# Patient Record
Sex: Female | Born: 1960 | Race: White | Hispanic: No | Marital: Single
Health system: Midwestern US, Academic
[De-identification: ages and names within clinical notes are randomized; demographics above are authoritative.]

## PROBLEM LIST (undated history)

## (undated) DIAGNOSIS — R011 Cardiac murmur, unspecified: Secondary | ICD-10-CM

## (undated) DIAGNOSIS — G4730 Sleep apnea, unspecified: Secondary | ICD-10-CM

## (undated) DIAGNOSIS — M1990 Unspecified osteoarthritis, unspecified site: Secondary | ICD-10-CM

## (undated) DIAGNOSIS — T8859XA Other complications of anesthesia, initial encounter: Secondary | ICD-10-CM

## (undated) DIAGNOSIS — K219 Gastro-esophageal reflux disease without esophagitis: Secondary | ICD-10-CM

## (undated) DIAGNOSIS — Z800 Family history of malignant neoplasm of digestive organs: Secondary | ICD-10-CM

## (undated) DIAGNOSIS — F419 Anxiety disorder, unspecified: Secondary | ICD-10-CM

## (undated) DIAGNOSIS — Z853 Personal history of malignant neoplasm of breast: Secondary | ICD-10-CM

## (undated) DIAGNOSIS — I499 Cardiac arrhythmia, unspecified: Secondary | ICD-10-CM

## (undated) DIAGNOSIS — I341 Nonrheumatic mitral (valve) prolapse: Secondary | ICD-10-CM

## (undated) DIAGNOSIS — E119 Type 2 diabetes mellitus without complications: Secondary | ICD-10-CM

## (undated) DIAGNOSIS — D649 Anemia, unspecified: Secondary | ICD-10-CM

## (undated) DIAGNOSIS — B977 Papillomavirus as the cause of diseases classified elsewhere: Secondary | ICD-10-CM

## (undated) DIAGNOSIS — Z803 Family history of malignant neoplasm of breast: Secondary | ICD-10-CM

## (undated) DIAGNOSIS — I10 Essential (primary) hypertension: Secondary | ICD-10-CM

## (undated) DIAGNOSIS — A64 Unspecified sexually transmitted disease: Secondary | ICD-10-CM

## (undated) DIAGNOSIS — D219 Benign neoplasm of connective and other soft tissue, unspecified: Secondary | ICD-10-CM

## (undated) DIAGNOSIS — D689 Coagulation defect, unspecified: Secondary | ICD-10-CM

## (undated) DIAGNOSIS — M797 Fibromyalgia: Secondary | ICD-10-CM

## (undated) DIAGNOSIS — K589 Irritable bowel syndrome without diarrhea: Secondary | ICD-10-CM

## (undated) DIAGNOSIS — N3090 Cystitis, unspecified without hematuria: Secondary | ICD-10-CM

## (undated) DIAGNOSIS — E039 Hypothyroidism, unspecified: Secondary | ICD-10-CM

## (undated) DIAGNOSIS — K279 Peptic ulcer, site unspecified, unspecified as acute or chronic, without hemorrhage or perforation: Secondary | ICD-10-CM

## (undated) DIAGNOSIS — G90521 Complex regional pain syndrome I of right lower limb: Secondary | ICD-10-CM

## (undated) DIAGNOSIS — E785 Hyperlipidemia, unspecified: Secondary | ICD-10-CM

## (undated) DIAGNOSIS — I82409 Acute embolism and thrombosis of unspecified deep veins of unspecified lower extremity: Secondary | ICD-10-CM

## (undated) DIAGNOSIS — A609 Anogenital herpesviral infection, unspecified: Secondary | ICD-10-CM

## (undated) DIAGNOSIS — E079 Disorder of thyroid, unspecified: Secondary | ICD-10-CM

## (undated) DIAGNOSIS — J45909 Unspecified asthma, uncomplicated: Secondary | ICD-10-CM

## (undated) DIAGNOSIS — C50919 Malignant neoplasm of unspecified site of unspecified female breast: Secondary | ICD-10-CM

## (undated) DIAGNOSIS — K635 Polyp of colon: Secondary | ICD-10-CM

## (undated) HISTORY — PX: DENTAL SURGERY: SHX609

## (undated) HISTORY — PX: TOE SURGERY: SHX1073

## (undated) HISTORY — DX: Anxiety disorder, unspecified: F41.9

## (undated) HISTORY — DX: Personal history of malignant neoplasm of breast: Z85.3

## (undated) HISTORY — DX: Family history of malignant neoplasm of digestive organs: Z80.0

## (undated) HISTORY — DX: Cardiac murmur, unspecified: R01.1

## (undated) HISTORY — DX: Peptic ulcer, site unspecified, unspecified as acute or chronic, without hemorrhage or perforation: K27.9

## (undated) HISTORY — PX: FOOT SURGERY: SHX648

## (undated) HISTORY — DX: Papillomavirus as the cause of diseases classified elsewhere: B97.7

## (undated) HISTORY — PX: COLONOSCOPY: SHX174

## (undated) HISTORY — DX: Complex regional pain syndrome i of right lower limb: G90.521

## (undated) HISTORY — DX: Sleep apnea, unspecified: G47.30

## (undated) HISTORY — DX: Cystitis, unspecified without hematuria: N30.90

## (undated) HISTORY — DX: Anogenital herpesviral infection, unspecified: A60.9

## (undated) HISTORY — DX: Irritable bowel syndrome, unspecified: K58.9

## (undated) HISTORY — DX: Coagulation defect, unspecified: D68.9

## (undated) HISTORY — PX: ESOPHAGOGASTRODUODENOSCOPY: SHX1529

## (undated) HISTORY — DX: Fibromyalgia: M79.7

## (undated) HISTORY — DX: Unspecified osteoarthritis, unspecified site: M19.90

## (undated) HISTORY — DX: Benign neoplasm of connective and other soft tissue, unspecified: D21.9

## (undated) HISTORY — DX: Hyperlipidemia, unspecified: E78.5

## (undated) HISTORY — PX: ABDOMINAL HYSTERECTOMY: SHX81

## (undated) HISTORY — PX: KNEE SURGERY: SHX244

## (undated) HISTORY — PX: BREAST SURGERY: SHX581

## (undated) HISTORY — DX: Family history of malignant neoplasm of breast: Z80.3

## (undated) HISTORY — DX: Polyp of colon: K63.5

## (undated) LAB — HM PAP SMEAR: HM Pap smear: NORMAL

## (undated) LAB — HM COLONOSCOPY

## (undated) LAB — HM DM FOOT MONOFILAMENT AND PULSE: hm dm foot monofilament and pulse: NORMAL

## (undated) LAB — HM DEXA SCAN

## (undated) LAB — HM MAMMOGRAPHY

---
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
## 2004-10-29 LAB — IRON STUDIES
% Iron Saturation: 11 % (ref 20–55)
Iron: 38 ug/dL (ref 35–175)
TIBC: 356 ug/dL (ref 245–400)

## 2004-10-29 LAB — BASIC METABOLIC PANEL
Anion Gap: 13 (ref 3–16)
BUN: 11 mg/dL (ref 7–25)
CO2: 22 mmol/L (ref 21–33)
Calcium: 9.4 mg/dL (ref 8.5–10.4)
Chloride: 105 mmol/L (ref 98–110)
Creatinine: 0.8 mg/dL (ref 0.5–1.2)
Glucose: 108 mg/dL (ref 65–99)
Potassium: 4 mmol/L (ref 3.5–5.3)
Sodium: 140 mmol/L (ref 135–146)

## 2004-10-29 LAB — HEPATIC FUNCTION PANEL
ALT: 37 units/L (ref 3–40)
AST: 26 units/L (ref 3–35)
Albumin: 4.4 g/dL (ref 3.5–4.9)
Alkaline Phosphatase: 70 units/L (ref 20–125)
Bilirubin, Direct: 0.1 mg/dL (ref 0.0–0.3)
Total Bilirubin: 0.8 mg/dL (ref 0.2–1.3)
Total Protein: 7.5 g/dL (ref 6.0–8.3)

## 2004-10-29 LAB — CBC
Hematocrit: 40.2 % (ref 35.0–45.0)
Hemoglobin: 13.3 g/dL (ref 12.0–16.0)
MCH: 31.1 pg (ref 27.0–34.0)
MCV: 93.9 fL (ref 81.0–103.0)
MPV: 10.9 fL (ref 9.5–12.5)
Platelets: 205 10*3/uL (ref 140–400)
RBC: 4.28 10*6/uL (ref 3.90–5.40)
RDW: 12.8 % (ref 11.5–14.5)
WBC: 6.8 10*3/uL (ref 4.5–11.0)

## 2004-10-29 LAB — TSH: TSH: 1.47 m[IU]/mL (ref 0.35–5.50)

## 2004-10-29 LAB — LIPID PANEL
Cholesterol, Total: 148 mg/dL (ref 0–200)
HDL: 47 mg/dL (ref 40–180)
LDL Cholesterol: 69 mg/dL (ref 0–130)
Triglycerides: 158 mg/dL (ref 0–150)

## 2004-10-29 NOTE — Unmapped (Signed)
Signed by   LinkLogic on 10/29/2004 at 23:28:23  Patient: Wanda Rowe  Note: All result statuses are Final unless otherwise noted.    Tests: (1) BASIC METABOLIC PANEL, FASTING (EP1F)    Sodium                    140 mmol/L                  135-146    Potassium                 4.0 mmol/L                  3.5-5.3    Chloride                  105 mmol/L                  98-110    CO2                       22 mmol/L                   21-33    Anion Gap                 13 mEq/L                    3-16    BUN                       11 mg/dL                    4-40    Creatinine                0.8 mg/dL                   1.0-2.7    Glucose              [H]  108 mg/dL                   25-36    Calcium                   9.4 mg/dL                   6.4-40.3  ! GFR MDRD Af Amer          101 See Note      GFR is estimated using Creatinine, age, gender and race. Patient's values   should be interpreted as a trend.      Below 90 ml/min/1.56m2, the patient may have renal disease.       For additional information:      www.[email or web address].org and [email or web address]    ! GFR MDRD Non Af Amer      83 See Note      GFR is estimated using Creatinine, age, gender and race. Patient's values   should be interpreted as a trend.      Between 30 and 90 ml/min/1.65m2, clinical correlation is needed.     For additional information:     www.kidney.org and [email or web address]    ! 1/Creatinine              1.25    Note: An exclamation mark (!) indicates a result that was not dispersed into   the flowsheet.  Document Creation Date: 10/29/2004 11:28 PM  _______________________________________________________________________    (1) Order result status: Final  Collection or observation date-time: 10/29/2004 09:38  Requested date-time: 10/29/2004 09:38  Receipt date-time: 10/29/2004 19:33  Reported date-time: 10/29/2004 23:30  Referring Physician:    Ordering Physician: Latrelle Bazar (HOVERMAK)  Specimen Source: S&SERUM     SST REFRIG&SST (Refrig)  Source:  Butler [place] Order Number: [id number]  Lab site: [facility]      [address]      [place] [address]  [phone]

## 2004-10-29 NOTE — Unmapped (Signed)
Signed by   LinkLogic on 10/29/2004 at 23:28:22  Patient: Wanda Rowe  Note: All result statuses are Final unless otherwise noted.    Tests: (1) IRON STUDIES (IS)    Iron                      38 mcg/dL                   95-621    TIBC                      356 ug/dL                   308-657    %SAT                 [L]  11 %                        20-55    Note: An exclamation mark (!) indicates a result that was not dispersed into   the flowsheet.  Document Creation Date: 10/29/2004 11:28 PM  _______________________________________________________________________    (1) Order result status: Final  Collection or observation date-time: 10/29/2004 09:38  Requested date-time: 10/29/2004 09:38  Receipt date-time: 10/29/2004 19:33  Reported date-time: 10/29/2004 23:30  Referring Physician:    Ordering Physician: Steaven Wholey (HOVERMAK)  Specimen Source: S&SERUM     SST REFRIG&SST (Refrig)  Source: Butler Denmark Order Number: [id number]  Lab site: [facility]      [address]      [place] [address]  [phone]

## 2004-10-29 NOTE — Unmapped (Signed)
Signed by   LinkLogic on 10/29/2004 at 23:28:20  Patient: Wanda Rowe  Note: All result statuses are Final unless otherwise noted.    Tests: (1) HEPATIC FUNCTION PANEL (LIVP)    BILI, Total               0.8 mg/dL                   7.8-2.9    BILI, Direct              0.1 mg/dL                   5.6-2.1    AST (SGOT)                26 U/L                      3-35    ALT (SGPT)                37 U/L                      3-40    Alk Phosphatase           70 U/L                      20-125    Protein, Total            7.5 g/dL                    3.0-8.6    Albumin                   4.4 g/dL                    5.7-8.4    Note: An exclamation mark (!) indicates a result that was not dispersed into   the flowsheet.  Document Creation Date: 10/29/2004 11:28 PM  _______________________________________________________________________    (1) Order result status: Final  Collection or observation date-time: 10/29/2004 09:38  Requested date-time: 10/29/2004 09:38  Receipt date-time: 10/29/2004 19:33  Reported date-time: 10/29/2004 23:30  Referring Physician:    Ordering Physician: Tyson Masin (HOVERMAK)  Specimen Source: S&SERUM     SST REFRIG&SST (Refrig)  Source: Butler Denmark Order Number: [id number]  Lab site: [facility]      [address]      [place] [address]  [phone]

## 2004-10-29 NOTE — Unmapped (Signed)
Signed by   LinkLogic on 10/30/2004 at 01:44:03  Patient: Wanda Rowe  Note: All result statuses are Final unless otherwise noted.    Tests: (1) THYROID STIMULATING HORMONE (TSH)    TSH                       1.47 MIU/mL                 0.35-5.50    Note: An exclamation mark (!) indicates a result that was not dispersed into   the flowsheet.  Document Creation Date: 10/30/2004 1:44 AM  _______________________________________________________________________    (1) Order result status: Final  Collection or observation date-time: 10/29/2004 09:38  Requested date-time: 10/29/2004 09:38  Receipt date-time: 10/29/2004 19:33  Reported date-time: 10/30/2004 01:46  Referring Physician:    Ordering Physician: Darlen Gledhill (HOVERMAK)  Specimen Source: S&SERUM     SST REFRIG&SST (Refrig)  Source: Butler Denmark Order Number: [id number]  Lab site: [facility]      [address]      [place] [address]  [phone]

## 2004-10-29 NOTE — Unmapped (Signed)
Signed by   LinkLogic on 10/29/2004 at 23:28:21  Patient: Wanda Rowe  Note: All result statuses are Final unless otherwise noted.    Tests: (1) LIPID PROFILE (FATS)    Cholesterol               148 mg/dL                   2-725    Triglyceride         [H]  158 mg/dL                   3-664    HDL                       47 mg/dL                    40-347    LDL, calc                 69 mg/dL                    4-259    Note: An exclamation mark (!) indicates a result that was not dispersed into   the flowsheet.  Document Creation Date: 10/29/2004 11:28 PM  _______________________________________________________________________    (1) Order result status: Final  Collection or observation date-time: 10/29/2004 09:38  Requested date-time: 10/29/2004 09:38  Receipt date-time: 10/29/2004 19:33  Reported date-time: 10/29/2004 23:30  Referring Physician:    Ordering Physician: Karlynn Furrow (HOVERMAK)  Specimen Source: S&SERUM     SST REFRIG&SST (Refrig)  Source: Butler Denmark Order Number: [id number]  Lab site: [facility]      [address]      [place] [address]  [phone]

## 2004-10-29 NOTE — Unmapped (Signed)
Signed by   LinkLogic on 10/29/2004 at 16:43:14  Patient: Wanda Rowe  Note: All result statuses are Final unless otherwise noted.    Tests: (1) CBC (CBC)    WBC                       6.8 10*3/uL                 4.5-11.0    RBC                       4.28 10*6/uL                3.90-5.40    Hgb                       13.3 g/dL                   84.1-32.4    HCT                       40.2 %                      35.0-45.0    MCV                       93.9 fL                     81.0-103.0    MCH                       31.1 pg                     27.0-34.0    MCHC                      33.1 g/dL                   40.1-02.7    RDW                       12.8 %                      11.5-14.5    Platelet Count            205 10*3/uL                 140-400    MPV                       10.9 fL                     9.5-12.5    Note: An exclamation mark (!) indicates a result that was not dispersed into   the flowsheet.  Document Creation Date: 10/29/2004 4:43 PM  _______________________________________________________________________    (1) Order result status: Final  Collection or observation date-time: 10/29/2004 09:38  Requested date-time: 10/29/2004 09:38  Receipt date-time: 10/29/2004 14:11  Reported date-time: 10/29/2004 16:45  Referring Physician:    Ordering Physician: Artis Buechele (HOVERMAK)  Specimen Source: WB&WHOLE BLOOD     LV5  REFRIG&LAV  (Refrig)  Source: Butler Denmark Order Number: [id number]  Lab site: [facility]      [address]  [place] [address]  [phone]      -----------------    The following results were not dispersed to the flowsheet  because of errors during the import process:      MCHC, 33.1 g/dL, (F)

## 2005-06-16 LAB — HEPATIC FUNCTION PANEL
A/G Ratio: 1.5 (ref 1.0–2.1)
ALT: 36 units/L (ref 6–40)
AST: 31 units/L (ref 10–30)
Albumin: 4.6 g/dL (ref 3.6–5.1)
Alkaline Phosphatase: 67 units/L (ref 33–115)
Bilirubin, Direct: 0.1 mg/dL (ref <=0.2)
Bilirubin, Indirect: 0.6 mg/dL (ref 0.2–1.2)
Globulin, Total: 3 g/dL (ref 2.2–3.9)
Total Bilirubin: 0.7 mg/dL (ref 0.2–1.2)
Total Protein: 7.6 g/dL (ref 6.2–8.3)

## 2005-06-16 LAB — LIPID PANEL
Chol/HDL Ratio: 3.7 (ref <=5.0)
Cholesterol, Total: 179 mg/dL (ref 125–200)
HDL: 48 mg/dL (ref >=40)
LDL Cholesterol: 98 mg/dL (ref <130)
Triglycerides: 164 mg/dL (ref <150)

## 2005-06-16 LAB — BASIC METABOLIC PANEL
BUN/Creatinine Ratio: 16 (ref 6–22)
BUN: 13 mg/dL (ref 7–25)
CO2: 28 mmol/L (ref 21–33)
Calcium: 9.5 mg/dL (ref 8.6–10.2)
Chloride: 102 mmol/L (ref 98–110)
Creatinine: 0.8 mg/dL (ref 0.5–1.2)
GFR MDRD Non Af Amer: >60 mL/min (ref >=60)
Glucose: 107 mg/dL — ABNORMAL HIGH (ref 65–99)
Potassium: 4 mmol/L (ref 3.5–5.3)
Sodium: 140 mmol/L (ref 135–146)

## 2005-06-16 LAB — CK, CARDIAC ENZYME: Total CK: 113 units/L (ref <=165)

## 2005-06-16 LAB — INSULIN: Insulin: 14 microintl units/mL (ref <17)

## 2005-06-16 NOTE — Unmapped (Signed)
Signed by   LinkLogic on 06/18/2005 at 07:21:32  Patient: Wanda Rowe  Note: All result statuses are Final unless otherwise noted.    Tests: (1) CARDIO CRP ([id number])  ! CARDIO CRP           [H]  26.0 mg/L             PERSISTENT ELEVATIONS MAY REPRESENT NON-CARDIOVASCULAR       INFLAMMATION.             FOR AGES > 17 YEARS:             CCRP MG/L    RISK ACCORDING TO AHA/CDC GUIDELINES        ---------    ------------------------------------      <1.0         LOW CARDIOVASCULAR RISK      1.0-3.0      AVERAGE CARDIOVASCULAR RISK      3.1-10.0     HIGH CARDIOVASCULAR RISK      >10.0        PERSISTENT ELEVATIONS MAY REPRESENT                    NON-CARDIOVASCULAR INFLAMMATION           Note: An exclamation mark (!) indicates a result that was not dispersed into   the flowsheet.  Document Creation Date: 06/18/2005 7:21 AM  _______________________________________________________________________    (1) Order result status: Final  Collection or observation date-time: 06/16/2005 13:07  Requested date-time:   Receipt date-time: 06/16/2005 23:37  Reported date-time: 06/18/2005 07:00  Referring Physician:    Ordering Physician: Lauriann Milillo (HOVERMAK)  Specimen Source: S  Source: Lucien Mons Order Number: [id number]  Lab site: [facility] [place]      [address]      [place]  [place]  [address]

## 2005-06-16 NOTE — Unmapped (Signed)
Signed by   LinkLogic on 06/18/2005 at 07:21:29  Patient: Wanda Rowe  Note: All result statuses are Final unless otherwise noted.    Tests: (1) BASIC METABOLIC PANEL W/EGFR (QDL-10165)    GLUCOSE              [H]  107 mg/dL                   78-29                  FASTING REFERENCE INTERVAL    UREA NITROGEN (BUN)       13 mg/dL                    5-62    CREATININE                0.8 mg/dL                   1.3-0.8    GFR ESTIMATED             >60 mL/min/1.12m2           > OR = 60      IF THE PATIENT IS AFRICAN-AMERICAN, PLEASE MULTIPLY      THIS RESULT BY 1.21. THIS RESULT HAS BEEN CALCULATED      ASSUMING THE PATIENT IS NON-AFRICAN AMERICAN.    BUN/CREATININE RATIO (calc)                              16                          6-22    SODIUM                    140 mmol/L                  135-146    POTASSIUM                 4.0 mmol/L                  3.5-5.3    CHLORIDE                  102 mmol/L                  98-110    CARBON DIOXIDE            28 mmol/L                   21-33    CALCIUM                   9.5 mg/dL                   6.5-78.4    Note: An exclamation mark (!) indicates a result that was not dispersed into   the flowsheet.  Document Creation Date: 06/18/2005 7:21 AM  _______________________________________________________________________    (1) Order result status: Final  Collection or observation date-time: 06/16/2005 13:07  Requested date-time:   Receipt date-time: 06/16/2005 23:37  Reported date-time: 06/18/2005 07:00  Referring Physician:    Ordering Physician: Braxden Lovering (HOVERMAK)  Specimen Source: S  Source: Lucien Mons Order Number: [id number]  Lab site: [facility] [place]      [address]    [place]  [address]

## 2005-06-16 NOTE — Unmapped (Signed)
Signed by   LinkLogic on 06/18/2005 at 07:21:30  Patient: Wanda Rowe  Note: All result statuses are Final unless otherwise noted.    Tests: (1) HEPATIC FUNCTION PANEL (QDL-10256)    PROTEIN, TOTAL            7.6 g/dL                    4.6-9.6    ALBUMIN                   4.6 g/dL                    2.9-5.2    GLOBULIN (calc)           3.0 g/dL                    8.4-1.3   ALBUMIN/GLOBULIN RATIO (calc)                              1.5                         1.0-2.1    BILIRUBIN, TOTAL          0.7 mg/dL                   2.4-4.0    BILIRUBIN, DIRECT         0.1 mg/dL                   < OR = 0.2   BILIRUBIN, INDIRECT (calc)                              0.6 mg/dL                   1.0-2.7    ALKALINE PHOSPHATASE      67 U/L                      33-115    AST                  [H]  31 U/L                      10-30    ALT                       36 U/L                      6-40    Note: An exclamation mark (!) indicates a result that was not dispersed into   the flowsheet.  Document Creation Date: 06/18/2005 7:21 AM  _______________________________________________________________________    (1) Order result status: Final  Collection or observation date-time: 06/16/2005 13:07  Requested date-time:   Receipt date-time: 06/16/2005 23:37  Reported date-time: 06/18/2005 07:00  Referring Physician:    Ordering Physician: Trenten Watchman (HOVERMAK)  Specimen Source: S  Source: Lucien Mons Order Number: [id number]  Lab site: [facility] [place]      [address]      [place]  [place]  [address]

## 2005-06-16 NOTE — Unmapped (Signed)
Signed by   LinkLogic on 06/18/2005 at 07:21:33  Patient: Wanda Rowe  Note: All result statuses are Final unless otherwise noted.    Tests: (1) INSULIN (QDL-561)    INSULIN                   14 uIU/mL                   <17    Note: An exclamation mark (!) indicates a result that was not dispersed into   the flowsheet.  Document Creation Date: 06/18/2005 7:21 AM  _______________________________________________________________________    (1) Order result status: Final  Collection or observation date-time: 06/16/2005 13:07  Requested date-time:   Receipt date-time: 06/16/2005 23:37  Reported date-time: 06/18/2005 07:00  Referring Physician:    Ordering Physician: Salimah Martinovich (HOVERMAK)  Specimen Source: S  Source: Lucien Mons Order Number: [id number]  Lab site: [facility] [place]      [address]      [place]  [place]  [address]

## 2005-06-16 NOTE — Unmapped (Signed)
Signed by   LinkLogic on 06/18/2005 at 07:21:28  Patient: Wanda Rowe  Note: All result statuses are Final unless otherwise noted.    Tests: (1) LIPID PANEL (QDL-7600)    TRIGLYCERIDES        [H]  164 mg/dL                   <956    CHOLESTEROL, TOTAL        179 mg/dL                   387-564    HDL CHOLESTEROL           48 mg/dL                    > OR = 40   LDL-CHOLESTEROL (calc)                              98 mg/dL                    <332             DESIRABLE RANGE <100 MG/DL FOR PATIENTS WITH CHD OR      DIABETES AND <70 MG/DL FOR DIABETIC PATIENTS WITH      KNOWN HEART DISEASE.          CHOL/HDLC RATIO (calc)                              3.7                         < OR = 5.0    Note: An exclamation mark (!) indicates a result that was not dispersed into   the flowsheet.  Document Creation Date: 06/18/2005 7:21 AM  _______________________________________________________________________    (1) Order result status: Final  Collection or observation date-time: 06/16/2005 13:07  Requested date-time:   Receipt date-time: 06/16/2005 23:37  Reported date-time: 06/18/2005 07:00  Referring Physician:    Ordering Physician: Ellason Segar (HOVERMAK)  Specimen Source: S  Source: Lucien Mons Order Number: [id number]  Lab site: [facility] [place]      [address]      [place]  [place]  [address]

## 2005-06-16 NOTE — Unmapped (Signed)
Signed by   LinkLogic on 06/18/2005 at 07:21:31  Patient: Wanda Rowe  Note: All result statuses are Final unless otherwise noted.    Tests: (1) CREATINE KINASE, TOTAL (QDL-374)   CREATINE KINASE, TOTAL                              113 U/L                     < OR = 165    Note: An exclamation mark (!) indicates a result that was not dispersed into   the flowsheet.  Document Creation Date: 06/18/2005 7:21 AM  _______________________________________________________________________    (1) Order result status: Final  Collection or observation date-time: 06/16/2005 13:07  Requested date-time:   Receipt date-time: 06/16/2005 23:37  Reported date-time: 06/18/2005 07:00  Referring Physician:    Ordering Physician: Lexandra Rettke (HOVERMAK)  Specimen Source: S  Source: Lucien Mons Order Number: [id number]  Lab site: [facility] [place]      [address]      [place]    [address]

## 2005-08-24 NOTE — Unmapped (Signed)
Signed by Epifanio Lesches MA on 08/24/2005 at 16:26:06    Phone Note   Patient Call  Call back at Work Phone: [phone]  Caller: patient  Department: IM -Gastroenterology  Call for: [facility]    Summary of Call: NEED TO SCHED ENDOSCOPY....................     Initial call taken by: Verlene Mayer,  August 24, 2005 10:56 AM    This department is not currently using the EMR system. Please see the paper chart for this patient for the response to this phone message.      Follow-up for Phone Call   left message to call us back  Follow-up by: Epifanio Lesches MA,  August 24, 2005 4:26 PM

## 2005-09-29 ENCOUNTER — Inpatient Hospital Stay

## 2005-09-29 NOTE — Unmapped (Signed)
[facility] PATIENT NAMEKOREE, Wanda Rowe                       MR #:  [id number] DATE OF BIRTH:  Mar 11, 1960                           ACCOUNT #:  [id number] PHYSICIAN:      Lurene Shadow, M.D.           ROOM #: SERVICE:        Internal   Medicine/Digestive Disease NURSING UNIT: DICTATED BY:    Lurene Shadow,   M.D.           [facility]:  S PROCEDURE DATE: 09/29/2005                         ADMIT   DATE:  09/29/2005                                                      DISCHARGE DATE:                                ENDOSCOPY REPORT HISTORY:  This   is a 45 year old woman with a history of hyperplastic gastric polyps   including a large antral polyp that was removed in [place] a few years ago   that revealed some focal high grade dysplasia.  After extensive discussion in   a recent office visit regarding risk for hyperplastic polyps and adenomatous   transformation usually occurring in the context of a chronic gastritis, we   decided to proceed with upper endoscopy to evaluate for any residual polyps as   well as underlying gastritis. ANESTHESIA:  Sedation was administered using 50   mg of IV Benadryl 5 mg of IV Midazolam and 75 mcg of IV fentanyl. DETAILS OF   PROCEDURE:  A gastroscope was passed from the mouth as far as the second   portion of the duodenum.  The esophagus is normal.  The stomach has mild   diffuse gastritis and biopsies were taken to rule out H. pylori versus   chemical gastropathy.  A 3 mm polyp was noted in the fundus which was removed   with forceps.  Also, for histopathologic evaluation.  There is some scar in   the antrum corresponding to a previous polypectomy.  The proximal duodenum is   normal and the major papilla appears normal.  I will mention that at the   beginning of the case gastric secretions were aspirated for pH testing and pH   evaluation using ___ paper is approximately 3, arguing against pernicious   anemia. IMPRESSION: 1.   Mild  gastritis with one tiny polyp.  pH is 3 biopsy   results are pending.    Will plan to see the patient back in the office.                                         ________________________________________   NS/jlm  ____ D:  09/29/2005 10:11                    Lurene Shadow, M.D. T:  09/29/2005 23:00 Job #:  [id number] c:   Bani K.   Hovermale, M.D.                                ENDOSCOPY REPORT                                         COPY                   Page    1 of 1 NS/jlm                                  ____ D:  09/29/2005 10:11                  Lurene Shadow, M.D. T:  09/29/2005 23:00 Job #:  [id number] c:   Salina K. Hovermale,   M.D.                                ENDOSCOPY REPORT                                         COPY                   Page    1 of 1

## 2005-09-29 NOTE — Unmapped (Signed)
Signed by   LinkLogic on 09/29/2005 at 23:04:05  Patient: Wanda Rowe  Note: All result statuses are Final unless otherwise noted.    Tests: (1)  (MR)    Order Note:                                 [facility]     PATIENT NAMEKHAMORA, [name]                       MR #:  [id number]  DATE OF BIRTH:  [birth year]                         ACCOUNT #:  [id number]  PHYSICIAN:      Lurene Shadow, M.D.           ROOM #:  SERVICE:        Internal Medicine/Digestive Disease NURSING UNIT:  DICTATED BY:    Lurene Shadow, M.D.           [facility]:  S  PROCEDURE DATE: 09/29/2005                         ADMIT DATE:  09/29/2005                                                     DISCHARGE DATE:                                    ENDOSCOPY REPORT     HISTORY:  This is a 45 year old woman with a history of hyperplastic gastric  polyps including a large antral polyp that was removed in [place] a few  years ago that revealed some focal high grade dysplasia.  After extensive  discussion in a recent office visit regarding risk for hyperplastic polyps  and adenomatous transformation usually occurring in the context of a chronic  gastritis, we decided to proceed with upper endoscopy to evaluate for any  residual polyps as well as underlying gastritis.     ANESTHESIA:  Sedation was administered using 50 mg of IV Benadryl 5 mg of IV  Midazolam and 75 mcg of IV fentanyl.     DETAILS OF PROCEDURE:  A gastroscope was passed from the mouth as far as the  second portion of the duodenum.  The esophagus is normal.  The stomach has  mild diffuse gastritis and biopsies were taken to rule out H. pylori versus  chemical gastropathy.  A 3 mm polyp was noted in the fundus which was removed  with forceps.  Also, for histopathologic evaluation.  There is some scar in  the antrum corresponding to a previous polypectomy.  The proximal duodenum is  normal and the major papilla appears normal.  I will mention that at the  beginning of the  case gastric secretions were aspirated for pH testing and pH  evaluation using ___ paper is approximately 3, arguing against pernicious  anemia.     IMPRESSION:     1.   Mild gastritis with one tiny polyp.  pH is 3 biopsy results are pending.  Will plan to see the patient back in the office.                                                                   ________________________________________  NS/jlm                                ____  D:  09/29/2005 10:11                  Lurene Shadow, M.D.  T:  09/29/2005 23:00  Job #:  [id number]  c:   Katharina K. Hovermale, M.D.                                    ENDOSCOPY REPORT                                        COPY                   Page    1 of 1    Note: An exclamation mark (!) indicates a result that was not dispersed into   the flowsheet.  Document Creation Date: 09/29/2005 11:04 PM  _______________________________________________________________________    (1) Order result status: Final  Collection or observation date-time: 09/29/2005 00:00  Requested date-time:   Receipt date-time:   Reported date-time:   Referring Physician: Jerrel Ivory  Ordering Physician:  [facility]ed In [facility])  Specimen Source:   Source: DBS  Filler Order Number: [id number]  Lab site:

## 2005-10-23 NOTE — Unmapped (Signed)
Signed by Lolita Patella. Gerrety RMA on 10/23/2005 at 08:09:46      Preload Clinical Lists   Problems added:   ASTHMA (ICD-493.90)  HYPERGLYCEMIA (ICD-790.6)  HYPERTRIGLYCERIDEMIA (ICD-272.1)  ANXIETY (ICD-300.00)  HYPERCHOLESTEROLEMIA (ICD-272.0)  HYPERTENSION (ICD-401.9)    Medications added:   LISINOPRIL 10 MG TABS (LISINOPRIL) one by mouth daily  LOVASTATIN 20 MG TABS (LOVASTATIN) one by mouth daily  SINGULAIR 10 MG TAB (MONTELUKAST SODIUM) one by mouth at bedtime  NEURONTIN 300 MG CAPS (GABAPENTIN) one by mouth twice a day  FLONASE 50 MCG/ACT SUSPN (FLUTICASONE PROPIONATE (NASAL)) two sprays each nostril at bedtime  ATROVENT 0.03 % SOLN (IPRATROPIUM BROMIDE)   BUSPAR 30 MG TABS (BUSPIRONE HCL) 1 by mouth every am      Past History  Surgical History:  ACL Repair: x3 left knee, Meniscal Repair: x3 left knee, Peridontal surgery        Preventive Maintenance

## 2005-10-26 NOTE — Unmapped (Signed)
Signed by Carlene Coria CMA on 10/26/2005 at 14:31:23    Phone Note   Patient Call  Call back at Home Phone: [phone]  [facility] Cell Phone #: [phone]  Caller: patient  Department: IM - General  Call for: dr Rico Junker    Pharm[facility] Information: [facility] [phone]   Summary of Call: patient is out of augumentin 875 millgrams 2 times a day. for 10 days patient still has a sinus infection what do you recommend pain and pressure on forehead ears are block nose is block call patient please      Initial call taken by: Avanell Shackleton,  October 26, 2005 9:20 AM      New Medications:  Christena Deem PACK (AZITHROMYCIN PACK) as dir    Follow-up for Phone Call   provider notified  Follow-up by: Carlene Coria MA,  October 26, 2005 9:26 AM    Additional Follow-up for Phone Call   Z-pack  Additional Follow-up by: Irena Cords MD,  October 26, 2005 2:08 PM    Additional Follow-up for Phone Call   phone call completed, Rx completed  Additional Follow-up by: Carlene Coria MA,  October 26, 2005 2:28 PM

## 2005-11-03 NOTE — Unmapped (Signed)
Signed by Irena Cords MD on 11/03/2005 at 00:00:00  Consultation Report      Imported By: Addison Naegeli 11/19/2005 09:47:07    _____________________________________________________________________    External Attachment:    Please see [employer] EMR for this document.

## 2005-11-03 NOTE — Unmapped (Signed)
Signed by Irena Cords MD on 11/03/2005 at 00:00:00  Gastroenterology      Imported By: Vance Peper 11/19/2005 09:53:21    _____________________________________________________________________    External Attachment:    Please see [facility] EMR for this document.

## 2006-01-12 NOTE — Unmapped (Signed)
Signed by Carlene Coria CMA on 01/13/2006 at 08:06:52    Phone Note   Patient Call  Call back at Home Phone: [phone]  Caller: patient  Department: IM - General  Call for: DR [facility]    Summary of Call: CHANGE OF BP MEDS...BUT IT IS OUT OF CONTROL...UP DOSAGE OR BACK TO OLD MEDS (ATACAND-8 MG) - LOST PRESCRIPT FOR BLOOD WORK...CAN SHE PICK UP ANOTHER....TOMORROW? PLEASE CALL AND ADVISE     Initial call taken by: Nolon Stalls,  January 12, 2006 9:33 AM      Follow-up for Phone Call   appt wed  Follow-up by: Irena Cords MD,  January 12, 2006 2:33 PM    Additional Follow-up for Phone Call   Left message to call back to sched an appt.  Additional Follow-up by: Chesley Mires CMA,  January 12, 2006 3:38 PM

## 2006-01-13 NOTE — Unmapped (Signed)
Signed by Carlene Coria CMA on 01/13/2006 at 09:30:52    Phone Note   Patient Call  Caller: patient  Department: IM - General  Call for: Arlet Marter    Summary of Call: SINCE WE CALLED PT YESTERDAY FOR HER TO COME IN TODAY WHERE DOES DR H WANT TO DOUBLE BOOK.  CAN ONLY COME IN THIS AM, PT CANNOT COME IN FROM 12:OO ON.   CALL PT  ON HER CELL PHONE    [phone].      Initial call taken by: Rochele Raring,  January 13, 2006 9:03 AM      Follow-up for Phone Call   phone call completed, appointment scheduled today  Follow-up by: Carlene Coria CMA,  January 13, 2006 9:30 AM

## 2006-01-13 NOTE — Unmapped (Signed)
Signed by Irena Cords MD on 01/14/2006 at 08:20:30      Reason for Visit   Chief Complaint: bp prob    History from: patient    Allergies  No Known Allergies    Medications   LISINOPRIL 10 MG TABS (LISINOPRIL) one by mouth daily  LOVASTATIN 20 MG TABS (LOVASTATIN) one by mouth daily  SINGULAIR 10 MG TAB (MONTELUKAST SODIUM) one by mouth at bedtime  NEURONTIN 300 MG CAPS (GABAPENTIN) one by mouth twice a day  FLONASE 50 MCG/ACT SUSPN (FLUTICASONE PROPIONATE (NASAL)) two sprays each nostril at bedtime  ATROVENT 0.03 % SOLN (IPRATROPIUM BROMIDE)   BUSPAR 30 MG TABS (BUSPIRONE HCL) 1 by mouth every am        Vital Signs:   Wt: 293 lbs.      LMP: 12/30/2005  BP: 140/90    Intake recorded by: Carlene Coria CMA on January 13, 2006 11:57 AM        HPI Multiple Chronic   Comments: on new med x 6 weeks lisinopril 10 mg changed from atacand and bp going up, took 2 today.     Current Status:   Home BP monitoring yes  Systolic Range: 140-159  Diastolic Range: 90-100          Physical Examination:   BP: 140/  90    Physical Exam- Detail:   General Appearance: well-developed, well-nourished and in no acute distress.  Respiratory: Respiration un-labored.  Lung fields clear to auscultation.  No wheezing, rales, rhonchi or pleural rub.  Cardiac: S1 and S2 normal.  RRR without murmurs, rubs, gallops.  No JVD.        New Problems:  LEG CRAMPS (ICD-729.82)  New Medications:  LISINOPRIL-HYDROCHLOROTHIAZIDE 20-12.5 MG TABS (LISINOPRIL-HYDROCHLOROTHIAZIDE) 1 qd  CO Q10 100 MG TABS (COENZYME Q10) 1 qd      Preventive Maintenance        Assessment and Plan  Status of Existing Problems:  Assessed HYPERTENSION as deteriorated - change to lisinopril hctz - Demaya Hardge K Tanazia Achee MD - Signed  Assessed LEG CRAMPS as new - may be related to lovastatin started 2 mo ago start co q10 100mg  - Juley Giovanetti K Ceylin Dreibelbis MD - Signed  New Problems:  Dx of LEG CRAMPS (ICD-729.82)  Onset: 01/13/2006    Medications   New Prescriptions/Refills:  CO Q10 100 MG TABS  (COENZYME Q10) 1 qd  #30 x 0 : Karliah Kowalchuk K Simmie Camerer MD (01/13/2006)  LISINOPRIL-HYDROCHLOROTHIAZIDE 20-12.5 MG TABS (LISINOPRIL-HYDROCHLOROTHIAZIDE) 1 qd  #30 x 3 : Adylin Hankey K Gohan Collister MD (01/13/2006)    New medications:  LISINOPRIL-HYDROCHLOROTHIAZIDE 20-12.5 MG TABS -- 1 qd  Start date: 01/13/2006  CO Q10 100 MG TABS -- 1 qd  Start date: 01/13/2006      Today's Orders   Lipid Profile   (FATS) (7600) [CPT-80061]  CMP   (METAPNL) (10231) [CPT-80053]  B-12  (B12)  (927) [CPT-82607]  TSH   (TSH) (899) [CPT-84443]  Homocystine [CPT-83090]  C-Reactive Protein, High Sensitivity    (CRPHS) (10124) [CPT-86141]  99213 - Ofc Vst, Est Level III [[id number]]            Prescriptions:  CO Q10 100 MG TABS (COENZYME Q10) 1 qd  #30 x 0   Entered and Authorized by: Donyell Ding Johnn Hai MD   Signed by: Irena Cords MD on 01/13/2006   Method used: Print then Give to Patient  LISINOPRIL-HYDROCHLOROTHIAZIDE 20-12.5 MG TABS (LISINOPRIL-HYDROCHLOROTHIAZIDE) 1 qd  #30 x 3   Entered and  Authorized by: Evyn Kooyman Johnn Hai MD   Signed by: Irena Cords MD on 01/13/2006   Method used: Print then Give to Patient

## 2006-01-30 LAB — COMPREHENSIVE METABOLIC PANEL
A/G Ratio: 1.6 (ref 1.0–2.1)
ALT: 45 units/L (ref 6–40)
AST: 34 units/L (ref 10–35)
Albumin: 4.7 g/dL (ref 3.6–5.1)
Alkaline Phosphatase: 65 units/L (ref 33–115)
BUN/Creatinine Ratio: 25 (ref 6–22)
BUN: 20 mg/dL (ref 7–25)
CO2: 26 mmol/L (ref 21–33)
Calcium: 10.2 mg/dL (ref 8.6–10.2)
Chloride: 102 mmol/L (ref 98–110)
Creatinine: 0.8 mg/dL (ref 0.50–1.20)
GFR MDRD Non Af Amer: >60 mL/min (ref >=60)
Globulin, Total: 3 g/dL (ref 2.2–3.9)
Glucose: 141 mg/dL (ref 65–99)
Potassium: 4.3 mmol/L (ref 3.5–5.3)
Sodium: 140 mmol/L (ref 135–146)
Total Bilirubin: 0.4 mg/dL (ref 0.2–1.2)
Total Protein: 7.7 g/dL (ref 6.2–8.3)

## 2006-01-30 LAB — HOMOCYSTEINE, SERUM: Homocysteine: 6.6 micromoles/L (ref <10.4)

## 2006-01-30 LAB — LIPID PANEL
Chol/HDL Ratio: 4.5 (ref <=5.0)
Cholesterol, Total: 213 mg/dL (ref 125–200)
HDL: 47 mg/dL (ref >=40)
LDL Cholesterol: 109 mg/dL (ref <130)
Triglycerides: 285 mg/dL (ref <150)

## 2006-01-30 LAB — VITAMIN B12: Vitamin B-12: 400 pg/mL (ref 200–1100)

## 2006-01-30 LAB — TSH: TSH: 2.02 microintl units/mL

## 2006-01-30 NOTE — Unmapped (Signed)
Signed by Vira Browns MD on 02/03/2006 at 10:34:57  Patient: Wanda Rowe  Note: All result statuses are Final unless otherwise noted.    Tests: (1) VITAMIN B12 (QDL-927)    VITAMIN B12               400 pg/mL                   [phone]    Note: An exclamation mark (!) indicates a result that was not dispersed into   the flowsheet.  Document Creation Date: 01/30/2006 10:12 PM  _______________________________________________________________________    (1) Order result status: Final  Collection or observation date-time: 01/30/2006 10:28  Requested date-time:   Receipt date-time: 01/30/2006 14:14  Reported date-time: 01/30/2006 22:00  Referring Physician:    Ordering Physician: Bentley Fissel (HOVERMAK)  Specimen Source: S  Source: [name] Order Number: [id number]  Lab site: [facility] [place]      [address]      [place]  [place]  [address]

## 2006-01-30 NOTE — Unmapped (Signed)
Signed by Vira Browns MD on 02/03/2006 at 10:34:27  Patient: Wanda Rowe  Note: All result statuses are Final unless otherwise noted.    Tests: (1) CARDIO CRP (ZHY-86578)  ! CARDIO CRP                2.7 mg/L             AVERAGE CARDIOVASCULAR RISK ACCORDING TO AHA/CDC       GUIDELINES.              FOR AGES > 17 YEARS:             CCRP MG/L    RISK ACCORDING TO AHA/CDC GUIDELINES        ---------    ------------------------------------      <1.0         LOW CARDIOVASCULAR RISK      1.0-3.0      AVERAGE CARDIOVASCULAR RISK      3.1-10.0     HIGH CARDIOVASCULAR RISK      >10.0        PERSISTENT ELEVATIONS MAY REPRESENT                    NON-CARDIOVASCULAR INFLAMMATION           Note: An exclamation mark (!) indicates a result that was not dispersed into   the flowsheet.  Document Creation Date: 01/31/2006 2:31 PM  _______________________________________________________________________    (1) Order result status: Final  Collection or observation date-time: 01/30/2006 10:28  Requested date-time:   Receipt date-time: 01/30/2006 14:14  Reported date-time: 01/31/2006 14:00  Referring Physician:    Ordering Physician: Dantrell Schertzer (HOVERMAK)  Specimen Source: S  Source: [name] Order Number: [id number]  Lab site: [facility]       [address]      [place]  [place]  [address]

## 2006-01-30 NOTE — Unmapped (Addendum)
Signed by Kaylyn Layer MD on 02/01/2006 at 13:47:23  Patient: Wanda Rowe  Note: All result statuses are Final unless otherwise noted.    Tests: (1) COMPREHENSIVE METABOLIC PANEL W/EGFR (QDL-10231)    GLUCOSE              [H]  141 mg/dL                   54-09                  FASTING REFERENCE INTERVAL    UREA NITROGEN (BUN)       20 mg/dL                    8-11    CREATININE                0.8 mg/dL                   0.50-1.20   EGFR NON-AFR. AMERICAN                              >60 mL/min/1.39m2           > OR = 60  ! EGFR AFRICAN AMERICAN                              >60 mL/min/1.47m2           > OR = 60   BUN/CREATININE RATIO (calc)                         [H]  25                          6-22    SODIUM                    140 mmol/L                  135-146    POTASSIUM                 4.3 mmol/L                  3.5-5.3    CHLORIDE                  102 mmol/L                  98-110    CARBON DIOXIDE            26 mmol/L                   21-33    CALCIUM                   10.2 mg/dL                  9.1-47.8    PROTEIN, TOTAL            7.7 g/dL                    2.9-5.6    ALBUMIN                   4.7 g/dL  3.6-5.1    GLOBULIN (calc)           3.0 g/dL                    5.6-4.3   ALBUMIN/GLOBULIN RATIO (calc)                              1.6                         1.0-2.1    BILIRUBIN, TOTAL          0.4 mg/dL                   3.2-9.5    ALKALINE PHOSPHATASE      65 U/L                      33-115    AST                       34 U/L                      10-35    ALT                  [H]  45 U/L                      6-40    Note: An exclamation mark (!) indicates a result that was not dispersed into   the flowsheet.  Document Creation Date: 01/31/2006 12:14 AM  _______________________________________________________________________    (1) Order result status: Final  Collection or observation date-time: 01/30/2006 10:28  Requested date-time:   Receipt date-time: 01/30/2006  14:14  Reported date-time: 01/30/2006 23:00  Referring Physician:    Ordering Physician: Jule Schlabach (HOVERMAK)  Specimen Source: S  Source: [name] Order Number: [id number]  Lab site: [facility] [place]      [address]      [place]  [place]  [address]  Signed by Irena Cords MD on 03/26/2006 at 12:21:38            Follow-up for Test Results:   Comments: also recheck glucose, hga1c  Follow-up by: Irena Cords MD,  March 26, 2006 12:20 PM

## 2006-01-30 NOTE — Unmapped (Signed)
Signed by Kaylyn Layer MD on 02/01/2006 at 13:47:53  Patient: Wanda Rowe  Note: All result statuses are Final unless otherwise noted.    Tests: (1) LIPID PANEL (QDL-7600)    TRIGLYCERIDES        [H]  285 mg/dL                   <956    CHOLESTEROL, TOTAL   [H]  213 mg/dL                   387-564    HDL CHOLESTEROL           47 mg/dL                    > OR = 40   LDL-CHOLESTEROL (calc)                              109 mg/dL                   <332             DESIRABLE RANGE <100 MG/DL FOR PATIENTS WITH CHD OR      DIABETES AND <70 MG/DL FOR DIABETIC PATIENTS WITH      KNOWN HEART DISEASE.          CHOL/HDLC RATIO (calc)                              4.5                         < OR = 5.0    Note: An exclamation mark (!) indicates a result that was not dispersed into   the flowsheet.  Document Creation Date: 01/31/2006 12:14 AM  _______________________________________________________________________    (1) Order result status: Final  Collection or observation date-time: 01/30/2006 10:28  Requested date-time:   Receipt date-time: 01/30/2006 14:14  Reported date-time: 01/30/2006 23:00  Referring Physician:    Ordering Physician: Raedyn Klinck (HOVERMAK)  Specimen Source: S  Source: [name] Order Number: [id number] D-[id number]  Lab site: [facility] [place]      [address]      [place]  [place]  [address]

## 2006-01-30 NOTE — Unmapped (Signed)
Signed by Kaylyn Layer MD on 02/01/2006 at 13:47:23  Patient: Wanda Rowe  Note: All result statuses are Final unless otherwise noted.    Tests: (1) TSH, 3RD GENERATION (QDL-899)    TSH, 3RD GENERATION       2.02 mIU/L      REFERENCE RANGE:              > OR = 20 YEARS: 0.40-4.50                   PREGNANCY RANGES       FIRST TRIMESTER    0.20-4.70       SECOND TRIMESTER   0.30-4.10       THIRD TRIMESTER    0.40-2.70           Note: An exclamation mark (!) indicates a result that was not dispersed into   the flowsheet.  Document Creation Date: 01/31/2006 3:26 AM  _______________________________________________________________________    (1) Order result status: Final  Collection or observation date-time: 01/30/2006 10:28  Requested date-time:   Receipt date-time: 01/30/2006 14:14  Reported date-time: 01/31/2006 03:00  Referring Physician:    Ordering Physician: Lovis More (HOVERMAK)  Specimen Source: S  Source: [name] Order Number: [id number]  Lab site: [facility] [place]      [address]      [place]  [place]  [address]

## 2006-01-30 NOTE — Unmapped (Signed)
Signed by Vira Browns MD on 02/03/2006 at 10:34:27  Patient: Wanda Rowe  Note: All result statuses are Final unless otherwise noted.    Tests: (1) HOMOCYSTEINE, CARDIOVASCULAR (QDL-31789)   HOMOCYSTEINE, CARDIOVASCULAR                              6.6 umol/L                  <10.4    Note: An exclamation mark (!) indicates a result that was not dispersed into   the flowsheet.  Document Creation Date: 02/02/2006 3:55 PM  _______________________________________________________________________    (1) Order result status: Final  Collection or observation date-time: 01/30/2006 10:28  Requested date-time:   Receipt date-time: 01/30/2006 14:14  Reported date-time: 02/02/2006 15:00  Referring Physician:    Ordering Physician: Rossi Burdo (HOVERMAK)  Specimen Source: S  Source: [name] Order Number: [id number]  Lab site: [facility] [place]      [address]      [place]  [place]  [address]

## 2006-02-17 NOTE — Unmapped (Signed)
Signed by Irena Cords MD on 02/17/2006 at 14:29:01      Reason for Visit   Chief Complaint: follow up bp    History from: patient    Allergies  No Known Allergies    Medications   LISINOPRIL-HYDROCHLOROTHIAZIDE 20-12.5 MG TABS (LISINOPRIL-HYDROCHLOROTHIAZIDE) 1 qd  LOVASTATIN 20 MG TABS (LOVASTATIN) one by mouth daily  SINGULAIR 10 MG TAB (MONTELUKAST SODIUM) one by mouth at bedtime  NEURONTIN 300 MG CAPS (GABAPENTIN) one by mouth twice a day  FLONASE 50 MCG/ACT SUSPN (FLUTICASONE PROPIONATE (NASAL)) two sprays each nostril at bedtime  ATROVENT 0.03 % SOLN (IPRATROPIUM BROMIDE)   BUSPAR 30 MG TABS (BUSPIRONE HCL) 1 by mouth every am  CO Q10 100 MG TABS (COENZYME Q10) 1 qd        Vital Signs:   Wt: 293 lbs.      Wt chg (lbs): 0  LMP: 02/03/2006  BP: 130/88    Intake recorded by: Carlene Coria CMA on February 17, 2006 7:50 AM        HPI Multiple Chronic   Additional Dx: here for f/u on labs and bp. does not exercise. single parent works full time. 30 year old daughter. bought a treadmill after 3 days of doing treadmill 3 days was so tired after doing this that she fell asleep. knees and hips hurt. eats becayuse of stress doesnt stop when needs to stop.     Current Status:   Systolic Range: 140-159  Diastolic Range: 81-89    ROS/Symptoms:   Patient denies any cardiovascular symptoms.  Patient denies any respiratory symptoms.  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 pt is assymptomatic  Complains of other symptoms of: c/o 1 week of B eye irritation and erythema          Physical Examination:   BP: 130/  88    Physical Exam- Detail:   General Appearance: well-developed, well-nourished and in no acute distress.  Respiratory: Respiration un-labored.  Lung fields clear to auscultation.  No wheezing, rales, rhonchi or pleural rub.  Cardiac: S1 and S2 normal.  RRR without murmurs, rubs, gallops.  No JVD.  Musculoskeletal: Gait coordinated and smooth.  Digits are  without clubbing or cyanosis.        New Problems:  CONJUNCTIVITIS (ICD-372.30)  B12 DEFICIENCY (ICD-266.2)  New Medications:  BENICAR 20 MG TABS (OLMESARTAN MEDOXOMIL) 1 qd  ATROVENT 0.03 % SOLN (IPRATROPIUM BROMIDE) 2 sprays bid  CO Q 10 60 MG CAPS (COENZYME Q10)   LEXAPRO 10 MG TABS (ESCITALOPRAM OXALATE) one by mouth daily      Preventive Maintenance        Assessment and Plan  Status of Existing Problems:  Assessed HYPERGLYCEMIA as deteriorated - meridia 15 mg, dietician, appt in 1 mo - Taydon Nasworthy K Rolande Moe MD - Signed  Assessed HYPERTRIGLYCERIDEMIA as deteriorated - omacor 2 grams qd - Naava Janeway K Roselle Norton MD - Signed  Assessed B12 DEFICIENCY as new - b12 500 mcg qd - Myrikal Messmer K Morrie Daywalt MD - Signed  Assessed HYPERCHOLESTEROLEMIA as unchanged - continue current medications  , cont coq10  - Laraina Sulton K Cashe Gatt MD - Signed  Assessed HYPERTENSION as unchanged - benicar 20 mg daily, 1/2 40 mg - Joscelynn Brutus K Doniesha Landau MD - Signed  Assessed ASTHMA as unchanged - continue current medications   - Sarely Stracener K Laketra Bowdish MD - Signed  Assessed ANXIETY as improved - waen buspar followed by lexapro - Tresea Heine K Brealynn Contino  MD - Signed  Assessed CONJUNCTIVITIS as new - change bp meds, if no better then genoptic - Edris Friedt Johnn Hai MD - Signed  New Problems:  Dx of CONJUNCTIVITIS (ICD-372.30)  Onset: 02/17/2006  Dx of B12 DEFICIENCY (ICD-266.2)  Onset: 02/17/2006    Medications   New Prescriptions/Refills:  ATROVENT 0.03 % SOLN (IPRATROPIUM BROMIDE) 2 sprays bid  #1 x 6 : Tawni Melkonian K Stacye Noori MD (02/17/2006)  BUSPAR 30 MG TABS (BUSPIRONE HCL) 1 by mouth every am  #30 x 1 : Daila Elbert K Maree Ainley MD (02/17/2006)  FLONASE 50 MCG/ACT SUSPN (FLUTICASONE PROPIONATE (NASAL)) two sprays each nostril at bedtime  #1 x 6 : Riyansh Gerstner K Jevon Shells MD (02/17/2006)  NEURONTIN 300 MG CAPS (GABAPENTIN) one by mouth twice a day  #60 x 6 : Teryl Mcconaghy K Little Winton MD (02/17/2006)  SINGULAIR 10 MG TAB (MONTELUKAST SODIUM) one by mouth at bedtime  #30 x 6 : Furkan Keenum K Schneur Crowson MD (02/17/2006)  LOVASTATIN 20 MG TABS  (LOVASTATIN) one by mouth daily  #30 x 6 : Joetta Delprado K Nichalos Brenton MD (02/17/2006)  LEXAPRO 10 MG TABS (ESCITALOPRAM OXALATE) one by mouth daily  #30 x 6 : Harim Bi K Oluwasemilore Pascuzzi MD (02/17/2006)    New medications:  BENICAR 20 MG TABS -- 1 qd  Start date: 02/17/2006  ATROVENT 0.03 % SOLN -- 2 sprays bid  Start date: 02/17/2006  CO Q 10 60 MG CAPS  Start date: 02/17/2006  LEXAPRO 10 MG TABS -- one by mouth daily  Start date: 02/17/2006      Today's Orders   99214 - Ofc Vst, Est Level IV [CPT-99214]    Disposition:   in 1 month(s)             Prescriptions:  ATROVENT 0.03 % SOLN (IPRATROPIUM BROMIDE) 2 sprays bid  #1 x 6   Entered and Authorized by: Jaryiah Mehlman Johnn Hai MD   Signed by: Irena Cords MD on 02/17/2006   Method used: Print then Give to Patient  BUSPAR 30 MG TABS (BUSPIRONE HCL) 1 by mouth every am  #30 x 1   Entered and Authorized by: Milton Sagona Johnn Hai MD   Signed by: Irena Cords MD on 02/17/2006   Method used: Print then Give to Patient  FLONASE 50 MCG/ACT SUSPN (FLUTICASONE PROPIONATE (NASAL)) two sprays each nostril at bedtime  #1 x 6   Entered and Authorized by: Kaeley Vinje Johnn Hai MD   Signed by: Irena Cords MD on 02/17/2006   Method used: Print then Give to Patient  NEURONTIN 300 MG CAPS (GABAPENTIN) one by mouth twice a day  #60 x 6   Entered and Authorized by: Sky Borboa Johnn Hai MD   Signed by: Irena Cords MD on 02/17/2006   Method used: Print then Give to Patient  SINGULAIR 10 MG TAB (MONTELUKAST SODIUM) one by mouth at bedtime  #30 x 6   Entered and Authorized by: Maylea Soria Johnn Hai MD   Signed by: Irena Cords MD on 02/17/2006   Method used: Print then Give to Patient  LOVASTATIN 20 MG TABS (LOVASTATIN) one by mouth daily  #30 x 6   Entered and Authorized by: Deborah Lazcano Johnn Hai MD   Signed by: Irena Cords MD on 02/17/2006   Method used: Print then Give to Patient  LEXAPRO 10 MG TABS (ESCITALOPRAM OXALATE) one by mouth daily  #30 x 6   Entered and Authorized by: Makai Dumond Johnn Hai MD   Signed by: Mishell Donalson Johnn Hai MD  on 02/17/2006   Method used: Print then Give to Patient                ]

## 2006-03-26 NOTE — Unmapped (Signed)
Signed by Irena Cords MD on 03/26/2006 at 12:19:08      Reason for Visit   Chief Complaint: FOLLOW UP TEST AND BP    History from: patient    Allergies  No Known Allergies    Medications   BENICAR 20 MG TABS (OLMESARTAN MEDOXOMIL) 1 qd  LOVASTATIN 20 MG TABS (LOVASTATIN) one by mouth daily  SINGULAIR 10 MG TAB (MONTELUKAST SODIUM) one by mouth at bedtime  NEURONTIN 300 MG CAPS (GABAPENTIN) one by mouth twice a day  FLONASE 50 MCG/ACT SUSPN (FLUTICASONE PROPIONATE (NASAL)) two sprays each nostril at bedtime  ATROVENT 0.03 % SOLN (IPRATROPIUM BROMIDE) 2 sprays bid  BUSPAR 30 MG TABS (BUSPIRONE HCL) 1 by mouth every am  CO Q 10 60 MG CAPS (COENZYME Q10)   LEXAPRO 10 MG TABS (ESCITALOPRAM OXALATE) one by mouth daily    BENICAR 40 MG TABS (OLMESARTAN MEDOXOMIL) 1 by mouth qd  LOVASTATIN 20 MG TABS (LOVASTATIN) one by mouth daily  SINGULAIR 10 MG TAB (MONTELUKAST SODIUM) one by mouth at bedtime  NEURONTIN 300 MG CAPS (GABAPENTIN) one by mouth twice a day  FLONASE 50 MCG/ACT SUSPN (FLUTICASONE PROPIONATE (NASAL)) two sprays each nostril at bedtime  ATROVENT 0.03 % SOLN (IPRATROPIUM BROMIDE) 2 sprays bid  CO Q 10 60 MG CAPS (COENZYME Q10)   LEXAPRO 10 MG TABS (ESCITALOPRAM OXALATE) one by mouth daily        Vital Signs:   Wt: 289 lbs.      Wt chg (lbs): -4  BP: 130/80    Intake recorded by: Carlene Coria CMA on March 26, 2006 11:26 AM        HPI Multiple Chronic   Additional Dx: here for f/u increased benicar to 40 mg on 2/7. 1 week of slightly dizzy feeling. sulfa allergy.            Physical Examination:   BP: 130/  80    Physical Exam- Detail:   General Appearance: well-developed, well-nourished and in no acute distress.  Respiratory: Respiration un-labored.  Lung fields clear to auscultation.  No wheezing, rales, rhonchi or pleural rub.  Cardiac: S1 and S2 normal.  RRR without murmurs, rubs, gallops.  No JVD.        New Medications:  BENICAR 40 MG TABS (OLMESARTAN MEDOXOMIL) 1 by mouth qd      Preventive  Maintenance        Assessment and Plan  Status of Existing Problems:  Assessed HYPERTENSION as unchanged - trial benicar 20 mg and hctx 12.5 1-2 qd - Carrington Mullenax K Monika Chestang MD    Medications   New medications:  BENICAR 40 MG TABS -- 1 by mouth qd      Today's Orders   99213 - Ofc Vst, Est Level III [[id number]]

## 2006-03-29 ENCOUNTER — Inpatient Hospital Stay

## 2006-03-29 NOTE — Unmapped (Signed)
Signed by Carlene Coria CMA on 03/29/2006 at 13:42:42    Phone Note   Patient Call  [facility] Cell Phone #: [phone]  Caller: patient    Summary of Call: Patient just seen you last week.  Patient fell on ice and injuried tail bone.  Patient is in serious pain.  Patient has been taking Vicodin all weekend and can not sit.  Please call patient back and advise     Initial call taken by: Catalina Pizza CMA,  March 29, 2006 9:43 AM      Follow-up for Phone Call   appt am or with chris  Follow-up by: Irena Cords MD,  March 29, 2006 12:52 PM    Additional Follow-up for Phone Call   phone call completed, appointment scheduled today  Additional Follow-up by: Carlene Coria CMA,  March 29, 2006 1:42 PM

## 2006-03-29 NOTE — Unmapped (Signed)
Signed by   LinkLogic on 03/30/2006 at 08:32:44  Patient: Wanda Rowe  Note: All result statuses are Final unless otherwise noted.    Tests: (1) DIAG-L-SPINE MIN 4-VIEWS (161096)    Order NotePricilla Handler Order Number: [id number]    Order Note:     *** VERIFIED ***  [facility]  Reason:  BACK PAIN  Dict.Staff: Loni Dolly [id number]    Verified By: Loni Dolly       Ver: 03/30/06   8:32 am  Exams:  DIAG-L-SPINE MIN 4-VIEWS  DIAG-PELVIS 1 OR 2-VIEWS    Pelvis    Clinical History: Pain    03/29/2006    Two views    AP and AP upright views    Normal.      Lumbar spine    AP, oblique, lateral, and coned-down views. Lateral  flexion-extension views were done as well is neutral    There is a small amount of air within the disc at L5-S1 possibly  indicating some degenerative change. There is sclerosis of the  facets at L4-L5  with hypertrophy at this level. No pars defect,  slippage, or other abnormality is seen.    Impression: Vacuum phenomenon at L5-S1. Facet hypertrophy at  L4-L5.  **** end of result ****    Note: An exclamation mark (!) indicates a result that was not dispersed into   the flowsheet.  Document Creation Date: 03/30/2006 8:32 AM  _______________________________________________________________________    (1) Order result status: Final  Collection or observation date-time: 03/29/2006 15:31:00  Requested date-time: 03/29/2006 15:31:00  Receipt date-time:   Reported date-time: 03/30/2006 08:32:38  Referring Physician: Cindi Carbon NON-STAFF  Ordering Physician: Otho Ket (MCKIERCJ)  Specimen Source:   Source: QRS  Filler Order Number: [id number]  Lab site: [facility]

## 2006-03-29 NOTE — Unmapped (Signed)
Signed by Neil Crouch MD on 03/29/2006 at 14:56:16      Reason for Visit   Chief Complaint: pt stated she fell on the ice sat morning and thinks she might have injured her tail bone    History from: patient    Allergies  ! SULFA    Medications   Current Meds:   BENICAR 40 MG TABS (OLMESARTAN MEDOXOMIL) 1 by mouth qd  LOVASTATIN 20 MG TABS (LOVASTATIN) one by mouth daily  SINGULAIR 10 MG TAB (MONTELUKAST SODIUM) one by mouth at bedtime  NEURONTIN 300 MG CAPS (GABAPENTIN) one by mouth twice a day  FLONASE 50 MCG/ACT SUSPN (FLUTICASONE PROPIONATE (NASAL)) two sprays each nostril at bedtime  ATROVENT 0.03 % SOLN (IPRATROPIUM BROMIDE) 2 sprays bid  CO Q 10 60 MG CAPS (COENZYME Q10)   LEXAPRO 10 MG TABS (ESCITALOPRAM OXALATE) one by mouth daily      Vital Signs:       Temperature: 98.5  degrees F  oral  BP: 120/86    Intake recorded by: Chesley Mires CMA on March 29, 2006 2:28 PM          Pain Symptoms- Back & Spine #1   Chief Complaint: fall  History from: patient  Duration: 2 day(s)  Context-Other: Fall   Date of Injury: 03/27/2006  Other location: tail bone  fell directly on ass    Severity:   Comments: 46 yo female s/p Fall on 2.23 Sat. Am.  pt was out in street with dog, slipped on ice and fall on [place].  pt had Rx vicodin left over and was filled Sunday.   states not really helping with pain.  not taking other OTC  NSAID.  really hard to stand up.    denies radiation down leg.      Modifying Factors:     Aggravating Factors:   Aggravates: standing    Alleviating Factors:   Alleviates: sitting  Other: sitting on toilet, or donut    Associated Symptoms:   numbness/loss of sensation- Denies  difficulty falling asleep due to pain- Denies    Previous Treatments   Narcotics: temporarily relieves/relieved  NSAIDs: not taken  OTC: not taken    Past History  Surgical History (reviewed - no changes required):  ACL Repair: x3 left knee, Meniscal Repair: x3 left knee, Peridontal surgery      Review of  Systems   Musculoskeletal: Complains of see HPI, back pain. Denies muscle cramps.   Neurologic: Denies paresthesias.   Heme/Lymphatic: Denies abnormal bruising.       Physical Examination:   BP: 120/  86    Physical Exam- Detail:   General Appearance: well-developed, well-nourished and in no acute distress.  Musculoskeletal: + tender around sacrum / coccyx  no skin discoloration / bruising  no paresthesia  Cervical Spine no point tenderness    Thoracic Spine: no point tenderness    Lumbar Spine: no point tenderness          New Problems:  ACCIDENTAL FALL NOS (ICD-E888.9)  BACK PAIN (ICD-724.5)  New Medications:  IBUPROFEN 800 MG TABS (IBUPROFEN) one by mouth every 6-8 hours ** take w/ food **  VICODIN ES 7.5-750 MG TABS (HYDROCODONE-ACETAMINOPHEN) one by mouth every six hours as needed for severe pain  New Allergies:  ! SULFA    Preventive Maintenance          Coordinating Care Providers   PCP Name: Dr. Gari Crown     Assessment and  Plan  Status of Existing Problems:  Assessed ACCIDENTAL FALL NOS as new - #1  s/p FALL -  injury on 2.23 fell on ice ... r/o tail bone fracture will send for X ray sacrum / coccyx, pelvis.  will call w/ results. Neil Crouch MD  Assessed BACK PAIN as new - #2  BACK PAIN -  secondary to above ... vicodin not helping....will add ibuprofen 800mg  three times a day as anti inflammatory ... cont Vicodin as needed at bedtime ... refill #30 given. Neil Crouch MD  New Problems:  Dx of ACCIDENTAL FALL NOS (ICD-E888.9)  Onset: 03/29/2006  Dx of BACK PAIN (ICD-724.5)  Onset: 03/29/2006    Medications   New Prescriptions/Refills:  VICODIN ES 7.5-750 MG TABS (HYDROCODONE-ACETAMINOPHEN) one by mouth every six hours as needed for severe pain  #30 x 0 : Neil Crouch MD (03/29/2006)  IBUPROFEN 800 MG TABS (IBUPROFEN) one by mouth every 6-8 hours ** take w/ food **  #60 x 0 : Neil Crouch MD (03/29/2006)    New medications:  IBUPROFEN 800 MG TABS -- one  by mouth every 6-8 hours ** take w/ food **  Start date: 03/29/2006  VICODIN ES 7.5-750 MG TABS -- one by mouth every six hours as needed for severe pain  Start date: 03/29/2006    Patient Instructions   pt to get spine X ray today at [facility]  will call w/ result    Today's Orders   99213 - Ofc Vst, Est Level III [CPT-99213]  X-ray, Pelvis, 2 views [CPT-72170]  X-ray, Spine, Lumbosacral complete, w/ bending views [CPT-72114]    Disposition:   as needed       Patient Education   Education was provided to: patient  Patient Response: Expressed understanding    Topics Discussed:   Medical Condition, Workup Options, Treatment Options, Medication Use.  Informed How: Verbally            Prescriptions:  VICODIN ES 7.5-750 MG TABS (HYDROCODONE-ACETAMINOPHEN) one by mouth every six hours as needed for severe pain  #30 x 0   Entered and Authorized by: Neil Crouch MD   Signed by: Neil Crouch MD on 03/29/2006   Method used: Print then Give to Patient  IBUPROFEN 800 MG TABS (IBUPROFEN) one by mouth every 6-8 hours ** take w/ food **  #60 x 0   Entered and Authorized by: Neil Crouch MD   Signed by: Neil Crouch MD on 03/29/2006   Method used: Print then Give to Patient

## 2006-03-29 NOTE — Unmapped (Signed)
Signed by   LinkLogic on 03/30/2006 at 08:32:45  Patient: Wanda Rowe  Note: All result statuses are Final unless otherwise noted.    Tests: (1) DIAG-PELVIS 1 OR 2-VIEWS ([id number])    Order NotePricilla Handler Order Number: [id number]    Order Note:     *** VERIFIED ***  [facility]  Reason:  BACK PAIN  Dict.Staff: Loni Dolly [id number]    Verified By: Loni Dolly       Ver: 03/30/06   8:32 am  Exams:  DIAG-L-SPINE MIN 4-VIEWS  DIAG-PELVIS 1 OR 2-VIEWS    Pelvis    Clinical History: Pain    03/29/2006    Two views    AP and AP upright views    Normal.      Lumbar spine    AP, oblique, lateral, and coned-down views. Lateral  flexion-extension views were done as well is neutral    There is a small amount of air within the disc at L5-S1 possibly  indicating some degenerative change. There is sclerosis of the  facets at L4-L5  with hypertrophy at this level. No pars defect,  slippage, or other abnormality is seen.    Impression: Vacuum phenomenon at L5-S1. Facet hypertrophy at  L4-L5.  **** end of result ****    Note: An exclamation mark (!) indicates a result that was not dispersed into   the flowsheet.  Document Creation Date: 03/30/2006 8:32 AM  _______________________________________________________________________    (1) Order result status: Final  Collection or observation date-time: 03/29/2006 15:31:00  Requested date-time: 03/29/2006 15:31:00  Receipt date-time:   Reported date-time: 03/30/2006 08:32:38  Referring Physician: Cindi Carbon NON-STAFF  Ordering Physician: Otho Ket (MCKIERCJ)  Specimen Source:   Source: QRS  Filler Order Number: [id number]  Lab site: [facility]

## 2006-03-30 NOTE — Unmapped (Signed)
Signed by Nolon Stalls on 03/31/2006 at 08:55:33    ---- Converted from flag ----  ---- 03/30/2006 9:14 AM, Neil Crouch MD wrote:  can you call pt:  let her know the spine and pelvis x - ray was negative for fracture     she should cont. the ibuprofen three times a day for 1 week ... and use the vicodin as needed.  continue to rest the area by sitting on the donut when possible.  she can also try ice to the area to help with the pain &  inflammation.    call us after 1 week if it is not getting any better.    Jarvis Sawa  ------------------------------    Phone Note   Outgoing Call    Call placed by: Chesley Mires CMA,  March 30, 2006 9:35 AM  Call placed to: Patient  Summary of call: Left message to call back to let pt know the above information on her back xray.        Follow-up for Phone Call   Gave patient the information - from dr Madison Hickman...on x-ray and suggested treatment  Follow-up by: Nolon Stalls,  March 31, 2006 8:54 AM

## 2006-05-11 LAB — COMPREHENSIVE METABOLIC PANEL
A/G Ratio: 1.6 (ref 1.0–2.1)
ALT: 38 units/L (ref 6–40)
AST: 31 units/L (ref 10–35)
Albumin: 4.5 g/dL (ref 3.6–5.1)
Alkaline Phosphatase: 67 units/L (ref 33–115)
BUN/Creatinine Ratio: 20 (ref 6–22)
BUN: 14 mg/dL (ref 7–25)
CO2: 27 mmol/L (ref 21–33)
Calcium: 10.2 mg/dL (ref 8.6–10.2)
Chloride: 101 mmol/L (ref 98–110)
Creatinine: 0.7 mg/dL (ref 0.50–1.20)
GFR MDRD Non Af Amer: >60 mL/min (ref >=60)
Globulin, Total: 2.9 g/dL (ref 2.2–3.9)
Glucose: 137 mg/dL (ref 65–99)
Potassium: 4.1 mmol/L (ref 3.5–5.3)
Sodium: 139 mmol/L (ref 135–146)
Total Bilirubin: 0.6 mg/dL (ref 0.2–1.2)
Total Protein: 7.4 g/dL (ref 6.2–8.3)

## 2006-05-11 LAB — HEMOGLOBIN A1C: Hemoglobin A1C: 6.7 % — ABNORMAL HIGH

## 2006-05-11 NOTE — Unmapped (Signed)
Signed by Irena Cords MD on 05/12/2006 at 08:47:45  Patient: Wanda Rowe  Note: All result statuses are Final unless otherwise noted.    Tests: (1) HEMOGLOBIN A1c (QDL-496)   HEMOGLOBIN A1c (% of total Hgb)                         [H]  6.7 %      NON-DIABETIC: <6.0%           Note: An exclamation mark (!) indicates a result that was not dispersed into   the flowsheet.  Document Creation Date: 05/12/2006 5:33 AM  _______________________________________________________________________    (1) Order result status: Final  Collection or observation date-time: 05/11/2006 09:30  Requested date-time:   Receipt date-time: 05/11/2006 14:43  Reported date-time: 05/12/2006 04:00  Referring Physician:    Ordering Physician: Markell Sciascia (HOVERMAK)  Specimen Source: B  Source: [name] Order Number: [id number]  Lab site: [facility] [place]      [address]      [place]  [place]  [address]

## 2006-05-11 NOTE — Unmapped (Signed)
Signed by Irena Cords MD on 05/12/2006 at 08:47:45  Patient: Stephie Xu Golliday  Note: All result statuses are Final unless otherwise noted.    Tests: (1) COMPREHENSIVE METABOLIC PANEL W/EGFR (QDL-10231)    GLUCOSE              [H]  137 mg/dL                   54-09                  FASTING REFERENCE INTERVAL    UREA NITROGEN (BUN)       14 mg/dL                    8-11    CREATININE                0.7 mg/dL                   0.50-1.20   EGFR NON-AFR. AMERICAN                              >60 mL/min/1.3m2           > OR = 60  ! EGFR AFRICAN AMERICAN                              >60 mL/min/1.58m2           > OR = 60   BUN/CREATININE RATIO (calc)                              20                          6-22    SODIUM                    139 mmol/L                  135-146    POTASSIUM                 4.1 mmol/L                  3.5-5.3    CHLORIDE                  101 mmol/L                  98-110    CARBON DIOXIDE            27 mmol/L                   21-33    CALCIUM                   10.2 mg/dL                  9.1-47.8    PROTEIN, TOTAL            7.4 g/dL                    2.9-5.6    ALBUMIN                   4.5 g/dL  3.6-5.1    GLOBULIN (calc)           2.9 g/dL                    6.4-4.0   ALBUMIN/GLOBULIN RATIO (calc)                              1.6                         1.0-2.1    BILIRUBIN, TOTAL          0.6 mg/dL                   3.4-7.4    ALKALINE PHOSPHATASE      67 U/L                      33-115    AST                       31 U/L                      10-35    ALT                       38 U/L                      6-40    Note: An exclamation mark (!) indicates a result that was not dispersed into   the flowsheet.  Document Creation Date: 05/11/2006 8:19 PM  _______________________________________________________________________    (1) Order result status: Final  Collection or observation date-time: 05/11/2006 09:30  Requested date-time:   Receipt date-time: 05/11/2006  14:43  Reported date-time: 05/11/2006 19:00  Referring Physician:    Ordering Physician: Jawara Latorre (HOVERMAK)  Specimen Source: S  Source: [name] Order Number: [id number]  Lab site: [facility] [place]      [address]      [place]  [place]  [address]

## 2006-05-14 NOTE — Unmapped (Signed)
Signed by Irena Cords MD on 05/14/2006 at 10:38:23      Reason for Visit   Chief Complaint: follow up bp    History from: patient    Allergies  ! SULFA    Medications   BENICAR 40 MG TABS (OLMESARTAN MEDOXOMIL) 1 by mouth qd  LOVASTATIN 20 MG TABS (LOVASTATIN) one by mouth daily  SINGULAIR 10 MG TAB (MONTELUKAST SODIUM) one by mouth at bedtime  NEURONTIN 300 MG CAPS (GABAPENTIN) one by mouth twice a day  FLONASE 50 MCG/ACT SUSPN (FLUTICASONE PROPIONATE (NASAL)) two sprays each nostril at bedtime  ATROVENT 0.03 % SOLN (IPRATROPIUM BROMIDE) 2 sprays bid  CO Q 10 60 MG CAPS (COENZYME Q10)   LEXAPRO 10 MG TABS (ESCITALOPRAM OXALATE) one by mouth daily    BENICAR 20 MG TABS (OLMESARTAN MEDOXOMIL) 1 qd  LOVASTATIN 20 MG TABS (LOVASTATIN) one by mouth daily  SINGULAIR 10 MG TAB (MONTELUKAST SODIUM) one by mouth at bedtime  NEURONTIN 300 MG CAPS (GABAPENTIN) one by mouth twice a day  FLONASE 50 MCG/ACT SUSPN (FLUTICASONE PROPIONATE (NASAL)) two sprays each nostril at bedtime  ATROVENT 0.03 % SOLN (IPRATROPIUM BROMIDE) 2 sprays bid  CO Q 10 60 MG CAPS (COENZYME Q10)   LEXAPRO 10 MG TABS (ESCITALOPRAM OXALATE) one by mouth daily  HYDROCHLOROTHIAZIDE 12.5 MG CAPS (HYDROCHLOROTHIAZIDE) one by mouth daily  VALTREX 500 MG TABS (VALACYCLOVIR HCL) one by mouth dailey  * B12 500 MCG 1 qd  * CALCIUM CITRATE 1 qd  * FLAX SEED OIL 1000 1 qd  * VIT C 1000 qd        Vital Signs:   Ht: 69 in.  Wt: 280 lbs.      BMI: 41.50  BSA: 2.39  Wt chg (lbs): -9  LMP: 02/10/2006    Blood Pressure #1: 110/60  Blood Pressure #2: 110/80    Intake recorded by: Carlene Coria CMA on May 14, 2006 9:49 AM        HPI Multiple Chronic   Additional Dx: working on lifestyle and diet. 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 no sxs for years until this week. started to get palps again          Physical Examination:   BP: 110/  60    Physical Exam- Detail:   General Appearance: well-developed, well-nourished and in no acute distress.  Respiratory: Respiration  un-labored.  Lung fields clear to auscultation.  No wheezing, rales, rhonchi or pleural rub.  Cardiac: S1 and S2 normal.  RRR without murmurs, rubs, gallops.  No JVD.        New Problems:  PALPITATIONS (ICD-785.1)  New Medications:  BENICAR 20 MG TABS (OLMESARTAN MEDOXOMIL) 1 qd  HYDROCHLOROTHIAZIDE 12.5 MG CAPS (HYDROCHLOROTHIAZIDE) one by mouth daily  VALTREX 500 MG TABS (VALACYCLOVIR HCL) one by mouth dailey  * B12 500 MCG 1 qd  * CALCIUM CITRATE 1 qd  * FLAX SEED OIL 1000 1 qd  * FLAX SEED OIL 1000 2 qd  * VIT C 1000 qd  CO Q10 100 MG TABS (COENZYME Q10) 1 daily      Preventive Maintenance          Coordinating Care Providers   PCP Name: Dr. Gari Crown     Assessment and Plan  Status of Existing Problems:  Assessed HYPERTENSION as improved - cont lifestyle changes, benicar 20-12.5 - Devonta Blanford K Parris Cudworth MD - Signed  Assessed HYPERGLYCEMIA as unchanged - cont lifestyle, recheck 2-3 mos - Yolani Vo K  Trampus Mcquerry MD - Signed  Assessed B12 DEFICIENCY as improved - recheck 3 mos - Sabrea Sankey K Delvonte Berenson MD - Signed  Assessed HYPERTRIGLYCERIDEMIA as unchanged - start flax oil, recheck 3 mo - Amali Uhls K Danaria Larsen MD - Signed  Assessed HYPERCHOLESTEROLEMIA as comment only - recheck 3 mo - Tarren Velardi K Arlyn Buerkle MD - Signed  Assessed PALPITATIONS as deteriorated - monitor - Rayli Wiederhold K Oda Lansdowne MD  New Problems:  Dx of PALPITATIONS (ICD-785.1)  Onset: 05/14/2006    Medications   New Prescriptions/Refills:  CO Q10 100 MG TABS (COENZYME Q10) 1 daily  #30 x 0, 05/14/2006, Zayonna Ayuso K Xaden Kaufman MD  FLAX SEED OIL 1000 2 qd  #60 x 0, 05/14/2006, Donielle Radziewicz K Tiny Rietz MD    New medications:  BENICAR 20 MG TABS -- 1 qd  HYDROCHLOROTHIAZIDE 12.5 MG CAPS -- one by mouth daily  VALTREX 500 MG TABS -- one by mouth dailey  B12 500 MCG -- 1 qd  Start date: 05/14/2006  CALCIUM CITRATE -- 1 qd  FLAX SEED OIL 1000 -- 1 qd  FLAX SEED OIL 1000 -- 2 qd  Start date: 05/14/2006  VIT C 1000 -- qd  CO Q10 100 MG TABS -- 1 daily  Start date: 05/14/2006      Today's Orders   99214 - Ofc Vst,  Est Level IV [CPT-99214]  B-12  (B12)  (927) [CPT-82607]  Lipid Profile   (FATS) (7600) [CPT-80061]  CMP   (METAPNL) (10231) [CPT-80053]  Hemoglobin  A1C   (GLYCO) (496) [CPT-83036]            Prescriptions:  CO Q10 100 MG TABS (COENZYME Q10) 1 daily  #30 x 0   Entered and Authorized by: Danford Tat Johnn Hai MD   Signed by: Irena Cords MD on 05/14/2006   Method used: Print then Give to Patient   RxID: [id number]  FLAX SEED OIL 1000 2 qd  #60 x 0   Entered and Authorized by: Caylan Schifano Johnn Hai MD   Signed by: Irena Cords MD on 05/14/2006   Method used: Print then Give to Patient   RxID: [id number]

## 2006-05-25 NOTE — Unmapped (Signed)
Signed by Irena Cords MD on 05/26/2006 at 09:28:20    Phone Note   Patient Call  Call back at Home Phone: [phone]  Caller: patient  Department: IM - General  Call for: DR Elenora Gamma to schedule: patient refused appointments offered  Summary of Call: PT WILL BE SEEING YOU ON WED DID NOT WANT TO SEE ANY OTHER DR TODAY HAS DIZZNESS AND MITRAL [employer]UE [employer] IS SKIPPING A BEAT. OFF AND ON FOR 1 WEEK OR 2 NOT GOING AWAY IF YOU NEED TO CALL HER AT [phone]    Current Allergies:   ! SULFA    Initial call taken by: Avanell Shackleton,  May 25, 2006 2:47 PM

## 2006-05-26 NOTE — Unmapped (Signed)
Signed by Irena Cords MD on 05/26/2006 at 14:49:50      Reason for Visit   Chief Complaint: lightheaded,SOB      Allergies  ! SULFA    Medications   BENICAR 20 MG TABS (OLMESARTAN MEDOXOMIL) 1 qd  LOVASTATIN 20 MG TABS (LOVASTATIN) one by mouth daily  SINGULAIR 10 MG TAB (MONTELUKAST SODIUM) one by mouth at bedtime  NEURONTIN 300 MG CAPS (GABAPENTIN) one by mouth twice a day  FLONASE 50 MCG/ACT SUSPN (FLUTICASONE PROPIONATE (NASAL)) two sprays each nostril at bedtime  ATROVENT 0.03 % SOLN (IPRATROPIUM BROMIDE) 2 sprays bid  CO Q 10 60 MG CAPS (COENZYME Q10)   LEXAPRO 10 MG TABS (ESCITALOPRAM OXALATE) one by mouth daily  HYDROCHLOROTHIAZIDE 12.5 MG CAPS (HYDROCHLOROTHIAZIDE) one by mouth daily  VALTREX 500 MG TABS (VALACYCLOVIR HCL) one by mouth dailey  * B12 500 MCG 1 qd  * CALCIUM CITRATE 1 qd  * FLAX SEED OIL 1000 2 qd  * VIT C 1000 qd  CO Q10 100 MG TABS (COENZYME Q10) 1 daily       Vital Signs:   Wt: 277 lbs.      BMI: 41.05  BSA: 2.37  Wt chg (lbs): -3  Temperature: 98.9  degrees F Pulse: 72 (regular)  BP: 120/78    Intake recorded by: Fanny Dance MA on May 26, 2006 12:22 PM        HPI Multiple Chronic   Additional Dx: dizziness if moves head or walks around. also c/o palps. for a few weeks. 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 lasts for 2 hr. stents. dizziness x 5-6 days. weaned of lexapro. stopped last week. was on 10 mg then 5 mg x 2 weeks then 5 mg every other day. previously on lexapro x 2 years. dizziness related to head movement. not orthostatic. mild nausea. no uri sxs. dizzines the same or worse since 5 days.also c/o back pain in lower back area. also very emotional x recent. irrationally crying. weaning of lexapro began 4-6 weeks ago. has taken zoloft and buspar for anxiety in the past.          Physical Examination:   BP: 120/  78    Physical Exam- Detail:   General Appearance: well-developed, well-nourished and in no acute distress.  Respiratory: Respiration  un-labored.  Lung fields clear to auscultation.  No wheezing, rales, rhonchi or pleural rub.  Cardiac: S1 and S2 normal.  RRR without murmurs, rubs, gallops.  No JVD.        New Medications:  KLONOPIN 0.5 MG TABS (CLONAZEPAM) 1/2-1 by mouth twice a day      Preventive Maintenance          Coordinating Care Providers   PCP Name: Dr. Gari Crown     Assessment and Plan  Status of Existing Problems:  Assessed PALPITATIONS as deteriorated - event monitor - Baron Parmelee K Shayda Kalka MD - Signed  Assessed ANXIETY as deteriorated - klonipin .25 -.5 twice a day, re-eval 2-4 weeks to determine if will need ssri.  - Zyen Triggs K Creedence Heiss MD - Signed  Assessed HYPERTENSION as improved - continue current medications   - Wilder Amodei K Maritza Hosterman MD - Signed    Medications   New Prescriptions/Refills:  KLONOPIN 0.5 MG TABS (CLONAZEPAM) 1/2-1 by mouth twice a day  #60 x 1, 05/26/2006, Journey Ratterman K Hilmer Aliberti MD    New medications:  KLONOPIN 0.5 MG TABS -- 1/2-1 by mouth twice  a day  Start date: 05/26/2006      Today's Orders   99214 - Ofc Vst, Est Level IV [CPT-99214]  30 Day Event Monitor [IMS-11111]    Disposition:   in 2 week(s)       Patient Education   Education was provided to: patient    Topics Discussed:   Medical Condition.  Informed How: Verbally  Minutes spent on education: 30             Prescriptions:  KLONOPIN 0.5 MG TABS (CLONAZEPAM) 1/2-1 by mouth twice a day  #60 x 1   Entered and Authorized by: Kamirah Shugrue Johnn Hai MD   Signed by: Irena Cords MD on 05/26/2006   Method used: Print then Give to Patient   RxID: [id number]

## 2006-05-28 NOTE — Unmapped (Signed)
Signed by Irena Cords MD on 05/28/2006 at 00:00:00  Cardiac Stress      Imported By: Vance Peper 07/14/2006 15:31:37    _____________________________________________________________________    External Attachment:    Please see [employer] EMR for this document.

## 2006-06-21 LAB — VITAMIN B12: Vitamin B-12: 624 pg/mL (ref 200–1100)

## 2006-06-21 LAB — COMPREHENSIVE METABOLIC PANEL
A/G Ratio: 1.6 (ref 1.0–2.1)
ALT: 41 units/L (ref 6–40)
AST: 29 units/L (ref 10–35)
Albumin: 4.5 g/dL (ref 3.6–5.1)
Alkaline Phosphatase: 68 units/L (ref 33–115)
BUN/Creatinine Ratio: 23 (ref 6–22)
BUN: 16 mg/dL (ref 7–25)
CO2: 24 mmol/L (ref 21–33)
Calcium: 10 mg/dL (ref 8.6–10.2)
Chloride: 102 mmol/L (ref 98–110)
Creatinine: 0.7 mg/dL (ref 0.50–1.20)
GFR MDRD Af Amer: >60 mL/min (ref >=60)
GFR MDRD Non Af Amer: >60 mL/min (ref >=60)
Globulin, Total: 2.8 g/dL (ref 2.2–3.9)
Glucose: 125 mg/dL (ref 65–99)
Potassium: 4 mmol/L (ref 3.5–5.3)
Sodium: 137 mmol/L (ref 135–146)
Total Bilirubin: 0.7 mg/dL (ref 0.2–1.2)
Total Protein: 7.3 g/dL (ref 6.2–8.3)

## 2006-06-21 LAB — LIPID PANEL
Chol/HDL Ratio: 4.6 (ref <=5.0)
Cholesterol, Total: 180 mg/dL (ref 125–200)
HDL: 39 mg/dL (ref >=40)
LDL Cholesterol: 97 mg/dL (ref <130)
Triglycerides: 222 mg/dL (ref <150)

## 2006-06-21 LAB — HEMOGLOBIN A1C: Hemoglobin A1C: 6.7 % — ABNORMAL HIGH

## 2006-06-21 NOTE — Unmapped (Signed)
Signed by Irena Cords MD on 06/23/2006 at 09:47:04  Patient: Wanda Rowe  Note: All result statuses are Final unless otherwise noted.    Tests: (1) VITAMIN B12 (QDL-927)    VITAMIN B12               624 pg/mL                   [id number]            REPORT COMMENT:      FASTING    Note: An exclamation mark (!) indicates a result that was not dispersed into   the flowsheet.  Document Creation Date: 06/21/2006 7:39 PM  _______________________________________________________________________    (1) Order result status: Final  Collection or observation date-time: 06/21/2006 10:33  Requested date-time:   Receipt date-time: 06/21/2006 15:24  Reported date-time: 06/21/2006 19:00  Referring Physician:    Ordering Physician: Charo Philipp (HOVERMAK)  Specimen Source: S  Source: [name] Order Number: [id number]  Lab site: [facility] [place]      [address]      [place]  [place]  [phone]

## 2006-06-21 NOTE — Unmapped (Signed)
Signed by Irena Cords MD on 06/23/2006 at 09:47:04  Patient: Wanda Rowe  Note: All result statuses are Final unless otherwise noted.    Tests: (1) COMPREHENSIVE METABOLIC PANEL W/EGFR (QDL-10231)    GLUCOSE              [H]  125 mg/dL                   86-57                  FASTING REFERENCE INTERVAL    UREA NITROGEN (BUN)       16 mg/dL                    8-46    CREATININE                0.7 mg/dL                   0.50-1.20   eGFR NON-AFR. AMERICAN                              >60 mL/min/1.2m2           > OR = 60   eGFR AFRICAN AMERICAN                              >60 mL/min/1.23m2           > OR = 60   BUN/CREATININE RATIO (calc)                         [H]  23                          6-22    SODIUM                    137 mmol/L                  135-146    POTASSIUM                 4.0 mmol/L                  3.5-5.3    CHLORIDE                  102 mmol/L                  98-110    CARBON DIOXIDE            24 mmol/L                   21-33    CALCIUM                   10.0 mg/dL                  9.6-29.5    PROTEIN, TOTAL            7.3 g/dL                    2.8-4.1    ALBUMIN                   4.5 g/dL  3.6-5.1    GLOBULIN (calc)           2.8 g/dL                    6.6-0.6   ALBUMIN/GLOBULIN RATIO (calc)                              1.6                         1.0-2.1    BILIRUBIN, TOTAL          0.7 mg/dL                   3.0-1.6    ALKALINE PHOSPHATASE      68 U/L                      33-115    AST                       29 U/L                      10-35    ALT                  [H]  41 U/L                      6-40            REPORT COMMENT:      FASTING    Note: An exclamation mark (!) indicates a result that was not dispersed into   the flowsheet.  Document Creation Date: 06/21/2006 8:10 PM  _______________________________________________________________________    (1) Order result status: Final  Collection or observation date-time: 06/21/2006 10:33  Requested date-time:      Receipt date-time: 06/21/2006 15:24  Reported date-time: 06/21/2006 19:00  Referring Physician:    Ordering Physician: Ryiah Bellissimo (HOVERMAK)  Specimen Source: S  Source: [name] Order Number: [id number]  Lab site: [facility] [place]      [address]      [place]  [place]  [phone]

## 2006-06-21 NOTE — Unmapped (Signed)
Signed by Irena Cords MD on 06/23/2006 at 09:47:04  Patient: Wanda Rowe  Note: All result statuses are Final unless otherwise noted.    Tests: (1) HEMOGLOBIN A1c (QDL-496)   HEMOGLOBIN A1c (% of total Hgb)                         [H]  6.7 %      NON-DIABETIC: <6.0%                   REPORT COMMENT:      FASTING    Note: An exclamation mark (!) indicates a result that was not dispersed into   the flowsheet.  Document Creation Date: 06/22/2006 12:14 AM  _______________________________________________________________________    (1) Order result status: Final  Collection or observation date-time: 06/21/2006 10:33  Requested date-time:   Receipt date-time: 06/21/2006 15:24  Reported date-time: 06/22/2006 00:00  Referring Physician:    Ordering Physician: Desiderio Dolata (HOVERMAK)  Specimen Source: B  Source: [name] Order Number: [id number]  Lab site: [facility] [place]      [address]      [place]    [phone]

## 2006-06-21 NOTE — Unmapped (Signed)
Signed by Irena Cords MD on 06/23/2006 at 09:47:04  Patient: Wanda Rowe  Note: All result statuses are Final unless otherwise noted.    Tests: (1) LIPID PANEL (QDL-7600)    TRIGLYCERIDES        [H]  222 mg/dL                   <657    CHOLESTEROL, TOTAL        180 mg/dL                   846-962    HDL CHOLESTEROL      [L]  39 mg/dL                    > OR = 40   LDL-CHOLESTEROL (calc)                              97 mg/dL                    <952             DESIRABLE RANGE <100 MG/DL FOR PATIENTS WITH CHD OR      DIABETES AND <70 MG/DL FOR DIABETIC PATIENTS WITH      KNOWN HEART DISEASE.          CHOL/HDLC RATIO (calc)                              4.6                         < OR = 5.0    Note: An exclamation mark (!) indicates a result that was not dispersed into   the flowsheet.  Document Creation Date: 06/21/2006 8:10 PM  _______________________________________________________________________    (1) Order result status: Final  Collection or observation date-time: 06/21/2006 10:33  Requested date-time:   Receipt date-time: 06/21/2006 15:24  Reported date-time: 06/21/2006 19:00  Referring Physician:    Ordering Physician: Tmya Wigington (HOVERMAK)  Specimen Source: S  Source: [name] Order Number: [id number]  Lab site: [facility] [place]      [address]      [place]  [place]  [phone]

## 2006-06-23 NOTE — Unmapped (Signed)
Signed by Irena Cords MD on 06/23/2006 at 14:14:37      Reason for Visit   Chief Complaint: follow up tests    History from: patient    Allergies  ! SULFA    Medications   BENICAR 20 MG TABS (OLMESARTAN MEDOXOMIL) 1 qd  LOVASTATIN 20 MG TABS (LOVASTATIN) one by mouth daily  SINGULAIR 10 MG TAB (MONTELUKAST SODIUM) one by mouth at bedtime  NEURONTIN 300 MG CAPS (GABAPENTIN) one by mouth twice a day  FLONASE 50 MCG/ACT SUSPN (FLUTICASONE PROPIONATE (NASAL)) two sprays each nostril at bedtime  ATROVENT 0.03 % SOLN (IPRATROPIUM BROMIDE) 2 sprays bid  CO Q 10 60 MG CAPS (COENZYME Q10)   LEXAPRO 10 MG TABS (ESCITALOPRAM OXALATE) one by mouth daily  HYDROCHLOROTHIAZIDE 12.5 MG CAPS (HYDROCHLOROTHIAZIDE) one by mouth daily  VALTREX 500 MG TABS (VALACYCLOVIR HCL) one by mouth dailey  * B12 500 MCG 1 qd  * CALCIUM CITRATE 1 qd  * FLAX SEED OIL 1000 2 qd  * VIT C 1000 qd  CO Q10 100 MG TABS (COENZYME Q10) 1 daily  KLONOPIN 0.5 MG TABS (CLONAZEPAM) 1/2-1 by mouth twice a day    LOVASTATIN 20 MG TABS (LOVASTATIN) one by mouth daily  SINGULAIR 10 MG TAB (MONTELUKAST SODIUM) one by mouth at bedtime  NEURONTIN 300 MG CAPS (GABAPENTIN) one by mouth twice a day  FLONASE 50 MCG/ACT SUSPN (FLUTICASONE PROPIONATE (NASAL)) two sprays each nostril at bedtime  ATROVENT 0.03 % SOLN (IPRATROPIUM BROMIDE) 2 sprays bid  CO Q 10 60 MG CAPS (COENZYME Q10)   VALTREX 500 MG TABS (VALACYCLOVIR HCL) one by mouth dailey  * B12 500 MCG 1 qd  * CALCIUM CITRATE 1 qd  * FLAX SEED OIL 1000 2 qd  * VIT C 1000 qd  CO Q10 100 MG TABS (COENZYME Q10) 1 daily  BENICAR HCT 20-12.5 MG TABS (OLMESARTAN MEDOXOMIL-HCTZ) one by mouth daily        Vital Signs:   Wt: 271 lbs.      BMI: 40.16  BSA: 2.35  Wt chg (lbs): -6  BP: 128/88    Intake recorded by: Carlene Coria CMA on Jun 23, 2006 9:15 AM        HPI Multiple Chronic   Additional Dx: no palps since monitor. took klonipin x less than 1 week and now dizzy spells and palps have resolved. off lexapro and feels  ok. started exercising and watching diet a nd loosing wt.     Past History  Past Medical History (reviewed - no changes required):  hbp  asthma and allergies  Surgical History (reviewed - no changes required):  ACL Repair: x3 left knee, Meniscal Repair: x3 left knee, Peridontal surgery    Family History (reviewed - no changes required): Father - diabetes 2  MGM - diabetes 2 bladder ca strokes  MGF - colon ca sung ca  PGM - breast ca  Social History (reviewed - no changes required): Marital Status: single,   Children: 1,   Alcohol Use: occasionally  Tobacco Usage:non-smoker        Physical Examination:   BP: 128/  88    Physical Exam- Detail:   General Appearance: well-developed, well-nourished and in no acute distress.  Respiratory: Respiration un-labored.  Lung fields clear to auscultation.  No wheezing, rales, rhonchi or pleural rub.  Cardiac: S1 and S2 normal.  RRR without murmurs, rubs, gallops.  No JVD.        Follow-up for  Test Results:     New Medications:  LOVASTATIN 10 MG TABS (LOVASTATIN) one by mouth daily  BENICAR HCT 20-12.5 MG TABS (OLMESARTAN MEDOXOMIL-HCTZ) one by mouth daily  GLUCOPHAGE XR 500 MG TB24 (METFORMIN HCL) 1 qd      Preventive Maintenance          Coordinating Care Providers   PCP Name: Dr. Gari Crown     Assessment and Plan  Status of Existing Problems:  Assessed HYPERGLYCEMIA as improved - start glucophage - Keola Heninger K Eyana Stolze MD - Signed  Assessed HYPERTRIGLYCERIDEMIA as improved - cont wt loss - Zedekiah Hinderman K Yousaf Sainato MD - Signed  Assessed HYPERCHOLESTEROLEMIA as improved - decrease lovastatin 10 mg - Zayvian Mcmurtry K Sean Malinowski MD - Signed  Assessed B12 DEFICIENCY as improved - continue current medications   - Abie Killian K Cashawn Yanko MD - Signed    Medications   New Prescriptions/Refills:  BENICAR HCT 20-12.5 MG TABS (OLMESARTAN MEDOXOMIL-HCTZ) one by mouth daily  #30 x 3, 06/23/2006, Vonceil Upshur K Yeva Bissette MD  GLUCOPHAGE XR 500 MG TB24 (METFORMIN HCL) 1 qd  #30 x 3, 06/23/2006, Chas Axel K Andersyn Fragoso MD  LOVASTATIN 10 MG  TABS (LOVASTATIN) one by mouth daily  #30 x 5, 06/23/2006, Shaundrea Carrigg K Rayhaan Huster MD    New medications:  LOVASTATIN 10 MG TABS -- one by mouth daily  Start date: 06/23/2006  BENICAR HCT 20-12.5 MG TABS -- one by mouth daily  GLUCOPHAGE XR 500 MG TB24 -- 1 qd  Start date: 06/23/2006      Today's Orders   99214 - Ofc Vst, Est Level IV [IHK-74259]  Lipid Profile   (FATS) (7600) [CPT-80061]  CMP   (METAPNL) (10231) [CPT-80053]  Hemoglobin  A1C   (GLYCO) (496) [CPT-83036]  Vitamin D, 25-Hydrodroxy  (VITMD25) (680) [CPT-82306]            Prescriptions:  BENICAR HCT 20-12.5 MG TABS (OLMESARTAN MEDOXOMIL-HCTZ) one by mouth daily  #30 x 3   Entered and Authorized by: Lorraina Spring Johnn Hai MD   Signed by: Irena Cords MD on 06/23/2006   Method used: Print then Give to Patient   RxID: [id number]  GLUCOPHAGE XR 500 MG TB24 (METFORMIN HCL) 1 qd  #30 x 3   Entered and Authorized by: Vuk Skillern Johnn Hai MD   Signed by: Irena Cords MD on 06/23/2006   Method used: Print then Give to Patient   RxID: [id number]  LOVASTATIN 10 MG TABS (LOVASTATIN) one by mouth daily  #30 x 5   Entered and Authorized by: Erion Weightman Johnn Hai MD   Signed by: Irena Cords MD on 06/23/2006   Method used: Print then Give to Patient   RxID: [id number]                ]

## 2006-07-16 NOTE — Unmapped (Signed)
Signed by Carlene Coria CMA on 07/16/2006 at 12:52:11    Prescriptions:  FLONASE 50 MCG/ACT SUSPN (FLUTICASONE PROPIONATE (NASAL)) two sprays each nostril at bedtime  #1 x 6   Entered by: Carlene Coria CMA   Authorized by: Chynah Orihuela Johnn Hai MD   Signed by: Carlene Coria CMA on 07/16/2006   Method used: Telephoned to ...     [facility]     [address]     [place], [place]  [address]     Ph: [phone]     Fax: [phone]   RxID: [id number]

## 2006-07-16 NOTE — Unmapped (Signed)
Signed by Carlene Coria CMA on 07/16/2006 at 11:13:41    Phone Note   Patient Call  Caller: patient  Department: IM - General  Call for: Wanda Rowe    Reason for Call: medication issues  Summary of Call: Pt would like Rx  Atrovent Nasal Sray called into a local pharmacy, [facility] [phone].  Her new med Glucophage will that be ok to take with Benicar-Hct?, she read somewhere it may conflict with each other.       Initial call taken by: Rochele Raring,  July 16, 2006 9:53 AM      Follow-up for Phone Call   ok to takes meds together  Follow-up by: Irena Cords MD,  July 16, 2006 9:59 AM    Additional Follow-up for Phone Call   phone call completed, Rx completed  Additional Follow-up by: Carlene Coria CMA,  July 16, 2006 11:13 AM    Prescriptions:  ATROVENT 0.03 % SOLN (IPRATROPIUM BROMIDE) 2 sprays bid  #1 x 5   Entered and Authorized by: Sarenity Ramaker Johnn Hai MD   Signed by: Irena Cords MD on 07/16/2006   Method used: Print then Give to Patient   RxID: [id number]

## 2006-07-26 NOTE — Unmapped (Signed)
Signed by Fanny Dance MA on 07/26/2006 at 09:38:07    Phone Note   Patient Call  Call back at Home Phone: [phone]  Caller: patient  Department: IM - General  Call for: [facility]    Reason for Call: refill medication  Pharmacy Information: [phone][facility]   Summary of Call: Pt was suppose to get 2 rx when she went to the pharmacy and only got one. She needs a refill of ATROVENT 0.03% SOLN and takes 2 SPRAYS twice a day. If you have any questions please call pt at [phone]       Prescriptions:  ATROVENT 0.03 % SOLN (IPRATROPIUM BROMIDE) 2 sprays bid  #1 x 1   Entered by: Fanny Dance MA   Authorized by: Irena Cords MD   Signed by: Fanny Dance MA on 07/26/2006   Method used: Telephoned to ...     [facility] [place] [facility]     [address]     [place], [place]  [address]     Ph: [phone]     Fax: [phone]   RxID: [id number]

## 2006-07-28 NOTE — Unmapped (Signed)
Signed by Irena Cords MD on 07/28/2006 at 00:00:00  Podiatry      Imported By: Fanny Dance MA 08/17/2006 13:27:30    _____________________________________________________________________    External Attachment:    Please see [employer] EMR for this document.

## 2006-09-15 NOTE — Unmapped (Signed)
Signed by Irena Cords MD on 09/15/2006 at 00:00:00  Ultrasound      Imported By: Casimer Lanius 11/10/2006 10:03:38    _____________________________________________________________________    External Attachment:    Please see [employer] EMR for this document.

## 2006-10-11 NOTE — Unmapped (Signed)
Signed by Catalina Pizza CMA on 10/11/2006 at 17:28:11    Phone Note   Patient Call  [facility] Cell Phone #: [phone]  Caller: patient  Department: IM - General  Call for: Dr Rico Junker    Summary of Call: Has heard that hormones, gels from OBGYN can effect her blood sugar, which is what she is due for at the end of her month. Taking Estrogen/Premarin and Prometrium.  Will finish in about six or seven days. Does she need to wait for her routine check up?     Initial call taken by: Devona Konig CMA,  October 11, 2006 9:06 AM      Follow-up for Phone Call   no do not wait  Follow-up by: Irena Cords MD,  October 11, 2006 12:49 PM    Additional Follow-up for Phone Call   patient notified not to wait, get blood work done.  phone call completed  Additional Follow-up by: Catalina Pizza CMA,  October 11, 2006 5:28 PM

## 2006-10-14 NOTE — Unmapped (Signed)
Signed by Catalina Pizza CMA on 10/14/2006 at 09:41:20    Prescriptions:  SINGULAIR 10 MG TAB (MONTELUKAST SODIUM) one by mouth at bedtime  #30 x 3   Entered by: Catalina Pizza CMA   Authorized by: Adisa Litt Johnn Hai MD   Signed by: Catalina Pizza CMA on 10/14/2006   Method used: Telephoned to ...     [facility] [place] [facility]     [address]     [place], [place]  [address]     Ph: [phone]     Fax: [phone]   RxID: [id number]

## 2006-10-25 LAB — COMPREHENSIVE METABOLIC PANEL
A/G Ratio: 1.7 (ref 1.0–2.1)
ALT: 25 U/L (ref 6–40)
AST: 26 U/L (ref 10–35)
Albumin: 4.7 g/dL (ref 3.6–5.1)
Alkaline Phosphatase: 68 U/L (ref 33–115)
BUN: 16 mg/dL (ref 7–25)
CO2: 29 mmol/L (ref 21–33)
Calcium: 10.1 mg/dL (ref 8.6–10.2)
Chloride: 98 mmol/L (ref 98–110)
Creatinine: 0.7 mg/dL (ref 0.50–1.20)
GFR MDRD Af Amer: >60 mL/min (ref >=60)
GFR MDRD Non Af Amer: >60 mL/min (ref >=60)
Globulin, Total: 2.8 g/dL (ref 2.2–3.9)
Glucose: 92 mg/dL (ref 65–99)
Potassium: 4.1 mmol/L (ref 3.5–5.3)
Sodium: 137 mmol/L (ref 135–146)
Total Bilirubin: 0.7 mg/dL (ref 0.2–1.2)
Total Protein: 7.5 g/dL (ref 6.2–8.3)

## 2006-10-25 LAB — LIPID PANEL
Chol/HDL Ratio: 4.3 (ref <=5.0)
Cholesterol, Total: 241 mg/dL (ref 125–200)
HDL: 56 mg/dL (ref >=46)
LDL Cholesterol: 143 mg/dL (ref <130)
Triglycerides: 209 mg/dL (ref <150)

## 2006-10-25 LAB — HEMOGLOBIN A1C: Hemoglobin A1C: 6.4 %

## 2006-10-25 LAB — VITAMIN D 25 HYDROXY
Vit D, 25-Hydroxy: 45 ng/mL (ref 20–100)
Vitamin D2, 1,25 (~~LOC~~)2: <4 ng/mL
Vitamin D3, 1,25 (~~LOC~~)2: 45 ng/mL

## 2006-10-25 NOTE — Unmapped (Signed)
Signed by Irena Cords MD on 10/26/2006 at 19:31:59  Patient: Wanda Rowe  Note: All result statuses are Final unless otherwise noted.    Tests: (1) COMPREHENSIVE METABOLIC PANEL W/EGFR (QDL-10231)    GLUCOSE                   92 mg/dL                    40-34                  FASTING REFERENCE INTERVAL    UREA NITROGEN (BUN)       16 mg/dL                    7-42    CREATININE                0.7 mg/dL                   0.50-1.20   eGFR NON-AFR. AMERICAN                              >60 mL/min/1.2m2           > OR = 60   eGFR AFRICAN AMERICAN                              >60 mL/min/1.38m2           > OR = 60   BUN/CREATININE RATIO (calc)                              NOT APPLICABLE              6-22      BUN/CREATININE RATIO IS NOT REPORTED WHEN THE BUN      AND CREATININE VALUES ARE WITHIN NORMAL LIMITS.    SODIUM                    137 mmol/L                  135-146    POTASSIUM                 4.1 mmol/L                  3.5-5.3    CHLORIDE                  98 mmol/L                   98-110    CARBON DIOXIDE            29 mmol/L                   21-33    CALCIUM                   10.1 mg/dL                  5.9-56.3    PROTEIN, TOTAL            7.5 g/dL                    8.7-5.6    ALBUMIN  4.7 g/dL                    4.4-0.1    GLOBULIN (calc)           2.8 g/dL                    0.2-7.2   ALBUMIN/GLOBULIN RATIO (calc)                              1.7                         1.0-2.1    BILIRUBIN, TOTAL          0.7 mg/dL                   5.3-6.6    ALKALINE PHOSPHATASE      68 U/L                      33-115    AST                       26 U/L                      10-35    ALT                       25 U/L                      6-40    Note: An exclamation mark (!) indicates a result that was not dispersed into   the flowsheet.  Document Creation Date: 10/25/2006 9:01 PM  _______________________________________________________________________    (1) Order result status:  Final  Collection or observation date-time: 10/25/2006 13:58  Requested date-time:   Receipt date-time: 10/25/2006 18:54  Reported date-time: 10/25/2006 20:00  Referring Physician:    Ordering Physician: Maliki Gignac (HOVERMAK)  Specimen Source: S  Source: [name] Order Number: [id number]  Lab site: [facility] [place]      [address]      [place]  [facility]  [address]      -----------------    The following non-numeric lab results were dispersed to  the flowsheet even though numeric results were expected:      BUN/CREATININE RATIO (calc), NOT APPLICABLE

## 2006-10-25 NOTE — Unmapped (Signed)
Signed by Irena Cords MD on 10/26/2006 at 19:32:29  Patient: Wanda Rowe  Note: All result statuses are Final unless otherwise noted.    Tests: (1) HEMOGLOBIN A1c (QDL-496)   HEMOGLOBIN A1c (% of total Hgb)                         [H]  6.4 %      NON-DIABETIC: <6.0%           Note: An exclamation mark (!) indicates a result that was not dispersed into   the flowsheet.  Document Creation Date: 10/26/2006 12:48 AM  _______________________________________________________________________    (1) Order result status: Final  Collection or observation date-time: 10/25/2006 13:58  Requested date-time:   Receipt date-time: 10/25/2006 18:54  Reported date-time: 10/26/2006 00:00  Referring Physician:    Ordering Physician: Adyn Serna (HOVERMAK)  Specimen Source: B  Source: [name] Order Number: [id number]  Lab site: [facility] [place]      [address]      [place]  [place]  [address]

## 2006-10-25 NOTE — Unmapped (Signed)
Signed by Irena Cords MD on 11/01/2006 at 00:28:32  Patient: Wanda Rowe  Note: All result statuses are Final unless otherwise noted.    Tests: (1) VITAMIN D, 25-HYDROXY, LC/MS/MS ([id number])   VITAMIN D, 25-[place], TOTAL                              45 ng/mL                    20-100    VITAMIN D, 25-[place], D3      45 ng/mL    VITAMIN D, 25-[place], D2      <4 ng/mL             25-OHD3 indicates both endogenous production and      supplementation. 25-OHD2 is an indicator of      exogenous sources such as diet or supplementation.      Therapy is based on measurement of Total 25-OHD,      with levels <20 ng/mL indicative of Vitamin D      deficiency while levels between 20 ng/mL and 30      ng/mL suggest insufficiency. Optimal levels are      >30 ng/mL.               Note: An exclamation mark (!) indicates a result that was not dispersed into   the flowsheet.  Document Creation Date: 10/31/2006 10:19 AM  _______________________________________________________________________    (1) Order result status: Final  Collection or observation date-time: 10/25/2006 13:58  Requested date-time:   Receipt date-time: 10/25/2006 18:54  Reported date-time: 10/31/2006 10:00  Referring Physician:    Ordering Physician: Elmire Amrein (HOVERMAK)  Specimen Source: S  Source: [name] Order Number: [id number]  Lab site: [facility] [place]      [address]      [place]  [place]  [address]      -----------------    The following non-numeric lab results were dispersed to  the flowsheet even though numeric results were expected:      VITAMIN D, 25-[place], D2, <4

## 2006-10-25 NOTE — Unmapped (Signed)
Signed by Irena Cords MD on 10/26/2006 at 19:31:59  Patient: Wanda Rowe  Note: All result statuses are Final unless otherwise noted.    Tests: (1) LIPID PANEL WITH REFLEX TO DIRECT LDL ([id number])    TRIGLYCERIDES        [H]  209 mg/dL                   <540    CHOLESTEROL, TOTAL   [H]  241 mg/dL                   981-191    HDL CHOLESTEROL           56 mg/dL                    > OR = 46   LDL-CHOLESTEROL (calc)                         [H]  143 mg/dL                   <478             DESIRABLE RANGE <100 MG/DL FOR PATIENTS WITH CHD OR      DIABETES AND <70 MG/DL FOR DIABETIC PATIENTS WITH      KNOWN HEART DISEASE.          CHOL/HDLC RATIO (calc)                              4.3                         < OR = 5.0    Note: An exclamation mark (!) indicates a result that was not dispersed into   the flowsheet.  Document Creation Date: 10/25/2006 9:01 PM  _______________________________________________________________________    (1) Order result status: Final  Collection or observation date-time: 10/25/2006 13:58  Requested date-time:   Receipt date-time: 10/25/2006 18:54  Reported date-time: 10/25/2006 20:00  Referring Physician:    Ordering Physician: Danaiya Steadman (HOVERMAK)  Specimen Source: S  Source: [name] Order Number: [id number]  Lab site: [facility] [place]      [address]      [place]  [place]  [address]

## 2006-11-03 NOTE — Unmapped (Signed)
Signed by Irena Cords MD on 11/03/2006 at 11:10:46      Reason for Visit   Chief Complaint: Check up, lab results    History from: patient    Allergies  ! SULFA    Medications   LOVASTATIN 10 MG TABS (LOVASTATIN) one by mouth daily  SINGULAIR 10 MG TAB (MONTELUKAST SODIUM) one by mouth at bedtime  NEURONTIN 300 MG CAPS (GABAPENTIN) one by mouth twice a day  FLONASE 50 MCG/ACT SUSPN (FLUTICASONE PROPIONATE (NASAL)) two sprays each nostril at bedtime  ATROVENT 0.03 % SOLN (IPRATROPIUM BROMIDE) 2 sprays bid  CO Q 10 60 MG CAPS (COENZYME Q10)   VALTREX 500 MG TABS (VALACYCLOVIR HCL) one by mouth dailey  * B12 500 MCG 1 qd  * CALCIUM CITRATE 1 qd  * FLAX SEED OIL 1000 2 qd  * VIT C 1000 qd  CO Q10 100 MG TABS (COENZYME Q10) 1 daily  BENICAR HCT 20-12.5 MG TABS (OLMESARTAN MEDOXOMIL-HCTZ) one by mouth daily  GLUCOPHAGE XR 500 MG TB24 (METFORMIN HCL) 1 qd      Vital Signs:   Wt: 275 lbs.      BMI: 40.76  BSA: 2.37  Wt chg (lbs): 4  Temperature: 98.9  degrees F  oral  BP: 128/84    Intake recorded by: Devona Konig CMA on November 03, 2006 10:15 AM        HPI Multiple Chronic   Additional Dx: stress and overeating x 1 moand gained 11 lbs. had chest pain end of june x 2 days and had 2 weeks ago for 2 days. constant pressure not worse with activity of eating. no triggered by a stressful day. not worse at night. also felt into next    ROS/Symptoms:   Patient denies any endocrine symptoms.  Patient denies any cardiovascular symptoms.  Patient denies any respiratory symptoms.          Physical Examination:   BP: 128/  84    Physical Exam- Detail:   General Appearance: well-developed, well-nourished and in no acute distress.  Respiratory: Respiration un-labored.  Lung fields clear to auscultation.  No wheezing, rales, rhonchi or pleural rub.  Cardiac: S1 and S2 normal.  RRR without murmurs, rubs, gallops.  No JVD.        Follow-up for Test Results:     New Problems:  CHEST PAIN (ICD-786.50)  New Medications:  LOVASTATIN 20  MG TABS (LOVASTATIN) one by mouth daily  LEXAPRO 10 MG TABS (ESCITALOPRAM OXALATE) one by mouth daily      Preventive Maintenance          Coordinating Care Providers   PCP Name: Dr. Gari Crown     Assessment and Plan  Status of Existing Problems:  Assessed HYPERGLYCEMIA as improved - cont glucophage - Earlena Werst K Beyonca Wisz MD - Signed  Assessed HYPERTENSION as unchanged - continue current medications   - Tamanika Heiney K Abdirahim Flavell MD - Signed  Assessed HYPERCHOLESTEROLEMIA as deteriorated - increase lovastatin - Akeria Hedstrom K Eveleigh Crumpler MD - Signed  Assessed ANXIETY as deteriorated - cont lexapro, no further wean on 1/2 qd - Ever Gustafson K Arnelle Nale MD - Signed  Assessed CHEST PAIN as new - ?esophageal, nexium if gets again wt loss - Sundeep Destin Johnn Hai MD - Signed  Assessed ASTHMA as unchanged - continue current medications   - Agripina Guyette Johnn Hai MD - Signed  Assessed CHEST PAIN as deteriorated - will use prilosec instead of nexium - Dasie Chancellor Johnn Hai MD  New Problems:  Dx of CHEST PAIN (ICD-786.50)  Onset: 11/03/2006    Medications   New Prescriptions/Refills:  LEXAPRO 10 MG TABS (ESCITALOPRAM OXALATE) one by mouth daily  #30 x 5, 11/03/2006, Shaquon Gropp K Ronit Cranfield MD  GLUCOPHAGE XR 500 MG TB24 (METFORMIN HCL) 1 qd  #90 x 2, 11/03/2006, Jaivian Battaglini K Petrina Melby MD  BENICAR HCT 20-12.5 MG TABS (OLMESARTAN MEDOXOMIL-HCTZ) one by mouth daily  #30 x 5, 11/03/2006, Maliek Schellhorn K Leemon Ayala MD  ATROVENT 0.03 % SOLN (IPRATROPIUM BROMIDE) 2 sprays bid  #3 x 2, 11/03/2006, Sie Formisano K Elynn Patteson MD  FLONASE 50 MCG/ACT SUSPN (FLUTICASONE PROPIONATE (NASAL)) two sprays each nostril at bedtime  #3 x 2, 11/03/2006, Marzelle Rutten K Phoua Hoadley MD  NEURONTIN 300 MG CAPS (GABAPENTIN) one by mouth twice a day  #180 x 2, 11/03/2006, Katie Faraone K Yenty Bloch MD  SINGULAIR 10 MG TAB (MONTELUKAST SODIUM) one by mouth at bedtime  #30 x 5, 11/03/2006, Zhamir Pirro K Konnor Jorden MD  LOVASTATIN 20 MG TABS (LOVASTATIN) one by mouth daily  #90 x 2, 11/03/2006, Kista Robb K Christain Mcraney MD    New medications:  LOVASTATIN 20 MG TABS -- one by mouth daily   Start date: 11/03/2006  LEXAPRO 10 MG TABS -- one by mouth daily  Start date: 11/03/2006      Today's Orders   99214 - Ofc Vst, Est Level IV [CPT-99214]  CMP   (METAPNL) (10231) [CPT-80053]  Lipid Profile   (FATS) (7600) [CPT-80061]  Hemoglobin  A1C   (GLYCO) (496) [CPT-83036]  TSH   (TSH) (899) [CPT-84443]  C-Reactive Protein, High Sensitivity    (CRPHS) (10124) [JYN-82956]              Prescriptions:  LEXAPRO 10 MG TABS (ESCITALOPRAM OXALATE) one by mouth daily  #30 x 5   Entered and Authorized by: Antonius Hartlage Johnn Hai MD   Signed by: Irena Cords MD on 11/03/2006   Method used: Print then Give to Patient   RxID: [id number]  GLUCOPHAGE XR 500 MG TB24 (METFORMIN HCL) 1 qd  #90 x 2   Entered and Authorized by: Tesia Lybrand Johnn Hai MD   Signed by: Irena Cords MD on 11/03/2006   Method used: Print then Give to Patient   RxID: [id number]  BENICAR HCT 20-12.5 MG TABS (OLMESARTAN MEDOXOMIL-HCTZ) one by mouth daily  #30 x 5   Entered and Authorized by: Vayla Wilhelmi Johnn Hai MD   Signed by: Irena Cords MD on 11/03/2006   Method used: Print then Give to Patient   RxID: [id number]  ATROVENT 0.03 % SOLN (IPRATROPIUM BROMIDE) 2 sprays bid  #3 x 2   Entered and Authorized by: Lurie Mullane Johnn Hai MD   Signed by: Irena Cords MD on 11/03/2006   Method used: Print then Give to Patient   RxID: [id number]  FLONASE 50 MCG/ACT SUSPN (FLUTICASONE PROPIONATE (NASAL)) two sprays each nostril at bedtime  #3 x 2   Entered and Authorized by: Victora Irby Johnn Hai MD   Signed by: Irena Cords MD on 11/03/2006   Method used: Print then Give to Patient   RxID: [id number]  NEURONTIN 300 MG CAPS (GABAPENTIN) one by mouth twice a day  #180 x 2   Entered and Authorized by: Tena Linebaugh Johnn Hai MD   Signed by: Irena Cords MD on 11/03/2006   Method used: Print then Give to Patient   RxID: [id number]  SINGULAIR 10 MG TAB (MONTELUKAST SODIUM) one by mouth at bedtime  #30 x 5   Entered and  Authorized by: Jaser Fullen Johnn Hai  MD   Signed by: Irena Cords MD on 11/03/2006   Method used: Print then Give to Patient   RxID: [id number]  LOVASTATIN 20 MG TABS (LOVASTATIN) one by mouth daily  #90 x 2   Entered and Authorized by: Denai Caba Johnn Hai MD   Signed by: Irena Cords MD on 11/03/2006   Method used: Print then Give to Patient   RxID: [id number]

## 2006-11-03 NOTE — Unmapped (Signed)
Signed by Irena Cords MD on 11/03/2006 at 00:00:00  Disclosure      Imported By: Fanny Dance MA 11/12/2006 11:00:42    _____________________________________________________________________    External Attachment:    Please see [employer] EMR for this document.

## 2006-12-03 NOTE — Unmapped (Signed)
Signed by Angelina Pih MPAS, PA-C on 12/10/2006 at 08:55:17    Phone Note   Patient Call  [facility] Cell Phone #: [phone]    Summary of Call: Patient called w/ questions regarding EGD/colo. Has had 2 colos w/ significant issues w/ N/V in past (2nd w/ incomplete prep d/t N/V). Took med in small green bottle last time and did fine. Will D/W Dr. Mickie Bail regarding Fleets vs. Visicol.     Initial call taken by: Angelina Pih MPAS, PA-C,  December 03, 2006 2:18 PM      Follow-up for Phone Call   OK to use Visicol. Left message for patient requesting call back w/ pharmacy number. [facility] to mail instructions to her.  Follow-up by: Angelina Pih MPAS, PA-C,  December 08, 2006 3:19 PM    Additional Follow-up for Phone Call   Spoke w/ patient today. Is aware can do Visicol prep. Has not scheduled procedure yet - will be calling [facility] to scheduled. I will let Selena Batten know to call in Visicol prep to pharmacy of [name] choice.  Additional Follow-up by: Angelina Pih MPAS, PA-C,  December 10, 2006 8:54 AM

## 2006-12-23 NOTE — Unmapped (Signed)
Signed by Arva Chafe on 12/23/2006 at 16:40:53    Printed Handout:  - VISICOL / Dulcolax Tablets Dosing Regimen

## 2006-12-24 NOTE — Unmapped (Signed)
Signed by Arva Chafe on 12/24/2006 at 14:54:03    [facility]  Division of Digestive Diseases        SURGERY / PROCEDURE SCHEDULE SHEET     Requested Date: 01/10/2007    Requested Time: 7:30 AM    Length of Surgery: 60 MINS      Physician: Lurene Shadow MD    Facility: [facility]    Patient is: Out Pt.    Anesthesia Type: IV Sedation    Medications:   LOVASTATIN 20 MG TABS (LOVASTATIN) one by mouth daily  SINGULAIR 10 MG TAB (MONTELUKAST SODIUM) one by mouth at bedtime  NEURONTIN 300 MG CAPS (GABAPENTIN) one by mouth twice a day  FLONASE 50 MCG/ACT SUSPN (FLUTICASONE PROPIONATE (NASAL)) two sprays each nostril at bedtime  ATROVENT 0.03 % SOLN (IPRATROPIUM BROMIDE) 2 sprays bid  CO Q 10 60 MG CAPS (COENZYME Q10)   VALTREX 500 MG TABS (VALACYCLOVIR HCL) one by mouth dailey  * B12 500 MCG 1 qd  * CALCIUM CITRATE 1 qd  * FLAX SEED OIL 1000 2 qd  * VIT C 1000 qd  CO Q10 100 MG TABS (COENZYME Q10) 1 daily  BENICAR HCT 20-12.5 MG TABS (OLMESARTAN MEDOXOMIL-HCTZ) one by mouth daily  GLUCOPHAGE XR 500 MG TB24 (METFORMIN HCL) 1 qd  LEXAPRO 10 MG TABS (ESCITALOPRAM OXALATE) one by mouth daily    Allergies: ! SULFA    Procedure:     Procedure: EGD    Diagnoses: HX OF GASTRIC/COLON POLYPS       Procedure: Colonoscopy    Patient Information:     Name: Wanda Rowe    DOB: February 11, 1960    SSN: [id number]    Address: [address]  [place], [place]  [address]    Gender: Female    Home phone: [phone]    Work phone: [phone]    IDX #: [id number]    Last Word #: [id number]    PCP: Dr. Gari Crown     Primary Insurance: [employer]    Member ID #: [id number]

## 2007-01-10 ENCOUNTER — Inpatient Hospital Stay

## 2007-01-10 NOTE — Unmapped (Signed)
Signed by Irena Cords MD on 01/10/2007 at 00:00:00  Colonoscopy & Endoscopy      Imported By: Sherrilyn Rist 02/01/2007 09:38:21    _____________________________________________________________________    External Attachment:    Please see [employer] EMR for this document.

## 2007-01-10 NOTE — Unmapped (Signed)
Signed by   [employer] on 01/10/2007 at 09:42:53  Patient: Wanda Rowe  Note: All result statuses are Final unless otherwise noted.    Tests: (1)  (MR)    Order Note:                              [facility]     PATIENT NAMEBELEM, Rowe                      MR #:  [id number]  DATE OF BIRTH:  Jan 02, 1961                        ACCOUNT #:  [id number]  PHYSICIAN:      Lurene Shadow, M.D.          ROOM #:  SERVICE:        Internal Medicine/Digestive Disease NURSING UNIT:  DICTATED BY:    Lurene Shadow, M.D.          [facility]:  S  PROCEDURE DATE: 01/10/2007                        ADMIT DATE:  01/10/2007                                                    DISCHARGE DATE:                                    ENDOSCOPY REPORT        HISTORY:  This is a 46 year old woman who has a prior history of hyperplastic  gastric polyps with advanced dysplasia that had been removed at another  institution in the past and is here for surveillance examination.  She had  her prior endoscopy a year ago.  She also has a family history of colon  cancer.  She was scheduled for upper and lower endoscopy today.     ANESTHESIA:  Sedation was administered using 50 mg of IV Benadryl, 8.5 mg of  IV midazolam and 125 mcg of fentanyl.     DETAILS OF PROCEDURE:  First a gastroscope was passed from the mouth as far  as the third portion of the duodenum.  The esophagus, Z line, GE junction and  stomach as well as the visualized duodenum all appear normal, say for a  trivial amount of gastritis, mostly in the body of the stomach.  There is no  erosions, ulcers, polyps, or evident neoplasia.  The gastroscope was removed,  the bed turned around, and an adult colonoscope was advanced from the anus as  far as the cecum as identified by the appendiceal orifice.  The patient had a  good bowel preparation her transverse colon is quite redundant making the  examination difficult.  She has no evidence of colitis, diverticulosis or  vascular  lesions.  There is one 6 mm sessile polyp in the transverse colon,  which was removed with a cold snare and submitted for histopathology.     IMPRESSION:     1. Essentially normal upper endoscopy.  2. Colon polyp in the transverse which was removed today.  RECOMMENDATIONS:   We will plan to repeat upper and lower endoscopy in three  years' time.                                                             _______________________________________  NS/tmg                                 _____  D:  01/10/2007 08:39                   Lurene Shadow, M.D.  T:  01/10/2007 09:29  Job #:  [id number]     c:   Jhane K. Hovermale, M.D.                                    ENDOSCOPY REPORT                                       COPY                    Page    1 of   1    Note: An exclamation mark (!) indicates a result that was not dispersed into   the flowsheet.  Document Creation Date: 01/10/2007 9:42 AM  _______________________________________________________________________    (1) Order result status: Final  Collection or observation date-time: 01/10/2007 00:00  Requested date-time:   Receipt date-time:   Reported date-time:   Referring Physician: Jerrel Ivory  Ordering Physician:  Reviewed [facility])  Specimen Source:   Source: DBS  Filler Order Number: [id number]  Lab site:

## 2007-01-10 NOTE — Unmapped (Signed)
[facility]     PATIENT NAMEJAMA, Wanda Rowe                      MR #:  [id number]   DATE OF BIRTH:  1960-08-23                        ACCOUNT #:  [id number]   PHYSICIAN:      Lurene Shadow, M.D.          ROOM #:   SERVICE:        Internal Medicine/Digestive Disease NURSING UNIT:   DICTATED BY:    Lurene Shadow, M.D.          [facility]:  S   PROCEDURE DATE: 01/10/2007                        ADMIT DATE:  01/10/2007                                                     DISCHARGE DATE:                                    ENDOSCOPY REPORT       HISTORY:  This is a 46 year old woman who has a prior history of hyperplastic   gastric polyps with advanced dysplasia that had been removed at another   institution in the past and is here for surveillance examination.  She had   her prior endoscopy a year ago.  She also has a family history of colon   cancer.  She was scheduled for upper and lower endoscopy today.     ANESTHESIA:  Sedation was administered using 50 mg of IV Benadryl, 8.5 mg of   IV midazolam and 125 mcg of fentanyl.     DETAILS OF PROCEDURE:  First a gastroscope was passed from the mouth as far   as the third portion of the duodenum.  The esophagus, Z line, GE junction and   stomach as well as the visualized duodenum all appear normal, say for a   trivial amount of gastritis, mostly in the body of the stomach.  There is no   erosions, ulcers, polyps, or evident neoplasia.  The gastroscope was removed,   the bed turned around, and an adult colonoscope was advanced from the anus as   far as the cecum as identified by the appendiceal orifice.  The patient had a   good bowel preparation her transverse colon is quite redundant making the   examination difficult.  She has no evidence of colitis, diverticulosis or   vascular lesions.  There is one 6 mm sessile polyp in the transverse colon,   which was removed with a cold snare and submitted for histopathology.     IMPRESSION:     1. Essentially normal upper endoscopy.   2. Colon polyp in the transverse which was removed today.     RECOMMENDATIONS:   We will plan to repeat upper and lower endoscopy in three   years' time.  _______________________________________   NS/tmg                                 _____   D:  01/10/2007 08:39                   Lurene Shadow, M.D.   T:  01/10/2007 09:29   Job #:  [id number]     c:   Avin K. Hovermale, M.D.                                    ENDOSCOPY REPORT                                        COPY                    Page    1 of   1                                        COPY                    Page    1 of   1

## 2007-01-10 NOTE — Unmapped (Signed)
Signed by Irena Cords MD on 01/10/2007 at 00:00:00  Endoscopy Report      Imported By: Vance Peper 01/13/2007 15:13:06    _____________________________________________________________________    External Attachment:    Please see [employer] EMR for this document.

## 2007-01-10 NOTE — Unmapped (Signed)
Signed by Lurene Shadow MD on 01/10/2007 at 00:00:00  EGD Photos      Imported By: Betsey Amen 02/22/2007 16:53:06    _____________________________________________________________________    External Attachment:    Please see [employer] EMR for this document.

## 2007-01-25 NOTE — Unmapped (Signed)
Signed by Angelina Pih MPAS, PA-C on 01/26/2007 at 09:29:39    Phone Note   Patient Call    [facility] Cell Phone #: [phone]  Caller: patient  Call for: [place]    Reason for call: Biopsy results from earlier this month.       Initial call taken by: Marinell Blight,  January 25, 2007 12:43 PM      Follow-up for Phone Call   Left VM back for patient. Polyp was adenomatous - repeat procedure in 3 years.  Follow-up by: Angelina Pih MPAS, PA-C,  January 26, 2007 9:29 AM

## 2007-02-01 LAB — TSH: TSH: 2.07 microintl units/mL

## 2007-02-01 LAB — COMPREHENSIVE METABOLIC PANEL
A/G Ratio: 1.9 (ref 1.0–2.1)
ALT: 42 units/L (ref 6–40)
AST: 29 units/L (ref 10–35)
Albumin: 4.9 g/dL (ref 3.6–5.1)
Alkaline Phosphatase: 70 units/L (ref 33–115)
BUN: 22 mg/dL (ref 7–25)
CO2: 28 mmol/L (ref 21–33)
Calcium: 10.2 mg/dL (ref 8.6–10.2)
Chloride: 102 mmol/L (ref 98–110)
Creatinine: 0.8 mg/dL (ref 0.50–1.20)
GFR MDRD Af Amer: >60 mL/min (ref >=60)
GFR MDRD Non Af Amer: >60 mL/min (ref >=60)
Globulin, Total: 2.6 g/dL (ref 2.2–3.9)
Glucose: 109 mg/dL (ref 65–99)
Potassium: 4.6 mmol/L (ref 3.5–5.3)
Sodium: 141 mmol/L (ref 135–146)
Total Bilirubin: 0.6 mg/dL (ref 0.2–1.2)
Total Protein: 7.5 g/dL (ref 6.2–8.3)

## 2007-02-01 LAB — LIPID PANEL
Chol/HDL Ratio: 3.1 (ref <=5.0)
Cholesterol, Total: 188 mg/dL (ref 125–200)
HDL: 60 mg/dL (ref >=46)
LDL Cholesterol: 103 mg/dL (ref <130)
Triglycerides: 124 mg/dL (ref <150)

## 2007-02-01 LAB — HEMOGLOBIN A1C: Hemoglobin A1C: 6.3 %

## 2007-02-01 NOTE — Unmapped (Signed)
Signed by Irena Cords MD on 02/02/2007 at 00:15:40  Patient: Wanda Rowe  Note: All result statuses are Final unless otherwise noted.    Tests: (1) TSH, 3RD GENERATION (QDL-899)    TSH, 3RD GENERATION       2.07 mIU/L      REFERENCE RANGE:              > OR = 20 YEARS: 0.40-4.50                   PREGNANCY RANGES       FIRST TRIMESTER    0.20-4.70       SECOND TRIMESTER   0.30-4.10       THIRD TRIMESTER    0.40-2.70                   REPORT COMMENT:      FASTING    Note: An exclamation mark (!) indicates a result that was not dispersed into   the flowsheet.  Document Creation Date: 02/01/2007 11:30 PM  _______________________________________________________________________    (1) Order result status: Final  Collection or observation date-time: 02/01/2007 12:14  Requested date-time:   Receipt date-time: 02/01/2007 20:27  Reported date-time: 02/01/2007 23:00  Referring Physician:    Ordering Physician: Zaide Mcclenahan (HOVERMAK)  Specimen Source: S  Source: [name] Order Number: [id number]  Lab site: [facility] [place]      [address]      [place]  [place]  [address]

## 2007-02-01 NOTE — Unmapped (Signed)
Signed by Irena Cords MD on 02/02/2007 at 00:16:10  Patient: Wanda Rowe  Note: All result statuses are Final unless otherwise noted.    Tests: (1) COMPREHENSIVE METABOLIC PANEL W/EGFR (QDL-10231)    GLUCOSE              [H]  109 mg/dL                   54-27                  FASTING REFERENCE INTERVAL    UREA NITROGEN (BUN)       22 mg/dL                    0-62    CREATININE                0.8 mg/dL                   0.50-1.20   eGFR NON-AFR. AMERICAN                              >60 mL/min/1.8m2           > OR = 60   eGFR AFRICAN AMERICAN                              >60 mL/min/1.66m2           > OR = 60   BUN/CREATININE RATIO (calc)                              NOT APPLICABLE              6-22      BUN/CREATININE RATIO IS NOT REPORTED WHEN THE BUN      AND CREATININE VALUES ARE WITHIN NORMAL LIMITS.    SODIUM                    141 mmol/L                  135-146    POTASSIUM                 4.6 mmol/L                  3.5-5.3    CHLORIDE                  102 mmol/L                  98-110    CARBON DIOXIDE            28 mmol/L                   21-33    CALCIUM                   10.2 mg/dL                  3.7-62.8    PROTEIN, TOTAL            7.5 g/dL                    3.1-5.1    ALBUMIN  4.9 g/dL                    1.6-1.0    GLOBULIN (calc)           2.6 g/dL                    9.6-0.4   ALBUMIN/GLOBULIN RATIO (calc)                              1.9                         1.0-2.1    BILIRUBIN, TOTAL          0.6 mg/dL                   5.4-0.9    ALKALINE PHOSPHATASE      70 U/L                      33-115    AST                       29 U/L                      10-35    ALT                  [H]  42 U/L                      6-40            REPORT COMMENT:      FASTING    Note: An exclamation mark (!) indicates a result that was not dispersed into   the flowsheet.  Document Creation Date: 02/01/2007 10:29  PM  _______________________________________________________________________    (1) Order result status: Final  Collection or observation date-time: 02/01/2007 12:14  Requested date-time:   Receipt date-time: 02/01/2007 20:27  Reported date-time: 02/01/2007 22:00  Referring Physician:    Ordering Physician: Kaicen Desena (HOVERMAK)  Specimen Source: S  Source: [name] Order Number: [id number]  Lab site: [facility] [place]      [address]      [place]  [facility]  [address]      -----------------    The following non-numeric lab results were dispersed to  the flowsheet even though numeric results were expected:      BUN/CREATININE RATIO (calc), NOT APPLICABLE

## 2007-02-01 NOTE — Unmapped (Signed)
Signed by Neil Crouch MD on 02/02/2007 at 14:08:58  Patient: Wanda Rowe  Note: All result statuses are Final unless otherwise noted.    Tests: (1) CARDIO CRP ([id number])  ! CARDIO CRP                1.1 mg/L             AVERAGE CARDIOVASCULAR RISK ACCORDING TO AHA/CDC       GUIDELINES.              FOR AGES > 17 YEARS:             CCRP MG/L    RISK ACCORDING TO AHA/CDC GUIDELINES        ---------    ------------------------------------      <1.0         LOW CARDIOVASCULAR RISK      1.0-3.0      AVERAGE CARDIOVASCULAR RISK      3.1-10.0     HIGH CARDIOVASCULAR RISK      >10.0        PERSISTENT ELEVATIONS MAY REPRESENT                    NON-CARDIOVASCULAR INFLAMMATION                   REPORT COMMENT:      FASTING    Note: An exclamation mark (!) indicates a result that was not dispersed into   the flowsheet.  Document Creation Date: 02/02/2007 12:03 PM  _______________________________________________________________________    (1) Order result status: Final  Collection or observation date-time: 02/01/2007 12:14  Requested date-time:   Receipt date-time: 02/01/2007 20:27  Reported date-time: 02/02/2007 11:00  Referring Physician:    Ordering Physician: Niajah Sipos (HOVERMAK)  Specimen Source: S  Source: [name] Order Number: [id number]  Lab site: [facility] [place]      [address]      [place]  [place]  [address]

## 2007-02-01 NOTE — Unmapped (Signed)
Signed by Irena Cords MD on 02/02/2007 at 00:16:10  Patient: Wanda Rowe  Note: All result statuses are Final unless otherwise noted.    Tests: (1) HEMOGLOBIN A1c (QDL-496)   HEMOGLOBIN A1c (% of total Hgb)                         [H]  6.3 %      NON-DIABETIC: <6.0%                   REPORT COMMENT:      FASTING    Note: An exclamation mark (!) indicates a result that was not dispersed into   the flowsheet.  Document Creation Date: 02/01/2007 10:57 PM  _______________________________________________________________________    (1) Order result status: Final  Collection or observation date-time: 02/01/2007 12:14  Requested date-time:   Receipt date-time: 02/01/2007 20:27  Reported date-time: 02/01/2007 22:00  Referring Physician:    Ordering Physician: Ailynn Gow (HOVERMAK)  Specimen Source: B  Source: [name] Order Number: [id number]  Lab site: [facility]       [address]      [place]  [place]  [address]

## 2007-02-01 NOTE — Unmapped (Signed)
Signed by Irena Cords MD on 02/02/2007 at 00:16:10  Patient: Wanda Rowe  Note: All result statuses are Final unless otherwise noted.    Tests: (1) LIPID PANEL (QDL-7600)    TRIGLYCERIDES             124 mg/dL                   <657    CHOLESTEROL, TOTAL        188 mg/dL                   846-962    HDL CHOLESTEROL           60 mg/dL                    > OR = 46   LDL-CHOLESTEROL (calc)                              103 mg/dL                   <952             DESIRABLE RANGE <100 MG/DL FOR PATIENTS WITH CHD OR      DIABETES AND <70 MG/DL FOR DIABETIC PATIENTS WITH      KNOWN HEART DISEASE.          CHOL/HDLC RATIO (calc)                              3.1                         < OR = 5.0    Note: An exclamation mark (!) indicates a result that was not dispersed into   the flowsheet.  Document Creation Date: 02/01/2007 10:29 PM  _______________________________________________________________________    (1) Order result status: Final  Collection or observation date-time: 02/01/2007 12:14  Requested date-time:   Receipt date-time: 02/01/2007 20:27  Reported date-time: 02/01/2007 22:00  Referring Physician:    Ordering Physician: Kinza Gouveia (HOVERMAK)  Specimen Source: S  Source: [name] Order Number: [id number]  Lab site: [facility] [place]      [address]      [place]  [place]  [address]

## 2007-02-09 NOTE — Unmapped (Signed)
Signed by Irena Cords MD on 02/09/2007 at 08:41:53      Reason for Visit   Chief Complaint: Follow up- and Med questions (lovastatin)    History from: patient    Allergies  ! SULFA    Medications   LOVASTATIN 20 MG TABS (LOVASTATIN) one by mouth daily  SINGULAIR 10 MG TAB (MONTELUKAST SODIUM) one by mouth at bedtime  NEURONTIN 300 MG CAPS (GABAPENTIN) one by mouth twice a day  FLONASE 50 MCG/ACT SUSPN (FLUTICASONE PROPIONATE (NASAL)) two sprays each nostril at bedtime  ATROVENT 0.03 % SOLN (IPRATROPIUM BROMIDE) 2 sprays bid  CO Q 10 60 MG CAPS (COENZYME Q10)   VALTREX 500 MG TABS (VALACYCLOVIR HCL) one by mouth dailey  * B12 500 MCG 1 qd  * CALCIUM CITRATE 1 qd  * FLAX SEED OIL 1000 2 qd  * VIT C 1000 qd  CO Q10 100 MG TABS (COENZYME Q10) 1 daily  BENICAR HCT 20-12.5 MG TABS (OLMESARTAN MEDOXOMIL-HCTZ) one by mouth daily  GLUCOPHAGE XR 500 MG TB24 (METFORMIN HCL) 1 qd  LEXAPRO 10 MG TABS (ESCITALOPRAM OXALATE) one by mouth daily      Vital Signs:   Wt: 274 lbs.      BMI: 40.61  BSA: 2.36  Wt chg (lbs): -1  BP: 122/84    Intake recorded by: Devona Konig CMA on February 09, 2007 8:08 AM    History of Present Illness   here for f/u back watching lifestyle, starting new job, anxiety stable. walking dog daily 45 mins. nocomplaints        ROS/Symptoms:   Patient denies any cardiovascular symptoms.  Patient denies any respiratory symptoms.  Patient denies any GI symptoms.  Patient denies any neurologic symptoms.          Physical Examination:   BP: 122/  84    Physical Exam- Detail:   General Appearance: well-developed, well-nourished and in no acute distress.  Skin: smal lac l lateral eye  Respiratory: Respiration un-labored.  Lung fields clear to auscultation.  No wheezing, rales, rhonchi or pleural rub.  Cardiac: S1 and S2 normal.  RRR without murmurs, rubs, gallops.  No JVD.  Vascular: No carotid bruits.  No edema or varicosities.           Follow-up for Test Results:     New Problems:  CONTUSION OF EYELIDS AND  PERIOCULAR AREA (ICD-921.1)  NEUROPATHY (ICD-355.9)  New Medications:  LOVASTATIN 10 MG TABS (LOVASTATIN) one by mouth daily  ALBUTEROL AERS (ALBUTEROL AERS) two puffs four times a day as needed  SYNTHROID 25 MCG TABS (LEVOTHYROXINE SODIUM) one by mouth daily      Preventive Maintenance       Coordinating Care Providers   PCP Name: Dr. Gari Crown             Prescriptions:  SYNTHROID 25 MCG TABS (LEVOTHYROXINE SODIUM) one by mouth daily  #30 x 5   Entered and Authorized by: Arvilla Salada Johnn Hai MD   Signed by: Irena Cords MD on 02/09/2007   Method used: Print then Give to Patient   RxID: [id number]  ALBUTEROL AERS (ALBUTEROL AERS) two puffs four times a day as needed  #1 x 5   Entered and Authorized by: Kaiven Vester Johnn Hai MD   Signed by: Irena Cords MD on 02/09/2007   Method used: Print then Give to Patient   RxID: [id number]  LOVASTATIN 10 MG TABS (LOVASTATIN) one by mouth daily  #30  x 5   Entered and Authorized by: Jupiter Kabir Johnn Hai MD   Signed by: Irena Cords MD on 02/09/2007   Method used: Print then Give to Patient   RxID: [id number]      Assessment and Plan  Status of Existing Problems:  Assessed HYPERCHOLESTEROLEMIA as improved - decrease lovastatin to 10 mg add synthroid 25 mcg - Nevaeh Korte K Kamya Watling MD - Signed  Assessed HYPERGLYCEMIA as improved - continue current medications   - Raelie Lohr K Oreta Soloway MD - Signed  Assessed HYPERTENSION as improved - continue current medications   - Mandela Bello K Eller Sweis MD - Signed  Assessed ANXIETY as improved - continue current medications 1/2 lexapro qd   - Jeral Zick K Garet Hooton MD - Signed  Assessed HYPERTRIGLYCERIDEMIA as improved - continue current medications   - Selah Zelman K Rayansh Herbst MD - Signed  Assessed NEUROPATHY as improved - cont neurontin - Jacque Byron K Neima Lacross MD - Signed  Assessed CONTUSION OF EYELIDS AND PERIOCULAR AREA as new - ? nuknown etiology superficial xybal call if worsens - Delisa Finck K Kayleigh Broadwell MD  New Problems:  Dx of CONTUSION OF EYELIDS AND PERIOCULAR AREA  (ICD-921.1)  Onset: 02/09/2007  Dx of NEUROPATHY (ICD-355.9)  Onset: 02/09/2007    Medications   New Prescriptions/Refills:  SYNTHROID 25 MCG TABS (LEVOTHYROXINE SODIUM) one by mouth daily  #30 x 5, 02/09/2007, Kyndall Chaplin K Nickolis Diel MD  ALBUTEROL AERS (ALBUTEROL AERS) two puffs four times a day as needed  #1 x 5, 02/09/2007, Donterrius Santucci K Mertis Mosher MD  LOVASTATIN 10 MG TABS (LOVASTATIN) one by mouth daily  #30 x 5, 02/09/2007, Jashley Yellin K Zyra Parrillo MD    New medications:  LOVASTATIN 10 MG TABS -- one by mouth daily  Start date: 02/09/2007  ALBUTEROL AERS -- two puffs four times a day as needed  Start date: 02/09/2007  SYNTHROID 25 MCG TABS -- one by mouth daily  Start date: 02/09/2007      Today's Orders   99214 - Ofc Vst, Est Level IV [CPT-99214]  CMP   (METAPNL) (10231) [CPT-80053]  Lipid Profile   (FATS) (7600) [CPT-80061]  TSH   (TSH) (899) [CPT-84443]  B-12  (B12)  (927) [CPT-82607]  Hemoglobin  A1C   (GLYCO) (496) [CPT-83036]  Iron & TIBC    (IS) (7573) [CPT-83540]

## 2007-02-09 NOTE — Unmapped (Signed)
Signed by Irena Cords MD on 02/09/2007 at 00:00:00  Privacy Notice      Imported By: Tomasita Morrow CMA 02/22/2007 16:27:35    _____________________________________________________________________    External Attachment:    Please see [employer] EMR for this document.

## 2007-02-21 NOTE — Unmapped (Signed)
Signed by Devona Konig CMA on 02/21/2007 at 16:58:20    Prescriptions:  SINGULAIR 10 MG TAB (MONTELUKAST SODIUM) one by mouth at bedtime  #30 x 3   Entered by: Devona Konig CMA   Authorized by: Livianna Petraglia Johnn Hai MD   Signed by: Devona Konig CMA on 02/21/2007   Method used: Telephoned to ...     [facility] [place] [facility]     [address]     [place], [place]  [address]     Ph: [phone]     Fax: [phone]   RxID: [id number]

## 2007-03-18 NOTE — Unmapped (Signed)
Signed by Irena Cords MD on 03/18/2007 at 00:00:00  physcian consent form anthem      Imported By: Link Snuffer 03/25/2007 15:02:51    _____________________________________________________________________    External Attachment:    Please see [employer] EMR for this document.

## 2007-03-24 NOTE — Unmapped (Signed)
Signed by Lurene Shadow MD on 03/24/2007 at 00:00:00  GI MSB Record      Imported By: Betsey Amen 03/24/2007 09:30:53    _____________________________________________________________________    External Attachment:    Please see [employer] EMR for this document.

## 2007-04-26 NOTE — Unmapped (Signed)
Signed by Neil Crouch MD on 04/26/2007 at 16:21:28      Reason for Visit   Chief Complaint: sinus pressure,pain,cough    History from: Wanda Rowe    Allergies  ! SULFA  Allergy and adverse reaction list reviewed during this update.      Medications   Current Meds:   LOVASTATIN 10 MG TABS (LOVASTATIN) one by mouth daily  SINGULAIR 10 MG TAB (MONTELUKAST SODIUM) one by mouth at bedtime  NEURONTIN 300 MG CAPS (GABAPENTIN) one by mouth twice a day  FLONASE 50 MCG/ACT SUSPN (FLUTICASONE PROPIONATE (NASAL)) two sprays each nostril at bedtime  ATROVENT 0.03 % SOLN (IPRATROPIUM BROMIDE) 2 sprays bid  CO Q 10 60 MG CAPS (COENZYME Q10)   VALTREX 500 MG TABS (VALACYCLOVIR HCL) one by mouth dailey  * B12 500 MCG 1 qd  * CALCIUM CITRATE 1 qd  * FLAX SEED OIL 1000 2 qd  * VIT C 1000 qd  CO Q10 100 MG TABS (COENZYME Q10) 1 daily  BENICAR HCT 20-12.5 MG TABS (OLMESARTAN MEDOXOMIL-HCTZ) one by mouth daily  GLUCOPHAGE XR 500 MG TB24 (METFORMIN HCL) 1 qd  LEXAPRO 10 MG TABS (ESCITALOPRAM OXALATE) one by mouth daily  ALBUTEROL AERS (ALBUTEROL AERS) two puffs four times a day as needed  SYNTHROID 25 MCG TABS (LEVOTHYROXINE SODIUM) one by mouth daily        Vital Signs:   Ht: 69 in.  Wt: 277 lbs.      BMI: 41.05  BSA: 2.37  Wt chg (lbs): 3  Temperature: 98.8  degrees F  oral  Pulse: 80 (regular)  Resp: 12  Comments: started last wed  BP: 130/78    Intake recorded by: Durward Parcel  LPN on April 26, 2007 4:00 PM        URI/LRI Symptoms:   Chief Compliant: sinus  History from: Wanda Rowe  Onset: 7 days  Context: 47yo female for acute visit c/o sinusitis  sinus pressure, cough productive sputum and pain in face + dizzy.    Quality- cough: productive    Previous Treatments:   Ineffective OTC Treatment: Mucinex x 1 week      Associated Symptoms:   Complains of: fatigue, nasal congestion, cough  Denies: fever    Past History  Past Medical History (reviewed - no changes required):  hbp  asthma and allergies  Social History (reviewed -  no changes required): Marital Status: single,   Children: 1,   Alcohol Use: occasionally  Tobacco Usage:non-smoker    Review of Systems   Refer to HPI for review of systems documentation.      Physical Examination:   BP: 130/  78    Physical Exam- Detail:   General Appearance: well-developed, well-nourished and in no acute distress.  Ears: + has blue PE tube R ear.  otherwise tympanic membranes appear normal bilat.  Nose/Face: Abnormal - boggy nasal edema  + face tenderness  Oropharynx: + swollen tonsils no erythema or exudates  Respiratory: + diminished breath sounds bilat.  no crackles, ronchii, or wheezing           Follow-up for Test Results:     New Problems:  SINUSITIS, ACUTE (ICD-461.9)  New Medications:  AUGMENTIN 875-125 MG TABS (AMOXICILLIN-POT CLAVULANATE) one by mouth twice daily until gone  New Allergies:  ! SULFA    Preventive Maintenance       Coordinating Care Providers   PCP Name: Dr. Gari Crown       Wanda Rowe Education  Education was provided to: Wanda Rowe    Topics Discussed:   Medical Condition, Treatment Options, Medication Use.            Prescriptions:  AUGMENTIN 875-125 MG TABS (AMOXICILLIN-POT CLAVULANATE) one by mouth twice daily until gone  #20 x 0   Entered and Authorized by: Neil Crouch MD   Signed by: Neil Crouch MD on 04/26/2007   Method used: Print then Give to Wanda Rowe   RxID: [id number]      Assessment and Plan  Status of Existing Problems:  Assessed SINUSITIS, ACUTE as new - #1  SINUSITIS -  Rx Augmentin 875/125mg  #20 twice a day x 10d ... mucinex as needed for congestion   - Neil Crouch MD  New Problems:  Dx of SINUSITIS, ACUTE (ICD-461.9)  Onset: 04/26/2007    Medications   New Prescriptions/Refills:  AUGMENTIN 875-125 MG TABS (AMOXICILLIN-POT CLAVULANATE) one by mouth twice daily until gone  #20 x 0, 04/26/2007, Neil Crouch MD    New medications:  AUGMENTIN 875-125 MG TABS -- one by mouth twice daily until gone  Start date:  04/26/2007      Today's Orders   99213 - Ofc Vst, Est Level III [CPT-99213]    Disposition:   as needed

## 2007-05-13 LAB — IRON STUDIES
% Iron Saturation: 19 % (ref 15–50)
Iron: 73 ug/dL (ref 40–175)
TIBC: 386 ug/dL (ref 250–450)

## 2007-05-13 LAB — COMPREHENSIVE METABOLIC PANEL
A/G Ratio: 1.5 (ref 1.0–2.1)
ALT: 30 units/L (ref 6–40)
AST: 23 units/L (ref 10–35)
Albumin: 4.4 g/dL (ref 3.6–5.1)
Alkaline Phosphatase: 66 units/L (ref 33–115)
BUN: 15 mg/dL (ref 7–25)
CO2: 27 mmol/L (ref 21–33)
Calcium: 9.7 mg/dL (ref 8.6–10.2)
Chloride: 102 mmol/L (ref 98–110)
Creatinine: 0.7 mg/dL (ref 0.59–1.07)
GFR MDRD Af Amer: >60 mL/min (ref >=60)
GFR MDRD Non Af Amer: >60 mL/min (ref >=60)
Globulin, Total: 2.9 g/dL (ref 2.2–3.9)
Glucose: 122 mg/dL (ref 65–99)
Potassium: 3.9 mmol/L (ref 3.5–5.3)
Sodium: 139 mmol/L (ref 135–146)
Total Bilirubin: 0.5 mg/dL (ref 0.2–1.2)
Total Protein: 7.3 g/dL (ref 6.2–8.3)

## 2007-05-13 LAB — LIPID PANEL
Chol/HDL Ratio: 3.9 (ref <=5.0)
Cholesterol, Total: 180 mg/dL (ref 125–200)
HDL: 46 mg/dL (ref >=46)
LDL Cholesterol: 83 mg/dL (ref <130)
Triglycerides: 257 mg/dL (ref <150)

## 2007-05-13 LAB — TSH: TSH: 1.98 u[IU]/mL

## 2007-05-13 LAB — HEMOGLOBIN A1C: Hemoglobin A1C: 6.4 % — ABNORMAL HIGH

## 2007-05-13 LAB — VITAMIN B12: Vitamin B-12: 395 pg/mL (ref 200–1100)

## 2007-05-13 NOTE — Unmapped (Signed)
Signed by Irena Cords MD on 05/16/2007 at 05:36:46  Patient: Wanda Rowe  Note: All result statuses are Final unless otherwise noted.    Tests: (1) IRON AND TOTAL IRON BINDING CAPACITY (QDL-7573)    IRON, TOTAL               73 mcg/dL                   42-595   IRON BINDING CAPACITY                              386 mcg/dL                  638-756    % SATURATION (calc)       19 %                        15-50    Note: An exclamation mark (!) indicates a result that was not dispersed into   the flowsheet.  Document Creation Date: 05/13/2007 6:18 PM  _______________________________________________________________________    (1) Order result status: Final  Collection or observation date-time: 05/13/2007 08:20  Requested date-time:   Receipt date-time: 05/13/2007 08:21  Reported date-time: 05/13/2007 18:00  Referring Physician:    Ordering Physician: Melbert Botelho (HOVERMAK)  Specimen Source: S  Source: [name] Order Number: [id number]  Lab site: [facility] [place]      [address]      [place]  [place]  [address]

## 2007-05-13 NOTE — Unmapped (Signed)
Signed by Irena Cords MD on 05/16/2007 at 05:36:46  Patient: Siraj Dermody Spieker  Note: All result statuses are Final unless otherwise noted.    Tests: (1) VITAMIN B12 (QDL-927)    VITAMIN B12               395 pg/mL                   [id number]             PLEASE NOTE: ALTHOUGH THE REFERENCE RANGE FOR VITAMIN      B12 IS [id number] PG/ML, IT HAS BEEN REPORTED THAT BETWEEN      5 AND 10% OF PATIENTS WITH VALUES BETWEEN 200 AND 400      PG/ML MAY EXPERIENCE NEUROPSYCHIATRIC AND HEMATOLOGIC      ABNORMALITIES DUE TO OCCULT B12 DEFICIENCY; LESS THAN 1%      OF PATIENTS WITH VALUES ABOVE 400 PG/ML WILL HAVE SYMPTOMS.            REPORT COMMENT:      FASTING    Note: An exclamation mark (!) indicates a result that was not dispersed into   the flowsheet.  Document Creation Date: 05/13/2007 8:44 PM  _______________________________________________________________________    (1) Order result status: Final  Collection or observation date-time: 05/13/2007 08:20  Requested date-time:   Receipt date-time: 05/13/2007 08:21  Reported date-time: 05/13/2007 20:00  Referring Physician:    Ordering Physician: Komal Stangelo (HOVERMAK)  Specimen Source: S  Source: [name] Order Number: [id number]  Lab site: [facility] [place]      [address]      [place]  [place]  [address]

## 2007-05-13 NOTE — Unmapped (Signed)
Signed by Irena Cords MD on 05/16/2007 at 05:36:46  Patient: Landin Tallon Markel  Note: All result statuses are Final unless otherwise noted.    Tests: (1) COMPREHENSIVE METABOLIC PANEL W/EGFR (QDL-10231)    GLUCOSE              [H]  122 mg/dL                   28-41                  FASTING REFERENCE INTERVAL    UREA NITROGEN (BUN)       15 mg/dL                    3-24    CREATININE                0.7 mg/dL                   0.59-1.07   eGFR NON-AFR. AMERICAN                              >60 mL/min/1.62m2           > OR = 60   eGFR AFRICAN AMERICAN                              >60 mL/min/1.24m2           > OR = 60   BUN/CREATININE RATIO (calc)                              NOT APPLICABLE              6-22      BUN/CREATININE RATIO IS NOT REPORTED WHEN THE BUN      AND CREATININE VALUES ARE WITHIN NORMAL LIMITS.    SODIUM                    139 mmol/L                  135-146    POTASSIUM                 3.9 mmol/L                  3.5-5.3    CHLORIDE                  102 mmol/L                  98-110    CARBON DIOXIDE            27 mmol/L                   21-33    CALCIUM                   9.7 mg/dL                   4.0-10.2    PROTEIN, TOTAL            7.3 g/dL                    7.2-5.3    ALBUMIN  4.4 g/dL                    7.8-4.6    GLOBULIN (calc)           2.9 g/dL                    9.6-2.9   ALBUMIN/GLOBULIN RATIO (calc)                              1.5                         1.0-2.1    BILIRUBIN, TOTAL          0.5 mg/dL                   5.2-8.4    ALKALINE PHOSPHATASE      66 U/L                      33-115    AST                       23 U/L                      10-35    ALT                       30 U/L                      6-40            REPORT COMMENT:      FASTING    Note: An exclamation mark (!) indicates a result that was not dispersed into   the flowsheet.  Document Creation Date: 05/13/2007 6:18  PM  _______________________________________________________________________    (1) Order result status: Final  Collection or observation date-time: 05/13/2007 08:20  Requested date-time:   Receipt date-time: 05/13/2007 08:21  Reported date-time: 05/13/2007 18:00  Referring Physician:    Ordering Physician: Shalev Helminiak (HOVERMAK)  Specimen Source: S  Source: [name] Order Number: [id number]  Lab site: [facility] [place]      [address]      [place]  [facility]  [address]      -----------------    The following non-numeric lab results were dispersed to  the flowsheet even though numeric results were expected:      BUN/CREATININE RATIO (calc), NOT APPLICABLE

## 2007-05-13 NOTE — Unmapped (Signed)
Signed by Irena Cords MD on 05/16/2007 at 05:36:46  Patient: Wanda Rowe  Note: All result statuses are Final unless otherwise noted.    Tests: (1) HEMOGLOBIN A1c (QDL-496)   HEMOGLOBIN A1c (% of total Hgb)                         [H]  6.4 %      NON-DIABETIC: <6.0%                   REPORT COMMENT:      FASTING    Note: An exclamation mark (!) indicates a result that was not dispersed into   the flowsheet.  Document Creation Date: 05/13/2007 6:44 PM  _______________________________________________________________________    (1) Order result status: Final  Collection or observation date-time: 05/13/2007 08:20  Requested date-time:   Receipt date-time: 05/13/2007 08:21  Reported date-time: 05/13/2007 18:00  Referring Physician:    Ordering Physician: Denzal Meir (HOVERMAK)  Specimen Source: B  Source: [name] Order Number: [id number]  Lab site: [facility] [place]      [address]      [place]  [place]  [address]

## 2007-05-13 NOTE — Unmapped (Signed)
Signed by Irena Cords MD on 05/16/2007 at 05:36:46  Patient: Wanda Rowe  Note: All result statuses are Final unless otherwise noted.    Tests: (1) TSH, 3RD GENERATION (QDL-899)    TSH, 3RD GENERATION       1.98 mIU/L      REFERENCE RANGE:              > OR = 20 YEARS: 0.40-4.50                   PREGNANCY RANGES       FIRST TRIMESTER    0.20-4.70       SECOND TRIMESTER   0.30-4.10       THIRD TRIMESTER    0.40-2.70                   REPORT COMMENT:      FASTING    Note: An exclamation mark (!) indicates a result that was not dispersed into   the flowsheet.  Document Creation Date: 05/13/2007 8:03 PM  _______________________________________________________________________    (1) Order result status: Final  Collection or observation date-time: 05/13/2007 08:20  Requested date-time:   Receipt date-time: 05/13/2007 08:21  Reported date-time: 05/13/2007 19:00  Referring Physician:    Ordering Physician: Zailynn Brandel (HOVERMAK)  Specimen Source: S  Source: [name] Order Number: [id number]  Lab site: [facility] [place]      [address]      [place]  [place]  [address]

## 2007-05-13 NOTE — Unmapped (Signed)
Signed by Irena Cords MD on 05/16/2007 at 05:36:16  Patient: Wanda Rowe  Note: All result statuses are Final unless otherwise noted.    Tests: (1) LIPID PANEL (QDL-7600)    TRIGLYCERIDES        [H]  257 mg/dL                   <132    CHOLESTEROL, TOTAL        180 mg/dL                   440-102    HDL CHOLESTEROL           46 mg/dL                    > OR = 46   LDL-CHOLESTEROL (calc)                              83 mg/dL                    <725             DESIRABLE RANGE <100 MG/DL FOR PATIENTS WITH CHD OR      DIABETES AND <70 MG/DL FOR DIABETIC PATIENTS WITH      KNOWN HEART DISEASE.          CHOL/HDLC RATIO (calc)                              3.9                         < OR = 5.0    Note: An exclamation mark (!) indicates a result that was not dispersed into   the flowsheet.  Document Creation Date: 05/13/2007 6:18 PM  _______________________________________________________________________    (1) Order result status: Final  Collection or observation date-time: 05/13/2007 08:20  Requested date-time:   Receipt date-time: 05/13/2007 08:21  Reported date-time: 05/13/2007 18:00  Referring Physician:    Ordering Physician: Markisha Meding (HOVERMAK)  Specimen Source: S  Source: [name] Order Number: [id number]  Lab site: [facility] [place]      [address]      [place]  [place]  [address]

## 2007-05-20 DIAGNOSIS — E039 Hypothyroidism, unspecified: Secondary | ICD-10-CM | POA: Insufficient documentation

## 2007-05-20 NOTE — Unmapped (Addendum)
Signed by Irena Cords MD on 05/20/2007 at 10:02:12      Reason for Visit   Chief Complaint: Follow up on medication    History from: patient    Allergies  ! SULFA    Medications   LOVASTATIN 10 MG TABS (LOVASTATIN) one by mouth daily  SINGULAIR 10 MG TAB (MONTELUKAST SODIUM) one by mouth at bedtime  NEURONTIN 300 MG CAPS (GABAPENTIN) one by mouth twice a day  FLONASE 50 MCG/ACT SUSPN (FLUTICASONE PROPIONATE (NASAL)) two sprays each nostril at bedtime  ATROVENT 0.03 % SOLN (IPRATROPIUM BROMIDE) 2 sprays bid  CO Q 10 60 MG CAPS (COENZYME Q10)   VALTREX 500 MG TABS (VALACYCLOVIR HCL) one by mouth dailey  * B12 500 MCG 1 qd  * CALCIUM CITRATE 1 qd  * FLAX SEED OIL 1000 2 qd  * VIT C 1000 qd  CO Q10 100 MG TABS (COENZYME Q10) 1 daily  BENICAR HCT 20-12.5 MG TABS (OLMESARTAN MEDOXOMIL-HCTZ) one by mouth daily  GLUCOPHAGE XR 500 MG TB24 (METFORMIN HCL) 1 qd  LEXAPRO 10 MG TABS (ESCITALOPRAM OXALATE) one by mouth daily  ALBUTEROL AERS (ALBUTEROL AERS) two puffs four times a day as needed  SYNTHROID 25 MCG TABS (LEVOTHYROXINE SODIUM) one by mouth daily      Vital Signs:   Wt: 280 lbs.      BMI: 41.50  BSA: 2.39  Wt chg (lbs): 3  BP: 132/80  Cuff size: regular    Intake recorded by: Devona Konig CMA on May 20, 2007 9:19 AM        HPI Multiple Chronic   Additional Dx: having chest pain with new with more sressful job, not worse with exertion or with food. only on 1/2 dose of lexapro. doesnt notice anything with synthroid. not watching diet    Current Status:     Compliance with diet: poor  Compliance with exercise: poor    ROS/Symptoms:   Patient denies any endocrine symptoms.  Patient complains of the following cardiovascular symptoms: see above  Patient denies any respiratory symptoms.  Patient denies any GI symptoms.  Patient denies any neurologic symptoms.          Physical Examination:   BP: 132/  80    Physical Exam- Detail:   General Appearance: well-developed, well-nourished and in no acute  distress.  Respiratory: Respiration un-labored.  Lung fields clear to auscultation.  No wheezing, rales, rhonchi or pleural rub.  Cardiac: S1 and S2 normal.  RRR without murmurs, rubs, gallops.  No JVD.  Vascular: No carotid bruits.  No edema or varicosities.           New Problems:  HYPOTHYROIDISM (ICD-244.9)  HYPOTHYROIDISM (ICD-244.9)  New Medications:  SYNTHROID 50 MCG TABS (LEVOTHYROXINE SODIUM) one by mouth daily      Preventive Maintenance       Coordinating Care Providers   PCP Name: Dr. Gari Crown             Prescriptions:  LEXAPRO 10 MG TABS (ESCITALOPRAM OXALATE) one by mouth daily  #90 x 2   Entered and Authorized by: Bralynn Donado Johnn Hai MD   Signed by: Irena Cords MD on 05/20/2007   Method used: Print then Give to Patient   RxID: [id number]  GLUCOPHAGE XR 500 MG TB24 (METFORMIN HCL) 1 qd  #90 x 2   Entered and Authorized by: Lyndall Bellot Johnn Hai MD   Signed by: Irena Cords MD on 05/20/2007   Method used:  Print then Give to Patient   RxID: [id number]  ATROVENT 0.03 % SOLN (IPRATROPIUM BROMIDE) 2 sprays bid  #3 x 2   Entered and Authorized by: Liese Dizdarevic Johnn Hai MD   Signed by: Irena Cords MD on 05/20/2007   Method used: Print then Give to Patient   RxID: [id number]  FLONASE 50 MCG/ACT SUSPN (FLUTICASONE PROPIONATE (NASAL)) two sprays each nostril at bedtime  #3 x 2   Entered and Authorized by: Jarquis Walker Johnn Hai MD   Signed by: Irena Cords MD on 05/20/2007   Method used: Print then Give to Patient   RxID: [id number]  NEURONTIN 300 MG CAPS (GABAPENTIN) one by mouth twice a day  #180 x 2   Entered and Authorized by: Quantavious Eggert Johnn Hai MD   Signed by: Irena Cords MD on 05/20/2007   Method used: Print then Give to Patient   RxID: [id number]  SINGULAIR 10 MG TAB (MONTELUKAST SODIUM) one by mouth at bedtime  #90 x 2   Entered and Authorized by: Han Lysne Johnn Hai MD   Signed by: Irena Cords MD on 05/20/2007   Method used: Print then Give to  Patient   RxID: [id number]  SYNTHROID 50 MCG TABS (LEVOTHYROXINE SODIUM) one by mouth daily  #30 x 5   Entered and Authorized by: Tinlee Navarrette Johnn Hai MD   Signed by: Irena Cords MD on 05/20/2007   Method used: Print then Give to Patient   RxID: [id number]      Assessment and Plan  Status of Existing Problems:  Assessed HYPERGLYCEMIA as deteriorated - glucophage - Gearlene Godsil K Leliana Kontz MD  Assessed HYPERTRIGLYCERIDEMIA as improved - continue current medications   - Bethan Adamek K Benaiah Behan MD  Assessed HYPERCHOLESTEROLEMIA as improved - d/c lovastatin   - Maelys Kinnick K Bethenny Losee MD  Assessed HYPERTENSION as deteriorated - watch at home and call if >130/90 - Dakota Vanwart K Allisha Harter MD  Assessed HYPOTHYROIDISM as improved - increase to 50 mcg - Jeania Nater K Pearson Reasons MD  Assessed HYPOTHYROIDISM as improved - increase to 50 - Marshaun Lortie K El Pile MD  Assessed ANXIETY as deteriorated - cause of chest tightness, increase lexapro - Sunjai Levandoski K Cordera Stineman MD  Assessed B12 DEFICIENCY as unchanged - trial im to see if helps mood, energy - Azalynn Maxim K Anaise Sterbenz MD  New Problems:  Dx of HYPOTHYROIDISM (ICD-244.9)  Onset: 05/20/2007  Dx of HYPOTHYROIDISM (ICD-244.9)  Onset: 05/20/2007    Medications   New Prescriptions/Refills:  LEXAPRO 10 MG TABS (ESCITALOPRAM OXALATE) one by mouth daily  #90 x 2, 05/20/2007, Ildefonso Keaney K Manaia Samad MD  GLUCOPHAGE XR 500 MG TB24 (METFORMIN HCL) 1 qd  #90 x 2, 05/20/2007, Trinda Harlacher K Jerline Linzy MD  ATROVENT 0.03 % SOLN (IPRATROPIUM BROMIDE) 2 sprays bid  #3 x 2, 05/20/2007, Khiara Shuping K Raeli Wiens MD  FLONASE 50 MCG/ACT SUSPN (FLUTICASONE PROPIONATE (NASAL)) two sprays each nostril at bedtime  #3 x 2, 05/20/2007, Raesean Bartoletti K Arty Lantzy MD  NEURONTIN 300 MG CAPS (GABAPENTIN) one by mouth twice a day  #180 x 2, 05/20/2007, Lavaya Defreitas K Ashtan Girtman MD  SINGULAIR 10 MG TAB (MONTELUKAST SODIUM) one by mouth at bedtime  #90 x 2, 05/20/2007, Aoi Kouns K Daishia Fetterly MD  SYNTHROID 50 MCG TABS (LEVOTHYROXINE SODIUM) one by mouth daily  #30 x 5, 05/20/2007, Jahkai Yandell K Japheth Diekman MD    New  medications:  SYNTHROID 50 MCG TABS -- one by mouth daily  Start date: 05/20/2007      Today's Orders   99214 - Ofc Vst, Est Level IV [  CPT-99214]  CMP   (METAPNL) (10231) [CPT-80053]  Lipid Profile   (FATS) (7600) [CPT-80061]  TSH   (TSH) (899) [CPT-84443]  B-12  (B12)  (927) [CPT-82607]  Vitamin D, 25-Hydrodroxy  (VITMD25) (17306) [CPT-82306]  Admin. Fee Antibiotic/Therapeutic/Diagnostic [CPT-96372]  B12 IM (office supplied B12) [CPT-J3420]                    Signed by Irena Cords MD on 05/20/2007 at 13:54:33    also c/o recurrent yeast id recurrent under breast, and skin tags and brown lesion under l elbow nystatin powder for yeast, derm for tags

## 2007-06-22 NOTE — Unmapped (Signed)
Signed by Emiliano Dyer MA on 06/22/2007 at 15:28:36    Phone Note   Call from Pharmacy    Pharmacy Name: [facility]  Caller: erica   Pharmacy Phone Number: [phone]   Call for: dr Rico Junker   Summary of call: has a script for singulair 10 millgrams written 10.01.2008 can you call [facility] for a updated date. it is to old to fill   Initial call taken by: Avanell Shackleton,  Jun 22, 2007 11:07 AM      Follow-up for Phone Call   PHONED IN SCRIPT AND NOTIFIED PT   phone call completed  Follow-up by: Emiliano Dyer MA,  Jun 22, 2007 3:28 PM    Prescriptions:  SINGULAIR 10 MG TAB (MONTELUKAST SODIUM) one by mouth at bedtime  #90 x 0   Entered and Authorized by: Romana Deaton Johnn Hai MD   Signed by: Irena Cords MD on 06/22/2007   Method used: Print then Give to Patient   RxID: [id number]

## 2007-06-29 NOTE — Unmapped (Signed)
Signed by Emiliano Dyer MA on 06/29/2007 at 10:50:27    Phone Note   Call from Pharmacy  Caller: patient  Department: IM - General  Call for: DR [facility]    Pharmacy Name: [facility]  Caller: ERICA   Pharmacy Phone Number: [phone]   Call for: DR [facility]   Summary of call: fax over 2 times a refill request for valtrex they gave her 4 pills did you get the request pharm will fax another request today thanks call pharm.   Initial call taken by: Avanell Shackleton,  Jun 29, 2007 10:43 AM      Follow-up for Phone Call   prescription resent  Follow-up by: Emiliano Dyer MA,  Jun 29, 2007 10:49 AM

## 2007-06-29 NOTE — Unmapped (Signed)
Signed by Emiliano Dyer MA on 06/29/2007 at 16:22:56    Prescriptions:  VALTREX 500 MG TABS (VALACYCLOVIR HCL) one by mouth dailey  #30 x 5   Entered by: Emiliano Dyer MA   Authorized by: Irena Cords MD   Signed by: Maryanna Shape Brockert MA on 06/29/2007   Method used: Telephoned to ...     [facility] [place] [facility]     [address]     [place], [place]  [address]     Ph: [phone]     Fax: [phone]   RxID: [id number]

## 2007-07-20 NOTE — Unmapped (Signed)
Signed by Emiliano Dyer MA on 07/20/2007 at 15:49:02    Prescriptions:  BENICAR HCT 20-12.5 MG TABS (OLMESARTAN MEDOXOMIL-HCTZ) one by mouth daily  #30 x 1   Entered by: Emiliano Dyer MA   Authorized by: Irena Cords MD   Signed by: Maryanna Shape Brockert MA on 07/20/2007   Method used: Telephoned to ...     [facility] [place] [facility]     [address]     [place], [place]  [address]     Ph: [phone]     Fax: [phone]   RxID: [id number]

## 2007-08-29 NOTE — Unmapped (Signed)
Signed by Fanny Dance MA on 08/29/2007 at 17:25:29    Prescriptions:  LEXAPRO 10 MG TABS (ESCITALOPRAM OXALATE) one by mouth daily  #30 x 1   Entered by: Fanny Dance MA   Authorized by: Irena Cords MD   Signed by: Fanny Dance MA on 08/29/2007   Method used: Telephoned to ...     [facility] [place] [facility]     [address]     [place], [place]  [address]     Ph: [phone]     Fax: [phone]   RxID: [id number]

## 2007-09-26 NOTE — Unmapped (Signed)
Signed by Emiliano Dyer MA on 09/26/2007 at 14:45:08    Prescriptions:  GLUCOPHAGE XR 500 MG TB24 (METFORMIN HCL) 1 qd  #90 x 2   Entered by: Emiliano Dyer MA   Authorized by: Irena Cords MD   Signed by: Maryanna Shape Brockert MA on 09/26/2007   Method used: Telephoned to ...     [facility] [place] [facility]     [address]     [place], [place]  [address]     Ph: [phone]     Fax: [phone]   RxID: [id number]

## 2007-10-24 LAB — VITAMIN D 25 HYDROXY
Vit D, 25-Hydroxy: 30 ng/mL (ref 20–100)
Vitamin D2, 1,25 (~~LOC~~)2: <4 ng/mL
Vitamin D3, 1,25 (~~LOC~~)2: 30 ng/mL

## 2007-10-24 LAB — COMPREHENSIVE METABOLIC PANEL
A/G Ratio: 1.5 (ref 1.0–2.1)
ALT: 43 units/L (ref 6–40)
AST: 41 units/L (ref 10–35)
Albumin: 4.4 g/dL (ref 3.6–5.1)
Alkaline Phosphatase: 68 units/L (ref 33–115)
BUN: 15 mg/dL (ref 7–25)
CO2: 25 mmol/L (ref 21–33)
Calcium: 9.6 mg/dL (ref 8.6–10.2)
Chloride: 101 mmol/L (ref 98–110)
Creatinine: 0.7 mg/dL (ref 0.59–1.07)
GFR MDRD Af Amer: >60 mL/min (ref >=60)
GFR MDRD Non Af Amer: >60 mL/min (ref >=60)
Globulin, Total: 2.9 g/dL (ref 2.2–3.9)
Glucose: 149 mg/dL (ref 65–99)
Potassium: 4.3 mmol/L (ref 3.5–5.3)
Sodium: 138 mmol/L (ref 135–146)
Total Bilirubin: 0.7 mg/dL (ref 0.2–1.2)
Total Protein: 7.3 g/dL (ref 6.2–8.3)

## 2007-10-24 LAB — LIPID PANEL
Chol/HDL Ratio: 5.3 (ref <=5.0)
Cholesterol, Total: 229 mg/dL (ref 125–200)
HDL: 43 mg/dL (ref >=46)
LDL Cholesterol: 114 mg/dL (ref <130)
Triglycerides: 362 mg/dL (ref <150)

## 2007-10-24 LAB — VITAMIN B12: Vitamin B-12: 439 pg/mL (ref 200–1100)

## 2007-10-24 LAB — TSH: TSH: 2.99 microintl units/mL

## 2007-10-24 NOTE — Unmapped (Signed)
Signed by Irena Cords MD on 10/26/2007 at 06:37:15  Patient: Wanda Rowe  Note: All result statuses are Final unless otherwise noted.    Tests: (1) LIPID PANEL (QDL-7600)    CHOLESTEROL, TOTAL   [H]  229 mg/dL                   098-119    HDL CHOLESTEROL      [L]  43 mg/dL                    > OR = 46    TRIGLYCERIDES        [H]  362 mg/dL                   <147   LDL-CHOLESTEROL (calc)                              114 mg/dL                   <829             DESIRABLE RANGE <100 MG/DL FOR PATIENTS WITH CHD OR      DIABETES AND <70 MG/DL FOR DIABETIC PATIENTS WITH      KNOWN HEART DISEASE.          CHOL/HDLC RATIO (calc)                         [H]  5.3                         < OR = 5.0    Note: An exclamation mark (!) indicates a result that was not dispersed into   the flowsheet.  Document Creation Date: 10/25/2007 3:24 AM  _______________________________________________________________________    (1) Order result status: Final  Collection or observation date-time: 10/24/2007 07:55  Requested date-time:   Receipt date-time: 10/24/2007 07:57  Reported date-time: 10/25/2007 03:00  Referring Physician:    Ordering Physician: Iyana Topor (HOVERMAK)  Specimen Source: S  Source: [name] Order Number: [id number]  Lab site: [facility] [place]      [address]      [place]  [place]  [address]

## 2007-10-24 NOTE — Unmapped (Signed)
Signed by Irena Cords MD on 10/26/2007 at 06:47:45  Patient: Wanda Rowe  Note: All result statuses are Final unless otherwise noted.    Tests: (1) VITAMIN B12 (QDL-927)    VITAMIN B12               439 pg/mL                   [phone]            REPORT COMMENT:      FASTING    Note: An exclamation mark (!) indicates a result that was not dispersed into   the flowsheet.  Document Creation Date: 10/25/2007 9:57 AM  _______________________________________________________________________    (1) Order result status: Final  Collection or observation date-time: 10/24/2007 07:55  Requested date-time:   Receipt date-time: 10/24/2007 07:57  Reported date-time: 10/25/2007 09:00  Referring Physician:    Ordering Physician: Mischele Detter (HOVERMAK)  Specimen Source: S  Source: [name] Order Number: [id number]  Lab site: [facility] [place]      [address]      [place]  [place]  [address]

## 2007-10-24 NOTE — Unmapped (Signed)
Signed by Emiliano Dyer MA on 10/24/2007 at 10:09:08    Prescriptions:  NEURONTIN 300 MG CAPS (GABAPENTIN) one by mouth twice a day  #180 x 2   Entered by: Emiliano Dyer MA   Authorized by: Irena Cords MD   Signed by: Maryanna Shape Brockert MA on 10/24/2007   Method used: Telephoned to ...     [facility] [place] [facility]     [address]     [place], [place]  [address]     Ph: [phone]     Fax: [phone]   RxID: [id number]  BENICAR HCT 20-12.5 MG TABS (OLMESARTAN MEDOXOMIL-HCTZ) one by mouth daily  #30 x 3   Entered by: Emiliano Dyer MA   Authorized by: Irena Cords MD   Signed by: Maryanna Shape Brockert MA on 10/24/2007   Method used: Telephoned to ...     [facility] [place] [facility]     [address]     [place], [place]  [address]     Ph: [phone]     Fax: [phone]   RxID: [id number]

## 2007-10-24 NOTE — Unmapped (Signed)
Signed by Irena Cords MD on 10/27/2007 at 06:49:21  Patient: [place] Lasseter Gradilla  Note: All result statuses are Final unless otherwise noted.    Tests: (1) VITAMIN D, 25-HYDROXY, LC/MS/MS (ZOX-09604)   VITAMIN D, 25-[place], TOTAL                              30 ng/mL                    20-100             25-OHD3 INDICATES BOTH ENDOGENOUS PRODUCTION AND      SUPPLEMENTATION. 25-OHD2 IS AN INDICATOR OF EXOGENOUS      SOURCES SUCH AS DIET OR SUPPLEMENTATION. THERAPY IS      BASED ON MEASUREMENT OF TOTAL 25-OHD, WITH LEVELS      <20 ng/mL INDICATIVE OF VITAMIN D DEFICIENCY WHILE      LEVELS BETWEEN 20 ng/mL AND 30 ng/mL SUGGEST      INSUFFICIENCY. OPTIMAL LEVELS ARE >30 ng/mL.           VITAMIN D, 25-[place], D3      30 ng/mL      REFERENCE RANGE      NOT ESTABLISHED      VITAMIN D, 25-[place], D2      <4 ng/mL      REFERENCE RANGE      NOT ESTABLISHED              REPORT COMMENT:      FASTING    Note: An exclamation mark (!) indicates a result that was not dispersed into   the flowsheet.  Document Creation Date: 10/26/2007 7:38 PM  _______________________________________________________________________    (1) Order result status: Final  Collection or observation date-time: 10/24/2007 07:55  Requested date-time:   Receipt date-time: 10/24/2007 07:57  Reported date-time: 10/26/2007 19:00  Referring Physician:    Ordering Physician: Cason Dabney (HOVERMAK)  Specimen Source: S  Source: [name] Order Number: [id number]  Lab site: [facility] [place]      [address]      [place]  [place]  [address]      -----------------    The following non-numeric lab results were dispersed to  the flowsheet even though numeric results were expected:      VITAMIN D, 25-[place], D2, <4

## 2007-10-24 NOTE — Unmapped (Signed)
Signed by Irena Cords MD on 10/26/2007 at 06:37:15  Patient: Wanda Rowe  Note: All result statuses are Final unless otherwise noted.    Tests: (1) COMPREHENSIVE METABOLIC PANEL W/EGFR (QDL-10231)    GLUCOSE              [H]  149 mg/dL                   45-40                  FASTING REFERENCE INTERVAL    UREA NITROGEN (BUN)       15 mg/dL                    9-81    CREATININE                0.7 mg/dL                   0.59-1.07   eGFR NON-AFR. AMERICAN                              >60 mL/min/1.17m2           > OR = 60   eGFR AFRICAN AMERICAN                              >60 mL/min/1.89m2           > OR = 60   BUN/CREATININE RATIO (calc)                              NOT APPLICABLE              6-22      BUN/CREATININE RATIO IS NOT REPORTED WHEN THE BUN      AND CREATININE VALUES ARE WITHIN NORMAL LIMITS.    SODIUM                    138 mmol/L                  135-146    POTASSIUM                 4.3 mmol/L                  3.5-5.3    CHLORIDE                  101 mmol/L                  98-110    CARBON DIOXIDE            25 mmol/L                   21-33    CALCIUM                   9.6 mg/dL                   1.9-14.7    PROTEIN, TOTAL            7.3 g/dL                    8.2-9.5    ALBUMIN  4.4 g/dL                    5.6-2.1    GLOBULIN (calc)           2.9 g/dL                    3.0-8.6   ALBUMIN/GLOBULIN RATIO (calc)                              1.5                         1.0-2.1    BILIRUBIN, TOTAL          0.7 mg/dL                   5.7-8.4    ALKALINE PHOSPHATASE      68 U/L                      33-115    AST                  [H]  41 U/L                      10-35    ALT                  [H]  43 U/L                      6-40            REPORT COMMENT:      FASTING    Note: An exclamation mark (!) indicates a result that was not dispersed into   the flowsheet.  Document Creation Date: 10/25/2007 3:24  AM  _______________________________________________________________________    (1) Order result status: Final  Collection or observation date-time: 10/24/2007 07:55  Requested date-time:   Receipt date-time: 10/24/2007 07:57  Reported date-time: 10/25/2007 03:00  Referring Physician:    Ordering Physician: Karlee Staff (HOVERMAK)  Specimen Source: S  Source: [name] Order Number: [id number]  Lab site: [facility] [place]      [address]      [place]  [facility]  [address]      -----------------    The following non-numeric lab results were dispersed to  the flowsheet even though numeric results were expected:      BUN/CREATININE RATIO (calc), NOT APPLICABLE

## 2007-10-24 NOTE — Unmapped (Signed)
Signed by Irena Cords MD on 10/26/2007 at 06:47:45  Patient: Mattye Verdone Robarge  Note: All result statuses are Final unless otherwise noted.    Tests: (1) TSH, 3RD GENERATION (QDL-899)    TSH, 3RD GENERATION       2.99 mIU/L      REFERENCE RANGE:              > OR = 20 YEARS: 0.40-4.50                   PREGNANCY RANGES       FIRST TRIMESTER    0.20-4.70       SECOND TRIMESTER   0.30-4.10       THIRD TRIMESTER    0.40-2.70                   REPORT COMMENT:      FASTING    Note: An exclamation mark (!) indicates a result that was not dispersed into   the flowsheet.  Document Creation Date: 10/25/2007 4:08 AM  _______________________________________________________________________    (1) Order result status: Final  Collection or observation date-time: 10/24/2007 07:55  Requested date-time:   Receipt date-time: 10/24/2007 07:57  Reported date-time: 10/25/2007 03:00  Referring Physician:    Ordering Physician: Orest Dygert (HOVERMAK)  Specimen Source: S  Source: [name] Order Number: [id number]  Lab site: [facility] [place]      [address]      [place]  [place]  [address]

## 2007-10-27 NOTE — Unmapped (Signed)
Signed by Emiliano Dyer MA on 10/27/2007 at 14:37:57    PHONE NOTE - Patient Call    Caller's Cell Phone #: [phone]   Caller: patient  Department: IM - General  Call for: DR [facility]     Reason for Call: can you fit pt in for a pre-op pt is having surgery on 11-10-2007 at that visit can you also see her for follow up on blood work please call pt.       Initial call taken by: Avanell Shackleton,  October 27, 2007 8:59 AM    Current Allergies:   ! SULFA    FOLLOW UP  please work in  Follow-up by: Irena Cords MD,  October 27, 2007 10:11 AM    ADDITIONAL FOLLOW UP  APPT MADE    Follow-up by: Emiliano Dyer MA,  October 27, 2007 2:37 PM

## 2007-11-04 NOTE — Unmapped (Signed)
Signed by Irena Cords MD on 11/07/2007 at 06:55:06      Reason for Visit   Chief Complaint: pre op and med check    History from: patient    Allergies  ! SULFA  Allergy and adverse reaction list reviewed during this update.      Medications   SINGULAIR 10 MG TAB (MONTELUKAST SODIUM) one by mouth at bedtime  NEURONTIN 300 MG CAPS (GABAPENTIN) one by mouth twice a day  FLONASE 50 MCG/ACT SUSPN (FLUTICASONE PROPIONATE (NASAL)) two sprays each nostril at bedtime  ATROVENT 0.03 % SOLN (IPRATROPIUM BROMIDE) 2 sprays bid  CO Q 10 60 MG CAPS (COENZYME Q10)   VALTREX 500 MG TABS (VALACYCLOVIR HCL) one by mouth dailey  * CALCIUM CITRATE 1 qd  * FLAX SEED OIL 1000 2 qd  * VIT C 1000 qd  CO Q10 100 MG TABS (COENZYME Q10) 1 daily  BENICAR HCT 20-12.5 MG TABS (OLMESARTAN MEDOXOMIL-HCTZ) one by mouth daily  GLUCOPHAGE XR 500 MG TB24 (METFORMIN HCL) 1 qd  LEXAPRO 10 MG TABS (ESCITALOPRAM OXALATE) one by mouth daily  ALBUTEROL AERS (ALBUTEROL AERS) two puffs four times a day as needed  SYNTHROID 50 MCG TABS (LEVOTHYROXINE SODIUM) one by mouth daily  NYSTATIN 100000 UNIT/GM CREA (NYSTATIN) Apply to affected area three times a day        Vital Signs:   Wt: 273 lbs.      BMI: 40.46  BSA: 2.36  Wt chg (lbs): -7  Temperature: 98.3  degrees F  oral  Pulse: 70 (regular)  Resp: 18    Patient appears to be in acute distress: no  BP: 128/80  Cuff size: large    Intake recorded by: Emiliano Dyer MA on November 04, 2007 1:02 PM  I have been asked to perform a pre-operative/pre-procedure consult of this patient by: Dr. Sandre Kitty  Procedure: R Ostectomy 2nd  Date of procedure: 11/10/2007        HPI Multiple Chronic   Additional Dx: Dislocation of second toe right foot, going for ostectomy second MTP joint.    ROS/Symptoms:   Patient denies any cardiovascular symptoms.  Patient denies any respiratory symptoms.  Patient denies any GI symptoms.  Patient denies any neurologic symptoms.  Patient denies any musculoskeletal symptoms.  Patient denies  any psychological symptoms.  Patient denies any general symptoms.  Patient complains of the following ENT symptoms: sinus pressure x 3 days, no fever    Past History  Past Medical History:  Hyperlipidemia, Hypertension, Diabetes Type II, Hypothyroidism, GERD, Stomach polyps, RSD right knee, Anxiety, Mild Asthma, .    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  Skin: No suspicious rashes or lesions.  Oropharynx: erythema  Respiratory: Respiration un-labored.  Lung fields clear to auscultation.  No wheezing, rales, rhonchi or pleural rub.  Neck: No thyromegaly.  No nodules, masses or tenderness.  Cardiac: S1 and S2 normal.  RRR without murmurs, rubs, gallops.  No JVD.  Vascular: No carotid bruits.  No edema or varicosities.  Diabetic Foot Check:   Skin integrity intact.  No splitting, ulcers or calluses.  Abdomen: No masses or tenderness. Bowel sounds active x4 quad.  Liver and spleen are without tenderness or enlargement.  No hernias.  Psychiatric: Judgement and insight are within normal limits.  Alert and oriented x3.  No mood disorders noted, appropriate affect.           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)  DIABETES MELLITUS, TYPE II  (ICD-250.00)  New Medications:  NEURONTIN 300 MG CAPS (GABAPENTIN) one by mouth tid  SYNTHROID 75 MCG TABS (LEVOTHYROXINE SODIUM) one by mouth daily  * LOVASA one by mouth twice a day  VITAMIN D 56213 UNIT CAPS (ERGOCALCIFEROL) 1 by mouth every two weeks x 2 mos then 1 every month  STRESS 500 B-COMPLEX  TABS (B COMPLEX-C-FOLIC ACID)   VALTREX 500 MG TABS (VALACYCLOVIR HCL) one by mouth qd  ZITHROMAX 250 MG TABS (AZITHROMYCIN) two by mouth now, then one by mouth daily for four additional days      Preventive Maintenance       Coordinating Care Providers   PCP Name: Dr. Gari Crown             Prescriptions:  LEXAPRO 10 MG TABS (ESCITALOPRAM OXALATE) one by mouth daily  #30 x 5   Entered and Authorized by: Chirag Krueger Johnn Hai MD   Signed by: Irena Cords MD on 11/04/2007   Method used: Print then Give to Patient   RxID: [id number]  GLUCOPHAGE XR 500 MG TB24 (METFORMIN HCL) 1 qd  #30 x 5   Entered and Authorized by: Stepehn Eckard Johnn Hai MD   Signed by: Irena Cords MD on 11/04/2007   Method used: Print then Give to Patient   RxID: [id number]  BENICAR HCT 20-12.5 MG TABS (OLMESARTAN MEDOXOMIL-HCTZ) one by mouth daily  #30 x 5   Entered and Authorized by: Rolonda Pontarelli Johnn Hai MD   Signed by: Irena Cords MD on 11/04/2007   Method used: Print then Give to Patient   RxID: [id number]  ATROVENT 0.03 % SOLN (IPRATROPIUM BROMIDE) 2 sprays bid  #1 x 5   Entered and Authorized by: Jimmy Plessinger Johnn Hai MD   Signed by: Irena Cords MD on 11/04/2007   Method used: Print then Give to Patient   RxID: [id number]  ZITHROMAX 250 MG TABS (AZITHROMYCIN) two by mouth now, then one by mouth daily for four additional days  #6 x 0   Entered and Authorized by: Irena Cords MD   Signed by: Emiliano Dyer MA on 11/04/2007   Method used: Print then Give to Patient   RxID: [id number]  FLONASE 50 MCG/ACT SUSPN (FLUTICASONE PROPIONATE (NASAL)) two sprays each nostril at bedtime  #1 x 3   Entered and Authorized by: Irena Cords MD   Signed by: Emiliano Dyer MA on 11/04/2007   Method used: Print then Give to Patient   RxID: [id number]  SINGULAIR 10 MG TAB (MONTELUKAST SODIUM) one by mouth at bedtime  #30 x 6   Entered and Authorized by: Tenzin Pavon Johnn Hai MD   Signed by:  Maryanna Shape Brockert MA on 11/04/2007   Method used: Print then Give to Patient   RxID: [id number]  NEURONTIN 300 MG CAPS (GABAPENTIN) one by mouth tid  #90 x 6   Entered and Authorized by: Irena Cords MD   Signed by: Emiliano Dyer MA on 11/04/2007   Method used: Print then Give to Patient   RxID: [id number]  VITAMIN D 84696 UNIT CAPS (ERGOCALCIFEROL) 1 by mouth every two weeks x 2 mos then 1 every month  #4 x 3   Entered and Authorized by: Irena Cords MD   Signed by: Emiliano Dyer MA on 11/04/2007   Method used: Print then Give to Patient   RxID: [id number]  SYNTHROID 75 MCG TABS (LEVOTHYROXINE SODIUM) one by mouth daily  #30 x 3   Entered and Authorized by: Irena Cords MD   Signed by: Emiliano Dyer MA on 11/04/2007   Method used: Print then Give to Patient   RxID: [id number]  LOVASA one by mouth twice a day  #60 x 3   Entered and Authorized by: Irena Cords MD   Signed by: Emiliano Dyer MA on 11/04/2007   Method used: Print then Give to Patient   RxID: [id number]      Assessment and Plan     Problems   Status of Existing Problems:  Assessed DIABETES MELLITUS, TYPE II as improved - continue current medications - Claudean Leavelle K Gianny Sabino MD - Signed  Assessed HYPERTRIGLYCERIDEMIA as deteriorated - lovasa - Estrellita Lasky K Raidyn Breiner MD - Signed  Assessed HYPERCHOLESTEROLEMIA as improved - continue current medications - Merica Prell K Lavar Rosenzweig MD - Signed  Assessed ANXIETY as improved - continue current medications - Berdia Lachman K Tabbatha Bordelon MD - Signed  Assessed HYPERTENSION as improved - continue current medications - Alonah Lineback K Hensley Aziz MD - Signed  Assessed UNSPECIFIED REFLEX SYMPATHETIC DYSTROPHY as improved - continue current  medications - Samule Life K Mahesh Sizemore MD - Signed  Assessed TRANSAMINASES, SERUM, ELEVATED as new - probable fatty liver treat tg and repeat - Serine Kea K Harlee Pursifull MD - Signed  Assessed CLOSED DISLOCATION OF INTERPHALANGEAL FOOT as new - surgical repair - Fusaye Wachtel K Javaun Dimperio MD - Signed  Assessed SINUSITIS, ACUTE as new - zpack - Mykael Trott K Mikie Misner MD - Signed  New Problems:  Dx of SINUSITIS, ACUTE (ICD-461.9)  Onset: 11/04/2007  Dx of CLOSED DISLOCATION OF INTERPHALANGEAL FOOT (ICD-838.06)  Onset: 11/04/2007  Dx of TRANSAMINASES, SERUM, ELEVATED (ICD-790.4)  Onset: 11/04/2007  Dx of 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)  Onset: 02/09/2007  Dx of DIABETES MELLITUS, TYPE II (ICD-250.00)  Onset: 11/04/2007    Medications   New Prescriptions/Refills:  LEXAPRO 10 MG TABS (ESCITALOPRAM OXALATE) one by mouth daily  #30 x 5, 11/04/2007, Mikhail Hallenbeck K Yamari Ventola MD  GLUCOPHAGE XR 500 MG TB24 (METFORMIN HCL) 1 qd  #30 x 5, 11/04/2007, Gilles Trimpe K Neasia Fleeman MD  BENICAR HCT 20-12.5 MG TABS (OLMESARTAN MEDOXOMIL-HCTZ) one by mouth daily  #30 x 5, 11/04/2007, Ziyonna Christner K Matan Steen MD  ATROVENT 0.03 % SOLN (IPRATROPIUM BROMIDE) 2 sprays bid  #1 x 5, 11/04/2007, Ramsay Bognar K Jeroline Wolbert MD  ZITHROMAX 250 MG TABS (AZITHROMYCIN) two by mouth now, then one by mouth daily for four additional days  #6 x 0, 11/04/2007, Amanda M Brockert MA  FLONASE 50 MCG/ACT SUSPN (FLUTICASONE PROPIONATE (NASAL)) two sprays each nostril at bedtime  #1 x 3, 11/04/2007, Amanda M Brockert MA  SINGULAIR 10 MG TAB (MONTELUKAST SODIUM) one by mouth at bedtime  #30  x 6, 11/04/2007, Maryanna Shape Brockert MA  NEURONTIN 300 MG CAPS (GABAPENTIN) one by mouth tid  #90 x 6, 11/04/2007, Maryanna Shape Brockert MA  VITAMIN D 50000 UNIT CAPS (ERGOCALCIFEROL) 1 by mouth every two weeks x 2 mos then 1 every month  #4 x 3, 11/04/2007, Maryanna Shape Brockert MA  SYNTHROID 75 MCG TABS (LEVOTHYROXINE SODIUM) one by mouth daily  #30 x 3, 11/04/2007, Maryanna Shape Brockert MA  LOVASA one by mouth twice a day  #60 x 3,  11/04/2007, Maryanna Shape Brockert MA    Today's Orders   CMP   (METAPNL) (10231) [CPT-80053]  Lipid Profile   (FATS) (7600) [CPT-80061]  TSH   (TSH) (899) [CPT-84443]  Hemoglobin  A1C   (GLYCO) (496)  [CPT-83036]  Vitamin D, 25-Hydrodroxy  (VITMD25) (17306) [CPT-82306]  99244 - Ofc Consult, Level IV [CPT-99244]    Pre OP Assessment:   Benefits of procedure outweigh risks; agree with planned procedure/anesthesia.

## 2008-01-28 NOTE — Unmapped (Signed)
Signed by Irena Cords MD on 01/28/2008 at 00:00:00  [facility]      Imported By: Tomasita Morrow CMA 02/07/2008 10:36:12    _____________________________________________________________________    External Attachment:    Please see [employer] EMR for this document.

## 2008-01-28 NOTE — Unmapped (Signed)
Signed by Irena Cords MD on 01/28/2008 at 00:00:00  Urine culture results      Imported By: Fanny Dance MA 01/31/2008 13:29:07    _____________________________________________________________________    External Attachment:    Please see [employer] EMR for this document.

## 2008-01-28 NOTE — Unmapped (Signed)
Signed by Irena Cords MD on 01/28/2008 at 00:00:00  Emergency Report [facility]      Imported By: Dahlia Byes 01/31/2008 10:05:51    _____________________________________________________________________    External Attachment:    Please see [employer] EMR for this document.

## 2008-01-30 NOTE — Unmapped (Signed)
Signed by Toniann Ket MD on 02/01/2008 at 08:58:41  Patient: Wanda Rowe  Note: All result statuses are Final unless otherwise noted.    Tests: (1) CULTURE, URINE, ROUTINE (QDL-395)  ! CULTURE                   .              CULTURE, URINE, ROUTINE                 MICRO NUMBER:      [id number]        TEST STATUS:       FINAL        SPECIMEN SOURCE:   URINE        SPECIMEN COMMENTS: ADEQUATE        RESULT:            NO GROWTH    Note: An exclamation mark (!) indicates a result that was not dispersed into   the flowsheet.  Document Creation Date: 01/31/2008 6:25 PM  _______________________________________________________________________    (1) Order result status: Final  Collection or observation date-time: 01/30/2008  Requested date-time:   Receipt date-time: 01/31/2008 03:39  Reported date-time: 01/31/2008 18:00  Referring Physician:    Ordering Physician: Morene Rankins [facility])  Specimen Source: R  Source: [name] Order Number: [id number]  Lab site: [facility] [place]      [address]      [place]  [place]  [address]

## 2008-01-30 NOTE — Unmapped (Signed)
Signed by Toniann Ket MD on 01/30/2008 at 16:16:34      Reason for Visit   Chief Complaint: fever, dizzy, uti, bleeding started today thinks she might be menstrating      Allergies  ! SULFA    Medications   SINGULAIR 10 MG TAB (MONTELUKAST SODIUM) one by mouth at bedtime  NEURONTIN 300 MG CAPS (GABAPENTIN) one by mouth tid  FLONASE 50 MCG/ACT SUSPN (FLUTICASONE PROPIONATE (NASAL)) two sprays each nostril at bedtime  ATROVENT 0.03 % SOLN (IPRATROPIUM BROMIDE) 2 sprays bid  VALTREX 500 MG TABS (VALACYCLOVIR HCL) one by mouth dailey  * CALCIUM CITRATE 1 qd  BENICAR HCT 20-12.5 MG TABS (OLMESARTAN MEDOXOMIL-HCTZ) one by mouth daily  GLUCOPHAGE XR 500 MG TB24 (METFORMIN HCL) 1 qd  LEXAPRO 10 MG TABS (ESCITALOPRAM OXALATE) one by mouth daily  SYNTHROID 75 MCG TABS (LEVOTHYROXINE SODIUM) one by mouth daily  * LOVASA one by mouth twice a day  VITAMIN D 03474 UNIT CAPS (ERGOCALCIFEROL) 1 by mouth every two weeks x 2 mos then 1 every month  VALTREX 500 MG TABS (VALACYCLOVIR HCL) one by mouth qd        Vital Signs:   Wt: 278 lbs.      BMI: 41.20  BSA: 2.38  Wt chg (lbs): 5  Temperature: 99.4  degrees F  oral  Pulse: 80  Resp: 16  LMP: 02/2007  BP: 130/82  Cuff size: regular    Intake recorded by: Nonie Hoyer MA on January 30, 2008 3:51 PM        Review of Systems  Genitourinary: some vaginal bleeding and fever and urinary symptoms told sat she had uti and given cipro 250mg ; still have fever and some vag bleeding tgoday and has not had period for some time      Physical Examination:   BP: 130/  82    Physical Exam- Detail:   Respiratory: Respiration un-labored.  Lung fields clear to auscultation.  No wheezing, rales, rhonchi or pleural rub.  Cardiac: S1 and S2 normal.  RRR without murmurs, rubs, gallops.  No JVD.           New Problems:  HEMATURIA (ICD-599.70)      Preventive Maintenance       Coordinating Care Providers   PCP Name: Dr. Gari Crown               Assessment and Plan     Problems   Status of Existing  Problems:  Assessed HEMATURIA as comment only - no culture result yet; will try to get resluts; not clear if this is uti due to some lack of symptoms but pain is over pelvic area and repeat culture also taken; 2x on cipro over night - Toniann Ket MD  New Problems:  Dx of HEMATURIA (ICD-599.70)  Onset: 01/30/2008  Today's Orders   99213 - Ofc Vst, Est Level III [QVZ-56387]  UA Dipstick (Office) [CPT-81002]  Urine Culture   (UC) (395) [CPT-87088]

## 2008-01-30 NOTE — Unmapped (Signed)
Signed by Irena Cords MD on 01/30/2008 at 00:00:00  Emergency Report      Imported By: Fanny Dance MA 01/31/2008 11:50:33    _____________________________________________________________________    External Attachment:    Please see [employer] EMR for this document.

## 2008-01-30 NOTE — Unmapped (Signed)
Signed by Irena Cords MD on 01/30/2008 at 00:00:00  Emergency Report      Imported By: Fanny Dance MA 01/31/2008 11:50:50    _____________________________________________________________________    External Attachment:    Please see [employer] EMR for this document.

## 2008-01-30 NOTE — Unmapped (Signed)
Signed by Rochele Raring on 01/30/2008 at 10:13:24    PHONE NOTE - Patient Call    Call back at Home Phone: [phone]  Caller: patient  Department: IM - General  Call for: Shakirah Kirkey    Reason for Call: patient was in er for uti this weekend  and is not feeling better  she has been on antibiotics, wanted  to see dr. h today  fever, not feeling well  can she be worked in???      Initial call taken by: Duanne Moron,  January 30, 2008 8:46 AM      FOLLOW UP  WILL SEE BAKER TODAY  Follow-up by: Duanne Moron,  January 30, 2008 10:11 AM

## 2008-01-30 NOTE — Unmapped (Signed)
Signed by Irena Cords MD on 01/30/2008 at 00:00:00  CT      Imported By: Fanny Dance MA 01/31/2008 11:51:07    _____________________________________________________________________    External Attachment:    Please see [employer] EMR for this document.

## 2008-01-31 NOTE — Unmapped (Signed)
Signed by Fanny Dance MA on 01/31/2008 at 14:52:55    PHONE NOTE - Patient Call    Call back at Home Phone: [phone]  Caller: patient  Department: IM - General  Call for: DR Excell Seltzer     Reason for Call: Did you get info from the hospial pt has to get different medcine from you today needs to speak to dr Kesley Mullens call her. pt is feeling a little better today with the double doze of cipro. but she is out of it now.     Pharmacy Information: [phone]       Initial call taken by: Avanell Shackleton,  January 31, 2008 11:46 AM    Current Allergies:   ! SULFA    New Medications:  Prescriptions:  CIPRO 500 MG TABS (CIPROFLOXACIN HCL) one by mouth twice a day  #14 x 0   Entered by: Fanny Dance MA   Authorized by: Toniann Ket MD   Signed by: Fanny Dance MA on 01/31/2008   Method used: Telephoned to ...     [facility]     [address]     [place], [place]  [address]     Ph: [phone]     Fax: [phone]   RxID: [id number]  CIPRO 500 MG TABS (CIPROFLOXACIN HCL) one by mouth twice a day    FOLLOW UP  cal [facility] for culture result is all I need  Follow-up by: Toniann Ket MD,  January 31, 2008 11:59 AM    ADDITIONAL FOLLOW UP  scanned into documents  Follow-up by: Fanny Dance MA,  January 31, 2008 1:29 PM    ADDITIONAL FOLLOW UP  culture is negative; if cipro help;ing inrease to 500 mg twice a day for 1 week and see gyn  Follow-up by: Toniann Ket MD,  January 31, 2008 1:33 PM    ADDITIONAL FOLLOW UP  spoke with patient and will follow up as directed  Follow-up by: Fanny Dance MA,  January 31, 2008 2:52 PM    Prescriptions:  CIPRO 500 MG TABS (CIPROFLOXACIN HCL) one by mouth twice a day  #14 x 0   Entered by: Fanny Dance MA   Authorized by: Toniann Ket MD   Signed by: Fanny Dance MA on 01/31/2008   Method used: Telephoned to ...     [facility] [place] [employer]     [address]     [place], [place]  [address]     Ph: [phone]     Fax:  [phone]   RxID: [id number]

## 2008-01-31 NOTE — Unmapped (Signed)
Signed by Toniann Ket MD on 01/31/2008 at 16:10:32    PHONE NOTE - Patient Call    Caller's Cell Phone #: [phone]  Caller: patient  Department: IM - General  Call for: baker    Reason for Call: patient was given results and needs to speak with dr.baker  call [phone]      Initial call taken by: Duanne Moron,  January 31, 2008 3:25 PM      FOLLOW UP  advised culture nega and she is seeing gyn in am better on higher dose of cipro  Follow-up by: Toniann Ket MD,  January 31, 2008 4:10 PM

## 2008-02-03 HISTORY — PX: ABDOMINAL HYSTERECTOMY: SHX81

## 2008-03-23 NOTE — Unmapped (Signed)
Signed by Roanna Raider MA on 03/29/2008 at 15:32:25    PHONE NOTE  Call for: Doran Stabler    Reason for Call: calling to speak to jen garrett to see if she needs to have another endoscopy but unsure of what type can be reached at [phone] advised will send a note to [facility] to clarify      Initial call taken by: Roanna Raider MA,  March 23, 2008 1:23 PM      FOLLOW UP  Needs repeat EGD/colo 12/11 per Dr. Mickie Bail procedure note in 12/08.  Follow-up by:  Angelina Pih MPAS, PA-C,  March 23, 2008 1:26 PM    FOLLOW UP  backline number left to call  Follow-up by:  Roanna Raider MA,  March 27, 2008 9:17 AM    FOLLOW UP  Follow-up by:  Roanna Raider MA,  March 28, 2008 10:03 AM    FOLLOW UP  patient called back and left VM needed to get this done in March.  Left backline number  to call and that we could take care of this in March.  Patient has numerous questions and confusion about previous time line and current timeline for procedures.  She also now has indigestion and would like to make appt for this as well.    phone call completed, appointment scheduled  Follow-up by:  Roanna Raider MA,  March 29, 2008 3:31 PM

## 2008-04-02 NOTE — Unmapped (Signed)
Signed by Angelina Pih MPAS, PA-C on 04/02/2008 at 00:00:00  GI Review of Systems      Imported By: Coletta Memos 04/11/2008 14:41:55    _____________________________________________________________________    External Attachment:    Please see [employer] EMR for this document.

## 2008-04-02 NOTE — Unmapped (Signed)
Signed by Angelina Pih MPAS, PA-C on 04/02/2008 at 20:52:24    ?? [facility]  Division of Digestive Diseases        HISTORY OF PRESENT ILLNESS  48 yo w/ h/o gastric polyps w/ initial EGD in [place] noted to be 'pre-cancerous', w/ repeat EGDs w/ neg biopsies.  Last polyp removal in 2006 w/ repeat EGD in 10/07 and 12/08 here in [place] both neg beyond trivial gastritis.    Recommendations in 12/08 were repeat EGD/colo in 3 years (FH colon CA).    Patient called office last week to schedule EGD and also w/ complaints of increased belching. Here to discuss further. Reports belching quite frequently, which is new symptom for her.  Does admit t rare reflux, but only when eats very large meal.    ADenies any other GI symptom including nausea, abd pain, dysphagia, heartburn, diarrhea, constipation. Does have 3-4 stools/day which has been long-standing issue.    Appetite good. Weight stable.    VITAL SIGNS    Height: 69 inches    Weight: 277 lbs/ 125.65 kg.    BMI (in-lb): 41.05,    Blood Pressure: 118 / 78mm Hg,    Pulse rate: 66,    Respirations: 14,    ALLERGIES    ! SULFA    MEDICATIONS (on Intake):    SINGULAIR 10 MG TAB (MONTELUKAST SODIUM) one by mouth at bedtime  NEURONTIN 300 MG CAPS (GABAPENTIN) one by mouth tid  FLONASE 50 MCG/ACT SUSPN (FLUTICASONE PROPIONATE (NASAL)) two sprays each nostril at bedtime  ATROVENT 0.03 % SOLN (IPRATROPIUM BROMIDE) 2 sprays bid  VALTREX 500 MG TABS (VALACYCLOVIR HCL) one by mouth dailey  * CALCIUM CITRATE 1 qd  BENICAR HCT 20-12.5 MG TABS (OLMESARTAN MEDOXOMIL-HCTZ) one by mouth daily  GLUCOPHAGE XR 500 MG TB24 (METFORMIN HCL) 1 qd  SYNTHROID 75 MCG TABS (LEVOTHYROXINE SODIUM) one by mouth daily  * LOVASA one by mouth twice a day  VITAMIN D 02725 UNIT CAPS (ERGOCALCIFEROL) 1 by mouth every two weeks x 2 mos then 1 every month  VALTREX 500 MG TABS (VALACYCLOVIR HCL) one by mouth qd      Intake recorded by:  Rosalio Loud Ridner MA  April 02, 2008 11:19  AM    Smoking Status: non-smoker   Medications reviewed, updated and verified with patient or patient representative.    Past History  Past Medical History (reviewed - no changes required):  Hyperlipidemia, Hypertension, Diabetes Type II, Hypothyroidism, GERD, Stomach polyps, RSD right knee, Anxiety, Mild Asthma, .    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  Cardiovascular: heart has regular rate and rhythm; no rub heard. No edema in extremities.   Abdomen: abdomen is soft, nontender and nondistended, with normal bowel sounds. No mass or organomegaly appreciated.  Lymphatics: no cervical or supraclavicular nodes appreciated  Rectal: deferred  MSK: normal & symmetric muscle mass and range of motion; no swollen joints; no limp  Neurologic: alert + oriented x3; no tremor or ataxia; moves 4 extremities  Psychiatric: appropriate affect; calm and reasonable              ASSESSMENT and PLAN   48 yo w/ h/o gastric polyps  w/ HGD (initial EGD) w/ repeat EGD's q3 mos in [place] all neg for dysplasia, and last two EGD's neg for polyps. Patient has noted recently increased belching and is concerned may correlate with gastric polyps - has also noted some increased reflux, though nly w/ large meals.  Discussed etiology of increased belching - most likely d/t poor gastric relaxation, though patient reports is not affecting QOL enough to consider medications. Is not interested in PPI therapy as feels this was cause for gastric polyps in past (none since d/c of PPI). Do not suspect correlation, though did d/w Dr. Mickie Bail and will schedule fr repeat EGD, as patient is certainly cncerned in light of her previous history and new onset of this particular symptom.  Encouraged to start probiotics as can sometimes be helpful for upper GI gas (and certainly will not have SE), and this certainly may be helpful for her stool frequency which she has noted for many years.  RTC as needed and will be due for colo at end of 2011.    Problems  BELCHING (ICD-787.3)  GASTRIC POLYP, HX OF (ICD-V12.79)    Medications Stopped or Removed  at this visit:  LEXAPRO 10 MG TABS (ESCITALOPRAM OXALATE) one by mouth daily                    CC:   Dr. Gari Crown

## 2008-04-06 NOTE — Unmapped (Signed)
Signed by Roanna Raider MA on 04/06/2008 at 11:16:35        [facility]   [address]  [place], [place] [address]   [phone]  Fax: [phone]             April 02, 2008          RE: Wanda Rowe   DOB:  [birth year]      Dear Dr. Gari Crown,    I recently saw Wanda Rowe in the office.  Please see my Assessment and Plan below. My complete office note is available at your request.  Should you have any further questions please do not hesitate to contact me.    ASSESSMENT AND PLAN  48 yo w/ h/o gastric polyps w/ HGD (initial EGD) w/ repeat EGD's q3 mos in [place] all neg for dysplasia, and last two EGD's neg for polyps. Patient has noted recently increased belching and is concerned may correlate with gastric polyps - has also noted some increased reflux, though nly w/ large meals.  Discussed etiology of increased belching - most likely d/t poor gastric relaxation, though patient reports is not affecting QOL enough to consider medications. Is not interested in PPI therapy as feels this was cause for gastric polyps in past (none since d/c of PPI). Do not suspect correlation, though did d/w Dr. Mickie Bail and will schedule fr repeat EGD, as patient is certainly cncerned in light of her previous history and new onset of this particular symptom.  Encouraged to start probiotics as can sometimes be helpful for upper GI gas (and certainly will not have SE), and this certainly may be helpful for her stool frequency which she has noted for many years.  RTC as needed and will be due for colo at end of 2011.      Thank you for allowing me to see this patient.  I will keep you informed of their progress.       Best personal regards,      Angelina Pih, MPAS, PA-C  Physician Assistant

## 2008-04-09 NOTE — Unmapped (Signed)
Signed by Roanna Raider MA on 04/09/2008 at 10:58:54    [facility]  Division of Digestive Diseases        SURGERY / PROCEDURE SCHEDULE SHEET     Requested Date: 04/16/2008    Arrival Time: 7:00 AM    Requested Time: 8:00 AM    Length of Surgery: 30 minutes      Physician: Lurene Shadow MD    Facility: [facility]    Type of patient: Internal Referral    Anesthesia Type: IV Sedation    Medications:   SINGULAIR 10 MG TAB (MONTELUKAST SODIUM) one by mouth at bedtime  NEURONTIN 300 MG CAPS (GABAPENTIN) one by mouth tid  FLONASE 50 MCG/ACT SUSPN (FLUTICASONE PROPIONATE (NASAL)) two sprays each nostril at bedtime  ATROVENT 0.03 % SOLN (IPRATROPIUM BROMIDE) 2 sprays bid  VALTREX 500 MG TABS (VALACYCLOVIR HCL) one by mouth dailey  * CALCIUM CITRATE 1 qd  BENICAR HCT 20-12.5 MG TABS (OLMESARTAN MEDOXOMIL-HCTZ) one by mouth daily  GLUCOPHAGE XR 500 MG TB24 (METFORMIN HCL) 1 qd  SYNTHROID 75 MCG TABS (LEVOTHYROXINE SODIUM) one by mouth daily  * LOVASA one by mouth twice a day  VITAMIN D 50000 UNIT CAPS (ERGOCALCIFEROL) 1 by mouth every two weeks x 2 mos then 1 every month  VALTREX 500 MG TABS (VALACYCLOVIR HCL) one by mouth qd    Allergies: ! SULFA    * Latex Sensitive: No    Procedure:     Procedure: EGD    Diagnoses: GASTRIC POLYP HX OF (ICD-V12.79), INSTRUCTIONS REVIEWED WITH THE PATIENT    Patient Information:     Name: Wanda Rowe    DOB: May 04, 1960    SSN: [id number]    Address: [id number]  [place], [place]  [address]    Gender: Female    Home phone: [phone]    Work phone: [phone]    IDX #: [id number]    Last Word #: [id number]    PCP: Dr. Gari Crown     Insurance Information:     Primary Insurance: ANTHE[employer]    Member ID #: [id number]

## 2008-04-16 ENCOUNTER — Inpatient Hospital Stay

## 2008-04-16 NOTE — Unmapped (Signed)
Signed by Lurene Shadow MD on 04/16/2008 at 00:00:00  Endooscopy Reports      Imported By: Coletta Memos 04/23/2008 11:50:09    _____________________________________________________________________    External Attachment:    Please see [employer] EMR for this document.

## 2008-04-16 NOTE — Unmapped (Signed)
Signed by   LinkLog[employer] on 04/16/2008 at 12:45:11  Patient: [name]  Note: All result statuses are Final unless otherwise noted.    Tests: (1)  (MR)    Order Note:                             [facility]     PATIENT NAMEANAHIS, Wanda Rowe                      MR #:  [id number]  DATE OF BIRTH:  1960/03/27                        ACCOUNT #:  [id number]  PHYSICIAN:      Lurene Shadow, M.D.          ROOM #:  SERVICE:        Internal Medicine/Digestive Disease NURSING UNIT:  DICTATED BY:    Lurene Shadow, M.D.          [facility]:  S  PROCEDURE DATE: 04/16/2008                        ADMIT DATE:  04/16/2008                                                    DISCHARGE DATE:                                       PROCEDURE        HISTORY:  A 48 year old woman with a prior history of gastric polyps who  recently has had difficulties with belching for several months as well as  some dyspepsia.  Upper endoscopy was planned.     ANESTHESIA:  Sedation was administered using mg of IV Benadryl, 4 mg of IV  midazolam and 50 mcg of IV fentanyl.     DETAILS OF PROCEDURE:  A gastroscope was passed from the mouth as far as the  second portion of the duodenum.  The tubular esophagus and Z-line her  entirely normal.  There is a small hiatal hernia.  The stomach looks normal  with the exception of some trivial gastritis in the antrum.  Biopsies were  taken in the antrum and body to rule out H. pylori.  There is very mild  duodenitis in the proximal duodenum.     IMPRESSION:     1. Essentially normal upper endoscopy with mild gastritis and duodenitis and    biopsies were taken to rule out H. pylori, we will follow up these biopsies    and manage the patient appropriately.                                                    _______________________________________  NS/dcd                                 _____  D:  04/16/2008 08:33                   Lurene Shadow, M.D.  T:  04/16/2008 12:29  Job #:  [id number]     c:    Wileen K. Hovermale, M.D.                                       PROCEDURE                                                               Page    1 of   1    Note: An exclamation mark (!) indicates a result that was not dispersed into   the flowsheet.  Document Creation Date: 04/16/2008 12:45 PM  _______________________________________________________________________    (1) Order result status: Final  Collection or observation date-time: 04/16/2008 00:00  Requested date-time:   Receipt date-time:   Reported date-time:   Referring Physician: Lurene Shadow  Ordering Physician:  [facility]ed [facility])  Specimen Source:   Source: DBS  Filler Order Number: [id number]  Lab site:

## 2008-04-16 NOTE — Unmapped (Signed)
[facility]     PATIENT NAMEFELEICA, Wanda Rowe                      MR #:  [id number]   DATE OF BIRTH:  September 29, 1960                        ACCOUNT #:  [id number]   PHYSICIAN:      Lurene Shadow, M.D.          ROOM #:   SERVICE:        Internal Medicine/Digestive Disease NURSING UNIT:   DICTATED BY:    Lurene Shadow, M.D.          [facility]:  S   PROCEDURE DATE: 04/16/2008                        ADMIT DATE:  04/16/2008                                                     DISCHARGE DATE:                                       PROCEDURE       HISTORY:  A 48 year old woman with a prior history of gastric polyps who   recently has had difficulties with belching for several months as well as   some dyspepsia.  Upper endoscopy was planned.     ANESTHESIA:  Sedation was administered using mg of IV Benadryl, 4 mg of IV   midazolam and 50 mcg of IV fentanyl.     DETAILS OF PROCEDURE:  A gastroscope was passed from the mouth as far as the   second portion of the duodenum.  The tubular esophagus and Z-line her   entirely normal.  There is a small hiatal hernia.  The stomach looks normal   with the exception of some trivial gastritis in the antrum.  Biopsies were   taken in the antrum and body to rule out H. pylori.  There is very mild   duodenitis in the proximal duodenum.     IMPRESSION:     1. Essentially normal upper endoscopy with mild gastritis and duodenitis and     biopsies were taken to rule out H. pylori, we will follow up these biopsies     and manage the patient appropriately.                                                 _______________________________________   NS/dcd                                 _____   D:  04/16/2008 08:33                   Lurene Shadow, M.D.   T:  04/16/2008 12:29   Job #:  [id number]     c:   Sanai K. Hovermale, M.D.  PROCEDURE                                                                Page    1 of   1   Job  #:  [id number]     c:   Jena K. Hovermale, M.D.                                       PROCEDURE                                                                Page    1 of   1

## 2008-04-16 NOTE — Unmapped (Signed)
Signed by Lurene Shadow MD on 04/16/2008 at 00:00:00  EGD Photo      Imported By: Coletta Memos 04/23/2008 13:26:42    _____________________________________________________________________    External Attachment:    Please see [employer] EMR for this document.

## 2008-04-16 NOTE — Unmapped (Signed)
Signed by Irena Cords MD on 04/16/2008 at 00:00:00  Surgery      Imported By: Vance Peper 04/17/2008 12:14:36    _____________________________________________________________________    External Attachment:    Please see [facility] EMR for this document.

## 2008-04-18 NOTE — Unmapped (Signed)
Signed by Angelina Pih MPAS, PA-C on 04/18/2008 at 16:20:20    PHONE NOTE    PHONE NOTE  Details for Reason: Called patient to review path. Left VM.  Call placed by: Angelina Pih MPAS, PA-C,  April 18, 2008 8:50 AM      FOLLOW UP  Reviewed path. Discussed again consideration of TCA or SSRI for gastric relaxation, but patient does not want to pursue.  Colo 2011 and EGD 2013. Will f/u in office as needed.  Follow-up by:  Angelina Pih MPAS, PA-C,  April 18, 2008 4:20 PM

## 2008-04-20 DIAGNOSIS — Z01818 Encounter for other preprocedural examination: Secondary | ICD-10-CM | POA: Insufficient documentation

## 2008-04-20 NOTE — Unmapped (Signed)
Signed by Nonie Hoyer MA on 04/23/2008 at 09:35:06    PHONE NOTE  Caller's Cell Phone #: [phone]   Caller: patient  Department: IM - General  Call for: [facility]     Reason for Call: patient insurance ends the end of this month. patient is having surgery on 04.06.2010 and patient cannot have blood work done before 03.27.2010. can you fit patiient in on 03.31.2010 for a pre-op and give her a lab order       Initial call taken by: Avanell Shackleton,  April 20, 2008 1:30 PM    Current Allergies:   ! SULFA    New Problems:  HEALTH MAINTENANCE EXAM (ICD-V70.0)    New Orders:  CMP   (METAPNL) (10231) [CPT-80053]  Lipid Profile   (FATS) (7600) [CPT-80061]  B-12  (B12)  (927) [*CPT-82607]  CBC without Diff   (CBC) (1759) [CPT-85027]  Ferritin Level   (FERRIT) (457)  [CPT-82728]  Folic Acid  (FOLATE) (466) [UJW-11914]  Iron & TIBC    (IS) (7573) [CPT-83540]  TSH   (TSH) (899) [CPT-84443]  Vitamin D, 25-Hydrodroxy  (VITMD25) (17306) [CPT-82306]  Hemoglobin  A1C   (GLYCO) (496)  [CPT-83036]  FOLLOW UP  ok  Follow-up by:  Irena Cords MD,  April 23, 2008 4:13 AM    FOLLOW UP  scheduling appt now w/ karen.   Follow-up by:  Nonie Hoyer MA,  April 23, 2008 9:35 AM

## 2008-04-28 LAB — IRON STUDIES
% Iron Saturation: 20 % (ref 15–50)
Iron: 74 ug/dL (ref 40–175)
TIBC: 365 ug/dL (ref 250–450)

## 2008-04-28 LAB — COMPREHENSIVE METABOLIC PANEL
A/G Ratio: 1.7 (ref 1.0–2.1)
ALT: 71 units/L (ref 6–40)
AST: 61 units/L (ref 10–35)
Albumin: 4.6 g/dL (ref 3.6–5.1)
Alkaline Phosphatase: 88 units/L (ref 33–115)
BUN: 16 mg/dL (ref 7–25)
CO2: 24 mmol/L (ref 21–33)
Calcium: 9.9 mg/dL (ref 8.6–10.2)
Chloride: 103 mmol/L (ref 98–110)
Creatinine: 0.7 mg/dL (ref 0.59–1.07)
GFR MDRD Af Amer: >60 mL/min (ref >=60)
GFR MDRD Non Af Amer: >60 mL/min (ref >=60)
Globulin, Total: 2.7 g/dL (ref 2.2–3.9)
Glucose: 162 mg/dL (ref 65–99)
Potassium: 4.5 mmol/L (ref 3.5–5.3)
Sodium: 139 mmol/L (ref 135–146)
Total Bilirubin: 0.5 mg/dL (ref 0.2–1.2)
Total Protein: 7.3 g/dL (ref 6.2–8.3)

## 2008-04-28 LAB — LIPID PANEL
Chol/HDL Ratio: 5 (ref <=5.0)
Cholesterol, Total: 210 mg/dL — ABNORMAL HIGH (ref 125–200)
HDL: 42 mg/dL — ABNORMAL LOW (ref >=46)
LDL Cholesterol: 124 mg/dL (ref <130)
Triglycerides: 220 mg/dL — ABNORMAL HIGH (ref <150)

## 2008-04-28 LAB — CBC
Hematocrit: 38.8 % (ref 35.0–45.0)
Hemoglobin: 13.6 g/dL (ref 11.7–15.5)
MCH: 31.9 pg (ref 27.0–33.0)
MCHC: 35.1 g/dL (ref 32.0–36.0)
MCV: 90.8 fL (ref 80.0–100.0)
Platelets: 216 10*3/mm3 (ref 140–400)
RBC: 4.28 10*6/mm3 (ref 3.80–5.10)
RDW: 13.3 % (ref 11.0–15.0)
WBC: 5.4 10*3/mm3 (ref 3.8–10.8)

## 2008-04-28 LAB — TSH: TSH: 0.99 microintl units/mL

## 2008-04-28 LAB — VITAMIN B12: Vitamin B-12: 370 pg/mL (ref 200–1100)

## 2008-04-28 LAB — VITAMIN D 25 HYDROXY
Vit D, 25-Hydroxy: 29 ng/mL (ref 20–100)
Vitamin D2, 1,25 (~~LOC~~)2: 12 ng/mL
Vitamin D3, 1,25 (~~LOC~~)2: 17 ng/mL

## 2008-04-28 LAB — FERRITIN: Ferritin: 254 ng/mL (ref 10–232)

## 2008-04-28 LAB — HEMOGLOBIN A1C: Hemoglobin A1C: 8.1 %

## 2008-04-28 LAB — FOLATE: Folic Acid: 13.8 ng/mL

## 2008-04-28 NOTE — Unmapped (Signed)
Signed by Vira Browns MD on 04/30/2008 at 09:50:32  Patient: Wanda Rowe  Note: All result statuses are Final unless otherwise noted.    Tests: (1) FERRITIN (QDL-457)    FERRITIN             [H]  254 ng/mL                   10-232    Note: An exclamation mark (!) indicates a result that was not dispersed into   the flowsheet.  Document Creation Date: 04/29/2008 2:42 AM  _______________________________________________________________________    (1) Order result status: Final  Collection or observation date-time: 04/28/2008 09:55  Requested date-time:   Receipt date-time: 04/28/2008 09:56  Reported date-time: 04/29/2008 02:00  Referring Physician:    Ordering Physician: Sten Dematteo (HOVERMAK)  Specimen Source: S  Source: [name] Order Number: [id number]  Lab site: [facility] [place]      [address]      [place]  [place]  [address]

## 2008-04-28 NOTE — Unmapped (Signed)
Signed by Vira Browns MD on 05/02/2008 at 09:25:38  Patient: Wanda Rowe  Note: All result statuses are Final unless otherwise noted.    Tests: (1) VITAMIN D, 25-HYDROXY, LC/MS/MS (IEP-32951)   VITAMIN D, 25-[place], TOTAL                              29 ng/mL                    20-100             25-OHD3 indicates both endogenous production and      supplementation. 25-OHD2 is an indicator of exogenous      sources, such as diet or supplementation. Therapy is      based on measurement of Total 25-OHD, with levels       <20 ng/mL indicative of Vitamin D deficiency, while       levels between 20 ng/mL and 30 ng/mL suggest      insufficiency. Optimal levels are >30 ng/mL.           VITAMIN D, 25-[place], D3      17 ng/mL      Reference Range      Not established    VITAMIN D, 25-[place], D2      12 ng/mL      Reference Range      Not established            REPORT COMMENT:      FASTING    Note: An exclamation mark (!) indicates a result that was not dispersed into   the flowsheet.  Document Creation Date: 05/01/2008 10:15 PM  _______________________________________________________________________    (1) Order result status: Final  Collection or observation date-time: 04/28/2008 09:55  Requested date-time:   Receipt date-time: 04/28/2008 09:56  Reported date-time: 05/01/2008 22:00  Referring Physician:    Ordering Physician: Ladavion Savitz (HOVERMAK)  Specimen Source: S  Source: [name] Order Number: [id number]  Lab site: [facility] [place]      [address]      [place]  [place]  [address]

## 2008-04-28 NOTE — Unmapped (Signed)
Signed by Vira Browns MD on 04/30/2008 at 09:50:32  Patient: Wanda Rowe  Note: All result statuses are Final unless otherwise noted.    Tests: (1) VITAMIN B12 (QDL-927)    VITAMIN B12               370 pg/mL                   [phone]             PLEASE NOTE: ALTHOUGH THE REFERENCE RANGE FOR VITAMIN      B12 IS [phone] PG/ML, IT HAS BEEN REPORTED THAT BETWEEN      5 AND 10% OF PATIENTS WITH VALUES BETWEEN 200 AND 400      PG/ML MAY EXPERIENCE NEUROPSYCHIATRIC AND HEMATOLOGIC      ABNORMALITIES DUE TO OCCULT B12 DEFICIENCY; LESS THAN 1%      OF PATIENTS WITH VALUES ABOVE 400 PG/ML WILL HAVE SYMPTOMS.            REPORT COMMENT:      FASTING    Note: An exclamation mark (!) indicates a result that was not dispersed into   the flowsheet.  Document Creation Date: 04/29/2008 10:08 AM  _______________________________________________________________________    (1) Order result status: Final  Collection or observation date-time: 04/28/2008 09:55  Requested date-time:   Receipt date-time: 04/28/2008 09:56  Reported date-time: 04/29/2008 09:00  Referring Physician:    Ordering Physician: Graceann Boileau (HOVERMAK)  Specimen Source: S  Source: [name] Order Number: [id number]  Lab site: [facility] [place]      [address]      [place]  [place]  [address]

## 2008-04-28 NOTE — Unmapped (Signed)
Signed by Vira Browns MD on 04/30/2008 at 09:50:32  Patient: Wanda Rowe  Note: All result statuses are Final unless otherwise noted.    Tests: (1) TSH, 3RD GENERATION (QDL-899)    TSH, 3RD GENERATION       0.99 mIU/L      Reference Range              > or = 20 Years  0.40-4.50                   Pregnancy Ranges       First trimester    0.20-4.70       Second trimester   0.30-4.10       Third trimester    0.40-2.70            REPORT COMMENT:      FASTING    Note: An exclamation mark (!) indicates a result that was not dispersed into   the flowsheet.  Document Creation Date: 04/29/2008 2:42 AM  _______________________________________________________________________    (1) Order result status: Final  Collection or observation date-time: 04/28/2008 09:55  Requested date-time:   Receipt date-time: 04/28/2008 09:56  Reported date-time: 04/29/2008 02:00  Referring Physician:    Ordering Physician: Whitnee Orzel (HOVERMAK)  Specimen Source: S  Source: [name] Order Number: [id number] [id number]  Lab site: [facility] [place]      [address]      [place]  [place]  [address]

## 2008-04-28 NOTE — Unmapped (Signed)
Signed by Vira Browns MD on 04/30/2008 at 09:49:32  Patient: Wanda Rowe  Note: All result statuses are Final unless otherwise noted.    Tests: (1) CBC (H/H, RBC, INDICES, WBC, PLT) (QDL-1759)   WHITE BLOOD CELL COUNT                              5.4 Thousand/uL             3.8-10.8    RED BLOOD CELL COUNT      4.28 Million/uL             3.80-5.10    HEMOGLOBIN                13.6 g/dL                   45.4-09.8    HEMATOCRIT                38.8 %                      35.0-45.0    MCV                       90.8 fL                     80.0-100.0    MCH                       31.9 pg                     27.0-33.0    MCHC                      35.1 g/dL                   11.9-14.7    RDW                       13.3 %                      11.0-15.0    PLATELET COUNT            216 Thousand/uL             140-400    Note: An exclamation mark (!) indicates a result that was not dispersed into   the flowsheet.  Document Creation Date: 04/28/2008 5:55 PM  _______________________________________________________________________    (1) Order result status: Final  Collection or observation date-time: 04/28/2008 09:48  Requested date-time:   Receipt date-time: 04/28/2008 09:50  Reported date-time: 04/28/2008 17:00  Referring Physician:    Ordering Physician: Sharnette Kitamura (HOVERMAK)  Specimen Source: B  Source: [name] Order Number: [id number]  Lab site: [facility] [place]      [address]      [place]  [place]  [address]      -----------------    The following lab values were dispersed to the flowsheet  with no units conversion:      WHITE BLOOD CELL COUNT, 5.4 THOUSAND/UL, (F)  expected units: 10*3/mm3    RED BLOOD CELL COUNT, 4.28 MILLION/UL, (F)  expected units: 10*6/mm3    PLATELET COUNT, 216 THOUSAND/UL, (F)  expected units: 10*3/mm3

## 2008-04-28 NOTE — Unmapped (Signed)
Signed by Vira Browns MD on 04/30/2008 at 09:50:32  Patient: Wanda Rowe  Note: All result statuses are Final unless otherwise noted.    Tests: (1) FOLATE, SERUM (QDL-466)    FOLATE, SERUM             13.8 ng/mL                                 Reference Range                                 Low:           <3.4                                 Borderline:    3.4-5.4                                 Normal:        >5.4           Note: An exclamation mark (!) indicates a result that was not dispersed into   the flowsheet.  Document Creation Date: 04/29/2008 10:08 AM  _______________________________________________________________________    (1) Order result status: Final  Collection or observation date-time: 04/28/2008 09:55  Requested date-time:   Receipt date-time: 04/28/2008 09:56  Reported date-time: 04/29/2008 09:00  Referring Physician:    Ordering Physician: Temari Schooler (HOVERMAK)  Specimen Source: S  Source: [name] Order Number: [id number]  Lab site: [facility] [place]      [address]      [place]  [place]  [address]

## 2008-04-28 NOTE — Unmapped (Signed)
Signed by Vira Browns MD on 04/30/2008 at 09:51:02  Patient: Wanda Rowe  Note: All result statuses are Final unless otherwise noted.    Tests: (1) COMPREHENSIVE METABOLIC PANEL W/EGFR (QDL-10231)    GLUCOSE              [H]  162 mg/dL                   16-10                        Fasting reference interval           UREA NITROGEN (BUN)       16 mg/dL                    9-60    CREATININE                0.7 mg/dL                   0.59-1.07   eGFR NON-AFR. AMERICAN                              >60 mL/min/1.10m2           > OR = 60   eGFR AFRICAN AMERICAN                              >60 mL/min/1.80m2           > OR = 60   BUN/CREATININE RATIO (calc)                              NOT APPLICABLE              6-22             Bun/Creatinine ratio is not reported when the BUN      and creatinine values are within normal limits.           SODIUM                    139 mmol/L                  135-146    POTASSIUM                 4.5 mmol/L                  3.5-5.3    CHLORIDE                  103 mmol/L                  98-110    CARBON DIOXIDE            24 mmol/L                   21-33    CALCIUM                   9.9 mg/dL                   4.5-40.9    PROTEIN, TOTAL            7.3 g/dL  6.2-8.3    ALBUMIN                   4.6 g/dL                    1.9-1.4    GLOBULIN (calc)           2.7 g/dL                    7.8-2.9   ALBUMIN/GLOBULIN RATIO (calc)                              1.7                         1.0-2.1    BILIRUBIN, TOTAL          0.5 mg/dL                   5.6-2.1    ALKALINE PHOSPHATASE      88 U/L                      33-115    AST                  [H]  61 U/L                      10-35    ALT                  [H]  71 U/L                      6-40            REPORT COMMENT:      FASTING    Note: An exclamation mark (!) indicates a result that was not dispersed into   the flowsheet.  Document Creation Date: 04/28/2008 11:09  PM  _______________________________________________________________________    (1) Order result status: Final  Collection or observation date-time: 04/28/2008 09:55  Requested date-time:   Receipt date-time: 04/28/2008 09:56  Reported date-time: 04/28/2008 22:00  Referring Physician:    Ordering Physician: Leiliana Foody (HOVERMAK)  Specimen Source: S  Source: [name] Order Number: [id number]  Lab site: [facility] [place]      [address]      [place]  [facility]  [address]      -----------------    The following non-numeric lab results were dispersed to  the flowsheet even though numeric results were expected:      BUN/CREATININE RATIO (calc), NOT APPLICABLE

## 2008-04-28 NOTE — Unmapped (Signed)
Signed by Vira Browns MD on 04/30/2008 at 09:50:02  Patient: Ione Sandusky Soderquist  Note: All result statuses are Final unless otherwise noted.    Tests: (1) LIPID PANEL (QDL-7600)    CHOLESTEROL, TOTAL   [H]  210 mg/dL                   161-096    HDL CHOLESTEROL      [L]  42 mg/dL                    > OR = 46    TRIGLYCERIDES        [H]  220 mg/dL                   <045   LDL-CHOLESTEROL (calc)                              124 mg/dL                   <409             Desirable range <100 mg/dL for patients with CHD or      diabetes and <70 mg/dL for diabetic patients with      known heart disease.          CHOL/HDLC RATIO (calc)                              5.0                         < OR = 5.0  !                           .             We received your handwritten test order and      performed the AMA defined lipid panel. If      this is not what you intended to order, please      contact your local client service representative      immediately so that we may adjust our billing      appropriately. You may also inquire about      alternative or additional testing.           Note: An exclamation mark (!) indicates a result that was not dispersed into   the flowsheet.  Document Creation Date: 04/28/2008 11:09 PM  _______________________________________________________________________    (1) Order result status: Final  Collection or observation date-time: 04/28/2008 09:55  Requested date-time:   Receipt date-time: 04/28/2008 09:56  Reported date-time: 04/28/2008 22:00  Referring Physician:    Ordering Physician: Bryanna Yim (HOVERMAK)  Specimen Source: S  Source: [name] Order Number: [id number]  Lab site: [facility] [place]      [address]      [place]  [place]  [address]

## 2008-04-28 NOTE — Unmapped (Signed)
Signed by Vira Browns MD on 04/30/2008 at 09:50:32  Patient: Wanda Rowe  Note: All result statuses are Final unless otherwise noted.    Tests: (1) HEMOGLOBIN A1c (QDL-496)   HEMOGLOBIN A1c (% of total Hgb)                         [H]  8.1 %      Reference Range                Non-Diabetic:  <6.0%                   REPORT COMMENT:      FASTING    Note: An exclamation mark (!) indicates a result that was not dispersed into   the flowsheet.  Document Creation Date: 04/29/2008 8:43 PM  _______________________________________________________________________    (1) Order result status: Final  Collection or observation date-time: 04/28/2008 09:55  Requested date-time:   Receipt date-time: 04/28/2008 09:56  Reported date-time: 04/29/2008 20:00  Referring Physician:    Ordering Physician: Joanna Borawski (HOVERMAK)  Specimen Source: B  Source: [name] Order Number: [id number]  Lab site: [facility] [place]      [address]      [place]  [place]  [address]

## 2008-04-28 NOTE — Unmapped (Signed)
Signed by Vira Browns MD on 04/30/2008 at 09:51:02  Patient: Wanda Rowe  Note: All result statuses are Final unless otherwise noted.    Tests: (1) IRON AND TOTAL IRON BINDING CAPACITY (QDL-7573)    IRON, TOTAL               74 mcg/dL                   40-102   IRON BINDING CAPACITY                              365 mcg/dL                  725-366    % SATURATION (calc)       20 %                        15-50    Note: An exclamation mark (!) indicates a result that was not dispersed into   the flowsheet.  Document Creation Date: 04/28/2008 11:09 PM  _______________________________________________________________________    (1) Order result status: Final  Collection or observation date-time: 04/28/2008 09:55  Requested date-time:   Receipt date-time: 04/28/2008 09:56  Reported date-time: 04/28/2008 22:00  Referring Physician:    Ordering Physician: Roda Lauture (HOVERMAK)  Specimen Source: S  Source: [name] Order Number: [id number]  Lab site: [facility] [place]      [address]      [place]  [place]  [address]

## 2008-05-02 NOTE — Unmapped (Signed)
Signed by Toniann Ket MD on 05/02/2008 at 11:09:45      Reason for Visit   Chief Complaint: Pre Op physical    History from: patient    Allergies  ! SULFA    Medications   SINGULAIR 10 MG TAB (MONTELUKAST SODIUM) one by mouth at bedtime  NEURONTIN 300 MG CAPS (GABAPENTIN) one by mouth tid  FLONASE 50 MCG/ACT SUSPN (FLUTICASONE PROPIONATE (NASAL)) two sprays each nostril at bedtime  ATROVENT 0.03 % SOLN (IPRATROPIUM BROMIDE) 2 sprays bid  VALTREX 500 MG TABS (VALACYCLOVIR HCL) one by mouth dailey  * CALCIUM CITRATE 1 qd  BENICAR HCT 20-12.5 MG TABS (OLMESARTAN MEDOXOMIL-HCTZ) one by mouth daily  GLUCOPHAGE XR 500 MG TB24 (METFORMIN HCL) 1 qd  SYNTHROID 75 MCG TABS (LEVOTHYROXINE SODIUM) one by mouth daily  * LOVASA one by mouth twice a day  VITAMIN D 27253 UNIT CAPS (ERGOCALCIFEROL) 1 by mouth every two weeks x 2 mos then 1 every month  VALTREX 500 MG TABS (VALACYCLOVIR HCL) one by mouth qd       Vital Signs:   Wt: 274 lbs.      BMI: 40.61  BSA: 2.36  Wt chg (lbs): -3  Temperature: 98.6  degrees F  oral  Pulse: 76 (regular)  Resp: 16    Patient appears to be in acute distress: no  BP: 122/80  Cuff size: regular    Intake recorded by: Fanny Dance MA on May 02, 2008 10:13 AM  I have been asked to perform a pre-operative/pre-procedure consult of this patient by: Dr.Joseph Caligarish  Procedure: Hysterectomy  Date of procedure: 05/08/2008      PAST HISTORY  Past Medical History (reviewed - no changes required):  Hyperlipidemia, Hypertension, Diabetes Type II, Hypothyroidism, GERD, Stomach polyps, RSD right knee, Anxiety, Mild Asthma, .    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   Ears/Nose/Throat: Denies decreased hearing, dysphagia, ear discharge, earache, facial pain, headaches, hoarsness, mouth ulcers/sores, nasal congestion, nasal ulcers/sores, nosebleeds, post nasal drip, rhinorrhea, sinus pain, sinusitis, sneezing, sore throat, tinnitus.   Cardiovascular: Denies chest pains, dizziness, dyspnea on exertion, dyspnea at rest, palpitations, peripheral edema, PND,  presyncope, orthopnea, syncope.   Respiratory: Denies dyspnea, tachypnea, excessive sputum, hemoptysis, wheezing, productive cough, dry cough, choking, clubbing, sputum, stridor, exercise limitation, chest wall deformity, frequent throat clearing, chest pain, cyanosis, nocturnal cough, exercise-induced cough,  hypersomnelance, daytime sleepiness. feels like she is getting tickle in throat  Gastrointestinal: Denies nausea, vomiting, diarrhea, constipation, change in bowel habits, abdominal pain, melena, hematochezia, jaundice, spitting, encopresis, hematemesis, abdominal distention, edema, ascites, belching, dysphagia, early satiety, heartburn/indigestion, regurgitation/reflux.   Genitourinary: Denies vaginal discharge, incontinence, day time wetting, dysuria, hematuria, urinary frequency, amenorrhea, menorrhagia, abnormal vaginal bleeding, pelvic  pain, nocturia, flank pain, weak stream, precipitance, incomplete empty, retention, enuresis, perineal rash, tea-colored urine.   Neurologic: Denies headache, transient paralysis, weakness, paresthesias, seizures, syncope, tremors, vertigo, poor coordination, history of falls.   Endocrine: Denies cold intolerance,  heat intolerance, polydipsia, polyphagia, polyuria, weight change, growth problems. rx given  Heme/Lymphatic: Denies bone pain, abnormal bruising,  bleeding, enlarged lymph nodes, node tenderness, pallor.   Allergic/Immunologic: sulfa meds; succinly choline adverse reaction      Physical Examination:   BP: 122/  80    Physical Exam- Detail:   General Appearance: well-developed, well-nourished and in no acute distress.  Skin: No suspicious rashes or lesions.  Eyes: Sclera white, conjunctiva without injection and pallor.  PERRLA.  EOMI  Ears: No lesions.  Tympanic membranes translucent, non-bulging.  Canal walls pink, without discharge.  Hearing grossly intact.  Nose/Face: Mucosa and turbinates pink, septum midline. No polyps, no discharge, no lesions.  Oropharynx: Normal appearance.  No erythema, exudate or mass. No tonsillar swelling.  Oral Cavity: Gums pink, good dentition.  Oral mucosa and tongue without lesions.  Respiratory: Respiration un-labored.  Lung fields clear to auscultation.  No wheezing, rales, rhonchi or pleural rub.  Neck: No thyromegaly.  No nodules, masses or tenderness.  Lymphatic: Areas palpated not enlarged:  cervical, supraclavicular.  Cardiac: S1 and S2 normal.  RRR without murmurs, rubs, gallops.  No JVD.  Vascular: No carotid bruits.  No edema or varicosities.  Pedal Pulse Left: dorsalis pedis +2, posterior tib +2  Pedal Pulse Right: dorsalis pedis +2,  posterior tib +2  Diabetic Foot Check:   Skin integrity intact.  No splitting, ulcers or calluses.  Abdomen: No masses or tenderness. Bowel sounds active x4 quad.  Liver and spleen are without tenderness or enlargement.  No hernias.  Neurologic: Cranial nerves 2 through 12 intact.  Deep tendon reflexes 2+ bilaterally.  Sensation intact.  No spasticity.  Strength is 5/5 in upper and lower extremities bilaterally.  Psychiatric: Judgement and insight are within normal limits.  Alert and oriented x3.  No mood disorders noted, appropriate  affect.  Comments: no obvious deformity      Labs/Tests Reviewed  Hemoglobin: 13.6 (04/28/2008 9:48:00 AM)  Hematocrit: 38.8 (04/28/2008 9:48:00 AM)  MCV: 90.8 (04/28/2008 9:48:00 AM)  MCH: 31.9 (04/28/2008 9:48:00 AM)  WBC: 5.4 THOUSAND/UL (04/28/2008 9:48:00 AM)  Platelets: 216 THOUSAND/UL (04/28/2008 9:48:00 AM)  TIBC: 365 (04/28/2008 9:55:00 AM)  Ferritin: 254 (04/28/2008 9:55:00 AM)  Folate: 13.8 (04/28/2008 9:55:00 AM)  Vitamin B12: 370 (04/28/2008 9:55:00 AM)  TSH: 1.47 (10/30/2004 1:46:00 AM)  TSH: 0.99 (04/28/2008 9:55:00 AM)  HgbA1C 8.1 (04/28/2008 9:55:00 AM)  Serum Glucose: 162 (04/28/2008 9:55:00 AM)  Total Protein: 7.3 (04/28/2008 9:55:00 AM)  Albumin: 4.6 (04/28/2008 9:55:00 AM)  AST: 61 (04/28/2008 9:55:00 AM)  ALT: 71 (04/28/2008 9:55:00 AM)  Alkaline Phosphatase: 88 (04/28/2008 9:55:00 AM)  Total Bilirubin: 0.5 (04/28/2008 9:55:00 AM)  Direct Bilirubin: 0.6 (06/16/2005 1:07:00 PM)  Serum Cholesterol 210 (04/28/2008 9:55:00 AM)  LDL: 124 (04/28/2008 9:55:00 AM)  HDL: 42 (04/28/2008 9:55:00 AM)  Triglyceride: 220 (04/28/2008 9:55:00 AM)  Total Protein: 6.6 (01/30/2006 10:28:00 AM)  Sodium: 139 (04/28/2008 9:55:00 AM)  Potassium: 4.5 (04/28/2008 9:55:00 AM)  Chloride: 103 (04/28/2008 9:55:00 AM)  BUN: 16 (04/28/2008 9:55:00 AM)  Creatinine: 0.7 (04/28/2008 9:55:00 AM)  Serum Calcium: 9.9 (04/28/2008 9:55:00 AM)       New Problems:  LEIOMYOMA, UTERUS (ICD-218.9)  New Medications:  ZITHROMAX TRI-PAK  TABS (AZITHROMYCIN TABS) 1 daily  Preventive Maintenance       Coordinating Care Providers   PCP Name: Dr. Gari Crown             Prescriptions:  Christena Deem TRI-PAK  TABS (AZITHROMYCIN TABS) 1 daily  #1 x 0   Entered and Authorized by: Toniann Ket MD   Signed by: Toniann Ket MD on 05/02/2008   Method used: Print then Give to Patient   RxID: [id number]      Assessment and Plan     Problems   Status of Existing Problems:  Assessed LEIOMYOMA, UTERUS as comment only - tripak fo uri - Toniann Ket MD - Signed  New Problems:  Dx of LEIOMYOMA, UTERUS (ICD-218.9)  Onset: 05/02/2008    Medications   New Prescriptions/Refills:  ZITHROMAX TRI-PAK  TABS (AZITHROMYCIN TABS) 1 daily  #1 x 0, 05/02/2008, Toniann Ket MD    Today's Orders   [phone] - Ofc Consult, Level IV [GEX-52841]  EKG w/ Interpretation [CPT-93000]

## 2008-05-02 NOTE — Unmapped (Signed)
Signed by Irena Cords MD on 05/02/2008 at 00:00:00  EKG      Imported By: Vance Peper 05/03/2008 09:52:06    _____________________________________________________________________    External Attachment:    Please see [employer] EMR for this document.

## 2008-05-07 NOTE — Unmapped (Signed)
Signed by Nonie Hoyer MA on 05/07/2008 at 08:46:57    Prescriptions:  NEURONTIN 300 MG CAPS (GABAPENTIN) one by mouth tid  #90 x 1   Entered by: Nonie Hoyer MA   Authorized by: Irena Cords MD   Signed by: Nonie Hoyer MA on 05/07/2008   Method used: Telephoned to ...     [facility] [place] [facility]     [address]     [place], [place]  [address]     Ph: [phone]     Fax: [phone]   RxID: [id number]

## 2008-05-08 NOTE — Unmapped (Signed)
Signed by Irena Cords MD on 05/08/2008 at 00:00:00  Operative Report      Imported By: Link Snuffer 06/21/2008 15:23:12    _____________________________________________________________________    External Attachment:    Please see [facility] EMR for this document.

## 2008-06-01 NOTE — Unmapped (Signed)
Signed by Irena Cords MD on 06/01/2008 at 10:08:49      Reason for Visit   Chief Complaint: stiff neck, and pain for 5 days      Allergies  ! SULFA  Allergy and adverse reaction list reviewed during this update.      Medications   SINGULAIR 10 MG TAB (MONTELUKAST SODIUM) one by mouth at bedtime  NEURONTIN 300 MG CAPS (GABAPENTIN) one by mouth tid  FLONASE 50 MCG/ACT SUSPN (FLUTICASONE PROPIONATE (NASAL)) two sprays each nostril at bedtime  ATROVENT 0.03 % SOLN (IPRATROPIUM BROMIDE) 2 sprays bid  VALTREX 500 MG TABS (VALACYCLOVIR HCL) one by mouth dailey  * CALCIUM CITRATE 1 qd  BENICAR HCT 20-12.5 MG TABS (OLMESARTAN MEDOXOMIL-HCTZ) one by mouth daily  GLUCOPHAGE XR 500 MG TB24 (METFORMIN HCL) 1 qd  SYNTHROID 75 MCG TABS (LEVOTHYROXINE SODIUM) one by mouth daily  * LOVASA one by mouth twice a day  VITAMIN D 65784 UNIT CAPS (ERGOCALCIFEROL) 1 by mouth every two weeks x 2 mos then 1 every month  VALTREX 500 MG TABS (VALACYCLOVIR HCL) one by mouth qd          Vital Signs:   Wt: 266 lbs.      BMI: 39.42  BSA: 2.33  Wt chg (lbs): -8  Pulse: 64 (regular)    Patient appears to be in acute distress: no  BP: 120/84  Cuff size: large    Intake recorded by: Nonie Hoyer MA on June 01, 2008 9:31 AM        HPI Multiple Chronic   Additional Dx: tah 3 weeks ago sleeping flat on back 5 days ago developed l cervical pain to head. normally stretches neck cant do.troied advil 600 mg no relief.    ROS/Symptoms:   Patient denies any cardiovascular symptoms.  Patient denies any respiratory symptoms.    PAST HISTORY  Past Medical History (reviewed - no changes required):  Hyperlipidemia, Hypertension, Diabetes Type II, Hypothyroidism, GERD, Stomach polyps, RSD right knee, Anxiety, Mild Asthma, .    Surgical History (reviewed - no changes required):  ACL Repair: x left knee, Meniscal Repair: left knee x 2,  right knee x 1, Peridontal surgery          Physical Examination:   BP: 120/  84  Musculoskeletal: dtr 2 plus sens  intact  Cervical Spine tender l paravertebral           New Problems:  CERVICAL STRAIN, LEFT (ICD-847.0)  New Medications:  EC-NAPROSYN 500 MG TBEC (NAPROXEN) 1 bid  *        Preventive Maintenance       Coordinating Care Providers   PCP Name: Dr. Gari Crown             Prescriptions:  SKELAXIN 800 MG TABS (METAXALONE) one by mouth three times a day  #90 x 0   Entered and Authorized by: Bayani Renteria Johnn Hai MD   Signed by: Irena Cords MD on 06/01/2008   Method used: Print then Give to Patient   RxID: [id number]  EC-NAPROSYN 500 MG TBEC (NAPROXEN) 1 bid  #60 x 0   Entered and Authorized by: Mirriam Vadala Johnn Hai MD   Signed by: Irena Cords MD on 06/01/2008   Method used: Print then Give to Patient   RxID: [id number]      Assessment and Plan     Problems   Status of Existing Problems:  Assessed CERVICAL STRAIN, LEFT as new - skelaxin, naprosyn,  PT - Shaela Boer Johnn Hai MD  New Problems:  Dx of CERVICAL STRAIN, LEFT (ICD-847.0)  Onset: 06/01/2008    Medications   New Prescriptions/Refills:  SKELAXIN 800 MG TABS (METAXALONE) one by mouth three times a day  #90 x 0, 06/01/2008, Tarius Stangelo K Carrel Leather MD  EC-NAPROSYN 500 MG TBEC (NAPROXEN) 1 bid  #60 x 0, 06/01/2008, Shantai Tiedeman K Shariece Viveiros MD    Today's Orders   99213 - Ofc Vst, Est Level III [[id number]]

## 2008-06-05 NOTE — Unmapped (Signed)
Signed by Irena Cords MD on 06/05/2008 at 00:00:00  Rehab Report      Imported By: Dahlia Byes 06/18/2008 10:12:25    _____________________________________________________________________    External Attachment:    Please see [employer] EMR for this document.

## 2008-06-28 NOTE — Unmapped (Signed)
Signed by Irena Cords MD on 06/28/2008 at 00:00:00  Rehab Report- [facility]      Imported By: Link Snuffer 07/23/2008 09:07:53    _____________________________________________________________________    External Attachment:    Please see [employer] EMR for this document.

## 2008-06-28 NOTE — Unmapped (Signed)
Signed by Nonie Hoyer MA on 06/28/2008 at 07:49:31    Prescriptions:  LOVASA one by mouth twice a day  #60 x 3   Entered by: Nonie Hoyer MA   Authorized by: Irena Cords MD   Signed by: Nonie Hoyer MA on 06/28/2008   Method used: Telephoned to ...     [facility] [place] [facility]     [address]     [place], [place]  [address]     Ph: [phone]     Fax: [phone]   RxID: [id number]

## 2008-09-05 NOTE — Unmapped (Signed)
Signed by Nonie Hoyer MA on 09/05/2008 at 15:23:39    Prescriptions:  NEURONTIN 300 MG CAPS (GABAPENTIN) one by mouth tid  #30 x 0   Entered by: Nonie Hoyer MA   Authorized by: Irena Cords MD   Signed by: Nonie Hoyer MA on 09/05/2008   Method used: Telephoned to ...     [facility] [place] [facility]     [address]     [place], [place]  [address]     Ph: [phone]     Fax: [phone]   RxID: [id number]

## 2008-09-06 NOTE — Unmapped (Signed)
Signed by Nonie Hoyer MA on 09/06/2008 at 08:41:43    Prescriptions:  BENICAR HCT 20-12.5 MG TABS (OLMESARTAN MEDOXOMIL-HCTZ) one by mouth daily  #30 x 0   Entered by: Nonie Hoyer MA   Authorized by: Irena Cords MD   Signed by: Nonie Hoyer MA on 09/06/2008   Method used: Telephoned to ...     [facility] [place] [facility]     [address]     [place], [place]  [address]     Ph: [phone]     Fax: [phone]   RxID: [id number]      Needs appt

## 2008-09-11 NOTE — Unmapped (Signed)
Signed by Irena Cords MD on 09/11/2008 at 00:00:00  External Correspondence [facility]      Imported By: Dahlia Byes 01/30/2009 14:01:03    _____________________________________________________________________    External Attachment:    Please see [facility] EMR for this document.

## 2008-09-11 NOTE — Unmapped (Signed)
Signed by Shirlean Mylar on 09/13/2008 at 08:20:31    PHONE NOTE  Caller's Cell Phone #: [phone]   Caller: patient  Department: IM - General  Call for: [facility]     Reason for Call: pt needs a fasting lab order for check up       Initial call taken by: Avanell Shackleton,  September 11, 2008 3:50 PM    Current Allergies:   ! SULFA    New Orders:  CMP   (METAPNL) (10231) [CPT-80053]  Lipid Profile   (FATS) (7600) [CPT-80061]  B-12  (B12)  (927) [*CPT-82607]  Folic Acid  (FOLATE) (466) [EVO-35009]  TSH   (TSH) (899) [CPT-84443]  Hemoglobin  A1C   (GLYCO) (496)  [CPT-83036]  Vitamin D, 25-Hydrodroxy  (VITMD25) (17306) [CPT-82306]  C-Reactive Protein, High Sensitivity    (CRPHS) (10124) [CPT-86141]  Homocysteine Level  (HOMOCYT) (38182) [CPT-83090]

## 2008-09-13 DIAGNOSIS — E559 Vitamin D deficiency, unspecified: Secondary | ICD-10-CM | POA: Insufficient documentation

## 2008-09-13 LAB — LIPID PANEL
Chol/HDL Ratio: 5.2 (ref <=5.0)
Cholesterol, Total: 239 mg/dL (ref 125–200)
HDL: 46 mg/dL (ref >=46)
LDL Cholesterol: 138 mg/dL (ref <130)
Triglycerides: 276 mg/dL (ref <150)

## 2008-09-13 LAB — HEMOGLOBIN A1C: Hemoglobin A1C: 7.9 % (ref <5.7)

## 2008-09-13 LAB — COMPREHENSIVE METABOLIC PANEL
A/G Ratio: 1.7 (ref 1.0–2.1)
ALT: 59 units/L (ref 6–40)
AST: 57 units/L (ref 10–35)
Albumin: 4.6 g/dL (ref 3.6–5.1)
Alkaline Phosphatase: 81 units/L (ref 33–115)
BUN: 17 mg/dL (ref 7–25)
CO2: 26 mmol/L (ref 21–33)
Calcium: 10.1 mg/dL (ref 8.6–10.2)
Chloride: 103 mmol/L (ref 98–110)
Creatinine: 0.65 mg/dL (ref 0.59–1.07)
GFR MDRD Af Amer: >60 mL/min (ref >=60)
GFR MDRD Non Af Amer: >60 mL/min (ref >=60)
Globulin, Total: 2.7 g/dL (ref 2.2–3.9)
Glucose: 164 mg/dL (ref 65–99)
Potassium: 4.4 mmol/L (ref 3.5–5.3)
Sodium: 140 mmol/L (ref 135–146)
Total Bilirubin: 0.6 mg/dL (ref 0.2–1.2)
Total Protein: 7.3 g/dL (ref 6.2–8.3)

## 2008-09-13 LAB — VITAMIN D 25 HYDROXY
Vit D, 25-Hydroxy: 33 ng/mL (ref 30–100)
Vitamin D2, 1,25 (~~LOC~~)2: 8 ng/mL
Vitamin D3, 1,25 (~~LOC~~)2: 25 ng/mL

## 2008-09-13 LAB — TSH: TSH: 1.35 microintl units/mL

## 2008-09-13 LAB — VITAMIN B12: Vitamin B-12: 377 pg/mL (ref 200–1100)

## 2008-09-13 LAB — FOLATE: Folic Acid: 19.5 ng/mL

## 2008-09-13 NOTE — Unmapped (Signed)
Signed by Irena Cords MD on 09/16/2008 at 07:36:05  Patient: Wanda Rowe  Note: All result statuses are Final unless otherwise noted.    Tests: (1) COMPREHENSIVE METABOLIC PANEL W/EGFR (QDL-10231)    GLUCOSE              [H]  164 mg/dL                   54-09                        Fasting reference interval           UREA NITROGEN (BUN)       17 mg/dL                    8-11    CREATININE                0.65 mg/dL                  0.59-1.07   eGFR NON-AFR. AMERICAN                              >60 mL/min/1.69m2           > OR = 60   eGFR AFRICAN AMERICAN                              >60 mL/min/1.1m2           > OR = 60   BUN/CREATININE RATIO (calc)                              NOT APPLICABLE              6-22             Bun/Creatinine ratio is not reported when the BUN      and creatinine values are within normal limits.           SODIUM                    140 mmol/L                  135-146    POTASSIUM                 4.4 mmol/L                  3.5-5.3    CHLORIDE                  103 mmol/L                  98-110    CARBON DIOXIDE            26 mmol/L                   21-33    CALCIUM                   10.1 mg/dL                  9.1-47.8    PROTEIN, TOTAL            7.3 g/dL  6.2-8.3    ALBUMIN                   4.6 g/dL                    4.7-4.2    GLOBULIN (calc)           2.7 g/dL                    5.9-5.6   ALBUMIN/GLOBULIN RATIO (calc)                              1.7                         1.0-2.1    BILIRUBIN, TOTAL          0.6 mg/dL                   3.8-7.5    ALKALINE PHOSPHATASE      81 U/L                      33-115    AST                  [H]  57 U/L                      10-35    ALT                  [H]  59 U/L                      6-40            REPORT COMMENT:      FASTING    Note: An exclamation mark (!) indicates a result that was not dispersed into   the flowsheet.  Document Creation Date: 09/13/2008 8:21  PM  _______________________________________________________________________    (1) Order result status: Final  Collection or observation date-time: 09/13/2008 09:40  Requested date-time:   Receipt date-time: 09/13/2008 09:41  Reported date-time: 09/13/2008 20:00  Referring Physician:    Ordering Physician: Lalo Tromp (HOVERMAK)  Specimen Source: S  Source: [name] Order Number: [id number]  Lab site: [facility] [place]      [address]      [place]  [facility]  [address]      -----------------    The following non-numeric lab results were dispersed to  the flowsheet even though numeric results were expected:      BUN/CREATININE RATIO (calc), NOT APPLICABLE

## 2008-09-13 NOTE — Unmapped (Signed)
Signed by Irena Cords MD on 09/16/2008 at 07:36:05  Patient: Wanda Rowe  Note: All result statuses are Final unless otherwise noted.    Tests: (1) TSH, 3RD GENERATION (QDL-899)    TSH, 3RD GENERATION       1.35 mIU/L      Reference Range              > or = 20 Years  0.40-4.50                   Pregnancy Ranges       First trimester    0.20-4.70       Second trimester   0.30-4.10       Third trimester    0.40-2.70    Note: An exclamation mark (!) indicates a result that was not dispersed into   the flowsheet.  Document Creation Date: 09/13/2008 10:54 PM  _______________________________________________________________________    (1) Order result status: Final  Collection or observation date-time: 09/13/2008 09:40  Requested date-time:   Receipt date-time: 09/13/2008 09:41  Reported date-time: 09/13/2008 22:00  Referring Physician:    Ordering Physician: Luiscarlos Kaczmarczyk (HOVERMAK)  Specimen Source: S  Source: [name] Order Number: [id number]  Lab site: [facility] [place]      [address]      [place]  [place]  [address]

## 2008-09-13 NOTE — Unmapped (Signed)
Signed by Irena Cords MD on 09/16/2008 at 07:35:35  Patient: Wanda Rowe  Note: All result statuses are Final unless otherwise noted.    Tests: (1) HEMOGLOBIN A1c (QDL-496)   HEMOGLOBIN A1c (% of total Hgb)                         [H]  7.9 %                       <5.7                      Consistent with diabetes                       <5.7       Decreased risk of diabetes                       5.7-6.0    Increased risk of diabetes                       6.1-6.4    Higher risk of diabetes                       > or = 6.5 Consistent with diabetes                         Standards of Medical Care in Diabetes-2010.                  Diabetes Care, 33(Supp 1): Z6-X09,6045.            REPORT COMMENT:      FASTING    Note: An exclamation mark (!) indicates a result that was not dispersed into   the flowsheet.  Document Creation Date: 09/13/2008 10:54 PM  _______________________________________________________________________    (1) Order result status: Final  Collection or observation date-time: 09/13/2008 09:40  Requested date-time:   Receipt date-time: 09/13/2008 09:41  Reported date-time: 09/13/2008 22:00  Referring Physician:    Ordering Physician: Redith Drach (HOVERMAK)  Specimen Source: B  Source: [name] Order Number: [id number]  Lab site: [facility] [place]      [address]      [place]  [place]  [address]

## 2008-09-13 NOTE — Unmapped (Signed)
Signed by Irena Cords MD on 09/16/2008 at 07:36:05  Patient: Wanda Rowe  Note: All result statuses are Final unless otherwise noted.    Tests: (1) LIPID PANEL (QDL-7600)    CHOLESTEROL, TOTAL   [H]  239 mg/dL                   782-956    HDL CHOLESTEROL           46 mg/dL                    > OR = 46    TRIGLYCERIDES        [H]  276 mg/dL                   <213   LDL-CHOLESTEROL (calc)                         [H]  138 mg/dL                   <086             Desirable range <100 mg/dL for patients with CHD or      diabetes and <70 mg/dL for diabetic patients with      known heart disease.          CHOL/HDLC RATIO (calc)                         [H]  5.2                         < OR = 5.0    Note: An exclamation mark (!) indicates a result that was not dispersed into   the flowsheet.  Document Creation Date: 09/13/2008 8:21 PM  _______________________________________________________________________    (1) Order result status: Final  Collection or observation date-time: 09/13/2008 09:40  Requested date-time:   Receipt date-time: 09/13/2008 09:41  Reported date-time: 09/13/2008 20:00  Referring Physician:    Ordering Physician: Azad Calame (HOVERMAK)  Specimen Source: S  Source: [name] Order Number: [id number] [id number]  Lab site: [facility] [place]      [address]      [place]  [place]  [address]

## 2008-09-13 NOTE — Unmapped (Signed)
Signed by Irena Cords MD on 09/16/2008 at 07:35:35  Patient: Wanda Rowe  Note: All result statuses are Final unless otherwise noted.    Tests: (1) VITAMIN B12 (QDL-927)    VITAMIN B12               377 pg/mL                   [phone]             Please Note: Although the reference range for vitamin      B12 is [phone] pg/mL, it has been reported that between      5 and 10% of patients with values between 200 and 400      pg/mL may experience neuropsychiatric and hematologic      abnormalities due to occult B12 deficiency; less than 1%      of patients with values above 400 pg/mL will have symptoms.                   REPORT COMMENT:      FASTING    Note: An exclamation mark (!) indicates a result that was not dispersed into   the flowsheet.  Document Creation Date: 09/14/2008 11:45 AM  _______________________________________________________________________    (1) Order result status: Final  Collection or observation date-time: 09/13/2008 09:40  Requested date-time:   Receipt date-time: 09/13/2008 09:41  Reported date-time: 09/14/2008 11:00  Referring Physician:    Ordering Physician: Leonila Speranza (HOVERMAK)  Specimen Source: S  Source: [name] Order Number: [id number]  Lab site: [facility] [place]      [address]      [place]  [place]  [address]

## 2008-09-13 NOTE — Unmapped (Signed)
Signed by Irena Cords MD on 09/16/2008 at 07:35:35  Patient: Wanda Rowe  Note: All result statuses are Final unless otherwise noted.    Tests: (1) FOLATE, SERUM (QDL-466)    FOLATE, SERUM             19.5 ng/mL                                 Reference Range                                 Low:           <3.4                                 Borderline:    3.4-5.4                                 Normal:        >5.4           Note: An exclamation mark (!) indicates a result that was not dispersed into   the flowsheet.  Document Creation Date: 09/14/2008 11:45 AM  _______________________________________________________________________    (1) Order result status: Final  Collection or observation date-time: 09/13/2008 09:40  Requested date-time:   Receipt date-time: 09/13/2008 09:41  Reported date-time: 09/14/2008 11:00  Referring Physician:    Ordering Physician: Devlynn Knoff (HOVERMAK)  Specimen Source: S  Source: [name] Order Number: [id number]  Lab site: [facility] [place]      [address]      [place]  [place]  [address]

## 2008-09-13 NOTE — Unmapped (Signed)
Signed by Irena Cords MD on 09/13/2008 at 09:40:10      Reason for Visit   Chief Complaint: med check      Allergies  ! SULFA  Allergy and adverse reaction list reviewed during this update.      Medications   SINGULAIR 10 MG TAB (MONTELUKAST SODIUM) one by mouth at bedtime  NEURONTIN 300 MG CAPS (GABAPENTIN) one by mouth tid  FLONASE 50 MCG/ACT SUSPN (FLUTICASONE PROPIONATE (NASAL)) two sprays each nostril at bedtime  ATROVENT 0.03 % SOLN (IPRATROPIUM BROMIDE) 2 sprays bid  VALTREX 500 MG TABS (VALACYCLOVIR HCL) one by mouth dailey  * CALCIUM CITRATE 1 qd  BENICAR HCT 20-12.5 MG TABS (OLMESARTAN MEDOXOMIL-HCTZ) one by mouth daily  GLUCOPHAGE XR 500 MG TB24 (METFORMIN HCL) 1 qd  SYNTHROID 75 MCG TABS (LEVOTHYROXINE SODIUM) one by mouth daily  * LOVASA one by mouth twice a day  VITAMIN D 11914 UNIT CAPS (ERGOCALCIFEROL) 1 by mouth every two weeks x 2 mos then 1 every month  VALTREX 500 MG TABS (VALACYCLOVIR HCL) one by mouth qd  EC-NAPROSYN 500 MG TBEC (NAPROXEN) 1 bid  SKELAXIN 800 MG TABS (METAXALONE) one by mouth three times a day        Vital Signs:   Ht: 69 in.  Wt: 272 lbs.      BMI: 40.31  BSA: 2.36  Wt chg (lbs): 6  degrees C Pulse: 64 (regular)  Resp: 16    Patient appears to be in acute distress: no  BP: 122/80  Cuff size: regular    Intake recorded by: Nonie Hoyer MA on September 13, 2008 8:43 AM        HPI Multiple Chronic   Additional Dx: Here to get refills on medicine, was diagnosed with DM past year and is wondering what she should be doing to manage this illness.  Also has bump on left medial antecubital for the past several years, possible enlarging recently.  Mother had similar bump which went away on its own. had tah in april in hospital 2 nights and gave insulin.     ROS/Symptoms:   Patient denies any cardiovascular symptoms.  Patient denies any respiratory symptoms.  Patient denies any GI symptoms.  Patient denies any musculoskeletal symptoms.  Patient denies any psychological  symptoms.    PAST HISTORY  Past Medical History (reviewed - no changes required):  Hyperlipidemia, Hypertension, Diabetes Type II, Hypothyroidism, GERD, Stomach polyps, RSD right knee, Anxiety, Mild Asthma, .    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  Respiratory: Respiration un-labored.  Lung fields clear to auscultation.  No wheezing, rales, rhonchi or pleural rub.  Cardiac: S1 and  S2 normal.  RRR without murmurs, rubs, gallops.  No JVD.  Vascular: No carotid bruits.  No edema or varicosities.  Psychiatric: Judgement and insight are within normal limits.  Alert and oriented x3.  No mood disorders noted, appropriate affect.  Left Upper Extremity: b antecubital fossas with palp mass l>r           New Problems:  VITAMIN D DEFICIENCY (ICD-268.9)  LIPOMA (ICD-214.9)  New Medications:  * LOVASA 1 GRAM 4 qd  VITAMIN D 16109 UNIT CAPS (ERGOCALCIFEROL) 1 every month  * GLUCOMETER fsbs bid  * LANCETS TEST STRIPS fsbs bid      Preventive Maintenance       Coordinating Care Providers   PCP Name: Dr. Gari Crown             Prescriptions:  FLONASE 50 MCG/ACT SUSPN (FLUTICASONE PROPIONATE (NASAL)) two sprays each nostril at bedtime  #1 x 5   Entered and Authorized by: Jaidee Stipe Johnn Hai MD   Signed  by: Irena Cords MD on 09/13/2008   Method used: Print then Give to Patient   RxID: [id number]  ATROVENT 0.03 % SOLN (IPRATROPIUM BROMIDE) 2 sprays bid  #1 x 5   Entered and Authorized by: Shemica Meath Johnn Hai MD   Signed by: Irena Cords MD on 09/13/2008   Method used: Print then Give to Patient   RxID: [id number]  LANCETS TEST STRIPS fsbs bid  #100 x 1   Entered and Authorized by: Tommie Dejoseph Johnn Hai MD   Signed by: Irena Cords MD on 09/13/2008   Method used: Print then Give to Patient   RxID: [id number]  GLUCOMETER fsbs bid  #1 x 0   Entered and Authorized by: Rodgerick Gilliand Johnn Hai MD   Signed by: Irena Cords MD on 09/13/2008   Method used: Print then Give to Patient   RxID: [id number]  VITAMIN D 63875 UNIT CAPS (ERGOCALCIFEROL) 1 every month  #4 x 5   Entered and Authorized by: Khanh Cordner Johnn Hai MD   Signed by: Irena Cords MD on 09/13/2008   Method used: Print then Give to Patient   RxID: [id number]  LOVASA 1 GRAM 4 qd  #120 x 5   Entered and Authorized by: Miraj Truss Johnn Hai MD   Signed by: Irena Cords MD on 09/13/2008   Method used: Print then Give to Patient   RxID: [id number]  SYNTHROID 75 MCG TABS (LEVOTHYROXINE SODIUM) one by mouth daily  #30 x 5   Entered and Authorized by: Tyashia Morrisette Johnn Hai MD   Signed by: Irena Cords MD on 09/13/2008   Method used: Print then Give to Patient   RxID: [id number]  GLUCOPHAGE XR 500 MG TB24 (METFORMIN HCL) 1 qd  #30 x 5   Entered and Authorized by: Rishav Rockefeller Johnn Hai MD   Signed by: Irena Cords MD on 09/13/2008   Method used: Print then Give to Patient   RxID: [id number]  BENICAR HCT 20-12.5 MG TABS (OLMESARTAN MEDOXOMIL-HCTZ) one by mouth daily  #30 x 5   Entered and Authorized by: Claressa Hughley Johnn Hai MD   Signed by: Irena Cords MD on 09/13/2008   Method used: Print then Give to Patient   RxID: [id number]  VALTREX 500 MG TABS (VALACYCLOVIR HCL) one by mouth dailey  #30 x 5   Entered and Authorized by: Dyanne Yorks Johnn Hai MD   Signed by: Irena Cords MD on 09/13/2008   Method used: Print then  Give to Patient   RxID: [id number]  NEURONTIN 300 MG CAPS (GABAPENTIN) one by mouth tid  #90 x 5   Entered and Authorized by: Tiffancy Moger Johnn Hai MD   Signed by: Irena Cords MD on 09/13/2008   Method used: Print then Give to Patient   RxID: [id number]  SINGULAIR 10 MG TAB (MONTELUKAST SODIUM) one by mouth at bedtime  #30 x 5   Entered and Authorized by: Leo Weyandt Johnn Hai MD   Signed by: Irena Cords MD on 09/13/2008   Method used: Print then Give to Patient   RxID: [id number]      Assessment and Plan     Problems   Status of Existing Problems:  Assessed VITAMIN D DEFICIENCY as unchanged - continue current medications - Tymber Stallings K Isamar Nazir MD  Assessed LIPOMA as deteriorated - refer to hand - Hyun Marsalis K Chalet Kerwin MD  Assessed TRANSAMINASES, SERUM, ELEVATED as comment only - recheck - Williemae Muriel K Tavyn Kurka MD  Assessed HYPOTHYROIDISM as improved - recheck, continue current medications - Shivani Barrantes K Bastian Andreoli MD  Assessed B12 DEFICIENCY as improved - continue current medications - Lenin Kuhnle K Mikias Lanz MD  Assessed DIABETES MELLITUS, TYPE II as improved - continue current medications, diabetic teaching - Kairee Kozma K Victora Irby MD  Assessed HYPERTRIGLYCERIDEMIA as improved - recheck, continue current medications - Marika Mahaffy K Cleotha Whalin MD  Assessed HYPERCHOLESTEROLEMIA as improved - recheck - Pasquale Matters K Nanda Bittick MD  Assessed HYPERTENSION as improved - continue current medications - Kassity Woodson K Sherita Decoste MD  New Problems:  Dx of VITAMIN D DEFICIENCY (ICD-268.9)  Onset: 09/13/2008  Dx of LIPOMA (ICD-214.9)  Onset: 09/13/2008    Medications   New Prescriptions/Refills:  FLONASE 50 MCG/ACT SUSPN (FLUTICASONE PROPIONATE (NASAL)) two sprays each nostril at bedtime  #1 x 5, 09/13/2008, Deigo Alonso K Rylend Pietrzak MD  ATROVENT 0.03 % SOLN (IPRATROPIUM BROMIDE) 2 sprays bid  #1 x 5, 09/13/2008, Shmuel Girgis K Tobechukwu Emmick MD  LANCETS TEST STRIPS fsbs bid  #100 x 1, 09/13/2008, Turkessa Ostrom K Rinaldo Macqueen  MD  GLUCOMETER fsbs bid  #1 x 0, 09/13/2008, Davi Rotan K Lowella Kindley MD  VITAMIN D 01601 UNIT CAPS (ERGOCALCIFEROL) 1 every month  #4 x 5, 09/13/2008, Gennell How K Daegen Berrocal MD  LOVASA 1 GRAM 4 qd  #120 x 5, 09/13/2008, Sontee Desena K Mikyle Sox MD  SYNTHROID 75 MCG TABS (LEVOTHYROXINE SODIUM) one by mouth daily  #30 x 5, 09/13/2008, Octavious Zidek K Gursimran Litaker MD  GLUCOPHAGE XR 500 MG TB24 (METFORMIN HCL) 1 qd  #30 x 5, 09/13/2008, Ayleah Hofmeister K Finn Amos MD  BENICAR HCT 20-12.5 MG TABS (OLMESARTAN MEDOXOMIL-HCTZ) one by mouth daily  #30 x 5, 09/13/2008, Kaicee Scarpino K Maysen Sudol MD  VALTREX 500 MG TABS (VALACYCLOVIR HCL) one by mouth dailey  #30 x 5, 09/13/2008, Delvonte Berenson K Jacorian Golaszewski MD  NEURONTIN 300 MG CAPS (GABAPENTIN) one by mouth tid  #90 x 5, 09/13/2008, Jaramie Bastos K Lissa Rowles MD  SINGULAIR 10 MG TAB (MONTELUKAST SODIUM) one by mouth at bedtime  #30 x 5, 09/13/2008, Lai Hendriks K Tramell Piechota MD    Today's Orders   Orthopedic Consultation [APC-11111]  CMP   (METAPNL) (10231) [CPT-80053]  Lipid Profile   (FATS) (7600) [CPT-80061]  Hemoglobin  A1C   (GLYCO) (496)  [CPT-83036]  TSH   (TSH) (899) [CPT-84443]  B-12  (B12)  (927) [*CPT-82607]  Folic Acid  (FOLATE) (466) [UXN-23557]  Vitamin D, 25-Hydrodroxy  (VITMD25) (17306) [CPT-82306]  Diabetic Eduation, Individual [APC-11111]  99214 - Ofc Vst, Est Level IV [DUK-02542]

## 2008-09-13 NOTE — Unmapped (Signed)
Signed by Irena Cords MD on 09/17/2008 at 05:29:58  Patient: Wanda Rowe  Note: All result statuses are Final unless otherwise noted.    Tests: (1) VITAMIN D, 25-HYDROXY, LC/MS/MS (QMV-78469)   VITAMIN D, 25-[place], TOTAL                              33 ng/mL                    30-100             25-OHD3 indicates both endogenous production and      supplementation. 25-OHD2 is an indicator of exogenous      sources, such as diet or supplementation. Therapy is      based on measurement of Total 25-OHD, with levels       <20 ng/mL indicative of Vitamin D deficiency, while       levels between 20 ng/mL and 30 ng/mL suggest      insufficiency. Optimal levels are > or = 30 ng/mL.    VITAMIN D, 25-[place], D3      25 ng/mL      Reference Range      Not established    VITAMIN D, 25-[place], D2      8 ng/mL      Reference Range      Not established            REPORT COMMENT:      FASTING    Note: An exclamation mark (!) indicates a result that was not dispersed into   the flowsheet.  Document Creation Date: 09/16/2008 8:08 PM  _______________________________________________________________________    (1) Order result status: Final  Collection or observation date-time: 09/13/2008 09:40  Requested date-time:   Receipt date-time: 09/13/2008 09:41  Reported date-time: 09/16/2008 19:00  Referring Physician:    Ordering Physician: Mabry Tift (HOVERMAK)  Specimen Source: S  Source: [name] Order Number: [id number]  Lab site: [facility] [place]      [address]      [place]  [place]  [address]

## 2008-09-17 NOTE — Unmapped (Signed)
Signed by Jonna Clark MA on 09/19/2008 at 11:28:47         Follow-up for Test Results:   Comments: notify chol and hga1c too high, vit d b12 low. stop glucophage and start glucovance. start zocor, start b12 and d recheck 3 mo. the b12 is otc just give her the dose  Follow-up by: Irena Cords MD,  September 17, 2008 5:32 AM    Additional Follow-up for Test Results:   Action Taken: Left patient message on answering machine  Comments: we need pharm num as well  Additional Follow-up by: Jonna Clark MA,  September 17, 2008 2:14 PM    Additional Follow-up for Test Results:   Comments: pharm. #[phone]  Additional Follow-up by: Duanne Moron,  September 18, 2008 8:21 AM    New Medications:  Prescriptions:  B-12 250 MCG TABS (CYANOCOBALAMIN) 1 qd  #30 x 0   Entered and Authorized by: Krishon Adkison Johnn Hai MD   Signed by: Jonna Clark MA on 09/19/2008   Method used: Print then Give to Patient   RxID: [id number]  VITAMIN D 41660 UNIT CAPS (ERGOCALCIFEROL) 1 by mouth every two weeks  #6 x 1   Entered and Authorized by: Kathya Wilz Johnn Hai MD   Signed by: Jonna Clark MA on 09/19/2008   Method used: Print then Give to Patient   RxID: [id number]  ZOCOR 10 MG TABS (SIMVASTATIN) one by mouth every evening  #30 x 3   Entered and Authorized by: Tejal Monroy Johnn Hai MD   Signed by: Jonna Clark MA on 09/19/2008   Method used: Print then Give to Patient   RxID: [id number]  GLUCOVANCE 2.5-500 MG TABS (GLYBURIDE-METFORMIN) one by mouth twice a day  #60 x 3   Entered and Authorized by: Irena Cords MD   Signed by: Jonna Clark MA on 09/19/2008   Method used: Print then Give to Patient   RxID: [id number]  GLUCOVANCE 2.5-500 MG TABS (GLYBURIDE-METFORMIN) one by mouth twice a day  ZOCOR 10 MG TABS (SIMVASTATIN) one by mouth every evening  VITAMIN D 69485 UNIT CAPS (ERGOCALCIFEROL) 1 by mouth every two weeks  B-12 250 MCG TABS (CYANOCOBALAMIN) 1 qd    rx called in need to advise pt of results  and rx  ..................................................................Marland KitchenJonna Clark MA  September 18, 2008 10:03 AM      pt needs to know vit b-12 she can get otc    ..................................................................Marland KitchenJonna Clark MA  September 18, 2008 10:06 AM     advised pt   ..................................................................Marland KitchenJonna Clark MA  September 19, 2008 11:28 AM       Prescriptions:  B-12 250 MCG TABS (CYANOCOBALAMIN) 1 qd  #30 x 0   Entered and Authorized by: Dorse Locy Johnn Hai MD   Signed by: Jonna Clark MA on 09/19/2008   Method used: Print then Give to Patient   RxID: [id number]  VITAMIN D 93716 UNIT CAPS (ERGOCALCIFEROL) 1 by mouth every two weeks  #6 x 1   Entered and Authorized by: Camara Rosander Johnn Hai MD   Signed by: Jonna Clark MA on 09/19/2008   Method used: Print then Give to Patient   RxID: [id number]  ZOCOR 10 MG TABS (SIMVASTATIN) one by mouth every evening  #30 x 3   Entered and Authorized by: Irena Cords MD   Signed by: Jonna Clark MA on 09/19/2008   Method used: Print then Give to Patient  RxID: [id number]  GLUCOVANCE 2.5-500 MG TABS (GLYBURIDE-METFORMIN) one by mouth twice a day  #60 x 3   Entered and Authorized by: Irena Cords MD   Signed by: Jonna Clark MA on 09/19/2008   Method used: Print then Give to Patient   RxID: [id number]

## 2008-10-03 NOTE — Unmapped (Signed)
Signed by Jonna Clark MA on 10/03/2008 at 14:55:41    Prescriptions:  BENICAR HCT 20-12.5 MG TABS (OLMESARTAN MEDOXOMIL-HCTZ) one by mouth daily  #30 x 1   Entered by: Jonna Clark MA   Authorized by: Irena Cords MD   Signed by: Jonna Clark MA on 10/03/2008   Method used: Telephoned to ...     [facility]     [address]     [place], [place]  [address]     Ph: [phone]     Fax: [phone]   RxID: [id number]

## 2008-11-01 NOTE — Unmapped (Signed)
Signed by Nonie Hoyer MA on 11/02/2008 at 14:39:40    PHONE NOTE  Caller's Cell Phone #: [phone]  Caller: patient  Call for: Sharne Linders    Reason for Call: SE from new med : she is tired all of the time and she has muscle anches. this has been going on for one wk  GLUCOVANCE 2.5-500 MG TABS (GLYBURIDE-METFORMIN) one by mouth twice a day          Initial call taken by: Fannie Knee,  November 01, 2008 3:58 PM    Current Allergies:   ! SULFA    New Medications:  GLUCOPHAGE XR 750 MG XR24H-TAB (METFORMIN HCL) 1 qd    FOLLOW UP  stop this and go to glucophage xr 750  Follow-up by:  Irena Cords MD,  November 02, 2008 1:40 PM    FOLLOW UP  [phone] is pharm number  Follow-up by:  Nonie Hoyer MA,  November 02, 2008 2:37 PM    FOLLOW UP  pt was advised. rx's called in.   phone call completed, Rx completed  Follow-up by:  Nonie Hoyer MA,  November 02, 2008 2:38 PM    Prescriptions:  SYNTHROID 75 MCG TABS (LEVOTHYROXINE SODIUM) one by mouth daily  #30 x 5   Entered by: Nonie Hoyer MA   Authorized by: Irena Cords MD   Signed by: Nonie Hoyer MA on 11/02/2008   Method used: Telephoned to ...     [facility]     [address]     [place], [place]  [address]     Ph: [phone]     Fax: [phone]   RxID: [id number]  GLUCOPHAGE XR 750 MG XR24H-TAB (METFORMIN HCL) 1 qd  #30 x 3   Entered and Authorized by: Koji Niehoff Johnn Hai MD   Signed by: Irena Cords MD on 11/02/2008   Method used: Print then Give to Patient   RxID: [id number]

## 2008-11-13 NOTE — Unmapped (Signed)
Signed by Nonie Hoyer MA on 11/14/2008 at 09:17:42    PHONE NOTE  Caller's Cell Phone #: [phone]  Caller: patient  Call for: dr.Tressa Maldonado    Reason for Call: acute illness. patient called and has a sinus infection and some congestion in her chest. feels like she has a fever took about an hour ago and was 100 degrees. has not taken any fever reducer    What have you tried to alleviate the symptoms: tried muccinex  Pharmacy Information: [phone]      Initial call taken by: Fanny Dance MA,  November 13, 2008 2:23 PM    Current Allergies:   ! SULFA    FOLLOW UP  appt 9 if not taken  Follow-up by:  Irena Cords MD,  November 14, 2008 5:21 AM    FOLLOW UP  pt. coming in at 9 am  Follow-up by:  Duanne Moron,  November 14, 2008 8:21 AM    FOLLOW UP  appt scheduled.  phone call completed  Follow-up by:  Nonie Hoyer MA,  November 14, 2008 9:17 AM

## 2008-11-14 NOTE — Unmapped (Signed)
Signed by Irena Cords MD on 11/14/2008 at 09:45:58      Reason for Visit   Chief Complaint: coughing, dizzy, HA,       Allergies  ! SULFA    Medications   SINGULAIR 10 MG TAB (MONTELUKAST SODIUM) one by mouth at bedtime  NEURONTIN 300 MG CAPS (GABAPENTIN) one by mouth tid  FLONASE 50 MCG/ACT SUSPN (FLUTICASONE PROPIONATE (NASAL)) two sprays each nostril at bedtime  ATROVENT 0.03 % SOLN (IPRATROPIUM BROMIDE) 2 sprays bid  VALTREX 500 MG TABS (VALACYCLOVIR HCL) one by mouth dailey  * CALCIUM CITRATE 1 qd  BENICAR HCT 20-12.5 MG TABS (OLMESARTAN MEDOXOMIL-HCTZ) one by mouth daily  GLUCOPHAGE XR 750 MG XR24H-TAB (METFORMIN HCL) 1 qd  SYNTHROID 75 MCG TABS (LEVOTHYROXINE SODIUM) one by mouth daily  * LOVASA 1 GRAM 4 qd  VITAMIN D 96295 UNIT CAPS (ERGOCALCIFEROL) 1 every month  VALTREX 500 MG TABS (VALACYCLOVIR HCL) one by mouth qd  * GLUCOMETER fsbs bid  * LANCETS TEST STRIPS fsbs bid  ZOCOR 10 MG TABS (SIMVASTATIN) one by mouth every evening  VITAMIN D 28413 UNIT CAPS (ERGOCALCIFEROL) 1 by mouth every two weeks  B-12 250 MCG TABS (CYANOCOBALAMIN) 1 qd        Vital Signs:   Ht: 69 in.  Wt: 273 lbs.      BMI: 40.46  BSA: 2.36  Wt chg (lbs): 1  Temperature: 98.5  degrees F  oral  Pulse: 76 (regular)  Resp: 16    Patient appears to be in acute distress: no  BP: 120/80  Cuff size: large    Intake recorded by: Nonie Hoyer MA on November 14, 2008 9:18 AM        HPI Multiple Chronic   Additional Dx: head and chest congestion, pressure. low grade temps.     ROS/Symptoms:   Patient denies any cardiovascular symptoms.  Patient denies any GI symptoms.    PAST HISTORY  Past Medical History (reviewed - no changes required):  Hyperlipidemia, Hypertension, Diabetes Type II, Hypothyroidism, GERD, Stomach polyps, RSD right knee, Anxiety, Mild Asthma, .    Surgical History (reviewed - no changes required):  ACL Repair: x left knee, Meniscal Repair: left knee x 2,  right knee x 1, Peridontal surgery          Physical Examination:      BP: 120/  80    Physical Exam- Detail:   General Appearance: well-developed, well-nourished and in no acute distress.  Ears: effusion  Oropharynx: erythema  Respiratory: Respiration un-labored.  Lung fields clear to auscultation.  No wheezing, rales, rhonchi or pleural rub.  Cardiac: S1 and S2 normal.  RRR without murmurs, rubs, gallops.  No JVD.  Vascular: No carotid bruits.  No edema or varicosities.           New Problems:  SINUSITIS, ACUTE (ICD-461.9)  New Medications:  TUSSIONEX PENNKINETIC ER 8-10 MG/5ML CR LIQ (CHLORPHENIRAMINE-HYDROCODONE) one teaspoon by mouth every twelve hours as needed for cough  *        Preventive Maintenance       Coordinating Care Providers   PCP Name: Dr. Gari Crown             Prescriptions:  ATROVENT 0.03 % SOLN (IPRATROPIUM BROMIDE) 2 sprays bid  #1 x 5   Entered and Authorized by: Eber Ferrufino Johnn Hai MD   Signed by: Irena Cords MD on 11/14/2008   Method used: Print then Give to Patient   RxID:  [id number]  AUGMENTIN 875-125 MG TABS (AMOXICILLIN-POT CLAVULANATE) one by mouth twice daily  #20 x 0   Entered and Authorized by: Kayron Kalmar Johnn Hai MD   Signed by: Irena Cords MD on 11/14/2008   Method used: Print then Give to Patient   RxID: [id number]  [facility] ER 8-10 MG/5ML CR LIQ (CHLORPHENIRAMINE-HYDROCODONE) one teaspoon by mouth every twelve hours as needed for cough  #4 oz x 0   Entered and Authorized by: Tsuneo Faison Johnn Hai MD   Signed by: Irena Cords MD on 11/14/2008   Method used: Print then Give to Patient   RxID: [id number]      Assessment and Plan     Problems   Status of Existing Problems:  Assessed SINUSITIS, ACUTE as new - augmentin, tussionex - Darral Rishel K Kyrin Gratz MD  New Problems:  Dx of SINUSITIS, ACUTE (ICD-461.9)  Onset: 11/14/2008    Medications   New Prescriptions/Refills:  ATROVENT 0.03 % SOLN (IPRATROPIUM BROMIDE) 2 sprays bid  #1 x 5, 11/14/2008, Akane Tessier K Deryk Bozman MD  AUGMENTIN 875-125 MG TABS (AMOXICILLIN-POT CLAVULANATE) one by  mouth twice daily  #20 x 0, 11/14/2008, Jensine Luz K Jaleisa Brose MD  TUSSIONEX PENNKINETIC ER 8-10 MG/5ML CR LIQ (CHLORPHENIRAMINE-HYDROCODONE) one teaspoon by mouth every twelve hours as needed for cough  #4 oz x 0, 11/14/2008, Cannie Muckle K Morgan Rennert MD    Today's Orders   99213 - Ofc Vst, Est Level III [[id number]]

## 2008-11-19 NOTE — Unmapped (Signed)
Signed by Nonie Hoyer MA on 11/19/2008 at 17:12:06    PHONE NOTE  Caller's Cell Phone #: [phone]  Caller: patient  Department: IM - General  Call for: Dr.Rhondalyn Clingan    Reason for Call: PT was in last week for sinus/earache and was given antibiotics. She was feeling well the first few days, and now is feeling back to where she was ( sinus clogged, cough, phlegm) Still has 4 days left of antibiotic, but wondering if she should be taking something else. please advise.     Pharmacy Information: [phone]      Initial call taken by: Rogers Blocker,  November 19, 2008 3:00 PM      New Medications:  BIAXIN 500 MG TABS (CLARITHROMYCIN) one by mouth twice a day    FOLLOW UP  can switch to biaxin  Follow-up by:  Irena Cords MD,  November 19, 2008 5:03 PM    FOLLOW UP  rx was called into the pharmacy.   patient advised, phone call completed, Rx completed  Follow-up by:  Nonie Hoyer MA,  November 19, 2008 5:11 PM    Prescriptions:  BIAXIN 500 MG TABS (CLARITHROMYCIN) one by mouth twice a day  #20 x 0   Entered and Authorized by: Richad Ramsay Johnn Hai MD   Signed by: Irena Cords MD on 11/19/2008   Method used: Print then Give to Patient   RxID: [id number]

## 2008-12-07 NOTE — Unmapped (Signed)
Signed by Nonie Hoyer MA on 12/07/2008 at 10:17:55    Prescriptions:  LEXAPRO 10 MG TABS (ESCITALOPRAM OXALATE) one by mouth daily  #30 x 5   Entered by: Nonie Hoyer MA   Authorized by: Irena Cords MD   Signed by: Nonie Hoyer MA on 12/07/2008   Method used: Telephoned to ...     [facility] [place]     [address]     [place], [place]  [address]     Ph: [phone]     Fax: [phone]   RxID: [id number]

## 2008-12-11 NOTE — Unmapped (Signed)
Signed by Fannie Knee on 12/13/2008 at 07:59:20    PHONE NOTE  Call back at Home Phone: [phone]  Caller: patient  Department: IM - General  Call for: Sharanya Templin    Reason for Call: pt. needs lab orders for dec. appt.      Initial call taken by: Duanne Moron,  December 11, 2008 2:56 PM    Current Allergies:   ! SULFA    New Orders:  CMP   (METAPNL) (10231) [CPT-80053]  Lipid Profile   (FATS) (7600) [CPT-80061]  B-12  (B12)  (927) [*CPT-82607]  Folic Acid  (FOLATE) (466) [ZSW-10932]  TSH   (TSH) (899) [CPT-84443]  Hemoglobin  A1C   (GLYCO) (496)  [CPT-83036]  Vitamin D, 25-Hydrodroxy  (VITMD25) (17306) [CPT-82306]  Diabetic Education, Individual [UCP-11111]  FOLLOW UP  [facility]  [facility] this    fax [phone]  Follow-up by:  Duanne Moron,  December 11, 2008 3:11 PM    FOLLOW UP  orders ready and   DM edu faxed.  Follow-up by:  Fannie Knee,  December 13, 2008 7:58 AM

## 2009-01-11 LAB — COMPREHENSIVE METABOLIC PANEL
A/G Ratio: 1.8 (ref 1.0–2.1)
ALT: 91 units/L (ref 6–40)
AST: 82 units/L (ref 10–35)
Albumin: 4.6 g/dL (ref 3.6–5.1)
Alkaline Phosphatase: 82 units/L (ref 33–115)
BUN: 17 mg/dL (ref 7–25)
CO2: 24 mmol/L (ref 21–33)
Calcium: 10 mg/dL (ref 8.6–10.2)
Chloride: 102 mmol/L (ref 98–110)
Creatinine: 0.61 mg/dL (ref 0.59–1.07)
GFR MDRD Af Amer: >60 mL/min (ref >=60)
GFR MDRD Non Af Amer: >60 mL/min (ref >=60)
Globulin, Total: 2.6 g/dL (ref 2.2–3.9)
Glucose: 224 mg/dL (ref 65–99)
Potassium: 4.1 mmol/L (ref 3.5–5.3)
Sodium: 138 mmol/L (ref 135–146)
Total Bilirubin: 0.8 mg/dL (ref 0.2–1.2)
Total Protein: 7.2 g/dL (ref 6.2–8.3)

## 2009-01-11 LAB — FOLATE: Folic Acid: 18.1 ng/mL

## 2009-01-11 LAB — HEMOGLOBIN A1C: Hemoglobin A1C: 8.8 % (ref <5.7)

## 2009-01-11 LAB — VITAMIN D 25 HYDROXY
Vit D, 25-Hydroxy: 41 ng/mL (ref 30–100)
Vitamin D2, 1,25 (~~LOC~~)2: 21 ng/mL
Vitamin D3, 1,25 (~~LOC~~)2: 20 ng/mL

## 2009-01-11 LAB — VITAMIN B12: Vitamin B-12: 914 pg/mL (ref 200–1100)

## 2009-01-11 LAB — TSH: TSH: 2.03 microintl units/mL

## 2009-01-11 LAB — LIPID PANEL
Chol/HDL Ratio: 3.6 (ref <=5.0)
Cholesterol, Total: 171 mg/dL (ref 125–200)
HDL: 48 mg/dL (ref >=46)
LDL Cholesterol: 84 mg/dL (ref <130)
Triglycerides: 194 mg/dL (ref <150)

## 2009-01-11 NOTE — Unmapped (Signed)
Signed by Irena Cords MD on 01/14/2009 at 11:13:33  Patient: Wanda Rowe  Note: All result statuses are Final unless otherwise noted.    Tests: (1) HEMOGLOBIN A1c (QDL-496)   HEMOGLOBIN A1c (% of total Hgb)                         [H]  8.8 %                       <5.7                      Consistent with diabetes                       <5.7       Decreased risk of diabetes                       5.7-6.0    Increased risk of diabetes                       6.1-6.4    Higher risk of diabetes                       > or = 6.5 Consistent with diabetes                         Standards of Medical Care in Diabetes-2010.                  Diabetes Care, 33(Supp 1): A5-W09,8119.            REPORT COMMENT:      FASTING    Note: An exclamation mark (!) indicates a result that was not dispersed into   the flowsheet.  Document Creation Date: 01/11/2009 8:44 PM  _______________________________________________________________________    (1) Order result status: Final  Collection or observation date-time: 01/11/2009 08:33  Requested date-time:   Receipt date-time: 01/11/2009 08:35  Reported date-time: 01/11/2009 20:00  Referring Physician:    Ordering Physician: Loisann Roach (HOVERMAK)  Specimen Source: B  Source: [name] Order Number: [id number]  Lab site: [facility] [place]      [address]      [place]  [place]  [address]

## 2009-01-11 NOTE — Unmapped (Signed)
Signed by Irena Cords MD on 01/14/2009 at 11:13:33  Patient: Wanda Rowe  Note: All result statuses are Final unless otherwise noted.    Tests: (1) LIPID PANEL (QDL-7600)    CHOLESTEROL, TOTAL        171 mg/dL                   161-096    HDL CHOLESTEROL           48 mg/dL                    > OR = 46    TRIGLYCERIDES        [H]  194 mg/dL                   <045   LDL-CHOLESTEROL (calc)                              84 mg/dL                    <409             Desirable range <100 mg/dL for patients with CHD or      diabetes and <70 mg/dL for diabetic patients with      known heart disease.          CHOL/HDLC RATIO (calc)                              3.6                         < OR = 5.0    Note: An exclamation mark (!) indicates a result that was not dispersed into   the flowsheet.  Document Creation Date: 01/11/2009 9:26 PM  _______________________________________________________________________    (1) Order result status: Final  Collection or observation date-time: 01/11/2009 08:33  Requested date-time:   Receipt date-time: 01/11/2009 08:35  Reported date-time: 01/11/2009 21:00  Referring Physician:    Ordering Physician: Demetria Iwai (HOVERMAK)  Specimen Source: S  Source: [name] Order Number: [id number]  Lab site: [facility] [place]      [address]      [place]  [place]  [address]

## 2009-01-11 NOTE — Unmapped (Signed)
Signed by Irena Cords MD on 01/14/2009 at 11:13:33  Patient: Wanda Rowe Wehner  Note: All result statuses are Final unless otherwise noted.    Tests: (1) COMPREHENSIVE METABOLIC PANEL W/eGFR (QDL-10231)    GLUCOSE              [H]  224 mg/dL                   53-66                        Fasting reference interval           UREA NITROGEN (BUN)       17 mg/dL                    4-40    CREATININE                0.61 mg/dL                  0.59-1.07   eGFR NON-AFR. AMERICAN                              >60 mL/min/1.7m2           > OR = 60   eGFR AFRICAN AMERICAN                              >60 mL/min/1.107m2           > OR = 60   BUN/CREATININE RATIO (calc)                              NOT APPLICABLE              6-22    SODIUM                    138 mmol/L                  135-146    POTASSIUM                 4.1 mmol/L                  3.5-5.3    CHLORIDE                  102 mmol/L                  98-110    CARBON DIOXIDE            24 mmol/L                   21-33    CALCIUM                   10.0 mg/dL                  3.4-74.2    PROTEIN, TOTAL            7.2 g/dL                    5.9-5.6    ALBUMIN                   4.6 g/dL  3.6-5.1    GLOBULIN (calc)           2.6 g/dL                    1.9-1.4   ALBUMIN/GLOBULIN RATIO (calc)                              1.8                         1.0-2.1    BILIRUBIN, TOTAL          0.8 mg/dL                   7.8-2.9    ALKALINE PHOSPHATASE      82 U/L                      33-115    AST                  [H]  82 U/L                      10-35    ALT                  [H]  91 U/L                      6-40            REPORT COMMENT:      FASTING    Note: An exclamation mark (!) indicates a result that was not dispersed into   the flowsheet.  Document Creation Date: 01/11/2009 9:26 PM  _______________________________________________________________________    (1) Order result status: Final  Collection or observation date-time: 01/11/2009 08:33  Requested  date-time:   Receipt date-time: 01/11/2009 08:35  Reported date-time: 01/11/2009 21:00  Referring Physician:    Ordering Physician: Dexter Signor (HOVERMAK)  Specimen Source: S  Source: [name] Order Number: [id number]  Lab site: [facility] [place]      [address]      [place]  [facility]  [address]      -----------------    The following non-numeric lab results were dispersed to  the flowsheet even though numeric results were expected:      BUN/CREATININE RATIO (calc), NOT APPLICABLE

## 2009-01-11 NOTE — Unmapped (Signed)
Signed by Irena Cords MD on 01/14/2009 at 11:13:33  Patient: Wanda Rowe  Note: All result statuses are Final unless otherwise noted.    Tests: (1) TSH, 3RD GENERATION (QDL-899)    TSH, 3RD GENERATION       2.03 mIU/L      Reference Range              > or = 20 Years  0.40-4.50                   Pregnancy Ranges       First trimester    0.20-4.70       Second trimester   0.30-4.10       Third trimester    0.40-2.70            REPORT COMMENT:      FASTING    Note: An exclamation mark (!) indicates a result that was not dispersed into   the flowsheet.  Document Creation Date: 01/12/2009 1:29 AM  _______________________________________________________________________    (1) Order result status: Final  Collection or observation date-time: 01/11/2009 08:33  Requested date-time:   Receipt date-time: 01/11/2009 08:35  Reported date-time: 01/12/2009 01:00  Referring Physician:    Ordering Physician: Chabely Norby (HOVERMAK)  Specimen Source: S  Source: [name] Order Number: [id number] [id number]  Lab site: [facility] [place]      [address]      [place]  [place]  [address]

## 2009-01-11 NOTE — Unmapped (Signed)
Signed by Irena Cords MD on 01/14/2009 at 11:13:33  Patient: Wanda Rowe Docter  Note: All result statuses are Final unless otherwise noted.    Tests: (1) FOLATE, SERUM (QDL-466)    FOLATE, SERUM             18.1 ng/mL                                 Reference Range                                 Low:           <3.4                                 Borderline:    3.4-5.4                                 Normal:        >5.4           Note: An exclamation mark (!) indicates a result that was not dispersed into   the flowsheet.  Document Creation Date: 01/12/2009 12:00 PM  _______________________________________________________________________    (1) Order result status: Final  Collection or observation date-time: 01/11/2009 08:33  Requested date-time:   Receipt date-time: 01/11/2009 08:35  Reported date-time: 01/12/2009 11:00  Referring Physician:    Ordering Physician: Jonah Nestle (HOVERMAK)  Specimen Source: S  Source: [name] Order Number: [id number] [id number]  Lab site: [facility] [place]      [address]      [place]  [place]  [address]

## 2009-01-11 NOTE — Unmapped (Signed)
Signed by Irena Cords MD on 01/14/2009 at 11:13:33  Patient: Wanda Rowe  Note: All result statuses are Final unless otherwise noted.    Tests: (1) VITAMIN B12 (QDL-927)    VITAMIN B12               914 pg/mL                   [id number]            REPORT COMMENT:      FASTING    Note: An exclamation mark (!) indicates a result that was not dispersed into   the flowsheet.  Document Creation Date: 01/12/2009 12:00 PM  _______________________________________________________________________    (1) Order result status: Final  Collection or observation date-time: 01/11/2009 08:33  Requested date-time:   Receipt date-time: 01/11/2009 08:35  Reported date-time: 01/12/2009 11:00  Referring Physician:    Ordering Physician: Marquesa Rath (HOVERMAK)  Specimen Source: S  Source: [name] Order Number: [id number] [id number]  Lab site: [facility] [place]      [address]      [place]  [place]  [address]

## 2009-01-11 NOTE — Unmapped (Signed)
Signed by Irena Cords MD on 01/16/2009 at 06:26:30  Patient: Wanda Rowe  Note: All result statuses are Final unless otherwise noted.    Tests: (1) VITAMIN D, 25-HYDROXY, LC/MS/MS (UJW-11914)   VITAMIN D, 25-Bradford, TOTAL                              41 ng/mL                    30-100             25-OHD3 indicates both endogenous production and      supplementation. 25-OHD2 is an indicator of exogenous      sources, such as diet or supplementation. Therapy is      based on measurement of Total 25-OHD, with levels       <20 ng/mL indicative of Vitamin D deficiency, while       levels between 20 ng/mL and 30 ng/mL suggest      insufficiency. Optimal levels are > or = 30 ng/mL.    VITAMIN D, 25-Angelina, D3      20 ng/mL      Reference Range      Not established    VITAMIN D, 25-, D2      21 ng/mL      Reference Range      Not established            REPORT COMMENT:      FASTING    Note: An exclamation mark (!) indicates a result that was not dispersed into   the flowsheet.  Document Creation Date: 01/15/2009 8:29 PM  _______________________________________________________________________    (1) Order result status: Final  Collection or observation date-time: 01/11/2009 08:33  Requested date-time:   Receipt date-time: 01/11/2009 08:35  Reported date-time: 01/15/2009 20:00  Referring Physician:    Ordering Physician: Abed Schar (HOVERMAK)  Specimen Source: S  Source: [name] Order Number: [id number]  Lab site: [facility] [place]      [address]      [place]  [place]  [address]

## 2009-01-21 NOTE — Unmapped (Signed)
Signed by Irena Cords MD on 01/21/2009 at 12:41:36      Reason for Visit   Chief Complaint: follow up on labs      Allergies  ! SULFA    Medications   SINGULAIR 10 MG TAB (MONTELUKAST SODIUM) one by mouth at bedtime  NEURONTIN 300 MG CAPS (GABAPENTIN) one by mouth tid  FLONASE 50 MCG/ACT SUSPN (FLUTICASONE PROPIONATE (NASAL)) two sprays each nostril at bedtime  ATROVENT 0.03 % SOLN (IPRATROPIUM BROMIDE) 2 sprays bid  VALTREX 500 MG TABS (VALACYCLOVIR HCL) one by mouth dailey  * CALCIUM CITRATE 1 qd  BENICAR HCT 20-12.5 MG TABS (OLMESARTAN MEDOXOMIL-HCTZ) one by mouth daily  GLUCOPHAGE XR 750 MG XR24H-TAB (METFORMIN HCL) 1 qd  SYNTHROID 75 MCG TABS (LEVOTHYROXINE SODIUM) one by mouth daily  * LOVASA 1 GRAM 4 qd  VITAMIN D 60454 UNIT CAPS (ERGOCALCIFEROL) 1 every month  VALTREX 500 MG TABS (VALACYCLOVIR HCL) one by mouth qd  * GLUCOMETER fsbs bid  * LANCETS TEST STRIPS fsbs bid  ZOCOR 10 MG TABS (SIMVASTATIN) one by mouth every evening  VITAMIN D 09811 UNIT CAPS (ERGOCALCIFEROL) 1 by mouth every two weeks  B-12 250 MCG TABS (CYANOCOBALAMIN) 1 qd  TUSSIONEX PENNKINETIC ER 8-10 MG/5ML CR LIQ (CHLORPHENIRAMINE-HYDROCODONE) one teaspoon by mouth every twelve hours as needed for cough  BIAXIN 500 MG TABS (CLARITHROMYCIN) one by mouth twice a day  LEXAPRO 10 MG TABS (ESCITALOPRAM OXALATE) one by mouth daily       Vital Signs:   Wt: 269 lbs.      BMI: 39.87  BSA: 2.35  Wt chg (lbs): -4  Pulse: 76 (regular)    Patient appears to be in acute distress: no  BP: 126/80  Cuff size: regular    Intake recorded by: Fanny Dance MA on January 21, 2009 11:47 AM        HPI Multiple Chronic   Additional Dx: working on diet and exercise. bp good. bs have been up.    ROS/Symptoms:   Patient denies any cardiovascular symptoms.  Patient denies any respiratory symptoms.  Patient denies any GI symptoms.  Patient denies any neurologic symptoms.  Patient denies any musculoskeletal symptoms.    PAST HISTORY  Past Medical History (reviewed -  no changes required):  Hyperlipidemia, Hypertension, Diabetes Type II, Hypothyroidism, GERD, Stomach polyps, RSD right knee, Anxiety, Mild Asthma, .    Surgical History (reviewed - no changes required):  ACL Repair: x left knee, Meniscal Repair: left knee x 2,  right knee x 1, Peridontal surgery    Family History (reviewed - no changes required): Mother - HTN, Degenerative Disc Disease  Father - DMII, HTN, Factor VIII inhibitor  Brother - Back Pain  MGM - DMII, Bladder CA, CVA  MGF - Colon CA  PGM - Breast CA        Physical Examination:   BP: 126/  80    Physical Exam- Detail:   General Appearance: well-developed, well-nourished and in no acute distress.  Skin: No suspicious rashes or lesions.  Respiratory: Respiration un-labored.  Lung fields clear to auscultation.  No wheezing, rales, rhonchi or pleural rub.  Cardiac: S1 and S2 normal.  RRR without murmurs, rubs, gallops.  No JVD.  Vascular: No carotid bruits.  No edema or varicosities.  Diabetic Foot Check:   Skin integrity intact.  No splitting, ulcers or calluses.  Abdomen: No masses or tenderness. Bowel sounds active x4 quad.  Liver and spleen are without tenderness or enlargement.  No hernias.  Psychiatric: Judgement and insight are within normal limits.  Alert and oriented x3.  No mood disorders noted, appropriate affect.           New Problems:  TRANSAMINASES, SERUM, ELEVATED (ICD-790.4)  New Medications:  SYNTHROID 75 MCG TABS (LEVOTHYROXINE SODIUM) one by mouth daily  VITAMIN D 40102 UNIT CAPS (ERGOCALCIFEROL) one by mouth every week  JANUMET 50-500 MG TABS (SITAGLIPTIN-METFORMIN HCL) 1 bid  * LANCETS, STRIPS FOR GLUCOMETER fsbs bid  ZITHROMAX 250 MG TABS (AZITHROMYCIN) two by mouth now, then one by mouth daily for four additional days      Preventive Maintenance       Coordinating Care Providers   PCP Name: Dr. Gari Crown             Prescriptions:  ZITHROMAX 250 MG TABS (AZITHROMYCIN) two by mouth now, then one by mouth daily for four additional  days  #6 x 0   Entered and Authorized by: Sanyiah Kanzler Johnn Hai MD   Signed by: Irena Cords MD on 01/21/2009   Method used: Print then Give to Patient   RxID: [id number]  VITAMIN D 56387 UNIT CAPS (ERGOCALCIFEROL) one by mouth every week  #4 x 5   Entered and Authorized by: Ruweyda Macknight Johnn Hai MD   Signed by: Irena Cords MD on 01/21/2009   Method used: Print then Give to Patient   RxID: [id number]  LANCETS, STRIPS FOR GLUCOMETER fsbs bid  #100 x 3   Entered and Authorized by: Bryauna Byrum Johnn Hai MD   Signed by: Irena Cords MD on 01/21/2009   Method used: Print then Give to Patient   RxID: [id number]  VITAMIN D 22025 UNIT CAPS (ERGOCALCIFEROL) 1 every month  #4 x 5   Entered and Authorized by: Zaleah Ternes Johnn Hai MD   Signed by: Irena Cords MD on 01/21/2009   Method used: Print then Give to Patient   RxID: [id number]  LOVASA 1 GRAM 4 qd  #120 x 5   Entered and Authorized by: Florita Nitsch Johnn Hai MD   Signed by: Irena Cords MD on 01/21/2009   Method used: Print then Give to Patient   RxID: [id number]  BENICAR HCT 20-12.5 MG TABS (OLMESARTAN MEDOXOMIL-HCTZ) one by mouth daily  #30 x 5   Entered and Authorized by: Everardo Voris Johnn Hai MD   Signed by: Irena Cords MD on 01/21/2009   Method used: Print then Give to Patient   RxID: [id number]  VALTREX 500 MG TABS (VALACYCLOVIR HCL) one by mouth dailey  #30 x 5   Entered and Authorized by: Kateria Cutrona Johnn Hai MD   Signed by: Irena Cords MD on 01/21/2009   Method used: Print then Give to Patient   RxID: [id number]  ATROVENT 0.03 % SOLN (IPRATROPIUM BROMIDE) 2 sprays bid  #1 x 5   Entered and Authorized by: Magdalena Skilton Johnn Hai MD   Signed by: Irena Cords MD on 01/21/2009   Method used: Print then Give to Patient   RxID: [id number]  FLONASE 50 MCG/ACT SUSPN (FLUTICASONE PROPIONATE (NASAL)) two sprays each nostril at bedtime  #1 x 5   Entered and Authorized by: Macauley Mossberg Johnn Hai MD   Signed by: Irena Cords MD on 01/21/2009   Method  used: Print then Give to Patient   RxID: [id number]  NEURONTIN 300 MG CAPS (GABAPENTIN) one by mouth tid  #90 x 5   Entered and Authorized by: Cristobal Advani Johnn Hai MD  Signed by: Irena Cords MD on 01/21/2009   Method used: Print then Give to Patient   RxID: [id number]  SINGULAIR 10 MG TAB (MONTELUKAST SODIUM) one by mouth at bedtime  #30 x 5   Entered and Authorized by: Zeno Hickel Johnn Hai MD   Signed by: Irena Cords MD on 01/21/2009   Method used: Print then Give to Patient   RxID: [id number]  JANUMET 50-500 MG TABS (SITAGLIPTIN-METFORMIN HCL) 1 bid  #60 x 3   Entered and Authorized by: Clark Clowdus Johnn Hai MD   Signed by: Irena Cords MD on 01/21/2009   Method used: Print then Give to Patient   RxID: [id number]  SYNTHROID 75 MCG TABS (LEVOTHYROXINE SODIUM) one by mouth daily  #30 x 3   Entered and Authorized by: Amyiah Gaba Johnn Hai MD   Signed by: Irena Cords MD on 01/21/2009   Method used: Print then Give to Patient   RxID: [id number]      Assessment and Plan     Problems   Status of Existing Problems:  Assessed TRANSAMINASES, SERUM, ELEVATED as deteriorated - check [place] stop zocor - Warren Lindahl K Abeni Finchum MD  Assessed SINUSITIS, ACUTE as deteriorated - zpak - Quetzal Meany K Taariq Leitz MD  Assessed VITAMIN D DEFICIENCY as improved - continue current medications - Handsome Anglin K Takota Cahalan MD  Assessed HYPOTHYROIDISM as improved - continue current medications - Krisa Blattner K Niomi Valent MD  Assessed ASTHMA as improved - continue current medications - Aliannah Holstrom K Silus Lanzo MD  Assessed DIABETES MELLITUS, TYPE II as improved - continue current medications - Diala Waxman K Sherisse Fullilove MD  Assessed HYPERTRIGLYCERIDEMIA as improved - continue current medications - Avanna Sowder K Shalyn Koral MD  Assessed HYPERTENSION as improved - continue current medications - Jahmil Macleod K Monserrate Blaschke MD  Assessed HYPERCHOLESTEROLEMIA as improved - have to stop zocor work on diet - Mosella Kasa Johnn Hai MD  New Problems:  Dx of TRANSAMINASES, SERUM, ELEVATED (ICD-790.4)  Onset:  01/21/2009    Medications   New Prescriptions/Refills:  ZITHROMAX 250 MG TABS (AZITHROMYCIN) two by mouth now, then one by mouth daily for four additional days  #6 x 0, 01/21/2009, Lurine Imel K Kealie Barrie MD  VITAMIN D 42595 UNIT CAPS (ERGOCALCIFEROL) one by mouth every week  #4 x 5, 01/21/2009, Lavena Loretto K Harrison Paulson MD  LANCETS, STRIPS FOR GLUCOMETER fsbs bid  #100 x 3, 01/21/2009, Yulia Ulrich K Cordaryl Decelles MD  VITAMIN D 63875 UNIT CAPS (ERGOCALCIFEROL) 1 every month  #4 x 5, 01/21/2009, Elyanah Farino K Lunabella Badgett MD  LOVASA 1 GRAM 4 qd  #120 x 5, 01/21/2009, Torres Hardenbrook K Vandell Kun MD  BENICAR HCT 20-12.5 MG TABS (OLMESARTAN MEDOXOMIL-HCTZ) one by mouth daily  #30 x 5, 01/21/2009, Siddalee Vanderheiden K Sheritha Louis MD  VALTREX 500 MG TABS (VALACYCLOVIR HCL) one by mouth dailey  #30 x 5, 01/21/2009, Laquinn Shippy K Franke Menter MD  ATROVENT 0.03 % SOLN (IPRATROPIUM BROMIDE) 2 sprays bid  #1 x 5, 01/21/2009, Shanaya Schneck K Daemyn Gariepy MD  FLONASE 50 MCG/ACT SUSPN (FLUTICASONE PROPIONATE (NASAL)) two sprays each nostril at bedtime  #1 x 5, 01/21/2009, Theresea Trautmann K Jordan Pardini MD  NEURONTIN 300 MG CAPS (GABAPENTIN) one by mouth tid  #90 x 5, 01/21/2009, Jalayia Bagheri K Celestino Ackerman MD  SINGULAIR 10 MG TAB (MONTELUKAST SODIUM) one by mouth at bedtime  #30 x 5, 01/21/2009, Everard Interrante K Corderius Saraceni MD  JANUMET 50-500 MG TABS (SITAGLIPTIN-METFORMIN HCL) 1 bid  #60 x 3, 01/21/2009, Meghann Landing K Orie Cuttino MD  SYNTHROID 75 MCG TABS (LEVOTHYROXINE SODIUM) one by mouth daily  #30 x 3, 01/21/2009, Tonesha Tsou K  Tige Meas MD    Today's Orders   Abdomen US [CPT-76700]  CMP   (METAPNL) (10231) [CPT-80053]  Lipid Profile   (FATS) (7600) [CPT-80061]  Hemoglobin  A1C   (GLYCO) (496)  [CPT-83036]  TSH   (TSH) (899) [CPT-84443]  B-12  (B12)  (927) [*CPT-82607]  Folic Acid  (FOLATE) (466) [GEX-52841]  HgA1C  -  >= 12mos. care  7 to 9 % [CPT-3045F]  LDL  -  >= 12mos. care  < 100 [CPT-3048F]  BP Systolic  -  >= 12mos. care  < 130 [CPT-3074F]  BP Diastolic  -  >= 12mos. care  80-89 [CPT-3079F]  99214 - Ofc Vst, Est Level IV [LKG-40102]

## 2009-01-29 ENCOUNTER — Inpatient Hospital Stay

## 2009-01-29 NOTE — Unmapped (Signed)
Signed by   LinkLogic on 01/29/2009 at 17:02:57  Patient: Wanda Rowe  Note: All result statuses are Final unless otherwise noted.    Tests: (1) US-ABDOMEN COMPLETE ([id number])    Order Note: [facility] Accession Number: [id number]    Order Note:                                 Dinah Beers     Patient: [name], [name]   DOB:     November 17, 1960   MRN:     [id number]   FIN:       [id number]   Accn#:   [id number]                                       Ultrasound     Exam                       Exam Date/Time       Ordering Physician   US-ABDOMEN COMPLETE        01/29/2009 08:33 EST Jeanne Terrance K     Reason for Exam   elevated lfts     Report   Abdominal ultrasound dated 01/29/2009.     Comparison: None     Indication: Elevated LFTs     Findings:     The liver is echogenic without evidence of intrahepatic biliary ductal   dilatation. No focal hepatic masses are seen. The common bile duct is   normal in caliber measuring approximately 2 mm. No gallstones are seen.   Gallbladder wall thickness is normal.  The pancreas appears normal. The   spleen is normal in size measuring 12.1 cm in craniocaudal dimension. No   free fluid is seen in the abdomen.     Limited evaluation of the kidneys demonstrates normal size and   echogenicity.  There is no hydronephrosis.  The right kidney measures 13.8   cm. The left kidney measures 11.8 cm in length.     No gross abnormalities are seen in the limited evaluation of the aorta and   IVC. Portal flow is antegrade.     Impression:     Hepatic steatosis without sonographic evidence of focal mass.   ********** VERIFIED REPORT **********     Dictated: 01/29/2009 9:15 am      DELK M.D., Maryagnes Amos   Signed (Electronic Signature):  [facility] M.D., Leonia Reader             01/29/09   5:03   Resident:  Breck Coons M.D., Maryagnes Amos     Technologist: [facility]    Order Note:   EMR Routing to: Waldine Zenz K - ordering - [id number]    Note: An exclamation mark (!) indicates a result that was not dispersed into   the  flowsheet.  Document Creation Date: 01/29/2009 5:02 PM  _______________________________________________________________________    (1) Order result status: Final  Collection or observation date-time: 01/29/2009 08:33:13  Requested date-time: 01/29/2009 08:00:00  Receipt date-time:   Reported date-time: 01/29/2009 17:03:36  Referring Physician: Monea Pesantez  Ordering Physician: Lyah Millirons (HOVERMAK)  Specimen Source: RAD&Rad Type  Source: QRS  Filler Order Number: [id number]  Lab site: [facility]

## 2009-01-31 NOTE — Unmapped (Signed)
Signed by Fanny Dance MA on 01/31/2009 at 09:48:30         Follow-up for Test Results:   Comments: notify us shows fatty liver, please work on lowfat/low carb diet and wt loss  Follow-up by: Irena Cords MD,  January 31, 2009 5:51 AM    Additional Follow-up for Test Results:   Action Taken: Left patient message on answering machine  Additional Follow-up by: Fanny Dance MA,  January 31, 2009 9:48 AM

## 2009-02-11 NOTE — Unmapped (Signed)
Signed by Shirlean Mylar on 02/11/2009 at 10:59:20    Prescriptions:  VITAMIN D 86578 UNIT CAPS (ERGOCALCIFEROL) 1 every month  #6 x 1   Entered by: Shirlean Mylar   Authorized by: Irena Cords MD   Signed by: Shirlean Mylar on 02/11/2009   Method used: Telephoned to ...     [facility]     [address]     [place], [place]  [address]     Ph: [phone]     Fax: [phone]   RxID: [id number]

## 2009-04-08 NOTE — Unmapped (Signed)
Signed by   LinkLogic on 04/08/2009 at 14:57:21  Patient: Wanda Rowe  Note: All result statuses are Final unless otherwise noted.    Tests: (1)  (MR)    Order Note:                                         [facility]     PATIENT NAMECHRISMA, [name].                   MR #:  [id number]  DATE OF BIRTH:  1960-02-14                        ACCOUNT #:  [id number]  ED PHYSICIAN:   Cherly Anderson, M.D.                 ROOM #:  PRIMARY:        Kenneth K. Hovermale, M.D.            NURSING UNIT:  ED  REFERRING:                                        FC:  C  DICTATED BY:    Cherly Anderson, M.D.                 ADMIT DATE:  04/08/2009  VISIT DATE:                                       DISCHARGE DATE:                               EMERGENCY DEPARTMENT NOTE     *-*-*     The patient was initially seen and evaluated by our emergency medicine nurse  practitioner Wayne Sever.  I have personally seen and examined the  patient, discussed treatment plan and evaluation with the emergency medicine  nurse practitioner.  I agree with the treatment plan and evaluation.  I have  no further addendums at this time.        *-*-*                                              _______________________________________  AW/nb                                  _____  D:  04/08/2009 11:41                  Cherly Anderson, M.D.  T:  04/08/2009 14:45  Job #:  [id number]                                      EMERGENCY DEPARTMENT NOTE  PAGE    1 of   1    Note: An exclamation mark (!) indicates a result that was not dispersed into   the flowsheet.  Document Creation Date: 04/08/2009 2:57 PM  _______________________________________________________________________    (1) Order result status: Final  Collection or observation date-time: 04/08/2009 00:00  Requested date-time:   Receipt date-time:   Reported date-time:   Referring Physician: Gari Crown  Ordering Physician:  Reviewed In Hospital  [facility])  Specimen Source:   Source: DBS  Filler Order Number: [id number]  Lab site:

## 2009-04-08 NOTE — Unmapped (Signed)
Signed by Vance Peper on 04/08/2009 at 15:27:33    PHONE NOTE  Caller's Cell Phone #: [phone]  Caller: patient  Department: IM - General  Call for: [facility]    Reason for Call: speak with nurse, speak with provider. I worked patient in on 04/11/09 at 1:30pm- she stated she should be seen sooner- is this appt okay or work in on 04/10/09      Initial call taken by: Shirlean Mylar,  April 08, 2009 3:24 PM    Current Allergies:   ! SULFA    FOLLOW UP  LM letting pt know that the 10th is the earliest we have and she needs to keep that appt.  left message to call back, phone call completed  Follow-up by:  Vance Peper,  April 08, 2009 3:27 PM

## 2009-04-08 NOTE — Unmapped (Signed)
Signed by   LinkLogic on 04/08/2009 at 11:58:45  Patient: Wanda Rowe  Note: All result statuses are Final unless otherwise noted.    Tests: (1) DIAG-PORTABLE CHEST [phone])    Order Note: [facility] Accession Number: [id number]    Order Note:                               [facility]     Patient: Wanda Rowe, Wanda Rowe   DOB:     06/16/1960   MRN:     [id number]   FIN:       [id number]   Accn#:   [id number]                                  Diagnostic Radiology     Exam                       Exam Date/Time       Ordering Physician   DIAG-PORTABLE CHEST        04/08/2009 11:45 EST WALL, ARTHUR A     Reason for Exam   ed 9 chest pain     Report   PORTABLE AP CHEST:     History: Status post hysterectomy, chest pain.     Comparison: None.     The heart and pulmonary vasculature are within normal limits. The lungs are   well-expanded and are clear bilaterally. There is no evidence of infiltrate   or effusion. Bony structures and soft tissues are within normal limits.     IMPRESSION:     No acute cardiopulmonary findings.   ********** VERIFIED REPORT **********     Dictated: 04/08/2009 11:55 am     [facility] M.D., Jones Skene   Signed (Electronic Signature):  [facility] M.D., LINDA L           04/08/09   11:56     Technologist: BDB    Order Note:   EMR Routing to: HOVERMALE, Trenia K - copy to - [id number]    Note: An exclamation mark (!) indicates a result that was not dispersed into   the flowsheet.  Document Creation Date: 04/08/2009 11:58 AM  _______________________________________________________________________    (1) Order result status: Final  Collection or observation date-time: 04/08/2009 11:45:00  Requested date-time: 04/08/2009 11:28:00  Receipt date-time:   Reported date-time: 04/08/2009 11:56:24  Referring Physician: Trudee Grip  Ordering Physician:  Non-EMR Physician [facility])  Specimen Source: RAD&Rad Type  Source: QRS  Filler Order Number: [id number]  Lab site: [facility]

## 2009-04-08 NOTE — Unmapped (Signed)
[facility]     PATIENT NAME:   Wanda Rowe, Wanda Rowe                   MR #:  [id number]   DATE OF BIRTH:  1960-08-12                        ACCOUNT #:  [id number]   ED PHYSICIAN:   Cherly Anderson, M.D.                 ROOM #:   PRIMARY:        Besan K. Hovermale, M.D.            NURSING UNIT:  ED   REFERRING:                                        FC:  C   DICTATED BY:    Cherly Anderson, M.D.                 ADMIT DATE:  04/08/2009   VISIT DATE:                                       DISCHARGE DATE:                               EMERGENCY DEPARTMENT NOTE     *-*-*     The patient was initially seen and evaluated by our emergency medicine nurse   practitioner Wayne Sever.  I have personally seen and examined the   patient, discussed treatment plan and evaluation with the emergency medicine   nurse practitioner.  I agree with the treatment plan and evaluation.  I have   no further addendums at this time.       *-*-*                                             _______________________________________   AW/nb                                  _____   D:  04/08/2009 11:41                  Cherly Anderson, M.D.   T:  04/08/2009 14:45   Job #:  [id number]                                    EMERGENCY DEPARTMENT NOTE                                                                PAGE    1 of   1   AW/nb  _____   D:  04/08/2009 11:41                  Cherly Anderson, M.D.   T:  04/08/2009 14:45   Job #:  [id number]                                    EMERGENCY DEPARTMENT NOTE                                                                PAGE    1 of   1

## 2009-04-08 NOTE — Unmapped (Signed)
[facility]     PATIENT NAME:   Wanda Rowe, Wanda Rowe                   MR #:  [id number]   DATE OF BIRTH:  1960-03-11                        ACCOUNT #:  [id number]   ED PHYSICIAN:   Cherly Anderson, M.D.                 ROOM #:   PRIMARY:        Mykenna K. Hovermale, M.D.            NURSING UNIT:  ED   REFERRING:                                        FC:  C   DICTATED BY:    Kathryne Sharper, N.P.           ADMIT DATE:  04/08/2009   VISIT DATE:                                       DISCHARGE DATE:                               EMERGENCY DEPARTMENT NOTE     *-*-*     CHIEF COMPLAINT:  Chest pain.     HISTORY OF PRESENT ILLNESS:  The patient has had intermittent chest pain for   the past month.  It has been constant since 9:00 this morning, radiating to   her back, and she said she just did not feel good.  No shortness of breath.   No nausea.  She does have some shortness of breath at times, but she feels it   is due to her being overweight and it is usually with activity, but she does   not get increased chest pain with activity.  Pain right now is 8/10 on the   pain scale.     PAST MEDICAL HISTORY:     1.  Mitral valve prolapse.   2.  Diabetes.   3.  Hypertension.   4.  High cholesterol.     PRIMARY CARE PHYSICIAN:  Sees Dr. Gari Crown for this.     MEDICATIONS:  At this point in time:     1.  Benicar.   2.  Janumet.   3.  Lovaza.   4.  Singulair.   5.  Flonase.   6.  Atrovent.   7.  Valtrex.   8.  Lexapro.     ALLERGIES:     1.  Sulfa.     REVIEW OF SYSTEMS:  Negative except which is stated above.     PHYSICAL EXAMINATION:     VITAL SIGNS:  Blood pressure 132/74, heart rate 88, respirations 18,   temperature 98.4, and O2 sats 97%.   GENERAL APPEARANCE:  Well-developed, well nourished, no acute distress.   SKIN:  Warm and dry.  No rash.   HEENT:  Normocephalic, atraumatic.  Pupils equal, round and reactive to   light.  Extraocular movements intact.  Conjunctivae clear.  Good dentition.   Oropharynx clear.  NECK:  Supple.  No masses.  Carotids 2+ bilaterally without bruit.  No   cervical or submandibular adenopathy.  Thyroid without nodules.   LUNGS:  Breathing comfortably.  No rales, rhonchi or wheezes.   HEART:  Regular rate and rhythm.  Normal S1 and S2.  No murmur, gallop or   rub.   ABDOMEN:  No hepatosplenomegaly.  No masses or tenderness.  No aortic/renal   bruit.   BACK:  No CVA tenderness.   EXTREMITIES:  No edema.  No cyanosis.  Dorsalis pedis pulses 2+.   NEUROLOGIC:  Cranial nerves II through XII intact.  Sensory intact to light   touch.  Motor grossly intact.   PSYCHIATRIC:  Oriented x 3.  Affect normal.     EKG:  We did an EKG on the patient, which was normal sinus rhythm, no ST   elevation, T-wave inversion, or ST depression with a rate of 82 and a normal   axis.     X-RAY:  She had a cardiac workup with a chest x-ray.  It was negative for any   type of cardiopulmonary issues.     LABORATORY DATA:   Two sets of cardiac markers:  CK-MB was 1.8.  The second   CK-MB was 1.4, troponins both less than 0.05.  CBC and electrolytes were   within normal limits.     At this point in time, the patient took aspirin before she got here this   morning.  I did call and speak with Dr. Rico Junker.  Dr. Rico Junker agrees that   the patient would be okay to have outpatient follow-up at this time and be   set up for outpatient cardiac testing.  So at this point in time, I will be   discharging the patient from the emergency department.  She is to call Dr.   Gerald Stabs office today to call for a follow-up appointment this week.  She   is to take an aspirin daily, continue all her other medications, return if   any of her symptoms worsen or concern her.     DIAGNOSIS:     1.  Chest pain, not otherwise specified.     DISPOSITION:  She is being discharged in stable condition.       *-*-*                                             _______________________________________   RAK/jf                                  _____   D:  04/08/2009 14:13                  Kathryne Sharper, N.P.   T:  04/08/2009 16:42   Job #:  [id number]                                         _______________________________________                                          _____  Cherly Anderson, M.D.   c:   Piedad K. Hovermale, M.D.                                EMERGENCY DEPARTMENT NOTE                                                                PAGE    1 of   1

## 2009-04-08 NOTE — Unmapped (Signed)
Signed by   LinkLogic on 04/08/2009 at 16:56:23  Patient: Wanda Rowe  Note: All result statuses are Final unless otherwise noted.    Tests: (1)  (MR)    Order Note:                                         [facility]     PATIENT NAMEANAMARIE, [name].                   MR #:  [id number]  DATE OF BIRTH:  1960-03-30                        ACCOUNT #:  [id number]  ED PHYSICIAN:   Cherly Anderson, M.D.                 ROOM #:  PRIMARY:        Carolee K. Hovermale, M.D.            NURSING UNIT:  ED  REFERRING:                                        FC:  C  DICTATED BY:    Kathryne Sharper, N.P.           ADMIT DATE:  04/08/2009  VISIT DATE:                                       DISCHARGE DATE:                               EMERGENCY DEPARTMENT NOTE     *-*-*     CHIEF COMPLAINT:  Chest pain.     HISTORY OF PRESENT ILLNESS:  The patient has had intermittent chest pain for  the past month.  It has been constant since 9:00 this morning, radiating to  her back, and she said she just did not feel good.  No shortness of breath.  No nausea.  She does have some shortness of breath at times, but she feels it  is due to her being overweight and it is usually with activity, but she does  not get increased chest pain with activity.  Pain right now is 8/10 on the  pain scale.     PAST MEDICAL HISTORY:     1.  Mitral valve prolapse.  2.  Diabetes.  3.  Hypertension.  4.  High cholesterol.     PRIMARY CARE PHYSICIAN:  Sees Dr. Gari Crown for this.     MEDICATIONS:  At this point in time:     1.  Benicar.  2.  Janumet.  3.  Lovaza.  4.  Singulair.  5.  Flonase.  6.  Atrovent.  7.  Valtrex.  8.  Lexapro.     ALLERGIES:     1.  Sulfa.     REVIEW OF SYSTEMS:  Negative except which is stated above.     PHYSICAL EXAMINATION:     VITAL SIGNS:  Blood pressure 132/74, heart rate 88, respirations 18,  temperature 98.4, and O2 sats 97%.  GENERAL APPEARANCE:  Well-developed, well nourished, no acute distress.  SKIN:  Warm and dry.  No  rash.  HEENT:  Normocephalic, atraumatic.  Pupils equal, round and reactive to  light.  Extraocular movements intact.  Conjunctivae clear.  Good dentition.  Oropharynx clear.  NECK:  Supple.  No masses.  Carotids 2+ bilaterally without bruit.  No  cervical or submandibular adenopathy.  Thyroid without nodules.  LUNGS:  Breathing comfortably.  No rales, rhonchi or wheezes.  HEART:  Regular rate and rhythm.  Normal S1 and S2.  No murmur, gallop or  rub.  ABDOMEN:  No hepatosplenomegaly.  No masses or tenderness.  No aortic/renal  bruit.  BACK:  No CVA tenderness.  EXTREMITIES:  No edema.  No cyanosis.  Dorsalis pedis pulses 2+.  NEUROLOGIC:  Cranial nerves II through XII intact.  Sensory intact to light  touch.  Motor grossly intact.  PSYCHIATRIC:  Oriented x 3.  Affect normal.     EKG:  We did an EKG on the patient, which was normal sinus rhythm, no ST  elevation, T-wave inversion, or ST depression with a rate of 82 and a normal  axis.     X-RAY:  She had a cardiac workup with a chest x-ray.  It was negative for any  type of cardiopulmonary issues.     LABORATORY DATA:   Two sets of cardiac markers:  CK-MB was 1.8.  The second  CK-MB was 1.4, troponins both less than 0.05.  CBC and electrolytes were  within normal limits.     At this point in time, the patient took aspirin before she got here this  morning.  I did call and speak with Dr. Rico Junker.  Dr. Rico Junker agrees that  the patient would be okay to have outpatient follow-up at this time and be  set up for outpatient cardiac testing.  So at this point in time, I will be  discharging the patient from the emergency department.  She is to call Dr.  Gerald Stabs office today to call for a follow-up appointment this week.  She  is to take an aspirin daily, continue all her other medications, return if  any of her symptoms worsen or concern her.     DIAGNOSIS:     1.  Chest pain, not otherwise specified.     DISPOSITION:  She is being discharged in stable condition.         *-*-*                                              _______________________________________  RAK/jf                                 _____  D:  04/08/2009 14:13                  Kathryne Sharper, N.P.  T:  04/08/2009 16:42  Job #:  [id number]                                        _______________________________________  _____                                        Cherly Anderson, M.D.  c:   Samiya K. Hovermale, M.D.                                EMERGENCY DEPARTMENT NOTE                                                               PAGE    1 of   1    Note: An exclamation mark (!) indicates a result that was not dispersed into   the flowsheet.  Document Creation Date: 04/08/2009 4:56 PM  _______________________________________________________________________    (1) Order result status: Final  Collection or observation date-time: 04/08/2009 00:00  Requested date-time:   Receipt date-time:   Reported date-time:   Referring Physician: Gari Crown  Ordering Physician:  Reviewed In Hospital [facility])  Specimen Source:   Source: DBS  Filler Order Number: [id number]  Lab site:

## 2009-04-11 NOTE — Unmapped (Signed)
Signed by Irena Cords MD on 04/11/2009 at 14:32:12      Reason for Visit   Chief Complaint: follow up from hospital Change    History from: patient    Allergies  ! SULFA  Allergy and adverse reaction list reviewed during this update.      Medications   SINGULAIR 10 MG TAB (MONTELUKAST SODIUM) one by mouth at bedtime  NEURONTIN 300 MG CAPS (GABAPENTIN) one by mouth tid  FLONASE 50 MCG/ACT SUSPN (FLUTICASONE PROPIONATE (NASAL)) two sprays each nostril at bedtime  ATROVENT 0.03 % SOLN (IPRATROPIUM BROMIDE) 2 sprays bid  VALTREX 500 MG TABS (VALACYCLOVIR HCL) one by mouth dailey  * CALCIUM CITRATE 1 qd  BENICAR HCT 20-12.5 MG TABS (OLMESARTAN MEDOXOMIL-HCTZ) one by mouth daily  SYNTHROID 75 MCG TABS (LEVOTHYROXINE SODIUM) one by mouth daily  * LOVASA 1 GRAM 4 qd  VITAMIN D 84132 UNIT CAPS (ERGOCALCIFEROL) 1 every month  VALTREX 500 MG TABS (VALACYCLOVIR HCL) one by mouth qd  * GLUCOMETER fsbs bid  * LANCETS TEST STRIPS fsbs bid  VITAMIN D 44010 UNIT CAPS (ERGOCALCIFEROL) one by mouth every week  B-12 250 MCG TABS (CYANOCOBALAMIN) 1 qd  TUSSIONEX PENNKINETIC ER 8-10 MG/5ML CR LIQ (CHLORPHENIRAMINE-HYDROCODONE) one teaspoon by mouth every twelve hours as needed for cough  BIAXIN 500 MG TABS (CLARITHROMYCIN) one by mouth twice a day  LEXAPRO 10 MG TABS (ESCITALOPRAM OXALATE) one by mouth daily  JANUMET 50-500 MG TABS (SITAGLIPTIN-METFORMIN HCL) 1 bid  * LANCETS, STRIPS FOR GLUCOMETER fsbs bid       Vital Signs:   Ht: 69 in.  Wt: 271 lbs.      BMI: 40.16  BSA: 2.35  Wt chg (lbs): 2  Temperature: 98.5  degrees F  oral  Pulse: 64 (regular)  Resp: 12    Patient appears to be in acute distress: no  BP: 124/82  Cuff size: large    Intake recorded by: Vance Peper on April 11, 2009 1:36 PM  o      HPI Multiple Chronic   Additional Dx: on and off cp x 1 mo nonexertional. not worse at night not worse after meals. not worse wuth movement. no more increase in stresssometimes diffuse sscp. lasts 30-60 mins taking more aleeve  recently. also lbp/knee pain    Current Status:     Compliance with diet: poor  Compliance with exercise: poor    ROS/Symptoms:   Patient denies any endocrine symptoms.  Patient denies any GI symptoms.  Patient denies any psychological symptoms.  Patient denies any general symptoms.    PAST HISTORY  Past Medical History (reviewed - no changes required):  Hyperlipidemia, Hypertension, Diabetes Type II, Hypothyroidism, GERD, Stomach polyps, RSD right knee, Anxiety, Mild Asthma, .    Surgical History (reviewed - no changes required):  ACL Repair: x left knee, Meniscal Repair: left knee x 2,  right knee x 1, Peridontal surgery    Family History (reviewed - no changes required): Mother - HTN, Degenerative Disc Disease  Father - DMII, HTN, Factor VIII inhibitor  Brother - Back Pain  MGM - DMII, Bladder CA, CVA  MGF - Colon CA  PGM - Breast CA        Physical Examination:   BP: 124/  82    Physical Exam- Detail:   General Appearance: well-developed, well-nourished and in no acute distress.  Respiratory: Respiration un-labored.  Lung fields clear to auscultation.  No wheezing, rales, rhonchi or pleural rub.  Cardiac: S1  and S2 normal.  RRR without murmurs, rubs, gallops.  No JVD.  Vascular: No carotid bruits.  No edema or varicosities.           New Problems:  CHEST PAIN, ATYPICAL (ICD-786.59)  New Medications:  OMEPRAZOLE 20 MG CPDR (OMEPRAZOLE) one by mouth daily      Preventive Maintenance       Coordinating Care Providers   PCP Name: Dr. Gari Crown             Prescriptions:  OMEPRAZOLE 20 MG CPDR (OMEPRAZOLE) one by mouth daily  #90 x 3   Entered and Authorized by: Mahi Zabriskie Johnn Hai MD   Signed by: Irena Cords MD on 04/11/2009   Method used: Print then Give to Patient   RxID: [id number]      Assessment and Plan     Problems   Status of Existing Problems:  Assessed CHEST PAIN, ATYPICAL as new - omeprazole, gxt - Emmory Solivan K Cordell Guercio MD  New Problems:  Dx of CHEST PAIN, ATYPICAL (ICD-786.59)  Onset:  04/11/2009    Medications   New Prescriptions/Refills:  OMEPRAZOLE 20 MG CPDR (OMEPRAZOLE) one by mouth daily  #90 x 3, 04/11/2009, Jayko Voorhees K Shanyn Preisler MD    Today's Orders   99213 - Ofc Vst, Est Level III [[id number]]  GXT - Nuclear - Myoview/Cardiolite [[id number]]                  ]

## 2009-04-24 NOTE — Unmapped (Signed)
Signed by Irena Cords MD on 04/24/2009 at 00:00:00  Cardiac Stress      Imported By: Vance Peper 05/01/2009 15:36:48    _____________________________________________________________________    External Attachment:    Please see [employer] EMR for this document.

## 2009-05-13 NOTE — Unmapped (Signed)
Signed by Irena Cords MD on 05/13/2009 at 11:13:53         Reason for Visit   Chief Complaint: follow up on stress test / R hip pain    History from: patient    Allergies  ! SULFA  Allergy and adverse reaction list reviewed during this update.      Medications   SINGULAIR 10 MG TAB (MONTELUKAST SODIUM) one by mouth at bedtime  NEURONTIN 300 MG CAPS (GABAPENTIN) one by mouth tid  FLONASE 50 MCG/ACT SUSPN (FLUTICASONE PROPIONATE (NASAL)) two sprays each nostril at bedtime  ATROVENT 0.03 % SOLN (IPRATROPIUM BROMIDE) 2 sprays bid  VALTREX 500 MG TABS (VALACYCLOVIR HCL) one by mouth dailey  * CALCIUM CITRATE 1 qd  BENICAR HCT 20-12.5 MG TABS (OLMESARTAN MEDOXOMIL-HCTZ) one by mouth daily  SYNTHROID 75 MCG TABS (LEVOTHYROXINE SODIUM) one by mouth daily  * LOVASA 1 GRAM 4 qd  VITAMIN D 54270 UNIT CAPS (ERGOCALCIFEROL) 1 every month  VALTREX 500 MG TABS (VALACYCLOVIR HCL) one by mouth qd  * GLUCOMETER fsbs bid  * LANCETS TEST STRIPS fsbs bid  VITAMIN D 62376 UNIT CAPS (ERGOCALCIFEROL) one by mouth every week  B-12 250 MCG TABS (CYANOCOBALAMIN) 1 qd  TUSSIONEX PENNKINETIC ER 8-10 MG/5ML CR LIQ (CHLORPHENIRAMINE-HYDROCODONE) one teaspoon by mouth every twelve hours as needed for cough  BIAXIN 500 MG TABS (CLARITHROMYCIN) one by mouth twice a day  LEXAPRO 10 MG TABS (ESCITALOPRAM OXALATE) one by mouth daily  JANUMET 50-500 MG TABS (SITAGLIPTIN-METFORMIN HCL) 1 bid  * LANCETS, STRIPS FOR GLUCOMETER fsbs bid  OMEPRAZOLE 20 MG CPDR (OMEPRAZOLE) one by mouth daily       Vital Signs:   Ht: 69 in.  Wt: 269 lbs.      BMI: 39.87  BSA: 2.35  Wt chg (lbs): -2  Temperature: 98.6  degrees F  oral  Pulse: 72 (regular)  Resp: 12    Patient appears to be in acute distress: no  BP: 114/76  Cuff size: large    Intake recorded by: Vance Peper on May 13, 2009 10:02 AM        HPI Multiple Chronic   Additional Dx: no further chest pain c/o r groin pain x 2 mos. friday after walking dog developed severe r hip pain. could hardly walk  little better but hurts even at rest. 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  Patient denies any respiratory symptoms.  Patient denies any GI symptoms.    PAST HISTORY  Past Medical History (reviewed - no changes required):  Hyperlipidemia, Hypertension, Diabetes Type II, Hypothyroidism, GERD, Stomach polyps, RSD right knee, Anxiety, Mild Asthma, .    Surgical History (reviewed - no changes required):  ACL Repair: x left knee, Meniscal Repair: left knee x 2,  right knee x 1, Peridontal surgery          Physical Examination:   BP: 114/  76    Physical Exam- Detail:   General Appearance: well-developed, well-nourished and in no acute distress.  Respiratory: Respiration un-labored.  Lung fields clear to auscultation.  No wheezing, rales, rhonchi or pleural rub.  Cardiac: S1 and S2 normal.  RRR without murmurs, rubs, gallops.  No JVD.  Psychiatric: Judgement and insight are within normal limits.  Alert and oriented x3.  No mood disorders noted, appropriate  affect.  Right Lower Extremity: hip without deformity nontender to palpation           New Problems:  HIP PAIN (ICD-719.45)  New Medications:  MEDROL (PAK) 4 MG TAB (METHYLPREDNISOLONE) use as directed  MEDROL (PAK) 4 MG TAB (METHYLPREDNISOLONE) use as directed      Preventive Maintenance       Coordinating Care Providers   PCP Name: Dr. Gari Crown             Prescriptions:  MEDROL (PAK) 4 MG TAB (METHYLPREDNISOLONE) use as directed  #1 x 0   Entered and Authorized by: Brianna Esson Johnn Hai MD   Signed by: Irena Cords MD on 05/13/2009   Method used: Print then Give to Patient   RxID: [id number]      Assessment and Plan     Problems   Status of Existing Problems:  Assessed HIP PAIN as new - medrol if no better ortho - Nadene Witherspoon Johnn Hai MD - Signed  Assessed CHEST PAIN, ATYPICAL as improved - reviewed results of gxt - Nyheim Seufert K  Demario Faniel MD  New Problems:  Dx of HIP PAIN (XLK-440.10)  Onset: 05/13/2009    Medications   New Prescriptions/Refills:  MEDROL (PAK) 4 MG TAB (METHYLPREDNISOLONE) use as directed  #1 x 0, 05/13/2009, Chana Lindstrom K Fernande Treiber MD    Today's Orders   Hemoglobin  A1C   (GLYCO) (496)  [CPT-83036]  CMP   (METAPNL) (10231) [CPT-80053]  Lipid Profile   (FATS) (7600) [CPT-80061]  TSH   (TSH) (899) [CPT-84443]  B-12  (B12)  (927) [*CPT-82607]  Folic Acid  (FOLATE) (466) [UVO-53664]  Vitamin D, 25-Hydrodroxy  (VITMD25) (17306) [CPT-82306]  Orthopaedic Consult [UCP-11111]  99214 - Ofc Vst, Est Level IV Maela.Pennant                  ]

## 2009-05-22 LAB — VITAMIN D 25 HYDROXY
Vit D, 25-Hydroxy: 30 ng/mL (ref 30–100)
Vitamin D2, 1,25 (~~LOC~~)2: 18 ng/mL
Vitamin D3, 1,25 (~~LOC~~)2: 12 ng/mL

## 2009-05-22 LAB — LIPID PANEL
Chol/HDL Ratio: 4.3 (ref <=5.0)
Cholesterol, Total: 197 mg/dL (ref 125–200)
HDL: 46 mg/dL (ref >=46)
LDL Cholesterol: 92 mg/dL (ref <130)
Triglycerides: 296 mg/dL (ref <150)

## 2009-05-22 LAB — COMPREHENSIVE METABOLIC PANEL
A/G Ratio: 1.7 (ref 1.0–2.1)
ALT: 61 units/L (ref 6–40)
AST: 36 units/L (ref 10–35)
Albumin: 4.7 g/dL (ref 3.6–5.1)
Alkaline Phosphatase: 94 units/L (ref 33–115)
BUN: 23 mg/dL (ref 7–25)
CO2: 28 mmol/L (ref 21–33)
Calcium: 9.8 mg/dL (ref 8.6–10.2)
Chloride: 102 mmol/L (ref 98–110)
Creatinine: 0.77 mg/dL (ref 0.59–1.07)
GFR MDRD Af Amer: 106 mL/min (ref >=60)
GFR MDRD Non Af Amer: 91 mL/min (ref >=60)
Globulin, Total: 2.7 g/dL (ref 2.2–3.9)
Glucose: 133 mg/dL (ref 65–99)
Potassium: 4.3 mmol/L (ref 3.5–5.3)
Sodium: 139 mmol/L (ref 135–146)
Total Bilirubin: 0.5 mg/dL (ref 0.2–1.2)
Total Protein: 7.4 g/dL (ref 6.2–8.3)

## 2009-05-22 LAB — FOLATE: Folic Acid: 17.4 ng/mL

## 2009-05-22 LAB — TSH: TSH: 1.81 microintl units/mL

## 2009-05-22 LAB — VITAMIN B12: Vitamin B-12: 477 pg/mL (ref 200–1100)

## 2009-05-22 LAB — HEMOGLOBIN A1C: Hemoglobin A1C: 7 % (ref <5.7)

## 2009-05-22 NOTE — Unmapped (Signed)
Signed by Vance Peper on 05/23/2009 at 09:05:41    PHONE NOTE  Caller's Cell Phone #: [phone]  Caller: patient  Department: IM - General  Call for: [facility]    Reason for Call: speak with nurse, speak with provider. appt with Ortho is on 05/29/09    can she have something called in for hip pain- muscle relaxer? she is going out of town tomorrow at noon- please call in the AM with response    Pharmacy Information: [phone]      Initial call taken by: Shirlean Mylar,  May 22, 2009 4:30 PM    Current Allergies:   ! SULFA    New Medications:  Prescriptions:  SKELAXIN 800 MG TABS (METAXALONE) one by mouth three times a day  #30 x 0   Entered and Authorized by: Irena Cords MD   Signed by: Vance Peper on 05/23/2009   Method used: Print then Give to Patient   RxID: [id number]  VICODIN 5-500 MG TABS (ACETAMINOPHEN-HYDROCODONE) one by mouth every four to six hours as needed  #30 x 0   Entered and Authorized by: Irena Cords MD   Signed by: Vance Peper on 05/23/2009   Method used: Print then Give to Patient   RxID: [id number]  VICODIN 5-500 MG TABS (ACETAMINOPHEN-HYDROCODONE) one by mouth every four to six hours as needed  SKELAXIN 800 MG TABS (METAXALONE) one by mouth three times a day    FOLLOW UP  both meds called in and pt notified  patient advised, phone call completed, Rx completed  Follow-up by:  Vance Peper,  May 23, 2009 9:05 AM    Prescriptions:  SKELAXIN 800 MG TABS (METAXALONE) one by mouth three times a day  #30 x 0   Entered and Authorized by: Irena Cords MD   Signed by: Vance Peper on 05/23/2009   Method used: Print then Give to Patient   RxID: [id number]  VICODIN 5-500 MG TABS (ACETAMINOPHEN-HYDROCODONE) one by mouth every four to six hours as needed  #30 x 0   Entered and Authorized by: Irena Cords MD   Signed by: Vance Peper on 05/23/2009   Method used: Print then Give to Patient   RxID: [id number]

## 2009-05-22 NOTE — Unmapped (Signed)
Signed by Irena Cords MD on 05/27/2009 at 05:00:07  Patient: Wanda Rowe  Note: All result statuses are Final unless otherwise noted.    Tests: (1) COMPREHENSIVE METABOLIC PANEL W/eGFR (QDL-10231)    GLUCOSE              [H]  133 mg/dL                   45-40                        Fasting reference interval           UREA NITROGEN (BUN)       23 mg/dL                    9-81    CREATININE                0.77 mg/dL                  0.59-1.07   eGFR NON-AFR. AMERICAN                              91 mL/min/1.62m2            > OR = 60   eGFR AFRICAN AMERICAN                              106 mL/min/1.73m2           > OR = 60   BUN/CREATININE RATIO (calc)                              NOT APPLICABLE              6-22    SODIUM                    139 mmol/L                  135-146    POTASSIUM                 4.3 mmol/L                  3.5-5.3    CHLORIDE                  102 mmol/L                  98-110    CARBON DIOXIDE            28 mmol/L                   21-33    CALCIUM                   9.8 mg/dL                   1.9-14.7    PROTEIN, TOTAL            7.4 g/dL                    8.2-9.5    ALBUMIN                   4.7 g/dL  3.6-5.1    GLOBULIN (calc)           2.7 g/dL                    6.2-8.3   ALBUMIN/GLOBULIN RATIO (calc)                              1.7                         1.0-2.1    BILIRUBIN, TOTAL          0.5 mg/dL                   1.5-1.7    ALKALINE PHOSPHATASE      94 U/L                      33-115    AST                  [H]  36 U/L                      10-35    ALT                  [H]  61 U/L                      6-40    Note: An exclamation mark (!) indicates a result that was not dispersed into   the flowsheet.  Document Creation Date: 05/24/2009 7:21 PM  _______________________________________________________________________    (1) Order result status: Final  Collection or observation date-time: 05/22/2009 09:26  Requested date-time:   Receipt date-time:  05/22/2009 09:28  Reported date-time: 05/24/2009 19:00  Referring Physician:    Ordering Physician: Lyrical Sowle (HOVERMAK)  Specimen Source: S  Source: [name] Order Number: [id number]  Lab site: [facility] [place]      [address]      [place]  [id number]      -----------------    The following non-numeric lab results were dispersed to  the flowsheet even though numeric results were expected:      BUN/CREATININE RATIO (calc), NOT APPLICABLE

## 2009-05-22 NOTE — Unmapped (Signed)
Signed by Irena Cords MD on 05/27/2009 at 05:00:07  Patient: Wanda Rowe  Note: All result statuses are Final unless otherwise noted.    Tests: (1) TSH, 3RD GENERATION (QDL-899)    TSH, 3RD GENERATION       1.81 mIU/L      Reference Range              > or = 20 Years  0.40-4.50                   Pregnancy Ranges       First trimester    0.20-4.70       Second trimester   0.30-4.10       Third trimester    0.40-2.70    Note: An exclamation mark (!) indicates a result that was not dispersed into   the flowsheet.  Document Creation Date: 05/24/2009 7:21 PM  _______________________________________________________________________    (1) Order result status: Final  Collection or observation date-time: 05/22/2009 09:26  Requested date-time:   Receipt date-time: 05/22/2009 09:28  Reported date-time: 05/24/2009 19:00  Referring Physician:    Ordering Physician: Kelis Plasse (HOVERMAK)  Specimen Source: S  Source: [name] Order Number: [id number]  Lab site: [facility]      [address]      [place]    [address]

## 2009-05-22 NOTE — Unmapped (Signed)
Signed by Irena Cords MD on 05/27/2009 at 05:00:37  Patient: Wanda Rowe  Note: All result statuses are Final unless otherwise noted.    Tests: (1) LIPID PANEL (QDL-7600)    CHOLESTEROL, TOTAL        197 mg/dL                   811-914    HDL CHOLESTEROL           46 mg/dL                    > OR = 46    TRIGLYCERIDES        [H]  296 mg/dL                   <782   LDL-CHOLESTEROL (calc)                              92 mg/dL                    <956             Desirable range <100 mg/dL for patients with CHD or      diabetes and <70 mg/dL for diabetic patients with      known heart disease.          CHOL/HDLC RATIO (calc)                              4.3                         < OR = 5.0    Note: An exclamation mark (!) indicates a result that was not dispersed into   the flowsheet.  Document Creation Date: 05/24/2009 7:21 PM  _______________________________________________________________________    (1) Order result status: Final  Collection or observation date-time: 05/22/2009 09:26  Requested date-time:   Receipt date-time: 05/22/2009 09:28  Reported date-time: 05/24/2009 19:00  Referring Physician:    Ordering Physician: Shayleen Eppinger (HOVERMAK)  Specimen Source: S  Source: [name] Order Number: [id number]  Lab site: [facility] [place]      [address]      [place]  [place]  [address]

## 2009-05-22 NOTE — Unmapped (Signed)
Signed by Irena Cords MD on 05/27/2009 at 05:00:07  Patient: Wanda Rowe  Note: All result statuses are Final unless otherwise noted.    Tests: (1) HEMOGLOBIN A1c (QDL-496)   HEMOGLOBIN A1c (% of total Hgb)                         [H]  7.0 %                       <5.7                      Consistent with diabetes                       <5.7       Decreased risk of diabetes                       5.7-6.0    Increased risk of diabetes                       6.1-6.4    Higher risk of diabetes                       > or = 6.5 Consistent with diabetes                         Standards of Medical Care in Diabetes-2010.                  Diabetes Care, 33(Supp 1): Z6-X09,6045.            REPORT COMMENT:      FASTING    Note: An exclamation mark (!) indicates a result that was not dispersed into   the flowsheet.  Document Creation Date: 05/24/2009 7:21 PM  _______________________________________________________________________    (1) Order result status: Final  Collection or observation date-time: 05/22/2009 09:26  Requested date-time:   Receipt date-time: 05/22/2009 09:28  Reported date-time: 05/24/2009 19:00  Referring Physician:    Ordering Physician: Fama Muenchow (HOVERMAK)  Specimen Source: B  Source: [name] Order Number: [id number]  Lab site: [facility] [place]      [address]      [place]  [place]  [address]

## 2009-05-22 NOTE — Unmapped (Signed)
Signed by Irena Cords MD on 05/27/2009 at 05:00:07  Patient: Wanda Rowe  Note: All result statuses are Final unless otherwise noted.    Tests: (1) VITAMIN D, 25-HYDROXY, LC/MS/MS (ZOX-09604)   VITAMIN D, 25-[place], TOTAL                              30 ng/mL                    30-100             25-OHD3 indicates both endogenous production and      supplementation. 25-OHD2 is an indicator of exogenous      sources, such as diet or supplementation. Therapy is      based on measurement of Total 25-OHD, with levels       <20 ng/mL indicative of Vitamin D deficiency, while       levels between 20 ng/mL and 30 ng/mL suggest      insufficiency. Optimal levels are > or = 30 ng/mL.    VITAMIN D, 25-[place], D3      12 ng/mL      Reference Range      Not established    VITAMIN D, 25-[place], D2      18 ng/mL      Reference Range      Not established    Note: An exclamation mark (!) indicates a result that was not dispersed into   the flowsheet.  Document Creation Date: 05/24/2009 7:21 PM  _______________________________________________________________________    (1) Order result status: Final  Collection or observation date-time: 05/22/2009 09:26  Requested date-time:   Receipt date-time: 05/22/2009 09:28  Reported date-time: 05/24/2009 19:00  Referring Physician:    Ordering Physician: Rodd Heft (HOVERMAK)  Specimen Source: S  Source: [name] Order Number: [id number] [id number]  Lab site: [facility] [place]      [address]      [place]  [place]  [address]

## 2009-05-22 NOTE — Unmapped (Signed)
Signed by Irena Cords MD on 05/27/2009 at 05:00:07  Patient: Wanda Rowe  Note: All result statuses are Final unless otherwise noted.    Tests: (1) VITAMIN B12 (QDL-927)    VITAMIN B12               477 pg/mL                   [phone]    Note: An exclamation mark (!) indicates a result that was not dispersed into   the flowsheet.  Document Creation Date: 05/24/2009 7:21 PM  _______________________________________________________________________    (1) Order result status: Final  Collection or observation date-time: 05/22/2009 09:26  Requested date-time:   Receipt date-time: 05/22/2009 09:28  Reported date-time: 05/24/2009 19:00  Referring Physician:    Ordering Physician: Denijah Karrer (HOVERMAK)  Specimen Source: S  Source: [name] Order Number: [id number]  Lab site: [facility] [place]      [address]      [place]  [place]  [address]

## 2009-05-22 NOTE — Unmapped (Signed)
Signed by Irena Cords MD on 05/27/2009 at 05:00:07  Patient: Wanda Rowe  Note: All result statuses are Final unless otherwise noted.    Tests: (1) FOLATE, SERUM (QDL-466)    FOLATE, SERUM             17.4 ng/mL                                 Reference Range                                 Low:           <3.4                                 Borderline:    3.4-5.4                                 Normal:        >5.4           Note: An exclamation mark (!) indicates a result that was not dispersed into   the flowsheet.  Document Creation Date: 05/24/2009 7:21 PM  _______________________________________________________________________    (1) Order result status: Final  Collection or observation date-time: 05/22/2009 09:26  Requested date-time:   Receipt date-time: 05/22/2009 09:28  Reported date-time: 05/24/2009 19:00  Referring Physician:    Ordering Physician: Etha Stambaugh (HOVERMAK)  Specimen Source: S  Source: [name] Order Number: [id number]  Lab site: [facility] [place]      [address]      [place]  [place]  [address]

## 2009-05-29 NOTE — Unmapped (Signed)
Signed by Irena Cords MD on 05/29/2009 at 00:00:00  [facility]      Imported By: Addison Naegeli 06/20/2009 09:59:42    _____________________________________________________________________    External Attachment:    Please see [facility] EMR for this document.

## 2009-05-31 NOTE — Unmapped (Signed)
Signed by Irena Cords MD on 05/31/2009 at 09:35:51      Reason for Visit   Chief Complaint: Lab Review    History from: patient    Allergies  ! SULFA  Allergy and adverse reaction list reviewed during this update.      Medications   SINGULAIR 10 MG TAB (MONTELUKAST SODIUM) one by mouth at bedtime  NEURONTIN 300 MG CAPS (GABAPENTIN) one by mouth tid  FLONASE 50 MCG/ACT SUSPN (FLUTICASONE PROPIONATE (NASAL)) two sprays each nostril at bedtime  ATROVENT 0.03 % SOLN (IPRATROPIUM BROMIDE) 2 sprays bid  VALTREX 500 MG TABS (VALACYCLOVIR HCL) one by mouth dailey  * CALCIUM CITRATE 1 qd  BENICAR HCT 20-12.5 MG TABS (OLMESARTAN MEDOXOMIL-HCTZ) one by mouth daily  SYNTHROID 75 MCG TABS (LEVOTHYROXINE SODIUM) one by mouth daily  * LOVASA 1 GRAM 4 qd  VITAMIN D 81191 UNIT CAPS (ERGOCALCIFEROL) 1 every month  VALTREX 500 MG TABS (VALACYCLOVIR HCL) one by mouth qd  * GLUCOMETER fsbs bid  * LANCETS TEST STRIPS fsbs bid  VITAMIN D 47829 UNIT CAPS (ERGOCALCIFEROL) one by mouth every week  B-12 250 MCG TABS (CYANOCOBALAMIN) 1 qd  TUSSIONEX PENNKINETIC ER 8-10 MG/5ML CR LIQ (CHLORPHENIRAMINE-HYDROCODONE) one teaspoon by mouth every twelve hours as needed for cough  BIAXIN 500 MG TABS (CLARITHROMYCIN) one by mouth twice a day  LEXAPRO 10 MG TABS (ESCITALOPRAM OXALATE) one by mouth daily  JANUMET 50-500 MG TABS (SITAGLIPTIN-METFORMIN HCL) 1 bid  * LANCETS, STRIPS FOR GLUCOMETER fsbs bid  OMEPRAZOLE 20 MG CPDR (OMEPRAZOLE) one by mouth daily  VICODIN 5-500 MG TABS (ACETAMINOPHEN-HYDROCODONE) one by mouth every four to six hours as needed  SKELAXIN 800 MG TABS (METAXALONE) one by mouth three times a day       Vital Signs:   Ht: 69 in.  Wt: 270 lbs.      BMI: 40.02  BSA: 2.35  Wt chg (lbs): 1  Temperature: 98.6  degrees F  oral  Pulse: 72 (regular)  Resp: 12    Patient appears to be in acute distress: no  BP: 122/82  Cuff size: large    Intake recorded by: Vance Peper on May 31, 2009 8:26 AM        HPI Multiple Chronic      Additional Dx: f/u thyroid, neuropathy, htn, dm2, gerd, vit d, depression, allergies    Current Status:     Compliance with diet: fair  Compliance with exercise: fair    ROS/Symptoms:   Patient denies any cardiovascular symptoms.  Patient denies any respiratory symptoms.  Patient denies any GI symptoms.  Patient denies any psychological symptoms.    PAST HISTORY  Past Medical History (reviewed - no changes required):  Hyperlipidemia, Hypertension, Diabetes Type II, Hypothyroidism, GERD, Stomach polyps, RSD right knee, Anxiety, Mild Asthma, .    Surgical History (reviewed - no changes required):  ACL Repair: x left knee, Meniscal Repair: left knee x 2,  right knee x 1, Peridontal surgery          Physical Examination:   BP: 122/  82    Physical Exam- Detail:   General Appearance: well-developed, well-nourished and in no acute distress.  Respiratory: Respiration un-labored.  Lung fields clear to auscultation.  No wheezing, rales, rhonchi or pleural rub.  Cardiac: S1 and S2 normal.  RRR without murmurs, rubs, gallops.  No JVD.  Vascular: No carotid bruits.  No edema or varicosities.           New  Medications:  SYNTHROID 88 MCG TABS (LEVOTHYROXINE SODIUM) one by mouth daily  VITAMIN D 16109 UNIT CAPS (ERGOCALCIFEROL) one by mouth every week  JANUMET 50-1000 MG TABS (SITAGLIPTIN-METFORMIN HCL) 1 bid      Preventive Maintenance       Coordinating Care Providers   PCP Name: Dr. Gari Crown             Prescriptions:  LEXAPRO 10 MG TABS (ESCITALOPRAM OXALATE) one by mouth daily  #30 x 5   Entered and Authorized by: Izyk Marty Johnn Hai MD   Signed by: Irena Cords MD on 05/31/2009   Method used: Print then Give to Patient   RxID: [id number]  BENICAR HCT 20-12.5 MG TABS (OLMESARTAN MEDOXOMIL-HCTZ) one by mouth daily  #30 x 5   Entered and Authorized by: Braxen Dobek Johnn Hai MD   Signed by: Irena Cords MD on 05/31/2009   Method used: Print then Give to Patient   RxID: [id number]  ATROVENT 0.03 % SOLN (IPRATROPIUM  BROMIDE) 2 sprays bid  #1 x 5   Entered and Authorized by: Cambell Rickenbach Johnn Hai MD   Signed by: Irena Cords MD on 05/31/2009   Method used: Print then Give to Patient   RxID: [id number]  FLONASE 50 MCG/ACT SUSPN (FLUTICASONE PROPIONATE (NASAL)) two sprays each nostril at bedtime  #1 x 5   Entered and Authorized by: Karlina Suares Johnn Hai MD   Signed by: Irena Cords MD on 05/31/2009   Method used: Print then Give to Patient   RxID: [id number]  NEURONTIN 300 MG CAPS (GABAPENTIN) one by mouth tid  #90 x 5   Entered and Authorized by: Shealee Yordy Johnn Hai MD   Signed by: Irena Cords MD on 05/31/2009   Method used: Print then Give to Patient   RxID: [id number]  SINGULAIR 10 MG TAB (MONTELUKAST SODIUM) one by mouth at bedtime  #30 x 5   Entered and Authorized by: Pam Vanalstine Johnn Hai MD   Signed by: Irena Cords MD on 05/31/2009   Method used: Print then Give to Patient   RxID: [id number]  VITAMIN D 55732 UNIT CAPS (ERGOCALCIFEROL) one by mouth every week  #4 x 5   Entered and Authorized by: Danish Ruffins Johnn Hai MD   Signed by: Irena Cords MD on 05/31/2009   Method used: Print then Give to Patient   RxID: [id number]  LOVASA 1 GRAM 4 qd  #360 x 5   Entered and Authorized by: Sheralee Qazi Johnn Hai MD   Signed by: Irena Cords MD on 05/31/2009   Method used: Print then Give to Patient   RxID: [id number]  SYNTHROID 88 MCG TABS (LEVOTHYROXINE SODIUM) one by mouth daily  #30 x 5   Entered and Authorized by: Dody Smartt Johnn Hai MD   Signed by: Irena Cords MD on 05/31/2009   Method used: Print then Give to Patient   RxID: [id number]  JANUMET 50-1000 MG TABS (SITAGLIPTIN-METFORMIN HCL) 1 bid  #60 x 5   Entered and Authorized by: Deagen Krass Johnn Hai MD   Signed by: Irena Cords MD on 05/31/2009   Method used: Print then Give to Patient   RxID: [id number]      Assessment and Plan     Problems   Status of Existing Problems:  Assessed TRANSAMINASES, SERUM, ELEVATED as unchanged - increase lovasa -  Anab Vivar K Durand Wittmeyer MD  Assessed VITAMIN D DEFICIENCY as improved - increase to weekly - Debera Sterba Johnn Hai MD  Assessed  HYPOTHYROIDISM as improved - increase 88 - Lavell Ridings K Ahmani Prehn MD  Assessed ASTHMA as improved - continue current medications - Logen Fowle K Ezra Marquess MD  Assessed DIABETES MELLITUS, TYPE II as improved - increase janumet - Tatia Petrucci K Armel Rabbani MD  Assessed HYPERTRIGLYCERIDEMIA as unchanged - increase lovasa - Rorey Bisson K Prentiss Hammett MD  Assessed HYPERTENSION as improved - continue current medications - Graeme Menees K Careem Yasui MD  Assessed ANXIETY as improved - continue current medications - Sarajean Dessert K Hayes Czaja MD    Medications   New Prescriptions/Refills:  LEXAPRO 10 MG TABS (ESCITALOPRAM OXALATE) one by mouth daily  #30 x 5, 05/31/2009, Justyce Yeater K Jonetta Dagley MD  BENICAR HCT 20-12.5 MG TABS (OLMESARTAN MEDOXOMIL-HCTZ) one by mouth daily  #30 x 5, 05/31/2009, Gretel Cantu K Amana Bouska MD  ATROVENT 0.03 % SOLN (IPRATROPIUM BROMIDE) 2 sprays bid  #1 x 5, 05/31/2009, Riddik Senna K Congetta Odriscoll MD  FLONASE 50 MCG/ACT SUSPN (FLUTICASONE PROPIONATE (NASAL)) two sprays each nostril at bedtime  #1 x 5, 05/31/2009, Aviela Blundell K Stokes Rattigan MD  NEURONTIN 300 MG CAPS (GABAPENTIN) one by mouth tid  #90 x 5, 05/31/2009, Devron Cohick K Emilee Market MD  SINGULAIR 10 MG TAB (MONTELUKAST SODIUM) one by mouth at bedtime  #30 x 5, 05/31/2009, Anarely Nicholls K Leanore Biggers MD  VITAMIN D 10272 UNIT CAPS (ERGOCALCIFEROL) one by mouth every week  #4 x 5, 05/31/2009, Shanta Dorvil K Rawleigh Rode MD  LOVASA 1 GRAM 4 qd  #360 x 5, 05/31/2009, Kelin Borum K Madisen Ludvigsen MD  SYNTHROID 88 MCG TABS (LEVOTHYROXINE SODIUM) one by mouth daily  #30 x 5, 05/31/2009, Agam Davenport K Catalia Massett MD  JANUMET 50-1000 MG TABS (SITAGLIPTIN-METFORMIN HCL) 1 bid  #60 x 5, 05/31/2009, Jakki Doughty K Reinhard Schack MD    Today's Orders   CMP   (METAPNL) (10231) [CPT-80053]  Lipid Profile   (FATS) (7600) [CPT-80061]  Hemoglobin  A1C   (GLYCO) (496)  [CPT-83036]  TSH   (TSH) (899) [CPT-84443]  Vitamin D, 25-Hydrodroxy  (VITMD25) (17306) [CPT-82306]  C-Reactive Protein, High Sensitivity     (CRPHS) (10124) [CPT-86141]  99214 - Ofc Vst, Est Level IV [CPT-99214]  HgA1C  -  >= 12mos. care  7 to 9 % [CPT-3045F]  LDL  -  >= 12mos. care  < 100 [CPT-3048F]  BP Systolic  -  >= 12mos. care  < 130 [CPT-3074F]  BP Diastolic  -  >= 12mos. care  80-89 [CPT-3079F]                  Preventive Maintenance       ]

## 2009-07-10 NOTE — Unmapped (Signed)
Signed by Bobbe Medico on 07/10/2009 at 16:07:00    PHONE NOTE  Caller's Cell Phone #: [phone]  Caller: patient  Department: IM - General  Call for: [facility]     Reason for Call: Pt has a sinus infection going on a cruise friday- can you call in a antibotic- z-pack     Tried to schedule: patient refused appointments offered  Pharmacy Information: [phone]       Initial call taken by: Avanell Shackleton,  July 10, 2009 2:14 PM    Current Allergies:   ! SULFA    FOLLOW UP  appt tomorrow  Follow-up by:  Irena Cords MD,  July 10, 2009 3:14 PM    FOLLOW UP  LMOM....when she calls back please advise her that she needs an appt.  Follow-up by:  Vance Peper,  July 10, 2009 3:16 PM    FOLLOW UP  appt scheduled  Follow-up by:  Jan Ryan,  July 10, 2009 4:07 PM

## 2009-07-11 NOTE — Unmapped (Signed)
Signed by Irena Cords MD on 07/15/2009 at 05:44:11      Reason for Visit   Chief Complaint: Sinus Infection    History from: patient    Allergies  ! SULFA  Allergy and adverse reaction list reviewed during this update.      Medications   SINGULAIR 10 MG TAB (MONTELUKAST SODIUM) one by mouth at bedtime  NEURONTIN 300 MG CAPS (GABAPENTIN) one by mouth tid  FLONASE 50 MCG/ACT SUSPN (FLUTICASONE PROPIONATE (NASAL)) two sprays each nostril at bedtime  ATROVENT 0.03 % SOLN (IPRATROPIUM BROMIDE) 2 sprays bid  VALTREX 500 MG TABS (VALACYCLOVIR HCL) one by mouth dailey  * CALCIUM CITRATE 1 qd  BENICAR HCT 20-12.5 MG TABS (OLMESARTAN MEDOXOMIL-HCTZ) one by mouth daily  SYNTHROID 88 MCG TABS (LEVOTHYROXINE SODIUM) one by mouth daily  * LOVASA 1 GRAM 4 qd  VALTREX 500 MG TABS (VALACYCLOVIR HCL) one by mouth qd  * GLUCOMETER fsbs bid  * LANCETS TEST STRIPS fsbs bid  VITAMIN D 19379 UNIT CAPS (ERGOCALCIFEROL) one by mouth every week  B-12 250 MCG TABS (CYANOCOBALAMIN) 1 qd  LEXAPRO 10 MG TABS (ESCITALOPRAM OXALATE) one by mouth daily  JANUMET 50-1000 MG TABS (SITAGLIPTIN-METFORMIN HCL) 1 bid  * LANCETS, STRIPS FOR GLUCOMETER fsbs bid  OMEPRAZOLE 20 MG CPDR (OMEPRAZOLE) one by mouth daily       Vital Signs:   Ht: 69 in.  Wt: 268 lbs.      BMI: 39.72  BSA: 2.34  Wt chg (lbs): -2  Temperature: 99.0  degrees F  oral  Pulse: 72 (regular)  Resp: 12    Patient appears to be in acute distress: no  BP: 124/82  Cuff size: large    Intake recorded by: Vance Peper on July 11, 2009 2:15 PM        HPI Multiple Chronic   Additional Dx: c/o facial pressure head congestion going on cruise. having trouble with diarrhea since increaased lovasa and janumet    ROS/Symptoms:   Patient denies any cardiovascular symptoms.  Patient denies any GI symptoms.    PAST HISTORY  Past Medical History (reviewed - no changes required):  Hyperlipidemia, Hypertension, Diabetes Type II, Hypothyroidism, GERD, Stomach polyps, RSD right knee, Anxiety, Mild  Asthma, .          Physical Examination:   BP: 124/  82    Physical Exam- Detail:   General Appearance: well-developed, well-nourished and in no acute distress.  Ears: effusion  Oropharynx: erythema  Respiratory: Respiration un-labored.  Lung fields clear to auscultation.  No wheezing, rales, rhonchi or pleural rub.  Cardiac: S1 and S2 normal.  RRR without murmurs, rubs, gallops.  No JVD.           New Medications:  ZITHROMAX 250 MG TABS (AZITHROMYCIN) two by mouth now, then one by mouth daily for four additional days      Preventive Maintenance       Coordinating Care Providers   PCP Name: Dr. Gari Crown             Prescriptions:  ZITHROMAX 250 MG TABS (AZITHROMYCIN) two by mouth now, then one by mouth daily for four additional days  #6 x 0   Entered and Authorized by: Trevino Wyatt Johnn Hai MD   Signed by: Irena Cords MD on 07/11/2009   Method used: Print then Give to Patient   RxID: [id number]      Assessment and Plan     Problems   Status  of Existing Problems:  Assessed SINUSITIS, ACUTE as deteriorated - zpak - Zev Blue K Johonna Binette MD    Medications   New Prescriptions/Refills:  ZITHROMAX 250 MG TABS (AZITHROMYCIN) two by mouth now, then one by mouth daily for four additional days  #6 x 0, 07/11/2009, Kourtni Stineman K Rayyan Burley MD    Today's Orders   99213 - Ofc Vst, Est Level III [place]                ]

## 2009-09-16 NOTE — Unmapped (Signed)
Signed by Vance Peper on 09/16/2009 at 08:27:56    Prescriptions:  LEXAPRO 10 MG TABS (ESCITALOPRAM OXALATE) one by mouth daily  #30 x 2   Entered by: Vance Peper   Authorized by: Irena Cords MD   Signed by: Vance Peper on 09/16/2009   Method used: Telephoned to ...     [facility] [place] (retail)     [address]     [place], [place]  [address]     Ph: [phone]     Fax: [phone]   RxID: [id number]

## 2009-09-19 LAB — COMPREHENSIVE METABOLIC PANEL
A/G Ratio: 1.8 (ref 1.0–2.1)
ALT: 53 units/L (ref 6–40)
AST: 34 units/L (ref 10–35)
Albumin: 4.7 g/dL (ref 3.6–5.1)
Alkaline Phosphatase: 71 units/L (ref 33–115)
BUN: 23 mg/dL (ref 7–25)
CO2: 28 mmol/L (ref 21–33)
Calcium: 10.2 mg/dL (ref 8.6–10.2)
Chloride: 102 mmol/L (ref 98–110)
Creatinine: 0.71 mg/dL (ref 0.59–1.07)
GFR MDRD Af Amer: 117 mL/min (ref >=60)
GFR MDRD Non Af Amer: 101 mL/min (ref >=60)
Globulin, Total: 2.6 g/dL (ref 2.2–3.9)
Glucose: 99 mg/dL (ref 65–99)
Potassium: 4.3 mmol/L (ref 3.5–5.3)
Sodium: 141 mmol/L (ref 135–146)
Total Bilirubin: 0.6 mg/dL (ref 0.2–1.2)
Total Protein: 7.3 g/dL (ref 6.2–8.3)

## 2009-09-19 LAB — VITAMIN D 25 HYDROXY
Vit D, 25-Hydroxy: 51 ng/mL (ref 30–100)
Vitamin D2, 1,25 (~~LOC~~)2: 31 ng/mL
Vitamin D3, 1,25 (~~LOC~~)2: 20 ng/mL

## 2009-09-19 LAB — HEMOGLOBIN A1C: Hemoglobin A1C: 6.8 % (ref <5.7)

## 2009-09-19 LAB — TSH: TSH: 2.69 microintl units/mL

## 2009-09-19 LAB — LIPID PANEL
Chol/HDL Ratio: 3.4 (ref <=5.0)
Cholesterol, Total: 226 mg/dL (ref 125–200)
HDL: 66 mg/dL (ref >=46)
LDL Cholesterol: 122 mg/dL (ref <130)
Triglycerides: 188 mg/dL (ref <150)

## 2009-09-19 NOTE — Unmapped (Signed)
Signed by Irena Cords MD on 09/22/2009 at 23:20:47  Patient: Wanda Rowe  Note: All result statuses are Final unless otherwise noted.    Tests: (1) TSH, 3RD GENERATION (QDL-899)    TSH, 3RD GENERATION       2.69 mIU/L      Reference Range              > or = 20 Years  0.40-4.50                   Pregnancy Ranges       First trimester    0.20-4.70       Second trimester   0.30-4.10       Third trimester    0.40-2.70            REPORT COMMENT:      FASTING    Note: An exclamation mark (!) indicates a result that was not dispersed into   the flowsheet.  Document Creation Date: 09/20/2009 12:27 AM  _______________________________________________________________________    (1) Order result status: Final  Collection or observation date-time: 09/19/2009 09:39  Requested date-time:   Receipt date-time: 09/19/2009 09:40  Reported date-time: 09/20/2009 00:00  Referring Physician:    Ordering Physician: Adriann Thau (HOVERMAK)  Specimen Source: S  Source: [name] Order Number: [id number]  Lab site: [facility] [place]      [address]      [place]  [place]  [address]

## 2009-09-19 NOTE — Unmapped (Signed)
Signed by Irena Cords MD on 09/20/2009 at 10:26:34  Patient: Wanda Rowe  Note: All result statuses are Final unless otherwise noted.    Tests: (1) LIPID PANEL (QDL-7600)    CHOLESTEROL, TOTAL   [H]  226 mg/dL                   952-841    HDL CHOLESTEROL           66 mg/dL                    > OR = 46    TRIGLYCERIDES        [H]  188 mg/dL                   <324   LDL-CHOLESTEROL (calc)                              122 mg/dL                   <401             Desirable range <100 mg/dL for patients with CHD or      diabetes and <70 mg/dL for diabetic patients with      known heart disease.          CHOL/HDLC RATIO (calc)                              3.4                         < OR = 5.0    Note: An exclamation mark (!) indicates a result that was not dispersed into   the flowsheet.  Document Creation Date: 09/19/2009 10:53 PM  _______________________________________________________________________    (1) Order result status: Final  Collection or observation date-time: 09/19/2009 09:39  Requested date-time:   Receipt date-time: 09/19/2009 09:40  Reported date-time: 09/19/2009 22:00  Referring Physician:    Ordering Physician: Krystyna Cleckley (HOVERMAK)  Specimen Source: S  Source: [name] Order Number: [id number]  Lab site: [facility] [place]      [address]      [place]  [place]  [address]

## 2009-09-19 NOTE — Unmapped (Signed)
Signed by Irena Cords MD on 09/22/2009 at 23:20:47  Patient: Wanda Rowe  Note: All result statuses are Final unless otherwise noted.    Tests: (1) VITAMIN D, 25-HYDROXY, LC/MS/MS (ZOX-09604)   VITAMIN D, 25-[place], TOTAL                              51 ng/mL                    30-100             25-OHD3 indicates both endogenous production and      supplementation. 25-OHD2 is an indicator of exogenous      sources, such as diet or supplementation. Therapy is      based on measurement of Total 25-OHD, with levels       <20 ng/mL indicative of Vitamin D deficiency, while       levels between 20 ng/mL and 30 ng/mL suggest      insufficiency. Optimal levels are > or = 30 ng/mL.    VITAMIN D, 25-[place], D3      20 ng/mL      Reference Range      Not established    VITAMIN D, 25-[place], D2      31 ng/mL      Reference Range      Not established            REPORT COMMENT:      FASTING    Note: An exclamation mark (!) indicates a result that was not dispersed into   the flowsheet.  Document Creation Date: 09/22/2009 5:53 PM  _______________________________________________________________________    (1) Order result status: Final  Collection or observation date-time: 09/19/2009 09:39  Requested date-time:   Receipt date-time: 09/19/2009 09:40  Reported date-time: 09/22/2009 17:00  Referring Physician:    Ordering Physician: Zamari Vea (HOVERMAK)  Specimen Source: S  Source: [name] Order Number: [id number]  Lab site: [facility] [place]      [address]      [place]  [place]  [address]

## 2009-09-19 NOTE — Unmapped (Signed)
Signed by Irena Cords MD on 09/20/2009 at 10:26:34  Patient: Wanda Rowe  Note: All result statuses are Final unless otherwise noted.    Tests: (1) COMPREHENSIVE METABOLIC PANEL (QDL-10231)    GLUCOSE                   99 mg/dL                    16-10                        Fasting reference interval           UREA NITROGEN (BUN)       23 mg/dL                    9-60    CREATININE                0.71 mg/dL                  0.59-1.07   eGFR NON-AFR. AMERICAN                              101 mL/min/1.35m2           > OR = 60   eGFR AFRICAN AMERICAN                              117 mL/min/1.76m2           > OR = 60   BUN/CREATININE RATIO (calc)                              NOT APPLICABLE              6-22    SODIUM                    141 mmol/L                  135-146    POTASSIUM                 4.3 mmol/L                  3.5-5.3    CHLORIDE                  102 mmol/L                  98-110    CARBON DIOXIDE            28 mmol/L                   21-33    CALCIUM                   10.2 mg/dL                  4.5-40.9    PROTEIN, TOTAL            7.3 g/dL                    8.1-1.9    ALBUMIN                   4.7 g/dL  3.6-5.1    GLOBULIN (calc)           2.6 g/dL                    1.6-1.0   ALBUMIN/GLOBULIN RATIO (calc)                              1.8                         1.0-2.1    BILIRUBIN, TOTAL          0.6 mg/dL                   9.6-0.4    ALKALINE PHOSPHATASE      71 U/L                      33-115    AST                       34 U/L                      10-35    ALT                  [H]  53 U/L                      6-40            REPORT COMMENT:      FASTING    Note: An exclamation mark (!) indicates a result that was not dispersed into   the flowsheet.  Document Creation Date: 09/19/2009 10:53 PM  _______________________________________________________________________    (1) Order result status: Final  Collection or observation date-time: 09/19/2009 09:39  Requested  date-time:   Receipt date-time: 09/19/2009 09:40  Reported date-time: 09/19/2009 22:00  Referring Physician:    Ordering Physician: Sadler Teschner (HOVERMAK)  Specimen Source: S  Source: [name] Order Number: [id number]  Lab site: [facility] [place]      [address]      [place]  [facility]  [address]      -----------------    The following non-numeric lab results were dispersed to  the flowsheet even though numeric results were expected:      BUN/CREATININE RATIO (calc), NOT APPLICABLE

## 2009-09-19 NOTE — Unmapped (Signed)
Signed by Irena Cords MD on 09/20/2009 at 10:26:34  Patient: Wanda Rowe  Note: All result statuses are Final unless otherwise noted.    Tests: (1) HEMOGLOBIN A1c (QDL-496)   HEMOGLOBIN A1c (% of total Hgb)                         [H]  6.8 %                       <5.7                      Consistent with diabetes                       <5.7       Decreased risk of diabetes                       5.7-6.0    Increased risk of diabetes                       6.1-6.4    Higher risk of diabetes                       > or = 6.5 Consistent with diabetes                         Standards of Medical Care in Diabetes-2010.                  Diabetes Care, 33(Supp 1): W1-U27,2536.            REPORT COMMENT:      FASTING    Note: An exclamation mark (!) indicates a result that was not dispersed into   the flowsheet.  Document Creation Date: 09/20/2009 2:23 AM  _______________________________________________________________________    (1) Order result status: Final  Collection or observation date-time: 09/19/2009 09:39  Requested date-time:   Receipt date-time: 09/19/2009 09:40  Reported date-time: 09/20/2009 02:00  Referring Physician:    Ordering Physician: Skylarr Liz (HOVERMAK)  Specimen Source: B  Source: [name] Order Number: [id number]  Lab site: [facility] [place]      [address]      [place]  [address]

## 2009-09-19 NOTE — Unmapped (Signed)
Signed by Irena Cords MD on 09/22/2009 at 23:20:47  Patient: Wanda Rowe  Note: All result statuses are Final unless otherwise noted.    Tests: (1) CARDIO CRP(R) (MVH-84696)  ! CARDIO CRP(R)             1.9 mg/L             Average relative cardiovascular risk according to      AHA/CDC guidelines.                    For ages >10 Years:      cCRP mg/L    Risk According to AHA/CDC Guidelines      <1.0         Lower relative cardiovascular risk.      1.0-3.0      Average relative cardiovascular risk.      3.1-10.0     Higher relative cardiovascular risk.                   Consider retesting in 1 to 2 weeks to                   exclude a benign transient elevation                   in the baseline CRP value secondary                   to infection or inflammation.      >10.0        Persistent elevation, upon retesting,                   may be associated with infection and                   inflammation.                   REPORT COMMENT:      FASTING    Note: An exclamation mark (!) indicates a result that was not dispersed into   the flowsheet.  Document Creation Date: 09/20/2009 9:56 AM  _______________________________________________________________________    (1) Order result status: Final  Collection or observation date-time: 09/19/2009 09:39  Requested date-time:   Receipt date-time: 09/19/2009 09:40  Reported date-time: 09/20/2009 09:00  Referring Physician:    Ordering Physician: Jereme Loren (HOVERMAK)  Specimen Source: S  Source: [name] Order Number: [id number]  Lab site: [facility] [place]      [address]      [place]  [place]  [address]

## 2009-09-30 NOTE — Unmapped (Signed)
Signed by Irena Cords MD on 09/30/2009 at 19:09:07      Reason for Visit   Chief Complaint: thyroid check    History from: patient    Allergies  ! SULFA  Allergy and adverse reaction list reviewed during this update.      Medications   SINGULAIR 10 MG TAB (MONTELUKAST SODIUM) one by mouth at bedtime  NEURONTIN 300 MG CAPS (GABAPENTIN) one by mouth tid  FLONASE 50 MCG/ACT SUSPN (FLUTICASONE PROPIONATE (NASAL)) two sprays each nostril at bedtime  ATROVENT 0.03 % SOLN (IPRATROPIUM BROMIDE) 2 sprays bid  VALTREX 500 MG TABS (VALACYCLOVIR HCL) one by mouth dailey  * CALCIUM CITRATE 1 qd  BENICAR HCT 20-12.5 MG TABS (OLMESARTAN MEDOXOMIL-HCTZ) one by mouth daily  SYNTHROID 88 MCG TABS (LEVOTHYROXINE SODIUM) one by mouth daily  * LOVASA 1 GRAM 4 qd  VALTREX 500 MG TABS (VALACYCLOVIR HCL) one by mouth qd  * GLUCOMETER fsbs bid  * LANCETS TEST STRIPS fsbs bid  VITAMIN D 16109 UNIT CAPS (ERGOCALCIFEROL) one by mouth every week  B-12 250 MCG TABS (CYANOCOBALAMIN) 1 qd  LEXAPRO 10 MG TABS (ESCITALOPRAM OXALATE) one by mouth daily  JANUMET 50-1000 MG TABS (SITAGLIPTIN-METFORMIN HCL) 1 bid  * LANCETS, STRIPS FOR GLUCOMETER fsbs bid  OMEPRAZOLE 20 MG CPDR (OMEPRAZOLE) one by mouth daily       Vital Signs:   Ht: 69 in.  Wt: 266 lbs.      BMI: 39.42  BSA: 2.33  Wt chg (lbs): -2  Temperature: 98.6  degrees F  oral  Pulse: 68 (regular)  Resp: 12    Patient appears to be in acute distress: no  BP: 118/74  Cuff size: regular    Intake recorded by: Vance Peper on September 30, 2009 8:43 AM        HPI Multiple Chronic   Additional Dx: f/u  thyroid, chol, htn, elevated transaminases. cant take statins    ROS/Symptoms:   Patient denies any cardiovascular symptoms.  Patient denies any respiratory symptoms.  Patient denies any GI symptoms.  Patient denies any neurologic symptoms.    PAST HISTORY  Past Medical History (reviewed - no changes required):  Hyperlipidemia, Hypertension, Diabetes Type II, Hypothyroidism, GERD, Stomach polyps,  RSD right knee, Anxiety, Mild Asthma, .    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  Respiratory: Respiration un-labored.  Lung fields clear to auscultation.  No wheezing, rales, rhonchi or pleural rub.  Cardiac: S1 and S2 normal.  RRR without murmurs, rubs, gallops.  No JVD.  Vascular: No carotid bruits.  No edema or varicosities.          Preventive Maintenance  Coordinating Care Providers   PCP Name: Dr. Gari Crown             Prescriptions:  JANUMET 50-1000 MG TABS (SITAGLIPTIN-METFORMIN HCL) 1 bid  #60 x 5   Entered and Authorized by: Marisah Laker Johnn Hai MD   Signed by: Irena Cords MD on 09/30/2009   Method used: Print then Give to Patient   RxID: [id number]  LEXAPRO 10 MG TABS (ESCITALOPRAM OXALATE) one by mouth daily  #30 x 5   Entered and Authorized by: Junette Bernat Johnn Hai MD   Signed by: Irena Cords MD on 09/30/2009   Method used: Print then Give to Patient   RxID: [id number]  VITAMIN D 84696 UNIT CAPS (ERGOCALCIFEROL) one by mouth every week  #4 x 5   Entered and Authorized by: Broly Hatfield Johnn Hai MD   Signed by: Irena Cords MD on 09/30/2009   Method used: Print then Give to Patient   RxID: [id number]  LOVASA 1 GRAM 4 qd  #360 x 5   Entered and Authorized  by: Rahcel Shutes Johnn Hai MD   Signed by: Irena Cords MD on 09/30/2009   Method used: Print then Give to Patient   RxID: [id number]  SYNTHROID 88 MCG TABS (LEVOTHYROXINE SODIUM) one by mouth daily  #30 x 5   Entered and Authorized by: Tondalaya Perren Johnn Hai MD   Signed by: Irena Cords MD on 09/30/2009   Method used: Print then Give to Patient   RxID: [id number]  ATROVENT 0.03 % SOLN (IPRATROPIUM BROMIDE) 2 sprays bid  #1 x 5   Entered and Authorized by: Likisha Alles Johnn Hai MD   Signed by: Irena Cords MD on 09/30/2009   Method used: Print then Give to Patient   RxID: [id number]  FLONASE 50 MCG/ACT SUSPN (FLUTICASONE PROPIONATE (NASAL)) two sprays each nostril at bedtime  #1 x 5   Entered and Authorized by: Necie Wilcoxson Johnn Hai MD   Signed by: Irena Cords MD on 09/30/2009   Method used: Print then Give to Patient   RxID: [id number]  NEURONTIN 300 MG CAPS (GABAPENTIN) one by mouth tid  #90 x 5   Entered and Authorized by: Grabiela Wohlford Johnn Hai MD   Signed by: Irena Cords MD on 09/30/2009   Method used: Print then Give to Patient   RxID: [id number]  SINGULAIR 10 MG TAB (MONTELUKAST SODIUM) one by mouth at bedtime  #30 x 5   Entered and Authorized by: Kileen Lange Johnn Hai MD   Signed by: Irena Cords MD on 09/30/2009   Method used: Print then Give to Patient   RxID: [id number]      Assessment and Plan     Problems   Status of Existing Problems:  Assessed TRANSAMINASES, SERUM, ELEVATED as improved - off statin - Jasten Guyette K Adin Lariccia MD  Assessed VITAMIN D DEFICIENCY as improved - continue current medications - Ramses Klecka K Anayiah Howden MD  Assessed HYPOTHYROIDISM as deteriorated - more compliance with taking meds - Shirl Weir K Noah Pelaez MD  Assessed HYPOTHYROIDISM as deteriorated - Jamekia Gannett K Geselle Cardosa MD  Assessed ASTHMA as improved - continue current medications - Alejandra Barna K Moody Robben MD  Assessed DIABETES MELLITUS, TYPE II as improved - continue current medications - Valyn Latchford K Hanson Medeiros MD  Assessed HYPERTRIGLYCERIDEMIA as  improved - continue current medications - Meade Hogeland K Katy Brickell MD    Medications   New Prescriptions/Refills:  JANUMET 50-1000 MG TABS (SITAGLIPTIN-METFORMIN HCL) 1 bid  #60 x 5, 09/30/2009, Iverna Hammac K Danta Baumgardner MD  LEXAPRO 10 MG TABS (ESCITALOPRAM OXALATE) one by mouth daily  #30 x 5, 09/30/2009, Raziya Aveni K Aliyyah Riese MD  VITAMIN D 23557 UNIT CAPS (ERGOCALCIFEROL) one by mouth every week  #4 x 5, 09/30/2009, Azlaan Isidore K Berania Peedin MD  LOVASA 1 GRAM 4 qd  #360 x 5, 09/30/2009, Kashena Novitski K Denorris Reust MD  SYNTHROID 88 MCG TABS (LEVOTHYROXINE SODIUM) one by mouth daily  #30 x 5, 09/30/2009, Eugean Arnott K Junelle Hashemi MD  ATROVENT 0.03 % SOLN (IPRATROPIUM BROMIDE) 2 sprays bid  #1 x 5, 09/30/2009, Maylene Crocker K Tarrin Lebow MD  FLONASE 50 MCG/ACT SUSPN (FLUTICASONE PROPIONATE (NASAL)) two sprays each nostril at bedtime  #1 x 5, 09/30/2009, Skye Rodarte K Subhan Hoopes MD  NEURONTIN 300 MG CAPS (GABAPENTIN) one by mouth tid  #90 x 5, 09/30/2009, Aryelle Figg K Lateefa Crosby MD  SINGULAIR 10 MG TAB (MONTELUKAST SODIUM) one by mouth at bedtime  #30 x 5, 09/30/2009, Zailynn Brandel K Aunika Kirsten MD    Today's Orders   Apolipoprotein B (5224) [IMS-11111]  CMP   (METAPNL) (10231) [CPT-80053]  Lipid Profile   (FATS) (7600) [CPT-80061]  Hemoglobin  A1C   (GLYCO) (496)  [CPT-83036]  TSH   (TSH) (899) [CPT-84443]  Vitamin D, 25-Hydrodroxy  (VITMD25) (17306) [CPT-82306]  99214 - Ofc Vst, Est Level IV Maela.Pennant                ]

## 2009-12-25 LAB — COMPREHENSIVE METABOLIC PANEL
A/G Ratio: 1.6 (ref 1.0–2.1)
ALT: 69 units/L (ref 6–40)
AST: 60 units/L (ref 10–35)
Albumin: 4.4 g/dL (ref 3.6–5.1)
Alkaline Phosphatase: 75 units/L (ref 33–115)
BUN: 14 mg/dL (ref 7–25)
CO2: 26 mmol/L (ref 21–33)
Calcium: 10.2 mg/dL (ref 8.6–10.2)
Chloride: 102 mmol/L (ref 98–110)
Creatinine: 0.65 mg/dL (ref 0.59–1.07)
GFR MDRD Af Amer: 121 mL/min (ref >=60)
GFR MDRD Non Af Amer: 104 mL/min (ref >=60)
Globulin, Total: 2.7 g/dL (ref 2.2–3.9)
Glucose: 133 mg/dL (ref 65–99)
Potassium: 4.6 mmol/L (ref 3.5–5.3)
Sodium: 140 mmol/L (ref 135–146)
Total Bilirubin: 0.3 mg/dL (ref 0.2–1.2)
Total Protein: 7.1 g/dL (ref 6.2–8.3)

## 2009-12-25 LAB — APOLIPOPROTEIN B: Apolipoprotein B: 111 mg/dL (ref 49–103)

## 2009-12-25 LAB — LIPID PANEL
Chol/HDL Ratio: 5.1 (ref <=5.0)
Cholesterol, Total: 198 mg/dL (ref 125–200)
HDL: 39 mg/dL (ref >=46)
LDL Cholesterol: 109 mg/dL (ref <130)
Triglycerides: 251 mg/dL (ref <150)

## 2009-12-25 LAB — VITAMIN D 25 HYDROXY
Vit D, 25-Hydroxy: 52 ng/mL (ref 30–100)
Vitamin D2, 1,25 (~~LOC~~)2: 38 ng/mL
Vitamin D3, 1,25 (~~LOC~~)2: 14 ng/mL

## 2009-12-25 LAB — HEMOGLOBIN A1C: Hemoglobin A1C: 6.6 % — ABNORMAL HIGH (ref <5.7)

## 2009-12-25 LAB — TSH: TSH: 1.48 u[IU]/mL

## 2009-12-25 NOTE — Unmapped (Signed)
Signed by Irena Cords MD on 12/28/2009 at 21:08:47  Patient: Wanda Rowe  Note: All result statuses are Final unless otherwise noted.    Tests: (1) TSH, 3RD GENERATION (QDL-899)    TSH, 3RD GENERATION       1.48 mIU/L      Reference Range              > or = 20 Years  0.40-4.50                   Pregnancy Ranges       First trimester    0.20-4.70       Second trimester   0.30-4.10       Third trimester    0.40-2.70    Note: An exclamation mark (!) indicates a result that was not dispersed into   the flowsheet.  Document Creation Date: 12/26/2009 1:50 AM  _______________________________________________________________________    (1) Order result status: Final  Collection or observation date-time: 12/25/2009 09:57  Requested date-time:   Receipt date-time: 12/25/2009 10:02  Reported date-time: 12/26/2009 01:00  Referring Physician:    Ordering Physician: Ricca Melgarejo (HOVERMAK)  Specimen Source: S  Source: [name] Order Number: [id number]  Lab site: [facility] [place]      [address]      [place]  [place]  [address]

## 2009-12-25 NOTE — Unmapped (Signed)
Signed by Irena Cords MD on 12/31/2009 at 08:22:30  Patient: Wanda Rowe  Note: All result statuses are Final unless otherwise noted.    Tests: (1) APOLIPOPROTEIN B (QDL-5224)    APOLIPOPROTEIN B     [H]  111 mg/dL                   16-109    Note: An exclamation mark (!) indicates a result that was not dispersed into   the flowsheet.  Document Creation Date: 12/31/2009 6:20 AM  _______________________________________________________________________    (1) Order result status: Final  Collection or observation date-time: 12/25/2009 09:57  Requested date-time:   Receipt date-time: 12/25/2009 10:02  Reported date-time: 12/31/2009 06:00  Referring Physician:    Ordering Physician: Kieara Schwark (HOVERMAK)  Specimen Source: S  Source: [name] Order Number: [id number]  Lab site: [facility] [place]      [address]      [place]  [place]  [address]

## 2009-12-25 NOTE — Unmapped (Signed)
Signed by Irena Cords MD on 12/28/2009 at 21:08:47  Patient: Wanda Rowe  Note: All result statuses are Final unless otherwise noted.    Tests: (1) LIPID PANEL (QDL-7600)    CHOLESTEROL, TOTAL        198 mg/dL                   884-166    HDL CHOLESTEROL      [L]  39 mg/dL                    > OR = 46    TRIGLYCERIDES        [H]  251 mg/dL                   <063   LDL-CHOLESTEROL (calc)                              109 mg/dL                   <016             Desirable range <100 mg/dL for patients with CHD or      diabetes and <70 mg/dL for diabetic patients with      known heart disease.          CHOL/HDLC RATIO (calc)                         [H]  5.1                         < OR = 5.0    Note: An exclamation mark (!) indicates a result that was not dispersed into   the flowsheet.  Document Creation Date: 12/26/2009 1:06 AM  _______________________________________________________________________    (1) Order result status: Final  Collection or observation date-time: 12/25/2009 09:57  Requested date-time:   Receipt date-time: 12/25/2009 10:02  Reported date-time: 12/26/2009 00:00  Referring Physician:    Ordering Physician: Suheily Birks (HOVERMAK)  Specimen Source: S  Source: [name] Order Number: [id number]  Lab site: [facility] [place]      [address]      [place]  [place]  [phone]

## 2009-12-25 NOTE — Unmapped (Signed)
Signed by Irena Cords MD on 12/28/2009 at 21:08:47  Patient: Wanda Rowe  Note: All result statuses are Final unless otherwise noted.    Tests: (1) COMPREHENSIVE METABOLIC PANEL (QDL-10231)    GLUCOSE              [H]  133 mg/dL                   16-10                        Fasting reference interval           UREA NITROGEN (BUN)       14 mg/dL                    9-60    CREATININE                0.65 mg/dL                  0.59-1.07   eGFR NON-AFR. AMERICAN                              104 mL/min/1.76m2           > OR = 60   eGFR AFRICAN AMERICAN                              121 mL/min/1.53m2           > OR = 60   BUN/CREATININE RATIO (calc)                              NOT APPLICABLE              6-22    SODIUM                    140 mmol/L                  135-146    POTASSIUM                 4.6 mmol/L                  3.5-5.3    CHLORIDE                  102 mmol/L                  98-110    CARBON DIOXIDE            26 mmol/L                   21-33    CALCIUM                   10.2 mg/dL                  4.5-40.9    PROTEIN, TOTAL            7.1 g/dL                    8.1-1.9    ALBUMIN                   4.4 g/dL  3.6-5.1    GLOBULIN (calc)           2.7 g/dL                    1.0-2.7   ALBUMIN/GLOBULIN RATIO (calc)                              1.6                         1.0-2.1    BILIRUBIN, TOTAL          0.3 mg/dL                   2.5-3.6    ALKALINE PHOSPHATASE      75 U/L                      33-115    AST                  [H]  60 U/L                      10-35    ALT                  [H]  69 U/L                      6-40    Note: An exclamation mark (!) indicates a result that was not dispersed into   the flowsheet.  Document Creation Date: 12/26/2009 1:06 AM  _______________________________________________________________________    (1) Order result status: Final  Collection or observation date-time: 12/25/2009 09:57  Requested date-time:   Receipt date-time: 12/25/2009  10:02  Reported date-time: 12/26/2009 00:00  Referring Physician:    Ordering Physician: Nori Poland (HOVERMAK)  Specimen Source: S  Source: [name] Order Number: [id number]  Lab site: [facility] [place]      [address]      [place]  [facility]  [address]      -----------------    The following non-numeric lab results were dispersed to  the flowsheet even though numeric results were expected:      BUN/CREATININE RATIO (calc), NOT APPLICABLE

## 2009-12-25 NOTE — Unmapped (Signed)
Signed by Irena Cords MD on 12/29/2009 at 07:54:18  Patient: Wanda Rowe  Note: All result statuses are Final unless otherwise noted.    Tests: (1) VITAMIN D, 25-HYDROXY, LC/MS/MS (ZOX-09604)   VITAMIN D, 25-[place], TOTAL                              52 ng/mL                    30-100             25-OHD3 indicates both endogenous production and      supplementation. 25-OHD2 is an indicator of exogenous      sources, such as diet or supplementation. Therapy is      based on measurement of Total 25-OHD, with levels       <20 ng/mL indicative of Vitamin D deficiency, while       levels between 20 ng/mL and 30 ng/mL suggest      insufficiency. Optimal levels are > or = 30 ng/mL.    VITAMIN D, 25-[place], D3      14 ng/mL      Reference Range      Not established    VITAMIN D, 25-[place], D2      38 ng/mL      Reference Range      Not established    Note: An exclamation mark (!) indicates a result that was not dispersed into   the flowsheet.  Document Creation Date: 12/28/2009 11:00 PM  _______________________________________________________________________    (1) Order result status: Final  Collection or observation date-time: 12/25/2009 09:57  Requested date-time:   Receipt date-time: 12/25/2009 10:02  Reported date-time: 12/28/2009 22:00  Referring Physician:    Ordering Physician: Sofiya Ezelle (HOVERMAK)  Specimen Source: S  Source: [name] Order Number: [id number]  Lab site: [facility] [place]      [address]      [place]  [place]  [address]

## 2009-12-25 NOTE — Unmapped (Signed)
Signed by Irena Cords MD on 12/28/2009 at 21:08:47  Patient: Wanda Rowe  Note: All result statuses are Final unless otherwise noted.    Tests: (1) HEMOGLOBIN A1c (QDL-496)   HEMOGLOBIN A1c (% of total Hgb)                         [H]  6.6 %                       <5.7                      Consistent with diabetes                       <5.7       Decreased risk of diabetes                       5.7-6.0    Increased risk of diabetes                       6.1-6.4    Higher risk of diabetes                       > or = 6.5 Consistent with diabetes                         Standards of Medical Care in Diabetes-2010.                  Diabetes Care, 33(Supp 1): Z6-S06,3016.    Note: An exclamation mark (!) indicates a result that was not dispersed into   the flowsheet.  Document Creation Date: 12/26/2009 1:06 AM  _______________________________________________________________________    (1) Order result status: Final  Collection or observation date-time: 12/25/2009 09:57  Requested date-time:   Receipt date-time: 12/25/2009 10:02  Reported date-time: 12/26/2009 00:00  Referring Physician:    Ordering Physician: Jacquiline Zurcher (HOVERMAK)  Specimen Source: B  Source: [name] Order Number: [id number]  Lab site: [facility] [place]      [address]      [place]  [place]  [address]

## 2009-12-31 NOTE — Unmapped (Signed)
Signed by Irena Cords MD on 12/31/2009 at 10:59:14      Reason for Visit   Chief Complaint: Lab Review    History from: patient    Allergies  ! SULFA  ! ZITHROMAX (AZITHROMYCIN TABS)  Allergy and adverse reaction list reviewed during this update.      Medications   OTC Meds Reviewed  Medication list reviewed during this update. Current Meds:   SINGULAIR 10 MG TAB (MONTELUKAST SODIUM) one by mouth at bedtime  NEURONTIN 300 MG CAPS (GABAPENTIN) one by mouth tid  FLONASE 50 MCG/ACT SUSPN (FLUTICASONE PROPIONATE (NASAL)) two sprays each nostril at bedtime  ATROVENT 0.03 % SOLN (IPRATROPIUM BROMIDE) 2 sprays bid  VALTREX 500 MG TABS (VALACYCLOVIR HCL) one by mouth dailey  * CALCIUM CITRATE 1 qd  BENICAR HCT 20-12.5 MG TABS (OLMESARTAN MEDOXOMIL-HCTZ) one by mouth daily  SYNTHROID 88 MCG TABS (LEVOTHYROXINE SODIUM) one by mouth daily  * LOVASA 1 GRAM 4 qd  VALTREX 500 MG TABS (VALACYCLOVIR HCL) one by mouth qd  * GLUCOMETER fsbs bid  * LANCETS TEST STRIPS fsbs bid  VITAMIN D 16109 UNIT CAPS (ERGOCALCIFEROL) one by mouth every week  B-12 250 MCG TABS (CYANOCOBALAMIN) 1 qd  LEXAPRO 10 MG TABS (ESCITALOPRAM OXALATE) one by mouth daily  JANUMET 50-1000 MG TABS (SITAGLIPTIN-METFORMIN HCL) 1 bid  * LANCETS, STRIPS FOR GLUCOMETER fsbs bid  OMEPRAZOLE 20 MG CPDR (OMEPRAZOLE) one by mouth daily  AUGMENTIN 875-125 MG TABS (AMOXICILLIN-POT CLAVULANATE) one by mouth twice daily          Vital Signs:   Ht: 69 in.  Wt: 270 lbs.      BMI: 40.02  BSA: 2.35  Wt chg (lbs): 4  Temperature: 98.5  degrees F  oral  Pulse: 68 (regular)  Resp: 12    Patient appears to be in acute distress: no  BP: 122/82  Cuff size: large    Intake recorded by: Vance Peper on December 31, 2009 8:05 AM        HPI Multiple Chronic   Additional Dx: started on zpak then augmentin for sinus od now better but getting severe diarrhea. also c/o intermittant diarrhea for a while before this. on and off for a few mos. not related to any specific food. ? if gluten  is a problem. also f/u htn, dm2, depression, vit b12, vit d    ROS/Symptoms:   Patient denies any endocrine symptoms.  Patient denies any cardiovascular symptoms.  Patient denies any respiratory symptoms.    PAST HISTORY  Past Medical History (reviewed - no changes required):  Hyperlipidemia, Hypertension, Diabetes Type II, Hypothyroidism, GERD, Stomach polyps, RSD right knee, Anxiety, Mild Asthma, .    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  Ears: l tm with slight erythema  Respiratory: Respiration un-labored.  Lung fields clear to auscultation.  No wheezing, rales, rhonchi or pleural rub.  Cardiac: S1 and S2 normal.  RRR without murmurs, rubs, gallops.  No JVD.  Vascular: No carotid bruits.  No edema or varicosities.  Abdomen: hyperactive bs  Psychiatric: Judgement and insight are within normal limits.  Alert and oriented x3.  No mood disorders noted, appropriate affect.           New Problems:  DIARRHEA, ACUTE (ICD-787.91)  New Medications:  METFORMIN HCL 1000 MG TABS (METFORMIN HCL) 1 bid  AUGMENTIN 875-125 MG TABS (AMOXICILLIN-POT CLAVULANATE) one by mouth twice daily  VICTOZA  SOLN (LIRAGLUTIDE SOLN) 0.6 mg daily x 1 week then 1.2 mg/day and  needles  New Allergies:  ! ZITHROMAX  (AZITHROMYCIN TABS)    Preventive Maintenance       Coordinating Care Providers   PCP Name: Dr. Gari Crown             Prescriptions:  LEXAPRO 10 MG TABS (ESCITALOPRAM OXALATE) one by mouth daily  #30 x 5   Entered and Authorized by: Rashawnda Gaba Johnn Hai MD   Signed by: Irena Cords MD on 12/31/2009   Method used: Print then Give to Patient   RxID: [id number]  VITAMIN D 61607 UNIT CAPS (ERGOCALCIFEROL) one by mouth every week  #4 x 5   Entered and Authorized by: Janese Radabaugh Johnn Hai MD   Signed by: Irena Cords MD on 12/31/2009   Method used: Print then Give to Patient   RxID: [id number]  LANCETS TEST STRIPS fsbs bid  #100 x 1   Entered and Authorized by: Monterio Bob Johnn Hai MD   Signed by: Irena Cords MD on 12/31/2009   Method used: Print then Give to Patient   RxID: [id number]  SYNTHROID 88 MCG TABS (LEVOTHYROXINE SODIUM) one by mouth daily  #30 x 5   Entered and Authorized by: Wren Pryce Johnn Hai MD   Signed by: Irena Cords MD on 12/31/2009   Method used: Print then Give to Patient   RxID: [id number]  BENICAR HCT 20-12.5 MG TABS (OLMESARTAN MEDOXOMIL-HCTZ) one by mouth daily  #30 x 5   Entered and Authorized by: Sya Nestler Johnn Hai MD   Signed by: Irena Cords MD on 12/31/2009   Method used: Print then Give to Patient   RxID: [id number]  VALTREX 500 MG TABS (VALACYCLOVIR HCL) one by mouth dailey  #30 x 5   Entered and Authorized by: Koleen Celia Johnn Hai MD   Signed by: Irena Cords MD on 12/31/2009   Method used: Print then Give to Patient   RxID: [id number]  ATROVENT 0.03 % SOLN (IPRATROPIUM BROMIDE) 2 sprays bid  #1 x 5   Entered and Authorized by: Zakiah Beckerman Johnn Hai MD   Signed by: Irena Cords MD on 12/31/2009   Method used: Print then Give to Patient   RxID: [id number]  FLONASE 50 MCG/ACT SUSPN (FLUTICASONE PROPIONATE (NASAL)) two sprays each nostril at bedtime  #1 x 5   Entered and Authorized by: Laquincy Eastridge Johnn Hai MD   Signed by: Irena Cords MD on 12/31/2009   Method  used: Print then Give to Patient   RxID: [id number]  NEURONTIN 300 MG CAPS (GABAPENTIN) one by mouth tid  #90 x 5   Entered and Authorized by: Trayden Brandy Johnn Hai MD  Signed by: Irena Cords MD on 12/31/2009   Method used: Print then Give to Patient   RxID: [id number]  SINGULAIR 10 MG TAB (MONTELUKAST SODIUM) one by mouth at bedtime  #30 x 5   Entered and Authorized by: Skylynne Schlechter Johnn Hai MD   Signed by: Irena Cords MD on 12/31/2009   Method used: Print then Give to Patient   RxID: [id number]  VICTOZA  SOLN (LIRAGLUTIDE SOLN) 0.6 mg daily x 1 week then 1.2 mg/day and  needles  #1 mo x 3   Entered and Authorized by: Ebb Carelock Johnn Hai MD   Signed by: Irena Cords MD on 12/31/2009   Method used: Print then Give to Patient   RxID: [id number]  METFORMIN HCL 1000 MG TABS (METFORMIN HCL) 1 bid  #60 x 3   Entered and Authorized by: Brantlee Penn Johnn Hai MD   Signed by: Irena Cords MD on 12/31/2009   Method used: Print then Give to Patient   RxID: [id number]      Assessment and Plan     Problems   Status of Existing Problems:  Assessed DIARRHEA, ACUTE as deteriorated - check for c diff, start probiotic - Bera Pinela K Tekoa Hamor MD  Assessed TRANSAMINASES, SERUM, ELEVATED as unchanged - monitor may need [place] - Tayonna Bacha K Deshanti Adcox MD  Assessed SINUSITIS, ACUTE as improved - finisg augmentin - Jillyn Stacey K Aarianna Hoadley MD  Assessed HYPOTHYROIDISM as improved - continue current medications - Seiya Silsby K Jennah Satchell MD  Assessed DIABETES MELLITUS, TYPE II as improved - switch to victoza worried jhanuvia assocoated with cancer - Deyonna Fitzsimmons K Jerret Mcbane MD  Assessed ASTHMA as improved - continue current medications - Jaray Boliver K Linzie Criss MD  Assessed HYPERCHOLESTEROLEMIA as improved - continue current medications - Aiana Nordquist K Zoeann Mol MD  Assessed HYPERTRIGLYCERIDEMIA as deteriorated - having diarrhea on and off before abx work on wtt loss with victoza - Reathel Turi K Jacques Fife MD  Assessed ANXIETY as improved - continue current medications - Mio Schellinger K Tamberlyn Midgley  MD  Assessed HYPERTENSION as improved - continue current medications - Jasilyn Holderman K Aaliyha Mumford MD  New Problems:  Dx of DIARRHEA, ACUTE (ICD-787.91)  Onset: 12/31/2009    Medications   New Prescriptions/Refills:  LEXAPRO 10 MG TABS (ESCITALOPRAM OXALATE) one by mouth daily  #30 x 5, 12/31/2009, Orlandus Borowski K Zadyn Yardley MD  VITAMIN D 42595 UNIT CAPS (ERGOCALCIFEROL) one by mouth every week  #4 x 5, 12/31/2009, Atharv Barriere K Delia Slatten MD  LANCETS TEST STRIPS fsbs bid  #100 x 1, 12/31/2009, Naylin Burkle K Consuelo Thayne MD  SYNTHROID 88 MCG TABS (LEVOTHYROXINE SODIUM) one by mouth daily  #30 x 5, 12/31/2009, Lynnda Wiersma K Traeger Sultana MD  BENICAR HCT 20-12.5 MG TABS (OLMESARTAN MEDOXOMIL-HCTZ) one by mouth daily  #30 x 5, 12/31/2009, Asani Deniston K Abcde Oneil MD  VALTREX 500 MG TABS (VALACYCLOVIR HCL) one by mouth dailey  #30 x 5, 12/31/2009, Corney Knighton K Tynesha Free MD  ATROVENT 0.03 % SOLN (IPRATROPIUM BROMIDE) 2 sprays bid  #1 x 5, 12/31/2009, Muslima Toppins K Katalina Magri MD  FLONASE 50 MCG/ACT SUSPN (FLUTICASONE PROPIONATE (NASAL)) two sprays each nostril at bedtime  #1 x 5, 12/31/2009, Dimitrius Steedman K Shantese Raven MD  NEURONTIN 300 MG CAPS (GABAPENTIN) one by mouth tid  #90 x 5, 12/31/2009, Kristina Mcnorton K Candace Begue MD  SINGULAIR 10 MG TAB (MONTELUKAST SODIUM) one by mouth at bedtime  #30 x 5, 12/31/2009, Shatima Zalar K Maurisio Ruddy MD  VICTOZA  SOLN (LIRAGLUTIDE SOLN) 0.6 mg daily x 1 week then 1.2 mg/day and  needles  #1 mo x 3,  12/31/2009, Imaad Reuss K Megon Kalina MD  METFORMIN HCL 1000 MG TABS (METFORMIN HCL) 1 bid  #60 x 3, 12/31/2009, Kahlea Cobert K Nomi Rudnicki MD    Today's Orders   C. Diff Toxin A&B  (88416) [*CPT-87324]  CMP   (METAPNL) (10231) [CPT-80053]  Lipid Profile   (FATS) (7600) [CPT-80061]  Hemoglobin  A1C   (GLYCO) (496)  [CPT-83036]  TSH   (TSH) (899) [SAY-30160]  99214 - Ofc Vst, Est Level IV Maela.Pennant                ]

## 2009-12-31 NOTE — Unmapped (Signed)
Signed by Irena Cords MD on 12/31/2009 at 00:00:00  No Show Policy      Imported By: Vance Peper 01/21/2010 11:39:42    _____________________________________________________________________    External Attachment:    Please see [facility] EMR for this document.

## 2010-01-01 NOTE — Unmapped (Signed)
Signed by Irena Cords MD on 01/07/2010 at 03:17:55  Patient: Wanda Rowe  Note: All result statuses are Final unless otherwise noted.    Tests: (1) CLOSTRIDIUM DIFFICILE TOXIN A AND B, EIA (IRJ-18841)  ! CLOSTRIDIUM DIFFICILE TOXIN A AND B, EIA                              .              CLOSTRIDIUM DIFFICILE TOXIN A AND B, EIA                 MICRO NUMBER:      [id number]        TEST STATUS:       FINAL        SPECIMEN SOURCE:   STOOL        SPECIMEN QUALITY:  ADEQUATE        RESULT:            Not Detected    Note: An exclamation mark (!) indicates a result that was not dispersed into   the flowsheet.  Document Creation Date: 01/02/2010 6:25 AM  _______________________________________________________________________    (1) Order result status: Final  Collection or observation date-time: 01/01/2010 13:12  Requested date-time:   Receipt date-time: 01/01/2010 13:13  Reported date-time: 01/02/2010 06:00  Referring Physician:    Ordering Physician: Kymoni Monday (HOVERMAK)  Specimen Source: T  Source: [name] Order Number: [id number]  Lab site: [facility] [place]      [address]      [place]  [place]  [address]

## 2010-01-07 NOTE — Unmapped (Signed)
Signed by Vance Peper on 01/09/2010 at 10:46:05         Follow-up for Test Results:   Comments: notify c diff came back negative check if diarrhea any better if not see gi dr Lewis Shock  Follow-up by: Irena Cords MD,  January 07, 2010 3:19 AM    Additional Follow-up for Test Results:   Action Taken: Left patient message on answering machine  Comments: LMOM...when she calls back please give above info.  Additional Follow-up by: Vance Peper,  January 07, 2010 9:03 AM    Additional Follow-up for Test Results:   Action Taken: Left Patient Message on Answering Machine  Comments: X 2  Additional Follow-up by: Vance Peper,  January 08, 2010 8:44 AM    X 3  ...................................................................Vance Peper  January 09, 2010 8:21 AM         LETTER SENT  ...................................................................Vance Peper  January 09, 2010 10:45 AM

## 2010-01-09 NOTE — Unmapped (Signed)
Signed by Vance Peper on 01/09/2010 at 10:46:35                   [facility]         [facility]         [address], [address]         [place], [place] [address]         p [phone] f [phone]              January 09, 2010      [name]  [address]  [place], [place]  [address]      Dear  Ms. Wanda Rowe,    Your physician has been unable to reach you to discuss your care.  It is very important that you contact our office as soon as possible.  Please call our office.    Thank you.          Gari Crown, MD

## 2010-01-14 NOTE — Unmapped (Signed)
Signed by Nonie Hoyer MA on 01/14/2010 at 12:24:06    PHONE NOTE  Caller's Cell Phone #: [phone]  Caller: patient  Department: IM - General  Call for: [facility]     Reason for Call: Pt called back and her diarrhea is better- just to let you know - if you need her for anything else call her       Initial call taken by: Avanell Shackleton,  January 14, 2010 11:32 AM    Current Allergies:   ! SULFA  ! ZITHROMAX (AZITHROMYCIN TABS)

## 2010-01-21 NOTE — Unmapped (Signed)
Signed by Fuller Song on 01/24/2010 at 14:20:45    Phone Note   Outgoing Call  Call placed by: Fuller Song,  January 21, 2010 4:08 PM  Call placed to: Patient  Summary of call: Called surveillance pt to schedule EGD/Colo wMac dx.polyps, fam hx of colon cancer with Dr. Mickie Bail.  She wants to schedule in January 2012 and will need to work on transportation.  She has my direct# and is calling me back.    Follow-up for Phone Call   Sending letter.  Phone Call Completed  Follow-up by: Fuller Song,  January 24, 2010 2:20 PM

## 2010-01-24 NOTE — Unmapped (Signed)
Signed by Fuller Song on 01/24/2010 at [facility]                 [facility]         Digestive Diseases         [address], [address]         [place], [place] [address]         p [phone] f [phone]         [email or web address]    January 24, 2010        Rynn S Blasco  [address]  [place], [place]  [address]    Dear Ms. Jellison:      Per our records, you are due to have repeat endoscopy with [place].    Specifically, you are due for an EGD and Colonoscopy.    This test is recommended for follow up of your known polyps, colon polyps, and family history of colon cancer.    Please call our office at [phone] so we can schedule this with you, and we can certainly answer any questions.    Thank you.      Sincerely,      Lurene Shadow, MD  Associate Professor of Medicine, Digestive Diseases

## 2010-03-25 LAB — COMPREHENSIVE METABOLIC PANEL
A/G Ratio: 1.7 (ref 1.0–2.1)
ALT: 62 units/L (ref 6–40)
AST: 54 units/L (ref 10–35)
Albumin: 4.6 g/dL (ref 3.6–5.1)
Alkaline Phosphatase: 74 units/L (ref 33–115)
BUN: 15 mg/dL (ref 7–25)
CO2: 24 mmol/L (ref 21–33)
Calcium: 9.7 mg/dL (ref 8.6–10.2)
Chloride: 101 mmol/L (ref 98–110)
Creatinine: 0.66 mg/dL (ref 0.50–1.10)
GFR MDRD Af Amer: 120 mL/min (ref >=60)
GFR MDRD Non Af Amer: 104 mL/min (ref >=60)
Globulin, Total: 2.7 g/dL (ref 2.2–3.9)
Glucose: 122 mg/dL (ref 65–99)
Potassium: 4.1 mmol/L (ref 3.5–5.3)
Sodium: 139 mmol/L (ref 135–146)
Total Bilirubin: 0.5 mg/dL (ref 0.2–1.2)
Total Protein: 7.3 g/dL (ref 6.2–8.3)

## 2010-03-25 LAB — HEMOGLOBIN A1C: Hemoglobin A1C: 5.9 % — ABNORMAL HIGH (ref <5.7)

## 2010-03-25 LAB — LIPID PANEL
Chol/HDL Ratio: 4.9 (ref <=5.0)
Cholesterol, Total: 225 mg/dL (ref 125–200)
HDL: 46 mg/dL (ref >=46)
LDL Cholesterol: 142 mg/dL (ref <130)
Triglycerides: 184 mg/dL (ref <150)

## 2010-03-25 LAB — TSH: TSH: 1.09 u[IU]/mL

## 2010-03-25 NOTE — Unmapped (Signed)
Signed by Irena Cords MD on 03/26/2010 at 08:42:39  Patient: Wanda Rowe  Note: All result statuses are Final unless otherwise noted.    Tests: (1) COMPREHENSIVE METABOLIC PANEL (QDL-10231)    GLUCOSE              [H]  122 mg/dL                   40-34                        Fasting reference interval           UREA NITROGEN (BUN)       15 mg/dL                    7-42    CREATININE                0.66 mg/dL                  0.50-1.10   eGFR NON-AFR. AMERICAN                              104 mL/min/1.101m2           > OR = 60   eGFR AFRICAN AMERICAN                              120 mL/min/1.41m2           > OR = 60   BUN/CREATININE RATIO (calc)                              NOT APPLICABLE              6-22    SODIUM                    139 mmol/L                  135-146    POTASSIUM                 4.1 mmol/L                  3.5-5.3    CHLORIDE                  101 mmol/L                  98-110    CARBON DIOXIDE            24 mmol/L                   21-33    CALCIUM                   9.7 mg/dL                   5.9-56.3    PROTEIN, TOTAL            7.3 g/dL                    8.7-5.6    ALBUMIN                   4.6 g/dL  3.6-5.1    GLOBULIN (calc)           2.7 g/dL                    1.6-1.0   ALBUMIN/GLOBULIN RATIO (calc)                              1.7                         1.0-2.1    BILIRUBIN, TOTAL          0.5 mg/dL                   9.6-0.4    ALKALINE PHOSPHATASE      74 U/L                      33-115    AST                  [H]  54 U/L                      10-35    ALT                  [H]  62 U/L                      6-40            REPORT COMMENT:      FASTING    Note: An exclamation mark (!) indicates a result that was not dispersed into   the flowsheet.  Document Creation Date: 03/26/2010 12:29 AM  _______________________________________________________________________    (1) Order result status: Final  Collection or observation date-time: 03/25/2010 08:23  Requested  date-time:   Receipt date-time: 03/25/2010 08:24  Reported date-time: 03/26/2010 00:00  Referring Physician:    Ordering Physician: Avonte Sensabaugh (HOVERMAK)  Specimen Source: S  Source: [name] Order Number: [id number] [id number]  Lab site: [facility] [place]      [address]      [place]  [facility]  [address]      -----------------    The following non-numeric lab results were dispersed to  the flowsheet even though numeric results were expected:      BUN/CREATININE RATIO (calc), NOT APPLICABLE

## 2010-03-25 NOTE — Unmapped (Signed)
Signed by Irena Cords MD on 03/26/2010 at 08:42:39  Patient: Wanda Rowe  Note: All result statuses are Final unless otherwise noted.    Tests: (1) HEMOGLOBIN A1c (QDL-496)   HEMOGLOBIN A1c (% of total Hgb)                         [H]  5.9 %                       <5.7                      Increased risk of diabetes                       <5.7       Decreased risk of diabetes                       5.7-6.0    Increased risk of diabetes                       6.1-6.4    Higher risk of diabetes                       > or = 6.5 Consistent with diabetes                         Standards of Medical Care in Diabetes-2010.                  Diabetes Care, 33(Supp 1): Z6-X09,6045.            REPORT COMMENT:      FASTING    Note: An exclamation mark (!) indicates a result that was not dispersed into   the flowsheet.  Document Creation Date: 03/26/2010 12:29 AM  _______________________________________________________________________    (1) Order result status: Final  Collection or observation date-time: 03/25/2010 08:23  Requested date-time:   Receipt date-time: 03/25/2010 08:24  Reported date-time: 03/26/2010 00:00  Referring Physician:    Ordering Physician: Sarthak Rubenstein (HOVERMAK)  Specimen Source: B  Source: [name] Order Number: [id number]  Lab site: [facility] [place]      [address]      [place]  [place]  [address]

## 2010-03-25 NOTE — Unmapped (Signed)
Signed by Irena Cords MD on 03/26/2010 at 08:42:39  Patient: Wanda Rowe  Note: All result statuses are Final unless otherwise noted.    Tests: (1) LIPID PANEL (QDL-7600)    CHOLESTEROL, TOTAL   [H]  225 mg/dL                   403-474    HDL CHOLESTEROL           46 mg/dL                    > OR = 46    TRIGLYCERIDES        [H]  184 mg/dL                   <259   LDL-CHOLESTEROL (calc)                         [H]  142 mg/dL                   <563             Desirable range <100 mg/dL for patients with CHD or      diabetes and <70 mg/dL for diabetic patients with      known heart disease.          CHOL/HDLC RATIO (calc)                              4.9                         < OR = 5.0    Note: An exclamation mark (!) indicates a result that was not dispersed into   the flowsheet.  Document Creation Date: 03/26/2010 12:29 AM  _______________________________________________________________________    (1) Order result status: Final  Collection or observation date-time: 03/25/2010 08:23  Requested date-time:   Receipt date-time: 03/25/2010 08:24  Reported date-time: 03/26/2010 00:00  Referring Physician:    Ordering Physician: Rosabel Sermeno (HOVERMAK)  Specimen Source: S  Source: [name] Order Number: [id number]  Lab site: [facility] [place]      [address]      [place]  [place]  [address]

## 2010-03-25 NOTE — Unmapped (Signed)
Signed by Irena Cords MD on 03/26/2010 at 08:42:39  Patient: Wanda Rowe  Note: All result statuses are Final unless otherwise noted.    Tests: (1) TSH (QDL-899)    TSH                       1.09 mIU/L      Reference Range              > or = 20 Years  0.40-4.50                   Pregnancy Ranges       First trimester    0.20-4.70       Second trimester   0.30-4.10       Third trimester    0.40-2.70            REPORT COMMENT:      FASTING    Note: An exclamation mark (!) indicates a result that was not dispersed into   the flowsheet.  Document Creation Date: 03/26/2010 1:10 AM  _______________________________________________________________________    (1) Order result status: Final  Collection or observation date-time: 03/25/2010 08:23  Requested date-time:   Receipt date-time: 03/25/2010 08:24  Reported date-time: 03/26/2010 01:00  Referring Physician:    Ordering Physician: Belissa Kooy (HOVERMAK)  Specimen Source: S  Source: [name] Order Number: [id number]  Lab site: [facility] [place]      [address]      [place]  [place]  [address]

## 2010-04-01 NOTE — Unmapped (Signed)
Signed by Irena Cords MD on 04/02/2010 at 06:13:02      Reason for Visit   Chief Complaint: Lab Review    History from: patient    Allergies  ! SULFA  ! ZITHROMAX (AZITHROMYCIN TABS)  Allergy and adverse reaction list reviewed during this update.      Medications   OTC Meds Reviewed  Medication list reviewed during this update. SINGULAIR 10 MG TAB (MONTELUKAST SODIUM) one by mouth at bedtime  NEURONTIN 300 MG CAPS (GABAPENTIN) one by mouth tid  FLONASE 50 MCG/ACT SUSPN (FLUTICASONE PROPIONATE (NASAL)) two sprays each nostril at bedtime  ATROVENT 0.03 % SOLN (IPRATROPIUM BROMIDE) 2 sprays bid  VALTREX 500 MG TABS (VALACYCLOVIR HCL) one by mouth dailey  * CALCIUM CITRATE 1 qd  BENICAR HCT 20-12.5 MG TABS (OLMESARTAN MEDOXOMIL-HCTZ) one by mouth daily  SYNTHROID 88 MCG TABS (LEVOTHYROXINE SODIUM) one by mouth daily  VALTREX 500 MG TABS (VALACYCLOVIR HCL) one by mouth qd  * GLUCOMETER fsbs bid  * LANCETS TEST STRIPS fsbs bid  VITAMIN D 11914 UNIT CAPS (ERGOCALCIFEROL) one by mouth every week  B-12 250 MCG TABS (CYANOCOBALAMIN) 1 qd  LEXAPRO 10 MG TABS (ESCITALOPRAM OXALATE) one by mouth daily  METFORMIN HCL 1000 MG TABS (METFORMIN HCL) 1 bid  * LANCETS, STRIPS FOR GLUCOMETER fsbs bid  OMEPRAZOLE 20 MG CPDR (OMEPRAZOLE) one by mouth daily  VICTOZA  SOLN (LIRAGLUTIDE SOLN) 0.6 mg daily x 1 week then 1.2 mg/day and  needles       Vital Signs:   Ht: 69 in.  Wt: 270 lbs.      BMI: 40.02  BSA: 2.35  Wt chg (lbs): 0  Temperature: 98.6  degrees F  oral  Pulse: 68 (regular)  Resp: 12    Patient appears to be in acute distress: no  BP: 118/82  Cuff size: large    Intake recorded by: Vance Peper on April 01, 2010 8:11 AM        HPI Multiple Chronic   Additional Dx: f/u dm2, htn, chol, tg, thyroid. stopped lovasa due to diarrhea no decrease in appetite finger toes were peeling better now has alot of stress with work, single mom stress eating    ROS/Symptoms:   Patient denies any cardiovascular symptoms.  Patient denies  any respiratory symptoms.  Patient denies any GI symptoms.    PAST HISTORY  Past Medical History (reviewed - no changes required):  Hyperlipidemia, Hypertension, Diabetes Type II, Hypothyroidism, GERD, Stomach polyps, RSD right knee, Anxiety, Mild Asthma, .    Surgical History (reviewed - no changes required):  ACL Repair: x left knee, Meniscal Repair: left knee x 2,  right knee x 1, Peridontal surgery    Family History (reviewed - no changes required): Mother - HTN, Degenerative Disc Disease  Father - DMII, HTN, Factor VIII inhibitor  Brother - Back Pain  MGM - DMII, Bladder CA, CVA  MGF - Colon CA  PGM - Breast CA        Physical Examination:   BP: 118/  82    Physical Exam- Detail:   General Appearance: well-developed, well-nourished and in no acute distress.  Respiratory: Respiration un-labored.  Lung fields clear to auscultation.  No wheezing, rales, rhonchi or pleural rub.  Cardiac: S1 and S2 normal.  RRR without murmurs, rubs, gallops.  No JVD.  Vascular: No carotid bruits.  No edema or varicosities.           New Medications:  WELCHOL  TABS (  COLESEVELAM HCL TABS) 3 tabs bid  METFORMIN HCL 1000 MG TABS (METFORMIN HCL) one by mouth twice a day        Preventive Maintenance       Coordinating Care Providers   PCP Name: Dr. Gari Crown             Prescriptions:  OMEPRAZOLE 20 MG CPDR (OMEPRAZOLE) one by mouth daily  #90 x 3   Entered and Authorized by: Dorn Hartshorne Johnn Hai MD   Signed by: Irena Cords MD on 04/01/2010   Method used: Print then Give to Patient   RxID: [id number]  LEXAPRO 10 MG TABS (ESCITALOPRAM OXALATE) one by mouth daily  #30 x 5   Entered and Authorized by: Xenia Nile Johnn Hai MD   Signed by: Irena Cords MD on 04/01/2010   Method used: Print then Give to Patient   RxID: [id number]  VITAMIN D 84696 UNIT CAPS (ERGOCALCIFEROL) one by mouth every week  #4 x 5   Entered and Authorized by: Latrish Mogel Johnn Hai MD   Signed by: Irena Cords MD on 04/01/2010   Method used: Print then Give to  Patient   RxID: [id number]  SYNTHROID 88 MCG TABS (LEVOTHYROXINE SODIUM) one by mouth daily  #30 x 5   Entered and Authorized by: Lelar Farewell Johnn Hai MD   Signed by: Irena Cords MD on 04/01/2010   Method used: Print then Give to Patient   RxID: [id number]  BENICAR HCT 20-12.5 MG TABS (OLMESARTAN MEDOXOMIL-HCTZ) one by mouth daily  #30 x 5   Entered and Authorized by: Willo Yoon Johnn Hai MD   Signed by: Irena Cords MD on 04/01/2010   Method used: Print then Give to Patient   RxID: [id number]  ATROVENT 0.03 % SOLN (IPRATROPIUM BROMIDE) 2 sprays bid  #1 x 5   Entered and Authorized by: Saleha Kalp Johnn Hai MD   Signed by: Irena Cords MD on 04/01/2010   Method used: Print then Give to Patient   RxID: [id number]  FLONASE 50 MCG/ACT SUSPN (FLUTICASONE PROPIONATE (NASAL)) two sprays each nostril at bedtime  #1 x 5   Entered and Authorized by: Rodert Hinch Johnn Hai MD   Signed by: Irena Cords MD on 04/01/2010   Method used: Print then Give to Patient   RxID: [id number]  NEURONTIN 300 MG CAPS (GABAPENTIN) one by mouth tid  #90 x 5   Entered and Authorized by: Fatimah Sundquist Johnn Hai MD   Signed by: Irena Cords MD on 04/01/2010   Method used: Print then Give to Patient   RxID: [id number]  SINGULAIR 10 MG TAB (MONTELUKAST SODIUM) one by mouth at bedtime  #30 x 5   Entered and Authorized by: Mika Anastasi Johnn Hai MD   Signed by: Irena Cords MD on 04/01/2010   Method used: Print then Give to Patient   RxID: [id number]  METFORMIN HCL 1000 MG TABS (METFORMIN HCL) one by mouth twice a day  #60 x 3   Entered and Authorized by: Gurney Balthazor Johnn Hai MD   Signed by: Irena Cords MD on 04/01/2010   Method used: Print then Give to Patient   RxID: [id number]  WELCHOL  TABS (COLESEVELAM HCL TABS) 3 tabs bid  #180 x 3   Entered and Authorized by: Kayton Ripp Johnn Hai MD   Signed by: Irena Cords MD on 04/01/2010   Method used: Print then Give to Patient   RxID: [id number]      Assessment and Plan  Problems   Status of Existing Problems:  Assessed TRANSAMINASES, SERUM, ELEVATED as unchanged - monitor fatty liver - Jens Siems K Perrin Gens MD  Assessed VITAMIN D DEFICIENCY as improved - continue current medications - Farran Amsden K Toyia Jelinek MD  Assessed HYPOTHYROIDISM as improved - continue current medications - Thorsten Climer K Gearline Spilman MD  Assessed DIABETES MELLITUS, TYPE II as improved - trial welchol, metformin - Chancey Ringel K Bayani Renteria MD  Assessed HYPERTRIGLYCERIDEMIA as deteriorated - welchol - Tranice Laduke K Tamara Kenyon MD  Assessed ANXIETY as improved - continue current medications - Anvith Mauriello K Alisha Bacus MD  Assessed HYPERTENSION as improved - continue current medications - Folasade Mooty K Haruto Demaria MD    Medications   New Prescriptions/Refills:  OMEPRAZOLE 20 MG CPDR (OMEPRAZOLE) one by mouth daily  #90 x 3, 04/01/2010, Katianna Mcclenney K Laurance Heide MD  LEXAPRO 10 MG TABS (ESCITALOPRAM OXALATE) one by mouth daily  #30 x 5, 04/01/2010, Haille Pardi K Eriko Economos MD  VITAMIN D 42595 UNIT CAPS (ERGOCALCIFEROL) one by mouth every week  #4 x 5, 04/01/2010, Alison Kubicki K Kendrix Orman MD  SYNTHROID 88 MCG TABS (LEVOTHYROXINE SODIUM) one by mouth daily  #30 x 5, 04/01/2010, Venise Ellingwood K Kallee Nam MD  BENICAR HCT 20-12.5 MG TABS (OLMESARTAN MEDOXOMIL-HCTZ) one by mouth daily  #30 x 5, 04/01/2010, Flavio Lindroth K Ciani Rutten MD  ATROVENT 0.03 % SOLN (IPRATROPIUM BROMIDE) 2 sprays bid  #1 x 5, 04/01/2010, Sotiria Keast K Drago Hammonds MD  FLONASE 50 MCG/ACT SUSPN (FLUTICASONE PROPIONATE (NASAL)) two sprays each nostril at bedtime  #1 x 5, 04/01/2010, Iyannah Blake K Emryn Flanery MD  NEURONTIN 300 MG CAPS (GABAPENTIN) one by mouth tid  #90 x 5, 04/01/2010, Eion Timbrook K Tomie Spizzirri MD  SINGULAIR 10 MG TAB (MONTELUKAST SODIUM) one by mouth at bedtime  #30 x 5, 04/01/2010, Khaled Herda K [place] MD  METFORMIN HCL 1000 MG TABS (METFORMIN HCL) one by mouth twice a day  #60 x 3, 04/01/2010, Maurice Fotheringham K Kaaren Nass MD  WELCHOL  TABS (COLESEVELAM HCL TABS) 3 tabs bid  #180 x 3, 04/01/2010, Goldy Calandra K Oaklyn Mans MD    Today's Orders   CMP   (METAPNL) (10231) [CPT-80053]  Lipid Profile    (FATS) (7600) [CPT-80061]  Hemoglobin  A1C   (GLYCO) (496)  [CPT-83036]  TSH   (TSH) (899) [GLO-75643]  Prolactin Level  (PROLACT) (746)  [CPT-84146]  C-Reactive Protein, High Sensitivity    (CRPHS) (10124) [CPT-86141]  99214 - Ofc Vst, Est Level IV [place]ant                ]

## 2010-04-11 NOTE — Unmapped (Signed)
Signed by Wandra Feinstein MD on 04/11/2010 at 15:17:41      Reason for Visit   Chief Complaint: cough, congestion, headache.  the cough is productive.  pt feels she is wheezing a little bit as well    History from: patient    Allergies  ! SULFA  ! ZITHROMAX (AZITHROMYCIN TABS)  Allergy and adverse reaction list reviewed during this update.      Medications   Medication list reviewed during this update. Current Meds:   SINGULAIR 10 MG TAB (MONTELUKAST SODIUM) one by mouth at bedtime  NEURONTIN 300 MG CAPS (GABAPENTIN) one by mouth tid  FLONASE 50 MCG/ACT SUSPN (FLUTICASONE PROPIONATE (NASAL)) two sprays each nostril at bedtime  ATROVENT 0.03 % SOLN (IPRATROPIUM BROMIDE) 2 sprays bid  VALTREX 500 MG TABS (VALACYCLOVIR HCL) one by mouth dailey  * CALCIUM CITRATE 1 qd  BENICAR HCT 20-12.5 MG TABS (OLMESARTAN MEDOXOMIL-HCTZ) one by mouth daily  SYNTHROID 88 MCG TABS (LEVOTHYROXINE SODIUM) one by mouth daily  VALTREX 500 MG TABS (VALACYCLOVIR HCL) one by mouth qd  * GLUCOMETER fsbs bid  * LANCETS TEST STRIPS fsbs bid  VITAMIN D 08657 UNIT CAPS (ERGOCALCIFEROL) one by mouth every week  B-12 250 MCG TABS (CYANOCOBALAMIN) 1 qd  LEXAPRO 10 MG TABS (ESCITALOPRAM OXALATE) one by mouth daily  * LANCETS, STRIPS FOR GLUCOMETER fsbs bid  OMEPRAZOLE 20 MG CPDR (OMEPRAZOLE) one by mouth daily  WELCHOL  TABS (COLESEVELAM HCL TABS) 3 tabs bid  METFORMIN HCL 1000 MG TABS (METFORMIN HCL) one by mouth twice a day        Vital Signs:   Wt: 268 lbs.      BMI: 39.72  BSA: 2.34  Wt chg (lbs): -2  Temperature: 98.6  degrees F  oral  Pulse: 74 (regular)  Resp: 12    Patient appears to be in acute distress: no  BP: 124/82  Cuff size: large    Intake recorded by: Alroy Dust on April 11, 2010 2:26 PM      History of Present Illness: More than 11 days of symptoms  Cough, chest congestion  No sig SOB, but + wheezing  Sinuc congestion, sinus HAs, no sig ear pain  no fever/chills    Asthma - has not been using albuterol inh  Hx of chronic sinusitis          Review of Systems  Refer to HPI for review of systems documentation.      Physical Examination:   BP: 124/  82    Physical Exam- Detail:   General Appearance: well appearing, NAD  Ears: bilat retracted  Nose/Face: erythematous boggy mucosa, purulent drainage  Oropharynx: Erythema, no exudate   Respiratory: Respiration un-labored. Very mild occasional exp wheezes, otherwise CTA  Neck: no LAD           New Problems:  SINUSITIS, ACUTE (ICD-461.9)  RHINOSINUSITIS, CHRONIC (ICD-473.8)  New Medications:  AUGMENTIN 875-125 MG TABS (AMOXICILLIN-POT CLAVULANATE) one by mouth twice daily  ALBUTEROL SULFATE HFA 108 MCG/ACT AERS (ALBUTEROL SULFATE) one to two puffs every 4-6 hours as needed  TESSALON 200 MG CAPS (BENZONATATE) one by mouth three times a day as needed for cough        Preventive Maintenance       Coordinating Care Providers   PCP Name: Dr. Gari Crown             Prescriptions:  TESSALON 200 MG CAPS (BENZONATATE) one by mouth three times a day  as needed for cough  #30 x 0   Entered and Authorized by: Wandra Feinstein MD   Signed by: Wandra Feinstein MD on 04/11/2010   Method used: Print then Give to Patient   RxID: [id number]  ALBUTEROL SULFATE HFA 108 MCG/ACT AERS (ALBUTEROL SULFATE) one to two puffs every 4-6 hours as needed  #1 x 3   Entered and Authorized by: Wandra Feinstein MD   Signed by: Wandra Feinstein MD on 04/11/2010   Method used: Print then Give to Patient   RxID: [id number]  AUGMENTIN 875-125 MG TABS (AMOXICILLIN-POT CLAVULANATE) one by mouth twice daily  #20 x 0   Entered and Authorized by: Wandra Feinstein MD   Signed by: Wandra Feinstein MD on 04/11/2010   Method used: Print then Give to Patient   RxID: [id number]      Assessment and Plan     Problems   Status of Existing Problems:  Assessed SINUSITIS, ACUTE as new - acute on chronic sinusitis, concern for bacterial infection, course of Augmentin.   Wandra Feinstein MD  Assessed RHINOSINUSITIS, CHRONIC as new - Cont current tx.  If persistent congestion following  antibiotics, need to f/u c ENT - Wandra Feinstein MD  Assessed ASTHMA as comment only - No acute exacerbation,  Resp status stable.  albuterol inh as needed for mild bronchospasm - Wandra Feinstein MD  New Problems:  Dx of SINUSITIS, ACUTE (ICD-461.9)  Onset: 04/11/2010  Dx of RHINOSINUSITIS, CHRONIC (ICD-473.8)  Onset: 04/11/2010    Medications   New Prescriptions/Refills:  TESSALON 200 MG CAPS (BENZONATATE) one by mouth three times a day as needed for cough  #30 x 0, 04/11/2010, Wandra Feinstein MD  ALBUTEROL SULFATE HFA 108 MCG/ACT AERS (ALBUTEROL SULFATE) one to two puffs every 4-6 hours as needed  #1 x 3, 04/11/2010, Wandra Feinstein MD  AUGMENTIN 875-125 MG TABS (AMOXICILLIN-POT CLAVULANATE) one by mouth twice daily  #20 x 0, 04/11/2010, Wandra Feinstein MD    Today's Orders   [phone] - Ofc Vst, Est Level III [place]                ]

## 2010-04-21 NOTE — Unmapped (Signed)
Signed by Alroy Dust on 04/21/2010 at 17:03:26    PHONE NOTE  Call back at Home Phone: [phone]  Caller: patient  Department: IM - General  Call for: Wanda Rowe    Reason for Call: refill medication, speak with nurse, speak with provider. pt was seen on 04/11/2010 and states she is still no t back to 100%. pt would like Four more days of the med called in AUGMENTIN 875-125        Pharmacy Information: [phone]      Initial call taken by: Myrtie Neither Mefford,  April 21, 2010 11:11 AM    Current Allergies:   ! SULFA  ! ZITHROMAX (AZITHROMYCIN TABS)    New Medications:  Prescriptions:  AUGMENTIN 875-125 MG TABS (AMOXICILLIN-POT CLAVULANATE) one by mouth twice daily  #8 x 0   Entered and Authorized by: Wandra Feinstein MD   Signed by: Alroy Dust on 04/21/2010   Method used: Telephoned to ...     [facility] (retail)     [address]     [place], [place]  [address]     Ph: [phone]     Fax: [phone]   RxID: [id number]  AUGMENTIN 875-125 MG TABS (AMOXICILLIN-POT CLAVULANATE) one by mouth twice daily    FOLLOW UP  Please call pt back on if you call in a antibotic or not- thanks pt number is [phone]   Follow-up by:  Avanell Shackleton,  April 21, 2010 2:53 PM    FOLLOW UP  will try 4 more days of Augmentin  Follow-up by:  Wandra Feinstein MD,  April 21, 2010 4:59 PM    FOLLOW UP  patient advised, phone call completed, Rx completed  Follow-up by:  Alroy Dust,  April 21, 2010 5:03 PM    Prescriptions:  AUGMENTIN 875-125 MG TABS (AMOXICILLIN-POT CLAVULANATE) one by mouth twice daily  #8 x 0   Entered and Authorized by: Wandra Feinstein MD   Signed by: Alroy Dust on 04/21/2010   Method used: Telephoned to ...     [facility] (retail)     [address]     [place], [place]  [address]     Ph: [phone]     Fax: [phone]   RxID: [id number]

## 2010-06-25 LAB — LIPID PANEL
Chol/HDL Ratio: 4.3 (ref <=5.0)
Cholesterol, Total: 187 mg/dL (ref 125–200)
HDL: 44 mg/dL — ABNORMAL LOW (ref >=46)
LDL Cholesterol: 84 mg/dL (ref <130)
Triglycerides: 296 mg/dL — ABNORMAL HIGH (ref <150)

## 2010-06-25 LAB — COMPREHENSIVE METABOLIC PANEL
A/G Ratio: 2 (ref 1.0–2.1)
ALT: 53 U/L — ABNORMAL HIGH (ref 6–40)
AST: 36 U/L — ABNORMAL HIGH (ref 10–35)
Albumin: 4.7 g/dL (ref 3.6–5.1)
Alkaline Phosphatase: 81 U/L (ref 33–115)
BUN: 16 mg/dL (ref 7–25)
CO2: 26 mmol/L (ref 21–33)
Calcium: 10 mg/dL (ref 8.6–10.2)
Chloride: 104 mmol/L (ref 98–110)
Creatinine: 0.65 mg/dL (ref 0.50–1.10)
GFR MDRD Af Amer: 121 mL/min (ref >=60)
GFR MDRD Non Af Amer: 104 mL/min (ref >=60)
Globulin, Total: 2.4 g/dL (ref 2.2–3.9)
Glucose: 140 mg/dL — ABNORMAL HIGH (ref 65–99)
Potassium: 4 mmol/L (ref 3.5–5.3)
Sodium: 140 mmol/L (ref 135–146)
Total Bilirubin: 0.2 mg/dL (ref 0.2–1.2)
Total Protein: 7.1 g/dL (ref 6.2–8.3)

## 2010-06-25 LAB — PROLACTIN: Prolactin: 6.5 ng/mL

## 2010-06-25 LAB — HEMOGLOBIN A1C: Hemoglobin A1C: 6.3 % (ref <5.7)

## 2010-06-25 LAB — TSH: TSH: 1.45 u[IU]/mL

## 2010-06-25 NOTE — Unmapped (Signed)
Signed by Irena Cords MD on 06/26/2010 at 05:31:28  Patient: Wanda Rowe  Note: All result statuses are Final unless otherwise noted.    Tests: (1) LIPID PANEL (QDL-7600)    CHOLESTEROL, TOTAL        187 mg/dL                   161-096    HDL CHOLESTEROL      [L]  44 mg/dL                    > OR = 46    TRIGLYCERIDES        [H]  296 mg/dL                   <045   LDL-CHOLESTEROL (calc)                              84 mg/dL                    <409             Desirable range <100 mg/dL for patients with CHD or      diabetes and <70 mg/dL for diabetic patients with      known heart disease.          CHOL/HDLC RATIO (calc)                              4.3                         < OR = 5.0  ! NON-HDL CHOLESTEROL (calc)                              143 mg/dL             Target for non-HDL cholesterol is 30 mg/dL higher than      LDL cholesterol target.           Note: An exclamation mark (!) indicates a result that was not dispersed into   the flowsheet.  Document Creation Date: 06/26/2010 12:01 AM  _______________________________________________________________________    (1) Order result status: Final  Collection or observation date-time: 06/25/2010 08:17  Requested date-time:   Receipt date-time: 06/25/2010 08:18  Reported date-time: 06/25/2010 23:00  Referring Physician:    Ordering Physician: Eulia Hatcher (HOVERMAK)  Specimen Source: S  Source: [name] Order Number: [id number]  Lab site: [facility] [place]      [address]      [place]  [place]  [address]

## 2010-06-25 NOTE — Unmapped (Signed)
Signed by Irena Cords MD on 06/26/2010 at 05:31:58  Patient: Wanda Rowe  Note: All result statuses are Final unless otherwise noted.    Tests: (1) COMPREHENSIVE METABOLIC PANEL (QDL-10231)    GLUCOSE              [H]  140 mg/dL                   47-82                        Fasting reference interval           UREA NITROGEN (BUN)       16 mg/dL                    9-56    CREATININE                0.65 mg/dL                  0.50-1.10   eGFR NON-AFR. AMERICAN                              104 mL/min/1.26m2           > OR = 60   eGFR AFRICAN AMERICAN                              121 mL/min/1.17m2           > OR = 60   BUN/CREATININE RATIO (calc)                              NOT APPLICABLE              6-22    SODIUM                    140 mmol/L                  135-146    POTASSIUM                 4.0 mmol/L                  3.5-5.3    CHLORIDE                  104 mmol/L                  98-110    CARBON DIOXIDE            26 mmol/L                   21-33    CALCIUM                   10.0 mg/dL                  2.1-30.8    PROTEIN, TOTAL            7.1 g/dL                    6.5-7.8    ALBUMIN                   4.7 g/dL  3.6-5.1    GLOBULIN (calc)           2.4 g/dL                    1.6-1.0   ALBUMIN/GLOBULIN RATIO (calc)                              2.0                         1.0-2.1    BILIRUBIN, TOTAL          0.2 mg/dL                   9.6-0.4    ALKALINE PHOSPHATASE      81 U/L                      33-115    AST                  [H]  36 U/L                      10-35    ALT                  [H]  53 U/L                      6-40    Note: An exclamation mark (!) indicates a result that was not dispersed into   the flowsheet.  Document Creation Date: 06/26/2010 12:01 AM  _______________________________________________________________________    (1) Order result status: Final  Collection or observation date-time: 06/25/2010 08:17  Requested date-time:   Receipt date-time: 06/25/2010  08:18  Reported date-time: 06/25/2010 23:00  Referring Physician:    Ordering Physician: Avenell Sellers (HOVERMAK)  Specimen Source: S  Source: [name] Order Number: [id number]  Lab site: [facility] [place]      [address]        [place]  [address]      -----------------    The following non-numeric lab results were dispersed to  the flowsheet even though numeric results were expected:      BUN/CREATININE RATIO (calc), NOT APPLICABLE

## 2010-06-25 NOTE — Unmapped (Signed)
Signed by Irena Cords MD on 06/26/2010 at 05:31:58  Patient: Wanda Rowe  Note: All result statuses are Final unless otherwise noted.    Tests: (1) TSH (QDL-899)    TSH                       1.45 mIU/L                Reference Range                                 > or = 20 Years  0.40-4.50                                      Pregnancy Ranges                First trimester    0.26-2.66                Second trimester   0.55-2.73                Third trimester    0.43-2.91    Note: An exclamation mark (!) indicates a result that was not dispersed into   the flowsheet.  Document Creation Date: 06/26/2010 1:34 AM  _______________________________________________________________________    (1) Order result status: Final  Collection or observation date-time: 06/25/2010 08:17  Requested date-time:   Receipt date-time: 06/25/2010 08:18  Reported date-time: 06/26/2010 01:00  Referring Physician:    Ordering Physician: Kura Bethards (HOVERMAK)  Specimen Source: S  Source: [name] Order Number: [id number]  Lab site: [facility] [place]      [address]      [place]  [place]  [address]

## 2010-06-25 NOTE — Unmapped (Signed)
Signed by Irena Cords MD on 06/26/2010 at 10:27:52  Patient: Wanda Rowe  Note: All result statuses are Final unless otherwise noted.    Tests: (1) PROLACTIN (QDL-746)    PROLACTIN                 6.5 ng/mL                  Reference Range       Females              Non-pregnant        3.0-30.0              Pregnant           10.0-209.0              Postmenopausal      2.0-20.0                                                     Note: An exclamation mark (!) indicates a result that was not dispersed into   the flowsheet.  Document Creation Date: 06/26/2010 9:07 AM  _______________________________________________________________________    (1) Order result status: Final  Collection or observation date-time: 06/25/2010 08:17  Requested date-time:   Receipt date-time: 06/25/2010 08:18  Reported date-time: 06/26/2010 08:00  Referring Physician:    Ordering Physician: Iris Tatsch (HOVERMAK)  Specimen Source: S  Source: [name] Order Number: [id number]  Lab site: [facility] [place]      [address]      [place]  [place]  [address]

## 2010-06-25 NOTE — Unmapped (Signed)
Signed by Irena Cords MD on 06/26/2010 at 05:31:58  Patient: Wanda Rowe  Note: All result statuses are Final unless otherwise noted.    Tests: (1) HEMOGLOBIN A1c (QDL-496)   HEMOGLOBIN A1c (% of total Hgb)                         [H]  6.3 %                       <5.7                      Higher risk of diabetes                       <5.7       Decreased risk of diabetes                       5.7-6.0    Increased risk of diabetes                       6.1-6.4    Higher risk of diabetes                       > or = 6.5 Consistent with diabetes                         Standards of Medical Care in Diabetes-2010.                  Diabetes Care, 33(Supp 1): J4-N82,9562.    Note: An exclamation mark (!) indicates a result that was not dispersed into   the flowsheet.  Document Creation Date: 06/26/2010 1:06 AM  _______________________________________________________________________    (1) Order result status: Final  Collection or observation date-time: 06/25/2010 08:17  Requested date-time:   Receipt date-time: 06/25/2010 08:18  Reported date-time: 06/26/2010 00:00  Referring Physician:    Ordering Physician: Ramere Downs (HOVERMAK)  Specimen Source: B  Source: [name] Order Number: [id number]  Lab site: [facility] [place]      [address]      [place]  [place]  [address]

## 2010-06-25 NOTE — Unmapped (Signed)
Signed by Irena Cords MD on 06/26/2010 at 13:45:05  Patient: Wanda Rowe  Note: All result statuses are Final unless otherwise noted.    Tests: (1) CARDIO CRP(R) ([id number])  ! CARDIO CRP(R)             1.9 mg/L             Average relative cardiovascular risk according to      AHA/CDC guidelines.                    For ages >51 Years:      cCRP mg/L    Risk According to AHA/CDC Guidelines      <1.0         Lower relative cardiovascular risk.      1.0-3.0      Average relative cardiovascular risk.      3.1-10.0     Higher relative cardiovascular risk.                   Consider retesting in 1 to 2 weeks to                   exclude a benign transient elevation                   in the baseline CRP value secondary                   to infection or inflammation.      >10.0        Persistent elevation, upon retesting,                   may be associated with infection and                   inflammation.           Note: An exclamation mark (!) indicates a result that was not dispersed into   the flowsheet.  Document Creation Date: 06/26/2010 12:55 PM  _______________________________________________________________________    (1) Order result status: Final  Collection or observation date-time: 06/25/2010 08:17  Requested date-time:   Receipt date-time: 06/25/2010 08:18  Reported date-time: 06/26/2010 12:00  Referring Physician:    Ordering Physician: Husam Hohn (HOVERMAK)  Specimen Source: S  Source: [name] Order Number: [id number]  Lab site: [facility]       [address]      [place]  [place]  [id number]

## 2010-07-01 NOTE — Unmapped (Signed)
Signed by Irena Cords MD on 07/01/2010 at 08:49:16      Reason for Visit   Chief Complaint: Lab Review    History from: patient    Allergies  ! SULFA  ! ZITHROMAX (AZITHROMYCIN TABS)  Allergy and adverse reaction list reviewed during this update.      Medications   OTC Meds Reviewed  Medication list reviewed during this update. SINGULAIR 10 MG TAB (MONTELUKAST SODIUM) one by mouth at bedtime  NEURONTIN 300 MG CAPS (GABAPENTIN) one by mouth tid  FLONASE 50 MCG/ACT SUSPN (FLUTICASONE PROPIONATE (NASAL)) two sprays each nostril at bedtime  ATROVENT 0.03 % SOLN (IPRATROPIUM BROMIDE) 2 sprays bid  VALTREX 500 MG TABS (VALACYCLOVIR HCL) one by mouth dailey  * CALCIUM CITRATE 1 qd  BENICAR HCT 20-12.5 MG TABS (OLMESARTAN MEDOXOMIL-HCTZ) one by mouth daily  SYNTHROID 88 MCG TABS (LEVOTHYROXINE SODIUM) one by mouth daily  VALTREX 500 MG TABS (VALACYCLOVIR HCL) one by mouth qd  * GLUCOMETER fsbs bid  * LANCETS TEST STRIPS fsbs bid  VITAMIN D 54098 UNIT CAPS (ERGOCALCIFEROL) one by mouth every week  B-12 250 MCG TABS (CYANOCOBALAMIN) 1 qd  LEXAPRO 10 MG TABS (ESCITALOPRAM OXALATE) one by mouth daily  * LANCETS, STRIPS FOR GLUCOMETER fsbs bid  OMEPRAZOLE 20 MG CPDR (OMEPRAZOLE) one by mouth daily  WELCHOL  TABS (COLESEVELAM HCL TABS) 3 tabs bid  METFORMIN HCL 1000 MG TABS (METFORMIN HCL) one by mouth twice a day  ALBUTEROL SULFATE HFA 108 MCG/ACT AERS (ALBUTEROL SULFATE) one to two puffs every 4-6 hours as needed  TESSALON 200 MG CAPS (BENZONATATE) one by mouth three times a day as needed for cough       Vital Signs:   Ht: 69 in.  Wt: 260 lbs.      BMI: 38.53  BSA: 2.31  Wt chg (lbs): -8  Temperature: 98.6  degrees F  oral  Pulse: 68 (regular)  Resp: 12    Patient appears to be in acute distress: no  BP: 122/82  Cuff size: large    Intake recorded by: Vance Peper on Jul 01, 2010 8:25 AM        HPI Multiple Chronic   Additional Dx: f/u asthma lipids, hyperglycemia, htn, thyroid exercising 5 days a week. also c/o st r ear  pain    ROS/Symptoms:   Patient denies any cardiovascular symptoms.  Patient denies any respiratory symptoms.  Patient denies any GI symptoms.  Patient denies any musculoskeletal symptoms.    PAST HISTORY  Past Medical History (reviewed - no changes required):  Hyperlipidemia, Hypertension, Diabetes Type II, Hypothyroidism, GERD, Stomach polyps, RSD right knee, Anxiety, Mild Asthma, .    Surgical History (reviewed - no changes required):  ACL Repair: x left knee, Meniscal Repair: left knee x 2,  right knee x 1, Peridontal surgery    Family History (reviewed - no changes required): Mother - HTN, Degenerative Disc Disease  Father - DMII, HTN, Factor VIII inhibitor  Brother - Back Pain  MGM - DMII, Bladder CA, CVA  MGF - Colon CA  PGM - Breast CA        Physical Examination:   BP: 122/  82    Physical Exam- Detail:   General Appearance: well-developed, well-nourished and in no acute distress.  Ears: No lesions.  Tympanic membranes translucent, non-bulging.  Canal walls pink, without discharge.  Hearing grossly intact.  Oropharynx: r tonsil enlarged  Respiratory: Respiration un-labored.  Lung fields clear to auscultation.  No  wheezing, rales, rhonchi or pleural rub.  Cardiac: S1 and S2 normal.  RRR without murmurs, rubs, gallops.  No JVD.  Vascular: No carotid bruits.  No edema or varicosities.  Abdomen: No masses or tenderness. Bowel sounds active x4 quad.  Liver and spleen are without tenderness or enlargement.  No hernias.  Psychiatric: Judgement and insight are within normal limits.  Alert and oriented x3.  No mood disorders noted, appropriate affect.           New Problems:  PHARYNGITIS (ICD-462)  New Medications:  LOPID 600 MG TABS (GEMFIBROZIL) one by mouth twice a day  AUGMENTIN 875-125 MG TABS (AMOXICILLIN-POT CLAVULANATE) one by mouth twice daily        Preventive Maintenance       Coordinating Care Providers   PCP Name: Dr. Gari Crown             Prescriptions:  AUGMENTIN 875-125 MG TABS (AMOXICILLIN-POT  CLAVULANATE) one by mouth twice daily  #20 x 0   Entered and Authorized by: Vashon Arch Johnn Hai MD   Signed by: Irena Cords MD on 07/01/2010   Method used: Print then Give to Patient   RxID: [id number]  LOPID 600 MG TABS (GEMFIBROZIL) one by mouth twice a day  #60 x 5   Entered and Authorized by: Maurissa Ambrose Johnn Hai MD   Signed by: Irena Cords MD on 07/01/2010   Method used: Print then Give to Patient   RxID: [id number]  OMEPRAZOLE 20 MG CPDR (OMEPRAZOLE) one by mouth daily  #90 x 3   Entered and Authorized by: Kayan Blissett Johnn Hai MD   Signed by: Irena Cords MD on 07/01/2010   Method used: Print then Give to Patient   RxID: [id number]  LEXAPRO 10 MG TABS (ESCITALOPRAM OXALATE) one by mouth daily  #30 x 5   Entered and Authorized by: Chelsye Suhre Johnn Hai MD   Signed by: Irena Cords MD on 07/01/2010   Method used: Print then Give to Patient   RxID: [id number]  VITAMIN D 42595 UNIT CAPS (ERGOCALCIFEROL) one by mouth every week  #4 x 5   Entered and Authorized by: Shaquan Puerta Johnn Hai MD   Signed by: Irena Cords MD on 07/01/2010   Method used: Print then Give to Patient   RxID: [id number]  VALTREX 500 MG TABS (VALACYCLOVIR HCL) one by mouth qd  #30 x 5   Entered and Authorized by: Cheril Slattery Johnn Hai MD   Signed by: Irena Cords MD on 07/01/2010   Method used: Print then Give to Patient   RxID: [id number]  SYNTHROID 88 MCG TABS (LEVOTHYROXINE SODIUM) one by mouth daily  #30 x 5   Entered and Authorized by: Deone Leifheit Johnn Hai MD   Signed by: Irena Cords MD on 07/01/2010   Method used: Print then Give to Patient   RxID: [id number]  BENICAR HCT 20-12.5 MG TABS (OLMESARTAN MEDOXOMIL-HCTZ) one by mouth daily  #30 x 5   Entered and Authorized by: Tyrez Berrios Johnn Hai MD   Signed by: Irena Cords MD on 07/01/2010   Method used: Print then Give to Patient   RxID: [id number]  ATROVENT 0.03 % SOLN (IPRATROPIUM BROMIDE) 2 sprays bid  #1 x 5   Entered and Authorized by: Daliana Leverett Johnn Hai  MD   Signed by: Irena Cords MD on 07/01/2010   Method used: Print then Give to Patient   RxID: [id number]  FLONASE 50 MCG/ACT SUSPN (FLUTICASONE PROPIONATE (NASAL)) two  sprays each nostril at bedtime  #1 x 5   Entered and Authorized by: Diona Peregoy Johnn Hai MD   Signed by: Irena Cords MD on 07/01/2010   Method used: Print then Give to Patient   RxID: [id number]  NEURONTIN 300 MG CAPS (GABAPENTIN) one by mouth tid  #90 x 5   Entered and Authorized by: Aycen Porreca Johnn Hai MD   Signed by: Irena Cords MD on 07/01/2010   Method used: Print then Give to Patient   RxID: [id number]  SINGULAIR 10 MG TAB (MONTELUKAST SODIUM) one by mouth at bedtime  #30 x 5   Entered and Authorized by: Alliene Klugh Johnn Hai MD   Signed by: Irena Cords MD on 07/01/2010   Method used: Print then Give to Patient   RxID: [id number]      Assessment and Plan     Problems   Status of Existing Problems:  Assessed ASTHMA as improved - continue current medications - Harlen Danford K Pinkie Manger MD  Assessed TRANSAMINASES, SERUM, ELEVATED as improved - cont wt loss - Aidel Davisson K Korah Hufstedler MD  Assessed DIABETES MELLITUS, TYPE II as deteriorated - monitor - Seyed Heffley K Kaicen Desena MD  Assessed HYPERTRIGLYCERIDEMIA as unchanged - lopid - Saskia Simerson K Tian Mcmurtrey MD  Assessed HYPERCHOLESTEROLEMIA as improved - cont wt loss - Kristy Schomburg K Mishelle Hassan MD  Assessed HYPERTENSION as improved - continue current medications - Quy Lotts K Latravious Levitt MD  Assessed PHARYNGITIS as new - augmentin - Lyla Jasek K Malayja Freund MD  New Problems:  Dx of PHARYNGITIS (ICD-462)  Onset: 07/01/2010    Medications   New Prescriptions/Refills:  AUGMENTIN 875-125 MG TABS (AMOXICILLIN-POT CLAVULANATE) one by mouth twice daily  #20 x 0, 07/01/2010, Aidric Endicott K Teancum Brule MD  LOPID 600 MG TABS (GEMFIBROZIL) one by mouth twice a day  #60 x 5, 07/01/2010, Darnel Mchan K Kailynn Satterly MD  OMEPRAZOLE 20 MG CPDR (OMEPRAZOLE) one by mouth daily  #90 x 3, 07/01/2010, Lavalle Skoda K Terin Dierolf MD  LEXAPRO 10 MG TABS (ESCITALOPRAM OXALATE) one by mouth daily   #30 x 5, 07/01/2010, Damion Kant K Garen Woolbright MD  VITAMIN D 01027 UNIT CAPS (ERGOCALCIFEROL) one by mouth every week  #4 x 5, 07/01/2010, Oluwadarasimi Favor K Erica Richwine MD  VALTREX 500 MG TABS (VALACYCLOVIR HCL) one by mouth qd  #30 x 5, 07/01/2010, Ottis Sarnowski K Chandrea Zellman MD  SYNTHROID 88 MCG TABS (LEVOTHYROXINE SODIUM) one by mouth daily  #30 x 5, 07/01/2010, Adrinne Sze K Sirena Riddle MD  BENICAR HCT 20-12.5 MG TABS (OLMESARTAN MEDOXOMIL-HCTZ) one by mouth daily  #30 x 5, 07/01/2010, Shealee Yordy K Tivon Lemoine MD  ATROVENT 0.03 % SOLN (IPRATROPIUM BROMIDE) 2 sprays bid  #1 x 5, 07/01/2010, Prabhnoor Ellenberger K Carr Shartzer MD  FLONASE 50 MCG/ACT SUSPN (FLUTICASONE PROPIONATE (NASAL)) two sprays each nostril at bedtime  #1 x 5, 07/01/2010, Dazia Lippold K Bates Collington MD  NEURONTIN 300 MG CAPS (GABAPENTIN) one by mouth tid  #90 x 5, 07/01/2010, Georgian Mcclory K Jonavon Trieu MD  SINGULAIR 10 MG TAB (MONTELUKAST SODIUM) one by mouth at bedtime  #30 x 5, 07/01/2010, Vonya Ohalloran K Zivah Mayr MD    Today's Orders   Colonoscopy, screening [CPT-0066T]  CMP   (METAPNL) (10231) [CPT-80053]  Lipid Profile   (FATS) (7600) [CPT-80061]  Hemoglobin  A1C   (GLYCO) (496)  [CPT-83036]  TSH   (TSH) (899) [OZD-66440]  99214 - Ofc Vst, Est Level IV Maela.Pennant                ]

## 2010-07-08 NOTE — Unmapped (Signed)
Signed by Fuller Song on 07/08/2010 at 15:38:04    [facility]  Division of Digestive Diseases        SURGERY / PROCEDURE SCHEDULE SHEET     Requested Date: 07/14/2010    Arrival Time: 10:30am    Requested Time: 12:30pm    Length of Surgery: 60 minutes    Comment: 3 year surveillance  Hovermale      Physician: Lurene Shadow MD    Facility: [facility]    Type of patient: Outside Referral    Anesthesia Type: MAC    Medications:   SINGULAIR 10 MG TAB (MONTELUKAST SODIUM) one by mouth at bedtime  NEURONTIN 300 MG CAPS (GABAPENTIN) one by mouth tid  FLONASE 50 MCG/ACT SUSPN (FLUTICASONE PROPIONATE (NASAL)) two sprays each nostril at bedtime  ATROVENT 0.03 % SOLN (IPRATROPIUM BROMIDE) 2 sprays bid  * CALCIUM CITRATE 1 qd  BENICAR HCT 20-12.5 MG TABS (OLMESARTAN MEDOXOMIL-HCTZ) one by mouth daily  SYNTHROID 88 MCG TABS (LEVOTHYROXINE SODIUM) one by mouth daily  VALTREX 500 MG TABS (VALACYCLOVIR HCL) one by mouth qd  * GLUCOMETER fsbs bid  * LANCETS TEST STRIPS fsbs bid  VITAMIN D 16109 UNIT CAPS (ERGOCALCIFEROL) one by mouth every week  B-12 250 MCG TABS (CYANOCOBALAMIN) 1 qd  LEXAPRO 10 MG TABS (ESCITALOPRAM OXALATE) one by mouth daily  * LANCETS, STRIPS FOR GLUCOMETER fsbs bid  OMEPRAZOLE 20 MG CPDR (OMEPRAZOLE) one by mouth daily  LOPID 600 MG TABS (GEMFIBROZIL) one by mouth twice a day  METFORMIN HCL 1000 MG TABS (METFORMIN HCL) one by mouth twice a day  ALBUTEROL SULFATE HFA 108 MCG/ACT AERS (ALBUTEROL SULFATE) one to two puffs every 4-6 hours as needed  AUGMENTIN 875-125 MG TABS (AMOXICILLIN-POT CLAVULANATE) one by mouth twice daily    Allergies: ! SULFA  ! ZITHROMAX (AZITHROMYCIN TABS)    * Latex Sensitive: No    Procedure:     Procedure: EGD    Diagnoses: GASTRIC POLYP HX OF (ICD-V12.79)      Procedure: Colonoscopy    Diagnoses: HX of colon polyps    Diagnoses: Family HX of Colon cancer      Special Instructions: **Pt emailed to [email or web address] and Suprep to  [employer] at [phone]**    Patient Information:     Name: Wanda Rowe    DOB: 03-04-1960    SSN: [id number]    Address: [address]  [place], [place]  [address]    Gender: Female    Home phone: [phone]    Work phone: [phone]    IDX #: [id number]    Last Word #: [id number]    PCP: Dr. Gari Crown     Insurance Information:     Primary Insurance: Darcel Smalling    Member ID #: [id number]

## 2010-07-14 ENCOUNTER — Inpatient Hospital Stay

## 2010-07-14 NOTE — Unmapped (Signed)
Signed by   LinkLogic on 07/14/2010 at 21:06:33  Patient: Wanda Rowe  Note: All result statuses are Final unless otherwise noted.    Tests: (1)  (MR)    Order Note:                                         [facility]     PATIENT NAME:   GAYLON, MELCHOR.                   MR #:  [id number]  DATE OF BIRTH:  May 02, 1960                        ACCOUNT #:  [id number]  PHYSICIAN:      Lurene Shadow, M.D.          ROOM #:  SDS  SERVICE:        Gastroenterology                  NURSING UNIT:  MSDS  PRIMARY:        Uriyah K. Hovermale, M.D.            FCSalena Saner  REFERRING:      Lurene Shadow, M.D.          ADMIT DATE:  07/14/2010  DICTATED BY:    Lurene Shadow, M.D.          PROCEDURE DATE:  07/14/2010                                                    DISCHARGE DATE:                                    ENDOSCOPY REPORT     *-*-*     PROCEDURES PERFORMED:     1.  Upper and lower endoscopy.     HISTORY:  This is a 50 year old woman who has a history of gastric polyps  including one with high-grade dysplasia removed in the past as well as a  history of colon polyps who is here for surveillance of both of these issues.     ANESTHESIA:  Sedation was administered by the anesthesiology service using  MAC anesthesia.     DETAILS OF PROCEDURE:  First a gastroscope was passed from the mouth as far  as the second portion of the duodenum.  The esophagus, Z-line,  gastroesophageal junction, proximal and mid stomach are entirely normal.  In  the antrum, there is one area of mild nodularity, but there is no evidence of  any apparent polyp or obvious neoplasia.  The prepyloric area,  pylorus, and  proximal duodenum were normal.  The gastroscope was then removed and a  colonoscope advanced from the anus as far as the cecum as identified by the  ileocecal valve and appendiceal orifice.  The quality of the bowel  preparation was excellent.  There is no evidence of any proctitis, colitis,  ileitis, vascular lesions, or any  neoplasia.  A couple scattered small  diverticula were seen.     IMPRESSION:     1.  Essentially normal  upper and lower endoscopy.  2.  Would plan a surveillance upper and lower endoscopy in five years, which    we will arrange.        *-*-*                                              _______________________________________  NS/cmb                                 _____  D:  07/14/2010 12:52                   Lurene Shadow, M.D.  T:  07/14/2010 20:58  Job #:  [id number]     c:   Dulcie K. Hovermale, M.D.                                     ENDOSCOPY REPORT                                                               PAGE    1 of   1    Note: An exclamation mark (!) indicates a result that was not dispersed into   the flowsheet.  Document Creation Date: 07/14/2010 9:06 PM  _______________________________________________________________________    (1) Order result status: Final  Collection or observation date-time: 07/14/2010 00:00  Requested date-time:   Receipt date-time:   Reported date-time:   Referring Physician: Lurene Shadow  Ordering Physician:  Reviewed In Hospital [facility])  Specimen Source:   Source: DBS  Filler Order Number: [id number]  Lab site:

## 2010-07-14 NOTE — Unmapped (Signed)
[facility]     PATIENT NAME:   Wanda Rowe, Wanda Rowe                   MR #:  [id number]   DATE OF BIRTH:  08/25/1960                        ACCOUNT #:  [id number]   PHYSICIAN:      Lurene Shadow, M.D.          ROOM #:  SDS   SERVICE:        Gastroenterology                  NURSING UNIT:  MSDS   PRIMARY:        Cristie K. Hovermale, M.D.            FCSalena Saner   REFERRING:      Lurene Shadow, M.D.          ADMIT DATE:  07/14/2010   DICTATED BY:    Lurene Shadow, M.D.          PROCEDURE DATE:  07/14/2010                                                     DISCHARGE DATE:                                    ENDOSCOPY REPORT     *-*-*     PROCEDURES PERFORMED:     1.  Upper and lower endoscopy.     HISTORY:  This is a 51 year old woman who has a history of gastric polyps   including one with high-grade dysplasia removed in the past as well as a   history of colon polyps who is here for surveillance of both of these issues.     ANESTHESIA:  Sedation was administered by the anesthesiology service using   MAC anesthesia.     DETAILS OF PROCEDURE:  First a gastroscope was passed from the mouth as far   as the second portion of the duodenum.  The esophagus, Z-line,   gastroesophageal junction, proximal and mid stomach are entirely normal.  In   the antrum, there is one area of mild nodularity, but there is no evidence of   any apparent polyp or obvious neoplasia.  The prepyloric area,  pylorus, and   proximal duodenum were normal.  The gastroscope was then removed and a   colonoscope advanced from the anus as far as the cecum as identified by the   ileocecal valve and appendiceal orifice.  The quality of the bowel   preparation was excellent.  There is no evidence of any proctitis, colitis,   ileitis, vascular lesions, or any neoplasia.  A couple scattered small   diverticula were seen.     IMPRESSION:     1.  Essentially normal upper and lower endoscopy.   2.  Would plan a surveillance  upper and lower endoscopy in five years, which     we will arrange.       *-*-*  _______________________________________   NS/cmb                                 _____   D:  07/14/2010 12:52                   Lurene Shadow, M.D.   T:  07/14/2010 20:58   Job #:  [id number]     c:   Constancia K. Hovermale, M.D.                                     ENDOSCOPY REPORT                                                                PAGE    1 of   1   D:  07/14/2010 12:52                   Lurene Shadow, M.D.   T:  07/14/2010 20:58   Job #:  [id number]     c:   Lucendia K. Hovermale, M.D.                                     ENDOSCOPY REPORT                                                                PAGE    1 of   1

## 2010-09-30 ENCOUNTER — Encounter

## 2010-10-16 MED ORDER — metFORMIN (GLUCOPHAGE) 1000 MG tablet
1000 | ORAL_TABLET | ORAL | Status: AC
Start: 2010-10-16 — End: 2010-10-27

## 2010-10-16 NOTE — Unmapped (Signed)
Needs labs and appt in november

## 2010-10-20 LAB — COMPREHENSIVE METABOLIC PANEL
A/G Ratio: 1.8 (calc) (ref 1.0–2.1)
ALT: 45 U/L (ref 6–40)
AST: 36 U/L (ref 10–35)
Albumin: 4.6 g/dL (ref 3.6–5.1)
Alkaline Phosphatase: 70 U/L (ref 33–115)
BUN: 16 mg/dL (ref 7–25)
CO2: 26 mmol/L (ref 21–33)
Calcium: 10.1 mg/dL (ref 8.6–10.2)
Chloride: 100 mmol/L (ref 98–110)
Creatinine: 0.65 mg/dL (ref 0.50–1.10)
GFR MDRD Af Amer: 121 mL/min/{1.73_m2} (ref >=60)
Globulin, Total: 2.5 g/dL (calc) (ref 2.2–3.9)
Glucose: 130 mg/dL (ref 65–99)
Potassium: 4.5 mmol/L (ref 3.5–5.3)
Sodium: 139 mmol/L (ref 135–146)
Total Bilirubin: 0.7 mg/dL (ref 0.2–1.2)
Total Protein: 7.1 g/dL (ref 6.2–8.3)
eGFR Non-Afr. American: 104 mL/min/{1.73_m2} (ref >=60)

## 2010-10-20 LAB — LIPID PANEL
Chol/HDL Ratio: 4.5 (calc) (ref <=5.0)
Cholesterol, Total: 231 mg/dL — ABNORMAL HIGH (ref 125–200)
HDL: 51 mg/dL (ref >=46)
LDL Calculated: 121 mg/dL (ref <130)
Non HDL Chol. (LDL+VLDL): 180 mg/dL
Triglycerides: 293 mg/dL — ABNORMAL HIGH (ref <150)

## 2010-10-20 LAB — HEMOGLOBIN A1C: Hemoglobin A1C: 6.3 % of total Hgb (ref <5.7)

## 2010-10-20 LAB — TSH: TSH: 2.18 mIU/L

## 2010-10-27 ENCOUNTER — Ambulatory Visit: Admit: 2010-10-27 | Discharge: 2010-10-27 | Payer: PRIVATE HEALTH INSURANCE

## 2010-10-27 DIAGNOSIS — G589 Mononeuropathy, unspecified: Secondary | ICD-10-CM

## 2010-10-27 MED ORDER — metFORMIN (GLUCOPHAGE) 1000 MG tablet
1000 | ORAL_TABLET | Freq: Two times a day (BID) | ORAL | Status: AC
Start: 2010-10-27 — End: 2011-02-10

## 2010-10-27 MED ORDER — olmesartan-hydrochlorothiazide (BENICAR HCT) 20-12.5 mg per tablet
20-12.5 | ORAL_TABLET | Freq: Every day | ORAL | 0.00 refills | 60.00000 days | Status: AC
Start: 2010-10-27 — End: 2011-02-10

## 2010-10-27 MED ORDER — omeprazole (PRILOSEC) 20 MG capsule
20 | ORAL_CAPSULE | Freq: Every day | ORAL | Status: AC
Start: 2010-10-27 — End: 2011-02-10

## 2010-10-27 MED ORDER — levothyroxine (SYNTHROID) 88 MCG tablet
88 | ORAL_TABLET | Freq: Every day | ORAL | Status: AC
Start: 2010-10-27 — End: 2011-05-07

## 2010-10-27 MED ORDER — ipratropium (ATROVENT) 0.03 % nasal spray
21 | Freq: Two times a day (BID) | NASAL | 1.00 refills | 30.00000 days | Status: AC
Start: 2010-10-27 — End: 2011-02-10

## 2010-10-27 MED ORDER — valACYclovir (VALTREX) 500 MG tablet
500 | ORAL_TABLET | Freq: Every day | ORAL | Status: AC
Start: 2010-10-27 — End: 2011-02-10

## 2010-10-27 MED ORDER — escitalopram (LEXAPRO) 10 MG tablet
10 | ORAL_TABLET | Freq: Every day | ORAL | 1.00 refills | 30.00000 days | Status: AC
Start: 2010-10-27 — End: 2011-02-10

## 2010-10-27 MED ORDER — ergocalciferol (VITAMIN D2) 50,000 unit capsule
1250 | ORAL_CAPSULE | ORAL | Status: AC
Start: 2010-10-27 — End: 2011-02-10

## 2010-10-27 MED ORDER — gemfibrozil (LOPID) 600 MG tablet
600 | ORAL_TABLET | Freq: Two times a day (BID) | ORAL | 0.00 refills | 30.00000 days | Status: AC
Start: 2010-10-27 — End: 2011-02-10

## 2010-10-27 MED ORDER — meloxicam (MOBIC) 15 MG tablet
15 | ORAL_TABLET | Freq: Every day | ORAL | Status: AC
Start: 2010-10-27 — End: 2011-02-10

## 2010-10-27 MED ORDER — gabapentin (NEURONTIN) 300 MG capsule
300 | ORAL_CAPSULE | Freq: Three times a day (TID) | ORAL | Status: AC
Start: 2010-10-27 — End: 2011-02-10

## 2010-10-27 MED ORDER — montelukast (SINGULAIR) 10 mg tablet
10 | ORAL_TABLET | Freq: Every evening | ORAL | Status: AC
Start: 2010-10-27 — End: 2011-02-10

## 2010-10-27 NOTE — Unmapped (Signed)
Cont metformin

## 2010-10-27 NOTE — Unmapped (Signed)
Lexapro? cymbalta

## 2010-10-27 NOTE — Unmapped (Signed)
neurontin consider switch to cymbalta

## 2010-10-27 NOTE — Unmapped (Signed)
lifestyle

## 2010-10-27 NOTE — Unmapped (Signed)
Addended byGari Crown on: 10/27/2010 09:24 AM     Modules accepted: Orders

## 2010-10-27 NOTE — Unmapped (Signed)
Subjective  HPI:   Patient ID: Wanda Rowe is a 50 y.o. female.    Chief Complaint:  HPI Comments: F/u dm2, htn, thyroid chol, lfts didn't take lopid didn't know what for, f/u allergies. C/o lbp started sat    Back Pain                    ROS:   Review of Systems   Constitutional: Negative for fatigue.   Musculoskeletal: Positive for back pain.   All other systems reviewed and are negative.           Objective:   Physical Exam   Constitutional: She appears well-developed and well-nourished.   HENT:   Head: Normocephalic.   Right Ear: External ear normal.   Left Ear: External ear normal.   Mouth/Throat: Oropharynx is clear and moist.   Cardiovascular: Normal rate, regular rhythm, normal heart sounds and intact distal pulses.    Pulmonary/Chest: Effort normal and breath sounds normal.   Musculoskeletal: Normal range of motion. She exhibits no edema.             Filed Vitals:    10/27/10 0852   BP: 136/84   Pulse: 64   Temp: 98.6 ??F (37 ??C)   TempSrc: Oral   Resp: 16   Height: 5' 8 (1.727 m)   Weight: 265 lb (120.203 kg)     Body mass index is 40.29 kg/(m^2).  Body surface area is 2.40 meters squared.                Assessment/Plan:     Patient Active Problem List   Diagnoses   ??? Mononeuritis of Unspecified Site   ??? DM w/o Complication Type II   ??? Nonspecific elevation of levels of transaminase or lactic acid dehydrogenase (LDH)   ??? Personal history of other diseases of digestive disease   ??? Routine general medical examination at a health care facility   ??? Unspecified vitamin D deficiency   ??? Other chronic sinusitis   ??? Pure Hypercholesterolemia   ??? Pure Hyperglyceridemia   ??? Anxiety State, Unspecified   ??? Unspecified Essential Hypertension   ??? Unspecified Asthma   ??? Other Abnormal Blood Chemistry   ??? Acute pharyngitis

## 2010-10-27 NOTE — Unmapped (Signed)
Cont benicar

## 2010-10-27 NOTE — Unmapped (Signed)
Cont d

## 2011-02-02 LAB — LIPID PANEL
Chol/HDL Ratio: 3.3 (calc) (ref <=5.0)
Cholesterol, Total: 181 mg/dL (ref 125–200)
HDL: 55 mg/dL (ref >=46)
LDL Calculated: 95 mg/dL (calc) (ref <130)
Non HDL Chol. (LDL+VLDL): 126 mg/dL (calc)
Triglycerides: 153 mg/dL (ref <150)

## 2011-02-02 LAB — COMPREHENSIVE METABOLIC PANEL
A/G Ratio: 1.7 (calc) (ref 1.0–2.5)
ALT: 79 U/L (ref 6–40)
AST: 53 U/L (ref 10–35)
Albumin: 4.9 g/dL (ref 3.6–5.1)
Alkaline Phosphatase: 80 U/L (ref 33–130)
BUN: 13 mg/dL (ref 7–25)
CO2: 22 mmol/L (ref 21–33)
Calcium: 10.2 mg/dL (ref 8.6–10.4)
Chloride: 100 mmol/L (ref 98–110)
Creatinine: 0.67 mg/dL (ref 0.50–1.05)
GFR MDRD Af Amer: 119 mL/min/{1.73_m2} (ref >=60)
Globulin, Total: 2.9 g/dL (calc) (ref 1.9–3.7)
Glucose: 197 mg/dL (ref 65–99)
Potassium: 4.1 mmol/L (ref 3.5–5.3)
Sodium: 137 mmol/L (ref 135–146)
Total Bilirubin: 0.4 mg/dL (ref 0.2–1.2)
Total Protein: 7.8 g/dL (ref 6.1–8.1)
eGFR Non-Afr. American: 102 mL/min/{1.73_m2} (ref >=60)

## 2011-02-02 LAB — VITAMIN D 25 HYDROXY
25-Hydroxy, Vitamin D-2: 35 ng/mL
25-Hydroxy, Vitamin D-3: 14 ng/mL
Vit D, 25-Hydroxy: 49 ng/mL (ref 30–100)

## 2011-02-02 LAB — TSH: TSH: 1.94 mIU/L

## 2011-02-02 LAB — HEMOGLOBIN A1C: Hemoglobin A1C: 7.9 % of total Hgb (ref <5.7)

## 2011-02-10 ENCOUNTER — Ambulatory Visit: Admit: 2011-02-10 | Discharge: 2011-02-10 | Payer: PRIVATE HEALTH INSURANCE

## 2011-02-10 DIAGNOSIS — E119 Type 2 diabetes mellitus without complications: Secondary | ICD-10-CM

## 2011-02-10 LAB — POCT URINALYSIS DIPSTICK, NONAUTOMATED; W/O MICRO
POCT - ALT (SGPT): 1.02 (ref 0–55)
POCT - Bilirubin, UA: NEGATIVE
POCT - Blood, UA: NEGATIVE
POCT - Glucose, UA: NEGATIVE
POCT - Ketones, UA: NEGATIVE
POCT - Leukocytes Esterase, UA: NEGATIVE
POCT - Nitrite, UA: NEGATIVE
POCT - Urobilinogen, UA: NEGATIVE
POCT - pH, UA: 5

## 2011-02-10 MED ORDER — olmesartan-hydrochlorothiazide (BENICAR HCT) 20-12.5 mg per tablet
20-12.5 | ORAL_TABLET | Freq: Every day | ORAL | 0.00 refills | 60.00000 days | Status: AC
Start: 2011-02-10 — End: 2011-05-07

## 2011-02-10 MED ORDER — fluticasone (FLONASE) 50 mcg/actuation nasal spray
50 | Freq: Every evening | NASAL | Status: AC
Start: 2011-02-10 — End: 2011-05-07

## 2011-02-10 MED ORDER — ergocalciferol (VITAMIN D2) 50,000 unit capsule
1250 | ORAL_CAPSULE | ORAL | Status: AC
Start: 2011-02-10 — End: 2011-05-07

## 2011-02-10 MED ORDER — gabapentin (NEURONTIN) 300 MG capsule
300 | ORAL_CAPSULE | Freq: Three times a day (TID) | ORAL | Status: AC
Start: 2011-02-10 — End: 2011-05-07

## 2011-02-10 MED ORDER — montelukast (SINGULAIR) 10 mg tablet
10 | ORAL_TABLET | Freq: Every evening | ORAL | Status: AC
Start: 2011-02-10 — End: 2011-05-07

## 2011-02-10 MED ORDER — metFORMIN (GLUCOPHAGE) 1000 MG tablet
1000 | ORAL_TABLET | Freq: Two times a day (BID) | ORAL | Status: AC
Start: 2011-02-10 — End: 2011-05-07

## 2011-02-10 MED ORDER — ciprofloxacin (CIPRO) 500 MG tablet
500 | ORAL_TABLET | Freq: Every day | ORAL | Status: AC
Start: 2011-02-10 — End: 2011-05-07

## 2011-02-10 MED ORDER — valACYclovir (VALTREX) 500 MG tablet
500 | ORAL_TABLET | Freq: Every day | ORAL | Status: AC
Start: 2011-02-10 — End: 2011-07-28

## 2011-02-10 MED ORDER — escitalopram (LEXAPRO) 10 MG tablet
10 | ORAL_TABLET | Freq: Every day | ORAL | 1.00 refills | 30.00000 days | Status: AC
Start: 2011-02-10 — End: 2011-05-07

## 2011-02-10 MED ORDER — fenofibrate micronized (LOFIBRA) 134 MG capsule
134 | ORAL_CAPSULE | Freq: Every morning | ORAL | Status: AC
Start: 2011-02-10 — End: 2011-05-07

## 2011-02-10 MED ORDER — ipratropium (ATROVENT) 0.03 % nasal spray
21 | Freq: Two times a day (BID) | NASAL | 1.00 refills | 30.00000 days | Status: AC
Start: 2011-02-10 — End: 2011-05-07

## 2011-02-10 MED ORDER — diabetic supplies, miscellan. Misc
3.00 refills | 30.00000 days | Status: AC
Start: 2011-02-10 — End: 2011-07-28

## 2011-02-10 NOTE — Unmapped (Signed)
Instructed pt to continue current meds

## 2011-02-10 NOTE — Unmapped (Signed)
Monitor at home my readings low but hard to hear 104/80

## 2011-02-10 NOTE — Unmapped (Signed)
Switch to fenofibrate

## 2011-02-10 NOTE — Unmapped (Signed)
Wean valtrex no sxs x 4 yrs

## 2011-02-10 NOTE — Unmapped (Signed)
cipro

## 2011-02-10 NOTE — Unmapped (Signed)
Lifestyle no change in meds

## 2011-02-10 NOTE — Unmapped (Signed)
Subjective  HPI:   Patient ID: Wanda Rowe is a 51 y.o. female.    Chief Complaint:  HPI Comments: C/o urinary frequency pain at urethra, tenderness suprapubic area worse as day goes on. Gets up at night. Has had a tah. F/u dm2, htn, hsv 1, neuropathy, allergies, tg. bs up since lopid. C/o chest tenderness not worse w exertion lying flat, meals possibly worse w/ mvt    Hypertension    Diabetes  Pertinent negatives for diabetes include no fatigue.   Urinary Tract Infection                     ROS:   Review of Systems   Constitutional: Negative for fatigue.   All other systems reviewed and are negative.           Objective:   Physical Exam   Constitutional: She is oriented to person, place, and time. She appears well-developed and well-nourished.   HENT:   Head: Normocephalic and atraumatic.   Cardiovascular: Normal rate, regular rhythm and normal heart sounds.    Pulmonary/Chest: Effort normal and breath sounds normal.   Neurological: She is oriented to person, place, and time.   Psychiatric: She has a normal mood and affect.             Filed Vitals:    02/10/11 0823   BP: 136/88   Pulse: 80   Temp: 98.7 ??F (37.1 ??C)   TempSrc: Oral   Resp: 16   Height: 5' 8 (1.727 m)   Weight: 264 lb (119.75 kg)     Body mass index is 40.14 kg/(m^2).  Body surface area is 2.40 meters squared.                Assessment/Plan:     Patient Active Problem List   Diagnoses   ??? Mononeuritis of Unspecified Site   ??? DM w/o Complication Type II   ??? Nonspecific elevation of levels of transaminase or lactic acid dehydrogenase (LDH)   ??? Personal history of other diseases of digestive disease   ??? Routine general medical examination at a health care facility   ??? Unspecified vitamin D deficiency   ??? Other chronic sinusitis   ??? Anxiety State, Unspecified   ??? Unspecified Essential Hypertension   ??? Unspecified Asthma   ??? Other Abnormal Blood Chemistry   ??? Acute pharyngitis   ??? Hypertriglyceridemia   ??? Fibromyalgia muscle pain   ??? mobic   ??? Pure  hypercholesterolemia

## 2011-05-04 LAB — COMPREHENSIVE METABOLIC PANEL
A/G Ratio: 2 (calc) (ref 1.0–2.5)
ALT: 77 U/L (ref 6–40)
AST: 46 U/L (ref 10–35)
Albumin: 4.7 g/dL (ref 3.6–5.1)
Alkaline Phosphatase: 63 U/L (ref 33–130)
BUN: 22 mg/dL (ref 7–25)
CO2: 28 mmol/L (ref 19–30)
Calcium: 10.1 mg/dL (ref 8.6–10.4)
Chloride: 103 mmol/L (ref 98–110)
Creatinine: 0.78 mg/dL (ref 0.50–1.05)
GFR MDRD Af Amer: 103 mL/min/{1.73_m2} (ref >=60)
Globulin, Total: 2.3 g/dL (calc) (ref 1.9–3.7)
Glucose: 147 mg/dL (ref 65–99)
Potassium: 4.5 mmol/L (ref 3.5–5.3)
Sodium: 140 mmol/L (ref 135–146)
Total Bilirubin: 0.4 mg/dL (ref 0.2–1.2)
Total Protein: 7 g/dL (ref 6.1–8.1)
eGFR Non-Afr. American: 89 mL/min/{1.73_m2} (ref >=60)

## 2011-05-04 LAB — TSH: TSH: 0.62 mIU/L

## 2011-05-04 LAB — VITAMIN D 25 HYDROXY
25-Hydroxy, Vitamin D-2: 40 ng/mL
25-Hydroxy, Vitamin D-3: 14 ng/mL
Vit D, 25-Hydroxy: 54 ng/mL (ref 30–100)

## 2011-05-04 LAB — LIPID PANEL
Chol/HDL Ratio: 3.1 (calc) (ref <=5.0)
Cholesterol, Total: 155 mg/dL (ref 125–200)
HDL: 50 mg/dL (ref >=46)
LDL Calculated: 82 mg/dL (calc) (ref <130)
Non HDL Chol. (LDL+VLDL): 105 mg/dL (calc)
Triglycerides: 117 mg/dL (ref <150)

## 2011-05-04 LAB — HEMOGLOBIN A1C: Hemoglobin A1C: 7.5 % of total Hgb (ref <5.7)

## 2011-05-07 ENCOUNTER — Ambulatory Visit: Admit: 2011-05-07 | Discharge: 2011-05-07 | Payer: PRIVATE HEALTH INSURANCE

## 2011-05-07 DIAGNOSIS — N390 Urinary tract infection, site not specified: Secondary | ICD-10-CM

## 2011-05-07 LAB — TEST IN QUESTION-$MISLABELED NAME (70100): Test Ordered on Req:: 4558

## 2011-05-07 LAB — GENITAL CULTURE

## 2011-05-07 LAB — SPECIMEN ID NOTIFICATION MISSING SECOND ID

## 2011-05-07 MED ORDER — olmesartan-hydrochlorothiazide (BENICAR HCT) 20-12.5 mg per tablet
20-12.5 | ORAL_TABLET | Freq: Every day | ORAL | 0.00 refills | 60.00000 days | Status: AC
Start: 2011-05-07 — End: 2011-07-28

## 2011-05-07 MED ORDER — ipratropiumATROVENT003nasalspray
21 | Freq: Two times a day (BID) | NASAL | 1.00 refills | 30.00000 days | Status: AC
Start: 2011-05-07 — End: 2011-07-28

## 2011-05-07 MED ORDER — metFORMIN (GLUCOPHAGE) 1000 MG tablet
1000 | ORAL_TABLET | Freq: Two times a day (BID) | ORAL | Status: AC
Start: 2011-05-07 — End: 2011-07-28

## 2011-05-07 MED ORDER — levothyroxine (SYNTHROID) 88 MCG tablet
88 | ORAL_TABLET | Freq: Every day | ORAL | Status: AC
Start: 2011-05-07 — End: 2011-07-28

## 2011-05-07 MED ORDER — fenofibrate micronized (LOFIBRA) 134 MG capsule
134 | ORAL_CAPSULE | Freq: Every morning | ORAL | Status: AC
Start: 2011-05-07 — End: 2011-07-28

## 2011-05-07 MED ORDER — montelukast (SINGULAIR) 10 mg tablet
10 | ORAL_TABLET | Freq: Every evening | ORAL | Status: AC
Start: 2011-05-07 — End: 2011-07-28

## 2011-05-07 MED ORDER — gabapentin (NEURONTIN) 300 MG capsule
300 | ORAL_CAPSULE | Freq: Three times a day (TID) | ORAL | Status: AC
Start: 2011-05-07 — End: 2011-07-28

## 2011-05-07 MED ORDER — fluticasone (FLONASE) 50 mcg/actuation nasal spray
50 | Freq: Every evening | NASAL | Status: AC
Start: 2011-05-07 — End: 2011-11-26

## 2011-05-07 MED ORDER — ergocalciferol (VITAMIN D2) 50,000 unit capsule
1250 | ORAL_CAPSULE | ORAL | Status: AC
Start: 2011-05-07 — End: 2011-07-28

## 2011-05-07 MED ORDER — escitalopram (LEXAPRO) 10 MG tablet
10 | ORAL_TABLET | Freq: Every day | ORAL | 1.00 refills | 30.00000 days | Status: AC
Start: 2011-05-07 — End: 2011-07-28

## 2011-05-07 MED ORDER — phentermine-topiramate 3.75-23 mg CM24
3.75-23 | ORAL_CAPSULE | Freq: Every day | ORAL | Status: AC
Start: 2011-05-07 — End: 2011-07-28

## 2011-05-07 MED ORDER — phentermine-topiramate 7.5-46 mg CM24
7.5-46 | ORAL_CAPSULE | Freq: Every day | ORAL | Status: AC
Start: 2011-05-07 — End: 2011-07-28

## 2011-05-07 NOTE — Unmapped (Signed)
Instructed pt to continue current meds

## 2011-05-07 NOTE — Unmapped (Signed)
fenofibrate

## 2011-05-07 NOTE — Unmapped (Signed)
Subjective  HPI:   Patient ID: Wanda Rowe is a 51 y.o. female.    Chief Complaint:  HPI Comments: F/u on dm2, htn, lipids, elevated lfts, neuropathy bs running high wants to get serious re wt loss is considering lap band. Has tear in right hip ortho will not repair until looses wt.     Hypertension    Diabetes  Pertinent negatives for diabetes include no fatigue.                   ROS:   Review of Systems   Constitutional: Negative for fatigue.   All other systems reviewed and are negative.           Objective:   Physical Exam   Constitutional: She appears well-developed and well-nourished.   Cardiovascular: Normal rate, regular rhythm and normal heart sounds.    Pulmonary/Chest: Effort normal and breath sounds normal.   Abdominal: Soft. Bowel sounds are normal.   Psychiatric: She has a normal mood and affect. Her behavior is normal.             Filed Vitals:    05/07/11 0922   BP: 128/76   Pulse: 80   Temp: 98.3 ??F (36.8 ??C)   TempSrc: Oral   Resp: 16   Height: 5' 8 (1.727 m)   Weight: 262 lb (118.842 kg)     Body mass index is 39.84 kg/(m^2).  Body surface area is 2.39 meters squared.                Assessment/Plan:     Patient Active Problem List   Diagnosis   ??? Mononeuritis of Unspecified Site   ??? DM w/o Complication Type II   ??? Nonspecific elevation of levels of transaminase or lactic acid dehydrogenase (LDH)   ??? Personal history of other diseases of digestive disease   ??? Routine general medical examination at a health care facility   ??? Unspecified vitamin D deficiency   ??? Other chronic sinusitis   ??? Anxiety State, Unspecified   ??? Unspecified Essential Hypertension   ??? Unspecified Asthma   ??? Other Abnormal Blood Chemistry   ??? Acute pharyngitis   ??? Hypertriglyceridemia   ??? Fibromyalgia muscle pain   ??? mobic   ??? Hyperlipidemia   ??? Urinary tract infection   ??? HSV-1 infection   ??? Urethritis

## 2011-05-07 NOTE — Unmapped (Signed)
qsymia

## 2011-05-14 NOTE — Unmapped (Signed)
[facility], called pt. Left message informing her of this

## 2011-05-14 NOTE — Unmapped (Signed)
I don't see this medication on the patient med list

## 2011-05-14 NOTE — Unmapped (Signed)
[facility]symia new [facility], [facility], ins won't pay for thru [facility], please resubmit.

## 2011-05-14 NOTE — Unmapped (Signed)
Can you do?

## 2011-07-24 LAB — LIPID PANEL
Chol/HDL Ratio: 3.2 (calc) (ref <=5.0)
Cholesterol, Total: 181 mg/dL (ref 125–200)
HDL: 56 mg/dL (ref >=46)
LDL Calculated: 87 mg/dL (calc) (ref <130)
Non HDL Chol. (LDL+VLDL): 125 mg/dL (calc)
Triglycerides: 189 mg/dL (ref <150)

## 2011-07-24 LAB — CBC
Hematocrit: 37.4 % (ref 35.0–45.0)
Hemoglobin: 12.6 g/dL (ref 11.7–15.5)
MCH: 30.9 pg (ref 27.0–33.0)
MCHC: 33.8 g/dL (ref 32.0–36.0)
MCV: 91.5 fL (ref 80.0–100.0)
Platelets: 221 Thousand/uL (ref 140–400)
RBC: 4.09 Million/uL (ref 3.80–5.10)
RDW: 13.9 % (ref 11.0–15.0)
WBC: 7.1 Thousand/uL (ref 3.8–10.8)

## 2011-07-24 LAB — TSH: TSH: 1.87 mIU/L

## 2011-07-24 LAB — HEMOGLOBIN A1C: Hemoglobin A1C: 7 % of total Hgb (ref <5.7)

## 2011-07-28 ENCOUNTER — Ambulatory Visit: Admit: 2011-07-28 | Discharge: 2011-07-28 | Payer: PRIVATE HEALTH INSURANCE

## 2011-07-28 DIAGNOSIS — E119 Type 2 diabetes mellitus without complications: Secondary | ICD-10-CM

## 2011-07-28 MED ORDER — phentermine-topiramate 7.5-46 mg CM24
7.5-46 | ORAL_CAPSULE | Freq: Every day | ORAL | Status: AC
Start: 2011-07-28 — End: 2011-09-03

## 2011-07-28 MED ORDER — diabetic supplies, miscellan. Misc
3.00 refills | 30.00000 days | Status: AC
Start: 2011-07-28 — End: 2011-10-28

## 2011-07-28 MED ORDER — ergocalciferol (VITAMIN D2) 50,000 unit capsule
1250 | ORAL_CAPSULE | ORAL | Status: AC
Start: 2011-07-28 — End: 2011-10-28

## 2011-07-28 MED ORDER — levothyroxine (SYNTHROID) 88 MCG tablet
88 | ORAL_TABLET | Freq: Every day | ORAL | Status: AC
Start: 2011-07-28 — End: 2011-10-28

## 2011-07-28 MED ORDER — montelukast (SINGULAIR) 10 mg tablet
10 | ORAL_TABLET | Freq: Every evening | ORAL | Status: AC
Start: 2011-07-28 — End: 2011-10-28

## 2011-07-28 MED ORDER — amoxicillin-clavulanate (AUGMENTIN) 875-125 mg per tablet
875-125 | ORAL_TABLET | Freq: Two times a day (BID) | ORAL | Status: AC
Start: 2011-07-28 — End: 2011-08-19

## 2011-07-28 MED ORDER — fenofibrate micronized (LOFIBRA) 134 MG capsule
134 | ORAL_CAPSULE | Freq: Every morning | ORAL | Status: AC
Start: 2011-07-28 — End: 2011-10-28

## 2011-07-28 MED ORDER — gabapentin (NEURONTIN) 300 MG capsule
300 | ORAL_CAPSULE | Freq: Three times a day (TID) | ORAL | Status: AC
Start: 2011-07-28 — End: 2011-10-28

## 2011-07-28 MED ORDER — valACYclovir (VALTREX) 500 MG tablet
500 | ORAL_TABLET | Freq: Every day | ORAL | Status: AC
Start: 2011-07-28 — End: 2013-08-03

## 2011-07-28 MED ORDER — metFORMIN (GLUCOPHAGE) 1000 MG tablet
1000 | ORAL_TABLET | Freq: Two times a day (BID) | ORAL | Status: AC
Start: 2011-07-28 — End: 2011-10-28

## 2011-07-28 MED ORDER — olmesartan-hydrochlorothiazide (BENICAR HCT) 20-12.5 mg per tablet
20-12.5 | ORAL_TABLET | Freq: Every day | ORAL | 0.00 refills | 60.00000 days | Status: AC
Start: 2011-07-28 — End: 2011-10-28

## 2011-07-28 MED ORDER — ipratropium (ATROVENT) 0.03 % nasal spray
21 | Freq: Two times a day (BID) | NASAL | 1.00 refills | 30.00000 days | Status: AC
Start: 2011-07-28 — End: 2012-08-11

## 2011-07-28 MED ORDER — escitalopram (LEXAPRO) 10 MG tablet
10 | ORAL_TABLET | Freq: Every day | ORAL | 1.00 refills | 30.00000 days | Status: AC
Start: 2011-07-28 — End: 2011-10-28

## 2011-07-28 NOTE — Unmapped (Signed)
Subjective  HPI:   Patient ID: Wanda Rowe is a 51 y.o. female.    Chief Complaint:  HPI Comments: Started qsymia 3 weeks ago having some tingling in hands and trouble sleeping stools more constipation. ses are mild she states. Taking neurontin bid. Also f/u on dm2, lipids, above     Hypertension    Diabetes  Pertinent negatives for diabetes include no fatigue.                   ROS:   Review of Systems   Constitutional: Negative for fatigue.   All other systems reviewed and are negative.           Objective:   Physical Exam   Constitutional: She appears well-developed and well-nourished.   Cardiovascular: Normal rate and regular rhythm.    Pulmonary/Chest: Effort normal and breath sounds normal.   Musculoskeletal: Normal range of motion.   Psychiatric: She has a normal mood and affect.             Filed Vitals:    07/28/11 0826   BP: 122/84   Pulse: 84   Temp: 97.4 ??F (36.3 ??C)   TempSrc: Oral   Resp: 16   Height: 5' 8 (1.727 m)   Weight: 256 lb (116.121 kg)     Body mass index is 38.92 kg/(m^2).  Body surface area is 2.36 meters squared.                Assessment/Plan:     Patient Active Problem List   Diagnosis   ??? Mononeuritis of Unspecified Site   ??? DM w/o Complication Type II   ??? Nonspecific elevation of levels of transaminase or lactic acid dehydrogenase (LDH)   ??? Personal history of other diseases of digestive disease   ??? Routine general medical examination at a health care facility   ??? Unspecified vitamin D deficiency   ??? Other chronic sinusitis   ??? Anxiety State, Unspecified   ??? Unspecified Essential Hypertension   ??? Unspecified Asthma   ??? Other Abnormal Blood Chemistry   ??? Acute pharyngitis   ??? Hypertriglyceridemia   ??? Fibromyalgia muscle pain   ??? mobic   ??? Hyperlipidemia   ??? Urinary tract infection   ??? HSV-1 infection   ??? Urethritis   ??? Obesity

## 2011-07-28 NOTE — Unmapped (Signed)
Instructed pt to continue current meds

## 2011-08-19 ENCOUNTER — Ambulatory Visit: Admit: 2011-08-19 | Discharge: 2011-08-19 | Payer: PRIVATE HEALTH INSURANCE

## 2011-08-19 DIAGNOSIS — N390 Urinary tract infection, site not specified: Secondary | ICD-10-CM

## 2011-08-19 LAB — POCT URINALYSIS DIPSTICK, AUTOMATED W/O MICRO
POCT - ALT (SGPT): NEGATIVE (ref 0–55)
POCT - Bilirubin, UA: NEGATIVE
POCT - Blood, UA: NEGATIVE
POCT - Glucose, UA: NEGATIVE
POCT - Ketones, UA: NEGATIVE
POCT - Leukocytes Esterase, UA: NEGATIVE
POCT - Nitrite, UA: NEGATIVE
POCT - Other Cells, UA: NEGATIVE
POCT - Protein, UA: NEGATIVE
POCT - Urobilinogen, UA: NEGATIVE
POCT - pH, UA: NEGATIVE

## 2011-08-19 NOTE — Unmapped (Signed)
Finish cipro

## 2011-08-19 NOTE — Unmapped (Signed)
Subjective  HPI:   Patient ID: Wanda Rowe is a 51 y.o. female.    Chief Complaint:  HPI Comments: 5 days ago developed urinary frequency pelvic pain fever back pain started cipro Monday has had 5 days of cipro much better. Seen in urgent care in [place] on sat and started on meds. Feeling better today levaes for [place] in the morning    Urinary Tract Infection                     ROS:   Review of Systems   Constitutional: Negative for fatigue.   All other systems reviewed and are negative.           Objective:   Physical Exam   Constitutional: She appears well-developed and well-nourished.   Cardiovascular: Normal rate and regular rhythm.    Pulmonary/Chest: Effort normal and breath sounds normal.   Abdominal: Soft. Bowel sounds are normal.             Filed Vitals:    08/19/11 1217   BP: 112/74   Pulse: 80   Temp: 98.9 ??F (37.2 ??C)   TempSrc: Oral   Resp: 16   Height: 5' 8 (1.727 m)   Weight: 250 lb (113.399 kg)     Body mass index is 38.01 kg/(m^2).  Body surface area is 2.33 meters squared.                Assessment/Plan:     Patient Active Problem List   Diagnosis   ??? Mononeuritis of Unspecified Site   ??? Type II or unspecified type diabetes mellitus without mention of complication, not stated as uncontrolled   ??? Nonspecific elevation of levels of transaminase or lactic acid dehydrogenase (LDH)   ??? Personal history of other diseases of digestive disease   ??? Routine general medical examination at a health care facility   ??? Unspecified vitamin D deficiency   ??? Other chronic sinusitis   ??? Anxiety state, unspecified   ??? Unspecified essential hypertension   ??? Unspecified asthma   ??? Other Abnormal Blood Chemistry   ??? Acute pharyngitis   ??? Hypertriglyceridemia   ??? Fibromyalgia muscle pain   ??? mobic   ??? Hyperlipidemia   ??? Urinary tract infection   ??? HSV-1 infection   ??? Urethritis   ??? Hypothyroid   ??? Obesity

## 2011-09-03 ENCOUNTER — Ambulatory Visit: Admit: 2011-09-03 | Discharge: 2011-09-03 | Payer: PRIVATE HEALTH INSURANCE

## 2011-09-03 DIAGNOSIS — E669 Obesity, unspecified: Secondary | ICD-10-CM

## 2011-09-03 MED ORDER — phentermine 37.5 MG capsule
37.5 | ORAL_CAPSULE | Freq: Every morning | ORAL | Status: AC
Start: 2011-09-03 — End: 2011-09-17

## 2011-09-03 NOTE — Unmapped (Signed)
Subjective  HPI:   Patient ID: Wanda Rowe is a 51 y.o. female.    Chief Complaint:  HPI Comments: Has been on qsymia x 8 weeks and no help.                   ROS:   Review of Systems   Constitutional: Negative for fatigue.   All other systems reviewed and are negative.           Objective:   Physical Exam   Constitutional: She is oriented to person, place, and time. She appears well-developed and well-nourished.   Cardiovascular: Normal rate and regular rhythm.    Musculoskeletal: Normal range of motion.   Neurological: She is oriented to person, place, and time.   Psychiatric: She has a normal mood and affect.             Filed Vitals:    09/03/11 1547   BP: 126/78   Pulse: 72   Temp: 98 ??F (36.7 ??C)   TempSrc: Oral   Resp: 12   Height: 5' 8 (1.727 m)   Weight: 252 lb (114.306 kg)     Body mass index is 38.32 kg/(m^2).  Body surface area is 2.34 meters squared.                Assessment/Plan:     Patient Active Problem List   Diagnosis   ??? Mononeuritis of Unspecified Site   ??? Type II or unspecified type diabetes mellitus without mention of complication, not stated as uncontrolled   ??? Nonspecific elevation of levels of transaminase or lactic acid dehydrogenase (LDH)   ??? Personal history of other diseases of digestive disease   ??? Routine general medical examination at a health care facility   ??? Unspecified vitamin D deficiency   ??? Other chronic sinusitis   ??? Anxiety state, unspecified   ??? Unspecified essential hypertension   ??? Unspecified asthma   ??? Other Abnormal Blood Chemistry   ??? Acute pharyngitis   ??? Hypertriglyceridemia   ??? Fibromyalgia muscle pain   ??? mobic   ??? Hyperlipidemia   ??? Urinary tract infection   ??? HSV-1 infection   ??? Urethritis   ??? Hypothyroid   ??? Obesity

## 2011-09-03 NOTE — Unmapped (Signed)
Switch to adipex

## 2011-09-17 MED ORDER — HYDROcodone-acetaminophen (VICODIN) 5-500 mg per tablet
5-500 | ORAL_TABLET | Freq: Four times a day (QID) | ORAL | 0.00 refills | 15.50000 days | Status: AC | PRN
Start: 2011-09-17 — End: 2011-10-28

## 2011-09-17 NOTE — Unmapped (Signed)
Med reaction to phentermine? Just started 8/14 & started terrible h/a, highly stressed & co workers were telling her she was acting weird. 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 Still have h/a since 8/14 & otc not helping, rx for? [facility] [address] [facility]/don't want any other diet meds. Allergic to sulfa

## 2011-09-17 NOTE — Unmapped (Signed)
Check bp and if up call back for appt can call in med for pain dont take any more phenteramine

## 2011-09-17 NOTE — Unmapped (Signed)
Advised pt. Of below message, rx called to [facility]

## 2011-10-26 LAB — LIPID PANEL
Chol/HDL Ratio: 3.6 (calc) (ref <=5.0)
Cholesterol, Total: 205 mg/dL (ref 125–200)
HDL: 57 mg/dL (ref >=46)
LDL Calculated: 121 mg/dL (calc) (ref <130)
Non HDL Chol. (LDL+VLDL): 148 mg/dL (calc)
Triglycerides: 136 mg/dL (ref <150)

## 2011-10-26 LAB — COMPREHENSIVE METABOLIC PANEL
A/G Ratio: 2 (calc) (ref 1.0–2.5)
ALT: 63 U/L (ref 6–29)
AST: 49 U/L (ref 10–35)
Albumin: 4.7 g/dL (ref 3.6–5.1)
Alkaline Phosphatase: 51 U/L (ref 33–130)
BUN: 16 mg/dL (ref 7–25)
CO2: 26 mmol/L (ref 19–30)
Calcium: 9.7 mg/dL (ref 8.6–10.4)
Chloride: 102 mmol/L (ref 98–110)
Creatinine: 0.75 mg/dL (ref 0.50–1.05)
GFR MDRD Af Amer: 108 mL/min/{1.73_m2} (ref >=60)
Globulin, Total: 2.4 g/dL (calc) (ref 1.9–3.7)
Glucose: 151 mg/dL (ref 65–99)
Potassium: 4.5 mmol/L (ref 3.5–5.3)
Sodium: 140 mmol/L (ref 135–146)
Total Bilirubin: 0.6 mg/dL (ref 0.2–1.2)
Total Protein: 7.1 g/dL (ref 6.1–8.1)
eGFR Non-Afr. American: 93 mL/min/{1.73_m2} (ref >=60)

## 2011-10-26 LAB — TSH: TSH: 0.95 mIU/L

## 2011-10-26 LAB — HEMOGLOBIN A1C: Hemoglobin A1C: 6.8 % of total Hgb (ref <5.7)

## 2011-10-28 ENCOUNTER — Ambulatory Visit: Admit: 2011-10-28 | Discharge: 2011-10-28 | Payer: PRIVATE HEALTH INSURANCE

## 2011-10-28 DIAGNOSIS — E039 Hypothyroidism, unspecified: Secondary | ICD-10-CM

## 2011-10-28 MED ORDER — olmesartan-hydrochlorothiazide (BENICAR HCT) 20-12.5 mg per tablet
20-12.5 | ORAL_TABLET | Freq: Every day | ORAL | 0.00 refills | 60.00000 days | Status: AC
Start: 2011-10-28 — End: 2012-02-15

## 2011-10-28 MED ORDER — levothyroxine (SYNTHROID) 88 MCG tablet
88 | ORAL_TABLET | Freq: Every day | ORAL | Status: AC
Start: 2011-10-28 — End: 2012-04-26

## 2011-10-28 MED ORDER — gabapentin (NEURONTIN) 300 MG capsule
300 | ORAL_CAPSULE | Freq: Three times a day (TID) | ORAL | Status: AC
Start: 2011-10-28 — End: 2012-02-08

## 2011-10-28 MED ORDER — silver sulfADIAZINE (SILVADENE) 1 % cream
1 | Freq: Every day | TOPICAL | Status: AC
Start: 2011-10-28 — End: 2012-01-19

## 2011-10-28 MED ORDER — metFORMIN (GLUCOPHAGE) 1000 MG tablet
1000 | ORAL_TABLET | Freq: Two times a day (BID) | ORAL | Status: AC
Start: 2011-10-28 — End: 2012-01-19

## 2011-10-28 MED ORDER — montelukast (SINGULAIR) 10 mg tablet
10 | ORAL_TABLET | Freq: Every evening | ORAL | Status: AC
Start: 2011-10-28 — End: 2012-04-26

## 2011-10-28 MED ORDER — diabetic supplies, miscellan. Misc
3.00 refills | 30.00000 days | Status: AC
Start: 2011-10-28 — End: 2012-01-26

## 2011-10-28 MED ORDER — ALPRAZolam (XANAX) 0.25 MG tablet
0.25 | ORAL_TABLET | Freq: Three times a day (TID) | ORAL | Status: AC | PRN
Start: 2011-10-28 — End: 2012-01-19

## 2011-10-28 MED ORDER — blood sugar diagnostic (ACCU-CHEK AVIVA PLUS) Strp
ORAL_STRIP | 0.00 refills | 30.00000 days | Status: AC
Start: 2011-10-28 — End: 2012-01-26

## 2011-10-28 MED ORDER — fenofibrate micronized (LOFIBRA) 134 MG capsule
134 | ORAL_CAPSULE | Freq: Every morning | ORAL | Status: AC
Start: 2011-10-28 — End: 2012-01-19

## 2011-10-28 MED ORDER — ergocalciferol (VITAMIN D2) 50,000 unit capsule
1250 | ORAL_CAPSULE | ORAL | Status: AC
Start: 2011-10-28 — End: 2012-04-26

## 2011-10-28 MED ORDER — lorcaserin (BELVIQ) 10 mg Tab
10 | ORAL_TABLET | Freq: Two times a day (BID) | ORAL | 0.00 refills | 30.00000 days | Status: AC
Start: 2011-10-28 — End: 2012-01-19

## 2011-10-28 MED ORDER — escitalopram (LEXAPRO) 10 MG tablet
10 | ORAL_TABLET | Freq: Every day | ORAL | 1.00 refills | 30.00000 days | Status: AC
Start: 2011-10-28 — End: 2012-04-26

## 2011-10-28 NOTE — Unmapped (Signed)
Instructed pt to continue current meds

## 2011-10-28 NOTE — Unmapped (Signed)
Subjective  HPI:   Patient ID: Wanda Rowe is a 51 y.o. female.    Chief Complaint:  HPI Comments: F/u above has more stress because 38 yo daughter going through phase broke up with long term boyfriend in may    Diabetes  Pertinent negatives for diabetes include no fatigue.              Medications:  Current Outpatient Prescriptions   Medication Sig Dispense Refill   ??? albuterol (PROVENTIL HFA;VENTOLIN HFA) 90 mcg/actuation inhaler Inhale 1-2 puffs into the lungs every 6 (six) hours as needed. Q4-6H PRN        ??? blood sugar diagnostic (ACCU-CHEK AVIVA PLUS) Strp fsbs qd  100 strip  1   ??? blood-glucose meter (GLUCOSE MONITORING KIT) kit by Other route 2 (two) times daily. Use as instructed - fsbs bid        ??? CALCIUM CITRATE ORAL Take 1 tablet by mouth daily.         ??? diabetic supplies, miscellan. Misc **LANCETs  Fsbs bid  100 each  3   ??? ergocalciferol (VITAMIN D2) 50,000 unit capsule Take 1 capsule (50,000 Units total) by mouth once a week.  4 capsule  5   ??? escitalopram (LEXAPRO) 10 MG tablet Take 1 tablet (10 mg total) by mouth daily.  30 tablet  5   ??? fenofibrate micronized (LOFIBRA) 134 MG capsule Take 1 capsule (134 mg total) by mouth every morning before breakfast.  30 capsule  5   ??? fluticasone (FLONASE) 50 mcg/actuation nasal spray 2 sprays by Nasal route at bedtime.  16 g  3   ??? gabapentin (NEURONTIN) 300 MG capsule Take 1 capsule (300 mg total) by mouth 3 times a day.  90 capsule  5   ??? ipratropium (ATROVENT) 0.03 % nasal spray 2 sprays by Nasal route 2 times a day.  30 mL  5   ??? levothyroxine (SYNTHROID) 88 MCG tablet Take 1 tablet (88 mcg total) by mouth daily.  30 tablet  5   ??? metFORMIN (GLUCOPHAGE) 1000 MG tablet Take 1 tablet (1,000 mg total) by mouth 2 times a day with meals.  60 tablet  5   ??? montelukast (SINGULAIR) 10 mg tablet Take 1 tablet (10 mg total) by mouth at bedtime.  30 tablet  5   ??? olmesartan-hydrochlorothiazide (BENICAR HCT) 20-12.5 mg per tablet Take 1 tablet by mouth daily.  30  tablet  5   ??? valACYclovir (VALTREX) 500 MG tablet Take 1 tablet (500 mg total) by mouth daily.  60 tablet  3   ??? DISCONTD: ACCU-CHEK AVIVA PLUS Strp        ??? DISCONTD: diabetic supplies, miscellan. Misc **LANCETS, TEST STRIPS  Fsbs bid  100 each  3   ??? DISCONTD: ergocalciferol (VITAMIN D2) 50,000 unit capsule Take 1 capsule (50,000 Units total) by mouth once a week.  4 capsule  5   ??? DISCONTD: escitalopram (LEXAPRO) 10 MG tablet Take 1 tablet (10 mg total) by mouth daily.  30 tablet  5   ??? DISCONTD: fenofibrate micronized (LOFIBRA) 134 MG capsule Take 1 capsule (134 mg total) by mouth every morning before breakfast.  30 capsule  5   ??? DISCONTD: gabapentin (NEURONTIN) 300 MG capsule Take 1 capsule (300 mg total) by mouth 3 times a day.  90 capsule  5   ??? DISCONTD: levothyroxine (SYNTHROID) 88 MCG tablet Take 1 tablet (88 mcg total) by mouth daily.  30 tablet  5   ??? DISCONTD: metFORMIN (GLUCOPHAGE) 1000 MG tablet Take 1 tablet (1,000 mg total) by mouth 2 times a day with meals.  60 tablet  5   ??? DISCONTD: montelukast (SINGULAIR) 10 mg tablet Take 1 tablet (10 mg total) by mouth at bedtime.  30 tablet  5   ??? DISCONTD: olmesartan-hydrochlorothiazide (BENICAR HCT) 20-12.5 mg per tablet Take 1 tablet by mouth daily.  30 tablet  5   ??? ALPRAZolam (XANAX) 0.25 MG tablet Take 1 tablet (0.25 mg total) by mouth 3 times a day as needed for Anxiety.  90 tablet  1   ??? lorcaserin (BELVIQ) 10 mg Tab Take 1 tablet by mouth 2 times a day.  30 tablet  0   ??? lorcaserin (BELVIQ) 10 mg Tab Take 10 mg by mouth 2 times a day.  60 tablet  0   ??? silver sulfADIAZINE (SILVADENE) 1 % cream Apply topically daily.  50 g  0   ??? DISCONTD: HYDROcodone-acetaminophen (VICODIN) 5-500 mg per tablet Take 1 tablet by mouth every 6 hours as needed for Pain.  10 tablet  0        ROS:   Review of Systems   Constitutional: Negative for fatigue.   All other systems reviewed and are negative.           Objective:   Physical Exam   Constitutional: She appears  well-developed and well-nourished.   Cardiovascular: Normal rate and regular rhythm.    Pulmonary/Chest: Effort normal and breath sounds normal.   Psychiatric: She has a normal mood and affect.             Filed Vitals:    10/28/11 0806   BP: 112/76   Pulse: 80   Resp: 16   Height: 5' 8 (1.727 m)   Weight: 254 lb (115.214 kg)     Body mass index is 38.62 kg/(m^2).  Body surface area is 2.35 meters squared.                Assessment/Plan:     Patient Active Problem List   Diagnosis   ??? Type II or unspecified type diabetes mellitus without mention of complication, not stated as uncontrolled   ??? Routine general medical examination at a health care facility   ??? Unspecified vitamin D deficiency   ??? Anxiety state, unspecified   ??? Unspecified essential hypertension   ??? Unspecified asthma   ??? Hyperlipidemia   ??? Hypothyroid   ??? Obesity

## 2011-10-28 NOTE — Unmapped (Signed)
Add xanax

## 2011-10-28 NOTE — Unmapped (Signed)
belviq

## 2011-11-26 MED ORDER — fluticasone (FLONASE) 50 mcg/actuation nasal spray
50 | Freq: Every evening | NASAL | Status: AC
Start: 2011-11-26 — End: 2012-04-28

## 2012-01-15 LAB — LIPID PANEL
Chol/HDL Ratio: 3.5 (calc) (ref <=5.0)
Cholesterol, Total: 186 mg/dL (ref 125–200)
HDL: 53 mg/dL (ref >=46)
LDL Calculated: 103 mg/dL (calc) (ref <130)
Non HDL Chol. (LDL+VLDL): 133 mg/dL (calc)
Triglycerides: 152 mg/dL (ref <150)

## 2012-01-15 LAB — COMPREHENSIVE METABOLIC PANEL
A/G Ratio: 1.8 (calc) (ref 1.0–2.5)
ALT: 118 U/L (ref 6–29)
AST: 88 U/L (ref 10–35)
Albumin: 4.8 g/dL (ref 3.6–5.1)
Alkaline Phosphatase: 52 U/L (ref 33–130)
BUN: 17 mg/dL (ref 7–25)
CO2: 24 mmol/L (ref 19–30)
Calcium: 10.2 mg/dL (ref 8.6–10.4)
Chloride: 103 mmol/L (ref 98–110)
Creatinine: 0.63 mg/dL (ref 0.50–1.05)
GFR MDRD Af Amer: 120 mL/min/{1.73_m2} (ref >=60)
Globulin, Total: 2.7 g/dL (calc) (ref 1.9–3.7)
Glucose: 149 mg/dL (ref 65–99)
Potassium: 4.2 mmol/L (ref 3.5–5.3)
Sodium: 140 mmol/L (ref 135–146)
Total Bilirubin: 0.5 mg/dL (ref 0.2–1.2)
Total Protein: 7.5 g/dL (ref 6.1–8.1)
eGFR Non-Afr. American: 104 mL/min/{1.73_m2} (ref >=60)

## 2012-01-15 LAB — HEMOGLOBIN A1C: Hemoglobin A1C: 7.5 % of total Hgb (ref <5.7)

## 2012-01-15 LAB — TSH: TSH: 1.17 mIU/L

## 2012-01-15 LAB — HIGH SENSITIVITY CRP: Cardio CRP??: 2 mg/L

## 2012-01-19 ENCOUNTER — Ambulatory Visit: Admit: 2012-01-19 | Discharge: 2012-01-19 | Payer: PRIVATE HEALTH INSURANCE

## 2012-01-19 DIAGNOSIS — R748 Abnormal levels of other serum enzymes: Secondary | ICD-10-CM

## 2012-01-19 MED ORDER — metFORMIN (GLUCOPHAGE) 500 MG tablet
500 | ORAL_TABLET | Freq: Two times a day (BID) | ORAL | Status: AC
Start: 2012-01-19 — End: 2012-02-15

## 2012-01-19 NOTE — Unmapped (Signed)
Decrease met to 2-3 daily when goes on diet restriction

## 2012-01-19 NOTE — Unmapped (Signed)
Hold meds due to upcoming surg

## 2012-01-19 NOTE — Unmapped (Signed)
Instructed pt to continue current meds

## 2012-01-19 NOTE — Unmapped (Signed)
Us/recheck stop fenofibrate

## 2012-01-19 NOTE — Unmapped (Signed)
Get w/u for elevated lfts f/u preop

## 2012-01-19 NOTE — Unmapped (Signed)
Subjective  HPI:   Patient ID: Wanda Rowe is a 51 y.o. female.    Chief Complaint:  HPI Comments: F/u above also getting gastric sleeve needs clearance    Hypertension    Diabetes  Pertinent negatives for diabetes include no fatigue.              Medications:  Current Outpatient Prescriptions   Medication Sig Dispense Refill   ??? albuterol (PROVENTIL HFA;VENTOLIN HFA) 90 mcg/actuation inhaler Inhale 1-2 puffs into the lungs every 6 (six) hours as needed. Q4-6H PRN        ??? blood sugar diagnostic (ACCU-CHEK AVIVA PLUS) Strp fsbs qd  100 strip  1   ??? blood-glucose meter (GLUCOSE MONITORING KIT) kit by Other route 2 (two) times daily. Use as instructed - fsbs bid        ??? CALCIUM CITRATE ORAL Take 1 tablet by mouth daily.         ??? diabetic supplies, miscellan. Misc **LANCETs  Fsbs bid  100 each  3   ??? ergocalciferol (VITAMIN D2) 50,000 unit capsule Take 1 capsule (50,000 Units total) by mouth once a week.  4 capsule  5   ??? escitalopram (LEXAPRO) 10 MG tablet Take 1 tablet (10 mg total) by mouth daily.  30 tablet  5   ??? fluticasone (FLONASE) 50 mcg/actuation nasal spray 2 sprays by Nasal route at bedtime.  16 g  3   ??? gabapentin (NEURONTIN) 300 MG capsule Take 1 capsule (300 mg total) by mouth 3 times a day.  90 capsule  5   ??? ipratropium (ATROVENT) 0.03 % nasal spray 2 sprays by Nasal route 2 times a day.  30 mL  5   ??? levothyroxine (SYNTHROID) 88 MCG tablet Take 1 tablet (88 mcg total) by mouth daily.  30 tablet  5   ??? metFORMIN (GLUCOPHAGE) 500 MG tablet Take 1 tablet (500 mg total) by mouth 2 times a day with meals. 2 am, 1 hs  90 tablet  3   ??? montelukast (SINGULAIR) 10 mg tablet Take 1 tablet (10 mg total) by mouth at bedtime.  30 tablet  5   ??? olmesartan-hydrochlorothiazide (BENICAR HCT) 20-12.5 mg per tablet Take 1 tablet by mouth daily.  30 tablet  5   ??? valACYclovir (VALTREX) 500 MG tablet Take 1 tablet (500 mg total) by mouth daily.  60 tablet  3   ??? [DISCONTINUED] fenofibrate micronized (LOFIBRA) 134 MG  capsule Take 1 capsule (134 mg total) by mouth every morning before breakfast.  30 capsule  5   ??? [DISCONTINUED] metFORMIN (GLUCOPHAGE) 1000 MG tablet Take 1 tablet (1,000 mg total) by mouth 2 times a day with meals.  60 tablet  5   ??? [DISCONTINUED] ALPRAZolam (XANAX) 0.25 MG tablet Take 1 tablet (0.25 mg total) by mouth 3 times a day as needed for Anxiety.  90 tablet  1   ??? [DISCONTINUED] lorcaserin (BELVIQ) 10 mg Tab Take 1 tablet by mouth 2 times a day.  30 tablet  0   ??? [DISCONTINUED] lorcaserin (BELVIQ) 10 mg Tab Take 10 mg by mouth 2 times a day.  60 tablet  0   ??? [DISCONTINUED] silver sulfADIAZINE (SILVADENE) 1 % cream Apply topically daily.  50 g  0     No current facility-administered medications for this visit.        ROS:   Review of Systems   Constitutional: Negative for fatigue.   All other systems reviewed and  are negative.           Objective:   Physical Exam   Constitutional: She appears well-developed and well-nourished.   HENT:   Head: Normocephalic and atraumatic.   Right Ear: External ear normal.   Left Ear: External ear normal.   Cardiovascular: Normal rate and regular rhythm.    Pulmonary/Chest: Effort normal and breath sounds normal.   Psychiatric: She has a normal mood and affect.             Filed Vitals:    01/19/12 0812   BP: 118/76   Pulse: 72   Resp: 12   Height: 5' 8 (1.727 m)   Weight: 260 lb (117.935 kg)     Body mass index is 39.54 kg/(m^2).  Body surface area is 2.38 meters squared.                Assessment/Plan:     Patient Active Problem List   Diagnosis   ??? Type II or unspecified type diabetes mellitus without mention of complication, not stated as uncontrolled   ??? Routine general medical examination at a health care facility   ??? Unspecified vitamin D deficiency   ??? Anxiety state, unspecified   ??? Unspecified essential hypertension   ??? Unspecified asthma   ??? Hyperlipidemia   ??? Hypothyroid   ??? Obesity

## 2012-01-20 ENCOUNTER — Inpatient Hospital Stay: Admit: 2012-01-20 | Payer: PRIVATE HEALTH INSURANCE

## 2012-01-20 DIAGNOSIS — R7402 Elevation of levels of lactic acid dehydrogenase (LDH): Secondary | ICD-10-CM

## 2012-01-25 LAB — HEPATIC FUNCTION PANEL
A/G Ratio: 1.8 (calc) (ref 1.0–2.5)
ALT: 80 U/L — ABNORMAL HIGH (ref 6–29)
AST: 48 U/L — ABNORMAL HIGH (ref 10–35)
Albumin: 4.7 g/dL (ref 3.6–5.1)
Alkaline Phosphatase: 78 U/L (ref 33–130)
Bilirubin, Direct: 0.1 mg/dL (ref <=0.2)
Bilirubin, Indirect: 0.2 mg/dL (ref 0.2–1.2)
Globulin, Total: 2.6 g/dL (ref 1.9–3.7)
Total Bilirubin: 0.3 mg/dL (ref 0.2–1.2)
Total Protein: 7.3 g/dL (ref 6.1–8.1)

## 2012-01-25 LAB — ACUTE HEPATITIS PANEL
Hep A Total Ab: NONREACTIVE
Hep B Core Total Ab: NONREACTIVE
Hep B Surface Ag: NONREACTIVE
Hepatitis B Surface Antibody Ql: NONREACTIVE
Hepatitis C Antibody: NONREACTIVE
Signal/Cutoff: 0.02 (ref <1.00)

## 2012-01-26 ENCOUNTER — Ambulatory Visit: Admit: 2012-01-26 | Discharge: 2012-01-26 | Payer: PRIVATE HEALTH INSURANCE

## 2012-01-26 DIAGNOSIS — N390 Urinary tract infection, site not specified: Secondary | ICD-10-CM

## 2012-01-26 MED ORDER — blood sugar diagnostic (ACCU-CHEK AVIVA PLUS) Strp
ORAL_STRIP | 0.00 refills | 30.00000 days | Status: AC
Start: 2012-01-26 — End: 2012-04-28

## 2012-01-26 MED ORDER — diabetic supplies, miscellan. Misc
3.00 refills | 30.00000 days | Status: AC
Start: 2012-01-26 — End: 2012-04-28

## 2012-01-26 NOTE — Unmapped (Signed)
Ok for planned procedure

## 2012-01-26 NOTE — Unmapped (Signed)
Instructed pt to continue current meds

## 2012-01-26 NOTE — Unmapped (Signed)
Off meds recheck post op

## 2012-01-26 NOTE — Unmapped (Signed)
Improved since holding fenofibrate, ruq US shows fatty liver

## 2012-01-26 NOTE — Unmapped (Signed)
Subjective  HPI:   Patient ID: Wanda Rowe is a 51 y.o. female.    Chief Complaint:  HPI Comments: Preop consultation for weight loss surgery.    Past Medical History:    Hyperlipidemia                                                Hypertension                                                  Diabetes mellitus                                               Comment:Type 2    Hypothyroidism                                                GERD (gastroesophageal reflux disease)                        Polyp of stomach                                              RSD (reflex sympathetic dystrophy)                              Comment:R knee    Anxiety                                                       Asthma                                                          Comment:Mild    Past Surgical History:    ANTERIOR CRUCIATE LIGAMENT REPAIR                                Comment:L     MENISCECTOMY                                                     Comment:L x 2, R x 1    DENTAL SURGERY  Comment:Periodontal     HYSTERECTOMY                                                   FOOT SURGERY                                                   Social History    Marital Status: Single               Number of children:1               Occupational History  Occupation                 Pensions consultant                                    Social History Main Topics    Smoking Status: Never Smoker                      Smokeless Status: Never Used                        Comment: (06/23/2006)    Alcohol Use: Yes                Comment: Occasionally (06/23/2006)    Drug Use: Not on file     Sexual Activity: Not on file          Other Topics            Concern  Caffeine Use            No  Exercise                No            Review of patient's family history indicates:    Hypertension                   Mother                    Other                          Mother                      Comment:  DDD    Diabetes                       Father                      Comment: Type 2    Hypertension                   Father                    Factor VIII deficiency         Father                    Other  Brother                     Comment: Back Pain    Diabetes                       Maternal Grandmother        Comment: Type 2    Cancer                         Maternal Grandmother        Comment: Bladder CA    Stroke                         Maternal Grandmother      Colon cancer                   Maternal Grandfather      Breast cancer                  Paternal Grandmother        Current Outpatient Prescriptions on File Prior to Visit:  albuterol (PROVENTIL HFA;VENTOLIN HFA) 90 mcg/actuation inhaler, Inhale 1-2 puffs into the lungs every 6 (six) hours as needed. Q4-6H PRN , Disp: , Rfl:   blood sugar diagnostic (ACCU-CHEK AVIVA PLUS) Strp, fsbs qd, Disp: 100 strip, Rfl: 1  blood-glucose meter (GLUCOSE MONITORING KIT) kit, by Other route 2 (two) times daily. Use as instructed - fsbs bid , Disp: , Rfl:   CALCIUM CITRATE ORAL, Take 1 tablet by mouth daily.  , Disp: , Rfl:   diabetic supplies, miscellan. Misc, **LANCETsFsbs bid, Disp: 100 each, Rfl: 3  ergocalciferol (VITAMIN D2) 50,000 unit capsule, Take 1 capsule (50,000 Units total) by mouth once a week., Disp: 4 capsule, Rfl: 5  escitalopram (LEXAPRO) 10 MG tablet, Take 1 tablet (10 mg total) by mouth daily., Disp: 30 tablet, Rfl: 5  fluticasone (FLONASE) 50 mcg/actuation nasal spray, 2 sprays by Nasal route at bedtime., Disp: 16 g, Rfl: 3  gabapentin (NEURONTIN) 300 MG capsule, Take 1 capsule (300 mg total) by mouth 3 times a day., Disp: 90 capsule, Rfl: 5  ipratropium (ATROVENT) 0.03 % nasal spray, 2 sprays by Nasal route 2 times a day., Disp: 30 mL, Rfl: 5  levothyroxine (SYNTHROID) 88 MCG tablet, Take 1 tablet (88 mcg total) by mouth daily., Disp: 30 tablet, Rfl: 5  metFORMIN (GLUCOPHAGE) 500 MG tablet, Take 2 twice daily  montelukast  (SINGULAIR) 10 mg tablet, Take 1 tablet (10 mg total) by mouth at bedtime., Disp: 30 tablet, Rfl: 5  olmesartan-hydrochlorothiazide (BENICAR HCT) 20-12.5 mg per tablet, Take 1 tablet by mouth daily., Disp: 30 tablet, Rfl: 5  valACYclovir (VALTREX) 500 MG tablet, Take 1 tablet (500 mg total) by mouth daily., Disp: 60 tablet, Rfl: 3    No current facility-administered medications on file prior to visit.                   Medications:  Current Outpatient Prescriptions   Medication Sig Dispense Refill   ??? albuterol (PROVENTIL HFA;VENTOLIN HFA) 90 mcg/actuation inhaler Inhale 1-2 puffs into the lungs every 6 (six) hours as needed. Q4-6H PRN        ??? blood sugar diagnostic (ACCU-CHEK AVIVA PLUS) Strp fsbs qd  100 strip  1   ??? blood-glucose meter (GLUCOSE MONITORING KIT) kit by Other route 2 (two) times daily. Use as instructed - fsbs  bid        ??? CALCIUM CITRATE ORAL Take 1 tablet by mouth daily.         ??? diabetic supplies, miscellan. Misc **LANCETs  Fsbs bid  100 each  3   ??? ergocalciferol (VITAMIN D2) 50,000 unit capsule Take 1 capsule (50,000 Units total) by mouth once a week.  4 capsule  5   ??? escitalopram (LEXAPRO) 10 MG tablet Take 1 tablet (10 mg total) by mouth daily.  30 tablet  5   ??? fluticasone (FLONASE) 50 mcg/actuation nasal spray 2 sprays by Nasal route at bedtime.  16 g  3   ??? gabapentin (NEURONTIN) 300 MG capsule Take 1 capsule (300 mg total) by mouth 3 times a day.  90 capsule  5   ??? ipratropium (ATROVENT) 0.03 % nasal spray 2 sprays by Nasal route 2 times a day.  30 mL  5   ??? levothyroxine (SYNTHROID) 88 MCG tablet Take 1 tablet (88 mcg total) by mouth daily.  30 tablet  5   ??? metFORMIN (GLUCOPHAGE) 500 MG tablet Take 1 tablet (500 mg total) by mouth 2 times a day with meals. 2 am, 1 hs  90 tablet  3   ??? montelukast (SINGULAIR) 10 mg tablet Take 1 tablet (10 mg total) by mouth at bedtime.  30 tablet  5   ??? olmesartan-hydrochlorothiazide (BENICAR HCT) 20-12.5 mg per tablet Take 1 tablet by mouth daily.   30 tablet  5   ??? valACYclovir (VALTREX) 500 MG tablet Take 1 tablet (500 mg total) by mouth daily.  60 tablet  3     No current facility-administered medications for this visit.        ROS:   Review of Systems   Constitutional: Negative for fatigue.   All other systems reviewed and are negative.           Objective:   Physical Exam        General Appearance:  Alert, cooperative, no distress, appears stated age  Head:  Normocephalic, without obvious abnormality, atraumatic  Eyes:  PERRL, conjunctiva/corneas clear  Ears:  Normal TM's and external ear canals, both ears  Nose: Nares normal, septum midline,mucosa normal, no drainage or sinus tenderness  Throat: Lips, mucosa, and tongue normal; teeth and gums normal  Neck: Supple, symmetrical, trachea midline, no adenopathy;  thyroid: not enlarged, symmetric, no tenderness/mass/nodules; no carotid bruit or JVD  Back:   SymmetOM normal, no CVA tendernessric, no curvature, R  Lungs:   Clear to auscultation bilaterally, respirations unlabored  Breast: Deferred  Heart:  Regular rate and rhythm, S1 and S2 normal, no murmur, rub, or gallop  Abdomen:   Soft, non-tender, bowel sounds active all four quadrants,  no masses, no organomegaly  Pelvic: Deferred  Extremities: Extremities normal, atraumatic, no cyanosis or edema  Pulses: 2+ and symmetric  Skin: Skin color, texture, turgor normal, no rashes or lesions  Lymph nodes: Cervical, supraclavicular, and axillary nodes normal  Neurologic: Normal      Filed Vitals:    01/26/12 0923   BP: 134/82   Pulse: 80   Resp: 12   Height: 5' 8 (1.727 m)   Weight: 261 lb (118.389 kg)     Body mass index is 39.69 kg/(m^2).  Body surface area is 2.38 meters squared.                Assessment/Plan:     Patient Active Problem List   Diagnosis   ??? Type  II or unspecified type diabetes mellitus without mention of complication, not stated as uncontrolled   ??? Routine general medical examination at a health care facility   ??? Unspecified vitamin D  deficiency   ??? Anxiety state, unspecified   ??? Unspecified essential hypertension   ??? Unspecified asthma   ??? Hyperlipidemia   ??? Hypothyroid   ??? Obesity   ??? Elevated liver enzymes   ??? Diabetes mellitus

## 2012-01-26 NOTE — Unmapped (Signed)
Instructions given by wt loss surgeon

## 2012-02-03 DIAGNOSIS — C50919 Malignant neoplasm of unspecified site of unspecified female breast: Secondary | ICD-10-CM

## 2012-02-03 HISTORY — DX: Malignant neoplasm of unspecified site of unspecified female breast: C50.919

## 2012-02-03 HISTORY — PX: LAPAROSCOPIC GASTRIC SLEEVE RESECTION: SHX5895

## 2012-02-08 MED ORDER — gabapentin (NEURONTIN) 600 MG tablet
600 | ORAL_TABLET | Freq: Three times a day (TID) | ORAL | Status: AC
Start: 2012-02-08 — End: 2012-05-26

## 2012-02-08 NOTE — Unmapped (Signed)
See above note about the Neurontin    [facility]  [address] [employer]

## 2012-02-09 NOTE — Unmapped (Signed)
________________________________________________________________________________________________________________________       Your patient was seen at a [facility]. Please go to     [email or web address] to view information filed to your patient's chart in Epic.       If you need to view your patient's results prior to gaining access to Epic CareLink, please contact the [facility] where your patient was seen.    ________________________________________________________________________________________________________________________    H&P Notes  ________________________________________________________________________________________________________________________  bm  k  H&P signed by Cain Sieve, PA at 02/09/2012  7:08 AM        Author:      Cain Sieve, PA    Service:     General Surgery        Author Type: Physician Assistant        Filed:       02/09/2012  7:08 AM      Note Time:   02/09/2012  6:59 AM      Cosigner:    Joneen Boers, MD at                                                                                             02/09/2012 10:19 AM        Surgical weight-loss    Wanda Rowe    [id number]    Surgical Weight-loss H & P Update  < 30 Days since  the last completed FULL H & P  HPI: Inadequate weight-loss or Maintenance after / with years of dieting and exercise efforts.  Diagnosis: Morbid OBESITY-    Diagnosis #2. Skeletal-muscle pain @ LEFT Shoulder, Deltoid, and Anterior and Posterior LEFT chest muscles:  Patient  attributes muscle pain to recent shoveling and removal of snow  / ICE.    PMHX:  ________________________________________________________________________________________________________________________  Past Medical History  Diagnosis                                                                           Date  *   Hyperlipidemia  *   Diabetes mellitus  *   Sinus congestion  *   Asthma  *   Hypertension  *   MVP (mitral valve  prolapse)  *   GERD (gastroesophageal reflux disease)  *   Elevated liver enzymes          patient stopped fenofibrate last dose last Tuesday  *   Fibromyalgia  *   Arthritis  *   Anesthesia complication    Medication List:  ________________________________________________________________________________________________________________________  Prior to Admission medications  Medication                      Sig                     Start Date  End Date  Taking? Authorizing Provider  escitalopram (LEXAPRO) 10 MG    Take 5 mg by mouth                              Yes     Historical Provider, MD  tablet                          daily.  gabapentin (NEURONTIN) 300 MG   Take 300 mg by mouth 3                          Yes     Historical Provider, MD  capsule                         times daily.  olmesartan-hydrochlorothiazide  Take 1 tablet by mouth                          Yes     Historical Provider, MD  (BENICAR HCT) 20-12.5 MG per    daily.  tablet  ALBUTEROL IN                    Inhale 2 puffs into the                         Yes     Historical Provider, MD                                  lungs as needed.  Calcium Carbonate-Vitamin D     Take 3 tablets by mouth                                 Historical Provider, MD  (CALCIUM + D PO)                daily.  metFORMIN (GLUCOPHAGE) 500 MG   Take 500 mg by mouth 2                                  Historical Provider, MD  tablet                          times daily (with                                  meals).  Cholecalciferol (VITAMIN D3)    Take 1 capsule by mouth                                 [place]  [address] UNITS CAPS                every 7 days. Takes on                                  friday  montelukast (SINGULAIR) 10 MG   Take 10 mg  by mouth                                     Historical Provider, MD  tablet                          nightly.  valACYclovir (VALTREX) 500 MG   Take 500 mg by mouth as                                 Historical  Provider, MD  tablet                          needed.  ipratropium (ATROVENT) 0.03 %   2 sprays by Nasal route                                 Historical Provider, MD  nasal spray                     2 times daily.  fluticasone (FLONASE) 50        2 sprays by Nasal route                                 Historical Provider, MD  MCG/ACT nasal spray             nightly.  levothyroxine (SYNTHROID) 88    Take 88 mcg by mouth                                    Historical Provider, MD  MCG tablet                      Daily.  desoximetasone (TOPICORT) 0.25  Apply  topically as                                     Historical Provider, MD  % cream                         needed. Apply topically                                  2 times daily.      Consultations: Will be requested as needed for comprehensive care.  Social History:  Tobacco Use - denies-  ETOH use     -  social only-  EXAM: NAD / No new  Complaints, today.  Patient expresses understanding of today's planned surgical procedure:  Oral / pharynx-  No acute  Pharyngeal or gingival changes.  Neck- supple, midline trach, no auscultated bruits and no palpable LA.    Lungs- CTA ( BL ), No wheezing , Crackles, or Ronchi.    CV- RRR, No M / R / G.    Abdomen-  OBESE,  No appliances,  + BS,  ND / NT palpation.    SKIN-W / Dry -  Intact - no obvious Rashes.    Extremities- Symmetrical - no edema.    Neurologic:  Grossly Intact, clear speech, logical and appropriate responses to questions, pupils are round and equal.  Sensation to touch and BL Upper and Lower extremity movement are generally intact.  VS- BP 114/72   Pulse 79   Temp(Src) 98.8 ??????F (37.1 ??????C) (Oral)   Resp 18   Ht 5' 8 (1.727 m)   Wt 247 lb (112.038 kg)    BMI 37.56 kg/m2   SpO2 97%   Breastfeeding? No  Assessment:  52 y.o.  OBESE female, non-smoker, with a history of failure to lose weight or prevent weight-gain after  years of diet and exercise programs.  Plan:Laparoscopic SLEEVE Gastrectomy - possible  Hiatal HERNIA repair, today.    Continued weight loss and management as determined by the wt-loss surgeon, counselors  and their wt-mgmt staff.    Cain Sieve, [place] 02/09/2012 6:59 AM    bmk  ________________________________________________________________________________________________________________________        This document has removed original colors, tables and or formatting to be viewed in this system and is therefore  not the legal report and should not be relied on as the official report.  If there are any questions, the legal  report in the originating system should be viewed and used as the authoritative result.

## 2012-02-11 NOTE — Unmapped (Signed)
________________________________________________________________________________________________________________________       Your patient was seen at a [facility]. Please go to     [email or web address] to view information filed to your patient's chart in Epic.       If you need to view your patient's results prior to gaining access to Epic CareLink, please contact the [facility] where your patient was seen.    ________________________________________________________________________________________________________________________    Discharge Summary Notes  ________________________________________________________________________________________________________________________  bm  k  Discharge Summaries signed by Veatrice Bourbon, APRN at 02/11/2012  5:45 PM        Author:      Veatrice Bourbon, APRN   Service:     General Surgery        Author Type: Nurse Practitioner        Filed:       02/11/2012  5:45 PM      Note Time:   02/11/2012  5:43 PM      Cosigner:    Joneen Boers, MD at                                                                                             02/21/2012  5:29 PM      ________________________________________________________________________________________________________________________  Physician Discharge Summary    Patient ID:  Wanda Rowe  [id number]  52 y.o.  12/11/1960    Admit date: 02/09/2012    Discharge date and time: 02/10/2012 12:19 PM    Admitting Physician: Joneen Boers, MD    Discharge Physician: Dr. Clementeen Graham    Admission Diagnoses: 278.01 MORBID OBESITY    Discharge Diagnoses: morbid obesity    Admission Condition: fair    Discharged Condition: stable    Indication for Admission: Surgery:  Laparoscopic Sleeve Gastrectomy, IV hydration, monitoring, pain and nausea  management    Hospital Course: 52 y.o. female admitted with morbid obesity who underwent laparoscopic sleeve gastrectomy.  Surgery was  uneventful and she was admitted  to bariatric post-operative surgical floor in stable condition for IV hydration,  monitoring and pain and nausea management.  The following morning the pain was tolerable on po pain medication and was  taking adequate po.  She was discharged in stable condition.      Treatments: IV hydration    Discharge Exam:  Exam:BP 125/79   Pulse 75   Temp(Src) 99.1 ??????F (37.3 ??????C) (Oral)   Resp 18   Ht 5' 8 (1.727 m)   Wt 247 lb (112.038 kg)    BMI 37.56 kg/m2   SpO2 95%   Breastfeeding? No  General appearance: alert, appears stated age and cooperative  Lungs: clear to auscultation bilaterally  Heart: regular rate and rhythm, S1, S2  Abdomen: soft, appropriately-tender; bowel sounds present  Dressings clean dry and intact        Disposition: home    Patient Instructions:  ________________________________________________________________________________________________________________________  Wanda, Rowe  Home Medication Instructions                            ZOX:[id number]                                                          Printed on:02/11/12 1744  Medication Information    ALBUTEROL IN Inhale 2 puffs into the lungs as needed.    Calcium Carbonate-Vitamin D (CALCIUM + D PO) Take 3  tablets by mouth daily.    Cholecalciferol (VITAMIN D3) 50000 UNITS CAPS Take 1  capsule by mouth every 7 days. Takes on friday    desoximetasone (TOPICORT) 0.25 % cream Apply  topically as  needed. Apply topically 2 times daily.    escitalopram (LEXAPRO) 10 MG tablet Take 5 mg by mouth  daily.    fluticasone (FLONASE) 50 MCG/ACT nasal spray 2 sprays by  Nasal route nightly.    gabapentin (NEURONTIN) 300 MG capsule Take 300 mg by mouth  3 times daily.    ipratropium (ATROVENT) 0.03 % nasal spray 2 sprays by  Nasal route 2 times daily.    levothyroxine (SYNTHROID) 88 MCG tablet Take 88 mcg by  mouth Daily.    metFORMIN (GLUCOPHAGE) 500 MG tablet Take 500 mg by mouth  2 times daily  (with meals).    montelukast (SINGULAIR) 10 MG tablet Take 10 mg by mouth  nightly.    olmesartan-hydrochlorothiazide (BENICAR HCT) 20-12.5 MG  per tablet Take 1 tablet by mouth daily.    valACYclovir (VALTREX) 500 MG tablet Take 500 mg by mouth  as needed.      Activity: activity as tolerated and no driving while on analgesics  Diet: clear liquids  Wound Care: as directed    Follow-up with Dr. Clementeen Graham in two weeks.    Signed:  Veatrice Bourbon  02/11/2012  5:43 PM    bmk  ________________________________________________________________________________________________________________________        This document has removed original colors, tables and or formatting to be viewed in this system and is therefore  not the legal report and should not be relied on as the official report.  If there are any questions, the legal  report in the originating system should be viewed and used as the authoritative result.

## 2012-02-15 ENCOUNTER — Inpatient Hospital Stay: Admit: 2012-02-15 | Payer: PRIVATE HEALTH INSURANCE

## 2012-02-15 ENCOUNTER — Ambulatory Visit: Admit: 2012-02-15 | Payer: PRIVATE HEALTH INSURANCE

## 2012-02-15 DIAGNOSIS — M25569 Pain in unspecified knee: Secondary | ICD-10-CM

## 2012-02-15 LAB — URIC ACID: Uric Acid: 6.2 mg/dL (ref 2.5–7.0)

## 2012-02-15 LAB — SED RATE: Sed Rate: 44 mm/hr (ref 0–30)

## 2012-02-15 MED ORDER — indomethacin 25 mg/5 mL Susp
25 | Freq: Three times a day (TID) | ORAL | Status: AC
Start: 2012-02-15 — End: 2012-04-26

## 2012-02-15 MED ORDER — lansoprazole (PREVACID) 3 mg/ml Susp oral suspension
3 | Freq: Every day | ORAL | Status: AC
Start: 2012-02-15 — End: 2012-04-26

## 2012-02-15 MED ORDER — metFORMIN (GLUCOPHAGE) 500 MG tablet
500 | ORAL_TABLET | ORAL | Status: AC
Start: 2012-02-15 — End: 2012-04-26

## 2012-02-15 NOTE — Unmapped (Signed)
Subjective  HPI:   Patient ID: Wanda Rowe is a 52 y.o. female.    Chief Complaint:  HPI Comments: Had surgery jan 7th on liquid diet having left knee pain cannot walk feels like it is swollen. Knee pain started 1 week ago, 3 days after surgery. Had a doppler that was negative. Had 3 surgeries on l knee 1 on right started in back of the knee now lateral aspect feels tight. Stopped benicar bp ok taking glucophage 500 mg bid bs in lower 100s    Knee Pain                Medications:  Current Outpatient Prescriptions   Medication Sig Dispense Refill   ??? albuterol (PROVENTIL HFA;VENTOLIN HFA) 90 mcg/actuation inhaler Inhale 1-2 puffs into the lungs every 6 (six) hours as needed. Q4-6H PRN        ??? blood sugar diagnostic (ACCU-CHEK AVIVA PLUS) Strp fsbs qd  100 strip  1   ??? blood-glucose meter (GLUCOSE MONITORING KIT) kit by Other route 2 (two) times daily. Use as instructed - fsbs bid        ??? CALCIUM CITRATE ORAL Take 1 tablet by mouth daily.         ??? diabetic supplies, miscellan. Misc Lancets    Fsbs bid  100 each  3   ??? ergocalciferol (VITAMIN D2) 50,000 unit capsule Take 1 capsule (50,000 Units total) by mouth once a week.  4 capsule  5   ??? escitalopram (LEXAPRO) 10 MG tablet Take 1 tablet (10 mg total) by mouth daily.  30 tablet  5   ??? fluticasone (FLONASE) 50 mcg/actuation nasal spray 2 sprays by Nasal route at bedtime.  16 g  3   ??? gabapentin (NEURONTIN) 600 MG tablet Take 0.5 tablets (300 mg total) by mouth 3 times a day.  75 tablet  3   ??? ipratropium (ATROVENT) 0.03 % nasal spray 2 sprays by Nasal route 2 times a day.  30 mL  5   ??? levothyroxine (SYNTHROID) 88 MCG tablet Take 1 tablet (88 mcg total) by mouth daily.  30 tablet  5   ??? metFORMIN (GLUCOPHAGE) 500 MG tablet Take 1 tablet (500 mg total) by mouth 2 times a day with meals. 2 am, 1 hs  90 tablet  3   ??? montelukast (SINGULAIR) 10 mg tablet Take 1 tablet (10 mg total) by mouth at bedtime.  30 tablet  5   ??? valACYclovir (VALTREX) 500 MG tablet Take 1  tablet (500 mg total) by mouth daily.  60 tablet  3   ??? olmesartan-hydrochlorothiazide (BENICAR HCT) 20-12.5 mg per tablet Take 1 tablet by mouth daily.  30 tablet  5     No current facility-administered medications for this visit.        ROS:   Review of Systems   Constitutional: Negative for fatigue.   All other systems reviewed and are negative.           Objective:   Physical Exam   Constitutional: She appears well-developed and well-nourished.   Cardiovascular: Normal rate and regular rhythm.    Pulmonary/Chest: Effort normal and breath sounds normal.   Musculoskeletal:   Left knee with mild effusion lateral aspect no warmth. In tch ed yesterday  Had some labs surgeon called and said potassium is low so potassium called in   Psychiatric: She has a normal mood and affect.  Filed Vitals:    02/15/12 1533   BP: 130/84   Pulse: 80   Resp: 16   Height: 5' 8 (1.727 m)   Weight: 243 lb (110.224 kg)     Body mass index is 36.96 kg/(m^2).  Body surface area is 2.30 meters squared.                Assessment/Plan:     Patient Active Problem List   Diagnosis   ??? Routine general medical examination at a health care facility   ??? Unspecified vitamin D deficiency   ??? Anxiety state, unspecified   ??? Unspecified essential hypertension   ??? Unspecified asthma   ??? Hyperlipidemia   ??? Hypothyroid   ??? Obesity   ??? Elevated liver enzymes   ??? Diabetes mellitus

## 2012-02-16 DIAGNOSIS — M25569 Pain in unspecified knee: Secondary | ICD-10-CM

## 2012-02-16 NOTE — Unmapped (Signed)
MRI needs PA. [facility] (across the street) has 1 appt left today at 5:30. They will not hold the appt and will not schedule until the PA is completed. Pt said this is urgent. Confirm with her when the process is complete    Please send PA # and pt's cell # [phone]) on the MRI order.  [employer]    Fax [phone]

## 2012-02-16 NOTE — Unmapped (Signed)
Approved # [id number] faxed over and pt notified

## 2012-02-16 NOTE — Unmapped (Signed)
Gave the pt the info and she will go to [facility] to have the imaging done

## 2012-02-16 NOTE — Unmapped (Signed)
Check uric acid if ok mri

## 2012-02-16 NOTE — Unmapped (Signed)
Cont met 500 mg bid wean w wt loss

## 2012-02-16 NOTE — Unmapped (Signed)
Monitor off meds

## 2012-04-12 NOTE — Unmapped (Signed)
Pt needs a fasting lab order for checkup

## 2012-04-21 LAB — COMPREHENSIVE METABOLIC PANEL
A/G Ratio: 1.8 (calc) (ref 1.0–2.5)
ALT: 21 U/L (ref 6–29)
AST: 21 U/L (ref 10–35)
Albumin: 4.5 g/dL (ref 3.6–5.1)
Alkaline Phosphatase: 58 U/L (ref 33–130)
BUN: 24 mg/dL (ref 7–25)
CO2: 25 mmol/L (ref 19–30)
Calcium: 9.7 mg/dL (ref 8.6–10.4)
Chloride: 106 mmol/L (ref 98–110)
Creatinine: 0.6 mg/dL (ref 0.50–1.05)
GFR MDRD Af Amer: 122 mL/min/{1.73_m2} (ref >=60)
Globulin, Total: 2.5 g/dL (calc) (ref 1.9–3.7)
Glucose: 97 mg/dL (ref 65–99)
Potassium: 4 mmol/L (ref 3.5–5.3)
Sodium: 141 mmol/L (ref 135–146)
Total Bilirubin: 0.7 mg/dL (ref 0.2–1.2)
Total Protein: 7 g/dL (ref 6.1–8.1)
eGFR Non-Afr. American: 106 mL/min/{1.73_m2} (ref >=60)

## 2012-04-21 LAB — CBC
Hematocrit: 37.7 % (ref 35.0–45.0)
Hemoglobin: 12.9 g/dL (ref 11.7–15.5)
MCH: 30.5 pg (ref 27.0–33.0)
MCHC: 34.2 g/dL (ref 32.0–36.0)
MCV: 89.3 fL (ref 80.0–100.0)
Platelets: 172 10*3/uL (ref 140–400)
RBC: 4.23 10*6/uL (ref 3.80–5.10)
RDW: 14.7 % (ref 11.0–15.0)
WBC: 4.9 10*3/uL (ref 3.8–10.8)

## 2012-04-21 LAB — IRON: Iron: 99 ug/dL (ref 40–160)

## 2012-04-21 LAB — LIPID PANEL
Chol/HDL Ratio: 3.2 (calc) (ref <=5.0)
Cholesterol, Total: 174 mg/dL (ref 125–200)
HDL: 55 mg/dL (ref >=46)
LDL Calculated: 99 mg/dL (calc) (ref <130)
Non HDL Chol. (LDL+VLDL): 119 mg/dL (calc)
Triglycerides: 98 mg/dL (ref <150)

## 2012-04-21 LAB — VITAMIN D 25 HYDROXY
25-Hydroxy, Vitamin D-2: 55 ng/mL
25-Hydroxy, Vitamin D-3: 19 ng/mL
Vit D, 25-Hydroxy: 74 ng/mL (ref 30–100)

## 2012-04-21 LAB — HEMOGLOBIN A1C: Hemoglobin A1C: 5.7 % of total Hgb (ref <5.7)

## 2012-04-21 LAB — FERRITIN: Ferritin: 121 ng/mL (ref 10–232)

## 2012-04-21 LAB — VITAMIN B12 AND FOLATE
Folic Acid: >24 ng/mL
Vitamin B-12: 515 pg/mL (ref 200–1100)

## 2012-04-21 LAB — TSH: TSH: 0.27 mIU/L

## 2012-04-26 ENCOUNTER — Ambulatory Visit: Admit: 2012-04-26 | Discharge: 2012-04-26 | Payer: PRIVATE HEALTH INSURANCE

## 2012-04-26 DIAGNOSIS — E119 Type 2 diabetes mellitus without complications: Secondary | ICD-10-CM

## 2012-04-26 MED ORDER — levothyroxine (SYNTHROID) 75 MCG tablet
75 | ORAL_TABLET | Freq: Every day | ORAL | Status: AC
Start: 2012-04-26 — End: 2012-08-15

## 2012-04-26 MED ORDER — metFORMIN (GLUCOPHAGE) 500 MG tablet
500 | ORAL_TABLET | ORAL | Status: AC
Start: 2012-04-26 — End: 2012-06-14

## 2012-04-26 MED ORDER — ergocalciferol (VITAMIN D2) 50,000 unit capsule
1250 | ORAL_CAPSULE | ORAL | Status: AC
Start: 2012-04-26 — End: 2012-08-15

## 2012-04-26 MED ORDER — escitalopram (LEXAPRO) 10 MG tablet
10 | ORAL_TABLET | Freq: Every day | ORAL | 1.00 refills | 30.00000 days | Status: AC
Start: 2012-04-26 — End: 2012-05-26

## 2012-04-26 NOTE — Unmapped (Signed)
Wean lexapro

## 2012-04-26 NOTE — Unmapped (Signed)
Subjective  HPI:   Patient ID: Wanda Rowe is a 52 y.o. female.    Chief Complaint:  HPI Comments: S/p gastric sleeve    Diabetes  Pertinent negatives for diabetes include no fatigue.   Hypertension               Medications:  Current Outpatient Prescriptions   Medication Sig Dispense Refill   ??? albuterol (PROVENTIL HFA;VENTOLIN HFA) 90 mcg/actuation inhaler Inhale 1-2 puffs into the lungs every 6 (six) hours as needed. Q4-6H PRN        ??? blood sugar diagnostic (ACCU-CHEK AVIVA PLUS) Strp fsbs qd  100 strip  1   ??? blood-glucose meter (GLUCOSE MONITORING KIT) kit by Other route 2 (two) times daily. Use as instructed - fsbs bid        ??? CALCIUM CITRATE ORAL Take 1 tablet by mouth daily.         ??? diabetic supplies, miscellan. Misc Lancets    Fsbs bid  100 each  3   ??? ergocalciferol (VITAMIN D2) 50,000 unit capsule Take 1 capsule (50,000 Units total) by mouth every 14 days.  6 capsule  3   ??? escitalopram (LEXAPRO) 10 MG tablet Take 1 tablet (10 mg total) by mouth daily.  30 tablet  5   ??? fluticasone (FLONASE) 50 mcg/actuation nasal spray 2 sprays by Nasal route at bedtime.  16 g  3   ??? gabapentin (NEURONTIN) 600 MG tablet Take 0.5 tablets (300 mg total) by mouth 3 times a day.  75 tablet  3   ??? ipratropium (ATROVENT) 0.03 % nasal spray 2 sprays by Nasal route 2 times a day.  30 mL  5   ??? levothyroxine (SYNTHROID) 75 MCG tablet Take 1 tablet (75 mcg total) by mouth daily.  30 tablet  5   ??? metFORMIN (GLUCOPHAGE) 500 MG tablet 1/2 bid  60 tablet  3   ??? valACYclovir (VALTREX) 500 MG tablet Take 1 tablet (500 mg total) by mouth daily.  60 tablet  3   ??? [DISCONTINUED] ergocalciferol (VITAMIN D2) 50,000 unit capsule Take 1 capsule (50,000 Units total) by mouth once a week.  4 capsule  5   ??? [DISCONTINUED] escitalopram (LEXAPRO) 10 MG tablet Take 1 tablet (10 mg total) by mouth daily.  30 tablet  5   ??? [DISCONTINUED] levothyroxine (SYNTHROID) 88 MCG tablet Take 1 tablet (88 mcg total) by mouth daily.  30 tablet  5   ???  [DISCONTINUED] metFORMIN (GLUCOPHAGE) 500 MG tablet 1 bid  1 tablet  0   ??? [DISCONTINUED] montelukast (SINGULAIR) 10 mg tablet Take 1 tablet (10 mg total) by mouth at bedtime.  30 tablet  5   ??? [DISCONTINUED] indomethacin 25 mg/5 mL Susp Take 5 mLs by mouth 3 times a day.  237 mL  0   ??? [DISCONTINUED] lansoprazole (PREVACID) 3 mg/ml Susp oral suspension Take 10 mLs (30 mg total) by mouth daily.  150 mL  0     No current facility-administered medications for this visit.        ROS:   Review of Systems   Constitutional: Negative for fatigue.   All other systems reviewed and are negative.           Objective:   Physical Exam   Constitutional: She appears well-developed and well-nourished.   Cardiovascular: Normal rate and regular rhythm.    Pulmonary/Chest: Effort normal and breath sounds normal.   Psychiatric: She has a normal mood and  affect.             Filed Vitals:    04/26/12 0902   BP: 118/76   Pulse: 72   Resp: 12   Height: 5' 8 (1.727 m)   Weight: 210 lb (95.255 kg)     Body mass index is 31.94 kg/(m^2).  Body surface area is 2.14 meters squared.                Assessment/Plan:     Patient Active Problem List   Diagnosis   ??? Routine general medical examination at a health care facility   ??? Unspecified vitamin D deficiency   ??? Anxiety state, unspecified   ??? Unspecified essential hypertension   ??? Unspecified asthma   ??? Hyperlipidemia   ??? Hypothyroid   ??? Obesity   ??? Elevated liver enzymes   ??? Diabetes mellitus   ??? Knee pain

## 2012-04-26 NOTE — Unmapped (Signed)
Wean off metformin

## 2012-04-26 NOTE — Unmapped (Signed)
resolved 

## 2012-04-26 NOTE — Unmapped (Signed)
Decrease dose and recheck 3 mo

## 2012-04-26 NOTE — Unmapped (Signed)
S/p gastric sleeve doing well

## 2012-04-28 MED ORDER — diabeticsuppliesmiscellanMisc
3.00 refills | 30.00000 days | Status: AC
Start: 2012-04-28 — End: 2013-08-03

## 2012-04-28 MED ORDER — blood sugar diagnostic (ACCU-CHEK AVIVA PLUS TEST STRP) Strp
ORAL_STRIP | 0.00 refills | 30.00000 days | Status: AC
Start: 2012-04-28 — End: 2017-09-14

## 2012-04-28 MED ORDER — fluticasone (FLONASE) 50 mcg/actuation nasal spray
50 | Freq: Every evening | NASAL | Status: AC
Start: 2012-04-28 — End: 2012-09-14

## 2012-04-28 NOTE — Unmapped (Signed)
meds re-sent in

## 2012-04-28 NOTE — Unmapped (Signed)
Patient states not all of her meds were received by pharmacy    Please send again to [facility] on [address]    fluticasone (FLONASE) 50 mcg/actuation nasal spray [[id number]]  And diabetic supplies (test strips and lancets)

## 2012-05-26 ENCOUNTER — Ambulatory Visit: Admit: 2012-05-26 | Discharge: 2012-05-26 | Payer: PRIVATE HEALTH INSURANCE

## 2012-05-26 DIAGNOSIS — R42 Dizziness and giddiness: Secondary | ICD-10-CM

## 2012-05-26 MED ORDER — folic acid-vit B6-vit B12 (FOLBEE) 2.5-25-1 mg Tab
2.5-25-1 | ORAL | 2.00 refills | 30.00000 days | Status: AC
Start: 2012-05-26 — End: 2012-10-07

## 2012-05-26 MED ORDER — scopolamine (TRANSDERM-SCOP) 1.5 mg
1 | MEDICATED_PATCH | TRANSDERMAL | Status: AC
Start: 2012-05-26 — End: 2012-06-14

## 2012-05-26 MED ORDER — albuterol (PROVENTIL HFA;VENTOLIN HFA) 90 mcg/actuation inhaler
90 | Freq: Four times a day (QID) | RESPIRATORY_TRACT | Status: AC | PRN
Start: 2012-05-26 — End: 2013-01-24

## 2012-05-26 NOTE — Unmapped (Signed)
Subjective  HPI:   Patient ID: Wanda Rowe is a 52 y.o. female.    Chief Complaint:  HPI Comments: Dizzy x 6 days. Worse with head movement. Feels foggy in the head. Just tapered off lexapro last dose 1week ago h/o chronic sinus augmentin works but no discolored mucous now. Also more probs with l knee rsd, has increased use of neurontin also f/u asthma back on singulair vent             Medications:  Current Outpatient Prescriptions   Medication Sig Dispense Refill   ??? albuterol (PROVENTIL HFA;VENTOLIN HFA) 90 mcg/actuation inhaler Inhale 1-2 puffs into the lungs every 6 (six) hours as needed. Q4-6H PRN        ??? blood sugar diagnostic (ACCU-CHEK AVIVA PLUS TEST STRP) Strp fsbs qd  100 strip  1   ??? blood-glucose meter (GLUCOSE MONITORING KIT) kit by Other route 2 (two) times daily. Use as instructed - fsbs bid        ??? CALCIUM CITRATE ORAL Take 1 tablet by mouth daily.         ??? diabetic supplies, miscellan. Misc Lancets    Fsbs bid  100 each  3   ??? ergocalciferol (VITAMIN D2) 50,000 unit capsule Take 1 capsule (50,000 Units total) by mouth every 14 days.  6 capsule  3   ??? fluticasone (FLONASE) 50 mcg/actuation nasal spray Use 2 sprays into each nostril at bedtime.  16 g  3   ??? gabapentin (NEURONTIN) 600 MG tablet Take 600 mg by mouth 3 times a day.       ??? ipratropium (ATROVENT) 0.03 % nasal spray 2 sprays by Nasal route 2 times a day.  30 mL  5   ??? levothyroxine (SYNTHROID) 75 MCG tablet Take 1 tablet (75 mcg total) by mouth daily.  30 tablet  5   ??? metFORMIN (GLUCOPHAGE) 500 MG tablet 1/2 bid  60 tablet  3   ??? multivitamin Chew Chew by mouth.       ??? valACYclovir (VALTREX) 500 MG tablet Take 1 tablet (500 mg total) by mouth daily.  60 tablet  3   ??? [DISCONTINUED] gabapentin (NEURONTIN) 600 MG tablet Take 0.5 tablets (300 mg total) by mouth 3 times a day.  75 tablet  3   ??? [DISCONTINUED] escitalopram (LEXAPRO) 10 MG tablet Take 1 tablet (10 mg total) by mouth daily.  30 tablet  5     No current  facility-administered medications for this visit.        ROS:   Review of Systems   Constitutional: Negative for fatigue.   All other systems reviewed and are negative.           Objective:   Physical Exam   Constitutional: She is oriented to person, place, and time. She appears well-developed and well-nourished.   HENT:   Head: Normocephalic and atraumatic.   Right Ear: External ear normal.   Left Ear: External ear normal.   Eyes: Conjunctivae and EOM are normal. Pupils are equal, round, and reactive to light. Left eye exhibits no discharge.   Cardiovascular: Normal rate, regular rhythm, normal heart sounds and intact distal pulses.    Pulmonary/Chest: Effort normal and breath sounds normal.   Abdominal: Soft. Bowel sounds are normal.   Neurological: She is alert and oriented to person, place, and time. Coordination normal.   Psychiatric: She has a normal mood and affect.  Filed Vitals:    05/26/12 0939   BP: 122/84   Pulse: 80   Resp: 16   Height: 5' 8 (1.727 m)   Weight: 204 lb (92.534 kg)     Body mass index is 31.03 kg/(m^2).  Body surface area is 2.11 meters squared.                Assessment/Plan:     Patient Active Problem List   Diagnosis   ??? Routine general medical examination at a health care facility   ??? Unspecified vitamin D deficiency   ??? Anxiety state, unspecified   ??? Unspecified essential hypertension   ??? Unspecified asthma   ??? Hyperlipidemia   ??? Hypothyroid   ??? Obesity   ??? Elevated liver enzymes   ??? Diabetes mellitus   ??? Knee pain

## 2012-05-26 NOTE — Unmapped (Signed)
lexapro w/d, transderm scop for sxs if no better Monday augmentin

## 2012-05-26 NOTE — Unmapped (Signed)
Singulair, albuterol

## 2012-05-26 NOTE — Unmapped (Signed)
Keep use of neurontin to <1800 mg add folbe f/u is no better

## 2012-06-09 DIAGNOSIS — R42 Dizziness and giddiness: Secondary | ICD-10-CM | POA: Insufficient documentation

## 2012-06-09 MED ORDER — valsartan (DIOVAN) 40 MG tablet
40 | ORAL_TABLET | Freq: Every day | ORAL | Status: AC
Start: 2012-06-09 — End: 2012-06-14

## 2012-06-09 NOTE — Unmapped (Signed)
What have readings been

## 2012-06-09 NOTE — Unmapped (Signed)
This am 130/85, yesterday 140/85 in the evening and in the am it was 143/81

## 2012-06-09 NOTE — Unmapped (Signed)
F/u 1 mo

## 2012-06-09 NOTE — Unmapped (Signed)
Pt wanted dr h to know she put herself back on blood pressure medicine= Benicar hct 20/12.5 - blood pressure has been up.

## 2012-06-09 NOTE — Unmapped (Signed)
Advised pt. That dr. Rico Junker would be calling a different medication into the pharmacy

## 2012-06-13 NOTE — Unmapped (Signed)
Pt scheduled

## 2012-06-13 NOTE — Unmapped (Signed)
Can you see pt today for earache and dizziness. It is not better that is the dizziness.

## 2012-06-14 ENCOUNTER — Ambulatory Visit: Admit: 2012-06-14 | Discharge: 2012-06-14 | Payer: PRIVATE HEALTH INSURANCE

## 2012-06-14 DIAGNOSIS — R42 Dizziness and giddiness: Secondary | ICD-10-CM

## 2012-06-14 MED ORDER — escitalopram oxalate (LEXAPRO) 10 MG tablet
10 | ORAL_TABLET | Freq: Every day | ORAL | 1.00 refills | 30.00000 days | Status: AC
Start: 2012-06-14 — End: 2012-08-15

## 2012-06-14 NOTE — Unmapped (Signed)
Subjective  HPI:   Patient ID: Wanda Rowe is a 52 y.o. female.    Chief Complaint:  HPI Comments: Still having episodes of dizziness last a couple hrs tried transderm scop and no help saw ent and he ordered a bunch of tests. bp was high pulse was high last 2 weeks. Has a lot of stress at work and just feels crappy. F/u htn/dm2/neuropathy better since folbe added    Otalgia                Medications:  Current Outpatient Prescriptions   Medication Sig Dispense Refill   ??? albuterol (PROVENTIL HFA;VENTOLIN HFA) 90 mcg/actuation inhaler Inhale 1-2 puffs into the lungs every 6 hours as needed. Q4-6H PRN  1 Inhaler  3   ??? blood sugar diagnostic (ACCU-CHEK AVIVA PLUS TEST STRP) Strp fsbs qd  100 strip  1   ??? blood-glucose meter (GLUCOSE MONITORING KIT) kit by Other route 2 (two) times daily. Use as instructed - fsbs bid        ??? CALCIUM CITRATE ORAL Take 1 tablet by mouth daily.         ??? diabetic supplies, miscellan. Misc Lancets    Fsbs bid  100 each  3   ??? ergocalciferol (VITAMIN D2) 50,000 unit capsule Take 1 capsule (50,000 Units total) by mouth every 14 days.  6 capsule  3   ??? fluticasone (FLONASE) 50 mcg/actuation nasal spray Use 2 sprays into each nostril at bedtime.  16 g  3   ??? folic acid-vit B6-vit B12 (FOLBEE) 2.5-25-1 mg Tab 1 daily  30 each  3   ??? gabapentin (NEURONTIN) 600 MG tablet Take 600 mg by mouth 3 times a day.       ??? ipratropium (ATROVENT) 0.03 % nasal spray 2 sprays by Nasal route 2 times a day.  30 mL  5   ??? IRON,CARBONYL (IRON CHEWS ORAL) Take by mouth.       ??? levothyroxine (SYNTHROID) 75 MCG tablet Take 1 tablet (75 mcg total) by mouth daily.  30 tablet  5   ??? metFORMIN (GLUCOPHAGE) 500 MG tablet 1/2 bid  60 tablet  3   ??? montelukast (SINGULAIR) 10 mg tablet Take 10 mg by mouth at bedtime.       ??? multivitamin Chew Chew by mouth.       ??? valACYclovir (VALTREX) 500 MG tablet Take 1 tablet (500 mg total) by mouth daily.  60 tablet  3   ??? valsartan (DIOVAN) 40 MG tablet Take 1 tablet (40 mg  total) by mouth daily.  30 tablet  0   ??? [DISCONTINUED] scopolamine (TRANSDERM-SCOP) 1.5 mg Place 1 patch onto the skin every 72 hoursy.  4 patch  0     No current facility-administered medications for this visit.        ROS:   Review of Systems   Constitutional: Positive for fatigue.   HENT: Positive for ear pain.    All other systems reviewed and are negative.           Objective:   Physical Exam   Constitutional: She appears well-developed and well-nourished.   HENT:   Head: Normocephalic and atraumatic.   r tm fiil, l tm nl op mild erythema   Cardiovascular: Normal rate, regular rhythm and normal heart sounds.    Pulmonary/Chest: Effort normal and breath sounds normal.   Abdominal: Soft. Bowel sounds are normal.   Psychiatric: She has a normal mood and affect.  Filed Vitals:    06/14/12 0938   BP: 98/76   Pulse: 68   Temp: 98.1 ??F (36.7 ??C)   TempSrc: Oral   Resp: 12   Height: 5' 8 (1.727 m)   Weight: 199 lb (90.266 kg)     Body mass index is 30.26 kg/(m^2).  Body surface area is 2.08 meters squared.                Assessment/Plan:     Patient Active Problem List   Diagnosis   ??? Routine general medical examination at a health care facility   ??? Unspecified vitamin D deficiency   ??? Anxiety state, unspecified   ??? Unspecified essential hypertension   ??? Unspecified asthma   ??? Hyperlipidemia   ??? Hypothyroid   ??? Obesity   ??? Elevated liver enzymes   ??? Diabetes mellitus   ??? Dizziness   ??? Neuropathy

## 2012-06-14 NOTE — Unmapped (Signed)
Cont folbe plus neurontin

## 2012-06-14 NOTE — Unmapped (Signed)
Anxiety vs dehydration electrolyte fluid restart lexapro f/u 1 mo, hold on vestibular testing until try this

## 2012-06-14 NOTE — Unmapped (Signed)
Bring in bp cuff cont to monitor

## 2012-06-14 NOTE — Unmapped (Signed)
Stop glucophage

## 2012-07-20 ENCOUNTER — Ambulatory Visit: Admit: 2012-07-20 | Discharge: 2012-07-20 | Payer: PRIVATE HEALTH INSURANCE

## 2012-07-20 DIAGNOSIS — J069 Acute upper respiratory infection, unspecified: Secondary | ICD-10-CM

## 2012-07-20 MED ORDER — methylPREDNISolone (MEDROL, PAK,) 4 mg tablet
4 | PACK | ORAL | Status: AC
Start: 2012-07-20 — End: 2012-08-15

## 2012-07-20 MED ORDER — promethazine-codeine (PHENERGAN WITH CODEINE) 6.25-10 mg/5 mL syrup
6.25-10 | ORAL | 0.00 refills | 8.00000 days | Status: AC | PRN
Start: 2012-07-20 — End: 2012-08-15

## 2012-07-20 NOTE — Unmapped (Signed)
Subjective  HPI:   Patient ID: Wanda Rowe is a 52 y.o. female.    Chief Complaint:  HPI Comments: Hee for f/u from urgent care for earache and sinus and dry cough; no fever and non smoker    Sinusitis  Pertinent negatives include no coughing or shortness of breath.              Medications:  Current Outpatient Prescriptions   Medication Sig Dispense Refill   ??? albuterol (PROVENTIL HFA;VENTOLIN HFA) 90 mcg/actuation inhaler Inhale 1-2 puffs into the lungs every 6 hours as needed. Q4-6H PRN  1 Inhaler  3   ??? blood sugar diagnostic (ACCU-CHEK AVIVA PLUS TEST STRP) Strp fsbs qd  100 strip  1   ??? blood-glucose meter (GLUCOSE MONITORING KIT) kit by Other route 2 (two) times daily. Use as instructed - fsbs bid        ??? CALCIUM CITRATE ORAL Take 1 tablet by mouth daily.         ??? diabetic supplies, miscellan. Misc Lancets    Fsbs bid  100 each  3   ??? ergocalciferol (VITAMIN D2) 50,000 unit capsule Take 1 capsule (50,000 Units total) by mouth every 14 days.  6 capsule  3   ??? fluticasone (FLONASE) 50 mcg/actuation nasal spray Use 2 sprays into each nostril at bedtime.  16 g  3   ??? folic acid-vit B6-vit B12 (FOLBEE) 2.5-25-1 mg Tab 1 daily  30 each  3   ??? gabapentin (NEURONTIN) 600 MG tablet Take 600 mg by mouth 3 times a day.       ??? ipratropium (ATROVENT) 0.03 % nasal spray 2 sprays by Nasal route 2 times a day.  30 mL  5   ??? IRON,CARBONYL (IRON CHEWS ORAL) Take by mouth.       ??? levothyroxine (SYNTHROID) 75 MCG tablet Take 1 tablet (75 mcg total) by mouth daily.  30 tablet  5   ??? montelukast (SINGULAIR) 10 mg tablet Take 10 mg by mouth at bedtime.       ??? multivitamin Chew Chew by mouth.       ??? valACYclovir (VALTREX) 500 MG tablet Take 1 tablet (500 mg total) by mouth daily.  60 tablet  3   ??? escitalopram oxalate (LEXAPRO) 10 MG tablet Take 1 tablet (10 mg total) by mouth daily.  30 tablet  3     No current facility-administered medications for this visit.        ROS:   Review of Systems   Constitutional: Negative for  fever, fatigue and unexpected weight change.   HENT: Negative for ear discharge.    Respiratory: Negative for cough, choking, shortness of breath, wheezing and stridor.    Cardiovascular: Negative for chest pain, palpitations and leg swelling.   Musculoskeletal: Negative for back pain, joint swelling, arthralgias and gait problem.   Hematological: Negative for adenopathy. Does not bruise/bleed easily.          Objective:   Physical Exam   Constitutional: She is oriented to person, place, and time. She appears well-developed and well-nourished.   Cardiovascular: Normal rate, normal heart sounds and intact distal pulses.  Exam reveals no gallop and no friction rub.    No murmur heard.  Pulmonary/Chest: Breath sounds normal.   Musculoskeletal: Normal range of motion. She exhibits no edema and no tenderness.   Neurological: She is alert and oriented to person, place, and time. She has normal reflexes.   Skin: Skin is  warm and dry.             Filed Vitals:    07/20/12 1243   BP: 124/72   Pulse: 80   Temp: 98 ??F (36.7 ??C)   TempSrc: Oral   Resp: 12   Height: 5' 8 (1.727 m)   Weight: 192 lb (87.091 kg)     Body mass index is 29.2 kg/(m^2).  Body surface area is 2.04 meters squared.    Suggest medrol and allegra and cough rx            Assessment/Plan:     Patient Active Problem List   Diagnosis   ??? Routine general medical examination at a health care facility   ??? Unspecified vitamin D deficiency   ??? Anxiety state, unspecified   ??? Unspecified essential hypertension   ??? Unspecified asthma   ??? Hyperlipidemia   ??? Hypothyroid   ??? Obesity   ??? Elevated liver enzymes   ??? Diabetes mellitus   ??? Dizziness   ??? Neuropathy

## 2012-07-22 MED ORDER — levofloxacin (LEVAQUIN) 500 MG tablet
500 | ORAL_TABLET | Freq: Every day | ORAL | Status: AC
Start: 2012-07-22 — End: 2012-08-01

## 2012-07-22 NOTE — Unmapped (Signed)
Only had rx for 2d; i did send levaquin in but would start end of weekend if not better; things often take a little longer

## 2012-07-22 NOTE — Unmapped (Signed)
Patient is not better. What do you recommend? Patient is still having ear pain, chest discomfort, cough, drainage. Patient is leaving Sunday for vacation

## 2012-07-22 NOTE — Unmapped (Signed)
Spoke with patient and gave message

## 2012-08-10 LAB — HEMOGLOBIN A1C: Hemoglobin A1C: 5.5 % of total Hgb (ref <5.7)

## 2012-08-10 LAB — COMPREHENSIVE METABOLIC PANEL
A/G Ratio: 1.8 (calc) (ref 1.0–2.5)
ALT: 17 U/L (ref 6–29)
AST: 23 U/L (ref 10–35)
Albumin: 4.3 g/dL (ref 3.6–5.1)
Alkaline Phosphatase: 54 U/L (ref 33–130)
BUN: 22 mg/dL (ref 7–25)
CO2: 28 mmol/L (ref 19–30)
Calcium: 9.7 mg/dL (ref 8.6–10.4)
Chloride: 106 mmol/L (ref 98–110)
Creatinine: 0.68 mg/dL (ref 0.50–1.05)
GFR MDRD Af Amer: 117 mL/min/{1.73_m2} (ref >=60)
Globulin, Total: 2.4 g/dL (calc) (ref 1.9–3.7)
Glucose: 98 mg/dL (ref 65–99)
Potassium: 4.3 mmol/L (ref 3.5–5.3)
Sodium: 142 mmol/L (ref 135–146)
Total Bilirubin: 0.7 mg/dL (ref 0.2–1.2)
Total Protein: 6.7 g/dL (ref 6.1–8.1)
eGFR Non-Afr. American: 101 mL/min/{1.73_m2} (ref >=60)

## 2012-08-10 LAB — VITAMIN D 25 HYDROXY
25-Hydroxy, Vitamin D-2: 36 ng/mL
25-Hydroxy, Vitamin D-3: 28 ng/mL
Vit D, 25-Hydroxy: 64 ng/mL (ref 30–100)

## 2012-08-10 LAB — VITAMIN B12 AND FOLATE
Folic Acid: >24 ng/mL
Vitamin B-12: 1413 pg/mL (ref 200–1100)

## 2012-08-10 LAB — TSH: TSH: 1.09 mIU/L

## 2012-08-11 MED ORDER — ipratropium (ATROVENT) 0.03 % nasal spray
21 | Freq: Two times a day (BID) | NASAL | 1.00 refills | 30.00000 days | Status: AC
Start: 2012-08-11 — End: 2012-09-19

## 2012-08-15 ENCOUNTER — Ambulatory Visit: Admit: 2012-08-15 | Discharge: 2012-08-15 | Payer: PRIVATE HEALTH INSURANCE

## 2012-08-15 DIAGNOSIS — F32A Depression, unspecified: Secondary | ICD-10-CM

## 2012-08-15 MED ORDER — gabapentin (NEURONTIN) 600 MG tablet
600 | ORAL_TABLET | Freq: Three times a day (TID) | ORAL | Status: AC
Start: 2012-08-15 — End: 2013-01-24

## 2012-08-15 MED ORDER — ergocalciferol (VITAMIN D2) 50,000 unit capsule
1250 | ORAL_CAPSULE | ORAL | Status: AC
Start: 2012-08-15 — End: 2013-01-24

## 2012-08-15 MED ORDER — levothyroxine (SYNTHROID, LEVOTHROID) 75 MCG tablet
75 | ORAL_TABLET | Freq: Every day | ORAL | Status: AC
Start: 2012-08-15 — End: 2013-01-24

## 2012-08-15 MED ORDER — DULoxetine (CYMBALTA) 60 MG capsule
60 | ORAL_CAPSULE | Freq: Every day | ORAL | Status: AC
Start: 2012-08-15 — End: 2013-01-06

## 2012-08-15 MED ORDER — DULoxetine (CYMBALTA) 30 MG capsule
30 | ORAL_CAPSULE | Freq: Every day | ORAL | Status: AC
Start: 2012-08-15 — End: 2013-01-24

## 2012-08-15 NOTE — Unmapped (Signed)
Cont neurontin add cymbalta

## 2012-08-15 NOTE — Unmapped (Signed)
Trial cymbalta

## 2012-08-15 NOTE — Unmapped (Signed)
resolved 

## 2012-08-15 NOTE — Unmapped (Signed)
Monitor off meds

## 2012-08-15 NOTE — Unmapped (Signed)
Instructed pt to continue current meds

## 2012-08-15 NOTE — Unmapped (Signed)
Subjective  HPI:   Patient ID: Wanda Rowe is a 52 y.o. female.    Chief Complaint:  HPI Comments: F/u htn, dm2, lipids not taking any meds cont to loose slower. hasnt been on lexapro lots of stress also rsd right knee worsening has been taking 3 neurontin daily    Hypertension               Medications:  Current Outpatient Prescriptions   Medication Sig Dispense Refill   ??? albuterol (PROVENTIL HFA;VENTOLIN HFA) 90 mcg/actuation inhaler Inhale 1-2 puffs into the lungs every 6 hours as needed. Q4-6H PRN  1 Inhaler  3   ??? blood sugar diagnostic (ACCU-CHEK AVIVA PLUS TEST STRP) Strp fsbs qd  100 strip  1   ??? blood-glucose meter (GLUCOSE MONITORING KIT) kit by Other route 2 (two) times daily. Use as instructed - fsbs bid        ??? CALCIUM CITRATE ORAL Take 1 tablet by mouth daily.         ??? diabetic supplies, miscellan. Misc Lancets    Fsbs bid  100 each  3   ??? ergocalciferol (VITAMIN D2) 50,000 unit capsule Take 1 capsule (50,000 Units total) by mouth every 14 days.  6 capsule  3   ??? fluticasone (FLONASE) 50 mcg/actuation nasal spray Use 2 sprays into each nostril at bedtime.  16 g  3   ??? folic acid-vit B6-vit B12 (FOLBEE) 2.5-25-1 mg Tab 1 daily  30 each  3   ??? gabapentin (NEURONTIN) 600 MG tablet Take 1 tablet (600 mg total) by mouth 3 times a day.  90 tablet  3   ??? ipratropium (ATROVENT) 0.03 % nasal spray Use 2 sprays into each nostril 2 times a day.  30 mL  0   ??? IRON,CARBONYL (IRON CHEWS ORAL) Take by mouth.       ??? levothyroxine (SYNTHROID, LEVOTHROID) 75 MCG tablet Take 1 tablet (75 mcg total) by mouth daily.  30 tablet  5   ??? montelukast (SINGULAIR) 10 mg tablet Take 10 mg by mouth at bedtime.       ??? multivitamin Chew Chew by mouth.       ??? valACYclovir (VALTREX) 500 MG tablet Take 1 tablet (500 mg total) by mouth daily.  60 tablet  3   ??? DULoxetine (CYMBALTA) 30 MG capsule Take 1 capsule (30 mg total) by mouth daily.  7 capsule  0   ??? DULoxetine (CYMBALTA) 60 MG capsule Take 1 capsule (60 mg total) by mouth  daily.  30 capsule  3   ??? IRON,CARBONYL (IRON CHEWS ORAL) Take by mouth.         No current facility-administered medications for this visit.        ROS:   Review of Systems   Constitutional: Negative for fatigue.   All other systems reviewed and are negative.           Objective:   Physical Exam   Constitutional: She appears well-developed and well-nourished.   HENT:   Head: Normocephalic and atraumatic.   Cardiovascular: Normal rate, regular rhythm and normal heart sounds.    Pulmonary/Chest: Effort normal and breath sounds normal.   Abdominal: Soft. Bowel sounds are normal.   Psychiatric: She has a normal mood and affect.             Filed Vitals:    08/15/12 0819 08/15/12 0825   BP: 116/78 124/78   Pulse: 72    Resp: 12  Height: 5' 8 (1.727 m)    Weight: 187 lb (84.823 kg)      Body mass index is 28.44 kg/(m^2).  Body surface area is 2.02 meters squared.                Assessment/Plan:     Patient Active Problem List   Diagnosis   ??? Routine general medical examination at a health care facility   ??? Unspecified vitamin D deficiency   ??? Anxiety state, unspecified   ??? Unspecified essential hypertension   ??? Unspecified asthma   ??? Hyperlipidemia   ??? Hypothyroid   ??? Obesity   ??? Elevated liver enzymes   ??? Diabetes mellitus   ??? Dizziness   ??? Neuropathy

## 2012-08-23 MED ORDER — montelukast (SINGULAIR) 10 mg tablet
10 | ORAL_TABLET | Freq: Every evening | ORAL | Status: AC
Start: 2012-08-23 — End: 2012-10-25

## 2012-09-14 MED ORDER — fluticasone (FLONASE) 50 mcg/actuation nasal spray
50 | Freq: Every evening | NASAL | Status: AC
Start: 2012-09-14 — End: 2013-01-24

## 2012-09-19 MED ORDER — ipratropium (ATROVENT) 0.03 % nasal spray
21 | Freq: Two times a day (BID) | NASAL | 1.00 refills | 30.00000 days | Status: AC
Start: 2012-09-19 — End: 2012-12-08

## 2012-10-07 MED ORDER — FOLBEE 2.5-25-1 mg Tab
2.5-25-1 | ORAL | Status: AC
Start: 2012-10-07 — End: 2012-12-08

## 2012-10-26 MED ORDER — montelukast (SINGULAIR) 10 mg tablet
10 | ORAL_TABLET | ORAL | Status: AC
Start: 2012-10-26 — End: 2012-11-25

## 2012-11-10 DIAGNOSIS — N951 Menopausal and female climacteric states: Secondary | ICD-10-CM | POA: Insufficient documentation

## 2012-11-10 DIAGNOSIS — N949 Unspecified condition associated with female genital organs and menstrual cycle: Secondary | ICD-10-CM | POA: Insufficient documentation

## 2012-11-25 MED ORDER — montelukast (SINGULAIR) 10 mg tablet
10 | ORAL_TABLET | ORAL | Status: AC
Start: 2012-11-25 — End: 2013-01-06

## 2012-12-01 MED ORDER — fluticasone (FLONASE) 50 mcg/actuation nasal spray
50 | NASAL | Status: AC
Start: 2012-12-01 — End: 2013-01-24

## 2012-12-09 MED ORDER — ipratropium (ATROVENT) 0.03 % nasal spray
21 | NASAL | 1.00 refills | 30.00000 days | Status: AC
Start: 2012-12-09 — End: 2013-01-06

## 2012-12-09 MED ORDER — FOLBEE 2.5-25-1 mg Tab
2.5-25-1 | ORAL | Status: AC
Start: 2012-12-09 — End: 2013-01-24

## 2013-01-06 MED ORDER — DULoxetine (CYMBALTA) 60 MG capsule
60 | ORAL_CAPSULE | Freq: Every day | ORAL | Status: AC
Start: 2013-01-06 — End: 2013-01-24

## 2013-01-06 MED ORDER — montelukast (SINGULAIR) 10 mg tablet
10 | ORAL_TABLET | Freq: Every evening | ORAL | Status: AC
Start: 2013-01-06 — End: 2013-01-24

## 2013-01-06 MED ORDER — ipratropium (ATROVENT) 0.03 % nasal spray
21 | Freq: Two times a day (BID) | NASAL | 1.00 refills | 30.00000 days | Status: AC
Start: 2013-01-06 — End: 2013-01-24

## 2013-01-06 NOTE — Unmapped (Signed)
Sent medication in, please add labs

## 2013-01-06 NOTE — Unmapped (Signed)
Pt is scheduled for Dec 23  Please add lab order for routine check. Pt has breast cancer and just finished chemo. If you have questions, please give pt a call before ordering labs.    Refill 30 day  singulair, Cymbalta Atrovent nasal spray  [facility], [address]

## 2013-01-16 DIAGNOSIS — J309 Allergic rhinitis, unspecified: Secondary | ICD-10-CM | POA: Insufficient documentation

## 2013-01-19 LAB — LDL-CHOLESTEROL: LDL Calculated: 95 mg/dL (ref <130)

## 2013-01-19 LAB — COMPREHENSIVE METABOLIC PANEL
A/G Ratio: 1.8 (calc) (ref 1.0–2.5)
ALT: 15 U/L (ref 6–29)
AST: 21 U/L (ref 10–35)
Albumin: 4.1 g/dL (ref 3.6–5.1)
Alkaline Phosphatase: 70 U/L (ref 33–130)
BUN: 12 mg/dL (ref 7–25)
CO2: 26 mmol/L (ref 19–30)
Calcium: 9.3 mg/dL (ref 8.6–10.4)
Chloride: 109 mmol/L (ref 98–110)
Creatinine: 0.59 mg/dL (ref 0.50–1.05)
GFR MDRD Af Amer: 122 mL/min/1.73m2 (ref >=60)
Globulin, Total: 2.3 g/dL (ref 1.9–3.7)
Glucose: 89 mg/dL (ref 65–99)
Potassium: 4.2 mmol/L (ref 3.5–5.3)
Sodium: 141 mmol/L (ref 135–146)
Total Bilirubin: 0.4 mg/dL (ref 0.2–1.2)
Total Protein: 6.4 g/dL (ref 6.1–8.1)
eGFR Non-Afr. American: 105 mL/min/1.73m2 (ref >=60)

## 2013-01-19 LAB — VITAMIN D 25 HYDROXY
25-Hydroxy, Vitamin D-2: 30 ng/mL
25-Hydroxy, Vitamin D-3: 17 ng/mL
Vit D, 25-Hydroxy: 47 ng/mL (ref 30–100)

## 2013-01-19 LAB — NON-HDL CHOLESTEROL: Non HDL Chol. (LDL+VLDL): 110 mg/dL (calc)

## 2013-01-19 LAB — VITAMIN B12 AND FOLATE
Folic Acid: 18.9 ng/mL
Vitamin B-12: 1774 pg/mL — ABNORMAL HIGH (ref 200–1100)

## 2013-01-19 LAB — CBC
Hematocrit: 27.7 % (ref 35.0–45.0)
Hemoglobin: 9.1 g/dL (ref 11.7–15.5)
MCH: 31.8 pg (ref 27.0–33.0)
MCHC: 32.9 g/dL (ref 32.0–36.0)
MCV: 96.6 fL (ref 80.0–100.0)
Platelets: 170 10*3/uL (ref 140–400)
RBC: 2.87 10*6/uL (ref 3.80–5.10)
RDW: 18.5 % (ref 11.0–15.0)
WBC: 5.1 10*3/uL (ref 3.8–10.8)

## 2013-01-19 LAB — TRIGLYCERIDES: Triglycerides: 74 mg/dL (ref <150)

## 2013-01-19 LAB — T3, FREE: T3, Free: 3.1 pg/mL (ref 2.3–4.2)

## 2013-01-19 LAB — HDL CHOLESTEROL: HDL: 52 mg/dL (ref >=46)

## 2013-01-19 LAB — T4, FREE: Free T4: 0.9 ng/dL (ref 0.8–1.8)

## 2013-01-19 LAB — FERRITIN: Ferritin: 231 ng/mL (ref 10–232)

## 2013-01-19 LAB — CHOL/HDLC RATIO: Chol/HDL Ratio: 3.1 (calc) (ref <=5.0)

## 2013-01-19 LAB — TSH: TSH: 0.93 mIU/L

## 2013-01-19 LAB — CHOLESTEROL, TOTAL: Cholesterol, Total: 162 mg/dL (ref 125–200)

## 2013-01-19 LAB — HEMOGLOBIN A1C: Hemoglobin A1C: 5.3 % of total Hgb (ref <5.7)

## 2013-01-19 LAB — IRON: Iron: 65 ug/dL (ref 45–160)

## 2013-01-24 ENCOUNTER — Ambulatory Visit: Admit: 2013-01-24 | Discharge: 2013-01-24 | Payer: PRIVATE HEALTH INSURANCE

## 2013-01-24 DIAGNOSIS — E785 Hyperlipidemia, unspecified: Secondary | ICD-10-CM

## 2013-01-24 MED ORDER — albuterol (PROVENTIL HFA;VENTOLIN HFA) 90 mcg/actuation inhaler
90 | Freq: Four times a day (QID) | RESPIRATORY_TRACT | Status: AC | PRN
Start: 2013-01-24 — End: 2013-11-16

## 2013-01-24 MED ORDER — DULoxetine (CYMBALTA) 60 MG capsule
60 | ORAL_CAPSULE | Freq: Every day | ORAL | Status: AC
Start: 2013-01-24 — End: 2013-08-03

## 2013-01-24 MED ORDER — folic acid-vit B6-vit B12 (FOLBEE) 2.5-25-1 mg Tab
2.5-25-1 | Freq: Every day | ORAL | 2.00 refills | 30.00000 days | Status: AC
Start: 2013-01-24 — End: 2013-08-03

## 2013-01-24 MED ORDER — gabapentin (NEURONTIN) 600 MG tablet
600 | ORAL_TABLET | Freq: Three times a day (TID) | ORAL | Status: AC
Start: 2013-01-24 — End: 2013-08-03

## 2013-01-24 MED ORDER — montelukast (SINGULAIR) 10 mg tablet
10 | ORAL_TABLET | Freq: Every evening | ORAL | Status: AC
Start: 2013-01-24 — End: 2013-08-03

## 2013-01-24 MED ORDER — levothyroxine (SYNTHROID, LEVOTHROID) 75 MCG tablet
75 | ORAL_TABLET | Freq: Every day | ORAL | Status: AC
Start: 2013-01-24 — End: 2013-08-03

## 2013-01-24 MED ORDER — ipratropium (ATROVENT) 0.03 % nasal spray
21 | Freq: Two times a day (BID) | NASAL | 1.00 refills | 30.00000 days | Status: AC
Start: 2013-01-24 — End: 2013-11-16

## 2013-01-24 MED ORDER — ergocalciferol (VITAMIN D2) 50,000 unit capsule
1250 | ORAL_CAPSULE | ORAL | Status: AC
Start: 2013-01-24 — End: 2013-08-03

## 2013-01-24 MED ORDER — fluticasone (FLONASE) 50 mcg/actuation nasal spray
50 | Freq: Every day | NASAL | Status: AC
Start: 2013-01-24 — End: 2017-09-14

## 2013-01-24 NOTE — Unmapped (Signed)
Instructed pt to continue current meds

## 2013-01-24 NOTE — Unmapped (Signed)
rx d

## 2013-01-24 NOTE — Unmapped (Signed)
Subjective  HPI:   Patient ID: Wanda Rowe is a 52 y.o. female.    Chief Complaint:  HPI Comments: F/u had chemo on perjeta now for breast ca her 2 positive. Needs f/u thyroid, asthma, anxiety rsd             Medications:  Current Outpatient Prescriptions   Medication Sig Dispense Refill   ??? albuterol (PROVENTIL HFA;VENTOLIN HFA) 90 mcg/actuation inhaler Inhale 1-2 puffs into the lungs every 6 hours as needed. Q4-6H PRN  1 Inhaler  3   ??? blood sugar diagnostic (ACCU-CHEK AVIVA PLUS TEST STRP) Strp fsbs qd  100 strip  1   ??? blood-glucose meter (GLUCOSE MONITORING KIT) kit by Other route 2 (two) times daily. Use as instructed - fsbs bid        ??? CALCIUM CITRATE ORAL Take 1 tablet by mouth daily.         ??? diabetic supplies, miscellan. Misc Lancets    Fsbs bid  100 each  3   ??? DULoxetine (CYMBALTA) 30 MG capsule Take 1 capsule (30 mg total) by mouth daily.  7 capsule  0   ??? DULoxetine (CYMBALTA) 60 MG capsule Take 1 capsule (60 mg total) by mouth daily.  30 capsule  0   ??? ergocalciferol (VITAMIN D2) 50,000 unit capsule Take 1 capsule (50,000 Units total) by mouth every 14 days.  6 capsule  3   ??? fluticasone (FLONASE) 50 mcg/actuation nasal spray Use 2 sprays into each nostril at bedtime.  16 g  2   ??? fluticasone (FLONASE) 50 mcg/actuation nasal spray USE 2 SPRAYS IN EACH NOSTRIL AT BEDTIME  16 g  0   ??? FOLBEE 2.5-25-1 mg Tab TAKE 1 TABLET BY MOUTH EVERY DAY  30 each  0   ??? gabapentin (NEURONTIN) 600 MG tablet Take 1 tablet (600 mg total) by mouth 3 times a day.  90 tablet  3   ??? ipratropium (ATROVENT) 0.03 % nasal spray Use 2 sprays into each nostril 2 times a day.  30 mL  0   ??? IRON,CARBONYL (IRON CHEWS ORAL) Take by mouth.       ??? IRON,CARBONYL (IRON CHEWS ORAL) Take by mouth.       ??? levothyroxine (SYNTHROID, LEVOTHROID) 75 MCG tablet Take 1 tablet (75 mcg total) by mouth daily.  30 tablet  5   ??? montelukast (SINGULAIR) 10 mg tablet Take 1 tablet (10 mg total) by mouth at bedtime.  30 tablet  0   ??? multivitamin  Chew Chew by mouth.       ??? valACYclovir (VALTREX) 500 MG tablet Take 1 tablet (500 mg total) by mouth daily.  60 tablet  3     No current facility-administered medications for this visit.        ROS:   Review of Systems   Constitutional: Negative for fatigue.   All other systems reviewed and are negative.           Objective:   Physical Exam   Constitutional: She appears well-developed and well-nourished.   Cardiovascular: Normal rate, regular rhythm and normal heart sounds.    Pulmonary/Chest: Effort normal and breath sounds normal.   Abdominal: Soft. Bowel sounds are normal.   Psychiatric: She has a normal mood and affect. Her behavior is normal.             Filed Vitals:    01/24/13 1152   BP: 122/72   Pulse: 70   Temp: 98 ??  F (36.7 ??C)   TempSrc: Oral   Resp: 12   Height: 5' 8 (1.727 m)   Weight: 184 lb (83.462 kg)     Body mass index is 27.98 kg/(m^2).  Body surface area is 2.00 meters squared.                Assessment/Plan:     Patient Active Problem List   Diagnosis   ??? Routine general medical examination at a health care facility   ??? Unspecified vitamin D deficiency   ??? Anxiety state, unspecified   ??? Unspecified essential hypertension   ??? Unspecified asthma   ??? Hypothyroid   ??? Obesity   ??? RSD lower limb

## 2013-01-24 NOTE — Unmapped (Signed)
cymbalta 

## 2013-02-02 HISTORY — PX: BREAST SURGERY: SHX581

## 2013-02-09 NOTE — Unmapped (Signed)
THE [facility]    PATIENT NAME: Wanda Rowe, Wanda Rowe                              MR#: [id number]  DATE OF BIRTH: July 08, 1960                           Account #: [id number]  ADMITTING: Ammie Ferrier B                         ROOM #: Noa.Canon  ATTENDING: Ammie Ferrier B                   NURSING UNIT: C5S  PRIMARY: Kizer Nobbe K                          ADMIT DATE: 02/09/2013  REFERRING: Ammie Ferrier B                 DISCHARGE DATE:  DICTATED BY: Burna Forts                               OPERATIVE REPORT    DATE OF OPERATION: 02/09/13    PREOPERATIVE DIAGNOSIS:   History of right breast cancer.    POSTOPERATIVE DIAGNOSES:  History of right breast cancer.    PROCEDURE:  Immediate reconstruction of the right breast with adjacent tissue  rearrangement totaling an area of 25 cm2.    SURGEON:  Dr. Burna Forts.    INDICATIONS AND CONSENT:  The patient is a 53 year old female with history of  right breast cancer.  She has undergone neoadjuvant chemotherapy and is now  going to have a lumpectomy in the right breast in the upper outer quadrant of  the breast.  She has been seen and evaluated by Dr. Ammie Ferrier from the  breast surgery service and has been referred to [place] for evaluation for  reconstructive options following this procedure.  After discussing the patient's  options with her, she elected to proceed with adjacent tissue rearrangement to  optimize the shape and size of the breast following the lumpectomy.  The  procedure was explained in detail and the potential complications including  possible nipple and areolar sensitivity changes and also possible partial or  total nipple loss were explained.  We also discussed the contralateral breast  and the patient wished to have a reduction for symmetry on this side.  The same  complications were outlined and discussed at length.  Proper consent was  obtained.    PROCEDURE IN DETAIL:  The patient was seen in the preoperative  holding area and  both breasts were marked for a Wise skin excision with an inferiorly-based  pedicle to carry the nipple and areolar complex.  She was taken to the operating  room and underwent smooth induction of anesthesia.  She underwent a right-sided  lumpectomy that was needle localized by Dr. Ammie Ferrier.  Please refer to  her dictation for details.  She also underwent a sentinel lymph node biopsy by  Dr. Vanice Sarah at this time, the details of which was also contained within Dr.  Vanice Sarah' report.  Once the procedures were completed, Dr. Abundio Miu and myself came  to the room to proceed with the breast reconstructive procedure on the right  andreduction  on the left.  Dr. Abundio Miu proceeded with the reduction on the left  while I carried out the reconstruction on the right.  We worked simultaneously.    On the right side, the patient had a lumpectomy down through the lateral portion  of the Wise pattern that the adjacent tissue rearrangement would be performed  through.  So, at this time, the remainder of the incisions were completed with a  scalpel and the inferior of the based pedicle that carried the nipple and  areolar complex were all deepithelialized.  I then proceeded to elevate skin  flaps in the superior direction down to the chest wall.  A portion of the breast  tissue centrally, superior to the nipple areolar complex, was resected.  A total  of 260 grams of breast tissue including the lumpectomy specimen was resected  from the right breast.  At this time, the nipple and areolar complex and the  inferior based pedicle were advanced in a superior direction and then  superiorlybased skin flaps were all advanced over this pedicle.  In this  fashion, the incisions were then approximated and after irrigating the wound  with copious amounts of saline confirming hemostasis and placing a drain, the  incisions were closed in several layers using absorbable suture.    At the completion of the inset, the nipple  and areolar complex appeared to be  pink with good capillary refill.  The drain was placed to self suction.    On the left side Dr. Abundio Miu carried out the breast reduction and resected a  total of 250 grams of tissue.  Please refer to his dictation for specific  details pertaining to that operation.    I was present for and participated in the entire operation dictated above.  No  complications.      Dict: Burna Forts, MD  AuthBurna Forts, MD  D: 02/09/2013 16:08:07  T: 02/10/2013 08:45:49  Orig. Job# [id number]  Dictation ID: [id number]

## 2013-02-09 NOTE — Unmapped (Signed)
THE [facility]    PATIENT NAME: Wanda Rowe, Wanda Rowe                              MR#: [id number]  DATE OF BIRTH: 02/01/1961                           Account #: [id number]  ADMITTING: Ammie Ferrier B                         ROOM #: Noa.Canon  ATTENDING: Ammie Ferrier B                   NURSING UNIT: C5S  PRIMARY: Thai Hemrick K                          ADMIT DATE: 02/09/2013  REFERRING: Ammie Ferrier B                 DISCHARGE DATE:  DICTATED BY: Annye Asa                               OPERATIVE REPORT    DATE OF OPERATION: 02/09/13    PREOPERATIVE DIAGNOSIS:  Right-sided breast carcinoma.    POSTOPERATIVE DIAGNOSIS:  Right-sided breast carcinoma.    PROCEDURES:  Left reduction mammoplasty for symmetry purposes.    SURGEON:  Annye Asa, MD    SUMMARY OF ENCOUNTER:  This is a patient with a diagnosis of a right-sided  breast carcinoma.  Today she has undergone a right-sided lumpectomy with  oncoplastic symmetry procedure by Dr. Burna Forts.  Please see his dictation  separately.  Preoperatively, we discussed the possible risks of reduction  mammoplasty which included infection, scar, pain, bleeding, need for further  surgery, hematoma, seroma, nipple devitalization and poor symmetric outcome.  The patient acknowledged these risks and was taken to the operating room where  general endotracheal anesthesia was induced and sterile prep done of the chest  and abdominal wall region.  A Wise pattern biopsy pattern were designed at the  skin with a 7 cm vertical limb to the level of the nipple.  The incisions were  made through the skin and subcutaneous tissue.  A wide pedicle was left intact  and the primary area of the resection was laterally and superiorly.  A total of  250 grams were removed and submitted for permanent section.  Meticulous  hemostasis was then achieved and the area was then closed in layered fashion  over a hubless Blake drain.  The patient's wounds were  dressed sterilely.  She  was extubated without issue and transferred to the office postoperative care  unit.      Dict: Annye Asa, MD  AuthAnnye Asa, MD  D: 02/09/2013 15:01:46  T: 02/10/2013 07:49:42  Orig. Job# [id number]  Dictation ID: [id number]

## 2013-02-12 NOTE — Unmapped (Signed)
THE [facility]    PATIENT NAME: Wanda Rowe, Wanda Rowe                              MR#: [id number]  DATE OF BIRTH: September 21, 1960                           Account #: [id number]  ADMITTING: Ammie Ferrier B                         ROOM #: [facility]on  ATTENDING: Ammie Ferrier B                   NURSING UNIT: C5S  PRIMARY: Alexzandria Massman K                          ADMIT DATE: 02/09/2013  REFERRING: Ammie Ferrier B                 DISCHARGE DATE: 02/11/2013  DICTATED BY: Burna Forts                               OPERATIVE REPORT    DATE OF OPERATION: 02/10/13    PREOPERATIVE DIAGNOSES:  History of right-sided breast cancer and a hematoma of  the left breast.    POSTOPERATIVE DIAGNOSES:  History of right-sided breast cancer and a hematoma of  the left breast.    PROCEDURE:  Evacuation of the hematoma from the left breast reduction.    SURGEON:  Dr. Burna Forts.    ASSISTANTS:  None.    INDICATIONS AND CONSENT:  The patient is a patient who on the day previous to  this operation underwent a right-sided lumpectomy for right breast cancer and a  simultaneous rearrangement of tissue on that side and in order to achieve  symmetry of left breast was treated with a left breast reduction.  Postoperatively, the patient had some nausea and pain control issues and kept in  the hospital.  On postoperative day #1, she developed a hematoma of the left  breast reduction.  So, at this time, the patient was taken to the operating room  for evacuation of hematoma and control of any bleeding.    PROCEDURE IN DETAIL:  The patient was taken to the operating room after having  operative sites initialed.  She underwent a smooth induction of anesthesia and  then the left breast was prepped with Betadine and drapes were applied in  sterile fashion.  The breast reduction incisions which were the Wise pattern  were opened and the breast reduction space was completely evaluated.  Upon  opening the incisions, a large  amount of coagulated hematoma was encountered.  It was noted to be along the plane superficial to the inferiorly based pedicle  which was carrying the nipple and areolar complex.  This extended manually into  the medial compartment where the reduction had been performed.  Evacuated  hematoma yielded approximately about 400 or 500 mL of clotted blood.  Once  hematoma was evacuated and the wound was irrigated I was able to identify a  venous bleeding site along the medial breast reduction space.  At this time,  thewound was irrigated with copious amounts of irrigation and any bleeding  surfaces were carefully coagulated with the electrocautery.  Once I felt that  the hemostasis was completely adequate and there was no evidence of bleeding in  any parts of the breast reduction or incisions proceeded to place a second drain  into the pocket and brought it out through a separate stab incision.  I then  proceeded to close the incisions once again.  This was done in several layers  using absorbable suture.  The nipple areolar complex at the inset was noted to  be pink with good capillary refill.  The patient did receive 2 units of blood  during the operation because she had dropped her hemoglobin to 7.5  preoperatively and plan to recheck her CBC in the PACU afterwards.    She awakened from anesthesia without difficulty.  She was transferred to the  PACU in stable condition.  There were no other complications during this  operation.  I was present for and participated in the entire operation myself.      Dict: Burna Forts, MD  AuthBurna Forts, MD  D: 02/12/2013 09:20:27  T: 02/12/2013 10:18:02  Orig. Job# [id number]  Dictation ID: [id number]

## 2013-02-21 ENCOUNTER — Ambulatory Visit: Admit: 2013-02-21 | Discharge: 2013-02-21 | Payer: PRIVATE HEALTH INSURANCE | Attending: Radiation Oncology

## 2013-02-21 DIAGNOSIS — C50919 Malignant neoplasm of unspecified site of unspecified female breast: Secondary | ICD-10-CM

## 2013-02-27 DIAGNOSIS — C50919 Malignant neoplasm of unspecified site of unspecified female breast: Secondary | ICD-10-CM

## 2013-02-27 NOTE — Unmapped (Signed)
Chief Complaint   Patient presents with   ??? Radiation Oncology Consultation       Diagnosis  Breast cancer    Primary site: Breast (Right)    Staging method: AJCC 7th Edition    Clinical: Stage IA (T1b, N0, cM0) signed by Andree Coss, MD on 02/27/2013  2:24 PM    Pathologic free text: Stage    Pathologic: Stage Unknown (TX, N0(i-)) signed by Andree Coss, MD on 02/27/2013  2:24 PM    Summary: Stage Unknown (TX, N0(i-), cM0)    Previous Treatments  12/26/2012 - Neoadjuvant TCHP  02/09/2013 - Bilateral breast reduction with right breast partial mastectomy and sentinal lymph node biopsy     History of Present Illness  I had the pleasure of seeing Wanda Rowe in consultation regarding her recent diagnosis of breast cancer. As you she is a delightful woman with a recent diagnosis of early stage, right sided breast cancer found on screening mammography. She underwent biopsy of the right breast lesion on 08/29/2012 with yielded infiltrating ductal carcinoma. The primary tumor was ER and PR negative and Her2 positive by IHC. She was evaluated by Dr Selena Batten and underwent neoadjuvant systemic therapy with carboplatin, taxotere, herceptin and perjeta. Her has dose of neoadjuvant therapy was 12/26/2012. Following this therapy she was evaluated by Dr Vanice Sarah for surgical consultation. The patient had lost a large amount of weight following a surgical weight loss procedure in December 2013 resulting in severe bilateral breast ptosis. The patient opted for right breast conservation with bilateral breast reduction and so underwent a repeat breast MRI which showed a good response. She was taken to surgery on 02/09/2013. She suffered a hematoma of the left (noncancerous) breast in the immediate post-operative period and was taken back to the OR on 02/10/2013. On pathologic review there was no residual disease in the right breast although the biopsy cavity was able to be identified. There were a total of 14 sentinel lymph nodes identified zero  of which were involved with cancer or demonstrated treatment effect.   Currently, she is feeling well. She is still recovering from the breast surgery and feels swollen and sore.     Review of Systems   Constitutional: Negative for chills, diaphoresis, activity change, appetite change, fatigue and unexpected weight change (dramatic weight loss after surgical weight loss procedure).   HENT: Negative for facial swelling and nosebleeds.    Eyes: Negative for photophobia, pain, redness and visual disturbance.   Respiratory: Negative for cough, choking, chest tightness, shortness of breath, wheezing and stridor.    Cardiovascular: Negative for chest pain, palpitations and leg swelling.   Gastrointestinal: Negative for nausea, diarrhea and constipation.   Genitourinary: Negative for dysuria, hematuria, vaginal bleeding, vaginal discharge and genital sores.   Musculoskeletal: Positive for back pain (off and on). Negative for arthralgias, gait problem, joint swelling and neck pain.   Skin: Negative for color change, pallor, rash and wound.   Neurological: Positive for numbness (hands and feet which is improving since last dose of chemo). Negative for dizziness, seizures and headaches.   Hematological: Negative for adenopathy. Does not bruise/bleed easily.   Psychiatric/Behavioral: Negative for sleep disturbance. The patient is not nervous/anxious.    All other systems reviewed and are negative.         Allergies  Succinylcholine; Zithromax; Silver; Sulfa (sulfonamide antibiotics); and Sulfanilamide    Medications  Outpatient Encounter Prescriptions as of 02/21/2013   Medication Sig Dispense Refill   ??? albuterol (PROVENTIL HFA;VENTOLIN HFA)  90 mcg/actuation inhaler Inhale 1-2 puffs into the lungs every 6 hours as needed. Q4-6H PRN  1 Inhaler  3   ??? blood sugar diagnostic (ACCU-CHEK AVIVA PLUS TEST STRP) Strp fsbs qd  100 strip  1   ??? blood-glucose meter (GLUCOSE MONITORING KIT) kit by Other route 2 (two) times daily. Use as  instructed - fsbs bid        ??? CALCIUM CITRATE ORAL Take 1 tablet by mouth daily.         ??? diabetic supplies, miscellan. Misc Lancets    Fsbs bid  100 each  3   ??? DULoxetine (CYMBALTA) 60 MG capsule Take 1 capsule (60 mg total) by mouth daily.  30 capsule  5   ??? ergocalciferol (VITAMIN D2) 50,000 unit capsule Take 1 capsule (50,000 Units total) by mouth every 14 days.  6 capsule  3   ??? fluticasone (FLONASE) 50 mcg/actuation nasal spray Use 2 sprays into each nostril daily.  16 g  5   ??? folic acid-vit B6-vit B12 (FOLBEE) 2.5-25-1 mg Tab Take 1 tablet by mouth daily.  30 each  5   ??? gabapentin (NEURONTIN) 600 MG tablet Take 1 tablet (600 mg total) by mouth 3 times a day.  90 tablet  5   ??? ipratropium (ATROVENT) 0.03 % nasal spray Use 2 sprays into each nostril 2 times a day.  30 mL  3   ??? IRON,CARBONYL (IRON CHEWS ORAL) Take by mouth.       ??? IRON,CARBONYL (IRON CHEWS ORAL) Take by mouth.       ??? levothyroxine (SYNTHROID, LEVOTHROID) 75 MCG tablet Take 1 tablet (75 mcg total) by mouth daily.  30 tablet  5   ??? montelukast (SINGULAIR) 10 mg tablet Take 1 tablet (10 mg total) by mouth at bedtime.  30 tablet  5   ??? multivitamin Chew Chew by mouth.       ??? valACYclovir (VALTREX) 500 MG tablet Take 1 tablet (500 mg total) by mouth daily.  60 tablet  3     No facility-administered encounter medications on file as of 02/21/2013.        Histories  She has a past medical history of Hyperlipidemia; Hypertension; Diabetes mellitus; Hypothyroidism; GERD (gastroesophageal reflux disease); Polyp of stomach; RSD (reflex sympathetic dystrophy); Anxiety; Asthma; and Cancer.    She has past surgical history that includes Anterior cruciate ligament repair; Meniscectomy; Dental surgery; Hysterectomy; and Foot surgery.    Her family history includes Breast cancer in her paternal grandmother; Cancer in her maternal grandmother; Colon cancer in her maternal grandfather; Diabetes in her father and maternal grandmother; Factor VIII deficiency  in her father; Hypertension in her father and mother; Other in her brother and mother; Stroke in her maternal grandmother.    She reports that she has never smoked. She has never used smokeless tobacco. She reports that she drinks alcohol.        Blood pressure 119/56, pulse 66, temperature 98.1 ??F (36.7 ??C), temperature source Temporal, resp. rate 16, height 5' 8 (1.727 m), weight 177 lb (80.287 kg).  Physical Exam   Vitals reviewed.  Constitutional: She is oriented to person, place, and time. She appears well-developed and well-nourished. No distress.   HENT:   Head: Normocephalic and atraumatic.   Mouth/Throat: No oropharyngeal exudate.   Eyes: Conjunctivae are normal. Right eye exhibits no discharge. Left eye exhibits no discharge. No scleral icterus.   Neck: Normal range of motion. Neck supple.  Cardiovascular: Normal rate, regular rhythm, normal heart sounds and intact distal pulses.  Exam reveals no gallop and no friction rub.    No murmur heard.  Pulmonary/Chest: Effort normal and breath sounds normal. She has no wheezes. She has no rales. Right breast exhibits tenderness. Right breast exhibits no inverted nipple, no mass and no nipple discharge. Left breast exhibits tenderness. Left breast exhibits no inverted nipple, no mass and no nipple discharge. Breasts are symmetrical.       Abdominal: Soft. Bowel sounds are normal.   Musculoskeletal: Normal range of motion. She exhibits no edema and no tenderness.   Lymphadenopathy:        Head (right side): No submental, no submandibular, no tonsillar, no preauricular, no posterior auricular and no occipital adenopathy present.        Head (left side): No submental, no submandibular, no tonsillar, no preauricular, no posterior auricular and no occipital adenopathy present.     She has no cervical adenopathy.        Right cervical: No superficial cervical, no deep cervical and no posterior cervical adenopathy present.       Left cervical: No superficial cervical,  no deep cervical and no posterior cervical adenopathy present.     She has no axillary adenopathy.        Right axillary: No pectoral and no lateral adenopathy present.        Left axillary: No pectoral and no lateral adenopathy present.       Right: No supraclavicular adenopathy present.        Left: No supraclavicular adenopathy present.   Neurological: She is alert and oriented to person, place, and time.   Skin: Skin is warm and dry. No rash noted. She is not diaphoretic. No erythema. No pallor.   Psychiatric: She has a normal mood and affect. Her behavior is normal. Judgment and thought content normal.         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  She underwent simultaneous breast reduction following planned weight loss from the gastric sleeve procedure. She is healing well at this time but has significant edema and ecchymosis of the breasts.     I had a long discussion with the patient and her family regarding her diagnosis, prognosis, and treatment options. I detailed the role of radiotherapy in breast conservation and discussed the multitude of modern radiation therapy techniques available.  I detailed the applicability of these techniques to her specific case and possible clinical trials available to her here at [facility].  I discussed acute and chronic toxicities of breast radiotherapy including dermatitis, fatigue, cosmesis, chest wall discomfort, rib fracture, pneumonitis, pericarditis, increased risk of heart disease, lymphedema and radiation induced  malignancy.      Disease Status: No evidence of disease  ECOG Performance Status: Fully active, able to carry on all pre-disease performance without restrictions.  Karnofsky Score: Able to  perform normal activity, minor signs and symptoms of disease.          Plan  In the end she is an excellent candidate for breast conservation including adjuvant radiotherapy targeting the right breast. She needs more time to heal before initiating therapy.  The patient also is planning a trip with her daughter at the end of March. I discussed that I would start radiotherapy between 4 and 12 weeks following her surgery.    I will plan to see her 4 weeks after her surgery (~03/13/13). If she is well healed at that time then we could initiate adjuvant, hypofractionated, right breast radiation without a boost (42.56Gy in 16 fractions). If she is not cleared for radiation at this time then I will plan to have her return during the week prior to her vacation in March and start radiotherapy a week later upon her return.  This will still be within our 3 month window of starting therapy.     The patient and her mother voiced a good understanding and agreement of the plan.     It is a pleasure to take part in the care of this kind woman and her family.

## 2013-03-14 ENCOUNTER — Ambulatory Visit: Admit: 2013-03-14 | Discharge: 2013-03-14 | Payer: PRIVATE HEALTH INSURANCE | Attending: Radiation Oncology

## 2013-03-14 DIAGNOSIS — C50919 Malignant neoplasm of unspecified site of unspecified female breast: Secondary | ICD-10-CM

## 2013-03-28 NOTE — Unmapped (Signed)
RADIATION ONCOLOGY ON-TREATMENT VISIT    Nursing Assessment:  Appetite: good   Sleeping: no problems  Nausea: No  Vomiting: No  Diarrhea: No    Fatigue:       - Feeling tired: No      - Napping Frequently: No      - Energy Level: Yes    Pain Level: 0  (Note: Pain level is assessed on a 10 point scale).  Pain Location:   Skin Reactions: none    Allergies:  Succinylcholine; Zithromax; Silver; Sulfa (sulfonamide antibiotics); and Sulfanilamide    Patient Profile:  53 y.o. Not Hispanic or Latino female with breast cancer    Current Radiation Dose: 2.66  Total Prescribed Dose: 42.56    Resident Note:  Wanda Rowe tolerated her first radiation treatment well.  She denies any new complaints today.      Physician Note:  Just starting therapy and ding well. Using skin care.     Review of Systems    Vital Signs:  Temperature 98.1 ??F (36.7 ??C), temperature source Temporal, resp. rate 16.    Physical Exam   Constitutional: She is oriented to person, place, and time. She appears well-developed and well-nourished.   HENT:   Head: Normocephalic and atraumatic.   Eyes: EOM are normal.   Pulmonary/Chest: Effort normal. No respiratory distress. Right breast exhibits no inverted nipple and no nipple discharge. Left breast exhibits no inverted nipple and no nipple discharge.       Bilateral anchor incisions, appear clean, dry and intact.     Neurological: She is alert and oriented to person, place, and time.   Skin: Skin is warm and dry.   Psychiatric: She has a normal mood and affect. Her behavior is normal.            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  She tolerated her first treatment well.  We reviewed the overall treatment plan and potential side effects with her today.                 Plan  Continue RT as planned.

## 2013-04-04 NOTE — Unmapped (Signed)
RADIATION ONCOLOGY ON-TREATMENT VISIT    Nursing Assessment:  Appetite: good   Sleeping: no problems  Nausea: No  Vomiting: No  Diarrhea: No    Fatigue:       - Feeling tired: No      - Napping Frequently: No      - Energy Level: Yes    Pain Level: 0  (Note: Pain level is assessed on a 10 point scale).  Pain Location:   Skin Reactions: pink or red    Allergies:  Succinylcholine; Zithromax; Silver; Sulfa (sulfonamide antibiotics); and Sulfanilamide    Patient Profile:  53 y.o. Not Hispanic or Latino female with breast cancer    Current Radiation Dose: 15.96  Total Prescribed Dose: 42.56    Resident Note:  Wanda Rowe continues to tolerate radiation well.  She denies breast pain or arm swelling.  She reports more frequent parathesias in both feet, but she denies pain or discomfort.  She has returned to work and is feeling more tired.    Physician Note:  Just starting therapy and feeling well. Using skin care. No issues at this time    Review of Systems    Vital Signs:  Blood pressure 117/57, pulse 64, temperature 98.1 ??F (36.7 ??C), temperature source Temporal, resp. rate 18.    Physical Exam   Constitutional: She is oriented to person, place, and time. She appears well-developed and well-nourished.   HENT:   Head: Normocephalic and atraumatic.   Eyes: EOM are normal.   Neck: Neck supple.   Pulmonary/Chest: Effort normal. No respiratory distress.   Mild erythema of right breast without desquamation   Musculoskeletal: Normal range of motion.   Neurological: She is alert and oriented to person, place, and time.   Skin: Skin is warm and dry.   Psychiatric: She has a normal mood and affect. Her behavior is normal.            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   She is overall doing well with new onset of  paresthesias that are most likely related to late effects of chemotherapy.                     Plan  Continue RT as planned.

## 2013-04-06 NOTE — Unmapped (Signed)
Chief Complaint   Patient presents with   ??? Follow-up       Diagnosis  Breast cancer    Primary site: Breast (Right)    Staging method: AJCC 7th Edition    Clinical: Stage IA (T1b, N0, cM0) signed by Andree Coss, MD on 02/27/2013  2:24 PM    Pathologic free text: Stage    Pathologic: Stage Unknown (TX, N0(i-)) signed by Andree Coss, MD on 02/27/2013  2:24 PM    Summary: Stage Unknown (TX, N0(i-), cM0)    Previous Treatments  12/26/2012 - Neoadjuvant TCHP   02/09/2013 - Bilateral breast reduction with right breast partial mastectomy and sentinal lymph node biopsy       History of Present Illness  She is feeling well today. She feels that things are healing well. She is not in pain.         Review of Systems  As above    Allergies  Succinylcholine; Zithromax; Silver; Sulfa (sulfonamide antibiotics); and Sulfanilamide    Medications  Outpatient Encounter Prescriptions as of 03/14/2013   Medication Sig Dispense Refill   ??? albuterol (PROVENTIL HFA;VENTOLIN HFA) 90 mcg/actuation inhaler Inhale 1-2 puffs into the lungs every 6 hours as needed. Q4-6H PRN  1 Inhaler  3   ??? blood sugar diagnostic (ACCU-CHEK AVIVA PLUS TEST STRP) Strp fsbs qd  100 strip  1   ??? blood-glucose meter (GLUCOSE MONITORING KIT) kit by Other route 2 (two) times daily. Use as instructed - fsbs bid        ??? CALCIUM CITRATE ORAL Take 1 tablet by mouth daily.         ??? diabetic supplies, miscellan. Misc Lancets    Fsbs bid  100 each  3   ??? DULoxetine (CYMBALTA) 60 MG capsule Take 1 capsule (60 mg total) by mouth daily.  30 capsule  5   ??? ergocalciferol (VITAMIN D2) 50,000 unit capsule Take 1 capsule (50,000 Units total) by mouth every 14 days.  6 capsule  3   ??? fluticasone (FLONASE) 50 mcg/actuation nasal spray Use 2 sprays into each nostril daily.  16 g  5   ??? folic acid-vit B6-vit B12 (FOLBEE) 2.5-25-1 mg Tab Take 1 tablet by mouth daily.  30 each  5   ??? gabapentin (NEURONTIN) 600 MG tablet Take 1 tablet (600 mg total) by mouth 3 times a day.  90 tablet   5   ??? ipratropium (ATROVENT) 0.03 % nasal spray Use 2 sprays into each nostril 2 times a day.  30 mL  3   ??? IRON,CARBONYL (IRON CHEWS ORAL) Take by mouth.       ??? IRON,CARBONYL (IRON CHEWS ORAL) Take by mouth.       ??? levothyroxine (SYNTHROID, LEVOTHROID) 75 MCG tablet Take 1 tablet (75 mcg total) by mouth daily.  30 tablet  5   ??? montelukast (SINGULAIR) 10 mg tablet Take 1 tablet (10 mg total) by mouth at bedtime.  30 tablet  5   ??? multivitamin Chew Chew by mouth.       ??? valACYclovir (VALTREX) 500 MG tablet Take 1 tablet (500 mg total) by mouth daily.  60 tablet  3     No facility-administered encounter medications on file as of 03/14/2013.        Histories  She has a past medical history of Hyperlipidemia; Hypertension; Diabetes mellitus; Hypothyroidism; GERD (gastroesophageal reflux disease); Polyp of stomach; RSD (reflex sympathetic dystrophy); Anxiety; Asthma; and Cancer.    She has  past surgical history that includes Anterior cruciate ligament repair; Meniscectomy; Dental surgery; Hysterectomy; and Foot surgery.    Her family history includes Breast cancer in her paternal grandmother; Cancer in her maternal grandmother; Colon cancer in her maternal grandfather; Diabetes in her father and maternal grandmother; Factor VIII deficiency in her father; Hypertension in her father and mother; Other in her brother and mother; Stroke in her maternal grandmother.    She reports that she has never smoked. She has never used smokeless tobacco. She reports that she drinks alcohol.        Blood pressure 117/45, pulse 77, temperature 97.7 ??F (36.5 ??C), temperature source Temporal, resp. rate 16.  Physical Exam    The breasts are much less swollen and the ecchymosis is greatly reduced.   All incisions are well healed and closed.    Diagnostic Studies Reviewed  none    Review of Lab Results  Lab Results   Component Value Date    WBC 5.1 01/19/2013    RBC 2.87* 01/19/2013    HGB 9.1* 01/19/2013    HCT 27.7* 01/19/2013    MCV  96.6 01/19/2013    MCH 31.8 01/19/2013    MCHC 32.9 01/19/2013    RDW 18.5* 01/19/2013    PLT 170 01/19/2013    MPV 10.9 10/29/2004          Assessment  Ms. Vahle has healed quite well. She is ready to start adjuvant breast radiotherapy.I had a long discussion with the patient and her family regarding her diagnosis, prognosis, and treatment options. I detailed the role of radiotherapy in breast conservation and discussed the multitude of modern radiation therapy techniques available.  I detailed the applicability of these techniques to her specific case and possible clinical trials available to her here at [facility].  I discussed acute and chronic toxicities of breast radiotherapy including dermatitis, fatigue, cosmesis, chest wall discomfort, rib fracture, pneumonitis, pericarditis, increased risk of heart disease, lymphedema and radiation induced malignancy. We specifically discussed the role of boost of radiotherapy and the benefit seen in younger woman, such as her. I did explain that there needs to be a clear target to boost, such has a post-lumpectomy seroma. I am not sure that a seroma will exist in her breast given the simultaneous breast reduction which occurred.                       Plan  I will get her radiation planning performed today. She will receive hypofractionated breast therapy, 42.56Gy in 16 fractions. If there is a post-lumpectomy seroma to target she will then receive a sequential boost of 10Gy in 4 fractions to the surgical bed.     It is a pleasure to take part in the care of this kind woman and her family.

## 2013-04-11 NOTE — Unmapped (Signed)
RADIATION ONCOLOGY ON-TREATMENT VISIT    Nursing Assessment:  Appetite: good   Sleeping: no problems  Nausea: No  Vomiting: No  Diarrhea: No    Fatigue:       - Feeling tired: Yes      - Napping Frequently: No      - Energy Level: Yes    Pain Level: 0  (Note: Pain level is assessed on a 10 point scale).  Pain Location:   Skin Reactions: pink or red    Allergies:  Succinylcholine; Zithromax; Silver; Sulfa (sulfonamide antibiotics); and Sulfanilamide    Patient Profile:  53 y.o. Not Hispanic or Latino female with right breast cancer    Current Radiation Dose: 29.26  Total Prescribed Dose: 42.56    Resident Note:  Wanda Rowe continues to tolerate radiotherapy well.  She reports worsening fatigue and feels congestion.  She is using skin care.    Physician Note:  Feeling well. Mild fatigue. Thinks she is getting a cold. Using skin care. Frequent fatigue    Review of Systems    Vital Signs:  Blood pressure 109/70, pulse 67, temperature 98.1 ??F (36.7 ??C), temperature source Other, resp. rate 16.    Physical Exam   Constitutional: She is oriented to person, place, and time. She appears well-developed and well-nourished.   HENT:   Head: Normocephalic and atraumatic.   Eyes: EOM are normal.   Pulmonary/Chest: Effort normal. No respiratory distress.   Mild right breast dermatitis, axilla normal appearing   Neurological: She is alert and oriented to person, place, and time.   Skin: Skin is warm and dry.   Psychiatric: She has a normal mood and affect. Her behavior is normal.       Grade 1 dermatitis with a bit of edema. Nothing in the axilla. Looks well overall.       Assessment  Doing well. Discussed course of therapy toxicities, timing and alleviating factors.  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 She is tolerating RT well with  mild fatigue.                     Plan  Continue radiotherapy as previously prescribed  See preXRt next week then FU 4 weeks continuing skin care during that time.

## 2013-04-14 DIAGNOSIS — S83289A Other tear of lateral meniscus, current injury, unspecified knee, initial encounter: Secondary | ICD-10-CM | POA: Insufficient documentation

## 2013-04-18 NOTE — Unmapped (Signed)
RadOnc Final Therapy Note       Wanda Rowe is a 53 y.o., Not Hispanic or Latino, female with Breast cancer    Primary site: Breast (Right)    Staging method: AJCC 7th Edition    Clinical: Stage IA (T1b, N0, cM0) signed by Andree Coss, MD on 02/27/2013  2:24 PM    Pathologic free text: Stage    Pathologic: Stage Unknown (TX, N0(i-)) signed by Andree Coss, MD on 02/27/2013  2:24 PM    Summary: Stage Unknown (TX, N0(i-), cM0) cancer of the right breast.     Attending Physician: Fransisco Beau    Location of Treatment: [facility]    Site of Radiation/Area of Interest: right breast    Course #: 1    Treatment Data    Type of Radiation: Adjuvant    Was a Systemic Therapy agent used in conjunction with Radiotherapy? No    Radiation Modality:  3D Conformal     Beam Energy: 6 MV    Image Guidance: Not Daily     Radiotherapy:    Start Date: 02/ 24/, 2015     End Date: 03/ 17/, 2015           RT Dose per Fraction (Gy): 2.66 RT Total Fraction Count: 16 RT Total Dose (Gy): 42.56  Elapsed Days: 22    Treatment Course: [name] completed hypofractionated radiotherapy to her right breast on 04/18/2013.  She overall tolerated treatment very well.  She experienced fatigue and mild skin irritation and discomfort at the treatment site.  A boost could not be performed as there was not an identifiable seroma/ tumor bed to target. Given that the patient had a complete pathologic response after chemotherapy the additional dose of radiation to the involved quadrant at diagnosis was felt to offer more risk than benefit.        Treatment Interruptions and Modifications    Was radiotherapy interrupted?  No    Reason Treatment Ended: Treatment completed per protocol    Miscellaneous Data    Feeding tube present at completion of therapy?  No.     RTOG Acute Radiation Morbidity Scoring Criteria    Skin:  1 - Follicular, faint or dull erythema/epilation/dry desquamation/decreased sweating    Follow Up: 3-4 weeks.

## 2013-04-18 NOTE — Unmapped (Signed)
RADIATION ONCOLOGY ON-TREATMENT VISIT    Nursing Assessment:  Appetite: good   Sleeping: no problems  Nausea: No  Vomiting: No  Diarrhea: No    Fatigue:       - Feeling tired: No      - Napping Frequently: No      - Energy Level: Yes    Pain Level: 2  (Note: Pain level is assessed on a 10 point scale).  Pain Location: skin  Skin Reactions: pink or red    Allergies:  Succinylcholine; Zithromax; Silver; Sulfa (sulfonamide antibiotics); and Sulfanilamide    Patient Profile:  53 y.o. Not Hispanic or Latino female with right breast cancer    Current Radiation Dose: 39.9  Total Prescribed Dose: 42.56    Resident Note:  Ms. Borrayo has her final radiation treatment today.  She complains of fatigue and mild skin irritation and discomfort.  She denies SOB, cough, fever and chills.  She is looking forward to her trip to [facility] in a few weeks.    Physician Note:  Doing well. Using skin care. Some fatigue and discomfort but not impacting life    Review of Systems    Vital Signs:  Blood pressure 118/69, pulse 67, temperature 98.1 ??F (36.7 ??C), resp. rate 16.    Physical Exam    Constitutional: She is oriented to person, place, and time. She appears well-developed and well-nourished.   HENT:   Head: Normocephalic and atraumatic.   Eyes: EOM are normal.   Pulmonary/Chest: Effort normal. No respiratory distress.   Grade 1 dermatitis of right breast, axilla normal appearing   Neurological: She is alert and oriented to person, place, and time.   Skin: Skin is warm and dry.   Psychiatric: She has a normal mood and affect. Her behavior is normal.        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 She has her final radiation treatment today and is tolerating therapy well.  Encouraged continued use of lotions/creams for at least the next  three weeks.                     Plan  Continue RT as planned.  Follow up in clinic in 3-4 weeks.

## 2013-04-25 MED ORDER — amoxicillin (AMOXIL) 500 MG capsule
500 | ORAL_CAPSULE | Freq: Two times a day (BID) | ORAL | Status: AC
Start: 2013-04-25 — End: 2013-06-06

## 2013-05-16 ENCOUNTER — Ambulatory Visit: Admit: 2013-05-16 | Discharge: 2013-05-16 | Payer: PRIVATE HEALTH INSURANCE | Attending: Radiation Oncology

## 2013-05-16 DIAGNOSIS — C50919 Malignant neoplasm of unspecified site of unspecified female breast: Secondary | ICD-10-CM

## 2013-06-05 NOTE — Unmapped (Signed)
Can you see pt tomorrow for sore bump outside annual area=-and getting worse. Could not come in today. Call pt

## 2013-06-05 NOTE — Unmapped (Signed)
ok 

## 2013-06-05 NOTE — Unmapped (Signed)
appt scheduled

## 2013-06-05 NOTE — Unmapped (Signed)
LMTCB

## 2013-06-06 ENCOUNTER — Ambulatory Visit: Admit: 2013-06-06 | Payer: PRIVATE HEALTH INSURANCE

## 2013-06-06 DIAGNOSIS — L089 Local infection of the skin and subcutaneous tissue, unspecified: Secondary | ICD-10-CM

## 2013-06-06 MED ORDER — amoxicillin-clavulanate (AUGMENTIN) 875-125 mg per tablet
875-125 | ORAL_TABLET | Freq: Two times a day (BID) | ORAL | Status: AC
Start: 2013-06-06 — End: 2013-08-03

## 2013-06-06 NOTE — Unmapped (Signed)
augmentin

## 2013-06-06 NOTE — Unmapped (Signed)
Subjective  HPI:   Patient ID: Wanda Rowe is a 53 y.o. female.    Chief Complaint:  HPI Comments: Anal lesion since Friday, tried etoh on it now if urinates hurts             Medications:  Current Outpatient Prescriptions   Medication Sig Dispense Refill   ??? aspirin 81 MG EC tablet Take 81 mg by mouth daily.       ??? blood sugar diagnostic (ACCU-CHEK AVIVA PLUS TEST STRP) Strp fsbs qd  100 strip  1   ??? blood-glucose meter (GLUCOSE MONITORING KIT) kit by Other route 2 (two) times daily. Use as instructed - fsbs bid        ??? CALCIUM CITRATE ORAL Take 1 tablet by mouth daily.         ??? diabetic supplies, miscellan. Misc Lancets    Fsbs bid  100 each  3   ??? DULoxetine (CYMBALTA) 60 MG capsule Take 1 capsule (60 mg total) by mouth daily.  30 capsule  5   ??? ergocalciferol (VITAMIN D2) 50,000 unit capsule Take 1 capsule (50,000 Units total) by mouth every 14 days.  6 capsule  3   ??? fluticasone (FLONASE) 50 mcg/actuation nasal spray Use 2 sprays into each nostril daily.  16 g  5   ??? folic acid-vit B6-vit B12 (FOLBEE) 2.5-25-1 mg Tab Take 1 tablet by mouth daily.  30 each  5   ??? gabapentin (NEURONTIN) 600 MG tablet Take 1 tablet (600 mg total) by mouth 3 times a day.  90 tablet  5   ??? ipratropium (ATROVENT) 0.03 % nasal spray Use 2 sprays into each nostril 2 times a day.  30 mL  3   ??? IRON,CARBONYL (IRON CHEWS ORAL) Take by mouth.       ??? levothyroxine (SYNTHROID, LEVOTHROID) 75 MCG tablet Take 1 tablet (75 mcg total) by mouth daily.  30 tablet  5   ??? montelukast (SINGULAIR) 10 mg tablet Take 1 tablet (10 mg total) by mouth at bedtime.  30 tablet  5   ??? multivitamin Chew Chew by mouth.       ??? raloxifene (EVISTA) 60 mg tablet Take 60 mg by mouth daily.       ??? TRASTUZUMAB (HERCEPTIN IV) Inject into the vein. Every 3 weeks       ??? albuterol (PROVENTIL HFA;VENTOLIN HFA) 90 mcg/actuation inhaler Inhale 1-2 puffs into the lungs every 6 hours as needed. Q4-6H PRN  1 Inhaler  3   ??? amoxicillin (AMOXIL) 500 MG capsule Take 1  capsule (500 mg total) by mouth 2 times a day.  20 capsule  0   ??? IRON,CARBONYL (IRON CHEWS ORAL) Take by mouth.       ??? valACYclovir (VALTREX) 500 MG tablet Take 1 tablet (500 mg total) by mouth daily.  60 tablet  3     No current facility-administered medications for this visit.        ROS:   Review of Systems   Constitutional: Negative for fatigue.   All other systems reviewed and are negative.           Objective:   Physical Exam   Constitutional: She appears well-developed and well-nourished.   Cardiovascular: Normal rate, regular rhythm and normal heart sounds.    Pulmonary/Chest: Effort normal and breath sounds normal.   Skin:   6 oclock pustule anus   Psychiatric: She has a normal mood and affect. Her behavior is normal.  Filed Vitals:    06/06/13 1338   BP: 112/76   Pulse: 76   Resp: 12   Height: 5' 8 (1.727 m)   Weight: 178 lb (80.74 kg)     Body mass index is 27.07 kg/(m^2).  Body surface area is 1.97 meters squared.                Assessment/Plan:     Patient Active Problem List   Diagnosis   ??? Routine general medical examination at a health care facility   ??? Unspecified vitamin D deficiency   ??? Anxiety state, unspecified   ??? Unspecified asthma   ??? Hypothyroid   ??? Obesity   ??? RSD lower limb   ??? Breast cancer   ??? Folliculitis   ??? Sinusitis, acute

## 2013-06-13 NOTE — Unmapped (Signed)
Chief Complaint   Patient presents with   ??? Follow-up       Diagnosis  Breast cancer    Primary site: Breast (Right)    Staging method: AJCC 7th Edition    Clinical: Stage IA (T1b, N0, cM0) signed by Andree Coss, MD on 02/27/2013  2:24 PM    Pathologic free text: Stage    Pathologic: Stage Unknown (TX, N0(i-)) signed by Andree Coss, MD on 02/27/2013  2:24 PM    Summary: Stage Unknown (TX, N0(i-), cM0)    Previous Treatments  12/26/2012 - Neoadjuvant TCHP   02/09/2013 - Bilateral breast reduction with right breast partial mastectomy and sentinal lymph node biopsy  04/18/2013 - Adjuvant right breast whole breast radiotherapy 42.56Gy in 16Fx without boost due to lack of seroma target     History of Present Illness  Ms sutley returns today for routine follow-up after completing therapy about a month ago. She is doing well. She has some intermittent aches and pains in the breast. She denies nipple drainage or skin changes. She has no other concerns at this time.         Review of Systems  As above    Allergies  Succinylcholine; Zithromax; Silver; Sulfa (sulfonamide antibiotics); and Sulfanilamide    Medications  Outpatient Encounter Prescriptions as of 05/16/2013   Medication Sig Dispense Refill   ??? albuterol (PROVENTIL HFA;VENTOLIN HFA) 90 mcg/actuation inhaler Inhale 1-2 puffs into the lungs every 6 hours as needed. Q4-6H PRN  1 Inhaler  3   ??? blood sugar diagnostic (ACCU-CHEK AVIVA PLUS TEST STRP) Strp fsbs qd  100 strip  1   ??? blood-glucose meter (GLUCOSE MONITORING KIT) kit by Other route 2 (two) times daily. Use as instructed - fsbs bid        ??? CALCIUM CITRATE ORAL Take 1 tablet by mouth daily.         ??? diabetic supplies, miscellan. Misc Lancets    Fsbs bid  100 each  3   ??? DULoxetine (CYMBALTA) 60 MG capsule Take 1 capsule (60 mg total) by mouth daily.  30 capsule  5   ??? ergocalciferol (VITAMIN D2) 50,000 unit capsule Take 1 capsule (50,000 Units total) by mouth every 14 days.  6 capsule  3   ??? fluticasone  (FLONASE) 50 mcg/actuation nasal spray Use 2 sprays into each nostril daily.  16 g  5   ??? folic acid-vit B6-vit B12 (FOLBEE) 2.5-25-1 mg Tab Take 1 tablet by mouth daily.  30 each  5   ??? gabapentin (NEURONTIN) 600 MG tablet Take 1 tablet (600 mg total) by mouth 3 times a day.  90 tablet  5   ??? ipratropium (ATROVENT) 0.03 % nasal spray Use 2 sprays into each nostril 2 times a day.  30 mL  3   ??? IRON,CARBONYL (IRON CHEWS ORAL) Take by mouth.       ??? IRON,CARBONYL (IRON CHEWS ORAL) Take by mouth.       ??? levothyroxine (SYNTHROID, LEVOTHROID) 75 MCG tablet Take 1 tablet (75 mcg total) by mouth daily.  30 tablet  5   ??? montelukast (SINGULAIR) 10 mg tablet Take 1 tablet (10 mg total) by mouth at bedtime.  30 tablet  5   ??? multivitamin Chew Chew by mouth.       ??? valACYclovir (VALTREX) 500 MG tablet Take 1 tablet (500 mg total) by mouth daily.  60 tablet  3   ??? [DISCONTINUED] amoxicillin (AMOXIL) 500 MG capsule Take  1 capsule (500 mg total) by mouth 2 times a day.  20 capsule  0     No facility-administered encounter medications on file as of 05/16/2013.        Histories  She has a past medical history of Hyperlipidemia; Hypertension; Diabetes mellitus; Hypothyroidism; GERD (gastroesophageal reflux disease); Polyp of stomach; RSD (reflex sympathetic dystrophy); Anxiety; Asthma; and Cancer.    She has past surgical history that includes Anterior cruciate ligament repair; Meniscectomy; Dental surgery; Hysterectomy; and Foot surgery.    Her family history includes Breast cancer in her paternal grandmother; Cancer in her maternal grandmother; Colon cancer in her maternal grandfather; Diabetes in her father and maternal grandmother; Factor VIII deficiency in her father; Hypertension in her father and mother; Other in her brother and mother; Stroke in her maternal grandmother.    She reports that she has never smoked. She has never used smokeless tobacco. She reports that she drinks alcohol.        Blood pressure 120/81, pulse  64, temperature 97.5 ??F (36.4 ??C), resp. rate 16.  Physical Exam    Resolving mild hyperpigmentation of the right breast. A bit of dry desquamation but healthy dermis beneath. There is no adenopathy. Her lungs are clear and the heart is regular.     Diagnostic Studies Reviewed      Review of Lab Results  Lab Results   Component Value Date    WBC 5.1 01/19/2013    RBC 2.87* 01/19/2013    HGB 9.1* 01/19/2013    HCT 27.7* 01/19/2013    MCV 96.6 01/19/2013    MCH 31.8 01/19/2013    MCHC 32.9 01/19/2013    RDW 18.5* 01/19/2013    PLT 170 01/19/2013    MPV 10.9 10/29/2004          Assessment  Ms. Ritacco is doing well following the course of adjuvant radiotherapy. She has basically recovered from the acute toxicities of therapy.                      Plan  I will see her back in about 6 months. She will continue to follow with her medical and surgical oncologists .  It is a pleasure to take part in the care of this kind woman and her family.

## 2013-06-29 NOTE — Unmapped (Signed)
Please put lab orders in for an upcoming appt

## 2013-07-28 LAB — CBC
Hematocrit: 36.4 % (ref 35.0–45.0)
Hemoglobin: 12.1 g/dL (ref 11.7–15.5)
MCH: 30.8 pg (ref 27.0–33.0)
MCHC: 33.1 g/dL (ref 32.0–36.0)
MCV: 93.1 fL (ref 80.0–100.0)
Platelets: 176 10*3/uL (ref 140–400)
RBC: 3.91 10*6/uL (ref 3.80–5.10)
RDW: 13.9 % (ref 11.0–15.0)
WBC: 4.3 10*3/uL (ref 3.8–10.8)

## 2013-07-28 LAB — CHOL/HDLC RATIO: Chol/HDL Ratio: 2.4 (calc) (ref <=5.0)

## 2013-07-28 LAB — COMPREHENSIVE METABOLIC PANEL
A/G Ratio: 1.8 (calc) (ref 1.0–2.5)
ALT: 17 U/L (ref 6–29)
AST: 24 U/L (ref 10–35)
Albumin: 4.2 g/dL (ref 3.6–5.1)
Alkaline Phosphatase: 66 U/L (ref 33–130)
BUN: 21 mg/dL (ref 7–25)
CO2: 27 mmol/L (ref 19–30)
Calcium: 9.3 mg/dL (ref 8.6–10.4)
Chloride: 104 mmol/L (ref 98–110)
Creatinine: 0.64 mg/dL (ref 0.50–1.05)
GFR MDRD Af Amer: 119 mL/min/{1.73_m2} (ref >=60)
Globulin, Total: 2.4 g/dL (calc) (ref 1.9–3.7)
Glucose: 86 mg/dL (ref 65–99)
Potassium: 4.1 mmol/L (ref 3.5–5.3)
Sodium: 140 mmol/L (ref 135–146)
Total Bilirubin: 0.6 mg/dL (ref 0.2–1.2)
Total Protein: 6.6 g/dL (ref 6.1–8.1)
eGFR Non-Afr. American: 103 mL/min/{1.73_m2} (ref >=60)

## 2013-07-28 LAB — VITAMIN D 25 HYDROXY: Vit D, 25-Hydroxy: 26 ng/mL (ref 30–100)

## 2013-07-28 LAB — HEMOGLOBIN A1C: Hemoglobin A1C: 5.3 %{Hb} (ref <5.7)

## 2013-07-28 LAB — FERRITIN: Ferritin: 38 ng/mL (ref 10–232)

## 2013-07-28 LAB — TSH: TSH: 1.26 mIU/L

## 2013-07-28 LAB — FOLATE: Folic Acid: >24 ng/mL

## 2013-07-28 LAB — VITAMIN B12: Vitamin B-12: 716 pg/mL (ref 200–1100)

## 2013-07-28 LAB — CHOLESTEROL, TOTAL: Cholesterol, Total: 172 mg/dL (ref 125–200)

## 2013-07-28 LAB — HDL CHOLESTEROL: HDL: 73 mg/dL (ref >=46)

## 2013-07-28 LAB — T4, FREE: Free T4: 0.9 ng/dL (ref 0.8–1.8)

## 2013-07-28 LAB — T3, FREE: T3, Free: 2.8 pg/mL (ref 2.3–4.2)

## 2013-07-28 LAB — IRON: Iron: 89 ug/dL (ref 45–160)

## 2013-07-28 LAB — TRIGLYCERIDES: Triglycerides: 90 mg/dL (ref <150)

## 2013-07-28 LAB — NON-HDL CHOLESTEROL: Non HDL Chol. (LDL+VLDL): 99 mg/dL (calc)

## 2013-07-28 LAB — HOMOCYSTEINE, CARDIOVASCULAR: Homocysteine, Cardiovascular: 6.5 umol/L (ref <10.4)

## 2013-07-28 LAB — LDL-CHOLESTEROL: LDL Calculated: 81 mg/dL (ref <130)

## 2013-08-03 ENCOUNTER — Ambulatory Visit: Admit: 2013-08-03 | Discharge: 2013-08-03 | Payer: PRIVATE HEALTH INSURANCE

## 2013-08-03 DIAGNOSIS — F419 Anxiety disorder, unspecified: Secondary | ICD-10-CM

## 2013-08-03 MED ORDER — thyroid, pork, (ARMOUR) 120 mg Tab
120 | ORAL_TABLET | Freq: Every day | ORAL | 1.00 refills | 60.00000 days | Status: AC
Start: 2013-08-03 — End: 2013-11-16

## 2013-08-03 MED ORDER — ergocalciferol (VITAMIN D2) 50,000 unit capsule
1250 | ORAL_CAPSULE | ORAL | Status: AC
Start: 2013-08-03 — End: 2013-08-03

## 2013-08-03 MED ORDER — cholecalciferol, vitamin D3, 5,000 unit capsule
125 | ORAL_CAPSULE | Freq: Every day | ORAL | 3.00 refills | 30.00000 days | Status: AC
Start: 2013-08-03 — End: 2014-01-24

## 2013-08-03 MED ORDER — gabapentin (NEURONTIN) 600 MG tablet
600 | ORAL_TABLET | Freq: Two times a day (BID) | ORAL | Status: AC
Start: 2013-08-03 — End: 2013-11-16

## 2013-08-03 MED ORDER — folic acid-vit B6-vit B12 (FOLBEE) 2.5-25-1 mg Tab
2.5-25-1 | Freq: Every day | ORAL | 2.00 refills | 30.00000 days | Status: AC
Start: 2013-08-03 — End: 2013-11-16

## 2013-08-03 MED ORDER — ferrous sulfate 325 (65 FE) MG tablet
325 | ORAL_TABLET | Freq: Every day | ORAL | Status: AC
Start: 2013-08-03 — End: 2013-11-16

## 2013-08-03 MED ORDER — methylPREDNISolone (MEDROL, PAK,) 4 mg tablet
4 | PACK | ORAL | Status: AC
Start: 2013-08-03 — End: 2013-11-16

## 2013-08-03 MED ORDER — DULoxetine (CYMBALTA) 60 MG capsule
60 | ORAL_CAPSULE | Freq: Every day | ORAL | Status: AC
Start: 2013-08-03 — End: 2013-11-16

## 2013-08-03 NOTE — Unmapped (Addendum)
Subjective  HPI:   Patient ID: Wanda Rowe is a 53 y.o. female.    Chief Complaint:  HPI Comments: F/u thyroid, b12/iron, vit d, neuropathy oa/depression getting ready to loose job still on herceptin evista added. Doesn't feel well feels foggy, onc thinks may be post chemo. 2-3 days right shoulder tenderness and trouble lifting shoulder.             Medications:  Current Outpatient Prescriptions   Medication Sig Dispense Refill   ??? albuterol (PROVENTIL HFA;VENTOLIN HFA) 90 mcg/actuation inhaler Inhale 1-2 puffs into the lungs every 6 hours as needed. Q4-6H PRN  1 Inhaler  3   ??? aspirin 81 MG EC tablet Take 81 mg by mouth daily.       ??? blood sugar diagnostic (ACCU-CHEK AVIVA PLUS TEST STRP) Strp fsbs qd  100 strip  1   ??? blood-glucose meter (GLUCOSE MONITORING KIT) kit by Other route 2 (two) times daily. Use as instructed - fsbs bid        ??? CALCIUM CITRATE ORAL Take 1 tablet by mouth daily.         ??? DULoxetine (CYMBALTA) 60 MG capsule Take 1 capsule (60 mg total) by mouth daily.  30 capsule  5   ??? ergocalciferol (VITAMIN D2) 50,000 unit capsule Take 1 capsule (50,000 Units total) by mouth every 14 days.  6 capsule  3   ??? fluticasone (FLONASE) 50 mcg/actuation nasal spray Use 2 sprays into each nostril daily.  16 g  5   ??? folic acid-vit B6-vit B12 (FOLBEE) 2.5-25-1 mg Tab Take 1 tablet by mouth daily.  30 each  5   ??? gabapentin (NEURONTIN) 600 MG tablet Take 1 tablet (600 mg total) by mouth 3 times a day.  90 tablet  5   ??? ipratropium (ATROVENT) 0.03 % nasal spray Use 2 sprays into each nostril 2 times a day.  30 mL  3   ??? multivitamin Chew Chew by mouth.       ??? raloxifene (EVISTA) 60 mg tablet Take 60 mg by mouth daily.       ??? TRASTUZUMAB (HERCEPTIN IV) Inject into the vein. Every 3 weeks       ??? ferrous sulfate 325 (65 FE) MG tablet Take 1 tablet (325 mg total) by mouth daily with breakfast.  30 tablet  0     No current facility-administered medications for this visit.        ROS:   Review of Systems      Constitutional: Positive for fatigue.   All other systems reviewed and are negative.         Objective:   Physical Exam   Constitutional: She appears well-developed and well-nourished.   HENT:   Head: Normocephalic and atraumatic.   Cardiovascular: Normal rate, regular rhythm and normal heart sounds.  Exam reveals no gallop and no friction rub.    No murmur heard.  Pulmonary/Chest: Effort normal and breath sounds normal.   Abdominal: Soft. Bowel sounds are normal.   Musculoskeletal: Normal range of motion.   Psychiatric: She has a normal mood and affect. Her behavior is normal.             Filed Vitals:    08/03/13 0908   BP: 114/80   Pulse: 72   Resp: 12   Height: 5' 8 (1.727 m)   Weight: 189 lb (85.73 kg)     Body mass index is 28.74 kg/(m^2).  Body surface area is  2.03 meters squared.                Assessment/Plan:     Patient Active Problem List   Diagnosis   ??? Routine general medical examination at a health care facility   ??? Unspecified vitamin D deficiency   ??? Anxiety state, unspecified   ??? Unspecified asthma   ??? Hypothyroid   ??? Obesity   ??? RSD lower limb   ??? Breast cancer   ??? Folliculitis   ??? Sinusitis, acute   ??? Pustule

## 2013-08-03 NOTE — Unmapped (Signed)
Switch to armour

## 2013-08-03 NOTE — Unmapped (Signed)
Addended byRico Junker, Darnel Mchan on: 08/03/2013 10:05 AM     Modules accepted: Orders

## 2013-08-03 NOTE — Unmapped (Signed)
cymbalta 

## 2013-08-03 NOTE — Unmapped (Signed)
switch to daily

## 2013-08-03 NOTE — Unmapped (Signed)
Instructed pt to continue current meds

## 2013-08-29 MED ORDER — montelukast (SINGULAIR) 10 mg tablet
10 | ORAL_TABLET | ORAL | Status: AC
Start: 2013-08-29 — End: 2013-11-16

## 2013-11-07 NOTE — Unmapped (Signed)
Pt picked up her lab orders today but wanted to know if she needs to have A1C or any of the other usual labs.   Pt plans to go to her lab on Friday morning

## 2013-11-08 NOTE — Unmapped (Signed)
No its been good since she lost weight i can add if she wants

## 2013-11-08 NOTE — Unmapped (Signed)
LMTCB

## 2013-11-09 NOTE — Unmapped (Signed)
LMTCB

## 2013-11-09 NOTE — Unmapped (Signed)
Spoke with patient and gave message

## 2013-11-10 LAB — THYROID PEROXIDASE ANTIBODY: Thyroid Peroxidase Ab: 1 IU/mL (ref <9)

## 2013-11-10 LAB — T4, FREE: Free T4: 0.8 ng/dL (ref 0.8–1.8)

## 2013-11-10 LAB — T3, FREE: T3, Free: 3.3 pg/mL (ref 2.3–4.2)

## 2013-11-10 LAB — VITAMIN D 25 HYDROXY: Vit D, 25-Hydroxy: 30 ng/mL (ref 30–100)

## 2013-11-10 LAB — THYROGLOBULIN ANTIBODY: Thyroglobulin Ab: <1 IU/mL (ref <=1)

## 2013-11-10 LAB — TSH: TSH: 0.28 mIU/L

## 2013-11-13 ENCOUNTER — Encounter: Payer: PRIVATE HEALTH INSURANCE | Attending: Radiation Oncology

## 2013-11-13 ENCOUNTER — Ambulatory Visit: Admit: 2013-11-13 | Discharge: 2013-11-13 | Payer: PRIVATE HEALTH INSURANCE | Attending: Radiation Oncology

## 2013-11-13 DIAGNOSIS — C50919 Malignant neoplasm of unspecified site of unspecified female breast: Secondary | ICD-10-CM

## 2013-11-13 DIAGNOSIS — N952 Postmenopausal atrophic vaginitis: Secondary | ICD-10-CM | POA: Insufficient documentation

## 2013-11-13 NOTE — Unmapped (Signed)
Chief Complaint   Patient presents with   ??? Follow-up       Diagnosis  Breast cancer    Primary site: Breast (Right)    Staging method: AJCC 7th Edition    Clinical: Stage IA (T1b, N0, cM0) signed by Andree Coss, MD on 02/27/2013  2:24 PM    Pathologic free text: Stage    Pathologic: Stage Unknown (TX, N0(i-)) signed by Andree Coss, MD on 02/27/2013  2:24 PM    Summary: Stage Unknown (TX, N0(i-), cM0)    Previous Treatments  12/26/2012 - Neoadjuvant TCHP   02/09/2013 - Bilateral breast reduction with right breast partial mastectomy and sentinal lymph node biopsy   04/18/2013 - Adjuvant right breast whole breast radiotherapy 42.56Gy in 16Fx without boost due to lack of seroma target       History of Present Illness  Ms. Veras returns to clinic today for scheduled follow-up.  She reports that she is doing well.  Patient did notice some lymphema of the breast and was referred for therapy which she notes that she probably does a bit less than she should (every couple of days).   She has some associated soreness/sensitivity of the R breast which is more of a nuisance than concern at this time.  Energy levels returning back to normal.  Denies any persistent skin irritation or changes.        Review of Systems   Constitutional: Negative for weight loss, weight gain and fatigue.   Respiratory: Negative for cough and shortness of breath.    Cardiovascular: Negative for chest pain.   Gastrointestinal: Negative for abdominal pain and constipation.   Genitourinary: Negative for dysuria.   All other systems reviewed and are negative.      Allergies  Succinylcholine; Zithromax; Silver; Sulfa (sulfonamide antibiotics); and Sulfanilamide    Medications  Outpatient Encounter Prescriptions as of 11/13/2013   Medication Sig Dispense Refill   ??? albuterol (PROVENTIL HFA;VENTOLIN HFA) 90 mcg/actuation inhaler Inhale 1-2 puffs into the lungs every 6 hours as needed. Q4-6H PRN  1 Inhaler  3   ??? aspirin 81 MG EC tablet Take 81 mg by mouth  daily.       ??? blood sugar diagnostic (ACCU-CHEK AVIVA PLUS TEST STRP) Strp fsbs qd  100 strip  1   ??? blood-glucose meter (GLUCOSE MONITORING KIT) kit by Other route 2 (two) times daily. Use as instructed - fsbs bid        ??? CALCIUM CITRATE ORAL Take 1 tablet by mouth daily.         ??? cholecalciferol, vitamin D3, 5,000 unit capsule Take 1 capsule (5,000 Units total) by mouth daily.  30 capsule  0   ??? DULoxetine (CYMBALTA) 60 MG capsule Take 1 capsule (60 mg total) by mouth daily.  30 capsule  5   ??? ferrous sulfate 325 (65 FE) MG tablet Take 1 tablet (325 mg total) by mouth daily with breakfast.  30 tablet  0   ??? fluticasone (FLONASE) 50 mcg/actuation nasal spray Use 2 sprays into each nostril daily.  16 g  5   ??? folic acid-vit B6-vit B12 (FOLBEE) 2.5-25-1 mg Tab Take 1 tablet by mouth daily.  30 each  5   ??? gabapentin (NEURONTIN) 600 MG tablet Take 1 tablet (600 mg total) by mouth 2 times a day.  60 tablet  5   ??? ipratropium (ATROVENT) 0.03 % nasal spray Use 2 sprays into each nostril 2 times a day.  30 mL  3   ??? methylPREDNISolone (MEDROL, PAK,) 4 mg tablet follow package directions  21 Package  0   ??? montelukast (SINGULAIR) 10 mg tablet TAKE ONE TABLET BY MOUTH AT BEDTIME  30 tablet  2   ??? multivitamin Chew Chew by mouth.       ??? raloxifene (EVISTA) 60 mg tablet Take 60 mg by mouth daily.       ??? thyroid, pork, (ARMOUR) 120 mg Tab Take 1 tablet (120 mg total) by mouth daily.  30 tablet  3   ??? TRASTUZUMAB (HERCEPTIN IV) Inject into the vein. Every 3 weeks         No facility-administered encounter medications on file as of 11/13/2013.        Histories  She has a past medical history of Hyperlipidemia; Hypertension; Diabetes mellitus; Hypothyroidism; GERD (gastroesophageal reflux disease); Polyp of stomach; RSD (reflex sympathetic dystrophy); Anxiety; Asthma; and Cancer.    She has past surgical history that includes Anterior cruciate ligament repair; Meniscectomy; Dental surgery; Hysterectomy; and Foot  surgery.    Her family history includes Breast cancer in her paternal grandmother; Cancer in her maternal grandmother; Colon cancer in her maternal grandfather; Diabetes in her father and maternal grandmother; Factor VIII deficiency in her father; Hypertension in her father and mother; Other in her brother and mother; Stroke in her maternal grandmother.    She reports that she has never smoked. She has never used smokeless tobacco. She reports that she drinks alcohol.    Blood pressure 113/47, pulse 79, temperature 97.9 ??F (36.6 ??C), resp. rate 16, weight 195 lb (88.451 kg).  Physical Exam   Constitutional:   Caucasian female, sitting comfortably in clinic, in no acute distress   Neck: Normal range of motion. Neck supple.   Cardiovascular: Normal rate.    Pulmonary/Chest: Effort normal and breath sounds normal.   Some mild fibrosis and minimal enlargement of R breast as compared to L.  No palpable masses or skin changes present bilaterally.   Abdominal: Soft. She exhibits no distension.   Lymphadenopathy:     She has no cervical adenopathy.         Review of Lab Results  Lab Results   Component Value Date    WBC 4.3 07/28/2013    RBC 3.91 07/28/2013    HGB 12.1 07/28/2013    HCT 36.4 07/28/2013    MCV 93.1 07/28/2013    MCH 30.8 07/28/2013    MCHC 33.1 07/28/2013    RDW 13.9 07/28/2013    PLT 176 07/28/2013    MPV 10.9 10/29/2004        Assessment  Ms. Mangus is a pleasant 53 yo F with h/o of stage IA cancer of the R breast now s/p partial mastectomy and sentinel LN biopsy (with bilateral breast reduction) with RT and chemotherapy.  She is doing well without any evidence of disease on today's exam.                     Plan  We will have her return to clinic in 6 months for follow-up.    Attending Addendum  I have seen and examined the patient, reviewed pertinent imaging and discussed the plan with my resident. Nykeria is really doing well. She is active and healthy. She has an excellent cosmetic outcome.  I agree with my  resident's assessment and plan.  Armen Pickup, MD

## 2013-11-14 ENCOUNTER — Encounter: Payer: PRIVATE HEALTH INSURANCE | Attending: Radiation Oncology

## 2013-11-16 ENCOUNTER — Ambulatory Visit: Admit: 2013-11-16 | Discharge: 2013-11-16 | Payer: PRIVATE HEALTH INSURANCE

## 2013-11-16 DIAGNOSIS — E039 Hypothyroidism, unspecified: Secondary | ICD-10-CM

## 2013-11-16 DIAGNOSIS — F419 Anxiety disorder, unspecified: Secondary | ICD-10-CM | POA: Insufficient documentation

## 2013-11-16 DIAGNOSIS — Z9884 Bariatric surgery status: Secondary | ICD-10-CM | POA: Insufficient documentation

## 2013-11-16 DIAGNOSIS — F32A Depression, unspecified: Secondary | ICD-10-CM | POA: Insufficient documentation

## 2013-11-16 MED ORDER — ipratropium (ATROVENT) 0.03 % nasal spray
21 | Freq: Two times a day (BID) | NASAL | 1.00 refills | 30.00000 days | Status: AC
Start: 2013-11-16 — End: 2014-07-26

## 2013-11-16 MED ORDER — folic acid-vit B6-vit B12 (FOLBEE) 2.5-25-1 mg Tab
2.5-25-1 | Freq: Every day | ORAL | 2.00 refills | 30.00000 days | Status: AC
Start: 2013-11-16 — End: 2014-01-24

## 2013-11-16 MED ORDER — DULoxetine (CYMBALTA) 60 MG capsule
60 | ORAL_CAPSULE | Freq: Every day | ORAL | Status: AC
Start: 2013-11-16 — End: 2014-01-24

## 2013-11-16 MED ORDER — albuterol (PROVENTIL HFA;VENTOLIN HFA) 90 mcg/actuation inhaler
90 | Freq: Four times a day (QID) | RESPIRATORY_TRACT | Status: AC | PRN
Start: 2013-11-16 — End: 2016-03-25

## 2013-11-16 MED ORDER — montelukast (SINGULAIR) 10 mg tablet
10 | ORAL_TABLET | Freq: Every evening | ORAL | Status: AC
Start: 2013-11-16 — End: 2014-01-24

## 2013-11-16 MED ORDER — gabapentin (NEURONTIN) 600 MG tablet
600 | ORAL_TABLET | Freq: Three times a day (TID) | ORAL | Status: AC
Start: 2013-11-16 — End: 2014-01-24

## 2013-11-16 MED ORDER — levothyroxine (SYNTHROID) 75 MCG tablet
75 | ORAL_TABLET | Freq: Every day | ORAL | Status: AC
Start: 2013-11-16 — End: 2014-01-24

## 2013-11-16 NOTE — Unmapped (Signed)
Instructed pt to continue current meds

## 2013-11-16 NOTE — Unmapped (Signed)
Subjective  HPI:   Patient ID: Wanda Rowe is a 53 y.o. female.    Chief Complaint:  HPI Comments: F/u thyroid s/p bariatric surg, rsd, anxiety depression has been tired lately 2 more herceptin treatments to do             Medications:  Current Outpatient Prescriptions   Medication Sig Dispense Refill   ??? albuterol (PROVENTIL HFA;VENTOLIN HFA) 90 mcg/actuation inhaler Inhale 1-2 puffs into the lungs every 6 hours as needed. Q4-6H PRN  1 Inhaler  3   ??? aspirin 81 MG EC tablet Take 81 mg by mouth daily.       ??? blood sugar diagnostic (ACCU-CHEK AVIVA PLUS TEST STRP) Strp fsbs qd  100 strip  1   ??? blood-glucose meter (GLUCOSE MONITORING KIT) kit by Other route 2 (two) times daily. Use as instructed - fsbs bid        ??? CALCIUM CITRATE ORAL Take 1 tablet by mouth daily.         ??? cholecalciferol, vitamin D3, 5,000 unit capsule Take 1 capsule (5,000 Units total) by mouth daily.  30 capsule  0   ??? DULoxetine (CYMBALTA) 60 MG capsule Take 1 capsule (60 mg total) by mouth daily.  30 capsule  5   ??? fluticasone (FLONASE) 50 mcg/actuation nasal spray Use 2 sprays into each nostril daily.  16 g  5   ??? folic acid-vit B6-vit B12 (FOLBEE) 2.5-25-1 mg Tab Take 1 tablet by mouth daily.  30 each  5   ??? gabapentin (NEURONTIN) 600 MG tablet Take 1 tablet (600 mg total) by mouth 3 times a day.  90 tablet  5   ??? ipratropium (ATROVENT) 0.03 % nasal spray Use 2 sprays into each nostril 2 times a day.  30 mL  5   ??? montelukast (SINGULAIR) 10 mg tablet Take 1 tablet (10 mg total) by mouth at bedtime.  30 tablet  5   ??? multivitamin Chew Chew by mouth.       ??? raloxifene (EVISTA) 60 mg tablet Take 60 mg by mouth daily.       ??? TRASTUZUMAB (HERCEPTIN IV) Inject into the vein. Every 3 weeks       ??? valACYclovir (VALTREX) 500 MG tablet Take 500 mg by mouth daily.       ??? levothyroxine (SYNTHROID) 75 MCG tablet Take 1 tablet (75 mcg total) by mouth daily.  30 tablet  5     No current facility-administered medications for this visit.        ROS:      Review of Systems   Constitutional: Positive for fatigue.   All other systems reviewed and are negative.         Objective:   Physical Exam   Constitutional: She appears well-developed and well-nourished.   HENT:   Head: Normocephalic and atraumatic.   Cardiovascular: Normal rate, regular rhythm and normal heart sounds.  Exam reveals no gallop and no friction rub.    No murmur heard.  Pulmonary/Chest: Effort normal and breath sounds normal.   Abdominal: Soft. Bowel sounds are normal.   Musculoskeletal: Normal range of motion.   Psychiatric: She has a normal mood and affect. Her behavior is normal.             Filed Vitals:    11/16/13 0919   BP: 120/84   Pulse: 80   Resp: 12   Height: 5' 8 (1.727 m)   Weight: 194 lb (87.998  kg)     Body mass index is 29.5 kg/(m^2).  Body surface area is 2.05 meters squared.                Assessment/Plan:     Patient Active Problem List   Diagnosis   ??? Routine general medical examination at a health care facility   ??? Vitamin D deficiency   ??? RSD lower limb   ??? Breast cancer   ??? Acquired hypothyroidism   ??? Anxiety and depression   ??? H/O bariatric surgery

## 2013-11-16 NOTE — Unmapped (Signed)
Cont vits recheck levels

## 2013-11-16 NOTE — Unmapped (Signed)
Recheck, Instructed pt to continue current meds

## 2013-11-16 NOTE — Unmapped (Signed)
Increase neurontin

## 2013-11-16 NOTE — Unmapped (Signed)
Switch back to synthroid

## 2013-11-28 DIAGNOSIS — R102 Pelvic and perineal pain: Secondary | ICD-10-CM | POA: Insufficient documentation

## 2013-11-28 NOTE — Unmapped (Signed)
[facility]    PATIENT NAME: Wanda Rowe, Wanda Rowe                              MR#: [id number]  DATE OF BIRTH: 03-27-1960                           Account #: [id number]  ADMITTING: Jerrel Ivory T                         ROOM #: 8SDS POOL  ATTENDING: CALIGARIS, JOSEPH T                   NURSING UNIT: C8N  PRIMARY: Johnnye Sandford K                          ADMIT DATE: 11/28/2013  REFERRING: CALIGARIS, JOSEPH T                 DISCHARGE DATE:  DICTATED BY: CALIGARIS, JOSEPH T                               OPERATIVE REPORT    DATE OF OPERATION: 11/28/13    PREOPERATIVE DIAGNOSES:  1.  Pelvic pain.  2.  Pelvic adhesions.  3.  History of breast cancer.    POSTOPERATIVE DIAGNOSES:  1.  Pelvic pain.  2.  Pelvic adhesions.  3.  History of breast cancer.    SURGEON:  Jerrel Ivory, M.D.    ANESTHESIA:  General endotracheal.    PROCEDURES:  Pelvic exam under anesthesia, diagnostic laparoscopy, lysis of  adhesions, bilateral salpingo-oophorectomy.    FINDINGS:  The patient had a previous hysterectomy and both ovaries and tubes  were identified and were mobile.  Both ureters were identified easily and the  fallopian tubes were not dilated.  There was no evidence of endometriosis or  other pathology.  The ovaries themselves was somewhat attached to the pelvic  sidewall over these adhesions were easily taken down.    ESTIMATED BLOOD LOSS:  Minimal.    COMPLICATIONS:  None.    CONDITION:  The patient was taken to the recovery room awake and in excellent  condition.    OPERATIVE REPORT:  The patient was brought to the operating room and general  anesthesia was performed.  The patient was placed in dorsal lithotomy position.  Examination under anesthesia was done.  Noted at this time was no pelvic  pathology.  The patient was then prepped and draped in the usual sterile  fashion.  Foley catheter was placed in the bladder and a sponge stick was placed  in the vagina.    A small incision was made  inferior to the umbilicus and the abdomen was tented  and laparoscopic trocar was thrust in the abdominal cavity.  Trocar was removed  and laparoscope was placed with laparoscopic sleeve with visualization of the  omentum.  CO2 gas was then instilled creating adequate pneumoperitoneum and the  patient was placed in Trendelenburg.  A second puncture was made two  fingerbreadths above symphysis with a 5 mm trocar.  The above findings were  noted.    A third trocar was placed over the right side of the abdomen at the  midclavicular line lateral to the umbilicus.  This  was a 5 mm trocar as well.  A  Babcock clamp was used to grasp the left ovary and elevate it and the ureter was  identified.  The Kleppinger device was brought in position and the  infundibulopelvic ligament on the left side was crossclamped, cauterized and  divided and we went through the ligament and cutting off the blood supply to the  left ovary and tube.  We then dissected with the LigaSure device the ovary and  tube off the rest pelvic sidewall by cross clamping, cauterizing and dividing  removing it completely and dropping and into the pelvis.  Evaluation at this  point showed no bleeding at this site.  The ureter was mobile.    Likewise on the right side, we identified the ovary and elevated the ureter and  cross clamped the infundibulopelvic ligament, cauterizing and dividing and  cutting off the blood supply to likewise we cross clamped head and divided with  the LigaSure device.  The adhesions that filled the remaining portion of the  ovary and tube to the pelvic sidewall and again this was done with good  hemostasis and the ovaries were allowed to fall back into the abdomen.  Evaluation of the site showed no further bleeding Interceed was placed along  both pelvic sidewalls and over the operative site to prevent adhesion formation.    An Endopouch was then placed and this was done twice with one ovary placed then  brought to the umbilicus and  likewise the second ovary and we did this by  placing a 5 mm scope through the lateral port.  Once this was done, the  procedure was felt to be complete and there was no bleeding noted.  All  instruments removed from the abdomen and CO2 gas was allowed to escape.  Incisions were closed using 0 Vicryl suture in the fascia followed by 3-0 Vicryl  subcuticularly and small dressings were placed.  All instruments removed from  the vagina.  The patient having tolerated the procedure well and was taken to  recovery room awake and in excellent condition.      Dict: JOSEPH T. CALIGARIS. MD  Auth: Caroline More. MD  D: 11/28/2013 08:50:27  T: 11/28/2013 10:05:44  Orig. Job# [id number]  Dictation ID: [id number]

## 2013-12-14 DIAGNOSIS — Z09 Encounter for follow-up examination after completed treatment for conditions other than malignant neoplasm: Secondary | ICD-10-CM | POA: Insufficient documentation

## 2014-01-18 LAB — COMPREHENSIVE METABOLIC PANEL
A/G Ratio: 1.6 (calc) (ref 1.0–2.5)
ALT: 21 U/L (ref 6–29)
AST: 25 U/L (ref 10–35)
Albumin: 4.1 g/dL (ref 3.6–5.1)
Alkaline Phosphatase: 65 U/L (ref 33–130)
BUN: 22 mg/dL (ref 7–25)
CO2: 24 mmol/L (ref 19–30)
Calcium: 9.3 mg/dL (ref 8.6–10.4)
Chloride: 108 mmol/L (ref 98–110)
Creatinine: 0.67 mg/dL (ref 0.50–1.05)
GFR MDRD Af Amer: 116 mL/min/{1.73_m2} (ref >=60)
Globulin, Total: 2.6 g/dL (calc) (ref 1.9–3.7)
Glucose: 92 mg/dL (ref 65–99)
Potassium: 4 mmol/L (ref 3.5–5.3)
Sodium: 143 mmol/L (ref 135–146)
Total Bilirubin: 0.3 mg/dL (ref 0.2–1.2)
Total Protein: 6.7 g/dL (ref 6.1–8.1)
eGFR Non-Afr. American: 100 mL/min/{1.73_m2} (ref >=60)

## 2014-01-18 LAB — TRIGLYCERIDES: Triglycerides: 81 mg/dL (ref <150)

## 2014-01-18 LAB — TSH: TSH: 1.18 mIU/L

## 2014-01-18 LAB — T4, FREE: Free T4: 0.9 ng/dL (ref 0.8–1.8)

## 2014-01-18 LAB — CHOLESTEROL, TOTAL: Cholesterol, Total: 171 mg/dL (ref 125–200)

## 2014-01-18 LAB — CBC
Hematocrit: 34.8 % (ref 35.0–45.0)
Hemoglobin: 11.9 g/dL (ref 11.7–15.5)
MCH: 31.3 pg (ref 27.0–33.0)
MCHC: 34.2 g/dL (ref 32.0–36.0)
MCV: 91.7 fL (ref 80.0–100.0)
Platelets: 178 10*3/uL (ref 140–400)
RBC: 3.8 10*6/uL (ref 3.80–5.10)
RDW: 14 % (ref 11.0–15.0)
WBC: 4.2 10*3/uL (ref 3.8–10.8)

## 2014-01-18 LAB — FOLATE: Folic Acid: >24 ng/mL

## 2014-01-18 LAB — VITAMIN B12: Vitamin B-12: 654 pg/mL (ref 200–1100)

## 2014-01-18 LAB — IRON: Iron: 66 ug/dL (ref 45–160)

## 2014-01-18 LAB — NON-HDL CHOLESTEROL: Non HDL Chol. (LDL+VLDL): 101 mg/dL (calc)

## 2014-01-18 LAB — HDL CHOLESTEROL: HDL: 70 mg/dL (ref >=46)

## 2014-01-18 LAB — FERRITIN: Ferritin: 48 ng/mL (ref 10–232)

## 2014-01-18 LAB — HEMOGLOBIN A1C: Hemoglobin A1C: 5.5 % of total Hgb (ref <5.7)

## 2014-01-18 LAB — T3, FREE: T3, Free: 2.5 pg/mL (ref 2.3–4.2)

## 2014-01-18 LAB — VITAMIN D 25 HYDROXY: Vit D, 25-Hydroxy: 29 ng/mL (ref 30–100)

## 2014-01-18 LAB — LDL-CHOLESTEROL: LDL Calculated: 85 mg/dL (calc) (ref <130)

## 2014-01-18 LAB — CHOL/HDLC RATIO: Chol/HDL Ratio: 2.4 (calc) (ref <=5.0)

## 2014-01-18 LAB — DHEA-SULFATE: DHEA Sulfate: 52 ug/dL (ref 8–188)

## 2014-01-24 ENCOUNTER — Ambulatory Visit: Admit: 2014-01-24 | Discharge: 2014-01-24 | Payer: PRIVATE HEALTH INSURANCE

## 2014-01-24 DIAGNOSIS — F32A Depression, unspecified: Secondary | ICD-10-CM

## 2014-01-24 MED ORDER — ergocalciferol (ERGOCALCIFEROL) 50,000 unit capsule
1250 | ORAL_CAPSULE | ORAL | Status: AC
Start: 2014-01-24 — End: 2014-07-26

## 2014-01-24 MED ORDER — folic acid-vit B6-vit B12 (FOLBEE) 2.5-25-1 mg Tab
2.5-25-1 | Freq: Every day | ORAL | 2.00 refills | 30.00000 days | Status: AC
Start: 2014-01-24 — End: 2014-07-26

## 2014-01-24 MED ORDER — gabapentin (NEURONTIN) 600 MG tablet
600 | ORAL_TABLET | Freq: Three times a day (TID) | ORAL | Status: AC
Start: 2014-01-24 — End: 2014-07-26

## 2014-01-24 MED ORDER — levothyroxine (SYNTHROID) 75 MCG tablet
75 | ORAL_TABLET | Freq: Every day | ORAL | Status: AC
Start: 2014-01-24 — End: 2014-07-26

## 2014-01-24 MED ORDER — montelukast (SINGULAIR) 10 mg tablet
10 | ORAL_TABLET | Freq: Every evening | ORAL | Status: AC
Start: 2014-01-24 — End: 2014-07-26

## 2014-01-24 MED ORDER — DULoxetine (CYMBALTA) 60 MG capsule
60 | ORAL_CAPSULE | Freq: Every day | ORAL | Status: AC
Start: 2014-01-24 — End: 2014-07-07

## 2014-01-24 MED ORDER — valACYclovir (VALTREX) 500 MG tablet
500 | ORAL_TABLET | Freq: Every day | ORAL | Status: AC
Start: 2014-01-24 — End: 2014-11-22

## 2014-01-24 NOTE — Unmapped (Signed)
Restart iron and folbe

## 2014-01-24 NOTE — Unmapped (Signed)
Instructed pt to continue current meds

## 2014-01-24 NOTE — Unmapped (Addendum)
Instructed pt to continue current meds  Sit/stand desk

## 2014-01-24 NOTE — Unmapped (Signed)
Restart d

## 2014-01-24 NOTE — Unmapped (Signed)
Subjective  HPI:   Patient ID: Wanda Rowe is a 53 y.o. female.    Chief Complaint:  HPI Comments: F/u thyroid, vit d, b12, iron s/p bariatric surgery, chol glucose, rsd new job drives to [place] 3 days a week makes leg hurt more. Not good about taking vit             Medications:  Current Outpatient Prescriptions   Medication Sig Dispense Refill   ??? albuterol (PROVENTIL HFA;VENTOLIN HFA) 90 mcg/actuation inhaler Inhale 1-2 puffs into the lungs every 6 hours as needed. Q4-6H PRN 1 Inhaler 3   ??? aspirin 81 MG EC tablet Take 81 mg by mouth daily.     ??? blood sugar diagnostic (ACCU-CHEK AVIVA PLUS TEST STRP) Strp fsbs qd 100 strip 1   ??? blood-glucose meter (GLUCOSE MONITORING KIT) kit by Other route 2 (two) times daily. Use as instructed - fsbs bid      ??? CALCIUM CITRATE ORAL Take 1 tablet by mouth daily.       ??? DULoxetine (CYMBALTA) 60 MG capsule Take 1 capsule (60 mg total) by mouth daily. 30 capsule 5   ??? ergocalciferol (ERGOCALCIFEROL) 50,000 unit capsule Take 1 capsule (50,000 Units total) by mouth once a week. 4 capsule 5   ??? ferrous sulfate 325 (65 FE) MG tablet Take by mouth.     ??? fluticasone (FLONASE) 50 mcg/actuation nasal spray Use 2 sprays into each nostril daily. 16 g 5   ??? folic acid-vit B6-vit B12 (FOLBEE) 2.5-25-1 mg Tab Take 1 tablet by mouth daily. 30 each 5   ??? gabapentin (NEURONTIN) 600 MG tablet Take 1 tablet (600 mg total) by mouth 3 times a day. 90 tablet 5   ??? ipratropium (ATROVENT) 0.03 % nasal spray Use 2 sprays into each nostril 2 times a day. 30 mL 5   ??? levothyroxine (SYNTHROID) 75 MCG tablet Take 1 tablet (75 mcg total) by mouth daily. 30 tablet 5   ??? montelukast (SINGULAIR) 10 mg tablet Take 1 tablet (10 mg total) by mouth at bedtime. 30 tablet 5   ??? multivitamin Chew Chew by mouth.     ??? raloxifene (EVISTA) 60 mg tablet Take 60 mg by mouth daily.     ??? valACYclovir (VALTREX) 500 MG tablet Take 1 tablet (500 mg total) by mouth daily. 30 tablet 5     No current facility-administered  medications for this visit.        ROS:   Review of Systems   Constitutional: Negative for fatigue.   All other systems reviewed and are negative.         Objective:   Physical Exam   Constitutional: She appears well-developed and well-nourished.   HENT:   Head: Normocephalic and atraumatic.   Cardiovascular: Normal rate, regular rhythm and normal heart sounds.  Exam reveals no gallop and no friction rub.    No murmur heard.  Pulmonary/Chest: Effort normal and breath sounds normal.   Abdominal: Soft. Bowel sounds are normal.   Musculoskeletal: Normal range of motion.   Psychiatric: She has a normal mood and affect. Her behavior is normal.             Filed Vitals:    01/24/14 0859   BP: 120/84   Pulse: 71   Resp: 12   Height: 5' 8 (1.727 m)   Weight: 206 lb (93.441 kg)     Body mass index is 31.33 kg/(m^2).  Body surface area is 2.12 meters squared.  Assessment/Plan:     Patient Active Problem List   Diagnosis   ??? Routine general medical examination at a health care facility   ??? Vitamin D deficiency   ??? RSD lower limb   ??? Breast cancer   ??? Acquired hypothyroidism   ??? Anxiety and depression   ??? H/O bariatric surgery   ??? Personal history of breast cancer   ??? HSV-1 (herpes simplex virus 1) infection

## 2014-02-02 HISTORY — PX: LAPAROSCOPIC OOPHERECTOMY: SHX6507

## 2014-05-07 NOTE — Unmapped (Signed)
Care Coordinator sent letter and brochure for  invite into the Primary Care Health Management Program. Will contact pt once letter is received to extend invite into program. There are no there needs to be met at this time           Garwood Wentzell-Care Coordinator

## 2014-05-14 ENCOUNTER — Encounter: Payer: PRIVATE HEALTH INSURANCE | Attending: Radiation Oncology

## 2014-05-15 NOTE — Unmapped (Signed)
Care Coordinator called pt to extend invite into the Primary Care Health Management Program. Pt was not available. Left voicemail message for pt to return call. There are no other needs to be met at this time. Will contact pt on 05/17/2014 if call is not returned

## 2014-05-23 ENCOUNTER — Encounter: Payer: PRIVATE HEALTH INSURANCE | Attending: Radiation Oncology

## 2014-05-28 NOTE — Unmapped (Signed)
Care Coordinator called pt to extend invite into the Primary Care Health Management Program. Pt was not available. Left voicemail message for pt to return call. There are no other needs to be met at this time. Will contact pt on 05/29/2014 if call is not returned. There are no other needs to be met at this time.                     [facility] [employer]

## 2014-06-06 ENCOUNTER — Ambulatory Visit: Admit: 2014-06-06 | Discharge: 2014-06-06 | Payer: PRIVATE HEALTH INSURANCE | Attending: Radiation Oncology

## 2014-06-06 DIAGNOSIS — C50911 Malignant neoplasm of unspecified site of right female breast: Secondary | ICD-10-CM

## 2014-06-06 NOTE — Unmapped (Signed)
Chief Complaint   Patient presents with   ??? Follow-up       Diagnosis  Breast cancer    Primary site: Breast (Right)    Staging method: AJCC 7th Edition    Clinical: Stage IA (T1b, N0, cM0) - Signed by Andree Coss, MD on 02/27/2013    Pathologic free text: Stage    Pathologic: Stage Unknown (TX, N0(i-)) - Signed by Andree Coss, MD on 02/27/2013    Summary: Stage Unknown (TX, N0(i-), cM0)    Previous Treatments  12/26/2012 - Neoadjuvant TCHP ??  02/09/2013 - Bilateral breast reduction with right breast partial mastectomy and sentinal lymph node biopsy ??  04/18/2013 - Adjuvant right breast whole breast radiotherapy 42.56Gy in 16Fx without boost due to lack of seroma target     History of Present Illness  Wanda Frankowski returns to the radiation oncology clinic for her routine follow up visit. She reports that she continues to have some lymphedema in her breast which is associated with some minor soreness. She performs her physical therapy when she remembers to do so. Recently she has had a productive cough (yellow sputum), low grade fever, fatigue, and malaise. She visited with an urgent care clinic who placed her on doxycycline, steroids, and an inhaler. She has been using these for the past 2-3 days and has had significant improvement.       Review of Systems   Constitutional: Positive for weight gain. Negative for fever, weight loss, appetite change and fatigue.        Some minor breast soreness   HENT: Negative for congestion, sore throat and trouble swallowing.    Eyes: Negative for visual disturbance.   Respiratory: Negative for cough and shortness of breath.    Cardiovascular: Negative for chest pain.   Gastrointestinal: Negative for nausea, vomiting, abdominal pain, diarrhea, constipation and blood in stool.   Genitourinary: Negative for dysuria, frequency, hematuria, vaginal bleeding, vaginal discharge, difficulty urinating, menstrual problem, pelvic pain and dyspareunia.   Musculoskeletal: Negative for back pain  and arthralgias.   Skin: Negative for rash.   Neurological: Negative for seizures, weakness, numbness and headaches.   Hematological: Negative for adenopathy.   Psychiatric/Behavioral: Negative for depression.   All other systems reviewed and are negative.      Allergies  Succinylcholine; Zithromax; Silver; Sulfa (sulfonamide antibiotics); and Sulfanilamide    Medications  Outpatient Encounter Prescriptions as of 06/06/2014   Medication Sig Dispense Refill   ??? albuterol (PROVENTIL HFA;VENTOLIN HFA) 90 mcg/actuation inhaler Inhale 1-2 puffs into the lungs every 6 hours as needed. Q4-6H PRN 1 Inhaler 3   ??? aspirin 81 MG EC tablet Take 81 mg by mouth daily.     ??? blood sugar diagnostic (ACCU-CHEK AVIVA PLUS TEST STRP) Strp fsbs qd 100 strip 1   ??? blood-glucose meter (GLUCOSE MONITORING KIT) kit by Other route 2 (two) times daily. Use as instructed - fsbs bid      ??? CALCIUM CITRATE ORAL Take 1 tablet by mouth daily.       ??? DULoxetine (CYMBALTA) 60 MG capsule Take 1 capsule (60 mg total) by mouth daily. 30 capsule 5   ??? ergocalciferol (ERGOCALCIFEROL) 50,000 unit capsule Take 1 capsule (50,000 Units total) by mouth once a week. 4 capsule 5   ??? ferrous sulfate 325 (65 FE) MG tablet Take by mouth.     ??? fluticasone (FLONASE) 50 mcg/actuation nasal spray Use 2 sprays into each nostril daily. 16 g 5   ??? folic acid-vit  B6-vit B12 (FOLBEE) 2.5-25-1 mg Tab Take 1 tablet by mouth daily. 30 each 5   ??? gabapentin (NEURONTIN) 600 MG tablet Take 1 tablet (600 mg total) by mouth 3 times a day. 90 tablet 5   ??? ipratropium (ATROVENT) 0.03 % nasal spray Use 2 sprays into each nostril 2 times a day. 30 mL 5   ??? levothyroxine (SYNTHROID) 75 MCG tablet Take 1 tablet (75 mcg total) by mouth daily. 30 tablet 5   ??? montelukast (SINGULAIR) 10 mg tablet Take 1 tablet (10 mg total) by mouth at bedtime. 30 tablet 5   ??? multivitamin Chew Chew by mouth.     ??? raloxifene (EVISTA) 60 mg tablet Take 60 mg by mouth daily.     ??? valACYclovir (VALTREX)  500 MG tablet Take 1 tablet (500 mg total) by mouth daily. 30 tablet 5     No facility-administered encounter medications on file as of 06/06/2014.        Histories  She has a past medical history of Hyperlipidemia; Hypertension; Diabetes mellitus; Hypothyroidism; GERD (gastroesophageal reflux disease); Polyp of stomach; RSD (reflex sympathetic dystrophy); Anxiety; Asthma; and Cancer.    She has past surgical history that includes Anterior cruciate ligament repair; Meniscectomy; Dental surgery; Hysterectomy; and Foot surgery.    Her family history includes Breast cancer in her paternal grandmother; Cancer in her maternal grandmother; Colon cancer in her maternal grandfather; Diabetes in her father and maternal grandmother; Factor VIII deficiency in her father; Hypertension in her father and mother; Other in her brother and mother; Stroke in her maternal grandmother.    She reports that she has never smoked. She has never used smokeless tobacco. She reports that she drinks alcohol.        Blood pressure 126/70, pulse 72, temperature 98.4 ??F (36.9 ??C), resp. rate 16, weight 208 lb (94.348 kg).  Physical Exam   Constitutional: She is oriented to person, place, and time. She appears well-developed and well-nourished.   HENT:   Head: Normocephalic.   Mouth/Throat: No oropharyngeal exudate.   Eyes: Conjunctivae are normal. No scleral icterus.   Cardiovascular: Normal rate.    Pulmonary/Chest: Effort normal and breath sounds normal. She has no wheezes. She has no rales.   Some mild fibrosis and minimal enlargement of R breast as compared to L.?? No palpable masses or skin changes present bilaterally. Negative axillary nodes bilaterally   Abdominal: Soft.   Musculoskeletal: Normal range of motion. She exhibits no tenderness.   Lymphadenopathy:     She has no cervical adenopathy.   Neurological: She is alert and oriented to person, place, and time.   Skin: Skin is warm and dry.   Psychiatric: She has a normal mood and affect.  Her behavior is normal.          Review of Lab Results  Lab Results   Component Value Date    WBC 4.2 01/18/2014    RBC 3.80 01/18/2014    HGB 11.9 01/18/2014    HCT 34.8* 01/18/2014    MCV 91.7 01/18/2014    MCH 31.3 01/18/2014    MCHC 34.2 01/18/2014    RDW 14.0 01/18/2014    PLT 178 01/18/2014    MPV 10.9 10/29/2004          Assessment  Wanda Rowe is a 54 year old lady with a Stage IA left breast cancer who is status post chemotherapy, partial mastectomy, and whole breast radiation therapy (no seroma boost). She is doing very well  clinically in relation to her breast cancer. Her pulmonary infection appears to be resolving, as her clinical exam was normal.                      Plan  We will follow up with her in 6 months. She will continue to follow with medical oncology and surgical oncology.      Attending Addendum  I have seen and examined the patient, reviewed pertinent imaging and discussed the plan with my resident. Wanda Michaelson is doing very well. She has excellent cosmesis. She has a mild URI at this time.  I agree with my resident's assessment and plan.  Armen Pickup, MD

## 2014-07-09 MED ORDER — DULoxetine (CYMBALTA) 60 MG capsule
60 | ORAL_CAPSULE | ORAL | Status: AC
Start: 2014-07-09 — End: 2014-07-26

## 2014-07-18 NOTE — Unmapped (Signed)
done

## 2014-07-18 NOTE — Unmapped (Signed)
Can [facility] fax lab orders to [facility] this building.

## 2014-07-21 LAB — COMPREHENSIVE METABOLIC PANEL
A/G Ratio: 1.6 (calc) (ref 1.0–2.5)
ALT: 13 U/L (ref 6–29)
AST: 19 U/L (ref 10–35)
Albumin: 4.1 g/dL (ref 3.6–5.1)
Alkaline Phosphatase: 62 U/L (ref 33–130)
BUN: 16 mg/dL (ref 7–25)
CO2: 24 mmol/L (ref 19–30)
Calcium: 9.3 mg/dL (ref 8.6–10.4)
Chloride: 107 mmol/L (ref 98–110)
Creatinine: 0.65 mg/dL (ref 0.50–1.05)
GFR MDRD Af Amer: 117 mL/min/1.73m2 (ref >=60)
Globulin, Total: 2.6 g/dL (ref 1.9–3.7)
Glucose: 98 mg/dL (ref 65–99)
Potassium: 4.1 mmol/L (ref 3.5–5.3)
Sodium: 140 mmol/L (ref 135–146)
Total Bilirubin: 0.6 mg/dL (ref 0.2–1.2)
Total Protein: 6.7 g/dL (ref 6.1–8.1)
eGFR Non-Afr. American: 101 mL/min/1.73m2 (ref >=60)

## 2014-07-21 LAB — CBC
Hematocrit: 39 % (ref 35.0–45.0)
Hemoglobin: 12.8 g/dL (ref 11.7–15.5)
MCH: 30.4 pg (ref 27.0–33.0)
MCHC: 32.8 g/dL (ref 32.0–36.0)
MCV: 92.9 fL (ref 80.0–100.0)
MPV: 8.2 fL (ref 7.5–11.5)
Platelets: 189 Thousand/uL (ref 140–400)
RBC: 4.2 Million/uL (ref 3.80–5.10)
RDW: 14.4 % (ref 11.0–15.0)
WBC: 4.5 Thousand/uL (ref 3.8–10.8)

## 2014-07-21 LAB — VITAMIN D 25 HYDROXY: Vit D, 25-Hydroxy: 25 ng/mL — ABNORMAL LOW (ref 30–100)

## 2014-07-21 LAB — NON-HDL CHOLESTEROL: Non HDL Chol. (LDL+VLDL): 104 mg/dL (calc)

## 2014-07-21 LAB — TSH: TSH: 1.32 m[IU]/L

## 2014-07-21 LAB — HDL CHOLESTEROL: HDL: 70 mg/dL (ref >=46)

## 2014-07-21 LAB — IRON: Iron: 99 ug/dL (ref 45–160)

## 2014-07-21 LAB — VITAMIN B12: Vitamin B-12: 399 pg/mL (ref 200–1100)

## 2014-07-21 LAB — CHOLESTEROL, TOTAL: Cholesterol, Total: 174 mg/dL (ref 125–200)

## 2014-07-21 LAB — TRIGLYCERIDES: Triglycerides: 76 mg/dL (ref <150)

## 2014-07-21 LAB — MAGNESIUM: Magnesium: 2.1 mg/dL (ref 1.5–2.5)

## 2014-07-21 LAB — FOLATE: Folic Acid: 12.9 ng/mL

## 2014-07-21 LAB — LDL-CHOLESTEROL: LDL Calculated: 89 mg/dL (calc) (ref <130)

## 2014-07-21 LAB — FERRITIN: Ferritin: 36 ng/mL (ref 10–232)

## 2014-07-21 LAB — CHOL/HDLC RATIO: Chol/HDL Ratio: 2.5 (calc) (ref <=5.0)

## 2014-07-26 ENCOUNTER — Ambulatory Visit: Admit: 2014-07-26 | Discharge: 2014-07-26 | Payer: PRIVATE HEALTH INSURANCE

## 2014-07-26 DIAGNOSIS — F32A Depression, unspecified: Secondary | ICD-10-CM

## 2014-07-26 MED ORDER — ergocalciferol (ERGOCALCIFEROL) 50,000 unit capsule
1250 | ORAL_CAPSULE | ORAL | Status: AC
Start: 2014-07-26 — End: 2014-11-22

## 2014-07-26 MED ORDER — folic acid-vit B6-vit B12 (FOLBEE) 2.5-25-1 mg Tab
2.5-25-1 | Freq: Every day | ORAL | 2.00 refills | 30.00000 days | Status: AC
Start: 2014-07-26 — End: 2014-11-22

## 2014-07-26 MED ORDER — levothyroxine (SYNTHROID) 75 MCG tablet
75 | ORAL_TABLET | Freq: Every day | ORAL | Status: AC
Start: 2014-07-26 — End: 2014-11-22

## 2014-07-26 MED ORDER — ipratropium (ATROVENT) 0.03 % nasal spray
21 | Freq: Two times a day (BID) | NASAL | 1.00 refills | 30.00000 days | Status: AC
Start: 2014-07-26 — End: 2016-04-22

## 2014-07-26 MED ORDER — DULoxetine (CYMBALTA) 30 MG capsule
30 | ORAL_CAPSULE | Freq: Every day | ORAL | Status: AC
Start: 2014-07-26 — End: 2014-11-22

## 2014-07-26 MED ORDER — conjugated estrogens (PREMARIN) vaginal cream
0.625 | VAGINAL | Status: AC
Start: 2014-07-26 — End: 2016-09-22

## 2014-07-26 MED ORDER — gabapentin (NEURONTIN) 600 MG tablet
600 | ORAL_TABLET | Freq: Three times a day (TID) | ORAL | Status: AC
Start: 2014-07-26 — End: 2014-11-22

## 2014-07-26 NOTE — Unmapped (Signed)
Screening reviewed ordered check hep c boyfriend carrier

## 2014-07-26 NOTE — Unmapped (Signed)
restart

## 2014-07-26 NOTE — Unmapped (Addendum)
Wean cymbalta 30/60 qod then slowly off 60

## 2014-07-26 NOTE — Unmapped (Signed)
Monitor vit

## 2014-07-26 NOTE — Unmapped (Signed)
Subjective  HPI:   Patient ID: Wanda Rowe is a 54 y.o. female.    Chief Complaint:  HPI Comments: Annual wellness . F/u rsd, fibromyalgia depression vit def right knee pain better with increase neurontin would like to d/c cymbalta tried to stop got myalgia dizzy             Medications:  Current Outpatient Prescriptions   Medication Sig Dispense Refill   ??? albuterol (PROVENTIL HFA;VENTOLIN HFA) 90 mcg/actuation inhaler Inhale 1-2 puffs into the lungs every 6 hours as needed. Q4-6H PRN 1 Inhaler 3   ??? aspirin 81 MG EC tablet Take 81 mg by mouth daily.     ??? blood sugar diagnostic (ACCU-CHEK AVIVA PLUS TEST STRP) Strp fsbs qd 100 strip 1   ??? blood-glucose meter (GLUCOSE MONITORING KIT) kit by Other route 2 (two) times daily. Use as instructed - fsbs bid      ??? CALCIUM CITRATE ORAL Take 1 tablet by mouth daily.       ??? DULoxetine (CYMBALTA) 60 MG capsule TAKE ONE CAPSULE BY MOUTH DAILY 30 capsule 0   ??? ergocalciferol (ERGOCALCIFEROL) 50,000 unit capsule Take 1 capsule (50,000 Units total) by mouth once a week. 4 capsule 5   ??? fluticasone (FLONASE) 50 mcg/actuation nasal spray Use 2 sprays into each nostril daily. 16 g 5   ??? folic acid-vit B6-vit B12 (FOLBEE) 2.5-25-1 mg Tab Take 1 tablet by mouth daily. 30 each 5   ??? gabapentin (NEURONTIN) 600 MG tablet Take 1 tablet (600 mg total) by mouth 3 times a day. 90 tablet 5   ??? ipratropium (ATROVENT) 0.03 % nasal spray Use 2 sprays into each nostril 2 times a day. 30 mL 5   ??? LACTOBACILLUS ACIDOPHILUS (PROBIOTIC ORAL) Take 1 capsule by mouth daily.     ??? levothyroxine (SYNTHROID) 75 MCG tablet Take 1 tablet (75 mcg total) by mouth daily. 30 tablet 5   ??? raloxifene (EVISTA) 60 mg tablet Take 60 mg by mouth daily.     ??? valACYclovir (VALTREX) 500 MG tablet Take 1 tablet (500 mg total) by mouth daily. 30 tablet 5     No current facility-administered medications for this visit.        ROS:   Review of Systems   Constitutional: Negative for fatigue.   All other systems reviewed  and are negative.         Objective:   Physical Exam   Constitutional: She is oriented to person, place, and time. She appears well-developed and well-nourished.   HENT:   Head: Normocephalic and atraumatic.   Right Ear: External ear normal.   Left Ear: External ear normal.   Nose: Nose normal.   Mouth/Throat: Oropharynx is clear and moist.   Eyes: Conjunctivae and EOM are normal. Pupils are equal, round, and reactive to light.   Neck: Normal range of motion. Neck supple. No thyromegaly present.   Cardiovascular: Normal rate, regular rhythm, normal heart sounds and intact distal pulses.  Exam reveals no gallop and no friction rub.    No murmur heard.  Pulmonary/Chest: Effort normal and breath sounds normal.   Abdominal: Soft. Bowel sounds are normal. She exhibits no distension and no mass. There is no tenderness.   Musculoskeletal: Normal range of motion. She exhibits no edema.   Neurological: She is alert and oriented to person, place, and time. She has normal reflexes.   Skin: Skin is warm and dry. No rash noted.   Psychiatric: She has  a normal mood and affect. Her behavior is normal.             Filed Vitals:    07/26/14 0843   BP: 128/82   Pulse: 72   Resp: 12   Height: 5' 8 (1.727 m)   Weight: 210 lb (95.255 kg)     Body mass index is 31.94 kg/(m^2).  Body surface area is 2.14 meters squared.                Assessment/Plan:     Patient Active Problem List   Diagnosis   ??? Routine general medical examination at a health care facility   ??? Vitamin D deficiency   ??? RSD lower limb   ??? Breast cancer   ??? Acquired hypothyroidism   ??? Anxiety and depression   ??? H/O bariatric surgery   ??? Personal history of breast cancer   ??? HSV-1 (herpes simplex virus 1) infection

## 2014-07-26 NOTE — Unmapped (Signed)
Instructed pt to continue current meds

## 2014-07-26 NOTE — Unmapped (Signed)
neurontin

## 2014-07-26 NOTE — Unmapped (Signed)
evista

## 2014-11-14 LAB — AMBIG ABBREV LP DEFAULT

## 2014-11-14 LAB — COMPREHENSIVE METABOLIC PANEL
A/G Ratio: 1.6 (ref 1.1–2.5)
ALT: 15 IU/L (ref 0–32)
AST: 20 IU/L (ref 0–40)
Albumin: 4.4 g/dL (ref 3.5–5.5)
Alkaline Phosphatase: 67 IU/L (ref 39–117)
BUN/Creatinine Ratio: 23 (ref 9–23)
BUN: 13 mg/dL (ref 6–24)
CO2: 24 mmol/L (ref 18–29)
Calcium: 9.6 mg/dL (ref 8.7–10.2)
Chloride: 100 mmol/L (ref 97–108)
Creatinine: 0.56 mg/dL (ref 0.57–1.00)
GFR MDRD Af Amer: 123 mL/min/{1.73_m2} (ref >59)
Globulin, Total: 2.7 g/dL (ref 1.5–4.5)
Glucose: 90 mg/dL (ref 65–99)
Potassium: 4 mmol/L (ref 3.5–5.2)
Sodium: 141 mmol/L (ref 134–144)
Total Bilirubin: 0.6 mg/dL (ref 0.0–1.2)
Total Protein: 7.1 g/dL (ref 6.0–8.5)
eGFR Non-Afr. American: 107 mL/min/{1.73_m2} (ref >59)

## 2014-11-14 LAB — CBC/DIFF AMBIGUOUS DEFAULT
Basophils Absolute: 0 10*3/uL (ref 0.0–0.2)
Basophils Relative: 0 %
Eosinophils Absolute: 0.1 10*3/uL (ref 0.0–0.4)
Eosinophils Relative: 1 %
Hematocrit: 39.2 % (ref 34.0–46.6)
Hemoglobin: 13 g/dL (ref 11.1–15.9)
Immature Granulocytes Absolute: 0 10*3/uL (ref 0.0–0.1)
Immature Granulocytes: 0 %
Lymphocytes Absolute: 2.4 10*3/uL (ref 0.7–3.1)
Lymphocytes Relative: 36 %
MCH: 29.4 pg (ref 26.6–33.0)
MCHC: 33.2 g/dL (ref 31.5–35.7)
MCV: 89 fL (ref 79–97)
Monocytes Absolute: 0.4 10*3/uL (ref 0.1–0.9)
Monocytes Relative: 5 %
Neutrophils Absolute: 3.9 10*3/uL (ref 1.4–7.0)
Neutrophils Relative: 58 %
Platelets: 225 10*3/uL (ref 150–379)
RBC: 4.42 x10E6/uL (ref 3.77–5.28)
RDW: 13.6 % (ref 12.3–15.4)
WBC: 6.8 10*3/uL (ref 3.4–10.8)

## 2014-11-14 LAB — LIPID PANEL
Cholesterol, Total: 205 mg/dL — ABNORMAL HIGH (ref 100–199)
HDL: 74 mg/dL (ref >39)
LDL Calculated: 101 mg/dL — ABNORMAL HIGH (ref 0–99)
Triglycerides: 148 mg/dL (ref 0–149)
VLDL Cholesterol Cal: 30 mg/dL (ref 5–40)

## 2014-11-14 LAB — FOLATE: Folic Acid: 13.8 ng/mL (ref >3.0)

## 2014-11-14 LAB — TSH: TSH: 1.53 u[IU]/mL (ref 0.450–4.500)

## 2014-11-14 LAB — FERRITIN: Ferritin: 63 ng/mL (ref 15–150)

## 2014-11-14 LAB — VITAMIN D 25 HYDROXY: Vit D, 25-Hydroxy: 25.9 ng/mL — ABNORMAL LOW (ref 30.0–100.0)

## 2014-11-14 LAB — MAGNESIUM: Magnesium: 2 mg/dL (ref 1.6–2.3)

## 2014-11-14 LAB — IRON: Iron: 107 ug/dL (ref 27–159)

## 2014-11-14 LAB — VITAMIN B12: Vitamin B-12: 517 pg/mL (ref 211–946)

## 2014-11-22 ENCOUNTER — Ambulatory Visit: Admit: 2014-11-22 | Discharge: 2014-11-22 | Payer: PRIVATE HEALTH INSURANCE

## 2014-11-22 DIAGNOSIS — F32A Depression, unspecified: Secondary | ICD-10-CM

## 2014-11-22 LAB — HEPATITIS C ANTIBODY
Hepatitis C Antibody: NONREACTIVE
Signal/Cutoff: 0.03 (ref <1.00)

## 2014-11-22 MED ORDER — levothyroxine (SYNTHROID) 75 MCG tablet
75 | ORAL_TABLET | Freq: Every day | ORAL | Status: AC
Start: 2014-11-22 — End: 2015-02-26

## 2014-11-22 MED ORDER — DULoxetine (CYMBALTA) 30 MG capsule
30 | ORAL_CAPSULE | Freq: Every day | ORAL | Status: AC
Start: 2014-11-22 — End: 2014-11-22

## 2014-11-22 MED ORDER — ergocalciferol (ERGOCALCIFEROL) 50,000 unit capsule
1250 | ORAL_CAPSULE | ORAL | Status: AC
Start: 2014-11-22 — End: 2015-02-26

## 2014-11-22 MED ORDER — FLUoxetine (PROZAC) 20 MG tablet
20 | ORAL_TABLET | Freq: Every day | ORAL | Status: AC
Start: 2014-11-22 — End: 2014-11-30

## 2014-11-22 MED ORDER — gabapentin (NEURONTIN) 600 MG tablet
600 | ORAL_TABLET | Freq: Three times a day (TID) | ORAL | Status: AC
Start: 2014-11-22 — End: 2015-02-26

## 2014-11-22 MED ORDER — iron aspgly,ps-C-B12-FA-Ca-suc (FERREX 150 FORTE PLUS) 150-60-25-1 mg-mg-mcg-mg Cap
150-60-25-1 | ORAL_CAPSULE | Freq: Every day | ORAL | 2.00 refills | 30.00000 days | Status: AC
Start: 2014-11-22 — End: 2014-11-30

## 2014-11-22 MED ORDER — escitalopram oxalate (LEXAPRO) 10 MG tablet
10 | ORAL_TABLET | Freq: Every day | ORAL | 1.00 refills | 30.00000 days | Status: AC
Start: 2014-11-22 — End: 2014-11-22

## 2014-11-22 NOTE — Unmapped (Signed)
Instructed pt to continue current meds

## 2014-11-22 NOTE — Unmapped (Signed)
prozac 

## 2014-11-22 NOTE — Unmapped (Signed)
Cont supplements

## 2014-11-22 NOTE — Unmapped (Signed)
Subjective  HPI:   Patient ID: HIYAB NHEM is a 54 y.o. female.    Chief Complaint:  HPI Comments: F/u obesity gaining weight a/p bariatric surg feeling more anxiety thyroid    Hyperlipidemia    Hypertension               Medications:  Current Outpatient Prescriptions   Medication Sig Dispense Refill   ??? albuterol (PROVENTIL HFA;VENTOLIN HFA) 90 mcg/actuation inhaler Inhale 1-2 puffs into the lungs every 6 hours as needed. Q4-6H PRN 1 Inhaler 3   ??? aspirin 81 MG EC tablet Take 81 mg by mouth daily.     ??? blood sugar diagnostic (ACCU-CHEK AVIVA PLUS TEST STRP) Strp fsbs qd 100 strip 1   ??? blood-glucose meter (GLUCOSE MONITORING KIT) kit by Other route 2 (two) times daily. Use as instructed - fsbs bid      ??? CALCIUM CITRATE ORAL Take 1 tablet by mouth daily.       ??? conjugated estrogens (PREMARIN) vaginal cream twicwe a week 42.5 g 0   ??? ergocalciferol (ERGOCALCIFEROL) 50,000 unit capsule Take 1 capsule (50,000 Units total) by mouth once a week. 12 capsule 1   ??? fluticasone (FLONASE) 50 mcg/actuation nasal spray Use 2 sprays into each nostril daily. 16 g 5   ??? gabapentin (NEURONTIN) 600 MG tablet Take 1 tablet (600 mg total) by mouth 3 times a day. 270 tablet 1   ??? ipratropium (ATROVENT) 0.03 % nasal spray Use 2 sprays into each nostril 2 times a day. 30 mL 5   ??? LACTOBACILLUS ACIDOPHILUS (PROBIOTIC ORAL) Take 1 capsule by mouth daily.     ??? levothyroxine (SYNTHROID) 75 MCG tablet Take 1 tablet (75 mcg total) by mouth daily. 90 tablet 1   ??? raloxifene (EVISTA) 60 mg tablet Take 60 mg by mouth daily.     ??? FLUoxetine (PROZAC) 20 MG tablet Take 1 tablet (20 mg total) by mouth daily. 90 tablet 1   ??? iron aspgly,ps-C-B12-FA-Ca-suc (FERREX 150 FORTE PLUS) 150-60-25-1 mg-mg-mcg-mg Cap Take 1 capsule by mouth daily. 90 capsule 1     No current facility-administered medications for this visit.        ROS:   Review of Systems   Constitutional: Negative for fatigue.   All other systems reviewed and are negative.          Objective:   Physical Exam   Constitutional: She appears well-developed and well-nourished.   HENT:   Head: Normocephalic and atraumatic.   Cardiovascular: Normal rate, regular rhythm and normal heart sounds.  Exam reveals no gallop and no friction rub.    No murmur heard.  Pulmonary/Chest: Effort normal and breath sounds normal.   Abdominal: Soft. Bowel sounds are normal.   Musculoskeletal: Normal range of motion.   Psychiatric: She has a normal mood and affect. Her behavior is normal.             Filed Vitals:    11/22/14 0756   BP: 124/82   Pulse: 72   Resp: 12   Height: 5' 8 (1.727 m)   Weight: 221 lb (100.245 kg)     Body mass index is 33.61 kg/(m^2).  Body surface area is 2.19 meters squared.                Assessment/Plan:     Patient Active Problem List   Diagnosis   ??? Routine general medical examination at a health care facility   ??? Vitamin D deficiency   ???  RSD lower limb   ??? Breast cancer   ??? Acquired hypothyroidism   ??? Anxiety and depression   ??? H/O bariatric surgery   ??? Personal history of breast cancer   ??? HSV-1 (herpes simplex virus 1) infection

## 2014-11-22 NOTE — Unmapped (Signed)
synthroid °

## 2014-11-22 NOTE — Unmapped (Signed)
rx d

## 2014-11-22 NOTE — Unmapped (Signed)
D/c valtrex

## 2014-11-26 NOTE — Unmapped (Signed)
Take prozac 1/2 tab x 1 week, i think ferrex is right what did she think i told her on this

## 2014-11-26 NOTE — Unmapped (Signed)
Prozac 20 mg was sent in- is this correct?    Ferrex states to take once a day- is this correct?    Pt states she was informed differently on both meds from the PCP

## 2014-11-26 NOTE — Unmapped (Signed)
LMTCB

## 2014-11-27 NOTE — Unmapped (Signed)
LMTCB

## 2014-11-29 NOTE — Unmapped (Signed)
Sending letter

## 2014-11-29 NOTE — Unmapped (Signed)
LMTCB

## 2014-11-30 MED ORDER — FLUoxetine (PROZAC) 10 MG tablet
10 | ORAL_TABLET | Freq: Every day | ORAL | Status: AC
Start: 2014-11-30 — End: 2015-02-26

## 2014-11-30 NOTE — Unmapped (Signed)
This is a follow up to last note. In answer to your question, pt thought she was supposed to take Ferrex once weekly, not daily. She will not be refilling, it is $30 per month.  Also, if she takes Prozac - 1/2 tab this will = 10 mg, not 5 mg as discussed at appt. Please call to clarify instructions on both.

## 2014-11-30 NOTE — Unmapped (Signed)
prozac 10 set Wanda Rowe get otc iron and take daily

## 2014-12-03 NOTE — Unmapped (Signed)
LMTCB

## 2014-12-04 NOTE — Unmapped (Signed)
PT returned the call and given the below info

## 2014-12-12 ENCOUNTER — Ambulatory Visit: Admit: 2014-12-12 | Discharge: 2014-12-12 | Payer: PRIVATE HEALTH INSURANCE | Attending: Radiation Oncology

## 2014-12-12 DIAGNOSIS — C50111 Malignant neoplasm of central portion of right female breast: Secondary | ICD-10-CM

## 2014-12-12 NOTE — Unmapped (Signed)
Chief Complaint   Patient presents with   ??? Follow-up       Diagnosis  Breast cancer    Primary site: Breast (Right)    Staging method: AJCC 7th Edition    Clinical: Stage IA (T1b, N0, cM0) - Signed by Andree Coss, MD on 02/27/2013    Pathologic free text: Stage    Pathologic: Stage Unknown (TX, N0(i-)) - Signed by Andree Coss, MD on 02/27/2013    Summary: Stage Unknown (TX, N0(i-), cM0)    Previous Treatments  12/26/2012 - Neoadjuvant TCHP   02/09/2013 - Bilateral breast reduction with right breast partial mastectomy and sentinal lymph node biopsy   04/18/2013 - Adjuvant right breast whole breast radiotherapy 42.56Gy in 16Fx without boost due to lack of seroma target       History of Present Illness  Wanda Rowe is feeling well. She has no specific complaints or issues. She is eating more what she describes as junk.  She has rejoined the local gym and is starting to get back to exercise. She has post-coital bleeding which is mild but menorrhagia.         Review of Systems   All other systems reviewed and are negative.       Allergies  Succinylcholine; Zithromax; Silver; Sulfa (sulfonamide antibiotics); and Sulfanilamide    Medications  Outpatient Encounter Prescriptions as of 12/12/2014   Medication Sig Dispense Refill   ??? albuterol (PROVENTIL HFA;VENTOLIN HFA) 90 mcg/actuation inhaler Inhale 1-2 puffs into the lungs every 6 hours as needed. Q4-6H PRN 1 Inhaler 3   ??? aspirin 81 MG EC tablet Take 81 mg by mouth daily.     ??? blood sugar diagnostic (ACCU-CHEK AVIVA PLUS TEST STRP) Strp fsbs qd 100 strip 1   ??? blood-glucose meter (GLUCOSE MONITORING KIT) kit by Other route 2 (two) times daily. Use as instructed - fsbs bid      ??? CALCIUM CITRATE ORAL Take 1 tablet by mouth daily.       ??? conjugated estrogens (PREMARIN) vaginal cream twicwe a week 42.5 g 0   ??? ergocalciferol (ERGOCALCIFEROL) 50,000 unit capsule Take 1 capsule (50,000 Units total) by mouth once a week. 12 capsule 1   ??? FLUoxetine (PROZAC) 10 MG tablet  Take 1 tablet (10 mg total) by mouth daily. 90 tablet 0   ??? fluticasone (FLONASE) 50 mcg/actuation nasal spray Use 2 sprays into each nostril daily. 16 g 5   ??? gabapentin (NEURONTIN) 600 MG tablet Take 1 tablet (600 mg total) by mouth 3 times a day. 270 tablet 1   ??? ipratropium (ATROVENT) 0.03 % nasal spray Use 2 sprays into each nostril 2 times a day. 30 mL 5   ??? LACTOBACILLUS ACIDOPHILUS (PROBIOTIC ORAL) Take 1 capsule by mouth daily.     ??? levothyroxine (SYNTHROID) 75 MCG tablet Take 1 tablet (75 mcg total) by mouth daily. 90 tablet 1   ??? raloxifene (EVISTA) 60 mg tablet Take 60 mg by mouth daily.       No facility-administered encounter medications on file as of 12/12/2014.        Histories  She has a past medical history of Hyperlipidemia; Hypertension; Diabetes mellitus; Hypothyroidism; GERD (gastroesophageal reflux disease); Polyp of stomach; RSD (reflex sympathetic dystrophy); Anxiety; Asthma; and Cancer.    She has past surgical history that includes Anterior cruciate ligament repair; Meniscectomy; Dental surgery; Hysterectomy; Foot surgery; gastric sleeve; and Breast surgery (Right).    Her family history includes Breast cancer in her  paternal grandmother; Cancer in her maternal grandmother; Colon cancer in her maternal grandfather; Diabetes in her father and maternal grandmother; Factor VIII deficiency in her father; Hypertension in her father and mother; Other in her brother and mother; Stroke in her maternal grandmother.    She reports that she has never smoked. She has never used smokeless tobacco. She reports that she drinks alcohol.        Blood pressure 132/60, pulse 72, temperature 97.3 ??F (36.3 ??C), resp. rate 16.  Physical Exam   Vitals reviewed.  Constitutional: She is oriented to person, place, and time. She appears well-developed and well-nourished. No distress.   HENT:   Head: Normocephalic and atraumatic.   Eyes: Conjunctivae are normal. Right eye exhibits no discharge. Left eye exhibits no  discharge. No scleral icterus.   Neck: Normal range of motion. Neck supple.   Cardiovascular: Normal rate, normal heart sounds and intact distal pulses.  Exam reveals no gallop and no friction rub.    No murmur heard.  Pulmonary/Chest: Effort normal and breath sounds normal. She has no wheezes. She has no rales. She exhibits no mass. Right breast exhibits no inverted nipple, no mass, no nipple discharge, no skin change and no tenderness. Left breast exhibits no inverted nipple, no mass, no nipple discharge, no skin change and no tenderness. Breasts are symmetrical.       Abdominal: Soft.   Musculoskeletal: Normal range of motion. She exhibits no edema or tenderness.   Lymphadenopathy:        Head (right side): No submental, no submandibular, no tonsillar, no preauricular, no posterior auricular and no occipital adenopathy present.        Head (left side): No submental, no submandibular, no tonsillar, no preauricular, no posterior auricular and no occipital adenopathy present.     She has no cervical adenopathy.        Right cervical: No superficial cervical, no deep cervical and no posterior cervical adenopathy present.       Left cervical: No superficial cervical, no deep cervical and no posterior cervical adenopathy present.     She has no axillary adenopathy.        Right axillary: No pectoral and no lateral adenopathy present.        Left axillary: No pectoral and no lateral adenopathy present.       Right: No supraclavicular adenopathy present.        Left: No supraclavicular adenopathy present.   Neurological: She is oriented to person, place, and time.   Skin: Skin is warm. No rash noted. She is not diaphoretic. No erythema. No pallor.   Psychiatric: She has a normal mood and affect. Her behavior is normal. Judgment and thought content normal.         Diagnostic Studies Reviewed      Review of Lab Results  Lab Results   Component Value Date    WBC 6.8 11/14/2014    RBC 4.42 11/14/2014    HGB 13.0 11/14/2014     HCT 39.2 11/14/2014    MCV 89 11/14/2014    MCH 29.4 11/14/2014    MCHC 33.2 11/14/2014    RDW 13.6 11/14/2014    PLT 225 11/14/2014    MPV 8.2 07/21/2014    MPV 10.9 10/29/2004          Assessment  Wanda Rowe is doing well. She is without evidence of disease. She has an excellent cosmetic outcome. She has issues with weight due to stress eating  but is not sure of what the specific stressors are. She is increasing her activity.     Disease Status: No evidence of disease  ECOG Performance Status: Fully active, able to carry on all pre-disease performance without restrictions.  Karnofsky Score: Normal, no complaints or evidence of disease.          Plan  She will see her other providers as prescribed. I will plan to see her in a year.     It is a pleasure to take part in the care of this kind woman and her family.

## 2015-02-03 HISTORY — PX: VENA CAVA FILTER PLACEMENT: SUR1032

## 2015-02-19 NOTE — Unmapped (Signed)
faxed

## 2015-02-19 NOTE — Unmapped (Signed)
Pt has a 7am appt with [facility] tomorrow, and called to have her orders faxed. There are no orders. Can you add lab orders today, and please fax to [facility] downstairs.

## 2015-02-20 LAB — CBC AND DIFFERENTIAL
Basophils Absolute: 18 {cells}/uL (ref 0–200)
Basophils Relative: 0.4 %
Eosinophils Absolute: 51 {cells}/uL (ref 15–500)
Eosinophils Relative: 1.1 %
Hematocrit: 37 % (ref 35.0–45.0)
Hemoglobin: 12.5 g/dL (ref 11.7–15.5)
Lymphocytes Absolute: 1610 {cells}/uL (ref 850–3900)
Lymphocytes Relative: 35 %
MCH: 30.3 pg (ref 27.0–33.0)
MCHC: 33.7 g/dL (ref 32.0–36.0)
MCV: 89.8 fL (ref 80.0–100.0)
MPV: 9.3 fL (ref 7.5–11.5)
Monocytes Absolute: 271 {cells}/uL (ref 200–950)
Monocytes Relative: 5.9 %
Neutrophils Absolute: 2650 {cells}/uL (ref 1500–7800)
Neutrophils Relative: 57.6 %
Platelets: 194 Thousand/uL (ref 140–400)
RBC: 4.12 Million/uL (ref 3.80–5.10)
RDW: 14.3 % (ref 11.0–15.0)
WBC: 4.6 Thousand/uL (ref 3.8–10.8)

## 2015-02-20 LAB — COMPREHENSIVE METABOLIC PANEL
A/G Ratio: 1.7 (calc) (ref 1.0–2.5)
ALT: 13 U/L (ref 6–29)
AST: 19 U/L (ref 10–35)
Albumin: 4.1 g/dL (ref 3.6–5.1)
Alkaline Phosphatase: 49 U/L (ref 33–130)
BUN: 20 mg/dL (ref 7–25)
CO2: 24 mmol/L (ref 20–31)
Calcium: 9.2 mg/dL (ref 8.6–10.4)
Chloride: 109 mmol/L (ref 98–110)
Creatinine: 0.62 mg/dL (ref 0.50–1.05)
GFR MDRD Af Amer: 118 mL/min/{1.73_m2} (ref >=60)
Globulin, Total: 2.4 g/dL (calc) (ref 1.9–3.7)
Glucose: 103 mg/dL (ref 65–99)
Potassium: 4.2 mmol/L (ref 3.5–5.3)
Sodium: 141 mmol/L (ref 135–146)
Total Bilirubin: 0.7 mg/dL (ref 0.2–1.2)
Total Protein: 6.5 g/dL (ref 6.1–8.1)
eGFR Non-Afr. American: 102 mL/min/{1.73_m2} (ref >=60)

## 2015-02-20 LAB — T4, FREE: Free T4: 0.8 ng/dL (ref 0.8–1.8)

## 2015-02-20 LAB — TSH: TSH: 1.49 mIU/L

## 2015-02-20 LAB — T3: T3, Total: 94 ng/dL (ref 76–181)

## 2015-02-20 LAB — HEMOGLOBIN A1C: Hemoglobin A1C: 5.5 % of total Hgb (ref <5.7)

## 2015-02-20 LAB — FOLATE: Folic Acid: 23.3 ng/mL

## 2015-02-20 LAB — FERRITIN: Ferritin: 79 ng/mL (ref 10–232)

## 2015-02-20 LAB — VITAMIN B12: Vitamin B-12: 536 pg/mL (ref 200–1100)

## 2015-02-20 LAB — VITAMIN D 25 HYDROXY: Vit D, 25-Hydroxy: 33 ng/mL (ref 30–100)

## 2015-02-26 ENCOUNTER — Ambulatory Visit: Admit: 2015-02-26 | Discharge: 2015-02-26 | Payer: PRIVATE HEALTH INSURANCE

## 2015-02-26 DIAGNOSIS — E039 Hypothyroidism, unspecified: Secondary | ICD-10-CM

## 2015-02-26 MED ORDER — levothyroxine (SYNTHROID) 75 MCG tablet
75 | ORAL_TABLET | Freq: Every day | ORAL | Status: AC
Start: 2015-02-26 — End: 2015-09-04

## 2015-02-26 MED ORDER — gabapentin (NEURONTIN) 600 MG tablet
600 | ORAL_TABLET | Freq: Three times a day (TID) | ORAL | Status: AC
Start: 2015-02-26 — End: 2015-10-31

## 2015-02-26 MED ORDER — FLUoxetine (PROZAC) 10 MG tablet
10 | ORAL_TABLET | Freq: Every day | ORAL | Status: AC
Start: 2015-02-26 — End: 2015-09-09

## 2015-02-26 MED ORDER — cyanocobalamin (B-12 DOTS) 500 MCG tablet
500 | ORAL_TABLET | Freq: Every day | ORAL | Status: AC
Start: 2015-02-26 — End: 2015-10-31

## 2015-02-26 MED ORDER — iron 18 mg Tab
18 | Freq: Every day | ORAL | Status: AC
Start: 2015-02-26 — End: 2015-09-04

## 2015-02-26 MED ORDER — cholecalciferol, vitamin D3, 2,000 unit Cap
50 | ORAL_CAPSULE | Freq: Every day | ORAL | Status: AC
Start: 2015-02-26 — End: ?

## 2015-02-26 NOTE — Unmapped (Signed)
Instructed pt to continue current meds

## 2015-02-26 NOTE — Unmapped (Signed)
Subjective  HPI:   Patient ID: Wanda Rowe is a 55 y.o. female.    Chief Complaint:  HPI Comments: F/u above also c/o right heel pain x 2 days. When takes deep breath pain under left breast, has been there a while.     Anxiety        Depression  Pertinent negatives include no fatigue.              Medications:  Current Outpatient Prescriptions   Medication Sig Dispense Refill   ??? albuterol (PROVENTIL HFA;VENTOLIN HFA) 90 mcg/actuation inhaler Inhale 1-2 puffs into the lungs every 6 hours as needed. Q4-6H PRN 1 Inhaler 3   ??? aspirin 81 MG EC tablet Take 81 mg by mouth daily.     ??? blood sugar diagnostic (ACCU-CHEK AVIVA PLUS TEST STRP) Strp fsbs qd 100 strip 1   ??? blood-glucose meter (GLUCOSE MONITORING KIT) kit by Other route 2 (two) times daily. Use as instructed - fsbs bid      ??? CALCIUM CITRATE ORAL Take 1 tablet by mouth daily.       ??? conjugated estrogens (PREMARIN) vaginal cream twicwe a week 42.5 g 0   ??? FLUoxetine (PROZAC) 10 MG tablet Take 1 tablet (10 mg total) by mouth daily. 90 tablet 0   ??? fluticasone (FLONASE) 50 mcg/actuation nasal spray Use 2 sprays into each nostril daily. 16 g 5   ??? gabapentin (NEURONTIN) 600 MG tablet Take 1 tablet (600 mg total) by mouth 3 times a day. 270 tablet 1   ??? ipratropium (ATROVENT) 0.03 % nasal spray Use 2 sprays into each nostril 2 times a day. 30 mL 5   ??? LACTOBACILLUS ACIDOPHILUS (PROBIOTIC ORAL) Take 1 capsule by mouth daily.     ??? levothyroxine (SYNTHROID) 75 MCG tablet Take 1 tablet (75 mcg total) by mouth daily. 90 tablet 1   ??? raloxifene (EVISTA) 60 mg tablet Take 60 mg by mouth daily.     ??? cholecalciferol, vitamin D3, 2,000 unit Cap Take 1 capsule by mouth daily. 1 capsule 0   ??? cyanocobalamin (B-12 DOTS) 500 MCG tablet Take 1 tablet (500 mcg total) by mouth daily. 30 tablet 0   ??? iron 18 mg Tab Take 1 tablet by mouth daily. 1 each 0     No current facility-administered medications for this visit.        ROS:   Review of Systems   Constitutional: Negative  for fatigue.   Psychiatric/Behavioral: Positive for depression.   All other systems reviewed and are negative.         Objective:   Physical Exam   Constitutional: She appears well-developed and well-nourished.   HENT:   Head: Normocephalic and atraumatic.   Cardiovascular: Normal rate, regular rhythm and normal heart sounds.  Exam reveals no gallop and no friction rub.    No murmur heard.  Pulmonary/Chest: Effort normal and breath sounds normal.   Abdominal: Soft. Bowel sounds are normal.   Musculoskeletal: Normal range of motion.   Psychiatric: She has a normal mood and affect. Her behavior is normal.             Filed Vitals:    02/26/15 0822   BP: 110/80   Pulse: 66   Resp: 15   Height: 5' 8 (1.727 m)   Weight: 213 lb (96.616 kg)   SpO2: 98%     Body mass index is 32.39 kg/(m^2).  Body surface area is 2.15 meters squared.  Assessment/Plan:     Patient Active Problem List   Diagnosis   ??? Vitamin D deficiency   ??? RSD lower limb   ??? Breast cancer   ??? Acquired hypothyroidism   ??? Anxiety and depression   ??? H/O bariatric surgery   ??? HSV-1 (herpes simplex virus 1) infection   ??? Personal history of breast cancer

## 2015-02-26 NOTE — Unmapped (Signed)
Switch to daily otc

## 2015-03-18 MED ORDER — valACYclovir (VALTREX) 500 MG tablet
500 | ORAL_TABLET | ORAL | Status: AC
Start: 2015-03-18 — End: 2017-01-06

## 2015-04-02 NOTE — Unmapped (Signed)
[facility] [phone] - Dr. Schmulewitz=- pt needs a referral- sent to this office. Pt is to get a colonoscopy and a upper endoscopy -call pt when completed.

## 2015-04-02 NOTE — Unmapped (Signed)
Please add referral into chart

## 2015-04-05 NOTE — Unmapped (Signed)
Pt is due for an EGD/COLO 07/2015 for hx of gastric and colon polyps.   Ok to schedule.   She can still have an EGD despite gastric sleeve.   She is aware and was given the scheduling number to arrange.

## 2015-04-05 NOTE — Unmapped (Signed)
This patient stated that Dr. Mickie Bail has performed her previous colonoscopy and endoscopy. She states she needs to schedule both procedures again, however, she has had a gastric sleeve since her last procedure. The patient would like to speak with someone from the office to see if she can still have the procedures performed due to the sleeve. The patient can be contacted to discuss and her number is [phone].

## 2015-04-10 NOTE — Unmapped (Signed)
3/8   LVM to schedule EGD.  I gave my direct number to call.

## 2015-04-19 NOTE — Unmapped (Signed)
3/17  Returned call to patient to schedule EGD.  I gave my direct number to call.

## 2015-05-06 DIAGNOSIS — R87629 Unspecified abnormal cytological findings in specimens from vagina: Secondary | ICD-10-CM | POA: Insufficient documentation

## 2015-06-18 ENCOUNTER — Ambulatory Visit: Admit: 2015-06-18 | Discharge: 2015-06-18 | Payer: PRIVATE HEALTH INSURANCE | Attending: Family

## 2015-06-18 DIAGNOSIS — S060X0A Concussion without loss of consciousness, initial encounter: Secondary | ICD-10-CM

## 2015-06-18 NOTE — Unmapped (Signed)
Pt fell down the steps at home last night=- and pt has a bad headache - pt hurt her knee and back as well. Can you see pt today? Pt is not feeling great. She does not think she hit her head but has a bad headache. Can you see pt today? Pt is about one hour away

## 2015-06-18 NOTE — Unmapped (Signed)
Pt declined appt tomorrow w/ Dr. Rico Junker.  Pt wanted to see Joni today. appt made

## 2015-06-18 NOTE — Unmapped (Signed)
[facility] [address]  [address]  [place] [address]    Name:  Wanda Rowe Date of Birth: 1960/08/29 (55 y.o.)   MRN: [id number]    Date of Service:  06/18/2015    Subjective  Chief Complaint:     Chief Complaint   Patient presents with   ??? Fall     last night down stairs   ??? Headache     doesn't remember if she hit her head   ??? Back Pain     lower   ??? Knee Pain     right knee       History of Present Illness:   [name] is a(n) 55 y.o. female here today for the following:     Fall  The accident occurred 6 to 12 hours ago. The fall occurred while walking (thought she was on the bottom step and missed steps while walking in the dark. unsure how many stairs she fell down). She fell from an unknown height. She landed on hard floor. There was no blood loss. Point of impact: patient unsure. The pain is present in the head, right knee and back. The pain is at a severity of 5/10. The pain is moderate. Associated symptoms include headaches. Pertinent negatives include no abdominal pain, bowel incontinence, fever, hearing loss, hematuria, loss of consciousness, nausea, numbness, tingling, visual change or vomiting. Associated symptoms comments: Emotional instability. I was sitting at my desk at work and wanted to cry which is not like me. states emotions not related to pains.  .   Headache   This is a new problem. The current episode started today. The problem has been unchanged. The pain is located in the bilateral, frontal and occipital (suspect coup counter-coup) region. The pain does not radiate. The pain quality is not similar to prior headaches. Associated symptoms include back pain. Pertinent negatives include no abdominal pain, abnormal behavior, anorexia, blurred vision, coughing, dizziness, drainage, ear pain, eye pain, eye redness, eye watering, facial sweating, fever, hearing loss, insomnia, loss of balance, muscle aches, nausea, neck pain, numbness, phonophobia,  photophobia, rhinorrhea, scalp tenderness, seizures, sinus pressure, sore throat, swollen glands, tingling, tinnitus, visual change, vomiting, weakness or weight loss. Exacerbated by: fall. She has tried NSAIDs for the symptoms. The treatment provided no relief.   Back Pain  Associated symptoms include headaches. Pertinent negatives include no abdominal pain, bowel incontinence, fever, numbness, tingling, weakness or weight loss.   Knee Pain   Pertinent negatives include no numbness or tingling.       Current Outpatient Medications:  Current Outpatient Prescriptions   Medication Sig Dispense Refill   ??? aspirin 81 MG EC tablet Take 81 mg by mouth daily.     ??? blood sugar diagnostic (ACCU-CHEK AVIVA PLUS TEST STRP) Strp fsbs qd 100 strip 1   ??? blood-glucose meter (GLUCOSE MONITORING KIT) kit by Other route 2 (two) times daily. Use as instructed - fsbs bid      ??? CALCIUM CITRATE ORAL Take 1 tablet by mouth daily.       ??? cholecalciferol, vitamin D3, 2,000 unit Cap Take 1 capsule by mouth daily. 1 capsule 0   ??? conjugated estrogens (PREMARIN) vaginal cream twicwe a week 42.5 g 0   ??? cyanocobalamin (B-12 DOTS) 500 MCG tablet Take 1 tablet (500 mcg total) by mouth daily. 30 tablet 0   ??? FLUoxetine (PROZAC) 10 MG tablet Take 1 tablet (10 mg total)  by mouth daily. 90 tablet 1   ??? fluticasone (FLONASE) 50 mcg/actuation nasal spray Use 2 sprays into each nostril daily. 16 g 5   ??? gabapentin (NEURONTIN) 600 MG tablet Take 1 tablet (600 mg total) by mouth 3 times a day. 270 tablet 1   ??? ipratropium (ATROVENT) 0.03 % nasal spray Use 2 sprays into each nostril 2 times a day. 30 mL 5   ??? iron 18 mg Tab Take 1 tablet by mouth daily. 1 each 0   ??? LACTOBACILLUS ACIDOPHILUS (PROBIOTIC ORAL) Take 1 capsule by mouth daily.     ??? levothyroxine (SYNTHROID) 75 MCG tablet Take 1 tablet (75 mcg total) by mouth daily. 90 tablet 1   ??? raloxifene (EVISTA) 60 mg tablet Take 60 mg by mouth daily.     ??? albuterol (PROVENTIL HFA;VENTOLIN HFA) 90  mcg/actuation inhaler Inhale 1-2 puffs into the lungs every 6 hours as needed. Q4-6H PRN 1 Inhaler 3   ??? valACYclovir (VALTREX) 500 MG tablet TAKE 1 TABLET BY MOUTH EVERY DAY 30 tablet 0     No current facility-administered medications for this visit.         ROS:   Review of Systems   Constitutional: Negative for fever and weight loss.   HENT: Negative for ear pain, hearing loss, rhinorrhea, sinus pressure, sore throat and tinnitus.    Eyes: Negative for blurred vision, photophobia, pain and redness.   Respiratory: Negative for cough.    Gastrointestinal: Negative for nausea, vomiting, abdominal pain, anorexia and bowel incontinence.   Genitourinary: Negative for hematuria.   Musculoskeletal: Positive for back pain. Negative for neck pain.   Neurological: Positive for headaches. Negative for dizziness, tingling, seizures, loss of consciousness, weakness, numbness and loss of balance.   Psychiatric/Behavioral: The patient does not have insomnia.    All other systems reviewed and are negative.         Objective:     Filed Vitals:    06/18/15 1403   BP: 126/82   Pulse: 60   Resp: 12   Height: 5' 8 (1.727 m)   Weight: 216 lb (97.977 kg)     Body mass index is 32.85 kg/(m^2).    Physical Exam   Vitals reviewed.  Constitutional: She is oriented to person, place, and time. Vital signs are normal. She appears well-developed and well-nourished. No distress.   Cardiovascular: Normal rate, regular rhythm, S1 normal, S2 normal and normal heart sounds.    Pulmonary/Chest: Effort normal and breath sounds normal. No accessory muscle usage. No respiratory distress. She has no decreased breath sounds. She has no wheezes. She has no rhonchi. She has no rales.   Neurological: She is alert and oriented to person, place, and time. She has normal strength. She displays no atrophy and no tremor. No cranial nerve deficit or sensory deficit. She exhibits normal muscle tone. She displays a negative Romberg sign. She displays no seizure  activity. Coordination and gait normal. GCS eye subscore is 4. GCS verbal subscore is 5. GCS motor subscore is 6.   Skin: She is not diaphoretic.   Psychiatric: She has a normal mood and affect. Her behavior is normal. Judgment and thought content normal.             Assessment/Plan:   Cleotilde was seen today for fall, headache, back pain and knee pain.    Diagnoses and all orders for this visit:    Concussion, without loss of consciousness, initial encounter    Acute  post-traumatic headache, not intractable      No Follow-up on file.       Addison Naegeli, CNP

## 2015-06-24 NOTE — Telephone Encounter (Signed)
Returned call to patient to advise prep instructions were mailed out 3/33 for the 20 doses of Miralax cleanse.  I asked that she return my call to give me an email address so I may email her the instructions for her procedure on 6/16.  I gave my direct number.

## 2015-07-08 NOTE — Unmapped (Signed)
Patient called and states she did not get her paperwork for her procedure and would like a different prep as the golytley made her vomit so much they were unable to do her procedure last time.  Movi prep and all instructions sent to the patient

## 2015-07-17 NOTE — Unmapped (Signed)
Pt was at urgent care yesterday, 07/16/2015, diagnosed w/urinary tract infection and wants to know if procedure should still happen.  Please contact pt [phone].  Pt asking for direct ph.

## 2015-07-17 NOTE — Telephone Encounter (Signed)
Returned call to patient, answered the questions she had.  I gave my direct number should she have anymore.

## 2015-07-18 MED ORDER — lidocaine (PF) 2% (20 mg/mL) Soln 20 mg
20 | Freq: Once | INTRAMUSCULAR | Status: AC | PRN
Start: 2015-07-18 — End: 2015-07-18

## 2015-07-18 MED ORDER — lactated Ringers infusion
INTRAVENOUS | Status: AC
Start: 2015-07-18 — End: 2015-07-19
  Administered 2015-07-19: 12:00:00 via INTRAVENOUS

## 2015-07-19 MED ORDER — sterile water irrigation
Status: AC
Start: 2015-07-19 — End: ?

## 2015-07-19 MED ORDER — propofol 10 mg/ml (DIPRIVAN) injection
10 | INTRAVENOUS | Status: AC | PRN
Start: 2015-07-19 — End: 2015-07-19
  Administered 2015-07-19: 12:00:00 150 via INTRAVENOUS
  Administered 2015-07-19: 12:00:00 100 via INTRAVENOUS
  Administered 2015-07-19: 12:00:00 150 via INTRAVENOUS

## 2015-07-19 MED ORDER — propofol 10 mg/ml (DIPRIVAN) INFUSION 200 mg
10 | INTRAVENOUS | Status: AC | PRN
Start: 2015-07-19 — End: 2015-07-19
  Administered 2015-07-19: 12:00:00 120 mg via INTRAVENOUS

## 2015-07-19 MED ORDER — sodium chloride 0.9 % infusion
INTRAVENOUS | Status: AC
Start: 2015-07-19 — End: 2015-07-19

## 2015-07-19 MED FILL — SODIUM CHLORIDE 0.9 % INTRAVENOUS SOLUTION: 50.00 50.00 mL/hr | INTRAVENOUS | Qty: 1000

## 2015-07-19 MED FILL — WATER FOR IRRIGATION, STERILE SOLUTION: Qty: 1000

## 2015-07-19 NOTE — Unmapped (Signed)
Chief complaint:    Gastric and colon polyp surveill  PMH: htn, hld, hypothyroid, obesity, breast cancer, anxiety, RSD  PSH:TAH, breast surgery, knee surgery, gastric sleeve  Home/hospital medication list has been reviewed.  Tobacco-      none  Allergies-       See list  ROS-             no additional relevant information  Exam:  preprocedural vitals checked  Skin-              normal  Head/neck-    normal  Lungs-           Normal  Heart-             Regular rate/rhythm  Abdomen        Soft, nondistended, nontender with (+)BS  Neurologic       Alert, oriented and appropriate; motor/sensory exam grossly normal    IMPRESSION:  Patient appears appropriate for planned procedure.  Plan for         MAC      ASA Score       3  Mallampati Class   3

## 2015-07-19 NOTE — Unmapped (Signed)
Endoscopy  Post Procedure Briefing Note: Wanda Rowe      Specimens:     Prior to leaving the room the nurse confirmed procedure, specimen labeling, documentation of deployables, any equipment issues.  Are there any recovery management concerns.   No      Other Comments:     Signed: Frederich Balding. Kylea Berrong    Date: 07/19/2015    Time: 8:34 AM

## 2015-07-19 NOTE — Unmapped (Signed)
Regina RN calls Dr. Mickie Bail to report pt's bowel status. Order received for enema.7 Regina RN instills tap water enema . [phone] pt up to BR returns clear fluid after enema.

## 2015-07-19 NOTE — Unmapped (Signed)
Anesthesia Transfer of Care Note    Patient: Wanda Rowe  Procedure(s) Performed: Procedure(s):  ESOPHAGOGASTRODUODENOSCOPY WITH COLONOSCOPY WITH MAC    Patient location: Same Day Surgery    Anesthesia type: MAC    Airway Device on Arrival to PACU/ICU: Nasal Cannula    IV Access: Peripheral    Monitors Recommended to be Used During PACU/ICU: Standard Monitors    Outstanding Issues to Address: None    Level of Consciousness: awake, alert  and oriented    Post vital signs:    Filed Vitals:    07/19/15 0740   BP: 142/76   Pulse: 72   Temp: 98.1 ??F (36.7 ??C)   Resp: 21   SpO2: 100%       Complications: None      Date 07/18/15 0700 - 07/19/15 0659(Not Admitted) 07/19/15 0700 - 07/20/15 0659   Shift 0700-1459 1500-2259 2300-0659 24 Hour Total 0700-1459 1500-2259 2300-0659 24 Hour Total   I  N  T  A  K  E   I.V.     450  (4.6)   450  (4.6)      Volume (mL) (lactated Ringers infusion)     450   450    Shift Total  (mL/kg)     450  (4.6)   450  (4.6)   O  U  T  P  U  T   Shift Total  (mL/kg)           Weight (kg)     97.1 97.1 97.1 97.1

## 2015-07-19 NOTE — Unmapped (Signed)
Pt ambulating in room, gait steady.  IV D/C'd cannula intact.  Pt D/C'd per WC transport.

## 2015-07-19 NOTE — Unmapped (Signed)
[facility]  GI   _______________________________________________________________________________  Patient Name: Wanda Rowe             Procedure Date: 07/19/2015 8:14 AM  MRN: [id number]                         Date of Birth: 06/07/1960  Attending MD: Lurene Shadow , MD   _______________________________________________________________________________     Procedure:           Colonoscopy  Indications:         High risk colon cancer surveillance: Personal history of                        colonic polyps  Referring MD:        Gari Crown (Referring MD)  Medicines:           Monitored Anesthesia Care  Complications:       No immediate complications.  _______________________________________________________________________________  Procedure:           Pre-Anesthesia Assessment:                       - Prior to the procedure, a History and Physical was                        performed, and patient medications and allergies were                        reviewed. The patient's tolerance of previous anesthesia                        was also reviewed. The risks and benefits of the                        procedure and the sedation options and risks were                        discussed with the patient. All questions were answered,                        and informed consent was obtained. Prior Anticoagulants:                        The patient last took aspirin 1 day prior to the                        procedure. ASA Grade Assessment: III - A patient with                        severe systemic disease. After reviewing the risks and                        benefits, the patient was deemed in satisfactory                        condition to undergo the procedure.                       - Prior to the procedure, a History and Physical was  performed, and patient medications and allergies were                        reviewed. The patient's tolerance of previous anesthesia                         was also reviewed. The risks and benefits of the                        procedure and the sedation options and risks were                        discussed with the patient. All questions were answered,                        and informed consent was obtained. Prior Anticoagulants:                        The patient last took aspirin 1 day prior to the                        procedure. ASA Grade Assessment: III - A patient with                        severe systemic disease. After reviewing the risks and                        benefits, the patient was deemed in satisfactory                        condition to undergo the procedure.                       After I obtained informed consent, the scope was passed                        under direct vision. Throughout the procedure, the                        patient's blood pressure, pulse, and oxygen saturations                        were monitored continuously. The endoscope was                        introduced through the anus and advanced to the terminal                        ileum. The colonoscopy was performed without difficulty.                        The patient tolerated the procedure well. The quality of                        the bowel preparation was good. The terminal ileum,                        ileocecal valve, appendiceal orifice, and rectum were  photographed.                                                                                   Findings:       The entire examined colon appeared normal.                                                                                   Impression:          - The entire examined colon is normal.                       - No specimens collected.  Recommendation:      - Patient has a contact number available for                        emergencies. The signs and symptoms of potential delayed                        complications were discussed with the patient. Return to                         normal activities tomorrow. Written discharge                        instructions were provided to the patient.                       - Resume previous diet.                       - Continue present medications.                       - Repeat colonoscopy in 7 years for surveillance.                                                                                   Procedure Code(s):   --- Professional ---                       [phone], Colonoscopy, flexible; diagnostic, including                        collection of specimen(s) by brushing or washing, when  performed (separate procedure)  Diagnosis Code(s):   --- Professional ---                       Z12.11, Encounter for screening for malignant neoplasm                        of colon                       Z86.010, Personal history of colonic polyps    CPT copyright 2016 [employer]. All rights reserved.    The codes documented in this report are preliminary and upon coder review may   be revised to meet current compliance requirements.    Lurene Shadow, MD  ______________________  Lurene Shadow, MD  07/19/2015 8:39:46 AM  This report has been signed electronically.  Number of Addenda: 0    Note Initiated On: 07/19/2015 8:14 AM  Scope Withdrawal Time 0 hours 9 minutes 3 seconds   Total Procedure Duration Time 0 hours 19 minutes 45 seconds   Scope In: 8:14:57 AM  Scope Out: 8:34:42 AM

## 2015-07-19 NOTE — Unmapped (Signed)
Anesthesia Post Note    Patient: Wanda Rowe    Procedure(s) Performed: Procedure(s):  ESOPHAGOGASTRODUODENOSCOPY WITH COLONOSCOPY WITH MAC    Anesthesia type: MAC    Patient location: Same Day Surgery    Post pain: Adequate analgesia    Post assessment: no apparent anesthetic complications    Last Vitals:   Filed Vitals:    07/19/15 0740 07/19/15 0844 07/19/15 0847 07/19/15 0901   BP: 142/76 89/44 95/52  100/52   Pulse: 72 68 67 67   Temp: 98.1 ??F (36.7 ??C) 97.4 ??F (36.3 ??C)  97.7 ??F (36.5 ??C)   TempSrc: Oral Temporal  Temporal   Resp: 21 22 16 20    Height: 5' 8 (1.727 m)      Weight: 214 lb 1 oz (97.098 kg)      SpO2: 100% 98% 99% 100%        Post vital signs: stable    Level of consciousness: awake    Complications: None

## 2015-07-19 NOTE — Unmapped (Signed)
Pt and family instructed on discharge sheet, verbalized understanding.  Pt tolerating PO fluid no N/V.  Pt denies pain.  BS+.

## 2015-07-19 NOTE — Unmapped (Signed)
[place]  DEPARTMENT OF ANESTHESIOLOGY  PRE-PROCEDURAL EVALUATION    Wanda Rowe is a 55 y.o. year old female presenting for:    Procedure(s):  ESOPHAGOGASTRODUODENOSCOPY WITH COLONOSCOPY WITH MAC    Surgeon:   Lurene Shadow, MD    Chief Complaint     Gastric polyps [K31.7]; History of colon polyps [Z86.010]    Review of Systems     Anesthesia Evaluation         History of anesthetic complications (muscle pain with succinyl choline)         Cardiovascular:    Exercise tolerance:  Duke Met score: 3 - Walking on a flat surface for one or two blocks.Valvular problems/murmurs related to MVP.    (-) hypertension, past MI, CAD, cardiomyopathy, CABG/stent, angina, CHF.    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    (-) seizures, TIA, CVA.     Pulmonary:    (+) asthma.    (-) sleep apnea.       GI/Hepatic/Renal:    GERD is well controlled.    (-) liver disease, renal disease.    Endo/Other:    (+) hypothyroidism.      (-) diabetes mellitus.       Past Medical History     Past Medical History   Diagnosis Date   ??? Hyperlipidemia    ??? Hypertension    ??? Diabetes mellitus      Type 2   ??? Hypothyroidism    ??? GERD (gastroesophageal reflux disease)    ??? Polyp of stomach    ??? RSD (reflex sympathetic dystrophy)      R knee   ??? Anxiety    ??? Asthma      Mild   ??? Cancer      breast       Past Surgical History     Past Surgical History   Procedure Laterality Date   ??? Anterior cruciate ligament repair       L    ??? Meniscectomy       L x 2, R x 1   ??? Dental surgery       Periodontal    ??? Hysterectomy     ??? Foot surgery     ??? Gastric sleeve     ??? Breast surgery Right      Lumpectomy chemo and radiation       Family History     Family History   Problem Relation Age of Onset   ??? Hypertension Mother    ??? Other Mother      DDD   ??? Diabetes Father      Type 2   ??? Hypertension Father    ??? Factor VIII deficiency Father    ??? Other Brother      Back Pain   ??? Diabetes Maternal Grandmother      Type 2   ???  Cancer Maternal Grandmother      Bladder CA   ??? Stroke Maternal Grandmother    ??? Colon cancer Maternal Grandfather    ??? Breast cancer Paternal Grandmother        Social History     Social History     Social History   ??? Marital Status: Single     Spouse Name: N/A   ??? Number of Children: N/A   ??? Years of Education: N/A     Occupational History   ??? [employer]  Social History Main Topics   ??? Smoking status: Never Smoker    ??? Smokeless tobacco: Never Used      Comment: (06/23/2006)   ??? Alcohol Use: Yes      Comment: Occasionally (06/23/2006)   ??? Drug Use: Not on file   ??? Sexual Activity: Not on file     Other Topics Concern   ??? Caffeine Use No   ??? Exercise No     Social History Narrative       Medications     Allergies:  Allergies   Allergen Reactions   ??? Succinylcholine Hives     Delayed paralysis  Difficulty w/ muscle movement the day after surgery   ??? Zithromax [Azithromycin]      **DOES NOT WORK**   ??? Silver Rash   ??? Sulfa (Sulfonamide Antibiotics) Swelling and Rash   ??? Sulfanilamide Rash     Sulfonamides       Home Meds:  Prior to Admission medications as of 07/19/15 0646   Medication Sig Taking?   albuterol (PROVENTIL HFA;VENTOLIN HFA) 90 mcg/actuation inhaler Inhale 1-2 puffs into the lungs every 6 hours as needed. Q4-6H PRN    aspirin 81 MG EC tablet Take 81 mg by mouth daily.    blood sugar diagnostic (ACCU-CHEK AVIVA PLUS TEST STRP) Strp fsbs qd    blood-glucose meter (GLUCOSE MONITORING KIT) kit by Other route 2 (two) times daily. Use as instructed - fsbs bid     CALCIUM CITRATE ORAL Take 1 tablet by mouth daily.      cholecalciferol, vitamin D3, 2,000 unit Cap Take 1 capsule by mouth daily.    conjugated estrogens (PREMARIN) vaginal cream twicwe a week    cyanocobalamin (B-12 DOTS) 500 MCG tablet Take 1 tablet (500 mcg total) by mouth daily.    FLUoxetine (PROZAC) 10 MG tablet Take 1 tablet (10 mg total) by mouth daily.    fluticasone (FLONASE) 50 mcg/actuation nasal spray Use 2 sprays into each nostril  daily.    gabapentin (NEURONTIN) 600 MG tablet Take 1 tablet (600 mg total) by mouth 3 times a day.    ipratropium (ATROVENT) 0.03 % nasal spray Use 2 sprays into each nostril 2 times a day.    iron 18 mg Tab Take 1 tablet by mouth daily.    LACTOBACILLUS ACIDOPHILUS (PROBIOTIC ORAL) Take 1 capsule by mouth daily.    levothyroxine (SYNTHROID) 75 MCG tablet Take 1 tablet (75 mcg total) by mouth daily.    raloxifene (EVISTA) 60 mg tablet Take 60 mg by mouth daily.    valACYclovir (VALTREX) 500 MG tablet TAKE 1 TABLET BY MOUTH EVERY DAY        Inpatient Meds:  Scheduled:    Continuous:   ??? lactated Ringers     ??? sodium chloride         PRN:     Vital Signs     Wt Readings from Last 3 Encounters:   06/18/15 216 lb (97.977 kg)   02/26/15 213 lb (96.616 kg)   11/22/14 221 lb (100.245 kg)     Ht Readings from Last 3 Encounters:   06/18/15 5' 8 (1.727 m)   02/26/15 5' 8 (1.727 m)   11/22/14 5' 8 (1.727 m)     Temp Readings from Last 3 Encounters:   12/12/14 97.3 ??F (36.3 ??C)    06/06/14 98.4 ??F (36.9 ??C)    11/13/13 97.9 ??F (36.6 ??C)      BP Readings from Last  3 Encounters:   06/18/15 126/82   02/26/15 110/80   12/12/14 132/60     Pulse Readings from Last 3 Encounters:   06/18/15 60   02/26/15 66   12/12/14 72     SpO2 Readings from Last 3 Encounters:   02/26/15 98%       Physical Exam     Airway:     Mallampati: II  Mouth Opening: >2 FB  TM distance: > = 3 FB  Neck ROM: full    Dental:   - No obvious cracked, loose, chipped, or missing teeth.     Pulmonary:        Cardiovascular:       Neuro/Musculoskeletal/Psych:      Abdominal:       Current OB Status:       Other Findings:        Laboratory Data     Lab Results   Component Value Date    WBC 4.6 02/20/2015    HGB 12.5 02/20/2015    HCT 37.0 02/20/2015    MCV 89.8 02/20/2015    PLT 194 02/20/2015       No results found for: [facility]    Lab Results   Component Value Date    GLUCOSE 103* 02/20/2015    BUN 20 02/20/2015    CO2 24 02/20/2015    CREATININE 0.62 02/20/2015    K  4.2 02/20/2015    NA 141 02/20/2015    CL 109 02/20/2015    CALCIUM 9.2 02/20/2015    ALBUMIN 4.1 02/20/2015    PROT 6.5 02/20/2015    ALKPHOS 49 02/20/2015    ALT 13 02/20/2015    AST 19 02/20/2015    BILITOT 0.7 02/20/2015       No results found for: PTT, INR    No results found for: PREGTESTUR, PREGSERUM, HCG, HCGQUANT    Anesthesia Plan     ASA 3           Anesthesia Type:  MAC.         Intravenous induction.    Anesthetic plan and risks discussed with patient.    Plan, alternatives, and risks of anesthesia, including death, have been explained to and discussed with the patient/legal guardian.  By my assessment, the patient/legal guardian understands and agrees.  Scenario presented in detail.  Questions answered.      Plan discussed with CRNA.

## 2015-07-19 NOTE — Unmapped (Signed)
[facility]  GI   _______________________________________________________________________________  Patient Name: Wanda Rowe             Procedure Date: 07/19/2015 8:00 AM  MRN: [id number]                         Date of Birth: 1960/11/03  Attending MD: Lurene Shadow , MD   _______________________________________________________________________________     Procedure:           Upper GI endoscopy  Indications:         Surveillance for malignancy due to personal history of                        gastric hyperplastic polyp with HGD  Referring MD:        Gari Crown (Referring MD)  Medicines:           Monitored Anesthesia Care  Complications:       No immediate complications.  _______________________________________________________________________________  Procedure:           Pre-Anesthesia Assessment:                       - Prior to the procedure, a History and Physical was                        performed, and patient medications and allergies were                        reviewed. The patient's tolerance of previous anesthesia                        was also reviewed. The risks and benefits of the                        procedure and the sedation options and risks were                        discussed with the patient. All questions were answered,                        and informed consent was obtained. Prior Anticoagulants:                        The patient last took aspirin 1 day prior to the                        procedure. ASA Grade Assessment: III - A patient with                        severe systemic disease. After reviewing the risks and                        benefits, the patient was deemed in satisfactory                        condition to undergo the procedure.                       After obtaining informed consent, the endoscope was  passed under direct vision. Throughout the procedure,                        the patient's blood pressure, pulse, and oxygen                         saturations were monitored continuously. The endoscope                        was introduced through the mouth, and advanced to the                        third part of duodenum. The upper GI endoscopy was                        accomplished without difficulty. The patient tolerated                        the procedure well.                                                                                   Findings:       LA Grade C (one or more mucosal breaks continuous between tops of 2 or        more mucosal folds, less than 75% circumference) esophagitis with no        bleeding was found.       Evidence of a sleeve gastrectomy was found in the gastric body. This was        characterized by healthy appearing mucosa.       The stomach was normal.       The examined duodenum was normal.                                                                                   Impression:          - LA Grade C reflux esophagitis.                       - S/p gastric sleeve.                       - No gastric polyps  Recommendation:      - Patient has a contact number available for                        emergencies. The signs and symptoms of potential delayed                        complications were discussed with the patient. Return to  normal activities tomorrow. Written discharge                        instructions were provided to the patient.                       - Use Prilosec (omeprazole) 20 mg PO daily.                                                                                   Procedure Code(s):   --- Professional ---                       [phone], Esophagogastroduodenoscopy, flexible, transoral;                        diagnostic, including collection of specimen(s) by                        brushing or washing, when performed (separate procedure)  Diagnosis Code(s):   --- Professional ---                       K21.0, Gastro-esophageal reflux disease with esophagitis                        Z98.84, Bariatric surgery status                       Z86.018, Personal history of other benign neoplasm    CPT copyright 2016 American Medical Association. All rights reserved.    The codes documented in this report are preliminary and upon coder review may   be revised to meet current compliance requirements.    Lurene Shadow, MD  ______________________  Lurene Shadow, MD  07/19/2015 8:13:51 AM  This report has been signed electronically.  Number of Addenda: 0    Note Initiated On: 07/19/2015 8:00 AM  Total Procedure Duration Time 0 hours 4 minutes 38 seconds   Scope In: 8:05:37 AM  Scope Out: 8:10:15 AM

## 2015-08-12 DIAGNOSIS — N390 Urinary tract infection, site not specified: Secondary | ICD-10-CM | POA: Insufficient documentation

## 2015-09-03 NOTE — Unmapped (Signed)
lmcb on dr. Rico Junker vm

## 2015-09-03 NOTE — Unmapped (Signed)
lmcb w/ hovermale x 2

## 2015-09-03 NOTE — Unmapped (Addendum)
Per Nickell- ice it, IBU and make appt in AM w/ Hovermale.  I add PT in the AM.

## 2015-09-03 NOTE — Unmapped (Signed)
ok 

## 2015-09-03 NOTE — Unmapped (Signed)
PT is calling bc she has a knot in her left leg. She noticed it yesterday morning. It is warm to the touch, hurts when she walks, red, swollen. You can feel it. Should she be concerned? She has concerns that it could be a blood clot. She does have varicose veins.  I did offer an appt with Dr. Astrid Drafts, but she is in [place] right now. Please advise.

## 2015-09-04 ENCOUNTER — Ambulatory Visit: Admit: 2015-09-04 | Discharge: 2015-09-04 | Payer: PRIVATE HEALTH INSURANCE

## 2015-09-04 ENCOUNTER — Inpatient Hospital Stay: Admit: 2015-09-04 | Payer: PRIVATE HEALTH INSURANCE

## 2015-09-04 DIAGNOSIS — I824Z2 Acute embolism and thrombosis of unspecified deep veins of left distal lower extremity: Secondary | ICD-10-CM

## 2015-09-04 DIAGNOSIS — F32A Depression, unspecified: Secondary | ICD-10-CM

## 2015-09-04 MED ORDER — rivaroxaban (XARELTO) 20 mg Tab
20 | ORAL_TABLET | Freq: Every day | ORAL | Status: AC
Start: 2015-09-04 — End: 2015-10-02

## 2015-09-04 MED ORDER — rivaroxaban (XARELTO) 15 mg Tab
15 | ORAL_TABLET | Freq: Two times a day (BID) | ORAL | Status: AC
Start: 2015-09-04 — End: 2015-10-02

## 2015-09-04 MED ORDER — levothyroxine (SYNTHROID) 75 MCG tablet
75 | ORAL_TABLET | Freq: Every day | ORAL | Status: AC
Start: 2015-09-04 — End: 2015-10-31

## 2015-09-04 NOTE — Unmapped (Signed)
Refill meds

## 2015-09-04 NOTE — Unmapped (Addendum)
Suspect thrombophlebitis get doppler an treat pending results recommend compression stocking esp w travel    Pt positive for dvt will start xeralto f/u 1 mo call oncologist about d/c evista stop baby asa

## 2015-09-04 NOTE — Unmapped (Signed)
Addended byGari Crown on: 09/04/2015 02:21 PM     Modules accepted: Orders, Medications

## 2015-09-04 NOTE — Unmapped (Signed)
[facility] [address]  [place] [address]    Name:  Wanda Rowe Date of Birth: [birth year] (55 y.o.)   MRN: [id number]    Date of Service:  09/04/2015    Subjective  Chief Complaint:     Chief Complaint   Patient presents with   ??? Leg Problem     x several days.  Left calf lump w/ pain, sore to the touch.  Yesterday it was red and warm to the touch.       History of Present Illness:   Wanda Rowe is a(n) 55 y.o. female here today for the following:     HPI Comments: Left calf lump since Monday morning flew on July 21 then driving trip that weekend. On evista x 1 year. Also f/u thyroid needs rf      Current Outpatient Medications:  Current Outpatient Prescriptions   Medication Sig Dispense Refill   ??? albuterol (PROVENTIL HFA;VENTOLIN HFA) 90 mcg/actuation inhaler Inhale 1-2 puffs into the lungs every 6 hours as needed. Q4-6H PRN 1 Inhaler 3   ??? aspirin 81 MG EC tablet Take 81 mg by mouth daily.     ??? blood sugar diagnostic (ACCU-CHEK AVIVA PLUS TEST STRP) Strp fsbs qd 100 strip 1   ??? blood-glucose meter (GLUCOSE MONITORING KIT) kit by Other route 2 (two) times daily. Use as instructed - fsbs bid      ??? CALCIUM CITRATE ORAL Take 1 tablet by mouth daily.       ??? cholecalciferol, vitamin D3, 2,000 unit Cap Take 1 capsule by mouth daily. 1 capsule 0   ??? conjugated estrogens (PREMARIN) vaginal cream twicwe a week 42.5 g 0   ??? FLUoxetine (PROZAC) 10 MG tablet Take 1 tablet (10 mg total) by mouth daily. 90 tablet 1   ??? fluticasone (FLONASE) 50 mcg/actuation nasal spray Use 2 sprays into each nostril daily. 16 g 5   ??? gabapentin (NEURONTIN) 600 MG tablet Take 1 tablet (600 mg total) by mouth 3 times a day. 270 tablet 1   ??? ipratropium (ATROVENT) 0.03 % nasal spray Use 2 sprays into each nostril 2 times a day. 30 mL 5   ??? LACTOBACILLUS ACIDOPHILUS (PROBIOTIC ORAL) Take 1 capsule by mouth daily.     ??? levothyroxine (SYNTHROID) 75 MCG tablet Take 1 tablet (75 mcg total) by  mouth daily. 90 tablet 1   ??? raloxifene (EVISTA) 60 mg tablet Take 60 mg by mouth daily.     ??? valACYclovir (VALTREX) 500 MG tablet TAKE 1 TABLET BY MOUTH EVERY DAY 30 tablet 0   ??? cyanocobalamin (B-12 DOTS) 500 MCG tablet Take 1 tablet (500 mcg total) by mouth daily. 30 tablet 0     No current facility-administered medications for this visit.         ROS:   Review of Systems   Constitutional: Negative for fatigue.   All other systems reviewed and are negative.         Objective:     Filed Vitals:    09/04/15 1003   BP: 110/78   Pulse: 76   Resp: 15   Height: 5' 8 (1.727 m)   Weight: 211 lb (95.709 kg)   SpO2: 98%     Body mass index is 32.09 kg/(m^2).    Physical Exam   Constitutional: She appears well-developed and well-nourished.   HENT:   Head: Normocephalic and atraumatic.   Cardiovascular:  Normal rate, regular rhythm and normal heart sounds.  Exam reveals no gallop and no friction rub.    No murmur heard.  Pulmonary/Chest: Effort normal and breath sounds normal.   Abdominal: Soft. Bowel sounds are normal.   Musculoskeletal: Normal range of motion.   b legs with varicosities. Left leg with 6 cm x 4 cm area of warmth inderation medial calf just below knee   Psychiatric: She has a normal mood and affect. Her behavior is normal.             Assessment/Plan:   Wanda Rowe was seen today for leg problem.    Diagnoses and all orders for this visit:    Anxiety and depression  -     levothyroxine (SYNTHROID) 75 MCG tablet; Take 1 tablet (75 mcg total) by mouth daily.    Vitamin D deficiency  -     levothyroxine (SYNTHROID) 75 MCG tablet; Take 1 tablet (75 mcg total) by mouth daily.    B12 deficiency  -     levothyroxine (SYNTHROID) 75 MCG tablet; Take 1 tablet (75 mcg total) by mouth daily.      No Follow-up on file.       Artis Beggs Johnn Hai, MD

## 2015-09-06 NOTE — Unmapped (Signed)
Pt was put on Xaralto - there could be an interaction with Prozac 20 mg (Onclogist bumped it from 10 - 20 mg)  Also, the Lesia Hausen is causing headaches after taking it. Within an hour.    [employer]

## 2015-09-06 NOTE — Unmapped (Signed)
Pt is booked

## 2015-09-06 NOTE — Unmapped (Signed)
Monday @ 8:50 AM

## 2015-09-06 NOTE — Unmapped (Signed)
Patient spoke to dr h today- she stated to put her in next week. Every thing is taken what day would be good to add her in, Please let me know and i will call pt

## 2015-09-06 NOTE — Unmapped (Signed)
Spoke with pt will hold prozac starting tomorrow am/took today/ has stopped evista. Has a low grade ha started yesterday an hr after taking xeralto gone this am then started after taking xeralto this am. Will hold xeralto tonight and text me tomorrow with sxs. If still having has will switch treatment. Also mention dad has coagulopathy will get specific dx and call me w it

## 2015-09-06 NOTE — Unmapped (Signed)
Spoke with pt

## 2015-09-09 ENCOUNTER — Ambulatory Visit: Admit: 2015-09-09 | Discharge: 2015-09-09 | Payer: PRIVATE HEALTH INSURANCE

## 2015-09-09 DIAGNOSIS — I824Z2 Acute embolism and thrombosis of unspecified deep veins of left distal lower extremity: Secondary | ICD-10-CM

## 2015-09-09 MED ORDER — ALPRAZolam (XANAX) 0.25 MG tablet
0.25 | ORAL_TABLET | Freq: Three times a day (TID) | ORAL | Status: AC | PRN
Start: 2015-09-09 — End: 2016-04-22

## 2015-09-09 NOTE — Unmapped (Signed)
Prn xanax due to increase bleeding risk ssri recheck 1 mo

## 2015-09-09 NOTE — Unmapped (Signed)
[facility] [address]  [address]  [place] [address]    Name:  Wanda Rowe Date of Birth: 1960-11-30 (55 y.o.)   MRN: [id number]    Date of Service:  09/09/2015    Subjective  Chief Complaint:     Chief Complaint   Patient presents with   ??? Follow-up     blood clot       History of Present Illness:   Wanda Rowe is a(n) 55 y.o. female here today for the following:     HPI Comments: Headaches better left leg superficial varicosity is softer good friend passed away boyfriend moved out 1 mo ago. A lot of job stress. 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         Current Outpatient Medications:  Current Outpatient Prescriptions   Medication Sig Dispense Refill   ??? albuterol (PROVENTIL HFA;VENTOLIN HFA) 90 mcg/actuation inhaler Inhale 1-2 puffs into the lungs every 6 hours as needed. Q4-6H PRN 1 Inhaler 3   ??? blood sugar diagnostic (ACCU-CHEK AVIVA PLUS TEST STRP) Strp fsbs qd 100 strip 1   ??? blood-glucose meter (GLUCOSE MONITORING KIT) kit by Other route 2 (two) times daily. Use as instructed - fsbs bid      ??? CALCIUM CITRATE ORAL Take 1 tablet by mouth daily.       ??? cholecalciferol, vitamin D3, 2,000 unit Cap Take 1 capsule by mouth daily. 1 capsule 0   ??? conjugated estrogens (PREMARIN) vaginal cream twicwe a week 42.5 g 0   ??? cyanocobalamin (B-12 DOTS) 500 MCG tablet Take 1 tablet (500 mcg total) by mouth daily. 30 tablet 0   ??? fluticasone (FLONASE) 50 mcg/actuation nasal spray Use 2 sprays into each nostril daily. 16 g 5   ??? gabapentin (NEURONTIN) 600 MG tablet Take 1 tablet (600 mg total) by mouth 3 times a day. 270 tablet 1   ??? ipratropium (ATROVENT) 0.03 % nasal spray Use 2 sprays into each nostril 2 times a day. 30 mL 5   ??? LACTOBACILLUS ACIDOPHILUS (PROBIOTIC ORAL) Take 1 capsule by mouth daily.     ??? levothyroxine (SYNTHROID) 75 MCG tablet Take 1 tablet (75 mcg total) by mouth daily. 90 tablet 0   ??? rivaroxaban (XARELTO) 15 mg  Tab Take 1 tablet (15 mg total) by mouth 2 times a day with meals. 42 tablet 0   ??? rivaroxaban (XARELTO) 20 mg Tab Take 1 tablet (20 mg total) by mouth daily. 30 tablet 1   ??? valACYclovir (VALTREX) 500 MG tablet TAKE 1 TABLET BY MOUTH EVERY DAY 30 tablet 0     No current facility-administered medications for this visit.         ROS:   Review of Systems   Constitutional: Negative for fatigue.   All other systems reviewed and are negative.         Objective:     Filed Vitals:    09/09/15 0859   BP: 128/84   Pulse: 80   Resp: 12   Height: 5' 8 (1.727 m)   Weight: 225 lb (102.059 kg)     Body mass index is 34.22 kg/(m^2).    Physical Exam   Constitutional: She appears well-developed and well-nourished.   HENT:   Head: Normocephalic and atraumatic.   Cardiovascular: Normal rate, regular rhythm and normal heart sounds.  Exam reveals no gallop and no friction rub.  No murmur heard.  Pulmonary/Chest: Effort normal and breath sounds normal.   Abdominal: Soft. Bowel sounds are normal.   Musculoskeletal: Normal range of motion.   Psychiatric: She has a normal mood and affect. Her behavior is normal.             Assessment/Plan:   There are no diagnoses linked to this encounter.  No Follow-up on file.       Branna Cortina Johnn Hai, MD

## 2015-09-09 NOTE — Unmapped (Signed)
xeralto f/u 1 mo off work x 1 week

## 2015-09-10 NOTE — Unmapped (Signed)
PT is calling bc this morning she woke up with pain behind her left knee. She says it wasn't there when she went to bed, and she's being treated for blood clots. She says it's pretty painful. Her knee is hard to bend, and when it's completely straight. She's wondering how concerned she should be? Please call PT.

## 2015-09-10 NOTE — Unmapped (Signed)
Pt notified.

## 2015-09-10 NOTE — Unmapped (Signed)
Shouldn't be related to blood clot since she has been on meds. I would take 2 es tylenol and see if it improves

## 2015-09-11 NOTE — Unmapped (Signed)
The issue has not gotten any better.  Right at the top of her stocking compression.  What should she do?

## 2015-09-11 NOTE — Unmapped (Signed)
Take off stocking and appt tomorrow for me to look at

## 2015-09-11 NOTE — Unmapped (Signed)
Scheduled.

## 2015-09-12 ENCOUNTER — Ambulatory Visit: Admit: 2015-09-12 | Payer: PRIVATE HEALTH INSURANCE

## 2015-09-12 DIAGNOSIS — I824Z2 Acute embolism and thrombosis of unspecified deep veins of left distal lower extremity: Secondary | ICD-10-CM

## 2015-09-12 NOTE — Unmapped (Signed)
Use tights only for travel if gets pain will have to use full length compression

## 2015-09-12 NOTE — Unmapped (Signed)
Trial xanax when gets to work

## 2015-09-12 NOTE — Unmapped (Signed)
[facility] [address]  [address]  [place] [address]    Name:  Wanda Rowe Date of Birth: July 08, 1960 (55 y.o.)   MRN: [id number]    Date of Service:  09/12/2015    Subjective  Chief Complaint:     Chief Complaint   Patient presents with   ??? Follow-up     blood clot, stopped wearing compression socks yest, pain has gotten a lot better       History of Present Illness:   Wanda Rowe is a(n) 55 y.o. female here today for the following:     HPI Comments: Leg pain resolved after taking off stockings hasnt tried xanax anxiety a little worse going back to work on monday      Current Outpatient Medications:  Current Outpatient Prescriptions   Medication Sig Dispense Refill   ??? albuterol (PROVENTIL HFA;VENTOLIN HFA) 90 mcg/actuation inhaler Inhale 1-2 puffs into the lungs every 6 hours as needed. Q4-6H PRN 1 Inhaler 3   ??? ALPRAZolam (XANAX) 0.25 MG tablet Take 1 tablet (0.25 mg total) by mouth 3 times a day as needed for Sleep. 30 tablet 0   ??? blood sugar diagnostic (ACCU-CHEK AVIVA PLUS TEST STRP) Strp fsbs qd 100 strip 1   ??? blood-glucose meter (GLUCOSE MONITORING KIT) kit by Other route 2 (two) times daily. Use as instructed - fsbs bid      ??? CALCIUM CITRATE ORAL Take 1 tablet by mouth daily.       ??? cholecalciferol, vitamin D3, 2,000 unit Cap Take 1 capsule by mouth daily. 1 capsule 0   ??? conjugated estrogens (PREMARIN) vaginal cream twicwe a week 42.5 g 0   ??? cyanocobalamin (B-12 DOTS) 500 MCG tablet Take 1 tablet (500 mcg total) by mouth daily. 30 tablet 0   ??? fluticasone (FLONASE) 50 mcg/actuation nasal spray Use 2 sprays into each nostril daily. 16 g 5   ??? gabapentin (NEURONTIN) 600 MG tablet Take 1 tablet (600 mg total) by mouth 3 times a day. 270 tablet 1   ??? ipratropium (ATROVENT) 0.03 % nasal spray Use 2 sprays into each nostril 2 times a day. 30 mL 5   ??? LACTOBACILLUS ACIDOPHILUS (PROBIOTIC ORAL) Take 1 capsule by mouth daily.     ??? levothyroxine (SYNTHROID) 75 MCG  tablet Take 1 tablet (75 mcg total) by mouth daily. 90 tablet 0   ??? rivaroxaban (XARELTO) 15 mg Tab Take 1 tablet (15 mg total) by mouth 2 times a day with meals. 42 tablet 0   ??? rivaroxaban (XARELTO) 20 mg Tab Take 1 tablet (20 mg total) by mouth daily. 30 tablet 1   ??? valACYclovir (VALTREX) 500 MG tablet TAKE 1 TABLET BY MOUTH EVERY DAY 30 tablet 0     No current facility-administered medications for this visit.         ROS:   Review of Systems   Constitutional: Negative for fatigue.   All other systems reviewed and are negative.         Objective:     Filed Vitals:    09/12/15 1538   BP: 122/80   Pulse: 76   Resp: 12   Weight: 211 lb (95.709 kg)     Body mass index is 32.09 kg/(m^2).    Physical Exam   Constitutional: She appears well-developed and well-nourished.   HENT:   Head: Normocephalic and atraumatic.   Cardiovascular: Normal rate, regular rhythm and normal heart  sounds.  Exam reveals no gallop and no friction rub.    No murmur heard.  Pulmonary/Chest: Effort normal and breath sounds normal.   Abdominal: Soft. Bowel sounds are normal.   Musculoskeletal: Normal range of motion.   Psychiatric: She has a normal mood and affect. Her behavior is normal.             Assessment/Plan:   There are no diagnoses linked to this encounter.  No Follow-up on file.       Wanda Awwad Johnn Hai, MD

## 2015-10-02 ENCOUNTER — Ambulatory Visit: Admit: 2015-10-02 | Payer: PRIVATE HEALTH INSURANCE

## 2015-10-02 DIAGNOSIS — F419 Anxiety disorder, unspecified: Secondary | ICD-10-CM

## 2015-10-02 MED ORDER — rivaroxaban (XARELTO) 20 mg Tab
20 | ORAL_TABLET | Freq: Every day | ORAL | Status: AC
Start: 2015-10-02 — End: 2015-12-09

## 2015-10-02 NOTE — Unmapped (Signed)
Prn xanax f/u psych

## 2015-10-02 NOTE — Unmapped (Signed)
[facility] [address]  [address]  [place] [address]    Name:  Wanda Rowe Date of Birth: 30-Apr-1960 (55 y.o.)   MRN: [id number]    Date of Service:  10/02/2015    Subjective  Chief Complaint:     Chief Complaint   Patient presents with   ??? Anxiety   ??? Deep Vein Thrombosis       History of Present Illness:   Wanda Rowe Wanda Rowe is a(n) 55 y.o. female here today for the following:     HPI Comments: Left leg pain better f/u anxiety use 1/2 xanax and helped entire pill made drowsy. Has appt with pysch NP for recommendations on meds. Taken off due to ha w xeralto increase risk ICH.  she sees counselor also    Anxiety            Current Outpatient Medications:  Current Outpatient Prescriptions   Medication Sig Dispense Refill   ??? albuterol (PROVENTIL HFA;VENTOLIN HFA) 90 mcg/actuation inhaler Inhale 1-2 puffs into the lungs every 6 hours as needed. Q4-6H PRN 1 Inhaler 3   ??? ALPRAZolam (XANAX) 0.25 MG tablet Take 1 tablet (0.25 mg total) by mouth 3 times a day as needed for Sleep. 30 tablet 0   ??? blood sugar diagnostic (ACCU-CHEK AVIVA PLUS TEST STRP) Strp fsbs qd 100 strip 1   ??? blood-glucose meter (GLUCOSE MONITORING KIT) kit by Other route 2 (two) times daily. Use as instructed - fsbs bid      ??? CALCIUM CITRATE ORAL Take 1 tablet by mouth daily.       ??? cholecalciferol, vitamin D3, 2,000 unit Cap Take 1 capsule by mouth daily. 1 capsule 0   ??? conjugated estrogens (PREMARIN) vaginal cream twicwe a week 42.5 g 0   ??? cyanocobalamin (B-12 DOTS) 500 MCG tablet Take 1 tablet (500 mcg total) by mouth daily. 30 tablet 0   ??? fluticasone (FLONASE) 50 mcg/actuation nasal spray Use 2 sprays into each nostril daily. 16 g 5   ??? gabapentin (NEURONTIN) 600 MG tablet Take 1 tablet (600 mg total) by mouth 3 times a day. 270 tablet 1   ??? ipratropium (ATROVENT) 0.03 % nasal spray Use 2 sprays into each nostril 2 times a day. 30 mL 5   ??? LACTOBACILLUS ACIDOPHILUS (PROBIOTIC ORAL) Take 1 capsule by  mouth daily.     ??? levothyroxine (SYNTHROID) 75 MCG tablet Take 1 tablet (75 mcg total) by mouth daily. 90 tablet 0   ??? rivaroxaban (XARELTO) 20 mg Tab Take 1 tablet (20 mg total) by mouth daily. 30 tablet 1   ??? valACYclovir (VALTREX) 500 MG tablet TAKE 1 TABLET BY MOUTH EVERY DAY 30 tablet 0     No current facility-administered medications for this visit.         ROS:   Review of Systems   Constitutional: Negative for fever.   All other systems reviewed and are negative.         Objective:     Filed Vitals:    10/02/15 1440   BP: 118/78   Pulse: 63   Resp: 15   Height: 5' 8 (1.727 m)   Weight: 214 lb (97.07 kg)   SpO2: 97%     Body mass index is 32.55 kg/(m^2).    Physical Exam   Constitutional: She appears well-developed and well-nourished.   HENT:   Head: Normocephalic and atraumatic.   Cardiovascular: Normal rate, regular  rhythm and normal heart sounds.  Exam reveals no gallop and no friction rub.    No murmur heard.  Pulmonary/Chest: Effort normal and breath sounds normal.   Abdominal: Soft. Bowel sounds are normal.   Musculoskeletal: Normal range of motion.   Psychiatric: She has a normal mood and affect. Her behavior is normal.             Assessment/Plan:   There are no diagnoses linked to this encounter.  No Follow-up on file.       Wanda Rowe Johnn Hai, MD

## 2015-10-02 NOTE — Unmapped (Signed)
Instructed pt to continue current meds

## 2015-10-02 NOTE — Unmapped (Signed)
Cont xeralto x 2 mos

## 2015-10-25 NOTE — Telephone Encounter (Signed)
Please add lab order for physical - pt is going to [facility]  Tuesday. Please send back to me and I will take downstairs.

## 2015-10-29 LAB — COMPREHENSIVE METABOLIC PANEL
A/G Ratio: 1.8 (calc) (ref 1.0–2.5)
ALT: 12 U/L (ref 6–29)
AST: 17 U/L (ref 10–35)
Albumin: 4.3 g/dL (ref 3.6–5.1)
Alkaline Phosphatase: 56 U/L (ref 33–130)
BUN: 17 mg/dL (ref 7–25)
CO2: 28 mmol/L (ref 20–31)
Calcium: 9.7 mg/dL (ref 8.6–10.4)
Chloride: 105 mmol/L (ref 98–110)
Creatinine: 0.71 mg/dL (ref 0.50–1.05)
GFR MDRD Af Amer: 112 mL/min/{1.73_m2} (ref >=60)
Globulin, Total: 2.4 g/dL (calc) (ref 1.9–3.7)
Glucose: 103 mg/dL (ref 65–99)
Potassium: 3.9 mmol/L (ref 3.5–5.3)
Sodium: 140 mmol/L (ref 135–146)
Total Bilirubin: 0.7 mg/dL (ref 0.2–1.2)
Total Protein: 6.7 g/dL (ref 6.1–8.1)
eGFR Non-Afr. American: 97 mL/min/{1.73_m2} (ref >=60)

## 2015-10-29 LAB — T4, FREE: Free T4: 1.1 ng/dL (ref 0.8–1.8)

## 2015-10-29 LAB — LDL-CHOLESTEROL: LDL Calculated: 103 mg/dL (calc)

## 2015-10-29 LAB — HOMOCYSTEINE, SERUM: Homocysteine, Cardiovascular: 9.4 umol/L (ref <10.4)

## 2015-10-29 LAB — HDL CHOLESTEROL: HDL: 66 mg/dL (ref >50)

## 2015-10-29 LAB — VITAMIN B12: Vitamin B-12: 374 pg/mL (ref 200–1100)

## 2015-10-29 LAB — CBC AND DIFFERENTIAL
Basophils Absolute: 10 cells/uL (ref 0–200)
Basophils Relative: 0.2 %
Eosinophils Absolute: 58 cells/uL (ref 15–500)
Eosinophils Relative: 1.2 %
Hematocrit: 36.2 % (ref 35.0–45.0)
Hemoglobin: 12.5 g/dL (ref 11.7–15.5)
Lymphocytes Absolute: 1392 cells/uL (ref 850–3900)
Lymphocytes Relative: 29 %
MCH: 31.6 pg (ref 27.0–33.0)
MCHC: 34.5 g/dL (ref 32.0–36.0)
MCV: 91.6 fL (ref 80.0–100.0)
MPV: 10.5 fL (ref 7.5–12.5)
Monocytes Absolute: 398 cells/uL (ref 200–950)
Monocytes Relative: 8.3 %
Neutrophils Absolute: 2942 cells/uL (ref 1500–7800)
Neutrophils Relative: 61.3 %
Platelets: 184 10*3/uL (ref 140–400)
RBC: 3.95 10*6/uL (ref 3.80–5.10)
RDW: 11.9 % (ref 11.0–15.0)
WBC: 4.8 10*3/uL (ref 3.8–10.8)

## 2015-10-29 LAB — TRIGLYCERIDES: Triglycerides: 75 mg/dL (ref <150)

## 2015-10-29 LAB — TSH: TSH: 1.09 mIU/L

## 2015-10-29 LAB — CHOLESTEROL, TOTAL: Cholesterol, Total: 185 mg/dL (ref <200)

## 2015-10-29 LAB — FERRITIN: Ferritin: 75 ng/mL (ref 10–232)

## 2015-10-29 LAB — NON-HDL CHOLESTEROL: Non HDL Chol. (LDL+VLDL): 119 mg/dL (calc) (ref <130)

## 2015-10-29 LAB — IRON: Iron: 113 ug/dL (ref 45–160)

## 2015-10-29 LAB — FOLATE: Folic Acid: 9.3 ng/mL

## 2015-10-29 LAB — CHOL/HDLC RATIO: Chol/HDL Ratio: 2.8 (calc) (ref <5.0)

## 2015-10-29 LAB — VITAMIN D 25 HYDROXY: Vit D, 25-Hydroxy: 45 ng/mL (ref 30–100)

## 2015-10-29 LAB — DHEA-SULFATE: DHEA Sulfate: 82 ug/dL (ref 8–188)

## 2015-10-29 LAB — T3: T3, Total: 97 ng/dL (ref 76–181)

## 2015-10-31 ENCOUNTER — Ambulatory Visit: Admit: 2015-10-31 | Discharge: 2015-10-31 | Payer: PRIVATE HEALTH INSURANCE

## 2015-10-31 DIAGNOSIS — F419 Anxiety disorder, unspecified: Secondary | ICD-10-CM

## 2015-10-31 MED ORDER — levothyroxine (SYNTHROID) 75 MCG tablet
75 | ORAL_TABLET | Freq: Every day | ORAL | 0 refills | Status: AC
Start: 2015-10-31 — End: 2016-02-22

## 2015-10-31 MED ORDER — folic acid-vit B6-vit B12 2.5-25-1 mg Tab
2.5-25-1 | Freq: Every day | ORAL | 3 refills | Status: AC
Start: 2015-10-31 — End: 2016-03-25

## 2015-10-31 MED ORDER — desvenlafaxine succinate (PRISTIQ) 25 mg Tb24
25 | ORAL_TABLET | Freq: Every day | ORAL | 3 refills | Status: AC
Start: 2015-10-31 — End: 2015-12-09

## 2015-10-31 MED ORDER — gabapentin (NEURONTIN) 600 MG tablet
600 | ORAL_TABLET | Freq: Three times a day (TID) | ORAL | 1 refills | Status: AC
Start: 2015-10-31 — End: 2016-04-22

## 2015-10-31 NOTE — Assessment & Plan Note (Signed)
synthroid °

## 2015-10-31 NOTE — Assessment & Plan Note (Signed)
Stop xeralto in nov

## 2015-10-31 NOTE — Assessment & Plan Note (Signed)
Vit d

## 2015-10-31 NOTE — Assessment & Plan Note (Signed)
neurontin

## 2015-10-31 NOTE — Assessment & Plan Note (Signed)
folbe for low b12/folic acid

## 2015-10-31 NOTE — Assessment & Plan Note (Signed)
Screening reviewed ordered

## 2015-10-31 NOTE — Assessment & Plan Note (Signed)
Trial pristiq

## 2015-10-31 NOTE — Progress Notes (Signed)
[facility] [address]  [address]  [place] [address]    Name:  Wanda Rowe Date of Birth: May 13, 1960 (55 y.o.)   MRN: [id number]    Date of Service:  10/31/2015    Subjective   Chief Complaint:     Chief Complaint   Patient presents with    Annual Exam       History of Present Illness:   Wanda Rowe is a(n) 55 y.o. female here today for the following:     Annual wellness f/u dvt depression thyroid rsd. Saw psych np started wellbutrin 100 mg q am not helping that much and bp has gone up. Wants to try to schedule tkr after xeralto complete        Current Outpatient Medications:  Current Outpatient Prescriptions   Medication Sig Dispense Refill    albuterol (PROVENTIL HFA;VENTOLIN HFA) 90 mcg/actuation inhaler Inhale 1-2 puffs into the lungs every 6 hours as needed. Q4-6H PRN 1 Inhaler 3    ALPRAZolam (XANAX) 0.25 MG tablet Take 1 tablet (0.25 mg total) by mouth 3 times a day as needed for Sleep. 30 tablet 0    blood sugar diagnostic (ACCU-CHEK AVIVA PLUS TEST STRP) Strp fsbs qd 100 strip 1    blood-glucose meter (GLUCOSE MONITORING KIT) kit by Other route 2 (two) times daily. Use as instructed - fsbs bid       CALCIUM CITRATE ORAL Take 1 tablet by mouth daily.        cholecalciferol, vitamin D3, 2,000 unit Cap Take 1 capsule by mouth daily. 1 capsule 0    conjugated estrogens (PREMARIN) vaginal cream twicwe a week 42.5 g 0    fluticasone (FLONASE) 50 mcg/actuation nasal spray Use 2 sprays into each nostril daily. 16 g 5    gabapentin (NEURONTIN) 600 MG tablet Take 1 tablet (600 mg total) by mouth 3 times a day. 270 tablet 1    ipratropium (ATROVENT) 0.03 % nasal spray Use 2 sprays into each nostril 2 times a day. 30 mL 5    LACTOBACILLUS ACIDOPHILUS (PROBIOTIC ORAL) Take 1 capsule by mouth daily.      levothyroxine (SYNTHROID) 75 MCG tablet Take 1 tablet (75 mcg total) by mouth daily. 90 tablet 0    rivaroxaban (XARELTO) 20 mg Tab Take 1 tablet (20 mg total)  by mouth daily. 30 tablet 3    valACYclovir (VALTREX) 500 MG tablet TAKE 1 TABLET BY MOUTH EVERY DAY 30 tablet 0    desvenlafaxine succinate (PRISTIQ) 25 mg Tb24 Take 1 tablet (25 mg total) by mouth daily. 30 tablet 3     No current facility-administered medications for this visit.          ROS:   Review of Systems   Constitutional: Negative for fatigue.   All other systems reviewed and are negative.         Objective:     Vitals:    10/31/15 0729   BP: 136/78   BP Location: Left arm   Patient Position: Sitting   BP Cuff Size: Large   Pulse: 68   Resp: 12   Weight: 213 lb (96.6 kg)   Height: 5' 8 (1.727 m)     Body mass index is 32.39 kg/m.    Physical Exam   Constitutional: She is oriented to person, place, and time. She appears well-developed and well-nourished.   HENT:   Head: Normocephalic and atraumatic.   Right Ear:  External ear normal.   Left Ear: External ear normal.   Nose: Nose normal.   Mouth/Throat: Oropharynx is clear and moist.   Eyes: Conjunctivae and EOM are normal. Pupils are equal, round, and reactive to light.   Neck: Normal range of motion. Neck supple. No thyromegaly present.   Cardiovascular: Normal rate, regular rhythm, normal heart sounds and intact distal pulses.  Exam reveals no gallop and no friction rub.    No murmur heard.  Pulmonary/Chest: Effort normal and breath sounds normal.   Abdominal: Soft. Bowel sounds are normal. She exhibits no distension and no mass. There is no tenderness.   Musculoskeletal: Normal range of motion. She exhibits no edema.   Neurological: She is alert and oriented to person, place, and time. She has normal reflexes.   Skin: Skin is warm and dry. No rash noted.   Psychiatric: She has a normal mood and affect. Her behavior is normal.             Assessment/Plan:   Wanda Rowe was seen today for annual exam.    Diagnoses and all orders for this visit:    Anxiety and depression    Vitamin D deficiency    B12 deficiency    Other orders  -     desvenlafaxine succinate  (PRISTIQ) 25 mg Tb24; Take 1 tablet (25 mg total) by mouth daily.      No Follow-up on file.       Wanda Penza Johnn Hai, MD

## 2015-11-01 NOTE — Telephone Encounter (Addendum)
Is there a specific place to get folbee OTC?

## 2015-11-01 NOTE — Telephone Encounter (Signed)
Wanda Rowe is not covered by insurance.  What can she take that is OTC?  Please call

## 2015-11-03 NOTE — Telephone Encounter (Signed)
Try homocysteine formula by source naturals

## 2015-11-04 NOTE — Telephone Encounter (Signed)
seny mychart msg

## 2015-12-05 MED ORDER — bisoprolol-hydrochlorothiazide (ZIAC) 2.5-6.25 mg per tablet
2.5-6.25 | ORAL_TABLET | Freq: Every day | ORAL | 0 refills | 75.00000 days | Status: AC
Start: 2015-12-05 — End: 2016-01-22

## 2015-12-05 NOTE — Telephone Encounter (Signed)
Pt notified.

## 2015-12-05 NOTE — Telephone Encounter (Signed)
PT has been monitoring her BP at home, her readings have been mid 130's/mid 90's. She states Dr. Rico Junker mentioned starting her on BP Meds, but then never did. She has knee surgery coming up and a pre-op here on Monday, but she's wondering if we can send in a BP med to start now to see how it's doing by Monday? She's concerned her BP may be too high and affect her surgery.      Pharmacy-[facility]

## 2015-12-09 ENCOUNTER — Ambulatory Visit: Admit: 2015-12-09 | Discharge: 2015-12-09 | Payer: PRIVATE HEALTH INSURANCE

## 2015-12-09 DIAGNOSIS — R739 Hyperglycemia, unspecified: Secondary | ICD-10-CM

## 2015-12-09 LAB — COMPREHENSIVE METABOLIC PANEL
A/G Ratio: 1.7 (calc) (ref 1.0–2.5)
ALT: 14 U/L (ref 6–29)
AST: 17 U/L (ref 10–35)
Albumin: 4.5 g/dL (ref 3.6–5.1)
Alkaline Phosphatase: 61 U/L (ref 33–130)
BUN: 23 mg/dL (ref 7–25)
CO2: 29 mmol/L (ref 20–31)
Calcium: 9.9 mg/dL (ref 8.6–10.4)
Chloride: 101 mmol/L (ref 98–110)
Creatinine: 0.67 mg/dL (ref 0.50–1.05)
GFR MDRD Af Amer: 115 mL/min/{1.73_m2} (ref >=60)
Globulin, Total: 2.6 g/dL (calc) (ref 1.9–3.7)
Glucose: 103 mg/dL (ref 65–99)
Potassium: 4.2 mmol/L (ref 3.5–5.3)
Sodium: 137 mmol/L (ref 135–146)
Total Bilirubin: 0.5 mg/dL (ref 0.2–1.2)
Total Protein: 7.1 g/dL (ref 6.1–8.1)
eGFR Non-Afr. American: 99 mL/min/{1.73_m2} (ref >=60)

## 2015-12-09 LAB — HEMOGLOBIN A1C: Hemoglobin A1C: 5.6 % of total Hgb (ref <5.7)

## 2015-12-09 MED ORDER — desvenlafaxine succinate (PRISTIQ) 25 mg Tb24
25 | ORAL_TABLET | Freq: Every day | ORAL | 3 refills | Status: AC
Start: 2015-12-09 — End: 2016-03-25

## 2015-12-09 NOTE — Assessment & Plan Note (Addendum)
Cont neurontin

## 2015-12-09 NOTE — Assessment & Plan Note (Signed)
Valtrex prn

## 2015-12-09 NOTE — Unmapped (Signed)
[facility]  [address]  [place] [address]    Name:  Wanda Rowe Date of Birth: 02/14/1960 (55 y.o.)   MRN: [id number]    Date of Service:  12/09/2015     Subjective:     Chief Complaint   Patient presents with   ??? Pre-op Exam     Left Knee Replacement on 11.30.17     History of Present Illness:  Wanda Rowe is a(n) 55 y.o. female here today for the following:  Preop consultation for left TKR    Past Medical History:  No date: Anxiety  No date: Asthma      Comment: Mild  No date: Cancer [facility])      Comment: breast  No date: Diabetes mellitus (HCC)      Comment: Type 2   resolved with gastric sleeve  No date: GERD (gastroesophageal reflux disease)  No date: Hyperlipidemia  No date: Hypertension  No date: Hypothyroidism  No date: Polyp of stomach  No date: RSD (reflex sympathetic dystrophy)      Comment: R knee    Past Surgical History:  No date: ANTERIOR CRUCIATE LIGAMENT REPAIR      Comment: L   No date: BREAST SURGERY Right      Comment: Lumpectomy chemo and radiation  No date: DENTAL SURGERY      Comment: Periodontal   07/19/2015: ESOPHAGOGASTRODUODENOSCOPY N/A      Comment: Procedure: ESOPHAGOGASTRODUODENOSCOPY WITH                COLONOSCOPY WITH MAC;  Surgeon: Lurene Shadow, MD;  Location: [facility] ENDOSCOPY;                 Service: Gastroenterology;  Laterality: N/A;  No date: FOOT SURGERY  No date: gastric sleeve  No date: HYSTERECTOMY  No date: MENISCECTOMY      Comment: L x 2, R x 1    Social History    Marital status: Single              One daughter                    Occupational History  Occupation          Engineer, structural                                    Social History Main Topics    Smoking status: Never Smoker                                                                Smokeless tobacco: Never Used                        Comment: (06/23/2006)    Alcohol use: Yes                Comment: Occasionally  (06/23/2006)    Drug use: Not on file     Sexual activity:  Not on file                Review of patient's family history indicates:    Hypertension                   Mother                    Other                          Mother                      Comment: DDD    Diabetes                       Father                      Comment: Type 2    Hypertension                   Father                    Factor VIII deficiency         Father                    Other                          Brother                     Comment: Back Pain    Diabetes                       Maternal Grandmother        Comment: Type 2    Cancer                         Maternal Grandmother        Comment: Bladder CA    Stroke                         Maternal Grandmother      Colon cancer                   Maternal Grandfather      Breast cancer                  Paternal Grandmother        Current Outpatient Prescriptions on File Prior to Visit:  albuterol (PROVENTIL HFA;VENTOLIN HFA) 90 mcg/actuation inhaler, Inhale 1-2 puffs into the lungs every 6 hours as needed. Q4-6H PRN, Disp: 1 Inhaler, Rfl: 3  ALPRAZolam (XANAX) 0.25 MG tablet, Take 1 tablet (0.25 mg total) by mouth 3 times a day as needed for Sleep., Disp: 30 tablet, Rfl: 0  bisoprolol-hydrochlorothiazide (ZIAC) 2.5-6.25 mg per tablet, Take 1 tablet by mouth daily., Disp: 30 tablet, Rfl: 0  blood sugar diagnostic (ACCU-CHEK AVIVA PLUS TEST STRP) Strp, fsbs qd, Disp: 100 strip, Rfl: 1  blood-glucose meter (GLUCOSE MONITORING KIT) kit, by Other route 2 (two) times daily. Use as instructed - fsbs bid , Disp: , Rfl:   CALCIUM CITRATE ORAL, Take 1 tablet by mouth daily.  , Disp: , Rfl:   cholecalciferol, vitamin D3, 2,000  unit Cap, Take 1 capsule by mouth daily., Disp: 1 capsule, Rfl: 0  conjugated estrogens (PREMARIN) vaginal cream, twicwe a week, Disp: 42.5 g, Rfl: 0  desvenlafaxine succinate (PRISTIQ) 25 mg Tb24, Take 1 tablet (25 mg total) by mouth daily., Disp: 30 tablet, Rfl:  3  fluticasone (FLONASE) 50 mcg/actuation nasal spray, Use 2 sprays into each nostril daily., Disp: 16 g, Rfl: 5  folic acid-vit B6-vit B12 2.5-25-1 mg Tab, Take 1 tablet by mouth daily., Disp: 90 each, Rfl: 3  gabapentin (NEURONTIN) 600 MG tablet, Take 1 tablet (600 mg total) by mouth 3 times a day., Disp: 270 tablet, Rfl: 1  ipratropium (ATROVENT) 0.03 % nasal spray, Use 2 sprays into each nostril 2 times a day., Disp: 30 mL, Rfl: 5  LACTOBACILLUS ACIDOPHILUS (PROBIOTIC ORAL), Take 1 capsule by mouth daily., Disp: , Rfl:   levothyroxine (SYNTHROID) 75 MCG tablet, Take 1 tablet (75 mcg total) by mouth daily., Disp: 90 tablet, Rfl: 0  valACYclovir (VALTREX) 500 MG tablet, TAKE 1 TABLET BY MOUTH EVERY DAY, Disp: 30 tablet, Rfl: 0      No current facility-administered medications on file prior to visit.             Current Outpatient Prescriptions   Medication Sig Dispense Refill   ??? albuterol (PROVENTIL HFA;VENTOLIN HFA) 90 mcg/actuation inhaler Inhale 1-2 puffs into the lungs every 6 hours as needed. Q4-6H PRN 1 Inhaler 3   ??? ALPRAZolam (XANAX) 0.25 MG tablet Take 1 tablet (0.25 mg total) by mouth 3 times a day as needed for Sleep. 30 tablet 0   ??? bisoprolol-hydrochlorothiazide (ZIAC) 2.5-6.25 mg per tablet Take 1 tablet by mouth daily. 30 tablet 0   ??? blood sugar diagnostic (ACCU-CHEK AVIVA PLUS TEST STRP) Strp fsbs qd 100 strip 1   ??? blood-glucose meter (GLUCOSE MONITORING KIT) kit by Other route 2 (two) times daily. Use as instructed - fsbs bid      ??? CALCIUM CITRATE ORAL Take 1 tablet by mouth daily.       ??? cholecalciferol, vitamin D3, 2,000 unit Cap Take 1 capsule by mouth daily. 1 capsule 0   ??? conjugated estrogens (PREMARIN) vaginal cream twicwe a week 42.5 g 0   ??? desvenlafaxine succinate (PRISTIQ) 25 mg Tb24 Take 1 tablet (25 mg total) by mouth daily. 30 tablet 3   ??? fluticasone (FLONASE) 50 mcg/actuation nasal spray Use 2 sprays into each nostril daily. 16 g 5   ??? folic acid-vit B6-vit B12 2.5-25-1 mg Tab  Take 1 tablet by mouth daily. 90 each 3   ??? gabapentin (NEURONTIN) 600 MG tablet Take 1 tablet (600 mg total) by mouth 3 times a day. 270 tablet 1   ??? ipratropium (ATROVENT) 0.03 % nasal spray Use 2 sprays into each nostril 2 times a day. 30 mL 5   ??? LACTOBACILLUS ACIDOPHILUS (PROBIOTIC ORAL) Take 1 capsule by mouth daily.     ??? levothyroxine (SYNTHROID) 75 MCG tablet Take 1 tablet (75 mcg total) by mouth daily. 90 tablet 0   ??? valACYclovir (VALTREX) 500 MG tablet TAKE 1 TABLET BY MOUTH EVERY DAY 30 tablet 0     No current facility-administered medications for this visit.       Review of Systems   Constitutional: Negative for fatigue.   All other systems reviewed and are negative.           Objective:     Vitals:    12/09/15 1214   BP: 124/74  BP Location: Left arm   Patient Position: Sitting   BP Cuff Size: Large   Pulse: 68   Weight: (!) 247 lb (112 kg)   Height: 5' 8 (1.727 m)     Body mass index is 37.56 kg/m??.    Physical Exam   Constitutional: She appears well-developed and well-nourished.   HENT:   Head: Normocephalic and atraumatic.   Cardiovascular: Normal rate, regular rhythm and normal heart sounds.  Exam reveals no gallop and no friction rub.    No murmur heard.  Pulmonary/Chest: Effort normal and breath sounds normal.   Abdominal: Soft. Bowel sounds are normal.   Musculoskeletal: Normal range of motion.   Psychiatric: She has a normal mood and affect. Her behavior is normal.            Assessment/Plan:     There are no diagnoses linked to this encounter.  No Follow-up on file.       Man Bonneau Johnn Hai, MD

## 2015-12-09 NOTE — Assessment & Plan Note (Signed)
Cont synthroid

## 2015-12-09 NOTE — Assessment & Plan Note (Signed)
Cont pristiq

## 2015-12-09 NOTE — Telephone Encounter (Signed)
PT is calling bc she needs her weight corrected to 214.7 and then resend her pre-op.

## 2015-12-09 NOTE — Assessment & Plan Note (Signed)
Ok for planned procedure

## 2015-12-09 NOTE — Telephone Encounter (Signed)
Changed and LM letting pt know and I did apologize for making the mistake

## 2015-12-09 NOTE — Assessment & Plan Note (Signed)
Cont ziac

## 2015-12-09 NOTE — Assessment & Plan Note (Signed)
Cont d

## 2015-12-09 NOTE — Assessment & Plan Note (Addendum)
Dx on 09/04/15. xeralto discontinued getting f/u doppler

## 2015-12-16 NOTE — Unmapped (Signed)
^  RTF^^base64^e1xydGYxXHNzdGUxNjAwMFxhbnNpXGRlZmxhbmcxMDMzXGZ0bmJqXHVjMVxkZWZmMA0Ke1xmb250dGJse1xmMCBcZnN3aXNzIEFyaWFsO317XGYxIFxmc3dpc3Mg XGZjaGFyc2V0MCBBcmlhbDt9e1xmMiBcZnN3aXNzIFxmY2hhcnNldDIgU3ltYm9sO317XGYzIFxmc3dpc3Mg XGZjaGFyc2V0MiBXaW5nZGluZ3M4fXtcZ  jQgXGZuaWwgQ291cmllciBOZXc50fX0NCntcY29sb3J0YmwgO1xyZWQyNTVcZ3JlZW4yNTVcYmx1ZTI1NSA7XHJlZDc5XGdyZWVuNzlcYmx1ZTc5IDtccmVkOTVcZ3JlZW45NVxibHVlOTUgO1xyZWQwXGdyZWVuMFxibHVlMCA7XHJlZDBcZ3JlZW4wXGJsdWUxNjAgO1xyZWQxMDJcZ3JlZW4xMDJcYmx1ZTEwMiA7XHJlZDEwMFxncmVlbjE  wMFxibHVlMTAwIDtccmVkMFxncmVlbjBcYmx1ZTI1NSA7XHJlZDEyOFxncmVlbjBcYmx1ZTEyOCA7XHJlZDI1NVxncmVlbjBcYmx1ZTAgO1xyZWQyMjFcZ3JlZW4yMjFcYmx1ZTIyMSANCjtccmVkMjM5XGdyZWVuMjM5XGJsdWUyMzkgO1xyZWQxOTFcZ3JlZW4xOTFcYmx1ZTE5MSA7XHJlZDIzOFxncmVlbjIzOFxibHVlMjM4IDt9DQp7X  HN0eWxlc2hlZXR7XGYwXGZzMjQgTm9ybWFsO317XGNzMSBEZWZhdWx0IFBhcmFncmFwaCBGb250O317XHMyXHNuZXh0MCBoZWFkaW5nIDE69fXtcczNcc25leHQwIGhlYWRpbmcgMjt9e1xzNFxzbmV4dDAgaGVhZGluZyAzO317XHM1XHNuZXh0MCBoZWFkaW5nIDQ7fXtcczZcc25leHQwIGhlYWRpbmcgNTt9e1xzN1xzbmV4dDAgaGVhZGl  uZyA2O317XHM4XHNuZXh0MFxjZjRcY2IxXGNoY2JwYXQxXGZpMFxsaTBccmkwXHNiMFxzYTBcc2wwIA0KO317XHM5XHNuZXh0MFxmczI0XGNmNFxjYjFcY2hjYnBhdDFcZmkwXGxpMFxyaTBcc2IwXHNhMFxzbDAgaGVhZGVyO317XHMxMFxzbmV4dDBcZnMyNFxjZjRcY2IxXGNoY2JwYXQxXGZpMFxsaTBccmkwXHNiMFxzYTBcc2wwIGZvb  3Rlcjt9e1xzMTFcc25leHQwXGNmNFxjYjFcY2hjYnBhdDFcZmkwXGxpMFxyaTBcc2IwXHNhMFxzbDAgRGVmaW5pdGlvbiBUZXJtO317XHMxMlxzbmV4dDBcY2Y0XGNiMVxjaGNicGF0MVxmaTBcbGkzNjBccmkwXHNiMFxzYTBcc2wwIERlZmluaXRpb24gTGlzdDt9e1xjczEzXGlcY2Y0XGNiMVxjaGNicGF0MSBEZWZpbml0aW9uO317XHM  xNFxzbmV4dDBcZnM0OFxiXGNmNFxjYjFcY2hjYnBhdDFcZmkwXGxpMFxyaTBcc2IxMDBcc2ExMDBcc2wwXGtlZXBuIEgxDQo35fXtcczE1XHNuZXh0MFxmczM2XGJcY2Y0XGNiMVxjaGNicGF0MVxmaTBcbGkwXHJpMFxzYjEwMFxzYTEwMFxzbDBca2VlcG4gSDI7fXtcczE2XHNuZXh0MFxmczI4XGJcY2Y0XGNiMVxjaGNicGF0MVxmaTBcb  GkwXHJpMFxzYjEwMFxzYTEwMFxzbDBca2VlcG4gSDM48fXtcczE3XHNuZXh0MFxmczI0XGJcY2Y0XGNiMVxjaGNicGF0MVxmaTBcbGkwXHJpMFxzYjEwMFxzYTEwMFxzbDBca2VlcG4gSDQ7fXtcczE4XHNuZXh 0MFxmczIwXGJcY2Y0XGNiMVxjaGNicGF0MVxmaTBcbGkwXHJpMFxzYjEwMFxzYTEwMFxzbDBca2VlcG4gSDU72fXtcczE5XHN  uZXh0MFxmczE2XGJcY2Y0XGNiMVxjaGNicGF0MVxmaTBcbGkwXHJpMFxzYjEwMFxzYTEwMFxzbDBca2VlcG4gSDYNCjt9e1xzMjBcc25leHQwXGlcY2Y0XGNiMVxjaGNicGF0MVxmaTBcbGkwXHJpMFxzYjBcc2EwXHNsMCBBZGRyZXNzO317XHMyMVxzbmV4dDBcY2Y0XGNiMVxjaGNicGF0MVxmaTBcbGkzNjBccmkzNjBcc2IxMDBcc2ExM  DBcc2wwIEJsb2NrcXVvdGU29fXtcY3MyMlxpXGNmNFxjYjFcY2hjYnBhdDEgQ0lURTt9e1xjczIzXGY0XGZzMjBcY2Y0XGNiMVxjaGNicGF0MSBDT0RFO317XGNzMjRcaVxjZjRcY2IxXGNoY2JwYXQxIEVtcGhhc2lzO317XGNzMjVcdWxcY2Y4XGNiMVxjaGNicGF0MSBIeXBlcmxpbms7fXtcY3MyNlx1bFxjZjlcY2IxXGNoY2JwYXQxIEZ  vbGxvd2VkSHlwZXJsaW5rO317XGNzMjdcZjRcZnMyMFxiXGNmNFxjYjFcY2hjYnBhdDEgS2V5Ym9hcmQNCjt9e1xzMjhcc25leHQwXGY0XGZzMjBcY2Y0XGNiMVxjaGNicGF0MVxmaTBcbGkwXHJpMFxzYjBcc2EwXHNsMCBQcmVmb3JtYXR0ZWQ43fXtcczI5XHNuZXh0MFxmMFxmczE2XHZcY2Y0XGNiMVxjaGNicGF0MVxicmRydFxicmRyZ  GJcZmkwXGxpMFxyaTBcc2IwXHNhMFxzbDBccWMgei1Cb3R0b20gb2YgRm9ybTt9e1xzMzBcc25leHQwXGYwXGZzMTZcdlxjZjRcY2IxXGNoY2JwYXQxXGJyZHJiXGJyZHJkYlxmaTBcbGkwXHJpMFxzYjBcc2EwXHNsMFxxYyB6LVRvcCBvZiBGb3JtO317XGNzMzFcZjRcY2Y0XGNiMVxjaGNicGF0MSBTYW1wbGU22fXtcY3MzMlxiXGNmNFx  jYjFcY2hjYnBhdDEgU3Ryb25nDQo30fXtcY3MzM1xmNFxmczIwXGNmNFxjYjFcY2hjYnBhdDEgVHlwZXdyaXRlcjt9e1xjczM0XGlcY2Y0XGNiMVxjaGNicGF0MSBWYXJpYWJsZTt9e1xjczM1XHZcY2YxMFxjYjFcY2hjYnBhdDEgSFRNTCBNYXJrdXA7fXtcY3MzNlx2XGNmNFxjYjFcY2hjYnBhdDEgQ29tbWVudDt9e1xzMzdcc25leHQwX  GZzMjRcY2Y0XGNiMVxjaGNicGF0MVxmaTBcbGkwXHJpMFxzYjEwMFxzYTEwMFxzbDAgTm9ybWFsIChXZWIpO317XGNzMzhcZjRcZnMyMFxjZjRcY2IxXGNoY2JwYXQxIEhUTUwgS2V5Ym9hcmQ5fXtcY3MzOVxjZjRcY2IxXGNoY2JwYXQxIEhlYWRlciBDaGFyO317XGNzNDBcY2Y0XGNiMVxjaGNicGF0MSBGb290ZXIgQ2hhcg0KO319DQp  7XCpccmV2dGJse1Vua25vd279fXtVbmtub3duO319DQp7XCpcbGlzdHRhYmxlDQp7XGxpc3RcbGlzdHRlbXBsYXRlaWQtMQ0Ke1xsaXN0bGV2ZWxcbGV2ZWxuZmMyNTVcbGV2ZWxmb2xsb3cwXGxldmVsc3RhcnRhdDF7XGxldmVsdGV4dCBcJzAwfXtcbGV2ZWxudW1iZXJzfX0NCntcbGlzdGxldmVsXGxldmVsbmZjMjU1XGxldmVsZm9sb  G93MFxsZXZlbHN0YXJ0YXQxe1xsZXZlbHRleHQgXCcwMH17XGxldmVsbnVtY mVyc319DQp7XGxpc3RsZXZlbFxsZXZlbG23mYzI1NVxsZXZlbGZvbGxvdzBcbGV2ZWxzdGFydGF0MXtcbGV2ZWx0ZXh0IFwnMDB9e1xsZXZlbG51bWJlcnN9fQ0Ke1xsaXN0bGV2ZWxcbGV2ZWxuZmMyNTVcbGV2ZWxmb2xsb3cwXGxldmVsc3RhcnRhdDF7XGx  ldmVsdGV4dCBcJzAwfXtcbGV2ZWxudW1iZXJzfX0NCntcbGlzdGxldmVsXGxldmVsbmZjMjU1XGxldmVsZm9sbG93MFxsZXZlbHN0YXJ0YXQxe1xsZXZlbHRleHQgXCcwMH17XGxldmVsbnVtYmVyc319DQp7XGxpc3RsZXZlbFxsZXZlbG81mYzI1NVxsZXZlbGZvbGxvdzBcbGV2ZWxzdGFydGF0MXtcbGV2ZWx0ZXh0IFwnMDB9e1xsZXZlb  G51bWJlcnN42fQ0Ke1xsaXN0bGV2ZWxcbGV2ZWxuZmMyNTVcbGV2ZWxmb2xsb3cwXGxldmVsc3RhcnRhdDF7XGxldmVsdGV4dCBcJzAwfXtcbGV2ZWxudW1iZXJzfX0NCntcbGlzdGxldmVsXGxldmVsbmZjMjU1XGxldmVsZm9sbG93MFxsZXZlbHN0YXJ0YXQxe1xsZXZlbHRleHQgXCcwMH17XGxldmVsbnVtYmVyc319DQp7XGxpc3RsZXZ  lbFxsZXZlbG48mYzI1NVxsZXZlbGZvbGxvdzBcbGV2ZWxzdGFydGF0MXtcbGV2ZWx0ZXh0IFwnMDB9e1xsZXZlbG51bWJlcnN9fQ0Ke1xsaXN0bmFtZSA7fVxsaXN0aWQxMDAwMDAwMDAxDQp9DQp9DQp7XCpcbGlzdG92ZXJyaWRldGFibGUNCntcbGlzdG92ZXJyaWRlXGxpc3RpZDEwMDAwMDAwMDFcbGlzdG92ZXJyaWRlY291bnQwXGxzM  X0NCn0NClxwYXBlcncxMjI0MFxwYXBlcmgxNTg0MFxtYXJnbDg2NFxtYXJncjU3NlxtYXJndDcyMFxtYXJnYjcyMFxoZWFkZXJ5NzIwXGZvb3Rlcnk3MjBcbm9ncm93YXV0b2ZpdFxkZWZ0YWI3MjBcZm9ybXNoYWRlXGZldDRcYWVuZG5vdGVzXGFmdG5ucmxjXHBnYnJkcmhlYWRccGdicmRyZm9vdA0KXHNlY3RkXHBnd3N4bjEyMjQwXHB  naHN4bjE1ODQwXHRpdGxlcGdcbWFyZ2xzeG44NjRcbWFyZ3JzeG41NzZcbWFyZ3RzeG43MjBcbWFyZ2JzeG43MjBcaGVhZGVyeTcyMFxmb290ZXJ5NzIwXHNia3BhZ2VccGduY29udFxwZ25kZWMNClxwbGFpblxwbGFpblxmMFxmczI0DQp7XGhlYWRlcg0KXHRyb3dkXHRyZ2FwaDBcbGFzdHJvd1x0cmxlZnQwDQpcY2x2ZXJ0YWx0XGNsY  nJkcmJcYnJkcnNcYnJkcncyMFxicmRyY2Y0XGNlbGx4MzU2NA0KXGNsdmVydGFsdFxjbGJyZHJiXGJyZHJzXGJyZHJ3MjBcYnJkcmNmNFxjZWxseDYyNjQNClxjbHZlcnRhbHRcY2xicmRyYlxicmRyc1xicmRydzIwXGJyZHJjZjRcY2VsbHgxMDgwMA0KXHBhcmRcaW50YmxcczBcc2wyNFxxbFxwbGFpblxmMFxmczI0XGhpY2hcZjBcZGJ  jaFxmMFxsb2NoXGYwDQp7XHBpY3RcanBlZ2JsaXAwXHBpY3cxODBccGljaDY5XHBpY3dnb2FsMjcwMFxwaWNoZ29hbDEwMzVccGljc2NhbGV4MTAwXHBpY3NjYWxleTEwMFxzc3BpY2FsaWduMCBmZmQ4ZmZlMDAwMTA0YTQ2NDk0NjAwMDEwMTAxMDA2MDAwNjAwMDAwZmZkYjAwNDMwMDA4M DYwNjA3MDYwNTA4MDcwNzA3MDkwOTA4MG EwY  zE0MG QwYzBiMGIwYzE5MTIxMzBmMTQxZDFhMWYxZTFkMWExYzFjMjAyNDJlMjcyMDIyMmMyMzFjMWMyODM3MjkyYzMwMzEzNDM0MzQxZjI3MzkzZDM4MzIzYzJlMzMzNDMyZmZkYjAwNDMwMTA5MDkwOTBjMGIwYzE4MG QwZDE4MzIyMTFjMjEzMjMyMzIzMjMyMzIzMjMyMzIzMjMyDQozMjMyMzIzMjMyMzIzMjMyMzIzMjMyMzIzMjMyMzI  zMjMyMzIzMjMyMzIzMjMyMzIzMjMyMzIzMjMyMzIzMjMyMzIzMjMyMzIzMjMyMzJmZmMwMDAxMTA4MDA0NTAwYjQwMzAxMjIwMDAyMTEwMTAzMTEwMWZmYzQwMDFmMDAwMDAxMDUwMTAxMDEwMTAxMDEwMDAwMDAwMDAwMDAwMDAwMDEwMjAzMDQwNTA2MDcwODA5MG EwYmZmYzQwMGI1MTAwMDAyMDEwMzAzMDIwNDAzMDUwNTA0MDQwMDAwM  DE3ZDAxMDIwMzAwMDQxMTA1MTIyMTMxNDEwNjEzNTE2MTA3MjI3MTE0MzI4MTkxYTEwODIzNDJiMWMxMTU1MmQxZjAyNDMzNjI3MjgyMDkwYTE2MTcxODE5MWEyNTI2MjcyODI5MmEzNDM1MzYzNzM4DQozOTNhNDM0NDQ1NDY0NzQ4NDk0YTUzNTQ1NTU2NTc1ODU5NWE2MzY0NjU2NjY3Njg2OTZhNzM3NDc1NzY3Nzc4Nzk3YTgzODQ4NTg  2ODc4ODg5OGE5MjkzOTQ5NTk2OTc5ODk5OWFhMmEzYTRhNWE2YTdhOGE5YWFiMmIzYjRiNWI2YjdiOGI5YmFjMmMzYzRjNWM2YzdjOGM5Y2FkMmQzZDRkNWQ2ZDdkOGQ5ZGFlMWUyZTNlNGU1ZTZlN2U4ZTllYWYxZjJmM2Y0ZjVmNmY3ZjhmOWZhZmZjNDAwMWYwMTAwMDMwMTAxMDEwMTAxMDEwMTAxMDEwMDAwMDAwMDAwMDAwMTAyMDMwN  DA1MDYwNzA4MDkwYTBiZmZjNDAwYjUxMTAwMDIwMTAyMDQwNDAzMDQwNzA1MDQwNDAwMDEwMjc3MDAwMTAyMDMxMTA0DQowNTIxMzEwNjEyNDE1MTA3NjE3MTEzMjIzMjgxMDgxNDQyOTFhMWIxYzEwOTIzMzM1MmYwMTU2MjcyZDEwYTE2MjQzNGUxMjVmMTE3MTgxOTFhMjYyNzI4MjkyYTM1MzYzNzM4MzkzYTQzNDQ0NTQ2NDc0ODQ5NGE  1MzU0NTU1NjU3NTg1OTVhNjM2NDY1NjY2NzY4Njk2YTczNzQ3NTc2Nzc3ODc5N2E4MjgzODQ4NTg2ODc4ODg5OGE5MjkzOTQ5NTk2OTc5ODk5OWFhMmEzYTRhNWE2YTdhOGE5YWFiMmIzYjRiNWI2YjdiOGI5YmFjMmMzYzRjNWM2YzdjOGM5Y2FkMmQzZDRkNWQ2ZDdkOGQ5ZGFlMmUzZTRlNWU2ZTdlOGU5ZWFmMmYzZjRmNWY2ZjdmOGY5Z  mFmZmRhMDAwYzAzMDEwMDAyMTEwMzExDQowMDNmMDBmNmRkNWI1ZmQyZjQzZjI3ZmI0YWVkNmRmY2VkZGU1ZWU1Mjc3NjMxOWU4MG ZhOGFjZGZmMDA4NGZiYzJmZjAwZjQxNzhiZmVmODdmZjBhZTRiZTMxN2ZjYzE3ZmVkYmZmZWQzYWYyZGFmNWYwYjk3ZDNhZDQ5NGU0ZGRkZmYwMDk5ZTJlMmYzMmFiNDZiM2E3MTRhY2JmYzhmN2ZmZjA  wODRmYmMyZmYwMGY0MTc4YmZlZjg3ZmYwYWQwZDJiYzQ3YTRlYjcyYzkxZTlkN2E5NzBmMThkY2UxNTU4NjA3ZTIyYmU3MG FmNDdmODQxZmYwMDIxNWQ0YmZ lYjgyZmZlODU0ZjEzOTc1M2E1NDljZDM3NzQ0ZTE3MzNhYjVhYjQ2OWM5MmIzZmViYjllYjk0NTE0NTc4ZTdiODE0NTE0NTAwMTQ1MTQ1DQowMDE0NTE0NTAwMTQ1MTQ1MDAxN  DUxNDUwMDE0NTE0NTAwMTQ1MTQ1MDAxNDUxNDUwMDE0NTE0NTAwNzk3N2M2MmZmOTgyZmYwMGRiN2ZmZGE3NWU1YjVlYTVmMThiZmU2MG JmZjZkZmZmMDA2OWQ3OWFkOGRiYzc3NzdkMGRiY2IzYWRiYTQ4ZGI0Y2FjYTQ4NWY0YzgxZWY1ZjRiODA3NmMzNDVmYWZlNmNmOTRjYzk1ZjE3MjRiY2JmMjQ2OGU4NWUxY2JmZDc4NWQzNTlkYjN  jYWI2ZjExNjI0NzAwYjYzODVmNzNlZDVkN2ZjMjI1MmJhYmVhNmFjMDg2MTBhODIwOGU0MWRkNWU4OWUxYmQwMmRmYzM3YTNjNzYxMDFkZWQ5ZGYyYzg0NjBiYjllYTdmOTBmYTBhODc0ZWYwZDQxYTVmODkyZmYwMDU2DQpiNjdjMmRlYTBkZjBlM2VlYmU3MjQ4M2VmZTllYmZhNzA1N2M3YWFiMTljM2E3NDNkMmMzZTVkMmE1MmE3NTNhY  WRjM2M0YzZlYTFiNTg2ZTJkYWZlZTJkZGJjZThhMjJiMWVjYzEwY2UwMTNjYTllNzA2YjJlMmQ2NmZmMDA0YWQ0ZWVlMzljYjVkZDkyZGUyNWJmOTkyNDlmYmQ1MjYyMDc4NTAwMGM2NDY0ZjRlYmQyYjZmYzQzYWJlOWRhM2U5YzI3ZDQxMDRhYTVjNzk3MG VkMGM1ZGM3MjMwMGZhNzVjZjZhYzZiM2Q1NzUzYmJiYzgxZTVmMDgwOGFkZTc  5ZDFjY2FlZWJiOTBmMDA0ODQ2ZGNlNDBmZmYwMDVkNzkyN2IyNjk3ODdiNWRiOWQ2NzczY2Q2MjYxODVhMzU5NjI5MDZlZGE0MTI3ZTUyDQo0ODE5MjMxZDQ3MWY5NTZlZDcyYmUwZWJlYjdiYzdkNGU0NGQzZWNlY2RhM2I4ZjI4YjQwOWI3N2Y1YzY2YmFhYTAwMjhhY2NiZGQ0NmY2MG Q1MmNhZGFkYjRlNmI4Yjc5OTg4OWE3NTdjMDg3M  WViNWE1Yjk0MzA1ZGMzNzFlODMzY2QwMDJkMTU5ZjA2YjM2NTczYWM1YzY5NzEzYjM1Y2RiYTZmOTA2ZDIwMmY0ZWZkZmE4YWFmZTI1ZDc3ZmUxMWRkMjg1ZWZkOWJlZDE5OTA0N2IzN2VjZWEwZjM5YzFmNGEwMGQ4YTJiMzc1ZWQ1N2ZiMTM0NWI4ZDQ3YzlmM2JjOWRiZmJiZGZiNzM5NjBiZDcwN2Q2YWM1ODVlMGJkZDJlZDZmNGE4OGM  0ZjBhNGE1NGI2NzZlZTUwNzE5ZmM2ODAyZDUxNDAyMDgwDQo0MWM4MzQ4MTgzNjcwNDFjMWMxYzFlODY4MDE2OGE0ZGMwMzA1MjQ2NGY0MTlhY2I3ZDRlZWUzZDZlNGI1OTM0Zjc1YjA4ZTAzMjliY2RkYzY0NzZjNjNmZmFmNDAxYWI0NTUyZDJiNTViNWQ2NmNmZWQ3NjZjY2QwZWUyYTE5OTcxOTIzZDhkNWMwY2FkOWRhYzBlMGUwZTBmN  DM0MDBiNDU3MzFhMmViZmE5NmI1YTJkZDVkYzU2ZjA3OWYwZGNiYzQyMzAwOTBjMDI4MjNiZjA3MjZiN2I0ZjlhZTY3YjM0OTJlZWRmZWNmMzMxNmNjNTljZWQxOTM4ZTdkNzE4Y2ZiZDAwNTlhMjhhMjgwM2NiYmUzMTdmY2MxN2ZlZGJmZjAwZWQzYWYzZmYwMDBmZTljZGFiNmJiNjk2NDkyYTQ0ZDI0ODMwNWYzZGI5DQoyMDdiZTMzNWV  jM2UzYWQxYWMzNTZmYjA3Z GI4NWNmZWViY2NkOWU0NGFhOWQ3NmU3Mzk1NmNmNDFlOTVjZGU5N2UxZmQxYjQ4ZDRlZGVmZWRkMmZkYTU4MWY3YTg5MmUxMGE5M2VmODhjNTc0MmUyMmMwNjEyOTdkNWVhY2VkMzVlNGRlZmFhZTllNjc5OTViMjVjNTYyNzExZWRhMTFiYzVkYmFhZTlhMzM1ZmM1OTc3NmMzNWRiZDg2N2JmZDQyMjMxNjk2N  WZjYjgyMzUyYTgwYjhmOTgxZGUzZTdlOWRiZjFhZWI3NDJiODE3N2EwZDg1YzA5NjQ5N2NjODEwZjk5MmE4NTc3ZTNhYjAwNGYyN2ViNWNjNWQ2OTUxNzg4ZWY2ZWVmY2Q5ZGQ2ZTk2ZDBkYWJlYzlkNDJlZGNlNzhjYTFmOWFiNmZjMzc3DQowODk2OTFlOTkxNDMyNDY5NjkxMmEyOTkxZjczMTAzOGU3ODFjZDcxZmYwMDZhNjEyYjQyOWM  yOWM5ZGRlZGVlYjQ5ZTlhZDlkOGVmODYwYjExNGVhY2U3MzRhZGViZTdlYTYwZjhkMWQyMGYxNTc4NzY3YmJmZjhmMjU5M2U2MmRmNzQxZGMzOTNmYTdlNTVkYzgyMDgwNDEwNDFlODQ1NTRkNGY0YWIzZDYyY2NkYWRmNDIyNDg4OWM4ZWM1NGZhODNkOGQ2MWQ4ZjgxYWM2YzJlZTA5YTNiZmQ0NTk2MDcxMjQ3MG JjYzM2MDIwZTdhNjNhN  TY4Njg3MjVhMmY4N2FkNzU5YjFmMTA0ZDcwZDJlZjgyNTczMDg1N2MyYWI2MDljZTNiZjYxZjRhOGVlNjRiOGQ0M2MyNWUxOWRmM2IwOWZlZDhkMTI0ZDlmDQo5OTdlNmMwMzlmNmUzZjJhZjQyZDMzYzM5NjdhNTQxN2QwYzEyNGVjYjc4YzVhNDJlYzA5MDQ4MjM4YzAxZWI1NTUzYzFiYTcyNTg1ODU5ODlhZWJjYmIxOWNjZjE5MmNiOTJ  kOWNlMGZjYmQzZTk4YTAwYzBiZWQyMmRiNDRmMTNmODYyY2VkNGM4NjMxMmM4ZDk5MWIyNDkzOGNkNjdiNWEyNjhkZTIxNzlmYzRiYTc0ZjM5OWFlYzNjMTdlOTI5MmEzOWM4MDU3MzhjN2IxZTcxZDA1NzdkN2RhMjViNmExYWE1OGVhMTJiY2FiMzU5MTI2MzU0MjAyOWNmYWYxZWRlZDU5NjNjMGY2MG Q3MzFjOTcxN2JhODVjYzUxYmY5O  Dk2ZjNjZmJhMzBkZjRjNTAwNzM5YTNmODdiNGU5M2UyMGVhNTZhZDBiMThhDQpkMDJjZDA4ZGU3ODZjYTljZmJmNTM1YjFmMTJmZmU0NTY1ZmYwMGFmOTRmZTRkNWE5NzNlMTRiMWI4ZDdkMzU5NTlhZTYwYjkwY2FjZTIyNzAxNjRkYjhjMDYxOGU5YzBjZmQyYWRlYjlhMjViNmJmNjAyY2VlOWU1NDhjMzg3Y2M0NDAzOTE5ZjUwN2Q2ODA  zODlmMTQ2OTNlMjRiNmYwZWRkNGQ3ZmFlMjVjZGFhZWNkZjA4ODgwZGRmM2E4MWNlM2Q3MDdmMGE4YWNlMTVkN2Y1ZGQyMzQ4YmY5MWZlYzE2ZmE2NDMyMmMwYWM1NDQ4ZGU1YTllNzFmNWZkM2ViNWU4MWFiZTk3MDZiM2E1Y2RhN2RjYjQ4YjE0YmI3NzE4YzgwYzMwYzFiOGM4M2RjNTY1ZGVmODNiNGViYjg2YzgyY2I3MzA1YzU5YzRiD  QowYzU3MzBjOWI2NGRhYTMwMzI3MWNkMDA3MjcxZDlkZmRiZWFmZTIwZjBkNjhiNzEyNzkyNjAxMjQyYTY0ZmI4NzI4NGE4M2RiMjE5OTdmMmNkNTViN2ZlY2ViM2QxYjU1YjE3ZDMyN2IwZDVkNmM3MTIwOTI0MmNiMjgwNDY1ODY3YTEzZ Dc4ZTJiYjY4M2MxYmE3NWJlOWY3MzZiMWNiNzYyNGI5NjBkMmRkNzliZmJlMjQxYzhmOWIxZWJ  lZDU5YmFhNzg0ZTBkM2Y0NGQ1NmU2Mjk2ZjZmYWZlNWI3ZjJjNDkzYmY5OGY4YzhlMDYwN2I1MDA3M2YyZTgxNjg5ZTAxODc1ZWZiNGNmZmRhMzFhMjMyNGM2NTNmMmUxODI4NTAzYjYwNzAzZTlmODUzZTM0MWFlZWJhZmY2ZTUyZTY3ZDE1MjQ3YzEyMzJjDQowMmIwM2M3YjgwNmI1MzQzZjAzNTk1ZGU4ZDYzMmRlY2I3ZWExOTQzYzk2O  DY0ZGE5YmJkZDcxOTE1ZDNjN2UxZmIzOGY1YjNhYWE5OTA0YTYwZmIzZjk3OTFlNTg0ZTNiNjMzZGJkNjgwMzgyZjBmZGI2OGY2MWUwOGI5ZDVlZjYzOThjOTJlZWI2N2YyOWM4NjcwNTk0ODUxZDg3NDFjZmE1MWE2YzMzNjk3ZTJmZDFlNGI3ZDMxZjRhODZmMDE1MzA5YjkzMjk5MTdkNWIzZDNhOGUyYmE3OGJlMWZlOTMxNDc3MzA4OWV  mMGRiY2UzMWU0OTk0NmQ4Y2U0MWRjYmM3NTE4MDMyNzNjN2FkNGI2YmUwOGIwYjZiZGI0YmMzN2I3ZjM1YzVhYjAyOGQyY2ExYjIwNzQ1ZTlkMDdhMGM3NTM0MDE1M2UxYzdmDQpjODBlZmJmZWJmZTRmZjAwZDA1MmJiMWFjZGQxMzQ0YjZkMDZkNjViN2I1Nzk1ZDI1OThjY2M2NTIwOWM5MDA3NjAzOGUyYjRhODAwYTI4YTI4MDM5ZGYxN  TdmY2JhN2ZjMGZmMDBmNjVhYWZhNTY4MmY3Mzg5YWU4MTQ4N2E4NWU4NWJmYzA1NzQ3MzVhNDM3MTM0NTJjYTliY2M1OWRhMGY0YzljNzNmYTU0ZjVlMzRmMjg4NTZjNjRiMTE1YjU1YTU5N2EyNWJmZjkxZGFiMTkyODUxNTRlMWJmN2Y5OGQ4ZTM0OGEzNThlMzUwYThhMzAwMDFjMGE2YWMxMTJjZWQzMmEwMTIzMGMzMzBlZTJhNGEyYmQ  3ZTQ4ZTlhNmM3MWRkOTg3ZTI2ZDUyNmQyNjFkM2E2ODk5YzJiZGYyNDcyYWE0N2JkOWQwYWIxDQoyMDBjN2IwZTljZDY0YTc4Y2Q2MmJjZDUyNjk5NjZmYjMyM2RiYzU2ZDBjYzgyMTIxZGMzOTM5MmQ4YzBmOTczOTI3YjU3NGQ3ZmE3NDNhODFiNDMzMzNhZmQ5YWUxNmUxMzYxMDMyY2EwODAwZTQ3NGU2YThkZjc4NjJjZWZlZTJlNmUxZ  TViODQ5YTc3OGE0MG U4YzA3OTZkMTgyMTRhZjFlOGM3MzljZDUwOGNkOTNjNzM2ZTlhNzJkZTJkYTg2MDFkZTM5MTdlZDMxOGMzMmUzMjE0ZTdlN2M4MjA4YzU2NzRmZTJjYmY4MzU2YmY5MmNkMGRjZGJjOTJkOTJjMjkyMTAwMjJjOTE5NjM4ZTQ3MjdmMjE1YjM3MWUwZjgyZThhMzRiYThkZTk5NDQ2ZjBiYzgwYTAyZThkZDU3ZWVlMDd  lMTRhZGUwY2IwDQpmMjI1NDRiOGJhNDY3ZmIzZTFmNzJlNTBjMmJiNTA4ZTNhZTNhZmY0YTAwY2VkMzdjNGY3OTZmN2Y3YTk3ZDY5NzBmNmVkYThmZDljNDliZDQ4ODBiNjAwNGM2NzI3OWVlMzhhYjFhZDc4ODJlZjQ4ZjE1YzExYjM4M2E1OGI2NDdiODRkYTMyOWI5ZDk3N2U3MTllMDg1ZWZkZWI0NTdjMzE2NjIxOTYzNjllZTU4Y2I3N  mI3OGVjNTk3MjVkNzFlZGQzOGFiNzcxYTJkYTVkZGZjZDc3Mzg3NzMzNWFmZDkxZTMyNDZkMjliODlmNGN lNzlmNWEwMGU2NmRmYzZlMmQyYzkxNmVjMGI5YjkyNjY5MTg5OTEyMjAyMzU5MTk0NjMzODBjNzhlMDBlNGUyYTc1ZjE5Y2UyNmJmOWRiNGQ2N2QzYWRjNDJjMjU1DQo3NTBjYTkyMDA0MzMwY2YzOWRjMGYwMzgxZDZhZDQzZTB  hYjJiNDhlZGM1OWRlNWU0MTI0MjhkMWY5YTBhMzMzMjE2MmQ4MjBhZTNhOTM4ZTJhZDNmODY2ZDVlZTJmNjQ2YjhiODI5N2I2ZWIwNGYxZTU3MG Q4NWRhMWJhNzBjMDdlMTQwMTUxZmM2ZDYwYjI0ZTgyMzczZTRkZDdkOWQ4ODIzZWU4MG M0YzlmZWU4ZGFkZjk1MzFiYzZiMWM1MDc5ZDczYTZkYzQwOTJkYjliOWI2ZGNlYTdjZTQxZjRmY  mE3MDczNTY2ZGZjMWRhNTViY2YwNGExNjU3MzE1YjM1YjZkNzYxODc1NmRkOTI3MDNlZjFkY2MzM2VmNTEwZjA1ZDkzNDI2MjllZjJmMjc0NTgwZGJjM2JkZDdmNzI4N2IyZjFkN2I2NGU3OGEwDQowYTJmZTJhOWUxZDdlMmYzNmQ2ZTE1NmU2YzkwZGJkOTE2NWRjZWVkMjkwMGZhMGY5NDY3YWYxNWQ5Mjk2MjgwYjBkYWM0NzIzMzljMWF  jNWQ0N2MyZGE3ZWE5NzA2N2I4Njk4NDgyMDU4MTE5MTgwMjgxNWI3MDY1MzhlMWIzZmZlYWFkOThkMGM3MTIyMTc2NzJhMDAyY2Q4Y2I3YjljNzdhMDA3NTE0NTE0MDA1MTQ1MTQwMDUxNDUxNDAwNTE0NTE0MDA1MTQ1MTQwMDUxNDUxNDAwNTE0NTE0MDA1MTQ1MTQwMDUxNDUxNDAwNTE0NTE0MDA1MTQ1MTQwMDUxNDUxNDAwNTE0NTE0M  DA1MTQ1MTQwMDUxNDUxNDAwNTE0NTE0MDFmZmQ30fQ0KXGhpY2hcZjFcZGJjaFxmMVxsb2NoXGYxXGZzMjBcY2VsbA0KXHBhcmRcaW50YmxcczBcc2wyNFxxbFxwbGFpblxmMFxmczI0XGhpY2hcZjFcZGJjaFxmMVxsb2NoXGYxXGZzMjBccGFyDQpcY2VsbA0KXHBhcmRcaW50YmxcczBcc2wyNFxxbFxwbGFpblxmMFxmczI0XGhpY2hcZjF  cZGJjaFxmMVxsb2NoXGYxXGZzMjAgUmVuc2hhdywgQW15IFNccGFyDQpNUk46IDEwMTQyNDU4LCBET0I6IDEwLzEzLzE5NjIsIFNleDogRlxwYXINCkVuY291bnRlciBkYXRlOiAxMS8xMy8yMDE3XGNlbGwNClxpbnRibFxyb3cNClxwbGFpblxmMFxmczI30fQ0Ke1xoZWFkZXJmDQpcdHJvd2RcdHJnYXBoMFxsYXN0cm93XHRybGVmdDANC  lxjbHZlcnRhbHRcY2xicmRyYlxicmRyc1xicmRydzIwXGJyZHJjZjRcY2VsbHgzNTY0DQpcY2x2ZXJ0YWx0XGNsYnJkcmJcYnJkcnNcYnJkcncyMFxicmRyY2Y0XGNlbGx4NjI2NA0KXGNsdmVydGFsdFxjbGJyZHJiXGJyZHJzXGJyZHJ3MjBcYnJkcmNmNFxjZWxseDEwODAwDQpccGFyZFxpbnRibFxzMFxzbDI0XHFsXHBsYWluXGYwXGZ  zMjRcaGljaFxmMFxkYmNoXGYwXGxvY2hcZjANCntccGljdFxqcGVnYmxpcDBccGljdzE4MFxwaWNoNjlccGljd2dvYWwyNzAwXHBpY2hnb2FsMTAzNVxwaWNzY2FsZXgxMDBccGljc2NhbGV5MTAwXHNzcGljYWxpZ24wIGZmZDhmZmUwMDAxMDRhNDY0OTQ2MDAwMTAxMDEwMDYwMDA2MDAwMDBmZmRiMDA0MzAwMDgwNjA 2MDcwNjA1MDgwN  zA3MDcwOTA5MDgwYTBjMTQwZDBjMGIwYjBjMTkxMjEzMGYxNDFkMWExZjFlMWQxYTFjMWMyMDI0MmUyNzIwMjIyYzIzMWMxYzI4MzcyOTJjMzAzMTM0MzQzNDFmMjczOTNkMzgzMjNjMmUzMzM0MzJmZmRiMDA0MzAxMDkwOTA5MG MwYjBjMTgwZDBkMTgzMjIxMWMyMTMyMzIzMjMyMzIzMjMyMzIzMjMyMzINCjMyMzIzMjMyMzIzMjMyMzI  zMjMyMzIzMjMyMzIzMjMyMzIzMjMyMzIzMjMyMzIzMjMyMzIzMjMyMzIzMjMyMzIzMjMyMzIzMjMyMzIzMmZmYzAwMDExMDgwMDQ1MDBiNDAzMDEyMjAwMDIxMTAxMDMxMTAxZmZjNDAwMWYwMDAwMDEwNTAxMDEwMTAxMDEwMTAwMDAwMDAwMDAwMDAwMDAwMTAyMDMwNDA1MDYwNzA4MDkwYTBiZmZjNDAwYjUxMDAwMDIwMTAzMDMwMjA0M  DMwNTA1MDQwNDAwMDAwMTdkMDEwMjAzMDAwNDExMDUxMjIxMzE0MTA2MTM1MTYxMDcyMjcxMTQzMjgxOTFhMTA4MjM0MmIxYzExNTUyZDFmMDI0MzM2MjcyODIwOTBhMTYxNzE4MTkxYTI1MjYyNzI4MjkyYTM0MzUzNjM3MzgNCjM5M2E0MzQ0NDU0NjQ3NDg0OTRhNTM1NDU1NTY1NzU4NTk1YTYzNjQ2NTY2Njc2ODY5NmE3Mzc0NzU3Njc  3Nzg3OTdhODM4NDg1ODY4Nzg4ODk4YTkyOTM5NDk1OTY5Nzk4OTk5YWEyYTNhNGE1YTZhN2E4YTlhYWIyYjNiNGI1YjZiN2I4YjliYWMyYzNjNGM1YzZjN2M4YzljYWQyZDNkNGQ1ZDZkN2Q4ZDlkYWUxZTJlM2U0ZTVlNmU3ZThlOWVhZjFmMmYzZjRmNWY2ZjdmOGY5ZmFmZmM0MDAxZjAxMDAwMzAxMDEwMTAxMDEwMTAxMDEwMTAwMDAwM  DAwMDAwMDAxMDIwMzA0MDUwNjA3MDgwOTBhMGJmZmM0MDBiNTExMDAwMjAxMDIwNDA0MDMwNDA3MDUwNDA0MDAwMTAyNzcwMDAxMDIwMzExMDQNCjA1MjEzMTA2MTI0MTUxMDc2MTcxMTMyMjMyODEwODE0NDI5MWExYjFjMTA5MjMzMzUyZjAxNTYyNzJkMTBhMTYyNDM0ZTEyNWYxMTcxODE5MWEyNjI3MjgyOTJhMzUzNjM3MzgzOTNhNDM  0NDQ1NDY0NzQ4NDk0YTUzNTQ1NTU2NTc1ODU5NWE2MzY0NjU2NjY3Njg2OTZhNzM3NDc1NzY3Nzc4Nzk3YTgyODM4NDg1ODY4Nzg4ODk4YTkyOTM5NDk1OTY5Nzk4OTk5YWEyYTNhNGE1YTZhN2E4YTlhYWIyYjNiNGI1YjZiN2I4YjliYWMyYzNjNGM1YzZjN2M4YzljYWQyZDNkNGQ1ZDZkN2Q4ZDlkYWUyZTNlNGU1ZTZlN2U4ZTllYWYyZ  jNmNGY1ZjZmN2Y4ZjlmYWZmZGEwMDBjMDMwMTAwMDIxMTAzMTENCjAwM2YwMGY2ZGQ1YjVmZDJmNDNmMjdmYjRhZWQ2ZGZjZWRkZTVlZTUyNzc2MzE5ZTgwZmE4YWNkZmYwMDg0ZmJjMmZmMDBmNDE3OGJmZWY4N2ZmMGFlNGJlMzE3ZmNjMTdmZWRiZmZlZDNhZjJkYWY1ZjBiOTdkM2FkNDk0ZTRkZGRmZjAwOTllMmUyZjMyYWI0NmIzYTc  xNGFjYmZjOGY3ZmZmMDA4NGZiYzJmZjAwZjQxNzhiZmVmODdmZjBhZDBkMmJjNDdhNGViNzJjOTFlOWQ3YTk3MG YxOGRjZTE1NTg2MDdlMjJiZTcwYWY0N2Y4NDFmZjAwMjE1ZDRiZmViO DJmZmU4NTRmMTM5NzUzYTU0OWNkMzc3NDRlMTczM2FiNWFiNDY5YzkyYjNmZWJiOWViOTQ1MTQ1NzhlN2I4MTQ1MTQ1MDAxNDUxNDUNCjAwMTQ1M  TQ1MDAxNDUxNDUwMDE0NTE0NTAwMTQ1MTQ1MDAxNDUxNDUwMDE0NTE0NTAwMTQ1MTQ1MDA3OTc3YzYyZmY5ODJmZjAwZGI3ZmZkYTc1ZTViNWVhNWYxOGJmZTYwYmZmNmRmZmYwMDY5ZDc5YWQ4ZGJjNzc3N2QwZGJjYjNhZGJhNDhkYjRjYWNhNDg1ZjRjODFlZjVmNGI4MDc2YzM0NWZhZmU2Y2Y5NGNjOTVmMTcyNGJjYmYyNDY4ZTg1ZTF  jYmZkNzg1ZDM1OWRiM2NhYjZmMTE2MjQ3MDBiNjM4NWY3M2VkNWQ3ZmMyMjUyYmFiZWE2YWMwODYxMGE4MjA4ZTQxZGQ1ZTg5ZTFiZDAyZGZjMzdhM2M3NjEwMWRlZDlkZjJjODQ2MG JiOWVhN2Y5MG ZhMGE4NzRlZjBkNDFhNWY4OTJmZjAwNTYNCmI2N2MyZGVhMGRmMGUzZWViZTcyNDgzZWZlOWViZmE3MDU3YzdhYWIxOWMzYTc0M2QyY  zNlNWQyYTUyYTc1M2FhZGMzYzRjNmVhMWI1ODZlMmRhZmVlMmRkYmNlOGEyMmIxZWNjMTBjZTAxM2NhOWU3MDZiMmUyZDY2ZmYwMDRhZDRlZWUzOWNiNWRkOTJkZTI1YmY5OTI0OWZiZDUyNjIwNzg1MDAwYzY0NjRmNGViZDJiNmZjNDNhYmU5ZGEzZTljMjdkNDEwNGFhNWM3OTcwZWQwYzVkYzcyMzAwZmE3NWNmNmFjNmIzZDU3NTNiYmJ  jODFlNWYwODA4YWRlNzlkMWNjYWVlYmI5MG YwMDQ4NDZkY2U0MG ZmZjAwNWQ3OTI3YjI2OTc4N2I1ZGI5ZDY3NzNjZDYyNjE4NWEzNTk2MjkwNmVkYTQxMjdlNTINCjQ4MTkyMzFkNDcxZjk1NmVkNzJiZTBlYmViN2JjN2Q0ZTQ0ZDNlY2VjZGEzYjhmMjhiNDA5Yjc3ZjVjNjZiYWFhMDAyOGFjY2JkZDQ2ZjYwZDUyY2FkYWRiNGU2YjhiN  zk5ODg5YTc1N2MwODcxZWI1YTViOTQzMDVkYzM3MWU4MzNjZDAwMmQxNTlmMDZiMzY1NzNhYzVjNjk3MTNiMzVjZGJhNmY5MDZkMjAyZjRlZmRmYThhYWZlMjVkNzdmZTExZGQyODVlZmQ5YmVkMTk5MDQ3YjM3ZWNlYTBmMzljMWY0YTAwZDhhMmIzNzVlZDU3ZmIxMzQ1YjhkNDdjOWYzYmM5ZGJmYmJkZmI3Mzk2MG JkNzA3ZDZhYzU4NWU  wYmRkMmVkNmY0YTg4YzRmMGE0YTU0YjY3NmVlNTA3MTlmYzY4MDJkNTE0MDIwODANCjQxYzgzNDgxODM2NzA0MWMxYzFjMWU4NjgwMTY4YTRkYzAzMDUyNDY0ZjQxOWFjYjdkNGVlZTNkNmU0YjU5MzRmNzViMDhlMDMyOWJjZGRjNjQ3NmM2M2ZmYWY0MDFhYjQ1NTJkMmI1NWI1ZDY2Y2ZlZDc2NmNjZDBlZTJhMTk5NzE5MjNkOGQ1YzBjY  WQ5ZGFjMGUwZTBlMGY0MzQwMGI0NTczMWEyZWJmYTk2YjVhMmRkNWRjNTZmMDc5ZjBkY2JjNDIzMDA5MG MwMjgyM2JmMDcyNmI3YjRmOWFlNjdiMzQ5MmVlZGZlY2YzMzE2Y2M1OWNlZDE5MzhlN2Q3MThjZmJkMDA1OWEyOGEyODAzY2JiZTMxN2ZjYzE3ZmVkYmZmMDBlZDNhZjNmZjAwMGZlOWNkYWI2YmI2OTY0OTJhNDRkMjQ4MzA1ZjN  kYjkNCjIwN2JlMzM1ZWMzZTNhZDFhYzM1NmZiMDdkYjg 1Y2ZlZWJjY2Q5ZTQ0YWE5ZDc2ZTczOTU2Y2Y0MWU5NWNkZTk3ZTFmZDFiNDhkNGVkZWZlZGQyZmRhNTgxZjdhODkyZTEwYTkzZWY4OGM1NzQyZTIyYzA2MTI5N2Q1ZWFjZWQzNWU0ZGVmYWFlOWU2Nzk5NWIyNWM1NjI3MTFlZGExMWJjNWRiYWFlOWEzMzVmYzU5Nzc2YzM1ZGJkO  DY3YmZkNDIyMzE2OTY1ZmNiODIzNTJhODBiOGY5ODFkZTNlN2U5ZGJmMWFlYjc0MmI4MTc3YTBkODVjMDk2NDk3Y2M4MTBmOTkyYTg1NzdlM2FiMDA0ZjI3ZWI1Y2M1ZDY5NTE3ODhlZjZlZWZjZDlkZDZlOTZkMGRhYmVjOWQ0MmVkY2U3OGNhMWY5YWI2ZmMzNzcNCjA4OTY5MWU5OTE0MzI0Njk2OTEyYTI5OTFmNzMxMDM4ZTc4MWNkNzF  mZjAwNmE2MTJiNDI5YzI5YzlkZGVkZWViNDllOWFkOWQ4ZWY4NjBiMTE0ZWFjZTczNGFkZWJlN2VhNjBmOGQxZDIwZjE1Nzg3NjdiYmZmOGYyNTkzZTYyZGY3NDFkYzM5M2ZhN2U1NWRjODIwODA0MTA0MWU4NDU1NGQ0ZjRhYjNkNjJjY2RhZGY0MjI0ODg5YzhlYzU0ZmE4M2Q4ZDYxZDhmODFhYzZjMmVlMDlhM2JmZDQ1OTYwNzEyNDcwY  mNjMzYwMjBlN2E2M2E1Njg2ODcyNWEyZjg3YWQ3NTliMWYxMDRkNzBkMmVmODI1NzMwODU3YzJhYjYwOWNlM2JmNjFmNGE4ZWU2NGI4ZDQzYzI1ZTE5ZGYzYjA5ZmVkOGQxMjRkOWYNCjk5N2U2YzAzOWY2ZTNmMmFmNDJkMzNjMzk2N2E1NDE3ZDBjMTI0ZWNiNzhjNWE0MmVjMDkwNDgyMzhjMDFlYjU1NTNjMWJhNzI1ODU4NTk4OWFlYmN  iYjE5Y2NmMTkyY2I5MmQ5Y2UwZmNiZDNlOThhMDBjMGJlZDIyZGI0NGYxM2Y4NjJjZWQ0Yzg2MzEyYzhkOTkxYjI0OTM4Y2Q2N2I1YTI2OGRlMjE3OWZjNGJhNzRmMzk5YWVjM2MxN2U5MjkyYTM5YzgwNTczOGM3YjFlNzFkMDU3N2Q3ZGEyNWI2YTFhYTU4ZWExMmJjYWIzNTkxMjYzNTQyMDI5Y2ZhZjFlZGVkNTk2M2MwZjYwZDczMWM5N  zE3YmE4NWNjNTFiZjk4OTZmM2NmYmEzMGRmNGM1MDA3MzlhM2Y4N2I0ZTkzZTIwZWE1NmFkMGIxOGENCmQwMmNkMDhkZTc4NmNhOWNmYmY1MzViMWYxMmZmZTQ1NjVmZjAwYWY5NGZlNGQ1YTk3M2UxNGIxYjhkN2QzNTk1OWFlNjBiOTBjYWNlMjI3MDE2NGRiOGMwNjE4ZTljMGNmZDJhZGViOWEyNWI2YmY2MDJjZWU5ZTU0OGMzODdjYzQ  0MDM5MTlmNTA3ZDY4MDM4OWYxNDY5M2UyNGI2ZjBlZGQ0ZDdmYWUyNWNkYWFlY2RmMDg4ODBkZGYzYTgxY2UzZDcwN2YwYThhY2UxNWQ3ZjVkZDIzNDhiZjkxZmVjMTZmYTY0MzIyYzBhYzU0NDhkZTVhOWU3MWY1ZmQzZWI1ZTgxYWJlOTcwNmIzYTVjZGE3ZGNiNDhiMTRiYjc3MThjODBjMzBjMWI4YzgzZGM1NjVkZWY4M2I0ZWJiODZjO  DJjYjczMDVjNTljNGINCjBjNTczMGM5YjY0ZGFhMzAzMjcxY2QwMDcyNzFkOWRmZGJlYWZlMjBmMGQ2OGI3MTI3OTI2MDEyNDJhNjRmYjg3Mjg0YTgzZGIyMTk5N2YyY2Q1NWI3ZmVjZWIzZDFiNTViMTdkMzI3YjBkNWQ2YzcxMjA5MjQyY2IyODA0NjU4NjdhMTNkNzh lMmJiNjgzYzFiYTc1YmU5ZjczNmIxY2I3NjI0Yjk2MG QyZGQ3OWJ  mYmUyNDFjOGY5YjFlYmVkNTliYWE3ODRlMGQzZjQ0ZDU2ZTYyOTZmNmZhZmU1YjdmMmM0OTNiZjk4ZjhjOGUwNjA3YjUwMDczZjJlODE2ODllMDE4NzVlZmI0Y2ZmZGEzMWEyMzI0YzY1M2YyZTE4Mjg1MDNiNjA3MDNlOWY4NTNlMzQxYWVlYmFmZjZlNTJlNjdkMTUyNDdjMTIzMmMNCjAyYjAzYzdiODA2YjUzNDNmMDM1OTVkZThkNjMyZ  GVjYjdlYTE5NDNjOTY4NjRkYTliYmRkNzE5MTVkM2M3ZTFmYjM4ZjViM2FhYTk5MDRhNjBmYjNmOTc5MWU1ODRlM2I2MzNkYmQ2ODAzODJmMGZkYjY4ZjYxZTA4YjlkNWVmNjM5OGM5MmVlYjY3ZjI5Yzg2NzA1OTQ4NTFkODc0MWNmYTUxYTZjMzM2OTdlMmZkMWU0YjdkMzFmNGE4NmYwMTUzMDliOTMyOTkxN2Q1YjNkM2E4ZTJiYTc4YmU  xZmU5MzE0NzczMDg5ZWYwZGJjZTMxZTQ5OTQ2ZDhjZTQxZGNiYzc1MTgwMzI3M2M3YWQ0YjZiZTA4YjBiNmJkYjRiYzM3YjdmMzVjNWFiMDI4ZDJjYTFiMjA3NDVlOWQwN2EwYzc1MzQwMTUzZTFjN2YNCmM4MG VmYmZlYmZlNGZmMDBkMDUyYmIxYWNkZDEzNDRiNmQwNmQ2NWI3YjU3OTVkMjU5OGNjYzY1MjA5YzkwMDc2MDM4ZTJiNGE4M  DBhMjhhMjgwMzlkZjE1N2ZjYmE3ZmMwZmYwMGY2NWFhZmE1NjgyZjczODlhZTgxNDg3YTg1ZTg1YmZjMDU3NDczNWE0MzcxMzQ1MmNhOWJjYzU5ZGEwZjRjOWM3M2ZhNTRmNWUzNGYyODg1NmM2NGIxMTViNTVhNTk3YTI1YmZmOTFkYWIxOTI4NTE1NGUxYmY3Zjk4ZDhlMzQ4YTM1OGUzNTBhOGEzMDAwMWMwYTZhYzExMmNlZDMyYTAxMjM  wYzMzMGVlMmE0YTJiZDdlNDhlOWE2YzcxZGQ5ODdlMjZkNTI2ZDI2MWQzYTY4OTljMmJkZjI0NzJhYTQ3YmQ5ZDBhYjENCjIwMGM3YjBlOWNkNjRhNzhjZDYyYmNkNTI2OTk2NmZiMzIzZGJjNTZkMGNjODIxMjFkYzM5MzkyZDhjMGY5NzM5MjdiNTc0ZDdmYTc0M2E4MWI0MzMzM2FmZDlhZTE2ZTEzNjEwMzJjYTA4MDBlNDc0ZTZhOGRmN  zg2MmNlZmVlMmU2ZTFlNWI4NDlhNzc4YTQwZThjMDc5NmQxODIxNGFmMWU4YzczOWNkNTA4Y2Q5M2M3MzZlOWE3MmRlMmRhODYwMWRlMzkxN2VkMzE4YzMyZTMyMTRlN2U3YzgyMDhjNTY3NGZlMmNiZjgzNTZiZjkyY2QwZGNkYmM5MmQ5MmMyOTIxMDAyMmM5MTk2MzhlNDcyN2YyMTViMzcxZTBmODJlOGEzNGJhOGRlOTk0NDZmMGJjODB  hMDJlOGRkNTdlZWUwN2UxNGFkZTBjYjANCmYyMjU0NGI4YmE0NjdmYjNlMWY3MmU1MG MyYmI1MDhlM2FlM2FmZjRhMDBjZWQzN2M0Zjc5NmY3ZjdhOTdkNjk3MG Y2ZWRhOGZkOWM0OWJkNDg4MG I2MDA0YzY3Mjc5ZWUzOGFiMWFkNzg4MmVmNDhmMTVjMTFiMzgzYTU4YjY0N2I4NGRhMzI5YjlkOTc3ZTcxOWUwODVlZmRlYjQ1N2MzMTY2M  jE5NjM2OWVlNThjYjc2Yjc4ZWM1OTcyNWQ3MWVkZDM4YWI3NzFhMmRhNWRkZmNkNzczODc3MzM1YWZkOTFlMzI0NmQyOWI4OWY0Y2U3O WY1YTAwZTY2ZGZjNmUyZDJjOTE2ZWMwYjliOTI2NjkxODk5MTIyMDIzNTkxOTQ2MzM4MG M3OGUwMGU0ZTJhNzVmMTljZTI2YmY5ZGI0ZDY3ZDNhZGM0MmMyNTUNCjc1MG NhOTIwMDQzMzBjZjM5ZGM  wZjAzODFkNmFkNDNlMGFiMmI0OGVkYzU5ZGU1ZTQxMjQyOGQxZjlhMGEzMzMyMTYyZDgyMGFlM2E5MzhlMmFkM2Y4NjZkNWVlMmY2NDZiOGI4Mjk3YjZlYjA0ZjFlNTcwZDg1ZGExYmE3MG MwN2UxNDAxNTFmYzZkNjBiMjRlODIzNzNlNGRkN2Q5ZDg4MjNlZTgwYzRjOWZlZThkYWRmOTUzMWJjNmIxYzUwNzlkNzNhNmRjNDA5MmRiOWI5Y  jZkY2VhN2NlNDFmNGZiYTcwNzM1NjZkZmMxZGE1NWJjZjA0YTE2NTczMTViMzViNmQ3NjE4NzU2ZGQ5MjcwM2VmMWRjYzMzZWY1MTBmMDVkOTM0MjYyOWVmMmYyNzQ1ODBkYmMzYmRkN2Y3Mjg3YjJmMWQ3YjY0ZTc4YTANCjBhMmZlMmE5ZTFkN2UyZjM2ZDZlMTU2ZTZjOTBkYmQ5MTY1ZGNlZWQyOTAwZmEwZjk0NjdhZjE1ZDkyOTYyODB  iMGRhYzQ3MjMzOWMxYWM1ZDQ3YzJkYTdlYTk3MDY3Yjg2OTg0ODIwNTgxMTkxODAyODE1YjcwNjUzOGUxYjNmZmVhYWQ5OGQwYzcxMjIxNzY3MmEwMDJjZDhjYjdiOWM3N2EwMDc1MTQ1MTQwMDUxNDUxNDAwNTE0NTE0MDA1MTQ1MTQwMDUxNDUxNDAwNTE0NTE0MDA1MTQ1MTQwMDUxNDUxNDAwNTE0NTE0MDA1MTQ1MTQwMDUxNDUxNDAwN  TE0NTE0MDA1MTQ1MTQwMDUxNDUxNDAwNTE0NTE0MDA1MTQ1MTQwMWZmZDl9DQpcaGljaFxmMVxkYmNoXGYxXGxvY2hcZjFcZnMyMFxjZWxsDQpccGFyZFxpbnRibFxzMFxzbDI0XHFsXHBsYWluXGYwXGZzMjRcaGljaFxmMVxkYmNoXGYxXGxvY2hcZjFcZnMyMFxwYXINClxjZWxsDQpccGFyZFxpbnRibFxzMFxzbDI0XHFsXHBsYWluXGY  wXGZzMjRcaGljaFxmMVxkYmNoXGYxXGxvY2hcZjFcZnMyMCBSZW5zaGF3LCBBbXkgU1xwYXINCk1STjogMTAxNDI0NTgsIERPQjogMTAvMTMvMTk2MiwgU2V4OiBGXHBhcg0KRW5jb3VudGVyIGRhdGU6IDExLzEzLzIwMTdcY2VsbA0KXGludGJsXHJvdw0KXHBsYWluXGYwXGZzMjR9DQp7XGZvb3Rlcg0KXHRyb3dkXHRyZ2FwaDBcdHJsZ  WZ0MA0KXGNsdmVydGFsdFxjZWxseDg2NDANClxjbHZlcnRhbHRcY2VsbHgxMDgwMA0KXHBhcmRcaW50YmxcczBccWxccGxhaW5cZjBcZnMyNFxoaWNoXGYxXGRiY2hcZjFcbG9jaFxmMVxmczIwIFJlbnNoYXcsIEFteSBTIChNUiAjIDEwMTQyNDU4KSBQcmludGVkIGJ5IEludGVyZmFjZSwgRG9jdW1lbnRhdGlvbiBPdXQgW0VESURPQ09  VVF0gYXQgMTEvMTYvMTcgIDM6NTMgUE1cY2VsbA0KXHBhcmRcaW50YmxcczBccXJccGxhaW5cZjBcZnMyNFxoaWNoXGYxXGRiY2hcZjFcbG9jaFxmMVxmczIwIFBhZ2Uge1xmaWVsZHtcKlxmbGRpbnN0IFBBR0V9e1xmbGRyc2x0IDJ84fSBvZiB7XGZpZWxke1wqXGZsZGluc3QgTlVNUEFHRVN9e1xmbGRyc2x0IDJ9fVxxclxjZWxsDQpca  W50Ymx ccm93DQpccGxhaW5cZjBcZnMyNH0NCntcZm9vdGVyZg0KXHRyb3dkXHRyZ2FwaDBcdHJsZWZ0MA0KXGNsdmVydGFsdFxjZWxseDg2NDANClxjbHZlcnRhbHRcY2VsbHgxMDgwMA0KXHBhcmRcaW50YmxcczBccWxccGxhaW5cZjBcZnMyNFxoaWNoXGYxXGRiY2hcZjFcbG9jaFxmMVxmczIwIFJlbnNoYXcsIEFteSBTIChNUiAjIDE  wMTQyNDU4KSBQcmludGVkIGJ5IEludGVyZmFjZSwgRG9jdW1lbnRhdGlvbiBPdXQgW0VESURPQ09VVF0gYXQgMTEvMTYvMTcgIDM6NTMgUE1cY2VsbA0KXHBhcmRcaW50YmxcczBccXJccGxhaW5cZjBcZnMyNFxoaWNoXGYxXGRiY2hcZjFcbG9jaFxmMVxmczIwIFBhZ2Uge1xmaWVsZHtcKlxmbGRpbnN0IFBBR0V9e1xmbGRyc2x0IDF63f  SBvZiB7XGZpZWxke1wqXGZsZGluc3QgTlVNUEFHRVN9e1xmbGRyc2x0IDJ40fVxxclxjZWxsDQpcaW50Ymxccm93DQpccGxhaW5cZjBcZnMyNH0NClx0cm93ZFx0cmdhcGgwXGxhc3Ryb3dcdHJsZWZ0MFx0cnJoMjgwXGx0cnJvdw0KXGNsdmVydGFsYlxjbGJyZHJiXGJyZHJzXGJyZHJ3MjBcYnJkcmNmMlxjZWxseDEwODAwDQpccGFyZFx  pbnRibFxzMFxzbDI0XHFsXGtlZXBue1wqXGJrbWtzdGFydCBQcm9ncmVzcyBOb3RlcyBieSBCcmF1biwgSmVubmlmZXIgTC4sIFJNQSBhdCAxMS8xMy8xNyAxNjE33fXtcKlxia21rZW5kIFByb2dyZXNzIE5vdGVzIGJ5IEJyYXVuLCBKZW5uaWZlciBMLiwgUk1BIGF0IDExLzEzLzE3IDE2MTV9XGhpY2hcZjFcZGJjaFxmMVxsb2NoXGYxX  GZzMjBcdiBibWtcY2YyXGx0cmNoXGJcdjAgUHJvZ3Jlc3Mg Tm90ZXMgYnkgQnJhdW4sIEplbm5pZmVyIA0KTC4sIFJNQSBhdCAxMS8xMy8xNyAxNjE1XHBsYWluXGhpY2hcZjFcZGJjaFxmMVxsb2NoXGYxXGZzMjAgIFxjZWxsDQpcaW50Ymxccm93DQpccGFyZFxzMFxxbFxwbGFpblxmMFxmczI0XGhpY2hcZjFcZGJjaFxmMVxsb2NoXGY  xXGZzMjBccGFyZFxzZWN0DQpcc2VjdGRccGd3c3huMTIyNDBccGdoc3huMTU4NDBcbWFyZ2xzeG44NjRcbWFyZ3JzeG41NzZcbWFyZ3RzeG43MjBcbWFyZ2JzeG43MjBcaGVhZGVyeTcyMFxmb290ZXJ5NzIwXHNia25vbmVccGduY29udFxwZ25kZWMNCntcaGVhZGVyDQpcdHJvd2RcdHJnYXBoMFxsYXN0cm93XHRybGVmdDANClxjbHZlc  nRhbHRcY2xicmRyYlxicmRyc1xicmRydzIwXGJyZHJjZjRcY2VsbHgzNTY0DQpcY2x2ZXJ0YWx0XGNsYnJkcmJcYnJkcnNcYnJkcncyMFxicmRyY2Y0XGNlbGx4NjI2NA0KXGNsdmVydGFsdFxjbGJyZHJiXGJyZHJzXGJyZHJ3MjBcYnJkcmNmNFxjZWxseDEwODAwDQpccGFyZFxpbnRibFxzMFxzbDI0XHFsXHBsYWluXGYwXGZzMjRcaGl  jaFxmMFxkYmNoXGYwXGxvY2hcZjANCntccGljdFxqcGVnYmxpcDBccGljdzE4MFxwaWNoNjlccGljd2dvYWwyNzAwXHBpY2hnb2FsMTAzNVxwaWNzY2FsZXgxMDBccGljc2NhbGV5MTAwXHNzcGljYWxpZ24wIGZmZDh mZmUwMDAxMDRhNDY0OTQ2MDAwMTAxMDEwMDYwMDA2MDAwMDBmZmRiMDA0MzAwMDgwNjA2MDcwNjA1MDgwNzA3MDcwO  TA5MDgwYTBjMTQwZDBjMGIwYjBjMTkxMjEzMGYxNDFkMWExZjFlMWQxYTFjMWMyMDI0MmUyNzIwMjIyYzIzMWMxYzI4MzcyOTJjMzAzMTM0MzQzNDFmMjczOTNkMzgzMjNjMmUzMzM0MzJmZmRiMDA0MzAxMDkwOTA5MG MwYjBjMTgwZDBkMTgzMjIxMWMyMTMyMzIzMjMyMzIzMjMyMzIzMjMyMzINCjMyMzIzMjMyMzIzMjMyMzIzMjMyMzI  zMjMyMzIzMjMyMzIzMjMyMzIzMjMyMzIzMjMyMzIzMjMyMzIzMjMyMzIzMjMyMzIzMjMyMzIzMmZmYzAwMDExMDgwMDQ1MDBiNDAzMDEyMjAwMDIxMTAxMDMxMTAxZmZjNDAwMWYwMDAwMDEwNTAxMDEwMTAxMDEwMTAwMDAwMDAwMDAwMDAwMDAwMTAyMDMwNDA1MDYwNzA4MDkwYTBiZmZjNDAwYjUxMDAwMDIwMTAzMDMwMjA0MDMwNTA1M  DQwNDAwMDAwMTdkMDEwMjAzMDAwNDExMDUxMjIxMzE0MTA2MTM1MTYxMDcyMjcxMTQzMjgxOTFhMTA4MjM0MmIxYzExNTUyZDFmMDI0MzM2MjcyODIwOTBhMTYxNzE4MTkxYTI1MjYyNzI4MjkyYTM0MzUzNjM3MzgNCjM5M2E0MzQ0NDU0NjQ3NDg0OTRhNTM1NDU1NTY1NzU4NTk1YTYzNjQ2NTY2Njc2ODY5NmE3Mzc0NzU3Njc3Nzg3OTd  hODM4NDg1ODY4Nzg4ODk4YTkyOTM5NDk1OTY5Nzk4OTk5YWEyYTNhNGE1YTZhN2E4YTlhYWIyYjNiNGI1YjZiN2I4YjliYWMyYzNjNGM1YzZjN2M4YzljYWQyZDNkNGQ1ZDZkN2Q4ZDlkYWUxZTJlM2U0ZTVlNmU3ZThlOWVhZjFmMmYzZjRmNWY2ZjdmOGY5ZmFmZmM0MDAxZjAxMDAwMzAxMDEwMTAxMDEwMTAxMDEwMTAwMDAwMDAwMDAwM  DAxMDIwMzA0MDUwNjA3MDgwOTBhMGJmZmM0MDBiNTExMDAwMjAxMDIwNDA0MDMwNDA3MDUwNDA0MDAwMTAyNzcwMDAxMDIwMzExMDQNCjA1MjEzMTA2MTI0MTUxMDc2MTcxMTMyMjMyODEwODE0NDI5MWExYjFjMTA5MjMzMzUyZjAxNTYyNzJkMTBhMTYyNDM0ZTEyNWYxMTcxODE5MWEyNjI3MjgyOTJhMzUzNjM3MzgzOTNhNDM0NDQ1NDY  0NzQ4NDk0YTUzNTQ1NTU2NTc1ODU5NWE2MzY0NjU2NjY3Njg2OTZhNzM3NDc1NzY3Nzc4Nzk3YTgyODM4NDg1ODY4Nzg4ODk4YTkyOTM5NDk1OTY5Nzk4OTk5YWEyYTNhNGE1YTZhN2E4YTlhYWIyYjNiNGI1YjZiN2I4YjliYWMyYzNjNGM1YzZjN2M4YzljYWQyZDNkNGQ1ZDZkN2Q4ZDlkYWUyZTNlNGU1ZTZlN2U4ZTllYWYyZjNmNGY1Z  jZmN2Y4ZjlmYWZmZGEwMDBjMDMwMTAwMDIxMTAzMTENCjAwM2YwMGY2ZGQ1YjVmZDJmNDNmMjdmYjRhZWQ2ZGZjZWRkZTVlZTUyNzc2MzE5ZTgwZmE4YWNkZmYwMDg0ZmJjMmZmMDBmNDE3OGJmZWY4N2ZmMGFlNGJlMzE3ZmNjMTdmZWRiZmZlZDNhZjJkYWY1ZjBiOTdkM2FkNDk0ZTRkZGRmZjAwOTllMmUyZjMyYWI0NmIzYTcxNGFjYmZ  jOGY3ZmZmMDA4NGZiYzJmZjAwZjQxNzhiZmVmODdmZjBhZDBkMmJjNDdhNGViNzJjO TFlOWQ3YTk3MG YxOGRjZTE1NTg2MDdlMjJiZTcwYWY0N2Y4NDFmZjAwMjE1ZDRiZmViODJmZmU4NTRmMTM5NzUzYTU0OWNkMzc3NDRlMTczM2FiNWFiNDY5YzkyYjNmZWJiOWViOTQ1MTQ1NzhlN2I4MTQ1MTQ1MDAxNDUxNDUNCjAwMTQ1MTQ1MDAxN  DUxNDUwMDE0NTE0NTAwMTQ1MTQ1MDAxNDUxNDUwMDE0NTE0NTAwMTQ1MTQ1MDA3OTc3YzYyZmY5ODJmZjAwZGI3ZmZkYTc1ZTViNWVhNWYxOGJmZTYwYmZmNmRmZmYwMDY5ZDc5YWQ4ZGJjNzc3N2QwZGJjYjNhZGJhNDhkYjRjYWNhNDg1ZjRjODFlZjVmNGI4MDc2YzM0NWZhZmU2Y2Y5NGNjOTVmMTcyNGJjYmYyNDY4ZTg1ZTFjYmZkNzg  1ZDM1OWRiM2NhYjZmMTE2MjQ3MDBiNjM4NWY3M2VkNWQ3ZmMyMjUyYmFiZWE2YWMwODYxMGE4MjA4ZTQxZGQ1ZTg5ZTFiZDAyZGZjMzdhM2M3NjEwMWRlZDlkZjJjODQ2MG JiOWVhN2Y5MG ZhMGE4NzRlZjBkNDFhNWY4OTJmZjAwNTYNCmI2N2MyZGVhMGRmMGUzZWViZTcyNDgzZWZlOWViZmE3MDU3YzdhYWIxOWMzYTc0M2QyYzNlNWQyY  TUyYTc1M2FhZGMzYzRjNmVhMWI1ODZlMmRhZmVlMmRkYmNlOGEyMmIxZWNjMTBjZTAxM2NhOWU3MDZiMmUyZDY2ZmYwMDRhZDRlZWUzOWNiNWRkOTJkZTI1YmY5OTI0OWZiZDUyNjIwNzg1MDAwYzY0NjRmNGViZDJiNmZjNDNhYmU5ZGEzZTljMjdkNDEwNGFhNWM3OTcwZWQwYzVkYzcyMzAwZmE3NWNmNmFjNmIzZDU3NTNiYmJjODFlNWY  wODA4YWRlNzlkMWNjYWVlYmI5MG YwMDQ4NDZkY2U0MG ZmZjAwNWQ3OTI3YjI2OTc4N2I1ZGI5ZDY3NzNjZDYyNjE4NWEzNTk2MjkwNmVkYTQxMjdlNTINCjQ4MTkyMzFkNDcxZjk1NmVkNzJiZTBlYmViN2JjN2Q0ZTQ0ZDNlY2VjZGEzYjhmMjhiNDA5Yjc3ZjVjNjZiYWFhMDAyOGFjY2JkZDQ2ZjYwZDUyY2FkYWRiNGU2YjhiNzk5ODg5Y  Tc1N2MwODcxZWI1YTViOTQzMDVkYzM3MWU4MzNjZDAwMmQxNTlmMDZiMzY1NzNhYzVjNjk3MTNiMzVjZGJhNmY5MDZkMjAyZjRlZmRmYThhYWZlMjVkNzdmZTExZGQyODVlZmQ5YmVkMTk5MDQ3YjM3ZWNlYTBmMzljMWY0YTAwZDhhMmIzNzVlZDU3ZmIxMzQ1YjhkNDdjOWYzYmM5ZGJmYmJkZmI3Mzk2MG JkNzA3ZDZhYzU4NWUwYmRkMmV  kNmY0YTg4YzRmMGE0YTU0YjY3NmVlNTA3MTlmYzY4MDJkNTE0MDIwODANCjQxYzgzNDgxODM2NzA0MWMxYzFjMWU4NjgwMTY4YTRkYzAzMDUyNDY0ZjQxOWFjYjdkNGVlZTNkNmU0YjU5MzRmNzViMDhlMDMyOWJjZGRjNjQ3NmM2M2ZmYWY0MDFhYjQ1NTJkMmI1NWI1ZDY2Y2ZlZDc2NmNjZDBlZTJhMTk5NzE5MjNkOGQ1YzBjYWQ5ZGFjM  GUwZTBlMGY0MzQwMGI0NTczMWEyZWJmYTk2YjVhMmRkNWRjNTZmMDc5ZjBkY2JjNDIzMDA5MG MwMjgyM2JmMDcyNmI3YjRmOWFlNjdiMzQ5MmVlZGZlY2YzMzE2Y2M1OWNlZDE5MzhlN2Q3MThjZmJkMDA1OWEyOGEyODAzY2JiZTMxN2ZjYzE3ZmVkYmZmMDBlZDNhZjNmZjAwMGZlOWNkYWI2YmI2O TY0OTJhNDRkMjQ4MzA1ZjNkYjkNCjI  wN2JlMzM1ZWMzZTNhZDFhYzM1NmZiMDdkYjg1Y2ZlZWJjY2Q5ZTQ0YWE5ZDc2ZTczOTU2Y2Y0MWU5NWNkZTk3ZTFmZDFiNDhkNGVkZWZlZGQyZmRhNTgxZjdhODkyZTEwYTkzZWY4OGM1NzQyZTIyYzA2MTI5N2Q1ZWFjZWQzNWU0ZGVmYWFlOWU2Nzk5NWIyNWM1NjI3MTFlZGExMWJjNWRiYWFlOWEzMzVmYzU5Nzc2YzM1ZGJkODY3YmZkN  DIyMzE2OTY1ZmNiODIzNTJhODBiOGY5ODFkZTNlN2U5ZGJmMWFlYjc0MmI4MTc3YTBkODVjMDk2NDk3Y2M4MTBmOTkyYTg1NzdlM2FiMDA0ZjI3ZWI1Y2M1ZDY5NTE3ODhlZjZlZWZjZDlkZDZlOTZkMGRhYmVjOWQ0MmVkY2U3OGNhMWY5YWI2ZmMzNzcNCjA4OTY5MWU5OTE0MzI0Njk2OTEyYTI5OTFmNzMxMDM4ZTc4MWNkNzFmZjAwNmE  2MTJiNDI5YzI5YzlkZGVkZWViNDllOWFkOWQ4ZWY4NjBiMTE0ZWFjZTczNGFkZWJlN2VhNjBmOGQxZDIwZjE1Nzg3NjdiYmZmOGYyNTkzZTYyZGY3NDFkYzM5M2ZhN2U1NWRjODIwODA0MTA0MWU4NDU1NGQ0ZjRhYjNkNjJjY2RhZGY0MjI0ODg5YzhlYzU0ZmE4M2Q4ZDYxZDhmODFhYzZjMmVlMDlhM2JmZDQ1OTYwNzEyNDcwYmNjMzYwM  jBlN2E2M2E1Njg2ODcyNWEyZjg3YWQ3NTliMWYxMDRkNzBkMmVmODI1NzMwODU3YzJhYjYwOWNlM2JmNjFmNGE4ZWU2NGI4ZDQzYzI1ZTE5ZGYzYjA5ZmVkOGQxMjRkOWYNCjk5N2U2YzAzOWY2ZTNmMmFmNDJkMzNjMzk2N2E1NDE3ZDBjMTI0ZWNiNzhjNWE0MmVjMDkwNDgyMzhjMDFlYjU1NTNjMWJhNzI1ODU4NTk4OWFlYmNiYjE5Y2N  mMTkyY2I5MmQ5Y2UwZmNiZDNlOThhMDBjMGJlZDIyZGI0NGYxM2Y4NjJjZWQ0Yzg2MzEyYzhkOTkxYjI0OTM4Y2Q2N2I1YTI2OGRlMjE3OWZjNGJhNzRmMzk5YWVjM2MxN2U5MjkyYTM5YzgwNTczOGM3YjFlNzFkMDU3N2Q3ZGEyNWI2YTFhYTU4ZWExMmJjYWIzNTkxMjYzNTQyMDI5Y2ZhZjFlZGVkNTk2M2MwZjYwZDczMWM5NzE3YmE4N  WNjNTFiZjk4OTZmM2NmYmEzMGRmNGM1MDA3MzlhM2Y4N2I0ZTkzZTIwZWE1NmFkMGIxOGENCmQwMmNkMDhkZTc4NmNhOWNmYmY1MzViMWYxMmZmZTQ1NjVmZjAwYWY5NGZlNGQ1YTk3M2UxNGIxYjhkN2QzNTk1OWFlNjBiOTBjYWNlMjI3MDE2NGRiOGMwNjE4ZTljMGNmZDJhZGViOWEyNWI2YmY2MDJjZWU5ZTU0OGMzODdjYzQ0MDM5MTl  mNTA3ZDY4MDM4OWYxNDY5M2UyNGI2ZjBlZGQ0ZDdmYWUyNWNkYWFlY2RmMDg4ODBkZGYzYTgxY2UzZDcwN2YwYThhY2UxNWQ3ZjVkZDIzNDhiZjkxZmVjMTZmYTY0MzIyYzBhYzU0NDhkZTVhOWU3MWY1ZmQzZWI1ZTgxYWJlOTcwNmIzYTVjZGE3ZGNiNDhiMTRiYjc3MThjODBjMzBjMWI4YzgzZGM1NjVkZWY4M2I0ZWJiODZjODJjYjczM  DVjNTljNGINCjBjNTczMGM5YjY0ZGFhMzAzMjcxY2QwMDcyNzFkOWRmZGJlYWZlMjBmMGQ2OGI3MTI3OTI2MDEyNDJhNjRmYjg3Mjg0YTgzZGIyMTk5N2YyY2Q1NWI 3ZmVjZWIzZDFiNTViMTdkMzI3YjBkNWQ2YzcxMjA5MjQyY2IyODA0NjU4NjdhMTNkNzhlMmJiNjgzYzFiYTc1YmU5ZjczNmIxY2I3NjI0Yjk2MG QyZGQ3OWJmYmUyNDF  jOGY5YjFlYmVkNTliYWE3ODRlMGQzZjQ0ZDU2ZTYyOTZmNmZhZmU1YjdmMmM0OTNiZjk4ZjhjOGUwNjA3YjUwMDczZjJlODE2ODllMDE4NzVlZmI0Y2ZmZGEzMWEyMzI0YzY1M2YyZTE4Mjg1MDNiNjA3MDNlOWY4NTNlMzQxYWVlYmFmZjZlNTJlNjdkMTUyNDdjMTIzMmMNCjAyYjAzYzdiODA2YjUzNDNmMDM1OTVkZThkNjMyZGVjYjdlY  TE5NDNjOTY4NjRkYTliYmRkNzE5MTVkM2M3ZTFmYjM4ZjViM2FhYTk5MDRhNjBmYjNmOTc5MWU1ODRlM2I2MzNkYmQ2ODAzODJmMGZkYjY4ZjYxZTA4YjlkNWVmNjM5OGM5MmVlYjY3ZjI5Yzg2NzA1OTQ4NTFkODc0MWNmYTUxYTZjMzM2OTdlMmZkMWU0YjdkMzFmNGE4NmYwMTUzMDliOTMyOTkxN2Q1YjNkM2E4ZTJiYTc4YmUxZmU5MzE  0NzczMDg5ZWYwZGJjZTMxZTQ5OTQ2ZDhjZTQxZGNiYzc1MTgwMzI3M2M3YWQ0YjZiZTA4YjBiNmJkYjRiYzM3YjdmMzVjNWFiMDI4ZDJjYTFiMjA3NDVlOWQwN2EwYzc1MzQwMTUzZTFjN2YNCmM4MG VmYmZlYmZlNGZmMDBkMDUyYmIxYWNkZDEzNDRiNmQwNmQ2NWI3YjU3OTVkMjU5OGNjYzY1MjA5YzkwMDc2MDM4ZTJiNGE4MDBhMjhhM  jgwMzlkZjE1N2ZjYmE3ZmMwZmYwMGY2NWFhZmE1NjgyZjczODlhZTgxNDg3YTg1ZTg1YmZjMDU3NDczNWE0MzcxMzQ1MmNhOWJjYzU5ZGEwZjRjOWM3M2ZhNTRmNWUzNGYyODg1NmM2NGIxMTViNTVhNTk3YTI1YmZmOTFkYWIxOTI4NTE1NGUxYmY3Zjk4ZDhlMzQ4YTM1OGUzNTBhOGEzMDAwMWMwYTZhYzExMmNlZDMyYTAxMjMwYzMzMGV  lMmE0YTJiZDdlNDhlOWE2YzcxZGQ5ODdlMjZkNTI2ZDI2MWQzYTY4OTljMmJkZjI0NzJhYTQ3YmQ5ZDBhYjENCjIwMGM3YjBlOWNkNjRhNzhjZDYyYmNkNTI2OTk2NmZiMzIzZGJjNTZkMGNjODIxMjFkYzM5MzkyZDhjMGY5NzM5MjdiNTc0ZDdmYTc0M2E4MWI0MzMzM2FmZDlhZTE2ZTEzNjEwMzJjYTA4MDBlNDc0ZTZhOGRmNzg2MmNlZ  mVlMmU2ZTFlNWI4NDlhNzc4YTQwZThjMDc5NmQxODIxNGFmMWU4YzczOWNkNTA4Y2Q5M2M3MzZlOWE3MmRlMmRhODYwMWRlMzkxN2VkMzE4YzMyZTMyMTRlN2U3YzgyMDhjNTY3NGZlMmNiZjgzNTZiZjkyY2QwZGNkYmM5MmQ5MmMyOTIxMDAyMmM5MTk2MzhlNDcyN2YyMTViMzcxZTBmODJlOGEzNGJhOGRlOTk0NDZmMGJjODBhMDJlOGR  kNTdlZWUwN2UxNGFkZTBjYjANCmYyMjU0NGI4YmE0NjdmYjNlMWY3MmU1MG MyYmI1MDhlM2FlM2FmZjRhMDBjZWQzN2M0Zjc5NmY3ZjdhOTdkNjk3MG Y2ZWRhOGZkOWM0OWJkNDg4MG I2MDA0YzY3Mjc5ZWUzOGFiMWFkNzg4MmVmNDhmMTVjMTFiMzgzYTU4YjY0N2I4NGRhMzI5YjlkOTc3ZTcxOWUwODVlZmRlYjQ1N2MzMTY2MjE5NjM2O  WVlNThjYjc2Yjc4ZWM1OTcyNWQ3M WVkZDM4YWI3NzFhMmRhNWRkZmNkNzczODc3MzM1YWZkOTFlMzI0NmQyOWI4OWY0Y2U3OWY1YTAwZTY2ZGZjNmUyZDJjOTE2ZWMwYjliOTI2NjkxODk5MTIyMDIzNTkxOTQ2MzM4MG M3OGUwMGU0ZTJhNzVmMTljZTI2YmY5ZGI0ZDY3ZDNhZGM0MmMyNTUNCjc1MG NhOTIwMDQzMzBjZjM5ZGMwZjAzODF  kNmFkNDNlMGFiMmI0OGVkYzU5ZGU1ZTQxMjQyOGQxZjlhMGEzMzMyMTYyZDgyMGFlM2E5MzhlMmFkM2Y4NjZkNWVlMmY2NDZiOGI4Mjk3YjZlYjA0ZjFlNTcwZDg1ZGExYmE3MG MwN2UxNDAxNTFmYzZkNjBiMjRlODIzNzNlNGRkN2Q5ZDg4MjNlZTgwYzRjOWZlZThkYWRmOTUzMWJjNmIxYzUwNzlkNzNhNmRjNDA5MmRiOWI5YjZkY2VhN  2NlNDFmNGZiYTcwNzM1NjZkZmMxZGE1NWJjZjA0YTE2NTczMTViMzViNmQ3NjE4NzU2ZGQ5MjcwM2VmMWRjYzMzZWY1MTBmMDVkOTM0MjYyOWVmMmYyNzQ1ODBkYmMzYmRkN2Y3Mjg3YjJmMWQ3YjY0ZTc4YTANCjBhMmZlMmE5ZTFkN2UyZjM2ZDZlMTU2ZTZjOTBkYmQ5MTY1ZGNlZWQyOTAwZmEwZjk0NjdhZjE1ZDkyOTYyODBiMGRhYzQ  3MjMzOWMxYWM1ZDQ3YzJkYTdlYTk3MDY3Yjg2OTg0ODIwNTgxMTkxODAyODE1YjcwNjUzOGUxYjNmZmVhYWQ5OGQwYzcxMjIxNzY3MmEwMDJjZDhjYjdiOWM3N2EwMDc1MTQ1MTQwMDUxNDUxNDAwNTE0NTE0MDA1MTQ1MTQwMDUxNDUxNDAwNTE0NTE0MDA1MTQ1MTQwMDUxNDUxNDAwNTE0NTE0MDA1MTQ1MTQwMDUxNDUxNDAwNTE0NTE0M  DA1MTQ1MTQwMDUxNDUxNDAwNTE0NTE0MDA1MTQ1MTQwMWZmZDl9DQpcaGljaFxmMVxkYmNoXGYxXGxvY2hcZjFcZnMyMFxjZWxsDQpccGFyZFxpbnRibFxzMFxzbDI0XHFsXHBsYWluXGYwXGZzMjRcaGljaFxmMVxkYmNoXGYxXGxvY2hcZjFcZnMyMFxwYXINClxjZWxsDQpccGFyZFxpbnRibFxzMFxzbDI0XHFsXHBsYWluXGYwXGZzMjR  caGljaFxmMVxkYmNoXGYxXGxvY2hcZjFcZnMyMCBSZW5zaGF3LCBBbXkgU1xwYXINCk1STjogMTAxNDI0NTgsIERPQjogMTAvMTMvMTk2MiwgU2V4OiBGXHBhcg0KRW5jb3VudGVyIGRhdGU6IDExLzEzLzIwMTdcY2VsbA0KXGludGJsXHJvdw0KXHRyb3dkXHRyZ2FwaDBcbGFzdHJvd1x0cmxlZnQwXHRycmgyODBcbHRycm93DQpcY2x2Z  XJ0YWxiXGNsYnJkcmJcYnJkcnNcYnJkcncyMFxicmRyY2YyXGNlbGx4MTA4MDANClxwYXJkXGludGJsXHMwXHNsMjRccWxca2VlcG5ccGxhaW5cZjBcZnMyNFxoaWNoXGYxXGRiY2hcZjFcbG9jaFxmMVxjZjJcZnMyMFxsdHJjaFxiIFByb2dyZXNzIE5vdGVzIGJ5IEJyYXVuLCBKZW5uaWZlciBMLiwgUk1BIGF0IDExLzEzLzE3IDE2MTU  gKGNvbnRpbnVlZClccGxhaW5caGljaFxmMVxkYmNoXGYxXGxvY2hcZjFcZnMyMCAgXGNlbGwNClxpbnRibFxyb3cNClxwbGFpblxmMFxmczI60fQ0KXHRyb3dkXHRyZ2FwaDBcdHJsZWZ0MFx0cmtlZXANClxjbHZlcnRhbHRcY2VsbHgyMTYNClxjb HZlcnRhbHRcY2VsbHgzNzQ0DQpcY2x2ZXJ0YWx0XGNlbGx4NzI3Mg 0KXGNsdmVydGFsd  FxjZWxseDEwODAwDQpccGFyZFxpbnRibFxzMFxzbDI0XHFsXGtlZXBuXHBsYWluXGYwXGZzMjRcaGljaFxmMVxkYmNoXGYxXGxvY2hcZjFcZnMyMFxjZWxsDQpccGFyZFxpbnRibFxzMFxsaTgwXHJpODBcc2wyNFxxbFxrZWVwblxwbGFpblxmMFxmczI0XGhpY2hcZjFcZGJjaFxmMVxsb2NoXGYxXGNmM1xmczIwIEF1dGhvcjogIFxjZjQ  gQnJhdW4sIEplbm5pZmVyIEwuLCBSTUFcY2YwXGNlbGwNClxwYXJkXGludGJsXHMwXGxpODBccmk4MFxzbDI0XHFsXGtlZXBuXHBsYWluXGYwXGZzMjRcaGljaFxmMVxkYmNoXGYxXGxvY2hcZjFcY2YzXGZzMjAgU2VydmljZTogIFxjZjQgT3J0aG9wZWRpY3NcY2YwXGNlbGwNClxwYXJkXGludGJsXHMwXGxpODBccmk4MFxzbDI0XHFsX  GtlZXBuXHBsYWluXGYwXGZzMjRcaGljaFxmMVxkYmNoXGYxXGxvY2hcZjFcY2YzXGZzMjAgQXV0aG9yIFR5cGU6ICBcY2Y0IE1lZGljYWwgQXNzaXN0YW50XGNmMFxjZWxsDQpcaW50Ymxccm93DQpccGFyZFxpbnRibFxzMFxzbDI0XHFsXGtlZXBuXHBsYWluXGYwXGZzMjRcaGljaFxmMVxkYmNoXGYxXGxvY2hcZjFcZnMyMFxjZWxsDQp  ccGFyZFxpbnRibFxzMFxsaTgwXHJpODBcc2wyNFxxbFxrZWVwblxwbGFpblxmMFxmczI0XGhpY2hcZjFcZGJjaFxmMVxsb2NoXGYxXGNmM1xmczIwIEZpbGVkOiAgXGNmNCAxMS8xNi8xNyAxNTIxXGNmMFxjZWxsDQpccGFyZFxpbnRibFxzMFxsaTgwXHJpODBcc2wyNFxxbFxrZWVwblxwbGFpblxmMFxmczI0XGhpY2hcZjFcZGJjaFxmM  Vxsb2NoXGYxXGNmM1xmczIwIEVuY291bnRlciBEYXRlOiAgXGNmNCAxMS8xMy8yMDE3XGNmMFxjZWxsDQpccGFyZFxpbnRibFxzMFxsaTgwXHJpODBcc2wyNFxxbFxrZWVwblxwbGFpblxmMFxmczI0XGhpY2hcZjFcZGJjaFxmMVxsb2NoXGYxXGNmM1xmczIwIFN0YXR1czogIFxjZjQgU2lnbmVkXGNmMFxjZWxsDQpcaW50Ymxccm93DQp  cdHJvd2RcdHJnYXBoMFxsYXN0cm93XHRybGVmdDBcdHJrZWVwDQpcY2x2ZXJ0YWx0XGNlbGx4MjE2DQpcY2x2ZXJ0YWx0XGNsbWdmXGNlbGx4NzI3Mg 0KXGNsbXJnXGNlbGx4NzI3Mg 0KXGNsdmVydGFsdFxjZWxseDEwODAwDQpccGFyZFxpbnRibFxzMFxzbDI0XHFsXGtlZXBuXHBsYWluXGYwXGZzMjRcaGljaFxmMVxkYmNoXGYxXGxvY  2hcZjFcZnMyMFxjZWxsDQpccGFyZFxpbnRibFxzMFxsaTgwXHJpODBcc2wyNFxxbFxrZWVwblxwbGFpblxmMFxmczI0XGhpY2hcZjFcZGJjaFxmMVxsb2NoXGYxXGNmM1xmczIwIEVkaXRvcjogIFxjZjQgTGltLCBFZHdhcmQgVi5BLiwgTUQgKFBoeXNpY2lhbilcY2YwXGNlbGxcY2VsbA0KXHBhcmRcaW50YmxcczBcbGk4MFxyaTgwXHN  sMjRccWxca2VlcG5ccGxhaW5cZjBcZnMyNFxoaWNoXGYxXGRiY2hcZjFcbG9jaFxmMVxjZjNcZnMyMCBDb3NpZ25 lcjogIFxjZjQgTGltLCBFZHdhcmQgVi5BLiwgTUQgYXQgMTEvMTYvMTcgMTU1M1xjZjBcY2VsbA0KXGludGJsXHJvdw0KXHRyb3dkXHRyZ2FwaDBcbGFzdHJvd1x0cmxlZnQwDQpcY2x2ZXJ0YWx0XGNlbGx4MjE2DQpcY  2x2ZXJ0YWx0XGNlbGx4MTA4MDANClxwYXJkXGludGJsXHMwXHNsMjRccWxccGxhaW5cZjBcZnMyNFxoaWNoXGYxXGRiY2hcZjFcbG9jaFxmMVxmczIwXGNlbGwNClxwYXJkXGludGJsXHMwXGxpODBccmk4MFxzbDI0XHFsXHBsYWluXGYwXGZzMjRcaGljaFxmMVxkYmNoXGYxXGxvY2hcZjFcZnMyMFxjZWxsDQpcaW50Ymxccm93DQpcdHJ  vd2RcdHJnYXBoMFx0cnBhZGRsMFx0cnBhZGRmbDNcdHJwYWRkcjBcdHJwYWRkZnIzXHRycGFkZHQwXHRycGFkZGZ0M1x0cnBhZGRiMFx0cnBhZGRmYjNcdHJsZWZ0MFx0cnJoMjQ1DQpcY2x2ZXJ0YWx0XGNsY2JwYXQxMVxjbHBhZHQwXGNscGFkZnQzXGNscGFkcjBcY2xwYWRmcjNcY2xwYWRsMFxjbHBhZGZsM1xjbHBhZGIwXGNscGFkZ  mIzXGNlbGx4OTUwMA0KXHBhcmRcaW50YmxcczBccWxcd2lkY3RscGFyXHBsYWluXGYwXGZzMjRcbGFuZzEwMzNcaGljaFxmMVxkYmNoXGYxXGxvY2hcZjFcY2Y1XGZzMjJcYiBTdWJqZWN0aXZlOlxiMFxjZWxsDQpcaW50Ymxccm93DQpccGFyZFxzMFxxbFx3aWRjdGxwYXJccGxhaW5cZjBcZnMyNFxsYW5nMTAzM1xoaWNoXGYxXGRiY2h  cZjFcbG9jaFxmMVxjZjVcZnMyMlxiIFBhdGllbnQgSUQ6IFxjZjRcYjAgQW15IFMgUmVuc2hhd1xjZjBccGFyDQpBZ2U6IFxjZjQgNTUgeS5vLlxjZjAgIChET0I6IFxjZjQgMTAvMTMvMTk2MlxjZjAgKVxwYXINClxwYXJkXHMwXHFsXHBsYWluXGYwXGZzMjRcbGFuZzEwMzNcaGljaFxmMVxkYmNoXGYxXGxvY2hcZjFcY2Y0XGZzMjIgR  XRobmljaXR5OiBOb24tSGlzcGFuaWNccGFyDQpSYWNlOiBXaGl0ZSBvciBDYXVjYXNpYW5ccGxhaW5cZjFcZnMyNFxwYXINClxwYXJkXHMwXHFsXHdpZGN0bHBhclxwbGFpblxmMFxmczI0XGxhbmcxMDMzXGhpY2hcZjFcZGJjaFxmMVxsb2NoXGYxXGZzMjIgR2VuZGVyOiBcY2Y0IGZlbWFsZVxjZjBccGFyDQpccGFyZFxzMFxxbFxwbGF  pblxmMFxmczI0XGxhbmcxMDMzXGhpY2hcZjFcZGJjaFxmMVxsb2NoXGYxXGZzMjJccGFyDQpccGFyZFxwbGFpblxmMFxmczI0XGxhbmcxMDMzXGhpY2hcZjFcZGJjaFxmMVxsb2NoXGYxXGNmNVxmczIyXGIgQ2hpZWYgQ29tcGxhaW50OlxjZjBcYjBccGFyDQpcdHJvd2RcdHJnYXBoMFx0cnBhZGRsMFx0cnBhZGRmbDNcdHJwYWRkcjBcd  HJwYWRkZnIzXHRycGFkZHQwXHRycGFkZGZ0M1x0cnBhZGRiMFx0cnBhZGRmYjNcdHJsZWZ0MA0KXGNsdmVydGFsdFxjbG1nZlxjbHBhZHQwXGNscGFkZnQzXGNscGFkcjBcY2xwYWRmcjNcY2xwYWRsMFxjbHBhZGZsM1xjbHBhZGIwXGNscGFkZmIzXGNlbGx4MTAyNjANClxjbG1yZ1xjZWxseDEwMjYwXGNsbXJnXGNlbGx4MTA yNjANClx  wYXJkXGludGJsXHMwXHJpOTBccWxccGxhaW5cZjBcZnMyNFxsYW5nMTAzM1xoaWNoXGYxXGRiY2hcZjFcbG9jaFxmMVxjZjZcZnMxOFxiIENoaWVmIENvbXBsYWludFxjZjRcZnMyMFxiMFxjZWxsXGNlbGxcY2VsbA0KXGludGJsXHJvdw0KXHRyb3dkXHRyZ2FwaDBcdHJwYWRkbDBcdHJwYWRkZmwzXHRycGFkZHIwXHRycGFkZGZyM1x0c  nBhZGR0MFx0cnBhZGRmdDNcdHJwYWRkYjBcdHJwYWRkZmIzXHRybGVmdDANClxjbHZlcnRhbHRcY2xtZ2ZcY2xjYnBhdDE0XGNscGFkdDBcY2xwYWRmdDNcY2xwYWRyMFxjbHBhZGZyM1xjbHBhZGwwXGNscGFkZmwzXGNscGFkYjBcY2xwYWRmYjNcY2VsbHgxMDI2MA0KXGNsbXJnXGNlbGx4MTAyNjBcY2xtcmdcY2VsbHgxMDI2MA0KXHB  hcmRcaW50YmxcczBccmk5MFxxbFxwbGFpblxmMFxmczI0XGxhbmcxMDMzXGhpY2hcZjFcZGJjaFxmMVxsb2NoXGYxXGNmNlxmczE4IFBhdGllbnQgcHJlc2VudHMgd2l0aFxjZjRcZnMyMFxjZWxsXGNlbGxcY2VsbA0KXGludGJsXHJvdw0KXHRyb3dkXHRyZ2FwaDBcdHJwYWRkbDBcdHJwYWRkZmwzXHRycGFkZHIwXHRycGFkZGZyM1x0c  nBhZGR0MFx0cnBhZGRmdDNcdHJwYWRkYjBcdHJwYWRkZmIzXHRybGVmdDANClxjbHZlcnRhbHRcY2xwYWR0MFxjbHBhZGZ0M1xjbHBhZHIwXGNscGFkZnIzXGNscGFkbDBcY2xwYWRmbDNcY2xwYWRiMFxjbHBhZGZiM1xjZWxseDMyNA0KXGNsdmVydGFsdFxjbG1nZlxjbHBhZHQwXGNscGFkZnQzXGNscGFkcjBcY2xwYWRmcjNcY2xwYWR  sMFxjbHBhZGZsM1xjbHBhZGIwXGNscGFkZmIzXGNlbGx4MTAyNjANClxjbG1yZ1xjZWxseDEwMjYwDQpccGFyZFxpbnRibFxzMFxxY1xwbGFpblxmMFxmczI0XGxhbmcxMDMzXGhpY2hcZjFcZGJjaFxmMVxsb2NoXGYxXGNmNFxmczIwXHU4MjI2IFwnOTVcY2VsbA0KXHBhcmRcaW50YmxcczBccmk5MFxxbFxwbGFpblxmMFxmczI0XGxhb  mcxMDMzXGhpY2hcZjFcZGJjaFxmMVxsb2NoXGYxXGNmNFxmczE4IFByZS1vcCBFeGFtXGZzMjBcY2VsbFxjZWxsDQpcaW50Ymxccm93DQpcdHJvd2RcdHJnYXBoMFxsYXN0cm93XHRycGFkZGwwXHRycGFkZGZsM1x0cnBhZGRyMFx0cnBhZGRmcjNcdHJwYWRkdDBcdHJwYWRkZnQzXHRycGFkZGIwXHRycGFkZGZiM1x0cmxlZnQwDQpcY2x  2ZXJ0YWx0XGNscGFkdDBcY2xwYWRmdDNcY2xwYWRyMFxjbHBhZGZyM1xjbHBhZGwwXGNscGFkZmwzXGNscGFkYjBcY2xwYWRmYjNcY2VsbHgzMjQNClxjbHZlcnRhbHRcY2xwYWR0MFxjbHBhZGZ0M1xjbHBhZHIwXGNscGFkZnIzXGNscGFkbDBcY2xwYWRmbDNcY2xwYWRiMFxjbHBhZGZiM1xjZWxseDY0OA0KXGNsdmVydGFsdFxjbHBhZ  HQwXGNscGFkZnQzXGNscGFkcjBcY2xwYWRmcjNcY2xwYWRsMFxjbHBhZGZsM1xjbHBhZGIwXGNscGFkZmIzXGNlbGx4MTAyNjANClxwYXJkXGludGJsXHMwXHFjXHBsYWluXGYwXGZzMjRcbGFuZ zEwMzNcaGljaFxmMVxkYmNoXGYxXGxvY2hcZjFcY2Y0XGZzMjBcY2VsbA0KXHBhcmRcaW50YmxcczBccWNccGxhaW5cZjBcZnMyNFxsYW5  nMTAzM1xoaWNoXGYxXGRiY2hcZjFcbG9jaFxmMVxjZjRcZnMyMFxjZWxsDQpccGFyZFxpbnRibFxzMFxyaTkwXHFsXHBsYWluXGYwXGZzMjRcbGFuZzEwMzNcaGljaFxmMVxkYmNoXGYxXGxvY2hcZjFcY2Y0XGZzMjBcaSBMVCBUS0Egc2NoZWR1bGVkIDExLzMwLzE3XGkwXGNlbGwNClxpbnRibFxyb3cNClxwYXJkXHBsYWluXGYwXGZzM  jRcbGFuZzEwMzNcaGljaFxmMVxkYmNoXGYxXGxvY2hcZjFcZnMyMlxwYXINClxwYXJkXHMwXHFsXHdpZGN0bHBhclxwbGFpblxmMFxmczI0XGxhbmcxMDMzXGhpY2hcZjFcZGJjaFxmMVxsb2NoXGYxXGZzMjJccGFyDQpccGFyZFxzMFxxbFxwbGFpblxmMFxmczI0XGxhbmcxMDMzXGhpY2hcZjFcZGJjaFxmMVxsb2NoXGYxXGNmNFxmczI  yICBcY2YwXHBhcg0KXHBhcmRccGxhaW5cZjBcZnMyNFxsYW5nMTAzM1xoaWNoXGYxXGRiY2hcZjFcbG9jaFxmMVxmczIyXHBhcg0KXGNmNCBIUElcY2YwIDpTaGUgaXMgaGVyZSB0byBtZWV0IHdpdGggbWUgYW5kIGRpc2N1c3NlZCBoZXIgdXBjb21pbmcgbGVmdCB0b3RhbCBrbmVlIHJlcGxhY2VtZW50IGZvciBOb3ZlbWJlciAzMCwgM  jAxNy4gIFNoZSBub3RlcyB0aGF0IHNoZSBkaWQgZmFsbCBkb3duIHRoZSBzdGVwcyBpbiBlYXJseSBKdW5lLiAgU2hlIGlzIGEgYnJlYXN0IGNhbmNlciBzdXJ2aXZvciBhbmQgY3VycmVudGx5IGhhcyAzIERWVHMuICBTaGUgd2FzIG9uIGEgDQptZWRpY2F0aW9uIHRoYXQgdGhleSB3b25kZXIgaWYgdGhhdCBjb3VsZCd2ZSBjYXVzZWQ  gdGhlc2UgcmVzaWR1YWwgYmxvb2QgY2xvdHMuICBTaGUgaXMgd29uZGVyaW5nIGlmIGhlciBzdXJnZXJ5IHdpbGwgaGF2ZSB0byBiZSBjYW5jZWxlZC5ccGFyDQpccGFyDQpccGFyDQpcY2Y0IE9ydGhvIEV4YW1cY2YwIDogTGVmdCBrbmVlIGhhcyBtaWxkIHdhcm10aCBhbmQgc3dlbGxpbmcsIG5vIGVyeXRoZW1hIG9yIGVmZnVzaW9uL  iAgUmFuZ2Ugb2YgbW90aW9uIGlzIGRlY3JlYXNlZCBhbmQgcGFpbmZ1bCBhdCBleHRyZW1lcy4gIFNoZSBhbWJ1bGF0ZXMgd2l0aCBhIGxpbXAuICBBbGVydCBhbmQgb3JpZW50ZWQsIG5ldXJvbG9naWNhbGx5IGludGFjdC5ccGFyDQpccGFyDQpccGFyDQpcY2Y0IFJPU1xjZjAgOlxwYXINClxwYXINClxxbFxwbGFpblxmMFxmczI0XGx  hbmcxMDMzXGhpY2hcZjFcZGJjaFxmMVxsb2NoXGYxXGNmNFxmczIyIFJldmlldyBvZiBTeXN0ZW1zXHBhcg0KTXVzY3Vsb3NrZWxldGFsOiBKb2ludCBQYWluLCBLbmVlIFBhaW5ccGFyDQpDb25zdGl0dXRpb25hbDogKC0pIEZldmVyLCAoLSkgQ2hpbGxzXHBhcg0KQ2FyZGlvdmFzY3VsYXI6ICgtKSBMZWcgU3dlbGxpbmdcY2YwXHBhc  g0KXHBhcmRccGxhaW5cZjBcZnMyNFxsYW5nMTAzM1xoaWNoXGY xXGRiY2hcZjFcbG9jaFxmMVxmczIyXHBhcg0KXHBhcg0KXGNmNVxiIFBhdGllbnQgVml0YWxzOlxjZjBcYjBccGFyDQpccWxccGxhaW5cZjBcZnMyNFxsYW5nMTAzM1xoaWNoXGYxXGRiY2hcZjFcbG9jaFxmMVxjZjRcZnMyMiBWaXRhbHNccGFyDQpIZWlnaHQ6IDUnIDg  iICgxNzIuNyBjbSlccGFyDQpXZWlnaHQ6IDIxNyBsYiAoOTguNCBrZylcY2YwXHBhcg0KXHBhcmRccGxhaW5cZjBcZnMyNFxsYW5nMTAzM1xoaWNoXGYxXGRiY2hcZjFcbG9jaFxmMVxmczIyXHBhcg0Ke1wqXGJrbWtzdGFydCBNUl9DT01TVU1eYGBeI2BCRUdJTl5gYF4jYEFzc2Vzc21lbnReYGBeI2AyXmBgXiNgMjAyMjY4Ny4wNzk5N  jZeYGBeI2AwXmBgXiNgM15gYF4jYH17XCpcYmtta2VuZCBNUl9DT01TVU1eYGBeI2BCRUdJTl5gYF4jYEFzc2Vzc21lbnReYGBeI2AyXmBgXiNgMjAyMjY4Ny4wNzk5NjZeYGBeI2AwXmBgXiNgM15gYF4jYH17XCpcYmtta3N0YXJ0IFNFQ1RJT12fQk9PS01BUktfRU5EXmBgXiNgTVJfQ09NU1VNXmBgXiNgQkVHSU5eYGBeI2BBc3Nlc3N  tZW50XmBgXiNgMl5gYF4jYDIwMjI2ODcuMDc5OTY2XmBgXiNgMF5gYF4jYDNeYGBeI2B9e1wqXGJrbWtlbmQgDQpTRUNUSU9OX0JPT0tNQVJLX0VORF5gYF4jYE1SX0NPTVNVTV5gYF4jYEJFR0lOXmBgXiNgQXNzZXNzbWVudF5gYF4jYDJeYGBeI2AyMDIyNjg3LjA3OTk2Nl5gYF4jYDBeYGBeI2AzXmBgXiNgfVxjZjRcZnMyMCAgXGNmM  FxmczIyXHBhcg0KXHRyb3dkXHRyZ2FwaDBcdHJwYWRkbDBcdHJwYWRkZmwzXHRycGFkZHIwXHRycGFkZGZyM1x0cnBhZGR0MFx0cnBhZGRmdDNcdHJwYWRkYjBcdHJwYWRkZmIzXHRybGVmdDBcdHJyaDI0NQ0KXGNsdmVydGFsdFxjbGNicGF0MTFcY2xwYWR0MFxjbHBhZGZ0M1xjbHBhZHIwXGNscGFkZnIzXGNscGFkbDBcY2xwYWRmbDN  cY2xwYWRiMFxjbHBhZGZiM1xjZWxseDk1MDANClxwYXJkXGludGJsXHMwXHFsXHdpZGN0bHBhclxwbGFpblxmMFxmczI0XGxhbmcxMDMzXGhpY2hcZjFcZGJjaFxmMVxsb2NoXGYxXGNmNVxmczIyXGIgWC1SYXkvSW5qZWN0aW9uIEluIENsaW5pYyBQcm9jZWR1cmUgTm90ZXNcYjBcY2VsbA0KXGludGJsXHJvdw0KXHBhcmRccGxhaW5cZ  jBcZnMyNFxsYW5nMTAzM1xoaWNoXGYxXGRiY2hcZjFcbG9jaFxmMVxmczIyXHBhcg0KXHBhcmRcczBccWxcd2lkY3RscGFyXHBsYWluXGYwXGZzMjRcbGFuZzEwMzNcaGljaFxmMVxkYmNoXGYxXGxvY2hcZjFcZnMyMlxwYXINClxwYXJkXHBsYWluXGYwXGZzMjRcbGFuZzEwMzNcaGljaFxmMVxkYmNoXGYxXGxvY2hcZjFcZnMyMlxwYXI  NClx0cm93ZFx0cmdhcGgwXGxhc3Ryb3dcdHJwYWRkbDBcdHJwYWRkZmwzXHRycGFkZHIwXHRycGFkZGZyM1x0cnBhZGR0MFx0cnBhZGRmdDNcdHJwYWRkYjBcdHJwYWRkZmIzXHRybGVmdDBcdHJyaDMwMFxsdHJyb3cNClxjbHZlcnRhbGJcY2xwYWR0MFxjbHBhZGZ0M1xjbHB hZHIwXGNscGFkZnIzXGNscGFkbDBcY2xwYWRmbDNcY2xwY  WRiMFxjbHBhZGZiM1xjZWxseDEwODAwDQpccGFyZFxpbnRibFxzMFxzbDI2XHFsXGtlZXBuXHBsYWluXGYwXGZzMjRcbGFuZzEwMzNcaGljaFxmMVxkYmNoXGYxXGxvY2hcZjFcY2Y3XGZzMjJcbHRyY2hcYiBJbiBDbGluaWMgQWRtaW5pc3RlcmVkIE1lZHMgQmlsbGluZyBEYXRhXHBsYWluXGxhbmcxMDMzXGhpY2hcZjFcZGJjaFxmMVx  sb2NoXGYxXGNmNFxmczIyICBcY2VsbA0KXGludGJsXHJvdw0KXHBhcmRcczBcbGkyNzBcbHRycGFyXHFsXHBsYWluXGYwXGZzMjRcbGFuZzEwMzNcaGljaFxmMVxkYmNoXGYxXGxvY2hcZjFcY2Y0XGZzMjAgTm9uZVxwYXINClx0cm93ZFx0cmdhcGgwXGxhc3Ryb3dcdHJwYWRkbDBcdHJwYWRkZmwzXHRycGFkZHIwXHRycGFkZGZyM1x0c  nBhZGR0MFx0cnBhZGRmdDNcdHJwYWRkYjBcdHJwYWRkZmIzXHRybGVmdDANClxjbHZlcnRhbHRcY2xwYWR0MFxjbHBhZGZ0M1xjbHBhZHIwXGNscGFkZnIzXGNscGFkbDBcY2xwYWRmbDNcY2xwYWRiMFxjbHBhZGZiM1xjZWxseDEwODAwDQpccGFyZFxpbnRibFxzMFxzbDEyXHFsXHBsYWluXGYwXGZzMjRcbGFuZzEwMzNcaGljaFxmMVx  kYmNoXGYxXGxvY2hcZjFcY2Y0XGZzMTJcY2VsbA0KXGludGJsXHJvdw0KXHBhcmRccGxhaW5cZjBcZnMyNFxsYW5nMTAzM1xoaWNoXGYxXGRiY2hcZjFcbG9jaFxmMVxmczIyXHBhcg0KXHBhcmRcczBccWxcd2lkY3RscGFyXHBsYWluXGYwXGZzMjRcbGFuZzEwMzNcaGljaFxmMVxkYmNoXGYxXGxvY2hcZjFcZnMyMlxwYXINClx0cm93Z  Fx0cmdhcGgwXHRycGFkZGwwXHRycGFkZGZsM1x0cnBhZGRyMFx0cnBhZGRmcjNcdHJwYWRkdDBcdHJwYWRkZnQzXHRycGFkZGIwXHRycGFkZGZiM1x0cmxlZnQwXHRycmgyNDUNClxjbHZlcnRhbHRcY2xjYnBhdDExXGNscGFkdDBcY2xwYWRmdDNcY2xwYWRyMFxjbHBhZGZyM1xjbHBhZGwwXGNscGFkZmwzXGNscGFkYjBcY2xwYWRmYjN  cY2VsbHg5NTAwDQpccGFyZFxpbnRibFxzMFxxbFx3aWRjdGxwYXJccGxhaW5cZjBcZnMyNFxsYW5nMTAzM1xoaWNoXGYxXGRiY2hcZjFcbG9jaFxmMVxjZjVcZnMyMlxiIEFzc2Vzc21lbnQvSW1wcmVzc2lvbjpcYjBcY2VsbA0KXGludGJsXHJvdw0KXHBhcmRccGxhaW5cZjBcZnMyNFxsYW5nMTAzM1xoaWNoXGYxXGRiY2hcZjFcbG9ja  FxmMVxmczE4XHBhcg0KXHRyb3dkXHRyZ2FwaDBcdHJwYWRkbDBcdHJwYWRkZmwzXHRycGFkZHIwXHRycGFkZGZyM1x0cnBhZGR0MFx0cnBhZGRmdDNcdHJwYWRkYjBcdHJwYWRkZmIzXHRybGVmdDANClxjbHZlcnRhbHRcY2xtZ2ZcY2xwYWR0MFxjbHBhZGZ0M1xjbHBhZHIwXGNscGFkZnIzXGNscGFkbDBcY2xwYWRmbDNcY2xwYWRiMFx  jbHBhZGZiM1xjZWxseDEwMjYwDQpcY2xtcmdcY2VsbHgxMDI2MFxjbG1yZ1xjZWxseDEwMjYwXGNsbXJnXGNlbGx4MTAyNjANClxwYXJkXGlud GJsXHMwXHJpOTBccWxccGxhaW5cZjBcZnMyNFxsYW5nMTAzM1xoaWNoXGYxXGRiY2hcZjFcbG9jaFxmMVxjZjZcZnMxOFxiIEVuY291bnRlciBEaWFnbm9zZXNcY2Y0XGZzMjBcYjBcY2Vsb  FxjZWxsXGNlbGxcY2VsbA0KXGludGJsXHJvdw0KXHRyb3dkXHRyZ2FwaDBcdHJwYWRkbDBcdHJwYWRkZmwzXHRycGFkZHIwXHRycGFkZGZyM1x0cnBhZGR0MFx0cnBhZGRmdDNcdHJwYWRkYjBcdHJwYWRkZmIzXHRybGVmdDANClxjbHZlcnRhbHRcY2xtZ2ZcY2xjYnBhdDE0XGNscGFkdDBcY2xwYWRmdDNcY2xwYWRyMFxjbHBhZGZyM1x  jbHBhZGwwXGNscGFkZmwzXGNscGFkYjBcY2xwYWRmYjNcY2VsbHgxNjIwDQpcY2xtcmdcY2VsbHgxNjIwDQpcY2x2ZXJ0YWx0XGNsY2JwYXQxNFxjbHBhZHQwXGNscGFkZnQzXGNscGFkcjBcY2xwYWRmcjNcY2xwYWRsMFxjbHBhZGZsM1xjbHBhZGIwXGNscGFkZmIzXGNlbGx4OTE4MA0KXGNsdmVydGFsdFxjbGNicGF0MTRcY2xwYWR0M  FxjbHBhZGZ0M1xjbHBhZHIwXGNscGFkZnIzXGNscGFkbDBcY2xwYWRmbDNcY2xwYWRiMFxjbHBhZGZiM1xjZWxseDEwMjYwDQpccGFyZFxpbnRibFxzMFxyaTkwXHFsXHBsYWluXGYwXGZzMjRcbGFuZzEwMzNcaGljaFxmMVxkYmNoXGYxXGxvY2hcZjFcY2Y2XGZzMTggQ29kZVxjZjRcZnMyMFxjZWxsXGNlbGwNClxwYXJkXGludGJsXHM  wXHJpOTBccWxccGxhaW5cZjBcZnMyNFxsYW5nMTAzM1xoaWNoXGYxXGRiY2hcZjFcbG9jaFxmMVxjZjZcZnMxOCBOYW1lXGNmNFxmczIwXGNlbGwNClxwYXJkXGludGJsXHMwXHJpOTBccWxccGxhaW5cZjBcZnMyNFxsYW5nMTAzM1xoaWNoXGYxXGRiY2hcZjFcbG9jaFxmMVxjZjZcZnMxOCBQcmltYXJ5P1xjZjRcZnMyMFxjZWxsDQpca  W50Ymxccm93DQpcdHJvd2RcdHJnYXBoMFxsYXN0cm93XHRycGFkZGwwXHRycGFkZGZsM1x0cnBhZGRyMFx0cnBhZGRmcjNcdHJwYWRkdDBcdHJwYWRkZnQzXHRycGFkZGIwXHRycGFkZGZiM1x0cmxlZnQwDQpcY2x2ZXJ0YWx0XGNscGFkdDBcY2xwYWRmdDNcY2xwYWRyMFxjbHBhZGZyM1xjbHBhZGwwXGNscGFkZmwzXGNscGFkYjBcY2x  wYWRmYjNcY2VsbHgzMjQNClxjbHZlcnRhbHRcY2xwYWR0MFxjbHBhZGZ0M1xjbHBhZHIwXGNscGFkZnIzXGNscGFkbDBcY2xwYWRmbDNcY2xwYWRiMFxjbHBhZGZiM1xjZWxseDE2MjANClxjbHZlcnRhbHRcY2xwYWR0MFxjbHBhZGZ0M1xjbHBhZHIwXGNscGFkZnIzXGNscGFkbDBcY2xwYWRmbDNcY2xwYWRiMFxjbHBhZGZiM1xjZWxse  DkxODANClxjbHZlcnRhbHRcY2xwYWR0MFxjbHBhZGZ0M1xjbHBhZHIwXGNscGFkZnIzXGNscGFkbDBcY2xwYWRmbDNcY2xwYWRiMFxjbHBhZGZiM1xjZWxseDEwMjYwDQpccGFyZFxpbnRibFxzMFxxY1xwbGFpblxmMFxmczI0XGxhbmcxMDMzXGhpY2hcZjFcZGJjaFxmMVxsb2NoXGYxXGNmNFxmczIwXHU4MjI2IFwnOTVcY2VsbA0KXHB  hcmRcaW50Ymx cczBccmk5MFxxbFxwbGFpblxmMFxmczI0XGxhbmcxMDMzXGhpY2hcZjFcZGJjaFxmMVxsb2NoXGYxXGNmNFxmczE4IE0xNy4xMlxmczIwXGNlbGwNClxwYXJkXGludGJsXHMwXHJpOTBccWxccGxhaW5cZjBcZnMyNFxsYW5nMTAzM1xoaWNoXGYxXGRiY2hcZjFcbG9jaFxmMVxjZjRcZnMxOCBQcmltYXJ5IG9zdGVvYXJ0a  HJpdGlzIG54mIGxlZnQga25lZVxmczIwXGNlbGwNClxwYXJkXGludGJsXHMwXHJpOTBccWxccGxhaW5cZjBcZnMyNFxsYW5nMTAzM1xoaWNoXGYxXGRiY2hcZjFcbG9jaFxmMVxjZjRcZnMxOCBZZXNcZnMyMFxjZWxsDQpcaW50Ymxccm93DQpccGFyZFxwbGFpblxmMFxmczI0XGxhbmcxMDMzXGhpY2hcZjFcZGJjaFxmMVxsb2NoXGYxXGZ  zMjJccGFyDQpccGFyDQpccGFyDQpcdHJvd2RcdHJnYXBoMFx0cnBhZGRsMFx0cnBhZGRmbDNcdHJwYWRkcjBcdHJwYWRkZnIzXHRycGFkZHQwXHRycGFkZGZ0M1x0cnBhZGRiMFx0cnBhZGRmYjNcdHJsZWZ0MFx0cnJoMjQ1DQpcY2x2ZXJ0YWx0XGNsY2JwYXQxMVxjbHBhZHQwXGNscGFkZnQzXGNscGFkcjBcY2xwYWRmcjNcY2xwYWRsM  FxjbHBhZGZsM1xjbHBhZGIwXGNscGFkZmIzXGNlbGx4OTUwMA0KXHBhcmRcaW50YmxcczBccWxcd2lkY3RscGFyXHBsYWluXGYwXGZzMjRcbGFuZzEwMzNcaGljaFxmMVxkYmNoXGYxXGxvY2hcZjFcY2Y1XGZzMjJcYiBQbGFuOlxiMFxjZWxsDQpcaW50Ymxccm93DQpccGFyZFxzMFxxbFxwbGFpblxmMFxmczI0XGxhbmcxMDMzXGhpY2h  cZjFcZGJjaFxmMVxsb2NoXGYxXGNmNFxmczIyIE5vIG9yZGVycyBvZiB0aGUgZGVmaW5lZCB0eXBlcyB3ZXJlIHBsYWNlZCBpbiB0aGlzIGVuY291bnRlci5ccGFyDQpcY2YwXHBhcg0KSSByZXZpZXdlZCBoZXIgRG9wcGxlciBzdHVkaWVzIGFuZCBhZHZpc2VkIHRoYXQgd2UgYXJlIGdvaW5nIHRvIGhhdmUgdG8gY2FuY2VsIGhlciBzd  XJnZXJ5LiAgVGhlcmUgaXMgcHJvZ3Jlc3Npb24gb2YgdGhlIHRocm9tYm9zaXMuICBJbiBBdWd1c3QgaXQgd2FzIGEgc21hbGwgY2xvdC4gIEVucmlxdWUgcGFzdCB0aGUgb25lcyB0aGF0IHdlcmUgdGhlcmUgaGF2ZSBwcm9ncmVzc2VkIGluc3RlYWQgb2YgZGlzc2lwYXRpbmcuICBJIGFkdmlzZWQgDQp0aGF0IHNoZSByZXR1cm4gdG8  gRHIuIENvZHkuICBJbiB0aGUgZnV0dXJlIGlmIHNoZSBnZXRzIGNsZWFyZWQgZm9yIHN1cmdlcnkgd2UgbWF5IGNvbnNpZGVyIElWQyBmaWx0ZXIgcGxhY2VtZW50LiAgQWxsIHF1ZXN0aW9ucyBhbnN3ZXJlZCBhbmQgdW5kZXJzdG9vZC4gIFNoZSB3aWxsIGNhbGwgb25jZSBzaGUgaGFzIGNsZWFyZWQgdG8gaGF2ZSBzdXJnZXJ5IGFnY  WluLlxwYXINClxwYXJkXHMwXHFsXHdpZGN0bHBhclxwbGFpblxmMFxmczI0XGxhbmcxMDMzXGhpY2hcZjFcZGJjaFxmMVxsb2NoXGYxXGZzMjJccGFyDQpJLCBKZW5uaWZlciBCcmF1biwgUk1BIGFtIHNjcmliaW5nIGZvciw gYW5kIGluIHRoZSBwcmVzZW5jZSBvZiBEci4gRWR3YXJkIExpbSwgaW5jbHVkaW5nIHhyYXkgaW50ZXJwcmV  0YXRpb24gaWYgcGVyZm9ybWVkLlxoaWNoXGYwXGRiY2hcZjBcbG9jaFxmMFxmczI0XHN1cGVyIFtKQi4xXVxwbGFpblxsYW5nMTAzM1xoaWNoXGYxXGRiY2hcZjFcbG9jaFxmMVxmczIyXHBhcg0KXHBhcg0KSSwgRWR3YXJkIExpbSwgTUQgcGVyc29uYWxseSBwZXJmb3JtZWQgdGhlIHNlcnZpY2VzIGRlc2NyaWJlZCBpbiB0aGUgZG9jd  W1lbnRhdGlvbiBhcyBzY3JpYmVkIGJ5IEplbm5pZmVyIEJyYXVuLCBSTUEgaW4gbXkgcHJlc2VuY2UsIGFuZCBjb59maXJtIGl0IGlzIGJvdGggYWNjdXJhdGUgYW5kIGNvbXBsZXRlLlxoaWNoXGYwXGRiY2hcZjBcbG9jaFxmMFxmczI0XHN1cGVyIFtFTC4xXVxwbGFpblxsYW5nMTAzM1xoaWNoXGYxXGRiY2hcZjFcbG9jaFxmMVxmczI  yXHBhcg0KXHBhcg0KUGxlYXNlIG5vdGUgdGhhdCBkb2N1bWVudGF0aW9uIHdhcyBjb21wbGV0ZWQgdXNpbmcgZHJhZ29uIHNwZWFrIGFuZCBzb21lIGVycm9ycyBtYXkgaGF2ZSBvY2N1cnJlZCBhbmQgaGF2ZSBub3QgYmVlbiBpZGVudGlmaWVkLiAgUGxlYXNlIGNoZWNrIHRoZSBjaGFydCBmb3IgYWRkZW5kdW1zLiAgSWYgdGhlcmUgY  XJlIG1ham9yIGVycm9ycyB0aGF0IHlvdSBoYXZlIGNvbmNlcm4gYWJvdXQgcGxlYXNlIGNvbnRhY3QgdGhlIG28mZmljZSBmb3IgDQpjbGFyaWZpY2F0aW9uLlxoaWNoXGYwXGRiY2hcZjBcbG9jaFxmMFxmczI0XHN1cGVyIFtKQi4xXVxwbGFpblxsYW5nMTAzM1xoaWNoXGYxXGRiY2hcZjFcbG9jaFxmMVxmczIyXHBhcg0KXHBhcg0KXHB  hcmRccGxhaW5cZjBcZnMyNFxsYW5nMTAzM1xoaWNoXGYxXGRiY2hcZjFcbG9jaFxmMVxmczIyXHBhcg0KXHBhcmRcczBccWxcd2lkY3RscGFyXHBsYWluXGYwXGZzMjRcbGFuZzEwMzNcaGljaFxmMVxkYmNoXGYxXGxvY2hcZjFcZnMyMlxwYXINClxwYXJkXHMwXHFsXHBsYWluXGYwXGZzMjRcbGFuZzEwMzNcaGljaFxmMVxkYmNoXGYxX  GxvY2hcZjFcZnMxOFxwYXINClxsaTI4MFxsdHJwYXJccWxccGxhaW5cZjBcZnMyNFxoaWNoXGYxXGRiY2hcZjFcbG9jaFxmMVxjZjRcZnMyMCAgXGNmMFxwYXINClxjZjMgRWxlY3Ryb25pY2FsbHkgc2lnbmVkIGJ5IExpbSwgRWR3YXJkIFYuQS4sIE1EIG9uIDExLzE2LzE3IDE1NTMuXGNmMFxwYXINClx0cm93ZFx0cmdhcGgwXGxhc3R  yb3dcdHJsZWZ0MA0KXGNsdmVydGFsdFxjZWxseDIxNg0KXGNsdmVydGFsdFxjZWxseDEwODAwDQpccGFyZFxpbnRibFxzMFxzbDI0XHFsXHBsYWluXGYwXGZzMjRcaGljaFxmMVxkYmNoXGYxXGxvY2hcZjFcZnMyMFxjZWxsDQpccGFyZFxpbnRibFxpdGFwMlxzMFxzbDI0XHFsXHBsYWluXGYwXGZzMjRcaGljaFxmMVxkYmNoXGYxXGxvY  2hcZjFcY2YyXGZzMjBcbHRyY2ggQXR0cmlidXRpb24gS2V5XHBsYWluXGhpY2hcZjFcZGJja FxmMVxsb2NoXGYxXGZzMjAgIFxuZXN0Y2VsbHtcbm9uZXN0dGFibGVzXHBhcn0NClxpbnRibFxpdGFwMg0Ke1xsaXN0dGV4dFxwYXJkXHBsYWluXGYxXGZzMjBcdGFifQ0Ke3tcKlxuZXN0dGFibGVwcm9wc1x0cm93ZFx0cmdhcGgwXGxhc3R  yb3dcdHJsZWZ0ODBcdHJyaDI4MFxsdHJyb3cNClxjbHZlcnRhbGJcY2xicmRyYlxicmRyc1xicmRydzIwXGJyZHJjZjJcY2xjYnBhdDFcY2VsbHgxMDUwNA0KXG5lc3Ryb3d9e1xub25lc3R0YWJsZXNccGFyfX0NClx0cm93ZFx0cmdhcGgwXGxhc3Ryb3dcdHJsZWZ0MA0KXGNsdmVydGFsdFxjZWxseDIxNg0KXGNsdmVydGFsdFxjZWxse  DEwODAwDQpccGFyZFxpbnRibFxzMFxsczFcaWx2bDBcZmktMTAwXGxpMjAwXHFsXHBsYWluXGYwXGZzMjRcaGljaFxmMVxkYmNoXGYxXGxvY2hcZjFcZnMyMCBFTC4xIC0gTGltLCBFZHdhcmQgVi5BLiwgTUQgb24gMTEvMTYvMTcgMTUyMFxwYXINCntcbGlzdHRleHRccGFyZFxwbGFpblxmMVxmczIwXHRhYn0NCkpCLjEgLSBCcmF1biw  gSmVubmlmZXIgTC4sIFJNQSBvbiAxMS8xNC8xNyAxMTM5XHBhcg0Ke1xsaXN0dGV4dFxwYXJkXHBsYWluXGYxXGZzMjBcdGFifQ0KXGNmM1xwYXINClxwYXJkXGludGJsXHMwXHFsXHBsYWluXGYwXGZzMjRcaGljaFxmMVxkYmNoXGYxXGxvY2hcZjFcZnMyMFxwYXINClxwYXJkXGludGJsXGl0YXAyXHMwXHNsMTJccWxccGxhaW5cZjBcZ  nMyNFxoaWNoXGYxXGRiY2hcZjFcbG9jaFxmMVxmczEyXG5lc3RjZWxse1xub25lc3R0YWJsZXNccGFyfQ0KXGludGJsXGl0YXAyDQp7e1wqXG5lc3R0YWJsZXByb3BzXHRyb3dkXHRyZ2FwaDBcbGFzdHJvd1x0cmxlZnQ4MA0KXGNsdmVydGFsdFxjbGNicGF0MVxjZWxseDEwNTA0DQpcbmVzdHJvd317XG5vbmVzdHRhYmxlc1xwYXJ74fQ0  KXHRyb3dkXHRyZ2FwaDBcbGFzdHJvd1x0cmxlZnQwDQpcY2x2ZXJ0YWx0XGNlbGx4MjE2DQpcY2x2ZXJ0YWx0XGNlbGx4MTA4MDANClxwYXJkXGludGJsXHMwXGxpODBccmk4MFxzbDI0XHFsXHBsYWluXGYwXGZzMjRcaGljaFxmMVxkYmNoXGYxXGxvY2hcZjFcZnMyMFxjZWxsDQpcaW50Ymxccm93DQpcdHJvd2RcdHJnYXBoMFxsYXN0c  m93XHRybGVmdDBcdHJrZWVwDQpcY2x2ZXJ0YWx0XGNlbGx4MjE2DQpcY2x2ZXJ0YWx0XGNlbGx4MTA4MDANClxwYXJkXGludGJsXHMwXHNsMjRccWxca2VlcG5ccGxhaW5cZjBcZnMyNFxoaWNoXGYxXGRiY2hcZjFcbG9jaFxmMVxmczIwXGNlbGwNClxwYXJkXGludGJsXGl0YXAyXHMwXHNsMjRccWxccGxhaW5cZjBcZnMyNFxoaWNoXGY  xXGRiY2hcZjFcbG9jaFxmMVxjZjJcZnMyMFxsdHJjaCBSZXZpc2lvbiBIaXN0b3J5XHBsYWluXGhpY2hcZjFcZGJjaFxmMVxsb2NoXGYxXGZzMjAgIFxuZXN0Y2VsbHtcbm9uZXN0dGFibGVzXHBhcn0NClxpbnRibFxpdGFwMg0Ke3tcKlxuZXN0dGFibGVwcm9wc1x0cm93ZFx0cmdhcGgwXGxhc3Ryb3dcd HJsZWZ0ODBcdHJyaDI4MFxsd  HJyb3cNClxjbHZlcnRhbGJcY2xicmRyYlxicmRyc1xicmRydzIwXGJyZHJjZjJcY2xjYnBhdDFcY2VsbHgxMDUwNA0KXG5lc3Ryb3d9e1xub25lc3R0YWJsZXNccGFyfX0NClxwYXJkXGludGJsXGl0YXAyXHMwXGxpODBccmk4MFxzbDIyXHFsXGtlZXBuXHBsYWluXGYwXGZzMjRcaGljaFxmMVxkYmNoXGYxXGxvY2hcZjFcZnMyMlxuZXN  0Y2VsbHtcbm9uZXN0dGFibGVzXHBhcn0NClxwYXJkXGludGJsXGl0YXAyXHMwXGxpODBccmk4MFxzbDIyXHFsXGtlZXBuXHBsYWluXGYwXGZzMjRcaGljaFxmMVxkYmNoXGYxXGxvY2hcZjFcY2Y0XGZzMlxwYXINClxjZjBcZnMyMlxuZXN0Y2VsbHtcbm9uZXN0dGFibGVzXHBhcn0NClxwYXJkXGludGJsXGl0YXAyXHMwXGxpODBccmk4M  FxzbDIyXHFsXGtlZXBuXHBsYWluXGYwXGZzMjRcaGljaFxmMVxkYmNoXGYxXGxvY2hcZjFcY2Y0XGZzMjAgRGF0ZS9UaW1lXGNmMFxmczIyXG5lc3RjZWxse1xub25lc3R0YWJsZXNccGFyfQ0KXHBhcmRcaW50YmxcaXRhcDJcczBcbGk4MFxyaTgwXHNsMjJccWxca2VlcG5ccGxhaW5cZjBcZnMyNFxoaWNoXGYxXGRiY2hcZjFcbG9jaFx  mMVxjZjRcZnMyMCBVc2VyXGNmMFxmczIyXG5lc3RjZWxse1xub25lc3R0YWJsZXNccGFyfQ0KXHBhcmRcaW50YmxcaXRhcDJcczBcbGk4MFxyaTgwXHNsMjJccWxca2VlcG5ccGxhaW5cZjBcZnMyNFxoaWNoXGYxXGRiY2hcZjFcbG9jaFxmMVxjZjRcZnMyMCBQcm92aWRlciBUeXBlXGNmMFxmczIyXG5lc3RjZWxse1xub25lc3R0YWJsZ  XNccGFyfQ0KXHBhcmRcaW50YmxcaXRhcDJcczBcbGk4MFxyaTgwXHNsMjJccWxca2VlcG5ccGxhaW5cZjBcZnMyNFxoaWNoXGYxXGRiY2hcZjFcbG9jaFxmMVxjZjRcZnMyMCBBY3Rpb25cY2YwXGZzMjJcbmVzdGNlbGx7XG5vbmVzdHRhYmxlc1xwYXJ9DQpcaW50YmxcaXRhcDINCnt7XCpcbmVzdHRhYmxlcHJvcHNcdHJvd2RcdHJnYXB  oMFx0cmxlZnQ4MFx0cmtlZXBcbHRycm93DQpcY2x2ZXJ0YWx0XGNsY2JwYXQxMlxjZWxseDI4OA0KXGNsdmVydGFsYlxjbGNicGF0MTJcY2VsbHg0OTYNClxjbHZlcnRhbGJcY2xjYnBhdDEyXGNlbGx4MjQ3Ng0KXGNsdmVydGFsYlxjbGNicGF0MTJcY2VsbHg1MDgyDQpcY2x2ZXJ0YWxiXGNsY2JwYXQxMlxjZWxseDc2ODgNClxjbHZlc  nRhbGJcY2xjYnBhdDEyXGNlbGx4MTA1MDINClxuZXN0cm56fXtcbm9uZXN0dGFibGVzXHBhcn19DQpccGFyZFxpbnRibFxpdGFwMlxzMFxsaS0yMFxzbDI0XHFsXGtlZXBuXHBsYWluXGYwXGZzMjRcaGljaFxmMVxkYmNoXGYxXGxvY2hcZjFcZnMyMFxuZXN0Y2VsbHtcbm9uZXN0dGFibGVzXHBhcn0NClxwYXJkXGludGJsXGl0YXAyXHM  wXGxpODBccmk4MFxzbDI0XHFsXGtlZXBuXHBsYWluXGYwXGZzMjRcaGljaFxmMVxkYmNoXGYxXGxvY2hcZjFcY2Y0XGZzMjAgPlxjZjBcbmVzdGNlbGx7XG5vbmVzdHRhYmx lc1xwYXJ9DQpccGFyZFxpbnRibFxpdGFwMlxzMFxsaTgwXHJpODBcc2wyNFxxbFxrZWVwblxwbGFpblxmMFxmczI0XGhpY2hcZjFcZGJjaFxmMVxsb2NoXGYxX  GNmNFxmczIwIDExLzE2LzE3IDE1MjFcY2YwXG5lc3RjZWxse1xub25lc3R0YWJsZXNccGFyfQ0KXHBhcmRcaW50YmxcaXRhcDJcczBcbGk4MFxyaTgwXHNsMjRccWxca2VlcG5ccGxhaW5cZjBcZnMyNFxoaWNoXGYxXGRiY2hcZjFcbG9jaFxmMVxjZjRcZnMyMCBMaW0sIEVkd2FyZCBWLkEuLCBNRFxjZjBcbmVzdGNlbGx7XG5vbmVzdHR  hYmxlc1xwYXJ9DQpccGFyZFxpbnRibFxpdGFwMlxzMFxsaTgwXHJpODBcc2wyNFxxbFxrZWVwblxwbGFpblxmMFxmczI0XGhpY2hcZjFcZGJjaFxmMVxsb2NoXGYxXGNmNFxmczIwIFBoeXNpY2lhblxjZjBcbmVzdGNlbGx7XG5vbmVzdHRhYmxlc1xwYXJ9DQpccGFyZFxpbnRibFxpdGFwMlxzMFxsaTgwXHJpODBcc2wyNFxxbFxrZWVwb  lxwbGFpblxmMFxmczI0XGhpY2hcZjFcZGJjaFxmMVxsb2NoXGYxXGNmNFxmczIwIFNpZ25cY2YwXG5lc3RjZWxse1xub25lc3R0YWJsZXNccGFyfQ0KXGludGJsXGl0YXAyDQp7e1wqXG5lc3R0YWJsZXByb3BzXHRyb3dkXHRyZ2FwaDBcdHJsZWZ0ODBcdHJrZWVwXGx0cnJvdw0KXGNsdmVydGFsdFxjbGNicGF0MVxjZWxseDI4OA0KXGN  sdmVydGFsdFxjbGNicGF0MVxjZWxseDQ5Ng0KXGNsdmVydGFsdFxjbGNicGF0MVxjZWxseDI0NzYNClxjbHZlcnRhbHRcY2xjYnBhdDFcY2VsbHg1MDgyDQpcY2x2ZXJ0YWx0XGNsY2JwYXQxXGNlbGx4NzY4OA0KXGNsdmVydGFsdFxjbGNicGF0MVxjZWxseDEwNTAyDQpcbmVzdHJvd317XG5vbmVzdHRhYmxlc1xwYXJ46fQ0KXHBhcmRca  W50YmxcaXRhcDJcczBcbGktMjBcc2wyNFxxbFxrZWVwblxwbGFpblxmMFxmczI0XGhpY2hcZjFcZGJjaFxmMVxsb2NoXGYxXGZzMjBcbmVzdGNlbGx7XG5vbmVzdHRhYmxlc1xwYXJ9DQpccGFyZFxpbnRibFxpdGFwMlxzMFxsaTgwXHJpODBcc2wyNFxxbFxrZWVwblxwbGFpblxmMFxmczI0XGhpY2hcZjFcZGJjaFxmMVxsb2NoXGYxXGZ  zMjBcbmVzdGNlbGx7XG5vbmVzdHRhYmxlc1xwYXJ9DQpccGFyZFxpbnRibFxpdGFwMlxzMFxsaTgwXHJpODBcc2wyNFxxbFxrZWVwblxwbGFpblxmMFxmczI0XGhpY2hcZjFcZGJjaFxmMVxsb2NoXGYxXGNmNFxmczIwIDExLzE0LzE3IDExNDFcY2YwXG5lc3RjZWxse1xub25lc3R0YWJsZXNccGFyfQ0KXHBhcmRcaW50YmxcaXRhcDJcc  zBcbGk4MFxyaTgwXHNsMjRccWxca2VlcG5ccGxhaW5cZjBcZnMyNFxoaWNoXGYxXGRiY2hcZjFcbG9jaFxmMVxjZjRcZnMyMCBCcmF1biwgSmVubmlmZXIgTC4sIFJNQVxjZjBcbmVzdGNlbGx7XG5vbmVzdHRhYmxlc1xwYXJ9DQpccGFyZFxpbnRibFxpdGFwMlxzMFxsaTgwXHJpODBcc2wyNFxxbFxrZWVwblxwbGFpblxmMFxmczI0XGh  pY2hcZjFcZGJjaFxmMVxsb2NoXGYxXGNmN FxmczIwIE1lZGljYWwgQXNzaXN0YW50XGNmMFxuZXN0Y2VsbHtcbm9uZXN0dGFibGVzXHBhcn0NClxwYXJkXGludGJsXGl0YXAyXHMwXGxpODBccmk4MFxzbDI0XHFsXGtlZXBuXHBsYWluXGYwXGZzMjRcaGljaFxmMVxkYmNoXGYxXGxvY2hcZjFcY2Y0XGZzMjAgU2lnbiBhdCBjbG9zZSBlb  mNvdW50ZXJcY2YwXG5lc3RjZWxse1xub25lc3R0YWJsZXNccGFyfQ0KXGludGJsXGl0YXAyDQp7e1wqXG5lc3R0YWJsZXByb3BzXHRyb3dkXHRyZ2FwaDBcbGFzdHJvd1x0cmxlZnQ4MFx0cmtlZXBcbHRycm93DQpcY2x2ZXJ0YWx0XGNsY2JwYXQxXGNlbGx4Mjg4DQpcY2x2ZXJ0YWx0XGNsYnJkcnRcYnJkcnNcYnJkcncyMFxicmRyY2Y  xM1xjbGNicGF0MVxjZWxseDQ5Ng0KXGNsdmVydGFsdFxjbGJyZHJ0XGJyZHJzXGJyZHJ3MjBcYnJkcmNmMTNcY2xjYnBhdDFcY2VsbHgyNDc2DQpcY2x2ZXJ0YWx0XGNsYnJkcnRcYnJkcnNcYnJkcncyMFxicmRyY2YxM1xjbGNicGF0MVxjZWxseDUwODINClxjbHZlcnRhbHRcY2xicmRydFxicmRyc1xicmRydzIwXGJyZHJjZjEzXGNsY  2JwYXQxXGNlbGx4NzY4OA0KXGNsdmVydGFsdFxjbGJyZHJ0XGJyZHJzXGJyZHJ3MjBcYnJkcmNmMTNcY2xjYnBhdDFcY2VsbHgxMDUwMg0KXG5lc3Ryb3d9e1xub25lc3R0YWJsZXNccGFyfX0NClxwYXJkXGludGJsXGl0YXAyXHMwXHNsMTJccWxccGxhaW5cZjBcZnMyNFxoaWNoXGYxXGRiY2hcZjFcbG9jaFxmMVxmczEyXG5lc3RjZWx  se1xub25lc3R0YWJsZXNccGFyfQ0KXGludGJsXGl0YXAyDQp7e1wqXG5lc3R0YWJsZXByb3BzXHRyb3dkXHRyZ2FwaDBcbGFzdHJvd1x0cmxlZnQ4MFx0cmtlZXBcbHRycm93DQpcY2x2ZXJ0YWx0XGNsY2JwYXQxXGNlbGx4MTA1MDQNClxuZXN0cm25fXtcbm9uZXN0dGFibGVzXHBhcn19DQpcdHJvd2RcdHJnYXBoMFxsYXN0cm93XHRyb  GVmdDBcdHJrZWVwDQpcY2x2ZXJ0YWx0XGNlbGx4MjE2DQpcY2x2ZXJ0YWx0XGNlbGx4MTA4MDANClxwYXJkXGludGJsXHMwXGxpODBccmk4MFxzbDI0XHFsXGtlZXBuXHBsYWluXGYwXGZzMjRcaGljaFxmMVxkYmNoXGYxXGxvY2hcZjFcZnMyMFxjZWxsDQpcaW50Ymxccm93DQpcdHJvd2RcdHJnYXBoMFxsYXN0cm93XHRybGVmdDANClx  jbHZlcnRhbHRcY2VsbHgxMDgwMA0KXHBhcmRcaW50YmxcczBcc2wxMlxxbFxwbGFpblxmMFxmczI0XGhpY2hcZjFcZGJjaFxmMVxsb2NoXGYxXGZzMTJcY2VsbA0KXGludGJsXHJvdw0KXHBhcmRcczBccWxccGxhaW5cZjBcZnMyNFxoaWNoXGYxXGRiY2hcZjFcbG9jaFxmMVxmczEyXHBhcmRcc2VjdA0KXHNlY3RkXHBnd3N4bjEyMjQwX  HBnaHN4bjE1ODQwXG1hcmdsc3huODY0XG1hcmdyc3huNTc2XG1hcmd0c3huNzIwXG1hcmdic3huNzIwXGhlYWRlcnk3MjBcZm9vdGVyeTcyMFxzYmtub25lXHBnbmNvbnRccGduZGVjDQp7XGhlYWRlcg0KXHRyb3dkXHRyZ2FwaDBcbGFzdHJvd1x0cmxlZ nQwDQpcY2x2ZXJ0YWx0XGNsYnJkcmJcYnJkcnNcYnJkcncyMFxicmRyY2Y0XGN  lbGx4MzU2NA0KXGNsdmVydGFsdFxjbGJyZHJiXGJyZHJzXGJyZHJ3MjBcYnJkcmNmNFxjZWxseDYyNjQNClxjbHZlcnRhbHRcY2xicmRyYlxicmRyc1xicmRydzIwXGJyZHJjZjRcY2VsbHgxMDgwMA0KXHBhcmRcaW50YmxcczBcc2wyNFxxbFxwbGFpblxmMFxmczI0XGhpY2hcZjBcZGJjaFxmMFxsb2NoXGYwDQp7XHBpY3RcanBlZ2Jsa  XAwXHBpY3cxODBccGljaDY5XHBpY3dnb2FsMjcwMFxwaWNoZ29hbDEwMzVccGljc2NhbGV4MTAwXHBpY3NjYWxleTEwMFxzc3BpY2FsaWduMCBmZmQ4ZmZlMDAwMTA0YTQ2NDk0NjAwMDEwMTAxMDA2MDAwNjAwMDAwZmZkYjAwNDMwMDA4MDYwNjA3MDYwNTA4MDcwNzA3MDkwOTA4MG EwYzE0MG QwYzBiMGIwYzE5MTIxMzBmMTQxZDFhMWY  xZTFkMWExYzFjMjAyNDJlMjcyMDIyMmMyMzFjMWMyODM3MjkyYzMwMzEzNDM0MzQxZjI3MzkzZDM4MzIzYzJlMzMzNDMyZmZkYjAwNDMwMTA5MDkwOTBjMGIwYzE4MG QwZDE4MzIyMTFjMjEzMjMyMzIzMjMyMzIzMjMyMzIzMjMyDQozMjMyMzIzMjMyMzIzMjMyMzIzMjMyMzIzMjMyMzIzMjMyMzIzMjMyMzIzMjMyMzIzMjMyMzIzMjMyM  zIzMjMyMzIzMjMyMzIzMjMyMzJmZmMwMDAxMTA4MDA0NTAwYjQwMzAxMjIwMDAyMTEwMTAzMTEwMWZmYzQwMDFmMDAwMDAxMDUwMTAxMDEwMTAxMDEwMDAwMDAwMDAwMDAwMDAwMDEwMjAzMDQwNTA2MDcwODA5MG EwYmZmYzQwMGI1MTAwMDAyMDEwMzAzMDIwNDAzMDUwNTA0MDQwMDAwMDE3ZDAxMDIwMzAwMDQxMTA1MTIyMTMxNDEwNjE  zNTE2MTA3MjI3MTE0MzI4MTkxYTEwODIzNDJiMWMxMTU1MmQxZjAyNDMzNjI3MjgyMDkwYTE2MTcxODE5MWEyNTI2MjcyODI5MmEzNDM1MzYzNzM4DQozOTNhNDM0NDQ1NDY0NzQ4NDk0YTUzNTQ1NTU2NTc1ODU5NWE2MzY0NjU2NjY3Njg2OTZhNzM3NDc1NzY3Nzc4Nzk3YTgzODQ4NTg2ODc4ODg5OGE5MjkzOTQ5NTk2OTc5ODk5OWFhM  mEzYTRhNWE2YTdhOGE5YWFiMmIzYjRiNWI2YjdiOGI5YmFjMmMzYzRjNWM2YzdjOGM5Y2FkMmQzZDRkNWQ2ZDdkOGQ5ZGFlMWUyZTNlNGU1ZTZlN2U4ZTllYWYxZjJmM2Y0ZjVmNmY3ZjhmOWZhZmZjNDAwMWYwMTAwMDMwMTAxMDEwMTAxMDEwMTAxMDEwMDAwMDAwMDAwMDAwMTAyMDMwNDA1MDYwNzA4MDkwYTBiZmZjNDAwYjUxMTAwMDI  wMTAyMDQwNDAzMDQwNzA1MDQwNDAwMDEwMjc3MDAwMTAyMDMxMTA0DQowNTIxMzEwNjEyNDE1MTA3NjE3MTEzMjIzMjgxMDgxNDQyOTFhMWIxYzEwOTIzMzM1MmYwMTU2MjcyZDEwYTE2MjQzNGUxMjVmMTE3MTgxOTFhMjYyNzI4MjkyYTM1MzYzNzM4MzkzYTQzNDQ0NTQ2NDc0ODQ5NGE1MzU0NTU1NjU3NTg1OTVhNjM2NDY1NjY2NzY4N  jk2YTczNzQ3NTc2Nzc3ODc5N2E4MjgzODQ4NTg2ODc4ODg5OGE5MjkzOTQ5NTk2OTc5ODk5OWFhMmEzYTRhNWE2YTdhOGE 5YWFiMmIzYjRiNWI2YjdiOGI5YmFjMmMzYzRjNWM2YzdjOGM5Y2FkMmQzZDRkNWQ2ZDdkOGQ5ZGFlMmUzZTRlNWU2ZTdlOGU5ZWFmMmYzZjRmNWY2ZjdmOGY5ZmFmZmRhMDAwYzAzMDEwMDAyMTEwMzExDQowMDN  mMDBmNmRkNWI1ZmQyZjQzZjI3ZmI0YWVkNmRmY2VkZGU1ZWU1Mjc3NjMxOWU4MG ZhOGFjZGZmMDA4NGZiYzJmZjAwZjQxNzhiZmVmODdmZjBhZTRiZTMxN2ZjYzE3ZmVkYmZmZWQzYWYyZGFmNWYwYjk3ZDNhZDQ5NGU0ZGRkZmYwMDk5ZTJlMmYzMmFiNDZiM2E3MTRhY2JmYzhmN2ZmZjAwODRmYmMyZmYwMGY0MTc4YmZlZjg3ZmYwYWQwZ  DJiYzQ3YTRlYjcyYzkxZTlkN2E5NzBmMThkY2UxNTU4NjA3ZTIyYmU3MG FmNDdmODQxZmYwMDIxNWQ0YmZlYjgyZmZlODU0ZjEzOTc1M2E1NDljZDM3NzQ0ZTE3MzNhYjVhYjQ2OWM5MmIzZmViYjllYjk0NTE0NTc4ZTdiODE0NTE0NTAwMTQ1MTQ1DQowMDE0NTE0N

## 2015-12-16 NOTE — Unmapped (Signed)
Subjective:  Patient ID: Wanda Rowe  Age: 55 y.o. (DOB: 01-10-1961)  Ethnicity: Non-Hispanic  Race: White or Caucasian  Gender: female    Chief Complaint:  Chief Complaint  Patient presents with  ?????? Pre-op Exam    LT TKA scheduled 01/02/16          HPI:She is here to meet with me and discussed her upcoming left total knee  replacement for January 02, 2016.  She notes that she did fall down the steps  in early June.  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  She  was on a medication that they wonder if that could've caused these residual  blood clots.  She is wondering if her surgery will have to be canceled.      Ortho Exam: Left knee has mild warmth and swelling, no erythema or effusion.  Range of motion is decreased and painful at extremes.  She ambulates with a  limp.  Alert and oriented, neurologically intact.      ROS:    Review of Systems  Musculoskeletal: Joint Pain, Knee Pain  Constitutional: (-) Fever, (-) Chills  Cardiovascular: (-) Leg Swelling      Patient Vitals:  Vitals  Height: 5' 8 (172.7 cm)  Weight: 217 lb (98.4 kg)      X-Ray/Injection In Clinic Procedure Notes        In Clinic Administered Meds Billing Data  None        Assessment/Impression:    Encounter Diagnoses  Code Name Primary?  ?????? M17.12 Primary osteoarthritis of left knee Yes        Plan:  No orders of the defined types were placed in this encounter.    I reviewed her Doppler studies and advised that we are going to have to cancel  her surgery.  There is progression of the thrombosis.  In August it was a small  clot.  Thyra Breed past the ones that were there have progressed instead of  dissipating.  I advised that she return to Dr. Selena Batten.  In the future if she gets  cleared for surgery we may consider IVC filter placement.  All questions  answered and understood.  She will call once she has cleared to have surgery  again.    I, [name], RMA am scribing for, and in the presence of Dr. George Ina,  including xray  interpretation if performed.    I, George Ina, MD personally performed the services described in the  documentation as scribed by [name], RMA in my presence, and confirm it  is both accurate and complete.    Please note that documentation was completed using dragon speak and some errors  may have occurred and have not been identified.  Please check the chart for  addendums.  If there are major errors that you have concern about please contact  the office for clarification.

## 2015-12-25 ENCOUNTER — Encounter: Payer: PRIVATE HEALTH INSURANCE | Attending: Radiation Oncology

## 2016-01-01 ENCOUNTER — Ambulatory Visit: Admit: 2016-01-01 | Discharge: 2016-01-01 | Payer: PRIVATE HEALTH INSURANCE | Attending: Radiation Oncology

## 2016-01-01 DIAGNOSIS — C50111 Malignant neoplasm of central portion of right female breast: Secondary | ICD-10-CM

## 2016-01-01 NOTE — Progress Notes (Signed)
Chief Complaint   Patient presents with    Follow-up    Breast Cancer       Diagnosis  Breast cancer [facility])    Staging form: Breast, AJCC 7th Edition    - Clinical: Stage IA (T1b, N0, cM0) - Signed by Andree Coss, MD on 02/27/2013    - Pathologic: Stage Unknown (TX, N0(i-), Free text: Stage) - Signed by Andree Coss, MD on 02/27/2013    Previous Treatments  12/26/2012 - Neoadjuvant TCHP   02/09/2013 - Bilateral breast reduction with right breast partial mastectomy and sentinal lymph node biopsy   04/18/2013 - Adjuvant right breast whole breast radiotherapy 42.56Gy in 16Fx without boost due to lack of seroma target     History of Present Illness  Sangeeta is feeling well. She continues to improve and stay active. 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 She denies focal issues of the breast including pain, swelling, adenopathy, skin changes, or asymmetry.        Review of Systems  Constitutional: Positive for weight gain. Negative for fever, weight loss, appetite change and fatigue.        Some minor breast soreness   HENT: Negative for congestion, sore throat and trouble swallowing.    Eyes: Negative for visual disturbance.   Respiratory: Negative for cough and shortness of breath.    Cardiovascular: Negative for chest pain.   Gastrointestinal: Negative for nausea, vomiting, abdominal pain, diarrhea, constipation and blood in stool.   Genitourinary: Negative for dysuria, frequency, hematuria, vaginal bleeding, vaginal discharge, difficulty urinating, menstrual problem, pelvic pain and dyspareunia.   Musculoskeletal: Negative for back pain and arthralgias.   Skin: Negative for rash.   Neurological: Negative for seizures, weakness, numbness and headaches.   Hematological: Negative for adenopathy.   Psychiatric/Behavioral: Negative for depression.   All other systems reviewed and are negative.    Allergies  Succinylcholine; Zithromax [azithromycin]; Silver;  Sulfa (sulfonamide antibiotics); and Sulfanilamide    Medications  Outpatient Encounter Prescriptions as of 01/01/2016   Medication Sig Dispense Refill    albuterol (PROVENTIL HFA;VENTOLIN HFA) 90 mcg/actuation inhaler Inhale 1-2 puffs into the lungs every 6 hours as needed. Q4-6H PRN 1 Inhaler 3    ALPRAZolam (XANAX) 0.25 MG tablet Take 1 tablet (0.25 mg total) by mouth 3 times a day as needed for Sleep. 30 tablet 0    bisoprolol-hydrochlorothiazide (ZIAC) 2.5-6.25 mg per tablet Take 1 tablet by mouth daily. 30 tablet 0    blood sugar diagnostic (ACCU-CHEK AVIVA PLUS TEST STRP) Strp fsbs qd 100 strip 1    blood-glucose meter (GLUCOSE MONITORING KIT) kit by Other route 2 (two) times daily. Use as instructed - fsbs bid       CALCIUM CITRATE ORAL Take 1 tablet by mouth daily.        cholecalciferol, vitamin D3, 2,000 unit Cap Take 1 capsule by mouth daily. 1 capsule 0    conjugated estrogens (PREMARIN) vaginal cream twicwe a week 42.5 g 0    desvenlafaxine succinate (PRISTIQ) 25 mg Tb24 Take 1 tablet (25 mg total) by mouth daily. 30 tablet 3    enoxaparin (LOVENOX) 100 mg/mL Syrg       fluticasone (FLONASE) 50 mcg/actuation nasal spray Use 2 sprays into each nostril daily. 16 g 5    folic acid-vit B6-vit B12 2.5-25-1 mg Tab Take 1 tablet by mouth daily. 90 each 3    gabapentin (NEURONTIN) 600 MG tablet Take  1 tablet (600 mg total) by mouth 3 times a day. 270 tablet 1    ipratropium (ATROVENT) 0.03 % nasal spray Use 2 sprays into each nostril 2 times a day. 30 mL 5    LACTOBACILLUS ACIDOPHILUS (PROBIOTIC ORAL) Take 1 capsule by mouth daily.      levothyroxine (SYNTHROID) 75 MCG tablet Take 1 tablet (75 mcg total) by mouth daily. 90 tablet 0    valACYclovir (VALTREX) 500 MG tablet TAKE 1 TABLET BY MOUTH EVERY DAY 30 tablet 0     No facility-administered encounter medications on file as of 01/01/2016.         Histories  She has a past medical history of Anxiety; Asthma; Cancer (HCC); Diabetes mellitus  (HCC); GERD (gastroesophageal reflux disease); Hyperlipidemia; Hypertension; Hypothyroidism; Polyp of stomach; and RSD (reflex sympathetic dystrophy).    She has a past surgical history that includes Anterior cruciate ligament repair; Meniscectomy; Dental surgery; Hysterectomy; Foot surgery; gastric sleeve; Breast surgery (Right); and Esophagogastroduodenoscopy (N/A, 07/19/2015).    Her family history includes Breast cancer in her paternal grandmother; Cancer in her maternal grandmother; Colon cancer in her maternal grandfather; Diabetes in her father and maternal grandmother; Factor VIII deficiency in her father; Hypertension in her father and mother; Other in her brother and mother; Stroke in her maternal grandmother.    She reports that she has never smoked. She has never used smokeless tobacco. She reports that she drinks alcohol.        Blood pressure 106/41, pulse 69, temperature 97 F (36.1 C), resp. rate 16.  Physical Exam    Constitutional: She is oriented to person, place, and time. She appears well-developed and well-nourished.   HENT:   Head: Normocephalic.   Mouth/Throat: No oropharyngeal exudate.   Eyes: Conjunctivae are normal. No scleral icterus.   Cardiovascular: Normal rate.    Pulmonary/Chest: Effort normal and breath sounds normal. She has no wheezes. She has no rales.   Some mild fibrosis and minimal enlargement of R breast as compared to L. No palpable masses or skin changes present bilaterally. Negative axillary nodes bilaterally   Abdominal: Soft.   Musculoskeletal: Normal range of motion. She exhibits no tenderness.   Lymphadenopathy:     She has no cervical adenopathy.   Neurological: She is alert and oriented to person, place, and time.   Skin: Skin is warm and dry.   Psychiatric: She has a normal mood and affect. Her behavior is normal.        Diagnostic Studies Reviewed      Review of Lab Results  Lab Results   Component Value Date    WBC 4.8 10/29/2015    RBC 3.95 10/29/2015    HGB  12.5 10/29/2015    HCT 36.2 10/29/2015    MCV 91.6 10/29/2015    MCH 31.6 10/29/2015    MCHC 34.5 10/29/2015    RDW 11.9 10/29/2015    PLT 184 10/29/2015    MPV 10.5 10/29/2015          Assessment  Ms Caldas is doing well. She is without evidence of disease. She has an excellent cosmetic outcome. She has issues with weight due arthropathy and now a DVT.                      Plan  She will follow-up with me as needed.

## 2016-01-06 DIAGNOSIS — Z7901 Long term (current) use of anticoagulants: Secondary | ICD-10-CM | POA: Insufficient documentation

## 2016-01-06 DIAGNOSIS — R040 Epistaxis: Secondary | ICD-10-CM | POA: Insufficient documentation

## 2016-01-23 MED ORDER — bisoprolol-hydrochlorothiazide (ZIAC) 2.5-6.25 mg per tablet
2.5-6.25 | ORAL_TABLET | Freq: Every day | ORAL | 0 refills | 75.00000 days | Status: AC
Start: 2016-01-23 — End: 2016-03-25

## 2016-02-03 HISTORY — PX: REPLACEMENT TOTAL KNEE: SUR1224

## 2016-02-12 NOTE — Unmapped (Signed)
Follow Up Form  Chief Complaint  Patient presents with  ?????? Breast Cancer Hx    Rt Invasive ductal carcinoma  ?????? Imaging Results    Breast MRI done 01/23/16      Mrs. Guin is a 56 year old woman, who is here for her 6 month follow up visit  for right breast cancer. She stopped taking Evista in August after being  diagnosed with a DVT. She had to cancel her knee replacement and is on Lovenox.  All labs and clotting factors are normal. Her clots probably secondary to  trauma/stasis.    She reports having atrophic vaginitis and bleeding, especially bad with sex, but  now has broken up with her boyfriend. She did have Premarin cream for her  vaginal dryness, but never started it. She was worried with her history of  cancer and then forgot about it.    She sees Dr. Selena Batten at the end of the month. Her repeat doppler is pending.        She did undergo imaging, recently. This included breast MRI. Results are as  follows:    History invasive ductal carcinoma right breast    Dedicated breast coil utilized.    Images performed pre and postcontrast administration.    9 mL gadolinium injected.    MRI CAD analysis utilized during interpretation.    Comparison made to prior study 01/21/2015.    Animal background parenchymal enhancement noted both breast    Right breast no suspicious mass or suspicious area of contrast  enhancement seen within the right breast. There are some scattered foci  contrast enhancement nonspecific favors fibrocystic changes.    Patient again status post post right lumpectomy.    Left breast    No suspicious mass or suspicious area of contrast enhancement seen left  breast. There are some scattered foci of contrast enhancement left  breast again nonspecific favor fibrocystic changes.    No suspicious lymphadenopathy identified      IMPRESSION:  1. No suspicious mass or suspicious contrast enhancement either breast.  2. Status post right lumpectomy.  3. ACR category 2    Signed By: Malva Cogan MD,  Idamae Schuller:    BREAST CANCER HX  Tumor Size: Right IDC, stage I  T: T1b  N: N0  M: cM0  Estrogen: negative  Progesterone: negative  Her 2 neu: positive by IHC  Oncotype Score: None  Chemotherapy: neoadjuvantTCHP (completed 12/26/12)  Radiation Therapy: Right WBR without Boost (42.56Gy in 16 Fx completed 04/19/14)  Endocrine Therapy: Evista  Breast Surgery Type/Date: right wire loc lumpectomy w/SNB  DX Date: 08/29/12    OB-GYN  Age of Menarche: 50  G: 1  P: 1  Age of 1st Delivery: 39  Menopause Age: 89 (Partial hysterectomy)  BCP Use: yes, 5 years  Fertility Treatment: no  Hormone Replacement: no    IMAGING/BIOPSY HX  Age of 1st mammogram: 67  Date of last mammogram: 07/24/15  Frequency : annually  [facility] : [facility]  Past Biopsies: yes, 2014, IDC    BREAST CA RISK ASSESSMENT  Family HX Breast CA: No  Family HX Ovarian CA: No  Family HX Other CAs: Yes (MGM bladder 60's, MGF colon 60's)  BRCA Testing?: No    Imaging performed: Breast MRI    Medical/Surgical/Family/Social history updates since previous visit: Unchanged    Review of Systems  Constitutional: Negative.  Negative for chills, diaphoresis, fever,  malaise/fatigue and weight loss.  Respiratory: Negative for  cough and shortness of breath.  Cardiovascular: Negative for chest pain and leg swelling.  Gastrointestinal: Negative for abdominal pain, nausea and vomiting.  Genitourinary:       Atrophic vaginitis  Musculoskeletal: Negative for back pain, joint pain and neck pain.  Skin: Negative for itching and rash.  Neurological: Negative for weakness and headaches.  Psychiatric/Behavioral: Negative for depression and suicidal ideas. The patient  is not nervous/anxious.  All other systems reviewed and are negative.      Physical Exam  Constitutional: She is oriented to person, place, and time. She appears  well-developed and well-nourished.  HENT:  Head: Normocephalic and atraumatic.  Eyes: EOM are normal.  Neck: Normal range of motion. Neck supple.  No JVD present. No tracheal deviation  present. No thyromegaly present.  Cardiovascular: Normal rate.  Pulmonary/Chest: Effort normal. No stridor. No respiratory distress. She  exhibits no tenderness. Right breast exhibits skin change. Right breast exhibits  no inverted nipple, no mass, no nipple discharge and no tenderness. Left breast  exhibits skin change. Left breast exhibits no inverted nipple, no mass, no  nipple discharge and no tenderness. Breasts are symmetrical.      Bilateral breasts with everted nipples. All incisions well healed, bilaterally.  No masses and no other skin changes.      Abdominal: Soft. She exhibits no distension and no mass. There is no tenderness.  There is no rebound and no guarding.  Musculoskeletal: Normal range of motion. She exhibits no edema or tenderness.  Lymphadenopathy:       Head (right side): No submandibular adenopathy present.       Head (left side): No submandibular adenopathy present.    She has no cervical adenopathy.       Right cervical: No superficial cervical and no deep cervical adenopathy  present.       Left cervical: No superficial cervical and no deep cervical adenopathy  present.    She has no axillary adenopathy.       Right axillary: No pectoral and no lateral adenopathy present.       Left axillary: No pectoral and no lateral adenopathy present.       Right: No supraclavicular adenopathy present.       Left: No supraclavicular adenopathy present.  Neurological: She is alert and oriented to person, place, and time. No cranial  nerve deficit. Coordination normal.  Skin: Skin is warm and dry. No rash noted. No erythema. No pallor.  Psychiatric: She has a normal mood and affect. Her behavior is normal. Judgment  and thought content normal.  Nursing note and vitals reviewed.    Vitals:   02/12/16 1149  BP: 111/67  Pulse: 58  Temp: 98 ??????F (36.7 ??????C)  TempSrc: Oral  Weight: 213 lb (96.6 kg)  Height: 5' 8 (1.727 m)      Assessment: 56 year old woman 3.5 years  out from dx of right IDC, treated with  lumpectomy, slnb, chemotherapy and radiation. MRI benign. Stopped Evista due to  recent DVT. On Lovenox. Discussed Premarin cream.    Plan: Start Premarin for atrophic vaginitis. Continue monthly self exam.    Data reviewed: x-ray, review old MR and/or Hx other source and/or discussion of  case with another provider and Drs visual test/study    Patient's Medications  New Prescriptions   ESTROGENS, CONJUGATED, (PREMARIN) 0.625 MG TABLET    Take 1 Tab by mouth daily.      Order Dose: 0.625 mg  Current Medications  ALBUTEROL (PROVENTIL HFA) 90 MCG/ACTUATION HFA AEROSOL INHALER    Take 2 Puffs  by inhalation every 6 hours as needed.      Order Dose: 2 Puffs   BISOPROLOL-HYDROCHLOROTHIAZIDE (ZIAC) 2.5-6.25 MG PER TABLET    Take 1 Tab by  mouth daily. 1/2 tab daily      Order Dose: 1 Tab   DULOXETINE (CYMBALTA) 60 MG CAPSULE, DELAYED RELEASE(E.C.)    Take 30 mg by  mouth daily. Reported on 07/24/2015      Order Dose: 30 mg   ENOXAPARIN (LOVENOX) 100 MG/ML INJECTION    1 mg/kg by Subcutaneous route every  12 hours. Twice daily      Order Dose: 1 mg/kg   ERGOCALCIFEROL (ERGOCALCIFEROL) 50,000 UNIT CAPSULE    Take 2,000 Units by  mouth daily. Last dose 01/30/13      Order Dose: 2,000 Units   FLUTICASONE (FLONASE) 50 MCG/ACTUATION NA NASAL SPRAY    Spray 2 Sprays into  nose daily. Each nostril      Order Dose: 2 Sprays   GABAPENTIN (NEURONTIN) 600 MG TABLET    Take 600 mg by mouth 3 times daily.      Order Dose: 600 mg   IPRATROPIUM (ATROVENT) 0.03 % NA NASAL SPRAY    Spray 2 Sprays into nose 2  times daily. Each nostril      Order Dose: 2 Sprays   LACTOBACILLUS RHAMNOSUS (CULTURELLE) 10 BILLION CELL    Take 1 Cap by mouth  daily.      Order Dose: 1 Cap   LEVOTHYROXINE (SYNTHROID) 75 MCG PO TABLET    Take 75 mcg by mouth daily.      Order Dose: 75 mcg      Follow up: 6 months    Imaging needed: Bilateral dx tomosynthesis    I spent 20 minutes face to face with the patient, greater than  75% of which was  dedicated toward counseling.    Amie Portland, CMA , acting as a scribe for Dr. Ammie Ferrier    The above note has been reviewed and reflects the work and decisions made by the  physician.  Victorino Dike B. Vanice Sarah, MD, FACS    02/20/2016    [facility]  [address] [place] [address]  [place], [place] [address]  [phone] Phone  [phone] Fax

## 2016-02-17 NOTE — Unmapped (Signed)
Subjective:    Patient ID: Wanda Rowe  Age: 56 y.o. (DOB: November 16, 1960)  Ethnicity: Non-Hispanic  Race: White or Caucasian  Gender: female    Chief Complaint:  Chief Complaint  Patient presents with  ?????? Annual Wellness Visit (Subsequent)    Pap in July of 2017 was Positive for HPV, Has Paps every 6 months Annual Exam.  Mri of Breast was in June      HPI annual s/o brerast cancer. Recent dvt in left calf. tx with xaralto with no  effect now on lovenox. Suppose to have left knee surgery but cancelled. Off all  evist,ert and vaginal creams. No sex currently    Past Medical History:  Diagnosis Date  ?????? Anemia   in past  ?????? Arthritis   knees  ?????? Asthma   in past  ?????? Back problem   occ back ache  ?????? Cancer (CMS HCC)   right breast- chemo. only in 2014 Lumpectomy  ?????? Chest pain   when under stress-testing done & no cause of chest pain was found  ?????? Complication of anesthesia   rxn to succinylcholine-day after surgery, pt  had difficulty w/ muscle  movement-dr j collins in anesthesia dept notified  ?????? Diabetes mellitus type II   resolved due to weight loss/ no meds  ?????? Dysplasia  ?????? GERD (gastroesophageal reflux disease)   no meds  ?????? Hernia   hiatal-resolved with wt loss surg  ?????? Hypertension   in past & no longer taking medication  ?????? Irregular heart beat   MVP & heart murmur-used to take pre-dental antibiotics  ?????? Liver disorder   pt told she has a fatty liver-resolved per pt  ?????? Osteopenia  ?????? RSD (reflex sympathetic dystrophy)  ?????? Sleep apnea   poss, but not dx'd  ?????? Thyroid disorder   hypothyroidism    OB History  Gravida Para Term Preterm AB Living  1 1       1   SAB TAB Ectopic Multiple Live Births      # Outcome Date GA Lbr Len/2nd Weight Sex Delivery Anes PTL Lv  1 Para      Past Surgical History:  Procedure Laterality Date  ?????? HX BREAST BIOPSY  july 2014   right  ?????? HX BREAST LUMPECTOMY  02-2013   right; also bilat reduction; back to surg next day for post op bleeding  ?????? HX COLPOSCOPY  ??????  HX FOOT SURGERY   right  ?????? HX GUM SURGERY   done for receding gums  ?????? HX HYSTERECTOMY, TOTAL ABDOMINAL  ?????? HX KNEE SURGERY   left x 3, right x 1  ?????? HX LAPAROSCOPY  ?????? HX OTHER SURGICAL HISTORY  jan 2014   gastric sleeve  ?????? HX OTHER SURGICAL HISTORY   ins. portacath  ?????? HX UPPER GI ENDOSCOPY   mult in past due to stomach polyps  ?????? HX VAGINAL DELIVERY  ?????? REMOVAL OF OVARY/TUBE(S)    Social History  Substance Use Topics  ?????? Smoking status: Never Smoker  ?????? Smokeless tobacco: Never Used  ?????? Alcohol use Yes     Comment: very occasionally    History  Sexual Activity  ?????? Sexual activity: Yes  ?????? Partners: Male    Social History    Social History Narrative  ?????? No narrative on file    Family History  Problem Relation Age of Onset  ?????? Heart Problems Paternal Grandfather    mi  ?????? Anesthesia Complications Neg Hx  ?????? Cancer  unspecified grandmother  ?????? Diabetes Father  ?????? Diabetes    unspecified grandmother  ?????? High Blood Pressure Mother  ?????? High Blood Pressure Father  ?????? Stroke    unspecified grandmother  ?????? Colon Cancer Maternal Grandfather 60  ?????? Cancer Maternal Grandmother 60    Bladder      Medications:  Current Outpatient Prescriptions  Medication Sig Dispense Refill  ?????? albuterol (PROVENTIL HFA) 90 mcg/actuation HFA Aerosol Inhaler Take 2 Puffs by  inhalation every 6 hours as needed.  ?????? bisoprolol-hydrochlorothiazide (ZIAC) 2.5-6.25 mg per tablet Take 1 Tab by  mouth daily. 1/2 tab daily  ?????? DULoxetine (CYMBALTA) 60 mg Capsule, Delayed Release(E.C.) Take 30 mg by mouth  daily. Reported on 07/24/2015  ?????? enoxaparin (LOVENOX) 100 mg/mL injection 1 mg/kg by Subcutaneous route every  12 hours. Twice daily  ?????? ergocalciferol (ERGOCALCIFEROL) 50,000 unit Capsule Take 2,000 Units by mouth  daily. Last dose 01/30/13  ?????? estrogens, conjugated, (PREMARIN) 0.625 mg Tablet Take 1 Tab by mouth daily.  30 Tab 0  ?????? fluticasone (FLONASE) 50 mcg/Actuation NA nasal spray Spray 2 Sprays into  nose  daily. Each nostril  ?????? gabapentin (NEURONTIN) 600 mg tablet Take 600 mg by mouth 3 times daily.  ?????? ipratropium (ATROVENT) 0.03 % NA nasal spray Spray 2 Sprays into nose 2 times  daily. Each nostril  ?????? lactobacillus rhamnosus (CULTURELLE) 10 billion cell Take 1 Cap by mouth  daily.  ?????? levothyroxine (SYNTHROID) 75 mcg PO tablet Take 75 mcg by mouth daily.    No current facility-administered medications for this visit.      Allergies:  Allergies  Allergen Reactions  ?????? Azithromycin    **DOES NOT WORK**  ?????? Silver Rash  ?????? Succinylcholine Other (See Comments)    Difficulty w/ muscle movement the day after surgery  ?????? Sulfa (Sulfonamide Antibiotics) Swelling and Rash  ?????? Tegaderm Rash      Review of Systems  Musculoskeletal: Positive for joint pain.  All other systems reviewed and are negative.      General Negative  Skin Negative  Eyes Negative for visions concerns  HENT Negative  Endo Negative  Respiratory Negative for shortness of breath  Cardiovascular Negative for chest pain, palpitations  Neurological Negative for migraines or neurological issues  GI Negative for pain, persistent bloating or ongoing change in bowel habits and  bloody stool  GU Negative for incontinence, dysuria, hematuria  Musculoskeletal Negative  Psychological Negative for depression/anxiety      Objective:  BP 133/90 (BP Site: Left arm, BP Position: Sitting, BP Cuff Size: Large adult  long)    Pulse (!) 101    Ht 5' 8 (1.727 m)    Wt 209 lb (94.8 kg)    LMP  04/12/2008    Breastfeeding? No    BMI 31.78 kg/m??????  Physical Exam  Constitutional: She is oriented to person, place, and time. She appears  well-developed and well-nourished.  HENT:  Head: Normocephalic and atraumatic.  Neck: Normal range of motion. Neck supple. No tracheal deviation present. No  thyromegaly present.  Cardiovascular: Normal rate, regular rhythm, normal heart sounds and intact  distal pulses.  Pulmonary/Chest: Breath sounds normal.  Abdominal: Bowel  sounds are normal. There is no tenderness. Hernia confirmed  negative in the right inguinal area and confirmed negative in the left inguinal  area.  Genitourinary: Rectum normal, vagina normal and uterus normal. No breast  swelling, tenderness, discharge or bleeding. There is no rash, tenderness,  lesion or injury on  the right labia. There is no rash, tenderness, lesion or  injury on the left labia. Uterus is not deviated, not enlarged, not fixed and  not tender. Cervix exhibits no motion tenderness, no discharge and no  friability. Right adnexum displays no mass, no tenderness and no fullness. Left  adnexum displays no mass, no tenderness and no fullness. No erythema, tenderness  or bleeding in the vagina. No vaginal discharge found.  Genitourinary Comments: No masses h/o hpv  Musculoskeletal: Normal range of motion.  No swelling or pain in legs  Lymphadenopathy:    She has no cervical adenopathy.       Right: No inguinal adenopathy present.       Left: No inguinal adenopathy present.  Neurological: She is alert and oriented to person, place, and time. She has  normal reflexes.  Skin: Skin is warm and dry.  Psychiatric: She has a normal mood and affect. Her behavior is normal. Judgment  and thought content normal.        Assessment:      ICD-9-CM ICD-10-CM  1. Well female exam with routine gynecological exam V72.31 Z01.419  2. Abnormal Pap smear of vagina 795.10 R87.629 PAP WITH HPV DNA  3. Postmenopausal atrophic vaginitis 627.3 N95.2  4. Malignant neoplasm of right female breast, unspecified estrogen receptor  status, unspecified site of breast (CMS HCC) 174.9 C50.911  5. HSV (herpes simplex virus) anogenital infection 054.9 A60.9      Plan:    Orders Placed This Encounter  Procedures  ?????? Thin Prep Pap Cotest HPV - Over 30    Encounter Meds  Counseled patient about breast self exam, mammography screening, PAP, sexual  function, menopause, pelvic pain, diet/nutrition, physical activity/exercise,  work  satisfaction and call with pap dr Vanice Sarah deals with breast and mri's and  dr cody deals with dvt. w/u was negative for dycraisias.  Spent 30 minutes in face to face counseling which was >50% of the total visit  time of 40 minutes      Follow-up and Disposition   Return in about 6 months (around 08/16/2016).

## 2016-02-24 MED ORDER — levothyroxine (SYNTHROID, LEVOTHROID) 75 MCG tablet
75 | ORAL_TABLET | ORAL | 0 refills | Status: AC
Start: 2016-02-24 — End: 2016-05-24

## 2016-03-03 NOTE — Telephone Encounter (Signed)
Please add check up lab orders, would be thyroid check up

## 2016-03-25 ENCOUNTER — Ambulatory Visit: Admit: 2016-03-25 | Discharge: 2016-03-25 | Payer: PRIVATE HEALTH INSURANCE | Attending: Family

## 2016-03-25 DIAGNOSIS — J0141 Acute recurrent pansinusitis: Secondary | ICD-10-CM

## 2016-03-25 MED ORDER — bisoprolol-hydrochlorothiazide (ZIAC) 2.5-6.25 mg per tablet
2.5-6.25 | ORAL_TABLET | Freq: Every day | ORAL | 0 refills | 75.00000 days | Status: AC
Start: 2016-03-25 — End: 2016-04-22

## 2016-03-25 MED ORDER — methylPREDNISolone (MEDROL, PAK,) 4 mg tablet
4 | PACK | ORAL | 0 refills | Status: AC
Start: 2016-03-25 — End: 2016-04-22

## 2016-03-25 MED ORDER — albuterol (PROVENTIL;VENTOLIN;PROAIR) 90 mcg/actuation inhaler
90 | Freq: Four times a day (QID) | RESPIRATORY_TRACT | 3 refills | Status: AC | PRN
Start: 2016-03-25 — End: 2017-07-15

## 2016-03-25 MED ORDER — AMOXicillin-clavulanate (AUGMENTIN) 875-125 mg per tablet
875-125 | ORAL_TABLET | Freq: Two times a day (BID) | ORAL | 0 refills | Status: AC
Start: 2016-03-25 — End: 2016-04-22

## 2016-03-25 NOTE — Telephone Encounter (Signed)
Pt scheduled

## 2016-03-25 NOTE — Unmapped (Signed)
Subjective:    Patient ID: Wanda Rowe  Age: 56 y.o. (DOB: 10-Jun-1960)  Ethnicity: Non-Hispanic  Race: White or Caucasian  Gender: female    Chief Complaint:  Chief Complaint  Patient presents with  ?????? Pelvic Pain    Treated for Uti by urgent care finished antibiotics 2-10, was having some  pelvic pressure, discomfort, thought it was urinary. Still c/o discomfort and  vaginal odor      HPI    Location:  Bladder and vagina  Signs/Symptoms: odor, vaginal discharge and urinary sx  Severity:  3  Quality:  persistent  Duration:  2 weeks  Prior Treatment: atb by pcp    Past Medical History:  Diagnosis Date  ?????? Anemia   in past  ?????? Arthritis   knees  ?????? Asthma   in past  ?????? Back problem   occ back ache  ?????? Cancer (CMS HCC)   right breast- chemo. only in 2014 Lumpectomy  ?????? Chest pain   when under stress-testing done & no cause of chest pain was found  ?????? Complication of anesthesia   rxn to succinylcholine-day after surgery, pt  had difficulty w/ muscle  movement-dr j collins in anesthesia dept notified  ?????? Diabetes mellitus type II   resolved due to weight loss/ no meds  ?????? Dysplasia  ?????? GERD (gastroesophageal reflux disease)   no meds  ?????? Hernia   hiatal-resolved with wt loss surg  ?????? Hypertension   in past & no longer taking medication  ?????? Irregular heart beat   MVP & heart murmur-used to take pre-dental antibiotics  ?????? Liver disorder   pt told she has a fatty liver-resolved per pt  ?????? Osteopenia  ?????? RSD (reflex sympathetic dystrophy)  ?????? Sleep apnea   poss, but not dx'd  ?????? Thyroid disorder   hypothyroidism    OB History  Gravida Para Term Preterm AB Living  1 1       1   SAB TAB Ectopic Multiple Live Births      # Outcome Date GA Lbr Len/2nd Weight Sex Delivery Anes PTL Lv  1 Para      Past Surgical History:  Procedure Laterality Date  ?????? HX BREAST BIOPSY  july 2014   right  ?????? HX BREAST LUMPECTOMY  02-2013   right; also bilat reduction; back to surg next day for post op bleeding  ?????? HX  COLPOSCOPY  ?????? HX FOOT SURGERY   right  ?????? HX GUM SURGERY   done for receding gums  ?????? HX HYSTERECTOMY, TOTAL ABDOMINAL  ?????? HX KNEE SURGERY   left x 3, right x 1  ?????? HX LAPAROSCOPY  ?????? HX OTHER SURGICAL HISTORY  jan 2014   gastric sleeve  ?????? HX OTHER SURGICAL HISTORY   ins. portacath  ?????? HX UPPER GI ENDOSCOPY   mult in past due to stomach polyps  ?????? HX VAGINAL DELIVERY  ?????? REMOVAL OF OVARY/TUBE(S)    Social History  Substance Use Topics  ?????? Smoking status: Never Smoker  ?????? Smokeless tobacco: Never Used  ?????? Alcohol use Yes     Comment: very occasionally    History  Sexual Activity  ?????? Sexual activity: Yes  ?????? Partners: Male    Social History    Social History Narrative  ?????? No narrative on file    Family History  Problem Relation Age of Onset  ?????? Heart Problems Paternal Grandfather    mi  ?????? Anesthesia Complications Neg Hx  ?????? Cancer  unspecified grandmother  ?????? Diabetes Father  ?????? Diabetes    unspecified grandmother  ?????? High Blood Pressure Mother  ?????? High Blood Pressure Father  ?????? Stroke    unspecified grandmother  ?????? Colon Cancer Maternal Grandfather 60  ?????? Cancer Maternal Grandmother 60    Bladder      Allergy:  Allergies  Allergen Reactions  ?????? Azithromycin    **DOES NOT WORK**  ?????? Silver Rash  ?????? Succinylcholine Other (See Comments)    Difficulty w/ muscle movement the day after surgery  ?????? Sulfa (Sulfonamide Antibiotics) Swelling and Rash  ?????? Tegaderm Rash      Medications:  Current Outpatient Prescriptions  Medication Sig Dispense Refill  ?????? albuterol (PROVENTIL HFA) 90 mcg/actuation HFA Aerosol Inhaler Take 2 Puffs by  inhalation every 6 hours as needed.  ?????? bisoprolol-hydrochlorothiazide (ZIAC) 2.5-6.25 mg per tablet Take 1 Tab by  mouth daily. 1/2 tab daily  ?????? DULoxetine (CYMBALTA) 60 mg Capsule, Delayed Release(E.C.) Take 30 mg by mouth  daily. Reported on 07/24/2015  ?????? enoxaparin (LOVENOX) 100 mg/mL injection 1 mg/kg by Subcutaneous route every  12 hours. Twice  daily  ?????? ergocalciferol (ERGOCALCIFEROL) 50,000 unit Capsule Take 2,000 Units by mouth  daily. Last dose 01/30/13  ?????? estrogens, conjugated, (PREMARIN) 0.625 mg/gram Cream Insert into vagina twice  a week. 42.5 g 1  ?????? fluticasone (FLONASE) 50 mcg/Actuation NA nasal spray Spray 2 Sprays into nose  daily. Each nostril  ?????? gabapentin (NEURONTIN) 600 mg tablet Take 600 mg by mouth 3 times daily.  ?????? ipratropium (ATROVENT) 0.03 % NA nasal spray Spray 2 Sprays into nose 2 times  daily. Each nostril  ?????? lactobacillus rhamnosus (CULTURELLE) 10 billion cell Take 1 Cap by mouth  daily.  ?????? levothyroxine (SYNTHROID) 75 mcg PO tablet Take 75 mcg by mouth daily.  ?????? metroNIDAZOLE (FLAGYL) 500 mg tablet Take 1 Tab by mouth 3 times daily for 7  days. 21 Tab 0    No current facility-administered medications for this visit.      ROS    Objective:  BP 136/82 (BP Cuff Size: Large adult long)    Pulse 86    Ht 5' 8 (1.727 m)     Wt 212 lb (96.2 kg)    LMP 04/12/2008    Breastfeeding? No    BMI 32.23 kg/m??????  Physical Exam  Abdominal: There is tenderness.  Genitourinary: Vaginal discharge found.  Genitourinary Comments: No masses d/c seen and cx done pain over bladder        Assessment:      ICD-9-CM ICD-10-CM  1. Urinary tract infection without hematuria, site unspecified 599.0 N39.0  ROUTINE URINE CULTURE  2. Vaginitis and vulvovaginitis 616.10 N76.0 BACTERIAL VAGINITIS/VAGINOSIS PANEL  3. Postmenopausal atrophic vaginitis 627.3 N95.2  4. Pelvic pain in female 625.9 R10.2      Plan:    Orders Placed This Encounter  Procedures  ?????? vaginitis yeast trich BV panel - Affirm  ?????? Urine Culture - Routine    Encounter Meds  New Prescriptions   METRONIDAZOLE (FLAGYL) 500 MG TABLET    Take 1 Tab by mouth 3 times daily for 7  days.      Quantity: 21 Tab      Notes: --      Treatment Recommendations:  Will get urine cx and tx thereafter. Will send bd affirm but tx with flagyl and  advisem douching.Total time spent with the patient and  family discussing medical  issues was 15. This  was 50 % of the total time spent with the patient. Total  time was 20 minutes    Follow-up and Disposition   Return in about 6 months (around 09/22/2016).

## 2016-03-25 NOTE — Unmapped (Signed)
[facility] [address]  [address]  [place] [address]    Name:  Wanda Rowe Date of Birth: 1960-03-29 (56 y.o.)   MRN: [id number]    Date of Service:  03/25/2016     Subjective:     Chief Complaint   Patient presents with   ??? Sinusitis   ??? Cough   ??? Dizziness     History of Present Illness:  Wanda Rowe is a(n) 56 y.o. female here today for the following:   Sinusitis   Associated symptoms include coughing.   Cough         Current Outpatient Prescriptions   Medication Sig Dispense Refill   ??? albuterol (PROVENTIL;VENTOLIN;PROAIR) 90 mcg/actuation inhaler Inhale 1-2 puffs into the lungs every 6 hours as needed. 1 Inhaler 3   ??? ALPRAZolam (XANAX) 0.25 MG tablet Take 1 tablet (0.25 mg total) by mouth 3 times a day as needed for Sleep. 30 tablet 0   ??? bisoprolol-hydrochlorothiazide (ZIAC) 2.5-6.25 mg per tablet TAKE 1 TABLET BY MOUTH DAILY 30 tablet 0   ??? blood sugar diagnostic (ACCU-CHEK AVIVA PLUS TEST STRP) Strp fsbs qd 100 strip 1   ??? blood-glucose meter (GLUCOSE MONITORING KIT) kit by Other route 2 (two) times daily. Use as instructed - fsbs bid      ??? CALCIUM CITRATE ORAL Take 1 tablet by mouth daily.       ??? cholecalciferol, vitamin D3, 2,000 unit Cap Take 1 capsule by mouth daily. 1 capsule 0   ??? conjugated estrogens (PREMARIN) vaginal cream twicwe a week 42.5 g 0   ??? fluticasone (FLONASE) 50 mcg/actuation nasal spray Use 2 sprays into each nostril daily. 16 g 5   ??? gabapentin (NEURONTIN) 600 MG tablet Take 1 tablet (600 mg total) by mouth 3 times a day. 270 tablet 1   ??? ipratropium (ATROVENT) 0.03 % nasal spray Use 2 sprays into each nostril 2 times a day. 30 mL 5   ??? LACTOBACILLUS ACIDOPHILUS (PROBIOTIC ORAL) Take 1 capsule by mouth daily.     ??? levothyroxine (SYNTHROID, LEVOTHROID) 75 MCG tablet TAKE 1 TABLET(75 MCG) BY MOUTH DAILY 90 tablet 0   ??? AMOXicillin-clavulanate (AUGMENTIN) 875-125 mg per tablet Take 1 tablet by mouth 2 times a day. 20 tablet 0   ???  DULoxetine (CYMBALTA) 60 MG capsule Take by mouth.     ??? methylPREDNISolone (MEDROL, PAK,) 4 mg tablet follow package directions 21 Package 0   ??? valACYclovir (VALTREX) 500 MG tablet TAKE 1 TABLET BY MOUTH EVERY DAY 30 tablet 0     No current facility-administered medications for this visit.       Review of Systems   Respiratory: Positive for cough.             Objective:     Vitals:    03/25/16 1201   BP: 114/72   BP Location: Left arm   Patient Position: Sitting   BP Cuff Size: Regular   Pulse: 72   Resp: 20   Temp: 97.8 ??F (36.6 ??C)   TempSrc: Oral   Weight: 212 lb (96.2 kg)   Height: 5' 8 (1.727 m)     Body mass index is 32.23 kg/m??.    Physical Exam   Vitals reviewed.  Constitutional: She is oriented to person, place, and time. Vital signs are normal. She appears well-developed and well-nourished. She is cooperative.  Non-toxic appearance. She does not have a sickly appearance.  She appears ill. No distress.   HENT:   Head: Normocephalic and atraumatic.   Right Ear: Hearing, external ear and ear canal normal. Tympanic membrane is erythematous and bulging. Tympanic membrane is not injected. Tympanic membrane mobility is abnormal.   Left Ear: Hearing, external ear and ear canal normal. Tympanic membrane is bulging. Tympanic membrane is not injected and not erythematous. Tympanic membrane mobility is abnormal.   Nose: Mucosal edema and rhinorrhea present. Right sinus exhibits maxillary sinus tenderness and frontal sinus tenderness. Left sinus exhibits maxillary sinus tenderness and frontal sinus tenderness.   Mouth/Throat: Uvula is midline, oropharynx is clear and moist and mucous membranes are normal. No oropharyngeal exudate, posterior oropharyngeal edema, posterior oropharyngeal erythema or tonsillar abscesses.   Post nasal drainage    Cardiovascular: Normal rate, regular rhythm, normal heart sounds and intact distal pulses.  Exam reveals no gallop and no friction rub.    No murmur heard.  Pulmonary/Chest:  Effort normal and breath sounds normal. No accessory muscle usage. No respiratory distress. She has no decreased breath sounds. She has no wheezes. She has no rhonchi. She has no rales. She exhibits no tenderness.   Neurological: She is alert and oriented to person, place, and time.   Skin: Skin is warm and dry. No rash noted. She is not diaphoretic.   Psychiatric: She has a normal mood and affect. Her behavior is normal. Judgment and thought content normal.            Assessment/Plan:            Return if symptoms worsen or fail to improve.       Addison Naegeli, CNP

## 2016-03-25 NOTE — Telephone Encounter (Signed)
end 

## 2016-03-25 NOTE — Telephone Encounter (Signed)
Can you see pt today? Trouble breathing and coughing -Up flem off and on - she just swallow it   been using her inhaler.

## 2016-03-31 NOTE — Unmapped (Signed)
[facility]    03/31/2016    Wanda Rowe  DOB:Apr 22, 1960    Chief Complaint  Patient presents with  ?????? New Patient Consult    Clearance for Knee Surgery      HPI  Patient comes in for consultation regarding IVC filter placement in preparation  for knee replacement surgery by Dr. Jaynie Collins. She has been cleared now by Dr. Selena Batten to  have the surgery. She has a history of a DVT noted in August that took a while  to resolve. She went from Xarelto therapy to Lovenox therapy from August until  December. She is now off all AC. She was also told she had reflux in the veins  and was advised to have this evaluated. She states she has been tested for  inherited condition and this was negative. She believes it was just a series of  events back to back that caused it.    Past Medical History:  Diagnosis Date  ?????? Anemia   in past  ?????? Arthritis   knees  ?????? Asthma   in past  ?????? Back problem   occ back ache  ?????? Cancer (CMS HCC)   right breast- chemo. only in 2014 Lumpectomy  ?????? Chest pain   when under stress-testing done & no cause of chest pain was found  ?????? Complication of anesthesia   rxn to succinylcholine-day after surgery, pt  had difficulty w/ muscle  movement-dr j collins in anesthesia dept notified  ?????? Diabetes mellitus type II   resolved due to weight loss/ no meds  ?????? Dysplasia  ?????? GERD (gastroesophageal reflux disease)   no meds  ?????? Hernia   hiatal-resolved with wt loss surg  ?????? Hypertension   in past & no longer taking medication  ?????? Irregular heart beat   MVP & heart murmur-used to take pre-dental antibiotics  ?????? Liver disorder   pt told she has a fatty liver-resolved per pt  ?????? Osteopenia  ?????? RSD (reflex sympathetic dystrophy)  ?????? Sleep apnea   poss, but not dx'd  ?????? Thyroid disorder   hypothyroidism    Past Surgical History:  Procedure Laterality Date  ?????? HX BREAST BIOPSY  july 2014   right  ?????? HX BREAST LUMPECTOMY  02-2013   right; also bilat reduction; back to  surg next day for post op bleeding  ?????? HX COLPOSCOPY  ?????? HX FOOT SURGERY   right  ?????? HX GUM SURGERY   done for receding gums  ?????? HX HYSTERECTOMY, TOTAL ABDOMINAL  ?????? HX KNEE SURGERY   left x 3, right x 1  ?????? HX LAPAROSCOPY  ?????? HX OTHER SURGICAL HISTORY  jan 2014   gastric sleeve  ?????? HX OTHER SURGICAL HISTORY   ins. portacath  ?????? HX UPPER GI ENDOSCOPY   mult in past due to stomach polyps  ?????? HX VAGINAL DELIVERY  ?????? REMOVAL OF OVARY/TUBE(S)    Current Outpatient Prescriptions  Medication Sig Dispense Refill  ?????? albuterol (PROVENTIL HFA) 90 mcg/actuation HFA Aerosol Inhaler Take 2 Puffs by  inhalation every 6 hours as needed.  ?????? amoxicillin-clavulanate (AUGMENTIN) 875-125 mg per tablet Take 1 Tab by mouth  2 times daily.  ?????? bisoprolol-hydrochlorothiazide (ZIAC) 2.5-6.25 mg per tablet Take 1 Tab by  mouth daily. 1/2 tab daily  ?????? DULoxetine (CYMBALTA) 60 mg Capsule, Delayed Release(E.C.) Take 30 mg by mouth  daily. Reported on 07/24/2015  ?????? enoxaparin (LOVENOX) 100 mg/mL injection 1 mg/kg by Subcutaneous route  every  12 hours. Twice daily  ?????? ergocalciferol (ERGOCALCIFEROL) 50,000 unit Capsule Take 2,000 Units by mouth  daily. Last dose 01/30/13  ?????? estrogens, conjugated, (PREMARIN) 0.625 mg/gram Cream Insert into vagina twice  a week. 42.5 g 1  ?????? fluticasone (FLONASE) 50 mcg/Actuation NA nasal spray Spray 2 Sprays into nose  daily. Each nostril  ?????? gabapentin (NEURONTIN) 600 mg tablet Take 600 mg by mouth 3 times daily.  ?????? ipratropium (ATROVENT) 0.03 % NA nasal spray Spray 2 Sprays into nose 2 times  daily. Each nostril  ?????? lactobacillus rhamnosus (CULTURELLE) 10 billion cell Take 1 Cap by mouth  daily.  ?????? levothyroxine (SYNTHROID) 75 mcg PO tablet Take 75 mcg by mouth daily.  ?????? metroNIDAZOLE (FLAGYL) 500 mg tablet Take 1 Tab by mouth 3 times daily for 7  days. 21 Tab 0    No current facility-administered medications for this visit.    Allergies  Allergen Reactions  ?????? Azithromycin     **DOES NOT WORK**  ?????? Silver Rash  ?????? Succinylcholine Other (See Comments)    Difficulty w/ muscle movement the day after surgery  ?????? Sulfa (Sulfonamide Antibiotics) Swelling and Rash  ?????? Tegaderm Rash    Family History  Problem Relation Age of Onset  ?????? High Blood Pressure Mother  ?????? Diabetes Father  ?????? High Blood Pressure Father  ?????? Heart Problems Paternal Grandfather    mi  ?????? Cancer Other    unspecified grandmother  ?????? Diabetes Other    unspecified grandmother  ?????? Stroke Other    unspecified grandmother  ?????? Colon Cancer Maternal Grandfather 60  ?????? Cancer Maternal Grandmother 60    Bladder  ?????? Anesthesia Complications Neg Hx    ROS  ROS  General: Not Present- Weight loss , Weight gain, Anorexia, Fatigue and Fever.  Skin: Not Present- New Lesions, Rash and Skin Color Changes.  HEENT: Not Present- Tinnitus (ringing, roaring in ear) , Visual Disturbances and  Deafness.  Neck: Not Present- Neck Mass and Neck Pain.  Respiratory: Not Present- Shortness of breath , Cough and Wheezing.  Breast: Not Present- Breast Mass, Breast Pain, Nipple Discharge and Skin  Changes.  Cardiovascular: Left leg edema  Leg pain with walking , Chest Pain,  Hypertension, Night Cramps, Palpitations and Phlebitis.  Gastrointestinal: Not Present- Abdominal Mass, Abdominal Pain, Constipation,  Diarrhea, Nausea, Rectal Bleeding and Vomiting.  Musculoskeletal: Not Present- Muscle Pain, Joint Pain and Muscle Weakness.  Neurological: Not Present- Fainting, Decreased Memory, Headaches,  Incoordination, Seizures, Tremor, Vertigo and Weakness.  Psychiatric: Not Present- Anxiety and Depression.  Endocrine: Not Present- Cold Intolerance, Hair Changes and Heat Intolerance.  Hematology: Not Present- Anemia, Easy Bruising, Enlarged Lymph Nodes, Prolonged  Bleeding and Spontaneous Bleeding.    PHYSICAL EXAM  General  Memory: Recent memory intact and Remote memory intact. Mental Status-Pleasant.  General Appearance- Cooperative and Well groomed.  Orientation- Oriented X4.  Build & Nutrition- Healthy appearing, Well nourished and Well developed.  Posture- Normal posture. Hydration- Well hydrated.  Voice- Normal.  Skin:  General: Color- Normal color.    Head and Neck  Neck  Global Assessment- right jugular vein normal and left jugular vein normal.    Eye  Upper Eyelid- Left-Normal. Right-Normal. Lower Eyelid- Left-Normal.  Right-Normal.  Sclera/Conjunctiva- Left-Normal. Right-Normal.    ENMT  Nose and Sinuses  External Inspection of the Nose-Normal.  Mouth and Throat  Oral Cavity/Oropharynx: Teeth-normal. Gingiva-normal. Hard Palate-normal. Soft  Palate-normal. Oropharynx-normal.    Chest and Lung Exam  Inspection:  Movements-normal. Accessory muscles- No use of accessory muscles in  breathing.  Auscultation:  Breath sounds: - Normal    Cardiovascular  Inspection: Jugular vein- Left- Inspection normal. Right- Inspection Normal. BP  in 2+  Extremities- See Vitals Section for details  Palpation/Percussion: Heart- PMI in normal location. Abdominal Aorta-Normal  pulsations.  Auscultation: Heart Sounds-Normal heart sounds.  Murmurs & Other Heart Sounds: Auscultation of the heart reveals- No Murmurs.  Carotid arteries- No Bruit heard on Left or bruit heard on right. Abdominal  Aorta- No Bruit.    Abdomen  Inspection:  Ostomies- No ostomy present.  Palpation/Percussion: Palpation and Percussion of the abdomen revel- No rebound  tenderness, No Rigidity (guarding) and No Palpable abdominal masses.  Spleen: Other Characterisitics- No hepatosplenomegaly    Rectal  Anorectal Exam: Residue- Occult testing was not indicated for this patient    Peripheral Vascular  Upper Extremity: Inspection-Left-Pink nail beds, Pink skin and Rapid capillary  refill. No rash or Digital Clubbing. Right- Pink nail beds, Pink skin and Rapid  Capillary refill. No rash or Digital clubbing.  Palpation: Neuropathy-No neuropathy left or neuropathy right.  Temperature-Left-Normal.  Right-Normal.  Brachial pulse- Left-Normal. Right-Normal. Radial  pulse-Left-Normal. Right-Normal  Edema-Left-No edema. Right- No edema.  Lower Extremity:  Palpation: Neuropathy- No neuropathy left or neuropathy right.  Femoral pulse-Left-Normal. Right-Normal.  Popliteal pulse-Left-Normal. Right-Normal.  Dorsalis pedis pulse-Left-Normal. Right-Normal.  Posterior tibial pulse-Left-Normal. Right-Normal.  Edema-Left- Mild edema. Right-No edema.    Neurologic  Mental Status: Affect- normal. Gait-Normal    Neuropsychiatric Orientation  Oriented X3. The patient's mood and affect are described as- normal.    Musculoskeletal  General  Movements-  Spine  Deformities  Hand/Wrist  Fingers:  All: Deformities-Left-No deformities. Right- No deformrities. Swelling-Left-No  Generalized swelling. Right- No generalized swelling.     IMPRESSION      ICD-9-CM ICD-10-CM  1. Acute deep vein thrombosis (DVT) of left lower extremity, unspecified vein  (CMS HCC) 453.40 I82.402  2. RSD (reflex sympathetic dystrophy) 337.20 G90.50         PLAN AND RECOMMENDATION    Discussed reflux and subsequent edema. Due to the reflux and the history of DVT  she is advised to wear the compression stockings, particularly when she drives  over three hours or travels by air. She may pursue varicose vein procedure in  the future. She will continue compression stocking use.    Her scan from November was reviewed and showed:  FINAL IMPRESSIONS  1. There is an isolated acute, non-occlusive deep venous thrombosis   involving the left gastrocnemius vein just distal to the knee.  2. There is an isolated acute, non-occlusive superficial venous thrombosis   involving the left greater saphenous vein just distal to the knee.  3. There appears to be significant progression of thrombosis to the left   gastrocnemius vein and left greater saphenous vein, relative to the   previous exam of 09/04/15.    Her current scan shows:  FINAL IMPRESSIONS  1. There is no evidence of deep or superficial  venous thrombosis in the   left lower extremity.      Discussed risks and complications of IVC filter placement and removal as  outlined in consent.

## 2016-04-14 NOTE — Unmapped (Signed)
Jadin would like to follow you at [facility]. She is not due for follow up until November.  I offered one of your associates at [facility] and she said no.  Thanks

## 2016-04-15 NOTE — Unmapped (Addendum)
Progress Notes by Ophelia Shoulder., RMA at 04/15/16 1045     Author:  Ophelia Shoulder., RMA Service:  Orthopedics Author Type:  Medical Assistant    Filed:  04/23/16 1321 Encounter Date:  04/15/2016 Status:  Signed    Editor:  Fawn Kirk., MD (Physician)  Cosigner:  Fawn Kirk., MD at 04/23/16 1623       Subjective    Subjective:   Patient ID: Wanda Rowe  Age: 56 y.o. (DOB: 1960/12/11)  Ethnicity: Non-Hispanic  Race: White or Caucasian  Gender: female    Chief Complaint:  Chief Complaint     Patient presents with     ??????? Pre-Op      lt tka 05/26/16            HPI:She is here today to discuss her upcoming left total knee replacement for May 26, 2016.  She has been rescanned and her blood clots are gone.  She is ready to have this knee replaced.  She does have a history of ACL repair.  She has questions and   concerns regarding the blood thinners she will be on after surgery.  Dr. Selena Batten has advised an IVC filter which she will be getting prior to surgery.  He is requesting her to be on full dose Lovenox for 3-4 weeks postoperative.      Ortho Exam: Left knee has mild warmth and swelling, no erythema or effusion.  She has a flexion contracture with varus deformity as well as a varus thrust on ambulation.  She walks with a limp.  Pulses are strong.  Alert and oriented, neurologically   intact.      ROS:       Objective      Review of Systems  Musculoskeletal: Knee Pain, (-) Extremity Swelling  Constitutional: (-) Fever, (-) Chills, (-) Weakness  Neurological: (-) Tingling, (-) Numbness      Patient Vitals:  Vitals  Height: 5' 8 (172.7 cm)  Weight: 210 lb (95.3 kg)         X-Ray/Injection In Clinic Procedure Notes         In Clinic Administered Meds Billing Data    None         Assessment/Impression:     Encounter Diagnoses      Code  Name Primary?   ??????? M17.12 Primary osteoarthritis of left knee Yes         Plan:   No orders of the defined types were placed in this encounter.    This patient suffers  from chronically painful and advanced osteoarthritis to the left knee.  Her previous surgery dates have been canceled due to getting blood clots.  She has finally been cleared for total knee replacement.  I discussed the procedure,   risks and benefits as well as postoperative care with the patient today.  I will request a consult with Dr. Selena Batten regarding Lovenox being full dose postoperative as that puts her at higher risk for hematoma.  I will see her back at surgery.  All questions   have been answered and understood.    I, [name], RMA am scribing for, and in the presence of Dr. George Ina, including xray interpretation if performed.[JB.1]    I, George Ina, MD personally performed the services described in the documentation as scribed by [name], RMA in my presence, and confirm it is both accurate and complete.[EL.1]    Please note that documentation was completed using  dragon speak and some errors may have occurred and have not been identified.  Please check the chart for addendums.  If there are major errors that you have concern about please contact the office for   clarification.[JB.1]             Electronically signed by Fawn Kirk., MD on 04/23/16 1623.   Attribution Key    EL.1 - Fawn Kirk., MD on 04/23/16 1320  JB.1 - Ophelia Shoulder., RMA on 04/16/16 1318          Revision History      Date/Time User Provider Type Action   > 04/23/16 1321 Fawn Kirk., MD Physician Sign    04/16/16 1321 Samara Deist Lise Auer., RMA Medical Assistant Sign at close encounter

## 2016-04-17 LAB — T4, FREE: T4, Free (Direct): 1.14 ng/dL (ref 0.82–1.77)

## 2016-04-17 LAB — TSH: TSH: 0.799 u[IU]/mL (ref 0.450–4.500)

## 2016-04-17 LAB — T3: T3, Total: 112 ng/dL (ref 71–180)

## 2016-04-22 ENCOUNTER — Ambulatory Visit: Admit: 2016-04-22 | Discharge: 2016-04-22 | Payer: PRIVATE HEALTH INSURANCE

## 2016-04-22 DIAGNOSIS — N390 Urinary tract infection, site not specified: Secondary | ICD-10-CM

## 2016-04-22 LAB — POCT URINALYSIS DIPSTICK, NONAUTOMATED; W/O MICRO
POCT - Bilirubin, UA: NEGATIVE
POCT - Blood, UA: NEGATIVE
POCT - Glucose, UA: NEGATIVE
POCT - Ketones, UA: NEGATIVE
POCT - Leukocytes Esterase, UA: NEGATIVE
POCT - Nitrite, UA: NEGATIVE
POCT - Protein, UA: NEGATIVE
POCT - Specific Gravity, Urine: 1.01
POCT - Urobilinogen, UA: NEGATIVE
POCT - pH, UA: 6

## 2016-04-22 MED ORDER — gabapentin (NEURONTIN) 600 MG tablet
600 | ORAL_TABLET | Freq: Three times a day (TID) | ORAL | 5 refills | Status: AC
Start: 2016-04-22 — End: 2016-09-22

## 2016-04-22 MED ORDER — DULoxetine (CYMBALTA) 60 MG capsule
60 | ORAL_CAPSULE | Freq: Every day | ORAL | 5 refills | Status: AC
Start: 2016-04-22 — End: 2016-09-22

## 2016-04-22 MED ORDER — bisoprolol-hydrochlorothiazide (ZIAC) 2.5-6.25 mg per tablet
2.5-6.25 | ORAL_TABLET | Freq: Every day | ORAL | 5 refills | 75.00000 days | Status: AC
Start: 2016-04-22 — End: 2016-07-01

## 2016-04-22 MED ORDER — ipratropium (ATROVENT) 0.03 % nasal spray
21 | Freq: Two times a day (BID) | NASAL | 5 refills | 30.00000 days | Status: AC
Start: 2016-04-22 — End: 2017-03-21

## 2016-04-22 NOTE — Assessment & Plan Note (Signed)
Cont cymbalta

## 2016-04-22 NOTE — Assessment & Plan Note (Signed)
ziac

## 2016-04-22 NOTE — Unmapped (Addendum)
[facility] [address]  [place] [address]    Name:  Wanda Rowe Date of Birth: 1960-10-19 (56 y.o.)   MRN: [id number]    Date of Service:  04/22/2016     Subjective:     Chief Complaint   Patient presents with   ??? Anxiety   ??? Depression   ??? Hyperglycemia   ??? Urinary Tract Infection     had uti at GYN they wanted her to repeat dip at this appt     History of Present Illness:  Wanda Rowe is a(n) 56 y.o. female here today for the following:   Recent uti treated by urgent care and then gyn needs recheck. F/u htn, RSD, anxiety, dvt dx on 09/04/15 took xeralto x 3 mos didn't resolve then did lovenox and resolved on f/u doppler 02/20/16. Getting IVC placed before TKR 05/26/16. F/u on thyroid also c/o right ear pain        Current Outpatient Prescriptions   Medication Sig Dispense Refill   ??? albuterol (PROVENTIL;VENTOLIN;PROAIR) 90 mcg/actuation inhaler Inhale 1-2 puffs into the lungs every 6 hours as needed. 1 Inhaler 3   ??? ALPRAZolam (XANAX) 0.25 MG tablet Take 1 tablet (0.25 mg total) by mouth 3 times a day as needed for Sleep. 30 tablet 0   ??? bisoprolol-hydrochlorothiazide (ZIAC) 2.5-6.25 mg per tablet Take 1 tablet by mouth daily. 30 tablet 0   ??? blood sugar diagnostic (ACCU-CHEK AVIVA PLUS TEST STRP) Strp fsbs qd 100 strip 1   ??? blood-glucose meter (GLUCOSE MONITORING KIT) kit by Other route 2 (two) times daily. Use as instructed - fsbs bid      ??? CALCIUM CITRATE ORAL Take 1 tablet by mouth daily.       ??? cholecalciferol, vitamin D3, 2,000 unit Cap Take 1 capsule by mouth daily. 1 capsule 0   ??? conjugated estrogens (PREMARIN) vaginal cream twicwe a week 42.5 g 0   ??? cranberry 400 mg Cap Take by mouth.     ??? DULoxetine (CYMBALTA) 60 MG capsule Take by mouth.     ??? fluticasone (FLONASE) 50 mcg/actuation nasal spray Use 2 sprays into each nostril daily. 16 g 5   ??? gabapentin (NEURONTIN) 600 MG tablet Take 1 tablet (600 mg total) by mouth 3 times a day. 270 tablet 1    ??? ipratropium (ATROVENT) 0.03 % nasal spray Use 2 sprays into each nostril 2 times a day. 30 mL 5   ??? LACTOBACILLUS ACIDOPHILUS (PROBIOTIC ORAL) Take 1 capsule by mouth daily.     ??? levothyroxine (SYNTHROID, LEVOTHROID) 75 MCG tablet TAKE 1 TABLET(75 MCG) BY MOUTH DAILY 90 tablet 0   ??? valACYclovir (VALTREX) 500 MG tablet TAKE 1 TABLET BY MOUTH EVERY DAY 30 tablet 0     No current facility-administered medications for this visit.       Review of Systems   Constitutional: Negative for fatigue.   All other systems reviewed and are negative.           Objective:     Vitals:    04/22/16 0833   BP: 112/70   Pulse: 75   Resp: 15   SpO2: 98%   Weight: 214 lb (97.1 kg)   Height: 5' 8 (1.727 m)     Body mass index is 32.54 kg/m??.    Physical Exam   Constitutional: She appears well-developed and well-nourished.   HENT:   Head: Normocephalic and  atraumatic.   Right tonsil enlarged   Cardiovascular: Normal rate, regular rhythm and normal heart sounds.  Exam reveals no gallop and no friction rub.    No murmur heard.  Pulmonary/Chest: Effort normal and breath sounds normal.   Abdominal: Soft. Bowel sounds are normal.   Musculoskeletal: Normal range of motion.   Psychiatric: She has a normal mood and affect. Her behavior is normal.            Assessment/Plan:       RSD lower limb  Cont neurontin    Borderline systolic HTN  ziac    Anxiety and depression  Cont cymbalta    Acquired hypothyroidism  Cont synthoid                    Sandy Blouch Johnn Hai, MD

## 2016-04-22 NOTE — Unmapped (Signed)
Cont neurontin

## 2016-04-22 NOTE — Assessment & Plan Note (Signed)
Cont synthoid

## 2016-05-14 ENCOUNTER — Ambulatory Visit: Admit: 2016-05-14 | Discharge: 2016-05-14 | Payer: PRIVATE HEALTH INSURANCE

## 2016-05-14 DIAGNOSIS — D649 Anemia, unspecified: Secondary | ICD-10-CM

## 2016-05-14 NOTE — Assessment & Plan Note (Signed)
Ok for left TKR

## 2016-05-14 NOTE — Assessment & Plan Note (Signed)
neurontin

## 2016-05-14 NOTE — Assessment & Plan Note (Signed)
Cont synthroid

## 2016-05-14 NOTE — Assessment & Plan Note (Addendum)
Cont ziac 1.25 mg qd

## 2016-05-14 NOTE — Assessment & Plan Note (Signed)
cymbalta 

## 2016-05-14 NOTE — Assessment & Plan Note (Signed)
Cont vitamins monitor

## 2016-05-14 NOTE — Unmapped (Signed)
h/o in LLE DVT in august. Will have filter placed prior to surgery and anticoag in consultation with Dr. Selena Batten

## 2016-05-14 NOTE — Unmapped (Signed)
[facility] [address]  [address]  [place] [address]    Name:  Wanda Rowe Date of Birth: 1960-03-23 (56 y.o.)   MRN: [id number]    Date of Service:  05/14/2016     Subjective:     Chief Complaint   Patient presents with   ??? Pre-op Exam     05/26/16 / [facility] / Dr Jaynie Collins / Total Knee Replacement F [phone]  PT DID BLOOD URINE EKG this AM     History of Present Illness:  Wanda Rowe Wanda Rowe is a(n) 56 y.o. female here today for the following:   Preop consultation for left TKR    Past Medical History:  No date: Anxiety  No date: Asthma      Comment: Mild  No date: Cancer (CMS Dx)      Comment: breast  No date: Diabetes mellitus (CMS Dx)      Comment: Type 2   resolved with gastric sleeve  No date: GERD (gastroesophageal reflux disease)  No date: Hyperlipidemia  No date: Hypertension  No date: Hypothyroidism  No date: Polyp of stomach  No date: RSD (reflex sympathetic dystrophy)      Comment: R knee    Past Surgical History:  No date: ANTERIOR CRUCIATE LIGAMENT REPAIR      Comment: L   No date: BREAST SURGERY Right      Comment: Lumpectomy chemo and radiation  No date: DENTAL SURGERY      Comment: Periodontal   07/19/2015: ESOPHAGOGASTRODUODENOSCOPY N/A      Comment: Procedure: ESOPHAGOGASTRODUODENOSCOPY WITH                COLONOSCOPY WITH MAC;  Surgeon: Lurene Shadow, MD;  Location: [facility] ENDOSCOPY;                 Service: Gastroenterology;  Laterality: N/A;  No date: FOOT SURGERY  No date: gastric sleeve  No date: HYSTERECTOMY  No date: MENISCECTOMY      Comment: L x 2, R x 1    Social History    Marital status: Single              Spouse name:                       Years of education:                 Number of children:               Occupational History  Occupation          Emergency planning/management officer                                    Social History Main Topics    Smoking status: Never Smoker  Smokeless tobacco: Never Used                        Comment: (06/23/2006)    Alcohol use: Yes                Comment: Occasionally (06/23/2006)    Drug use: Not on file     Sexual activity: Not on file          Other Topics            Concern  Caffeine Use            No  Exercise                No    Social History Narrative    None on file        Review of patient'Wanda family history indicates:    Hypertension                   Mother                    Other                          Mother                      Comment: DDD    Diabetes                       Father                      Comment: Type 2    Hypertension                   Father                    Factor VIII deficiency         Father                    Other                          Brother                     Comment: Back Pain    Diabetes                       Maternal Grandmother        Comment: Type 2    Cancer                         Maternal Grandmother        Comment: Bladder CA    Stroke                         Maternal Grandmother      Colon Cancer                   Maternal Grandfather      Breast Cancer                  Paternal Grandmother        Current Outpatient Prescriptions on File Prior to Visit:  albuterol (PROVENTIL;VENTOLIN;PROAIR) 90 mcg/actuation inhaler, Inhale  1-2 puffs into the lungs every 6 hours as needed., Disp: 1 Inhaler, Rfl: 3  bisoprolol-hydrochlorothiazide (ZIAC) 2.5-6.25 mg per tablet, Take 1 tablet by mouth daily., Disp: 30 tablet, Rfl: 5  blood sugar diagnostic (ACCU-CHEK AVIVA PLUS TEST STRP) Strp, fsbs qd, Disp: 100 strip, Rfl: 1  blood-glucose meter (GLUCOSE MONITORING KIT) kit, by Other route 2 (two) times daily. Use as instructed - fsbs bid , Disp: , Rfl:   CALCIUM CITRATE ORAL, Take 1 tablet by mouth daily.  , Disp: , Rfl:   cholecalciferol, vitamin D3, 2,000 unit Cap, Take 1 capsule by mouth daily., Disp: 1 capsule, Rfl: 0  conjugated estrogens (PREMARIN) vaginal cream, twicwe a  week, Disp: 42.5 g, Rfl: 0  cranberry 400 mg Cap, Take by mouth., Disp: , Rfl:   DULoxetine (CYMBALTA) 60 MG capsule, Take 1 capsule (60 mg total) by mouth daily., Disp: 30 capsule, Rfl: 5  fluticasone (FLONASE) 50 mcg/actuation nasal spray, Use 2 sprays into each nostril daily., Disp: 16 g, Rfl: 5  gabapentin (NEURONTIN) 600 MG tablet, Take 1 tablet (600 mg total) by mouth 3 times a day., Disp: 270 tablet, Rfl: 5  ipratropium (ATROVENT) 0.03 % nasal spray, Use 2 sprays into each nostril 2 times a day., Disp: 30 mL, Rfl: 5  LACTOBACILLUS ACIDOPHILUS (PROBIOTIC ORAL), Take 1 capsule by mouth daily., Disp: , Rfl:   levothyroxine (SYNTHROID, LEVOTHROID) 75 MCG tablet, TAKE 1 TABLET(75 MCG) BY MOUTH DAILY, Disp: 90 tablet, Rfl: 0  valACYclovir (VALTREX) 500 MG tablet, TAKE 1 TABLET BY MOUTH EVERY DAY, Disp: 30 tablet, Rfl: 0    No current facility-administered medications on file prior to visit.             Current Outpatient Prescriptions   Medication Sig Dispense Refill   ??? albuterol (PROVENTIL;VENTOLIN;PROAIR) 90 mcg/actuation inhaler Inhale 1-2 puffs into the lungs every 6 hours as needed. 1 Inhaler 3   ??? bisoprolol-hydrochlorothiazide (ZIAC) 2.5-6.25 mg per tablet Take 1 tablet by mouth daily. 30 tablet 5   ??? blood sugar diagnostic (ACCU-CHEK AVIVA PLUS TEST STRP) Strp fsbs qd 100 strip 1   ??? blood-glucose meter (GLUCOSE MONITORING KIT) kit by Other route 2 (two) times daily. Use as instructed - fsbs bid      ??? CALCIUM CITRATE ORAL Take 1 tablet by mouth daily.       ??? cholecalciferol, vitamin D3, 2,000 unit Cap Take 1 capsule by mouth daily. 1 capsule 0   ??? conjugated estrogens (PREMARIN) vaginal cream twicwe a week 42.5 g 0   ??? cranberry 400 mg Cap Take by mouth.     ??? DULoxetine (CYMBALTA) 60 MG capsule Take 1 capsule (60 mg total) by mouth daily. 30 capsule 5   ??? fluticasone (FLONASE) 50 mcg/actuation nasal spray Use 2 sprays into each nostril daily. 16 g 5   ??? gabapentin (NEURONTIN) 600 MG tablet Take 1 tablet  (600 mg total) by mouth 3 times a day. 270 tablet 5   ??? ipratropium (ATROVENT) 0.03 % nasal spray Use 2 sprays into each nostril 2 times a day. 30 mL 5   ??? LACTOBACILLUS ACIDOPHILUS (PROBIOTIC ORAL) Take 1 capsule by mouth daily.     ??? levothyroxine (SYNTHROID, LEVOTHROID) 75 MCG tablet TAKE 1 TABLET(75 MCG) BY MOUTH DAILY 90 tablet 0   ??? valACYclovir (VALTREX) 500 MG tablet TAKE 1 TABLET BY MOUTH EVERY DAY 30 tablet 0     No current facility-administered medications for this visit.  Review of Systems   Constitutional: Negative for fatigue.   All other systems reviewed and are negative.           Objective:     Vitals:    05/14/16 1234   BP: 116/72   Resp: 15   Temp: 98.6 ??F (37 ??C)   SpO2: 98%   Weight: 213 lb (96.6 kg)   Height: 5' 8 (1.727 m)     Body mass index is 32.39 kg/m??.    Physical Exam   Constitutional: She appears well-developed and well-nourished.   HENT:   Head: Normocephalic and atraumatic.   Cardiovascular: Normal rate, regular rhythm and normal heart sounds.  Exam reveals no gallop and no friction rub.    No murmur heard.  Pulmonary/Chest: Effort normal and breath sounds normal.   Abdominal: Soft. Bowel sounds are normal.   Musculoskeletal: Normal range of motion.   Psychiatric: She has a normal mood and affect. Her behavior is normal.            Assessment/Plan:       Primary osteoarthritis of left knee  Ok for left TKR    RSD lower limb  neurontin    Borderline systolic HTN  Cont ziac 1.25 mg qd    DVT (deep venous thrombosis)  h/o in LLE DVT in august. Will have filter placed prior to surgery and anticoag in consultation with Dr. Selena Batten    Acquired hypothyroidism  Cont synthroid    H/O bariatric surgery  Cont vitamins monitor     Anxiety and depression  cymbalta       Return in about 3 months (around 08/13/2016).         Daymond Cordts Johnn Hai, MD

## 2016-05-21 NOTE — Unmapped (Signed)
Wanda Rowe S Mckinny  DOB:[birth year]       Chief Complaint  Patient presents with  ?????? New Patient Consult      Clearance for Knee Surgery        HPI  Patient comes in for consultation regarding IVC filter placement in preparation  for knee replacement surgery by Dr. Jaynie Collins. She has been cleared now by Dr. Selena Batten to  have the surgery. She has a history of a DVT noted in August that took a while  to resolve. She went from Xarelto therapy to Lovenox therapy from August until  December. She is now off all AC. She was also told she had reflux in the veins  and was advised to have this evaluated. She states she has been tested for  inherited condition and this was negative. She believes it was just a series of  events back to back that caused it.     Past Medical History    Past Medical History:  Diagnosis Date  ?????? Anemia      in past  ?????? Arthritis      knees  ?????? Asthma      in past  ?????? Back problem      occ back ache  ?????? Cancer (CMS HCC)      right breast- chemo. only in 2014 Lumpectomy  ?????? Chest pain      when under stress-testing done & no cause of chest pain was found  ?????? Complication of anesthesia      rxn to succinylcholine-day after surgery, pt  had difficulty w/ muscle  movement-dr j collins in anesthesia dept notified  ?????? Diabetes mellitus type II      resolved due to weight loss/ no meds  ?????? Dysplasia    ?????? GERD (gastroesophageal reflux disease)      no meds  ?????? Hernia      hiatal-resolved with wt loss surg  ?????? Hypertension      in past & no longer taking medication  ?????? Irregular heart beat      MVP & heart murmur-used to take pre-dental antibiotics  ?????? Liver disorder      pt told she has a fatty liver-resolved per pt  ?????? Osteopenia    ?????? RSD (reflex sympathetic dystrophy)    ?????? Sleep apnea      poss, but not dx'd  ?????? Thyroid disorder      hypothyroidism       Past Surgical History    Past Surgical History:  Procedure Laterality Date  ?????? HX BREAST BIOPSY   july 2014    right  ?????? HX BREAST LUMPECTOMY    02-2013    right; also bilat reduction; back to surg next day for post op bleeding  ?????? HX COLPOSCOPY      ?????? HX FOOT SURGERY        right  ?????? HX GUM SURGERY        done for receding gums  ?????? HX HYSTERECTOMY, TOTAL ABDOMINAL      ?????? HX KNEE SURGERY        left x 3, right x 1  ?????? HX LAPAROSCOPY      ?????? HX OTHER SURGICAL HISTORY   jan 2014    gastric sleeve  ?????? HX OTHER SURGICAL HISTORY        ins. portacath  ?????? HX UPPER GI ENDOSCOPY        mult in past due to stomach polyps  ?????? HX VAGINAL  DELIVERY      ?????? REMOVAL OF OVARY/TUBE(S)           Current Medications    Current Outpatient Prescriptions  Medication Sig Dispense Refill  ?????? albuterol (PROVENTIL HFA) 90 mcg/actuation HFA Aerosol Inhaler Take 2 Puffs by  inhalation every 6 hours as needed.      ?????? amoxicillin-clavulanate (AUGMENTIN) 875-125 mg per tablet Take 1 Tab by mouth  2 times daily.      ?????? bisoprolol-hydrochlorothiazide (ZIAC) 2.5-6.25 mg per tablet Take 1 Tab by  mouth daily. 1/2 tab daily      ?????? DULoxetine (CYMBALTA) 60 mg Capsule, Delayed Release(E.C.) Take 30 mg by mouth  daily. Reported on 07/24/2015      ?????? enoxaparin (LOVENOX) 100 mg/mL injection 1 mg/kg by Subcutaneous route every  12 hours. Twice daily      ?????? ergocalciferol (ERGOCALCIFEROL) 50,000 unit Capsule Take 2,000 Units by mouth  daily. Last dose 01/30/13      ?????? estrogens, conjugated, (PREMARIN) 0.625 mg/gram Cream Insert into vagina twice  a week. 42.5 g 1  ?????? fluticasone (FLONASE) 50 mcg/Actuation NA nasal spray Spray 2 Sprays into nose  daily. Each nostril      ?????? gabapentin (NEURONTIN) 600 mg tablet Take 600 mg by mouth 3 times daily.      ?????? ipratropium (ATROVENT) 0.03 % NA nasal spray Spray 2 Sprays into nose 2 times  daily. Each nostril       ?????? lactobacillus rhamnosus (CULTURELLE) 10 billion cell Take 1 Cap by mouth  daily.      ?????? levothyroxine (SYNTHROID) 75 mcg PO tablet Take 75 mcg by mouth daily.      ?????? metroNIDAZOLE (FLAGYL) 500 mg tablet Take 1 Tab  by mouth 3 times daily for 7  days. 21 Tab 0     No current facility-administered medications for this visit.         Allergies  Allergen Reactions  ?????? Azithromycin        **DOES NOT WORK**  ?????? Silver Rash  ?????? Succinylcholine Other (See Comments)      Difficulty w/ muscle movement the day after surgery  ?????? Sulfa (Sulfonamide Antibiotics) Swelling and Rash  ?????? Tegaderm Rash     Family History    Family History  Problem Relation Age of Onset  ?????? High Blood Pressure Mother    ?????? Diabetes Father    ?????? High Blood Pressure Father    ?????? Heart Problems Paternal Grandfather        mi  ?????? Cancer Other        unspecified grandmother  ?????? Diabetes Other        unspecified grandmother  ?????? Stroke Other        unspecified grandmother  ?????? Colon Cancer Maternal Grandfather 60  ?????? Cancer Maternal Grandmother 60      Bladder  ?????? Anesthesia Complications Neg Hx         ROS  ROS  General: Not Present- Weight loss , Weight gain, Anorexia, Fatigue and Fever.  Skin: Not Present- New Lesions, Rash and Skin Color Changes.  HEENT: Not Present- Tinnitus (ringing, roaring in ear) , Visual Disturbances and  Deafness.  Neck: Not Present- Neck Mass and Neck Pain.  Respiratory: Not Present- Shortness of breath , Cough and Wheezing.  Breast: Not Present- Breast Mass, Breast Pain, Nipple Discharge and Skin  Changes.  Cardiovascular: Left leg edema  Leg pain with walking , Chest Pain,  Hypertension, Night Cramps,  Palpitations and Phlebitis.  Gastrointestinal: Not Present- Abdominal Mass, Abdominal Pain, Constipation,  Diarrhea, Nausea, Rectal Bleeding and Vomiting.  Musculoskeletal: Not Present- Muscle Pain, Joint Pain and Muscle Weakness.  Neurological: Not Present- Fainting, Decreased Memory, Headaches,  Incoordination, Seizures, Tremor, Vertigo and Weakness.  Psychiatric: Not Present- Anxiety and Depression.  Endocrine: Not Present- Cold Intolerance, Hair Changes and Heat Intolerance.  Hematology: Not Present- Anemia, Easy Bruising,  Enlarged Lymph Nodes, Prolonged  Bleeding and Spontaneous Bleeding.     PHYSICAL EXAM  General  Memory: Recent memory intact and Remote memory intact. Mental Status-Pleasant.  General Appearance- Cooperative and Well groomed. Orientation- Oriented X4.  Build & Nutrition- Healthy appearing, Well nourished and Well developed.  Posture- Normal posture. Hydration- Well hydrated.  Voice- Normal.  Skin:  General: Color- Normal color.     Head and Neck  Neck  Global Assessment- right jugular vein normal and left jugular vein normal.     Eye  Upper Eyelid- Left-Normal. Right-Normal. Lower Eyelid- Left-Normal.  Right-Normal.  Sclera/Conjunctiva- Left-Normal. Right-Normal.     ENMT  Nose and Sinuses  External Inspection of the Nose-Normal.  Mouth and Throat  Oral Cavity/Oropharynx: Teeth-normal. Gingiva-normal. Hard Palate-normal. Soft  Palate-normal. Oropharynx-normal.     Chest and Lung Exam  Inspection: Movements-normal. Accessory muscles- No use of accessory muscles in  breathing.  Auscultation:  Breath sounds: - Normal     Cardiovascular  Inspection: Jugular vein- Left- Inspection normal. Right- Inspection Normal. BP  in 2+  Extremities- See Vitals Section for details  Palpation/Percussion: Heart- PMI in normal location. Abdominal Aorta-Normal  pulsations.  Auscultation: Heart Sounds-Normal heart sounds.  Murmurs & Other Heart Sounds: Auscultation of the heart reveals- No Murmurs.  Carotid arteries- No Bruit heard on Left or bruit heard on right. Abdominal  Aorta- No Bruit.     Abdomen  Inspection:  Ostomies- No ostomy present.  Palpation/Percussion: Palpation and Percussion of the abdomen revel- No rebound  tenderness, No Rigidity (guarding) and No Palpable abdominal masses.  Spleen: Other Characterisitics- No hepatosplenomegaly     Rectal  Anorectal Exam: Residue- Occult testing was not indicated for this patient     Peripheral Vascular  Upper Extremity: Inspection-Left-Pink nail beds, Pink skin and Rapid  capillary  refill. No rash or Digital Clubbing. Right- Pink nail beds, Pink skin and Rapid  Capillary refill. No rash or Digital clubbing.  Palpation: Neuropathy-No neuropathy left or neuropathy right.  Temperature-Left-Normal.  Right-Normal. Brachial pulse- Left-Normal. Right-Normal. Radial  pulse-Left-Normal. Right-Normal  Edema-Left-No edema. Right- No edema.  Lower Extremity:  Palpation: Neuropathy- No neuropathy left or neuropathy right.  Femoral pulse-Left-Normal. Right-Normal.  Popliteal pulse-Left-Normal. Right-Normal.  Dorsalis pedis pulse-Left-Normal. Right-Normal.  Posterior tibial pulse-Left-Normal. Right-Normal.  Edema-Left- Mild edema. Right-No edema.     Neurologic  Mental Status: Affect- normal. Gait-Normal     Neuropsychiatric Orientation  Oriented X3. The patient's mood and affect are described as- normal.     Musculoskeletal  General  Movements-  Spine  Deformities  Hand/Wrist  Fingers:  All: Deformities-Left-No deformities. Right- No deformrities. Swelling-Left-No  Generalized swelling. Right- No generalized swelling.      IMPRESSION         ICD-9-CM ICD-10-CM    1. Acute deep vein thrombosis (DVT) of left lower extremity, unspecified vein  (CMS HCC) 453.40 I82.402    2. RSD (reflex sympathetic dystrophy) 337.20 G90.50           PLAN AND RECOMMENDATION     Discussed reflux and subsequent edema. Due to the  reflux and the history of DVT  she is advised to wear the compression stockings, particularly when she drives  over three hours or travels by air. She may pursue varicose vein procedure in  the future. She will continue compression stocking use.     Her scan from November was reviewed and showed:  FINAL IMPRESSIONS  1. There is an isolated acute, non-occlusive deep venous thrombosis   involving the left gastrocnemius vein just distal to the knee.  2. There is an isolated acute, non-occlusive superficial venous thrombosis   involving the left greater saphenous vein just distal to the knee.  3.  There appears to be significant progression of thrombosis to the left   gastrocnemius vein and left greater saphenous vein, relative to the   previous exam of 09/04/15.     Her current scan shows:  FINAL IMPRESSIONS  1. There is no evidence of deep or superficial venous thrombosis in the   left lower extremity.        Discussed risks and complications of IVC filter placement and removal as  outlined in consent.

## 2016-05-22 DIAGNOSIS — D689 Coagulation defect, unspecified: Secondary | ICD-10-CM | POA: Insufficient documentation

## 2016-05-22 NOTE — Unmapped (Signed)
[facility]    PATIENT NAME: Wanda Rowe, Wanda Rowe                              MR#: [id number]  DATE OF BIRTH: 07/29/1960                           Account #: [id number]  ADMITTING: Estelle June P                             ROOM #: [id number]  ATTENDING: Estelle June P                       NURSING UNIT: CVRU  PRIMARY: Ranette Luckadoo K                          ADMIT DATE: 05/22/2016  REFERRING: Doneta Public, SASHI P                     DISCHARGE DATE: 05/22/2016  DICTATED BY: Estelle June                               OPERATIVE REPORT    DATE OF OPERATION: 05/22/16    PREOPERATIVE DIAGNOSES:  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    POSTOPERATIVE DIAGNOSES:  Congenital clotting disorder with planned knee  surgery.    PROCEDURES PERFORMED:  1.  Inferior venacavogram.  2.  Placement of inferior vena caval filter.  3.  Ultrasound guided access of right common femoral vein.    SURGEON:  Dr. Estelle June    ANESTHESIA:  Conscious sedation with Versed and fentanyl along with local  anesthetic.  Conscious sedation start time 13:12, end time 13:24.  Trained  observer Ronny Flurry, RN.    REASON FOR THE PROCEDURE:  The patient is a 56 year old female with history of  hereditary clotting disorder.  She was scheduled for knee replacement surgery.  She was felt to be a high risk candidate for pulmonary embolisms.  She was  therefore consented for placement of a temporary vena caval filter.  Risks  discussed include bleeding, infection, the possibility of a pulmonary embolism  and the possibility of filter migration were discussed.  The patient understood  these risks and wished to proceed.  We also talked about the possibility of not  being able to remove the filter.  The patient understood all these risks and  wished to proceed.    PROCEDURE IN DETAIL:  The patient was taken to angiography suite, placed on  table in supine position, bilateral groins were prepped and draped in  usual  sterile fashion.  IV sedation administered.  Using ultrasound to identify the  right common femoral vein and injected the overlying tissue with 1% lidocaine.  We gained access using a micropuncture needle and a 5-French sheath was placed.  A pigtail catheter was placed in the caval bifurcation and IVC gram was carried  out which showed a widely patent inferior cava as well as iliac veins.  The vena  cava measured approximately 22 mm in diameter.  The femoral ALN filter was then  prepped with heparinized saline and a 5-French sheath was then removed.  The  provided 7-French sheath was advanced under fluoroscopy  to approximate level of  L2 vertebral body.  The filter assembly was advanced under fluoroscopy.  The  sheath was then carefully withdrawn and the filter was deployed without  difficulty.  Repeat contrast study was done that showed good position of the  filter below the level of renal veins.  The sheath was then removed.  Pressure  was applied for 10 minutes and hemostasis was secured.  The patient tolerated  procedure well.      Dict: Emeline General, M.D.  Auth: Emeline General, M.D.  D: 05/22/2016 16:32:40  T: 05/22/2016 21:49:10  Orig. Job# [id number]  Dictation ID: [id number]        cc:  Si Raider.Tereso Newcomer MD

## 2016-05-25 MED ORDER — levothyroxine (SYNTHROID, LEVOTHROID) 75 MCG tablet
75 | ORAL_TABLET | ORAL | 0 refills | Status: AC
Start: 2016-05-25 — End: 2016-07-01

## 2016-05-26 DIAGNOSIS — M1712 Unilateral primary osteoarthritis, left knee: Secondary | ICD-10-CM | POA: Insufficient documentation

## 2016-05-26 NOTE — Unmapped (Signed)
Total Knee Arthroplasty    Documentation of Medical Necessity  I hereby document that I have treated the above patient, and all reasonable  conservative treatments have failed to control their disease, which causes  significant pain and negatively influences their function and now requires TKA.  The indication is advanced joint disease as demonstrated by X-ray and one or  more of the following conservative treatments is contraindicated or have been  tried and failed for three (3) months or more: anti-inflammatory medicine,  analgesic, home exercise, physical therapy and cortisone shots(s). I also  certify that the patient does not have any of the following contraindications:  active infection of the knee joint or active systemic bacteremia.    Procedure Note    Date: 05/26/2016    Pre-op Diagnosis: LEFT KNEE OA    Post-op Diagnosis: same    Surgeon(s):  Fawn Kirk., MD    Staff: Primary Circulator: Joya Martyr., RN; Shelby Dubin, RN  Physician Assistant: Sandrea Hammond D., PA  Primary Scrub Person: Letitia Libra., RN; Collene Leyden., RN  Surgical Assistant: Gwenyth Bender    Findings: Same as Pre-op Diagnosis.    Procedure(s):  Procedure(s) with comments:  TOTAL KNEE REPLACEMENT - LEFT TOTAL KNEE REPLACEMENT   Removal of ACL  intereference screw    Estimated Blood Loss: less than 100 ml    Specimens:  ID Type Source Tests Collected by Time Destination  1 : UA  URINE Foley Urine URINALYSIS W/ REFLEX TO MICROSCOPIC Joya Martyr.,  RN 05/26/2016 [phone]  A : Left knee bone debridment  BONE Bone PATH - SURGICAL PATHOLOGY Fawn Kirk., MD 05/26/2016 1007      Findings: Severe osteoarthritis knee.    Procedure Details    Due to the complexity of this case, the physician assistant  Zac ranly  was necessary for assistance in exposure, soft tissue retraction, and closure.    The patient was seen in the Holding Room. The risks, benefits, complications,  treatment options, and expected outcomes were  discussed with the patient. The  risks and potential complications of their problem and purposed treatment  include but are not limited to infection, bleeding, pain, stiffness, nerve and  vessel injury and complication secondary to the anesthetic. The patient  concurred with the proposed plan, giving informed consent.  The site of surgery  properly noted/marked. The patient was  identified as Wanda Rowe and the  procedure verified as leftTotal Knee Arthroplasty. A Time Out was held and the  above information confirmed.    The patient was brought to the operating room in supine position. After  induction of anesthesia, the proximal thigh tourniquet was applied on the  surgical knee. Leg was prepped and draped into a sterile field in the usual  fashion. Leg was elevated, exsanguinated, tourniquet inflated to 350 mmHg. Knee  was approached through midline incision with a medial parapatellar arthrotomy  approach. Menisci and cruciates were excised. Intramedullary guide was used for  the distal femoral cut. Anterior, posterior and chamfer cuts were made. m  The  interference ACL screw was identified and removed together with the box cut   Extramedullary guide was used for the proximal tibial cut. Trial components  showed the knee to come to full extension and flexion with good correction of  the deformity, alignment, stability and patellofemoral tracking. The patella was  then resurfaced. Following this, final components were then seated into place  and cemented. Once the cement  cured, irrigation and hemostasis was then done.  Wound then closed in layers and the patient brought to recovery room in stable  condition.    Instrument, sponge, and needle counts were correct prior to wound closure and at  the conclusion of the case.    The physician assistant was present during the entire procedure and was integral  to performing all aspects of the case.    Implants:   Zimmer Persona Knee    Femur size: 8 narrow    Tibia  size: E    Polyethylene Liner size: 10 mm post stabilized    Patella size: 32 mm    Complications:  None; patient tolerated the procedure well.    Disposition: PACU - hemodynamically stable.    Condition: stable

## 2016-05-26 NOTE — Unmapped (Signed)
05/26/2016   LOS: 0 days    Reason for consult: Hypothyroidism    Consult requested by: Fawn Kirk., MD    HPI:  Wanda Rowe is a(n) 56 y.o. female with a past medical history of  hypothyroidism who presents for a total knee replacement.  Currently, patient  denies any complaints.  Her post-op pain appears well controlled.  Patient had  some supplemental oxygen placed to maintain saturations.  Patient denies any  shortness of breath, chest pain, nausea, vomiting, fever, or chills.  Full 12  point ROS reviewed with no abnormalities noted.    Past Medical History  Past Medical History:  Diagnosis Date  ?????? Anemia   in past  ?????? Anxiety  ?????? Arthritis   knees  ?????? Asthma  ?????? Awareness under anesthesia   woke up during endoscopy  ?????? Back problem   occ back ache  ?????? Cancer (CMS HCC)   right breast- chemo. and radiation in 2014 Lumpectomy  ?????? Chest pain   when under stress-testing done & no cause of chest pain was found  ?????? Complication of anesthesia   rxn to succinylcholine-day after surgery, pt  had difficulty w/ muscle  movement-dr j collins in anesthesia dept notified  ?????? Dysplasia  ?????? GERD (gastroesophageal reflux disease)   no meds  ?????? Hernia   hiatal-resolved with wt loss surg  ?????? Hypertension   in past & no longer taking medication  ?????? Irregular heart beat   MVP & heart murmur-used to take pre-dental antibiotics  ?????? Liver disorder   pt told she has a fatty liver-resolved per pt  ?????? Occlusive thrombus   dvts in past  ?????? Osteopenia  ?????? RSD (reflex sympathetic dystrophy)  ?????? Thyroid disorder   hypothyroidism  ?????? Venous thrombosis and embolism      Past Surgical History  Past Surgical History:  Procedure Laterality Date  ?????? HX BREAST BIOPSY  july 2014   right  ?????? HX BREAST LUMPECTOMY  02-2013   right; also bilat reduction; back to surg next day for post op bleeding  ?????? HX COLPOSCOPY  ?????? HX FOOT SURGERY   right  ?????? HX GUM SURGERY   done for receding gums  ?????? HX HYSTERECTOMY, TOTAL  ABDOMINAL  ?????? HX KNEE SURGERY   left x 3, right x 1  ?????? HX LAPAROSCOPY  ?????? HX OTHER SURGICAL HISTORY  jan 2014   gastric sleeve  ?????? HX OTHER SURGICAL HISTORY   ins. portacath  ?????? HX UPPER GI ENDOSCOPY   mult in past due to stomach polyps  ?????? HX VAGINAL DELIVERY  ?????? HX VASCULAR SURGERY  05/22/2016   IVC filter  ?????? REMOVAL OF OVARY/TUBE(S)      Home Medications  Prescriptions Prior to Admission  Medication Sig Dispense Refill Last Dose  ?????? albuterol (PROVENTIL HFA) 90 mcg/actuation HFA Aerosol Inhaler Take 2 Puffs by  inhalation every 6 hours as needed.   > Month at Unknown time  ?????? bisoprolol-hydrochlorothiazide (ZIAC) 2.5-6.25 mg per tablet Take 0.5 Tabs by  mouth daily. 1/2 tab daily    05/25/2016 at 0800  ?????? CALCIUM ACETATE PO Take  by mouth.   05/25/2016 at Unknown time  ?????? Cranberry 400 mg Capsule Take  by mouth.   05/19/2016 at Unknown time  ?????? DULoxetine (CYMBALTA) 60 mg Capsule, Delayed Release(E.C.) Take 60 mg by mouth  daily. Reported on 07/24/2015   05/26/2016 at Unknown time  ?????? enoxaparin (LOVENOX) 100 mg/mL injection 1 mg/kg  by Subcutaneous route every  12 hours. Twice daily   04/18/2016  ?????? ergocalciferol (ERGOCALCIFEROL) 50,000 unit Capsule Take 2,000 Units by mouth  daily. Last dose 01/30/13   05/25/2016 at Unknown time  ?????? estrogens, conjugated, (PREMARIN) 0.625 mg/gram Cream Insert into vagina twice  a week. 42.5 g 1 > Month at Unknown time  ?????? fluticasone (FLONASE) 50 mcg/Actuation NA nasal spray Spray 2 Sprays into nose  daily. Each nostril   05/26/2016 at Unknown time  ?????? gabapentin (NEURONTIN) 600 mg tablet Take 600 mg by mouth 3 times daily.  05/26/2016 at Unknown time  ?????? ipratropium (ATROVENT) 0.03 % NA nasal spray Spray 2 Sprays into nose 2 times  daily. Each nostril    05/26/2016 at Unknown time  ?????? lactobacillus rhamnosus (CULTURELLE) 10 billion cell Take 1 Cap by mouth  daily.   05/25/2016 at Unknown time  ?????? levothyroxine (SYNTHROID) 75 mcg PO tablet Take 75 mcg by mouth  daily.  05/26/2016 at Unknown time      Medication Allergies  Allergies  Allergen Reactions  ?????? Azithromycin    **DOES NOT WORK**  ?????? Silver Rash  ?????? Succinylcholine Other (See Comments)    Difficulty w/ muscle movement the day after surgery  ?????? Sulfa (Sulfonamide Antibiotics) Swelling and Rash  ?????? Tegaderm Rash      Social History  Social History    Social History  ?????? Marital status: Single    Spouse name: N/A  ?????? Number of children: N/A  ?????? Years of education: N/A    Occupational History  ?????? Not on file.    Social History Main Topics  ?????? Smoking status: Never Smoker  ?????? Smokeless tobacco: Never Used  ?????? Alcohol use Yes     Comment: very occasionally  ?????? Drug use: No  ?????? Sexual activity: Yes    Partners: Male    Other Topics Concern  ?????? Not on file    Social History Narrative  ?????? No narrative on file      Family History  Family History  Problem Relation Age of Onset  ?????? High Blood Pressure Mother  ?????? Diabetes Father  ?????? High Blood Pressure Father  ?????? Heart Problems Paternal Grandfather    mi  ?????? Cancer Other    unspecified grandmother  ?????? Diabetes Other    unspecified grandmother  ?????? Stroke Other    unspecified grandmother  ?????? Colon Cancer Maternal Grandfather 60  ?????? Cancer Maternal Grandmother 60    Bladder  ?????? Anesthesia Complications Neg Hx      Full 12 point review of systems performed and is negative except as noted in  history of present illness.    Physical Exam:  Blood pressure 113/60, pulse 70, temperature 97.9 ??????F (36.6 ??????C), temperature  source Oral, resp. rate 14, height 5' 8 (1.727 m), weight 214 lb 1.1 oz (97.1  kg), last menstrual period 04/12/2008, SpO2 100 %, not currently breastfeeding.  GEN:  Comfortable and in no apparent distress  HEENT: Sclera non-icteric; conjunctivae clear  Neck:  Supple, no jugular venous distention/lymphadenopathy/ bruits  Chest:  Clear to auscultation; no dullness to percussion  CVS:  Regular rate and rhythm without murmurs,rubs or gallops  AB:  Soft, nontendert/nondistended, no hepatasplenomegaly,masses or bruits  EXT: No clubbing, cyanosis or edema, 2+ pulses  PSYCH: Alert and oriented  Skin:  Warm and dry; no rashes    Lab Results  Component Value Date   WBC 5.8 05/22/2016   HGB 13.1 05/22/2016  HCT 37.0 05/22/2016   HCT 33 10/08/2012   MCV 87.5 05/22/2016   PLT 204 05/22/2016    Lab Results  Component Value Date   CREATININE 0.69 05/22/2016   BUN 17 05/22/2016   NA 140 05/22/2016   K 4.3 05/22/2016   CL 100 10/08/2012   CO2 29 05/22/2016      Problem List:  Active Problems:    Arthritis of left knee    Localized primary osteoarthritis of lower leg, left      Assessment:  1. Post-op hypoxia  2. Hypothyroidism  3. S/P TKA    Plan:  1. Post-op hypoxia   -Due to hypoventilation from anesthesia   -Wean as tolerated.   -IS encouraged.    2. Hypothyroidism   -Continue replacement.    3. S/P TKA   -Continue critical pathway.   -DVT prophylaxis per protocol.   -PT/OT evaluations per protocol.   -Monitor for significant orthostatic hypotension with PT/OT.   -Parenteral narcotics as needed.  Monitor for adverse reactions such as  alteration mental status, hemodynamic issues and adjust if necessary.  Ortho  will make any adjustments for pain itself.      Signed:  Stephanie Coup. Young, MD  05/26/2016    [facility]  [address], [place] [address]  [phone]

## 2016-05-27 NOTE — Unmapped (Signed)
Inpatient Occupational Therapy Consult Note    Recommendations  Assessment  Assessment Complete?: Yes  Goal Formulation: Pt/family goal (specify) (back to riding horses, decreased  pain with daily activities )  No Skilled OT: No Acute OT Goals Identified;Safe To Return Home    Plan  OT Frequency: One Time Visit    Recommendations  Recommendations: 24 hour supervision  Equipment: Pt owns needed occupational therapy DME      Assessment  Precautions  Activity Level: Ambulate/Up with Assist  Weight Bearing Status: Weight bearing as tolerated  Joint Precautions: None  Isolation: None  Cardiac: None  Other Precautions: S/P L TKA 05/26/16    Home Living  Type of Home: House  Home Layout: Two Level  Entrance: Stairs  # of stairs to enter: 3  Railings Present at Entrance: Right  Total # of Floors: 2 Floors  Total # of Stairs: 12  Railings Present: Right;Left (2 Handrails for 6 steps then one on R for 6 steps)  Bathroom Shower/Tub: Pension scheme manager: Raised with DME  Bathroom Equipment: Shower Chair;Elevated toilet seat (RTS with arms )  Home Equipment: Hydrologist (RTS with arms )  Additional Comments: Pt's aunt staying with her for 10 days able to provide 24  hour supervision    Prior Function  Level of Independence: Independent with functional mobility without  AD;Independent with ADLs;Independent Homemaking  Lives With: Daughter (Lives with 88 year old daughter)  Receives Help From: Family  Homemaking Responsibilities: Meal Prep;Laundry  Vocational: Full Time Employment (works at [employer] )  Leisure: Pt enjoys horseback riding, tennis, and walking    ADL  Where Assessed: Chair  Eating Assistance:  (set-up )  Grooming Assistance: Supervision  Bathing Assist: Supervision  Bathing Deficit: Setup;Supervision/Safety  UE Dressing Assist: Supervision  UE Dressing Deficit: Setup  LE Dressing Assist: Minimal  LE Dressing Deficit:  Setup;Supervision/safety;Don/doff L sock  Toileting Assist: Supervision  Toileting Deficit: Supervison/Safety    Bed Mobility  Sit to Stand: Supervision  Bed to Chair: Supervision;Via Ambulation (RW)    Functional Transfers  Toilet Transfers: Supervision;Via ambulation  Shower Transfers:  (demo'd t/f over shower lip, pt verbalized understanding )  Car Transfers: Supervision;Via Ambulation    Cognition  Overall Cognitive Status: Does not interfere with functional mobility or ADL  Arousal/Alertness: Alert  Behavior: Cooperative;Pleasant;Appropriate  Attention Span: Appears intact  Orientation: Person;Place;Time;Situation  Following Commands: Follows multi-step commands w/out difficulty  Initiation/Sequencing/Organization: Appears intact  Safety Judgement: Good awareness of safety precautions  Problem Solving: Able to problem solve independently    Perseveration  Perseveration: Not present    Praxis/Motor Planning  Praxis/ Motor Planning: Functional for ADL    Coordination  Coordination: WDL  Coordination comments: Functional    Light Touch  RUE: No apparent deficit  LUE: No apparent deficit      Proprioception  RUE: No apparent deficit  LUE: No apparent deficit    Balance  Sitting-Static: Good - Maintains balance with moderate challenges from all  directions  Sitting-Dynamic: Good - Maintains balance through moderate excursions of active  trunk movement  Standing-Static: Fair+ - Maintains balance with minimal challenges from all  directions  Standing-Dynamic: Fair+ - Maintains balance through minimal excursions of active  truck movement    RUE Assessment  RUE Assessment: ROM and Strength WFL    LUE Assessment  LUE Assessment: ROM and Strength WFL    Hand Function  Gross Grasp: Functional  Hand Coordination: [facility]  Pain Information  Mild pain with mobility, able to continue. Ice pack applied end of session.    Recommendations  Assessment  Assessment Complete?: Yes  Goal Formulation: Pt/family goal (specify) (back to  riding horses, decreased  pain with daily activities )  No Skilled OT: No Acute OT Goals Identified;Safe To Return Home    Plan  OT Frequency: One Time Visit    Recommendations  Recommendations: 24 hour supervision  Equipment: Pt owns needed occupational therapy DME    No acute OT goals identified. DC acute OT.    Occupational Therapy Evaluation Charge Reference  Occupational Profile & Medical History:  Refer to assessment section above including precautions, home living, and prior  function.  Brief history Low x  Expanded review Mod  Extensive review High  Assessments of Occupational Performance:  Refer to assessment section above including ADL, bed mobility, functional  transfers, vision & hearing, visual perceptual assessment, cognition,  perseveration, motor planning, coordination, touch, proprioception, balance,  ROM/MMT and hand function.  1-3 performance deficits Low x  3-5 performance deficits Mod  5 or more performance deficits High  Clinical Decision Making:  Refer to recommendations section above including assessment, plan, and  recommendations.  Low analytical complexity, limited treatment options, no assessment  modifications, no co-morbidities Low x  Moderate analytical complexity, min-mod assessment modifications, mod treatment  options, may have co-morbidities Mod  High analytical complexity, comprehensive assessments, multiple treatment  options, significant modifications of assessments, co-morbidities that affect  performance High    Charge: 1 low eval (20 min)      Therapist: Judithann Sauger, OT  Date: 05/27/2016

## 2016-05-27 NOTE — Unmapped (Signed)
Subjective  Subjective:    Post-Operative Day: 1 Status Post left Total Knee Arthroplasty  Systemic or Specific Complaints: OrthoMix used intraoperatively. Patient is  doing well this AM, Pain is well controlled. Discussed Anticoagulation and DVT  prophylaxis. Will take Lovenox 40 mg today, and start 100 mg BID tomorrow.  Patient states she already has 100 mg doses at home, prescribed by PCP.  Demonstrates understanding of post op dressing and care. Patient denies CP, SOB,  Nausea, HA. Plans for outpt PT close to home.        Objective:    Patient Vitals for the past 24 hrs:   BP Temp Temp src Pulse Resp SpO2 Height Weight  05/27/16 0342 125/66 97.9 ??????F (36.6 ??????C) Oral 61 16 100 % - -  05/26/16 2319 111/63 97.9 ??????F (36.6 ??????C) Oral 58 16 99 % - -  05/26/16 1917 99/42 98 ??????F (36.7 ??????C) Oral 71 16 99 % - -  05/26/16 1518 105/52 97.9 ??????F (36.6 ??????C) Oral 68 16 99 % - -  05/26/16 1518 105/52 97.9 ??????F (36.6 ??????C) Oral 68 16 99 % - -  05/26/16 1217 113/60 97.9 ??????F (36.6 ??????C) Oral 70 14 100 % - -  05/26/16 1200 124/62 97 ??????F (36.1 ??????C) Temporal 71 14 100 % - -  05/26/16 1145 126/69 97.2 ??????F (36.2 ??????C) Temporal 67 12 100 % - -  05/26/16 1130 114/67 - - 68 12 100 % - -  05/26/16 1115 117/63 - - 71 13 100 % - -  05/26/16 1100 115/64 97 ??????F (36.1 ??????C) Temporal 88 20 100 % - -  05/26/16 0928 - - - 65 16 94 % - -  05/26/16 0923 - - - 64 16 94 % - -  05/26/16 0918 - - - 68 16 100 % - -  05/26/16 0913 - - - 62 16 100 % - -  05/26/16 0908 - - - 63 16 100 % - -  05/26/16 0903 - - - 60 16 99 % - -  05/26/16 0807 111/61 97.8 ??????F (36.6 ??????C) Oral 57 16 99 % - -  05/26/16 0758 - - - - - - 5' 8 (1.727 m) 214 lb 1.1 oz (97.1 kg)      General: alert, cooperative, no distress, appears stated age  Wound: No Erythema, No Edema and No Drainage  Motion: NVI, Plantar/dorsiflexion = bilaterally.  DVT Exam: No evidence of DVT seen on physical exam.  No cords or calf tenderness.    Data Review  CBC:  Lab Results  Component Value  Date   WBC 5.8 05/22/2016   RBC 4.23 05/22/2016   HGB 11.7 05/27/2016   HCT 33.4 (LOW) 05/27/2016   HCT 33 10/08/2012   PLT 204 05/22/2016      Assessment:    Status Post left Total Knee Arthroplasty.  Hx of DVT  Hypothyroidism    Plan:    Continues current post-op course  PT/OT -- WBAT  Encourage IS/ankle pumps  Push oral fluid intake  Lovenox for DVT prophylaxis, SCD, TED  Change dressing today  D/c planning: Home when therapy goals are met.    F/U with Dr. Jaynie Collins in 4 weeks.  Time spent reviewing discharge instructions with patient as well as therapy  expectations >31 min.

## 2016-05-27 NOTE — Unmapped (Signed)
Inpatient Physical Therapy Consult Note    Recommendations  Assessment  Assessment Complete?: Yes  Deficits: Decreased LE ROM;Decreased LE Strength;Impaired gait  Rehab Potential: Good  Patient Strengths: Motivation;Family support;Supportive discharge  environment;Home set-up;Prior level of function  Barriers to Goal Achievement: Decreased ROM;Decreased strength  Goal Formulation: Pt/family goal (specify) (Get back to walking, hiking, and  tennis,)    Plan  Treatment Interventions: Art gallery manager;Therapeutic  exercises;Endurance Training;Balance;Patient/Family Training;Equipment  Eval/Education;Gait Training;Neuromuscular reeducation  PT Frequency: per critical pathway    Recommendations  Recommendations: 24 hour supervision;Outpatient PT  Equipment Recommended: Pt owns recommended PT DME      Assessment  Precautions  Activity Level: Ambulate/Up with Assist  Weight Bearing Status: Weight bearing as tolerated  Joint Precautions: None  Isolation: None  Cardiac: None  Other Precautions: S/P L TKA 05/26/16    Home Living  Type of Home: House  Home Layout: Two Level  Entrance: Stairs  # of stairs to enter: 3  Railings Present at Entrance: Right  Total # of Floors: 2 Floors  Total # of Stairs: 12  Railings Present: Right;Left (2 Handrails for 6 steps then one on R for 6 steps)  Home Equipment: Crutches;Corrective lenses;Rolling Walker (reading glasses. )  Additional Comments: Pt's aunt staying with her for 10 days able to provide 24  hour supervision    Prior Function  Level of Independence: Independent with functional mobility without  AD;Independent with ADLs  Lives With: Daughter (Lives with 5 year old daughter)  Receives Help From: Family  Vocational: Full Time Employment (works at [employer] )  Leisure: Pt enjoys horseback riding, tennis, and walking    [employer] Mobility  Rolling: Supervision  Supine to Sit: Supervision  Sit to Supine: Supervision  Sit to Stand: Development worker, community (specify)  (RW)    Engineer, drilling: Not applicable, patient is ambulatory    Gait  Pattern: Decreased Cadence;Step-to;Step-through;Decreased stance time  L;Decreased step length R  Gait Assistance: CGA;SBA (first 100' CGA progressed to SBA 50' )  Assistive Device: Radiation protection practitioner (ft): 150 Feet  Stair management technique: Two rails;One rail R (8 steps with 2 rails; 4 steps  one rail on R )  Stair management assistance: CGA  # of Stairs: 12 (4+4+4)    Balance  Sitting-Static: Fair+ - Maintains balance with minimal challenges from all  directions  Sitting-Dynamic: Fair+ - Maintains balance through minimal excursions of active  truck movement  Standing-Static: Fair - Able to stand unsupported without balance loss or UE  support  Standing-Dynamic: Fair - Maintains balance through minimal excursions of active  trunk movement with supervision    Coordination  Coordination: No gross deficits noted  Coordination comments: Functional    Activity Tolerance  Functional Activity Tolerance: Endurance does not limit participation        Cognition  Overall Cognitive Status: Does not interfere with functional mobility or ADL  Arousal/Alertness: Alert  Behavior: Cooperative;Appropriate;Pleasant  Attention Span: Appears intact  Orientation: Person;Place;Time;Situation  Following Commands: Follows one step commands w/out difficulty  Initiation/Sequencing/Organization: Appears intact  Safety Judgement: Good awareness of safety precautions  Problem Solving: Able to problem solve independently    Light Touch  RLE: No apparent deficit  LLE: No apparent deficit        Proprioception  RLE: No apparent deficit  LLE: No apparent deficit    Perception  Inattention/Neglect: Appears intact  Motor Planning and Motor Control: WDL for functional mobility  RLE Assessment  RLE Assessment: ROM and Strength WFL    LLE Assessment  LLE Assessment: ROM and Strength WFL (functional for gait and stairs)    Pain  Information          Recommendations  Assessment  Assessment Complete?: Yes  Deficits: Decreased LE ROM;Decreased LE Strength;Impaired gait  Rehab Potential: Good  Patient Strengths: Motivation;Family support;Supportive discharge  environment;Home set-up;Prior level of function  Barriers to Goal Achievement: Decreased ROM;Decreased strength  Goal Formulation: Pt/family goal (specify) (Get back to walking, hiking, and  tennis,)    Plan  Treatment Interventions: Art gallery manager;Therapeutic  exercises;Endurance Training;Balance;Patient/Family Training;Equipment  Eval/Education;Gait Training;Neuromuscular reeducation  PT Frequency: per critical pathway    Recommendations  Recommendations: 24 hour supervision;Outpatient PT  Equipment Recommended: Pt owns recommended PT DME    Pt goals to be met by 05/28/16  1. Pt will perform sit to/from stand transfers with supervision.  2. Pt will ambulate 150' with RW and supervision.  3. Pt will ascend and descend 12 steps with no hand rail for 6 steps and  bilateral handrails for 6 steps and SBA.  4. Pt will tolerate 15 reps of LE therapeutic exercise per critical pathway.    P: Continue Acute PT per critical pathway. PT recommends home with 24 hour  supervision (aunt able to provide) and outpatient PT. Pt owns DME needed (RW).    Time: 40 min  Charge: new; eval low complexity    Physical Therapy Evaluation Charge Reference    History ?????? refer to precautions, prior level of function, home set up and  cognition sections above  No personal factors and/or comorbidities Low x  1-2 personal factors and/or comorbidities Moderate  3 or more personal factors and/or comorbidities High  Examination of body systems ?????? refer to bed mobility, gait, wheelchair mobility,  balance, coordination, vision/hearing and LE assessment (strength, light touch  and proprioception) sections above  Of body system(s) using standardized tests and measures addressing 1-2 elements  from any of the  following: body structures and functions, activity limitations,  and/or participation restrictions Low x  Of body system(s) using standardized tests and measures addressing 3 or more  elements from any of the following: body structures and functions, activity  limitations, and/or participation restrictions Moderate  Of body system(s) using standardized tests and measures addressing 4 or more  elements from any of the following: body structures and functions, activity  limitations, and/or participation restrictions High  Clinical Presentation - refer to assessment, plan and recommendations sections  above  With stable and/or uncomplicated characteristics Low x  Evolving clinical presentation with changing characteristics Moderate  Unstable and unpredictable characteristics High  Clinical Decision making ?????? refer to assessment, plan and recommendations  sections above  Low complexity using standardized patient assessment instrument and/or  measurable assessment of functional outcome Low x  Moderate complexity using standardized patient assessment instrument and/or  measurable assessment of functional outcome Moderate  High complexity using standardized patient assessment instrument and/or  measurable assessment of functional outcome High          Therapist: Pincus Sanes. Freida Busman, PT  Date: 05/27/2016

## 2016-05-27 NOTE — Unmapped (Signed)
Admit Date: 05/26/2016      Subjective:    CC: F/U TKA    Interval history:  Patient denies new complaints.    ROS: denies fever; denies nausea    Objective:    Physical Exam:  Temp: 97.9 ??????F (36.6 ??????C)  Heart Rate: 58  Blood Pressure (cuff): 132/56  SpO2: 99 %    Gen:  alert, well appearing, and in no distress  HEENT: pupils equal and reactive, extraocular eye movements intact  Neck:   supple, no significant adenopathy  Cardio:  normal rate, regular rhythm, normal S1, S2, no murmurs, rubs, clicks or  gallops  Resp:  clear to auscultation, no wheezes, rales or rhonchi, symmetric air entry.  Abd:   soft, nontender, nondistended, no masses or organomegaly.  Ext:   peripheral pulses normal, no pedal edema, no clubbing or cyanosis  MS:  no joint tenderness, deformity or swelling  Neuro:  alert, oriented, normal speech, no focal findings or movement disorder  noted    Labs and Studies    Interval labs have been reviewed    CBC  Lab Results  Component Value Date   WBC 5.8 05/22/2016   HGB 11.7 05/27/2016   HCT 33.4 (LOW) 05/27/2016   HCT 33 10/08/2012   MCV 87.8 05/27/2016   PLT 204 05/22/2016      Renal  Lab Results  Component Value Date   NA 140 05/22/2016   K 4.3 05/22/2016   CL 100 10/08/2012   CO2 29 05/22/2016   BUN 17 05/22/2016   CREATININE 0.69 05/22/2016   GLU 109 (HIGH) 05/22/2016      Assessment:    Active Problems:    Arthritis of left knee    Localized primary osteoarthritis of lower leg, left      1. Post-op hypoxia  2. Hypothyroidism  3. S/P TKA    Plan:    1. Post-op hypoxia   -Resolved.    2. Hypothyroidism   -Continue replacement.    3. S/P TKA   -PT/OT   -Continue pathway.    Signed:  Stephanie Coup. Young, MD  05/27/2016    [facility]  [address], [place] [address]  [phone]

## 2016-05-27 NOTE — Unmapped (Signed)
Inpatient Physical Therapy Progress Note  S: I can't believe how good I am feeling. I am waiting for the pain to really  start.  O:  Precautions  Activity Level: Ambulate/Up with Assist  Weight Bearing Status: Weight bearing as tolerated  Joint Precautions: None  Isolation: None  Cardiac: None  Other Precautions: S/P L TKA 05/26/16    Cognition: Appropriate judgement;Appropriate safety awareness;Follows commands    Bed Mobility  Sit to Stand: Supervision;With Assistive Device (specify) (RW)    Wheelchair Mobility  Wheelchair: Not applicable, patient is ambulatory    Gait  Pattern: Decreased Cadence;Step-through;Decreased stance time L (slight  decreased stance time left)  Gait Assistance: Supervision  Assistive Device: Rolling walker  Distance (ft): 300 Feet  Stair management technique: Two rails;One rail R (6 steps with 2 rails; 6 steps  with one rail on R )  Stair management assistance: Supervision  # of Stairs: 12        Coordination  Coordination: No gross deficits noted  Coordination comments: Functional        Interventions/Exercises  TKA Critical Path Exercises: Isometrics;Supine;sitting  TKA Exercise Reps: 15  Pt/family education: Provided pt and her aunt with stair training and therex for  home.  HEP Issued: Yes  Functional Activity Tolerance: Endurance does not limit participation        Pain Information   Pt reports slight intermittent pain on posterior knee with initial contact  during the gait cycle. Pain resolved when patient returned to seated position.        Pt goals to be met by 05/28/16  1. Pt will perform sit to/from stand transfers with supervision. (met)  2. Pt will ambulate 150' with RW and supervision.(met)  3. Pt will ascend and descend 12 steps with no hand rail for 6 steps and  bilateral handrails for 6 steps and SBA. (met)  4. Pt will tolerate 15 reps of LE therapeutic exercise per critical  pathway.(met)     A: Pt able to meet 4/4 functional goals. Pt performed all functional  mobility  safely and with supervision. Her aunt is able to provide 24 hour supervision  upon discharge. Provided pt with HEP.  P: D/C Acute PT. PT recommends home with 24 hour supervision (aunt able to  provide) and outpatient PT. Pt owns DME needed (RW).     Time:  Charge: established; 15 min 1 unit TW, 15 min 1 unit Gait        Therapist: Pincus Sanes. Freida Busman, PT  Date: 05/27/2016

## 2016-05-29 NOTE — Unmapped (Signed)
Discharge Summary    Patient ID:  Wanda Rowe  MRN: [id number]  CSN: [id number]  56 y.o.  DOB: 1961-01-02    Admit date: 05/26/2016    Discharge date: 05/27/2016    Admitting Physician: Wanda Coombes. Jaynie Collins, MD    Consults:  Hospitalist    Admission Diagnoses:    Osteoarthritis knee    Past Medical History:  Diagnosis Date  ?????? Anemia   in past  ?????? Anxiety  ?????? Arthritis   knees  ?????? Asthma  ?????? Awareness under anesthesia   woke up during endoscopy  ?????? Back problem   occ back ache  ?????? Cancer (CMS HCC)   right breast- chemo. and radiation in 2014 Lumpectomy  ?????? Chest pain   when under stress-testing done & no cause of chest pain was found  ?????? Complication of anesthesia   rxn to succinylcholine-day after surgery, pt  had difficulty w/ muscle  movement-dr j Rowe in anesthesia dept notified  ?????? Dysplasia  ?????? GERD (gastroesophageal reflux disease)   no meds  ?????? Hernia   hiatal-resolved with wt loss surg  ?????? Hypertension   in past & no longer taking medication  ?????? Irregular heart beat   MVP & heart murmur-used to take pre-dental antibiotics  ?????? Liver disorder   pt told she has a fatty liver-resolved per pt  ?????? Occlusive thrombus   dvts in past  ?????? Osteopenia  ?????? RSD (reflex sympathetic dystrophy)  ?????? Thyroid disorder   hypothyroidism  ?????? Venous thrombosis and embolism      Discharge Diagnoses:    Osteoarthritis knee  HX of DVT  Hypothyroidism    Past Medical History:  Diagnosis Date  ?????? Anemia   in past  ?????? Anxiety  ?????? Arthritis   knees  ?????? Asthma  ?????? Awareness under anesthesia   woke up during endoscopy  ?????? Back problem   occ back ache  ?????? Cancer (CMS HCC)   right breast- chemo. and radiation in 2014 Lumpectomy  ?????? Chest pain   when under stress-testing done & no cause of chest pain was found  ?????? Complication of anesthesia   rxn to succinylcholine-day after surgery, pt  had difficulty w/ muscle  movement-dr j Rowe in anesthesia dept notified  ?????? Dysplasia  ?????? GERD (gastroesophageal reflux  disease)   no meds  ?????? Hernia   hiatal-resolved with wt loss surg  ?????? Hypertension   in past & no longer taking medication  ?????? Irregular heart beat   MVP & heart murmur-used to take pre-dental antibiotics  ?????? Liver disorder   pt told she has a fatty liver-resolved per pt  ?????? Occlusive thrombus   dvts in past  ?????? Osteopenia  ?????? RSD (reflex sympathetic dystrophy)  ?????? Thyroid disorder   hypothyroidism  ?????? Venous thrombosis and embolism      Procedures performed: left total knee arthroplasty    Hospital Course:  The patient was given regional nerve blocks preoperatively, ortho mix injection  intraoperatively and admitted for postsurgical care.  The patient was placed on  the total knee critical pathway. Physical and occupational therapy were  consulted for evaluation. The hospitalist was consulted for post op medical  management.  The patient was placed on Lovenox, Ted hose and SCDs for DVT  prophylaxis. The patient did well with physical therapy and is being discharged  in stable condition.    Condition on discharge:  Stable    Disposition: home    Discharge Instructions:  PT eval and treat.  Weightbearing as tolerated.  Ted hose for 2 weeks or until swelling comes down.  May remove dressing at 12-14 days post op and leave open to air.  No staples present.  F/u with Dr. Nadyne Coombes. Lim, MD in 4 weeks.    Patient will be discharged on opiods in excess of daily limit and supply.  This  is due to major orthopedic surgical intervention and the demand given inpatient  to achieve optimal pain control.  Patient was advised of the benefits and risks  of opoid analgesia including the potential for addiction.  The dose exceeds 30  MED average limit due to major orthopedic surgery.    Discharge Medications:   Wanda Rowe, Wanda Rowe  Home Medication Instructions [id number]   Printed on:05/29/16 0746  Medication Information    albuterol (PROVENTIL HFA) 90 mcg/actuation HFA Aerosol Inhaler  Take 2 Puffs by inhalation every 6  hours as needed.    bisoprolol-hydrochlorothiazide (ZIAC) 2.5-6.25 mg per tablet  Take 0.5 Tabs by mouth daily. 1/2 tab daily    CALCIUM ACETATE PO  Take  by mouth.    Cranberry 400 mg Capsule  Take  by mouth.    DULoxetine (CYMBALTA) 60 mg Capsule, Delayed Release(E.C.)  Take 60 mg by mouth daily. Reported on 07/24/2015    enoxaparin (LOVENOX) 100 mg/mL injection  1 mg/kg by Subcutaneous route every 12 hours. Twice daily    ergocalciferol (ERGOCALCIFEROL) 50,000 unit Capsule  Take 2,000 Units by mouth daily. Last dose 01/30/13    estrogens, conjugated, (PREMARIN) 0.625 mg/gram Cream  Insert into vagina twice a week.    fluticasone (FLONASE) 50 mcg/Actuation NA nasal spray  Spray 2 Sprays into nose daily. Each nostril    gabapentin (NEURONTIN) 600 mg tablet  Take 600 mg by mouth 3 times daily.    ipratropium (ATROVENT) 0.03 % NA nasal spray  Spray 2 Sprays into nose 2 times daily. Each nostril    lactobacillus rhamnosus (CULTURELLE) 10 billion cell  Take 1 Cap by mouth daily.    levothyroxine (SYNTHROID) 75 mcg PO tablet  Take 75 mcg by mouth daily.    oxyCODONE-acetaminophen (PERCOCET) 5-325 mg per tablet  Take 1-2 Tabs by mouth every 4 hours as needed for up to 7 days.        Signed:  Delynn Rowe. [place], [place]  05/29/2016  7:46 AM

## 2016-07-01 ENCOUNTER — Ambulatory Visit: Admit: 2016-07-01 | Discharge: 2016-07-01 | Payer: PRIVATE HEALTH INSURANCE

## 2016-07-01 ENCOUNTER — Ambulatory Visit: Admit: 2016-07-01 | Payer: PRIVATE HEALTH INSURANCE

## 2016-07-01 DIAGNOSIS — Z01818 Encounter for other preprocedural examination: Secondary | ICD-10-CM

## 2016-07-01 DIAGNOSIS — Z9189 Other specified personal risk factors, not elsewhere classified: Secondary | ICD-10-CM | POA: Insufficient documentation

## 2016-07-01 DIAGNOSIS — R29818 Other symptoms and signs involving the nervous system: Secondary | ICD-10-CM | POA: Insufficient documentation

## 2016-07-01 LAB — T4, FREE: Free T4: 0.97 ng/dL (ref 0.61–1.76)

## 2016-07-01 LAB — CBC
Hematocrit: 34.2 % (ref 35.0–45.0)
Hemoglobin: 11.6 g/dL (ref 11.7–15.5)
MCH: 30.1 pg (ref 27.0–33.0)
MCHC: 34 g/dL (ref 32.0–36.0)
MCV: 88.6 fL (ref 80.0–100.0)
MPV: 8.1 fL (ref 7.5–11.5)
Platelets: 376 10*3/uL (ref 140–400)
RBC: 3.86 10*6/uL (ref 3.80–5.10)
RDW: 15.1 % (ref 11.0–15.0)
WBC: 5.2 10*3/uL (ref 3.8–10.8)

## 2016-07-01 LAB — COMPREHENSIVE METABOLIC PANEL
ALT: 12 U/L (ref 7–52)
AST: 16 U/L (ref 13–39)
Albumin: 4.5 g/dL (ref 3.5–5.7)
Alkaline Phosphatase: 68 U/L (ref 36–125)
Anion Gap: 8 mmol/L (ref 3–16)
BUN: 17 mg/dL (ref 7–25)
CO2: 28 mmol/L (ref 21–33)
Calcium: 10.4 mg/dL — ABNORMAL HIGH (ref 8.6–10.3)
Chloride: 104 mmol/L (ref 98–110)
Creatinine: 0.63 mg/dL (ref 0.60–1.30)
Glucose: 97 mg/dL (ref 70–100)
Osmolality, Calculated: 291 mosm/kg (ref 278–305)
Potassium: 4.3 mmol/L (ref 3.5–5.3)
Sodium: 140 mmol/L (ref 133–146)
Total Bilirubin: 0.4 mg/dL (ref 0.0–1.5)
Total Protein: 7.3 g/dL (ref 6.4–8.9)
eGFR AA CKD-EPI: >90 See note.
eGFR NONAA CKD-EPI: >90 See note.

## 2016-07-01 LAB — TSH: TSH: 0.5 u[IU]/mL (ref 0.45–4.12)

## 2016-07-01 LAB — VITAMIN D 25 HYDROXY: Vit D, 25-Hydroxy: 55 ng/mL (ref 30.0–100)

## 2016-07-01 LAB — VITAMIN B12: Vitamin B-12: 306 pg/mL (ref 180–914)

## 2016-07-01 LAB — IRON: Iron: 66 ug/dL (ref 50–212)

## 2016-07-01 LAB — T3: T3, Total: 84.6 ng/dL (ref 60.0–220.0)

## 2016-07-01 LAB — FOLATE: Folic Acid: 7.2 ng/mL (ref 5.90–24.80)

## 2016-07-01 LAB — FERRITIN: Ferritin: 171.1 ng/mL (ref 11.0–306.8)

## 2016-07-01 MED ORDER — levothyroxine (SYNTHROID) 75 MCG tablet
75 | ORAL_TABLET | Freq: Every morning | ORAL | 0 refills | Status: AC
Start: 2016-07-01 — End: 2016-07-05

## 2016-07-01 MED ORDER — enoxaparin (LOVENOX) 100 mg/mL Syrg
100 | Freq: Two times a day (BID) | SUBCUTANEOUS | 0 refills | 20.00000 days | Status: AC
Start: 2016-07-01 — End: 2016-08-11

## 2016-07-01 NOTE — Unmapped (Signed)
neurontin

## 2016-07-01 NOTE — Unmapped (Signed)
Has IVC filter on lovenox will d/w heme stopping prior to procedure

## 2016-07-01 NOTE — Unmapped (Signed)
Check vit levels

## 2016-07-01 NOTE — Unmapped (Addendum)
Ok for planned procedure discuss with heme how long should hold lovenox before procedure. Check cbc, cmp

## 2016-07-01 NOTE — Unmapped (Signed)
D/c ziac

## 2016-07-01 NOTE — Unmapped (Signed)
Instructed pt to continue current meds

## 2016-07-01 NOTE — Unmapped (Signed)
[facility] [address]  [address]  [place] [address]    Name:  STEFFANY SCHOENFELDER Date of Birth: 10-11-1960 (56 y.o.)   MRN: [id number]    Date of Service:  07/01/2016     Subjective:     Chief Complaint   Patient presents with   ??? Pre-op Exam     Dr Jaynie Collins, 6/5, knee correction srugery   ??? Back Pain     History of Present Illness:  Demisha Nokes S Nettle is a(n) 56 y.o. female here today for the following:   Left knee manipulation under anesthesia, also needs sleep study due to possible apnea seen in recovery left TKR        Current Outpatient Prescriptions   Medication Sig Dispense Refill   ??? albuterol (PROVENTIL;VENTOLIN;PROAIR) 90 mcg/actuation inhaler Inhale 1-2 puffs into the lungs every 6 hours as needed. 1 Inhaler 3   ??? bisoprolol-hydrochlorothiazide (ZIAC) 2.5-6.25 mg per tablet Take 1 tablet by mouth daily. 30 tablet 5   ??? blood sugar diagnostic (ACCU-CHEK AVIVA PLUS TEST STRP) Strp fsbs qd 100 strip 1   ??? blood-glucose meter (GLUCOSE MONITORING KIT) kit by Other route 2 (two) times daily. Use as instructed - fsbs bid      ??? CALCIUM CITRATE ORAL Take 1 tablet by mouth daily.       ??? cholecalciferol, vitamin D3, 2,000 unit Cap Take 1 capsule by mouth daily. 1 capsule 0   ??? conjugated estrogens (PREMARIN) vaginal cream twicwe a week 42.5 g 0   ??? cranberry 400 mg Cap Take by mouth.     ??? DULoxetine (CYMBALTA) 60 MG capsule Take 1 capsule (60 mg total) by mouth daily. 30 capsule 5   ??? fluticasone (FLONASE) 50 mcg/actuation nasal spray Use 2 sprays into each nostril daily. 16 g 5   ??? gabapentin (NEURONTIN) 600 MG tablet Take 1 tablet (600 mg total) by mouth 3 times a day. 270 tablet 5   ??? ipratropium (ATROVENT) 0.03 % nasal spray Use 2 sprays into each nostril 2 times a day. 30 mL 5   ??? LACTOBACILLUS ACIDOPHILUS (PROBIOTIC ORAL) Take 1 capsule by mouth daily.     ??? levothyroxine (SYNTHROID, LEVOTHROID) 75 MCG tablet TAKE 1 TABLET(75 MCG) BY MOUTH DAILY 90 tablet 0   ??? traMADol (ULTRAM)  50 mg tablet Take 1 tab every 6 -8 hours prn     ??? valACYclovir (VALTREX) 500 MG tablet TAKE 1 TABLET BY MOUTH EVERY DAY 30 tablet 0     No current facility-administered medications for this visit.       Review of Systems   Constitutional: Negative for fatigue.   All other systems reviewed and are negative.           Objective:     Vitals:    07/01/16 1058   BP: 96/64   Pulse: 80   Resp: 12   Weight: 195 lb (88.5 kg)   Height: 5' 8 (1.727 m)     Body mass index is 29.65 kg/m??.    Physical Exam   Constitutional: She appears well-developed and well-nourished.   HENT:   Head: Normocephalic and atraumatic.   Cardiovascular: Normal rate, regular rhythm and normal heart sounds.  Exam reveals no gallop and no friction rub.    No murmur heard.  Pulmonary/Chest: Effort normal and breath sounds normal.   Abdominal: Soft. Bowel sounds are normal.   Psychiatric: Her behavior is normal.  anxious            Assessment/Plan:       Preop examination  Ok for planned procedure discuss with heme how long should hold lovenox before procedure. Check cbc, cmp    Hypotension due to drugs  D/c ziac    At risk for obstructive sleep apnea  Check sleep study    RSD lower limb  neurontin    Anxiety and depression  Instructed pt to continue current meds      H/O bariatric surgery  Check vit levels    DVT (deep venous thrombosis)  Has IVC filter on lovenox will d/w heme stopping prior to procedure    Acquired hypothyroidism  Cont synthroid recheck tsh                    Hephzibah Strehle Johnn Hai, MD

## 2016-07-01 NOTE — Unmapped (Signed)
Check sleep study

## 2016-07-01 NOTE — Unmapped (Signed)
Cont synthroid recheck tsh

## 2016-07-05 MED ORDER — ferrous sulfate 140 mg (45 mg iron) TbSR
140 | ORAL_TABLET | Freq: Every day | ORAL | 1 refills | Status: AC
Start: 2016-07-05 — End: 2016-09-22

## 2016-07-05 MED ORDER — folic acid-vit B6-vit B12 2.5-25-1 mg Tab
2.5-25-1 | Freq: Every day | ORAL | 1 refills | Status: AC
Start: 2016-07-05 — End: 2016-09-22

## 2016-07-05 MED ORDER — levothyroxine (SYNTHROID, LEVOTHROID) 50 MCG tablet
50 | ORAL_TABLET | Freq: Every morning | ORAL | 1 refills | Status: AC
Start: 2016-07-05 — End: 2017-01-14

## 2016-07-05 NOTE — Unmapped (Signed)
Notify b12/folic acid/iron little low will call in rx, thyroid dose little high would decrease dose recheck labs/appt 3 mo

## 2016-07-06 NOTE — Telephone Encounter (Signed)
PT informed

## 2016-07-07 NOTE — Unmapped (Signed)
[facility]    PATIENT NAME: Wanda Rowe, Wanda Rowe                              MR#: [id number]  DATE OF BIRTH: 09/14/1960                           Account #: [id number]  ADMITTING: Edmon Crape                               ROOM #: [facility] PO  ATTENDING: George Ina V                         NURSING UNIT: [id number]  PRIMARY: Smita Lesh K                          ADMIT DATE: 07/07/2016  REFERRING: ,                                   DISCHARGE DATE: 07/07/2016  DICTATED BY: LIM, EDWARD V.A.                               OPERATIVE REPORT    DATE OF OPERATION: 07/07/16    PREOPERATIVE DIAGNOSES:  The patient is status post left total knee replacement  with postoperative stiffness and adhesions.    PROCEDURE PERFORMED:  Left knee manipulation under general anesthesia with  injection of the left knee with 40 mg Kenalog and 4 mL of 0.25% Marcaine.    SURGEON:  George Ina, MD    ANESTHESIA:  General.    INDICATIONS FOR SURGERY:  The patient had a recent total knee replacement with  significant degree of stiffness.  We discussed with her the option of  manipulation to get her full extension and flexion.  Preoperatively, the patient  had a measurement of -10 degrees, further flexion to 70 degrees of flexion.    DESCRIPTION OF PROCEDURE:  After induction of anesthesia, the left knee was  gently manipulated into full extension and comparing to the other leg was equal  in full extension.  Gradual flexion of the knee obtained about 120 degrees of  flexion involving this left knee following the manipulation through a  superolateral portal 5 mL of fluid, which involved Kenalog and Marcaine was then  injected into the joint.  Band-Aid was applied.  Ice packs were placed on the  left knee and the patient brought to recovery room in stable condition.      Dict: *EDWARD V. LIM. MD  Auth: Si Raider LIM. MD  D: 07/07/2016 13:54:21  T: 07/07/2016 21:12:26  Orig. Job# [id number]  Dictation ID: [id number]

## 2016-07-29 NOTE — Unmapped (Addendum)
Progress Notes by Ophelia Shoulder., RMA at 07/29/16 0830     Author:  Ophelia Shoulder., RMA Service:  Orthopedics Author Type:  Medical Assistant    Filed:  07/31/16 1148 Encounter Date:  07/29/2016 Status:  Signed    Editor:  Fawn Kirk., MD (Physician)  Cosigner:  Fawn Kirk., MD at 07/31/16 1355       Subjective    Subjective:   Patient ID: Wanda Rowe  Age: 56 y.o. (DOB: 14-Jan-1961)  Ethnicity: Non-Hispanic  Race: White or Caucasian  Gender: female    Chief Complaint:  Chief Complaint     Patient presents with     ??????? Post-Op      L TKA 05/26/16; L KNEE MANIPULATION 07/07/16            HPI: She is here today status post left total knee replacement of May 26, 2016 with manipulation performed on July 07, 2016.  She states that it was difficult and it took up until last week for her to actually improve with her flexion.  She finally got   the full flexion yesterday.  She does still have pain.      Ortho Exam: Range of motion is 0-125???? with no mechanical or ligamentous instability noted.  There is mild warmth and swelling, no erythema or effusion.  She is ambulating well.      ROS:       Objective      Review of Systems  Musculoskeletal: Joint Pain, Knee Pain, Extremity Swelling  Constitutional: (-) Fever, (-) Chills  Neurological: (-) Dizziness  GI: (-) Nausea, (-) Vomiting      Patient Vitals:  Vitals  Height: 5' 8 (172.7 cm)  Weight: 194 lb (88 kg)         X-Ray/Injection In Clinic Procedure Notes         In Clinic Administered Meds Billing Data    None         Assessment/Impression:     Encounter Diagnoses      Code  Name Primary?   ??????? Z47.1, B14.782 Aftercare following left knee joint replacement surgery Yes   ??????? G89.18 Pain, acute postoperative          Plan:     Orders Placed This Encounter     ??????? traMADol (ULTRAM) 50 mg tablet      Sig: Take 1 Tab by mouth every 8 hours as needed for Pain for up to 7 days.     Dispense:  21 Tab     Refill:  0     I informed her that she has done well and  her pain will resolve.  I instructed her on pressure massage for softening any scar tissue formation and continue ice as needed.  I advised that water exercise will be very good for her and she should do them as   often as she can.  Several short exercises versus 1 long exercise.  2 flexion and abductors as well as water walking.  She has an IVC filter and my recommendation is to have it removed 6 months from the date of her total knee replacement.  I will give   her a prescription for tramadol to take 1 twice a day at the most if she can.  Return in 2-3 months for recheck.  All questions answered and understood.    I, [name], RMA am scribing for, and in the presence of Dr. George Ina, including  xray interpretation if performed.[JB.1]    I, George Ina, MD personally performed the services described in the documentation as scribed by [name], RMA in my presence, and confirm it is both accurate and complete.[EL.1]    Please note that documentation was completed using dragon speak and some errors may have occurred and have not been identified.  Please check the chart for addendums.  If there are major errors that you have concern about please contact the office for   clarification.[JB.1]                 Electronically signed by Fawn Kirk., MD on 07/31/16 1355.   Attribution Key    EL.1 - Fawn Kirk., MD on 07/31/16 1148  JB.1 - Ophelia Shoulder., RMA on 07/30/16 1616          Revision History      Date/Time User Provider Type Action   > 07/31/16 1148 Fawn Kirk., MD Physician Sign    07/30/16 1618 Ophelia Shoulder., RMA Medical Assistant Sign at close encounter

## 2016-08-08 DIAGNOSIS — S76012A Strain of muscle, fascia and tendon of left hip, initial encounter: Secondary | ICD-10-CM

## 2016-08-08 NOTE — Unmapped (Addendum)
ED Attending Attestation Note    Date of service:  08/08/2016    This patient was seen by the advanced practice provider.  I have seen and examined the patient, agree with the workup, evaluation, management and diagnosis.  The care plan has been discussed and I concur.      My assessment reveals a 57 y.o. female who presents due to pain in the left buttock.  The pain starts in the left lower back/pelvic area and radiates down the back of her left leg to above the knee.  Certain movements make it worse.  I can reproduce the pain on palpation of the left buttock.  The patient reports intense physical therapy recently due to a replaced knee on the left and she thinks this may be the cause, or it could be from sitting on airplanes recently.  The patient is already anticoagulated with Lovenox and has an IVC filter.  She has subacute swelling of the left knee due to recent knee replacement that has been going on for 2 months.  No significant pain with range of motion of the knee.  She has no fecal/urinary incontinence/retention, saddle anesthesia, IV drug use.  Pain in this area with ambulation. She has normal pulses throughout.  She has no abdominal tenderness or pain.  She has no vaginal urinary symptoms.  She has a baseline neurological exam.  No trauma or falls.  No pain on palpation of this spine.  No rash.  No palpable fluid collection or abscess or hematoma.  We will have f/u with PCP and/or PT.    Jules Husbands, MD

## 2016-08-08 NOTE — Unmapped (Signed)
Pt ambulate to triage desk with walker with c/o lower back/buttock pain.

## 2016-08-08 NOTE — Unmapped (Signed)
[facility] ED Note  Date of Service: 08/08/2016  Reason for Visit: Back Pain      Patient History     HPI:  Wanda Rowe is a 56 y.o. female who presents to the Emergency Department with a chief complaint of back pain. Patient presents with pain to her left buttock that began yesterday. Patient reports she is undergoing physical therapy for a left knee replacement and they did do a new exercise which she did feel in her gluteus. Patient also flew on an airplane recently, so was sitting for a prolonged period. No fall or direct trauma. Pain does radiate slightly to her posterior left thigh. No paresthesias, numbness, weakness. No urinary or fecal incontinence or retention. No history of back surgery. No fevers. No IVDU. Patient tried xanax with no relief. Patient reports she cannot take NSAIDs because she is on Lovenox for history of DVT.       Past Medical History:   Diagnosis Date   ??? Anxiety    ??? Asthma     Mild   ??? Cancer (CMS Dx)     breast   ??? Diabetes mellitus (CMS Dx)     Type 2   resolved with gastric sleeve   ??? GERD (gastroesophageal reflux disease)    ??? Hyperlipidemia    ??? Hypertension    ??? Hypothyroidism    ??? Polyp of stomach    ??? RSD (reflex sympathetic dystrophy)     R knee       Past Surgical History:   Procedure Laterality Date   ??? ANTERIOR CRUCIATE LIGAMENT REPAIR      L    ??? BREAST SURGERY Right     Lumpectomy chemo and radiation   ??? DENTAL SURGERY      Periodontal    ??? ESOPHAGOGASTRODUODENOSCOPY N/A 07/19/2015    Procedure: ESOPHAGOGASTRODUODENOSCOPY WITH COLONOSCOPY WITH MAC;  Surgeon: Lurene Shadow, MD;  Location: [facility] ENDOSCOPY;  Service: Gastroenterology;  Laterality: N/A;   ??? FOOT SURGERY     ??? gastric sleeve     ??? HYSTERECTOMY     ??? IR IVC FILTER PLACE  05/22/2016   ??? JOINT REPLACEMENT Left     Knee   ??? MENISCECTOMY      L x 2, R x 1       Family History   Problem Relation Age of Onset   ??? Hypertension Mother    ??? Other Mother      DDD   ??? Diabetes Father      Type 2   ??? Hypertension Father     ??? Factor VIII deficiency Father    ??? Other Brother      Back Pain   ??? Diabetes Maternal Grandmother      Type 2   ??? Cancer Maternal Grandmother      Bladder CA   ??? Stroke Maternal Grandmother    ??? Colon Cancer Maternal Grandfather    ??? Breast Cancer Paternal Grandmother        Wanda Rowe  reports that she has never smoked. She has never used smokeless tobacco. She reports that she drinks alcohol. Her drug history is not on file.    Discharge Medication List as of 08/09/2016 12:28 AM      CONTINUE these medications which have NOT CHANGED    Details   albuterol (PROVENTIL;VENTOLIN;PROAIR) 90 mcg/actuation inhaler Inhale 1-2 puffs into the lungs every 6 hours as needed., Starting Wed 03/25/2016, Normal  blood sugar diagnostic (ACCU-CHEK AVIVA PLUS TEST STRP) Strp fsbs qd, Normal      blood-glucose meter (GLUCOSE MONITORING KIT) kit by Other route 2 (two) times daily. Use as instructed - fsbs bid , Historical Med      CALCIUM CITRATE ORAL Take 1 tablet by mouth daily.  , Until Discontinued, Historical Med      cholecalciferol, vitamin D3, 2,000 unit Cap Take 1 capsule by mouth daily., Starting 02/26/2015, Until Discontinued, No Print      conjugated estrogens (PREMARIN) vaginal cream twicwe a week, No Print      cranberry 400 mg Cap Take by mouth., Historical Med      DULoxetine (CYMBALTA) 60 MG capsule Take 1 capsule (60 mg total) by mouth daily., Starting Wed 04/22/2016, Normal      enoxaparin (LOVENOX) 100 mg/mL Syrg Inject 1 mL (100 mg total) subcutaneously 2 times a day., Starting Wed 07/01/2016, No Print      ferrous sulfate 140 mg (45 mg iron) TbSR Take 1 tablet by mouth daily., Starting Sun 07/05/2016, Normal      fluticasone (FLONASE) 50 mcg/actuation nasal spray Use 2 sprays into each nostril daily., Starting 01/24/2013, Until Discontinued, Normal      folic acid-vit B6-vit B12 2.5-25-1 mg Tab Take 1 tablet by mouth daily., Starting Sun 07/05/2016, Normal      gabapentin (NEURONTIN) 600 MG tablet Take 1 tablet  (600 mg total) by mouth 3 times a day., Starting Wed 04/22/2016, Normal      ipratropium (ATROVENT) 0.03 % nasal spray Use 2 sprays into each nostril 2 times a day., Starting Wed 04/22/2016, Normal      LACTOBACILLUS ACIDOPHILUS (PROBIOTIC ORAL) Take 1 capsule by mouth daily., Until Discontinued, Historical Med      levothyroxine (SYNTHROID, LEVOTHROID) 50 MCG tablet Take 1 tablet (50 mcg total) by mouth every morning before breakfast., Starting Sun 07/05/2016, Normal      valACYclovir (VALTREX) 500 MG tablet TAKE 1 TABLET BY MOUTH EVERY DAY, Normal             Allergies:   Allergies as of 08/08/2016 - Fully Reviewed 08/08/2016   Allergen Reaction Noted   ??? Succinylcholine Hives 01/24/2013   ??? Zithromax [azithromycin]     ??? Silver Rash 01/24/2013   ??? Sulfa (sulfonamide antibiotics) Swelling and Rash    ??? Sulfanilamide Rash 01/24/2013       Review of Systems     Review of Systems   Constitutional: Negative.  Negative for fever.   Respiratory: Negative.  Negative for shortness of breath.    Cardiovascular: Negative.  Negative for chest pain.   Gastrointestinal: Negative.  Negative for abdominal pain.   Genitourinary: Negative.  Negative for dysuria.   Musculoskeletal: Positive for back pain. Negative for neck pain.   Neurological: Negative.  Negative for sensory change and focal weakness.   All other systems reviewed and are negative.          Physical Exam     General: Well-developed well-nourished in no acute distress, appears in pain.  HEENT: Head normocephalic atraumatic.  Pupils equal and round.  External ears and nose normal.   Neck: Full range of motion, supple.  Pulmonary: Lungs clear to auscultation bilaterally.  No wheezing, rhonchi, or rales.  Cardiac: Regular rate and rhythm.  No murmurs, rubs, or gallops.  Clear S1, S2.  Musculoskeletal: Moving all extremities appropriately, no obvious weakness noted.  No C/T/L Midline tenderness.  There is tenderness in the left  gluteal region.  Full range of motion  bilateral lower extremities.  5/5 strength bilateral lower extremities.  Sensation intact distally to light-touch.  2/4 DP pulses.  Vascular: Palpable pulses to all extremities.  No pitting edema noted.  Skin: Warm and dry.  Neuro: Alert and oriented.  Cranial nerves II through XII without deficit.    Psych:  Normal affect, Normal judgement, Normal mood.  Normal affect, and behavior.    ED Course and MDM     MEDICAL DECISION MAKING    RECENT VITALS:  BP: 125/75, Temp: 98.6 ??F (37 ??C), Heart Rate: 88, Resp: 16     RADIOLOGY:  No orders to display       LABS:   Labs Reviewed - No data to display    MEDS:  Medications - No data to display      PROCEDURES: N/A    CONSULTS:  None    MEDICAL DECISION MAKING / ED COURSE:    The patient was seen and examined by myself and presented to Dr. No att. providers found, who also saw the patient.    Wanda Rowe is a 56 y.o. female who presents to the Emergency Department with a chief complaint of back pain. Patient appears to be in pain but is nontoxic.  Vitals are normal.  No midline spine tenderness.  Patient with pain to the left gluteus.  Patient is neurovascularly intact.  No trauma dedicated imaging.  Symptoms are consistent with muscle spasm, early sciatica.  Patient is driving home, reports she cannot find a ride.  Patient cannot take NSAIDs and she is on Lovenox.  Patient will be discharged on a Medrol Dosepak and Flexeril.  Patient has physical therapy on Monday.  Follow up with primary care.  Return precautions discussed.    The patient tolerated their visit well.  They were seen and evaluated by the attending physician who agreed with the assessment and plan.  The patient and / or the family were informed of the results of any tests, a time was given to answer questions, a plan was proposed and they agreed with plan.      DISCHARGE DIAGNOSIS:  1. Muscle strain of left gluteal region, initial encounter        PATIENT REFERRED TO:  Gari Crown, MD  [address]  [address]  [place] [address]  [phone]    Schedule an appointment as soon as possible for a visit       [facility] Emergency Department  [address]  [place] [address]  [phone]    As needed, If symptoms worsen      DISCHARGE MEDICATIONS:  Discharge Medication List as of 08/09/2016 12:28 AM      START taking these medications    Details   cyclobenzaprine (FLEXERIL) 10 MG tablet Take 1 tablet (10 mg total) by mouth 3 times a day as needed for Muscle spasms., Starting Sun 08/09/2016, Print      methylPREDNISolone (MEDROL, PAK,) 4 mg tablet follow package directions, Print               Critical Care Time (Attendings)          Kathyrn Sheriff, [place]  08/09/16 [phone]

## 2016-08-09 ENCOUNTER — Inpatient Hospital Stay
Admit: 2016-08-09 | Discharge: 2016-08-09 | Disposition: A | Discharge status: Home / Self Care | Payer: PRIVATE HEALTH INSURANCE

## 2016-08-09 MED ORDER — cyclobenzaprine (FLEXERIL) 10 MG tablet
10 | ORAL_TABLET | Freq: Three times a day (TID) | ORAL | 0 refills | Status: AC | PRN
Start: 2016-08-09 — End: 2016-08-11

## 2016-08-09 MED ORDER — methylPREDNISolone (MEDROL, PAK,) 4 mg tablet
4 | PACK | ORAL | 0 refills | Status: AC
Start: 2016-08-09 — End: 2016-09-22

## 2016-08-09 NOTE — Unmapped (Signed)
Take medications as directed. Do not drive or drink alcohol after taking Flexeril. Follow-up with your doctor for further management. Return to the ER for any leg weakness or any emergent concerns.

## 2016-08-09 NOTE — Unmapped (Signed)
Pt d/c to home with instructions provided for home care and follow up. Pt is alert and orientated x 3, respirations are even and unlabored and the patient is without distress. GCS 15. Pt denies further needs or questions and ambulates out of the ED with a steady gait.

## 2016-08-10 NOTE — Unmapped (Signed)
PCN CARE COORDINATION     Chart reviewed. Forwarding to Care Coordinator to follow up.     Alois Cliche, RN  [facility] Management[facility]  [phone]

## 2016-08-10 NOTE — Unmapped (Signed)
I called pt regarding recent visit to ED for back pain. Pt stated that it hasn't gotten any better, and would like to be seen by Dr. Rico Junker as soon as possible. PCP has no HDF spots for me to schedule pt. Please schedule pt.     Almedia Balls RMA  Care Coordinator  [phone]

## 2016-08-10 NOTE — Telephone Encounter (Signed)
Dr Rico Junker where would you like me to schedule her?

## 2016-08-11 ENCOUNTER — Ambulatory Visit: Admit: 2016-08-11 | Discharge: 2016-08-11 | Payer: PRIVATE HEALTH INSURANCE

## 2016-08-11 DIAGNOSIS — M7918 Myalgia, other site: Secondary | ICD-10-CM

## 2016-08-11 MED ORDER — cyclobenzaprine (FLEXERIL) 10 MG tablet
10 | ORAL_TABLET | Freq: Three times a day (TID) | ORAL | 0 refills | Status: AC | PRN
Start: 2016-08-11 — End: 2016-12-11

## 2016-08-11 MED ORDER — enoxaparin (LOVENOX) 100 mg/mL Syrg
100 | Freq: Every day | SUBCUTANEOUS | 0 refills | 20.00000 days | Status: AC
Start: 2016-08-11 — End: 2016-09-22

## 2016-08-11 MED ORDER — enoxaparin (LOVENOX) 100 mg/mL Syrg
100 | Freq: Two times a day (BID) | SUBCUTANEOUS | 0 refills | 20.00000 days | Status: AC
Start: 2016-08-11 — End: 2016-08-11

## 2016-08-11 MED ORDER — oxyCODONE-acetaminophen (PERCOCET) 5-325 mg per tablet
5-325 | ORAL_TABLET | Freq: Three times a day (TID) | ORAL | 0 refills | Status: AC | PRN
Start: 2016-08-11 — End: 2016-09-10

## 2016-08-11 NOTE — Assessment & Plan Note (Signed)
Due to hematoma from anticoag increase vs MSK strain. PT, flexeril pain control

## 2016-08-11 NOTE — Unmapped (Signed)
[facility] [address]  [address]  [place] [address]    Name:  Wanda Rowe Date of Birth: 1960/05/22 (56 y.o.)   MRN: [id number]    Date of Service:  08/11/2016     Subjective:     Chief Complaint   Patient presents with   ??? Back Pain     ER Follow up     History of Present Illness:  Wanda Rowe is a(n) 56 y.o. female here today for the following:   Had procedure done on knee June 5. Pain in left buttock started Friday night was in airport Friday pulling luggage and lifting carry on when started. Awoke Saturday with sebvere pain and left post thigh numbness went to ed Saturday night due to pain. Dx with sciatica given flexeril , medrol using percocet prn. Minimal improvement increased lovenox to 100 mg bid on wednesdayThursday Friday Saturday for flight back to 40 mg daily since Sunday on Saturday tried to roll out left buttock noticed bruise this am.         Current Outpatient Prescriptions   Medication Sig Dispense Refill   ??? albuterol (PROVENTIL;VENTOLIN;PROAIR) 90 mcg/actuation inhaler Inhale 1-2 puffs into the lungs every 6 hours as needed. 1 Inhaler 3   ??? blood sugar diagnostic (ACCU-CHEK AVIVA PLUS TEST STRP) Strp fsbs qd 100 strip 1   ??? blood-glucose meter (GLUCOSE MONITORING KIT) kit by Other route 2 (two) times daily. Use as instructed - fsbs bid      ??? CALCIUM CITRATE ORAL Take 1 tablet by mouth daily.       ??? cholecalciferol, vitamin D3, 2,000 unit Cap Take 1 capsule by mouth daily. 1 capsule 0   ??? conjugated estrogens (PREMARIN) vaginal cream twicwe a week 42.5 g 0   ??? cranberry 400 mg Cap Take by mouth.     ??? cyclobenzaprine (FLEXERIL) 10 MG tablet Take 1 tablet (10 mg total) by mouth 3 times a day as needed for Muscle spasms. 10 tablet 0   ??? DULoxetine (CYMBALTA) 60 MG capsule Take 1 capsule (60 mg total) by mouth daily. 30 capsule 5   ??? enoxaparin (LOVENOX) 100 mg/mL Syrg Inject 1 mL (100 mg total) subcutaneously 2 times a day. 1 mL 0   ??? ferrous sulfate  140 mg (45 mg iron) TbSR Take 1 tablet by mouth daily. 90 tablet 1   ??? fluticasone (FLONASE) 50 mcg/actuation nasal spray Use 2 sprays into each nostril daily. 16 g 5   ??? folic acid-vit B6-vit B12 2.5-25-1 mg Tab Take 1 tablet by mouth daily. 90 each 1   ??? gabapentin (NEURONTIN) 600 MG tablet Take 1 tablet (600 mg total) by mouth 3 times a day. 270 tablet 5   ??? ipratropium (ATROVENT) 0.03 % nasal spray Use 2 sprays into each nostril 2 times a day. 30 mL 5   ??? LACTOBACILLUS ACIDOPHILUS (PROBIOTIC ORAL) Take 1 capsule by mouth daily.     ??? levothyroxine (SYNTHROID, LEVOTHROID) 50 MCG tablet Take 1 tablet (50 mcg total) by mouth every morning before breakfast. 90 tablet 1   ??? methylPREDNISolone (MEDROL, PAK,) 4 mg tablet follow package directions 21 Package 0   ??? valACYclovir (VALTREX) 500 MG tablet TAKE 1 TABLET BY MOUTH EVERY DAY 30 tablet 0     No current facility-administered medications for this visit.       Review of Systems   Constitutional: Negative for fatigue.   All other  systems reviewed and are negative.           Objective:     Vitals:    08/11/16 1414   BP: 122/78   BP Location: Left arm   Patient Position: Sitting   BP Cuff Size: Regular   Pulse: 97   Resp: 12   SpO2: 99%   Weight: 193 lb 9.6 oz (87.8 kg)   Height: 5' 8 (1.727 m)     Body mass index is 29.44 kg/m??.    Physical Exam   Constitutional: She appears well-developed and well-nourished.   HENT:   Head: Normocephalic and atraumatic.   Cardiovascular: Normal rate, regular rhythm and normal heart sounds.  Exam reveals no gallop and no friction rub.    No murmur heard.  Pulmonary/Chest: Effort normal and breath sounds normal.   Abdominal: Soft. Bowel sounds are normal.   Musculoskeletal:   Left buttock with large hematoma tender soft tissue swelling medially   Psychiatric: She has a normal mood and affect. Her behavior is normal.            Assessment/Plan:       Left buttock pain  Due to hematoma from anticoag increase vs MSK strain. PT, flexeril  pain control    DVT (deep venous thrombosis) (CMS Dx)  Fu heme would like to d/c lovenox                    Wanda Gavina Johnn Hai, MD

## 2016-08-11 NOTE — Unmapped (Signed)
End of day per Dr Rexene Edison. Pt scheduled

## 2016-08-11 NOTE — Unmapped (Signed)
Fu heme would like to d/c lovenox

## 2016-08-15 NOTE — Unmapped (Signed)
Chief Complaint  Patient presents with  ?????? Tachycardia  ?????? Bleeding/Bruising      This is a 56 year old female with past medical history significant for DVTs  on  Lovenox followed by Dr. Magdalene River  who presents with increasing bruising of  the left buttock/left thigh area. 8 days ago the patient developed pain in the  left lower back while trying to get off the plane She the pain was so severe  that she was unable to walk off the plane. patient was seen at [facility]  one week ago and was diagnosed with a left gluteal muscle strain and discharged  on a prednisone Dosepak and Flexeril. A few days later patient developed  bruising in the back of her left buttock and was subsequently seen by her PCP  and was diagnosed with a hematoma. Patient is currently undergoing physical  therapy in view of a left knee replacement that she had in April but the  bruising and the pain was so severe that she was unable to do physical therapy.  Patient did increase her Lovenox prior to flying. However over the past couple  days she has noticed increasing bruising in the left hip/thigh area and actually  stopped taking the Lovenox yesterday.  patient states she just does not feel  right. She feels dizzy upon standing and is experiencing palpitations when she  stands up she feels as if she is not getting enough air she denies any falls or  trauma she does not take any anti-inflammatory medications or aspirin she has  not noticed any blood in her stool.            No current facility-administered medications for this encounter.    Current Outpatient Prescriptions  Medication Sig Dispense Refill  ?????? albuterol (PROVENTIL HFA) 90 mcg/actuation HFA Aerosol Inhaler Take 2 Puffs by  inhalation every 6 hours as needed.  ?????? CALCIUM CITRATE PO Take  by mouth daily.  ?????? CHOLECALCIFEROL, VITAMIN D3, PO Take  by mouth.  ?????? DULoxetine (CYMBALTA) 60 mg Capsule, Delayed Release(E.C.) Take 60 mg by mouth  daily. Reported on 07/24/2015  ??????  enoxaparin (LOVENOX) 40 mg/0.4 mL injection 40 mg by Subcutaneous route daily.  ?????? estrogens, conjugated, (PREMARIN) 0.625 mg/gram Cream Insert into vagina twice  a week. 42.5 g 1  ?????? fluticasone (FLONASE) 50 mcg/Actuation NA nasal spray Spray 2 Sprays into nose  daily. Each nostril  ?????? gabapentin (NEURONTIN) 600 mg tablet Take 600 mg by mouth 3 times daily.  ?????? ipratropium (ATROVENT) 0.03 % NA nasal spray Spray 2 Sprays into nose 2 times  daily. Each nostril  ?????? lactobacillus rhamnosus (CULTURELLE) 10 billion cell Take 1 Cap by mouth  daily.  ?????? levothyroxine (SYNTHROID) 75 mcg PO tablet Take 75 mcg by mouth daily.  ?????? methylPREDNISolone (MEDROL, PAK,) 4 mg tab (dosepak) follow package directions  1 Dosepak 0      Allergies  Allergen Reactions  ?????? Azithromycin    **DOES NOT WORK**    ?????? Succinylcholine Other (See Comments)    Delayed paralysis  Difficulty w/ muscle movement the day after surgery  Other reaction(s): Muscle Aches  Difficulty w/ muscle movement the day after surgery  ?????? Sulfasalazine Rash  ?????? Tegaderm Rash  ?????? Silver Rash  ?????? Sulfa (Sulfonamide Antibiotics) Swelling and Rash  ?????? Sulfanilamide Rash    Sulfonamides      Past Medical History:  Diagnosis Date  ?????? Anemia   in past  ?????? Anxiety  ??????  Arthritis   knees  ?????? Asthma  ?????? Awareness under anesthesia   woke up during endoscopy  ?????? Back problem   occ back ache  ?????? Cancer (CMS HCC)   right breast- chemo. and radiation in 2014 & 2015, hx of Lumpectomy  ?????? Chest pain   when under stress-testing done & no cause of chest pain was found  ?????? Complication of anesthesia   rxn to succinylcholine-day after surgery, pt  had difficulty w/ muscle  movement-dr j collins in anesthesia dept notified- Informed Dr. Unknown Foley, [facility]  for upcoming procedure on 07/07/16  ?????? Dysplasia  ?????? GERD (gastroesophageal reflux disease)   no meds  ?????? Hernia   hiatal-resolved with wt loss surg  ?????? Hypertension   in past & no longer taking medication  ??????  Irregular heart beat   MVP & heart murmur-used to take pre-dental antibiotics  ?????? Liver disorder   pt told she has a fatty liver-resolved per pt  ?????? Mobility impaired 07/01/2016   uses cane currently d/t recent s/p left knee replacement on 05/26/16  ?????? Occlusive thrombus 2017   dvts in past; IVC filter inserted 05/22/16  ?????? Osteopenia  ?????? RSD (reflex sympathetic dystrophy)  ?????? Suspected sleep apnea 07/01/2016   pt. states prior to knee replacement when she was given preop meds, she heard  the nurses calling her name and states that something had dropped suddenly: pt.  thinks it may have been her 02. She states she was advised to get a sleep study  in the future. pt. is not sure what med caused this to happen prior to  surgery  on 05/26/16.  ?????? Thyroid disorder   hypothyroidism  ?????? Venous thrombosis and embolism      Social History  Substance Use Topics  ?????? Smoking status: Never Smoker  ?????? Smokeless tobacco: Never Used  ?????? Alcohol use Yes     Comment: very occasionally      Family History  Problem Relation Age of Onset  ?????? High Blood Pressure Mother  ?????? Diabetes Father  ?????? High Blood Pressure Father  ?????? Heart Problems Paternal Grandfather       mi  ?????? Cancer Other       unspecified grandmother  ?????? Diabetes Other       unspecified grandmother  ?????? Stroke Other       unspecified grandmother  ?????? Colon Cancer Maternal Grandfather 60  ?????? Cancer Maternal Grandmother 60       Bladder  ?????? Anesthesia Complications Neg Hx      Review of Systems  Constitutional: Positive for fatigue. Negative for fever.  Respiratory: Positive for shortness of breath. Negative for chest tightness.  Cardiovascular: Negative for chest pain and leg swelling.  All other systems reviewed and are negative.      BP 134/80    Pulse (!) 102    Resp 16    Ht 5' 8 (1.727 m)    Wt 195 lb (88.5  kg)    LMP 04/12/2008    Breastfeeding? No    BMI 29.65 kg/m??????    Physical Exam  Constitutional: She is oriented to person, place, and time. She  appears  well-developed and well-nourished. No distress.  Patient appears pale  HENT:  Head: Normocephalic and atraumatic.  Eyes: EOM are normal. Pupils are equal, round, and reactive to light.  Conjunctiva are pale  Neck: Normal range of motion. Neck supple.  Cardiovascular: Regular rhythm.  Exam reveals no friction rub.  No murmur heard.  Patient is tachycardic  Pulmonary/Chest: Effort normal and breath sounds normal. No respiratory  distress. She has no wheezes. She has no rales. She exhibits no tenderness.  Abdominal: Soft. Bowel sounds are normal.  Musculoskeletal: Normal range of motion. She exhibits no edema or deformity.  Neurological: She is alert and oriented to person, place, and time.  Skin: Skin is warm and dry. She is not diaphoretic.  Patient does have bruising in the posterior aspect of her left thigh, groin  posterior aspect of left knee  Psychiatric: She has a normal mood and affect. Her behavior is normal.      Procedures          ED COURSE      A very pleasant 56 year old female with past medical history significant for  DVTs on Lovenox presented with increasing bruising in the left thigh area  patient's workup revealed a hemoglobin of 7.4 in addition patient is symptomatic  consisting of shortness of breath lightheadedness dizziness and palpitations. I  discussed the case with Neale Burly covering for Dr. Selena Batten she would like the  patient to be transfused 1 unit of packed red blood cells.    Patient was transfused 1 unit of packed red blood cells without any  complications. She is going to follow up with Dr. Selena Batten on July 16. She is to  return for worsening of any of her symptoms she is being discharged in a stable  condition.    Patient's BP is in the normal range.

## 2016-08-15 NOTE — Unmapped (Signed)
This is a notification of a Discharge Alert generated by [employer].     This patient visited the following location: [facility] ([facility]  Admit Date: 08/15/2016 15:40  Discharge Date: 08/15/2016 22:50  Visit Type: Emergency  Chief complaint: Contusion of left thigh, initial encounter (R60.45WU)  Diagnosis: Contusion of left thigh, initial encounter; Other symptoms and signs involving the nervous system; Anemia, unspecified; Personal history of other medical treatment (J81.19JY; R29.818; D64.9; Z92.89)  Alert Category:   Attending Physician: Oran Rein  Referring Physician: Magdalene River  Consulting Physician:   Copied Physician(s):     [employer] is a not-for-profit corporation that was founded in 1997 as a   community effort to enhance the ability to share health information   electronically in the [place] area. Today, [employer]   is one of the nation????????s largest and most financially sustainable regional   health information exchange (HIE) organizations.

## 2016-08-16 DIAGNOSIS — S7012XD Contusion of left thigh, subsequent encounter: Secondary | ICD-10-CM | POA: Insufficient documentation

## 2016-08-16 NOTE — Unmapped (Signed)
--------------------------------------------------------------------------------  Attestation signed by Twana First., MD at 08/17/16 1203  Attending Note:  Internal Medicine    I have seen and examined the patient in conjunction with the resident team. I  agree with the resident findings, interpretation of data and management plan as  documented in their note linked to mine. Any revisions are noted below.      Cc:  Dyspnea/ LLE pain    HPI:  Pt is a 56 y.o. female with past medical history of breast CA and VTE s/p IVC  filter who presents with hematoma of left thigh.  Pt has noted for several days  ecchymosis on her left lower ext.  Pt had hx of DVT for which progressively  worsened while be on oral anticoagulant.  Therefore patient was started on  LMWH.  Pt was in the tail end of her treatment.  Pt went on a trip via flight.  She noticed after her trip- ecchymosis and left sided leg pain.  She called  Heme/Onc office with her concerns.  Pt discontinued LMWH 3 days prior to  admission.  Other symptoms patient presented with includes dyspnea on exertion,  diaphoresis and chest discomfort.  Pt has no other complaints such as  headaches, dizziness, abdominal pain, N/V/D/C.  She also denies any subjective  fevers, chills.    In the ED patient was noted to be hemodynamically stable with sinus  tachycardia.  Pertinent labs obtained include Na+ 138, K+ 3.7, CO2 17, AG 15,  Crt 0.86, ASt 28, ALT 22, Alk Phos 68, WBC 10.6, Hgb 10.0, Hct 29.5, Plts 293.  CT abdomen/ pelvis revealed 5.4 x 11.3x 11 cm hematoma over the left gluteus  minimus region.  Increased in size.      ROS:  Full 12 point review of systems performed and is negative except as noted  in history of present illness.    Past Medical History:  Diagnosis Date  ?????? Anemia   in past  ?????? Anxiety  ?????? Arthritis   knees  ?????? Asthma  ?????? Awareness under anesthesia   woke up during endoscopy  ?????? Back problem   occ back ache  ?????? Cancer (CMS HCC)   right breast-  chemo. and radiation in 2014 & 2015, hx of Lumpectomy  ?????? Chest pain   when under stress-testing done & no cause of chest pain was found  ?????? Complication of anesthesia   rxn to succinylcholine-day after surgery, pt  had difficulty w/ muscle  movement-dr j collins in anesthesia dept notified- Informed Dr. Unknown Foley, [facility]  for upcoming procedure on 07/07/16  ?????? Dysplasia  ?????? GERD (gastroesophageal reflux disease)   no meds  ?????? Hernia   hiatal-resolved with wt loss surg  ?????? Hypertension   in past & no longer taking medication  ?????? Irregular heart beat   MVP & heart murmur-used to take pre-dental antibiotics  ?????? Liver disorder   pt told she has a fatty liver-resolved per pt  ?????? Mobility impaired 07/01/2016   uses cane currently d/t recent s/p left knee replacement on 05/26/16  ?????? Occlusive thrombus 2017   dvts in past; IVC filter inserted 05/22/16  ?????? Osteopenia  ?????? RSD (reflex sympathetic dystrophy)  ?????? Suspected sleep apnea 07/01/2016   pt. states prior to knee replacement when she was given preop meds, she heard  the nurses calling her name and states that something had dropped suddenly: pt.  thinks it may have been her 02. She states she was advised  to get a sleep study  in the future. pt. is not sure what med caused this to happen prior to  surgery  on 05/26/16.  ?????? Thyroid disorder   hypothyroidism  ?????? Venous thrombosis and embolism    Past Surgical History:  Procedure Laterality Date  ?????? HX BREAST BIOPSY  july 2014   right  ?????? HX BREAST LUMPECTOMY  02-2013   right; also bilat reduction; back to surg next day for post op bleeding  ?????? HX COLPOSCOPY  ?????? HX FOOT SURGERY   right  ?????? HX GUM SURGERY   done for receding gums  ?????? HX HYSTERECTOMY, TOTAL ABDOMINAL  ?????? HX KNEE SURGERY   left x 3, right x 1  ?????? HX LAPAROSCOPY  ?????? HX OTHER SURGICAL HISTORY  jan 2014   gastric sleeve  ?????? HX OTHER SURGICAL HISTORY   ins. portacath  ?????? HX TOTAL KNEE ARTHROPLASTY Left 05/26/2016  ?????? HX UPPER GI ENDOSCOPY    mult in past due to stomach polyps  ?????? HX VAGINAL DELIVERY  ?????? HX VASCULAR SURGERY  05/22/2016   IVC filter  ?????? REMOVAL OF OVARY/TUBE(S)   bilateral salpingo-oophorectomy    Family History  Problem Relation Age of Onset  ?????? High Blood Pressure Mother  ?????? Diabetes Father  ?????? High Blood Pressure Father  ?????? Heart Problems Paternal Grandfather       mi  ?????? Cancer Other       unspecified grandmother  ?????? Diabetes Other       unspecified grandmother  ?????? Stroke Other       unspecified grandmother  ?????? Colon Cancer Maternal Grandfather 60  ?????? Cancer Maternal Grandmother 60       Bladder  ?????? Anesthesia Complications Neg Hx    Social History    Social History  ?????? Marital status: Single    Spouse name: N/A  ?????? Number of children: N/A  ?????? Years of education: N/A    Occupational History  ?????? Not on file.    Social History Main Topics  ?????? Smoking status: Never Smoker  ?????? Smokeless tobacco: Never Used  ?????? Alcohol use Yes     Comment: very occasionally  ?????? Drug use: No  ?????? Sexual activity: Yes    Partners: Male    Other Topics Concern  ?????? Not on file    Social History Narrative  ?????? No narrative on file      Objective:  Vitals:   08/16/16 2311 08/17/16 0310 08/17/16 0500 08/17/16 0733  BP: 119/63 116/58  118/48  Pulse: 89 81  87  Resp: 18 18  16   Temp: 97.3 ??????F (36.3 ??????C) 98.3 ??????F (36.8 ??????C)  98.6 ??????F (37 ??????C)  TempSrc: Oral Oral  Oral  SpO2: 100% 99%  100%  Weight:   195 lb 6.4 oz (88.6 kg)  Height:      Labs Personally Reviewed:  Recent Labs     08/16/16   1243  08/17/16   0029  08/17/16   0510  NA  138  136  137  K  3.7  3.9  4.2  CO2  17*  17*  20*  BUN  19  22  23   CREATININE  0.86  0.70  0.68    Lab Results  Component Value Date   GLU 121 (HIGH) 08/17/2016   GLU 123 (HIGH) 08/17/2016   GLU 163 (HIGH) 08/16/2016        Recent Labs     08/16/16  1243  08/16/16   1537  08/17/16   0029  08/17/16   0510  WBC  10.6   --   9.3  7.3  HGB  10.0*  9.4*  8.5*  8.3*  HCT  29.5*   --   24.9*  24.5*  MCV  87.8   88.8  89.5  89.6  PLT  293   --   194  180        Lab Results  Component Value Date   INR 1.1 08/16/2016   INR 1.0 05/22/2016   INR 1.2 (HIGH) 02/10/2013   PROTIME 12.3 08/16/2016   PROTIME 11.1 05/22/2016   PROTIME 12.2 (HIGH) 02/10/2013        Lab Results  Component Value Date   BILITOT 2.7 (HIGH) 08/16/2016   AST 22 08/16/2016   ALT 28 08/16/2016   ALKPHOS 68 08/16/2016   ALB 4.5 08/16/2016        Imaging Reviewed:    CT angio abd/ pelvis w/wo contrast (08/16/2016):  1. 5.4 x 11.3 x 11 cm hematoma over the left gluteus minimus region. No  pseudoaneurysm identified at this time. In comparison to a CT performed  yesterday at 1630 hours, this it previously measured 4 x 6 x 7 cm,  therefore this has increased in size. I don't see a definite  pseudoaneurysm, however given interval progression, a formal angiogram may  be helpful to further evaluate.  2. IVC filter  3. Mild nonspecific hepatomegaly      Assessment/ Plan:    Active Hospital Problems   Diagnosis  ?????? **Hematoma of thigh, left, subsequent encounter [S70.12XD]  ?????? 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  Hematoma of left thigh  - imaging above- hematoma appears increased in size.  - no surgical intervention at this time.  - continue to monitor neurovascular checks.  - if H/H continues to trend downwards possible angiography.  - vascular surgery following.  Appreciate recommendations.    2.  Acute LLE DVT  - venous duplex noted.  - hold anticoagulation at this time due to hematoma, patient does have IVC  Filter.    3.  Dyspnea/ Chest Discomfort  - due to acute DVT in addition of concerns of hypercoaguable state will obtain  CTPA.  - pending on results possible IR intervention.  - other etiologies may be due to anemia.  - continue to monitor.    4.  Anemia  - H/H continues to slowly drift downwards.  - monitor closely.    5.   Hypothyroidism  - on synthroid PO.    6.  Hx of Breast Ca  - Heme/Onc consulted.      Disposition:  Anticipate d/c in 1- 2 days.      [name], MD      Scheduled Medications:    DULoxetine 60 mg Oral Daily  gabapentin 600 mg Oral BID  levothyroxine 75 mcg Oral Daily    PRN Medications:    acetaminophen 650 mg Oral Q6H PRN  albuterol 2 Puff Inhalation Q6H PRN  atropine 0.5 mg Intravenous Q5 Min PRN  cyclobenzaprine 5 mg Oral TID PRN  nitroglycerin 0.4 mg Sublingual Q5 Min PRN  oxyCODONE 5 mg Oral Q4H PRN        --------------------------------------------------------------------------------    HISTORY &  PHYSICAL  Patient: Wanda Rowe  MRN: [id number]  CSN: [id number]    Chief Complaint    Hematoma of thigh, left, subsequent encounter      History of Present Illness    Patient is a 56 y.o. female with past medical history of breast CA and VTE who  presents with hematoma of left thigh. Recent history is notable for IVC filter  on 4/20, L total knee on 4/24, manipulation of L knee under anesthesia on 6/5,  and she took two flights during the week of 7/2 for which she was on lovenox for  dvt ppx. On 7/6, she was unable to walk 2/2 pain of the LLE. It worsened to the  point where the pain was constant and there was posterior L thigh numbness, and  she was seen at [facility] ED on 7/7 and treated for muscle strain. On 7/10  she was seen by her PCP for ED followup and diagnosed with a hematoma.    Her lovenox was stopped on 7/13, and on 7/14, the patient was seen at [facility] ED  with continued LLE pain as well as SOB, palpitations, and dizziness upon  standing. Hb at presentation was 7.4, and she was transfused with 1u pRBCs. She  was stable for discharge and felt good enough to drive home. Today, low grade  fever, tachycardia, and SOB remitted, and she went back to the [facility] ED, where  she was seen and transferred here.    On presentation to [address], patient is seen laying in bed AFVSS. She is very  pleasant and  states that her pain is minimal feels like a bruise. She admits  to fever, SOB, CP, palpitations. Denies h/o kidney disease. Denies new onset  numbness or tingling. Denies new onset pain/numbness/tingling in L leg and L  foot, states that she does have residual numbness on L knee s/p TKA. She states  that she was on rocephin for an asymptomatic UTI.    CT imaging on 7/15 revealed 3x increase in size of hematoma from CT on 7/14.    Review of Systems    Review of Systems  Constitutional: Positive for fever.  HENT: Negative for hearing loss, nosebleeds and sore throat.  Eyes: Negative for pain.  Respiratory: Positive for shortness of breath. Negative for cough, hemoptysis,  sputum production and wheezing.  Cardiovascular: Positive for chest pain, palpitations, orthopnea and leg  swelling. Negative for claudication and PND.  Gastrointestinal: Negative for abdominal pain, diarrhea, nausea and vomiting.  Genitourinary: Negative for dysuria, frequency and urgency.  Musculoskeletal: Positive for back pain and joint pain.  Neurological: Negative for tingling and sensory change.  Endo/Heme/Allergies: Bruises/bleeds easily.      Past Medical History    Past Medical History:  Diagnosis Date  ?????? Anemia   in past  ?????? Anxiety  ?????? Arthritis   knees  ?????? Asthma  ?????? Awareness under anesthesia   woke up during endoscopy  ?????? Back problem   occ back ache  ?????? Cancer (CMS HCC)   right breast- chemo. and radiation in 2014 & 2015, hx of Lumpectomy  ?????? Chest pain   when under stress-testing done & no cause of chest pain was found  ?????? Complication of anesthesia   rxn to succinylcholine-day after surgery, pt  had difficulty w/ muscle  movement-dr j collins in anesthesia dept notified- Informed Dr. Unknown Foley, [facility]  for upcoming procedure on 07/07/16  ?????? Dysplasia  ?????? GERD (gastroesophageal reflux disease)  no meds  ?????? Hernia   hiatal-resolved with wt loss surg  ?????? Hypertension   in past & no longer taking medication  ?????? Irregular  heart beat   MVP & heart murmur-used to take pre-dental antibiotics  ?????? Liver disorder   pt told she has a fatty liver-resolved per pt  ?????? Mobility impaired 07/01/2016   uses cane currently d/t recent s/p left knee replacement on 05/26/16  ?????? Occlusive thrombus 2017   dvts in past; IVC filter inserted 05/22/16  ?????? Osteopenia  ?????? RSD (reflex sympathetic dystrophy)  ?????? Suspected sleep apnea 07/01/2016   pt. states prior to knee replacement when she was given preop meds, she heard  the nurses calling her name and states that something had dropped suddenly: pt.  thinks it may have been her 02. She states she was advised to get a sleep study  in the future. pt. is not sure what med caused this to happen prior to  surgery  on 05/26/16.  ?????? Thyroid disorder   hypothyroidism  ?????? Venous thrombosis and embolism      Past Surgical History    Past Surgical History:  Procedure Laterality Date  ?????? HX BREAST BIOPSY  july 2014   right  ?????? HX BREAST LUMPECTOMY  02-2013   right; also bilat reduction; back to surg next day for post op bleeding  ?????? HX COLPOSCOPY  ?????? HX FOOT SURGERY   right  ?????? HX GUM SURGERY   done for receding gums  ?????? HX HYSTERECTOMY, TOTAL ABDOMINAL  ?????? HX KNEE SURGERY   left x 3, right x 1  ?????? HX LAPAROSCOPY  ?????? HX OTHER SURGICAL HISTORY  jan 2014   gastric sleeve  ?????? HX OTHER SURGICAL HISTORY   ins. portacath  ?????? HX TOTAL KNEE ARTHROPLASTY Left 05/26/2016  ?????? HX UPPER GI ENDOSCOPY   mult in past due to stomach polyps  ?????? HX VAGINAL DELIVERY  ?????? HX VASCULAR SURGERY  05/22/2016   IVC filter  ?????? REMOVAL OF OVARY/TUBE(S)   bilateral salpingo-oophorectomy      Family History    Family History  Problem Relation Age of Onset  ?????? High Blood Pressure Mother  ?????? Diabetes Father  ?????? High Blood Pressure Father  ?????? Heart Problems Paternal Grandfather       mi  ?????? Cancer Other       unspecified grandmother  ?????? Diabetes Other       unspecified grandmother  ?????? Stroke Other       unspecified  grandmother  ?????? Colon Cancer Maternal Grandfather 60  ?????? Cancer Maternal Grandmother 60       Bladder  ?????? Anesthesia Complications Neg Hx      Social History    Social History    Social History  ?????? Marital status: Single    Spouse name: N/A  ?????? Number of children: N/A  ?????? Years of education: N/A    Occupational History  ?????? Not on file.    Social History Main Topics  ?????? Smoking status: Never Smoker  ?????? Smokeless tobacco: Never Used  ?????? Alcohol use Yes     Comment: very occasionally  ?????? Drug use: No  ?????? Sexual activity: Yes    Partners: Male    Other Topics Concern  ?????? Not on file    Social History Narrative  ?????? No narrative on file      Medications    Allergies:  Allergies  Allergen Reactions  ?????? Azithromycin    **  DOES NOT WORK**    ?????? Succinylcholine Other (See Comments)    Delayed paralysis  Difficulty w/ muscle movement the day after surgery  Other reaction(s): Muscle Aches  Difficulty w/ muscle movement the day after surgery  ?????? Sulfasalazine Rash  ?????? Tegaderm Rash  ?????? Silver Rash  ?????? Sulfa (Sulfonamide Antibiotics) Swelling and Rash  ?????? Sulfanilamide Rash    Sulfonamides      Home Meds:  Prescriptions Prior to Admission  Medication Sig Dispense Refill Last Dose  ?????? albuterol (PROVENTIL HFA) 90 mcg/actuation HFA Aerosol Inhaler Take 2 Puffs by  inhalation every 6 hours as needed.   > Month at Unknown time  ?????? CALCIUM CITRATE PO Take  by mouth daily.   08/15/2016 at Unknown time  ?????? CHOLECALCIFEROL, VITAMIN D3, PO Take  by mouth.   Past Week at Unknown time  ?????? cyclobenzaprine (FLEXERIL) 10 mg tablet Take 10 mg by mouth 3 times daily as  needed for muscle spasm.   Past Week at Unknown time  ?????? DULoxetine (CYMBALTA) 60 mg Capsule, Delayed Release(E.C.) Take 60 mg by mouth  daily. Reported on 07/24/2015   08/16/2016 at Unknown time  ?????? enoxaparin (LOVENOX) 40 mg/0.4 mL injection 40 mg by Subcutaneous route daily.  08/13/2016 at Unknown time  ?????? estrogens, conjugated, (PREMARIN) 0.625 mg/gram  Cream Insert into vagina twice  a week. 42.5 g 1 > Month at Unknown time  ?????? fluticasone (FLONASE) 50 mcg/Actuation NA nasal spray Spray 2 Sprays into nose  daily. Each nostril   08/16/2016 at Unknown time  ?????? gabapentin (NEURONTIN) 600 mg tablet Take 600 mg by mouth 2 times daily.  08/16/2016 at Unknown time  ?????? ipratropium (ATROVENT) 0.03 % NA nasal spray Spray 2 Sprays into nose 2 times  daily. Each nostril    08/16/2016 at Unknown time  ?????? lactobacillus rhamnosus (CULTURELLE) 10 billion cell Take 1 Cap by mouth  daily.   Past Week at Unknown time  ?????? levothyroxine (SYNTHROID) 75 mcg PO tablet Take 66 mcg by mouth daily.   Past  Week at Unknown time  ?????? oxyCODONE-acetaminophen (PERCOCET) 5-325 mg per tablet Take 1-2 Tabs by mouth  every 6 hours as needed for Pain.   Past Week at Unknown time      Inpatient Meds:  Scheduled:  Continuous:  PRN:  acetaminophen 650 mg Oral Q6H PRN  atropine 0.5 mg Intravenous Q5 Min PRN  nitroglycerin 0.4 mg Sublingual Q5 Min PRN      Vital Signs    BP 124/58    Pulse 78    Temp 99.3 ??????F (37.4 ??????C) (Oral)    Resp 18    Ht 5' 8  (1.727 m)    Wt 195 lb 4 oz (88.6 kg)    LMP 04/12/2008    SpO2 100%    BMI  29.69 kg/m??????    Intake/Output Summary (Last 24 hours) at 08/16/16 2203  Last data filed at 08/16/16 1909   Gross per 24 hour  Intake             2000 ml  Output                0 ml  Net             2000 ml      Physical Exam    Physical Exam  Constitutional: She appears well-developed and well-nourished.  HENT:  Head: Normocephalic and atraumatic.  Right Ear: External ear normal.  Left Ear: External  ear normal.  Nose: Nose normal.  Eyes: Conjunctivae and EOM are normal. Pupils are equal, round, and reactive to  light.  Cardiovascular: Normal rate, regular rhythm, normal heart sounds and intact  distal pulses.  Exam reveals no gallop and no friction rub.  No murmur heard.  Pulmonary/Chest: Effort normal and breath sounds normal. No respiratory  distress.  Abdominal: Soft. She  exhibits no distension. There is no tenderness.  Musculoskeletal:  Hematoma on L lateral thigh from midthigh up to buttock. Hematoma on L medial  thigh approx 4 diameter.  Warm extremities, no TPP, palpable pulses in b/l LE.  Skin: Skin is warm and dry.  Psychiatric: She has a normal mood and affect. Her behavior is normal.      Laboratory Data    Recent Labs     08/16/16   1243  08/16/16   1537  WBC  10.6   --  HGB  10.0*  9.4*  HCT  29.5*   --  PLT  293   --  MCV  87.8  88.8  NEUTOPHILPCT  79.0   --  LYMPHOPCT  13.9   --  MONOPCT  5.3   --  EOSPCT  0.5   --  BASOPCT  1.3   --  NEUTROABS  8.4*   --  LYMPHSABS  1.5   --  MONOSABS  0.6   --  EOSABS  0.1   --  BASOSABS  0.1   --   Recent Labs     08/15/16   1619  08/16/16   1243  NA  138  138  K  3.2*  3.7  CHLORIDE  103  106  CO2  22  17*  ANIONGAP  13  15*  BUN  17  19  CREATININE  0.76  0.86  GLU  128*  163*  CALCIUM  9.3  10.3   No results for input(s): POCNA, POCK, POCCL, POCBUN, POCCRE, POCGAP in the last  72 hours.  Recent Labs     08/16/16   1243  PROTIME  12.3  PTT  25.8  INR  1.1    Recent Labs     08/16/16   1243  AST  22  ALT  28  BILITOT  2.7*  ALKPHOS  68  ALB  4.5   Recent Labs     08/16/16   1243  08/16/16   1537  TROPONINI  <0.01  <0.01   No results for input(s): POCTROP, POCCKMB in the last 72 hours.  Lab Results  Component Value Date   BACTERIA Occasional (ABN) 08/16/2016   BILIRUBINUR Negative 08/16/2016   CLARITYU Hazy (ABN) 08/16/2016   COLOR Yellow 08/16/2016   GLUCOSEUR Negative 08/16/2016   KETONESU Negative 08/16/2016   LEUKOUA Negative 08/16/2016   MUCUS Present 08/16/2016   NITRITE Negative 08/16/2016   PHDAU 7.5 08/16/2016   PROTEIN Trace (ABN) 08/16/2016   RBCUA 3-5 (ABN) 08/16/2016   SPECGRAV 1.010 08/16/2016   SQEP 20-50 (ABN) 08/16/2016   UROBILINOGEN 1.0 08/16/2016   WBCUA 20-50 (ABN) 08/16/2016        Diagnostic Studies    Ct-angio Abd & Pel W/wo Cont    Result Date: 08/16/2016  Reason: Anemia, hemorrhage, abdominal pain CT  angiography of the abdomen pelvis  without and with contrast TECHNIQUE: Collimated helical images are made from the  lower lungs through the pubic symphysis without intravenous contrast. Then  collimated helical images are made from the lower lungs through the  pubic  symphysis after 100 mL Omnipaque 350 intravenous contrast. MIP reformatted  images acquired on a separate workstation. Up-to-date CT equipment and radiation  dose reduction techniques were employed. Findings CT angiography of the abdomen  without and with contrast: No pleural or pericardial effusions. There is no  evidence for contrast opacification of bowel loops postcontrast. IVC filter  identified. The celiac artery, superior mesenteric artery, bilateral renal  arteries, inferior mesenteric artery are widely patent. The liver measures 20.4  cm in craniocaudad dimension. The spleen measures 10.6 cm in craniocaudad  dimension. Small hiatal hernia identified with suture identified along the  greater curvature of the stomach. The kidneys, pancreas unremarkable. Findings  CT angiography pelvis: 5.8 mm lymph node identified short axial dimension with  fatty hilum. The region of the gluteus minimus, a 5.4 x 1.3 cm focus of mixed  density is identified. On arterial phase, I do not appreciate any extravasation  into this collection.    IMPRESSION: 1. 5.4 x 11.3 x 11 cm hematoma over the left gluteus minimus region.  No pseudoaneurysm identified at this time. In comparison to a CT performed  yesterday at 1630 hours, this it previously measured 4 x 6 x 7 cm, therefore  this has increased in size. I don't see a definite pseudoaneurysm, however given  interval progression, a formal angiogram may be helpful to further evaluate. 2.  IVC filter 3. Mild nonspecific hepatomegaly Critical results called to Dr.  Francella Solian of [facility] Emergency room at 4:00 PM, 08/16/2016. Signed By:  Jacqulyn Bath MD, Daniel    Ct-lower Extrem W/o Cont Lt    Result Date:  08/15/2016  EXAMINATION: CT of the left femur without contrast INDICATION: Left thigh pain  and bruising TECHNIQUE: CT of the left femur was performed according to standard  protocol. Up-to-date CT equipment and radiation dose reduction techniques were  employed. COMPARISON: Left knee radiographs dated 05/26/2016 FINDINGS: There is  no acute fracture or dislocation. No osseous destruction is seen. Femoral head  alignment is normal. Knee arthroplasty is noted and appears intact. There is a  small suprapatellar effusion. Soft tissue series are limited due to technique  and unable to be corrected at this time. There are two areas of hematoma  measuring 5 cm in the region of the left gluteus medius muscle posterolaterally  and measuring 5 cm more inferiorly in the left gluteus medius muscle extending  towards the gluteus maximus muscle. The superior extent of one of the hematomas  is partially imaged. There is also stranding in the left anterior and medial  compartment of the thigh. There is mild skin thickening laterally at the hip and  proximal thigh which may represent cellulitis.    IMPRESSION: Moderate size hematomas in the left posterolateral hip predominantly  involving the left gluteus medius muscle. No acute osseous abnormality. Prior  knee arthroplasty. Small suprapatellar effusion. Mild inflammatory stranding in  the anterior and medial compartments of the thigh. Signed By: Nicholes Calamity MD, John    Diag-portable Chest    Result Date: 08/16/2016  EXAM: PORTABLE AP CHEST X-RAY, 12:55 PM INDICATION: SOB/CP, anemia, COMPARISON:  Study compared to previous exam 8/14 FINDINGS: HEART / MEDIASTINUM: Normal in  size. LUNGS/PLEURA: Lungs are clear. BONES / SOFT TISSUES: No acute abnormality.  OTHER: None.    IMPRESSION: 1. No acute disease. Signed By: Virgilio Belling MD, Alex      Assessment & Plan    Johnryan Sao VENDELA TROUNG is a 56 y.o. female with Hematoma of thigh, left,  subsequent  encounter. Medical problems being addressed in this encounter  include the  following:    #Hematoma L thigh  - Will NOT do stat CT to r/o PE 2/2 risk of kidney injury from CTA performed  prior to admission on morning 7/15 - must wait 48 hours  - b/l LE venous doppler to be done tomorrow morning to r/o DVT  - will V/Q scan if vitals worsen, currently AFVSS  - q4h neurovascular checks to b/l LE, watch for compartment syndrome  - Serial CBC q6h  - Type and cross  - CPK  - Tylenol, Oxy IR prn for pain. Will continue neurontin and flexeril  - Fall precautions, OOB with assistance  - PT/OT  - Consult vascular  - Consult IR    #Coagulopathy  - Pharmacologic DVT PPX held 2/2 expansion of hematoma  - SCDs to b/l LE, as tol    #Intermittent Fever  - Currently afebrile  - CBC, UA, BCx, lactate    #Dyspnea  - effort-induced  - EKG, CMP, tele  - Continue albuterol and atrovent home meds    #Hypothyroidism  -TSH, T4 WNL, tachycardia not likely drug-induced  -Continue synthroid daily    Discussed with attending, Dr. Hyacinth Meeker      Current Diet Order      NPO DIET  Code Status: Full Code    Signed:  Heywood Bene, DO PGY-1  08/16/2016, 10:03 PM  Pager - [phone]

## 2016-08-16 NOTE — Unmapped (Signed)
--  12 lead electrocardiogram done at this time and presented to ER physician.

## 2016-08-16 NOTE — Unmapped (Signed)
Patient: Wanda Rowe DOB: 1960-12-11 MRN: [id number]    CHIEF COMPLAINT  Chief Complaint  Patient presents with  ?????? Shortness of Breath    HPI  Dante Cooter S Ragan is a 56 y.o. female history of DVT S/P IVC filter and following  with hematology who presents complaining of feeling similarly to yesterday, but  worse. She stopped Lovenox 3 days ago due to a spontaneous hematoma in the left  gluteal/thigh area. There was no specific injury or trauma besides sitting on 2  plane flights about a week ago. She had increased her Lovenox dosage prior to  flying. She had increased bruising and was seen here yesterday and CT scan of  the left lower extremity showed a hematoma. She had a hemoglobin of 7.4 and was  transfused 1 unit PRBCs for symptomatic anemia. This morning she has had some  shortness of breath, chest pressure, dizziness with standing, and rapid heart  rate. She appears pale. Denies bleeding elsewhere, melena, or hematochezia.    No other reported exacerbating or relieving factors or associated symptoms  reported.    REVIEW OF SYSTEMS  At least 10 systems reviewed and negative except as in HPI above.    PAST MEDICAL HISTORY  Past Medical History:  Diagnosis Date  ?????? Anemia   in past  ?????? Anxiety  ?????? Arthritis   knees  ?????? Asthma  ?????? Awareness under anesthesia   woke up during endoscopy  ?????? Back problem   occ back ache  ?????? Cancer (CMS HCC)   right breast- chemo. and radiation in 2014 & 2015, hx of Lumpectomy  ?????? Chest pain   when under stress-testing done & no cause of chest pain was found  ?????? Complication of anesthesia   rxn to succinylcholine-day after surgery, pt  had difficulty w/ muscle  movement-dr j collins in anesthesia dept notified- Informed Dr. Unknown Foley, [facility]  for upcoming procedure on 07/07/16  ?????? Dysplasia  ?????? GERD (gastroesophageal reflux disease)   no meds  ?????? Hernia   hiatal-resolved with wt loss surg  ?????? Hypertension   in past & no longer taking medication  ?????? Irregular heart beat   MVP &  heart murmur-used to take pre-dental antibiotics  ?????? Liver disorder   pt told she has a fatty liver-resolved per pt  ?????? Mobility impaired 07/01/2016   uses cane currently d/t recent s/p left knee replacement on 05/26/16  ?????? Occlusive thrombus 2017   dvts in past; IVC filter inserted 05/22/16  ?????? Osteopenia  ?????? RSD (reflex sympathetic dystrophy)  ?????? Suspected sleep apnea 07/01/2016   pt. states prior to knee replacement when she was given preop meds, she heard  the nurses calling her name and states that something had dropped suddenly: pt.  thinks it may have been her 02. She states she was advised to get a sleep study  in the future. pt. is not sure what med caused this to happen prior to  surgery  on 05/26/16.  ?????? Thyroid disorder   hypothyroidism  ?????? Venous thrombosis and embolism    SURGICAL HISTORY  Past Surgical History:  Procedure Laterality Date  ?????? HX BREAST BIOPSY  july 2014   right  ?????? HX BREAST LUMPECTOMY  02-2013   right; also bilat reduction; back to surg next day for post op bleeding  ?????? HX COLPOSCOPY  ?????? HX FOOT SURGERY   right  ?????? HX GUM SURGERY   done for receding gums  ?????? HX HYSTERECTOMY, TOTAL ABDOMINAL  ??????  HX KNEE SURGERY   left x 3, right x 1  ?????? HX LAPAROSCOPY  ?????? HX OTHER SURGICAL HISTORY  jan 2014   gastric sleeve  ?????? HX OTHER SURGICAL HISTORY   ins. portacath  ?????? HX TOTAL KNEE ARTHROPLASTY Left 05/26/2016  ?????? HX UPPER GI ENDOSCOPY   mult in past due to stomach polyps  ?????? HX VAGINAL DELIVERY  ?????? HX VASCULAR SURGERY  05/22/2016   IVC filter  ?????? REMOVAL OF OVARY/TUBE(S)   bilateral salpingo-oophorectomy      CURRENT MEDICATIONS  Prior to Admission medications  Medication Sig Start Date End Date Taking? Authorizing Provider  albuterol (PROVENTIL HFA) 90 mcg/actuation HFA Aerosol Inhaler Take 2 Puffs by  inhalation every 6 hours as needed.    Provider, Historical  CALCIUM CITRATE PO Take  by mouth daily.    Provider, Historical  CHOLECALCIFEROL, VITAMIN D3, PO Take  by mouth.     Provider, Historical  DULoxetine (CYMBALTA) 60 mg Capsule, Delayed Release(E.C.) Take 60 mg by mouth  daily. Reported on 07/24/2015    Provider, Historical  enoxaparin (LOVENOX) 40 mg/0.4 mL injection 40 mg by Subcutaneous route daily.  Provider, Historical  estrogens, conjugated, (PREMARIN) 0.625 mg/gram Cream Insert into vagina twice a  week. 02/20/16   Arnoldo Hooker., MD  fluticasone (FLONASE) 50 mcg/Actuation NA nasal spray Spray 2 Sprays into nose  daily. Each nostril    Provider, Historical  gabapentin (NEURONTIN) 600 mg tablet Take 600 mg by mouth 3 times daily.  Provider, Historical  ipratropium (ATROVENT) 0.03 % NA nasal spray Spray 2 Sprays into nose 2 times  daily. Each nostril     Provider, Historical  lactobacillus rhamnosus (CULTURELLE) 10 billion cell Take 1 Cap by mouth daily.  Provider, Historical  levothyroxine (SYNTHROID) 75 mcg PO tablet Take 75 mcg by mouth daily.  Provider, Historical  methylPREDNISolone (MEDROL, PAK,) 4 mg tab (dosepak) follow package directions  06/15/16   Hissong, Earlie Server., NP    ALLERGIES  Allergies  Allergen Reactions  ?????? Azithromycin    **DOES NOT WORK**    ?????? Succinylcholine Other (See Comments)    Delayed paralysis  Difficulty w/ muscle movement the day after surgery  Other reaction(s): Muscle Aches  Difficulty w/ muscle movement the day after surgery  ?????? Sulfasalazine Rash  ?????? Tegaderm Rash  ?????? Silver Rash  ?????? Sulfa (Sulfonamide Antibiotics) Swelling and Rash  ?????? Sulfanilamide Rash    Sulfonamides    FAMILY HISTORY  Family History  Problem Relation Age of Onset  ?????? High Blood Pressure Mother  ?????? Diabetes Father  ?????? High Blood Pressure Father  ?????? Heart Problems Paternal Grandfather       mi  ?????? Cancer Other       unspecified grandmother  ?????? Diabetes Other       unspecified grandmother  ?????? Stroke Other       unspecified grandmother  ?????? Colon Cancer Maternal Grandfather 60  ?????? Cancer Maternal Grandmother 60       Bladder  ?????? Anesthesia Complications  Neg Hx      SOCIAL HISTORY  Social History    Social History  ?????? Marital status: Single    Spouse name: N/A  ?????? Number of children: N/A  ?????? Years of education: N/A    Social History Main Topics  ?????? Smoking status: Never Smoker  ?????? Smokeless tobacco: Never Used  ?????? Alcohol use Yes     Comment: very occasionally  ?????? Drug use: No  ??????  Sexual activity: Yes    Partners: Male    Other Topics Concern  ?????? Not on file    Social History Narrative  ?????? No narrative on file        INITIAL PHYSICAL EXAM  Vital Signs: BP 97/65    Pulse (!) 120    Temp 99.1 ??????F (37.3 ??????C) (Oral)    Resp  20    Ht 5' 8 (1.727 m)    Wt 195 lb (88.5 kg)    LMP 04/12/2008    SpO2 100%    BMI 29.65 kg/m??????  Constitutional: Well developed, well nourished, white female in moderately  severe distress, ill appearance  Eyes: PERRL, EOMI, Conjunctiva normal, sclera normal, no discharge  HENT: Head - atraumatic, Ears - bilateral external ears normal, Nose - no  epistaxis, no deformity,  Oropharynx - mucous membranes moist, Neck - supple, no JVD or bruits, no LAD, no  stridor, no nuchal rigidity or meningeal signs  Respiratory: no respiratory distress, clear to auscultation bilaterally  Cardiovascular: Tachycardic heart rate, regular rhythm, no murmurs, rubs, or  gallops  GI: abdomen soft, non tender, non distended, bowel sounds present  GU: no CVA tenderness, no suprapubic tenderness  Rectal: Deferred. Reported negative yesterday.  Musculoskeletal: Extremities - No clubbing, no cyanosis, no edema  No joint redness or warmth. There is extensive ecchymosis of the left buttock  and thigh. +swelling and tenderness. Mild left knee swelling (Hx replacement)  without redness or warmth.  Back - no tenderness  Neurologic: Alert, oriented to person place and time  Normal strength and sensation in all four extremities  Cranial nerves II-XII intact and symmetric, speech clear and fluent, no focal  deficits  Integument: Warm, Dry, No erythema, no rash, +pale,  +slightly diaphoretic  Psychiatric: affect normal, judgement normal, mood normal      EKG (interpreted by me):  Sinus tachycardia rhythm, rate 127, normal intervals and axes, no ischemic  changes or ectopy  Rhythm:  Rate:  PR: 116  QRSD: 84  QTc: 419  No results found for this visit on 08/16/16.      RADIOLOGY:  CT-ANGIO ABD & PEL W/WO CONT  Final Result by Interface, Incoming Radiology Results (07/15 1602)  IMPRESSION:  1. 5.4 x 11.3 x 11 cm hematoma over the left gluteus minimus region. No  pseudoaneurysm identified at this time. In comparison to a CT performed  yesterday at 1630 hours, this it previously measured 4 x 6 x 7 cm, therefore  this has increased in size. I don't see a definite pseudoaneurysm, however given  interval progression, a formal angiogram may be helpful to further evaluate.  2. IVC filter  3. Mild nonspecific hepatomegaly        Critical results called to Dr. Francella Solian of [facility] Emergency room at  4:00 PM, 08/16/2016.    Signed By: Jacqulyn Bath MD, Aida Raider CHEST  Final Result by Interface, Incoming Radiology Results [phone] [id number])  IMPRESSION:    1. No acute disease.    Signed By: Virgilio Belling MD, Alex      LABORATORY:  Labs Reviewed  COMPREHENSIVE METABOLIC PANEL - Abnormal; Notable for the following:      Result Value   CO2 17 (*)   Anion Gap 15 (*)   Glucose 163 (*)   Total Bilirubin 2.7 (*)   All other components within normal limits  CBC (COMPLETE BLOOD COUNT) - Abnormal; Notable for the following:   RBC 3.36 (*)  Hemoglobin 10.0 (*)   Hematocrit Blood 29.5 (*)   RDW 17.5 (*)   All other components within normal limits  DIFFERENTIAL - Abnormal; Notable for the following:   Neutrophils Absolute 8.4 (*)   All other components within normal limits  HEMOGLOBIN (HGB) - Abnormal; Notable for the following:   Hemoglobin 9.4 (*)   All other components within normal limits  URINALYSIS WITH REFLEX TO CULTURE - Abnormal; Notable for the following:   Clarity, UA Hazy (*)   Protein, UA Trace  (*)   Blood, UA Trace (*)   RBC, UA 3-5 (*)   WBC 20-50 (*)   Squam Epithel, UA 20-50 (*)   Bacteria, UA Occasional (*)   All other components within normal limits  ROUTINE URINE CULTURE  PT (PRO TIME INCLUDES INR)  APTT  CARD ENZ (TROPONIN I)  CARD ENZ (TROPONIN I)  OCCULT BLOOD, STOOL      TREATMENTS:  Medications  sodium chloride infusion 0.9% (0 mL Intravenous Stopped 08/16/16 1432)  Iohexol (Omnipaque) 350 mg iodine/mL injection Soln 100 mL (100 mL Intravenous  Given 08/16/16 1415)  sodium chloride infusion 0.9% (1,000 mL Intravenous New Bag 08/16/16 1456)  cefTRIAXone (ROCEPHIN) 1 g in sodium chloride 0.9 % syringe for injection (1 g  Intravenous New Bag 08/16/16 1609)      RESPONSE TO TREATMENT / REPEAT EXAM:  The patient Feels improved after the above treatments. She is still symptomatic  when she stands up. No chest discomfort currently.    PROCEDURES:  None    ED COURSE and MEDICAL DECISION MAKING:  Pertinent Labs & Imaging studies and patient Medical Records reviewed.  (see chart for details)        Vitals:   08/16/16 1400 08/16/16 1432 08/16/16 1436 08/16/16 1439  BP: 102/70 111/50 108/77 114/80  Pulse: 86 78 82 (!) 120  Resp: 20 18 18  (!) 24  Temp:  TempSrc:  SpO2: 100% 100% 100% 100%  Weight:  Height:    Vitals:   08/16/16 1436 08/16/16 1439 08/16/16 1530 08/16/16 1555  BP: 108/77 114/80 107/77 113/55  Pulse: 82 (!) 120  90  Resp: 18 (!) 24 20 18   Temp:  TempSrc:  SpO2: 100% 100%  100%  Weight:  Height:    Vitals:   08/16/16 1439 08/16/16 1530 08/16/16 1555 08/16/16 1636  BP: 114/80 107/77 113/55 107/60  Pulse: (!) 120  90 83  Resp: (!) 24 20 18 20   Temp:    98.7 ??????F (37.1 ??????C)  TempSrc:    Oral  SpO2: 100%  100% 100%  Weight:  Height:            The patient is a 56 year old female with a history of hemorrhagic anemia related  to a spontaneous hematoma of the left gluteal and thigh area. She has been off  anticoagulants ??????3 days. She was symptomatic and orthostatic hypotension arrival,  much improved  after IV fluid bolus. Initially hemoglobin 10 with repeat of 9.4.  Troponin negative ??????2. EKG showed sinus tachycardia but no other acute ischemic  changes. Relative hypotension initially did improve after IV fluid bolus. The  patient did have a bowel movement in the ED which was heme negative. CT  angiogram of the pelvis showed progression of the gluteal hematoma since  yesterday. I did discuss the findings with the radiologist who recommended  formal angiogram and/or embolization tomorrow. I do feel the patient needs to be  observed due to her persistent symptomatic anemia and  chest discomfort and will  transfer to [facility].      4:31 PM I spoke with Dr. Vira Browns hospitalist service who will accept  transfer of the patient for further evaluation and treatment.      Due to the high probability of clinically significant life threating  deterioration of Ms. Luka  condition that required the providers urgent  intervention the total critical care time of 60 minutes.  This time excludes any  time that may have been spent performing procedure.  This includes but not  limited to vital sign monitoring, telemetry monitoring, continuous pulse  oximety, IV medication, clinical response to the IV medications, documentation  time , consultation time, interpretation of lab data, review of nursing notes  and old record review.    IMPRESSION:  Final Diagnosis:  1. Hematoma of gluteus/thigh, left, subsequent encounter  2. Symptomatic anemia  3. Postural hypotension  4. History of blood transfusion  5. Chest tightness or pressure      PLAN:  Transfer to [facility]    This was all discussed with the patient who voiced understanding of and  agreement with the plan and stated that all their questions had been answered  satisfactorily.        MIPS:    Documentation of Current Medications (#130):  ?????? ([id number]) Reviewed      Screening For Hypertension and Follow-up (#317):  BP 113/55    Pulse 90    Temp 99.1 ??????F (37.3 ??????C) (Oral)    Resp  18    Ht 5' 8  (1.727 m)    Wt 195 lb (88.5 kg)    LMP 04/12/2008    SpO2 100%    BMI 29.65  kg/m??????    ?????? Not applicable for this patient      Screening For Tobacco Use and Cessation Intervention (#226):   reports that she has never smoked. She has never used smokeless tobacco.      Please note that some or all of this record was generated using voice  recognition software. If there are any questions about the content of this  document, please contact the author as some errors of transcription may have  occurred.      CHART ELECTRONICALLY SIGNED: Madlyn Frankel, MD 08/16/2016 4:26 PM

## 2016-08-16 NOTE — Unmapped (Signed)
Vascular Surgery Consult      I am asked to evaluate Wanda Rowe by Dr. Particia Nearing Conners, MD for HEMATOMA  OF THIGH, LEFT, SUBSEQUENT ENCOUNTER    Subjective:    Patient is a 56 y.o. female with hx of hereditary clotting disorder, multiple  DVTs in the past, s/p IVC filter 05/22/16 (access via Right common femoral vein)  in preparation for a left total knee arthroplasty 05/26/2016 who is admitted with  a left gluteal hematoma.    Patient first reported pain in left buttock July 6 - onset when she was lifting  and carrying luggage at the airport. The next morning she woke up with severe  pain in her left thigh. Previously treated at [facility] ED with flexeril, medrol and  percocet for a presumed dx of sciatica. Patient reports increased her lovenox  dosing  to 100 mg BID prior to flying, x4 days . She reported bruising onset  July 10.    Presented to [facility] ED 7/14 for increased bruising and symptoms lightheadedness  and palpitations. Transfused 1 unit pRBC for Hg 7.4. Re-presented to ED 7/15  with Hg of 10 to 9.4. CT angio with 5.4x11.3x11 cm hematoma over left gluteus  region without active arterial extravasation.    Lovenox held since 7/12. Patient reports her typical dosing is 40 mg but she has  trouble administering the exact amount and errs towards giving 50 mg.    Currently reports tenderness along left gluteus. Denies numbness/tingling or  motor deficits of lower legs.      Past Medical History:  Diagnosis Date  ?????? Anemia   in past  ?????? Anxiety  ?????? Arthritis   knees  ?????? Asthma  ?????? Awareness under anesthesia   woke up during endoscopy  ?????? Back problem   occ back ache  ?????? Cancer (CMS HCC)   right breast- chemo. and radiation in 2014 & 2015, hx of Lumpectomy  ?????? Chest pain   when under stress-testing done & no cause of chest pain was found  ?????? Complication of anesthesia   rxn to succinylcholine-day after surgery, pt  had difficulty w/ muscle  movement-dr j collins in anesthesia dept notified- Informed Dr.  Unknown Foley, AAC  for upcoming procedure on 07/07/16  ?????? Dysplasia  ?????? GERD (gastroesophageal reflux disease)   no meds  ?????? Hernia   hiatal-resolved with wt loss surg  ?????? Hypertension   in past & no longer taking medication  ?????? Irregular heart beat   MVP & heart murmur-used to take pre-dental antibiotics  ?????? Liver disorder   pt told she has a fatty liver-resolved per pt  ?????? Mobility impaired 07/01/2016   uses cane currently d/t recent s/p left knee replacement on 05/26/16  ?????? Occlusive thrombus 2017   dvts in past; IVC filter inserted 05/22/16  ?????? Osteopenia  ?????? RSD (reflex sympathetic dystrophy)  ?????? Suspected sleep apnea 07/01/2016   pt. states prior to knee replacement when she was given preop meds, she heard  the nurses calling her name and states that something had dropped suddenly: pt.  thinks it may have been her 02. She states she was advised to get a sleep study  in the future. pt. is not sure what med caused this to happen prior to  surgery  on 05/26/16.  ?????? Thyroid disorder   hypothyroidism  ?????? Venous thrombosis and embolism    Past Surgical History:  Procedure Laterality Date  ?????? HX BREAST BIOPSY  july 2014  right  ?????? HX BREAST LUMPECTOMY  02-2013   right; also bilat reduction; back to surg next day for post op bleeding  ?????? HX COLPOSCOPY  ?????? HX FOOT SURGERY   right  ?????? HX GUM SURGERY   done for receding gums  ?????? HX HYSTERECTOMY, TOTAL ABDOMINAL  ?????? HX KNEE SURGERY   left x 3, right x 1  ?????? HX LAPAROSCOPY  ?????? HX OTHER SURGICAL HISTORY  jan 2014   gastric sleeve  ?????? HX OTHER SURGICAL HISTORY   ins. portacath  ?????? HX TOTAL KNEE ARTHROPLASTY Left 05/26/2016  ?????? HX UPPER GI ENDOSCOPY   mult in past due to stomach polyps  ?????? HX VAGINAL DELIVERY  ?????? HX VASCULAR SURGERY  05/22/2016   IVC filter  ?????? REMOVAL OF OVARY/TUBE(S)   bilateral salpingo-oophorectomy    Prescriptions Prior to Admission  Medication Sig Dispense Refill Last Dose  ?????? albuterol (PROVENTIL HFA) 90 mcg/actuation HFA Aerosol  Inhaler Take 2 Puffs by  inhalation every 6 hours as needed.   > Month at Unknown time  ?????? CALCIUM CITRATE PO Take  by mouth daily.   08/15/2016 at Unknown time  ?????? CHOLECALCIFEROL, VITAMIN D3, PO Take  by mouth.   Past Week at Unknown time  ?????? cyclobenzaprine (FLEXERIL) 10 mg tablet Take 10 mg by mouth 3 times daily as  needed for muscle spasm.   Past Week at Unknown time  ?????? DULoxetine (CYMBALTA) 60 mg Capsule, Delayed Release(E.C.) Take 60 mg by mouth  daily. Reported on 07/24/2015   08/16/2016 at Unknown time  ?????? enoxaparin (LOVENOX) 40 mg/0.4 mL injection 40 mg by Subcutaneous route daily.  08/13/2016 at Unknown time  ?????? estrogens, conjugated, (PREMARIN) 0.625 mg/gram Cream Insert into vagina twice  a week. 42.5 g 1 > Month at Unknown time  ?????? fluticasone (FLONASE) 50 mcg/Actuation NA nasal spray Spray 2 Sprays into nose  daily. Each nostril   08/16/2016 at Unknown time  ?????? gabapentin (NEURONTIN) 600 mg tablet Take 600 mg by mouth 2 times daily.  08/16/2016 at Unknown time  ?????? ipratropium (ATROVENT) 0.03 % NA nasal spray Spray 2 Sprays into nose 2 times  daily. Each nostril    08/16/2016 at Unknown time  ?????? lactobacillus rhamnosus (CULTURELLE) 10 billion cell Take 1 Cap by mouth  daily.   Past Week at Unknown time  ?????? levothyroxine (SYNTHROID) 75 mcg PO tablet Take 66 mcg by mouth daily.   Past  Week at Unknown time  ?????? oxyCODONE-acetaminophen (PERCOCET) 5-325 mg per tablet Take 1-2 Tabs by mouth  every 6 hours as needed for Pain.   Past Week at Unknown time    Allergies  Allergen Reactions  ?????? Azithromycin    **DOES NOT WORK**    ?????? Succinylcholine Other (See Comments)    Delayed paralysis  Difficulty w/ muscle movement the day after surgery  Other reaction(s): Muscle Aches  Difficulty w/ muscle movement the day after surgery  ?????? Sulfasalazine Rash  ?????? Tegaderm Rash  ?????? Silver Rash  ?????? Sulfa (Sulfonamide Antibiotics) Swelling and Rash  ?????? Sulfanilamide Rash    Sulfonamides    Social  History  Substance Use Topics  ?????? Smoking status: Never Smoker  ?????? Smokeless tobacco: Never Used  ?????? Alcohol use Yes     Comment: very occasionally    Family History  Problem Relation Age of Onset  ?????? High Blood Pressure Mother  ?????? Diabetes Father  ?????? High Blood Pressure Father  ?????? Heart Problems Paternal  Grandfather       mi  ?????? Cancer Other       unspecified grandmother  ?????? Diabetes Other       unspecified grandmother  ?????? Stroke Other       unspecified grandmother  ?????? Colon Cancer Maternal Grandfather 60  ?????? Cancer Maternal Grandmother 60       Bladder  ?????? Anesthesia Complications Neg Hx      Review of Systems  Please see HPI for the pertinent ROS, a complete ROS have been reviewed and all  others negative.      Objective Data:    Blood pressure 124/58, pulse 78, temperature 99.3 ??????F (37.4 ??????C), temperature  source Oral, resp. rate 18, height 5' 8 (1.727 m), weight 195 lb 4 oz (88.6  kg), last menstrual period 04/12/2008, SpO2 100 %, not currently breastfeeding.    Intake/Output Summary (Last 24 hours) at 08/16/16 2230  Last data filed at 08/16/16 1909   Gross per 24 hour  Intake             2000 ml  Output                0 ml  Net             2000 ml      Physical Exam:  GEN:  alert, well appearing, and in no distress  HEENT:  pupils equal and reactive, extraocular eye movements intact, oropharynx  and nasopharynx clear.  No LAD.  NEURO:  alert, oriented, normal speech, no focal findings or movement disorder  noted  CARDIO:  normal rate, regular rhythm, normal S1, S2, no murmurs, rubs, clicks or  gallops  RESP:  clear to auscultation, no wheezes, rales or rhonchi, symmetric air entry,  no tachypnea, retractions or cyanosis  GI: soft, nontender, nondistended, no masses or organomegaly. Superficial  bruising in lower abdomen from lovenox shots  MUSCULOSKELETAL: No deformities. Tenderness along left gluteus hematoma with  bruising. No evidence of skin necrosis  SKIN: superficial bruising along left  lateral thigh, medial left thigh and left  popliteal fossa. No hematoma/mass appreciated along these areas.  PV: palpable bilateral DP pulses. Biphasic bilateral PT pulses. No skin mottling  of distal lower legs. Sensation and motor function intact.    Data Review  Recent Labs     08/15/16   1619  08/16/16   1243  08/16/16   1537  WBC  8.4  10.6   --  HGB  7.4*  10.0*  9.4*  HCT  22.0*  29.5*   --  MCV  90.2  87.8  88.8  PLT  266  293   --    Recent Labs     08/15/16   1619  08/16/16   1243  NA  138  138  K  3.2*  3.7  CO2  22  17*  BUN  17  19  CREATININE  0.76  0.86    Lab Results  Component Value Date   PROTIME 12.3 08/16/2016   INR 1.1 08/16/2016    Lab Results  Component Value Date   PTT 25.8 08/16/2016    Results for orders placed during the hospital encounter of 02/20/16  VASCI-VEN DUPLEX LE LTD LT   Narrative FINAL IMPRESSIONS  1. There is no evidence of deep or superficial venous thrombosis in the   left lower extremity.  Full report view including tables and additional findings is available in   the link  below.      Assessment/Plan    ASSESSMENT:  56 year old woman with left gluteal hematoma while on lovenox, with recent hx of  supratherapeutic dosing.    PLAN:  No signs/symptoms concerning for compartment syndrome. No evidence of skin  necrosis. Bruising around lateral and medial thigh likely from superficial  spread from the thigh with no hematoma appreciated on exam or CT imaging.  Continue holding lovenox, trend CBCs  Will monitor. No role for emergent intervention at this time.    Discussed with attending.    Donley Redder, MD  Surgery resident  Vascular Surgery

## 2016-08-17 NOTE — Unmapped (Signed)
Inpatient Occupational Therapy Consult Note    Recommendations  Assessment  Assessment Complete?: Yes  Deficits: Impaired activity tolerance  Rehab Potential: Excellent  Patient Strengths: Motivation;Family support;Supportive discharge  environment;Home set-up;Prior level of function  Barriers to Goal Achievement: Medical Status  Goal Formulation: Pt/family goal (specify) (go home)  No Skilled OT: Safe To Return Home;No Acute OT Goals Identified    Plan  OT Frequency: One Time Visit    Recommendations  Recommendations: Home independently  Equipment: Pt owns needed occupational therapy DME      Assessment  Precautions  Activity Level:  (ok to work with per RN)  Isolation: None  Cardiac: Telemetry  Other Precautions: thigh hematoma    Home Living  Type of Home: House  Home Layout: Multi-Level  Entrance: Stairs  # of stairs to enter: 2  Railings Present at Entrance: Right  Total # of Floors: 2 Floors  Total # of Stairs: 14  Railings Present: Right  Bathroom Shower/Tub: Pension scheme manager: Standard  Home Equipment: Musician;Shower Chair    Prior Function  Level of Independence: Independent with functional mobility with AD;Independent  with ADLs;Independent Homemaking  Assistive Devices: Recruitment consultant  Lives With: Alone (dtr is going to college soon)  Receives Help From:  (none needed PTA)  Homemaking Responsibilities: Meal  Prep;Laundry;Vacuuming;Cleaning;Gardening;Diona Fanti;Shopping  Vocational: Full Time Employment    ADL  Where Assessed: Edge of Bed  Eating Assistance: Independent  Grooming Assistance: Supervision  Bathing Assist: Supervision  Bathing Deficit: Setup;Supervision/Safety  UE Dressing Assist: Supervision  UE Dressing Deficit: Setup  LE Dressing Assist: Supervision  LE Dressing Deficit: Setup;Supervision/safety  Toileting Assist: Supervision  Toileting Deficit: Supervison/Safety    Bed Mobility  Supine to Sit: Supervision  Sit to Supine: Supervision  Sit to Stand:  Supervision  Bed to Chair: Supervision;Via Ambulation    Functional Transfers  Toilet Transfers: Supervision;Via ambulation  Shower Transfers: Supervision;Via Orthoptist Transfers: Supervision;Via Ambulation    Vision and Hearing  Current Vision: No visual deficits  Hearing: WFL        Cognition  Overall Cognitive Status: Does not interfere with functional mobility or ADL  Arousal/Alertness: Alert  Behavior: Cooperative;Pleasant;Appropriate  Attention Span: Appears intact  Orientation: Person;Place;Time;Situation  Following Commands: Follows multi-step commands w/out difficulty  Initiation/Sequencing/Organization: Appears intact  Safety Judgement: Good awareness of safety precautions  Problem Solving: Able to problem solve independently    Perseveration  Perseveration: Not present    Praxis/Motor Planning  Praxis/ Motor Planning: Functional for ADL    Coordination  Coordination: WDL  Coordination comments: Functional    Light Touch  RUE: No apparent deficit  LUE: No apparent deficit        Proprioception  RUE: No apparent deficit  LUE: No apparent deficit    Balance  Sitting-Static: Good - Maintains balance with moderate challenges from all  directions  Sitting-Dynamic: Good - Maintains balance through moderate excursions of active  trunk movement  Standing-Static: Fair+ - Maintains balance with minimal challenges from all  directions  Standing-Dynamic: Fair+ - Maintains balance through minimal excursions of active  truck movement    RUE Assessment  RUE Assessment: ROM and Strength WFL    LUE Assessment  LUE Assessment: ROM and Strength WFL    Hand Function  Gross Grasp: Functional  Hand Coordination: WFL    Pain Information  Pain Assessment (VERBAL)  Q Caregiver Change and PRN  Patient Currently in Pain (Verbal & Non-Verbal): Denies  Recommendations  Assessment  Assessment Complete?: Yes  Deficits: Impaired activity tolerance  Rehab Potential: Excellent  Patient Strengths: Motivation;Family  support;Supportive discharge  environment;Home set-up;Prior level of function  Barriers to Goal Achievement: Medical Status  Goal Formulation: Pt/family goal (specify) (go home)  No Skilled OT: Safe To Return Home;No Acute OT Goals Identified    Plan  OT Frequency: One Time Visit    Recommendations  Recommendations: Home independently  Equipment: Pt owns needed occupational therapy DME    Pt is functioning at or near baseline. Pt is being D/C'd from OT services at  this time. No DME ordered.    Occupational Therapy Evaluation Charge Reference  Occupational Profile & Medical History:  Refer to assessment section above including precautions, home living, and prior  function.  Brief history Low x  Expanded review Mod  Extensive review High  Assessments of Occupational Performance:  Refer to assessment section above including ADL, bed mobility, functional  transfers, vision & hearing, visual perceptual assessment, cognition,  perseveration, motor planning, coordination, touch, proprioception, balance,  ROM/MMT and hand function.  1-3 performance deficits Low x  3-5 performance deficits Mod  5 or more performance deficits High  Clinical Decision Making:  Refer to recommendations section above including assessment, plan, and  recommendations.  Low analytical complexity, limited treatment options, no assessment  modifications, no co-morbidities Low x  Moderate analytical complexity, min-mod assessment modifications, mod treatment  options, may have co-morbidities Mod  High analytical complexity, comprehensive assessments, multiple treatment  options, significant modifications of assessments, co-morbidities that affect  performance High     Charge: 1 low Eval (30 minutes)    Therapist: Mamie Laurel. Piepmeyer, OTR/L  Date: 08/17/2016

## 2016-08-17 NOTE — Unmapped (Signed)
--------------------------------------------------------------------------------  Attestation signed by Emeline General., MD at 08/17/16 2018  I have seen and examined the patient in conjunction with the resident team. I  agree with the resident findings, interpretation of data and management plan as  documented in their note linked to mine. Findings have been communicated to  consulting and/or primary services. Any revisions are noted below.  56 year old woman with left gluteal hematoma while on lovenox, with recent hx  of supratherapeutic dosing, recently s/p left knee replacement  H/H stable today  Left gluteal area with bruising, stable  Unfortunately she has developed a left leg tibial vein DVT while on  anticoagulation. IVC filter in place.  Given that her h/h is stable, would recommend starting heparin tomorrow, if H/H  continues to remain stable, then would recommend starting oral anticoagulation  the next day.  No reason for CT at this time, CTPA negative for PE, I suspect her shortness of  breath and dizziness/tachycardia is from symptomatic anemia.  --------------------------------------------------------------------------------    VASCULAR SURGERY PROGRESS NOTE    Admit Date: 08/16/2016    Subjective:    No acute events overnight.      Objective:    Patient Vitals for the past 8 hrs:   BP Temp Temp src Pulse Resp SpO2  08/17/16 0310 116/58 98.3 ??????F (36.8 ??????C) Oral 81 18 99 %  08/16/16 2311 119/63 97.3 ??????F (36.3 ??????C) Oral 89 18 100 %      Intake/Output Summary (Last 24 hours) at 08/17/16 0552  Last data filed at 08/17/16 0100   Gross per 24 hour  Intake             2060 ml  Output               30 ml  Net             2030 ml      General: NAD  Resp: normal respiratory effort on room air  CV: RRR, normotensive  Abdomen: soft, nontender, nondistended, no rebound tenderness, no guarding  Neuro: alert and oriented x 3  MUSCULOSKELETAL: No deformities. Tenderness along left gluteus hematoma with  bruising.  No evidence of skin necrosis  SKIN: superficial bruising along left lateral thigh, medial left thigh and left  popliteal fossa. No hematoma/mass appreciated along these areas.  PV: palpable bilateral DP pulses. Biphasic bilateral PT pulses. No skin mottling  of distal lower legs. Sensation and motor function intact.           Data Review  CBC:  Lab Results  Component Value Date   WBC 7.3 08/17/2016   RBC 2.73 (LOW) 08/17/2016   HGB 8.3 (LOW) 08/17/2016   HCT 24.5 (LOW) 08/17/2016   HCT 33 10/08/2012   PLT 180 08/17/2016    BMP:  Lab Results  Component Value Date   GLU 121 (HIGH) 08/17/2016   NA 137 08/17/2016   K 4.2 08/17/2016   CHLORIDE 108 08/17/2016   CO2 20 (LOW) 08/17/2016   BUN 23 08/17/2016   CREATININE 0.68 08/17/2016   CALCIUM 9.4 08/17/2016    Coagulation:  Lab Results  Component Value Date   INR 1.1 08/16/2016        Assessment:    Principal Problem:    Hematoma of thigh, left, subsequent encounter  Active Problems:    Clotting disorder (CMS HCC)    History of total knee replacement (TKR)      56 year old woman with left gluteal hematoma  while on lovenox, with recent hx of  supratherapeutic dosing.    Plan:    - No signs/symptoms of compartment syndrome  - No active extravasation on CT  - Continue holding lovenox and trend CBC  - Ecchymosis of gluteal region and thigh stable  - No vascular intervention at this time.    Ma Rings, MD  Vascular Surgery  Pager [phone]

## 2016-08-17 NOTE — Unmapped (Signed)
Medical Oncology/Hematology    Hospital  LOS: 1 day  Code Status: Full Code  Primary Oncologist: Magdalene River, MD    Oncology/hematology and Treatment Plan Update for 08/17/2016  Hx/new DVT: pt had a IVC filter placed in April due to multiple knee surgeries  and had been on and off anticoagulants. She did have a hypercoagulable work up  earlier this y    New large hematoma to left hip/sacrum with likely supratherapeutic dosing    Anemia, likely related to  left gluteal hematoma while on lovenox    Disposition Plan  Per attending      Subjective  Subjective:  CC: left leg bruising and back pain    HPI: Wanda Rowe was admitted for anemia and hematoma from supratherapeutic doses  of lovenox. Pt states she has been off and on lovenox with dose adjustments  since April this year. She was on xarelto when she was first diagnosed in 09/2015  but in 01/2016 when she was preparing for knee surgery she had dopplers and  showed her DVT was resolving. She was then placed on SQ lovenox. She had a IVC  filter placed in March 2018. She then had knee surgery was off lovenox prior to  and then restarted after her aurgery on a prophylactic dose that evening then  bid for 2 weeks. Its unclear what dose she actually was taking as she states  that the surgeon and Dr Selena Batten had different thoughts on treatment. She says that  she was squirting half a syringe out and giving herslf that amount daily. She  says she was traveling and dosed herself with 100 mq sq BID 2 days before and 1  day after. She said that when she was getting off the plan her left leg and her  back was painful. She was seen in the ED and  had a hematoma and was given  prednisone. The next day she saw her PCP and they recommended she stop her  anticoagulation and follow up with Dr. Selena Batten. She became short of beath and was  seen in the ED and given a unit of blood. The pain and bruising worsened so she  went to to the ED and was admitted.    Current Pain Level: denies  ROS 10  point systems reviewed and pertinent findings are in the HPI        Current Medications:      DULoxetine 60 mg Oral Daily  gabapentin 600 mg Oral BID  levothyroxine 75 mcg Oral Daily    acetaminophen 650 mg Oral Q6H PRN  albuterol 2 Puff Inhalation Q6H PRN  atropine 0.5 mg Intravenous Q5 Min PRN  cyclobenzaprine 5 mg Oral TID PRN  nitroglycerin 0.4 mg Sublingual Q5 Min PRN  oxyCODONE 5 mg Oral Q4H PRN          General Multisystem Physical Exam  Patient Vitals for the past 24 hrs:   BP Temp Temp src Pulse Resp SpO2 Height Weight  08/17/16 1157 114/56 98.2 ??????F (36.8 ??????C) Oral 87 16 100 % - -  08/17/16 0733 118/48 98.6 ??????F (37 ??????C) Oral 87 16 100 % - -  08/17/16 0500 - - - - - - - 195 lb 6.4 oz (88.6 kg)  08/17/16 0310 116/58 98.3 ??????F (36.8 ??????C) Oral 81 18 99 % - -  08/16/16 2311 119/63 97.3 ??????F (36.3 ??????C) Oral 89 18 100 % - -  08/16/16 2120 124/58 99.3 ??????F (37.4 ??????C) Oral 78 18 100 %  5' 8 (1.727 m) 195 lb 4  oz (88.6 kg)  08/16/16 1952 113/67 - - 80 18 100 % - -  08/16/16 1950 - 98.1 ??????F (36.7 ??????C) Oral - - - - -  08/16/16 1921 121/65 - - 73 16 100 % - -  08/16/16 1900 118/59 - - 76 (!) 22 100 % - -  08/16/16 1830 - - - (!) 144 (!) 22 100 % - -  08/16/16 1800 102/69 - - 84 (!) 22 100 % - -  08/16/16 1730 93/64 - - 86 19 100 % - -  08/16/16 1636 107/60 98.7 ??????F (37.1 ??????C) Oral 83 20 100 % - -  08/16/16 1555 113/55 - - 90 18 100 % - -  08/16/16 1530 107/77 - - - 20 - - -  08/16/16 1439 114/80 - - (!) 120 (!) 24 100 % - -  08/16/16 1436 108/77 - - 82 18 100 % - -  08/16/16 1432 111/50 - - 78 18 100 % - -  08/16/16 1400 102/70 - - 86 20 100 % - -  08/16/16 1330 98/59 - - 98 (!) 21 100 % - -  08/16/16 1301 103/65 - - (!) 118 18 100 % - -    Most Recent Weight: 195 lb 6.4 oz (88.6 kg)  Initial Weight: 195 lb (88.5 kg)  Body mass index is 29.71 kg/m??????.    Cardiac Rhythm: Normal sinus rhythm  Oxygen Delivery: O2 Delivery Device: None (Room air), O2 Flow Rate (l/min): 0  l/min,      Intake/Output Summary (Last  24 hours) at 08/17/16 1252  Last data filed at 08/17/16 1204   Gross per 24 hour  Intake             2060 ml  Output              280 ml  Net             1780 ml      General Appearance: Well-developed, not acutely ill  External Ears, nose, and lips  normal  Lungs: Clear, no use of accessory muscles  CV:RRR no MGR.  Abd: Soft, NT/ND.  No HSM. No ascites  Ext: No CC. Left leg +1 swelling. Left hip,buttocks, posterior thigh, and  posterior knee, and inner thigh with large resolving ecchymosis/hematoma  Psychiatric: Normal insight/judgement. Oriented to person, place and time  Skin: No rashes or petechia to inspection.  No nodules on palpation.    ECOG Performance Status: (1) Restricted in physically strenuous activity,  ambulatory and able to do work of light nature      Data Reviewed    Laboratory Data  CBC:  Recent Labs     08/15/16   1619  08/16/16   1243  08/16/16   1537  08/17/16   0029  08/17/16   0510  WBC  8.4  10.6   --   9.3  7.3  HGB  7.4*  10.0*  9.4*  8.5*  8.3*  HCT  22.0*  29.5*   --   24.9*  24.5*  MCV  90.2  87.8  88.8  89.5  89.6  PLT  266  293   --   194  180  NEUTOPHILPCT  73.8  79.0   --   75.4   --  LYMPHOPCT  18.8  13.9   --   16.6   --  MONOPCT  6.5  5.3   --  7.1   --  EOSPCT  0.5  0.5   --   0.6   --  BASOPCT  0.4  1.3   --   0.3   --  NEUTROABS  6.2  8.4*   --   7.0   --  LYMPHSABS  1.6  1.5   --   1.5   --  MONOSABS  0.5  0.6   --   0.7   --  EOSABS  0.0  0.1   --   0.1   --  BASOSABS  0.0  0.1   --   0.0   --    Renal/Hepatic:  Recent Labs     08/16/16   1243  08/17/16   0029  08/17/16   0510  NA  138  136  137  K  3.7  3.9  4.2  CO2  17*  17*  20*  BUN  19  22  23   CREATININE  0.86  0.70  0.68  GLU  163*  123*  121*  CALCIUM  10.3  9.3  9.4  BILITOT  2.7*   --    --  AST  22   --    --  ALT  28   --    --  ALKPHOS  68   --    --  PROT  7.5   --    --  ALB  4.5   --    --    Invalid input(s): 3    Recent Micro Results (Last 14 days):  No results found for this or any previous visit (from  the past 336 hour(s)).    Recent Radiology Results:  Results in EPIC reviewed

## 2016-08-17 NOTE — Unmapped (Signed)
Internal Medicine Resident Progress Note    Resident: [name], MD  Attending: Dr. Celine Mans    Subjective:  Chief Complaint SOB, Left Extremity Pain    Interval History  Pt seen and examined.  - Pt does not have any symptoms at rest  - Pt can experience dizziness, SOB, tachycardia, and clamminess upon  standing/moving around  - Pt has pain in her left thigh and left buttocks  - Pt denies chest pain, sob at rest      Review of Systems negative except for above        Objective:  Current Vitals  BP 106/58    Pulse 86    Temp 98.4 ??????F (36.9 ??????C) (Oral)    Resp 16    Ht 5' 8  (1.727 m)    Wt 195 lb 6.4 oz (88.6 kg)    LMP 04/12/2008    SpO2 100%    BMI  29.71 kg/m??????  Recent Weights:   08/16/16 1216 08/16/16 2120 08/17/16 0500  Weight: 195 lb (88.5 kg) 195 lb 4 oz (88.6 kg) 195 lb 6.4 oz (88.6 kg)      Physical Exam  General: laying in bed, not in acute distress  Head: atraumatic, normocephalic  Heart: RRR, no murmurs, rubs or gallops  Lungs: Clear to auscultation bilaterally, no wheezes or crackles  Abdomen: soft, non-tender, normal bowel sounds  Extremities: left calf slightly larger in diameter compared to right,  non-erythematous, no pitting edema  Neuro: moves all 4 limbs spontaneously, CN 2-12 grossly intact, A and O x3      Data  Data Reviewed  Lab Results  Component Value Date   NA 137 08/17/2016   K 4.2 08/17/2016   CL 100 10/08/2012   CO2 20 (LOW) 08/17/2016   BUN 23 08/17/2016   CREATININE 0.68 08/17/2016   GLU 121 (HIGH) 08/17/2016   WBC 7.3 08/17/2016   HGB 8.3 (LOW) 08/17/2016   HCT 24.5 (LOW) 08/17/2016   HCT 33 10/08/2012   PLT 180 08/17/2016   INR 1.1 08/16/2016    Lab Results  Component Value Date   POCGMD 106 (HIGH) 05/26/2016   POCGMD 95 11/28/2013   POCGMD 208 (HIGH) 02/09/2013      Imaging    CT angiography of the chest with contrast  08/17/2016    IMPRESSION:  1. No evidence for pulmonary embolism  2. Small hiatal hernia  3. Vicarious excretion of contrast versus cholelithiasis and  nondistended  gallbladder. An abdominal ultrasound could be performed to evaluate for  the possibility of gallstones, if any clinical indication for it.  Signed By: Jacqulyn Bath MD, George Ina Abd & Pel W/wo Cont    Result Date: 08/16/2016  Reason: Anemia, hemorrhage, abdominal pain CT angiography of the abdomen pelvis  without and with contrast TECHNIQUE: Collimated helical images are made from the  lower lungs through the pubic symphysis without intravenous contrast. Then  collimated helical images are made from the lower lungs through the pubic  symphysis after 100 mL Omnipaque 350 intravenous contrast. MIP reformatted  images acquired on a separate workstation. Up-to-date CT equipment and radiation  dose reduction techniques were employed. Findings CT angiography of the abdomen  without and with contrast: No pleural or pericardial effusions. There is no  evidence for contrast opacification of bowel loops postcontrast. IVC filter  identified. The celiac artery, superior mesenteric artery, bilateral renal  arteries, inferior mesenteric artery are widely patent. The liver measures 20.4  cm in craniocaudad dimension. The spleen measures 10.6 cm in craniocaudad  dimension. Small hiatal hernia identified with suture identified along the  greater curvature of the stomach. The kidneys, pancreas unremarkable. Findings  CT angiography pelvis: 5.8 mm lymph node identified short axial dimension with  fatty hilum. The region of the gluteus minimus, a 5.4 x 1.3 cm focus of mixed  density is identified. On arterial phase, I do not appreciate any extravasation  into this collection.    IMPRESSION: 1. 5.4 x 11.3 x 11 cm hematoma over the left gluteus minimus region.  No pseudoaneurysm identified at this time. In comparison to a CT performed  yesterday at 1630 hours, this it previously measured 4 x 6 x 7 cm, therefore  this has increased in size. I don't see a definite pseudoaneurysm, however given  interval progression, a  formal angiogram may be helpful to further evaluate. 2.  IVC filter 3. Mild nonspecific hepatomegaly Critical results called to Dr.  Francella Solian of [facility] Emergency room at 4:00 PM, 08/16/2016. Signed By:  Jacqulyn Bath MD, Daniel    Ct-lower Extrem W/o Cont Lt    Result Date: 08/15/2016  EXAMINATION: CT of the left femur without contrast INDICATION: Left thigh pain  and bruising TECHNIQUE: CT of the left femur was performed according to standard  protocol. Up-to-date CT equipment and radiation dose reduction techniques were  employed. COMPARISON: Left knee radiographs dated 05/26/2016 FINDINGS: There is  no acute fracture or dislocation. No osseous destruction is seen. Femoral head  alignment is normal. Knee arthroplasty is noted and appears intact. There is a  small suprapatellar effusion. Soft tissue series are limited due to technique  and unable to be corrected at this time. There are two areas of hematoma  measuring 5 cm in the region of the left gluteus medius muscle posterolaterally  and measuring 5 cm more inferiorly in the left gluteus medius muscle extending  towards the gluteus maximus muscle. The superior extent of one of the hematomas  is partially imaged. There is also stranding in the left anterior and medial  compartment of the thigh. There is mild skin thickening laterally at the hip and  proximal thigh which may represent cellulitis.    IMPRESSION: Moderate size hematomas in the left posterolateral hip predominantly  involving the left gluteus medius muscle. No acute osseous abnormality. Prior  knee arthroplasty. Small suprapatellar effusion. Mild inflammatory stranding in  the anterior and medial compartments of the thigh. Signed By: Nicholes Calamity MD, John    Diag-portable Chest    Result Date: 08/16/2016  EXAM: PORTABLE AP CHEST X-RAY, 12:55 PM INDICATION: SOB/CP, anemia, COMPARISON:  Study compared to previous exam 8/14 FINDINGS: HEART / MEDIASTINUM: Normal in  size. LUNGS/PLEURA: Lungs are clear. BONES /  SOFT TISSUES: No acute abnormality.  OTHER: None.    IMPRESSION: 1. No acute disease. Signed By: Virgilio Belling MD, Alex        Microbiology  Blood cultures drawn on 7/16: in process  Urine Culture from 7/15: in process          Assessment/Plan:    #Hematoma L thigh  - Hematoma appears to be regressing according to pen outlines  - Venous doppler on 7/16 showed left calf DVT  - CTA of chest did not show pulmonary embolism  - q4h neurovascular checks to b/l LE, watch for compartment syndrome  - Serial H/H q6h  - CPK --> CK 42  - Tylenol, Oxy IR prn for pain. Will continue neurontin and flexeril  -  Fall precautions, OOB with assistance  - PT/OT  - Vascular consulted, see note  - IR consulted, will consider CT angio lower left leg for 7/17     #Coagulopathy  - Pharmacologic DVT PPX held 2/2 expansion of hematoma  - SCDs to b/l LE, as tolerated  - IVC filter still in place from prior procedure    #Anemia:  - H/H q6hr to monitor for active bleeding  - 8.3 on 7/16    #Dyspnea  - effort-induced  - Continue albuterol and atrovent home meds     #Hypothyroidism  -TSH, T4 WNL, tachycardia not likely drug-induced  -Continue synthroid daily    #Hx of (R) Breast Cancer:  - may induce hypercoagulable state  - s/p lumpectomy, chemo, radiation 2014/2015      DVT PPx: SCD    Disposition: 6 south      Copely, Luci Bank., MD  Internal Medicine Resident, PGY-1  Pager # [phone]    Discussed with attending physician, Dr. Celine Mans

## 2016-08-17 NOTE — Unmapped (Signed)
Inpatient Physical Therapy Consult Note  S:  Does this mean I don't get to go home today?    Noted new DVT in LLE however pt has IVC filter, RN ok to work with pt.    Did not provide any TE this date on LLE until plan of care is established for  DVT.    Pre activity HR 83  Standing HR 104  After walking 50 ft HR 130 and pt symptomatic. RN aware.    Recommendations  Assessment  Assessment Complete?: Yes  Deficits: Decreased LE Strength;Impaired activity tolerance;Impaired  transfers;Impaired gait;Impaired balance  Rehab Potential: Good  Patient Strengths: Prior level of function;Motivation  Barriers to Goal Achievement: Decreased functional activity tolerance  Goal Formulation: Pt/family goal (specify) (to go home)    Plan  Treatment Interventions: Bed Mobility;Art gallery manager;Therapeutic  exercises;Endurance Training;Balance;Patient/Family Training;Equipment  Eval/Education;Gait Training;Neuromuscular reeducation  PT Frequency: 5x/wk    Recommendations  Recommendations: Intermittent supervision;Outpatient PT  Equipment Recommended: Pt owns recommended PT DME      Assessment  Precautions  Activity Level:  (ok to work with per RN)  Isolation: None  Cardiac: Telemetry    Home Living  Type of Home: House  Home Layout: Multi-Level  Entrance: Stairs  # of stairs to enter: 2  Railings Present at Entrance: Right  Total # of Floors: 2 Floors  Total # of Stairs: 14  Railings Present: Right  Home Equipment: Musician;Shower Chair    Prior Function  Level of Independence: Independent with functional mobility with AD;Independent  with ADLs;Independent Homemaking  Assistive Devices: Recruitment consultant (last few days to left thigh hematoma)  Lives With: Alone (dtr is going to college soon)  Vocational: Full Time Employment    Bed Mobility  Supine to Sit: Supervision  Sit to Supine: Supervision  Sit to Stand: Supervision        Gait  Pattern: Decreased stance time L  Gait Assistance: SBA  Assistive  Device: Straight cane  Distance (ft): 50 Feet  Additional Comments: HR up to 130 with activity and pt feeling sweaty and slight  HA, eval stopped    Balance  Sitting-Static: Good - Maintains balance with moderate challenges from all  directions  Sitting-Dynamic: Good - Maintains balance through moderate excursions of active  trunk movement  Standing-Static: Fair+ - Maintains balance with minimal challenges from all  directions  Standing-Dynamic: Fair+ - Maintains balance through minimal excursions of active  truck movement    Coordination  Coordination: No gross deficits noted  Coordination comments: Functional    Activity Tolerance  Functional Activity Tolerance: Endurance does not limit participation        Cognition  Overall Cognitive Status: Does not interfere with functional mobility or ADL  Arousal/Alertness: Alert  Behavior: Cooperative;Pleasant;Appropriate  Attention Span: Appears intact  Orientation: Person;Place;Time;Situation  Following Commands: Follows multi-step commands w/out difficulty  Initiation/Sequencing/Organization: Appears intact  Safety Judgement: Good awareness of safety precautions  Problem Solving: Able to problem solve independently    Light Touch  RLE: No apparent deficit  LLE: No apparent deficit    RLE Assessment  RLE Assessment: ROM and Strength WFL    LLE Assessment  LLE Assessment: Abnormal  Strength - LLE  L Hip Flexion: 4+/5 Part moves through complete ROM Against gravity / moderate  to strong resistance  L Knee Extension: 4+/5 Part moves through complete ROM Against gravity /  moderate to strong resistance    Pain Information  Pain Assessment (VERBAL)  Q Caregiver Change  and PRN  Patient Currently in Pain (Verbal & Non-Verbal): Denies        Recommendations  Assessment  Assessment Complete?: Yes  Deficits: Decreased LE Strength;Impaired activity tolerance;Impaired  transfers;Impaired gait;Impaired balance  Rehab Potential: Good  Patient Strengths: Prior level of  function;Motivation  Barriers to Goal Achievement: Decreased functional activity tolerance  Goal Formulation: Pt/family goal (specify) (to go home)    Plan  Treatment Interventions: Bed Mobility;Art gallery manager;Therapeutic  exercises;Endurance Training;Balance;Patient/Family Training;Equipment  Eval/Education;Gait Training;Neuromuscular reeducation  PT Frequency: 5x/wk    Recommendations  Recommendations: Intermittent supervision;Outpatient PT  Equipment Recommended: Pt owns recommended PT DME    Goals to be met by 08/24/16:    1. Bed mobility: Mod I  2. Sit to stand: mod I  3. Gait X 200 ft with SPC mod I  4. Up and down 15 steps with 1 HR CGA  5. Tolerate 20 reps of TKR exercises    Physical Therapy Evaluation Charge Reference    History ?????? refer to precautions, prior level of function, home set up and  cognition sections above  No personal factors and/or comorbidities Low  1-2 personal factors and/or comorbidities Moderate  3 or more personal factors and/or comorbidities High X  Examination of body systems ?????? refer to bed mobility, gait, wheelchair mobility,  balance, coordination, vision/hearing and LE assessment (strength, light touch  and proprioception) sections above  Of body system(s) using standardized tests and measures addressing 1-2 elements  from any of the following: body structures and functions, activity limitations,  and/or participation restrictions Low  Of body system(s) using standardized tests and measures addressing 3 or more  elements from any of the following: body structures and functions, activity  limitations, and/or participation restrictions Moderate X  Of body system(s) using standardized tests and measures addressing 4 or more  elements from any of the following: body structures and functions, activity  limitations, and/or participation restrictions High  Clinical Presentation - refer to assessment, plan and recommendations sections  above  With stable and/or  uncomplicated characteristics Low  Evolving clinical presentation with changing characteristics Moderate X  Unstable and unpredictable characteristics High  Clinical Decision making ?????? refer to assessment, plan and recommendations  sections above  Low complexity using standardized patient assessment instrument and/or  measurable assessment of functional outcome Low X  Moderate complexity using standardized patient assessment instrument and/or  measurable assessment of functional outcome Moderate  High complexity using standardized patient assessment instrument and/or  measurable assessment of functional outcome High        Charge: 1 Low Eval ( Time:25 )    If pt D/C prior to next session let this evaluation serve as D/C summary.    Voalte: [id number]  Pager: [phone]    Therapist: Levander Campion, PT, DPT  Date: 08/17/2016

## 2016-08-18 NOTE — Unmapped (Signed)
--------------------------------------------------------------------------------  Attestation signed by Wanda First., MD at 08/18/16 [phone]  Attending Note:  Internal Medicine    I have seen and examined the patient in conjunction with the resident team. I  agree with the resident findings, interpretation of data and management plan as  documented in their note linked to mine. Any revisions are noted below.    Cc:  Dyspnea/ LLE pain     HPI:  Pt is a 56 y.o. female with past medical history of breast CA and VTE s/p IVC  filter who presents with hematoma of left thigh    Interval Course:  States feeling well.  Ecchymosis slowly improving in thigh and groin region.  H/H continues to slowly drift downwards.  No other acute issues were noted per nursing staff or chart.    ROS:  Full 12 point review of systems performed and is negative except as noted  in history of present illness.      Objective:  Vitals:   08/18/16 0659 08/18/16 0746 08/18/16 1110 08/18/16 1534  BP:  107/60 97/50 108/49  Pulse:  77 95 80  Resp:  17 17 18   Temp:  98.3 ??????F (36.8 ??????C) 98.5 ??????F (36.9 ??????C) 98.6 ??????F (37 ??????C)  TempSrc:  Oral Oral Oral  SpO2:  100% 100% 100%  Weight: 196 lb 9.6 oz (89.2 kg)  Height:      Labs Personally Reviewed:  Recent Labs     08/17/16   0029  08/17/16   0510  08/18/16   0646  NA  136  137  137  K  3.9  4.2  3.8  CO2  17*  20*  22  BUN  22  23  16   CREATININE  0.70  0.68  0.61    Lab Results  Component Value Date   GLU 99 08/18/2016   GLU 121 (HIGH) 08/17/2016   GLU 123 (HIGH) 08/17/2016        Recent Labs     08/16/16   1243   08/17/16   0029  08/17/16   0510   08/17/16   2029  08/18/16   0052  08/18/16   0646  WBC  10.6   --   9.3  7.3   --    --    --    --  HGB  10.0*   < >  8.5*  8.3*   < >  8.1*  8.1*  7.7*  HCT  29.5*   --   24.9*  24.5*   < >  23.7*  24.2*  22.9*  MCV  87.8   < >  89.5  89.6   < >  88.8  88.4  88.8  PLT  293   --   194  180   --    --    --    --   < > = values in this interval not  displayed.        Lab Results  Component Value Date   INR 1.1 08/16/2016   INR 1.0 05/22/2016   INR 1.2 (HIGH) 02/10/2013   PROTIME 12.3 08/16/2016   PROTIME 11.1 05/22/2016   PROTIME 12.2 (HIGH) 02/10/2013        Lab Results  Component Value Date   BILITOT 2.7 (HIGH) 08/16/2016   AST 22 08/16/2016   ALT 28 08/16/2016   ALKPHOS 68 08/16/2016   ALB 4.5 08/16/2016        Imaging Reviewed:  CT angio abd/ pelvis w/wo contrast (08/16/2016):  1. 5.4 x 11.3 x 11 cm hematoma over the left gluteus minimus region. No  pseudoaneurysm identified at this time. In comparison to a CT performed  yesterday at 1630 hours, this it previously measured 4 x 6 x 7 cm,  therefore this has increased in size. I don't see a definite  pseudoaneurysm, however given interval progression, a formal angiogram may  be helpful to further evaluate.  2. IVC filter  3. Mild nonspecific hepatomegaly       Assessment/ Plan:    Active Hospital Problems   Diagnosis  ?????? **Hematoma of thigh, left, subsequent encounter [S70.12XD]  ?????? 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  Hematoma of left thigh                   - imaging above- hematoma appears increased in size.                   - no surgical intervention at this time.                   - continue to monitor neurovascular checks.                   - ecchymosis improving.   - appreciate vascular surgery input.     2.  Acute LLE DVT                   - venous duplex noted.                   - continue to hold anticoagulation at this time due to H/H  slowly trending downwards.  Has IVC Filter.     3.  Dyspnea/ Chest Discomfort  - CTPA unremarkable.                   - resolved.     4.  Anemia                   - H/H continues to slowly drift downwards.   - decrease frequency of lab draws.  D/c IVF.                   - monitor closely.     5.  Hypothyroidism                   - on  synthroid PO.     6.  Hx of Breast Ca                   - Heme/Onc consulted.        Disposition:  Anticipate d/c tomorrow.        Wanda Penner, MD      Scheduled Medications:    DULoxetine 60 mg Oral Daily  fluticasone 2 Spray Nasal Daily  gabapentin 600 mg Oral BID  ipratropium 2 Spray Nasal BID  levothyroxine 75 mcg Oral Daily    PRN Medications:    acetaminophen 650 mg Oral Q6H PRN  albuterol 2 Puff Inhalation Q6H PRN  atropine 0.5 mg Intravenous Q5 Min PRN  cyclobenzaprine 5 mg Oral TID PRN  nitroglycerin 0.4 mg Sublingual Q5 Min PRN  oxyCODONE 5 mg Oral Q4H PRN      --------------------------------------------------------------------------------  Internal Medicine Resident Progress Note    Resident: [name], MD  Attending: Dr. Celine Mans    Subjective:  Chief Complaint SOB, Left Extremity Pain    Interval History  Pt seen and examined.  - Pt's hematoma's on her left buttock and thigh seem to be receding, pt still  experiencing some pain there but it is improving  - Hgb was 7.7 this morning, will continue to hold heparin, decrease H/H blood  draws to q12hr  - Pt reports that when she gets up and walks her HR goes into the 140's, she no  longer feels sweaty/clammy when this happens like at admission  - Pt also reports that her left knee s/p replacement in April is sore with  bending  - Pt denies SOB, chest pain, abdominal pain, N/V    Review of Systems negative except for above        Objective:  Current Vitals  BP 105/46    Pulse 80    Temp 98 ??????F (36.7 ??????C) (Oral)    Resp 16    Ht 5' 8  (1.727 m)    Wt 196 lb 9.6 oz (89.2 kg)    LMP 04/12/2008    SpO2 99%    BMI  29.89 kg/m??????  Recent Weights:   08/16/16 2120 08/17/16 0500 08/18/16 0659  Weight: 195 lb 4 oz (88.6 kg) 195 lb 6.4 oz (88.6 kg) 196 lb 9.6 oz (89.2 kg)      Physical Exam  General: laying in bed, not in acute distress  Head: atraumatic, normocephalic  Heart: RRR, no murmurs, rubs or gallops  Lungs: Clear to auscultation bilaterally,  no wheezes or crackles  Abdomen: soft, non-tender, normal bowel sounds  Extremities: left calf slightly larger in diameter compared to right,  non-erythematous, no pitting edema  Neuro: moves all 4 limbs spontaneously, CN 2-12 grossly intact, A and O x3      Data  Data Reviewed  Lab Results  Component Value Date   NA 137 08/17/2016   K 4.2 08/17/2016   CL 100 10/08/2012   CO2 20 (LOW) 08/17/2016   BUN 23 08/17/2016   CREATININE 0.68 08/17/2016   GLU 121 (HIGH) 08/17/2016   WBC 7.3 08/17/2016   HGB 8.1 (LOW) 08/18/2016   HCT 24.2 (LOW) 08/18/2016   HCT 33 10/08/2012   PLT 180 08/17/2016   INR 1.1 08/16/2016    Lab Results  Component Value Date   POCGMD 106 (HIGH) 05/26/2016   POCGMD 95 11/28/2013   POCGMD 208 (HIGH) 02/09/2013      Imaging    CT angiography of the chest with contrast  08/17/2016    IMPRESSION:  1. No evidence for pulmonary embolism  2. Small hiatal hernia  3. Vicarious excretion of contrast versus cholelithiasis and nondistended  gallbladder. An abdominal ultrasound could be performed to evaluate for  the possibility of gallstones, if any clinical indication for it.  Signed By: Jacqulyn Bath MD, George Ina Abd & Pel W/wo Cont    Result Date: 08/16/2016  Reason: Anemia, hemorrhage, abdominal pain CT angiography of the abdomen pelvis  without and with contrast TECHNIQUE: Collimated helical images are made from the  lower lungs through the pubic symphysis without intravenous contrast. Then  collimated helical images are made from the lower lungs through the pubic  symphysis after 100 mL Omnipaque 350 intravenous contrast. MIP reformatted  images acquired on a separate workstation. Up-to-date CT equipment and radiation  dose  reduction techniques were employed. Findings CT angiography of the abdomen  without and with contrast: No pleural or pericardial effusions. There is no  evidence for contrast opacification of bowel loops postcontrast. IVC filter  identified. The celiac artery, superior mesenteric  artery, bilateral renal  arteries, inferior mesenteric artery are widely patent. The liver measures 20.4  cm in craniocaudad dimension. The spleen measures 10.6 cm in craniocaudad  dimension. Small hiatal hernia identified with suture identified along the  greater curvature of the stomach. The kidneys, pancreas unremarkable. Findings  CT angiography pelvis: 5.8 mm lymph node identified short axial dimension with  fatty hilum. The region of the gluteus minimus, a 5.4 x 1.3 cm focus of mixed  density is identified. On arterial phase, I do not appreciate any extravasation  into this collection.    IMPRESSION: 1. 5.4 x 11.3 x 11 cm hematoma over the left gluteus minimus region.  No pseudoaneurysm identified at this time. In comparison to a CT performed  yesterday at 1630 hours, this it previously measured 4 x 6 x 7 cm, therefore  this has increased in size. I don't see a definite pseudoaneurysm, however given  interval progression, a formal angiogram may be helpful to further evaluate. 2.  IVC filter 3. Mild nonspecific hepatomegaly Critical results called to Dr.  Francella Solian of [facility] Emergency room at 4:00 PM, 08/16/2016. Signed By:  Jacqulyn Bath MD, Daniel    Ct-lower Extrem W/o Cont Lt    Result Date: 08/15/2016  EXAMINATION: CT of the left femur without contrast INDICATION: Left thigh pain  and bruising TECHNIQUE: CT of the left femur was performed according to standard  protocol. Up-to-date CT equipment and radiation dose reduction techniques were  employed. COMPARISON: Left knee radiographs dated 05/26/2016 FINDINGS: There is  no acute fracture or dislocation. No osseous destruction is seen. Femoral head  alignment is normal. Knee arthroplasty is noted and appears intact. There is a  small suprapatellar effusion. Soft tissue series are limited due to technique  and unable to be corrected at this time. There are two areas of hematoma  measuring 5 cm in the region of the left gluteus medius muscle posterolaterally  and  measuring 5 cm more inferiorly in the left gluteus medius muscle extending  towards the gluteus maximus muscle. The superior extent of one of the hematomas  is partially imaged. There is also stranding in the left anterior and medial  compartment of the thigh. There is mild skin thickening laterally at the hip and  proximal thigh which may represent cellulitis.    IMPRESSION: Moderate size hematomas in the left posterolateral hip predominantly  involving the left gluteus medius muscle. No acute osseous abnormality. Prior  knee arthroplasty. Small suprapatellar effusion. Mild inflammatory stranding in  the anterior and medial compartments of the thigh. Signed By: Nicholes Calamity MD, John    Diag-portable Chest    Result Date: 08/16/2016  EXAM: PORTABLE AP CHEST X-RAY, 12:55 PM INDICATION: SOB/CP, anemia, COMPARISON:  Study compared to previous exam 8/14 FINDINGS: HEART / MEDIASTINUM: Normal in  size. LUNGS/PLEURA: Lungs are clear. BONES / SOFT TISSUES: No acute abnormality.  OTHER: None.    IMPRESSION: 1. No acute disease. Signed By: Virgilio Belling MD, Alex        Microbiology  Blood cultures drawn on 7/16: in process  Urine Culture from 7/15: in process          Assessment/Plan:    #Hematoma L thigh  - Vascular Surgery following, OK to restart heparin when  Hgb is stable  - Hematoma appears to be regressing according to pen outlines  - Venous doppler on 7/16 showed left calf DVT  - CTA of chest did not show pulmonary embolism on 7/16  - q4h neurovascular checks to b/l LE, watch for compartment syndrome  - Tylenol, Oxy IR prn for pain. Will continue neurontin and flexeril  - Fall precautions, OOB with assistance  - PT/OT, can hold while addressing DVT     #Coagulopathy  - hold heparin until Hgb more stable  - SCDs to b/l LE, as tolerated  - IVC filter still in place from prior procedure    #Anemia:  - hold heparin until Hgb stabilizes  - H/H q12hr  - 7.7 on 7/17, gradual decline from 10 on 7/15  - 1 unit PRBC transfused in ED on 7/15  for Hgb of 7.4    #Dyspnea  - effort-induced  - Continue albuterol and atrovent home meds     #Hypothyroidism  -TSH, T4 WNL, tachycardia not likely drug-induced  -Continue synthroid daily    #Hx of (R) Breast Cancer:  - may induce hypercoagulable state  - s/p lumpectomy, chemo, radiation 2014/2015      DVT PPx: SCD    Disposition: 6 [address]      Copely, Luci Bank., MD  Internal Medicine Resident, PGY-1  Pager # [phone]    Discussed with attending physician, Dr. Celine Mans

## 2016-08-18 NOTE — Unmapped (Signed)
VASCULAR SURGERY PROGRESS NOTE    Admit Date: 08/16/2016    Subjective:    No acute events overnight. Hemoglobin stable.  No progression of hematoma on  physical exam.      Objective:    Patient Vitals for the past 8 hrs:   BP Temp Temp src Pulse Resp SpO2  08/18/16 0513 105/46 98 ??????F (36.7 ??????C) Oral 80 16 99 %  08/17/16 2300 107/49 98.4 ??????F (36.9 ??????C) Oral 81 16 99 %      Intake/Output Summary (Last 24 hours) at 08/18/16 1610  Last data filed at 08/17/16 2259   Gross per 24 hour  Intake           891.67 ml  Output              650 ml  Net           241.67 ml      General: NAD  Resp: normal respiratory effort on room air  CV: RRR, normotensive  Abdomen: soft, nontender, nondistended, no rebound tenderness, no guarding  Neuro: alert and oriented x 3  MUSCULOSKELETAL: No deformities. Tenderness along left gluteus hematoma with  bruising. No evidence of skin necrosis  SKIN: superficial bruising along left lateral thigh, medial left thigh and left  popliteal fossa. No hematoma/mass appreciated along these areas.  PV: palpable bilateral DP pulses. Biphasic bilateral PT pulses. No skin mottling  of distal lower legs. Sensation and motor function intact.           Data Review  CBC:  Lab Results  Component Value Date   WBC 7.3 08/17/2016   RBC 2.73 (LOW) 08/17/2016   HGB 8.1 (LOW) 08/18/2016   HCT 24.2 (LOW) 08/18/2016   HCT 33 10/08/2012   PLT 180 08/17/2016    BMP:  Lab Results  Component Value Date   GLU 121 (HIGH) 08/17/2016   NA 137 08/17/2016   K 4.2 08/17/2016   CHLORIDE 108 08/17/2016   CO2 20 (LOW) 08/17/2016   BUN 23 08/17/2016   CREATININE 0.68 08/17/2016   CALCIUM 9.4 08/17/2016    Coagulation:  Lab Results  Component Value Date   INR 1.1 08/16/2016        Assessment:    Principal Problem:    Hematoma of thigh, left, subsequent encounter  Active Problems:    Clotting disorder (CMS HCC)    History of total knee replacement (TKR)      56 year old woman with left gluteal hematoma while on lovenox, with recent hx  of  supratherapeutic dosing.    Plan:    - No signs/symptoms of compartment syndrome  - No active extravasation on CT  - Ecchymosis of gluteal region and thigh stable  - Given acute DVT and stable hemoglobin, would recommend starting low dose  heparin and trending CBC  - Stop heparin if hemoglobin drops or physical exam changes  - Do not need repeat CTA unless clinical condition changes  - Will need 3 months of anticoagulation at discharge    Ma Rings, MD  Vascular Surgery  Pager [phone]

## 2016-08-18 NOTE — Unmapped (Signed)
Inpatient Physical Therapy Progress Note    S: It feels so good to get up and move. This is the first time I've been up and  my heart rate hasn't shot up to the 140s.  Communicated with RN: OK for amb; please treat to pt tolerance. Noted pt's CT  angiogram cancelled this date. Please assess vitals if pt symptomatic and after  amb.  Pt supine in bed at start. Pt left sitting in chair with needs in reach and ice  pack in place on anterior of R knee. Pt with foot stool for comfort.    O:    Pt's vitals following amb: BP 120/63, SpO2 100%, HR 113 to 85 while EOB. Pt  asymptomatic during amb.    Precautions  Activity Level:  (Per RN; OK to treat)  Isolation: None  Cardiac: Telemetry  Other Precautions: thigh hematoma        Bed Mobility  Supine to Sit: Modified independent;HOB elevated  Sit to Supine:  (pt in chair at EOS)  Sit to Stand: Supervision;With Assistive Device (specify) (to [facility])  Bed to Chair: SBA;With Assistive Device (specify) (SPC via amb)        Gait  Pattern: Decreased stance time L  Gait Assistance: SBA  Assistive Device: Straight cane  Distance (ft): 200 Feet  Additional Comments: Pt's HR to low 110's with gait, denies dizziness,  lightheadedness, HA, SOB  Stair management assistance: Not assessed      Interventions/Exercises  Functional Activity Tolerance: Endurance does not limit participation        Pain Information    Pt knoted 4/10 pain in her R knee through session. Pt had no c/o dizziness,  lightheadedness, HA, or SOB through session and following session.      A: Pt pleasant and agreeable to tx this afternoon. Pt had no LOB with functional  mobility, however, kept at SBA for pt confidence. Pt demonstrated good level of  safety awareness through session with use of SPC. Stair negotiation and LE  ther-ex held pending tx of DVT.    Goals to be met by 08/24/16:     1. Bed mobility: Mod I -partially met for supine to sit, HOB elevated  2. Sit to stand: mod I -not met  3. Gait X 200 ft with SPC mod  I -not met  4. Up and down 15 steps with 1 HR CGA -not addressed this session  5. Tolerate 20 reps of TKR exercises -held this session pending DVT POC     P: Recommend current D/C plan of Intermittent supervision;Outpatient PT per  primary PT.  Continue with current POC per PT.    Time: 15 min GT    Charge: 1 GT    Therapist: Marlin Canary, PTA  Date: 08/18/2016  Pager: [phone]

## 2016-08-18 NOTE — Unmapped (Signed)
Inpatient Physical Therapy Progress Note    Completed chart review, communicated with RN. Please defer tx this time pending  results of CT angiogram. RN also noted pt with increased pain and swelling this  date. Will follow up as pt condition and availability and therapist schedule  permit. Thank you.    No charge.    Therapist: Marlin Canary, PTA  Date: 08/18/2016  Pager: [phone]

## 2016-08-19 NOTE — Unmapped (Signed)
Inpatient Physical Therapy Progress Note    Completed chart review, communicated with RN. Unable to work with pt this  morning as pt off unit for duplex at time of attempt. Will hold tx pending test  results and follow up as pt condition and availability and therapist schedule  permit. Thank you.    No charge.    Therapist: Marlin Canary, PTA  Date: 08/19/2016  Pager: [phone]

## 2016-08-19 NOTE — Unmapped (Signed)
Inpatient Physical Therapy Progress Note    Completed chart review, communicated with RN; OK to treat to pt tolerance.  Attempted PT tx session with pt this afternoon. Pt declined tx 2/2 reports of  increased pain and stiffness in her LLE. Will follow up as pt condition and  availability and therapist schedule permit. Thank you.    No charge.      Therapist: Marlin Canary, PTA  Date: 08/19/2016  Pager: [phone]

## 2016-08-19 NOTE — Unmapped (Signed)
--------------------------------------------------------------------------------  Attestation signed by Emeline General., MD at 08/19/16 [phone]  I have seen and examined the patient in conjunction with the resident team. I  agree with the resident findings, interpretation of data and management plan as  documented in their note linked to mine. Findings have been communicated to  consulting and/or primary services. Any revisions are noted below.  Reports worsening pain in the left leg, will check venous duplex today  Left leg swelling about the same  If DVT burden worse, would recommend starting anticoagulation  CT being considered to ensure gluteal hematoma stable.  --------------------------------------------------------------------------------    VASCULAR SURGERY PROGRESS NOTE    Admit Date: 08/16/2016    Subjective:    No acute events overnight.    Objective:    Patient Vitals for the past 8 hrs:   BP Temp Temp src Pulse Resp SpO2 Weight  08/19/16 0323 103/48 98.5 ??????F (36.9 ??????C) Oral 71 16 97 % -  08/19/16 0300 - - - - - - 199 lb 6.4 oz (90.4 kg)  08/18/16 2324 95/45 98.8 ??????F (37.1 ??????C) Oral 85 16 100 % -      Intake/Output Summary (Last 24 hours) at 08/19/16 0512  Last data filed at 08/19/16 0325   Gross per 24 hour  Intake              240 ml  Output                0 ml  Net              240 ml      General: NAD  Resp: normal respiratory effort on room air  CV: RRR, normotensive  Abdomen: soft, nontender, nondistended, no rebound tenderness, no guarding  Neuro: alert and oriented x 3  SKIN: superficial bruising along left lateral thigh, medial left thigh and left  popliteal fossa. No hematoma/mass appreciated along these areas.  PV: palpable bilateral DP pulses. Biphasic bilateral PT pulses. No skin mottling  of distal lower legs. Sensation and motor function intact.           Data Review  CBC:  Lab Results  Component Value Date   WBC 7.3 08/17/2016   RBC 2.73 (LOW) 08/17/2016   HGB 8.2 (LOW) 08/18/2016   HCT  23.4 (LOW) 08/18/2016   HCT 33 10/08/2012   PLT 180 08/17/2016    BMP:  Lab Results  Component Value Date   GLU 99 08/18/2016   NA 137 08/18/2016   K 3.8 08/18/2016   CHLORIDE 109 08/18/2016   CO2 22 08/18/2016   BUN 16 08/18/2016   CREATININE 0.61 08/18/2016   CALCIUM 8.7 08/18/2016    Coagulation:  Lab Results  Component Value Date   INR 1.1 08/16/2016        Assessment:    Principal Problem:    Hematoma of thigh, left, subsequent encounter  Active Problems:    Clotting disorder (CMS HCC)    History of total knee replacement (TKR)      56 year old woman with left gluteal hematoma while on lovenox, with recent hx of  supratherapeutic dosing.    Plan:    - No signs/symptoms of compartment syndrome  - No active extravasation on CT  - Ecchymosis of gluteal region and thigh stable  - Given acute DVT, recommend starting low dose heparin when hemoglobin stable  - Stop heparin if hemoglobin drops or physical exam changes  - Do not need repeat CTA  unless clinical condition changes  - Will need 3 months of anticoagulation at discharge    Ma Rings, MD  Vascular Surgery  Pager [phone]

## 2016-08-19 NOTE — Unmapped (Signed)
Medical Oncology/Hematology    Hospital  LOS: 3 days  Code Status: Full Code  Primary Oncologist: Magdalene River, MD    Oncology/hematology and Treatment Plan Update for 08/19/2016  Hx/new DVT: pt had a IVC filter placed in April due to multiple knee surgeries  CT angio negative for PE       large hematoma to left hip/sacrum with likely supratherapeutic dosing was  supposed to be on 40 mg daily but was taking 100 mg  DVT gastrocnemius L by doppler    Anemia, likely related to  left gluteal hematoma while on lovenox    Reviewed imaging and labs    More discomfort in LLE hip, thigh and calf very sore when trying to ambulate and  doesn't have full ROM from her knee replacement yet  The ecchymosis has extended  past the drawn borders but no new palpable findings.  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    Disposition Plan  Per attending      Subjective  Subjective:  CC: left leg bruising and back pain    HPI: Wanda Rowe was admitted for anemia and hematoma from supratherapeutic doses  of lovenox. Pt states she has been off and on lovenox with dose adjustments  since April this year. She was on xarelto when she was first diagnosed in 09/2015  but in 01/2016 when she was preparing for knee surgery she had dopplers and  showed her DVT was resolving. She was then placed on SQ lovenox. She had a IVC  filter placed in March 2018. She then had knee surgery was off lovenox prior to  and then restarted after her aurgery on a prophylactic dose that evening then  bid for 2 weeks. Its unclear what  dose she actually was taking as she states  that the surgeon and Dr Selena Batten had different thoughts on treatment. She says that  she was squirting half a syringe out and giving herslf that amount daily. She  says she was traveling and dosed herself with 100 mq sq BID 2 days before and 1  day after. She said that when she was getting off the plan her left leg and her  back was painful. She was seen in the ED and  had a hematoma and was given  prednisone. The next day she saw her PCP and they recommended she stop her  anticoagulation and follow up with Dr. Selena Batten. She became short of beath and was  seen in the ED and given a unit of blood. The pain and bruising worsened so she  went to to the ED and was admitted.    Update  Overnight more discomfort LLE when ambulation hip, thigh and calf all tender and  knee hasnt fully recovered from knee replacement surgery  Current Pain Level: denies  ROS 10 point systems reviewed and pertinent findings are in the HPI        Current Medications:      DULoxetine 60 mg Oral Daily  fluticasone  2 Spray Nasal Daily  gabapentin 600 mg Oral BID  ipratropium 2 Spray Nasal BID  levothyroxine 75 mcg Oral Daily      acetaminophen 650 mg Oral Q6H PRN  albuterol 2 Puff Inhalation Q6H PRN  atropine 0.5 mg Intravenous Q5 Min PRN  cyclobenzaprine 5 mg Oral TID PRN  nitroglycerin 0.4 mg Sublingual Q5 Min PRN  oxyCODONE 5 mg Oral Q4H PRN          General Multisystem Physical Exam    Patient Vitals for the past 24 hrs:   BP Temp Temp src Pulse Resp SpO2 Weight  08/19/16 0323 103/48 98.5 ??????F (36.9 ??????C) Oral 71 16 97 % -  08/19/16 0300 - - - - - - 199 lb 6.4 oz  08/18/16 2324 95/45 98.8 ??????F (37.1 ??????C) Oral 85 16 100 % -  08/18/16 1957 116/51 98.3 ??????F (36.8 ??????C) Oral 76 16 100 % -  08/18/16 1534 108/49 98.6 ??????F (37 ??????C) Oral 80 18 100 % -  08/18/16 1110 97/50 98.5 ??????F (36.9 ??????C) Oral 95 17 100 % -  08/18/16 0746 107/60 98.3 ??????F (36.8 ??????C) Oral 77 17 100 % -    Most Recent Weight: 199 lb 6.4  oz  Initial Weight: 195 lb  Body mass index is 30.32 kg/m??????.    Cardiac Rhythm: Normal sinus rhythm  Oxygen Delivery: O2 Delivery Device: None (Room air), O2 Flow Rate (l/min): 0  l/min,      Intake/Output Summary (Last 24 hours) at 08/19/16 0731  Last data filed at 08/19/16 0659   Gross per 24 hour  Intake              240 ml  Output                0 ml  Net              240 ml      General Appearance: Well-developed, not acutely ill, conversant  External Ears, nose, and lips  normal  Lungs: Clear, no use of accessory muscles  CV:RRR no MGR.  Abd: Soft, NT/ND.  No HSM. No ascites  Ext: No CC. Left leg +1 swelling. Left hip,buttocks, posterior thigh, and  posterior knee, and inner thigh with large resolving ecchymosis/hematoma but  extending past the inked borders  No new palpable findings  Psychiatric: Normal insight/judgement. Oriented to person, place and time  Skin: No rashes or petechia to inspection.  No nodules on palpation.    ECOG Performance Status: (1) Restricted in physically strenuous activity,  ambulatory and able to do work of light nature      Data Reviewed    Laboratory Data  CBC:  Recent Labs     08/16/16   1243   08/17/16   0029  08/17/16   0510   08/18/16   0646  08/18/16   1912  08/19/16   0534  WBC  10.6   --   9.3  7.3   --    --    --    --  HGB  10.0*   < >  8.5*  8.3*   < >  7.7*  8.2*  7.8*  HCT  29.5*   --   24.9*  24.5*   < >  22.9*  23.4*  22.8*  MCV  87.8   < >  89.5  89.6   < >  88.8  89.0  89.2  PLT  293   --  194  180   --    --    --    --  NEUTOPHILPCT  79.0   --   75.4   --    --    --    --    --  LYMPHOPCT  13.9   --   16.6   --    --    --    --    --  MONOPCT  5.3   --   7.1   --    --    --    --    --  EOSPCT  0.5   --   0.6   --    --    --    --    --  BASOPCT  1.3   --   0.3   --    --    --    --    --  NEUTROABS  8.4*   --   7.0   --    --    --    --    --  LYMPHSABS  1.5   --   1.5   --    --    --    --    --  MONOSABS  0.6   --   0.7   --    --    --    --     --  EOSABS  0.1   --   0.1   --    --    --    --    --  BASOSABS  0.1   --   0.0   --    --    --    --    --   < > = values in this interval not displayed.    Renal/Hepatic:  Recent Labs     08/16/16   1243   08/17/16   0510  08/18/16   0646  08/19/16   0534  NA  138   < >  137  137  138  K  3.7   < >  4.2  3.8  4.1  CO2  17*   < >  20*  22  25  BUN  19   < >  23  16  17   CREATININE  0.86   < >  0.68  0.61  0.63  GLU  163*   < >  121*  99  103*  CALCIUM  10.3   < >  9.4  8.7  8.8  BILITOT  2.7*   --    --    --    --  AST  22   --    --    --    --  ALT  28   --    --    --    --  ALKPHOS  68   --    --    --    --  PROT  7.5   --    --    --    --  ALB  4.5   --    --    --    --   < > = values in this interval not displayed.    Invalid input(s): 3    Recent Micro Results (Last 14 days):  Recent Results (from the past 336 hour(s))  ROUTINE URINE  CULTURE   Collection Time: 08/16/16  3:17 PM  Result Value Ref Range   Culture Result: Mixed Skin/Urogenital Flora.  No Further Workup.  BLOOD CULTURE (AEROBIC & ANAEROBIC)   Collection Time: 08/17/16 12:29 AM  Result Value Ref Range   Culture Result: No Growth To Date  BLOOD CULTURE (AEROBIC & ANAEROBIC)   Collection Time: 08/17/16  5:10 AM  Result Value Ref Range   Culture Result: No Growth To Date      Recent Radiology Results:  Results in EPIC reviewed    CT angio neg for PE  Doppler new bwlow the knee DVT

## 2016-08-19 NOTE — Unmapped (Signed)
--------------------------------------------------------------------------------  Attestation signed by Twana First., MD at 08/19/16 1532  Attending Note:  Internal Medicine    I have seen and examined the patient in conjunction with the resident team. I  agree with the resident findings, interpretation of data and management plan as  documented in their note linked to mine. Any revisions are noted below.    Cc:  Dyspnea/ LLE pain     HPI:  Pt is a 56 y.o. female with past medical history of breast CA and VTE s/p IVC  filter who presents with hematoma of left thigh    Interval Course:  States right lower ext tightness and knee swelling.  No other acute issues were noted per nursing staff or chart.    ROS:  Full 12 point review of systems performed and is negative except as noted  in history of present illness.      Objective:  Vitals:   08/19/16 0750 08/19/16 0756 08/19/16 1105 08/19/16 1516  BP: 106/58 106/58 106/46 112/55  Pulse: 71 70 86 83  Resp: 18 16 16 15   Temp: 98.6 ??????F (37 ??????C) 98.6 ??????F (37 ??????C) 98.6 ??????F (37 ??????C) 98.6 ??????F (37 ??????C)  TempSrc: Oral Oral Oral Oral  SpO2: 99% 99% 100% 100%  Weight:  Height:      Labs Personally Reviewed:  Recent Labs     08/17/16   0510  08/18/16   0646  08/19/16   0534  NA  137  137  138  K  4.2  3.8  4.1  CO2  20*  22  25  BUN  23  16  17   CREATININE  0.68  0.61  0.63    Lab Results  Component Value Date   GLU 103 (HIGH) 08/19/2016   GLU 99 08/18/2016   GLU 121 (HIGH) 08/17/2016        Recent Labs     08/17/16   0029  08/17/16   0510   08/18/16   0646  08/18/16   1912  08/19/16   0534  WBC  9.3  7.3   --    --    --    --  HGB  8.5*  8.3*   < >  7.7*  8.2*  7.8*  HCT  24.9*  24.5*   < >  22.9*  23.4*  22.8*  MCV  89.5  89.6   < >  88.8  89.0  89.2  PLT  194  180   --    --    --    --   < > = values in this interval not displayed.        Lab Results  Component Value Date   INR 1.1 08/16/2016   INR 1.0 05/22/2016   INR 1.2 (HIGH) 02/10/2013   PROTIME 12.3  08/16/2016   PROTIME 11.1 05/22/2016   PROTIME 12.2 (HIGH) 02/10/2013        Lab Results  Component Value Date   BILITOT 2.7 (HIGH) 08/16/2016   AST 22 08/16/2016   ALT 28 08/16/2016   ALKPHOS 68 08/16/2016   ALB 4.5 08/16/2016        Imaging Reviewed:    CT angio abd/ pelvis w/wo contrast (08/16/2016):  1. 5.4 x 11.3 x 11 cm hematoma over the left gluteus minimus region. No  pseudoaneurysm identified at this time. In comparison to a CT performed  yesterday at 1630 hours, this it previously measured 4 x 6 x  7 cm,  therefore this has increased in size. I don't see a definite  pseudoaneurysm, however given interval progression, a formal angiogram may  be helpful to further evaluate.  2. IVC filter  3. Mild nonspecific hepatomegaly       Assessment/ Plan:    Active Hospital Problems   Diagnosis  ?????? **Hematoma of thigh, left, subsequent encounter [S70.12XD]  ?????? 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  Hematoma of left thigh                   - imaging above- hematoma appears increased in size.                   - no surgical intervention at this time.                   - continue to monitor neurovascular checks.                   - ecchymosis improving.   - obtain CT imaging to evaluate hematoma stability.   - appreciate vascular surgery input.     2.  Acute LLE DVT                   - venous duplex noted.   - hx of IVC filter placement.                   - continue to hold anticoagulation at this time however plan  to resume if imaging stable.  Refer #1.     3.  Dyspnea/ Chest Discomfort  - CTPA unremarkable.                   - resolved.     4.  Anemia                   - H/H continues to slowly drift downwards.                   - monitor closely.     5.  Hypothyroidism                   - on synthroid PO.     6.  Hx of Breast Ca                   - Heme/Onc following.        Disposition:   Anticipate d/c tomorrow.        [name], MD      Scheduled Medications:    DULoxetine 60 mg Oral Daily  fluticasone 2 Spray Nasal Daily  gabapentin 600 mg Oral BID  ipratropium 2 Spray Nasal BID  levothyroxine 75 mcg Oral Daily    PRN Medications:    acetaminophen 650 mg Oral Q6H PRN  albuterol 2 Puff Inhalation Q6H PRN  atropine 0.5 mg Intravenous Q5 Min PRN  cyclobenzaprine 5 mg Oral TID PRN  nitroglycerin 0.4 mg Sublingual Q5 Min PRN  oxyCODONE 5 mg Oral Q4H PRN      --------------------------------------------------------------------------------      Internal Medicine Resident Progress Note    Resident: [name], MD  Attending: Dr. Celine Mans    Subjective:  Chief Complaint SOB, Left Extremity Pain  Interval History  Pt seen and examined.  - Pt has developing hematoma behind left knee, it is painful  - She says this worsening pain is making it harder to walk  - There is also swelling in her left knee that may be worsening (s/p replacement  in April 2018)  - Hgb relatively stable at 7.8 this morning  - Pt reports that she does not feel as tachycardic when she walks now, and is  not SOB on exertion like she was before  - LLE extremity was tightly wrapped, no s/s of compartment syndrome this  morning, sensory intact, warm    Review of Systems negative except for above        Objective:  Current Vitals  BP 103/48    Pulse 71    Temp 98.5 ??????F (36.9 ??????C) (Oral)    Resp 16    Ht 5' 8  (1.727 m)    Wt 199 lb 6.4 oz (90.4 kg)    LMP 04/12/2008    SpO2 97%    BMI  30.32 kg/m??????  Recent Weights:   08/17/16 0500 08/18/16 0659 08/19/16 0300  Weight: 195 lb 6.4 oz (88.6 kg) 196 lb 9.6 oz (89.2 kg) 199 lb 6.4 oz (90.4 kg)      Physical Exam  General: laying in bed, not in acute distress  Head: atraumatic, normocephalic  Heart: RRR, no murmurs, rubs or gallops  Lungs: Clear to auscultation bilaterally, no wheezes or crackles  Abdomen: soft, non-tender, normal bowel sounds  Extremities: left calf slightly larger  in diameter compared to right,  non-erythematous, no pitting edema  Neuro: moves all 4 limbs spontaneously, CN 2-12 grossly intact, A and O x3      Data  Data Reviewed  Lab Results  Component Value Date   NA 138 08/19/2016   K 4.1 08/19/2016   CL 100 10/08/2012   CO2 25 08/19/2016   BUN 17 08/19/2016   CREATININE 0.63 08/19/2016   GLU 103 (HIGH) 08/19/2016   WBC 7.3 08/17/2016   HGB 7.8 (LOW) 08/19/2016   HCT 22.8 (LOW) 08/19/2016   HCT 33 10/08/2012   PLT 180 08/17/2016   INR 1.1 08/16/2016    Lab Results  Component Value Date   POCGMD 106 (HIGH) 05/26/2016   POCGMD 95 11/28/2013   POCGMD 208 (HIGH) 02/09/2013      Imaging    CT angiography of the chest with contrast  08/17/2016    IMPRESSION:  1. No evidence for pulmonary embolism  2. Small hiatal hernia  3. Vicarious excretion of contrast versus cholelithiasis and nondistended  gallbladder. An abdominal ultrasound could be performed to evaluate for  the possibility of gallstones, if any clinical indication for it.  Signed By: Jacqulyn Bath MD, George Ina Abd & Pel W/wo Cont    Result Date: 08/16/2016  Reason: Anemia, hemorrhage, abdominal pain CT angiography of the abdomen pelvis  without and with contrast TECHNIQUE: Collimated helical images are made from the  lower lungs through the pubic symphysis without intravenous contrast. Then  collimated helical images are made from the lower lungs through the pubic  symphysis after 100 mL Omnipaque 350 intravenous contrast. MIP reformatted  images acquired on a separate workstation. Up-to-date CT equipment and radiation  dose reduction techniques were employed. Findings CT angiography of the abdomen  without and with contrast: No pleural or pericardial effusions. There is no  evidence for contrast opacification of bowel loops postcontrast. IVC filter  identified. The celiac artery, superior mesenteric artery, bilateral renal  arteries, inferior mesenteric artery are widely patent. The liver measures 20.4  cm in  craniocaudad dimension. The spleen measures 10.6 cm in craniocaudad  dimension. Small hiatal hernia identified with suture identified along the  greater curvature of the stomach. The kidneys, pancreas unremarkable. Findings  CT angiography pelvis: 5.8 mm lymph node identified short axial dimension with  fatty hilum. The region of the gluteus minimus, a 5.4 x 1.3 cm focus of mixed  density is identified. On arterial phase, I do not appreciate any extravasation  into this collection.    IMPRESSION: 1. 5.4 x 11.3 x 11 cm hematoma over the left gluteus minimus region.  No pseudoaneurysm identified at this time. In comparison to a CT performed  yesterday at 1630 hours, this it previously measured 4 x 6 x 7 cm, therefore  this has increased in size. I don't see a definite pseudoaneurysm, however given  interval progression, a formal angiogram may be helpful to further evaluate. 2.  IVC filter 3. Mild nonspecific hepatomegaly Critical results called to Dr.  Francella Solian of [facility] Emergency room at 4:00 PM, 08/16/2016. Signed By:  Jacqulyn Bath MD, Daniel    Ct-lower Extrem W/o Cont Lt    Result Date: 08/15/2016  EXAMINATION: CT of the left femur without contrast INDICATION: Left thigh pain  and bruising TECHNIQUE: CT of the left femur was performed according to standard  protocol. Up-to-date CT equipment and radiation dose reduction techniques were  employed. COMPARISON: Left knee radiographs dated 05/26/2016 FINDINGS: There is  no acute fracture or dislocation. No osseous destruction is seen. Femoral head  alignment is normal. Knee arthroplasty is noted and appears intact. There is a  small suprapatellar effusion. Soft tissue series are limited due to technique  and unable to be corrected at this time. There are two areas of hematoma  measuring 5 cm in the region of the left gluteus medius muscle posterolaterally  and measuring 5 cm more inferiorly in the left gluteus medius muscle extending  towards the gluteus maximus muscle.  The superior extent of one of the hematomas  is partially imaged. There is also stranding in the left anterior and medial  compartment of the thigh. There is mild skin thickening laterally at the hip and  proximal thigh which may represent cellulitis.    IMPRESSION: Moderate size hematomas in the left posterolateral hip predominantly  involving the left gluteus medius muscle. No acute osseous abnormality. Prior  knee arthroplasty. Small suprapatellar effusion. Mild inflammatory stranding in  the anterior and medial compartments of the thigh. Signed By: Nicholes Calamity MD, John    Diag-portable Chest    Result Date: 08/16/2016  EXAM: PORTABLE AP CHEST X-RAY, 12:55 PM INDICATION: SOB/CP, anemia, COMPARISON:  Study compared to previous exam 8/14 FINDINGS: HEART / MEDIASTINUM: Normal in  size. LUNGS/PLEURA: Lungs are clear. BONES / SOFT TISSUES: No acute abnormality.  OTHER: None.    IMPRESSION: 1. No acute disease. Signed By: Virgilio Belling MD, Alex        Microbiology  Blood cultures drawn on 7/16: in process  Urine Culture from 7/15: in process          Assessment/Plan:    #Hematoma L thigh  - CTA of left leg ordered on 7/18 to check for active bleeding before restarting  heparin  - Vascular Surgery following, OK to restart heparin when Hgb is stable  - Hematoma appears to be regressing according to pen outlines on left thigh  and  buttocks, worsening hematoma posterior left knee  - CTA of chest did not show pulmonary embolism on 7/16  - Tylenol, Oxy IR prn for pain. Will continue neurontin and flexeril  - Fall precautions, OOB with assistance     #Coagulopathy  - LLE DVT in calf -- repeat Duplex on 7/18 does not show significant progression  on preliminary report  - LLE currently wrapped, monitor for s/s of compartment syndrome  - Venous doppler on 7/16 showed left calf DVT  - hold heparin until Hgb more stable, r/o active bleed  - SCDs to b/l LE, as tolerated  - IVC filter still in place from prior procedure  - PT/OT, can hold while  addressing DVT    #Anemia:  - hold heparin until Hgb stabilizes  - H/H q12hr  - Hgb 8.2 7/17 PM, 7.8 7/18 AM  - 1 unit PRBC transfused in ED on 7/15 for Hgb of 7.4    #Dyspnea  - Resolved  - Continue albuterol and atrovent home meds     #Hypothyroidism  -TSH, T4 WNL, tachycardia not likely drug-induced  -Continue synthroid daily    #Hx of (R) Breast Cancer:  - may induce hypercoagulable state  - s/p lumpectomy, chemo, radiation 2014/2015      DVT PPx: SCD    Disposition: 6 south      Copely, Luci Bank., MD  Internal Medicine Resident, PGY-1  Pager # [phone]    Discussed with attending physician, Dr. Celine Mans

## 2016-08-20 NOTE — Unmapped (Signed)
VASCULAR SURGERY PROGRESS NOTE    Admit Date: 08/16/2016    Subjective:    No acute events overnight. Heparin started overnight.  Hemoglobin stable.  Complains of pain in left thigh.    Objective:    Patient Vitals for the past 8 hrs:   BP Temp Temp src Pulse Resp SpO2 Weight  08/20/16 0345 98/50 98.6 ??????F (37 ??????C) Oral 74 16 100 % 198 lb 9.6 oz (90.1 kg)  08/19/16 2320 97/54 98.2 ??????F (36.8 ??????C) Oral 81 16 100 % -      Intake/Output Summary (Last 24 hours) at 08/20/16 0634  Last data filed at 08/20/16 0345   Gross per 24 hour  Intake              828 ml  Output             1000 ml  Net             -172 ml      General: NAD  Resp: normal respiratory effort on room air  CV: RRR, normotensive  Abdomen: soft, nontender, nondistended, no rebound tenderness, no guarding  Neuro: alert and oriented x 3  SKIN: superficial bruising along left lateral thigh, medial left thigh and left  popliteal fossa. No hematoma/mass appreciated along these areas.  PV: palpable bilateral DP pulses. Biphasic bilateral PT pulses. No skin mottling  of distal lower legs. Sensation and motor function intact.           Data Review  CBC:  Lab Results  Component Value Date   WBC 6.3 08/19/2016   RBC 2.68 (LOW) 08/19/2016   HGB 8.0 (LOW) 08/20/2016   HCT 23.6 (LOW) 08/20/2016   HCT 33 10/08/2012   PLT 171 08/19/2016    BMP:  Lab Results  Component Value Date   GLU 103 (HIGH) 08/19/2016   NA 138 08/19/2016   K 4.1 08/19/2016   CHLORIDE 108 08/19/2016   CO2 25 08/19/2016   BUN 17 08/19/2016   CREATININE 0.63 08/19/2016   CALCIUM 8.8 08/19/2016    Coagulation:  Lab Results  Component Value Date   INR 1.0 08/19/2016        Assessment:    Principal Problem:    Hematoma of thigh, left, subsequent encounter  Active Problems:    Clotting disorder (CMS HCC)    History of total knee replacement (TKR)      56 year old woman with left gluteal hematoma while on lovenox, with recent hx of  supratherapeutic dosing.    Plan:    - No signs/symptoms of compartment  syndrome  - No active extravasation on CT  - Ecchymosis of gluteal region and thigh stable  - Given acute DVT, recommend starting low dose heparin when hemoglobin stable  - Stop heparin if hemoglobin drops or physical exam changes  - Will need 3 months of anticoagulation at discharge    Ma Rings, MD  Vascular Surgery  Pager [phone]

## 2016-08-20 NOTE — Unmapped (Signed)
--------------------------------------------------------------------------------  Attestation signed by Twana First., MD at 08/21/16 0202  Attending Note:  Internal Medicine    I have seen and examined the patient in conjunction with the resident team. I  agree with the resident findings, interpretation of data and management plan as  documented in their note linked to mine. Any revisions are noted below.    Cc:  Dyspnea/ LLE pain     HPI:  Pt is a 56 y.o. female with past medical history of breast CA and VTE s/p IVC  filter who presents with hematoma of left thigh    Interval Course:  Breathing much better  States LLE pain when walking.  No other acute issues were noted per nursing staff or chart.    ROS:  Full 12 point review of systems performed and is negative except as noted  in history of present illness.      Objective:  Vitals:   08/19/16 2320 08/20/16 0345 08/20/16 0752 08/20/16 1103  BP: 97/54 98/50 99/49  92/66  Pulse: 81 74 70 90  Resp: 16 16 16 16   Temp: 98.2 ??????F (36.8 ??????C) 98.6 ??????F (37 ??????C) 99 ??????F (37.2 ??????C) 98.7 ??????F (37.1 ??????C)  TempSrc: Oral Oral Oral Oral  SpO2: 100% 100% 99% 100%  Weight:  198 lb 9.6 oz (90.1 kg)  Height:      Labs Personally Reviewed:  Recent Labs     08/18/16   0646  08/19/16   0534  08/20/16   0610  NA  137  138  138  K  3.8  4.1  3.5  CO2  22  25  24   BUN  16  17  11   CREATININE  0.61  0.63  0.58    Lab Results  Component Value Date   GLU 105 (HIGH) 08/20/2016   GLU 103 (HIGH) 08/19/2016   GLU 99 08/18/2016        Recent Labs     08/19/16   0534  08/19/16   1656  08/20/16   0610  WBC   --   6.3   --  HGB  7.8*  8.1*  8.0*  HCT  22.8*  24.2*  23.6*  MCV  89.2  90.2  89.2  PLT   --   171   --        Lab Results  Component Value Date   INR 1.0 08/19/2016   INR 1.1 08/16/2016   INR 1.0 05/22/2016   PROTIME 11.5 08/19/2016   PROTIME 12.3 08/16/2016   PROTIME 11.1 05/22/2016        Lab Results  Component Value Date   BILITOT 2.7 (HIGH) 08/16/2016   AST 22 08/16/2016   ALT  28 08/16/2016   ALKPHOS 68 08/16/2016   ALB 4.5 08/16/2016        Imaging Reviewed:    CT angio abd/ pelvis w/wo contrast (08/16/2016):  1. 5.4 x 11.3 x 11 cm hematoma over the left gluteus minimus region. No  pseudoaneurysm identified at this time. In comparison to a CT performed  yesterday at 1630 hours, this it previously measured 4 x 6 x 7 cm,  therefore this has increased in size. I don't see a definite  pseudoaneurysm, however given interval progression, a formal angiogram may  be helpful to further evaluate.  2. IVC filter  3. Mild nonspecific hepatomegaly       Assessment/ Plan:    Active Hospital Problems   Diagnosis  ?????? **Hematoma of  thigh, left, subsequent encounter [S70.12XD]  ?????? 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  Hematoma of left thigh                   - imaging above- hematoma appears increased in size.                   - no surgical intervention at this time.                   - continue to monitor neurovascular checks.                   - ecchymosis improving.   - repeat CT imaging revealed stable hematoma.   - appreciate vascular surgery input.     2.  Acute LLE DVT                   - venous duplex noted.   - hx of IVC filter placement.                   - switch from heparin gtt to bolus Eliquis and thereafter  maintenance dosage.     3.  Dyspnea/ Chest Discomfort  - CTPA unremarkable.                   - resolved.     4.  Anemia                   - H/H stable.                   - monitor closely.     5.  Hypothyroidism                   - on synthroid PO.     6.  Hx of Breast Ca                   - Heme/Onc following.    7.  LLE Pain  - likely due to acute DVT.        Disposition:  D/c today with follow-up PCP and Heme/Onc in 1- 2 weeks.  Time  spent > 30 min.        [name],  MD          --------------------------------------------------------------------------------      Internal Medicine Resident Progress Note    Resident: [name], MD  Attending: Dr. Celine Mans    Subjective:  Chief Complaint SOB, Left Extremity Pain    Interval History  Pt seen and examined.  - Pt reports pain in left calf with dorsiflexion, is able to ambulate if she  walks on ball of foot/toes, at worst the pain is 8/10, minimal pain at rest  - Pt is otherwise feeling well, and is no longer short of breath on exertion  - Pt denies N/V/D, abdominal pain, racing HR/palpitations    Review of Systems negative except for above        Objective:  Current Vitals  BP 92/66    Pulse 90    Temp 98.7 ??????F (37.1 ??????C) (Oral)    Resp 16    Ht 5' 8  (1.727 m)    Wt 198 lb 9.6 oz (90.1 kg)  LMP 04/12/2008    SpO2 100%    BMI  30.20 kg/m??????  Recent Weights:   08/18/16 0659 08/19/16 0300 08/20/16 0345  Weight: 196 lb 9.6 oz (89.2 kg) 199 lb 6.4 oz (90.4 kg) 198 lb 9.6 oz (90.1 kg)      Physical Exam  General: laying in bed, not in acute distress  Head: atraumatic, normocephalic  Heart: RRR, no murmurs, rubs or gallops  Lungs: Clear to auscultation bilaterally, no wheezes or crackles  Abdomen: soft, non-tender, normal bowel sounds  Extremities: left calf slightly larger in diameter compared to right,  non-erythematous, no pitting edema  Neuro: moves all 4 limbs spontaneously, CN 2-12 grossly intact, A and O x3      Data  Data Reviewed  Lab Results  Component Value Date   NA 138 08/20/2016   K 3.5 08/20/2016   CL 100 10/08/2012   CO2 24 08/20/2016   BUN 11 08/20/2016   CREATININE 0.58 08/20/2016   GLU 105 (HIGH) 08/20/2016   WBC 6.3 08/19/2016   HGB 8.0 (LOW) 08/20/2016   HCT 23.6 (LOW) 08/20/2016   HCT 33 10/08/2012   PLT 171 08/19/2016   INR 1.0 08/19/2016    Lab Results  Component Value Date   POCGMD 106 (HIGH) 05/26/2016   POCGMD 95 11/28/2013   POCGMD 208 (HIGH) 02/09/2013      Imaging    CT angiography of the chest  with contrast  08/17/2016    IMPRESSION:  1. No evidence for pulmonary embolism  2. Small hiatal hernia  3. Vicarious excretion of contrast versus cholelithiasis and nondistended  gallbladder. An abdominal ultrasound could be performed to evaluate for  the possibility of gallstones, if any clinical indication for it.  Signed By: Jacqulyn Bath MD, George Ina Abd & Pel W/wo Cont    Result Date: 08/16/2016  Reason: Anemia, hemorrhage, abdominal pain CT angiography of the abdomen pelvis  without and with contrast TECHNIQUE: Collimated helical images are made from the  lower lungs through the pubic symphysis without intravenous contrast. Then  collimated helical images are made from the lower lungs through the pubic  symphysis after 100 mL Omnipaque 350 intravenous contrast. MIP reformatted  images acquired on a separate workstation. Up-to-date CT equipment and radiation  dose reduction techniques were employed. Findings CT angiography of the abdomen  without and with contrast: No pleural or pericardial effusions. There is no  evidence for contrast opacification of bowel loops postcontrast. IVC filter  identified. The celiac artery, superior mesenteric artery, bilateral renal  arteries, inferior mesenteric artery are widely patent. The liver measures 20.4  cm in craniocaudad dimension. The spleen measures 10.6 cm in craniocaudad  dimension. Small hiatal hernia identified with suture identified along the  greater curvature of the stomach. The kidneys, pancreas unremarkable. Findings  CT angiography pelvis: 5.8 mm lymph node identified short axial dimension with  fatty hilum. The region of the gluteus minimus, a 5.4 x 1.3 cm focus of mixed  density is identified. On arterial phase, I do not appreciate any extravasation  into this collection.    IMPRESSION: 1. 5.4 x 11.3 x 11 cm hematoma over the left gluteus minimus region.  No pseudoaneurysm identified at this time. In comparison to a CT performed  yesterday at 1630 hours,  this it previously measured 4 x 6 x 7 cm, therefore  this has increased in size. I don't see a definite pseudoaneurysm, however given  interval progression, a formal angiogram  may be helpful to further evaluate. 2.  IVC filter 3. Mild nonspecific hepatomegaly Critical results called to Dr.  Francella Solian of [facility] Emergency room at 4:00 PM, 08/16/2016. Signed By:  Jacqulyn Bath MD, Daniel    Ct-lower Extrem W/o Cont Lt    Result Date: 08/15/2016  EXAMINATION: CT of the left femur without contrast INDICATION: Left thigh pain  and bruising TECHNIQUE: CT of the left femur was performed according to standard  protocol. Up-to-date CT equipment and radiation dose reduction techniques were  employed. COMPARISON: Left knee radiographs dated 05/26/2016 FINDINGS: There is  no acute fracture or dislocation. No osseous destruction is seen. Femoral head  alignment is normal. Knee arthroplasty is noted and appears intact. There is a  small suprapatellar effusion. Soft tissue series are limited due to technique  and unable to be corrected at this time. There are two areas of hematoma  measuring 5 cm in the region of the left gluteus medius muscle posterolaterally  and measuring 5 cm more inferiorly in the left gluteus medius muscle extending  towards the gluteus maximus muscle. The superior extent of one of the hematomas  is partially imaged. There is also stranding in the left anterior and medial  compartment of the thigh. There is mild skin thickening laterally at the hip and  proximal thigh which may represent cellulitis.    IMPRESSION: Moderate size hematomas in the left posterolateral hip predominantly  involving the left gluteus medius muscle. No acute osseous abnormality. Prior  knee arthroplasty. Small suprapatellar effusion. Mild inflammatory stranding in  the anterior and medial compartments of the thigh. Signed By: Nicholes Calamity MD, John    Diag-portable Chest    Result Date: 08/16/2016  EXAM: PORTABLE AP CHEST X-RAY, 12:55 PM  INDICATION: SOB/CP, anemia, COMPARISON:  Study compared to previous exam 8/14 FINDINGS: HEART / MEDIASTINUM: Normal in  size. LUNGS/PLEURA: Lungs are clear. BONES / SOFT TISSUES: No acute abnormality.  OTHER: None.    IMPRESSION: 1. No acute disease. Signed By: Virgilio Belling MD, Alex        Microbiology  Blood cultures drawn on 7/16: in process  Urine Culture from 7/15: in process          Assessment/Plan:    #Hematoma L thigh  - CTA of left leg 7/18: no active bleed  - Vascular Surgery following, OK to restart heparin when Hgb is stable  - CTA of chest did not show pulmonary embolism on 7/16     #Coagulopathy  - Heparin was given overnight, start Eliquis today before going home  - Pt was given sample packs to cover loading doses of 10mg  BID for 7 days, will  take 5mg  BID for 6 months  - Venous doppler on 7/16 showed left calf DVT, no progression seen on duplex  from 7/18  - IVC filter still in place from prior procedure    #Anemia:  - Hgb 8.0 on 7/19  - 1 unit PRBC transfused in ED on 7/15 for Hgb of 7.4    #Dyspnea  - Resolved  - Continue albuterol and atrovent home meds     #Hypothyroidism  -TSH, T4 WNL, tachycardia not likely drug-induced  -Continue synthroid daily    #Hx of (R) Breast Cancer:  - may induce hypercoagulable state  - s/p lumpectomy, chemo, radiation 2014/2015      DVT PPx: SCD    Disposition: home today      Copely, Luci Bank., MD  Internal Medicine Resident, PGY-1  Pager # [phone]  Discussed with attending physician, Dr. Celine Mans

## 2016-08-20 NOTE — Unmapped (Signed)
This is a notification of a Discharge Alert generated by [employer].     This patient visited the following location: [facility] ([facility]  Admit Date: 08/16/2016 12:12  Discharge Date: 08/20/2016 16:28  Visit Type: Inpatient  Chief complaint: Contusion of left thigh, subsequent encounter (S70.12XD)  Diagnosis: Contusion of left thigh, subsequent encounter; Pain in leg, unspecified; Acute embolism and thrombosis of unspecified deep veins of unspecified lower extremity; Anemia, unspecified; Orthostatic hypotension; Personal history of other medical   treatment; Other chest pain; Urinary tract infection, site (S70.12XD; M79.606; I82.409; D64.9; I95.1; Z92.89; R07.89; N39.0)  Alert Category:   Attending Physician: [name]  Referring Physician: CARRIER, DAVID  Consulting Physician:   Copied Physician(s):     [employer] is a not-for-profit corporation that was founded in 1997 as a   community effort to enhance the ability to share health information   electronically in the [place] area. Today, [employer]   is one of the nation????????s largest and most financially sustainable regional   health information exchange (HIE) organizations.

## 2016-08-20 NOTE — Unmapped (Signed)
--------------------------------------------------------------------------------    Attestation signed by Wanda Rowe, PT, DPT at 08/28/16 1201  Agree with physical therapy assistant note    --------------------------------------------------------------------------------    Inpatient Physical Therapy Progress Note    S: My shower is on the second floor, other than that, I can stay on the first  level.  Communicated with RN: OK to treat. Pt with IVC filter in place and therapeutic  on anticoagulation. Communicated with vascular MD, pt OK to resume outpatient PT  with no restrictions on ther-ex and activity as tolerated.  Pt semi-fowler in bed at start and end of session. Pt's parents present, RN  aware of pt status.    O:    Precautions  Activity Level:  (per RN, OK to treat)  Isolation: None  Cardiac: Telemetry  Other Precautions: thigh hematoma        Bed Mobility  Supine to Sit: Modified independent  Sit to Supine: Modified independent  Sit to Stand: Supervision;With Assistive Device (specify) (to SPC and to RW from  EOB)        Gait  Pattern: Decreased stance time L  Gait Assistance: Supervision  Assistive Device: Rolling walker  Distance (ft): 150 Feet  Additional Comments: Pt exhibited difficulty WB through LLE 2/2 increased pain;  able to complete giat training with use of RW  Stair management technique: One rail R;Step to pattern;Forward  Stair management assistance: SBA  # of Stairs: 3 (for STE)      Interventions/Exercises  Functional Activity Tolerance: Endurance does limit participation in activity  Endurance does limit participation: Rest breaks with gait;Pain limits tolerance        Pain Information    Pt noted pain in her LLE with flexion and WB.    A: Pt pleasant and agreeable to tx this afternoon. Pt exhibited difficulty amb  with SPC and was very unsteady this session 2/2 reports of elevated pain and  difficulty WB through LLE. Pt able to complete gait training with use of RW this  date. Pt  able to achieve foot flat after cues. Pt able to complete stair  negotiation to mimic STE and had no LOB and exhibited good level of safety  awareness after cues for sequencing and problem solving. Discussed bumping up  and down stairs to second level pending pain in her LLE; pt verbalized  understanding of sequencing, however, plans on remaining on main level at this  time. Encouraged use of RW until pain decreases and ROM increases to promote  stability and activity. Pt encouraged to remain active to promote healing and  prevent future clots; pt verbalized understanding. Pt states she has assistance  from her parents at this time.    Goals to be met by 08/24/16:     1. Bed mobility: Mod I --met  2. Sit to stand: mod I -not met  3. Gait X 200 ft with SPC mod I -not met  4. Up and down 15 steps with 1 HR CGA -not met (addressed STE only this session)  5. Tolerate 20 reps of TKR exercises -not addressed this session as had not  collaborated with vascular MD at time of session    P: Recommend current D/C plan of Intermittent supervision;Outpatient PT per  primary PT.  Continue with current POC per PT.    Time: 18 min GT    Charge: 1 GT    Therapist: Marlin Canary, PTA  Date: 08/20/2016  Pager: [phone]

## 2016-08-20 NOTE — Unmapped (Signed)
Medical Oncology/Hematology    Hospital  LOS: 4 days  Code Status: Full Code  Primary Oncologist: Magdalene River, MD    Oncology/hematology and Treatment Plan Update for 08/20/2016  Hx/new DVT: pt had a IVC filter placed in April due to multiple knee surgeries  CT angio negative for PE    LLE DVT 2017 did not respond to Xarelto, then switched to Lovenox responded       large hematoma to left hip/sacrum with likely supratherapeutic dosing was  supposed to be on 40 mg daily but was taking 100 mg  DVT gastrocnemius L by doppler  F/u CT hematoma starting to resorb measurements smaller    Anemia, likely related to  left gluteal hematoma while on lovenox    Reviewed imaging and labs    More discomfort in LLE hip, thigh and calf very sore when trying to ambulate and  doesn't have full ROM from her knee replacement yet  The ecchymosis has extended  past the drawn borders but no new palpable findings.  Hgb fluctuating  7.7/8.2/7.8 and IVF have been stopped due to weight gain    Stared heparin last PM, Hgb stable ~8.0 this am  I'm OK with conversion to  Eliquis(did not respond to Xarelto with her original DVT, and had a hematoma on  the Lovenox)  If she is stable on eliquis and it is working on the DVT the  continue for at least 3-6 months, if it doesn't work would go back to lovenox  and titrate the antiXa to the low therapeutic range.  Would keep the IVC filter  for now although it is retrievable    Disposition Plan  Per attending      Subjective  Subjective:  CC: left leg bruising and back pain    HPI: Wanda Rowe was admitted for anemia and hematoma from supratherapeutic doses  of lovenox. Pt states she has been off and on lovenox with dose adjustments  since April this year. She was on xarelto when she was first diagnosed in 09/2015  but in 01/2016 when she was preparing for knee surgery she had dopplers and  showed her DVT was resolving. She was then placed on SQ lovenox. She had a IVC  filter placed in March 2018. She then  had knee surgery was off lovenox prior to  and then restarted after her aurgery on a prophylactic dose that evening then  bid for 2 weeks. Its unclear what dose she actually was taking as she states  that the surgeon and Dr Selena Batten had different thoughts on treatment. She says that  she was squirting half a syringe out and giving herslf that amount daily. She  says she was traveling and dosed herself with 100 mq sq BID 2 days before and 1  day after. She said that when she was getting off the plan her left leg and her  back was painful. She was seen in the ED and  had a hematoma and was given  prednisone. The next day she saw her PCP and they recommended she stop her  anticoagulation and follow up with Dr. Selena Batten. She became short of beath and was  seen in the ED and given a unit of blood. The pain and bruising worsened so she  went to to the ED and was admitted.    Update  Overnight more discomfort LLE when ambulation hip, thigh and calf all tender and  knee hasnt fully recovered from knee replacement surgery  CT  shows the hematoma  smaller  Current Pain Level: denies  ROS 10 point systems reviewed and pertinent findings are in the HPI        Current Medications:      DULoxetine 60 mg Oral Daily  fluticasone 2 Spray Nasal Daily  gabapentin 600 mg Oral BID  heparin (porcine) 250-10,000 Units IV Bolus See Admin Instructions  ipratropium 2 Spray Nasal BID  levothyroxine 75 mcg Oral Daily      acetaminophen 650 mg Oral Q6H PRN  albuterol 2 Puff Inhalation Q6H PRN  atropine 0.5 mg Intravenous Q5 Min PRN  cyclobenzaprine 5 mg Oral TID PRN  nitroglycerin 0.4 mg Sublingual Q5 Min PRN  oxyCODONE 5 mg Oral Q4H PRN          General Multisystem Physical Exam    Patient Vitals for the past 24 hrs:   BP Temp Temp src Pulse Resp SpO2 Weight  08/20/16 0752 99/49 99 ??????F (37.2 ??????C) Oral 70 16 99 % -  08/20/16 0345 98/50 98.6 ??????F (37 ??????C) Oral 74 16 100 % 198 lb 9.6 oz  08/19/16 2320 97/54 98.2 ??????F (36.8 ??????C) Oral 81 16 100 %  -  08/19/16 1940 109/59 99 ??????F (37.2 ??????C) Oral 97 16 100 % -  08/19/16 1516 112/55 98.6 ??????F (37 ??????C) Oral 83 15 100 % -  08/19/16 1105 106/46 98.6 ??????F (37 ??????C) Oral 86 16 100 % -    Most Recent Weight: 198 lb 9.6 oz  Initial Weight: 195 lb  Body mass index is 30.2 kg/m??????.    Cardiac Rhythm: Normal sinus rhythm  Oxygen Delivery: O2 Delivery Device: None (Room air), O2 Flow Rate (l/min): 0  l/min,      Intake/Output Summary (Last 24 hours) at 08/20/16 0841  Last data filed at 08/20/16 0752   Gross per 24 hour  Intake              828 ml  Output             1240 ml  Net             -412 ml      General Appearance: Well-developed, not acutely ill, conversant  External Ears, nose, and lips  normal  Lungs: Clear, no use of accessory muscles  CV:RRR no MGR.  Abd: Soft, NT/ND.  No HSM. No ascites  Ext: No CC. Left leg +1 swelling. Left hip,buttocks, posterior thigh, and  posterior knee, and inner thigh with large resolving ecchymosis/hematoma but  extending past the inked borders  No new palpable findings  Psychiatric: Normal insight/judgement. Oriented to person, place and time  Skin: No rashes or petechia to inspection.  No nodules on palpation.    ECOG Performance Status: (1) Restricted in physically strenuous activity,  ambulatory and able to do work of light nature      Data Reviewed    Laboratory Data  CBC:  Recent Labs     08/19/16   0534  08/19/16   1656  08/20/16   0610  WBC   --   6.3   --  HGB  7.8*  8.1*  8.0*  HCT  22.8*  24.2*  23.6*  MCV  89.2  90.2  89.2  PLT   --   171   --    Renal/Hepatic:  Recent Labs     08/18/16   0646  08/19/16   0534  08/20/16   0610  NA  137  138  138  K  3.8  4.1  3.5  CO2  22  25  24   BUN  16  17  11   CREATININE  0.61  0.63  0.58  GLU  99  103*  105*  CALCIUM  8.7  8.8  8.9    Invalid input(s): 3    Recent Micro Results (Last 14 days):  Recent Results (from the past 336 hour(s))  ROUTINE URINE CULTURE   Collection Time: 08/16/16  3:17 PM  Result Value Ref Range   Culture  Result: Mixed Skin/Urogenital Flora.  No Further Workup.  BLOOD CULTURE (AEROBIC & ANAEROBIC)   Collection Time: 08/17/16 12:29 AM  Result Value Ref Range   Culture Result: No Growth To Date  BLOOD CULTURE (AEROBIC & ANAEROBIC)   Collection Time: 08/17/16  5:10 AM  Result Value Ref Range   Culture Result: No Growth To Date      Recent Radiology Results:  Results in EPIC reviewed    CT angio neg for PE  Doppler new bwlow the knee DVT

## 2016-08-21 NOTE — Unmapped (Signed)
--------------------------------------------------------------------------------  Attestation signed by Twana First., MD at 09/15/16 [phone]  Attending Note:  I have seen and examined the patient. I have discussed the case and agree with  the assessment and plan.  In addition please refer to my note prior to discharge for any further  recommendations.    [name], MD  --------------------------------------------------------------------------------      Discharge Summary    Patient: Wanda Rowe  Age: 56 y.o.  MRN: [id number]  CSN: [id number]    Date of Admission: 08/16/2016  Date of Discharge: 08/20/2016  Attending Physician: No att. providers found    Primary Care Physician: Irena Cords., MD  [address] [place] [address]  Phone: [phone]  Fax:  [phone]    Diagnoses Present on Admission    Present on Admission:  ?????? Hematoma of thigh, left, subsequent encounter  ?????? Clotting disorder (CMS HCC)  ?????? History of total knee replacement (TKR)      Discharge Diagnoses    Hospital Problems/Discharge Diagnosis   Diagnosis  ?????? Hematoma of thigh, left, subsequent encounter  ?????? History of total knee replacement (TKR)  ?????? Clotting disorder (CMS [facility])    Resolved Hospital Problems   Diagnosis Date Noted Date Resolved  No resolved problems to display.      Operations/Procedures Performed (include dates)    Other Procedures / Pertinent Imaging:    CT-ANGIO LOWER EXT W/WO LT  08/29/2016   History: Evaluate for expanding hematoma in the left thigh.  COMPARISON: CTA abdomen and pelvis 08/16/2016.  IMPRESSION:  1. A 10 cm intramuscular hematoma in the left gluteus medius muscle is  unchanged since 08/16/2016 and without evidence of active extravasation.  2. No other hematoma is identified within the left leg from the level of  the pelvis through the left knee. The lower left leg was not included.  3. Extensive streak artifact from left knee arthroplasty obscures the left  popliteal fossa.  4.  Normal-appearing arteries of the left pelvis and left thigh.  5. Evidence of mildly hyperattenuating thrombus within the left common  iliac vein unchanged since 08/16/2016. IVC filter is noted on previous CT  but not included on current study.    VASCI-VEN DUPLEX LE LTD LT  08/29/2016  FINAL IMPRESSIONS  1. There is acute, occlusive deep venous thrombosis involving the left   gastrocnemius, posterior tibial, and peroneal veins.  2. There is no other evidence of deep or superficial venous thrombosis in   the left lower extremity.  3.  There appears to be no significant progression of thrombosis in the   left lower extremity, relative to the previous bilateral lower extremity   exam of 08/17/2016.      Consulting Services (include reason)    IP CONSULT TO VASCULAR SURGERY    Allergies    Allergies  Allergen Reactions  ?????? Azithromycin    **DOES NOT WORK**    ?????? Succinylcholine Other (See Comments)    Delayed paralysis  Difficulty w/ muscle movement the day after surgery  Other reaction(s): Muscle Aches  Difficulty w/ muscle movement the day after surgery  ?????? Sulfasalazine Rash  ?????? Tegaderm Rash  ?????? Silver Rash  ?????? Sulfa (Sulfonamide Antibiotics) Swelling and Rash  ?????? Sulfanilamide Rash    Sulfonamides      Discharge Medications    Discharge Medication List as of 08/20/2016  3:22 PM    START taking these medications   Details  !! apixaban (ELIQUIS) 5  mg Tablet Take 2 Tabs by mouth 2 times daily for 7 days.  Take 2 tabs by mouth 2 times daily for 7 days.Disp-28 Tab, R-0, Print    !! apixaban (ELIQUIS) 5 mg Tablet Take 1 Tab by mouth 2 times daily for 30  days.Disp-60 Tab, R-0, Print    !! apixaban (ELIQUIS) 5 mg Tablet Take 1 Tab by mouth 2 times daily for 150  days. Take 1 tab my mouth 2 times daily for 5 months.Disp-300 Tab, R-0, Normal     !! - Potential duplicate medications found. Please discuss with provider.    CONTINUE these medications which have NOT CHANGED   Details  albuterol (PROVENTIL HFA) 90  mcg/actuation HFA Aerosol Inhaler Take 2 Puffs by  inhalation every 6 hours as needed.Historical Med    CALCIUM CITRATE PO Take  by mouth daily.Historical Med    CHOLECALCIFEROL, VITAMIN D3, PO Take  by mouth.Historical Med    cyclobenzaprine (FLEXERIL) 10 mg tablet Take 10 mg by mouth 3 times daily as  needed for muscle spasm.Historical Med    DULoxetine (CYMBALTA) 60 mg Capsule, Delayed Release(E.C.) Take 60 mg by mouth  daily. Reported on 6/21/2017Historical Med    estrogens, conjugated, (PREMARIN) 0.625 mg/gram Cream Insert into vagina twice a  week.Disp-42.5 g, R-1, Normal    fluticasone (FLONASE) 50 mcg/Actuation NA nasal spray Spray 2 Sprays into nose  daily. Each nostrilHistorical Med    gabapentin (NEURONTIN) 600 mg tablet Take 600 mg by mouth 2 times  daily.Historical Med    ipratropium (ATROVENT) 0.03 % NA nasal spray Spray 2 Sprays into nose 2 times  daily. Each nostril Historical Med    lactobacillus rhamnosus (CULTURELLE) 10 billion cell Take 1 Cap by mouth  daily.Historical Med    levothyroxine (SYNTHROID) 75 mcg PO tablet Take 66 mcg by mouth daily.Historical  Med    oxyCODONE-acetaminophen (PERCOCET) 5-325 mg per tablet Take 1-2 Tabs by mouth  every 6 hours as needed for Pain.Historical MedFor acute pain prescriptions -  order no greater than 7 days (adult) or 5 days (minor) unless justification is  documented in chart. Note that [place] limits acute opioid prescriptions to 3  days.      STOP taking these medications     enoxaparin (LOVENOX) 40 mg/0.4 mL injection Comments:  Reason for Stopping:            Discharge Exam    BP 92/66    Pulse 90    Temp 98.7 ??????F (37.1 ??????C) (Oral)    Resp 16    Ht 5' 8  (1.727 m)    Wt 198 lb 9.6 oz (90.1 kg)    LMP 04/12/2008    SpO2 100%    BMI  30.20 kg/m??????  Physical Exam  General: laying in bed, not in acute distress  Head: atraumatic, normocephalic  Heart: RRR, no murmurs, rubs or gallops  Lungs: Clear to auscultation bilaterally, no wheezes or crackles  Abdomen:  soft, non-tender, normal bowel sounds  Extremities: left calf slightly larger in diameter compared to right,  non-erythematous, no pitting edema  Neuro: moves all 4 limbs spontaneously, CN 2-12 grossly intact, A and O x3  Reason for Admission    Markeia Harkless S Hartland is a 56 y.o. female with PMH of breast CA and VTE s/p IVC filter  who presented with hematoma of left thigh .    Hospital Course    Patient is a 56 y.o. female with past medical history of breast CA  and VTE who  presents with hematoma of left thigh. Recent history is notable for IVC filter  on 4/20, L total knee on 4/24, manipulation of L knee under anesthesia on 6/5,  and she took two flights during the week of 7/2 for which she was on lovenox for  dvt ppx. On 7/6, she was unable to walk 2/2 pain of the LLE. It worsened to the  point where the pain was constant and there was posterior L thigh numbness, and  she was seen at [facility] ED on 7/7 and treated for muscle strain. On 7/10  she was seen by her PCP for ED followup and diagnosed with a hematoma.    Her lovenox was stopped on 7/13, and on 7/14, the patient was seen at [facility] ED  with continued LLE pain as well as SOB, palpitations, and dizziness upon  standing. Hb at presentation was 7.4, and she was transfused with 1u pRBCs. She  was stable for discharge and felt good enough to drive home. On the evening of  7/15, low grade fever, tachycardia, and SOB remitted, and she went back to the  [facility] ED, where she was seen and transferred to [facility].    Duplex US of the left lower extremity was performed on 7/15 and revealed an  occlusive DVT involving the left gastrocnemius, posterior tibial, and peroneal  veins. CT angiography of the chest was performed and was negative for PE. CT  angiography of the left thigh ws performed and revealed intramuscular hematoma  in the left gluteus medius muscle as well as evidence of mild hyper attenuating  thrombus within the left common iliac vein.    Once  the hematoma was deemed stabilized with repeat imaging, the patient was  given a heparin bolus and drip overnight on 7/18. The patient was discharged on  Eliquis to be taken for a total duration of 6 months. The first seven days 10mg   BID loading dose, followed by 5mg  BID for 6 months.    Condition on Discharge    1. Functional Status: Normal   Describe limitations, if any: ambulate as tolerated    2. Mental Status: Intact   Describe limitations, if any: None    3. Dietary Restrictions / Tube Feeding / TPN  No orders of the defined types were placed in this encounter.      .4. Discharge specific orders:  Take Eliquis as instructed  Follow-up with Hematology in 1-2 weeks  Follow-up with PCP in 1-2 weeks    5. Core measures followed: (if this is a core measure patient)  Discharge Weight: 198 lb 9.6 oz (90.1 kg)    6. Code status:  Full Code    Disposition    Home or Self Care    Pending results    LAB ORDERS in process or preliminary   Date and Time Order Name Status Description Specimen ID Source   08/17/2016 0510 Blood culture-PERIPHERAL DRAW from a different site or at a  different time (aerobic & anaerobic) Preliminary  [id number] Peripheral   08/17/2016 0029 Blood Culture PERIPHERAL DRAW (aerobic & anaerobic) Preliminary  [id number] Peripheral        Follow-Up Appointments    Future Appointments  Date Time Provider Department Center  09/07/2016 9:00 AM Arnoldo Hooker., MD [facility]  09/07/2016 11:15 AM Fawn Kirk., MD [facility]  09/07/2016 2:00 PM Caligaris, Vickey Huger., MD OB/GYN [facility]  Signed:  [name], MD  08/21/2016, 4:52 PM

## 2016-09-07 NOTE — Unmapped (Signed)
Follow Up Form  Chief Complaint  Patient presents with  ?????? Breast Cancer Hx    55mo. F/U Rt Invasive ductal carcinoma  ?????? Imaging Results    Bilat Mammo w/ TOMO and Lt breast U/S done 08/03/16      Ms. Wanda Rowe is a 56 year old woman, who is here for her follow up up visit for  right breast cancer. She is not taking any endocrine therapy, given that her  tumor was ER/PR negative.    She reports having a left knee replacement with Dr. Jaynie Collins in April. She got 2 dvts  in her left leg. She had worsening muscle strain and increased bruising. She was  on Xarelto with no response, then Lovenox and had dvt on the medication. Now on  Eliquis. She had an IVC filter placed in April. She feels achy all over, but  thinks it is from the Eliquis.        She did undergo imaging, recently. This included bilateral dx tomosynthesis with  left breast ultrasound. Results are as follows:    Exam type: MAMM-DIAG BILAT W/ TOMO & UNILAT [place]  Indication: Right breast lumpectomy.  Comparison: 07/24/2015 and priors      Technique:  Routine bilateral mammogram CC and MLO views were acquired with direct  digital imaging. Computer aided detection was utilized. 3-D imaging were  obtained. Left breast targeted ultrasound        Findings:  The breast tissue is heterogeneously dense  (approximately 51-75%  glandular) which could obscure detection of small masses.    Right breast: Stable postlumpectomy changes in the right breast 12:00  radian 7 cm from the nipple with evidence of fat necrosis.    Left breast:. An asymmetry was seen in the left central mid breast which  resolved with compression. Targeted ultrasound of the left breast  demonstrated no abnormality. There is no suspicious mass,  microcalcifications or architectural distortion in the right breast. There  is no significant interval change from the previous study.      Impression:  No suspicious findings. Routine annual mammogram is recommended.    BI-RADS 2: Benign findings.        Patient's  information was entered into a reminder system with a target  date for the next mammogram.      Signed By: Michae Kava MD, Kirti      HX:    BREAST CANCER HX  Tumor Size: Right IDC, stage I  T: T1b  N: N0  M: cM0  Estrogen: negative  Progesterone: negative  Her 2 neu: positive by IHC  Oncotype Score: None  Chemotherapy: neoadjuvantTCHP (completed 12/26/12)  Radiation Therapy: Right WBR without Boost (42.56Gy in 16 Fx completed 04/19/14)  Endocrine Therapy: Evista  Breast Surgery Type/Date: right wire loc lumpectomy w/SNB  DX Date: 08/29/12    OB-GYN  Age of Menarche: 41  G: 1  P: 1  Age of 1st Delivery: 62  Menopause Age: 14 (Partial hysterectomy)  BCP Use: yes, 5 years  Fertility Treatment: no  Hormone Replacement: no    IMAGING/BIOPSY HX  Age of 1st mammogram: 78  Date of last mammogram: 08/03/16  Frequency : annually  Institution : [facility]  Past Biopsies: yes, 2014, IDC    BREAST CA RISK ASSESSMENT  Family HX Breast CA: No  Family HX Ovarian CA: No  Family HX Other CAs: Yes (MGM bladder 60's, MGF colon 60's)  BRCA Testing?: No    Imaging performed: Bilateral dx tomosynthesis and  left breast ultrasound    Medical/Surgical/Family/Social history updates since previous visit: Her  daughter is going to [place] at [employer] this fall.    Review of Systems  Constitutional: Negative.  Negative for chills, diaphoresis, fever,  malaise/fatigue and weight loss.  Respiratory: Negative for cough and shortness of breath.  Cardiovascular: Negative for chest pain and leg swelling.  Gastrointestinal: Negative for abdominal pain, nausea and vomiting.  Musculoskeletal: Negative for back pain, joint pain and neck pain.  Skin: Negative for itching and rash.  Neurological: Negative for weakness and headaches.  Psychiatric/Behavioral: Negative for depression and suicidal ideas. The patient  is not nervous/anxious.  All other systems reviewed and are negative.      Physical Exam  Constitutional: She is  oriented to person, place, and time. She appears  well-developed and well-nourished.  HENT:  Head: Normocephalic and atraumatic.  Eyes: EOM are normal.  Neck: Normal range of motion. Neck supple. No JVD present. No tracheal deviation  present. No thyromegaly present.  Cardiovascular: Normal rate.  Pulmonary/Chest: Effort normal. No stridor. No respiratory distress. She  exhibits no tenderness. Right breast exhibits skin change. Right breast exhibits  no inverted nipple, no mass, no nipple discharge and no tenderness. Left breast  exhibits skin change. Left breast exhibits no inverted nipple, no mass, no  nipple discharge and no tenderness. Breasts are symmetrical.      Bilateral breasts with everted nipples. All incisions well healed, bilaterally.  No palpable masses and no other skin changes.    Abdominal: Soft. She exhibits no distension and no mass. There is no tenderness.  There is no rebound and no guarding.  Musculoskeletal: Normal range of motion. She exhibits no edema or tenderness.  Lymphadenopathy:       Head (right side): No submandibular adenopathy present.       Head (left side): No submandibular adenopathy present.    She has no cervical adenopathy.       Right cervical: No superficial cervical and no deep cervical adenopathy  present.       Left cervical: No superficial cervical and no deep cervical adenopathy  present.    She has no axillary adenopathy.       Right axillary: No pectoral and no lateral adenopathy present.       Left axillary: No pectoral and no lateral adenopathy present.       Right: No supraclavicular adenopathy present.       Left: No supraclavicular adenopathy present.  Neurological: She is alert and oriented to person, place, and time. No cranial  nerve deficit. Coordination normal.  Skin: Skin is warm and dry. No rash noted. No erythema. No pallor.  Psychiatric: She has a normal mood and affect. Her behavior is normal. Judgment  and thought content normal.  Nursing note and  vitals reviewed.    Vitals:   09/07/16 0916  BP: 117/70  Pulse: 73  Temp: 98.6 ??????F (37 ??????C)  TempSrc: Oral  Weight: 192 lb (87.1 kg)  Height: 5' 8 (1.727 m)      Assessment: 56 year old woman 4 years out from dx of right IDC, treated with  wire localized lumpectomy, slnb, chemotherapy and radiation. Recently right knee  replacement and two dvts. On Eliquis. Imaging showed abnormality on left breast,  which compressed out on further views.  Recommended breast MRI, patient agreed.    Plan: Breast MRI, now with Rx Valium given. Will call with results. Continue  monthly self exam.  Data reviewed: x-ray, review old MR and/or Hx other source and/or discussion of  case with another provider and Drs visual test/study    Patient's Medications  New Prescriptions   DIAZEPAM (VALIUM) 5 MG TABLET    Take one tablet 60 minutes prior to procedure.  Can take half of next dose as needed just prior to procedure.      Order Dose: --  Current Medications   ALBUTEROL (PROVENTIL HFA) 90 MCG/ACTUATION HFA AEROSOL INHALER    Take 2 Puffs  by inhalation every 6 hours as needed.      Order Dose: 2 Puffs   APIXABAN (ELIQUIS) 5 MG TABLET    Take 1 Tab by mouth 2 times daily for 150  days. Take 1 tab my mouth 2 times daily for 5 months.      Order Dose: 5 mg   CALCIUM CITRATE PO    Take  by mouth daily.      Order Dose: --   CHOLECALCIFEROL, VITAMIN D3, PO    Take  by mouth.      Order Dose: --   CYCLOBENZAPRINE (FLEXERIL) 10 MG TABLET    Take 10 mg by mouth 3 times daily as  needed for muscle spasm.      Order Dose: 10 mg   DULOXETINE (CYMBALTA) 60 MG CAPSULE, DELAYED RELEASE(E.C.)    Take 60 mg by  mouth daily. Reported on 07/24/2015      Order Dose: 60 mg   ESTROGENS, CONJUGATED, (PREMARIN) 0.625 MG/GRAM CREAM    Insert into vagina  twice a week.      Order Dose: --   FLUTICASONE (FLONASE) 50 MCG/ACTUATION NA NASAL SPRAY    Spray 2 Sprays into  nose daily. Each nostril      Order Dose: 2 Sprays   GABAPENTIN (NEURONTIN) 600 MG TABLET     Take 600 mg by mouth 2 times daily.      Order Dose: 600 mg   IPRATROPIUM (ATROVENT) 0.03 % NA NASAL SPRAY    Spray 2 Sprays into nose 2  times daily. Each nostril      Order Dose: 2 Sprays   LACTOBACILLUS RHAMNOSUS (CULTURELLE) 10 BILLION CELL    Take 1 Cap by mouth  daily.      Order Dose: 1 Cap   LEVOTHYROXINE (SYNTHROID) 75 MCG PO TABLET    Take 66 mcg by mouth daily.      Order Dose: 66 mcg   OXYCODONE-ACETAMINOPHEN (PERCOCET) 5-325 MG PER TABLET    Take 1-2 Tabs by  mouth every 6 hours as needed for Pain.      Order Dose: 1-2 Tabs      Follow up: 6 months    Imaging needed: No imaging    I spent 20 minutes face to face with the patient, greater than 75% of which was  dedicated toward counseling.    Amie Portland, CMA , acting as a scribe for Dr. Ammie Ferrier    The above note has been reviewed and reflects the work and decisions made by the  physician.  Victorino Dike B. Vanice Sarah, MD, FACS    09/07/2016    [facility]  [facility] [place]  [address], [address]  [place] [address]  Dept: [phone]  Dept Fax: [phone]  Loc: [phone]  Loc Fax: [phone]

## 2016-09-07 NOTE — Unmapped (Signed)
Subjective  Subjective:    Patient ID: Pricilla Holm  Age: 56 y.o. (DOB: 08-14-1960)  Ethnicity: Non-Hispanic  Race: White or Caucasian  Gender: female    Chief Complaint:  Chief Complaint  Patient presents with  ?????? AWV (Initial)    02/20/2016      HPI    Location:  [facility]  Signs/Symptoms: repeat pap  Severity:  1  Quality:  na  Duration:  6 mos  Prior Treatment: none    Past Medical History:  Diagnosis Date  ?????? Anemia   in past  ?????? Anxiety  ?????? Arthritis   knees  ?????? Asthma  ?????? Awareness under anesthesia   woke up during endoscopy  ?????? Back problem   occ back ache  ?????? Cancer (CMS HCC)   right breast- chemo. and radiation in 2014 & 2015, hx of Lumpectomy  ?????? Chest pain   when under stress-testing done & no cause of chest pain was found  ?????? Complication of anesthesia   rxn to succinylcholine-day after surgery, pt  had difficulty w/ muscle  movement-dr j collins in anesthesia dept notified- Informed Dr. Unknown Foley, [facility]  for upcoming procedure on 07/07/16  ?????? Dysplasia  ?????? GERD (gastroesophageal reflux disease)   no meds  ?????? Hernia   hiatal-resolved with wt loss surg  ?????? Hypertension   in past & no longer taking medication  ?????? Irregular heart beat   MVP & heart murmur-used to take pre-dental antibiotics  ?????? Liver disorder   pt told she has a fatty liver-resolved per pt  ?????? Mobility impaired 07/01/2016   uses cane currently d/t recent s/p left knee replacement on 05/26/16  ?????? Occlusive thrombus 2017   dvts in past; IVC filter inserted 05/22/16  ?????? Osteopenia  ?????? RSD (reflex sympathetic dystrophy)  ?????? Suspected sleep apnea 07/01/2016   pt. states prior to knee replacement when she was given preop meds, she heard  the nurses calling her name and states that something had dropped suddenly: pt.  thinks it may have been her 02. She states she was advised to get a sleep study  in the future. pt. is not sure what med caused this to happen prior to  surgery  on 05/26/16.  ?????? Thyroid disorder   hypothyroidism  ??????  Venous thrombosis and embolism    OB History  Gravida Para Term Preterm AB Living  1 1       1   SAB TAB Ectopic Multiple Live Births      # Outcome Date GA Lbr Len/2nd Weight Sex Delivery Anes PTL Lv  1 Para      Past Surgical History:  Procedure Laterality Date  ?????? HX BREAST BIOPSY  july 2014   right  ?????? HX BREAST LUMPECTOMY  02-2013   right; also bilat reduction; back to surg next day for post op bleeding  ?????? HX COLPOSCOPY  ?????? HX FOOT SURGERY   right  ?????? HX GUM SURGERY   done for receding gums  ?????? HX HYSTERECTOMY, TOTAL ABDOMINAL  ?????? HX KNEE SURGERY   left x 3, right x 1  ?????? HX LAPAROSCOPY  ?????? HX OTHER SURGICAL HISTORY  jan 2014   gastric sleeve  ?????? HX OTHER SURGICAL HISTORY   ins. portacath  ?????? HX TOTAL KNEE ARTHROPLASTY Left 05/26/2016  ?????? HX UPPER GI ENDOSCOPY   mult in past due to stomach polyps  ?????? HX VAGINAL DELIVERY  ?????? HX VASCULAR SURGERY  05/22/2016   IVC filter  ??????  REMOVAL OF OVARY/TUBE(S)   bilateral salpingo-oophorectomy    Social History  Substance Use Topics  ?????? Smoking status: Never Smoker  ?????? Smokeless tobacco: Never Used  ?????? Alcohol use Yes     Comment: very occasionally    History  Sexual Activity  ?????? Sexual activity: Yes  ?????? Partners: Male    Social History    Social History Narrative  ?????? No narrative on file    Family History  Problem Relation Age of Onset  ?????? High Blood Pressure Mother  ?????? Diabetes Father  ?????? High Blood Pressure Father  ?????? Heart Problems Paternal Grandfather       mi  ?????? Cancer Other       unspecified grandmother  ?????? Diabetes Other       unspecified grandmother  ?????? Stroke Other       unspecified grandmother  ?????? Colon Cancer Maternal Grandfather 60  ?????? Cancer Maternal Grandmother 60       Bladder  ?????? Anesthesia Complications Neg Hx      Allergy:  Allergies  Allergen Reactions  ?????? Azithromycin    **DOES NOT WORK**    ?????? Succinylcholine Other (See Comments)    Delayed paralysis  Difficulty w/ muscle movement the day after surgery  Other  reaction(s): Muscle Aches  Difficulty w/ muscle movement the day after surgery  ?????? Sulfasalazine Rash  ?????? Tegaderm Rash  ?????? Silver Rash  ?????? Sulfa (Sulfonamide Antibiotics) Swelling and Rash  ?????? Sulfanilamide Rash    Sulfonamides      Medications:  Current Outpatient Prescriptions  Medication Sig Dispense Refill  ?????? albuterol (PROVENTIL HFA) 90 mcg/actuation HFA Aerosol Inhaler Take 2 Puffs by  inhalation every 6 hours as needed.  ?????? apixaban (ELIQUIS) 5 mg Tablet Take 1 Tab by mouth 2 times daily for 150 days.  Take 1 tab my mouth 2 times daily for 5 months. 300 Tab 0  ?????? CALCIUM CITRATE PO Take  by mouth daily.  ?????? CHOLECALCIFEROL, VITAMIN D3, PO Take  by mouth.  ?????? cyclobenzaprine (FLEXERIL) 10 mg tablet Take 10 mg by mouth 3 times daily as  needed for muscle spasm.  ?????? DULoxetine (CYMBALTA) 60 mg Capsule, Delayed Release(E.C.) Take 60 mg by mouth  daily. Reported on 07/24/2015  ?????? estrogens, conjugated, (PREMARIN) 0.625 mg/gram Cream Insert into vagina twice  a week. (Patient not taking: Reported on 09/07/2016) 42.5 g 1  ?????? fluticasone (FLONASE) 50 mcg/Actuation NA nasal spray Spray 2 Sprays into nose  daily. Each nostril  ?????? gabapentin (NEURONTIN) 600 mg tablet Take 600 mg by mouth 2 times daily.  ?????? ipratropium (ATROVENT) 0.03 % NA nasal spray Spray 2 Sprays into nose 2 times  daily. Each nostril  ?????? lactobacillus rhamnosus (CULTURELLE) 10 billion cell Take 1 Cap by mouth  daily.  ?????? levothyroxine (SYNTHROID) 75 mcg PO tablet Take 66 mcg by mouth daily.  ?????? oxyCODONE-acetaminophen (PERCOCET) 5-325 mg per tablet Take 1-2 Tabs by mouth  every 6 hours as needed for Pain.    No current facility-administered medications for this visit.      ROS        Objective:  BP 118/74 (BP Site: Left arm, BP Position: Sitting, BP Cuff Size: Adult)    Ht  5' 8 (1.727 m)    Wt 192 lb (87.1 kg)    LMP 04/12/2008    BMI 29.19 kg/m??????  Physical Exam  Abdominal: There is no tenderness.  Genitourinary: Vagina normal and  uterus normal.  Genitourinary Comments: No masses pap repeated        Assessment:      ICD-10-CM  1. Screening for HPV (human papillomavirus) Z11.51 PAP WITH HPV DNA  2. Abnormal Pap smear of vagina R87.629 PAP WITH HPV DNA  3. Malignant neoplasm of right female breast, unspecified estrogen receptor  status, unspecified site of breast (CMS HCC) C50.911  4. 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 Clotting disorder (CMS HCC) D68.9  6. History of total left knee replacement Z96.652      Plan:    Orders Placed This Encounter  Procedures  ?????? Thin Prep Pap Cotest HPV - Over 30    Encounter Meds      Treatment Recommendations:  Call with pt  Pt with h/o breast cancer and had left knee replacement and  develoed dvt and on blood thinners prior pap was ascus with hpv    Total time with pt was 15 min with >50% of time spent face to face counseling  pap and breast ca    Follow-up and Disposition   Return in about 6 months (around 03/10/2017).

## 2016-09-07 NOTE — Unmapped (Addendum)
Progress Notes by Ophelia Shoulder., RMA at 09/07/16 1115     Author:  Ophelia Shoulder., RMA Service:  Orthopedics Author Type:  Medical Assistant    Filed:  09/11/16 1129 Encounter Date:  09/07/2016 Status:  Signed    Editor:  Fawn Kirk., MD (Physician)  Cosigner:  Fawn Kirk., MD at 09/11/16 1408       Subjective [JB.1]   Subjective:   Patient ID:[JB.2] Wanda Rowe[JB.1]  Age:[JB.2] 55 y.o.[JB.1] (DOB:[JB.2] 09-03-1960[JB.1])[JB.2]  Ethnicity: Non-Hispanic  Race: White or Caucasian[JB.1]  Gender:[JB.2] female[JB.1]    Chief Complaint:[JB.2]  Chief Complaint     Patient presents with     ??????? Total Knee Replacement      Following up with left tka done on 05/26/16, left knee manipulation done on 07/07/16, she has developed 2 DVT's in the leg[JB.1]            HPI:[JB.2] She is here today for follow-up on her left total knee replacement with manipulation performed on July 07, 2016.  She recently had 2 blood clots in her leg and now she feels like her knee is stiff all of the time.  She is currently taking   Eliquis.  She got the blood clots while she was taking Lovenox.[JB.1]      Ortho Exam:[JB.2] Left knee has mild warmth and swelling, no erythema or effusion.  Range of motion shows full extension but stiff in flexion.  She is ambulating well.[JB.1]      ROS:[JB.2]       Objective      Review of Systems  Musculoskeletal: (-) Joint Pain, Knee Pain, Extremity Swelling  Constitutional: (-) Fever, (-) Chills  Neurological: (-) Dizziness  GI: (-) Nausea, (-) Vomiting  HENT: (-) Headaches[JB.1]      Patient Vitals:[JB.2]  Vitals  Height: 5' 8 (172.7 cm)  Weight: 190 lb (86.2 kg)[JB.1]         X-Ray/Injection In Clinic Procedure Notes[JB.2]         In Clinic Administered Meds Billing Data    None[JB.1]         Assessment/Impression:[JB.2]     Encounter Diagnoses      Code  Name Primary?   ??????? Z47.1, W10.272 Aftercare following left knee joint replacement surgery Yes[JB.1]         Plan:[JB.2]   No orders of the  defined types were placed in this encounter.    I advised her that blood is very irritating and once this all resolved she will continue to improve.  I advised that she continue working with it and still use ice a lot.  I informed her that she should do ankle pumps often and get a copper fit brace to   use during the day.  I will see her back in 4 months with a new x-ray.  All questions answered and understood.[JB.1]      I, [name], RMA am scribing for, and in the presence of Dr. George Ina, including xray interpretation if performed.[JB.2]    I, George Ina, MD personally performed the services described in the documentation as scribed by [name], RMA in my presence, and confirm it is both accurate and complete.[EL.1]    Please note that documentation was completed using dragon speak and some errors may have occurred and have not been identified.  Please check the chart for addendums.  If there are major errors that you have concern about please contact the office for  clarification.[JB.2]               Electronically signed by Fawn Kirk., MD on 09/11/16 1408.   Attribution Key    EL.1 - Fawn Kirk., MD on 09/11/16 1129  JB.1 - Ophelia Shoulder., RMA on 09/10/16 1133  JB.2 - Ophelia Shoulder., RMA on 09/07/16 1239          Revision History      Date/Time User Provider Type Action   > 09/11/16 1129 Fawn Kirk., MD Physician Sign    09/10/16 1135 Samara Deist Lise Auer., RMA Medical Assistant Sign at close encounter

## 2016-09-07 NOTE — Unmapped (Signed)
Spoke with Kennyth Arnold in MRI and verified that both the ALN Vena cava filter and the  Zimmer Persona Knee are MRI compatible .  Will schedule MRI as ordered.

## 2016-09-18 LAB — COMPREHENSIVE METABOLIC PANEL
A/G Ratio: 1.8 (ref 1.2–2.2)
ALT: 17 IU/L (ref 0–32)
AST: 20 IU/L (ref 0–40)
Albumin: 4.6 g/dL (ref 3.5–5.5)
Alkaline Phosphatase: 67 IU/L (ref 39–117)
BUN/Creatinine Ratio: 18 (ref 9–23)
BUN: 12 mg/dL (ref 6–24)
CO2: 22 mmol/L (ref 20–29)
Calcium: 9.9 mg/dL (ref 8.7–10.2)
Chloride: 102 mmol/L (ref 96–106)
Creatinine: 0.65 mg/dL (ref 0.57–1.00)
GFR MDRD Af Amer: 116 mL/min/{1.73_m2} (ref >59)
Globulin, Total: 2.5 g/dL (ref 1.5–4.5)
Glucose: 105 mg/dL (ref 65–99)
Potassium: 4 mmol/L (ref 3.5–5.2)
Sodium: 141 mmol/L (ref 134–144)
Total Bilirubin: 0.4 mg/dL (ref 0.0–1.2)
Total Protein: 7.1 g/dL (ref 6.0–8.5)
eGFR Non-Afr. American: 100 mL/min/{1.73_m2} (ref >59)

## 2016-09-18 LAB — CBC/DIFF AMBIGUOUS DEFAULT
Basophils Absolute: 0 10*3/uL (ref 0.0–0.2)
Basophils Relative: 0 %
Eosinophils Absolute: 0.1 10*3/uL (ref 0.0–0.4)
Eosinophils Relative: 1 %
Hematocrit: 36.6 % (ref 34.0–46.6)
Hemoglobin: 12.5 g/dL (ref 11.1–15.9)
Immature Granulocytes Absolute: 0 10*3/uL (ref 0.0–0.1)
Immature Granulocytes: 0 %
Lymphocytes Absolute: 1.5 10*3/uL (ref 0.7–3.1)
Lymphocytes Relative: 20 %
MCH: 29.1 pg (ref 26.6–33.0)
MCHC: 34.2 g/dL (ref 31.5–35.7)
MCV: 85 fL (ref 79–97)
Monocytes Absolute: 0.4 10*3/uL (ref 0.1–0.9)
Monocytes Relative: 5 %
Neutrophils Absolute: 5.7 10*3/uL (ref 1.4–7.0)
Neutrophils Relative: 74 %
Platelets: 223 10*3/uL (ref 150–379)
RBC: 4.29 x10E6/uL (ref 3.77–5.28)
RDW: 15.5 % (ref 12.3–15.4)
WBC: 7.7 10*3/uL (ref 3.4–10.8)

## 2016-09-18 LAB — HEMOGLOBIN A1C: Hemoglobin A1C: 4.9 % (ref 4.8–5.6)

## 2016-09-18 LAB — VITAMIN B12: Vitamin B-12: 653 pg/mL (ref 232–1245)

## 2016-09-18 LAB — IRON: Iron: 73 ug/dL (ref 27–159)

## 2016-09-18 LAB — T4, FREE: T4, Free (Direct): 1.03 ng/dL (ref 0.82–1.77)

## 2016-09-18 LAB — VITAMIN D 25 HYDROXY: Vit D, 25-Hydroxy: 46.3 ng/mL (ref 30.0–100.0)

## 2016-09-18 LAB — FERRITIN: Ferritin: 87 ng/mL (ref 15–150)

## 2016-09-18 LAB — T3: T3, Total: 121 ng/dL (ref 71–180)

## 2016-09-18 LAB — TSH: TSH: 2.14 u[IU]/mL (ref 0.450–4.500)

## 2016-09-22 ENCOUNTER — Ambulatory Visit: Admit: 2016-09-22 | Discharge: 2016-09-22 | Payer: PRIVATE HEALTH INSURANCE

## 2016-09-22 DIAGNOSIS — E039 Hypothyroidism, unspecified: Secondary | ICD-10-CM

## 2016-09-22 MED ORDER — DULoxetine (CYMBALTA) 60 MG capsule
60 | ORAL_CAPSULE | Freq: Every day | ORAL | 5 refills | Status: AC
Start: 2016-09-22 — End: 2017-01-14

## 2016-09-22 MED ORDER — cyanocobalamin (B-12 DOTS) 500 MCG tablet
500 | ORAL_TABLET | Freq: Every day | ORAL | 0 refills | Status: AC
Start: 2016-09-22 — End: 2017-01-14

## 2016-09-22 MED ORDER — UNABLE TO FIND
ORAL_CAPSULE | 0 refills | Status: AC
Start: 2016-09-22 — End: 2017-05-25

## 2016-09-22 MED ORDER — gabapentin (NEURONTIN) 600 MG tablet
600 | ORAL_TABLET | Freq: Three times a day (TID) | ORAL | 5 refills | Status: AC
Start: 2016-09-22 — End: 2017-01-14

## 2016-09-22 NOTE — Progress Notes (Signed)
[facility] [address]  [place] [address]    Name:  Wanda Rowe Date of Birth: Jan 20, 1961 (56 y.o.)   MRN: [id number]    Date of Service:  09/22/2016     Subjective:     Chief Complaint   Patient presents with    Anxiety    Depression    Hyperlipidemia     History of Present Illness:  Wanda Rowe is a(n) 56 y.o. female here today for the following:   Admitted to [facility] with anemia after here last night also found to have dvts on eliquis now for 6 mos. Also keeping filter in for now. Has dvts in left leg. Also going for left breast biopsy on Friday for possible breast ca. Already treated for right breast bx.  Dr cody is her hematologist        Current Outpatient Prescriptions   Medication Sig Dispense Refill    albuterol (PROVENTIL;VENTOLIN;PROAIR) 90 mcg/actuation inhaler Inhale 1-2 puffs into the lungs every 6 hours as needed. 1 Inhaler 3    apixaban (ELIQUIS) 5 mg Tab Take by mouth.      blood sugar diagnostic (ACCU-CHEK AVIVA PLUS TEST STRP) Strp fsbs qd 100 strip 1    blood-glucose meter (GLUCOSE MONITORING KIT) kit by Other route 2 (two) times daily. Use as instructed - fsbs bid       CALCIUM CITRATE ORAL Take 1 tablet by mouth daily.        cholecalciferol, vitamin D3, 2,000 unit Cap Take 1 capsule by mouth daily. 1 capsule 0    conjugated estrogens (PREMARIN) vaginal cream twicwe a week 42.5 g 0    cranberry 400 mg Cap Take by mouth.      cyclobenzaprine (FLEXERIL) 10 MG tablet Take 1 tablet (10 mg total) by mouth 3 times a day as needed for Muscle spasms. 10 tablet 0    DULoxetine (CYMBALTA) 60 MG capsule Take 1 capsule (60 mg total) by mouth daily. 30 capsule 5    ferrous sulfate 140 mg (45 mg iron) TbSR Take 1 tablet by mouth daily. 90 tablet 1    fluticasone (FLONASE) 50 mcg/actuation nasal spray Use 2 sprays into each nostril daily. 16 g 5    folic acid-vit B6-vit B12 2.5-25-1 mg Tab Take 1 tablet by mouth daily. 90 each 1     gabapentin (NEURONTIN) 600 MG tablet Take 1 tablet (600 mg total) by mouth 3 times a day. 270 tablet 5    ipratropium (ATROVENT) 0.03 % nasal spray Use 2 sprays into each nostril 2 times a day. 30 mL 5    LACTOBACILLUS ACIDOPHILUS (PROBIOTIC ORAL) Take 1 capsule by mouth daily.      levothyroxine (SYNTHROID, LEVOTHROID) 50 MCG tablet Take 1 tablet (50 mcg total) by mouth every morning before breakfast. 90 tablet 1    valACYclovir (VALTREX) 500 MG tablet TAKE 1 TABLET BY MOUTH EVERY DAY 30 tablet 0     No current facility-administered medications for this visit.       Review of Systems   Constitutional: Negative for fatigue.   All other systems reviewed and are negative.           Objective:     Vitals:    09/22/16 0754   BP: 114/84   Pulse: 80   Resp: 12   Weight: 191 lb (86.6 kg)   Height: 5' 8 (1.727 m)     Body  mass index is 29.04 kg/m.    Physical Exam   Constitutional: She appears well-developed and well-nourished.   HENT:   Head: Normocephalic and atraumatic.   Cardiovascular: Normal rate, regular rhythm and normal heart sounds.  Exam reveals no gallop and no friction rub.    No murmur heard.  Pulmonary/Chest: Effort normal and breath sounds normal.   Abdominal: Soft. Bowel sounds are normal.   Musculoskeletal: Normal range of motion.   Psychiatric: She has a normal mood and affect. Her behavior is normal.            Assessment/Plan:       Acquired hypothyroidism  Instructed pt to continue current meds      Anxiety and depression  Instructed pt to continue current meds      H/O bariatric surgery  Cont supplements    DVT (deep venous thrombosis) (CMS Dx)  Dr cody managing anticoag, should reach out to him re holding eliquis for breast bx    Breast cancer (CMS Dx)  Getting bx left breast for abnormal mammo                    Pharrell Ledford Johnn Hai, MD

## 2016-09-22 NOTE — Assessment & Plan Note (Signed)
Cont supplements

## 2016-09-22 NOTE — Unmapped (Signed)
Getting bx left breast for abnormal mammo

## 2016-09-22 NOTE — Unmapped (Signed)
Instructed pt to continue current meds

## 2016-09-22 NOTE — Assessment & Plan Note (Signed)
Dr cody managing anticoag, should reach out to him re holding eliquis for breast bx

## 2016-11-02 NOTE — Telephone Encounter (Signed)
Spoke to the patient and confirmed appt for 10/3.

## 2016-11-04 ENCOUNTER — Ambulatory Visit: Admit: 2016-11-04 | Discharge: 2016-11-04 | Payer: PRIVATE HEALTH INSURANCE | Attending: Family

## 2016-11-04 DIAGNOSIS — R0681 Apnea, not elsewhere classified: Secondary | ICD-10-CM

## 2016-11-04 NOTE — Unmapped (Signed)
Chief Complaint: anesthesiologist told her she has sleep apnea and needs to be evaluated    Referral made by: Dr. Rico Junker, [place] .     HPI: Wanda Rowe is a 56 y.o. female who has observed apnea, loud snoring, and snorting. She complains of excessive daytime sleepiness and naps for about 1-2 hrs.  She has no problems falling asleep but is a restless sleeper and wakes up about 1-2 x per night. She wakes up un-refreshed.     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 Falls asleep within few minutes, with 1-2 nighttime awakenings due too check phone and let the dog put. Falls back asleep in few minutes. . Awakens at 7 am during the week and 8-9 am on the weekends. Wakes up un-refreshed    bed partner pets  Patient thinks they get approximately 8 hours of sleep a night.    Nap 1 x per week for 1-2 hrs   Patient's desired sleep schedule would be 11p-9a  Pt lives with dogs  Occupation attorney  Divorced 1 child    Sleep Habits    No drink caffeine 6 hours before bedtime   No exercise before bedtime    Yes Watch TV before falling asleep,    Yes use electronic devices before falling asleep or during the night    Unusual sleep activity:   Yes does  remember her dreams. Yes  vivid dreams. Yes chase type dreams.Yes  nightmares. No dream enactment.   No cataplexy,   No sleep paralysis   No hypnagogic or hypnopompic hallucinations   No enuresis    Yes sleep talking    No sleep walking    No sleep eating   Yes bruxism uses a mouth gard  Yes TMJ uses a mouth guard no adjusting   No RLS   Yes kicking while  asleep.      No closed head injuries   No Seizures    No clastrophobia  Tonsils have been removed No  Deviated septum  No  Problems with nasal congestion Yes  palpitations yes  Night sweats no  Decreased sex drive no  Dentures no good condition'  Anxiety- yes    Previous evaluation and treatment has included none    EPWORTH SLEEPINESS SCALE: (at or above 10 is abnormal and 24 is the maximum score)  No flowsheet data found.    .Previous Report(s) Reviewed: historical medical records, lab reports and office notes   The following portions of the patient's history were reviewed and updated as appropriate: allergies, current medications, past family history, past medical history, past social history, past surgical history and problem list.    SOCIAL HX:  Social History     Social History   ??? Marital status: Single     Spouse name: N/A   ??? Number of children: N/A   ??? 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  Smoking status: Never Smoker   ??? Smokeless tobacco: Never Used      Comment: (06/23/2006)   ??? Alcohol use Yes      Comment: Occasionally (06/23/2006)   ??? Drug use: Unknown   ??? Sexual activity: Not Asked     Other Topics Concern   ??? Caffeine Use No   ??? Occupational Exposure No   ??? Exercise No   ??? Seat Belt Yes     Social History Narrative   ??? None        Past Medical Hx:   Past Medical History:   Diagnosis Date   ??? Anemia    ??? Anxiety    ??? Asthma     Mild   ??? Breast cancer (CMS Dx)     chemo and radiation   ??? Cancer (CMS Dx)     breast   ??? Diabetes mellitus (CMS Dx)     Type 2   resolved with gastric sleeve   ??? DVT (deep vein thrombosis) in pregnancy (CMS Dx)    ??? GERD (gastroesophageal reflux disease)    ??? Hyperlipidemia    ??? Hypertension    ??? Hypothyroidism    ??? Mitral valve prolapse    ??? Polyp of stomach    ??? RSD (reflex sympathetic dystrophy)     R knee     SurgHx:   Past Surgical History:   Procedure Laterality Date   ??? ANTERIOR CRUCIATE LIGAMENT REPAIR       L    ??? birth mark      benign on buttocks deep   ??? BREAST BIOPSY Left    ??? BREAST SURGERY Right     Lumpectomy chemo and radiation   ??? COLONOSCOPY W/ POLYPECTOMY      benign   ??? COLPOSCOPY     ??? DENTAL SURGERY      Periodontal    ??? ESOPHAGOGASTRODUODENOSCOPY N/A 07/19/2015    Procedure: ESOPHAGOGASTRODUODENOSCOPY WITH COLONOSCOPY WITH MAC;  Surgeon: Lurene Shadow, MD;  Location: [facility] ENDOSCOPY;  Service: Gastroenterology;  Laterality: N/A;   ??? FOOT SURGERY     ??? gastric sleeve     ??? HYSTERECTOMY     ??? IR IVC FILTER PLACE  05/22/2016   ??? JOINT REPLACEMENT Left     Knee   ??? MENISCECTOMY      L x 2, R x 1   ??? TYMPANOSTOMY TUBE PLACEMENT     ??? WISDOM TOOTH EXTRACTION       CURRENT MEDICATIONS:   Current Outpatient Prescriptions   Medication Sig   ??? albuterol Inhale 1-2 puffs into the lungs every 6 hours as needed.   ??? apixaban Take by mouth.   ??? blood sugar diagnostic fsbs qd   ??? blood-glucose meter by Other route 2 (two) times daily. Use as instructed - fsbs bid    ??? CALCIUM CITRATE ORAL Take 1 tablet by mouth daily.     ??? cholecalciferol (vitamin D3) Take 1 capsule by mouth daily.   ??? cyanocobalamin Take 1 tablet (500 mcg total) by mouth daily.   ??? DULoxetine Take 1 capsule (60 mg total) by mouth daily.   ??? fluticasone Use 2 sprays into each nostril daily.   ??? gabapentin Take 1 tablet (600 mg total) by mouth 3 times a day.   ??? ipratropium Use 2 sprays into each nostril 2 times a day.   ??? LACTOBACILLUS ACIDOPHILUS (PROBIOTIC ORAL) Take 1 capsule by mouth daily.   ??? levothyroxine  Take 1 tablet (50 mcg total) by mouth every morning before breakfast.   ??? UNABLE TO FIND Med Name: Jacinto Reap ferrasorb   ??? cranberry Take by mouth.   ??? cyclobenzaprine Take 1 tablet (10 mg total) by mouth 3 times a day as needed for Muscle spasms.   ??? valACYclovir TAKE 1 TABLET BY MOUTH EVERY DAY     No current facility-administered medications for this visit.       Outpatient Medications Prior to Visit   Medication Sig Dispense Refill   ???  albuterol (PROVENTIL;VENTOLIN;PROAIR) 90 mcg/actuation inhaler Inhale 1-2 puffs into the lungs every 6 hours as needed. 1 Inhaler 3   ??? apixaban (ELIQUIS) 5 mg Tab Take by mouth.     ??? blood sugar diagnostic (ACCU-CHEK AVIVA PLUS TEST STRP) Strp fsbs qd 100 strip 1   ??? blood-glucose meter (GLUCOSE MONITORING KIT) kit by Other route 2 (two) times daily. Use as instructed - fsbs bid      ??? CALCIUM CITRATE ORAL Take 1 tablet by mouth daily.       ??? cholecalciferol, vitamin D3, 2,000 unit Cap Take 1 capsule by mouth daily. 1 capsule 0   ??? cyanocobalamin (B-12 DOTS) 500 MCG tablet Take 1 tablet (500 mcg total) by mouth daily. 30 tablet 0   ??? DULoxetine (CYMBALTA) 60 MG capsule Take 1 capsule (60 mg total) by mouth daily. 30 capsule 5   ??? fluticasone (FLONASE) 50 mcg/actuation nasal spray Use 2 sprays into each nostril daily. 16 g 5   ??? gabapentin (NEURONTIN) 600 MG tablet Take 1 tablet (600 mg total) by mouth 3 times a day. 270 tablet 5   ??? ipratropium (ATROVENT) 0.03 % nasal spray Use 2 sprays into each nostril 2 times a day. 30 mL 5   ??? LACTOBACILLUS ACIDOPHILUS (PROBIOTIC ORAL) Take 1 capsule by mouth daily.     ??? levothyroxine (SYNTHROID, LEVOTHROID) 50 MCG tablet Take 1 tablet (50 mcg total) by mouth every morning before breakfast. 90 tablet 1   ??? UNABLE TO FIND Med Name: thorne ferrasorb 1 capsule 0   ??? cranberry 400 mg Cap Take by mouth.     ??? cyclobenzaprine (FLEXERIL) 10 MG tablet Take 1 tablet (10 mg total) by mouth 3 times a day as needed for Muscle spasms. 10 tablet 0   ??? valACYclovir (VALTREX) 500 MG tablet TAKE 1 TABLET BY MOUTH EVERY DAY 30 tablet 0     No facility-administered medications prior to visit.      Allergies as of 11/04/2016 - Fully Reviewed 11/04/2016   Allergen Reaction Noted   ??? Succinylcholine Hives 01/24/2013   ??? Zithromax [azithromycin]     ??? Silver Rash 01/24/2013   ??? Sulfa (sulfonamide antibiotics) Swelling and Rash    ??? Sulfanilamide Rash 01/24/2013     Family Hx:   Family History    Problem Relation Age of Onset   ??? Hypertension Mother    ??? Other Mother         DDD   ??? Diabetes Father         Type 2   ??? Hypertension Father    ??? Factor VIII deficiency Father    ??? Other Brother         Back Pain   ??? Diabetes Maternal Grandmother         Type 2   ??? Cancer Maternal Grandmother         Bladder CA   ??? Stroke Maternal Grandmother    ???  Colon Cancer Maternal Grandfather    ??? Breast Cancer Paternal Grandmother        Review of Systems  General ROS: positive for  - fatigue and weight gain  Ophthalmic ROS: positive for - uses glasses  Ears, nose, mouth, throat, and face ROS: positive for nasal congestion snoring  Respiratory ROS: all negative  Cardiovascular ROS: positive for irregular heart beat and palpitations  Gastrointestinal ROS: negative  Genitourinary NWG:[id number] for nocturia  Integument/breast ROS: negative  Hematologic/lymphatic ROS: negative  Musculoskeletal MVH:QIONGEXB for arthralgias, neck pain and stiff joints  Neurological ROS: negative  Psychological ROS: positive for - anxiety  Endocrine ROS: all  negative  Allergy and Immunology ROS: positive for - see list      Denies: suicidal ideas, irritability, depression, racing mind, changes in appetite, fainting spells, alcoholism, tremors, using illegal drugs, worrying about inability to sleep, insomnia, chest pain and shortness of breath,  lower extremity edema,    I have reviewed the review of system and advised patient to contact primary care physician or specialist for non sleep related issues.    ROS  has been reviewed and updated with the patient during this visit.     Objective:     Wt Readings from Last 3 Encounters:   11/04/16 191 lb (86.6 kg)   09/22/16 191 lb (86.6 kg)   08/11/16 193 lb 9.6 oz (87.8 kg)     BP 120/70    Pulse 72    Ht 5' 8 (1.727 m)    Wt 191 lb (86.6 kg)    SpO2 98%    BMI 29.04 kg/m??  personally reviewed    Physical Exam   Constitutional:  alert, and in no apparent distress. Well-developed and well-nourished.    Head: normocephalic and atraumatic.   Ears: appear normal externally bilaterally  Eyes: Conjunctivae are normal. Pupils are equal, round, no scleral icterus, drainage or redness.   Nose: Columella normal.  Negative for nasal congestion. No deviated septum.   Mouth : Mallampati 3. Crowded airway. Oropharynx is clear, moist and pink. Uvula thick and elongated,  midline swollen and reddened. Tonsils unable to visualize. Tongue normal. Dentition good.  Neck: Supple, Grossly normal range of motion. No thyromegaly. No adenopathy. No tenderness. Trachea midline.    Cardiovascular: normal rate and regular rhythm. Neg murmur.    Pulmonary/Chest: effort normal and breath sounds normal. No stridor. No respiratory distress, no wheezes, no rales.   Abdominal: deferred   Musculoskeletal: Grossly normal range of motion and strength. Normal gait.  No edema and no tenderness.   Neurological:  alert. Cranial nerves II-XII grossly intact. Strength 5/5 in upper  extremities bilaterally.  No tremor.   Skin: skin is warm and dry. No rash noted.   Psychiatric: normal speech, normal mood and affect, behavior is normal. Judgment and thought content normal.     Labs Reviewed:   Lab Results   Component Value Date    IRON 73 09/18/2016    TIBC 365 04/28/2008    FERRITIN 87 09/18/2016     Lab Results   Component Value Date    WBC 7.7 09/18/2016    HGB 12.5 09/18/2016    HCT 36.6 09/18/2016    MCV 85 09/18/2016    PLT 223 09/18/2016     Lab Results   Component Value Date    GLUCOSE 105 (H) 09/18/2016    BUN 12 09/18/2016    CO2 22 09/18/2016    CREATININE 0.65 09/18/2016  K 4.0 09/18/2016    NA 141 09/18/2016    CL 102 09/18/2016    CALCIUM 9.9 09/18/2016     Lab Results   Component Value Date    HGBA1C 4.9 09/18/2016     Lab Results   Component Value Date    TSH 2.140 09/18/2016    T3TOTAL 121 09/18/2016    T3FREE 2.5 01/18/2014    FREET4 0.97 07/01/2016    THYROIDAB 1 11/10/2013         Assessment     Assessment:     Observed apnea, loud  snoring, and crowded airway Mallampati 3 suggestive of sleep apnea.     Hypersomnia- complains of excessive sleepiness and is napping.  This  is most likely multifactorial, due to inadequate sleep hygiene, psychophysiologic conditioning, sequelae from underlying medical conditions, and possible OSA.     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 This most likely multifactorial, due to inadequate sleep hygiene, psychophysiologic conditioning, sequelae from underlying medical conditions, and possible OSA.    Body mass index is 29.04 kg/m??.     Breast cancer s/p breast surgery chemo and radiation, Anxiety, nerve disorder, mitral valve prolaps, gastric sleeve, GERD, IBS, anemia, hx DVT has filter (blood clots)   Plan:   1. Possible OSA:   The patient was educated re: OSAHS pathophysiology, relationship with cardiovascular, cerebrovascular and metabolic morbidity and mortality.  Discussed both NPSG and HST     ordered HST. The patient was instructed in the procedure.     Advised that depending on what study was approved by the insurance patient may have an in lab polysomnogram or an unattended sleep study at home.      2. Patient was educated regarding avoiding driving while sleepy, specifically avoiding long distance or night driving pending successful treatment of this condition. Guidelines for tips to avoid drowsy driving were provided. Pt aware this is their responsibility not to drive drowsy and find another form of transportation.     3. Proper sleep hygiene discussed, sleep hygiene instructions provided.  * Set a bed and awake time and try to do the same times each day  * No naps or if has too minimizing naps to < 1 hour during the daytime  *  Avoiding drinking fluids too late into the day  * Stop caffeine by 2pm,  avoid alcohol before bed, and don't go to be hungry or full.  * Using the bed only for sleep and sex and retraining ourselves to avoid other activities in the bed (no reading, worrying, watching TV, eating, etc)     4. Encouraged weight loss and exercise    Follow up appointment recommended for results.            AMB SLEEP QUALITY MEASURES    Medical Decision Making:   The following items were considered in medical decision making:   Review / order other diagnostic tests/interventions   Risks & Benefits:   Risks, benefits and treatment options discussed with patient.

## 2016-11-04 NOTE — Unmapped (Signed)
HOME SLEEP STUDY  Once we obtain an approval from your insurance our office will contact you to schedule your test.     You will pick up the device and be shown how to use it.  You will need to return the device the next day, and may need to take it back for a second night depending on the insurance.        ??? WHAT TO DO BEFORE YOUR SLEEP TEST   ??? DO NOT take any naps on the day of the study.   ??? DO NOT have any chocolate or caffeine after 2 pm.   ??? Please inform the technician if you have any allergies or sensitivities, due to the leads (stickers).    ??? If you have a terrible cold or received some bad news on the day of the study, please contact our office to reschedule. Please follow all protocols regarding cancellation and no show policies. If you have any questions or concerns please call our office for further details, call [phone].     AFTER YOUR SLEEP STUDY  ?? All patients are advised to ensure they have scheduled a follow up appointment to discuss results from the sleep study   ??   ?? If you do not already have an appointment scheduled please call our office at [phone] to schedule one as soon as your sleep study is completed. Thank you.   ??   ?? If you have a Positive airway pressure device or PAP device, please bring  your equipment  and all supplies to EVERY office visit (including your machine, tubing, mask and power cord). Do not travel with water in the water chamber. We do a thorough check on your device at every visit.       Sleep Apnea   Sleep apnea is a sleep disorder characterized by abnormal pauses in breathing while you sleep. When your breathing pauses, the level of oxygen in your blood decreases. This causes you to move out of deep sleep and into light sleep. As a result, your quality of sleep is poor, and the system that carries your blood throughout your body (cardiovascular system) experiences stress. If sleep apnea remains untreated, the following conditions can develop:  ?? High  blood pressure (hypertension).  ?? Coronary artery disease.  ?? Inability to achieve or maintain an erection (impotence).  ?? Impairment of your thought process (cognitive dysfunction).  There are three types of sleep apnea:  1. Obstructive sleep apnea--Pauses in breathing during sleep because of a blocked airway.  2. Central sleep apnea--Pauses in breathing during sleep because the area of the brain that controls your breathing does not send the correct signals to the muscles that control breathing.  3. Mixed sleep apnea--A combination of both obstructive and central sleep apnea.  RISK FACTORS  The following risk factors can increase your risk of developing sleep apnea:  ?? Being overweight.  ?? Smoking.  ?? Having narrow passages in your nose and throat.  ?? Being of older age.  ?? Being female.  ?? Alcohol use.  ?? Sedative and tranquilizer use.  ?? Ethnicity. Among individuals younger than 35 years, African Americans are at increased risk of sleep apnea.  SYMPTOMS   ?? Difficulty staying asleep.  ?? Daytime sleepiness and fatigue.  ?? Loss of energy.  ?? Irritability.  ?? Loud, heavy snoring.  ?? Morning headaches.  ?? Trouble concentrating.  ?? Forgetfulness.  ?? Decreased interest in sex.  DIAGNOSIS   In   order to diagnose sleep apnea, your caregiver will perform a physical examination. Your caregiver may suggest that you take a home sleep test. Your caregiver may also recommend that you spend the night in a sleep lab. In the sleep lab, several monitors record information about your heart, lungs, and brain while you sleep. Your leg and arm movements and blood oxygen level are also recorded.  TREATMENT  The following actions may help to resolve mild sleep apnea:  ?? Sleeping on your side. ??  ?? Using a decongestant if you have nasal congestion. ??  ?? Avoiding the use of depressants, including alcohol, sedatives, and narcotics. ??  ?? Losing weight and modifying your diet if you are overweight.  There also are devices and treatments to  help open your airway:  ?? Oral appliances. These are custom-made mouthpieces that shift your lower jaw forward and slightly open your bite. This opens your airway.  ?? Devices that create positive airway pressure. This positive pressure splints your airway open to help you breathe better during sleep. The following devices create positive airway pressure:  ?? Continuous positive airway pressure (CPAP) device. The CPAP device creates a continuous level of air pressure with an air pump. The air is delivered to your airway through a mask while you sleep. This continuous pressure keeps your airway open.  ?? Nasal expiratory positive airway pressure (EPAP) device. The EPAP device creates positive air pressure as you exhale. The device consists of single-use valves, which are inserted into each nostril and held in place by adhesive. The valves create very little resistance when you inhale but create much more resistance when you exhale. That increased resistance creates the positive airway pressure. This positive pressure while you exhale keeps your airway open, making it easier to breath when you inhale again.  ?? Bilevel positive airway pressure (BPAP) device. The BPAP device is used mainly in patients with central sleep apnea. This device is similar to the CPAP device because it also uses an air pump to deliver continuous air pressure through a mask. However, with the BPAP machine, the pressure is set at two different levels. The pressure when you exhale is lower than the pressure when you inhale.  ?? Surgery. Typically, surgery is only done if you cannot comply with less invasive treatments or if the less invasive treatments do not improve your condition. Surgery involves removing excess tissue in your airway to create a wider passage way.  Document Released: 01/09/2002 Document Revised: 05/16/2012 Document Reviewed: 05/28/2011  ExitCare?? Patient Information ??2015 [employer]. This information is not intended to  replace advice given to you by your health care provider. Make sure you discuss any questions you have with your health care provider.    Sleep Hygiene     Sleep disruption is common, especially during times when you may feel emotionally overwhelmed. Anxieties, relentless replay of the day???s events and heightened emotions may significantly interfere with your sleep. Lack of sleep robs you of needed rest, making management of your illness more difficult.   Bring sleep patterns under control and working at a consistent, stable pattern is very important to illness management. You need your rest. The most common cause of insomnia is a change in your daily routine. For example traveling, change in work hours, disruption of other behaviors (eating, exercise, leisure, etc.), relationship conflicts may cause sleep problems. Paying attention to good sleep hygiene is the most important thing you can do to maintain good sleep.       Do:   1. Go to bed at the same time each day.   2. Get up from bed at the same time each day.   3. Get regular exercise each day, preferably in the morning. There is good evidence that regular exercise improves restful sleep. This includes stretching and aerobic exercise.   4. Get regular exposure to outdoor or bright lights, especially in the late afternoon.   5. Keep the temperature in your bedroom comfortable.   6. Keep the bedroom quiet when sleeping   7. Keep the bedroom dark enough to facilitate sleep.   8. Use your bed only for sleep and sex.   9. Take medications as directed. It???s often helpful to take prescribed sleeping pills one hour before bedtime, so they are causing drowsiness when you lie down, or 10 hours before getting up to avoid daytime drowsiness.   10. Use a relaxation exercise just before going to sleep, (muscle relaxation, imagery, massage, breathing exercises, warm baths).   11. Keep your feet and hands warm. Wear warm socks and/or mittens or gloves to bed.     Don???t:   1.  Exercise just before going to bed.   2. Engage in stimulating activity just before bed, such as playing a competitive game, watching an exciting program on TV or movie, or having an important discussion with a loved one.   3. Have caffeine in the evening (coffee, many teas, chocolate, sodas, etc). No caffeine after noon.    4. Read or watch TV in bed.   5. Use alcohol to help you sleep.   6. Go to bed too hungry or too full.   7. Take another person???s sleeping pills.   8. Take over-the-counter sleeping pills without your doctor???s knowledge. Tolerance can develop rapidly with these medications. Diphenhydramine (an ingredient commonly found in over-the-counter sleep medications) can have serious side effects for elderly patients.   9. Take daytime naps.   10. Command yourself to go to sleep. This only makes your mind and body more alert.   11. Do not look at the time when you wake up in the middle of the night. Remove the clocks from the bedroom or set your alarm for the morning and have it face the wall.   If you lie in bed awake for more than 20-30 minutes, get up, go to a different room (or different part of the bedroom), and participate in a quiet activity (non-excitable reading, knitting or listen to soothing music) then return to bed when you feel sleepy. Do this as many times during the night as needed.    Excessive daytime sleepiness/drowsiness    Excessive sleepiness is a situation where your brain is suffering from a lack of sleep or from a medication with sedative side effects. When you are sleepy you are at risk if you are operating a motor vehicle, other equipment or during critical decision-making processes.    Sleepiness is natural when you are at the end of the day near the time of your usual bedtime.  Sleepiness can occur at other times as well.  Inappropriate sleepiness can be caused by sleep disorders that degrade the quality of your sleep, by bad habits where you purposely or inadvertently short  yourself with regard to adequate amounts of sleep, bad habits such as varying the time you go to bed and get up or, through no-fault of your own, when you are in a stressful situation and you have difficulty initiating and maintaining sleep   at your usual time.    Many medications can have sleepiness or sedation as a side effect.  Common such medications include narcotic pain relievers (such as codeine, Percocet, Vicodin, morphine etc.), muscle relaxants (such as Flexeril or Soma), tranquilizers taken for anxiety (Ativan, Xanax, Valium etc.),  other tranquilizers ( such as Geodon, Risperdal, Haldol etc.) amongst many other medications.  It should be fairly obvious that these medicines could cause you to be excessively sleepy.  There are other medications that can produce sedation which are less obvious such as prescription and over-the-counter antihistamines, Chantix, Requip, Mirapex, Sinemet, older antidepressants (e.g. Elavil (amitriptyline), medications that are used to treat seizures and many others.    The nature of sleepiness is that the brain does not react as quickly as it would otherwise do when we are more rested.  Your reaction time will be slower.  You may find it harder to do a simple task and complex task might even allude you.  You can be irritable and have trouble interacting with other people.  In addition to the inattentiveness, memory issues and irritability you are at risk of inadvertently falling asleep.    If you follow asleep when you???re operating a motor vehicle the potential for a disaster is obvious.  If you are lucky, the rumble strip will wake you up.  Otherwise it may be the bridge abutment or the oncoming traffic.  When you fall asleep while driving you cannot will yourself back from sleep.  Your brain will wake up when it is refreshed (which will not happen if you???re behind the wheel of a car or operating other equipment) or when you???re awakened by whatever you hit.  When you get the  warning signs of sleepiness such as head bobbing, eyelids drooping, relaxing back into the car seat you need to recognize these warning signs and stop driving or figure out a way to activate your brain to allow continued driving to be safe.  If you don???t get warning signals that you???re sleepy and you???ve had problems properly controlling your vehicle, have had near misses or accidents due to sleepiness then you should stop driving and not resume until the problem is adequately addressed.  It is you???re responsibility to curtail or eliminate you???re driving when you cannot stay awake.    If you???re an equipment operator, sleepiness can put you at risk for on-the-job injuries or death.  If you are too sleepy to operate the equipment it is your responsibility and duty to stop.    In addition to risk of injury or death, sleepiness in the workplace can lead to reduced productivity, problems with interpersonal relations due to irritability, and errors.  Depending on your job impairment due to sleepiness could have varying degrees of impact both to you, to the people around you, and perhaps to others.     If you???re under treatment for a qualitative problem with your sleep and you are still sleepy to the point you cannot safely drive, operate equipment necessary for your job or if you???re sleepiness is impairing your ability to make appropriate decisions then you need to make an appointment with your sleep provider to address this problem as soon as possible.  In the meantime, you need to undertake whatever measures are necessary to ensure your safety and the safety of others around you.      DROWSY DRIVING TIPS    These suggestions will help prevent you from the risk of drowsy driving.       1. If you feel tired or drowsy don't drive. Sleepiness is a major cause of motor vehicle accidents and accounts for 40% of all fatal crashes reported on the [place] thru[place]. No matter how much you think you can control sleepiness, you can't.      2. Ensure you follow your doctor's advice about the treatment for your sleep disorder. For example, if you have sleep apnea and use CPAP, ensure you use it fully the night before your trip.     3. Get a good night's sleep before driving. Do not cut yourself short of sleep if you plan a long drive the next day. Get to bed early and do not stay up late packing.     4.Avoid alcohol both the night before your trip and during your trip. Alcohol will disrupt sleep and make you more tired the next day. Sleepiness and alcohol are additive in increasing impairment of your driving ability.     5. Avoid any sedative medications, including sedative antihistamines that are often contained in cold or allergy medications, the night before you drive as they may have long lasting effects the next day.     6. Travel during non-sleeping hours. Accidents due to sleepiness are more common during the nighttime hours.     7. If sleepy, stop and rest. Drink coffee and walk around. Take a brief nap, lock your car doors and take a nap in your car if you are sleepy. Have a 10-15 minute break after every 2 hours of driving.     8. Drive with a companion. Share the driving. Relax in the back seat until it is your time to share the driving again.     These are only guidelines; please discuss your diagnosis and treatment with your sleep medicine provider. Drive safely.

## 2016-11-25 ENCOUNTER — Ambulatory Visit: Admit: 2016-11-25 | Payer: PRIVATE HEALTH INSURANCE | Attending: Critical Care Medicine

## 2016-11-25 DIAGNOSIS — G4730 Sleep apnea, unspecified: Secondary | ICD-10-CM

## 2016-11-25 NOTE — Unmapped (Signed)
Night 1 Sleep Study for HSAT on 11/25/2016 was scored, then assigned and an email was sent to Dr. Zeller. Note was made and provider was changed in Epic. Sleep Study documented on spreadsheet. Pt seen by Julie Knapp, CNP.

## 2016-11-25 NOTE — Unmapped (Signed)
Patient came to the sleep medicine center for an education session on how to use HSAT Night 1 ([id number]). The patient was educated on how to connect all of the sensors. The patient states understanding how to attach and detach the sensors. The patient is scheduled to use HSAT for 1 night. The patient was instructed to use the HSAT for >/= 6 hours. The HSAT will be returned to the sleep medicine center on the next business day, after monitoring has been completed.

## 2016-12-01 NOTE — Unmapped (Signed)
Patient needs a results appointment from study done 10/24. Follow up is 1/3

## 2016-12-03 MED ORDER — levothyroxine (SYNTHROID, LEVOTHROID) 75 MCG tablet
75 | ORAL_TABLET | ORAL | 0 refills | Status: AC
Start: 2016-12-03 — End: 2017-01-06

## 2016-12-03 NOTE — Unmapped (Signed)
Called patient to schedule for open appointments, however she was  Not able to take them. She was not happy that we do not have any openings at this time and wants to know if she needs to get a machine. If she needs a machine she will need to get this year because next year the insurance changes. The patient also asked if her primary doctor could get the results and order the machine. I told the patient I would forward the message to the MA and have them call her.

## 2016-12-04 NOTE — Unmapped (Signed)
She has moderate sleep apnea, office policy she needs come come in for results so we can discuss treatment. She was offered 2 appointments and she turned those down per office staff

## 2016-12-04 NOTE — Unmapped (Signed)
Patient called back, she wants her sleep study results sent to her PCP Sinahi Hovermale, and to her ENT Dr. Luanna Cole ph [phone]. She is worried she will not be able to get her results and set up with a pap machine before the new year and her insurance will change then.  I did advise the ordering physician is responsible for giving results but she insisted on having them sent out so that she can go somewhere else.  I advised she will need to sign a medical records release form.

## 2016-12-04 NOTE — Unmapped (Signed)
LMOVM to advise on cancel list for sooner appt, gave my direct # for call back.

## 2016-12-05 NOTE — Unmapped (Signed)
ok 

## 2016-12-07 NOTE — Telephone Encounter (Signed)
Spoke with patient to advise need to be seen for results, I will put her on cancel list for next available appointment.  She agreed to that.

## 2016-12-09 NOTE — Unmapped (Addendum)
Progress Notes by Ophelia Shoulder., RMA at 12/09/16 0815     Author:  Ophelia Shoulder., RMA Service:  Orthopedics Author Type:  Medical Assistant    Filed:  12/11/16 1135 Encounter Date:  12/09/2016 Status:  Signed    Editor:  Fawn Kirk., MD (Physician)  Cosigner:  Fawn Kirk., MD at 12/11/16 1321       Subjective [JB.1]   Subjective:   Patient ID:[JB.2] Wanda Rowe[JB.1]  Age:[JB.2] 56 y.o.[JB.1] (DOB:[JB.2] 1960-05-01[JB.1])[JB.2]  Ethnicity: Non-Hispanic  Race: White or Caucasian[JB.1]  Gender:[JB.2] female[JB.1]    Chief Complaint:[JB.2]  Chief Complaint     Patient presents with     ??????? 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  She had to have the knee manipulated on July 07, 2016.  She states that she is doing well now.  She gets occasional pain but nothing bad.  She is going to the gym   on a regular basis with no issues.      Ortho Exam: There is no warmth, swelling, erythema or effusion.  Range of motion is good and painless with no mechanical instability noted.  Ambulating well.  Alert and oriented, neurologically intact.      ROS:[JB.2]       Objective      Review of Systems  Musculoskeletal: Joint Pain, Knee Pain  Constitutional: (-) Fever, (-) Chills  Cardiovascular: Leg Swelling  GI: (-) Nausea, (-) Vomiting  HENT: (-) Headaches[JB.1]      Patient Vitals:[JB.2]  Vitals  Height: 5' 7.75 (172.1 cm)  Weight: 192 lb (87.1 kg)[JB.1]         X-Ray/Injection In Clinic Procedure Notes[JB.2]     X-Ray/Inj Notes     Author Status Last Editor Updated Created    Ophelia Shoulder., RMA Signed Ophelia Shoulder., RMA 12/10/2016  9:49 AM 12/10/2016  9:48 AM                 Assoc. Orders    DIAG-KNEE 3-VIEWS LT            Prosthesis is stable, showing good sizing, positioning and alignment, no signs of loosening or lucency.               In Clinic Administered Meds Billing Data     None[JB.1]         Assessment/Impression:[JB.2]     Encounter Diagnoses      Code  Name Primary?   ??????? 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 Xray Clinic - Knee 3 views AP, lateral, sunrise Left  [[id number]][JB.1]     I reviewed her x-rays and examined her knee advising that everything looks good.  I instructed her to continue with regular exercise and strengthening.  I informed her that she will continue to improve up to the 1 year mark.  I will see her back at her   one-year anniversary of surgery.  All questions answered and understood.      I, [name], RMA am scribing for, and in the presence of Dr. George Ina, including xray interpretation if performed.[JB.2]    I, George Ina, MD personally performed  the services described in the documentation as scribed by [name], RMA in my presence, and confirm it is both accurate and complete.[EL.1]    Please note that documentation was completed using dragon speak and some errors may have occurred and have not been identified.  Please check the chart for addendums.  If there are major errors that you have concern about please contact the office for   clarification.[JB.2]               Electronically signed by Fawn Kirk., MD on 12/11/16 1321.   Attribution Key    EL.1 - Fawn Kirk., MD on 12/11/16 1135  JB.1 - Ophelia Shoulder., RMA on 12/10/16 [phone]  JB.2 - Ophelia Shoulder., RMA on 12/10/16 9604          Revision History      Date/Time User Provider Type Action   > 12/11/16 1135 Fawn Kirk., MD Physician Sign    12/10/16 0955 Ophelia Shoulder., RMA Medical Assistant Sign at close encounter

## 2016-12-09 NOTE — Telephone Encounter (Signed)
Scheduled

## 2016-12-10 ENCOUNTER — Ambulatory Visit: Admit: 2016-12-10 | Discharge: 2016-12-10 | Payer: PRIVATE HEALTH INSURANCE | Attending: Family

## 2016-12-10 DIAGNOSIS — G4733 Obstructive sleep apnea (adult) (pediatric): Secondary | ICD-10-CM

## 2016-12-10 NOTE — Telephone Encounter (Signed)
Email NEW RX Setup RX, sleep study, advised office notes in epic.
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
## 2016-12-10 NOTE — Unmapped (Signed)
Sleep Apnea   Sleep apnea is a sleep disorder characterized by abnormal pauses in breathing while you sleep. When your breathing pauses, the level of oxygen in your blood decreases. This causes you to move out of deep sleep and into light sleep. As a result, your quality of sleep is poor, and the system that carries your blood throughout your body (cardiovascular system) experiences stress. If sleep apnea remains untreated, the following conditions can develop:  ?? High blood pressure (hypertension).  ?? Coronary artery disease.  ?? Inability to achieve or maintain an erection (impotence).  ?? Impairment of your thought process (cognitive dysfunction).  There are three types of sleep apnea:  1. Obstructive sleep apnea Pauses in breathing during sleep because of a blocked airway.  2. Central sleep apnea Pauses in breathing during sleep because the area of the brain that controls your breathing does not send the correct signals to the muscles that control breathing.  3. Mixed sleep apnea A combination of both obstructive and central sleep apnea.  RISK FACTORS  The following risk factors can increase your risk of developing sleep apnea:  ?? Being overweight.  ?? Smoking.  ?? Having narrow passages in your nose and throat.  ?? Being of older age.  ?? Being female.  ?? Alcohol use.  ?? Sedative and tranquilizer use.  ?? Ethnicity. Among individuals younger than 35 years, African Americans are at increased risk of sleep apnea.  SYMPTOMS   ?? Difficulty staying asleep.  ?? Daytime sleepiness and fatigue.  ?? Loss of energy.  ?? Irritability.  ?? Loud, heavy snoring.  ?? Morning headaches.  ?? Trouble concentrating.  ?? Forgetfulness.  ?? Decreased interest in sex.  DIAGNOSIS   In order to diagnose sleep apnea, your caregiver will perform a physical examination. Your caregiver may suggest that you take a home sleep test. Your caregiver may also recommend that you spend the night in a sleep lab. In the sleep lab, several monitors record  information about your heart, lungs, and brain while you sleep. Your leg and arm movements and blood oxygen level are also recorded.  TREATMENT  The following actions may help to resolve mild sleep apnea:  ?? Sleeping on your side. ??  ?? Using a decongestant if you have nasal congestion. ??  ?? Avoiding the use of depressants, including alcohol, sedatives, and narcotics. ??  ?? Losing weight and modifying your diet if you are overweight.  There also are devices and treatments to help open your airway:  ?? Oral appliances. These are custom-made mouthpieces that shift your lower jaw forward and slightly open your bite. This opens your airway.  ?? Devices that create positive airway pressure. This positive pressure splints your airway open to help you breathe better during sleep. The following devices create positive airway pressure:  ?? Continuous positive airway pressure (CPAP) device. The CPAP device creates a continuous level of air pressure with an air pump. The air is delivered to your airway through a mask while you sleep. This continuous pressure keeps your airway open.  ?? Nasal expiratory positive airway pressure (EPAP) device. The EPAP device creates positive air pressure as you exhale. The device consists of single-use valves, which are inserted into each nostril and held in place by adhesive. The valves create very little resistance when you inhale but create much more resistance when you exhale. That increased resistance creates the positive airway pressure. This positive pressure while you exhale keeps your airway open, making it easier   to breath when you inhale again.  ?? Bilevel positive airway pressure (BPAP) device. The BPAP device is used mainly in patients with central sleep apnea. This device is similar to the CPAP device because it also uses an air pump to deliver continuous air pressure through a mask. However, with the BPAP machine, the pressure is set at two different levels. The pressure when you  exhale is lower than the pressure when you inhale.  ?? Surgery. Typically, surgery is only done if you cannot comply with less invasive treatments or if the less invasive treatments do not improve your condition. Surgery involves removing excess tissue in your airway to create a wider passage way.  Document Released: 01/09/2002 Document Revised: 07/21/2011 Document Reviewed: 05/28/2011  ExitCare?? Patient Information ??2014 [employer]      PREPARING TO USE POSITIVE AIRWAY PRESSURE DEVICE AT HOME    Setting up your equipment (To be used at all times when sleeping, including naps)  ?? Place your machine on a hard, level surface close to where you sleep. Plug your machine into a grounded outlet. If you need to use an extension cord, we suggest you use a surge protector.   ?? Connect your hose to the airflow outlet and connect the other end of the hose to your mask.   ?? Using distilled water only, fill the water chamber. Do NOT fill above the maximum line.   ?? Replace the distilled water daily, as bacteria may start to grow.   ?? If you have nasal dryness or a dry mouth, increase the humidity level one increment at a time.  ?? If your sleeping environment is cool, you can place the PAP hose under your covers to prevent condensation in the tubing.   ?? INSTRUCTIONS ON HOW TO PUT ON YOUR MASK: Place your mask on your face loosely. Most patients make the mistake of putting their masks on too tight.    o Turn on your machine. Lay back on your pillow to adjust your mask, as it will probably fit differently when you sit up vs. lying down. Remember, start very loose, and tighten as needed. A loose mask is much better than a tight mask. You will feel some air from the exhalation port on the mask. This is normal.   ?? Please note that if you are given a mask during your overnight sleep study or by your DME (medical supply company) you have 30 days to make any changes or exchanges. After the 30-day period, you may not be able to  return the mask or make changes for up to 3 months or longer depending on your insurance. If you have any questions please contact your DME (medical supply company).      We hope you do well adjusting to using your PAP device. PAP therapy is the gold standard treatment for Obstructive Sleep Apnea.     Few features we would like to highlight on your PAP device.       The first thing we want to point out is the RAMP feature. If you need temporary relief from the full therapy pressure of your machine, you can press the ramp button.  Respironics device: Press the elongated triangle button once.   F&P device: Press and hold the large dial for 3 seconds.    ResMed device: Ramp feature will automatically activate when you place the mask on your face (this is a built in feature). To reactivate it just pull the mask away from   you face and reposition it. Ramp feature will reactivate.     The Ramp feature temporarily allows you to reduce the pressure on your machine. Your machine can be set to ramp up within 5-45 minutes to your full therapy pressure. If you are not asleep by the time your machine reaches full pressure, and you need relief, you can re-start the ramping process by pressing the button again. You can use the ramp feature as many times and as often as you like. This is a useful feature if you use the restroom in the middle of the night.     The next thing we would like to point out is the HUMIDIFICATION feature on your PAP device. Most machines are pre-set for a humidity setting of 3 or 4. You can manually change the humidifier settings on your machine when you turn the machine on. Increase the humidity level one increment at a time.  Respironics device: Turn the on/off button to the left for less humidification or the right for more humidification. Humidification ranges from 1-5. If you have a heated hose talk to your provider or DME who can teach you how to make adjustments.     F&P device: Turn the large dial  to the right for higher level and left for lower level. Humidification ranges from 1-7.     ResMed S9 device: Attach the humidifier component to the main unit, turn   the dial and choose the water droplet icon; press the button once. Then turn the dial to adjust your humidity setting. Press button a final time to save the setting. Humidification ranges from 1-8        Resmed S10 Device - This device has an Auto humidification feature.    If you have a heated hose it defaults to Auto Setting. You can change it to           manual setting under My Options. Switch Climate Control from Auto to            Manual. Then you will see two other options pop up. Tube Temperature and Humidity Level. Tube temperature is like your thermostat how hot or cold you want the air to be. It can range from 60-86 degree (most defaults to 81 degrees). Humidity Level is the next option this is the Moisture that is captured from the distilled water placed in the chamber. It goes from 1-8. Higher the # more moister. SEE THIS VIDEO https://www.youtube.com/watch?v=[email or web address]&sns=em         For example, if you are waking up with a dry mouth, you should turn your humidifier up one number at a time. If you are getting excess condensation (water droplets) in your mask or tubing, you should turn the humidifier down one number at a time. In summer you need less humidity in winter you need more humidity. THIS NUMBER RARELY STAYS THE SAME ALL YEAR LONG. YOU HAVE TO CHANGE IT DEPENDING ON HOW YOU FEEL AND WHAT THE WEATHER IS OUTSIDE.     A prescription will be sent to a DME (medical supply company) covered by your insurance. The DME Company should contact you within 7-10 business days once they receive the prescription. If you have not received a call from the DME Company regarding your device, PLEASE CALL US so we may investigate further.     Once set up with your PAP equipment, please call our office and set up an appointment with your sleep  provider. At that visit,   we will obtain a download from your machine, address any issues with the mask or device, review cleaning instructions, replacement supply schedule and answer any questions you may have.     After receiving your PAP device please remember you need to bring all your equipment to EVERY office visit including your machine, tubing, mask and power cord. Make sure you do not travel with water in the humidifier (always empty it every morning). A thorough check of all your equipment will take place at each appointment.          CLEANING INSTRUCTIONS FOR PAP EQUIPMENT     Keeping your equipment and supplies clean is very important.     REMINDER: Only use DISTILLED WATER in your humidifier, Empty water daily.    DAILY  Mask and tubing:   ?? Wash your face before applying mask  ?? Wash mask and tubing with baby shampoo and warm water.     Humidifier:   ?? Empty water in reservoir  ?? Clean with baby shampoo and warm water  ?? Rinse, then air dry    WEEKLY  Mask and tubing:   ?? Soak your mask and tubing in 1 part vinegar and 3 parts water for 30 minutes. Rinse, and allow to air dry.     Humidifier:   ?? Wash with warm water and baby shampoo  ?? Soak in 1 part vinegar and 3 parts water for 30 minutes  ?? Rinse with warm water and allow to air dry    Machine Exterior:   ?? Wipe with a clean damp cloth    MONTHLY AND/OR AS NEEDED  ?? Reusable foam filters (black filter)- wash in warm water with baby shampoo. Rinse well and dry with paper towel  ?? Disposable felt filter (white filter)- Replace filter every two weeks to once a month    NOTE: If you are having repeated sinus and /or respiratory infections, dirty equipment may be the cause. It may help to clean and disinfect your equipment more frequently    TRAVELING  Always make sure the humidifier is empty when traveling. (including doctor appointments, air travel or long distance driving).  When flying, always carry your PAP device with you as a carry on item, NEVER  check it in as baggage. We can provide you with a letter stating it is a medical device, talk to your provider.    Always make sure you have your mask and tubing with you. You will need appropriate plug adapters when traveling outside the [place].    If travelling by air and you are unable to carry distilled water with youuse bottled water (no more than 2 weeks). DO NOT USE TAP WATER.     Equipment Replacement Schedule    To get the most benefit from your PAP therapy, your equipment should be replaced when necessary based on wear and tear. For example, your mask may need to be replaced if you notice it is cracked or the seal is leaking. If your tubing is torn, it needs to be replaced.   If you equipment is showing signs of wear, you may be entitled to replace it. The replacement schedule for Medicare patients is shown below. If you are NOT a Medicare patient, please check with your DME (Durable Medical Supply) provider for your individual insurance policy???s replacement schedule.     Supplies   Medicare Medicaid My Insurance Plan  Mask    1 per 3 m 1 per year _______________    Nasal cushion  2 per m 1 per 6 m _______________  Pillows cushion  2 per m 1per 6 m _______________  Full-face cushion  1 per m 1per 6 m _______________  Headgear   1 per 6 m 1 per year _______________  Water Chamber  1 per 6 m N/A  _______________  Chinstrap   1 per 6 m 1 per 6 m       _______________  Tubing/Hose   1 per 3 m 1 per year _______________  Heater wire tubing  1 per 3 m N/A  _______________  Filter, disposable (white) 2 per m 1 per m _______________  Filter, particle foam (black) 1 per 6 m 4 per year _______________  Therapy device  1 per 5 yrs  ?  _______________    Follow up and compliance goals for Medicare and Medicaid patients   (most private insurances are now following similar rules)  Medicare and Medicaid require patients to follow-up with their provider after they are set up with their PAP equipment. The follow up  appointment should be within 31-90 days after receiving their equipment. If patient does not follow up as directed there might be issues with the cost of the device being covered by the insurance. Therefore please ensure you make a follow up appointment with your sleep provider.      Insurance also requires that the patient meet some compliance goals such as the download should indicate patient has been wearing the device for at least 4 hours per night AND they are using the device for 30 nights in a row.     Key points to remember  ?? You should wear the device every time you sleep, no matter when you sleep or where you sleep. When you nap or travel or go on vacation you need to use the machine when asleep.   ?? If you sleep without the device your Obstructive Sleep Apnea (OSA) is untreated and your risk of heart attacks and strokes are higher.   ?? Clean the device on a regularly basis to avoid sinus infections and pneumonias   ?? Never travel with water in the reservoir   ?? Replace supplies on a regularly basis (see schedule below). You may need to contact your Durable Medical Equipment company (DME) and request supplies (just like you would medications from a pharmacy).  ?? Alcohol will worsen OSA, Alcohol can decrease your drive to breathe, slowing your breathing and making your breaths shallow. In addition, it may relax the muscles of your throat, which may make it more likely for your upper airway to collapse. REMEMBER TO ALWAYS WEAR YOUR PAP DEVICE WHEN SLEEPING.   ?? Certain medications can also worsen OSA such as some pain medications, anti-anxiety medications, muscle relaxants, testosterone supplementation. Please inform your sleep provider which medications you take on a regular basis.   ?? All patients are advised to make follow up appointments with the provider after they are set up with their PAP device. Most insurance plans now require the patient set up a follow up appointment with the provider 31-90 days  after they have been set up with their device and at least once a year thereafter. Discuss these issues with your provider for more details.   ?? Please note when you are provided with a mask you have a manufacturer 30 day guarantee that comes with it. If you do not like that mask for any reason contact your DME and request an exchange. After 30   days you may not be eligible for a new mask for another 3-6 months depending on insurance. Talk to your DME or sleep provider for more details.   ?? If you are struggling with nasal congestion try to use a nasal sinus rinse available over the counter such as Netti Pot 1 hour before bedtime (you may have to use it daily for a 7-10 days then as needed). You can also try over the counter Flonase or Nasacort 1-2 sprays in each nostril at bedtime.   ?? If you ever have problems with the device or the plan discussed with your sleep provider has not come to fruition please contact your provider by phone or via MyChart. Thank you

## 2016-12-11 ENCOUNTER — Ambulatory Visit: Admit: 2016-12-11 | Discharge: 2016-12-11 | Payer: PRIVATE HEALTH INSURANCE | Attending: Family

## 2016-12-11 DIAGNOSIS — B9689 Other specified bacterial agents as the cause of diseases classified elsewhere: Secondary | ICD-10-CM

## 2016-12-11 MED ORDER — cyclobenzaprine (FLEXERIL) 10 MG tablet
10 | ORAL_TABLET | Freq: Three times a day (TID) | ORAL | 0 refills | Status: AC | PRN
Start: 2016-12-11 — End: 2016-12-15

## 2016-12-11 MED ORDER — methylPREDNISolone (MEDROL, PAK,) 4 mg tablet
4 | PACK | ORAL | 0 refills | Status: AC
Start: 2016-12-11 — End: 2017-01-06

## 2016-12-11 MED ORDER — AMOXicillin-clavulanate (AUGMENTIN) 875-125 mg per tablet
875-125 | ORAL_TABLET | Freq: Two times a day (BID) | ORAL | 0 refills | Status: AC
Start: 2016-12-11 — End: 2017-01-06

## 2016-12-11 NOTE — Telephone Encounter (Signed)
Joni notified.

## 2016-12-11 NOTE — Unmapped (Signed)
Addended by: Jennette Bill on: 12/11/2016 03:17 PM     Modules accepted: Orders

## 2016-12-11 NOTE — Telephone Encounter (Signed)
Spoke with pt and she can not make the 12:40. She has a meeting at 1. Pt is going to keep her 2:40 appt

## 2016-12-11 NOTE — Unmapped (Signed)
[facility]  [address]  [place] [address]    Name:  Wanda Rowe Date of Birth: [birth year] (56 y.o.)   MRN: [id number]    Date of Service:  12/11/2016     Subjective:     Chief Complaint   Patient presents with   ??? Chest Congestion   ??? Otalgia   ??? Back Pain     started with stiff neck, but now upper and one side of lower back hurts bad too     History of Present Illness:  Wanda Rowe is a(n) 56 y.o. female here today for the following:   Otalgia    There is pain in both ears. This is a new problem. The current episode started in the past 7 days (3 days). The problem has been waxing and waning. Maximum temperature: low grade but states 99 is high for me Associated symptoms include coughing, neck pain, rhinorrhea and a sore throat. Pertinent negatives include no abdominal pain, diarrhea, ear discharge, headaches, hearing loss, rash or vomiting. Treatments tried: decongestant.   Back Pain   The pain is present in the thoracic spine. The quality of the pain is described as stabbing. Associated symptoms include a fever and pelvic pain (pressure). Pertinent negatives include no abdominal pain, bladder incontinence, bowel incontinence, chest pain, dysuria, headaches, leg pain, numbness, paresis, paresthesias, perianal numbness, tingling, weakness or weight loss. (Pelvic pressure)   Neck Pain    This is a new problem. The current episode started in the past 7 days (4 days). The problem occurs constantly. The problem has been rapidly worsening (had neck stiffness since monday but woke this morning with difficulty moving). Associated with: had stiff neck and then let dog pull her around on walk night. The pain is present in the occipital region. Associated symptoms include a fever. Pertinent negatives include no chest pain, headaches, leg pain, numbness, pain with swallowing, paresis, photophobia, syncope, tingling, trouble swallowing, visual change, weakness or weight  loss. Associated symptoms comments: Decreased ROM. She has tried heat and acetaminophen for the symptoms. The treatment provided no relief.       Current Outpatient Prescriptions   Medication Sig Dispense Refill   ??? albuterol (PROVENTIL;VENTOLIN;PROAIR) 90 mcg/actuation inhaler Inhale 1-2 puffs into the lungs every 6 hours as needed. 1 Inhaler 3   ??? apixaban (ELIQUIS) 5 mg Tab Take by mouth.     ??? blood sugar diagnostic (ACCU-CHEK AVIVA PLUS TEST STRP) Strp fsbs qd 100 strip 1   ??? blood-glucose meter (GLUCOSE MONITORING KIT) kit by Other route 2 (two) times daily. Use as instructed - fsbs bid      ??? CALCIUM CITRATE ORAL Take 1 tablet by mouth daily.       ??? cholecalciferol, vitamin D3, 2,000 unit Cap Take 1 capsule by mouth daily. 1 capsule 0   ??? cranberry 400 mg Cap Take by mouth.     ??? cyanocobalamin (B-12 DOTS) 500 MCG tablet Take 1 tablet (500 mcg total) by mouth daily. 30 tablet 0   ??? cyclobenzaprine (FLEXERIL) 10 MG tablet Take 1 tablet (10 mg total) by mouth 3 times a day as needed for Muscle spasms. 10 tablet 0   ??? DULoxetine (CYMBALTA) 60 MG capsule Take 1 capsule (60 mg total) by mouth daily. 30 capsule 5   ??? fluticasone (FLONASE) 50 mcg/actuation nasal spray Use 2 sprays into each nostril daily. 16 g 5   ??? gabapentin (NEURONTIN)  600 MG tablet Take 1 tablet (600 mg total) by mouth 3 times a day. 270 tablet 5   ??? ipratropium (ATROVENT) 0.03 % nasal spray Use 2 sprays into each nostril 2 times a day. 30 mL 5   ??? LACTOBACILLUS ACIDOPHILUS (PROBIOTIC ORAL) Take 1 capsule by mouth daily.     ??? levothyroxine (SYNTHROID, LEVOTHROID) 50 MCG tablet Take 1 tablet (50 mcg total) by mouth every morning before breakfast. 90 tablet 1   ??? levothyroxine (SYNTHROID, LEVOTHROID) 75 MCG tablet TAKE 1 TABLET(75 MCG) BY MOUTH DAILY 90 tablet 0   ??? pseudoephedrine-guaifenesin (MUCINEX D) 60-600 mg per tablet Take 1 tablet by mouth every 12 hours.     ??? UNABLE TO FIND Med Name: thorne ferrasorb 1 capsule 0   ??? valACYclovir  (VALTREX) 500 MG tablet TAKE 1 TABLET BY MOUTH EVERY DAY 30 tablet 0     No current facility-administered medications for this visit.       Review of Systems   Constitutional: Positive for chills, diaphoresis, fatigue and fever. Negative for weight loss.   HENT: Positive for ear pain, postnasal drip, rhinorrhea, sinus pressure and sore throat. Negative for ear discharge, hearing loss and trouble swallowing.    Eyes: Negative for photophobia.   Respiratory: Positive for cough. Negative for chest tightness, shortness of breath and wheezing.    Cardiovascular: Negative for chest pain and syncope.   Gastrointestinal: Negative for abdominal pain, bowel incontinence, diarrhea and vomiting.   Genitourinary: Positive for pelvic pain (pressure). Negative for bladder incontinence and dysuria.   Musculoskeletal: Positive for back pain, neck pain and neck stiffness.   Skin: Negative for rash.   Neurological: Negative for tingling, weakness, numbness, headaches and paresthesias.   All other systems reviewed and are negative.           Objective:     Vitals:    12/11/16 1439   BP: 114/70   Resp: 15   Temp: 99.1 ??F (37.3 ??C)   Weight: 195 lb (88.5 kg)   Height: 5' 7 (1.702 m)     Body mass index is 30.54 kg/m??.    Physical Exam   Vitals reviewed.  Constitutional: She is oriented to person, place, and time. Vital signs are normal. She appears well-developed and well-nourished. She is cooperative.  Non-toxic appearance. She does not have a sickly appearance. She appears ill. No distress.   HENT:   Head: Normocephalic and atraumatic.   Right Ear: Hearing, external ear and ear canal normal. Tympanic membrane is erythematous and bulging. Tympanic membrane is not injected. Tympanic membrane mobility is abnormal.   Left Ear: Hearing, external ear and ear canal normal. Tympanic membrane is not injected, not erythematous and not bulging. Tympanic membrane mobility is normal.   Nose: Mucosal edema and rhinorrhea present. Right sinus  exhibits maxillary sinus tenderness and frontal sinus tenderness. Left sinus exhibits maxillary sinus tenderness and frontal sinus tenderness.   Mouth/Throat: Uvula is midline and mucous membranes are normal. Posterior oropharyngeal edema and posterior oropharyngeal erythema present. No oropharyngeal exudate or tonsillar abscesses.   Post nasal drainage    Cardiovascular: Normal rate, regular rhythm, normal heart sounds and intact distal pulses.  Exam reveals no gallop and no friction rub.    No murmur heard.  Pulmonary/Chest: Effort normal and breath sounds normal. No accessory muscle usage. No respiratory distress. She has no decreased breath sounds. She has no wheezes. She has no rhonchi. She has no rales. She exhibits no tenderness.  Musculoskeletal:        Cervical back: She exhibits decreased range of motion (unable to flex, extend and laterally rotate in all directions), pain and spasm.        Thoracic back: She exhibits decreased range of motion and spasm.   Neurological: She is alert and oriented to person, place, and time.   Skin: Skin is warm and dry. No rash noted. She is not diaphoretic.   Psychiatric: She has a normal mood and affect. Her behavior is normal. Judgment and thought content normal.            Assessment/Plan:       Daliya was seen today for chest congestion, otalgia and back pain.    Diagnoses and all orders for this visit:    Bacterial URI (Primary)    Acute suppurative otitis media of right ear without spontaneous rupture of tympanic membrane, recurrence not specified    Other orders  -     methylPREDNISolone (MEDROL, PAK,) 4 mg tablet; follow package directions  -     AMOXicillin-clavulanate (AUGMENTIN) 875-125 mg per tablet; Take 1 tablet by mouth 2 times a day.                       Addison Naegeli, CNP

## 2016-12-11 NOTE — Telephone Encounter (Signed)
[facility]  Joni wants to know if PT will come in at 12:40 (if spot is [facility] open when she calls back)

## 2016-12-15 MED ORDER — cyclobenzaprine (FLEXERIL) 10 MG tablet
10 | ORAL_TABLET | Freq: Three times a day (TID) | ORAL | 0 refills | Status: AC | PRN
Start: 2016-12-15 — End: 2017-01-06

## 2016-12-15 NOTE — Telephone Encounter (Signed)
Back pain is a little better but still not 100%. It is upper back. She was told to call back if not better. Please advise

## 2016-12-15 NOTE — Telephone Encounter (Signed)
Pt informed.  Pt will need refill of muscle relaxer since only #10 was sent in.  Pended and Qty changed to #30.  Is this ok?

## 2016-12-15 NOTE — Unmapped (Signed)
sent 

## 2016-12-15 NOTE — Unmapped (Signed)
Continue with ice and muscle relaxant

## 2016-12-18 ENCOUNTER — Encounter: Payer: PRIVATE HEALTH INSURANCE | Attending: Family

## 2016-12-18 ENCOUNTER — Ambulatory Visit: Admit: 2016-12-18 | Discharge: 2016-12-18 | Payer: PRIVATE HEALTH INSURANCE

## 2016-12-18 DIAGNOSIS — M62830 Muscle spasm of back: Secondary | ICD-10-CM

## 2016-12-18 MED ORDER — lidocaine (LIDODERM) 5 %
5 | MEDICATED_PATCH | TOPICAL | 0 refills | 30.00000 days | Status: AC
Start: 2016-12-18 — End: 2017-01-06

## 2016-12-18 MED ORDER — HYDROcodone-acetaminophen (NORCO) 5-325 mg per tablet
5-325 | ORAL_TABLET | Freq: Four times a day (QID) | ORAL | 0 refills | 15.50000 days | Status: AC | PRN
Start: 2016-12-18 — End: 2016-12-25

## 2016-12-18 NOTE — Unmapped (Signed)
[facility]  [address]  [place] [address]    Name:  Wanda Rowe Date of Birth: [birth year] (56 y.o.)   MRN: [id number]    Date of Service:  12/18/2016    Subjective   HPI:     Chief Complaint   Patient presents with   ??? Back Pain     has been getting worse, hurts to breath   ??? Neck Pain     stiff neck     History of Present Illness:  [name] is a(n) 56 y.o. female here today for the following:   HPI   Last week was diagnosed with uri and is being treated with antibiotics.  Symptoms have resolved.  But last week Thursday night her dog pulled her off 4 stairs ,was a strong pull ,she had a little pain .  But woke up next morning with upper back pain ,neck pain ,and shoulder bilaterally.    It was getting better .  Was more on the right side now shifted to left side .  Feels like sharp spasms.  Its constant pain in the 7/10 on top of that has spasms.  Spasms started 2 days ago.    No  weaknes,numbness or tingling.  Has been taking flexeril .  It does not hurt.  Had ct chest done yesterday at [facility] was normal.          ROS:   Review of Systems   All other systems reviewed and are negative.      The following portions of the patient's history were reviewed and updated as appropriate:  past medical history, past surgical history, current medications, allergies, social history and family history       Objective:     Vitals:    12/18/16 1035   BP: 104/78   Pulse: 68   Resp: 12   Temp: 98.9 ??F (37.2 ??C)   Weight: 189 lb (85.7 kg)   Height: 5' 7 (1.702 m)     Body mass index is 29.6 kg/m??.    Physical Exam   Constitutional: She is oriented to person, place, and time. She appears well-developed and well-nourished.   HENT:   Head: Normocephalic and atraumatic.   Eyes: Pupils are equal, round, and reactive to light. Conjunctivae are normal.   Neck: Normal range of motion. Neck supple.   Cardiovascular: Normal rate and regular rhythm.    Pulmonary/Chest: Effort  normal and breath sounds normal.   Abdominal: Soft. Bowel sounds are normal.   Musculoskeletal: She exhibits no edema, tenderness or deformity.   Range of motion restricted on the right shoulder above horizontal and bot sides of neck on extreme lateral rotation.   Lymphadenopathy:     She has no cervical adenopathy.   Neurological: She is alert and oriented to person, place, and time. She displays normal reflexes. No cranial nerve deficit. She exhibits normal muscle tone. Coordination normal.   Skin: Skin is warm. She is not diaphoretic.               Assessment/Plan:     See related Assessment and Plan notes           1. Paraspinal muscle spasm    - HYDROcodone-acetaminophen (NORCO) 5-325 mg per tablet; Take 1 tablet by mouth every 6 hours as needed for Pain for up to 7 days.  Dispense: 30 tablet; Refill: 0  - lidocaine (LIDODERM) 5 %;  Place 1 patch onto the skin daily. Apply patch for 12 hours and then remove patch and leave off for 12 hours.  Dispense: 30 patch; Refill: 0     call if not better.    Towanda Octave, MD

## 2016-12-21 ENCOUNTER — Ambulatory Visit: Admit: 2017-01-19 | Payer: PRIVATE HEALTH INSURANCE

## 2016-12-21 NOTE — Unmapped (Signed)
This is a notification of an ED/Admission Alert generated by [facility].     This patient visited the following location: [facility]  Admit Date: 12/21/2016 09:02  Visit Type: Emergency  Chief complaint: muscle spasms  Diagnosis: muscle spasms ()  Alert Category:   Attending Physician:   Referring Physician: PATIENT,   Consulting Physician:   Copied Physician(s):     [facility] is a not-for-profit corporation that was founded in 1997 as a   community effort to enhance the ability to share health information   electronically in the [place] area. Today, [facility]   is one of the nation????????s largest and most financially sustainable regional   health information exchange (HIE) organizations.

## 2016-12-21 NOTE — Unmapped (Signed)
This is a notification of a Discharge Alert generated by [facility].     This patient visited the following location: [facility]  Admit Date: 12/21/2016 09:02  Discharge Date: 12/21/2016 13:43  Visit Type: Emergency  Chief complaint: Urinary tract infection, site not specified (N39.0)  Diagnosis: Urinary tract infection, site not specified; Viral infection, unspecified (N39.0; B34.9)  Alert Category:   Attending Physician: Madlyn Frankel  Referring Physician: PATIENT,   Consulting Physician:   Copied Physician(s):     [facility] is a not-for-profit corporation that was founded in 1997 as a   community effort to enhance the ability to share health information   electronically in the [place] area. Today, [facility]   is one of the nation????????s largest and most financially sustainable regional   health information exchange (HIE) organizations.

## 2016-12-21 NOTE — Unmapped (Signed)
Patient: Wanda Rowe DOB: 07/27/1960 MRN: [id number]    CHIEF COMPLAINT  Chief Complaint  Patient presents with  ?????? pain    HPI  Wanda Rowe Wanda Rowe is a 56 y.o. female who presents complaining of moderate aching /  spasm pain all over but worse in back, not exacerbated or relieved by  anything but back pain is uncomfortable to palpate, move or breath deeply,  worsening for 5 days.  States feels stiff all over and states had temp 101 last  night and wet herself once.  No other reported exacerbating or relieving factors  or associated symptoms reported.  Records show previous back pain and history of  chest pain when under stress with negative workup.    States had URI symptoms including sore throat, sinus pressure/congestion and  chest aching with cough and was diagnosed by [facility] [place] with bacterial URI and right OM  and treated with course of Augmentin starting 12/11/16.  Was also prescribed medrol and flexeril for thoracic back pain.    States fell down 4 steps when dog pulled her over 4 days ago, no LOC, no HA but  left knee (h/o TKR) and back sore since.  Is ambulatory and drove here.  On eliquis for h/o DVT, also has IVC filter -  states did not hit head and no HA now.    Seen at OSH 02/17/16 - had negative CTA chest:  CT ANGIOGRAPHY OF THE CHEST 12/17/16  HISTORY: Chest pain with breathing, history of breast carcinoma, history of  blood clots  IMPRESSION:  1. No CT evidence for pulmonary emboli.  2. Small sliding-type hiatal hernia; prior gastric surgery.    Seen 12/18/16 by [facility] and diagnosed with paraspinal muscle spasm.  Prescribed lidoderm patches, norco and flexeril .  States not taking any of those because they were not helping.      PAST MEDICAL HISTORY  Past Medical History:  Diagnosis Date  ?????? Anemia   in past  ?????? Anxiety  ?????? Arthritis   knees  ?????? Asthma  ?????? Awareness under anesthesia   woke up during endoscopy  ?????? Back problem   occ back ache  ?????? Cancer (CMS HCC)   right breast- chemo. and radiation in  2014 & 2015, hx of Lumpectomy  ?????? Chest pain   when under stress-testing done & no cause of chest pain was found  ?????? Complication of anesthesia   rxn to succinylcholine-day after surgery, pt  had difficulty w/ muscle  movement-dr j collins in anesthesia dept notified- Informed Dr. Unknown Foley, A[facility]  for upcoming procedure on 07/07/16  ?????? Dysplasia  ?????? GERD (gastroesophageal reflux disease)   no meds  ?????? Hernia   hiatal-resolved with wt loss surg  ?????? Hypertension   in past & no longer taking medication  ?????? Irregular heart beat   MVP & heart murmur-used to take pre-dental antibiotics  ?????? Liver disorder   pt told she has a fatty liver-resolved per pt  ?????? Mobility impaired 07/01/2016   uses cane currently d/t recent s/p left knee replacement on 05/26/16  ?????? Occlusive thrombus 2017   dvts in past; IVC filter inserted 05/22/16  ?????? Osteopenia  ?????? RSD (reflex sympathetic dystrophy)  ?????? Suspected sleep apnea 07/01/2016   pt. states prior to knee replacement when she was given preop meds, she heard  the nurses calling her name and states that something had dropped suddenly: pt.  thinks it may have been her 02. She states she was advised to  get a sleep study  in the future. pt. is not sure what med caused this to happen prior to  surgery  on 05/26/16.  ?????? Thyroid disorder   hypothyroidism  ?????? Venous thrombosis and embolism    SURGICAL HISTORY  Past Surgical History:  Procedure Laterality Date  ?????? HX BREAST BIOPSY  july 2014   right  ?????? HX BREAST LUMPECTOMY  02-2013   right; also bilat reduction; back to surg next day for post op bleeding  ?????? HX COLPOSCOPY  ?????? HX FOOT SURGERY   right  ?????? HX GUM SURGERY   done for receding gums  ?????? HX HYSTERECTOMY, TOTAL ABDOMINAL  ?????? HX KNEE SURGERY   left x 3, right x 1  ?????? HX LAPAROSCOPY  ?????? HX OTHER SURGICAL HISTORY  jan 2014   gastric sleeve  ?????? HX OTHER SURGICAL HISTORY   ins. portacath  ?????? HX TOTAL KNEE ARTHROPLASTY Left 05/26/2016  ?????? HX UPPER GI ENDOSCOPY   mult in  past due to stomach polyps  ?????? HX VAGINAL DELIVERY  ?????? HX VASCULAR SURGERY  05/22/2016   IVC filter  ?????? REMOVAL OF OVARY/TUBE(S)   bilateral salpingo-oophorectomy      CURRENT MEDICATIONS  Prior to Admission medications  Medication Sig Start Date End Date Taking? Authorizing Provider  albuterol (PROVENTIL HFA) 90 mcg/actuation HFA Aerosol Inhaler Take 2 Puffs by  inhalation every 6 hours as needed.    Provider, Historical  apixaban (ELIQUIS) 5 mg Tablet Take 1 Tab by mouth 2 times daily for 150 days.  Take 1 tab my mouth 2 times daily for 5 months. 08/20/16 01/17/17  Copely,  Luci Bank., MD  CALCIUM CITRATE PO Take  by mouth daily.    Provider, Historical  CHOLECALCIFEROL, VITAMIN D3, PO Take  by mouth.    Provider, Historical  cyclobenzaprine (FLEXERIL) 10 mg tablet Take 10 mg by mouth 3 times daily as  needed for muscle spasm.    Provider, Historical  DULoxetine (CYMBALTA) 60 mg Capsule, Delayed Release(E.C.) Take 60 mg by mouth  daily. Reported on 07/24/2015    Provider, Historical  estrogens, conjugated, (PREMARIN) 0.625 mg/gram Cream Insert into vagina twice a  week.  Patient not taking: Reported on 09/07/2016 02/20/16   Arnoldo Hooker., MD  fluticasone [facility]) 50 mcg/Actuation NA nasal spray Spray 2 Sprays into nose  daily. Each nostril    Provider, Historical  gabapentin (NEURONTIN) 600 mg tablet Take 600 mg by mouth 2 times daily.  Provider, Historical  ipratropium (ATROVENT) 0.03 % NA nasal spray Spray 2 Sprays into nose 2 times  daily. Each nostril     Provider, Historical  lactobacillus rhamnosus (CULTURELLE) 10 billion cell Take 1 Cap by mouth daily.  Provider, Historical  levothyroxine (SYNTHROID) 75 mcg PO tablet Take 66 mcg by mouth daily.  Provider, Historical  oxyCODONE-acetaminophen (PERCOCET) 5-325 mg per tablet Take 1-2 Tabs by mouth  every 6 hours as needed for Pain.    Provider, Historical    ALLERGIES  Allergies  Allergen Reactions  ?????? Azithromycin    **DOES NOT WORK**    ??????  Succinylcholine Other (See Comments)    Delayed paralysis  Difficulty w/ muscle movement the day after surgery  Other reaction(s): Muscle Aches  Difficulty w/ muscle movement the day after surgery  ?????? Sulfasalazine Rash  ?????? Tegaderm Rash  ?????? Silver Rash  ?????? Sulfa (Sulfonamide Antibiotics) Swelling and Rash  ?????? Sulfanilamide Rash    Sulfonamides    FAMILY HISTORY  Family History  Problem Relation Age of Onset  ?????? High Blood Pressure Mother  ?????? Diabetes Father  ?????? High Blood Pressure Father  ?????? Heart Problems Paternal Grandfather       mi  ?????? Cancer Other       unspecified grandmother  ?????? Diabetes Other       unspecified grandmother  ?????? Stroke Other       unspecified grandmother  ?????? Colon Cancer Maternal Grandfather 60  ?????? Cancer Maternal Grandmother 60       Bladder  ?????? Anesthesia Complications Neg Hx      SOCIAL HISTORY  Social History    Social History  ?????? Marital status: Single    Spouse name: N/A  ?????? Number of children: N/A  ?????? Years of education: N/A    Social History Main Topics  ?????? Smoking status: Never Smoker  ?????? Smokeless tobacco: Never Used  ?????? Alcohol use Yes     Comment: very occasionally  ?????? Drug use: No  ?????? Sexual activity: Yes    Partners: Male    Other Topics Concern  ?????? Not on file    Social History Narrative  ?????? No narrative on file    REVIEW OF SYSTEMS  See HPI for further details  ALL OTHER REVIEW OF SYSTEMS OTHERWISE NEGATIVE    INITIAL PHYSICAL EXAM  Vital Signs: BP 131/73    Pulse 87    Temp 98.8 ??????F (37.1 ??????C) (Oral)    Resp 20    Ht 5' 8 (1.727 m)    Wt 185 lb (83.9 kg)    LMP 04/12/2008    SpO2 100%     BMI 28.13 kg/m??????  Constitutional: Well developed, well nourished, no distress, non-toxic  appearance  Eyes: PERRL, EOMI, Conjunctiva normal, sclera normal, no discharge  HENT: Head - atraumaitc, Ears - bilateral external ears normal, TMs normal,  mastoids nontender, no swelling  Nose - no epistaxis, no deformity,  Oropharynx - mucous membranes moist, no redness or  swelling  Neck - supple, no jugular venous distention, no stridor  Respiratory: no respiratory distress, clear to auscultation bilaterally  Cardiovascular: normal heart rate, regular rhythm  GI: abdomen soft, nontender, nondistended  GU: no CVA tenderness, no suprapubic tenderness  Musculoskeletal: Extremities - No clubbing, no cyanosis, no edema  No joint redness, warmth, swelling - s/o left TKR, healed scar mild bilateral  joint line tenderness, feels stable  No calf, anterior compartment or thigh tenderness or swelling, distal  movement,sensation, perfusion intact  Back - diffuse mid back paraspinous muscle tenderness, no midline cervical,  thoracic or lumbar tenderness  Neurologic: Alert, oriented to person place and time  Normal strength and sensation in all four extremities  Cranial nerves II-XII intact and symmetric  Integument: Warm, Dry, No erythema, no rash  Psychiatric: affect normal, judgement normal, mood normal    EKG (interpreted by me):    ED EKG Results  ECG     Status: None   Narrative                               THE [facility] ED                                               Test Date:    2016-12-21 13:08:65  Pat Name:     [facility]              Department:   [facility]  Patient ID:   [id number]                 Room:  Gender:       Female                   Technician:   [id number]  DOB:          Sep 11, 1960               Requested By: Roselee Culver.  Order Number: [id number]                Reading MD:   Madlyn Frankel, MD                                   Measurements  Intervals                              Axis  Rate:         102                      P:            45  PR:           135                      QRS:          3  QRSD:         94                       T:            60  QT:           332  QTc:          433                             Interpretive Statements  Sinus tachycardia  Abnormal R-wave progression, early transition  Electronically Signed On 12-21-2016 9:56:21 EST by Madlyn Frankel,  MD        RADIOLOGY:  DIAG-T-SPINE 2-VIEWS  Final Result by Interface, Incoming Radiology Results (11/19 1055)  Impression: No acute osseous abnormality.    Signed By: Deneen Harts MD, Conni Elliot 3-VIEWS LT  Final Result by Interface, Incoming Radiology Results (11/19 1053)  Impression: No acute osseous abnormality. Surgical changes of left total knee  arthroplasty, which is intact.    Signed By: Deneen Harts MD, Lynnae January PA & LATERAL  Final Result by Interface, Incoming Radiology Results (11/19 1052)  Impression: No acute cardiopulmonary process.    Signed By: Deneen Harts MD, Luisa Hart      LABORATORY:  Labs Reviewed  PT (PRO TIME INCLUDES INR) - Abnormal; Notable for the following:      Result Value   Protime 22.0 (*)   INR 1.9 (*)   All other components within normal limits  APTT - Abnormal; Notable for the following:   PTT 43.3 (*)   All other components within normal limits  URINALYSIS WITH REFLEX TO CULTURE - Abnormal; Notable for  the following:   Protein, UA 30 (*)   Ketones, UA 15 (*)   Leukocyte esterase UA Small (*)   WBC 5-10 (*)   Bacteria, UA Occasional (*)   Hyaline Casts, UA 3-5 (*)   All other components within normal limits  LIVER PROFILE - Abnormal; Notable for the following:   Total Protein 8.4 (*)   Globulin 4.1 (*)   All other components within normal limits  CBC (COMPLETE BLOOD COUNT) - Abnormal; Notable for the following:   WBC 12.5 (*)   All other components within normal limits  DIFFERENTIAL - Abnormal; Notable for the following:   Neutrophils Absolute 10.2 (*)   Monocytes Absolute 1.1 (*)   All other components within normal limits  POCT GLUCOSE - Abnormal; Notable for the following:   POC Glucose 150 (*)   All other components within normal limits  POC CHLORIDE - Abnormal; Notable for the following:   POC Chloride 96 (*)   All other components within normal limits  POC ANION - Abnormal; Notable for the following:   POC Anion Gap 19 (*)   All other components within normal limits  RAPID  INFLUENZA A&B ANTIGEN - [facility] ONLY   Narrative:   Normal Range: None Detected  BLOOD CULTURE (AEROBIC & ANAEROBIC)  BLOOD CULTURE (AEROBIC & ANAEROBIC)  ROUTINE URINE CULTURE  CARD ENZ (TROPONIN I)  MAGNESIUM  B NATRIURETIC PEPTIDE  CK, TOTAL  LACTIC ACID  LIPASE  POC SODIUM  POC POTASSIUM  POC BUN  POC CREATININE  POC IONIZED CALCIUM  POC TCO2      TREATMENTS:  Medications  sodium chloride 0.9 % bolus 1,000 mL (0 mL Intravenous Bag Completed 12/21/16  1045)  sodium chloride 0.9 % bolus 1,000 mL (0 mL Intravenous Bag Completed 12/21/16  1201)  sodium chloride 0.9 % bolus 500 mL (0 mL Intravenous Bag Completed 12/21/16  1238)  cefTRIAXone (ROCEPHIN) 1 g in sodium chloride 0.9 % syringe for injection (0 g  Intravenous Bag Completed 12/21/16 1141)  phenazopyridine (PYRIDIUM) tablet 200 mg (200 mg Oral Given 12/21/16 1146)      RESPONSE TO TREATMENT / REPEAT EXAM:  The patient felt much better after the above treatments    ED COURSE and MEDICAL DECISION MAKING:  Perftinent Labs & Imaging studies and patient Medical Records reviewed.  (see chart for details)  Diffuse constellation of symptoms suggests underlying viral etiology.  Aching is diffuse with more focal back and knee pain - no sign of  rhabdomyolysis, thoracic fracture, left knee fracture or hardware problem.  Just had CTA neg for PE, pneumonia or any emergent intrathoracic process.  Has no abdominal pain or GI symptoms and back pain does not appear to be due to  cholecystitis, cholangitis, choledocholithiasis, pancreatitis, hepatitis or  emergent sequelae of peptic ulcer disease.  No dehydration or electrolyte  abnormality or DKA or AKI.  Flu neg but still suspicious for influenza or nonspecific viral strain but  beyond tamiflu window.  Oropharynx and TMs unremarkable and no sign of mastoiditis or severe sinusitis  or strep (just had Augmentin) or PTA.  No sign of cellulitis or any suggestion of pericarditis or pneumothorax,  possible pleurisy.  History,  diffuse symptoms and exam do not suggest CNS infection, neck supple.  On anticoagulation but nonfocal neuro exam and no HA so see no indication for  head CT.  No suggestion of ischemia or CHF.  Several SIRS criteria but overall, presentation does not suggest systemic  bacterial infection,  normal lactate.  UA borderline and does not appear to be severe enough to suggest urosepsis -  other symptoms URI and suggest viral.  Given reportedly wet herself however, will treat for UTI and likely bladder  spasm.  Back and knee pain seems musculoskeletal - use norco, flexeril, may need PT, f/u  ortho for knee  Patient understands to follow up on culture results, see PCM, return to ED for  new / worse symptoms.  I offered pain medication here but only if she could get a ride home - she  stated she wanted to go home and would use the norco and flexeril that she had.    IMPRESSION:  Final Diagnosis:  1. Urinary tract infection without hematuria, site unspecified  2. Viral syndrome      PLAN:  The patient was given the following medications to go home with:  New Prescriptions   CEFUROXIME (CEFTIN) 500 MG TABLET    Take 1 Tab by mouth 2 times daily for 10  days.   PHENAZOPYRIDINE (PYRIDIUM) 200 MG TABLET    Take 1 Tab by mouth 3 times daily  for 3 days.      Follow up with:  Irena Cords., MD  [address]  [address]  [place] [address]  [phone]    Call in 1 day        This was all discussed with the patient who voiced understnading of and  agreement with the plan and stated that all their questions had been answered  satisfactorily.    The patient was given written manamement instructions and educational material  regarding their condition as well as follow up instructions and return  precautions.  The patient was informed that they would be contacted and given additional  instructions if there were any lab results or Xray reports that required follow  up.        MIPS:    Documentation of Current Medications  (#130):  ?????? (B1478) Reviewed      Screening For Hypertension and Follow-up (#317):  BP 131/73    Pulse 87    Temp 98.8 ??????F (37.1 ??????C) (Oral)    Resp 20    Ht 5' 8  (1.727 m)    Wt 185 lb (83.9 kg)    LMP 04/12/2008    SpO2 100%    BMI 28.13  kg/m??????    ?????? Patient previously diagnosed with HTN-not eligible for  this screening measure      Screening For Tobacco Use and Cessation Intervention (#226):   reports that she has never smoked. She has never used smokeless tobacco.        Please note that some or all of this record was generated using voice  recognition software.  Some of the record may have also been typed entry.  If  there are any questions about the content of this document, please contact the  author as some errors of transcription may have occurred.      CHART ELECTRONICALLY SIGNED: Roselee Culver., MD 12/21/2016 1:26 PM

## 2017-01-04 ENCOUNTER — Encounter: Payer: PRIVATE HEALTH INSURANCE | Attending: Radiation Oncology

## 2017-01-05 LAB — LIPID PANEL
Cholesterol, Total: 182 mg/dL (ref 100–199)
HDL: 68 mg/dL (ref >39)
LDL Calculated: 97 mg/dL (ref 0–99)
Triglycerides: 86 mg/dL (ref 0–149)
VLDL Cholesterol Cal: 17 mg/dL (ref 5–40)

## 2017-01-05 LAB — IRON: Iron: 109 ug/dL (ref 27–159)

## 2017-01-05 LAB — VITAMIN B12: Vitamin B-12: 722 pg/mL (ref 232–1245)

## 2017-01-05 LAB — CBC/DIFF AMBIGUOUS DEFAULT
Basophils Absolute: 0 10*3/uL (ref 0.0–0.2)
Basophils Relative: 0 %
Eosinophils Absolute: 0.1 10*3/uL (ref 0.0–0.4)
Eosinophils Relative: 1 %
Hematocrit: 35.4 % (ref 34.0–46.6)
Hemoglobin: 12.1 g/dL (ref 11.1–15.9)
Immature Granulocytes Absolute: 0 10*3/uL (ref 0.0–0.1)
Immature Granulocytes: 0 %
Lymphocytes Absolute: 1.7 10*3/uL (ref 0.7–3.1)
Lymphocytes Relative: 35 %
MCH: 30 pg (ref 26.6–33.0)
MCHC: 34.2 g/dL (ref 31.5–35.7)
MCV: 88 fL (ref 79–97)
Monocytes Absolute: 0.4 10*3/uL (ref 0.1–0.9)
Monocytes Relative: 8 %
Neutrophils Absolute: 2.8 10*3/uL (ref 1.4–7.0)
Neutrophils Relative: 56 %
Platelets: 283 10*3/uL (ref 150–379)
RBC: 4.03 x10E6/uL (ref 3.77–5.28)
RDW: 13.8 % (ref 12.3–15.4)
WBC: 5 10*3/uL (ref 3.4–10.8)

## 2017-01-05 LAB — SPECIMEN STATUS REPORT

## 2017-01-05 LAB — COMPREHENSIVE METABOLIC PANEL
A/G Ratio: 1.7 (ref 1.2–2.2)
ALT: 13 IU/L (ref 0–32)
AST: 16 IU/L (ref 0–40)
Albumin: 4.4 g/dL (ref 3.5–5.5)
Alkaline Phosphatase: 80 IU/L (ref 39–117)
BUN/Creatinine Ratio: 26 (ref 9–23)
BUN: 19 mg/dL (ref 6–24)
CO2: 24 mmol/L (ref 20–29)
Calcium: 9.9 mg/dL (ref 8.7–10.2)
Chloride: 101 mmol/L (ref 96–106)
Creatinine: 0.73 mg/dL (ref 0.57–1.00)
GFR MDRD Af Amer: 106 mL/min/{1.73_m2} (ref >59)
Globulin, Total: 2.6 g/dL (ref 1.5–4.5)
Glucose: 98 mg/dL (ref 65–99)
Potassium: 4.3 mmol/L (ref 3.5–5.2)
Sodium: 141 mmol/L (ref 134–144)
Total Bilirubin: 0.5 mg/dL (ref 0.0–1.2)
Total Protein: 7 g/dL (ref 6.0–8.5)
eGFR Non-Afr. American: 92 mL/min/{1.73_m2} (ref >59)

## 2017-01-05 LAB — TSH: TSH: 1.99 u[IU]/mL (ref 0.450–4.500)

## 2017-01-05 LAB — T3: T3, Total: 93 ng/dL (ref 71–180)

## 2017-01-05 LAB — FERRITIN: Ferritin: 159 ng/mL (ref 15–150)

## 2017-01-05 LAB — T4, FREE: T4, Free (Direct): 0.99 ng/dL (ref 0.82–1.77)

## 2017-01-05 LAB — FOLATE: Folic Acid: 12.8 ng/mL (ref >3.0)

## 2017-01-06 ENCOUNTER — Ambulatory Visit: Admit: 2017-01-06 | Discharge: 2017-01-06 | Payer: PRIVATE HEALTH INSURANCE | Attending: Radiation Oncology

## 2017-01-06 DIAGNOSIS — Z08 Encounter for follow-up examination after completed treatment for malignant neoplasm: Secondary | ICD-10-CM

## 2017-01-06 NOTE — Patient Instructions (Signed)
If you have any questions about today's visit please contact the radiation oncologist at:    [facility]:   [phone]  or  [facility]: [phone]    If you need to contact the [facility] please call:    Monday through Friday: [phone] or Toll Free: [phone]

## 2017-01-06 NOTE — Unmapped (Signed)
Chief Complaint   Patient presents with   ??? Follow-up       Diagnosis  Cancer Staging  Breast cancer (CMS Dx)  Staging form: Breast, AJCC 7th Edition  - Clinical: Stage IA (T1b, N0, cM0) - Signed by Andree Coss, MD on 02/27/2013  - Pathologic: Stage Unknown (TX, N0(i-), Free text: Stage) - Signed by Andree Coss, MD on 02/27/2013      Previous Treatments  12/26/2012- Neoadjuvant TCHP (Dr. Selena Batten)  02/09/2013- bilateral breast reduction with right partial mastectomy and SLNBx (Dr. Vanice Sarah)  04/18/2013- Adjuvant right whole breast RT 42.56 Gy in 16 fractions without boost due to lack of seroma target (Dr. Fransisco Beau)     History of Present Illness  Wanda Rowe presents to the radiation oncology clinic for routine follow up. She is a previous pt of Dr. Terald Sleeper and presents today to establish care with me. She completed adjuvant right breast radiation over 3 years ago. She normally does not perform self breast exams however did perform a self breast exam last week prior to her appointment with me today and did note some tenderness to palpation of the lateral aspect of her right breast. She states her breasts have always felt lumpy to her and she denies any new palpable masses. She underwent MRI of her breasts in August and was noted to have a new area of enhancement in the retroareolar region of the left breast with no abnormality in the right breast. MRI guided biopsy of the left breast only showed columnar cell alteration without atypia and foreign body granuloma. In general she has been alternating MRIs and mammograms of the breasts and continues to follow with her surgeon Dr. Vanice Sarah. She is not on any adjuvant hormonal therapy as her tumor was ER/PR negative. She is otherwise doing well with no new complaints today. Of note she will likely be moving to [place], [place] at the end of the summer for her job. At this time they have allowed her to work from home here in [place].       Review of Systems   Constitutional: Negative  for activity change, appetite change, fatigue and weight loss.   Respiratory: Negative for cough and shortness of breath.    Gastrointestinal: Negative for abdominal pain.   Musculoskeletal: Negative for back pain.   Neurological: Negative for headaches.   All other systems reviewed and are negative.      Allergies  Succinylcholine; Zithromax [azithromycin]; Silver; Sulfa (sulfonamide antibiotics); and Sulfanilamide    Medications  Outpatient Encounter Prescriptions as of 01/06/2017   Medication Sig Dispense Refill   ??? albuterol (PROVENTIL;VENTOLIN;PROAIR) 90 mcg/actuation inhaler Inhale 1-2 puffs into the lungs every 6 hours as needed. 1 Inhaler 3   ??? apixaban (ELIQUIS) 5 mg Tab Take by mouth.     ??? blood sugar diagnostic (ACCU-CHEK AVIVA PLUS TEST STRP) Strp fsbs qd 100 strip 1   ??? blood-glucose meter (GLUCOSE MONITORING KIT) kit by Other route 2 (two) times daily. Use as instructed - fsbs bid      ??? CALCIUM CITRATE ORAL Take 1 tablet by mouth daily.       ??? cholecalciferol, vitamin D3, 2,000 unit Cap Take 1 capsule by mouth daily. 1 capsule 0   ??? cyanocobalamin (B-12 DOTS) 500 MCG tablet Take 1 tablet (500 mcg total) by mouth daily. 30 tablet 0   ??? DULoxetine (CYMBALTA) 60 MG capsule Take 1 capsule (60 mg total) by mouth daily. 30 capsule 5   ??? fluticasone (FLONASE)  50 mcg/actuation nasal spray Use 2 sprays into each nostril daily. 16 g 5   ??? gabapentin (NEURONTIN) 600 MG tablet Take 1 tablet (600 mg total) by mouth 3 times a day. 270 tablet 5   ??? ipratropium (ATROVENT) 0.03 % nasal spray Use 2 sprays into each nostril 2 times a day. 30 mL 5   ??? LACTOBACILLUS ACIDOPHILUS (PROBIOTIC ORAL) Take 1 capsule by mouth daily.     ??? levothyroxine (SYNTHROID, LEVOTHROID) 50 MCG tablet Take 1 tablet (50 mcg total) by mouth every morning before breakfast. 90 tablet 1   ??? pseudoephedrine-guaifenesin (MUCINEX D) 60-600 mg per tablet Take 1 tablet by mouth every 12 hours.     ??? UNABLE TO FIND Med Name: Wanda Rowe ferrasorb 1 capsule  0   ??? [DISCONTINUED] AMOXicillin-clavulanate (AUGMENTIN) 875-125 mg per tablet Take 1 tablet by mouth 2 times a day. 20 tablet 0   ??? [DISCONTINUED] cranberry 400 mg Cap Take by mouth.     ??? [DISCONTINUED] cyclobenzaprine (FLEXERIL) 10 MG tablet Take 1 tablet (10 mg total) by mouth 3 times a day as needed for Muscle spasms. 30 tablet 0   ??? [DISCONTINUED] levothyroxine (SYNTHROID, LEVOTHROID) 75 MCG tablet TAKE 1 TABLET(75 MCG) BY MOUTH DAILY 90 tablet 0   ??? [DISCONTINUED] lidocaine (LIDODERM) 5 % Place 1 patch onto the skin daily. Apply patch for 12 hours and then remove patch and leave off for 12 hours. 30 patch 0   ??? [DISCONTINUED] methylPREDNISolone (MEDROL, PAK,) 4 mg tablet follow package directions 21 Package 0   ??? [DISCONTINUED] valACYclovir (VALTREX) 500 MG tablet TAKE 1 TABLET BY MOUTH EVERY DAY 30 tablet 0     No facility-administered encounter medications on file as of 01/06/2017.         Histories  She has a past medical history of Anemia; Anxiety; Asthma; Breast cancer (CMS Dx); Diabetes mellitus (CMS Dx); DVT (deep vein thrombosis) in pregnancy (CMS Dx); GERD (gastroesophageal reflux disease); Hyperlipidemia; Hypertension; Hypothyroidism; Mitral valve prolapse; Polyp of stomach; and RSD (reflex sympathetic dystrophy).    She has a past surgical history that includes Anterior cruciate ligament repair; Meniscectomy; Dental surgery; Hysterectomy; Foot surgery; gastric sleeve; Breast surgery (Right); Esophagogastroduodenoscopy (N/A, 07/19/2015); Joint replacement (Left); IR IVC filter placement (05/22/2016); Tympanostomy tube placement; Wisdom tooth extraction; Colonoscopy w/ polypectomy; Colposcopy; birth mark; Breast biopsy (Left); Knee surgery (05/2016); and Colonoscopy.    Her family history includes Breast Cancer in her paternal grandmother; Cancer in her maternal grandmother; Colon Cancer in her maternal grandfather; Diabetes in her father and maternal grandmother; Factor VIII deficiency in her father;  Hypertension in her father and mother; Other in her brother and mother; Stroke in her maternal grandmother.    She reports that she has never smoked. She has never used smokeless tobacco. She reports that she drinks alcohol. She reports that she does not use drugs.     Originally from [place], [place] where her family still loves.       Blood pressure 114/56, pulse 84, temperature 98.4 ??F (36.9 ??C), temperature source Oral, resp. rate 12, weight 191 lb 12.8 oz (87 kg).  Physical Exam   Constitutional: She is oriented to person, place, and time. She appears well-developed and well-nourished.   HENT:   Head: Normocephalic and atraumatic.   Pulmonary/Chest: Effort normal. No respiratory distress. She has no wheezes. Right breast exhibits no inverted nipple, no mass, no nipple discharge, no skin change and no tenderness. Left breast exhibits no inverted nipple, no mass,  no nipple discharge, no skin change and no tenderness.   S/p bilateral breast reduction. She has mild fibrosis of the right breast in comparison to the left breast. No palpable breast masses or axillary adenopathy. No tenderness to palpation on exam and no palpable abnormality in the lateral aspect of the right breast where pt had previous tenderness.    Musculoskeletal: Normal range of motion.   Neurological: She is alert and oriented to person, place, and time.   Skin: Skin is warm and dry.         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  She is clinically doing well with no evidence of disease on exam. Recent MRI guided biopsy of the left breast was benign.                      Plan  Plan to see pt back in 1 year for routine follow up. She may be relocating next year to [place]. I am always happy to see her in follow up before she moves and can help assist her getting set up with physicians in [place] to follow her for her history of breast cancer.

## 2017-01-08 ENCOUNTER — Encounter: Attending: Family

## 2017-01-14 ENCOUNTER — Ambulatory Visit: Admit: 2017-01-14 | Discharge: 2017-01-14 | Payer: PRIVATE HEALTH INSURANCE

## 2017-01-14 DIAGNOSIS — E039 Hypothyroidism, unspecified: Secondary | ICD-10-CM

## 2017-01-14 MED ORDER — DULoxetine (CYMBALTA) 60 MG capsule
60 | ORAL_CAPSULE | Freq: Every day | ORAL | 5 refills | Status: AC
Start: 2017-01-14 — End: 2017-07-15

## 2017-01-14 MED ORDER — gabapentin (NEURONTIN) 600 MG tablet
600 | ORAL_TABLET | Freq: Three times a day (TID) | ORAL | 5 refills | Status: AC
Start: 2017-01-14 — End: 2017-07-15

## 2017-01-14 MED ORDER — vit b complex w-b 12 tablet
ORAL_TABLET | Freq: Every day | ORAL | 0 refills | Status: AC
Start: 2017-01-14 — End: ?

## 2017-01-14 MED ORDER — levothyroxine (SYNTHROID, LEVOTHROID) 50 MCG tablet
50 | ORAL_TABLET | Freq: Every morning | ORAL | 5 refills | Status: AC
Start: 2017-01-14 — End: 2017-07-15

## 2017-01-14 NOTE — Assessment & Plan Note (Signed)
neurontin

## 2017-01-14 NOTE — Assessment & Plan Note (Signed)
Cont synthroid

## 2017-01-14 NOTE — Unmapped (Signed)
[facility]  [address]  [place] [address]    Name:  Wanda Rowe Date of Birth: [birth year] (56 y.o.)   MRN: [id number]    Date of Service:  01/14/2017     Subjective:     Chief Complaint   Patient presents with   ??? Hypothyroidism   ??? Hyperlipidemia   ??? Anxiety     History of Present Illness:  Wanda Rowe is a(n) 56 y.o. female here today for the following:   Fu above sees heme for eliquis        Current Outpatient Prescriptions   Medication Sig Dispense Refill   ??? albuterol (PROVENTIL;VENTOLIN;PROAIR) 90 mcg/actuation inhaler Inhale 1-2 puffs into the lungs every 6 hours as needed. 1 Inhaler 3   ??? apixaban (ELIQUIS) 5 mg Tab Take 5 mg by mouth 2 times a day.               ??? blood sugar diagnostic (ACCU-CHEK AVIVA PLUS TEST STRP) Strp fsbs qd 100 strip 1   ??? blood-glucose meter (GLUCOSE MONITORING KIT) kit by Other route 2 (two) times daily. Use as instructed - fsbs bid      ??? CALCIUM CITRATE ORAL Take 1 tablet by mouth daily.       ??? cholecalciferol, vitamin D3, 2,000 unit Cap Take 1 capsule by mouth daily. 1 capsule 0   ??? DULoxetine (CYMBALTA) 60 MG capsule Take 1 capsule (60 mg total) by mouth daily. 30 capsule 5   ??? fluticasone (FLONASE) 50 mcg/actuation nasal spray Use 2 sprays into each nostril daily. 16 g 5   ??? gabapentin (NEURONTIN) 600 MG tablet Take 1 tablet (600 mg total) by mouth 3 times a day. 270 tablet 5   ??? ipratropium (ATROVENT) 0.03 % nasal spray Use 2 sprays into each nostril 2 times a day. 30 mL 5   ??? LACTOBACILLUS ACIDOPHILUS (PROBIOTIC ORAL) Take 1 capsule by mouth daily.     ??? levothyroxine (SYNTHROID, LEVOTHROID) 50 MCG tablet Take 1 tablet (50 mcg total) by mouth every morning before breakfast. 30 tablet 5   ??? UNABLE TO FIND Med Name: thorne ferrasorb 1 capsule 0   ??? vit b complex w-b 12 tablet Take 1 tablet by mouth daily. 30 tablet 0     No current facility-administered medications for this visit.       Review of Systems    Constitutional: Negative for fatigue.   All other systems reviewed and are negative.           Objective:     Vitals:    01/14/17 1004   BP: 120/80   Pulse: 74   Resp: 15   Weight: 190 lb (86.2 kg)   Height: 5' 7 (1.702 m)     Body mass index is 29.76 kg/m??.    Physical Exam   Constitutional: She appears well-developed and well-nourished.   HENT:   Head: Normocephalic and atraumatic.   Cardiovascular: Normal rate, regular rhythm and normal heart sounds.  Exam reveals no gallop and no friction rub.    No murmur heard.  Pulmonary/Chest: Effort normal and breath sounds normal.   Abdominal: Soft. Bowel sounds are normal.   Musculoskeletal: Normal range of motion.   Psychiatric: She has a normal mood and affect. Her behavior is normal.            Assessment/Plan:       Acquired hypothyroidism  Cont synthroid  Anxiety and depression  Cont cymbalta    RSD lower limb  neurontin    H/O bariatric surgery  Stress b complex       Return in about 6 months (around 07/15/2017).         Maribella Kuna Johnn Hai, MD

## 2017-01-14 NOTE — Unmapped (Signed)
Stress b complex

## 2017-01-14 NOTE — Assessment & Plan Note (Signed)
Cont cymbalta

## 2017-01-15 NOTE — Unmapped (Signed)
This is a notification of a Discharge Alert generated by [facility].     This patient visited the following location: [facility] ([facility]  Admit Date: 01/15/2017 15:07  Discharge Date: 01/15/2017 17:00  Visit Type: Emergency  Chief complaint: Pain in left knee (M25.562)  Diagnosis: Pain in left knee (M25.562)  Alert Category:   Attending Physician: Madlyn Frankel  Referring Physician: PATIENT,   Consulting Physician:   Copied Physician(s):     [facility] is a not-for-profit corporation that was founded in 1997 as a   community effort to enhance the ability to share health information   electronically in the [employer] area. Today, [facility]   is one of the nation????????s largest and most financially sustainable regional   health information exchange (HIE) organizations.

## 2017-01-15 NOTE — Unmapped (Signed)
Patient: Wanda Rowe DOB: Apr 08, 1960 MRN: [id number]    CHIEF COMPLAINT  Chief Complaint  Patient presents with  ?????? Knee Pain    HPI  Wanda Rowe is a 56 y.o. female who presents with moderate aching left knee  pain exacerbated by movement and weight bearing.  TKR Dr. Jaynie Collins April, revision June.  Had fall last month, Xrays neg.  Had vascular duplex today showing fluid in joint but no DVT.  Is on Eliquis for  h/o DVT.  No fever, no redness, no warmth.  No other reported exacerbating or relieving factors or associated symptoms  reported.  She was sent in by Dr. Duanne Moron office staff because Dr. Jaynie Collins is out of town.      Study Result 01/15/17  PRELIMINARY IMPRESSIONS  1. There is no evidence of deep or superficial venous thrombosis in the   left lower extremity.  2. There is evidence of a non-vascular complex fluid collection with mixed   echogenicity in the left medial joint space 4.1 cm x 1.9 cm; clinical   correlation suggested.      DIAG-KNEE 3-VIEWS LT 12/21/16  Indication: pain, h/o TKR  COMPARISON: None  Findings: AP, lateral and sunrise views of the left knee were obtained. A  left total knee arthroplasty is noted. The major bony structures are  properly aligned. The hardware is intact without evidence for loosening.  There is no superimposed acute fracture or dislocation.  Impression: No acute osseous abnormality. Surgical changes of left total  knee arthroplasty, which is intact.        PAST MEDICAL HISTORY  Past Medical History:  Diagnosis Date  ?????? Anemia   in past  ?????? Anxiety  ?????? Arthritis   knees  ?????? Asthma  ?????? Awareness under anesthesia   woke up during endoscopy  ?????? Back problem   occ back ache  ?????? Cancer (CMS HCC)   right breast- chemo. and radiation in 2014 & 2015, hx of Lumpectomy  ?????? Chest pain   when under stress-testing done & no cause of chest pain was found  ?????? Complication of anesthesia   rxn to succinylcholine-day after surgery, pt  had difficulty w/ muscle  movement-dr j collins in  anesthesia dept notified- Informed Dr. Unknown Foley, AAC  for upcoming procedure on 07/07/16  ?????? Dysplasia  ?????? GERD (gastroesophageal reflux disease)   no meds  ?????? Hernia   hiatal-resolved with wt loss surg  ?????? Hypertension   in past & no longer taking medication  ?????? Irregular heart beat   MVP & heart murmur-used to take pre-dental antibiotics  ?????? Liver disorder   pt told she has a fatty liver-resolved per pt  ?????? Mobility impaired 07/01/2016   uses cane currently d/t recent s/p left knee replacement on 05/26/16  ?????? Occlusive thrombus 2017   dvts in past; IVC filter inserted 05/22/16  ?????? Osteopenia  ?????? RSD (reflex sympathetic dystrophy)  ?????? Suspected sleep apnea 07/01/2016   pt. states prior to knee replacement when she was given preop meds, she heard  the nurses calling her name and states that something had dropped suddenly: pt.  thinks it may have been her 02. She states she was advised to get a sleep study  in the future. pt. is not sure what med caused this to happen prior to  surgery  on 05/26/16.  ?????? Thyroid disorder   hypothyroidism  ?????? Venous thrombosis and embolism    SURGICAL HISTORY  Past Surgical History:  Procedure  Laterality Date  ?????? HX BREAST BIOPSY  july 2014   right  ?????? HX BREAST LUMPECTOMY  02-2013   right; also bilat reduction; back to surg next day for post op bleeding  ?????? HX COLPOSCOPY  ?????? HX FOOT SURGERY   right  ?????? HX GUM SURGERY   done for receding gums  ?????? HX HYSTERECTOMY, TOTAL ABDOMINAL  ?????? HX KNEE SURGERY   left x 3, right x 1  ?????? HX LAPAROSCOPY  ?????? HX OTHER SURGICAL HISTORY  jan 2014   gastric sleeve  ?????? HX OTHER SURGICAL HISTORY   ins. portacath  ?????? HX TOTAL KNEE ARTHROPLASTY Left 05/26/2016  ?????? HX UPPER GI ENDOSCOPY   mult in past due to stomach polyps  ?????? HX VAGINAL DELIVERY  ?????? HX VASCULAR SURGERY  05/22/2016   IVC filter  ?????? REMOVAL OF OVARY/TUBE(S)   bilateral salpingo-oophorectomy      CURRENT MEDICATIONS  Prior to Admission medications  Medication Sig Start  Date End Date Taking? Authorizing Provider  albuterol (PROVENTIL HFA) 90 mcg/actuation HFA Aerosol Inhaler Take 2 Puffs by  inhalation every 6 hours as needed.    Provider, Historical  apixaban (ELIQUIS) 5 mg Tablet Take 1 Tab by mouth 2 times daily for 150 days.  Take 1 tab my mouth 2 times daily for 5 months. 08/20/16 01/17/17  Copely,  Luci Bank., MD  CALCIUM CITRATE PO Take  by mouth daily.    Provider, Historical  CHOLECALCIFEROL, VITAMIN D3, PO Take  by mouth.    Provider, Historical  cyanocobalamin 100 mcg Tablet Take 100 mcg by mouth daily.   Yes Provider,  Historical  cyclobenzaprine (FLEXERIL) 10 mg tablet Take 10 mg by mouth 3 times daily as  needed for muscle spasm.    Provider, Historical  DULoxetine (CYMBALTA) 60 mg Capsule, Delayed Release(E.C.) Take 60 mg by mouth  daily. Reported on 07/24/2015    Provider, Historical  fluticasone (FLONASE) 50 mcg/Actuation NA nasal spray Spray 2 Sprays into nose  daily. Each nostril    Provider, Historical  gabapentin (NEURONTIN) 600 mg tablet Take 600 mg by mouth 2 times daily.   Yes  Provider, Historical  ipratropium (ATROVENT) 0.03 % NA nasal spray Spray 2 Sprays into nose 2 times  daily. Each nostril     Provider, Historical  lactobacillus rhamnosus (CULTURELLE) 10 billion cell Take 1 Cap by mouth daily.  Provider, Historical  levothyroxine (SYNTHROID) 75 mcg PO tablet Take 66 mcg by mouth daily.  Provider, Historical  oxyCODONE-acetaminophen (PERCOCET) 5-325 mg per tablet Take 1-2 Tabs by mouth  every 6 hours as needed for Pain.    Provider, Historical    ALLERGIES  Allergies  Allergen Reactions  ?????? Azithromycin    **DOES NOT WORK**    ?????? Succinylcholine Other (See Comments)    Delayed paralysis  Difficulty w/ muscle movement the day after surgery  Other reaction(s): Muscle Aches  Difficulty w/ muscle movement the day after surgery  ?????? Sulfasalazine Rash  ?????? Tegaderm Rash  ?????? Silver Rash  ?????? Sulfa (Sulfonamide Antibiotics) Swelling and Rash  ??????  Sulfanilamide Rash    Sulfonamides    FAMILY HISTORY  Family History  Problem Relation Age of Onset  ?????? High Blood Pressure Mother  ?????? Diabetes Father  ?????? High Blood Pressure Father  ?????? Heart Problems Paternal Grandfather       mi  ?????? Cancer Other       unspecified grandmother  ?????? Diabetes Other  unspecified grandmother  ?????? Stroke Other       unspecified grandmother  ?????? Colon Cancer Maternal Grandfather 60  ?????? Cancer Maternal Grandmother 60       Bladder  ?????? Anesthesia Complications Neg Hx      SOCIAL HISTORY  Social History    Social History  ?????? Marital status: Single    Spouse name: N/A  ?????? Number of children: N/A  ?????? Years of education: N/A    Social History Main Topics  ?????? Smoking status: Never Smoker  ?????? Smokeless tobacco: Never Used  ?????? Alcohol use Yes     Comment: very occasionally  ?????? Drug use: No  ?????? Sexual activity: Yes    Partners: Male    Other Topics Concern  ?????? Not on file    Social History Narrative  ?????? No narrative on file    REVIEW OF SYSTEMS  See HPI for further details  ALL OTHER REVIEW OF SYSTEMS OTHERWISE NEGATIVE    INITIAL PHYSICAL EXAM  Vital Signs: BP 134/71    Pulse 80    Temp 98.6 ??????F (37 ??????C) (Oral)    Resp 16     Ht 5' 8 (1.727 m)    Wt 190 lb (86.2 kg)    LMP 04/12/2008    SpO2 100%    BMI  28.89 kg/m??????  Constitutional: Well developed, well nourished, no distress, non-toxic  appearance  Eyes: PERRL, EOMI, Conjunctiva normal, sclera normal, no discharge  HENT: Head - atraumaitc, Ears - bilateral external ears normal, Nose - no  epistaxis, no deformity,  Oropharynx - mucous membranes moist, Neck - supple, no jugular venous  distention, no stridor  Respiratory: no respiratory distress, clear to auscultation bilaterally  Cardiovascular: normal heart rate, regular rhythm  GI: abdomen soft, nontender, nondistended  Musculoskeletal: left knee healed surgical scar, no redness, no warmth, slight  swelling, no obvious laxity, no focal tenderness, distal movement  sensation  perfusion intact  Neurologic: Alert, oriented to person place and time  Normal strength and sensation in all four extremities  Cranial nerves II-XII intact and symmetric  Integument: Warm, Dry, No erythema, no rash  Psychiatric: affect normal, judgement normal, mood normal        ED COURSE and MEDICAL DECISION MAKING:  Perftinent Labs & Imaging studies and patient Medical Records reviewed.  (see chart for details)  No redness, warmth or fever, duplex neg DVT - joint fluid, on Eliquis  Discussed with Dr. Rona Ravens covering for Dr. Jaynie Collins  Agree, no CT, Rx pain meds, will see Monday      IMPRESSION:  Final Diagnosis:  1. Acute pain of left knee      PLAN:  The patient was given the following medications to go home with:  New Prescriptions   OXYCODONE-ACETAMINOPHEN (PERCOCET) 5-325 MG PER TABLET    Take 1-2 Tabs by  mouth every 6 hours as needed for Pain for up to 5 days.      Follow up with:  Rutherford Guys, MD  [address]  [address]  [place] [address]  [phone]      Call for time - Dr. Rona Ravens will see you Monday      This was all discussed with the patient who voiced understnading of and  agreement with the plan and stated that all their questions had been answered  satisfactorily.    The patient was given written manamement instructions and educational material  regarding their condition as well as follow up  instructions and return  precautions.  The patient was informed that they would be contacted and given additional  instructions if there were any lab results or Xray reports that required follow  up.        MIPS:    Documentation of Current Medications (#130):  ?????? ([id number]) Reviewed      Screening For Hypertension and Follow-up (#317):  BP 134/71    Pulse 80    Temp 98.6 ??????F (37 ??????C) (Oral)    Resp 16    Ht 5' 8  (1.727 m)    Wt 190 lb (86.2 kg)    LMP 04/12/2008    SpO2 100%    BMI 28.89  kg/m??????    ?????? ([id number])Documented BP is in the pre-hypertensive or hypertensive  range-patient  was informed and  referred to primary care physician for evaluation    Screening For Tobacco Use and Cessation Intervention (#226):   reports that she has never smoked. She has never used smokeless tobacco.      Please note that some or all of this record was generated using voice  recognition software.  Some of the record may have also been typed entry.  If  there are any questions about the content of this document, please contact the  author as some errors of transcription may have occurred.      CHART ELECTRONICALLY SIGNED: Roselee Culver., MD 01/15/2017 4:29 PM

## 2017-01-15 NOTE — Unmapped (Signed)
This is a notification of an ED/Admission Alert generated by [facility].     This patient visited the following location: [facility] ([facility]  Admit Date: 01/15/2017 15:07  Visit Type: Emergency  Chief complaint: Knee pain   Diagnosis: Knee pain  ()  Alert Category:   Attending Physician:   Referring Physician:   Consulting Physician:   Copied Physician(s):     [facility] is a not-for-profit corporation that was founded in 1997 as a   community effort to enhance the ability to share health information   electronically in the [employer] area. Today, [facility]   is one of the nation????????s largest and most financially sustainable regional   health information exchange (HIE) organizations.

## 2017-01-18 NOTE — Unmapped (Signed)
Subjective  Subjective:  Patient ID: Wanda Rowe  Age: 56 y.o. (DOB: [birth year])  Ethnicity: Non-Hispanic  Race: White or Caucasian  Gender: female    Chief Complaint:  Chief Complaint  Patient presents with  ?????? Knee Pain    Left knee pain started on 01/15/17, seen in the ER, nothing was done, history  of LT TKA done on 05/26/16      Orthopedic History  Body Part: Knee  Side: Left  Symptoms Began: A Few Days Ago  Pain Scale (1-10): 9  Pain Description/Symptoms: Limping, Aching, Constant, Cramping, Popping,  Tightness, Limited ROM, Instability, Weakness (cramping pain down the front of  the knee, aching pain in the back of the knee)  Injury: No  Risk of falling: Yes  History of falling: Yes  Assistive Devices needed: Crutches  ADL Deficiencies: Standing, Walking, Going up/down stairs, Bending, Squatting,  Kneeling, Getting up from seated position, Cooking, Cleaning, Doing dishes  Prior Treatment: Icing, Elevating, Limitation of activities, Physical Therapy,  Pain relievers  Prior Treatments Failed: Yes  Greater than 66 years of age: No  Other History Comments: Takes percocet 5/325mg , presented to the ER where  nothing was done for her knee,    HPI: 56 year old female with left total knee replacement and status post left  knee manipulation in June 2018.  She was doing fairly well but went to the ER on  01/15/2017 for left knee swelling and pain.  Doppler was negative for DVT.  She  has been icing and elevating with slight improvement in pain.  She does not  recall any injury.  Denies fevers or chills.  Patient is on Eliquis for clotting  disorder.      Ortho Exam:  No acute distress, alert and oriented.  Skin is intact without erythema.  Surgical scar well-healed.  Moderate effusion.  Extensor mechanism intact.  Stable ligamentous exam.  Knee range of motion 0-90??????.  Antalgic gait.  Neurovascular intact distally.    ROS:          Review of Systems  Musculoskeletal: (-) Joint Pain, Knee Pain, Extremity Swelling, Leg  Pain  Constitutional: (-) Fever, (-) Chills, Weakness  Neurological: (-) Dizziness  GI: (-) Nausea, (-) Vomiting  HENT: (-) Headaches      Patient Vitals:          X-Ray/Injection In Clinic Procedure Notes    X-Ray/Inj Notes   Author Status Last Editor Updated Created   Rutherford Guys, MD Jamas Lav, MD 01/18/2017  5:45 PM  01/18/2017  5:45 PM       Assoc. Orders   DIAG-KNEE 3-VIEWS LT      Left knee x-rays demonstrate stable total knee components in satisfactory  position without evidence of loosening.  Metallic hardware in distal femur from  previous ACL reconstruction. No acute fractures, dislocations or destructive  lesions.        Joint Aspiration:  After discussion ofthe risks and benefits, the patient has elected to proceed  with aspiration of the  right/left knee. Written consent was signed and witnessed in the office today.  The skin was sterilized with betadine. Topical anesthesia was achieved with  ethylene chloride. A needle was inserted into the joint with a superolateral  approach and 0mL of a joint fluid was aspirated from the knee joint.The  aspiration was completed without complication and a bandage was applied.    The patient tolerated the procedure well and was instructed to avoid strenuous  activity for the  next 24-48 hours and use ice, NSAIDs or Tylenol for pain as  needed. There was no reaction noted in the office and the patient was instructed  to call immediately with any signs of infection or allergic reaction. The  patient will return as needed.        In Clinic Administered Meds Billing Data  None        Assessment/Impression:    Encounter Diagnoses  Code Name Primary?  ?????? M25.562 Acute pain of left knee Yes  ?????? Z47.1, Z61.096 Aftercare following left knee joint replacement surgery    Left total knee replacement, spontaneous hemarthrosis    Plan:    Orders Placed This Encounter  ?????? Xray Clinic - Knee 3 views AP, lateral, sunrise Left  [[id number]]  ?????? Referral to  Physical Therapy [facility])    Referral Priority:   Routine    Referral Type:   PT/OT/ST    Requested Specialty:   Physical Therapy    Number of Visits Requested:   46        I have reviewed the diagnosis and treatment options with the patient.  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  There is no evidence  of periprosthetic infection.  We discussed left knee aspiration to try and  alleviate some of the pressure and pain.  This was unsuccessful without any  fluid being aspirated.  She will continue icing and elevating to help the  swelling.  She was given a Medrol Dosepak.  She was also started with physical  therapy to work on range of motion.  We will see her back in 2 weeks to check  her progress.  She is going on vacation to the [place] soon after.

## 2017-02-01 NOTE — Unmapped (Signed)
Subjective  Subjective:  Patient ID: Wanda Rowe  Age: 56 y.o. (DOB: 02-06-1960)  Ethnicity: Non-Hispanic  Race: White or Caucasian  Gender: female    Chief Complaint:  Chief Complaint  Patient presents with  ?????? Follow-up    Left TKA 05-26-16 Left Knee Manipulation 07-07-16 L/S Dr Reece Agar  01-18-17          HPI: She is here today for follow-up on her left total knee replacement of May 26, 2016.  She had a manipulation on July 07, 2016.  She recently saw Dr.  Yehuda Mao on January 18, 2017 who gave her a Medrol Dosepak and told her  that she likely had a small hematoma.  She feels like she is getting better and  the steroids helped.      Ortho Exam: Left knee has no warmth, swelling, erythema or effusion.  Range of  motion is good and painless with no mechanical instability.  Ambulating well.  Alert and oriented, neurologically intact.      ROS:          Review of Systems  Musculoskeletal: Joint Pain, Knee Pain  Constitutional: (-) Fever, (-) Chills  Cardiovascular: Leg Swelling  Neurological: (-) Dizziness  Respiratory: (-) Cough, (-) SOB  GI: (-) Nausea, (-) Vomiting  Skin: (-) Rash  HEME: (-) Easily Bruises  Eyes: (-) Blurred Vision  HENT: (-) Headaches      Patient Vitals:  Vitals  Height: 5' 8 (172.7 cm)  Weight: 191 lb (86.6 kg)        X-Ray/Injection In Clinic Procedure Notes        In Clinic Administered Meds Billing Data  None        Assessment/Impression:    Encounter Diagnoses  Code Name Primary?  ?????? M25.562 Acute pain of left knee Yes  ?????? Z47.1, A54.098 Aftercare following left knee joint replacement surgery        Plan:  No orders of the defined types were placed in this encounter.    I examined her knee and advised that she is doing well.  I instructed her to  continue with pressure massage, ice and elevation as needed.  Continue with a  strengthening program.  Return at her one-year anniversary of surgery.  All  questions answered and understood.        I, [name], RMA am scribing for, and in  the presence of Dr. George Ina,  including xray interpretation if performed.    I, George Ina, MD personally performed the services described in the  documentation as scribed by [name], RMA in my presence, and confirm it  is both accurate and complete.    Please note that documentation was completed using dragon speak and some errors  may have occurred and have not been identified.  Please check the chart for  addendums.  If there are major errors that you have concern about please contact  the office for clarification.

## 2017-02-04 ENCOUNTER — Ambulatory Visit: Admit: 2017-02-04 | Discharge: 2017-02-04 | Payer: PRIVATE HEALTH INSURANCE | Attending: Family

## 2017-02-04 DIAGNOSIS — G4733 Obstructive sleep apnea (adult) (pediatric): Secondary | ICD-10-CM

## 2017-02-04 NOTE — Unmapped (Signed)
Chief Complaint: OSa on CPAP new set up    Referral made by: Dr. Rico Junker, [place] .     Patient name: Wanda Rowe   Date of birth: [birth year]  Referring provider: Lynelle Doctor, CNP  Date of study: 10.24.18  Impressions:  1. Obstructive sleep apnea.  AHI 18 per hour.  a. Moderate frequency of obstructive respiratory events.  b. Mild to occasionally severe obstructive respiratory event associated oxyhemoglobin desaturations.  c. Minimal density of moderate or severe oxyhemoglobin desaturation events.  2. Snorer.  3. Mostly supine observation on this study.        Methodology: A Respironics Night 1 recording device was utilized. Recording parameters included airflow, thorax respiratory effort, continuous oxyhemoglobin saturation, heart rate, body position and patient event marker channels. Please see the technical report.  Data interpretation:    Technical quality was adequate for interpretation.  Total monitoring time was 489 minutes which included 435 minutes in the supine position.  Obstructive respiratory events were present.  There were 149 obstructive apneas and hypopneas.  The AHI was 18 per hour.  Obstructive respiratory events occurring in both the supine and nonsupine positions.  Mild to occasionally severe obstructive respiratory event associated oxyhemoglobin desaturations.  Snoring was present.  Baseline oxyhemoglobin saturation was normal at 97 percent.  The oxyhemoglobin saturation nadir for this study was 79 percent.  No cardiac dysrhythmias were identified.                 Georgian Co, M.D.    HPI: Wanda Rowe is a 57 y.o. female who started CPAP. She is trying to get used to using CPAP. Initially used the FF (thinks dreamwear) now using the swift FX like that better. She likes her pressure. She has no rain out or dry mouth. She has  A large leak thinks that is when she had the FF mask did not fit in her nose right and kept leaking.   No snoring, excessive daytime sleepiness and no drowsy driving.   She  thinks she can tell when she wakes up her sleep has been better.     Hematoma/ clot in knee  Was on a medrol dose pack now finished.     Set up date 01/05/2017  Compliance Data Download: 01/05/2017-01/27/2017  Device Model: RESMED S10  Mask: swift fx med  PAP Pressure: min 5 max13 cm H2O average pressure 11.7 cmH2O  Percent Days with Device Usage: 91 %  Percent of Days with usage >= 4 hours: 57 %  Average Usage (number of hours used all days): 4 hrs 11 min  AHI: 4.3  Leak average 34 ma 58    Initial hx of anesthesiologist told her she has sleep apnea and needs to be evaluated and she has observed apnea, loud snoring, and snorting. She complains of excessive daytime sleepiness and naps for about 1-2 hrs.  She has no problems falling asleep but is a restless sleeper and wakes up about 1-2 x per night. She wakes up un-refreshed.     Driving while drowsy:  Denies veering to the side of the road,accidents, or near-accidents due to sleepiness while driving    Sleep Schedule  Circadian rhythm disorder due to shift work No  Use sleeping aids No  Thinks that interfere with sleep  No Heat, No cold, Yes light, No noise, No partner, No kids, Yes pets, No reflux,   No nocturia,  No leg movements, No night sweats, No pain interferes with sleep  Bedtime is 10-11p  on weekdays and same  on weekends. Falls asleep within 10 minutes, with 3-4 nighttime awakenings due too check phone and let the dog put. Falls back asleep in few minutes. . Awakens at 730 am during the week and 9 am on the weekends.     bed partner pets  Patient thinks they get approximately 7-8 hours of sleep a night.    Nap 1 x per week for 1-2 hrs not using her CPAP  Patient's desired sleep schedule would be 10p-8a  Pt lives with dogs  Occupation attorney  Divorced 1 child    Sleep Habits    No drink caffeine 6 hours before bedtime   No exercise before bedtime    Yes Watch TV before falling asleep,    Yes use electronic devices before falling asleep or during the  night    Unusual sleep activity:   Yes does  remember her dreams. Yes  vivid dreams. Yes chase type dreams.Yes  nightmares. No dream enactment.   No cataplexy,   No sleep paralysis   No hypnagogic or hypnopompic hallucinations   No enuresis    Yes sleep talking    No sleep walking    No sleep eating   Yes bruxism uses a mouth gard  Yes TMJ uses a mouth guard no adjusting   No RLS   Yes kicking while asleep.      No closed head injuries   No Seizures    No clastrophobia  Tonsils have been removed No  Deviated septum  No  Problems with nasal congestion Yes  palpitations yes  Night sweats no  Decreased sex drive no  Dentures no good condition'  Anxiety- yes      EPWORTH SLEEPINESS SCALE: (at or above 10 is abnormal and 24 is the maximum score)  Epworth Sleepiness Scale 12/10/2016 02/04/2017   Sitting and reading 1 1   Watching TV 1 2   Sitting, inactive in a public place (e.g. a theatre or a meeting) 1 1   As a passenger in a car for an hour without a break 1 1   Lying down to rest in the afternoon when circumstances permit 3 3   Sitting and talking to someone 0 0   Sitting quietly after a lunch without alcohol 0 0   In a car, while stopped for a few minutes in traffic 0 0   Total score 7 8       .Previous Report(s) Reviewed: historical medical records, lab reports and office notes   The following portions of the patient's history were reviewed and updated as appropriate: allergies, current medications, past family history, past medical history, past social history, past surgical history and problem list.    SOCIAL HX:  Social History     Social History   ??? Marital status: Unknown     Spouse name: N/A   ??? Number of children: N/A   ??? 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 Smoking status: Never Smoker   ??? Smokeless tobacco: Never Used      Comment: (06/23/2006)   ??? Alcohol use Yes      Comment: Occasionally (06/23/2006)   ??? Drug use: No   ??? Sexual activity: Not  Asked     Other Topics Concern   ??? Caffeine Use Yes     rarely   ???  Occupational Exposure No   ??? Exercise Yes   ??? Seat Belt Yes     Social History Narrative   ??? None        Past Medical Hx:   Past Medical History:   Diagnosis Date   ??? Anemia    ??? Anxiety    ??? Asthma     Mild   ??? Breast cancer (CMS Dx)       Right Breast   ??? Diabetes mellitus (CMS Dx)     Type 2   resolved with gastric sleeve   ??? DVT (deep vein thrombosis) in pregnancy (CMS Dx)    ??? GERD (gastroesophageal reflux disease)    ??? Hyperlipidemia    ??? Hypertension    ??? Hypothyroidism    ??? Mitral valve prolapse    ??? Polyp of stomach    ??? RSD (reflex sympathetic dystrophy)     R knee     SurgHx:   Past Surgical History:   Procedure Laterality Date   ??? ANTERIOR CRUCIATE LIGAMENT REPAIR      L    ??? birth mark      benign on buttocks deep   ??? BREAST BIOPSY Left    ??? BREAST SURGERY Right     Lumpectomy chemo and radiation   ??? COLONOSCOPY     ??? COLONOSCOPY W/ POLYPECTOMY      benign   ??? COLPOSCOPY     ??? DENTAL SURGERY      Periodontal    ??? ESOPHAGOGASTRODUODENOSCOPY N/A 07/19/2015    Procedure: ESOPHAGOGASTRODUODENOSCOPY WITH COLONOSCOPY WITH MAC;  Surgeon: Lurene Shadow, MD;  Location: [facility] ENDOSCOPY;  Service: Gastroenterology;  Laterality: N/A;   ??? FOOT SURGERY     ??? gastric sleeve     ??? HYSTERECTOMY     ??? IR IVC FILTER PLACE  05/22/2016   ??? JOINT REPLACEMENT Left     Knee   ??? KNEE SURGERY  05/2016    Left Knee Replacement   ??? MENISCECTOMY      L x 2, R x 1   ??? TYMPANOSTOMY TUBE PLACEMENT     ??? WISDOM TOOTH EXTRACTION       CURRENT MEDICATIONS:   Current Outpatient Prescriptions   Medication Sig   ??? albuterol Inhale 1-2 puffs into the lungs every 6 hours as needed.   ??? apixaban Take 5 mg by mouth 2 times a day.   ??? CALCIUM CITRATE ORAL Take 1 tablet by mouth daily.     ??? cholecalciferol (vitamin D3) Take 1 capsule by mouth daily.   ??? DULoxetine Take 1 capsule (60 mg total) by mouth daily.   ??? fluticasone Use 2 sprays into each nostril daily.   ??? gabapentin  Take 1 tablet (600 mg total) by mouth 3 times a day.   ??? ipratropium Use 2 sprays into each nostril 2 times a day.   ??? LACTOBACILLUS ACIDOPHILUS (PROBIOTIC ORAL) Take 1 capsule by mouth daily.   ??? levothyroxine Take 1 tablet (50 mcg total) by mouth every morning before breakfast.   ??? vit b complex w-b 12 Take 1 tablet by mouth daily.   ??? blood sugar diagnostic fsbs qd   ??? blood-glucose meter by Other route 2 (two) times daily. Use as instructed - fsbs bid    ??? UNABLE TO FIND Med Name: thorne ferrasorb     No current facility-administered medications for this visit.       Outpatient Medications Prior to Visit  Medication Sig Dispense Refill   ??? albuterol (PROVENTIL;VENTOLIN;PROAIR) 90 mcg/actuation inhaler Inhale 1-2 puffs into the lungs every 6 hours as needed. 1 Inhaler 3   ??? CALCIUM CITRATE ORAL Take 1 tablet by mouth daily.       ??? cholecalciferol, vitamin D3, 2,000 unit Cap Take 1 capsule by mouth daily. 1 capsule 0   ??? DULoxetine (CYMBALTA) 60 MG capsule Take 1 capsule (60 mg total) by mouth daily. 30 capsule 5   ??? fluticasone (FLONASE) 50 mcg/actuation nasal spray Use 2 sprays into each nostril daily. 16 g 5   ??? gabapentin (NEURONTIN) 600 MG tablet Take 1 tablet (600 mg total) by mouth 3 times a day. 270 tablet 5   ??? ipratropium (ATROVENT) 0.03 % nasal spray Use 2 sprays into each nostril 2 times a day. 30 mL 5   ??? LACTOBACILLUS ACIDOPHILUS (PROBIOTIC ORAL) Take 1 capsule by mouth daily.     ??? levothyroxine (SYNTHROID, LEVOTHROID) 50 MCG tablet Take 1 tablet (50 mcg total) by mouth every morning before breakfast. 30 tablet 5   ??? vit b complex w-b 12 tablet Take 1 tablet by mouth daily. 30 tablet 0   ??? blood sugar diagnostic (ACCU-CHEK AVIVA PLUS TEST STRP) Strp fsbs qd 100 strip 1   ??? blood-glucose meter (GLUCOSE MONITORING KIT) kit by Other route 2 (two) times daily. Use as instructed - fsbs bid      ??? UNABLE TO FIND Med Name: thorne ferrasorb 1 capsule 0     No facility-administered medications prior to  visit.      Allergies as of 02/04/2017 - Fully Reviewed 02/04/2017   Allergen Reaction Noted   ??? Succinylcholine Hives 01/24/2013   ??? Zithromax [azithromycin]     ??? Silver Rash 01/24/2013   ??? Sulfa (sulfonamide antibiotics) Swelling and Rash    ??? Sulfanilamide Rash 01/24/2013     Family Hx:   Family History   Problem Relation Age of Onset   ??? Hypertension Mother    ??? Other Mother         DDD   ??? Diabetes Father         Type 2   ??? Hypertension Father    ??? Factor VIII deficiency Father    ??? Other Brother         Back Pain   ??? Diabetes Maternal Grandmother         Type 2   ??? Cancer Maternal Grandmother         Bladder CA   ??? Stroke Maternal Grandmother    ??? Colon Cancer Maternal Grandfather    ??? Breast Cancer Paternal Grandmother        Review of Systems  General ROS: negative  Ophthalmic ROS: positive for - uses glasses dry eyes  Ears, nose, mouth, throat, and face ROS: negative  Respiratory ROS: negative  Cardiovascular ROS: negative  Gastrointestinal ROS: negative  Genitourinary NWG:NFAOZHYQ for nocturia 1 x per night  Integument/breast ROS: negative  Hematologic/lymphatic ROS: negative  Musculoskeletal MVH:QIONGEXB for arthralgias, neck pain and stiff joints  Neurological ROS: negative  Psychological ROS: negative  Endocrine ROS: all  negative  Allergy and Immunology ROS: positive for - see list      Denies: suicidal ideas, irritability, depression, racing mind, changes in appetite, fainting spells, alcoholism, tremors, using illegal drugs, worrying about inability to sleep, insomnia, shortness of breath,  lower extremity edema,    I have reviewed the review of system and advised patient to  contact primary care physician or specialist for non sleep related issues.    ROS  has been reviewed and updated with the patient during this visit.     Objective:     Wt Readings from Last 3 Encounters:   02/04/17 192 lb 6.4 oz (87.3 kg)   01/14/17 190 lb (86.2 kg)   01/06/17 191 lb 12.8 oz (87 kg)     BP 126/72    Pulse 75     Resp 16    Ht 5' 7.01 (1.702 m)    Wt 192 lb 6.4 oz (87.3 kg)    SpO2 99%    BMI 30.13 kg/m??  personally reviewed    Physical Exam   Constitutional:  alert, and in no apparent distress. Well-developed and well-nourished.   Head: normocephalic and atraumatic.   Ears: appear normal externally bilaterally  Eyes: Conjunctivae are normal. Pupils are equal, round, no scleral icterus, drainage or redness.   Nose: Columella normal.  Negative for nasal congestion. No deviated septum.   Mouth : Mallampati 3. Crowded airway. Oropharynx is clear, moist and pink. Uvula thick and elongated,  Midline. Tonsils unable to visualize. Tongue normal. Dentition good.  Neck: Supple, Grossly normal range of motion. . Trachea midline.    Cardiovascular: normal rate and regular rhythm. Neg murmur.    Pulmonary/Chest: effort normal and breath sounds normal. No stridor. No respiratory distress, no wheezes, no rales.   Abdominal: deferred   Musculoskeletal: Grossly normal range of motion and strength. Normal gait.    Neurological:  alert. Cranial nerves II-XII grossly intact. Strength 5/5 in upper  extremities bilaterally.  No tremor.   Skin: skin is warm and dry. No rash noted.   Psychiatric: normal speech, normal mood and affect, behavior is normal. Judgment and thought content normal.     Labs Reviewed:   Lab Results   Component Value Date    IRON 109 01/05/2017    TIBC 365 04/28/2008    FERRITIN 159 (H) 01/05/2017     Lab Results   Component Value Date    WBC 5.0 01/05/2017    HGB 12.1 01/05/2017    HCT 35.4 01/05/2017    MCV 88 01/05/2017    PLT 283 01/05/2017     Lab Results   Component Value Date    GLUCOSE 98 01/05/2017    BUN 19 01/05/2017    CO2 24 01/05/2017    CREATININE 0.73 01/05/2017    K 4.3 01/05/2017    NA 141 01/05/2017    CL 101 01/05/2017    CALCIUM 9.9 01/05/2017     Lab Results   Component Value Date    HGBA1C 4.9 09/18/2016     Lab Results   Component Value Date    TSH 1.990 01/05/2017    T3TOTAL 93 01/05/2017    T3FREE  2.5 01/18/2014    FREET4 0.97 07/01/2016    THYROIDAB 1 11/10/2013         Assessment     Assessment:    OSA on CPAP. She is trying to get used to using CPAP. She feels this is much better then the first time. Initially used the FF (thinks dreamwear) now using the swift FX like that better. She likes her pressure. She has no rain out or dry mouth. She has  A large leak thinks that is when she had the FF mask did not fit in her nose right and kept leaking.   No snoring, excessive daytime sleepiness and  no drowsy driving.   She thinks she can tell when she wakes up her sleep has been better.       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    Body mass index is 30.13 kg/m??.     Breast cancer s/p breast surgery chemo and radiation, Anxiety, nerve disorder, mitral valve prolaps, gastric sleeve, GERD, IBS, anemia, hx DVT has filter (blood clots)   Plan:   1. OSA  Reviewed download with pt  Answered questions- travel, humidification, cleaning instructions  Increased the EPR 3 was off  Call to get supplies    Ordered auto CPAP min 5 max 13 heated hose and mask    DME [facility]    Reviewed DME, CPAP, mask policy, humidification, RAMP, heated tubing, cleaning instructions, and when he can get supplies. Talked about we have 90 days to meet compliance required by the insurance companies. They require you using the device more than 4 hours 70 %. Within the first 90 days or the DME will take away the device per some insurance company rules.  Some insurance companies also require a office visit between day 31 and 90 day. Handouts given. Instructed pt to review before going to the DME, so you know what questions to ask, and make sure the respiratory therapist shows you how to use the features of the device.      2. Patient was educated regarding avoiding driving while  sleepy, specifically avoiding long distance or night driving pending successful treatment of this condition. Guidelines for tips to avoid drowsy driving were provided. Pt aware this is their responsibility not to drive drowsy and find another form of transportation.     3. Encouraged weight loss and exercise    Follow up appointment recommended for 2 months           SLEEP QUALITY MEASURES:   Obstructive Sleep Apnea Follow Up Visit:   Adherence to therapy documented at least annually?: Yes    Reassessment of excessive daytime sleepiness?: Yes    Weight / body mass index assessment at visit?: Yes    If body mass index greater than 25 was discussion of weight management discussed? : Yes    Blood pressure assessed at visit? : Yes          Medical Decision Making:   The following items were considered in medical decision making:   Review / order other diagnostic tests/interventions   Risks & Benefits:   Risks, benefits and treatment options discussed with patient.

## 2017-02-04 NOTE — Unmapped (Signed)
Key points to remember  ?? You should wear the device every time you sleep, no matter when you sleep or where you sleep. When you nap or travel or go on vacation you need to use the machine when asleep.   ?? Bring your device, mask, hose, power cord etc to every office visit.   ?? If you sleep without the device your Obstructive Sleep Apnea (OSA) is untreated and your risk of heart attacks and strokes are higher.   ?? Clean the device on a regularly basis to avoid sinus infections and pneumonias   ?? Never travel with water in the reservoir   ?? Replace supplies on a regularly basis (see schedule below). You may need to contact your Durable Medical Equipment company (DME) and request supplies (just like you would medications from a pharmacy).  ?? Alcohol will worsen OSA, Alcohol can decrease your drive to breathe, slowing your breathing and making your breaths shallow. In addition, it may relax the muscles of your throat, which may make it more likely for your upper airway to collapse. REMEMBER TO ALWAYS WEAR YOUR PAP DEVICE WHEN SLEEPING.   ?? Certain medications can also worsen OSA such as some pain medications, anti-anxiety medications, muscle relaxants, testosterone supplementation. Please inform your sleep provider which medications you take on a regular basis.   ?? All patients are advised to make follow up appointments with the provider after they are set up with their PAP device. Most insurance plans now require the patient set up a follow up appointment with the provider 31-90 days after they have been set up with their device and at least once a year thereafter. Discuss these issues with your provider for more details.   ?? Please note when you are provided with a mask you have a manufacturer 30 day guarantee that comes with it. If you do not like that mask for any reason contact your DME and request an exchange. After 30 days you may not be eligible for a new mask for another 3-6 months depending on insurance. Talk  to your DME or sleep provider for more details.   ?? If you are struggling with nasal congestion try to use a nasal sinus rinse available over the counter such as Netti Pot 1 hour before bedtime (you may have to use it daily for a 7-10 days then as needed). You can also try over the counter Flonase or Nasacort 1-2 sprays in each nostril at bedtime.   ?? If you ever have problems with the device or the plan discussed with your sleep provider please contact your provider by phone or using MyChart. Thank you      CLEANING INSTRUCTIONS FOR PAP EQUIPMENT     Keeping your equipment and supplies clean is very important.     REMINDER: Only use DISTILLED WATER in your humidifier, Empty water daily.    DAILY  Mask and tubing:   ?? Wash your face before applying mask  ?? Wash mask and tubing with baby shampoo and warm water.     Humidifier:   ?? Empty water in reservoir  ?? Clean with baby shampoo and warm water  ?? Rinse, then air dry    WEEKLY  Mask and tubing:   ?? Soak your mask and tubing in 1 part vinegar and 3 parts water for 30 minutes. Rinse, and allow to air dry.     Humidifier:   ?? Wash with warm water and baby shampoo  ?? Soak in   1 part vinegar and 3 parts water for 30 minutes  ?? Rinse with warm water and allow to air dry    Machine Exterior:   ?? Wipe with a clean damp cloth    MONTHLY AND/OR AS NEEDED  ?? Reusable foam filters (black filter)- wash in warm water with baby shampoo. Rinse well and dry with paper towel  ?? Disposable felt filter (white filter)- Replace filter every two weeks to once a month    NOTE: If you are having repeated sinus and /or respiratory infections, dirty equipment may be the cause. It may help to clean and disinfect your equipment more frequently    TRAVELING  Always make sure the humidifier is empty when traveling. (including doctor appointments, air travel or long distance driving).  When flying, always carry your PAP device with you as a carry on item, NEVER check it in as baggage. We can  provide you with a letter stating it is a medical device, talk to your provider.    Always make sure you have your mask and tubing with you. You will need appropriate plug adapters when traveling outside the [place].    If travelling by air and you are unable to carry distilled water with you use bottled water (no more than 2 weeks). DO NOT USE TAP WATER.       Equipment Replacement Schedule    To get the most benefit from your PAP therapy, your equipment should be replaced when necessary based on wear and tear. For example, your mask may need to be replaced if you notice it is cracked or the seal is leaking. If your tubing is torn, it needs to be replaced.   If your equipment is showing signs of wear, you may be entitled to replace it. The replacement schedule for Medicare patients is shown below. If you are NOT a Medicare patient, please check with your DME (Durable Medical Supply) provider for your individual insurance policy???s replacement schedule.     Supplies   Medicare Medicaid My Insurance Plan  Mask    1 per 3 m 1 per year _______________  Nasal cushion  2 per m 1 per 6 m _______________  Pillows cushion  2 per m 1per 6 m _______________  Full-face cushion  1 per m 1per 6 m _______________  Headgear   1 per 6 m 1 per year _______________  Water Chamber  1 per 6 m N/A  _______________  Chinstrap   1 per 6 m 1 per 6 m       _______________  Tubing/Hose   1 per 3 m 1 per year _______________  Heater wire tubing  1 per 3 m N/A  _______________  Filter, disposable (white) 2 per m 1 per m _______________  Filter, particle foam (black) 1 per 6 m 4 per year _______________  Therapy device  1 per 5 yrs  ?  _______________    Follow up and compliance goals for Medicare and Medicaid patients   (most private insurances are now following similar rules)  Medicare and Medicaid require patients to follow-up with their provider after they are set up with their PAP equipment. The follow up appointment should be within  31-90 days after receiving their equipment. If patient does not follow up as directed there might be issues with the cost of the device being covered by the insurance. Therefore please ensure you make a follow up appointment with your sleep provider.      Insurance   also requires that the patient meet some compliance goals such as the download should indicate patient has been wearing the device for at least 4 hours per night AND they are using the device for 30 nights in a row.      HUMIDIFICATION feature on your PAP device. Most machines are pre-set for a humidity setting of 3 or 4. You can manually change the humidifier settings on your machine when you turn the machine on. Increase the humidity level one increment at a time.  Respironics device: Turn the on/off button to the left for less humidification or the right for more humidification. Humidification ranges from 1-5. If you have a heated hose talk to your provider or DME who can teach you how to make adjustments.     F&P device: Turn the large dial to the right for higher level and left for lower level. Humidification ranges from 1-7.     ResMed S9 device: Attach the humidifier component to the main unit, turn   the dial and choose the water droplet icon; press the button once. Then turn the dial to adjust your humidity setting. Press button a final time to save the setting. Humidification ranges from 1-8        Resmed S10 Device - This device has an Auto humidification feature.    If you have a heated hose it defaults to Auto Setting. You can change it to           manual setting under My Options. Switch Climate Control from Auto to            Manual. Then you will see two other options pop up. Tube Temperature and Humidity Level. Tube temperature is like your thermostat how hot or cold you want the air to be. It can range from 60-86 degree (most defaults to 81 degrees). Humidity Level is the next option this is the Moisture that is captured from the  distilled water placed in the chamber. It goes from 1-8. Higher the # more moister. SEE THIS VIDEO https://www.youtube.com/watch?v=[email or web address]&sns=em         For example, if you are waking up with a dry mouth, you should turn your humidifier up one number at a time. If you are getting excess condensation (water droplets) in your mask or tubing, you should turn the humidifier down one number at a time. In summer you need less humidity in winter you need more humidity. THIS NUMBER RARELY STAYS THE SAME ALL YEAR LONG. YOU HAVE TO CHANGE IT DEPENDING ON HOW YOU FEEL AND WHAT THE WEATHER IS OUTSIDE.     A prescription will be sent to a DME (medical supply company) covered by your insurance. The DME Company should contact you within 7-10 business days once they receive the prescription. If you have not received a call from the DME Company regarding your device, PLEASE CALL US so we may investigate further.   DROWSY DRIVING TIPS    These suggestions will help prevent you from the risk of drowsy driving.     1. If you feel tired or drowsy don't drive. Sleepiness is a major cause of motor vehicle accidents and accounts for 40% of all fatal crashes reported on the [place] thru[place]. No matter how much you think you can control sleepiness, you can't.     2. Ensure you follow your doctor's advice about the treatment for your sleep disorder. For example, if you have sleep apnea and use CPAP, ensure you use it   fully the night before your trip.     3. Get a good night's sleep before driving. Do not cut yourself short of sleep if you plan a long drive the next day. Get to bed early and do not stay up late packing.     4. Avoid alcohol both the night before your trip and during your trip. Alcohol will disrupt sleep and make you more tired the next day. Sleepiness and alcohol are additive in increasing impairment of your driving ability.     5. Avoid any sedative medications, including sedative antihistamines that are often contained in  cold or allergy medications, the night before you drive as they may have long lasting effects the next day.     6. Travel during non-sleeping hours. Accidents due to sleepiness are more common during the nighttime hours.     7. If sleepy, stop and rest. Drink coffee and walk around. Take a brief nap, lock your car doors and take a nap in your car if you are sleepy. Have a 10-15 minute break after every 2 hours of driving.     8. Drive with a companion. Share the driving. Relax in the back seat until it is your time to share the driving again.     These are only guidelines; please discuss your diagnosis and treatment with your sleep medicine provider. Drive safely.       Sleep Hygiene     Sleep disruption is common, especially during times when you may feel emotionally overwhelmed. Anxieties, relentless replay of the day???s events and heightened emotions may significantly interfere with your sleep. Lack of sleep robs you of needed rest, making management of your illness more difficult.   Bring sleep patterns under control and working at a consistent, stable pattern is very important to illness management. You need your rest. The most common cause of insomnia is a change in your daily routine. For example traveling, change in work hours, disruption of other behaviors (eating, exercise, leisure, etc.), relationship conflicts may cause sleep problems. Paying attention to good sleep hygiene is the most important thing you can do to maintain good sleep.     Do:   1. Go to bed at the same time each day.   2. Get up from bed at the same time each day.   3. Get regular exercise each day, preferably in the morning. There is good evidence that regular exercise improves restful sleep. This includes stretching and aerobic exercise.   4. Get regular exposure to outdoor or bright lights, especially in the late afternoon.   5. Keep the temperature in your bedroom comfortable.   6. Keep the bedroom quiet when sleeping   7. Keep the  bedroom dark enough to facilitate sleep.   8. Use your bed only for sleep and sex.   9. Take medications as directed. It???s often helpful to take prescribed sleeping pills one hour before bedtime, so they are causing drowsiness when you lie down, or 10 hours before getting up to avoid daytime drowsiness.   10. Use a relaxation exercise just before going to sleep, (muscle relaxation, imagery, massage, breathing exercises, warm baths).   11. Keep your feet and hands warm. Wear warm socks and/or mittens or gloves to bed.     Don???t:   1. Exercise just before going to bed.   2. Engage in stimulating activity just before bed, such as playing a competitive game, watching an exciting program on TV or movie, or having   an important discussion with a loved one.   3. Have caffeine in the evening (coffee, many teas, chocolate, sodas, etc). No caffeine after noon.    4. Read or watch TV in bed.   5. Use alcohol to help you sleep.   6. Go to bed too hungry or too full.   7. Take another person???s sleeping pills.   8. Take over-the-counter sleeping pills without your doctor???s knowledge. Tolerance can develop rapidly with these medications. Diphenhydramine (an ingredient commonly found in over-the-counter sleep medications) can have serious side effects for elderly patients.   9. Take daytime naps.   10. Command yourself to go to sleep. This only makes your mind and body more alert.   11. Do not look at the time when you wake up in the middle of the night. Remove the clocks from the bedroom or set your alarm for the morning and have it face the wall.   If you lie in bed awake for more than 20-30 minutes, get up, go to a different room (or different part of the bedroom), and participate in a quiet activity (non-excitable reading or listen to soothing music) then return to bed when you feel sleepy. Do this as many times during the night as needed.

## 2017-02-19 NOTE — Unmapped (Signed)
Rhonda please call her   Reviewed download- leak is better  AHI 2.4   Pressure min 5 max 13 average 10.9  Leak average 14.8  Mask swift fx med   4/9 follow up

## 2017-02-23 NOTE — Telephone Encounter (Signed)
LMOVM with message, gave my direct phone number if she has questions or concerns.

## 2017-02-25 DIAGNOSIS — R809 Proteinuria, unspecified: Secondary | ICD-10-CM | POA: Insufficient documentation

## 2017-02-25 DIAGNOSIS — R3989 Other symptoms and signs involving the genitourinary system: Secondary | ICD-10-CM | POA: Insufficient documentation

## 2017-02-25 NOTE — Unmapped (Signed)
Subjective  Subjective:    Patient ID: Wanda Rowe  Age: 57 y.o. (DOB: 01-24-1961)  Ethnicity: Non-Hispanic  Race: White or Caucasian  Gender: female    Chief Complaint:  Chief Complaint  Patient presents with  ?????? Urinary Tract Infection    UTI - bladder pressure.      HPI    Location:  Bladder and lower pelvis.  Signs/Symptoms: pelvic pain and pelvic pressure and recurrent uti sx  Severity:  2  Quality:  worsening  Duration:  mos  Prior Treatment: atb    Past Medical History:  Diagnosis Date  ?????? Anemia   in past  ?????? Anxiety  ?????? Arthritis   knees  ?????? Asthma  ?????? Awareness under anesthesia   woke up during endoscopy  ?????? Back problem   occ back ache  ?????? Cancer (CMS HCC)   right breast- chemo. and radiation in 2014 & 2015, hx of Lumpectomy  ?????? Chest pain   when under stress-testing done & no cause of chest pain was found  ?????? Complication of anesthesia   rxn to succinylcholine-day after surgery, pt  had difficulty w/ muscle  movement-dr j collins in anesthesia dept notified- Informed Dr. Unknown Foley, [facility]  for upcoming procedure on 07/07/16  ?????? Dysplasia  ?????? GERD (gastroesophageal reflux disease)   no meds  ?????? Hernia   hiatal-resolved with wt loss surg  ?????? Hypertension   in past & no longer taking medication  ?????? Irregular heart beat   MVP & heart murmur-used to take pre-dental antibiotics  ?????? Liver disorder   pt told she has a fatty liver-resolved per pt  ?????? Mobility impaired 07/01/2016   uses cane currently d/t recent s/p left knee replacement on 05/26/16  ?????? Occlusive thrombus 2017   dvts in past; IVC filter inserted 05/22/16  ?????? Osteopenia  ?????? RSD (reflex sympathetic dystrophy)  ?????? Suspected sleep apnea 07/01/2016   pt. states prior to knee replacement when she was given preop meds, she heard  the nurses calling her name and states that something had dropped suddenly: pt.  thinks it may have been her 02. She states she was advised to get a sleep study  in the future. pt. is not sure what med caused  this to happen prior to  surgery  on 05/26/16.  ?????? Thyroid disorder   hypothyroidism  ?????? Venous thrombosis and embolism    OB History  Gravida Para Term Preterm AB Living  1 1       1   SAB TAB Ectopic Multiple Live Births      # Outcome Date GA Lbr Len/2nd Weight Sex Delivery Anes PTL Lv  1 Para      Past Surgical History:  Procedure Laterality Date  ?????? HX BREAST BIOPSY  july 2014   right  ?????? HX BREAST LUMPECTOMY  02-2013   right; also bilat reduction; back to surg next day for post op bleeding  ?????? HX COLPOSCOPY  ?????? HX FOOT SURGERY   right  ?????? HX GUM SURGERY   done for receding gums  ?????? HX HYSTERECTOMY, TOTAL ABDOMINAL  ?????? HX KNEE SURGERY   left x 3, right x 1  ?????? HX LAPAROSCOPY  ?????? HX OTHER SURGICAL HISTORY  jan 2014   gastric sleeve  ?????? HX OTHER SURGICAL HISTORY   ins. portacath  ?????? HX TOTAL KNEE ARTHROPLASTY Left 05/26/2016  ?????? HX UPPER GI ENDOSCOPY   mult in past due to stomach polyps  ?????? HX VAGINAL  DELIVERY  ?????? HX VASCULAR SURGERY  05/22/2016   IVC filter  ?????? REMOVAL OF OVARY/TUBE(S)   bilateral salpingo-oophorectomy    Social History  Substance Use Topics  ?????? Smoking status: Never Smoker  ?????? Smokeless tobacco: Never Used  ?????? Alcohol use Yes     Comment: very occasionally    History  Sexual Activity  ?????? Sexual activity: Yes  ?????? Partners: Male    Social History    Social History Narrative  ?????? No narrative on file    Family History  Problem Relation Age of Onset  ?????? High Blood Pressure Mother  ?????? Diabetes Father  ?????? High Blood Pressure Father  ?????? Heart Problems Paternal Grandfather       mi  ?????? Cancer Other       unspecified grandmother  ?????? Diabetes Other       unspecified grandmother  ?????? Stroke Other       unspecified grandmother  ?????? Colon Cancer Maternal Grandfather 60  ?????? Cancer Maternal Grandmother 60       Bladder  ?????? Anesthesia Complications Neg Hx      Allergy:  Allergies  Allergen Reactions  ?????? Azithromycin    **DOES NOT WORK**    ?????? Succinylcholine Other (See Comments)     Delayed paralysis  Difficulty w/ muscle movement the day after surgery  Other reaction(s): Muscle Aches  Difficulty w/ muscle movement the day after surgery  ?????? Sulfasalazine Rash  ?????? Tegaderm Rash  ?????? Silver Rash  ?????? Sulfa (Sulfonamide Antibiotics) Swelling and Rash  ?????? Sulfanilamide Rash    Sulfonamides      Medications:  Current Outpatient Prescriptions  Medication Sig Dispense Refill  ?????? albuterol (PROVENTIL HFA) 90 mcg/actuation HFA Aerosol Inhaler Take 2 Puffs by  inhalation every 6 hours as needed.  ?????? CALCIUM CITRATE PO Take  by mouth daily.  ?????? CHOLECALCIFEROL, VITAMIN D3, PO Take  by mouth.  ?????? cyanocobalamin 100 mcg Tablet Take 100 mcg by mouth daily.  ?????? cyclobenzaprine (FLEXERIL) 10 mg tablet Take 10 mg by mouth 3 times daily as  needed for muscle spasm.  ?????? DULoxetine (CYMBALTA) 60 mg Capsule, Delayed Release(E.C.) Take 60 mg by mouth  daily. Reported on 07/24/2015  ?????? fluticasone (FLONASE) 50 mcg/Actuation NA nasal spray Spray 2 Sprays into nose  daily. Each nostril  ?????? gabapentin (NEURONTIN) 600 mg tablet Take 600 mg by mouth 2 times daily.  ?????? ipratropium (ATROVENT) 0.03 % NA nasal spray Spray 2 Sprays into nose 2 times  daily. Each nostril  ?????? lactobacillus rhamnosus (CULTURELLE) 10 billion cell Take 1 Cap by mouth  daily.  ?????? levothyroxine (SYNTHROID) 75 mcg PO tablet Take 66 mcg by mouth daily.  ?????? methylPREDNISolone (MEDROL, PAK,) 4 mg tab (dosepak) follow package directions  1 Dosepak 0    No current facility-administered medications for this visit.      ROS        Objective:  BP 136/82 (BP Site: Left arm, BP Position: Sitting)    Pulse 82    Wt 187 lb  (84.8 kg)    LMP 04/12/2008    BMI 28.43 kg/m??????  Physical Exam  Abdominal: There is no tenderness.  Genitourinary: Vagina normal.  Genitourinary Comments: S/p hyst no mases very tender bladder        Assessment:      ICD-10-CM  1. Proteinuria, unspecified type R80.9 URINALYSIS W/ REFLEX TO MICROSCOPIC    CBC (COMPLETE BLOOD  COUNT)    COMPREHENSIVE METABOLIC  PANEL    TSH (THYROID STIMULATING HORMONE)  2. Urinary tract infection without hematuria, site unspecified N39.0 URINALYSIS  W/ REFLEX TO MICROSCOPIC  3. Bladder pain R39.89      Plan:    Orders Placed This Encounter  Procedures  ?????? Urinalysis with reflex microscopic  ?????? CBC w/o differential(complete blood count)  ?????? Comprehensive metabolic panel  ?????? Tsh (thyroid stimulating hormone)    Encounter Meds      Treatment Recommendations:  Continues with recurrent uti's with fever at times cx always negative. To see  weizer soon but have to be aware of IC. Urine consists of dark color c/w  dehydration will get labs to see general health and pcp wants drawn, s/p hyst  and has h/o ibs and constipation and very constipated on exam. Will discuss  option after sees urology.    Total time with pt was 15 min with >50% of time spent face to face counseling  bladder issues    Follow-up and Disposition   Return in about 4 weeks (around 03/25/2017).

## 2017-03-09 DIAGNOSIS — N390 Urinary tract infection, site not specified: Secondary | ICD-10-CM | POA: Insufficient documentation

## 2017-03-09 NOTE — Unmapped (Signed)
Female  Patient  Encounter    CC:Wanda Rowe is a 57 y.o. female who presents today for: The primary  encounter diagnosis was Recurrent UTI. A diagnosis of Female pelvic pain was  also pertinent to this visit.    HPI:  Evaluation of recurrent UTI's- sx include back pain and supra pubic  pressure with urgency and fevers.   No gross hematuria- even when UC comes back neg antibiotics help sx when no  UTI.   No pelvic pain on voiding sx   UA positive Nitrates   No obvious modifying factors.   Non smoker   FHx positive for bladder cancer        Allergies:  Allergies  Allergen Reactions  ?????? Azithromycin    **DOES NOT WORK**    ?????? Succinylcholine Other (See Comments)    Other reaction(s): Muscle Aches  Delayed paralysis  Difficulty w/ muscle movement the day after surgery  Other reaction(s): Muscle Aches  Difficulty w/ muscle movement the day after surgery  ?????? Sulfasalazine Rash  ?????? Tegaderm Rash  ?????? Silver Rash  ?????? Sulfa (Sulfonamide Antibiotics) Swelling and Rash  ?????? Sulfanilamide Rash    Sulfonamides      .Medications:    Current Outpatient Prescriptions:  ??????  albuterol (PROVENTIL HFA) 90 mcg/actuation HFA Aerosol Inhaler, Take 2 Puffs  by inhalation every 6 hours as needed., Disp: , Rfl:  ??????  apixaban (ELIQUIS) 5 mg Tablet, Take 5 mg by mouth 2 times daily., Disp: ,  Rfl:  ??????  CALCIUM CITRATE PO, Take  by mouth daily., Disp: , Rfl:  ??????  CHOLECALCIFEROL, VITAMIN D3, PO, Take  by mouth., Disp: , Rfl:  ??????  cyanocobalamin 100 mcg Tablet, Take 100 mcg by mouth daily., Disp: , Rfl:  ??????  DULoxetine (CYMBALTA) 60 mg Capsule, Delayed Release(E.C.), Take 60 mg by  mouth daily. Reported on 07/24/2015, Disp: , Rfl:  ??????  fluticasone (FLONASE) 50 mcg/Actuation NA nasal spray, Spray 2 Sprays into  nose daily. Each nostril, Disp: , Rfl:  ??????  gabapentin (NEURONTIN) 600 mg tablet, Take 600 mg by mouth 2 times daily.,  Disp: , Rfl:  ??????  ipratropium (ATROVENT) 0.03 % NA nasal spray, Spray 2 Sprays into nose 2  times  daily. Each nostril , Disp: , Rfl:  ??????  lactobacillus rhamnosus (CULTURELLE) 10 billion cell, Take 1 Cap by mouth  daily., Disp: , Rfl:  ??????  levothyroxine (SYNTHROID) 75 mcg PO tablet, Take 66 mcg by mouth daily.,  Disp: , Rfl:  ??????  methylPREDNISolone (MEDROL, PAK,) 4 mg tab (dosepak), follow package  directions, Disp: 1 Dosepak, Rfl: 0    Past Medical History:   has a past medical history of Anemia; Anxiety; Arthritis; Asthma; Awareness  under anesthesia; Back problem; Cancer (CMS HCC); Chest pain; Complication of  anesthesia; Dysplasia; GERD (gastroesophageal reflux disease); Hernia;  Hypertension; Irregular heart beat; Liver disorder; Mobility impaired  (07/01/2016); Occlusive thrombus (2017); Osteopenia; Recurrent UTI (03/10/2017);  RSD (reflex sympathetic dystrophy); Sleep apnea; Suspected sleep apnea  (07/01/2016); Thyroid disorder; Type 2 diabetes mellitus (CMS HCC); and Venous  thrombosis and embolism.    Family History:  family history includes Cancer in an other family member; Cancer (age of onset:  38) in her maternal grandmother; Colon Cancer (age of onset: 30) in her maternal  grandfather; Diabetes in her father and another family member; Heart Problems in  her paternal grandfather; High Blood Pressure in her father and mother; Stroke  in an other family member.  Social History:   reports that she has never smoked. She has never used smokeless tobacco. She  reports that she drinks alcohol. She reports that she does not use drugs.    ROS:  Constitutional:  denies decreased appetite, headaches, fevers, chronic sweats or  weight loss of > 20 lbs.  Eyes:  denies blurred vision, double vision, cataracts or glaucoma.  Ears:  Hearing intact  Cardiovascular:  denies palpitations, heart murmur, heart failure, chest pain,  heart attack, blood clots  Respiratory:  denies SOB, coughing up blood, asthma/wheezing, chronic cough,  emphysema,  Gastrointestinal:  denies diarrhea, liver problems, gall bladder  disease,  constipation, stomach pain  Genitourinary:  denies infertility, painful urination, venereal disease.  Musculoskeletal:  denies muscle weakness, muscle/joint aches, herniated disk,  arthritis, slipped disc  Skin:  denies rash, moles/nevi, melanoma or skin cancer.  Neurologic:  denies tremors, stroke/TIA/Mini stroke, head injury, memory loss,  dizziness, seizures  Psychiatric Illness:  denies depression, anxiety, confusion.  Hematological/Lymphatic:  denies easy bruising, abnormal bleeding, anemia and  swollen lymph nodes.    PHYSICAL EXAM:  Resp 16    Ht 5' 8 (1.727 m)    Wt 191 lb (86.6 kg)    LMP 04/12/2008    BMI  29.04 kg/m??????  Constitutional: well appearing, in no acute distress, oriented  GI: no masses, tenderness, scars,hepatomegaly, hernia  [id number] labia, clitoris, hair pattern, without lesions        Urethra without caruncle, masses, see below        Bladder not palpable, no tenderness        Vagina without cystocele, without rectocele, without enterocele        Uterus non tender, no masses        Cervix no lesions, no masses        Adnexa without palpable masses        Anus/Perineum without prolapse, lesions  Neck: no masses, symmetrical, no thyroid enlargement  Resp; normal respiratory effort  CV: no cyanosis, no edema  Lymph3: neck, groin no lyphadenopathy  Skin: no rashes,lesions, ulcers  Psy: Oriented x 3, normal affect    Data Review: Reviewed pertinent clinical information inclusive of the following    Radiology:  Labs:  Review of UA: none    Impression/ Plan:    1.  Recurrent UTI  2.  Pelvic pain  Schedule RUS  Urine culture today  Schedule cysto BRGP and hydro-distention (GEN)    Encounter Diagnosis and Associated Orders:  Encounter Diagnoses  Code Name Primary?  ?????? N39.0 Recurrent UTI Yes  ?????? R10.2 Female pelvic pain        Teodora Medici, NCMA  Acting as a Neurosurgeon.  03/15/2017 12:38 PM    The documentation recorded by the scribe accurately reflects the service I  personally performed  and the decisions made by me.  Jodi Marble, MD  03/15/2017 12:38 PM

## 2017-03-09 NOTE — Unmapped (Signed)
DATE 03/19/17 CYSTO/ BRP/HYDRO (GEN) AT 1410.  PT INSTRUCTED TO ARRIVE ON C LEVEL  OF TCH AT 1200.  NPO AFTER MIDNIGHT, MUST HAVE A DRIVER.  LETTER GIVEN TO PT IN  OFFICE

## 2017-03-10 ENCOUNTER — Ambulatory Visit: Admit: 2017-03-10 | Discharge: 2017-03-10 | Payer: PRIVATE HEALTH INSURANCE

## 2017-03-10 DIAGNOSIS — Z01818 Encounter for other preprocedural examination: Secondary | ICD-10-CM

## 2017-03-10 LAB — POCT URINALYSIS DIPSTICK, NONAUTOMATED; W/O MICRO
POCT - Bilirubin, UA: 0
POCT - Blood, UA: 0
POCT - Glucose, UA: 0
POCT - Ketones, UA: 0
POCT - Leukocytes Esterase, UA: 0
POCT - Nitrite, UA: 0
POCT - Protein, UA: 0
POCT - Specific Gravity, Urine: 1.005
POCT - Urobilinogen, UA: 0
POCT - pH, UA: 8

## 2017-03-10 NOTE — Unmapped (Signed)
Ok for planned procedure. Pt will cont Dr. Selena Batten for advice on perioperative holding of eliquis.

## 2017-03-10 NOTE — Unmapped (Signed)
neurontin

## 2017-03-10 NOTE — Unmapped (Signed)
On eliquis

## 2017-03-10 NOTE — Unmapped (Signed)
Instructed pt to continue current meds

## 2017-03-10 NOTE — Unmapped (Signed)
[facility]  [facility]ARE AT [facility]  [address]  [place] [address]    Name:  Wanda Rowe Date of Birth: Sep 19, 1960 (57 y.o.)   MRN: [id number]    Date of Service:  03/10/2017     Subjective:     Chief Complaint   Patient presents with   ??? Pre-op Exam     History of Present Illness:  Wanda Rowe is a(n) 57 y.o. female here today for the following:   Preop consultation for recurrent UTI symptoms. Having suprapubic pressure at this time    Past Medical History:  No date: Anemia  No date: Anxiety  No date: Asthma      Comment:  Mild  No date: Breast cancer (CMS Dx)      Comment:    Right Breast  No date: Diabetes mellitus (CMS Dx)      Comment:  Type 2   resolved with gastric sleeve  No date: DVT (deep vein thrombosis) in pregnancy (CMS Dx)  No date: GERD (gastroesophageal reflux disease)  No date: Hyperlipidemia  No date: Hypertension  No date: Hypothyroidism  No date: Mitral valve prolapse  No date: Polyp of stomach  No date: RSD (reflex sympathetic dystrophy)      Comment:  R knee    Past Surgical History:  No date: ANTERIOR CRUCIATE LIGAMENT REPAIR      Comment:  L   No date: birth mark      Comment:  benign on buttocks deep  No date: BREAST BIOPSY; Left  No date: BREAST SURGERY; Right      Comment:  Lumpectomy chemo and radiation  No date: COLONOSCOPY  No date: COLONOSCOPY W/ POLYPECTOMY      Comment:  benign  No date: COLPOSCOPY  No date: DENTAL SURGERY      Comment:  Periodontal   07/19/2015: ESOPHAGOGASTRODUODENOSCOPY; N/A      Comment:  Procedure: ESOPHAGOGASTRODUODENOSCOPY WITH COLONOSCOPY                WITH MAC;  Surgeon: Lurene Shadow, MD;  Location:                [facility] ENDOSCOPY;  Service: Gastroenterology;  Laterality:                N/A;  No date: FOOT SURGERY  No date: gastric sleeve  No date: HYSTERECTOMY  05/22/2016: IR IVC FILTER PLACE  No date: JOINT REPLACEMENT; Left      Comment:  Knee  05/2016: KNEE SURGERY      Comment:  Left Knee Replacement  No date:  MENISCECTOMY      Comment:  L x 2, R x 1  No date: TYMPANOSTOMY TUBE PLACEMENT  No date: WISDOM TOOTH EXTRACTION    Social History    Marital status: Unknown             Spouse name:                       Years of education:                 Number of children:               Occupational History  Occupation          Psychiatric nurse  Attorney                                    Social History Main Topics    Smoking status: Never Smoker                                                                Smokeless tobacco: Never Used                        Comment: (06/23/2006)    Alcohol use: Yes                Comment: Occasionally (06/23/2006)    Drug use: No              Sexual activity: Not on file          Other Topics            Concern  Caffeine Use            Yes    Comment:rarely  Occupational Exposure   No  Exercise                Yes  Seat Belt               Yes            Review of patient's family history indicates:  Problem: Hypertension      Relation: Mother       Age of Onset: (Not Specified)   Problem: Other      Relation: Mother       Age of Onset: (Not Specified)       Comment: DDD  Problem: Diabetes      Relation: Father       Age of Onset: (Not Specified)       Comment: Type 2  Problem: Hypertension      Relation: Father       Age of Onset: (Not Specified)   Problem: Factor VIII deficiency      Relation: Father       Age of Onset: (Not Specified)   Problem: Other      Relation: Brother       Age of Onset: (Not Specified)       Comment: Back Pain  Problem: Diabetes      Relation: Maternal Grandmother       Age of Onset: (Not Specified)       Comment: Type 2  Problem: Cancer      Relation: Maternal Grandmother       Age of Onset: (Not Specified)       Comment: Bladder CA  Problem: Stroke      Relation: Maternal Grandmother       Age of Onset: (Not Specified)   Problem: Colon Cancer      Relation: Maternal Grandfather       Age of Onset: (Not Specified)   Problem: Breast Cancer       Relation: Paternal Grandmother       Age of Onset: (Not Specified)       Current Outpatient Prescriptions on File Prior to Visit:  albuterol (PROVENTIL;VENTOLIN;PROAIR) 90 mcg/actuation inhaler, Inhale 1-2  puffs into the lungs every 6 hours as needed., Disp: 1 Inhaler, Rfl: 3  apixaban (ELIQUIS) 5 mg Tab, Take 5 mg by mouth 2 times a day., Disp: , Rfl:   blood sugar diagnostic (ACCU-CHEK AVIVA PLUS TEST STRP) Strp, fsbs qd, Disp: 100 strip, Rfl: 1  blood-glucose meter (GLUCOSE MONITORING KIT) kit, by Other route 2 (two) times daily. Use as instructed - fsbs bid , Disp: , Rfl:   CALCIUM CITRATE ORAL, Take 1 tablet by mouth daily.  , Disp: , Rfl:   cholecalciferol, vitamin D3, 2,000 unit Cap, Take 1 capsule by mouth daily., Disp: 1 capsule, Rfl: 0  DULoxetine (CYMBALTA) 60 MG capsule, Take 1 capsule (60 mg total) by mouth daily., Disp: 30 capsule, Rfl: 5  fluticasone (FLONASE) 50 mcg/actuation nasal spray, Use 2 sprays into each nostril daily., Disp: 16 g, Rfl: 5  gabapentin (NEURONTIN) 600 MG tablet, Take 1 tablet (600 mg total) by mouth 3 times a day., Disp: 270 tablet, Rfl: 5  ipratropium (ATROVENT) 0.03 % nasal spray, Use 2 sprays into each nostril 2 times a day., Disp: 30 mL, Rfl: 5  LACTOBACILLUS ACIDOPHILUS (PROBIOTIC ORAL), Take 1 capsule by mouth daily., Disp: , Rfl:   levothyroxine (SYNTHROID, LEVOTHROID) 50 MCG tablet, Take 1 tablet (50 mcg total) by mouth every morning before breakfast., Disp: 30 tablet, Rfl: 5  UNABLE TO FIND, Med Name: thorne ferrasorb, Disp: 1 capsule, Rfl: 0  vit b complex w-b 12 tablet, Take 1 tablet by mouth daily., Disp: 30 tablet, Rfl: 0    No current facility-administered medications on file prior to visit.             Current Outpatient Prescriptions   Medication Sig Dispense Refill   ??? albuterol (PROVENTIL;VENTOLIN;PROAIR) 90 mcg/actuation inhaler Inhale 1-2 puffs into the lungs every 6 hours as needed. 1 Inhaler 3   ??? apixaban (ELIQUIS) 5 mg Tab Take 5 mg by mouth 2 times a  day.     ??? blood sugar diagnostic (ACCU-CHEK AVIVA PLUS TEST STRP) Strp fsbs qd 100 strip 1   ??? blood-glucose meter (GLUCOSE MONITORING KIT) kit by Other route 2 (two) times daily. Use as instructed - fsbs bid      ??? CALCIUM CITRATE ORAL Take 1 tablet by mouth daily.       ??? cholecalciferol, vitamin D3, 2,000 unit Cap Take 1 capsule by mouth daily. 1 capsule 0   ??? DULoxetine (CYMBALTA) 60 MG capsule Take 1 capsule (60 mg total) by mouth daily. 30 capsule 5   ??? fluticasone (FLONASE) 50 mcg/actuation nasal spray Use 2 sprays into each nostril daily. 16 g 5   ??? gabapentin (NEURONTIN) 600 MG tablet Take 1 tablet (600 mg total) by mouth 3 times a day. 270 tablet 5   ??? ipratropium (ATROVENT) 0.03 % nasal spray Use 2 sprays into each nostril 2 times a day. 30 mL 5   ??? LACTOBACILLUS ACIDOPHILUS (PROBIOTIC ORAL) Take 1 capsule by mouth daily.     ??? levothyroxine (SYNTHROID, LEVOTHROID) 50 MCG tablet Take 1 tablet (50 mcg total) by mouth every morning before breakfast. 30 tablet 5   ??? UNABLE TO FIND Med Name: thorne ferrasorb 1 capsule 0   ??? vit b complex w-b 12 tablet Take 1 tablet by mouth daily. 30 tablet 0     No current facility-administered medications for this visit.       Review of Systems   Constitutional: Negative for fatigue.   All other systems reviewed  and are negative.           Objective:     Vitals:    03/10/17 1159   BP: 118/74   Pulse: 77   Resp: 18   Weight: 194 lb 3.2 oz (88.1 kg)   Height: 5' 8 (1.727 m)     Body mass index is 29.53 kg/m??.    Physical Exam   Constitutional: She is oriented to person, place, and time. She appears well-developed and well-nourished.   HENT:   Head: Normocephalic and atraumatic.   Cardiovascular: Normal rate, regular rhythm and normal heart sounds.  Exam reveals no gallop and no friction rub.    No murmur heard.  Pulmonary/Chest: Effort normal and breath sounds normal.   Abdominal: Soft. Bowel sounds are normal.   Musculoskeletal: Normal range of motion.   Neurological: She  is alert and oriented to person, place, and time.   Psychiatric: She has a normal mood and affect. Her behavior is normal.                 Assessment/Plan:       Preop examination  Ok for planned procedure. Pt will cont Dr. Selena Batten for advice on perioperative holding of eliquis.    Acquired hypothyroidism  Instructed pt to continue current meds      Anxiety and depression  Instructed pt to continue current meds      DVT (deep venous thrombosis) (CMS Dx)  On eliquis    RSD lower limb  neurontin                    Brandan Robicheaux Johnn Hai, MD

## 2017-03-12 NOTE — Unmapped (Signed)
Subjective  Subjective:    Patient ID: Wanda Rowe  Age: 57 y.o. (DOB: 1960-10-24)  Ethnicity: Non-Hispanic  Race: White or Caucasian  Gender: female    Chief Complaint:  Chief Complaint  Patient presents with  ?????? AWV (Subsequent)    Pt is here today for annual exam - no problems      HPI ANNUAL HAS H/O ABN PAP SEEING DR Doran Durand AND HAS UROLOGIC W/U. CURRENTLY ON  ELIQUIS AND MEDROL    Past Medical History:  Diagnosis Date  ?????? Anemia   in past  ?????? Anxiety  ?????? Arthritis   knees  ?????? Asthma  ?????? Awareness under anesthesia   woke up during endoscopy  ?????? Back problem   occ back ache  ?????? Cancer (CMS HCC)   right breast- chemo. and radiation in 2014 & 2015, hx of Lumpectomy  ?????? Chest pain   when under stress-testing done & no cause of chest pain was found  ?????? Complication of anesthesia   rxn to succinylcholine-day after surgery, pt  had difficulty w/ muscle  movement-dr j collins in anesthesia dept notified- Informed Dr. Unknown Foley, AAC  for upcoming procedure on 07/07/16- UPDATE- Notified Dr. Regino Schultze, AAC for upcoming  procedure on 03/20/17- states to document and inform surgical booking to add to  case comments  ?????? Dysplasia  ?????? GERD (gastroesophageal reflux disease)   no meds  ?????? Hernia   hiatal-resolved with wt loss surg  ?????? Hypertension   in past & no longer taking medication  ?????? Irregular heart beat   MVP & heart murmur-used to take pre-dental antibiotics  ?????? Liver disorder   pt told she has a fatty liver-resolved per pt  ?????? Mobility impaired 07/01/2016   uses cane currently d/t recent s/p left knee replacement on 05/26/16  ?????? Occlusive thrombus 2017   dvts in past; IVC filter inserted 05/22/16  ?????? Osteopenia  ?????? Recurrent UTI 03/10/2017  ?????? RSD (reflex sympathetic dystrophy)  ?????? Sleep apnea   USES CPAP  ?????? Suspected sleep apnea 07/01/2016   pt. states prior to knee replacement when she was given preop meds, she heard  the nurses calling her name and states that something had dropped suddenly:  pt.  thinks it may have been her 02. She states she was advised to get a sleep study  in the future. pt. is not sure what med caused this to happen prior to  surgery  on 05/26/16.  ?????? Thyroid disorder   hypothyroidism  ?????? Type 2 diabetes mellitus (CMS HCC)   PT. STATES USED TO BE DIABETIC NO LONGER ON MEDS D/T WEIGHT LOSS  ?????? Venous thrombosis and embolism    OB History  Gravida Para Term Preterm AB Living  1 1       1   SAB TAB Ectopic Multiple Live Births      # Outcome Date GA Lbr Len/2nd Weight Sex Delivery Anes PTL Lv  1 Para      Past Surgical History:  Procedure Laterality Date  ?????? HX BREAST BIOPSY  july 2014   right  ?????? HX BREAST LUMPECTOMY  02-2013   right; also bilat reduction; back to surg next day for post op bleeding  ?????? HX COLPOSCOPY  ?????? HX FOOT SURGERY   right  ?????? HX GUM SURGERY   done for receding gums  ?????? HX HYSTERECTOMY, TOTAL ABDOMINAL  ?????? HX KNEE SURGERY   left x 3, right x 1  ?????? HX LAPAROSCOPY  ?????? HX  OTHER SURGICAL HISTORY  jan 2014   gastric sleeve  ?????? HX OTHER SURGICAL HISTORY   ins. portacath  ?????? HX TOTAL KNEE ARTHROPLASTY Left 05/26/2016  ?????? HX UPPER GI ENDOSCOPY   mult in past due to stomach polyps  ?????? HX VAGINAL DELIVERY  ?????? HX VASCULAR SURGERY  05/22/2016   IVC filter  ?????? REMOVAL OF OVARY/TUBE(S)   bilateral salpingo-oophorectomy    Social History  Substance Use Topics  ?????? Smoking status: Never Smoker  ?????? Smokeless tobacco: Never Used  ?????? Alcohol use Yes     Comment: very occasionally    History  Sexual Activity  ?????? Sexual activity: Yes  ?????? Partners: Male    Social History    Social History Narrative  ?????? No narrative on file    Family History  Problem Relation Age of Onset  ?????? High Blood Pressure Mother  ?????? Diabetes Father  ?????? High Blood Pressure Father  ?????? Heart Problems Paternal Grandfather       mi  ?????? Cancer Other       unspecified grandmother  ?????? Diabetes Other       unspecified grandmother  ?????? Stroke Other       unspecified grandmother  ?????? Colon Cancer  Maternal Grandfather 60  ?????? Cancer Maternal Grandmother 60       Bladder  ?????? Anesthesia Complications Neg Hx      Medications:  Current Outpatient Prescriptions  Medication Sig Dispense Refill  ?????? albuterol (PROVENTIL HFA) 90 mcg/actuation HFA Aerosol Inhaler Take 2 Puffs by  inhalation every 6 hours as needed.  ?????? apixaban (ELIQUIS) 5 mg Tablet Take 5 mg by mouth 2 times daily.  ?????? CALCIUM CITRATE PO Take  by mouth daily.  ?????? CHOLECALCIFEROL, VITAMIN D3, PO Take  by mouth.  ?????? cyanocobalamin 100 mcg Tablet Take 100 mcg by mouth daily.  ?????? DULoxetine (CYMBALTA) 60 mg Capsule, Delayed Release(E.C.) Take 60 mg by mouth  daily. Reported on 07/24/2015  ?????? fluticasone (FLONASE) 50 mcg/Actuation NA nasal spray Spray 2 Sprays into nose  daily. Each nostril  ?????? gabapentin (NEURONTIN) 600 mg tablet Take 600 mg by mouth 2 times daily.  ?????? ipratropium (ATROVENT) 0.03 % NA nasal spray Spray 2 Sprays into nose 2 times  daily. Each nostril  ?????? lactobacillus rhamnosus (CULTURELLE) 10 billion cell Take 1 Cap by mouth  daily.  ?????? levothyroxine (SYNTHROID) 75 mcg PO tablet Take 66 mcg by mouth daily.  ?????? methylPREDNISolone (MEDROL, PAK,) 4 mg tab (dosepak) follow package directions  1 Dosepak 0    No current facility-administered medications for this visit.      Allergies:  Allergies  Allergen Reactions  ?????? Azithromycin    **DOES NOT WORK**    ?????? Succinylcholine Other (See Comments)    Other reaction(s): Muscle Aches  Delayed paralysis  Difficulty w/ muscle movement the day after surgery  Other reaction(s): Muscle Aches  Difficulty w/ muscle movement the day after surgery  ?????? Sulfasalazine Rash  ?????? Tegaderm Rash  ?????? Silver Rash  ?????? Sulfa (Sulfonamide Antibiotics) Swelling and Rash  ?????? Sulfanilamide Rash    Sulfonamides      Review of Systems  Genitourinary: Positive for dysuria, frequency and urgency.  Psychiatric/Behavioral: The patient is nervous/anxious.  All other systems reviewed and are  negative.      General Negative  Skin Negative  Eyes Negative for visions concerns  HENT Negative  Endo Negative  Respiratory Negative for shortness of breath  Cardiovascular Negative for  chest pain, palpitations  Neurological Negative for migraines or neurological issues  GI Negative for pain, persistent bloating or ongoing change in bowel habits and  bloody stool  GU Negative for incontinence, dysuria, hematuria  Musculoskeletal Negative  Psychological Negative for depression/anxiety        Objective:  BP 112/70 (BP Site: Left arm, BP Position: Sitting, BP Cuff Size: Adult)     Pulse 72    Ht 5' 8 (1.727 m)    Wt 192 lb (87.1 kg)    LMP 04/12/2008     Breastfeeding? No    BMI 29.19 kg/m??????  Physical Exam  Constitutional: She is oriented to person, place, and time. She appears  well-developed and well-nourished.  HENT:  Head: Normocephalic and atraumatic.  Neck: Normal range of motion. Neck supple. No tracheal deviation present. No  thyromegaly present.  Cardiovascular: Normal rate, regular rhythm, normal heart sounds and intact  distal pulses.  Pulmonary/Chest: Breath sounds normal.  Abdominal: Bowel sounds are normal. There is no tenderness. Hernia confirmed  negative in the right inguinal area and confirmed negative in the left inguinal  area.  Genitourinary: Rectum normal, vagina normal and uterus normal. No breast  swelling, tenderness, discharge or bleeding. There is no rash, tenderness,  lesion or injury on the right labia. There is no rash, tenderness, lesion or  injury on the left labia. Uterus is not deviated, not enlarged, not fixed and  not tender. Cervix exhibits no motion tenderness, no discharge and no  friability. Right adnexum displays no mass, no tenderness and no fullness. Left  adnexum displays no mass, no tenderness and no fullness. No erythema, tenderness  or bleeding in the vagina. No vaginal discharge found.  Genitourinary Comments: No masses atrophic changes  Musculoskeletal: Normal range  of motion.  Lymphadenopathy:    She has no cervical adenopathy.       Right: No inguinal adenopathy present.       Left: No inguinal adenopathy present.  Neurological: She is alert and oriented to person, place, and time. She has  normal reflexes.  Skin: Skin is warm and dry.  Psychiatric: She has a normal mood and affect. Her behavior is normal. Judgment  and thought content normal.        Assessment:      ICD-10-CM  1. Well female exam with routine gynecological exam Z01.419 PAP WITH HPV DNA  2. Malignant neoplasm of right female breast, unspecified estrogen receptor  status, unspecified site of breast (CMS HCC) C50.911  3. Screening for HPV (human papillomavirus) Z11.51  4. 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 Screening mammogram, encounter for Z12.31 MAMM-SCREENING DIRECT DIGITAL      Plan:    Orders Placed This Encounter  Procedures  ?????? MAMM-screening direct digital  ?????? Thin Prep Pap Cotest HPV - Over 30    Encounter Meds    Counseled patient about breast self exam, mammography screening, PAP, use and  side effects of HRT, sexual function, menopause, osteoporosis, diet/nutrition,  physical activity/exercise, anxiety, work satisfaction and call with pap and  discussed urologic evaluation.    Follow-up and Disposition   Return in about 6 months (around 09/09/2017).

## 2017-03-16 NOTE — Telephone Encounter (Signed)
LMOVM to advise patient explaining insurance plan coverage varies, but she should be getting a new mask every 3 mo and replacement cushion or pillow sooner, depending on insurance plan.  I did advise patient to call her insurance and speak to someone who handles he durable medical equipment supplies to verify what she is eligible for and how often.

## 2017-03-16 NOTE — Unmapped (Signed)
Pt called in stating Wanda Rowe mentioned to her she would get a new face mask every two weeks. Pt stated Medical Services is stating it should be every 3 months. Pt is calling in to clarify if it should be every 2 weeks or every 3 months. Pt can be reached at [phone].

## 2017-03-19 NOTE — Unmapped (Signed)
[facility]    Patient Name: Wanda Rowe  Date of Birth: 1960/06/28    MRN: [id number]  CSN: [id number]    Date of Procedure: 03/19/2017    Pre-operative Diagnosis: Female pelvic pain [R10.2]  Recurrent UTI [N39.0] ; Interstitial Cystitis  Post-operative Diagnosis: same  Procedure: Cystoscopy, Bilateral Retrograde Pyelograms; Hydrodistention of the  Bladder    Surgeon: Jodi Marble, M.D.  Assistant: None    Anesthesia: General LMA    Indications: [name] is a 57 y.o. with IC and Recurrent UTIs .  The  procedure, alternative treatments and evaluations, essential risks, benefits and  expected outcomes were discussed in detail with the patient and they desired to  proceed with the procedure.    Procedure Details  The patient was taken to the cystoscopy suite and placed in the supine position  on the cystoscopy table.  A time out was taken to identify the patient and  confirm the operation and site.  A  General  anaesthetic was administered  and  the patient placed in dorsolithotomy position,  prepped with povidone iodine  scrub, and draped in the usual sterile fashion.   A 22 Fr.  Cystoscope sheath,  30 and 70 degree lens and camera were used to inspect both the urethra and  bladder.  Bilateral retrograde pyelograms were performed by injecting sterile contrast  through a 8 Fr Cone Tip Catheter. Filling and drainage was observed with  Flouroscopy.    Findings:  Anterior urethra: normal without strictures, scarring or mucosal lesions.  Bladder:  1+  trabeculation without diverticula,and without suspicious mucosal  lesions.  Ureteral orifices were were seen bilaterally.  Ureteral orifices were in the  normal location and both ureteral orifices were effluxing clear urine.  Right Ureter: Complete filling of the right ureter without filling defects,  stones or significant ureterectasis. Good peristalsis was seen post injection.  Right Renal Collecting System: Complete filling was seen. No  pelvo-calyiectasis,  filling defects or stones.  Left Ureter: Complete filling of the left ureter without filling defects, stones  or significant ureterectasis. Good peristalsis was seen post injection.  Left Renal Collecting System: Complete filling was seen. No pelvo-calyiectasis,  filling defects or stones.    The bladder was then hydrodistended to 80 cm H20 pressure for 6 minutes then  drained and reexamined.    Findings:  Anterior urethra: Normal without strictures and without scarring.  Bladder: Normal mucosa, without lesions. After hydrodistension there were  multiple glomerulations noted with occasional frank bleeding. No Hunner's ulcers  were identified. No tumors were identified.  Ureteral orifices: Bilaterally were in the normal location and were effluxing  clear urine.    Complications: none  Estimated Blood Loss: None  Specimens: none  Disposition: To PACU in Stable Condition  Attestation: I performed the procedure  Fax Copy To: PCP  (Faxed per EPIC)

## 2017-03-22 MED ORDER — ipratropium (ATROVENT) 0.03 % nasal spray
21 | NASAL | 0 refills | 30.00000 days | Status: AC
Start: 2017-03-22 — End: 2017-07-15

## 2017-03-22 NOTE — Unmapped (Signed)
Pt due for refill.  Pt has appt scheduled

## 2017-03-29 NOTE — Unmapped (Signed)
Follow Up Form  Chief Complaint  Patient presents with  ?????? Breast Cancer Hx    35mo. F/U Rt Invasive ductal carcinoma  ?????? Imaging Results    Lt breast Mammo w/ TOMO done today 03/29/17      Wanda Rowe is a 57 year old woman, who is here for her 6 month follow up visit  for right breast cancer. She was on Evista and stopped after developing a DVT in  August 2017.    She reports having a hematoma and DVT in July and had a hyper coag work up,  which was all negative. Still on blood thinner and will likely stop in March.  Still has IVC filter in place.            She did undergo imaging, today. This included left dx tomosynthesis. Results are  as follows:    MAMM-DIAG LEFT W/TOMO    REASON FOR EXAMINATION: Abnormal breast MRI, history of  breast cancer    COMPARISON: August 03, 2016    LIMITATIONS: None.    DENSITY: The breasts are heterogeneously dense, which may obscure small  masses.    FINDINGS:    Computer aided detection assisted in the interpretation of the mammogram.  Biopsy clip now present at the 12:00 position of the anterior left breast.  Widely scattered punctate and macrocalcifications stable. No new clustered  microcalcifications or masses. No new areas of architectural distortion.      IMPRESSION:  No direct or indirect evidence of malignancy is seen on the current study.    BI-RADS CATEGORY: 2 - BENIGN    RECOMMENDED FOLLOW-UP: Bilateral Short Interval Follow-up recommend  bilateral mammogram due in 6 months.      Results and recommendations were reviewed with the patient.    This study was performed and interpreted using full-field digital  mammographic and computer-aided detection.    The [employer] recommends annual screening mammography  for women age 23 and above.    Please note that mammography is not 100% accurate in diagnosing breast  cancer. A routine breast examination is recommended to correlate with  mammographic findings.    Signed By: Rema Jasmine MD, Libby Maw:    BREAST  CANCER HX  Tumor Size: Right IDC, stage I  T: T1b  N: N0  M: cM0  Estrogen: negative  Progesterone: negative  Her 2 neu: positive by IHC  Oncotype Score: None  Chemotherapy: neoadjuvantTCHP (completed 12/26/12)  Radiation Therapy: Right WBR without Boost (42.56Gy in 16 Fx completed 04/19/14)  Endocrine Therapy: Evista  Breast Surgery Type/Date: right wire loc lumpectomy w/SNB  DX Date: 08/29/12    OB-GYN  Age of Menarche: 10  G: 1  P: 1  Age of 1st Delivery: 83  Menopause Age: 34 (Partial hysterectomy)  BCP Use: yes, 5 years  Fertility Treatment: no  Hormone Replacement: no    IMAGING/BIOPSY HX  Age of 1st mammogram: 77  Date of last mammogram: 03/29/17 (Lt breast with TOMO)  Frequency : annually  Institution : [facility]  Past Biopsies: yes, 2014, ID[facility]    BREAST CA RISK ASSESSMENT  Family HX Breast CA: No  Family HX Ovarian CA: No  Family HX Other CAs: Yes (MGM bladder 60's, MGF colon 60's)  BRCA Testing?: No    Imaging performed: Left dx tomosynthesis    Medical/Surgical/Family/Social history updates since previous visit: May be  moving to [place] in the Fall, but it is not  yet clear, as the company  may be bought out.    Review of Systems  Constitutional: Negative.  Negative for chills, diaphoresis, fever,  malaise/fatigue and weight loss.  Respiratory: Negative for cough and shortness of breath.       She was diagnosed with sleep apnea and interstitial cystitis. She is on a  CPAP, still adjusting to it, hasn't noticed a difference, but her number of  sleep disruptions has gone down, significantly.  Cardiovascular: Negative for chest pain and leg swelling.  Gastrointestinal: Negative for abdominal pain, nausea and vomiting.  Genitourinary:       She is getting recurrent UTIs with fever, spasms and dysuria. Her U/A was  negative and she got better on antibiotics. On a gluten limited diet, but does  cheat. She is seeing Dr. Doran Durand.  Musculoskeletal: Negative for back pain, joint pain and neck  pain.  Skin: Negative for itching and rash.  Neurological: Negative for weakness and headaches.  Psychiatric/Behavioral: Negative for depression and suicidal ideas. The patient  is not nervous/anxious.  All other systems reviewed and are negative.      Physical Exam  Constitutional: She is oriented to person, place, and time. She appears  well-developed and well-nourished.  HENT:  Head: Normocephalic and atraumatic.  Eyes: EOM are normal.  Neck: Normal range of motion. Neck supple. No JVD present. No tracheal deviation  present. No thyromegaly present.  Cardiovascular: Normal rate.  Pulmonary/Chest: Effort normal. No stridor. No respiratory distress. She  exhibits no tenderness. Right breast exhibits skin change. Right breast exhibits  no inverted nipple, no mass, no nipple discharge and no tenderness. Left breast  exhibits skin change. Left breast exhibits no inverted nipple, no mass, no  nipple discharge and no tenderness. Breasts are symmetrical.      Bilateral breasts with everted nipples. All incisions well healed. No palpable  masses and no other skin changes.  Abdominal: Soft. She exhibits no distension and no mass. There is no tenderness.  There is no rebound and no guarding.  Musculoskeletal: Normal range of motion. She exhibits no edema or tenderness.  Lymphadenopathy:       Head (right side): No submandibular adenopathy present.       Head (left side): No submandibular adenopathy present.    She has no cervical adenopathy.       Right cervical: No superficial cervical and no deep cervical adenopathy  present.       Left cervical: No superficial cervical and no deep cervical adenopathy  present.    She has no axillary adenopathy.       Right axillary: No pectoral and no lateral adenopathy present.       Left axillary: No pectoral and no lateral adenopathy present.       Right: No supraclavicular adenopathy present.       Left: No supraclavicular adenopathy present.  Neurological: She is alert and oriented  to person, place, and time. No cranial  nerve deficit. Coordination normal.  Skin: Skin is warm and dry. No rash noted. No erythema. No pallor.  Psychiatric: She has a normal mood and affect. Her behavior is normal. Judgment  and thought content normal.  Nursing note and vitals reviewed.    Vitals:   03/29/17 0839  BP: 115/81  Pulse: 66  Temp: 98.4 ??????F (36.9 ??????C)  TempSrc: Oral  Weight: 190 lb (86.2 kg)  Height: 5' 7 (1.702 m)      Assessment: 57 year old woman 4.5 years out  from dx of right IDC, treated with  lumpectomy, SLNB, chemotherapy and radiation. Imaging benign. NED.      Plan: If moving in the fall, will repeat MRI in August 2019. Try gluten free  diet. Continue monthly self exam.    Data reviewed: x-ray, review old MR and/or Hx other source and/or discussion of  case with another provider and Drs visual test/study    Patient's Medications  Current Medications   ALBUTEROL (PROVENTIL HFA) 90 MCG/ACTUATION HFA AEROSOL INHALER    Take 2 Puffs  by inhalation every 6 hours as needed.      Order Dose: 2 Puffs   APIXABAN (ELIQUIS) 5 MG TABLET    Take 5 mg by mouth 2 times daily.      Order Dose: 5 mg   CALCIUM CITRATE PO    Take  by mouth daily.      Order Dose: --   CHOLECALCIFEROL, VITAMIN D3, PO    Take  by mouth.      Order Dose: --   CYANOCOBALAMIN 100 MCG TABLET    Take 100 mcg by mouth daily.      Order Dose: 100 mcg   DOCUSATE SODIUM (COLACE) 100 MG CAPSULE    Take 100 mg by mouth 2 times daily.      Order Dose: 100 mg   DULOXETINE (CYMBALTA) 60 MG CAPSULE, DELAYED RELEASE(E.C.)    Take 60 mg by  mouth daily. Reported on 07/24/2015      Order Dose: 60 mg   FLUTICASONE (FLONASE) 50 MCG/ACTUATION NA NASAL SPRAY    Spray 2 Sprays into  nose daily. Each nostril      Order Dose: 2 Sprays   GABAPENTIN (NEURONTIN) 600 MG TABLET    Take 600 mg by mouth 2 times daily.      Order Dose: 600 mg   IPRATROPIUM (ATROVENT) 0.03 % NA NASAL SPRAY    Spray 2 Sprays into nose 2  times daily. Each nostril      Order Dose: 2  Sprays   LACTOBACILLUS RHAMNOSUS (CULTURELLE) 10 BILLION CELL    Take 1 Cap by mouth  daily.      Order Dose: 1 Cap   LEVOTHYROXINE (SYNTHROID) 75 MCG PO TABLET    Take 66 mcg by mouth daily.      Order Dose: 66 mcg      Follow up: 6 months    Imaging needed: Bilateral dx tomosynthesis    I spent 20 minutes face to face with the patient, greater than 75% of which was  dedicated toward counseling.    Amie Portland, CMA , acting as a scribe for Dr. Ammie Ferrier    The above note has been reviewed and reflects the work and decisions made by the  physician.  Victorino Dike B. Vanice Sarah, MD, [facility]    03/29/2017    [facility] BRE[facility]  [facility] [place]  [address], [address]  [place] [address]  Dept: [phone]  Dept Fax: [phone]  Loc: [phone]  Loc Fax: [phone]

## 2017-04-06 NOTE — Unmapped (Signed)
Female  Patient  Encounter    CC:Wanda Rowe is a 57 y.o. female who presents today for: There were no  encounter diagnoses.    HPI:  Evaluation of recurrent UTI's- and I.C with OAB   S/P cysto/ hydrodistention bilateral RGP and Renal US normal   UA positive Nitrates   No obvious modifying factors.   Non smoker   FHx positive for bladder cancer        Allergies:  Allergies  Allergen Reactions  ?????? Azithromycin    **DOES NOT WORK**    ?????? Succinylcholine Other (See Comments)    Other reaction(s): Muscle Aches  Delayed paralysis  Difficulty w/ muscle movement the day after surgery  Other reaction(s): Muscle Aches  Difficulty w/ muscle movement the day after surgery  ?????? Sulfasalazine Rash  ?????? Tegaderm Rash  ?????? Silver Rash  ?????? Sulfa (Sulfonamide Antibiotics) Swelling and Rash  ?????? Sulfanilamide Rash    Sulfonamides      .Medications:    Current Outpatient Prescriptions:  ??????  albuterol (PROVENTIL HFA) 90 mcg/actuation HFA Aerosol Inhaler, Take 2 Puffs  by inhalation every 6 hours as needed., Disp: , Rfl:  ??????  apixaban (ELIQUIS) 5 mg Tablet, Take 5 mg by mouth 2 times daily., Disp: ,  Rfl:  ??????  CALCIUM CITRATE PO, Take  by mouth daily., Disp: , Rfl:  ??????  CHOLECALCIFEROL, VITAMIN D3, PO, Take  by mouth., Disp: , Rfl:  ??????  cyanocobalamin 100 mcg Tablet, Take 100 mcg by mouth daily., Disp: , Rfl:  ??????  docusate sodium (COLACE) 100 mg capsule, Take 100 mg by mouth 2 times daily.,  Disp: , Rfl:  ??????  DULoxetine (CYMBALTA) 60 mg Capsule, Delayed Release(E.C.), Take 60 mg by  mouth daily. Reported on 07/24/2015, Disp: , Rfl:  ??????  fluticasone (FLONASE) 50 mcg/Actuation NA nasal spray, Spray 2 Sprays into  nose daily. Each nostril, Disp: , Rfl:  ??????  gabapentin (NEURONTIN) 600 mg tablet, Take 600 mg by mouth 2 times daily.,  Disp: , Rfl:  ??????  ipratropium (ATROVENT) 0.03 % NA nasal spray, Spray 2 Sprays into nose 2  times daily. Each nostril , Disp: , Rfl:  ??????  lactobacillus rhamnosus (CULTURELLE) 10 billion cell,  Take 1 Cap by mouth  daily., Disp: , Rfl:  ??????  levothyroxine (SYNTHROID) 75 mcg PO tablet, Take 66 mcg by mouth daily.,  Disp: , Rfl:    Past Medical History:   has a past medical history of Anemia; Anxiety; Arthritis; Asthma; Awareness  under anesthesia; Back problem; Cancer (CMS HCC); Chest pain; Complication of  anesthesia; Dysplasia; GERD (gastroesophageal reflux disease); Hernia;  Hypertension; Irregular heart beat; Liver disorder; Mobility impaired  (07/01/2016); Occlusive thrombus (2017); Osteopenia; Recurrent UTI (03/10/2017);  RSD (reflex sympathetic dystrophy); Sleep apnea; Suspected sleep apnea  (07/01/2016); Thyroid disorder; Type 2 diabetes mellitus (CMS HCC); and Venous  thrombosis and embolism.    Family History:  family history includes Cancer in an other family member; Cancer (age of onset:  20) in her maternal grandmother; Colon Cancer (age of onset: 40) in her maternal  grandfather; Diabetes in her father and another family member; Heart Problems in  her paternal grandfather; High Blood Pressure in her father and mother; Stroke  in an other family member.    Social History:   reports that she has never smoked. She has never used smokeless tobacco. She  reports that she drinks alcohol. She reports that she does not use drugs.  ROS:  Constitutional:  denies decreased appetite, headaches, fevers, chronic sweats or  weight loss of > 20 lbs.  Eyes:  denies blurred vision, double vision, cataracts or glaucoma.  Ears:  Hearing intact  Cardiovascular:  denies palpitations, heart murmur, heart failure, chest pain,  heart attack, blood clots  Respiratory:  denies SOB, coughing up blood, asthma/wheezing, chronic cough,  emphysema,  Gastrointestinal:  denies diarrhea, liver problems, gall bladder disease,  constipation, stomach pain  Genitourinary:  denies infertility, painful urination, venereal disease.  Musculoskeletal:  denies muscle weakness, muscle/joint aches, herniated disk,  arthritis, slipped  disc  Skin:  denies rash, moles/nevi, melanoma or skin cancer.  Neurologic:  denies tremors, stroke/TIA/Mini stroke, head injury, memory loss,  dizziness, seizures  Psychiatric Illness:  denies depression, anxiety, confusion.  Hematological/Lymphatic:  denies easy bruising, abnormal bleeding, anemia and  swollen lymph nodes.    PHYSICAL EXAM:  Resp 16    Ht 5' 7 (1.702 m)    Wt 190 lb (86.2 kg)    LMP 04/12/2008    BMI  29.76 kg/m??????  Constitutional: well appearing, in no acute distress, oriented  GI: no masses, tenderness, scars,hepatomegaly, hernia  Neck: no masses, symmetrical, no thyroid enlargement  Resp; normal respiratory effort  CV: no cyanosis, no edema  Lymph3: neck, groin no lyphadenopathy  Skin: no rashes,lesions, ulcers  Psy: Oriented x 3, normal affect    Data Review: Reviewed pertinent clinical information inclusive of the following    Radiology:  Labs:  Review of UA: none    Impression/ Plan:    1.  Recurrent UTI  2.  Pelvic pain / I.C  3. OAB  RUS normal 2/19  Urine culture today  Cysto BRGP and hydro-distention pain resolved  Still with frequency and urgency- discussed Ditropan XL 10 mg QD but patient  wants to observe  Follow up in office 3 months    Encounter Diagnosis and Associated Orders:  No diagnosis found.      Kimberly A. Leo Grosser  Acting as a Neurosurgeon.  04/13/2017 2:22 PM    The documentation recorded by the scribe accurately reflects the service I  personally performed and the decisions made by me.  Jodi Marble, MD  04/13/2017 2:22 PM

## 2017-04-23 NOTE — Unmapped (Signed)
Patient is calling to schedule an OV with Dr. Mickie Bail for rectal bleeding. The patient had a procedure done with Dr. Mickie Bail in 2017 will the patient need a referral to schedule? Please advise. On how to schedule.

## 2017-04-23 NOTE — Unmapped (Signed)
appt made with the CNP offered any day next week.

## 2017-04-30 ENCOUNTER — Inpatient Hospital Stay: Admit: 2017-04-30 | Payer: PRIVATE HEALTH INSURANCE | Attending: Gerontology

## 2017-04-30 ENCOUNTER — Ambulatory Visit: Admit: 2017-04-30 | Payer: PRIVATE HEALTH INSURANCE | Attending: Gerontology

## 2017-04-30 DIAGNOSIS — K625 Hemorrhage of anus and rectum: Secondary | ICD-10-CM

## 2017-04-30 DIAGNOSIS — K5909 Other constipation: Secondary | ICD-10-CM

## 2017-04-30 MED ORDER — polyethylene glycol (GOLYTELY,NULYTELY) 236-22.74-6.74 -5.86 gram solution
236-22.74-6.74 | Freq: Once | ORAL | 0 refills | 14.00000 days | Status: AC
Start: 2017-04-30 — End: 2017-04-30

## 2017-04-30 NOTE — Unmapped (Signed)
Please have the abdominal xray completed at the [facility] building, first floor, [address].  I will call you with results and recommendations.

## 2017-04-30 NOTE — Unmapped (Signed)
GASTROENTEROLOGY   HISTORY OF PRESENT ILLNESS:    The patient is a 57 y.o. female who is here today for rectal bleeding.  07/09/15 had surveillance EGD and Colonoscopy with Dr. Mickie Bail (see full results below) for hx of gastric hyperplastic polyp with HGD and hx of colon polyps.  Says she has a hx of IBS primarily diarrhea but after 07/2015 endoscopies, is experiencing constipation.  Stool are small balls 3-4 times per day, and inadequate with straining sometimes.  Now having BRBPR,3 episodes when wiping   On Eliquis 5mg  BID for hx of blood clots since 08/2016, potentially coming off this.  GYN started Colace BID and Miralax, told she was severely constipated 2 month ago.  Colace is not helping.  She was nervous to take Miralax as she didn't want diarrhea.  No melena  Has increase gas  Thinks she has a gluten intolerance   When she eats gluten her gas increases and gives her diarrhea.  Feels bloated   No abdominal pain  Having lots of heartburn for last year   Not on any antacids as she has a hx of precancerous gastric polyps  Very occasional regurgitation  No dysphagia or odynophagia  Appetite good  Weight gain per patient  On Gabapentin 600mg  TID for 20 years   Denies CP, SOB, presyncope and fatigue   CBC, CMP 04/19/17 at [place] is normal  TSH 02/2017 was 0.91, on Synthroid       Procedure History:  Colonoscopy 07/09/15-The entire examined colon is normal. No specimens collected. Repeat colonoscopy in 7 years for surveillance.  EGD 07/09/15- LA Grade C reflux esophagitis. S/p gastric sleeve. No gastric polyps. Use Prilosec (omeprazole) 20 mg PO daily.    GI FamHx:  MGF- Colon Cancer 60s.    Past Medical History:   Diagnosis Date   ??? Anemia    ??? Anxiety    ??? Asthma     Mild   ??? Breast cancer (CMS Dx)       Right Breast   ??? Diabetes mellitus (CMS Dx)     Type 2   resolved with gastric sleeve   ??? DVT (deep vein thrombosis) in pregnancy (CMS Dx)    ??? GERD (gastroesophageal reflux disease)    ??? Hyperlipidemia    ???  Hypertension    ??? Hypothyroidism    ??? Mitral valve prolapse    ??? Polyp of stomach    ??? RSD (reflex sympathetic dystrophy)     R knee     Past Surgical History:   Procedure Laterality Date   ??? ANTERIOR CRUCIATE LIGAMENT REPAIR      L    ??? birth mark      benign on buttocks deep   ??? BREAST BIOPSY Left    ??? BREAST SURGERY Right     Lumpectomy chemo and radiation   ??? COLONOSCOPY     ??? COLONOSCOPY W/ POLYPECTOMY      benign   ??? COLPOSCOPY     ??? DENTAL SURGERY      Periodontal    ??? ESOPHAGOGASTRODUODENOSCOPY N/A 07/19/2015    Procedure: ESOPHAGOGASTRODUODENOSCOPY WITH COLONOSCOPY WITH MAC;  Surgeon: Lurene Shadow, MD;  Location: [facility] ENDOSCOPY;  Service: Gastroenterology;  Laterality: N/A;   ??? FOOT SURGERY     ??? gastric sleeve     ??? HYSTERECTOMY     ??? IR IVC FILTER PLACE  05/22/2016   ??? JOINT REPLACEMENT Left     Knee   ??? KNEE SURGERY  05/2016    Left Knee Replacement   ??? MENISCECTOMY      L x 2, R x 1   ??? TYMPANOSTOMY TUBE PLACEMENT     ??? WISDOM TOOTH EXTRACTION       Family History   Problem Relation Age of Onset   ??? Hypertension Mother    ??? Other Mother         DDD   ??? Diabetes Father         Type 2   ??? Hypertension Father    ??? Factor VIII deficiency Father    ??? Other Brother         Back Pain   ??? Diabetes Maternal Grandmother         Type 2   ??? Cancer Maternal Grandmother         Bladder CA   ??? Stroke Maternal Grandmother    ??? Colon Cancer Maternal Grandfather    ??? Breast Cancer Paternal Grandmother      Social History     Social History   ??? Marital status: Unknown     Spouse name: N/A   ??? Number of children: N/A   ??? 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 Smoking status: Never Smoker   ??? Smokeless tobacco: Never Used      Comment: (06/23/2006)   ??? Alcohol use Yes      Comment: Occasionally (06/23/2006)   ??? Drug use: No   ??? Sexual activity: Not on file     Other Topics Concern   ??? Caffeine Use Yes     rarely   ??? Occupational Exposure No   ??? Exercise Yes   ???  Seat Belt Yes     Social History Narrative   ??? No narrative on file     REVIEW OF SYSTEMS:    See pertinent positives in HPI    PHYSICAL EXAM:    General: Normal appearance, normal affect.    Head: Normocephalic, no lesions.    Eyes: Sclera normal color. Normal hydration.    Ears: Normal.    Nose: Mucosa normal, no sinus tenderness.    Neck: No masses, no thyromegaly, no adenopathy, no bruits.    Chest: Lungs clear, no rales, no rhonchi, no wheezes.    Heart: Regular rhythm, no murmurs, no rubs, no gallops.    Abdomen: Soft, no tenderness, no masses, no enlargement of the liver or spleen, normal peristaltic sounds.    Extremities: No deformities, no edema, no erythema, normal peripheral pulses.    Neuro: Normal mental status, normal tendon reflexes, normal gait       IMPRESSION/RECOMMENDATIONS:    57 y/o woman here today for recent BRBPR (3 occurrences) and constipation since 07/2015.  Is on Gabapentin x20 years, and Synthroid.  Has hx of total abdominal hysterectomy, gastric sleeve surgery, precancerous gastric polyps, and LA grade C esophagitis.  EGD 07/2015 (Dr. Mickie Bail) showed LA grade C esophagitis, no gastric polyps.  Had normal colonoscopy 07/2015 (Dr. Mickie Bail), due for surveillance in 7 years (07/2022).  MGF had Colon Cancer in his 26s.  Says she has a hx of IBS-D for many years until 07/2015 when constipation started.  Is having 3-4 small inadequate balls of stool daily with straining.  Recently noticed BRBPR when wiping for 3 occurrences recently.  GYN started Colace BID and suggested Miralax.  She didn't start  Miralax as she was nervous to have diarrhea again.  Is also having heartburn, bloating, gassiness, and wt gain.  No CP, SOB, presyncope, or fatigue.  04/19/17 CBC and BMP normal (Care [facility] at [facility]).    Suspect BRBPR is hemorrhoidal and other symptoms discussed today, are related to constipation.    We discussed KUB vs empiric bowel cleanse then daily Miralax.  She prefers to have KUB  today.  Will call her with results and further recommendations.  If her heartburn does not resolve once bowel habits are improved, consider repeat EGD with hx of LA Grade C esophagitis.  Of note- She does not want to take Any antacids with her hx of gastric polyps.    Electronically signed by Roxy Horseman, CNP

## 2017-04-30 NOTE — Unmapped (Signed)
KUB today shows significant stool burden throughout colon.  Recommend Golytely 4L cleanse, may need second cleanse.  Then start Miralax 34gm/d with RTO in 4-6 weeks.  Discussed with patient and she is agreeable.

## 2017-05-03 NOTE — Telephone Encounter (Signed)
Returned call to patient.  She completed part of the 4L golytely cleanse Sunday and the rest today.  Advised starting daily miralax to keep bowels moving.

## 2017-05-03 NOTE — Unmapped (Signed)
Pt states that she took the golytely as directed and that she is not completely clear but stool is completely liquid. Pt asking if she is clear enough to start miralax and proceed to eat as normal. Pt can be reached at [phone].

## 2017-05-04 NOTE — Unmapped (Signed)
HPI  Patient comes in to discuss possible IVC filter removal. She had this placed  about one year ago in prep for surgery. She has had several DVTs but no reason  has been established. She is not sure she wants to have it removed. She is on  Eliquis.    ROS  A comprehensive review of systems was done and the only pertinent positive was  history of DVT.    General  Memory: Recent memory intact and Remote memory intact. Mental Status-Pleasant.  General Appearance- Cooperative and Well groomed. Orientation- Oriented X4.  Build & Nutrition- Healthy appearing, Well nourished and Well developed.  Posture- Normal posture. Hydration- Well hydrated.  Voice- Normal.  Skin:  General: Color- Normal color.    Chest and Lung Exam  Inspection: Movements-normal. Accessory muscles- No use of accessory muscles in  breathing.  Auscultation:  Breath sounds: - Normal    Cardiovascular  Inspection: Jugular vein- Left- Inspection normal. Right- Inspection Normal. BP  in 2+  Extremities- See Vitals Section for details  Palpation/Percussion: Heart- PMI in normal location. Abdominal Aorta-Normal  pulsations.  Auscultation: Heart Sounds-Normal heart sounds.  Murmurs & Other Heart Sounds: Auscultation of the heart reveals- No Murmurs.  Carotid arteries- No Bruit heard on Left or bruit heard on right. Abdominal  Aorta- No Bruit.    Abdomen  Inspection:  Ostomies- No ostomy present.  Palpation/Percussion: Palpation and Percussion of the abdomen revel- No rebound  tenderness, No Rigidity (guarding) and No Palpable abdominal masses.  Spleen: Other Characterisitics- No hepatosplenomegaly        Peripheral Vascular  Upper Extremity:   Inspection-Left-Pink nail beds, Pink skin and Rapid capillary refill. No rash  or Digital Clubbing. Right- Pink nail beds, Pink skin and Rapid Capillary  refill. No rash or Digital clubbing.  Palpation: Neuropathy-No neuropathy left or neuropathy right.  Temperature-Left-Normal.  Right-Normal. Brachial pulse-  Left-Normal. Right-Normal. Radial  pulse-Left-Normal. Right-Normal  Edema-Left-No edema. Right- No edema.    Lower Extremity:  Palpation: Neuropathy- No neruopathy left or neuropathy right. Femoral  pulse-Left-Normal.  Right-Normal. Popliteal pulse-Left-Normal. Right-Normal. Dorsalis pedis  pulse-Left-Normal. Right-Normal. Posterior tibial pulse-Left-Normal.  Right-Normal. Edema-Left-No edema. Right-No edema.    Neurologic  Mental Status: Affect- normal. Gait-Normal    Neuropsychiatric Orientation- Oriented X3. The patient's mood and affect are  described as- normal.      Assessment:    ICD-10-CM  1. 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 She absolutely does not  need to have this removed, particularly since no causality has been established  regarding her DVTs. She is reassured this is not unusual to have them remain in  and it is not a problem. Will inform Dr. Selena Batten.

## 2017-05-11 ENCOUNTER — Ambulatory Visit: Admit: 2017-05-11 | Discharge: 2017-05-11 | Payer: PRIVATE HEALTH INSURANCE | Attending: Family

## 2017-05-11 DIAGNOSIS — G4733 Obstructive sleep apnea (adult) (pediatric): Secondary | ICD-10-CM

## 2017-05-11 NOTE — Progress Notes (Signed)
Chief Complaint: OSa on CPAP     Referral made by: Dr. Rico Junker, O[place] .     Patient name: Wanda Rowe   Date of birth: [birth year]  Referring provider: Lynelle Doctor, CNP  Date of study: 10.24.18  Impressions:  1. Obstructive sleep apnea.  AHI 18 per hour.  a. Moderate frequency of obstructive respiratory events.  b. Mild to occasionally severe obstructive respiratory event associated oxyhemoglobin desaturations.  c. Minimal density of moderate or severe oxyhemoglobin desaturation events.  2. Snorer.  3. Mostly supine observation on this study.        Methodology: A Respironics Night 1 recording device was utilized. Recording parameters included airflow, thorax respiratory effort, continuous oxyhemoglobin saturation, heart rate, body position and patient event marker channels. Please see the technical report.  Data interpretation:    Technical quality was adequate for interpretation.  Total monitoring time was 489 minutes which included 435 minutes in the supine position.  Obstructive respiratory events were present.  There were 149 obstructive apneas and hypopneas.  The AHI was 18 per hour.  Obstructive respiratory events occurring in both the supine and nonsupine positions.  Mild to occasionally severe obstructive respiratory event associated oxyhemoglobin desaturations.  Snoring was present.  Baseline oxyhemoglobin saturation was normal at 97 percent.  The oxyhemoglobin saturation nadir for this study was 79 percent.  No cardiac dysrhythmias were identified.                 Georgian Co, M.D.    HPI: Wanda Rowe is a 57 y.o. female with OSA on CPAP. She is doing better with usage when she has the right size pillows. The DMe has not been sending her supplies on time. She calls and then never receives anythinng, has to call back and they say they have sent it. Then for the 3 months supplies they keep telling her she will get a automated call and she never did. She likes the  swift FX uses a med.  She likes her pressure.  She has no rain out or dry mouth. She had  a large leak but that has improved. No snoring, excessive daytime sleepiness and no drowsy driving.   She thinks she can tell when she wakes up her sleep has been better. She may be moving in Sept not sure yet.   She is no longer napping, not waking up as frequently at night and getting more hrs of sleep then before CPAP    Set up date 01/05/2017  Compliance Data Download: 03/08/2017-04/06/2017  Device Model: RESMED S10  Mask: swift fx med  PAP Pressure: min 5 max13 cm H2O average pressure 9.9 cmH2O  Percent Days with Device Usage: 93 %  Percent of Days with usage >= 4 hours: 80 %  Average Usage (number of hours used all days): 45hrs 41 min  AHI: 1.7  Leak average 8.2    Initial hx of anesthesiologist told her she has sleep apnea and needs to be evaluated and she has observed apnea, loud snoring, and snorting. She complains of excessive daytime sleepiness and naps for about 1-2 hrs.  She has no problems falling asleep but is a restless sleeper and wakes up about 1-2 x per night. She wakes up un-refreshed.     Driving while drowsy:  Denies veering to the side of the road,accidents, or near-accidents due to sleepiness while driving    Sleep Schedule  Circadian rhythm disorder due to shift work No  Use sleeping aids No  Thinks  that interfere with sleep  No Heat, No cold, Yes light, No noise, No partner, No kids, Yes pets, No reflux,   No nocturia,  No leg movements, No night sweats, No pain interferes with sleep     Bedtime is 10-11p  on weekdays and same  on weekends. Falls asleep within 10 minutes, with 1-2 nighttime awakenings due too check phone and let the dog put. Falls back asleep in few minutes. Awakens at 7-8 am during the week and 8-9 am on the weekends.     bed partner pets  Patient thinks they get approximately 8 hours of sleep a night.    Nap 0  Patient's desired sleep schedule would be 10p-8a  Pt lives with dogs  Occupation attorney  Divorced 1 child    Sleep Habits       No drink caffeine 6 hours before bedtime   No exercise before bedtime    Yes Watch TV before falling asleep,    Yes use electronic devices before falling asleep or during the night    Unusual sleep activity:   Yes does  remember her dreams. Yes  vivid dreams. Yes chase type dreams.Yes  nightmares. No dream enactment.   No cataplexy,   No sleep paralysis   No hypnagogic or hypnopompic hallucinations   No enuresis    Yes sleep talking    No sleep walking    No sleep eating   Yes bruxism uses a mouth gard  Yes TMJ uses a mouth guard no adjusting   No RLS   Yes kicking while asleep.      No closed head injuries   No Seizures    No clastrophobia  Tonsils have been removed No  Deviated septum  No  Problems with nasal congestion Yes  palpitations yes  Night sweats no  Decreased sex drive no  Dentures no good condition'  Anxiety- yes      EPWORTH SLEEPINESS SCALE: (at or above 10 is abnormal and 24 is the maximum score)  Epworth Sleepiness Scale 12/10/2016 02/04/2017 05/11/2017   Sitting and reading 1 1 1    Watching TV 1 2 1    Sitting, inactive in a public place (e.g. a theatre or a meeting) 1 1 1    As a passenger in a car for an hour without a break 1 1 1    Lying down to rest in the afternoon when circumstances permit 3 3 2    Sitting and talking to someone 0 0 0   Sitting quietly after a lunch without alcohol 0 0 0   In a car, while stopped for a few minutes in traffic 0 0 0   Total score 7 8 6        .Previous Report(s) Reviewed: historical medical records, lab reports and office notes   The following portions of the patient's history were reviewed and updated as appropriate: allergies, current medications, past family history, past medical history, past social history, past surgical history and problem list.    SOCIAL HX:  Social History     Social History    Marital status: Unknown     Spouse name: N/A    Number of children: N/A    Years of education: N/A     Occupational History    Attorney      Social History Main  Topics    Smoking status: Never Smoker    Smokeless tobacco: Never Used      Comment: (06/23/2006)    Alcohol use  Yes      Comment: Occasionally (06/23/2006)    Drug use: No    Sexual activity: Not Asked     Other Topics Concern    Caffeine Use No     rarely    Occupational Exposure No    Exercise Yes    Seat Belt Yes     Social History Narrative    None        Past Medical Hx:   Past Medical History:   Diagnosis Date    Anemia     Anxiety     Asthma     Mild    Breast cancer (CMS Dx)       Right Breast    Diabetes mellitus (CMS Dx)     Type 2   resolved with gastric sleeve    DVT (deep vein thrombosis) in pregnancy (CMS Dx)     GERD (gastroesophageal reflux disease)     Hyperlipidemia     Hypertension     Hypothyroidism     Mitral valve prolapse     Polyp of stomach     RSD (reflex sympathetic dystrophy)     R knee     SurgHx:   Past Surgical History:   Procedure Laterality Date    ANTERIOR CRUCIATE LIGAMENT REPAIR      L     birth mark      benign on buttocks deep    BREAST BIOPSY Left     BREAST SURGERY Right     Lumpectomy chemo and radiation    COLONOSCOPY      COLONOSCOPY W/ POLYPECTOMY      benign    COLPOSCOPY      DENTAL SURGERY      Periodontal     ESOPHAGOGASTRODUODENOSCOPY N/A 07/19/2015    Procedure: ESOPHAGOGASTRODUODENOSCOPY WITH COLONOSCOPY WITH MAC;  Surgeon: Lurene Shadow, MD;  Location: [facility] ENDOSCOPY;  Service: Gastroenterology;  Laterality: N/A;    FOOT SURGERY      gastric sleeve      HYSTERECTOMY      IR IVC FILTER PLACE  05/22/2016    JOINT REPLACEMENT Left     Knee    KNEE SURGERY  05/2016    Left Knee Replacement    MENISCECTOMY      L x 2, R x 1    TYMPANOSTOMY TUBE PLACEMENT      WISDOM TOOTH EXTRACTION       CURRENT MEDICATIONS:   Current Outpatient Prescriptions   Medication Sig    albuterol Inhale 1-2 puffs into the lungs every 6 hours as needed.    apixaban Take 5 mg by mouth 2 times a day.    CALCIUM CITRATE ORAL Take 1 tablet by mouth  daily.      cholecalciferol (vitamin D3) Take 1 capsule by mouth daily.    DULoxetine Take 1 capsule (60 mg total) by mouth daily.    fluticasone propionate Use 2 sprays into each nostril daily.    gabapentin Take 1 tablet (600 mg total) by mouth 3 times a day.    ipratropium USE 2 SPRAYS IN EACH NOSTRIL TWICE DAILY    LACTOBACILLUS ACIDOPHILUS (PROBIOTIC ORAL) Take 1 capsule by mouth daily.    levothyroxine Take 1 tablet (50 mcg total) by mouth every morning before breakfast.    vit b complex w-b 12 Take 1 tablet by mouth daily.    blood sugar diagnostic fsbs qd    blood-glucose meter by Other route 2 (two)  times daily. Use as instructed - fsbs bid     UNABLE TO FIND Med Name: thorne ferrasorb     No current facility-administered medications for this visit.       Outpatient Medications Prior to Visit   Medication Sig Dispense Refill    albuterol (PROVENTIL;VENTOLIN;PROAIR) 90 mcg/actuation inhaler Inhale 1-2 puffs into the lungs every 6 hours as needed. 1 Inhaler 3    apixaban (ELIQUIS) 5 mg Tab Take 5 mg by mouth 2 times a day.      CALCIUM CITRATE ORAL Take 1 tablet by mouth daily.        cholecalciferol, vitamin D3, 2,000 unit Cap Take 1 capsule by mouth daily. 1 capsule 0    DULoxetine (CYMBALTA) 60 MG capsule Take 1 capsule (60 mg total) by mouth daily. 30 capsule 5    fluticasone (FLONASE) 50 mcg/actuation nasal spray Use 2 sprays into each nostril daily. 16 g 5    gabapentin (NEURONTIN) 600 MG tablet Take 1 tablet (600 mg total) by mouth 3 times a day. 270 tablet 5    ipratropium (ATROVENT) 0.03 % nasal spray USE 2 SPRAYS IN EACH NOSTRIL TWICE DAILY 30 mL 0    LACTOBACILLUS ACIDOPHILUS (PROBIOTIC ORAL) Take 1 capsule by mouth daily.      levothyroxine (SYNTHROID, LEVOTHROID) 50 MCG tablet Take 1 tablet (50 mcg total) by mouth every morning before breakfast. 30 tablet 5    vit b complex w-b 12 tablet Take 1 tablet by mouth daily. 30 tablet 0    blood sugar diagnostic (ACCU-CHEK AVIVA  PLUS TEST STRP) Strp fsbs qd 100 strip 1    blood-glucose meter (GLUCOSE MONITORING KIT) kit by Other route 2 (two) times daily. Use as instructed - fsbs bid       UNABLE TO FIND Med Name: thorne ferrasorb 1 capsule 0     No facility-administered medications prior to visit.      Allergies as of 05/11/2017 - Fully Reviewed 05/11/2017   Allergen Reaction Noted    Succinylcholine Hives 01/24/2013    Zithromax [azithromycin]      Silver Rash 01/24/2013    Sulfa (sulfonamide antibiotics) Swelling and Rash     Sulfanilamide Rash 01/24/2013     Family Hx:   Family History   Problem Relation Age of Onset    Hypertension Mother     Other Mother         DDD    Diabetes Father         Type 2    Hypertension Father     Factor VIII deficiency Father     Other Brother         Back Pain    Diabetes Maternal Grandmother         Type 2    Cancer Maternal Grandmother         Bladder CA    Stroke Maternal Grandmother     Colon Cancer Maternal Grandfather     Breast Cancer Paternal Grandmother        Review of Systems  General ROS: negative  Ophthalmic ROS: positive for - uses glasses   Ears, nose, mouth, throat, and face ROS: negative  Respiratory ROS: wheezing  Cardiovascular ROS: negative  Gastrointestinal ROS: diarhea  Genitourinary RUE:AVWUJWJX for nocturia 1 x per night  Integument/breast ROS: negative  Hematologic/lymphatic ROS: negative  Musculoskeletal BJY:NWGNFAOZ for arthralgias, neck pain and stiff joints  Neurological ROS: negative  Psychological ROS: negative  Endocrine ROS: all  negative  Allergy and Immunology ROS: positive for - see list      Denies: suicidal ideas, irritability, depression, racing mind, changes in appetite, fainting spells, alcoholism, tremors, using illegal drugs, worrying about inability to sleep, insomnia, shortness of breath,  lower extremity edema,    I have reviewed the review of system and advised patient to contact primary care physician or specialist for non sleep related  issues.    ROS  has been reviewed and updated with the patient during this visit.     Objective:     Wt Readings from Last 3 Encounters:   05/11/17 194 lb (88 kg)   04/30/17 197 lb (89.4 kg)   03/10/17 194 lb 3.2 oz (88.1 kg)     BP 120/80   Pulse 72   Resp 16   Ht 5' 7.99 (1.727 m)   Wt 194 lb (88 kg)   SpO2 99%   BMI 29.50 kg/m  personally reviewed    Physical Exam   Constitutional:  alert, and in no apparent distress. Well-developed and well-nourished.   Head: normocephalic and atraumatic.   Ears: appear normal externally bilaterally  Eyes: Conjunctivae are normal. Pupils are equal, round, no scleral icterus, drainage or redness.   Nose: Columella normal.  Negative for nasal congestion. No deviated septum.   Mouth : Mallampati 3. Crowded airway. Oropharynx is clear, moist and pink. Uvula thick and elongated,  Midline. Tonsils unable to visualize. Tongue normal. Dentition good.  Neck: Supple, Grossly normal range of motion. . Trachea midline.    Cardiovascular: normal rate and regular rhythm. Neg murmur.    Pulmonary/Chest: effort normal and breath sounds normal. No stridor. No respiratory distress, no wheezes, no rales.   Abdominal: deferred   Musculoskeletal: Grossly normal range of motion and strength. Normal gait.    Neurological:  alert. Cranial nerves II-XII grossly intact. Strength 5/5 in upper  extremities bilaterally.  No tremor.   Skin: skin is warm and dry. No rash noted.   Psychiatric: normal speech, normal mood and affect, behavior is normal. Judgment and thought content normal.     Labs Reviewed:   Lab Results   Component Value Date    IRON 109 01/05/2017    TIBC 365 04/28/2008    FERRITIN 159 (H) 01/05/2017     Lab Results   Component Value Date    WBC 5.0 01/05/2017    HGB 12.1 01/05/2017    HCT 35.4 01/05/2017    MCV 88 01/05/2017    PLT 283 01/05/2017     Lab Results   Component Value Date    GLUCOSE 98 01/05/2017    BUN 19 01/05/2017    CO2 24 01/05/2017    CREATININE 0.73 01/05/2017     K 4.3 01/05/2017    NA 141 01/05/2017    CL 101 01/05/2017    CALCIUM 9.9 01/05/2017     Lab Results   Component Value Date    HGBA1C 4.9 09/18/2016     Lab Results   Component Value Date    TSH 1.990 01/05/2017    T3TOTAL 93 01/05/2017    T3FREE 2.5 01/18/2014    FREET4 0.97 07/01/2016    THYROIDAB 1 11/10/2013         Assessment     Assessment:    OSA on CPAP. She is doing better with usage when she has the right size pillows. Using her CPAP 93% and 80% greater then 4 hrs. AHI in a nirmal range 1.7/hr. The  DMe has not been sending her supplies on time. She calls and then never receives anythinng, has to call back and they say they have sent it. Then for the 3 months supplies they keep telling her she will get a automated call and she never did. She likes the  swift FX uses a med.  She likes her pressure. She has no rain out or dry mouth. She had  a large leak but that has improved. No snoring, excessive daytime sleepiness and no drowsy driving.   She thinks she can tell when she wakes up her sleep has been better. She is sleeping more hrs, not waking up as much at night and no longer napping since starting CPAP. She may be moving in Sept not sure yet.      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    Body mass index is 29.5 kg/m.     Breast cancer s/p breast surgery chemo and radiation, Anxiety, nerve disorder, mitral valve prolaps, gastric sleeve, GERD, IBS, anemia, hx DVT has filter (blood clots)   Plan:   1. OSA  Reviewed download with pt  Answered questions-mostly about when she gets supplies and DME  Call to get supplies  We will call the DME about her supplies  She will call her insutrance company to see what she is allotted for her supplies   auto CPAP min 5 max 13 heated hose and mask    DME [facility]    Reviewed DME, CPAP, mask policy,  humidification, RAMP, heated tubing, cleaning instructions, and when he can get supplies. Talked about we have 90 days to meet compliance required by the insurance companies. They require you using the device more than 4 hours 70 %. Within the first 90 days or the DME will take away the device per some insurance company rules.  Some insurance companies also require a office visit between day 31 and 90 day. Handouts given. Instructed pt to review before going to the DME, so you know what questions to ask, and make sure the respiratory therapist shows you how to use the features of the device.      2. Patient was educated regarding avoiding driving while sleepy, specifically avoiding long distance or night driving pending successful treatment of this condition. Guidelines for tips to avoid drowsy driving were provided. Pt aware this is their responsibility not to drive drowsy and find another form of transportation.     3. Encouraged weight loss and exercise    Follow up appointment recommended for August           SLEEP QUALITY MEASURES:   Obstructive Sleep Apnea Follow Up Visit:   Adherence to therapy documented at least annually?: Yes    Reassessment of excessive daytime sleepiness?: Yes    Weight / body mass index assessment at visit?: Yes    If body mass index greater than 25 was discussion of weight management discussed? : Yes    Blood pressure assessed at visit? : Yes          Medical Decision Making:   The following items were considered in medical decision making:   Review / order other diagnostic tests/interventions   Risks & Benefits:   Risks, benefits and treatment options discussed with patient.

## 2017-05-11 NOTE — Unmapped (Signed)
Key points to remember  ?? You should wear the device every time you sleep, no matter when you sleep or where you sleep. When you nap or travel or go on vacation you need to use the machine when asleep.   ?? Bring your device, mask, hose, power cord etc to every office visit.   ?? If you sleep without the device your Obstructive Sleep Apnea (OSA) is untreated and your risk of heart attacks and strokes are higher.   ?? Clean the device on a regularly basis to avoid sinus infections and pneumonias   ?? Never travel with water in the reservoir   ?? Replace supplies on a regularly basis (see schedule below). You may need to contact your Durable Medical Equipment company (DME) and request supplies (just like you would medications from a pharmacy).  ?? Alcohol will worsen OSA, Alcohol can decrease your drive to breathe, slowing your breathing and making your breaths shallow. In addition, it may relax the muscles of your throat, which may make it more likely for your upper airway to collapse. REMEMBER TO ALWAYS WEAR YOUR PAP DEVICE WHEN SLEEPING.   ?? Certain medications can also worsen OSA such as some pain medications, anti-anxiety medications, muscle relaxants, testosterone supplementation. Please inform your sleep provider which medications you take on a regular basis.   ?? All patients are advised to make follow up appointments with the provider after they are set up with their PAP device. Most insurance plans now require the patient set up a follow up appointment with the provider 31-90 days after they have been set up with their device and at least once a year thereafter. Discuss these issues with your provider for more details.   ?? Please note when you are provided with a mask you have a manufacturer 30 day guarantee that comes with it. If you do not like that mask for any reason contact your DME and request an exchange. After 30 days you may not be eligible for a new mask for another 3-6 months depending on insurance. Talk  to your DME or sleep provider for more details.   ?? If you are struggling with nasal congestion try to use a nasal sinus rinse available over the counter such as Netti Pot 1 hour before bedtime (you may have to use it daily for a 7-10 days then as needed). You can also try over the counter Flonase or Nasacort 1-2 sprays in each nostril at bedtime.   ?? If you ever have problems with the device or the plan discussed with your sleep provider please contact your provider by phone or using MyChart. Thank you      CLEANING INSTRUCTIONS FOR PAP EQUIPMENT     Keeping your equipment and supplies clean is very important.     REMINDER: Only use DISTILLED WATER in your humidifier, Empty water daily.    DAILY  Mask and tubing:   ?? Wash your face before applying mask  ?? Wash mask and tubing with baby shampoo and warm water.     Humidifier:   ?? Empty water in reservoir  ?? Clean with baby shampoo and warm water  ?? Rinse, then air dry    WEEKLY  Mask and tubing:   ?? Soak your mask and tubing in 1 part vinegar and 3 parts water for 30 minutes. Rinse, and allow to air dry.     Humidifier:   ?? Wash with warm water and baby shampoo  ?? Soak in   1 part vinegar and 3 parts water for 30 minutes  ?? Rinse with warm water and allow to air dry    Machine Exterior:   ?? Wipe with a clean damp cloth    MONTHLY AND/OR AS NEEDED  ?? Reusable foam filters (black filter)- wash in warm water with baby shampoo. Rinse well and dry with paper towel  ?? Disposable felt filter (white filter)- Replace filter every two weeks to once a month    NOTE: If you are having repeated sinus and /or respiratory infections, dirty equipment may be the cause. It may help to clean and disinfect your equipment more frequently    TRAVELING  Always make sure the humidifier is empty when traveling. (including doctor appointments, air travel or long distance driving).  When flying, always carry your PAP device with you as a carry on item, NEVER check it in as baggage. We can  provide you with a letter stating it is a medical device, talk to your provider.    Always make sure you have your mask and tubing with you. You will need appropriate plug adapters when traveling outside the [place].    If travelling by air and you are unable to carry distilled water with you use bottled water (no more than 2 weeks). DO NOT USE TAP WATER.       Equipment Replacement Schedule    To get the most benefit from your PAP therapy, your equipment should be replaced when necessary based on wear and tear. For example, your mask may need to be replaced if you notice it is cracked or the seal is leaking. If your tubing is torn, it needs to be replaced.   If your equipment is showing signs of wear, you may be entitled to replace it. The replacement schedule for Medicare patients is shown below. If you are NOT a Medicare patient, please check with your DME (Durable Medical Supply) provider for your individual insurance policy???s replacement schedule.     Supplies   Medicare Medicaid My Insurance Plan  Mask    1 per 3 m 1 per year _______________  Nasal cushion  2 per m 1 per 6 m _______________  Pillows cushion  2 per m 1per 6 m _______________  Full-face cushion  1 per m 1per 6 m _______________  Headgear   1 per 6 m 1 per year _______________  Water Chamber  1 per 6 m N/A  _______________  Chinstrap   1 per 6 m 1 per 6 m       _______________  Tubing/Hose   1 per 3 m 1 per year _______________  Heater wire tubing  1 per 3 m N/A  _______________  Filter, disposable (white) 2 per m 1 per m _______________  Filter, particle foam (black) 1 per 6 m 4 per year _______________  Therapy device  1 per 5 yrs  ?  _______________    Follow up and compliance goals for Medicare and Medicaid patients   (most private insurances are now following similar rules)  Medicare and Medicaid require patients to follow-up with their provider after they are set up with their PAP equipment. The follow up appointment should be within  31-90 days after receiving their equipment. If patient does not follow up as directed there might be issues with the cost of the device being covered by the insurance. Therefore please ensure you make a follow up appointment with your sleep provider.      Insurance   also requires that the patient meet some compliance goals such as the download should indicate patient has been wearing the device for at least 4 hours per night AND they are using the device for 30 nights in a row.      HUMIDIFICATION feature on your PAP device. Most machines are pre-set for a humidity setting of 3 or 4. You can manually change the humidifier settings on your machine when you turn the machine on. Increase the humidity level one increment at a time.  Respironics device: Turn the on/off button to the left for less humidification or the right for more humidification. Humidification ranges from 1-5. If you have a heated hose talk to your provider or DME who can teach you how to make adjustments.     F&P device: Turn the large dial to the right for higher level and left for lower level. Humidification ranges from 1-7.     ResMed S9 device: Attach the humidifier component to the main unit, turn   the dial and choose the water droplet icon; press the button once. Then turn the dial to adjust your humidity setting. Press button a final time to save the setting. Humidification ranges from 1-8        Resmed S10 Device - This device has an Auto humidification feature.    If you have a heated hose it defaults to Auto Setting. You can change it to           manual setting under My Options. Switch Climate Control from Auto to            Manual. Then you will see two other options pop up. Tube Temperature and Humidity Level. Tube temperature is like your thermostat how hot or cold you want the air to be. It can range from 60-86 degree (most defaults to 81 degrees). Humidity Level is the next option this is the Moisture that is captured from the  distilled water placed in the chamber. It goes from 1-8. Higher the # more moister. SEE THIS VIDEO https://www.youtube.com/watch?v=[email or web address]&sns=em         For example, if you are waking up with a dry mouth, you should turn your humidifier up one number at a time. If you are getting excess condensation (water droplets) in your mask or tubing, you should turn the humidifier down one number at a time. In summer you need less humidity in winter you need more humidity. THIS NUMBER RARELY STAYS THE SAME ALL YEAR LONG. YOU HAVE TO CHANGE IT DEPENDING ON HOW YOU FEEL AND WHAT THE WEATHER IS OUTSIDE.     A prescription will be sent to a DME (medical supply company) covered by your insurance. The DME Company should contact you within 7-10 business days once they receive the prescription. If you have not received a call from the DME Company regarding your device, PLEASE CALL US so we may investigate further.   DROWSY DRIVING TIPS    These suggestions will help prevent you from the risk of drowsy driving.     1. If you feel tired or drowsy don't drive. Sleepiness is a major cause of motor vehicle accidents and accounts for 40% of all fatal crashes reported on the [place] thru[place]. No matter how much you think you can control sleepiness, you can't.     2. Ensure you follow your doctor's advice about the treatment for your sleep disorder. For example, if you have sleep apnea and use CPAP, ensure you use it   fully the night before your trip.     3. Get a good night's sleep before driving. Do not cut yourself short of sleep if you plan a long drive the next day. Get to bed early and do not stay up late packing.     4. Avoid alcohol both the night before your trip and during your trip. Alcohol will disrupt sleep and make you more tired the next day. Sleepiness and alcohol are additive in increasing impairment of your driving ability.     5. Avoid any sedative medications, including sedative antihistamines that are often contained in  cold or allergy medications, the night before you drive as they may have long lasting effects the next day.     6. Travel during non-sleeping hours. Accidents due to sleepiness are more common during the nighttime hours.     7. If sleepy, stop and rest. Drink coffee and walk around. Take a brief nap, lock your car doors and take a nap in your car if you are sleepy. Have a 10-15 minute break after every 2 hours of driving.     8. Drive with a companion. Share the driving. Relax in the back seat until it is your time to share the driving again.     These are only guidelines; please discuss your diagnosis and treatment with your sleep medicine provider. Drive safely.       Sleep Hygiene     Sleep disruption is common, especially during times when you may feel emotionally overwhelmed. Anxieties, relentless replay of the day???s events and heightened emotions may significantly interfere with your sleep. Lack of sleep robs you of needed rest, making management of your illness more difficult.   Bring sleep patterns under control and working at a consistent, stable pattern is very important to illness management. You need your rest. The most common cause of insomnia is a change in your daily routine. For example traveling, change in work hours, disruption of other behaviors (eating, exercise, leisure, etc.), relationship conflicts may cause sleep problems. Paying attention to good sleep hygiene is the most important thing you can do to maintain good sleep.     Do:   1. Go to bed at the same time each day.   2. Get up from bed at the same time each day.   3. Get regular exercise each day, preferably in the morning. There is good evidence that regular exercise improves restful sleep. This includes stretching and aerobic exercise.   4. Get regular exposure to outdoor or bright lights, especially in the late afternoon.   5. Keep the temperature in your bedroom comfortable.   6. Keep the bedroom quiet when sleeping   7. Keep the  bedroom dark enough to facilitate sleep.   8. Use your bed only for sleep and sex.   9. Take medications as directed. It???s often helpful to take prescribed sleeping pills one hour before bedtime, so they are causing drowsiness when you lie down, or 10 hours before getting up to avoid daytime drowsiness.   10. Use a relaxation exercise just before going to sleep, (muscle relaxation, imagery, massage, breathing exercises, warm baths).   11. Keep your feet and hands warm. Wear warm socks and/or mittens or gloves to bed.     Don???t:   1. Exercise just before going to bed.   2. Engage in stimulating activity just before bed, such as playing a competitive game, watching an exciting program on TV or movie, or having   an important discussion with a loved one.   3. Have caffeine in the evening (coffee, many teas, chocolate, sodas, etc). No caffeine after noon.    4. Read or watch TV in bed.   5. Use alcohol to help you sleep.   6. Go to bed too hungry or too full.   7. Take another person???s sleeping pills.   8. Take over-the-counter sleeping pills without your doctor???s knowledge. Tolerance can develop rapidly with these medications. Diphenhydramine (an ingredient commonly found in over-the-counter sleep medications) can have serious side effects for elderly patients.   9. Take daytime naps.   10. Command yourself to go to sleep. This only makes your mind and body more alert.   11. Do not look at the time when you wake up in the middle of the night. Remove the clocks from the bedroom or set your alarm for the morning and have it face the wall.   If you lie in bed awake for more than 20-30 minutes, get up, go to a different room (or different part of the bedroom), and participate in a quiet activity (non-excitable reading or listen to soothing music) then return to bed when you feel sleepy. Do this as many times during the night as needed.

## 2017-05-13 MED ORDER — DULoxetine (CYMBALTA) 60 MG capsule
60 | ORAL_CAPSULE | ORAL | 0 refills | Status: AC
Start: 2017-05-13 — End: 2017-05-25

## 2017-05-25 ENCOUNTER — Ambulatory Visit: Admit: 2017-05-25 | Discharge: 2017-05-25 | Payer: PRIVATE HEALTH INSURANCE

## 2017-05-25 DIAGNOSIS — R059 Cough, unspecified: Secondary | ICD-10-CM

## 2017-05-25 MED ORDER — AMOXicillin-clavulanate (AUGMENTIN) 875-125 mg per tablet
875-125 | ORAL_TABLET | Freq: Two times a day (BID) | ORAL | 0 refills | Status: AC
Start: 2017-05-25 — End: 2017-07-15

## 2017-05-25 MED ORDER — hydrocodone-chlorpheniramine (TUSSIONEX) 10-8 mg/5 mL suspension
10-8 | Freq: Two times a day (BID) | ORAL | 0 refills | 20.00000 days | Status: AC | PRN
Start: 2017-05-25 — End: 2017-06-04

## 2017-05-25 NOTE — Progress Notes (Signed)
[facility]  [facility] PRIMARY [facility]ARE AT [facility]  [address]  [place] [address]    Name:  Wanda Rowe Date of Birth: [birth year] (57 y.o.)   MRN: [id number]    Date of Service:  05/25/2017     Subjective:     Chief Complaint   Patient presents with    Sore Throat     started thursday    Fever     101.7 yesterday     Ear Fullness     ringing/fullness    Cough    Dizziness    Headache    Fatigue     History of Present Illness:  Wanda Rowe is a(n) 57 y.o. female here today for the following:   St fever chills ear pain chest congestion couple productive        Current Outpatient Prescriptions   Medication Sig Dispense Refill    albuterol (PROVENTIL;VENTOLIN;PROAIR) 90 mcg/actuation inhaler Inhale 1-2 puffs into the lungs every 6 hours as needed. 1 Inhaler 3    apixaban (ELIQUIS) 5 mg Tab Take 5 mg by mouth 2 times a day.      blood sugar diagnostic (ACCU-CHEK AVIVA PLUS TEST STRP) Strp fsbs qd 100 strip 1    blood-glucose meter (GLUCOSE MONITORING KIT) kit by Other route 2 (two) times daily. Use as instructed - fsbs bid       CALCIUM CITRATE ORAL Take 1 tablet by mouth daily.        cholecalciferol, vitamin D3, 2,000 unit Cap Take 1 capsule by mouth daily. 1 capsule 0    DULoxetine (CYMBALTA) 60 MG capsule Take 1 capsule (60 mg total) by mouth daily. 30 capsule 5    fluticasone (FLONASE) 50 mcg/actuation nasal spray Use 2 sprays into each nostril daily. 16 g 5    gabapentin (NEURONTIN) 600 MG tablet Take 1 tablet (600 mg total) by mouth 3 times a day. 270 tablet 5    ipratropium (ATROVENT) 0.03 % nasal spray USE 2 SPRAYS IN EACH NOSTRIL TWICE DAILY 30 mL 0    LACTOBACILLUS ACIDOPHILUS (PROBIOTIC ORAL) Take 1 capsule by mouth daily.      levothyroxine (SYNTHROID, LEVOTHROID) 50 MCG tablet Take 1 tablet (50 mcg total) by mouth every morning before breakfast. 30 tablet 5    vit b complex w-b 12 tablet Take 1 tablet by mouth daily. 30 tablet 0    DULoxetine (CYMBALTA) 60 MG capsule  TAKE 1 CAPSULE(60 MG) BY MOUTH DAILY 30 capsule 0    UNABLE TO FIND Med Name: thorne ferrasorb 1 capsule 0     No current facility-administered medications for this visit.       Review of Systems   Constitutional: Positive for fatigue.   All other systems reviewed and are negative.           Objective:     Vitals:    05/25/17 1331   BP: 118/88   Pulse: 91   Resp: 15   Temp: 98.8 F (37.1 C)   SpO2: 98%   Weight: 190 lb (86.2 kg)   Height: 5' 8 (1.727 m)     Body mass index is 28.89 kg/m.    Physical Exam   Constitutional: She appears well-developed and well-nourished.   HENT:   Head: Normocephalic and atraumatic.   Right Ear: A middle ear effusion is present.   Left Ear: A middle ear effusion is present.   Nose: Mucosal edema present.   Mouth/Throat: Oropharyngeal  exudate and posterior oropharyngeal erythema present.   Cardiovascular: Normal rate, regular rhythm and normal heart sounds.    Pulmonary/Chest: Effort normal and breath sounds normal.   Abdominal: Soft. Bowel sounds are normal.   Skin: Skin is warm and dry. No rash noted.   Psychiatric: She has a normal mood and affect. Her behavior is normal.                 Assessment/Plan:                         Portia Wisdom Johnn Hai, MD

## 2017-05-26 NOTE — Unmapped (Signed)
Subjective  Subjective:  Patient ID: Wanda Rowe  Age: 57 y.o. (DOB: [birth year])  Ethnicity: Non-Hispanic  Race: White or Caucasian  Gender: female    Chief Complaint:  Chief Complaint  Patient presents with  ?????? Follow-up    LT TKA 4.24.18, LT Knee Manipulation 6.5.18, L/S 12.31.18          HPI: She is here today for annual follow-up on her left total knee replacement  of May 26, 2016.  The knee was also manipulated on July 07, 2016.  She states  that she is doing well and has no pain.  She does feel like it is stiff at  times.      Ortho Exam: There is no warmth, swelling, erythema or effusion.  Range of motion  is good and painless with no mechanical instability noted.  Ambulating well.  Alert and oriented, neurologically intact.      ROS:          Review of Systems  Constitutional: (-) Fever, (-) Chills  Cardiovascular: (-) Chest Pain  Neurological: (-) Dizziness  Respiratory: (-) SOB, Cough(allergies)  GI: (-) Nausea, (-) Vomiting  Eyes: (-) Blurred Vision  HENT: (-) Headaches      Patient Vitals:  Vitals  Height: 5' 8 (172.7 cm)  Weight: 192 lb (87.1 kg)        X-Ray/Injection In Clinic Procedure Notes    X-Ray/Inj Notes    Author Status Last Editor Updated Created   Ophelia Shoulder., RMA Signed [name] L., RMA 05/26/2017 12:52 PM  05/26/2017 12:52 PM     Assoc. Orders   DIAG-KNEE 3-VIEWS LT          Prosthesis is stable, showing good sizing, positioning and alignment, no signs  of loosening or lucency.                  In Clinic Administered Meds Billing Data  None          Assessment/Impression:    Encounter Diagnoses  Code Name Primary?  ?????? Z47.1, [phone] Aftercare following left knee joint replacement surgery Yes        Plan:    Orders Placed This Encounter  ?????? Xray Clinic - Knee 3 views AP, lateral, sunrise Left  [[id number]]      reviewed her x-rays and advised that her prosthesis is stable.  On examination  she is doing very well.  I advised that stiffness is not abnormal at times.  Continue  with lower extremity strengthening and movement exercises.  Return in 2  years for recheck.  All questions answered and understood.    I, [name], RMA am scribing for, and in the presence of Dr. George Ina,  including xray interpretation if performed.    I, George Ina, MD personally performed the services described in the  documentation as scribed by [name], RMA in my presence, and confirm it  is both accurate and complete.    Please note that documentation was completed using dragon speak and some errors  may have occurred and have not been identified.  Please check the chart for  addendums.  If there are major errors that you have concern about please contact  the office for clarification.

## 2017-05-27 LAB — POCT RAPID STREP A DIRECT PROBE: Rapid Strep A Screen: NEGATIVE

## 2017-05-27 LAB — POCT INFLUENZA A/B
Rapid Influenza A Ag: NEGATIVE
Rapid Influenza B Ag: NEGATIVE

## 2017-05-27 NOTE — Assessment & Plan Note (Signed)
augmentin/tussionex

## 2017-06-07 ENCOUNTER — Other Ambulatory Visit: Admit: 2017-06-07 | Payer: PRIVATE HEALTH INSURANCE

## 2017-06-07 ENCOUNTER — Ambulatory Visit: Admit: 2017-06-07 | Payer: PRIVATE HEALTH INSURANCE | Attending: Gerontology

## 2017-06-07 DIAGNOSIS — R140 Abdominal distension (gaseous): Secondary | ICD-10-CM

## 2017-06-07 LAB — ENDOMYSIAL ANTIBODY, IGA TITER: Endomysial IgA: NEGATIVE

## 2017-06-07 LAB — GLIADIN ANTIBODIES, SERUM
Gliadin IgA: 2 units (ref 0–19)
Gliadin IgG: 2 units (ref 0–19)

## 2017-06-07 LAB — TISSUE TRANSGLUTAMINASE, IGA: Transglutaminase IgA: <2 U/mL (ref 0–3)

## 2017-06-07 LAB — IGA: IgA: 198 mg/dL (ref 82.0–453.0)

## 2017-06-07 NOTE — Unmapped (Signed)
GASTROENTEROLOGY   HISTORY OF PRESENT ILLNESS:    The patient is a 57 y.o. female who I met on 04/30/17 for rectal bleeding.  07/09/15 had surveillance EGD and Colonoscopy with Dr. Mickie Bail (see full results below) for hx of gastric hyperplastic polyp with HGD and hx of colon polyps.  04/30/17 OV She reported hx of IBS primarily diarrhea but after 07/2015 endoscopies, is experiencing constipation. Stool were small balls 3-4 times per day, and inadequate with straining sometimes. Had BRBPR, x3 episodes when wiping. On Eliquis 5mg  BID for hx of blood clots since 08/2016, potentially coming off this.  GYN started Colace BID and Miralax, told she was severely constipated in early 2019 per PCP. Colace not helping. She was nervous to take Miralax as she didn't want diarrhea.  Has increase gas and bloating especially with gluten which also gives her diarrhea.  Was having increased heartburn x1 year. Was not on any antacids as she has a hx of precancerous gastric polyps.  Had Weight gain per patient.   Was on Gabapentin 600mg  TID for 20 years. CBC, CMP 04/19/17 at [facility] is normal, TSH 02/2017 was 0.91, on Synthroid.  At that time suspected BRBPR was hemorrhoidal and GERD, gas/bloating were related to constipation.  We discussed KUB vs empiric bowel cleanse then daily Miralax. She preferred to have KUB.  If her heartburn does not resolve once bowel habits are improved, consider repeat EGD with hx of LA Grade C esophagitis.  Of note- She does not want to take Any antacids with her hx of gastric polyps.  04/30/17 KUB showed significant stool burden throughout colon. Recommended 4L Golytely cleanse, may need second cleanse, then start Miralax 34gm/d with close RTO.    Here today for follow up:  Miralax cleanse results were successful but she had fecal incontinence for 10 days s/p cleanse, therefore she didn't start the daily miralax until recently and takes inconsistently.   Also eating prunes intermittently.  Says she was also sick  with URI following the cleanse and after traveling for work.  Was on antibiotics.  Feels constipated again for last couple weeks.  Currently having 1-2 inadequate BMs daily, formed  No further rectal bleeding  Gas and Bloating are slightly better since cleanse but still intermittent.  Is gluten free for the most part, but not 100%  On daily Probiotic   Heartburn is better since cleanse.  Appetite ok  Weight stable    Procedure History:  Colonoscopy 07/09/15-The entire examined colon is normal. No specimens collected. Repeat colonoscopy in 7 years for surveillance.  EGD 07/09/15- LA Grade C reflux esophagitis. S/p gastric sleeve. No gastric polyps. Use Prilosec (omeprazole) 20 mg PO daily.  ??  GI FamHx:  MGF- Colon Cancer 60s.  ??   Past Medical History:   Diagnosis Date   ??? Anemia    ??? Anxiety    ??? Asthma     Mild   ??? Breast cancer (CMS Dx)       Right Breast   ??? Diabetes mellitus (CMS Dx)     Type 2   resolved with gastric sleeve   ??? DVT (deep vein thrombosis) in pregnancy (CMS Dx)    ??? GERD (gastroesophageal reflux disease)    ??? Hyperlipidemia    ??? Hypertension    ??? Hypothyroidism    ??? Mitral valve prolapse    ??? Polyp of stomach    ??? RSD (reflex sympathetic dystrophy)     R knee  Past Surgical History:   Procedure Laterality Date   ??? ANTERIOR CRUCIATE LIGAMENT REPAIR      L    ??? birth mark      benign on buttocks deep   ??? BREAST BIOPSY Left    ??? BREAST SURGERY Right     Lumpectomy chemo and radiation   ??? COLONOSCOPY     ??? COLONOSCOPY W/ POLYPECTOMY      benign   ??? COLPOSCOPY     ??? DENTAL SURGERY      Periodontal    ??? ESOPHAGOGASTRODUODENOSCOPY N/A 07/19/2015    Procedure: ESOPHAGOGASTRODUODENOSCOPY WITH COLONOSCOPY WITH MAC;  Surgeon: Lurene Shadow, MD;  Location: [facility] ENDOSCOPY;  Service: Gastroenterology;  Laterality: N/A;   ??? FOOT SURGERY     ??? gastric sleeve     ??? HYSTERECTOMY     ??? IR IVC FILTER PLACE  05/22/2016   ??? JOINT REPLACEMENT Left     Knee   ??? KNEE SURGERY  05/2016    Left Knee Replacement   ???  MENISCECTOMY      L x 2, R x 1   ??? TYMPANOSTOMY TUBE PLACEMENT     ??? WISDOM TOOTH EXTRACTION       Family History   Problem Relation Age of Onset   ??? Hypertension Mother    ??? Other Mother         DDD   ??? Diabetes Father         Type 2   ??? Hypertension Father    ??? Factor VIII deficiency Father    ??? Other Brother         Back Pain   ??? Diabetes Maternal Grandmother         Type 2   ??? Cancer Maternal Grandmother         Bladder CA   ??? Stroke Maternal Grandmother    ??? Colon Cancer Maternal Grandfather    ??? Breast Cancer Paternal Grandmother      Social History     Social History   ??? Marital status: Unknown     Spouse name: N/A   ??? Number of children: N/A   ??? 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 Smoking status: Never Smoker   ??? Smokeless tobacco: Never Used      Comment: (06/23/2006)   ??? Alcohol use Yes      Comment: Occasionally (06/23/2006)   ??? Drug use: No   ??? Sexual activity: Not on file     Other Topics Concern   ??? Caffeine Use No     rarely   ??? Occupational Exposure No   ??? Exercise Yes   ??? Seat Belt Yes     Social History Narrative   ??? No narrative on file     REVIEW OF SYSTEMS:    See pertinent positives in HPI    PHYSICAL EXAM:    General: Normal appearance, normal affect.    Head: Normocephalic, no lesions.    Eyes: Sclera normal color. Normal hydration.    Ears: Normal.    Nose: Mucosa normal, no sinus tenderness.    Neck: No masses, no thyromegaly, no adenopathy, no bruits.    Chest: Lungs clear, no rales, no rhonchi, no wheezes.    Heart: Regular rhythm, no murmurs, no rubs, no gallops.    Abdomen: Soft, no tenderness, no masses, no  enlargement of the liver or spleen, normal peristaltic sounds.    Extremities: No deformities, no edema, no erythema, normal peripheral pulses.    Neuro: Normal mental status, normal tendon reflexes, normal gait       IMPRESSION/RECOMMENDATIONS:    88 y/o woman who I met in 04/2017 for rectal bleeding and constipation  since 07/2015.  Is on Gabapentin x20 years, and Synthroid.  Has hx of total abdominal hysterectomy, gastric sleeve surgery, precancerous gastric polyps, and LA grade C esophagitis.  EGD 07/2015 (Dr. Mickie Bail) showed LA grade C esophagitis, no gastric polyps.  Had normal colonoscopy 07/2015 (Dr. Mickie Bail), due for surveillance in 7 years (07/2022).  MGF had Colon Cancer in his 55s.  Says she has a hx of IBS-D for many years until 07/2015 when constipation started.  At initial OV 04/2017 was having 3-4 small inadequate balls of stool daily with straining and BRBPR when wiping x3 occurrences.  GYN started Colace BID and suggested Miralax.  She didn't start Miralax as she was nervous to have diarrhea again.  Was also having heartburn, bloating, gassiness, and wt gain.  No CP, SOB, presyncope, or fatigue.  04/19/17 CBC and BMP normal ([facility]).  At that time suspected BRBPR was hemorrhoidal and other symptoms were related to constipation.  We discussed KUB vs empiric bowel cleanse then daily Miralax.  She preferred a KUB which shows significant stool and some gas throughout colo.  Was advised 4L Golytely cleanse then daily Miralax.  Of note- She does not want to take Any antacids with her hx of gastric polyps.    Here today for follow up and reports improvements in gas and bloating.  Heartburn and rectal bleeding resolved.  Had 10 days of fecal incont s/p cleanse.  Taking Miralax inconsistently.  Having 1-2 formed BMs/d now, inadequate.    Recommendations:  - If her heartburn returns and daily laxation is adequate, consider repeat EGD with hx of LA Grade C esophagitis.  - start Fibercon tablets daily and stop miralax  -start Low FODMAP Diet for gas/bloating  - continue probiotic   - ordered CD labs    RTO 3 months     Electronically signed by Roxy Horseman, CNP

## 2017-06-07 NOTE — Unmapped (Signed)
-   Please have Celiac labs completed  - Please start the Low FODMAP diet  - Please start Fibercon tablets 1-2 tabs daily

## 2017-06-15 MED ORDER — levoFLOXacin (LEVAQUIN) 500 MG tablet
500 | ORAL_TABLET | Freq: Every day | ORAL | 0 refills | Status: AC
Start: 2017-06-15 — End: 2017-07-15

## 2017-06-15 NOTE — Telephone Encounter (Signed)
Pt informed

## 2017-06-15 NOTE — Telephone Encounter (Signed)
Pt was seen 4/23 and treated for URI - she completed Augmentin for 10 days. She got better but still did not feel 100%. Now she is having symptoms again. Rt ear is starting to hurt again and she feels sinus pressure. Drainage as well. She is having a hard time fighting this off.  She is leaving for vacation Sunday, can you call in more medication?      [employer]

## 2017-06-17 MED ORDER — DULoxetine (CYMBALTA) 60 MG capsule
60 | ORAL_CAPSULE | ORAL | 0 refills | Status: AC
Start: 2017-06-17 — End: 2017-07-15

## 2017-06-28 NOTE — Unmapped (Signed)
This is a notification of a Discharge Alert generated by [employer].     This patient visited the following location: [facility]  Admit Date: 06/28/2017 15:00  Discharge Date: 06/28/2017 17:05  Visit Type: Emergency  Chief complaint: Effusion, right knee (M25.461)  Diagnosis: Effusion, right knee (M25.461)  Alert Category:   Attending Physician: [facility], DAVID  Referring Physician:   Consulting Physician:   Copied Physician(s):     [employer] is a not-for-profit corporation that was founded in 1997 as a   community effort to enhance the ability to share health information   electronically in the [place] area. Today, [employer]   is one of the nation????????s largest and most financially sustainable regional   health information exchange (HIE) organizations.

## 2017-06-28 NOTE — Unmapped (Signed)
This is a notification of an ED/Admission Alert generated by [employer].     This patient visited the following location: [facility] ([facility]  Admit Date: 06/28/2017 15:00  Visit Type: Emergency  Chief complaint:   Diagnosis:  ()  Alert Category:   Attending Physician:   Referring Physician:   Consulting Physician:   Copied Physician(s):     [employer] is a not-for-profit corporation that was founded in 1997 as a   community effort to enhance the ability to share health information   electronically in the [place] area. Today, [employer]   is one of the nation????????s largest and most financially sustainable regional   health information exchange (HIE) organizations.

## 2017-06-28 NOTE — Unmapped (Signed)
[facility]  Department of Emergency Medicine  Chief Complaint:  Chief Complaint  Patient presents with  ?????? Other    R knee swelling    History of Present Illness:  Wanda Rowe is a 57 y.o. female with a PMHx of DVT who presents with the  swelling.  Patient reports that she has had 3 days of right knee swelling.  She  is having pain with ambulation.  It is on the front of her knee.  She does  report having fevers 2 days ago.  She denies nausea or vomiting.  She denies any  focal weakness, numbness, or tingling.  She is currently on Eliquis.  She has  not missed any doses recently.  She denies any trauma.  She spoke with the  hematologist to recommended she come in for rule out DVT and evaluation for  hematoma.          Review of Systems:  Review of Systems  Constitutional: Positive for fever. Negative for chills.  Gastrointestinal: Negative for nausea and vomiting.  Musculoskeletal: Positive for joint pain. Negative for falls.  Neurological: Negative for sensory change and focal weakness.    All other systems reviewed and negative    Allergies:  Allergies  Allergen Reactions  ?????? Azithromycin    **DOES NOT WORK**    ?????? Succinylcholine Other (See Comments)    Other reaction(s): Muscle Aches  Delayed paralysis  Difficulty w/ muscle movement the day after surgery  Other reaction(s): Muscle Aches  Difficulty w/ muscle movement the day after surgery  ?????? Sulfasalazine Rash  ?????? Tegaderm Rash  ?????? Silver Rash  ?????? Sulfa (Sulfonamide Antibiotics) Swelling and Rash  ?????? Sulfanilamide Rash    Sulfonamides    Past Medical History:  Past Medical History:  Diagnosis Date  ?????? Anemia   in past  ?????? Anxiety  ?????? Arthritis   knees  ?????? Asthma  ?????? Awareness under anesthesia   woke up during endoscopy  ?????? Back problem   occ back ache  ?????? Cancer (CMS HCC)   right breast- chemo. and radiation in 2014 & 2015, hx of Lumpectomy  ?????? Chest pain   when under stress-testing done & no cause of chest pain was found  ??????  Complication of anesthesia   rxn to succinylcholine-day after surgery, pt  had difficulty w/ muscle  movement-dr j collins in anesthesia dept notified- Informed Dr. Unknown Foley, AAC  for upcoming procedure on 07/07/16- UPDATE- Notified Dr. Regino Schultze, AAC for upcoming  procedure on 03/20/17- states to document and inform surgical booking to add to  case comments  ?????? Dysplasia  ?????? GERD (gastroesophageal reflux disease)   no meds  ?????? Hernia   hiatal-resolved with wt loss surg  ?????? Hypertension   in past & no longer taking medication  ?????? Irregular heart beat   MVP & heart murmur-used to take pre-dental antibiotics  ?????? Liver disorder   pt told she has a fatty liver-resolved per pt  ?????? Mobility impaired 07/01/2016   uses cane currently d/t recent s/p left knee replacement on 05/26/16  ?????? Occlusive thrombus 2017   dvts in past; IVC filter inserted 05/22/16  ?????? Osteopenia  ?????? Recurrent UTI 03/10/2017  ?????? RSD (reflex sympathetic dystrophy)  ?????? Sleep apnea   USES CPAP, states she stopped breathing after given a medication possibly a  sedative  ?????? Suspected sleep apnea 07/01/2016   pt. states prior to knee replacement when she was given preop meds, she heard  the nurses calling her name and states that something had dropped suddenly: pt.  thinks it may have been her 46. She states she was advised to get a sleep study  in the future. pt. is not sure what med caused this to happen prior to  surgery  on 05/26/16.  ?????? Thyroid disorder   hypothyroidism  ?????? Type 2 diabetes mellitus (CMS HCC)   PT. STATES USED TO BE DIABETIC NO LONGER ON MEDS D/T WEIGHT LOSS  ?????? Venous thrombosis and embolism    Past Surgical History:  Past Surgical History:  Procedure Laterality Date  ?????? CYSTOURETHROSCOPY,URETER CATHETER Bilateral 03/19/2017   CYSTOSCOPY WITH CATHETERIZATION URETER performed by Mack Hook., MD at [facility]  OR  ?????? HX BREAST BIOPSY  july 2014   right  ?????? HX BREAST LUMPECTOMY  02-2013   right; also bilat reduction; back to surg  next day for post op bleeding  ?????? HX COLPOSCOPY  ?????? HX FOOT SURGERY   right  ?????? HX GUM SURGERY   done for receding gums  ?????? HX HYSTERECTOMY, TOTAL ABDOMINAL  ?????? HX KNEE SURGERY   left x 3, right x 1  ?????? HX LAPAROSCOPY  ?????? HX OTHER SURGICAL HISTORY  jan 2014   gastric sleeve  ?????? HX OTHER SURGICAL HISTORY   ins. portacath  ?????? HX TOTAL KNEE ARTHROPLASTY Left 05/26/2016  ?????? HX UPPER GI ENDOSCOPY   mult in past due to stomach polyps  ?????? HX VAGINAL DELIVERY  ?????? HX VASCULAR SURGERY  05/22/2016   IVC filter  ?????? REMOVAL OF OVARY/TUBE(S)   bilateral salpingo-oophorectomy    Social History:  Social History    Tobacco Use  ?????? Smoking status: Never Smoker  ?????? Smokeless tobacco: Never Used  Substance Use Topics  ?????? Alcohol use: Yes    Comment: very occasionally    Family History:  Family History  Problem Relation Name Age of Onset  ?????? High Blood Pressure Mother  ?????? Diabetes Father  ?????? High Blood Pressure Father  ?????? Heart Problems Paternal Grandfather       mi  ?????? Cancer Other       unspecified grandmother  ?????? Diabetes Other       unspecified grandmother  ?????? Stroke Other       unspecified grandmother  ?????? Colon Cancer Maternal Grandfather  60  ?????? Cancer Maternal Grandmother  60       Bladder  ?????? Anesthesia Complications Neg Hx    Patient's allergies, past medical history, past surgical history, social  history, and family history reviewed and updated as appropriate.  Home Medications:  Prior to Admission medications  Medication Sig Start Date End Date Taking? Authorizing Provider  albuterol (PROVENTIL HFA) 90 mcg/actuation HFA Aerosol Inhaler Take 2 Puffs by  inhalation every 6 hours as needed.    Provider, Historical  apixaban (ELIQUIS) 5 mg Tablet Take 5 mg by mouth 2 times daily.    Provider,  Historical  CALCIUM CITRATE PO Take  by mouth daily.    Provider, Historical  CHOLECALCIFEROL, VITAMIN D3, PO Take  by mouth.    Provider, Historical  cyanocobalamin 100 mcg Tablet Take 100 mcg by mouth daily.     Provider,  Historical  docusate sodium (COLACE) 100 mg capsule Take 100 mg by mouth 2 times daily.  Provider, Historical  DULoxetine (CYMBALTA) 60 mg Capsule, Delayed Release(E.C.) Take 60 mg by mouth  daily. Reported on 07/24/2015    Provider, Historical  fluticasone (FLONASE) 50 mcg/Actuation  NA nasal spray Spray 2 Sprays into nose  daily. Each nostril    Provider, Historical  gabapentin (NEURONTIN) 600 mg tablet Take 600 mg by mouth 2 times daily.  Provider, Historical  ipratropium (ATROVENT) 0.03 % NA nasal spray Spray 2 Sprays into nose 2 times  daily. Each nostril     Provider, Historical  lactobacillus rhamnosus (CULTURELLE) 10 billion cell Take 1 Cap by mouth daily.  Provider, Historical  levothyroxine (SYNTHROID) 75 mcg PO tablet Take 66 mcg by mouth daily.  Provider, Historical    Physical Exam:  All nurse's notes reviewed.  ED Triage Vitals [06/28/17 1505]  Enc Vitals Group     Blood Pressure (cuff) 124/70     Heart Rate 87     Respirations 18     Temp 98.6 ??????F (37 ??????C)     Temp Source Oral     SpO2 100 %     Weight 193 lb (87.5 kg)     Height 5' 8 (1.727 m)     Head Circumference     Peak Flow     Pain Score     Pain Loc     Pain Edu?     Excl. in GC?    Physical Exam  Constitutional: She is oriented to person, place, and time. She appears  well-developed and well-nourished. No distress.  HENT:  Head: Normocephalic and atraumatic.  Mouth/Throat: Oropharynx is clear and moist.  Eyes: Conjunctivae are normal. No scleral icterus.  Neck: Normal range of motion. Neck supple. No tracheal deviation present.  Cardiovascular: Normal rate and regular rhythm.  Pulmonary/Chest: Effort normal and breath sounds normal. No respiratory  distress.  Abdominal: Soft. She exhibits no distension. There is no tenderness.  Musculoskeletal: She exhibits no deformity.       Right knee: She exhibits decreased range of motion, swelling and effusion.  She exhibits no erythema. Tenderness found. Lateral joint line tenderness  noted.  Neurological: She is alert and oriented to person, place, and time. GCS eye  subscore is 4. GCS verbal subscore is 5. GCS motor subscore is 6.  Skin: Skin is warm and dry.  Psychiatric: She has a normal mood and affect. Her speech is normal. Thought  content normal.      Orders, Abnormal Labs and Imaging Results:  Labs Reviewed  BASIC METABOLIC PANEL (BMP=EP1) - Abnormal; Notable for the following  components:      Result Value   Glucose 159 (*)   All other components within normal limits  ERYTHROCYTE SEDIMENTATION RATE - Abnormal; Notable for the following components:   Sed Rate 39 (*)   All other components within normal limits  C-REACTIVE PROTEIN (CRP) - Abnormal; Notable for the following components:   CRP 61.9 (*)   All other components within normal limits  URIC ACID, SERUM  CBC (COMPLETE BLOOD COUNT)  DIFFERENTIAL  D-DIMER    DIAG-KNEE 3-VIEWS RT  Final Result by Interface, Incoming Radiology Results (05/27 1616)  Impression:  No acute osseous injury.  Joint effusion.  Mild to moderate osteoarthritis.  Chondrocalcinosis.    Signed By: Nicholes Calamity MD, John      All imaging was personally reviewed on the PACS system.    EKG: No results found for this visit on 06/28/17.  MDM:  Patient presents with right knee swelling.  Patient was evaluated with history,  physical examination, imaging, and labs.  X-ray showed a injury knee effusion.  The patient is a poor candidate for a bedside shows  TS is given Eliquis use.  She is afebrile here.  She only had mild elevations an ESR and CRP within normal  white blood cell count.  Patient was discussed with Dr. Jaynie Collins, orthopedic surgery.  After extensive discussion, the patient will be started on steroids.  She will  follow up in orthopedic clinic tomorrow.  She was also scheduled for an  outpatient lower ext duplex to rule out DVT.  Patient was in agreement with this  plan.    Procedures    Consults: ortho-Lim  Impression:  Final diagnoses:  Effusion of bursa of right  knee  Acute pain of right knee    Disposition:  Following the above history, physical exam, and studies, the patient was deemed  stable and suitable for discharge.  Follow Up:  Fawn Kirk., MD  [address]  [id number]  [place] [address]  [phone]    Call in 1 day      Prescriptions:  Discharge Medication List as of 06/28/2017  4:44 PM    START taking these medications   Details  Hydrocodone-Acetaminophen (NORCO) 5-325 mg per tablet Take 1 Tab by mouth every  6 hours as needed for up to 5 days.Disp-10 Tab, R-0, PrintFor acute pain  prescriptions - order no greater than 7 days (adult) or 5 days (minor) unless  justification is documented in chart. Note that [place] limits acute opioid pres  criptions to 3 days.    methylPREDNISolone (MEDROL) 4 mg tab (dosepak) follow package directionsDisp-1  Dosepak, R-0, Print        It was advised that the patient return to the ED if they develop new or any  other concerning symptoms and follow up as directed.  The patient's family verbalized understanding of all instructions and had no  further questions or concerns.    Teena Irani. Domenic Polite, MD  06/28/2017, 3:26 PM

## 2017-06-29 NOTE — Unmapped (Signed)
Wanda Bateman, MD  Orthopaedic Surgery and Sports Medicine      PATIENT: Wanda Rowe  57 y.o.  female  DATE OF BIRTH: [birth year]  MRN:  [id number]    Date of current encounter: 06/29/2017  This encounter is evaluated as a:    [x]  New Patient Visit    []  Established Patient Visit   []  Post-Op Visit     []  Consult: requested by     []  Worker's Comp    Patient's PCP is Dr. Rico Junker, Grayland Ormond., MD    Referred by: Dr George Ina    Subjective:    Chief Complaint  Patient presents with  ?????? Knee Pain    op Jaynie Collins, np Palmer rt knee, hx tka lt knee      HPI:  Wanda Rowe is a 57 y.o. year old female complaining of right knee pain. She  has had the problem for since Friday. She was on vacation at the beach when she  noticed increased swelling on her right knee. She had pain with bending her knee  and ambulating. She returned home Sunday and went to the ER Monday for her pain.  She had a doppler done this morning which was negative.    Patient has history of left TKA with Dr. Jaynie Collins. There was no effusion incident of  left knee.    Patient also has history of multiple DVT and is on blood thinners.    Patient has just finished a prescription of Levaquin for her upper respiratory  infection when the swelling occurring.    PAIN ASSESSMENT:  Orthopedic History  Body Part: Knee  Side: Right  Symptoms Began: A Few Days Ago  Pain Scale (1-10): 7  Pain Description/Symptoms: Aching, Spasm, Sore, Dull, Shooting, Sharp  Injury: No  Risk of falling: No  History of falling: No  Assistive Devices needed: Crutches  ADL Deficiencies: Standing, Walking  Prior Treatment: NSAIDs(steoird started yesterday is helping)  Prior Treatments Failed: No  Greater than 101 years of age: No    Past Medical History:  Past Medical History:  Diagnosis Date  ?????? Anemia   in past  ?????? Anxiety  ?????? Arthritis   knees  ?????? Asthma  ?????? Awareness under anesthesia   woke up during endoscopy  ?????? Back problem   occ back ache  ?????? Cancer (CMS HCC)   right breast- chemo.  and radiation in 2014 & 2015, hx of Lumpectomy  ?????? Chest pain   when under stress-testing done & no cause of chest pain was found  ?????? Complication of anesthesia   rxn to succinylcholine-day after surgery, pt  had difficulty w/ muscle  movement-dr j collins in anesthesia dept notified- Informed Dr. Unknown Foley, AAC  for upcoming procedure on 07/07/16- UPDATE- Notified Dr. Regino Schultze, AAC for upcoming  procedure on 03/20/17- states to document and inform surgical booking to add to  case comments  ?????? Dysplasia  ?????? GERD (gastroesophageal reflux disease)   no meds  ?????? Hernia   hiatal-resolved with wt loss surg  ?????? Hypertension   in past & no longer taking medication  ?????? Irregular heart beat   MVP & heart murmur-used to take pre-dental antibiotics  ?????? Liver disorder   pt told she has a fatty liver-resolved per pt  ?????? Mobility impaired 07/01/2016   uses cane currently d/t recent s/p left knee replacement on 05/26/16  ?????? Occlusive thrombus 2017   dvts in past; IVC filter inserted 05/22/16  ?????? Osteopenia  ??????  Recurrent UTI 03/10/2017  ?????? RSD (reflex sympathetic dystrophy)  ?????? Sleep apnea   USES CPAP, states she stopped breathing after given a medication possibly a  sedative  ?????? Suspected sleep apnea 07/01/2016   pt. states prior to knee replacement when she was given preop meds, she heard  the nurses calling her name and states that something had dropped suddenly: pt.  thinks it may have been her 02. She states she was advised to get a sleep study  in the future. pt. is not sure what med caused this to happen prior to  surgery  on 05/26/16.  ?????? Thyroid disorder   hypothyroidism  ?????? Type 2 diabetes mellitus (CMS HCC)   PT. STATES USED TO BE DIABETIC NO LONGER ON MEDS D/T WEIGHT LOSS  ?????? Venous thrombosis and embolism    Past Surgical History:  Procedure Laterality Date  ?????? CYSTOURETHROSCOPY,URETER CATHETER Bilateral 03/19/2017   CYSTOSCOPY WITH CATHETERIZATION URETER performed by Mack Hook., MD at [facility]  OR  ?????? HX  BREAST BIOPSY  july 2014   right  ?????? HX BREAST LUMPECTOMY  02-2013   right; also bilat reduction; back to surg next day for post op bleeding  ?????? HX COLPOSCOPY  ?????? HX FOOT SURGERY   right  ?????? HX GUM SURGERY   done for receding gums  ?????? HX HYSTERECTOMY, TOTAL ABDOMINAL  ?????? HX KNEE SURGERY   left x 3, right x 1  ?????? HX LAPAROSCOPY  ?????? HX OTHER SURGICAL HISTORY  jan 2014   gastric sleeve  ?????? HX OTHER SURGICAL HISTORY   ins. portacath  ?????? HX TOTAL KNEE ARTHROPLASTY Left 05/26/2016  ?????? HX UPPER GI ENDOSCOPY   mult in past due to stomach polyps  ?????? HX VAGINAL DELIVERY  ?????? HX VASCULAR SURGERY  05/22/2016   IVC filter  ?????? REMOVAL OF OVARY/TUBE(S)   bilateral salpingo-oophorectomy    Social History    Socioeconomic History  ?????? Marital status: Single    Spouse name: Not on file  ?????? Number of children: Not on file  ?????? Years of education: Not on file  ?????? Highest education level: Not on file  Occupational History  ?????? Not on file  Social Needs  ?????? Financial resource strain: Not on file  ?????? Food insecurity:    Worry: Not on file    Inability: Not on file  ?????? Transportation needs:    Medical: Not on file    Non-medical: Not on file  Tobacco Use  ?????? Smoking status: Never Smoker  ?????? Smokeless tobacco: Never Used  Substance and Sexual Activity  ?????? Alcohol use: Yes    Comment: very occasionally  ?????? Drug use: No  ?????? Sexual activity: Yes    Partners: Male  Lifestyle  ?????? Physical activity:    Days per week: Not on file    Minutes per session: Not on file  ?????? Stress: Not on file  Relationships  ?????? Social connections:    Talks on phone: Not on file    Gets together: Not on file    Attends religious service: Not on file    Active member of club or organization: Not on file    Attends meetings of clubs or organizations: Not on file    Relationship status: Not on file  ?????? Intimate partner violence:    Fear of current or ex partner: Not on file    Emotionally abused: Not on file    Physically abused: Not on file  Forced sexual activity: Not on file  Other Topics Concern  ?????? Not on file  Social History Narrative  ?????? Not on file    Family History  Problem Relation Name Age of Onset  ?????? High Blood Pressure Mother  ?????? Diabetes Father  ?????? High Blood Pressure Father  ?????? Heart Problems Paternal Grandfather       mi  ?????? Cancer Other       unspecified grandmother  ?????? Diabetes Other       unspecified grandmother  ?????? Stroke Other       unspecified grandmother  ?????? Colon Cancer Maternal Grandfather  60  ?????? Cancer Maternal Grandmother  60       Bladder  ?????? Anesthesia Complications Neg Hx      Allergies:  Azithromycin; Succinylcholine; Sulfasalazine; Tegaderm; Silver; Sulfa  (sulfonamide antibiotics); and Sulfanilamide    Medications:  Current Outpatient Medications  Medication Sig Dispense Refill  ?????? albuterol (PROVENTIL HFA) 90 mcg/actuation HFA Aerosol Inhaler Take 2 Puffs by  inhalation every 6 hours as needed.  ?????? apixaban (ELIQUIS) 5 mg Tablet Take 5 mg by mouth 2 times daily.  ?????? CALCIUM CITRATE PO Take  by mouth daily.  ?????? CHOLECALCIFEROL, VITAMIN D3, PO Take  by mouth.  ?????? cyanocobalamin 100 mcg Tablet Take 100 mcg by mouth daily.  ?????? docusate sodium (COLACE) 100 mg capsule Take 100 mg by mouth 2 times daily.  ?????? DULoxetine (CYMBALTA) 60 mg Capsule, Delayed Release(E.C.) Take 60 mg by mouth  daily. Reported on 07/24/2015  ?????? fluticasone (FLONASE) 50 mcg/Actuation NA nasal spray Spray 2 Sprays into nose  daily. Each nostril  ?????? gabapentin (NEURONTIN) 600 mg tablet Take 600 mg by mouth 2 times daily.  ?????? Hydrocodone-Acetaminophen (NORCO) 5-325 mg per tablet Take 1 Tab by mouth  every 6 hours as needed for up to 5 days. 10 Tab 0  ?????? ipratropium (ATROVENT) 0.03 % NA nasal spray Spray 2 Sprays into nose 2 times  daily. Each nostril  ?????? lactobacillus rhamnosus (CULTURELLE) 10 billion cell Take 1 Cap by mouth  daily.  ?????? levothyroxine (SYNTHROID) 75 mcg PO tablet Take 66 mcg by mouth daily.  ?????? methylPREDNISolone  (MEDROL) 4 mg tab (dosepak) follow package directions 1  Dosepak 0    No current facility-administered medications for this visit.      Review of Systems:  Jerelle Virden S Tandon's reported review of systems has been reviewed and has been  scanned into her medical record for today's visit.  The scanned image can be  found in media images folder.  She was instructed to contact her primary care  physician regarding ROS positives if not already being addressed during today's  visit.    Objective:  Physical Exam  Vital Signs:    There is no height or weight on file to calculate BMI.    Constitution:  Generally, Alf Doyle S Hoffmeier is [x]  alert, [x]  appears stated age, and [x]  in no  distress.  Her general body habitus is []  Cachectic  []  Thin  [x]  Normal  []  Obese  []   Morbidly Obese    Head:  [x]  Normocephalic  Eyes:  [x]  Extra-occular muscles intact  Left Ear:   [x]  External Ear normal  Right Ear: [x]  External Ear normal  Nose:   [x]  Normal  Mouth:   [x]  Oral mucosa moist  [x]  No perioral lesions    Pulm:  [x]  Respirations unlabored and regular  Card:  [x]  Regular  []  Irregular  Skin:  [x]  Warm [x]  Well perfused    Psychiatric:  [x]  Good judgement and insight  [x]  Oriented to [x]  person, [x]  place, and [x]  time  [x]  Mood appropriate for circumstances    Posture:   [x]  Normal  []  Slouched / flexible  []  Round Back / Fixed forward    Gait:  Gait is []  Normal  [x]  Impaired Patient came into office ambulating with  crutches.  Assistive Device: []  None  []  Knee Brace  []  Cane  [x]  Crutches   []  Walker  [x]  Wheelchair  []  Other    ORTHOPAEDIC KNEE EXAM: right  Inspection:  [x]  Skin intact without abrasion or lacerations.  []  Ecchymosis:  [x]  none  []  mild  []  moderate  []  severe  []  Atrophy:  [x]  none  []  mild  []  moderate  []  severe  [x]  Effusion: []  none  []  mild  []  moderate  [x]  severe  [x]  Scar / Surgical incision(s): [x]  A-Scope Portals Concurrent with history of  multiple knee scopes.  []  Open Surgical  Incision(s)    Alignment:  [x]  Normal []  Varus []  Valgus    Range of Motion:  [x]  Flexion: 90 / Extension -15 / Hyperextension -  []  Deferred: acute injury/post-surgery/pain  Palpation:  []  No Tenderness  [x]  Tenderness: general global tenderness  []  Patellofemoral Crepitation:  [x]  none  []  mild  []  moderate  []  severe  []  Baker's Cyst:  [x]  none  []  small  []  medium  []  large  []  Homan's Sign: [x]  negative  []  positive    Provocative Tests:  [x]  Negative:  Positive Tests:   Meniscus Testing:   []   Medial McMurray's Test (MMT)   []   Lateral McMurray's Test (LMT)     Instability Testing:   []   Lachman (ACL)   []   Posterior Drawer (PCL)   []   Valgus Stress in Extension (MCL)   []   Valgus Stress in 30?????? of Flexion (MCL)   []   Varus Stress Test in Extension (LCL)   []   Dial Test 30?????? of Flexion (PLC)   []   Dial Test 90?????? of Flexion (PCL/PLC)     []   Patella Apprehension   []   General Ligamentous Laxity    Provocative Tests: IPSILATERAL HIP  [x]  Negative  Positive Tests:  []  Log Roll  []  Ober Test: ITB Tightness  []  Hamstrings Tightness  []  Straight Leg Raise      Motor Function:  [x]  No gross motor weakness  []  Motor weakness:  []  mild  []  moderate  []  severe  []  Hip Flex  []  Hip ABductors  []  Hip ADductors    []  Quadriceps  []  Hamstrings    []  Anterior Tibialis  []  Posterior Tibialis  []  Peroneals  []  Gastroc-Soleus  []  Ex Hal Longus    Neurologic:  [x]  Sensation to light touch intact L2-S1 dermatomes  [x]  Coordination / proprioception intact    Circulation:  [x]  The limb is warm and well perfused  [x]  Distal pulses intact (LE)  [x]  Capillary refill is intact.  []  Edema  [x]  none  []  mild  []  moderate  []  severe  []  Venous stasis changes  [x]  none  []  mild  []  moderate  []  severe    ORTHOPAEDIC KNEE EXAM: left  Inspection:  [x]  Skin intact without abrasion or lacerations.  []  Ecchymosis:  [x]  none  []  mild  []  moderate  []  severe  []  Atrophy:  [x]  none  []  mild  []   moderate  []  severe  []  Effusion: [x]  none  []   mild  []  moderate  []  severe  []  Scar / Surgical incision(s): []  A-Scope Portals  [x]  Open Surgical  Incision(s) Lt TKA 05/26/16    Alignment:  [x]  Normal []  Varus []  Valgus    Range of Motion:  [x]  Flexion: 120 / Extension full / Hyperextension -  []  Deferred: acute injury/post-surgery/pain  Palpation:  [x]  No Tenderness  []  Tenderness:  []  Patellofemoral Crepitation:  [x]  none  []  mild  []  moderate  []  severe  []  Baker's Cyst:  [x]  none  []  small  []  medium  []  large  []  Homan's Sign: [x]  negative  []  positive    Provocative Tests:  [x]  Negative:  Positive Tests:   Meniscus Testing:   []   Medial McMurray's Test (MMT)   []   Lateral McMurray's Test (LMT)     Instability Testing:   []   Lachman (ACL)   []   Posterior Drawer (PCL)   []   Valgus Stress in Extension (MCL)   []   Valgus Stress in 30?????? of Flexion (MCL)   []   Varus Stress Test in Extension (LCL)   []   Dial Test 30?????? of Flexion (PLC)   []   Dial Test 90?????? of Flexion (PCL/PLC)     []   Patella Apprehension   []   General Ligamentous Laxity    Provocative Tests: IPSILATERAL HIP  [x]  Negative  Positive Tests:  []  Log Roll  []  Ober Test: ITB Tightness  []  Hamstrings Tightness  []  Straight Leg Raise      Motor Function:  [x]  No gross motor weakness  []  Motor weakness:  []  mild  []  moderate  []  severe  []  Hip Flex  []  Hip ABductors  []  Hip ADductors    []  Quadriceps  []  Hamstrings    []  Anterior Tibialis  []  Posterior Tibialis  []  Peroneals  []  Gastroc-Soleus  []  Ex Hal Longus    Neurologic:  [x]  Sensation to light touch intact L2-S1 dermatomes  [x]  Coordination / proprioception intact    Circulation:  [x]  The limb is warm and well perfused  [x]  Distal pulses intact (LE)  [x]  Capillary refill is intact.  []  Edema  [x]  none  []  mild  []  moderate  []  severe  []  Venous stasis changes  [x]  none  []  mild  []  moderate  []  severe      Data Reviewed:    XRays:  Exam: Right Knee, 3 views    History: R knee swelling and pain    Comparison: None available    Findings:  There is  no acute fracture or dislocation. A joint effusion is present.  There is mild to moderate tricompartmental osteoarthritis. Meniscal  calcifications represent chondrocalcinosis. There is no soft tissue  swelling.      Impression:  No acute osseous injury.  Joint effusion.  Mild to moderate osteoarthritis.  Chondrocalcinosis.    Signed By: Nicholes Calamity MD, John    Assessment:    Stan Cantave S Urias is a 57 y.o. year old female given the following diagnoses today:    Encounter Diagnoses  Code Name Primary?  ?????? M25.461 Effusion of right knee Yes        Plan:    I discussed the diagnosis and the treatment options with Amaura Authier S Stille today.  Ample time was given for questions.  All questions were answered to her  satisfaction.    In Summary:  1). The various treatment options were outlined  and discussed with Denice Cardon S Belardo  including:   I would like to relieve her pain by aspirating fluid from the joint. I would  like to send her fluid off for analysis.   I would continue with the steroid since it is helping with her pain.    2). After considering the various options discussed, Tyjae Shvartsman S Postlewait elected to  pursue a course of treatment that includes the following:   Apsiration, continue with prescribed steroid.   Procedure:written informed consent was obtained.  The patient was then placed  supine on the exam table.  The right knee was exposed. Bony landmarks were  palpated and marked.  Skin was prepped with alcohol. 2cc 1% Lidocaine was used  to infiltrate the subcutaneous tissue over the supero-lateral aspect of the  knee.  An 18g needle was then inserted via supero-lateral approach. 90cc of  cloudy, but straw colored fluid was aspirated and sent for analysis.  Superficial hemostasis achieved.  An ace wrap was applied for compression.  The  patient noted immediate improvement in ROM.  Post-procedural instructions were  provided. The patient tolerated the procedure well.  No immediate complications  were noted.    Fluid appeared  consistent with crystalline arthropathy.  Sent to lab for  analysis     Return to Clinic/Follow - Up:  Anoop Hemmer S Mangold was asked to make a follow-up appointment:    [x]  prn    Josiephine Simao S Killian was instructed to call the office if her symptoms worsen or if new  symptoms appear prior to the next scheduled visit. She is specifically  instructed to contact the office between now & her scheduled appointment if she  has concerns related to her condition or if she needs assistance in scheduling  the above tests. She is welcome to call for an appointment sooner if she has any  additional concerns or questions          Orders/Refills/New Prescriptions:  No orders of the defined types were placed in this encounter.      Today's prescription medications will be e-scribed (when appropriate) to the  Patient's Preferred Pharmacy:  [facility] - [place], [place] [address] [facility]  [place] [address] [phone]  [address]  [place] [address]  Phone: [phone] Fax: [phone]      I, Sharyn Blitz. Christin Fudge, ATC, OTC, am acting as scribe for Dr. Reinaldo Raddle. Palmer,  who performed the entire history and physical examination including x-ray  interpretation.          I, Dr. Scherrie Rowe, personally performed the services described in this  documentation as described by Timmie Foerster, ATC in my presence, and it is  both accurate and complete.    Wanda Bateman, MD FAAOS  Orthopaedic Surgery and Sports Medicine  [facility]    This dictation was performed with a verbal recognition program [facility]) and it  was checked for errors. It is possible that there are still dictated errors  within this office note. If so, please bring any errors to my attention for an  addendum. All efforts were made to ensure that this office note is accurate.

## 2017-07-07 NOTE — Unmapped (Signed)
Female  Patient  Encounter    CC:Jerilee Space S Keepers is a 57 y.o. female who presents today for: There were no  encounter diagnoses.    HPI:  Recurrent UTI's- and I.C with OAB   cysto/ hydrodistention bilateral RGP and Renal US normal 2019   Pain controlled, OAB Sx much better   Non smoker   FHx positive for bladder cancer        Allergies:  Allergies  Allergen Reactions  ?????? Azithromycin    **DOES NOT WORK**    ?????? Succinylcholine Other (See Comments)    Other reaction(s): Muscle Aches  Delayed paralysis  Difficulty w/ muscle movement the day after surgery  Other reaction(s): Muscle Aches  Difficulty w/ muscle movement the day after surgery  ?????? Sulfasalazine Rash  ?????? Tegaderm Rash  ?????? Silver Rash  ?????? Sulfa (Sulfonamide Antibiotics) Swelling and Rash  ?????? Sulfanilamide Rash    Sulfonamides      .Medications:    Current Outpatient Medications:  ??????  albuterol (PROVENTIL HFA) 90 mcg/actuation HFA Aerosol Inhaler, Take 2 Puffs  by inhalation every 6 hours as needed., Disp: , Rfl:  ??????  apixaban (ELIQUIS) 5 mg Tablet, Take 5 mg by mouth 2 times daily., Disp: ,  Rfl:  ??????  CALCIUM CITRATE PO, Take  by mouth daily., Disp: , Rfl:  ??????  CHOLECALCIFEROL, VITAMIN D3, PO, Take  by mouth., Disp: , Rfl:  ??????  cyanocobalamin 100 mcg Tablet, Take 100 mcg by mouth daily., Disp: , Rfl:  ??????  docusate sodium (COLACE) 100 mg capsule, Take 100 mg by mouth 2 times daily.,  Disp: , Rfl:  ??????  DULoxetine (CYMBALTA) 60 mg Capsule, Delayed Release(E.C.), Take 60 mg by  mouth daily. Reported on 07/24/2015, Disp: , Rfl:  ??????  fluticasone (FLONASE) 50 mcg/Actuation NA nasal spray, Spray 2 Sprays into  nose daily. Each nostril, Disp: , Rfl:  ??????  gabapentin (NEURONTIN) 600 mg tablet, Take 600 mg by mouth 2 times daily.,  Disp: , Rfl:  ??????  ipratropium (ATROVENT) 0.03 % NA nasal spray, Spray 2 Sprays into nose 2  times daily. Each nostril , Disp: , Rfl:  ??????  lactobacillus rhamnosus (CULTURELLE) 10 billion cell, Take 1 Cap by mouth  daily., Disp:  , Rfl:  ??????  levothyroxine (SYNTHROID) 75 mcg PO tablet, Take 66 mcg by mouth daily.,  Disp: , Rfl:  ??????  methylPREDNISolone (MEDROL) 4 mg tab (dosepak), follow package directions  (Patient not taking: Reported on 07/07/2017), Disp: 1 Dosepak, Rfl: 0    Past Medical History:   has a past medical history of Anemia, Anxiety, Arthritis, Asthma, Awareness  under anesthesia, Back problem, Cancer (CMS HCC), Chest pain, Complication of  anesthesia, Dysplasia, GERD (gastroesophageal reflux disease), Hernia,  Hypertension, Irregular heart beat, Liver disorder, Mobility impaired  (07/01/2016), Occlusive thrombus (2017), Osteopenia, Recurrent UTI (03/10/2017),  RSD (reflex sympathetic dystrophy), Sleep apnea, Suspected sleep apnea  (07/01/2016), Thyroid disorder, Type 2 diabetes mellitus (CMS HCC), and Venous  thrombosis and embolism.    Family History:  family history includes Cancer in an other family member; Cancer (age of onset:  85) in her maternal grandmother; Colon Cancer (age of onset: 22) in her maternal  grandfather; Diabetes in her father and another family member; Heart Problems in  her paternal grandfather; High Blood Pressure in her father and mother; Stroke  in an other family member.    Social History:   reports that she has never smoked. She has never used smokeless  tobacco. She  reports that she drinks alcohol. She reports that she does not use drugs.    ROS:  Constitutional:  denies decreased appetite, headaches, fevers, chronic sweats or  weight loss of > 20 lbs.  Eyes:  denies blurred vision, double vision, cataracts or glaucoma.  Ears:  Hearing intact  Cardiovascular:  denies palpitations, heart murmur, heart failure, chest pain,  heart attack, blood clots  Respiratory:  denies SOB, coughing up blood, asthma/wheezing, chronic cough,  emphysema,  Gastrointestinal:  denies diarrhea, liver problems, gall bladder disease,  constipation, stomach pain  Genitourinary:  denies infertility, painful urination,  venereal disease.  Musculoskeletal:  denies muscle weakness, muscle/joint aches, herniated disk,  arthritis, slipped disc  Skin:  denies rash, moles/nevi, melanoma or skin cancer.  Neurologic:  denies tremors, stroke/TIA/Mini stroke, head injury, memory loss,  dizziness, seizures  Psychiatric Illness:  denies depression, anxiety, confusion.  Hematological/Lymphatic:  denies easy bruising, abnormal bleeding, anemia and  swollen lymph nodes.    PHYSICAL EXAM:  Resp 16    Ht 5' 8 (1.727 m)    Wt 193 lb (87.5 kg)    LMP 04/12/2008    BMI  29.35 kg/m??????  Constitutional: well appearing, in no acute distress, oriented  GI: no masses, tenderness, scars,hepatomegaly, hernia  Neck: no masses, symmetrical, no thyroid enlargement  Resp; normal respiratory effort  CV: no cyanosis, no edema  Lymph3: neck, groin no lyphadenopathy  Skin: no rashes,lesions, ulcers  Psy: Oriented x 3, normal affect    Data Review: Reviewed pertinent clinical information inclusive of the following    Radiology:  Labs:  Review of UA: none    Impression/ Plan:    1.  Recurrent UTI  2.  Pelvic pain / I.C  3. OAB  RUS normal 2/19  Cysto BRGP and hydro-distention 2019 resolved and OAB Sx essentially resolved  Will observe follow up in 1 year    Encounter Diagnosis and Associated Orders:  No diagnosis found.      Kimberly A. Leo Grosser  Acting as a Neurosurgeon.  07/13/2017 3:40 PM    The documentation recorded by the scribe accurately reflects the service I  personally performed and the decisions made by me.  Jodi Marble, MD  07/13/2017 3:40 PM

## 2017-07-12 LAB — COMPREHENSIVE METABOLIC PANEL
A/G Ratio: 1.6 (ref 1.2–2.2)
ALT: 16 IU/L (ref 0–32)
AST: 17 IU/L (ref 0–40)
Albumin: 4.2 g/dL (ref 3.5–5.5)
Alkaline Phosphatase: 79 IU/L (ref 39–117)
BUN/Creatinine Ratio: 33 — ABNORMAL HIGH (ref 9–23)
BUN: 19 mg/dL (ref 6–24)
CO2: 25 mmol/L (ref 20–29)
Calcium: 9.8 mg/dL (ref 8.7–10.2)
Chloride: 103 mmol/L (ref 96–106)
Creatinine: 0.58 mg/dL (ref 0.57–1.00)
GFR MDRD Af Amer: 119 mL/min/1.73 (ref >59)
Globulin, Total: 2.6 g/dL (ref 1.5–4.5)
Glucose: 84 mg/dL (ref 65–99)
Potassium: 4.1 mmol/L (ref 3.5–5.2)
Sodium: 143 mmol/L (ref 134–144)
Total Bilirubin: 0.4 mg/dL (ref 0.0–1.2)
Total Protein: 6.8 g/dL (ref 6.0–8.5)
eGFR Non-Afr. American: 103 mL/min/1.73 (ref >59)

## 2017-07-12 LAB — IRON: Iron: 70 ug/dL (ref 27–159)

## 2017-07-12 LAB — VITAMIN B12: Vitamin B-12: 688 pg/mL (ref 232–1245)

## 2017-07-12 LAB — VITAMIN D 25 HYDROXY: Vit D, 25-Hydroxy: 46.2 ng/mL (ref 30.0–100.0)

## 2017-07-12 LAB — FOLATE: Folic Acid: >20 ng/mL (ref >3.0)

## 2017-07-12 LAB — TSH: TSH: 1.1 u[IU]/mL (ref 0.450–4.500)

## 2017-07-15 ENCOUNTER — Ambulatory Visit: Admit: 2017-07-15 | Discharge: 2017-07-15 | Payer: PRIVATE HEALTH INSURANCE

## 2017-07-15 DIAGNOSIS — R599 Enlarged lymph nodes, unspecified: Secondary | ICD-10-CM

## 2017-07-15 DIAGNOSIS — H7391 Unspecified disorder of tympanic membrane, right ear: Secondary | ICD-10-CM | POA: Insufficient documentation

## 2017-07-15 DIAGNOSIS — E538 Deficiency of other specified B group vitamins: Secondary | ICD-10-CM | POA: Insufficient documentation

## 2017-07-15 MED ORDER — albuterol (PROVENTIL;VENTOLIN;PROAIR) 90 mcg/actuation inhaler
90 | Freq: Four times a day (QID) | RESPIRATORY_TRACT | 3 refills | Status: AC | PRN
Start: 2017-07-15 — End: ?

## 2017-07-15 MED ORDER — DULoxetine (CYMBALTA) 60 MG capsule
60 | ORAL_CAPSULE | Freq: Every day | ORAL | 11 refills | Status: AC
Start: 2017-07-15 — End: ?

## 2017-07-15 MED ORDER — levothyroxine (SYNTHROID, LEVOTHROID) 50 MCG tablet
50 | ORAL_TABLET | Freq: Every morning | ORAL | 11 refills | Status: AC
Start: 2017-07-15 — End: ?

## 2017-07-15 MED ORDER — gabapentin (NEURONTIN) 600 MG tablet
600 | ORAL_TABLET | Freq: Three times a day (TID) | ORAL | 11 refills | Status: AC
Start: 2017-07-15 — End: ?

## 2017-07-15 MED ORDER — ipratropium (ATROVENT) 0.03 % nasal spray
21 | Freq: Two times a day (BID) | NASAL | 3 refills | 30.00000 days | Status: AC
Start: 2017-07-15 — End: ?

## 2017-07-15 NOTE — Unmapped (Signed)
Instructed pt to continue current meds

## 2017-07-15 NOTE — Unmapped (Signed)
[facility]  [address]  [place] [address]    Name:  Wanda Rowe Date of Birth: 04/26/1960 (57 y.o.)   MRN: [id number]    Date of Service:  07/15/2017     Subjective:     Chief Complaint   Patient presents with   ??? Hypothyroidism   ??? Vitamin D Deficiency   ??? Vitamin B12 Deficiency   ??? lab results     has some questions about her christ labs in care everywhere     History of Present Illness:  Wanda Rowe is a(n) 57 y.o. female here today for the following:   Ed fu from 5/28. needs labs interpreted from ed knee tap ortho did not do. Incidental enlarged right groin node found. Has right ear pain treated for ear infection. Fu on thyroid rsd. Moving to [place]        Current Outpatient Prescriptions   Medication Sig Dispense Refill   ??? albuterol (PROVENTIL;VENTOLIN;PROAIR) 90 mcg/actuation inhaler Inhale 1-2 puffs into the lungs every 6 hours as needed. 1 Inhaler 3   ??? apixaban (ELIQUIS) 5 mg Tab Take 5 mg by mouth 2 times a day.     ??? blood sugar diagnostic (ACCU-CHEK AVIVA PLUS TEST STRP) Strp fsbs qd 100 strip 1   ??? blood-glucose meter (GLUCOSE MONITORING KIT) kit by Other route 2 (two) times daily. Use as instructed - fsbs bid      ??? CALCIUM CITRATE ORAL Take 1 tablet by mouth daily.       ??? cholecalciferol, vitamin D3, 2,000 unit Cap Take 1 capsule by mouth daily. 1 capsule 0   ??? DULoxetine (CYMBALTA) 60 MG capsule Take 1 capsule (60 mg total) by mouth daily. 30 capsule 5   ??? fluticasone (FLONASE) 50 mcg/actuation nasal spray Use 2 sprays into each nostril daily. 16 g 5   ??? gabapentin (NEURONTIN) 600 MG tablet Take 1 tablet (600 mg total) by mouth 3 times a day. 270 tablet 5   ??? ipratropium (ATROVENT) 0.03 % nasal spray USE 2 SPRAYS IN EACH NOSTRIL TWICE DAILY 30 mL 0   ??? LACTOBACILLUS ACIDOPHILUS (PROBIOTIC ORAL) Take 1 capsule by mouth daily.     ??? levothyroxine (SYNTHROID, LEVOTHROID) 50 MCG tablet Take 1 tablet (50 mcg total) by mouth every morning  before breakfast. 30 tablet 5   ??? vit b complex w-b 12 tablet Take 1 tablet by mouth daily. 30 tablet 0   ??? AMOXicillin-clavulanate (AUGMENTIN) 875-125 mg per tablet Take 1 tablet by mouth 2 times a day. 20 tablet 0   ??? DULoxetine (CYMBALTA) 60 MG capsule TAKE 1 CAPSULE(60 MG) BY MOUTH DAILY 30 capsule 0   ??? levoFLOXacin (LEVAQUIN) 500 MG tablet Take 1 tablet (500 mg total) by mouth daily. 10 tablet 0     No current facility-administered medications for this visit.       Review of Systems   Constitutional: Negative for fatigue.   All other systems reviewed and are negative.           Objective:     Vitals:    07/15/17 0808   BP: 126/88   BP Location: Left arm   Patient Position: Sitting   BP Cuff Size: Regular   Pulse: 72   Resp: 12   SpO2: 97%   Weight: 195 lb (88.5 kg)   Height: 5' 7.01 (1.702 m)     Body mass index is 30.53  kg/m??.    Physical Exam   Constitutional: She appears well-developed and well-nourished.   HENT:   Head: Normocephalic and atraumatic.   Retracted right tm   Cardiovascular: Normal rate, regular rhythm and normal heart sounds.  Exam reveals no gallop and no friction rub.    No murmur heard.  Pulmonary/Chest: Effort normal and breath sounds normal.   Abdominal: Soft. Bowel sounds are normal.   Musculoskeletal: Normal range of motion.   Psychiatric: She has a normal mood and affect. Her behavior is normal.                 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

## 2017-07-15 NOTE — Assessment & Plan Note (Signed)
Instructed pt to continue current meds

## 2017-07-15 NOTE — Assessment & Plan Note (Signed)
cymbalta 

## 2017-07-15 NOTE — Assessment & Plan Note (Signed)
Autoinflation refer to ent if no better

## 2017-07-15 NOTE — Assessment & Plan Note (Signed)
Repeat US likely reactive but want to confirn due to ca history

## 2017-07-27 ENCOUNTER — Inpatient Hospital Stay: Admit: 2017-07-27 | Payer: PRIVATE HEALTH INSURANCE

## 2017-07-27 DIAGNOSIS — R590 Localized enlarged lymph nodes: Secondary | ICD-10-CM

## 2017-08-16 NOTE — Unmapped (Signed)
Follow Up Form  Chief Complaint  Patient presents with  ?????? Breast Cancer Hx    8mo. F/U Rt IDC  ?????? Imaging Results    Bilat Mammo w/ TOMO done today 08/16/17      Wanda Rowe is a 57 year old woman, who is here for her 6 month follow up visit  for right breast cancer. She was on Evista and stopped it after developing a DVT  in August 2017. She reports having right breast pain with her imaging, today.    She sees Dr. Selena Batten and her next visit is in December 2019.      She did undergo imaging, today. This included bilateral dx tomosynthesis.  Results are as follows:    MAMM-DIAG BILAT W/TOMO    REASON FOR EXAMINATION: Previous right breast cancer    COMPARISON: Prior studies most recent 2019    DENSITY: The breast parenchyma is heterogeneously dense, which may obscure  small masses.    FINDINGS:    Computer aided detection assisted in the interpretation of the mammogram.  Patient status post right lumpectomy. There are some benign-appearing  calcifications both breasts including  dystrophic appearing calcifications  right breast. Microclip left breast from previous biopsy. No suspicious  mass, no new areas of architectural distortion or groupings of suspicious  calcifications are seen.      IMPRESSION:  No direct or indirect evidence of malignancy is seen on the current study.    BI-RADS CATEGORY: 2 - BENIGN    RECOMMENDED FOLLOW-UP: Bilateral Follow up mammogram in 1 year      This study was performed and interpreted using full-field digital  mammographic and computer-aided detection.    The [employer] of [employer] recommends annual screening mammography  for women age 16 and above.    Please note that mammography is not 100% accurate in diagnosing breast  cancer. A routine breast examination is recommended to correlate with  mammographic findings.    Signed By: Malva Cogan MD, Idamae Schuller:    BREAST CANCER HX  Tumor Size: Right IDC, stage I  T: T1b  N: N0  M: cM0  Estrogen: negative  Progesterone:  negative  Her 2 neu: positive by IHC  Oncotype Score: None  Chemotherapy: neoadjuvantTCHP(completed 12/26/12)  Radiation Therapy: Right WBR without Boost(42.56Gy in 16 Fx completed 04/19/14)  Endocrine Therapy: Evista  Breast Surgery Type/Date: right wire loc lumpectomy w/SNB  DX Date: 08/29/12    OB-GYN  Age of Menarche: 64  G: 1  P: 1  Age of 1st Delivery: 53  Menopause Age: 2 (Partial hysterectomy)  BCP Use: yes, 5 years  Fertility Treatment: no  Hormone Replacement: no    IMAGING/BIOPSY HX  Age of 1st mammogram: 53  Date of last mammogram: 03/29/17(Lt breast with TOMO)  Frequency : annually  Institution : [facility]  Past Biopsies: yes, 2014, [facility]    BREAST CA RISK ASSESSMENT  Family HX Breast CA: No  Family HX Ovarian CA: No  Family HX Other CAs: Yes(MGM bladder 60's, MGF colon 60's)  BRCA Testing?: No    Imaging performed: Bilateral dx tomosynthesis    Medical/Surgical/Family/Social history updates since previous visit: She is  moving to [place], [place] for work. Her daughter is going into Sophomore year at  [employer].    Review of Systems  Constitutional: Negative.  Negative for chills, diaphoresis, fever,  malaise/fatigue and weight loss.  Respiratory: Negative for cough and shortness of breath.  Cardiovascular: Negative for chest pain and leg swelling.  Gastrointestinal: Negative for abdominal pain, nausea and vomiting.  Musculoskeletal: Negative for back pain, joint pain and neck pain.  Skin: Negative for itching and rash.  Neurological: Negative for weakness and headaches.  Psychiatric/Behavioral: Negative for depression and suicidal ideas. The patient  is not nervous/anxious.  All other systems reviewed and are negative.      Physical Exam  Constitutional: She is oriented to person, place, and time. She appears  well-developed and well-nourished.  HENT:  Head: Normocephalic and atraumatic.  Eyes: EOM are normal.  Neck: Normal range of motion. Neck supple. No JVD present. No tracheal  deviation  present. No thyromegaly present.  Cardiovascular: Normal rate.  Pulmonary/Chest: Effort normal. No stridor. No respiratory distress. She  exhibits no tenderness. Right breast exhibits skin change. Right breast exhibits  no inverted nipple, no mass, no nipple discharge and no tenderness. Left breast  exhibits skin change. Left breast exhibits no inverted nipple, no mass, no  nipple discharge and no tenderness. Breasts are symmetrical.      Bilateral breasts with everted nipples. All incisions well healed. No masses and  no other skin changes.  Abdominal: Soft. She exhibits no distension and no mass. There is no tenderness.  There is no rebound and no guarding.  Musculoskeletal: Normal range of motion. She exhibits no edema or tenderness.  Lymphadenopathy:       Head (right side): No submandibular adenopathy present.       Head (left side): No submandibular adenopathy present.    She has no cervical adenopathy.       Right cervical: No superficial cervical and no deep cervical adenopathy  present.       Left cervical: No superficial cervical and no deep cervical adenopathy  present.    She has no axillary adenopathy.       Right axillary: No pectoral and no lateral adenopathy present.       Left axillary: No pectoral and no lateral adenopathy present.       Right: No supraclavicular adenopathy present.       Left: No supraclavicular adenopathy present.  Neurological: She is alert and oriented to person, place, and time. No cranial  nerve deficit. Coordination normal.  Skin: Skin is warm and dry. No rash noted. No erythema. No pallor.  Psychiatric: She has a normal mood and affect. Her behavior is normal. Judgment  and thought content normal.  Nursing note and vitals reviewed.    Vitals:   08/16/17 1059  BP: 126/80  Pulse: 67  Temp: 98.6 ??????F (37 ??????C)  TempSrc: Oral  Weight: 199 lb 12.8 oz (90.6 kg)  Height: 5' 8 (1.727 m)      Assessment: 57 year old woman 5 years out from dx of right IDC, treated  with  lumpectomy, SLNB, chemotherapy and radiation. Imaging benign. NED.    Plan: Moving to [place], [place] and name given for another breast surgeon at  [facility]. Will have breast MRI prior to end of August 25th. All path reports, op  report, imaging reports and disc with images given to patient. Continue monthly  self exam.    Data reviewed: x-ray, review old MR and/or Hx other source and/or discussion of  case with another provider and Drs visual test/study    Patient's Medications  Current Medications   ALBUTEROL (PROVENTIL HFA) 90 MCG/ACTUATION HFA AEROSOL INHALER    Take 2 Puffs  by inhalation every 6 hours as needed.  Order Dose: 2 Puffs   APIXABAN (ELIQUIS) 5 MG TABLET    Take 5 mg by mouth 2 times daily.      Order Dose: 5 mg   CALCIUM CITRATE PO    Take  by mouth daily.      Order Dose: --   CHOLECALCIFEROL, VITAMIN D3, PO    Take  by mouth.      Order Dose: --   CYANOCOBALAMIN 100 MCG TABLET    Take 100 mcg by mouth daily.      Order Dose: 100 mcg   DOCUSATE SODIUM (COLACE) 100 MG CAPSULE    Take 100 mg by mouth 2 times daily.      Order Dose: 100 mg   DULOXETINE (CYMBALTA) 60 MG CAPSULE, DELAYED RELEASE(E.C.)    Take 60 mg by  mouth daily. Reported on 07/24/2015      Order Dose: 60 mg   FLUTICASONE (FLONASE) 50 MCG/ACTUATION NA NASAL SPRAY    Spray 2 Sprays into  nose daily. Each nostril      Order Dose: 2 Sprays   GABAPENTIN (NEURONTIN) 600 MG TABLET    Take 600 mg by mouth 2 times daily.      Order Dose: 600 mg   IPRATROPIUM (ATROVENT) 0.03 % NA NASAL SPRAY    Spray 2 Sprays into nose 2  times daily. Each nostril      Order Dose: 2 Sprays   LACTOBACILLUS RHAMNOSUS (CULTURELLE) 10 BILLION CELL    Take 1 Cap by mouth  daily.      Order Dose: 1 Cap   LEVOTHYROXINE (SYNTHROID) 75 MCG PO TABLET    Take 66 mcg by mouth daily.      Order Dose: 66 mcg   METHYLPREDNISOLONE (MEDROL) 4 MG TAB (DOSEPAK)    follow package directions      Order Dose: --      Follow up: PRN    Imaging needed: Due for bilateral dx  tomosynthesis in 12 months    I spent 20 minutes face to face with the patient, greater than 75% of which was  dedicated toward counseling.    Amie Portland, CMA , acting as a scribe for Dr. Ammie Ferrier    The above note has been reviewed and reflects the work and decisions made by the  physician.  Victorino Dike B. Vanice Sarah, MD, [facility]    08/16/2017    [facility]  [facility] - [facility], [place]  [address], [address]  [place] [address]  Dept: [phone]  Dept Fax: [phone]  Loc: [phone]  Loc Fax: [phone]

## 2017-09-06 ENCOUNTER — Ambulatory Visit: Admit: 2017-09-06 | Payer: PRIVATE HEALTH INSURANCE | Attending: Gerontology

## 2017-09-06 DIAGNOSIS — K581 Irritable bowel syndrome with constipation: Secondary | ICD-10-CM

## 2017-09-06 MED ORDER — linaCLOtide (LINZESS) 290 mcg Cap
290 | ORAL_CAPSULE | Freq: Every day | ORAL | 2 refills | Status: AC
Start: 2017-09-06 — End: ?

## 2017-09-06 NOTE — Unmapped (Addendum)
Please complete the Miralax colon cleanse in 1 day, you can take your time and drink it over 12 hours if you need to. ??  Mix 20 doses of Miralax with 80 ounces of Gatorade.  Stay on clear liquids while drinking the cleanse- Sprite, apple juice, coffee without creamer or any other clear fluids.  You can eat regular foods after you complete the cleanse.  Then start Linzess 290 mcg (1 tab) daily to have a goal of 1-2 good bowel movements daily.  If the Linzess is helping move your bowels but stools are too loose or not enough each day, please add daily Fibercon 1-2 tabs.    Please start the Low FODMAP diet.  Continue the daily Probiotic.

## 2017-09-06 NOTE — Progress Notes (Signed)
GASTROENTEROLOGY   HISTORY OF PRESENT ILLNESS:    The patient is a 57 y.o. female who I met in 04/2017 for rectal bleeding and constipation since 07/2015.  Is on Gabapentin x20 years, and Synthroid.  Has hx of total abdominal hysterectomy, gastric sleeve surgery, precancerous gastric polyps, and LA grade C esophagitis.  EGD 07/2015 (Dr. Mickie Bail) showed LA grade C esophagitis, no gastric polyps.  Had normal colonoscopy 07/2015 (Dr. Mickie Bail), due for surveillance in 7 years (07/2022).  MGF had Colon Cancer in his 47s.  Says she has a hx of IBS-D for many years until 07/2015 when constipation started.  At initial OV 04/2017 was having 3-4 small inadequate balls of stool daily with straining and BRBPR when wiping x3 occurrences.  GYN started Colace BID and suggested Miralax.  She didn't start Miralax as she was nervous to have diarrhea again.  Was also having heartburn, bloating, gassiness, and wt gain.  No CP, SOB, presyncope, or fatigue.  04/19/17 CBC and BMP normal (Care [facility] at [facility]).  At that time suspected BRBPR was hemorrhoidal and other symptoms wererelated to constipation.  We discussed KUB vs empiric bowel cleanse then daily Miralax.  She preferred a KUB which shows significant stool and some gas throughout colo.  Was advised 4L Golytely cleanse then daily Miralax.  Of note- She does not want to take Any antacids with her hx of gastric polyps.  Last OV 06/07/17 patient reported improvements in gas and bloating. Heartburn and rectal bleeding resolved. Had 10 days of fecal incont s/p cleanse. Was taking Miralax inconsistently and having 1-2 formed BMs/d now, inadequate.  Advised starting Fibercon tabs instead of miralax for better compliance, start Low FODMAP diet, continue probiotic, ordered celiac panel, and if GERD returns despite adequate daily laxation, consider repeat EGD with hx of LA Grade C esophagitis.    Here today for follow up:  Normal Celiac Panel 06/07/17  Taking 2 Fibercon daily and having  small balls of stool daily, always inadequate  No incontinence.  Feels constipated and bloated.  Didn't really try Low FODMAP diet.  On daily Probiotic  Feeling fullness in upper esophagus hours after eating, eventually goes away on its own, does report eating large meals which she thinks is the cause.  Very occasional heartburn  No regurgitation.  No dysphagia or odynophagia.  Not on PPI antacids due to hx of stomach polyps, not using otc antacids either.  No other GI symptoms.  Is stress eating due to upcoming move to [place] in 6 weeks.  Reports that she gained a few pounds.    Procedure History:  Colonoscopy 07/09/15-The entire examined colon is normal. No specimens collected. Repeat colonoscopy in 7 years for surveillance.  EGD 07/09/15- LA Grade C reflux esophagitis. S/p gastric sleeve. No gastric polyps. Use Prilosec (omeprazole) 20 mg PO daily.    GI FamHx:  MGF- Colon Cancer 60s.    Past Medical History:   Diagnosis Date    Anemia     Anxiety     Asthma     Mild    Breast cancer (CMS Dx)       Right Breast    Diabetes mellitus (CMS Dx)     Type 2   resolved with gastric sleeve    DVT (deep vein thrombosis) in pregnancy (CMS Dx)     GERD (gastroesophageal reflux disease)     Hyperlipidemia     Hypertension     Hypothyroidism     Mitral valve prolapse  Polyp of stomach     RSD (reflex sympathetic dystrophy)     R knee     Past Surgical History:   Procedure Laterality Date    ANTERIOR CRUCIATE LIGAMENT REPAIR      L     birth mark      benign on buttocks deep    BREAST BIOPSY Left     BREAST SURGERY Right     Lumpectomy chemo and radiation    COLONOSCOPY      COLONOSCOPY W/ POLYPECTOMY      benign    COLPOSCOPY      DENTAL SURGERY      Periodontal     ESOPHAGOGASTRODUODENOSCOPY N/A 07/19/2015    Procedure: ESOPHAGOGASTRODUODENOSCOPY WITH COLONOSCOPY WITH MAC;  Surgeon: Lurene Shadow, MD;  Location: [facility] ENDOSCOPY;  Service: Gastroenterology;  Laterality: N/A;    FOOT SURGERY      gastric  sleeve      HYSTERECTOMY      IR IVC FILTER PLACE  05/22/2016    JOINT REPLACEMENT Left     Knee    KNEE SURGERY  05/2016    Left Knee Replacement    MENISCECTOMY      L x 2, R x 1    TYMPANOSTOMY TUBE PLACEMENT      WISDOM TOOTH EXTRACTION       Family History   Problem Relation Age of Onset    Hypertension Mother     Other Mother         DDD    Diabetes Father         Type 2    Hypertension Father     Factor VIII deficiency Father     Other Brother         Back Pain    Diabetes Maternal Grandmother         Type 2    Cancer Maternal Grandmother         Bladder CA    Stroke Maternal Grandmother     Colon Cancer Maternal Grandfather     Breast Cancer Paternal Grandmother      Social History     Social History    Marital status: Single     Spouse name: N/A    Number of children: N/A    Years of education: N/A     Occupational History    Attorney      Social History Main Topics    Smoking status: Never Smoker    Smokeless tobacco: Never Used      Comment: (06/23/2006)    Alcohol use Yes      Comment: Occasionally (06/23/2006)    Drug use: No    Sexual activity: Not on file     Other Topics Concern    Caffeine Use No     rarely    Occupational Exposure No    Exercise Yes    Seat Belt Yes     Social History Narrative    No narrative on file     REVIEW OF SYSTEMS:    See pertinent positives in HPI.    PHYSICAL EXAM:    General: Normal appearance, normal affect.    Head: Normocephalic, no lesions.    Eyes: Sclera normal color. Normal hydration.    Ears: Normal.    Nose: Mucosa normal, no sinus tenderness.    Neck: No masses, no thyromegaly, no adenopathy, no bruits.    Chest: Lungs clear, no rales, no rhonchi, no wheezes.  Heart: Regular rhythm, no murmurs, no rubs, no gallops.    Abdomen: Soft, no tenderness, no masses, no enlargement of the liver or spleen, normal peristaltic sounds.     Extremities: No deformities, no edema, no erythema, normal peripheral pulses.    Neuro: Normal mental  status, normal tendon reflexes, normal gait.       IMPRESSION/RECOMMENDATIONS:    57 y/o woman with IBS-C.  Has been on Gabapentin and Cymbalta for many years.  Is on Synthroid for Hypothyroidism.  Has hx of total abdominal hysterectomy, gastric sleeve surgery, precancerous gastric polyps, and LA grade C esophagitis.  EGD 07/2015 (Dr. Mickie Bail) showed LA grade C esophagitis, no gastric polyps.  Had normal colonoscopy 07/2015 (Dr. Mickie Bail), due for surveillance in 7 years (07/2022).  MGF had Colon Cancer in his 16s.  Per patient has a hx of IBS-D for many years until 07/2015 when constipation started.  04/2017 KUB showed significant stool burden.   She completed a Golytely cleanse and constipation symptoms improved.  Daily Miralax gave her 1-2 formed BMs/d but inadequate.  Daily Fibercon gives her small formed BMs daily, inadequate.  Today feels constipated and bloated.  Has very occasional heartburn and endorses fullness in upper esophagus after eating large meals.  Weight gain.    Today we discussed repeat KUB vs empiric cleanse vs starting Linzess.  Decided to empirically cleanse then start Linzess 290 mcg/d for better daily laxation.  If linzess causes too loose of stools or inadequate stools, may need to add Fibercon 1-2 tabs daily to regimen.  Start Low FODMAP diet   Continue Probiotic  If upper esophagus fullness sensation is not relieved with improving colonic pressures, schedule EGD.  RTO 1 month.  Of note- she is moving to [place], [place] in 6 weeks, plans to find another GI provider. Will have her sign records release paper at follow up.    Electronically signed by Roxy Horseman, CNP

## 2017-09-06 NOTE — Telephone Encounter (Signed)
PA initiated for Linzess

## 2017-09-07 NOTE — Telephone Encounter (Signed)
PA approved for Linzess to 09/07/2018

## 2017-09-09 NOTE — Unmapped (Signed)
Subjective  Subjective:    Patient ID: Wanda Rowe  Age: 57 y.o. (DOB: 14-Apr-1960)  Ethnicity: Non-Hispanic  Race: White or Caucasian  Gender: female    Chief Complaint:  Chief Complaint  Patient presents with  ?????? Follow-up    6 month follow up      HPI    Location:  [facility]  Signs/Symptoms: repeat pap  Severity:  1  Quality:  na  Duration:  6 mos  Prior Treatment: none    Past Medical History:  Diagnosis Date  ?????? Anemia   in past  ?????? Anxiety  ?????? Arthritis   knees  ?????? Asthma  ?????? Awareness under anesthesia   woke up during endoscopy  ?????? Back problem   occ back ache  ?????? Cancer (CMS HCC)   right breast- chemo. and radiation in 2014 & 2015, hx of Lumpectomy  ?????? Chest pain   when under stress-testing done & no cause of chest pain was found  ?????? Complication of anesthesia   rxn to succinylcholine-day after surgery, pt  had difficulty w/ muscle  movement-dr j collins in anesthesia dept notified- Informed Dr. Unknown Foley, [facility]  for upcoming procedure on 07/07/16- UPDATE- Notified Dr. Regino Schultze, A[facility] for upcoming  procedure on 03/20/17- states to document and inform surgical booking to add to  case comments  ?????? Dysplasia  ?????? GERD (gastroesophageal reflux disease)   no meds  ?????? Hernia   hiatal-resolved with wt loss surg  ?????? Hypertension   in past & no longer taking medication  ?????? Irregular heart beat   MVP & heart murmur-used to take pre-dental antibiotics  ?????? Liver disorder   pt told she has a fatty liver-resolved per pt  ?????? Mobility impaired 07/01/2016   uses cane currently d/t recent s/p left knee replacement on 05/26/16  ?????? Occlusive thrombus 2017   dvts in past; IVC filter inserted 05/22/16  ?????? Osteopenia  ?????? Recurrent UTI 03/10/2017  ?????? RSD (reflex sympathetic dystrophy)  ?????? Sleep apnea   USES CPAP, states she stopped breathing after given a medication possibly a  sedative  ?????? Suspected sleep apnea 07/01/2016   pt. states prior to knee replacement when she was given preop meds, she heard  the nurses calling  her name and states that something had dropped suddenly: pt.  thinks it may have been her 02. She states she was advised to get a sleep study  in the future. pt. is not sure what med caused this to happen prior to  surgery  on 05/26/16.  ?????? Thyroid disorder   hypothyroidism  ?????? Type 2 diabetes mellitus (CMS HCC)   PT. STATES USED TO BE DIABETIC NO LONGER ON MEDS D/T WEIGHT LOSS  ?????? Venous thrombosis and embolism    OB History  Gravida Para Term Preterm AB Living  1 1       1   SAB TAB Ectopic Multiple Live Births      # Outcome Date GA Lbr Len/2nd Weight Sex Delivery Anes PTL Lv  1 Para    Past Surgical History:  Procedure Laterality Date  ?????? CYSTOURETHROSCOPY,URETER CATHETER Bilateral 03/19/2017   CYSTOSCOPY WITH CATHETERIZATION URETER performed by Mack Hook., MD at [facility]  OR  ?????? HX BREAST BIOPSY  july 2014   right  ?????? HX BREAST LUMPECTOMY  02-2013   right; also bilat reduction; back to surg next day for post op bleeding  ?????? HX COLPOSCOPY  ?????? HX FOOT SURGERY   right  ?????? HX  GUM SURGERY   done for receding gums  ?????? HX HYSTERECTOMY, TOTAL ABDOMINAL  ?????? HX KNEE SURGERY   left x 3, right x 1  ?????? HX LAPAROSCOPY  ?????? HX OTHER SURGICAL HISTORY  jan 2014   gastric sleeve  ?????? HX OTHER SURGICAL HISTORY   ins. portacath  ?????? HX TOTAL KNEE ARTHROPLASTY Left 05/26/2016  ?????? HX UPPER GI ENDOSCOPY   mult in past due to stomach polyps  ?????? HX VAGINAL DELIVERY  ?????? HX VASCULAR SURGERY  05/22/2016   IVC filter  ?????? REMOVAL OF OVARY/TUBE(S)   bilateral salpingo-oophorectomy    Social History    Tobacco Use  ?????? Smoking status: Never Smoker  ?????? Smokeless tobacco: Never Used  Substance Use Topics  ?????? Alcohol use: Yes    Comment: very occasionally  ?????? Drug use: No    Social History    Substance and Sexual Activity  Sexual Activity Yes  ?????? Partners: Male    Social History    Social History Narrative  ?????? Not on file    Family History  Problem Relation Name Age of Onset  ?????? High Blood Pressure Mother  ?????? Diabetes  Father  ?????? High Blood Pressure Father  ?????? Heart Problems Paternal Grandfather       mi  ?????? Cancer Other       unspecified grandmother  ?????? Diabetes Other       unspecified grandmother  ?????? Stroke Other       unspecified grandmother  ?????? Colon Cancer Maternal Grandfather  60  ?????? Cancer Maternal Grandmother  60       Bladder  ?????? Anesthesia Complications Neg Hx      Allergy:  Allergies  Allergen Reactions  ?????? Azithromycin    **DOES NOT WORK**    ?????? Succinylcholine Other (See Comments)    Other reaction(s): Muscle Aches  Delayed paralysis  Difficulty w/ muscle movement the day after surgery  Other reaction(s): Muscle Aches  Difficulty w/ muscle movement the day after surgery  ?????? Sulfasalazine Rash  ?????? Tegaderm Rash  ?????? Silver Rash  ?????? Sulfa (Sulfonamide Antibiotics) Swelling and Rash  ?????? Sulfanilamide Rash    Sulfonamides      Medications:  Current Outpatient Medications  Medication Sig Dispense Refill  ?????? albuterol (PROVENTIL HFA) 90 mcg/actuation HFA Aerosol Inhaler Take 2 Puffs by  inhalation every 6 hours as needed.  ?????? apixaban (ELIQUIS) 5 mg Tablet Take 5 mg by mouth 2 times daily.  ?????? CALCIUM CITRATE PO Take  by mouth daily.  ?????? CHOLECALCIFEROL, VITAMIN D3, PO Take  by mouth.  ?????? cyanocobalamin 100 mcg Tablet Take 100 mcg by mouth daily.  ?????? docusate sodium (COLACE) 100 mg capsule Take 100 mg by mouth 2 times daily.  ?????? DULoxetine (CYMBALTA) 60 mg Capsule, Delayed Release(E.C.) Take 60 mg by mouth  daily. Reported on 07/24/2015  ?????? fluticasone (FLONASE) 50 mcg/Actuation NA nasal spray Spray 2 Sprays into nose  daily. Each nostril  ?????? gabapentin (NEURONTIN) 600 mg tablet Take 600 mg by mouth 2 times daily.  ?????? ipratropium (ATROVENT) 0.03 % NA nasal spray Spray 2 Sprays into nose 2 times  daily. Each nostril  ?????? lactobacillus rhamnosus (CULTURELLE) 10 billion cell Take 1 Cap by mouth  daily.  ?????? levothyroxine (SYNTHROID) 75 mcg PO tablet Take 66 mcg by mouth daily.  ?????? methylPREDNISolone  (MEDROL) 4 mg tab (dosepak) follow package directions  (Patient not taking: Reported on 07/07/2017) 1 Dosepak 0  ?????? valACYclovir (  VALTREX) 500 mg tablet Take 1 Tab by mouth every 12 hours. 40  Tab 5    No current facility-administered medications for this visit.      ROS        Objective:  BP 124/81 (BP Site: Left arm, BP Position: Sitting)    Pulse 72    Wt 202 lb  (91.6 kg)    LMP 04/12/2008    BMI 30.71 kg/m??????  Physical Exam  Abdominal: There is no tenderness.  Genitourinary: Vagina normal and uterus normal.  Genitourinary Comments: No masses pap done        Assessment:      ICD-10-CM  1. HSV (herpes simplex virus) anogenital infection A60.9  2. Abnormal Pap smear of vagina R87.629 PAP WITH HPV DNA  3. Hepatitis C virus infection without hepatic coma, unspecified chronicity  B19.20 HEPATITIS C AB WITH REFLEX TO HCV,RNA,QUANT PCR    HEPATITIS C RNA QUANTITATIVE      Plan:    Orders Placed This Encounter  Procedures  ?????? Hepatitis C Ab - HEP C  ?????? Hepatitis C RNA quantitative  ?????? Thin Prep Pap Cotest HPV - Over 30    Encounter Meds  New Prescriptions   VALACYCLOVIR (VALTREX) 500 MG TABLET    Take 1 Tab by mouth every 12 hours.      Quantity: 40 Tab      Notes: --    Counseled patient about call with pap. dsicussed valtrex for prn hsv outbreaks  and pt wants to be tested for hep c as she was exposed. pt will be moving to  [employer]  Treatment Recommendations:      Total time with pt was 15 min with >50% of time spent face to face counseling  pap,hepc and hsv

## 2017-09-10 NOTE — Telephone Encounter (Signed)
Patient calls to follow up on message left earlier for Provider.   Advised patient of Provider message.  Patient verbalized understanding.

## 2017-09-10 NOTE — Unmapped (Signed)
Pt did cleanse yesterday but not completely cleared out.  Asking if Linzess should be started today as directed.    Wanda Rowe, Wanda Rowe (Self) [phone] (M)

## 2017-09-14 ENCOUNTER — Ambulatory Visit: Admit: 2017-09-14 | Discharge: 2017-09-14 | Payer: PRIVATE HEALTH INSURANCE | Attending: Family

## 2017-09-14 DIAGNOSIS — G4733 Obstructive sleep apnea (adult) (pediatric): Secondary | ICD-10-CM

## 2017-09-14 NOTE — Unmapped (Signed)
Chief Complaint: OSa on CPAP moving to [place]     Referral made by: Dr. Rico Junker, [place] .     Patient name: Wanda Rowe   Date of birth: [birth year]  Referring provider: Lynelle Doctor, CNP  Date of study: 10.24.18  Impressions:  1. Obstructive sleep apnea.  AHI 18 per hour.  a. Moderate frequency of obstructive respiratory events.  b. Mild to occasionally severe obstructive respiratory event associated oxyhemoglobin desaturations.  c. Minimal density of moderate or severe oxyhemoglobin desaturation events.  2. Snorer.  3. Mostly supine observation on this study.        Methodology: A Respironics Night 1 recording device was utilized. Recording parameters included airflow, thorax respiratory effort, continuous oxyhemoglobin saturation, heart rate, body position and patient event marker channels. Please see the technical report.  Data interpretation:    Technical quality was adequate for interpretation.  Total monitoring time was 489 minutes which included 435 minutes in the supine position.  Obstructive respiratory events were present.  There were 149 obstructive apneas and hypopneas.  The AHI was 18 per hour.  Obstructive respiratory events occurring in both the supine and nonsupine positions.  Mild to occasionally severe obstructive respiratory event associated oxyhemoglobin desaturations.  Snoring was present.  Baseline oxyhemoglobin saturation was normal at 97 percent.  The oxyhemoglobin saturation nadir for this study was 79 percent.  No cardiac dysrhythmias were identified.                 Georgian Co, M.D.    HPI: [name] is a 57 y.o. female with OSA on CPAP. She having problems with her mask keeps taking it off in the middle of the night. Looked at the mask looks too big. Having her try a small nasal pillow and see if she does not do better. She is moving to [place] in 3 weeks. She has called [facility] x to [facility] for supplies and she has not received any as of yet. She is very  frustartated with them. She likes her pressure. She has no rain out or dry mouth.  No snoring on CPAP, no excessive daytime sleepiness and no drowsy driving.   Started Linzess and nasaocort since her last visit  She thinks she can tell when she wakes up her sleep has been better.   She is no longer napping, not waking up as frequently at night and getting more hrs of sleep then before CPAP    Set up date 01/05/2017  Compliance Data Download: 08/08/2017-09/06/2017  Device Model: RESMED S10  Mask: swift fx med  PAP Pressure: min 5 max13 cm H2O average pressure 10.8 cmH2O  Percent Days with Device Usage: 93 %  Percent of Days with usage >= 4 hours: 37 %  Average Usage (number of hours used all days): 4 hrs 2 min  AHI: 2.8  Leak average 5    Initial hx of anesthesiologist told her she has sleep apnea and needs to be evaluated and she has observed apnea, loud snoring, and snorting. She complains of excessive daytime sleepiness and naps for about 1-2 hrs.  She has no problems falling asleep but is a restless sleeper and wakes up about 1-2 x per night. She wakes up un-refreshed.     Driving while drowsy:  Denies veering to the side of the road,accidents, or near-accidents due to sleepiness while driving    Sleep Schedule  Circadian rhythm disorder due to shift work No  Use sleeping aids No  Thinks that interfere with sleep  No Heat, No cold, Yes light, No noise, No partner, No kids, Yes pets, No reflux,   No nocturia,  No leg movements, No night sweats, No pain interferes with sleep     Bedtime is 10-11p  on weekdays and same  on weekends. Falls asleep within 10 minutes, with 1-2 nighttime awakenings due too check phone and let the dog put. Falls back asleep in 15 minutes. Awakens at 7-8 am during the week and 8-9 am on the weekends.     bed partner pets  Patient thinks they get approximately 7 hours of sleep a night.    Nap 0  Patient's desired sleep schedule would be 10p-8a  Pt lives with dogs  Occupation attorney  Divorced 1  child    Sleep Habits    No drink caffeine 6 hours before bedtime   No exercise before bedtime    Yes Watch TV before falling asleep,    Yes use electronic devices before falling asleep or during the night    Unusual sleep activity:   Yes does  remember her dreams. Yes  vivid dreams. Yes chase type dreams.Yes  nightmares. No dream enactment.   No cataplexy,   No sleep paralysis   No hypnagogic or hypnopompic hallucinations   No enuresis    Yes sleep talking    No sleep walking    No sleep eating   Yes bruxism uses a mouth gard  Yes TMJ uses a mouth guard no adjusting   No RLS   Yes kicking while asleep.      No closed head injuries   No Seizures    No clastrophobia  Tonsils have been removed No  Deviated septum  No  Problems with nasal congestion Yes  palpitations yes  Night sweats no  Decreased sex drive no  Dentures no good condition'  Anxiety- yes      EPWORTH SLEEPINESS SCALE: (at or above 10 is abnormal and 24 is the maximum score)  Epworth Sleepiness Scale 12/10/2016 02/04/2017 05/11/2017 09/14/2017   Sitting and reading 1 1 1  0   Watching TV 1 2 1  0   Sitting, inactive in a public place (e.g. a theatre or a meeting) 1 1 1 1    As a passenger in a car for an hour without a break 1 1 1  0   Lying down to rest in the afternoon when circumstances permit 3 3 2 2    Sitting and talking to someone 0 0 0 0   Sitting quietly after a lunch without alcohol 0 0 0 0   In a car, while stopped for a few minutes in traffic 0 0 0 0   Total score 7 8 6 3        .Previous Report(s) Reviewed: historical medical records, lab reports and office notes   The following portions of the patient's history were reviewed and updated as appropriate: allergies, current medications, past family history, past medical history, past social history, past surgical history and problem list.    SOCIAL HX:  Social History     Social History   ??? Marital status: Single     Spouse name: N/A   ??? Number of children: N/A   ??? 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 Smoking status: Never Smoker   ??? Smokeless tobacco: Never Used  Comment: (06/23/2006)   ??? Alcohol use Yes      Comment: Occasionally (06/23/2006)   ??? Drug use: No   ??? Sexual activity: Not Asked     Other Topics Concern   ??? Caffeine Use Yes     some   ??? Occupational Exposure No   ??? Exercise Yes     some   ??? Seat Belt Yes     Social History Narrative   ??? None        Past Medical Hx:   Past Medical History:   Diagnosis Date   ??? Anemia    ??? Anxiety    ??? Asthma     Mild   ??? Breast cancer (CMS Dx)       Right Breast   ??? Diabetes mellitus (CMS Dx)     Type 2   resolved with gastric sleeve   ??? DVT (deep vein thrombosis) in pregnancy (CMS Dx)    ??? GERD (gastroesophageal reflux disease)    ??? Hyperlipidemia    ??? Hypertension    ??? Hypothyroidism    ??? Mitral valve prolapse    ??? Polyp of stomach    ??? RSD (reflex sympathetic dystrophy)     R knee     SurgHx:   Past Surgical History:   Procedure Laterality Date   ??? ANTERIOR CRUCIATE LIGAMENT REPAIR      L    ??? birth mark      benign on buttocks deep   ??? BREAST BIOPSY Left    ??? BREAST SURGERY Right     Lumpectomy chemo and radiation   ??? COLONOSCOPY     ??? COLONOSCOPY W/ POLYPECTOMY      benign   ??? COLPOSCOPY     ??? DENTAL SURGERY      Periodontal    ??? ESOPHAGOGASTRODUODENOSCOPY N/A 07/19/2015    Procedure: ESOPHAGOGASTRODUODENOSCOPY WITH COLONOSCOPY WITH MAC;  Surgeon: Lurene Shadow, MD;  Location: [facility] ENDOSCOPY;  Service: Gastroenterology;  Laterality: N/A;   ??? FOOT SURGERY     ??? gastric sleeve     ??? HYSTERECTOMY     ??? IR IVC FILTER PLACE  05/22/2016   ??? JOINT REPLACEMENT Left     Knee   ??? KNEE SURGERY  05/2016    Left Knee Replacement   ??? MENISCECTOMY      L x 2, R x 1   ??? TYMPANOSTOMY TUBE PLACEMENT     ??? WISDOM TOOTH EXTRACTION       CURRENT MEDICATIONS:   Current Outpatient Prescriptions   Medication Sig   ??? albuterol Inhale 1-2 puffs into the lungs every 6 hours as needed.   ??? apixaban Take 5 mg by mouth 2 times a  day.   ??? CALCIUM CITRATE ORAL Take 2 tablets by mouth daily.          ??? cholecalciferol (vitamin D3) Take 1 capsule by mouth daily.   ??? DULoxetine Take 1 capsule (60 mg total) by mouth daily.   ??? gabapentin Take 1 tablet (600 mg total) by mouth 3 times a day.   ??? ipratropium Use 2 sprays into each nostril 2 times a day.   ??? LACTOBACILLUS ACIDOPHILUS (PROBIOTIC ORAL) Take 1 capsule by mouth daily.   ??? levothyroxine Take 1 tablet (50 mcg total) by mouth every morning before breakfast.   ??? linaCLOtide Take 1 capsule (290 mcg total) by mouth daily. Indications: Constipation Predominant Irritable Bowel Syndrome   ??? triamcinolone Use 2  sprays into each nostril daily.   ??? vit b complex w-b 12 Take 1 tablet by mouth daily.     No current facility-administered medications for this visit.       Outpatient Medications Prior to Visit   Medication Sig Dispense Refill   ??? albuterol (PROVENTIL;VENTOLIN;PROAIR) 90 mcg/actuation inhaler Inhale 1-2 puffs into the lungs every 6 hours as needed. 1 Inhaler 3   ??? apixaban (ELIQUIS) 5 mg Tab Take 5 mg by mouth 2 times a day.     ??? CALCIUM CITRATE ORAL Take 2 tablets by mouth daily.            ??? cholecalciferol, vitamin D3, 2,000 unit Cap Take 1 capsule by mouth daily. 1 capsule 0   ??? DULoxetine (CYMBALTA) 60 MG capsule Take 1 capsule (60 mg total) by mouth daily. 30 capsule 11   ??? gabapentin (NEURONTIN) 600 MG tablet Take 1 tablet (600 mg total) by mouth 3 times a day. 270 tablet 11   ??? ipratropium (ATROVENT) 0.03 % nasal spray Use 2 sprays into each nostril 2 times a day. 30 mL 3   ??? LACTOBACILLUS ACIDOPHILUS (PROBIOTIC ORAL) Take 1 capsule by mouth daily.     ??? levothyroxine (SYNTHROID, LEVOTHROID) 50 MCG tablet Take 1 tablet (50 mcg total) by mouth every morning before breakfast. 30 tablet 11   ??? linaCLOtide (LINZESS) 290 mcg Cap Take 1 capsule (290 mcg total) by mouth daily. Indications: Constipation Predominant Irritable Bowel Syndrome 30 capsule 2   ??? vit b complex w-b 12 tablet  Take 1 tablet by mouth daily. 30 tablet 0   ??? blood sugar diagnostic (ACCU-CHEK AVIVA PLUS TEST STRP) Strp fsbs qd 100 strip 1   ??? blood-glucose meter (GLUCOSE MONITORING KIT) kit by Other route 2 (two) times daily. Use as instructed - fsbs bid      ??? fluticasone (FLONASE) 50 mcg/actuation nasal spray Use 2 sprays into each nostril daily. 16 g 5     No facility-administered medications prior to visit.      Allergies as of 09/14/2017 - Fully Reviewed 09/14/2017   Allergen Reaction Noted   ??? Succinylcholine Hives 01/24/2013   ??? Zithromax [azithromycin]     ??? Silver Rash 01/24/2013   ??? Sulfa (sulfonamide antibiotics) Swelling and Rash    ??? Sulfanilamide Rash 01/24/2013     Family Hx:   Family History   Problem Relation Age of Onset   ??? Hypertension Mother    ??? Other Mother         DDD   ??? Diabetes Father         Type 2   ??? Hypertension Father    ??? Factor VIII deficiency Father    ??? Other Brother         Back Pain   ??? Diabetes Maternal Grandmother         Type 2   ??? Cancer Maternal Grandmother         Bladder CA   ??? Stroke Maternal Grandmother    ??? Colon Cancer Maternal Grandfather    ??? Breast Cancer Paternal Grandmother        Review of Systems  General ROS: waking up several times a night  Ophthalmic ROS: positive for - uses glasses decreased vision   Ears, nose, mouth, throat, and face ROS: dry mouth  Respiratory ROS: negative  Cardiovascular ROS: negative  Gastrointestinal ZOX:[id number]  Genitourinary BJY:NWGNFAOZ for nocturia 1 x per night  Integument/breast ROS: negative  Hematologic/lymphatic ROS: negative  Musculoskeletal ZOX:WRUEAVWU for arthralgias, neck pain and stiff joints  Neurological ROS: negative  Psychological ROS: negative  Endocrine ROS: all  negative  Allergy and Immunology ROS: positive for - see list      Denies: suicidal ideas, irritability, depression, racing mind, changes in appetite, fainting spells, alcoholism, tremors, using illegal drugs, worrying about inability to sleep, insomnia,  shortness of breath,  lower extremity edema,    I have reviewed the review of system and advised patient to contact primary care physician or specialist for non sleep related issues.    ROS  has been reviewed and updated with the patient during this visit.     Objective:     Wt Readings from Last 3 Encounters:   09/14/17 203 lb 12.8 oz (92.4 kg)   09/06/17 203 lb (92.1 kg)   07/15/17 195 lb (88.5 kg)     BP 116/78    Pulse 74    Resp 16    Ht 5' 7.01 (1.702 m)    Wt 203 lb 12.8 oz (92.4 kg)    SpO2 100%    BMI 31.91 kg/m??  personally reviewed    Physical Exam   Constitutional:  alert, and in no apparent distress. Well-developed and well-nourished.   Head: normocephalic and atraumatic.   Ears: appear normal externally bilaterally  Eyes: Conjunctivae are normal. Pupils are equal, round, no scleral icterus, drainage or redness.   Nose: Columella normal.  Negative for nasal congestion. No deviated septum.   Mouth : Mallampati 3. Crowded airway. Oropharynx is clear, moist and pink. Uvula thick and elongated,  Midline. Tonsils unable to visualize. Tongue normal. Dentition good.  Neck: Supple, Grossly normal range of motion. . Trachea midline.    Cardiovascular: normal rate and regular rhythm. Neg murmur.    Pulmonary/Chest: effort normal and breath sounds normal. No stridor. No respiratory distress, no wheezes, no rales.   Abdominal: deferred   Musculoskeletal: Grossly normal range of motion and strength. Normal gait.    Neurological:  alert. Cranial nerves II-XII grossly intact. Strength 5/5 in upper  extremities bilaterally.  No tremor.   Skin: skin is warm and dry. No rash noted.   Psychiatric: normal speech, normal mood and affect, behavior is normal. Judgment and thought content normal.     Labs Reviewed:   Lab Results   Component Value Date    IRON 70 07/12/2017    TIBC 365 04/28/2008    FERRITIN 159 (H) 01/05/2017     Lab Results   Component Value Date    WBC 5.0 01/05/2017    HGB 12.1 01/05/2017    HCT 35.4  01/05/2017    MCV 88 01/05/2017    PLT 283 01/05/2017     Lab Results   Component Value Date    GLUCOSE 84 07/12/2017    BUN 19 07/12/2017    CO2 25 07/12/2017    CREATININE 0.58 07/12/2017    K 4.1 07/12/2017    NA 143 07/12/2017    CL 103 07/12/2017    CALCIUM 9.8 07/12/2017     Lab Results   Component Value Date    HGBA1C 4.9 09/18/2016     Lab Results   Component Value Date    TSH 1.100 07/12/2017    T3TOTAL 93 01/05/2017    T3FREE 2.5 01/18/2014    FREET4 0.97 07/01/2016    THYROIDAB 1 11/10/2013         Assessment     Assessment:  OSA on CPAP. She having problems with her mask keeps taking it off in the middle of the night. Looked at the mask looks too big. Having her try a small nasal pillow and see if she does not do better. She is moving to [place] in 3 weeks. She has [facility] to [facility] for supplies and she has not received any as of yet. She is very frustartated with them. She likes her pressure. She has no rain out or dry mouth.  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    Body mass index is 31.91 kg/m??.     Breast cancer s/p breast surgery chemo and radiation, Anxiety, nerve disorder, mitral valve prolaps, gastric sleeve, GERD, IBS, anemia, hx DVT has filter (blood clots)   Plan:   1. OSA  Reviewed download with pt  Answered questions-mostly about when she gets supplies and DME  Christoper Allegra also goes to [employer] [place] she will call them once she has moved and we can see if we cant get her set up with them   Gave her a small nasal pillow to try tonight     auto CPAP min 5 max 13 heated hose and mask    DME [facility] may be switching to [facility]    Reviewed DME, CPAP, mask policy, humidification, RAMP, heated tubing, cleaning instructions, and when he  can get supplies. Talked about we have 90 days to meet compliance required by the insurance companies. They require you using the device more than 4 hours 70 %. Within the first 90 days or the DME will take away the device per some insurance company rules.  Some insurance companies also require a office visit between day 31 and 90 day. Handouts given. Instructed pt to review before going to the DME, so you know what questions to ask, and make sure the respiratory therapist shows you how to use the features of the device.      2. Patient was educated regarding avoiding driving while sleepy, specifically avoiding long distance or night driving pending successful treatment of this condition. Guidelines for tips to avoid drowsy driving were provided. Pt aware this is their responsibility not to drive drowsy and find another form of transportation.     3. Encouraged weight loss and exercise    Follow up appointment recommended she will need to get a provider down in [place] where she is moving.            SLEEP QUALITY MEASURES:   Obstructive Sleep Apnea Follow Up Visit:   Adherence to therapy documented at least annually?: Yes    Reassessment of excessive daytime sleepiness?: Yes    Weight / body mass index assessment at visit?: Yes    If body mass index greater than 25 was discussion of weight management discussed? : Yes    Blood pressure assessed at visit? : Yes          Medical Decision Making:   The following items were considered in medical decision making:   Review / order other diagnostic tests/interventions   Risks & Benefits:   Risks, benefits and treatment options discussed with patient.

## 2017-09-14 NOTE — Unmapped (Signed)
Key points to remember  ?? You should wear the device every time you sleep, no matter when you sleep or where you sleep. When you nap or travel or go on vacation you need to use the machine when asleep.   ?? Bring your device, mask, hose, power cord etc to every office visit.   ?? If you sleep without the device your Obstructive Sleep Apnea (OSA) is untreated and your risk of heart attacks and strokes are higher.   ?? Clean the device on a regularly basis to avoid sinus infections and pneumonias   ?? Never travel with water in the reservoir   ?? Replace supplies on a regularly basis (see schedule below). You may need to contact your Durable Medical Equipment company (DME) and request supplies (just like you would medications from a pharmacy).  ?? Alcohol will worsen OSA, Alcohol can decrease your drive to breathe, slowing your breathing and making your breaths shallow. In addition, it may relax the muscles of your throat, which may make it more likely for your upper airway to collapse. REMEMBER TO ALWAYS WEAR YOUR PAP DEVICE WHEN SLEEPING.   ?? Certain medications can also worsen OSA such as some pain medications, anti-anxiety medications, muscle relaxants, testosterone supplementation. Please inform your sleep provider which medications you take on a regular basis.   ?? All patients are advised to make follow up appointments with the provider after they are set up with their PAP device. Most insurance plans now require the patient set up a follow up appointment with the provider 31-90 days after they have been set up with their device and at least once a year thereafter. Discuss these issues with your provider for more details.   ?? Please note when you are provided with a mask you have a manufacturer 30 day guarantee that comes with it. If you do not like that mask for any reason contact your DME and request an exchange. After 30 days you may not be eligible for a new mask for another 3-6 months depending on insurance. Talk  to your DME or sleep provider for more details.   ?? If you are struggling with nasal congestion try to use a nasal sinus rinse available over the counter such as Netti Pot 1 hour before bedtime (you may have to use it daily for a 7-10 days then as needed). You can also try over the counter Flonase or Nasacort 1-2 sprays in each nostril at bedtime.   ?? If you ever have problems with the device or the plan discussed with your sleep provider please contact your provider by phone or using MyChart. Thank you      CLEANING INSTRUCTIONS FOR PAP EQUIPMENT     Keeping your equipment and supplies clean is very important.     REMINDER: Only use DISTILLED WATER in your humidifier, Empty water daily.    DAILY  Mask and tubing:   ?? Wash your face before applying mask  ?? Wash mask and tubing with baby shampoo and warm water.     Humidifier:   ?? Empty water in reservoir  ?? Clean with baby shampoo and warm water  ?? Rinse, then air dry    WEEKLY  Mask and tubing:   ?? Soak your mask and tubing in 1 part vinegar and 3 parts water for 30 minutes. Rinse, and allow to air dry.     Humidifier:   ?? Wash with warm water and baby shampoo  ?? Soak in   1 part vinegar and 3 parts water for 30 minutes  ?? Rinse with warm water and allow to air dry    Machine Exterior:   ?? Wipe with a clean damp cloth    MONTHLY AND/OR AS NEEDED  ?? Reusable foam filters (black filter)- wash in warm water with baby shampoo. Rinse well and dry with paper towel  ?? Disposable felt filter (white filter)- Replace filter every two weeks to once a month    NOTE: If you are having repeated sinus and /or respiratory infections, dirty equipment may be the cause. It may help to clean and disinfect your equipment more frequently    TRAVELING  Always make sure the humidifier is empty when traveling. (including doctor appointments, air travel or long distance driving).  When flying, always carry your PAP device with you as a carry on item, NEVER check it in as baggage. We can  provide you with a letter stating it is a medical device, talk to your provider.    Always make sure you have your mask and tubing with you. You will need appropriate plug adapters when traveling outside the [place].    If travelling by air and you are unable to carry distilled water with you use bottled water (no more than 2 weeks). DO NOT USE TAP WATER.       Equipment Replacement Schedule    To get the most benefit from your PAP therapy, your equipment should be replaced when necessary based on wear and tear. For example, your mask may need to be replaced if you notice it is cracked or the seal is leaking. If your tubing is torn, it needs to be replaced.   If your equipment is showing signs of wear, you may be entitled to replace it. The replacement schedule for Medicare patients is shown below. If you are NOT a Medicare patient, please check with your DME (Durable Medical Supply) provider for your individual insurance policy???s replacement schedule.     Supplies   Medicare Medicaid My Insurance Plan  Mask    1 per 3 m 1 per year _______________  Nasal cushion  2 per m 1 per 6 m _______________  Pillows cushion  2 per m 1per 6 m _______________  Full-face cushion  1 per m 1per 6 m _______________  Headgear   1 per 6 m 1 per year _______________  Water Chamber  1 per 6 m N/A  _______________  Chinstrap   1 per 6 m 1 per 6 m       _______________  Tubing/Hose   1 per 3 m 1 per year _______________  Heater wire tubing  1 per 3 m N/A  _______________  Filter, disposable (white) 2 per m 1 per m _______________  Filter, particle foam (black) 1 per 6 m 4 per year _______________  Therapy device  1 per 5 yrs  ?  _______________    Follow up and compliance goals for Medicare and Medicaid patients   (most private insurances are now following similar rules)  Medicare and Medicaid require patients to follow-up with their provider after they are set up with their PAP equipment. The follow up appointment should be within  31-90 days after receiving their equipment. If patient does not follow up as directed there might be issues with the cost of the device being covered by the insurance. Therefore please ensure you make a follow up appointment with your sleep provider.      Insurance   also requires that the patient meet some compliance goals such as the download should indicate patient has been wearing the device for at least 4 hours per night AND they are using the device for 30 nights in a row.      HUMIDIFICATION feature on your PAP device. Most machines are pre-set for a humidity setting of 3 or 4. You can manually change the humidifier settings on your machine when you turn the machine on. Increase the humidity level one increment at a time.  Respironics device: Turn the on/off button to the left for less humidification or the right for more humidification. Humidification ranges from 1-5. If you have a heated hose talk to your provider or DME who can teach you how to make adjustments.     F&P device: Turn the large dial to the right for higher level and left for lower level. Humidification ranges from 1-7.     ResMed S9 device: Attach the humidifier component to the main unit, turn   the dial and choose the water droplet icon; press the button once. Then turn the dial to adjust your humidity setting. Press button a final time to save the setting. Humidification ranges from 1-8        Resmed S10 Device - This device has an Auto humidification feature.    If you have a heated hose it defaults to Auto Setting. You can change it to           manual setting under My Options. Switch Climate Control from Auto to            Manual. Then you will see two other options pop up. Tube Temperature and Humidity Level. Tube temperature is like your thermostat how hot or cold you want the air to be. It can range from 60-86 degree (most defaults to 81 degrees). Humidity Level is the next option this is the Moisture that is captured from the  distilled water placed in the chamber. It goes from 1-8. Higher the # more moister. SEE THIS VIDEO https://www.youtube.com/watch?v=[email or web address]&sns=em         For example, if you are waking up with a dry mouth, you should turn your humidifier up one number at a time. If you are getting excess condensation (water droplets) in your mask or tubing, you should turn the humidifier down one number at a time. In summer you need less humidity in winter you need more humidity. THIS NUMBER RARELY STAYS THE SAME ALL YEAR LONG. YOU HAVE TO CHANGE IT DEPENDING ON HOW YOU FEEL AND WHAT THE WEATHER IS OUTSIDE.     A prescription will be sent to a DME (medical supply company) covered by your insurance. The DME Company should contact you within 7-10 business days once they receive the prescription. If you have not received a call from the DME Company regarding your device, PLEASE CALL US so we may investigate further.   DROWSY DRIVING TIPS    These suggestions will help prevent you from the risk of drowsy driving.     1. If you feel tired or drowsy don't drive. Sleepiness is a major cause of motor vehicle accidents and accounts for 40% of all fatal crashes reported on the [place] thru[place]. No matter how much you think you can control sleepiness, you can't.     2. Ensure you follow your doctor's advice about the treatment for your sleep disorder. For example, if you have sleep apnea and use CPAP, ensure you use it   fully the night before your trip.     3. Get a good night's sleep before driving. Do not cut yourself short of sleep if you plan a long drive the next day. Get to bed early and do not stay up late packing.     4. Avoid alcohol both the night before your trip and during your trip. Alcohol will disrupt sleep and make you more tired the next day. Sleepiness and alcohol are additive in increasing impairment of your driving ability.     5. Avoid any sedative medications, including sedative antihistamines that are often contained in  cold or allergy medications, the night before you drive as they may have long lasting effects the next day.     6. Travel during non-sleeping hours. Accidents due to sleepiness are more common during the nighttime hours.     7. If sleepy, stop and rest. Drink coffee and walk around. Take a brief nap, lock your car doors and take a nap in your car if you are sleepy. Have a 10-15 minute break after every 2 hours of driving.     8. Drive with a companion. Share the driving. Relax in the back seat until it is your time to share the driving again.     These are only guidelines; please discuss your diagnosis and treatment with your sleep medicine provider. Drive safely.       Sleep Hygiene     Sleep disruption is common, especially during times when you may feel emotionally overwhelmed. Anxieties, relentless replay of the day???s events and heightened emotions may significantly interfere with your sleep. Lack of sleep robs you of needed rest, making management of your illness more difficult.   Bring sleep patterns under control and working at a consistent, stable pattern is very important to illness management. You need your rest. The most common cause of insomnia is a change in your daily routine. For example traveling, change in work hours, disruption of other behaviors (eating, exercise, leisure, etc.), relationship conflicts may cause sleep problems. Paying attention to good sleep hygiene is the most important thing you can do to maintain good sleep.     Do:   1. Go to bed at the same time each day.   2. Get up from bed at the same time each day.   3. Get regular exercise each day, preferably in the morning. There is good evidence that regular exercise improves restful sleep. This includes stretching and aerobic exercise.   4. Get regular exposure to outdoor or bright lights, especially in the late afternoon.   5. Keep the temperature in your bedroom comfortable.   6. Keep the bedroom quiet when sleeping   7. Keep the  bedroom dark enough to facilitate sleep.   8. Use your bed only for sleep and sex.   9. Take medications as directed. It???s often helpful to take prescribed sleeping pills one hour before bedtime, so they are causing drowsiness when you lie down, or 10 hours before getting up to avoid daytime drowsiness.   10. Use a relaxation exercise just before going to sleep, (muscle relaxation, imagery, massage, breathing exercises, warm baths).   11. Keep your feet and hands warm. Wear warm socks and/or mittens or gloves to bed.     Don???t:   1. Exercise just before going to bed.   2. Engage in stimulating activity just before bed, such as playing a competitive game, watching an exciting program on TV or movie, or having   an important discussion with a loved one.   3. Have caffeine in the evening (coffee, many teas, chocolate, sodas, etc). No caffeine after noon.    4. Read or watch TV in bed.   5. Use alcohol to help you sleep.   6. Go to bed too hungry or too full.   7. Take another person???s sleeping pills.   8. Take over-the-counter sleeping pills without your doctor???s knowledge. Tolerance can develop rapidly with these medications. Diphenhydramine (an ingredient commonly found in over-the-counter sleep medications) can have serious side effects for elderly patients.   9. Take daytime naps.   10. Command yourself to go to sleep. This only makes your mind and body more alert.   11. Do not look at the time when you wake up in the middle of the night. Remove the clocks from the bedroom or set your alarm for the morning and have it face the wall.   If you lie in bed awake for more than 20-30 minutes, get up, go to a different room (or different part of the bedroom), and participate in a quiet activity (non-excitable reading or listen to soothing music) then return to bed when you feel sleepy. Do this as many times during the night as needed.

## 2017-09-22 NOTE — Telephone Encounter (Signed)
Pt states that Linzess is not working. States that she tried to complete two cleanses which have not helped either. Pt states that she is starting to experience constipation again. Pt asking how she should proceed.

## 2017-09-23 NOTE — Telephone Encounter (Signed)
Returned call to patient after 3pm today.  No answer, left another VM.  Sent patient MyChart message today.

## 2017-09-23 NOTE — Telephone Encounter (Signed)
Pt called returning 2nd VM from CNP. Roxy Horseman. I informed patient that Barbette Or left her a message on mychart and patient states they do not know how to use mychart. Pt can be contacted at [phone]

## 2017-09-23 NOTE — Telephone Encounter (Signed)
Returned call to patient.  No answer, left VM.   When patient calls back can advise adding fiber tabs or Miralax to Linzess regimen.

## 2017-09-23 NOTE — Telephone Encounter (Signed)
Pt returning call from CNP [facility]. Pt can be contacted at [phone] after 3pm

## 2017-09-23 NOTE — Telephone Encounter (Signed)
Returned call to patient.  Completed miralax cleanse 09/09/17.  Felt like this was successful but didn't feel completely empty.  Then started Linzess and had diarrhea that following day.  Then had bloating and cramping but couldn't have a BM.  Hasn't had adequate BMs in close to 1 week.  Advised continuing Linzess and adding Miralax 1-2 doses/d for now.  She verbalized understanding and has follow up scheduled.

## 2017-10-11 ENCOUNTER — Encounter: Payer: PRIVATE HEALTH INSURANCE | Attending: Gerontology

## 2017-12-17 ENCOUNTER — Ambulatory Visit: Payer: Self-pay | Admitting: Allergy

## 2018-01-05 ENCOUNTER — Encounter: Payer: PRIVATE HEALTH INSURANCE | Attending: Radiation Oncology

## 2018-01-12 ENCOUNTER — Encounter: Payer: PRIVATE HEALTH INSURANCE | Attending: Radiation Oncology

## 2018-01-19 ENCOUNTER — Emergency Department (HOSPITAL_BASED_OUTPATIENT_CLINIC_OR_DEPARTMENT_OTHER)
Admission: EM | Admit: 2018-01-19 | Discharge: 2018-01-19 | Disposition: A | Discharge status: Home / Self Care | Payer: 59 | Attending: Emergency Medicine | Admitting: Emergency Medicine

## 2018-01-19 ENCOUNTER — Other Ambulatory Visit: Payer: Self-pay

## 2018-01-19 ENCOUNTER — Encounter (HOSPITAL_BASED_OUTPATIENT_CLINIC_OR_DEPARTMENT_OTHER): Payer: Self-pay

## 2018-01-19 ENCOUNTER — Emergency Department (HOSPITAL_BASED_OUTPATIENT_CLINIC_OR_DEPARTMENT_OTHER): Payer: 59

## 2018-01-19 DIAGNOSIS — Y999 Unspecified external cause status: Secondary | ICD-10-CM | POA: Diagnosis not present

## 2018-01-19 DIAGNOSIS — S0990XA Unspecified injury of head, initial encounter: Secondary | ICD-10-CM | POA: Insufficient documentation

## 2018-01-19 DIAGNOSIS — Y929 Unspecified place or not applicable: Secondary | ICD-10-CM | POA: Insufficient documentation

## 2018-01-19 DIAGNOSIS — Z7901 Long term (current) use of anticoagulants: Secondary | ICD-10-CM | POA: Insufficient documentation

## 2018-01-19 DIAGNOSIS — Z853 Personal history of malignant neoplasm of breast: Secondary | ICD-10-CM | POA: Insufficient documentation

## 2018-01-19 DIAGNOSIS — Y9389 Activity, other specified: Secondary | ICD-10-CM | POA: Diagnosis not present

## 2018-01-19 DIAGNOSIS — W01190A Fall on same level from slipping, tripping and stumbling with subsequent striking against furniture, initial encounter: Secondary | ICD-10-CM | POA: Insufficient documentation

## 2018-01-19 DIAGNOSIS — S0083XA Contusion of other part of head, initial encounter: Secondary | ICD-10-CM

## 2018-01-19 HISTORY — DX: Acute embolism and thrombosis of unspecified deep veins of unspecified lower extremity: I82.409

## 2018-01-19 HISTORY — DX: Disorder of thyroid, unspecified: E07.9

## 2018-01-19 HISTORY — DX: Malignant neoplasm of unspecified site of unspecified female breast: C50.919

## 2018-01-19 IMAGING — CT CT HEAD W/O CM
3 series · 14 of 47 positions shown, 16 images · non-contrast
Comparison: None.

CLINICAL DATA: Fall, RIGHT forehead laceration. On anticoagulation.
History of breast cancer.

EXAM:
CT HEAD WITHOUT CONTRAST
TECHNIQUE: Contiguous axial images were obtained from the base of the skull
through the vertex without intravenous contrast.

[Series 2: head wo · axial · 0.41mm/px · z∈[+565,+690]mm · 8 of 30 slices shown, 10 images]
[im 3/30  brain]
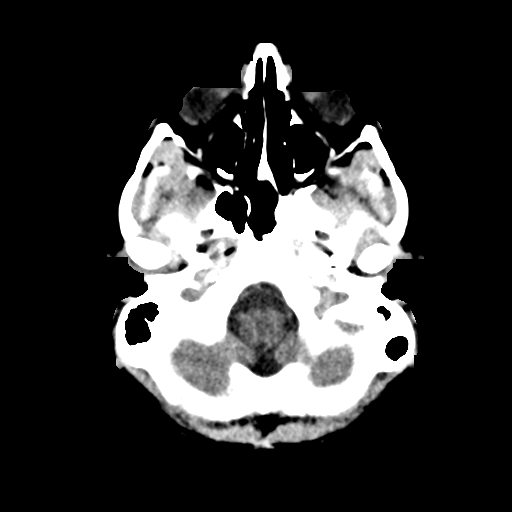
[im 3/30  bone]
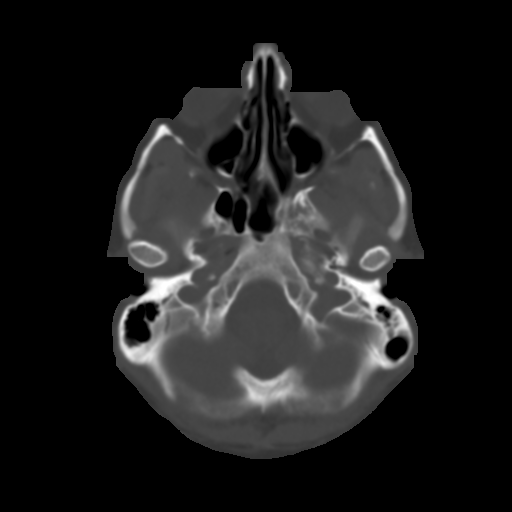
[im 7/30  brain]
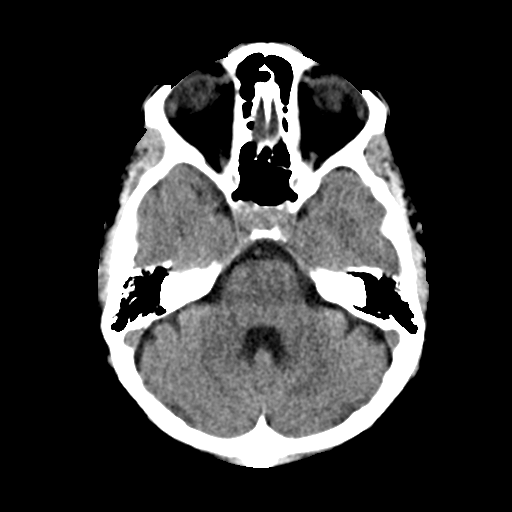
[im 10/30  brain]
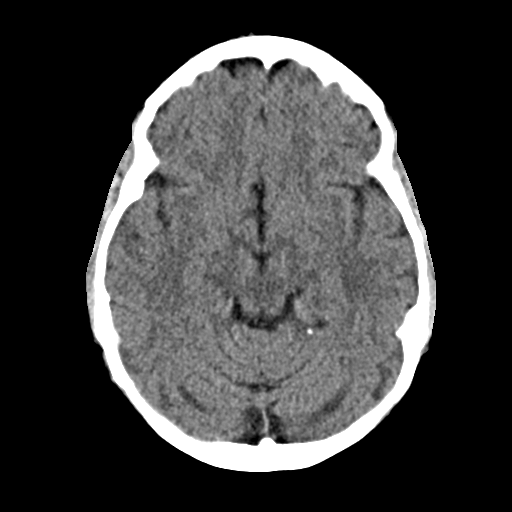
[im 14/30  brain]
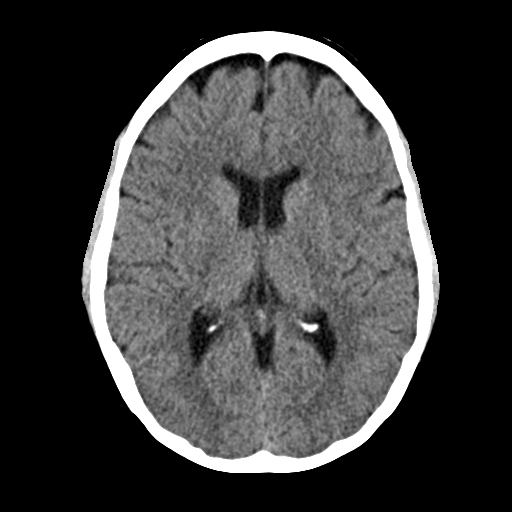
[im 17/30  brain]
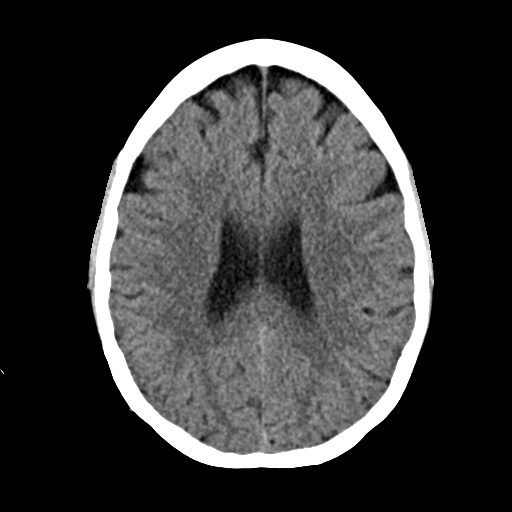
[im 17/30  bone]
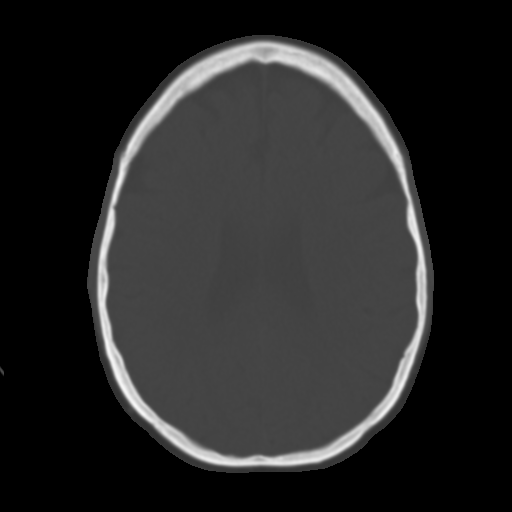
[im 21/30  brain]
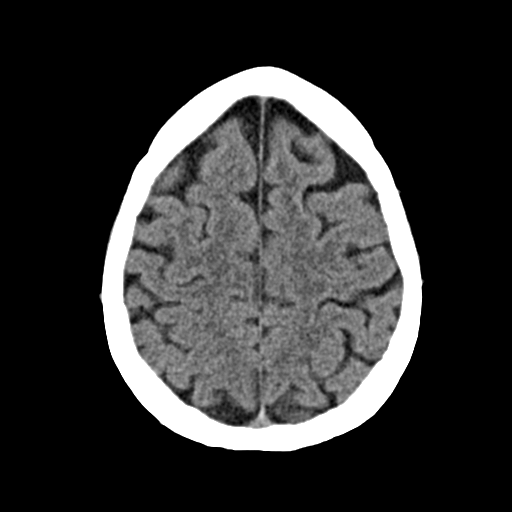
[im 24/30  brain]
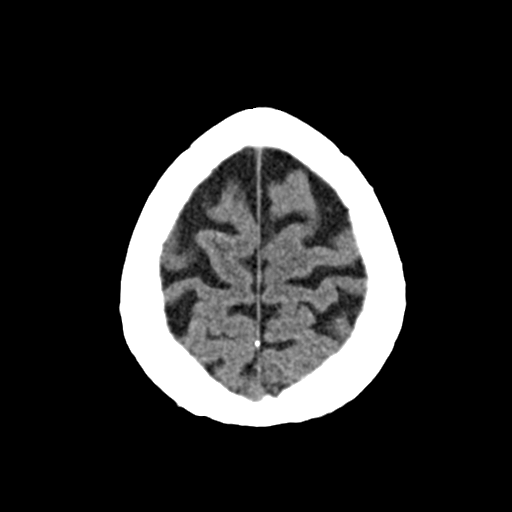
[im 28/30  brain]
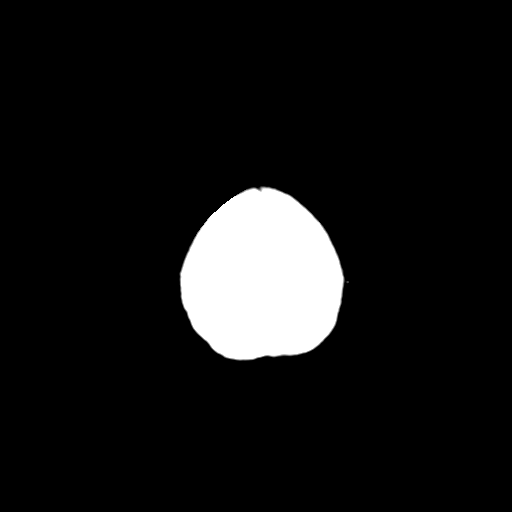

[Series 4: coronal soft · coronal · 0.32mm/px · 3 of 68 slices shown]
[im 23/68  brain]
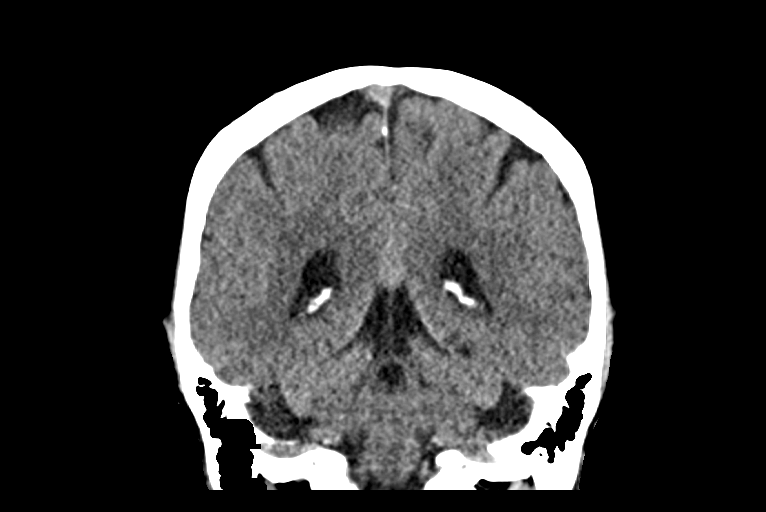
[im 30/68  brain]
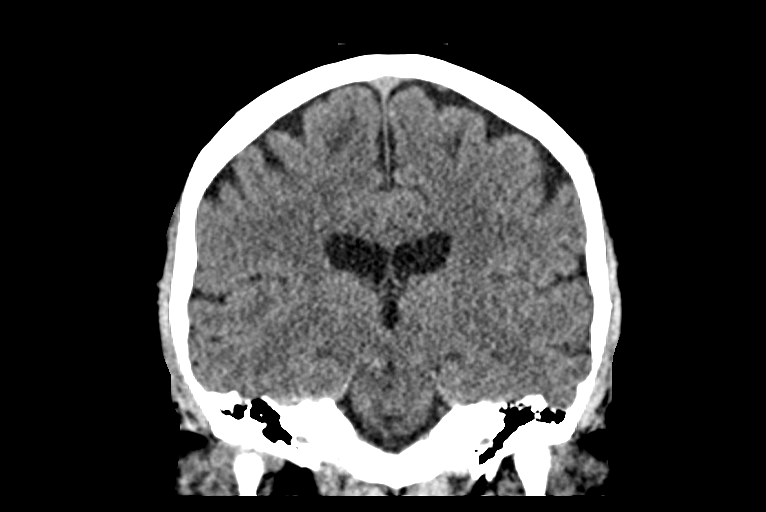
[im 38/68  brain]
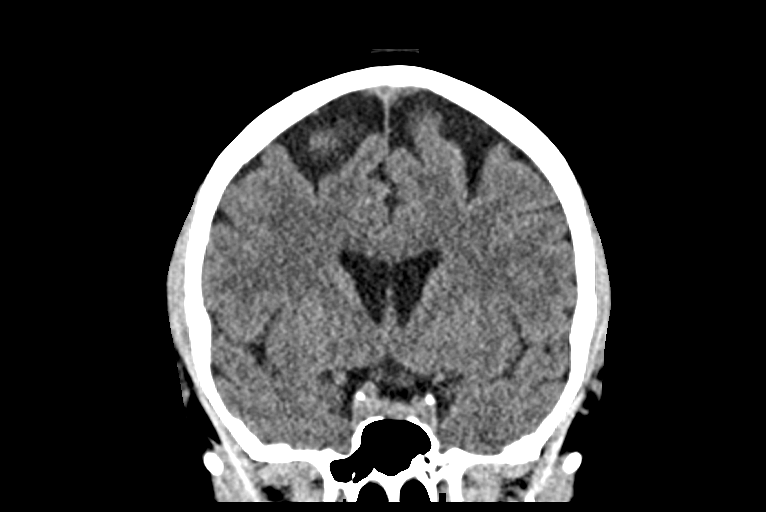

[Series 5: sag soft · sagittal · 0.28mm/px · 3 of 55 slices shown]
[im 19/55  brain]
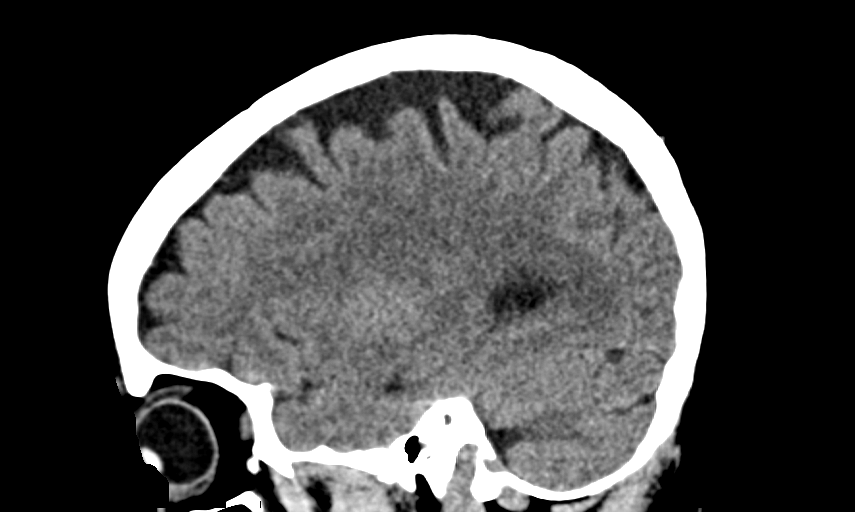
[im 28/55  brain]
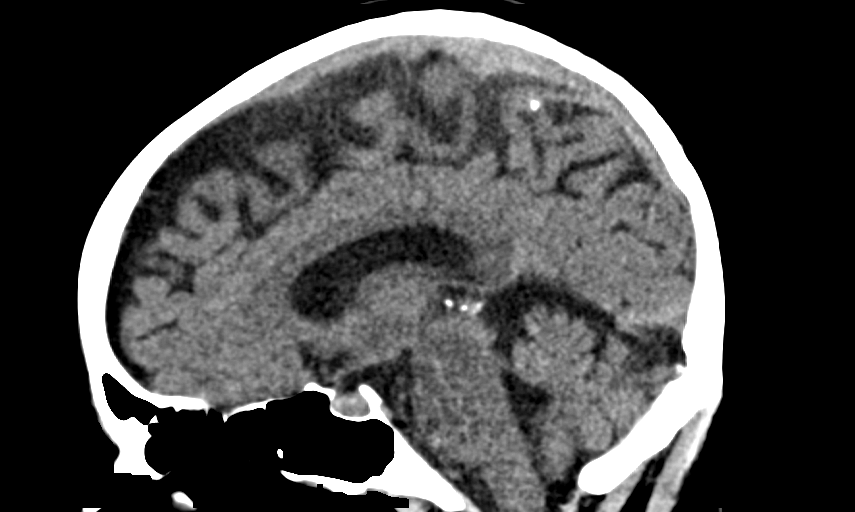
[im 37/55  brain]
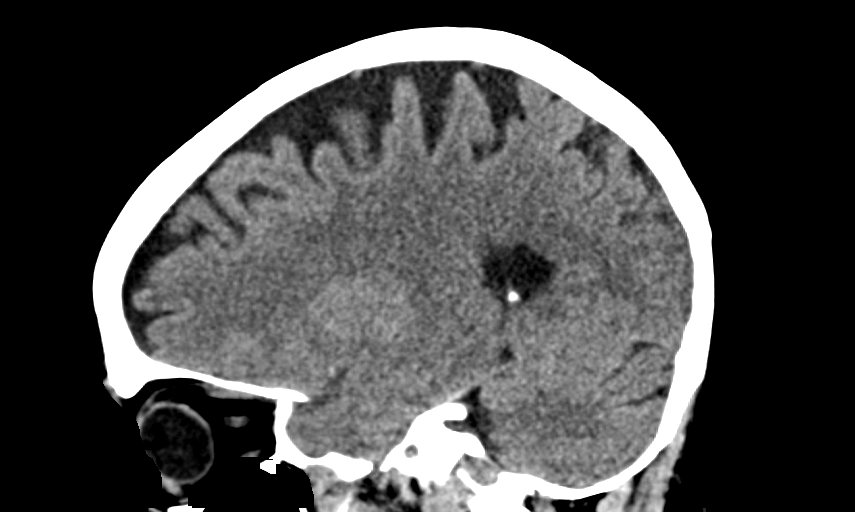

[14 of 47 positions shown; findings below may reference images not displayed]

FINDINGS: BRAIN: No intraparenchymal hemorrhage, mass effect nor midline
shift. The ventricles and sulci are normal. No acute large vascular
territory infarcts. No abnormal extra-axial fluid collections. Basal
cisterns are patent.

VASCULAR: Unremarkable.

SKULL/SOFT TISSUES: No skull fracture. Small RIGHT frontal scalp
hematoma. No subcutaneous gas or radiopaque foreign bodies.

ORBITS/SINUSES: The included ocular globes and orbital contents are
normal.Trace paranasal sinus mucosal thickening. Mastoid air cells
are well aerated.

OTHER: None.
IMPRESSION: 1. Small RIGHT frontal scalp hematoma. Otherwise negative
noncontrast CT HEAD.

## 2018-01-19 NOTE — ED Triage Notes (Signed)
Pt states she was playing with dog ~630p-hit forehead on corner chair-left forehead lac noted-no LOC-states she is concerned because she takes blood thinners-NAD-steady gait

## 2018-01-19 NOTE — ED Provider Notes (Signed)
[facility] DEPARTMENT Provider Note   CSN: [id number] Arrival date & time: 01/19/18  1950     History   Chief Complaint Chief Complaint  Patient presents with  . Head Injury    HPI Sierra Perez is a 57 y.o. female.  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  Patient states that she was playing with her dogs and leaned over and smacked her head on the kitchen chair causing it to bleed and pain.  She had no loss of consciousness or fall after that.  She has a mild headache now and the bleeding has stopped but she started looking through the Internet and because being on blood thinner it recommended she come to the emergency room.  She denies any neck pain, numbness or weakness in her extremities.  She has been able to walk without difficulty.  Vision is not significantly changed.  The history is provided by the patient.  Head Injury   The incident occurred 1 to 2 hours ago. She came to the ER via walk-in. The injury mechanism was a direct blow. There was no loss of consciousness. The volume of blood lost was minimal. The quality of the pain is described as sharp. The pain is at a severity of 1/10. The pain is mild. The pain has been constant since the injury. Pertinent negatives include no numbness, no blurred vision, no vomiting, no disorientation and no weakness. She has tried ice for the symptoms. The treatment provided moderate relief.    Past Medical History:  Diagnosis Date  . Breast cancer ([place])   . DVT (deep venous thrombosis) ([place])   . Thyroid disease     There are no active problems to display for this patient.   Past Surgical History:  Procedure Laterality Date  . ABDOMINAL HYSTERECTOMY    . BREAST SURGERY    . KNEE SURGERY       OB History   No obstetric history on file.      Home Medications    Prior to Admission medications   Not on File    Family History No  family history on file.  Social History Social History   Tobacco Use  . Smoking status: Never Smoker  . Smokeless tobacco: Never Used  Substance Use Topics  . Alcohol use: Yes    Comment: occ  . Drug use: Never     Allergies   Succinylcholine and Sulfa antibiotics   Review of Systems Review of Systems  Eyes: Negative for blurred vision.  Gastrointestinal: Negative for vomiting.  Neurological: Negative for weakness and numbness.  All other systems reviewed and are negative.    Physical Exam Updated Vital Signs BP (!) 157/74 (BP Location: Right Arm)   Pulse 81   Temp 98.1 F (36.7 C) (Oral)   Resp 18   Ht 5\' 8"  (1.727 m)   Wt 95.3 kg   SpO2 100%   BMI 31.93 kg/m   Physical Exam Vitals signs and nursing note reviewed.  Constitutional:      General: She is not in acute distress.    Appearance: She is well-developed.  HENT:     Head: Normocephalic. Abrasion and contusion present.      Right Ear: Tympanic membrane normal.     Left Ear: Tympanic membrane normal.     Nose: Nose normal.  Eyes:     Pupils: Pupils are equal, round, and reactive  to light.     Comments: Normal fundoscopic exam without papilledema or hemorrhage  Neck:     Musculoskeletal: Normal range of motion and neck supple. No neck rigidity or muscular tenderness.  Cardiovascular:     Rate and Rhythm: Normal rate and regular rhythm.     Heart sounds: Normal heart sounds. No murmur. No friction rub.  Pulmonary:     Effort: Pulmonary effort is normal.     Breath sounds: Normal breath sounds. No wheezing or rales.  Musculoskeletal: Normal range of motion.        General: No tenderness.     Comments: No edema  Skin:    General: Skin is warm and dry.     Findings: No rash.  Neurological:     Mental Status: She is alert and oriented to person, place, and time.     Cranial Nerves: No cranial nerve deficit.  Psychiatric:        Behavior: Behavior normal.      ED Treatments / Results   Labs (all labs ordered are listed, but only abnormal results are displayed) Labs Reviewed - No data to display  EKG None  Radiology Ct Head Wo Contrast  Result Date: 01/19/2018 CLINICAL DATA:  Fall, RIGHT forehead laceration. On anticoagulation. History of breast cancer. EXAM: CT HEAD WITHOUT CONTRAST TECHNIQUE: Contiguous axial images were obtained from the base of the skull through the vertex without intravenous contrast. COMPARISON:  None. FINDINGS: BRAIN: No intraparenchymal hemorrhage, mass effect nor midline shift. The ventricles and sulci are normal. No acute large vascular territory infarcts. No abnormal extra-axial fluid collections. Basal cisterns are patent. VASCULAR: Unremarkable. SKULL/SOFT TISSUES: No skull fracture. Small RIGHT frontal scalp hematoma. No subcutaneous gas or radiopaque foreign bodies. ORBITS/SINUSES: The included ocular globes and orbital contents are normal.Trace paranasal sinus mucosal thickening. Mastoid air cells are well aerated. OTHER: None. IMPRESSION: 1. Small RIGHT frontal scalp hematoma. Otherwise negative noncontrast CT HEAD. Electronically Signed   By: Elon Alas M.D.   On: 01/19/2018 20:43    Procedures Procedures (including critical care time) LACERATION REPAIR Performed by: [employer] Authorized by: Blanchie Dessert Consent: Verbal consent obtained. Risks and benefits: risks, benefits and alternatives were discussed Consent given by: patient Patient identity confirmed: provided demographic data Prepped and Draped in normal sterile fashion Wound explored  Laceration Location: right forehead  Laceration Length: 1cm  No Foreign Bodies seen or palpated  Anesthesia: none Irrigation method: syringe Amount of cleaning: standard  Skin closure: dermabond  Patient tolerance: Patient tolerated the procedure well with no immediate complications.   Medications Ordered in ED Medications - No data to display   Initial  Impression / Assessment and Plan / ED Course  I have reviewed the triage vital signs and the nursing notes.  Pertinent labs & imaging results that were available during my care of the patient were reviewed by me and considered in my medical decision making (see chart for details).     Patient presenting today after a minor head trauma but on Eliquis.  Neurologic exam is within normal limits.  CT is negative for acute intracranial bleed.  Patient's small laceration on her forehead was repaired with Dermabond as above.  Tetanus shot is up-to-date and patient was discharged home with return precautions.  Final Clinical Impressions(s) / ED Diagnoses   Final diagnoses:  Minor head injury, initial encounter  Forehead contusion, initial encounter    ED Discharge Orders    None  Blanchie Dessert, MD 01/19/18 2221

## 2018-02-11 DIAGNOSIS — R1310 Dysphagia, unspecified: Secondary | ICD-10-CM | POA: Diagnosis not present

## 2018-02-11 DIAGNOSIS — K2210 Ulcer of esophagus without bleeding: Secondary | ICD-10-CM | POA: Diagnosis not present

## 2018-02-11 DIAGNOSIS — K228 Other specified diseases of esophagus: Secondary | ICD-10-CM | POA: Diagnosis not present

## 2018-02-11 DIAGNOSIS — K2930 Chronic superficial gastritis without bleeding: Secondary | ICD-10-CM | POA: Diagnosis not present

## 2018-02-11 DIAGNOSIS — K449 Diaphragmatic hernia without obstruction or gangrene: Secondary | ICD-10-CM | POA: Diagnosis not present

## 2018-02-11 DIAGNOSIS — K209 Esophagitis, unspecified: Secondary | ICD-10-CM | POA: Diagnosis not present

## 2018-02-11 DIAGNOSIS — K317 Polyp of stomach and duodenum: Secondary | ICD-10-CM | POA: Diagnosis not present

## 2018-02-21 DIAGNOSIS — K2210 Ulcer of esophagus without bleeding: Secondary | ICD-10-CM | POA: Diagnosis not present

## 2018-02-21 DIAGNOSIS — J3081 Allergic rhinitis due to animal (cat) (dog) hair and dander: Secondary | ICD-10-CM | POA: Diagnosis not present

## 2018-02-21 DIAGNOSIS — J3089 Other allergic rhinitis: Secondary | ICD-10-CM | POA: Diagnosis not present

## 2018-02-21 DIAGNOSIS — R05 Cough: Secondary | ICD-10-CM | POA: Diagnosis not present

## 2018-02-21 DIAGNOSIS — L501 Idiopathic urticaria: Secondary | ICD-10-CM | POA: Diagnosis not present

## 2018-02-24 DIAGNOSIS — L738 Other specified follicular disorders: Secondary | ICD-10-CM | POA: Diagnosis not present

## 2018-02-24 DIAGNOSIS — D485 Neoplasm of uncertain behavior of skin: Secondary | ICD-10-CM | POA: Diagnosis not present

## 2018-02-24 DIAGNOSIS — G4733 Obstructive sleep apnea (adult) (pediatric): Secondary | ICD-10-CM | POA: Diagnosis not present

## 2018-03-18 DIAGNOSIS — Z01419 Encounter for gynecological examination (general) (routine) without abnormal findings: Secondary | ICD-10-CM | POA: Diagnosis not present

## 2018-03-21 ENCOUNTER — Other Ambulatory Visit: Payer: Self-pay | Admitting: Internal Medicine

## 2018-03-21 DIAGNOSIS — E039 Hypothyroidism, unspecified: Secondary | ICD-10-CM | POA: Diagnosis not present

## 2018-03-21 DIAGNOSIS — Z0000 Encounter for general adult medical examination without abnormal findings: Secondary | ICD-10-CM | POA: Diagnosis not present

## 2018-03-21 DIAGNOSIS — G9050 Complex regional pain syndrome I, unspecified: Secondary | ICD-10-CM | POA: Diagnosis not present

## 2018-03-21 DIAGNOSIS — K2210 Ulcer of esophagus without bleeding: Secondary | ICD-10-CM | POA: Diagnosis not present

## 2018-03-21 DIAGNOSIS — Z131 Encounter for screening for diabetes mellitus: Secondary | ICD-10-CM | POA: Diagnosis not present

## 2018-03-21 DIAGNOSIS — Z1231 Encounter for screening mammogram for malignant neoplasm of breast: Secondary | ICD-10-CM

## 2018-03-21 DIAGNOSIS — Z13220 Encounter for screening for lipoid disorders: Secondary | ICD-10-CM | POA: Diagnosis not present

## 2018-03-21 DIAGNOSIS — M797 Fibromyalgia: Secondary | ICD-10-CM | POA: Diagnosis not present

## 2018-03-21 DIAGNOSIS — Z1389 Encounter for screening for other disorder: Secondary | ICD-10-CM | POA: Diagnosis not present

## 2018-04-20 ENCOUNTER — Telehealth: Payer: Self-pay | Admitting: Hematology

## 2018-04-20 NOTE — Telephone Encounter (Signed)
Pt has been scheduled to see Dr. Lindi Adie on 4/1 at 1pm. Aware to arrive 30 minutes early.

## 2018-05-02 NOTE — Progress Notes (Signed)
[facility] CONSULT NOTE  Patient Care Team: System, Pcp Not In as PCP - General  CHIEF COMPLAINTS/PURPOSE OF CONSULTATION: History of breast cancer and DVT  HISTORY OF PRESENTING ILLNESS:  Sierra Perez 58 y.o. female is here because of a history of right breast cancer in 2014 and multiple left lower extremity DVTs. The cancer was detected on a diagnostic mammogram on 09/06/12 and measured 1cm. A biopsy on 08/30/12 showed the cancer to be IDC with DCIS, ER/PR negative, HER2 positive, Ki67 20-25%. She underwent neoadjuvant chemotherapy followed by lumpectomy on 02/13/13 with all tested lymph nodes negative.   She presents to the clinic alone today. She reports a family history of breast cancer in her paternal grandmother, who died in her 54s from the disease. She has a family history of Factor VIII inhibitor in her father.  She moved from [place] to [place] because of employment.  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  I reviewed her records extensively and collaborated the history with the patient.  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    02/13/2013 Surgery    Right breast lumpectomy: benign parenchyma with some stromal fibrosis consistent with previous tumor site, focal area of atypical lobular hyperplasia, all lymph nodes  negative.      Radiation Therapy    Adjuvant radiation    04/06/2013 Imaging    DEXA: T-score of -1.1, osteopenia     09/25/2016 Initial Biopsy    Columnar cell alteration with atypia, foreign body granuloma     MEDICAL HISTORY:  Past Medical History:  Diagnosis Date  . Breast cancer ([facility]   . DVT (deep venous thrombosis) ([facility]   . Thyroid disease     SURGICAL HISTORY: Past Surgical History:  Procedure Laterality Date  . ABDOMINAL HYSTERECTOMY    . BREAST SURGERY    . KNEE SURGERY      SOCIAL HISTORY: Denies any tobacco alcohol or recreational drug use FAMILY HISTORY: No family history of breast cancer  ALLERGIES:  is allergic to succinylcholine and sulfa antibiotics.  MEDICATIONS:  Current Outpatient Medications  Medication Sig Dispense Refill  . apixaban (ELIQUIS) 5 MG TABS tablet Take 1 tablet (5 mg total) by mouth 2 (two) times daily. 180 tablet 3   No current facility-administered medications for this visit.     REVIEW OF SYSTEMS:   Constitutional: Denies fevers, chills or abnormal night sweats Eyes: Denies blurriness of vision, double vision or watery eyes Ears, nose, mouth, throat, and face: Denies mucositis or sore throat Respiratory: Denies cough, dyspnea or wheezes Cardiovascular: Denies palpitation, chest discomfort or lower extremity swelling Gastrointestinal:  Denies nausea, heartburn or change in bowel habits Skin: Denies abnormal skin rashes Lymphatics: Denies new lymphadenopathy or easy bruising Neurological:Denies numbness, tingling or new weaknesses Behavioral/Psych: Mood is stable, no new changes  Breast: Denies any palpable lumps  or discharge All other systems were reviewed with the patient and are negative.  PHYSICAL EXAMINATION: ECOG PERFORMANCE STATUS: 0 - Asymptomatic  Vitals:   05/04/18 1256  BP: 128/63  Pulse: 84  Resp: 18  Temp: 97.8 F (36.6 C)  SpO2: 99%   Filed Weights   05/04/18 1256  Weight: 226 lb 14.4 oz (102.9 kg)     GENERAL:alert, no distress and comfortable SKIN: skin color, texture, turgor are normal, no rashes or significant lesions EYES: normal, conjunctiva are pink and non-injected, sclera clear OROPHARYNX:no exudate, no erythema and lips, buccal mucosa, and tongue normal  NECK: supple, thyroid normal size, non-tender, without nodularity LYMPH:  no palpable lymphadenopathy in the cervical, axillary or inguinal LUNGS: clear to auscultation and percussion with normal breathing effort HEART: regular rate & rhythm and no murmurs and no lower extremity edema ABDOMEN:abdomen soft, non-tender and normal bowel sounds Musculoskeletal:no cyanosis of digits and no clubbing  PSYCH: alert & oriented x 3 with fluent speech NEURO: no focal motor/sensory deficits  RADIOGRAPHIC STUDIES: I have personally reviewed the radiological reports and agreed with the findings in the report.  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  Adjuvant radiation therapy  Current treatment: Surveillance with annual mammograms and breast MRIs. Patient will get a mammogram in April and a breast MRI in November.  Survivorship: We discussed different aspects of survivorship including maintaining appropriate weight.  I discussed with her about intermittent fasting and provided her with literature on that. We also discussed the importance of exercise. 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  She has been getting CA-27-29 at her prior oncologist.  I discussed with her that there is  no data to support continued testing for this.  However she was insistent on obtaining it for now.  So we will obtain for today but I do not recommend repeating this in the future.  She will do more research on this lab entity.  Patient wishes to come every 6 months for evaluations.  We will see her back in 6 months with follow-up.  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 I sent a prescription for Eliquis.   All questions were answered. The patient knows to call the clinic with any problems, questions or concerns.   Rulon Eisenmenger, MD 05/04/2018   I, Molly Dorshimer, am acting as scribe for Nicholas Lose, MD.  I have reviewed the above documentation for accuracy and completeness, and I agree with the above.

## 2018-05-03 DIAGNOSIS — Z171 Estrogen receptor negative status [ER-]: Secondary | ICD-10-CM | POA: Insufficient documentation

## 2018-05-03 DIAGNOSIS — C50411 Malignant neoplasm of upper-outer quadrant of right female breast: Secondary | ICD-10-CM | POA: Insufficient documentation

## 2018-05-04 ENCOUNTER — Other Ambulatory Visit: Payer: Self-pay

## 2018-05-04 ENCOUNTER — Inpatient Hospital Stay: Payer: BLUE CROSS/BLUE SHIELD

## 2018-05-04 ENCOUNTER — Inpatient Hospital Stay: Payer: BLUE CROSS/BLUE SHIELD | Attending: Hematology and Oncology | Admitting: Hematology and Oncology

## 2018-05-04 DIAGNOSIS — I82599 Chronic embolism and thrombosis of other specified deep vein of unspecified lower extremity: Secondary | ICD-10-CM

## 2018-05-04 DIAGNOSIS — E079 Disorder of thyroid, unspecified: Secondary | ICD-10-CM | POA: Diagnosis not present

## 2018-05-04 DIAGNOSIS — Z86718 Personal history of other venous thrombosis and embolism: Secondary | ICD-10-CM | POA: Insufficient documentation

## 2018-05-04 DIAGNOSIS — Z7901 Long term (current) use of anticoagulants: Secondary | ICD-10-CM | POA: Diagnosis not present

## 2018-05-04 DIAGNOSIS — Z9221 Personal history of antineoplastic chemotherapy: Secondary | ICD-10-CM | POA: Diagnosis not present

## 2018-05-04 DIAGNOSIS — Z171 Estrogen receptor negative status [ER-]: Secondary | ICD-10-CM

## 2018-05-04 DIAGNOSIS — Z923 Personal history of irradiation: Secondary | ICD-10-CM | POA: Diagnosis not present

## 2018-05-04 DIAGNOSIS — Z803 Family history of malignant neoplasm of breast: Secondary | ICD-10-CM

## 2018-05-04 DIAGNOSIS — Z853 Personal history of malignant neoplasm of breast: Secondary | ICD-10-CM

## 2018-05-04 DIAGNOSIS — I82409 Acute embolism and thrombosis of unspecified deep veins of unspecified lower extremity: Secondary | ICD-10-CM | POA: Insufficient documentation

## 2018-05-04 DIAGNOSIS — Z1231 Encounter for screening mammogram for malignant neoplasm of breast: Secondary | ICD-10-CM

## 2018-05-04 DIAGNOSIS — C50411 Malignant neoplasm of upper-outer quadrant of right female breast: Secondary | ICD-10-CM

## 2018-05-04 DIAGNOSIS — Z832 Family history of diseases of the blood and blood-forming organs and certain disorders involving the immune mechanism: Secondary | ICD-10-CM | POA: Insufficient documentation

## 2018-05-04 MED ORDER — APIXABAN 5 MG PO TABS: 5.0000 mg | ORAL_TABLET | Freq: Two times a day (BID) | ORAL | 3 refills | Status: DC

## 2018-05-04 NOTE — Assessment & Plan Note (Addendum)
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  Adjuvant radiation therapy  Current treatment: Surveillance with annual mammograms and breast MRIs. Patient will get a mammogram in April and a breast MRI in November.  Survivorship: We discussed different aspects of survivorship including maintaining appropriate weight.  I discussed with her about intermittent fasting and provided her with literature on that. We also discussed the importance of exercise. 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  She has been getting CA-27-29 at her prior oncologist.  I discussed with her that there is no data to support continued testing for this.  However she was insistent on obtaining it for now.  So we will obtain for today but I do not recommend repeating this in the future.  She will do more research on this lab entity.  Patient wishes to come every 6 months for evaluations.  We will see her back in 6 months with follow-up.

## 2018-05-04 NOTE — Assessment & Plan Note (Signed)
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 I sent a prescription for Eliquis.

## 2018-05-05 ENCOUNTER — Telehealth: Payer: Self-pay | Admitting: Hematology and Oncology

## 2018-05-05 LAB — CANCER ANTIGEN 27.29: CA 27.29: 32 U/mL (ref 0.0–38.6)

## 2018-05-05 NOTE — Telephone Encounter (Signed)
I left a message for the patient that the CA-27-29 is normal

## 2018-05-05 NOTE — Telephone Encounter (Signed)
Called regarding 10/7

## 2018-07-01 DIAGNOSIS — K222 Esophageal obstruction: Secondary | ICD-10-CM | POA: Diagnosis not present

## 2018-07-01 DIAGNOSIS — K219 Gastro-esophageal reflux disease without esophagitis: Secondary | ICD-10-CM | POA: Diagnosis not present

## 2018-07-01 DIAGNOSIS — K449 Diaphragmatic hernia without obstruction or gangrene: Secondary | ICD-10-CM | POA: Diagnosis not present

## 2018-07-08 DIAGNOSIS — D1730 Benign lipomatous neoplasm of skin and subcutaneous tissue of unspecified sites: Secondary | ICD-10-CM | POA: Diagnosis not present

## 2018-07-08 DIAGNOSIS — L738 Other specified follicular disorders: Secondary | ICD-10-CM | POA: Diagnosis not present

## 2018-07-08 DIAGNOSIS — D239 Other benign neoplasm of skin, unspecified: Secondary | ICD-10-CM | POA: Diagnosis not present

## 2018-07-08 DIAGNOSIS — M721 Knuckle pads: Secondary | ICD-10-CM | POA: Diagnosis not present

## 2018-07-13 DIAGNOSIS — K219 Gastro-esophageal reflux disease without esophagitis: Secondary | ICD-10-CM | POA: Diagnosis not present

## 2018-07-18 DIAGNOSIS — Z23 Encounter for immunization: Secondary | ICD-10-CM | POA: Diagnosis not present

## 2018-08-17 DIAGNOSIS — G4733 Obstructive sleep apnea (adult) (pediatric): Secondary | ICD-10-CM | POA: Diagnosis not present

## 2018-08-29 DIAGNOSIS — G4733 Obstructive sleep apnea (adult) (pediatric): Secondary | ICD-10-CM | POA: Diagnosis not present

## 2018-09-21 DIAGNOSIS — Z853 Personal history of malignant neoplasm of breast: Secondary | ICD-10-CM | POA: Diagnosis not present

## 2018-09-21 DIAGNOSIS — M797 Fibromyalgia: Secondary | ICD-10-CM | POA: Diagnosis not present

## 2018-09-21 DIAGNOSIS — E039 Hypothyroidism, unspecified: Secondary | ICD-10-CM | POA: Diagnosis not present

## 2018-09-21 DIAGNOSIS — K219 Gastro-esophageal reflux disease without esophagitis: Secondary | ICD-10-CM | POA: Diagnosis not present

## 2018-10-06 DIAGNOSIS — N644 Mastodynia: Secondary | ICD-10-CM | POA: Diagnosis not present

## 2018-10-06 DIAGNOSIS — Z853 Personal history of malignant neoplasm of breast: Secondary | ICD-10-CM | POA: Diagnosis not present

## 2018-10-12 ENCOUNTER — Other Ambulatory Visit: Payer: Self-pay | Admitting: Radiology

## 2018-10-12 DIAGNOSIS — Z853 Personal history of malignant neoplasm of breast: Secondary | ICD-10-CM

## 2018-10-24 DIAGNOSIS — M25512 Pain in left shoulder: Secondary | ICD-10-CM | POA: Diagnosis not present

## 2018-11-07 ENCOUNTER — Ambulatory Visit: Payer: BLUE CROSS/BLUE SHIELD | Admitting: Hematology and Oncology

## 2018-11-08 NOTE — Progress Notes (Signed)
 Patient Care Team: System, Pcp Not In as PCP - General  DIAGNOSIS:    ICD-10-CM   1. 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   02/13/2013 Surgery   Right breast lumpectomy: benign parenchyma with some stromal fibrosis consistent with previous tumor site, focal area of atypical lobular hyperplasia, all lymph nodes negative.     Radiation Therapy   Adjuvant radiation   04/06/2013 Imaging   DEXA: T-score of -1.1, osteopenia    09/25/2016 Initial Biopsy   Columnar cell alteration with atypia, foreign body granuloma     CHIEF COMPLIANT: Surveillance of right breast cancer and history of DVTs  INTERVAL HISTORY: Sierra Perez is a 58 y.o. with above-mentioned history of multiple left lower extremity DVTs and is currently on treatment with Eliquis. She also has a history of right breast cancer treated with neoadjuvant chemotherapy, lumpectomy, and who is currently on surveillance. She presents to the clinic today for follow-up.  Her biggest issue today is the weight gain during COVID.  She has been eating excessively and is very concerned about her weight.  She just joined a gym and plans to lose it.  REVIEW OF SYSTEMS:   Constitutional:  Denies fevers, chills or abnormal weight loss Eyes: Denies blurriness of vision Ears, nose, mouth, throat, and face: Denies mucositis or sore throat Respiratory: Denies cough, dyspnea or wheezes Cardiovascular: Denies palpitation, chest discomfort Gastrointestinal: Denies nausea, heartburn or change in bowel habits Skin: Denies abnormal skin rashes Lymphatics: Denies new lymphadenopathy or easy bruising Neurological: Denies numbness, tingling or new weaknesses Behavioral/Psych: Mood is stable, no new changes  Extremities: No lower extremity edema Breast: denies any pain or lumps or nodules in either breasts All other systems were reviewed with the patient and are negative.  I have reviewed the past medical history, past surgical history, social history and family history with the patient and they are unchanged from previous note.  ALLERGIES:  is allergic to succinylcholine and sulfa antibiotics.  MEDICATIONS:  Current Outpatient Medications  Medication Sig Dispense Refill  . apixaban (ELIQUIS) 5 MG TABS tablet Take 1 tablet (5 mg total) by mouth 2 (two) times daily. 180 tablet 3  . DULoxetine (CYMBALTA) 30 MG capsule Take 3 capsules (90 mg total) by mouth daily.    . fluticasone (FLONASE) 50 MCG/ACT nasal spray Place 1 spray into both nostrils daily.  2  . gabapentin (NEURONTIN) 300 MG capsule Take 2 capsules (600 mg total) by mouth 2 (two) times daily.    . ipratropium (ATROVENT HFA) 17 MCG/ACT inhaler Inhale 2 puffs into the lungs every 6 (six) hours as needed for wheezing. 1 Inhaler 12  . levothyroxine (SYNTHROID) 75 MCG tablet Take 1 tablet (75 mcg total) by mouth daily before breakfast.    . montelukast (SINGULAIR)   4 MG chewable tablet Chew 1 tablet (4 mg total) by mouth at bedtime.    . pantoprazole (PROTONIX) 40 MG tablet Take 1 tablet (40 mg total) by mouth daily.     No current facility-administered medications for this visit.     PHYSICAL EXAMINATION: ECOG PERFORMANCE  STATUS: 1 - Symptomatic but completely ambulatory  Vitals:   11/09/18 0833  BP: 120/63  Pulse: 73  Resp: 17  Temp: 98 F (36.7 C)  SpO2: 99%   Filed Weights   11/09/18 0833  Weight: 239 lb 8 oz (108.6 kg)    GENERAL: alert, no distress and comfortable SKIN: skin color, texture, turgor are normal, no rashes or significant lesions EYES: normal, Conjunctiva are pink and non-injected, sclera clear OROPHARYNX: no exudate, no erythema and lips, buccal mucosa, and tongue normal  NECK: supple, thyroid normal size, non-tender, without nodularity LYMPH: no palpable lymphadenopathy in the cervical, axillary or inguinal LUNGS: clear to auscultation and percussion with normal breathing effort HEART: regular rate & rhythm and no murmurs and no lower extremity edema ABDOMEN: abdomen soft, non-tender and normal bowel sounds MUSCULOSKELETAL: no cyanosis of digits and no clubbing  NEURO: alert & oriented x 3 with fluent speech, no focal motor/sensory deficits EXTREMITIES: No lower extremity edema BREAST: No palpable masses or nodules in either right or left breasts. No palpable axillary supraclavicular or infraclavicular adenopathy no breast tenderness or nipple discharge. (exam performed in the presence of a chaperone)  LABORATORY DATA:  I have reviewed the data as listed No flowsheet data found.  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 I sent a prescription for Eliquis  Weight gain issues: I discussed with her about intermittent fasting and exercising and avoiding sugar.  Patient wishes to come every 6 months for evaluations.  We will see her back in 6 months with follow-up.   No orders of the defined types were placed in this encounter.  The patient has a good understanding of the overall plan. she agrees with it. she will call with any problems that may develop before the next visit here.  Nicholas Lose, MD 11/09/2018  Julious Oka Dorshimer am acting as scribe for Dr. Nicholas Lose.  I have reviewed the above documentation for accuracy and completeness, and I agree with the above.

## 2018-11-09 ENCOUNTER — Other Ambulatory Visit: Payer: Self-pay

## 2018-11-09 ENCOUNTER — Inpatient Hospital Stay: Payer: BC Managed Care – PPO | Attending: Hematology and Oncology | Admitting: Hematology and Oncology

## 2018-11-09 DIAGNOSIS — Z79899 Other long term (current) drug therapy: Secondary | ICD-10-CM | POA: Diagnosis not present

## 2018-11-09 DIAGNOSIS — Z7901 Long term (current) use of anticoagulants: Secondary | ICD-10-CM | POA: Diagnosis not present

## 2018-11-09 DIAGNOSIS — R635 Abnormal weight gain: Secondary | ICD-10-CM | POA: Insufficient documentation

## 2018-11-09 DIAGNOSIS — Z9221 Personal history of antineoplastic chemotherapy: Secondary | ICD-10-CM | POA: Insufficient documentation

## 2018-11-09 DIAGNOSIS — Z923 Personal history of irradiation: Secondary | ICD-10-CM | POA: Insufficient documentation

## 2018-11-09 DIAGNOSIS — C50411 Malignant neoplasm of upper-outer quadrant of right female breast: Secondary | ICD-10-CM

## 2018-11-09 DIAGNOSIS — Z86718 Personal history of other venous thrombosis and embolism: Secondary | ICD-10-CM | POA: Diagnosis not present

## 2018-11-09 DIAGNOSIS — Z171 Estrogen receptor negative status [ER-]: Secondary | ICD-10-CM

## 2018-11-09 MED ORDER — LEVOTHYROXINE SODIUM 75 MCG PO TABS: 75.0000 ug | ORAL_TABLET | Freq: Every day | ORAL | Status: DC

## 2018-11-09 MED ORDER — DULOXETINE HCL 30 MG PO CPEP: 90.0000 mg | ORAL_CAPSULE | Freq: Every day | ORAL | Status: DC

## 2018-11-09 MED ORDER — GABAPENTIN 300 MG PO CAPS: 600.0000 mg | ORAL_CAPSULE | Freq: Two times a day (BID) | ORAL | Status: DC

## 2018-11-09 MED ORDER — IPRATROPIUM BROMIDE HFA 17 MCG/ACT IN AERS: 2.0000 | INHALATION_SPRAY | Freq: Four times a day (QID) | RESPIRATORY_TRACT | 12 refills | Status: DC | PRN

## 2018-11-09 MED ORDER — FLUTICASONE PROPIONATE 50 MCG/ACT NA SUSP: 1.0000 | Freq: Every day | NASAL | 2 refills | Status: DC

## 2018-11-09 MED ORDER — PANTOPRAZOLE SODIUM 40 MG PO TBEC: 40.0000 mg | DELAYED_RELEASE_TABLET | Freq: Every day | ORAL | Status: DC

## 2018-11-09 MED ORDER — MONTELUKAST SODIUM 4 MG PO CHEW: 4.0000 mg | CHEWABLE_TABLET | Freq: Every day | ORAL | Status: DC

## 2018-11-09 NOTE — Assessment & Plan Note (Signed)
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 I sent a prescription for Eliquis  Patient wishes to come every 6 months for evaluations.  We will see her back in 6 months with follow-up.

## 2018-11-10 ENCOUNTER — Telehealth: Payer: Self-pay | Admitting: Hematology and Oncology

## 2018-11-10 NOTE — Telephone Encounter (Signed)
Scheduled apt per 10/7 los - spoke with pt and she is aware of appt date and time . Reminder letter mailed.

## 2018-11-22 DIAGNOSIS — G4733 Obstructive sleep apnea (adult) (pediatric): Secondary | ICD-10-CM | POA: Diagnosis not present

## 2018-11-23 DIAGNOSIS — M25512 Pain in left shoulder: Secondary | ICD-10-CM | POA: Diagnosis not present

## 2018-11-23 DIAGNOSIS — M7711 Lateral epicondylitis, right elbow: Secondary | ICD-10-CM | POA: Diagnosis not present

## 2019-01-06 DIAGNOSIS — M7711 Lateral epicondylitis, right elbow: Secondary | ICD-10-CM | POA: Diagnosis not present

## 2019-01-18 DIAGNOSIS — M7711 Lateral epicondylitis, right elbow: Secondary | ICD-10-CM | POA: Diagnosis not present

## 2019-01-25 ENCOUNTER — Other Ambulatory Visit: Payer: Self-pay | Admitting: Gastroenterology

## 2019-01-25 ENCOUNTER — Ambulatory Visit
Admission: RE | Admit: 2019-01-25 | Discharge: 2019-01-25 | Disposition: A | Discharge status: Home / Self Care | Payer: BC Managed Care – PPO | Source: Ambulatory Visit | Attending: Gastroenterology | Admitting: Gastroenterology

## 2019-01-25 DIAGNOSIS — R1310 Dysphagia, unspecified: Secondary | ICD-10-CM | POA: Diagnosis not present

## 2019-01-25 DIAGNOSIS — K589 Irritable bowel syndrome without diarrhea: Secondary | ICD-10-CM | POA: Diagnosis not present

## 2019-01-25 DIAGNOSIS — K581 Irritable bowel syndrome with constipation: Secondary | ICD-10-CM | POA: Diagnosis not present

## 2019-01-25 IMAGING — CR DG ABDOMEN 2V
3 series · 3 of 3 positions shown · non-contrast
Comparison: None

CLINICAL DATA: Irritable bowel

EXAM:
ABDOMEN - 2 VIEW

[w abdomen upright * (1 of 2)]
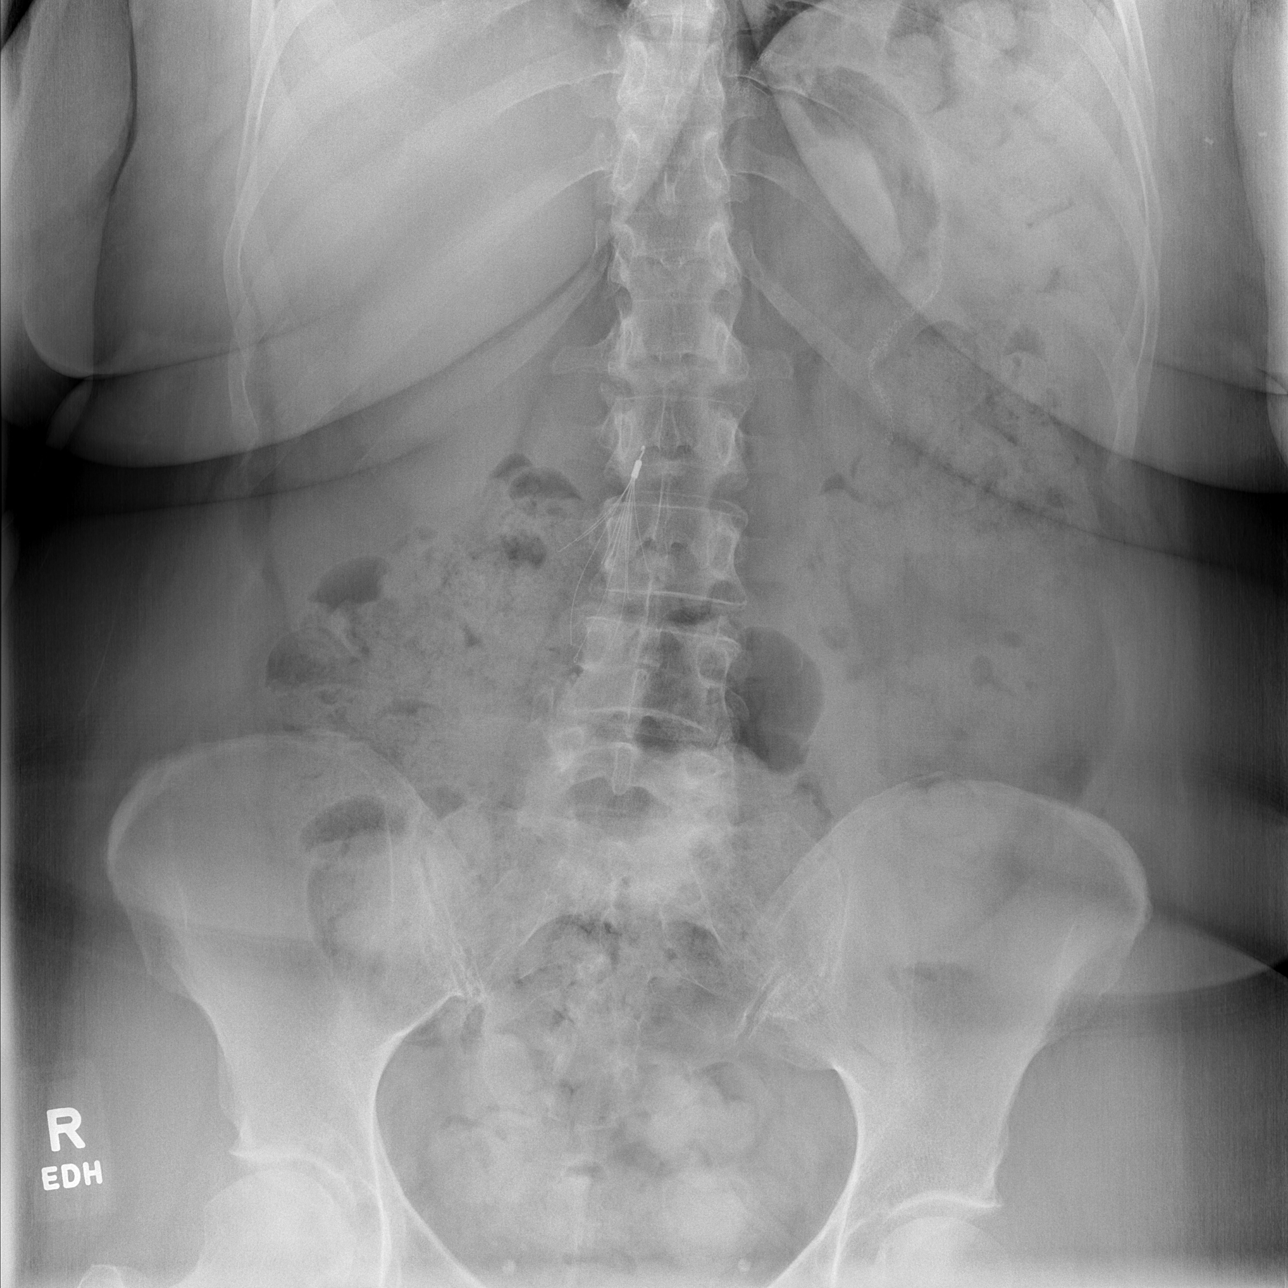

[w abdomen upright * (2 of 2)]
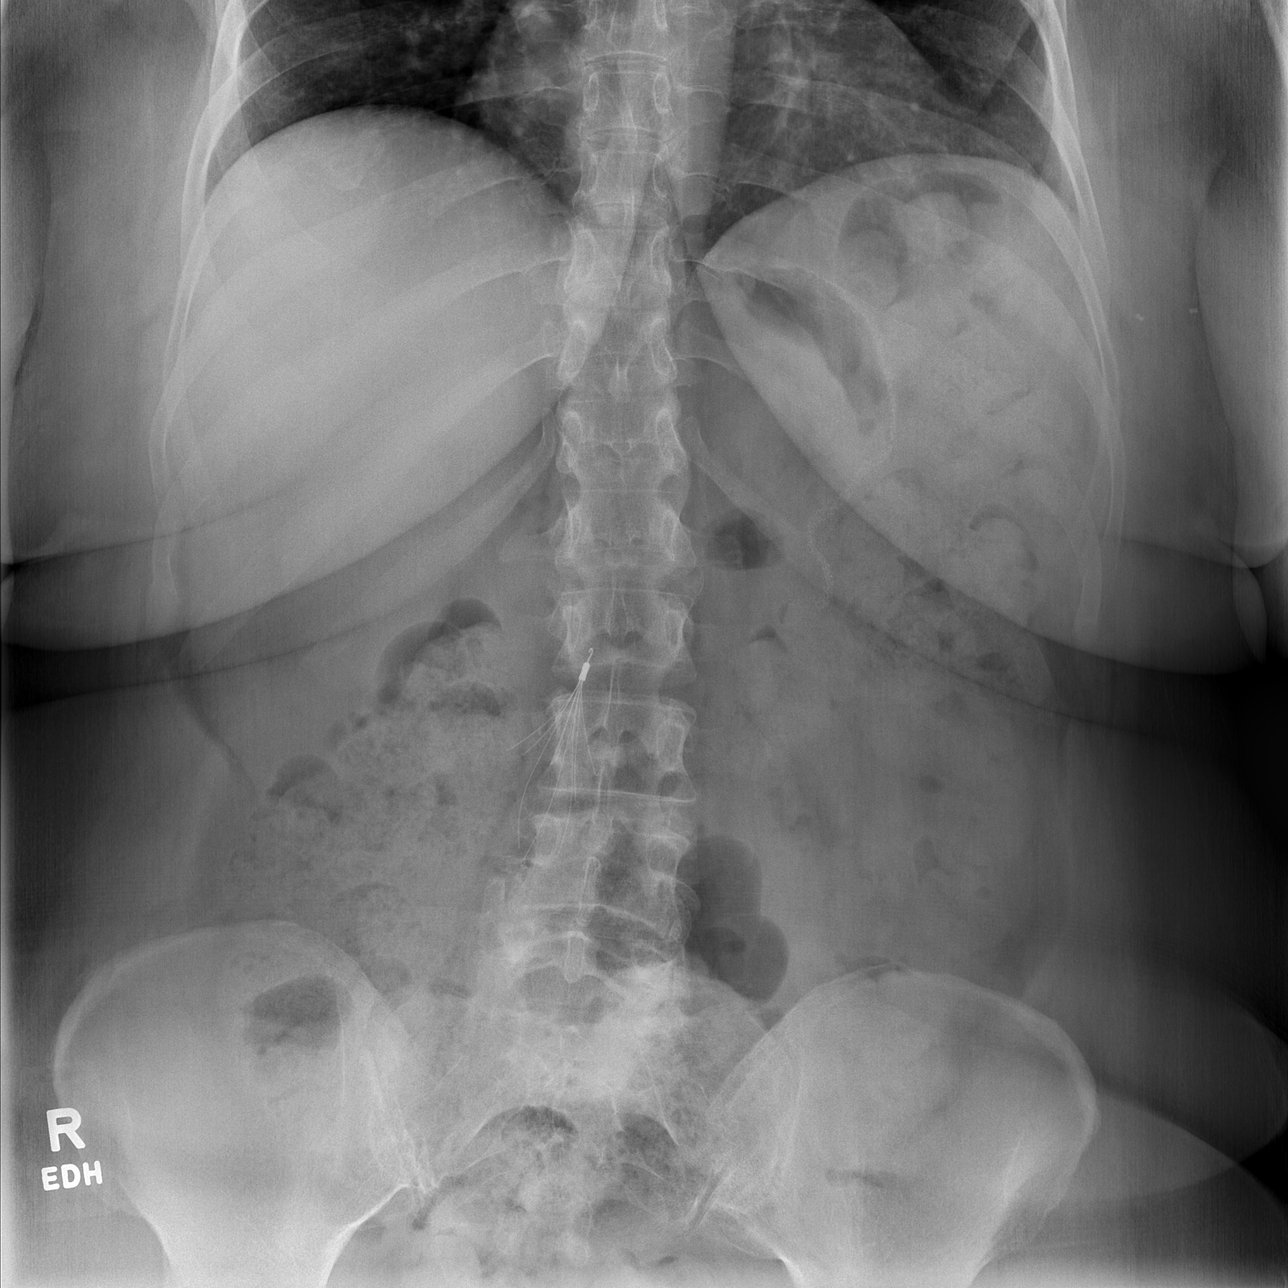

[t abdomen supine]
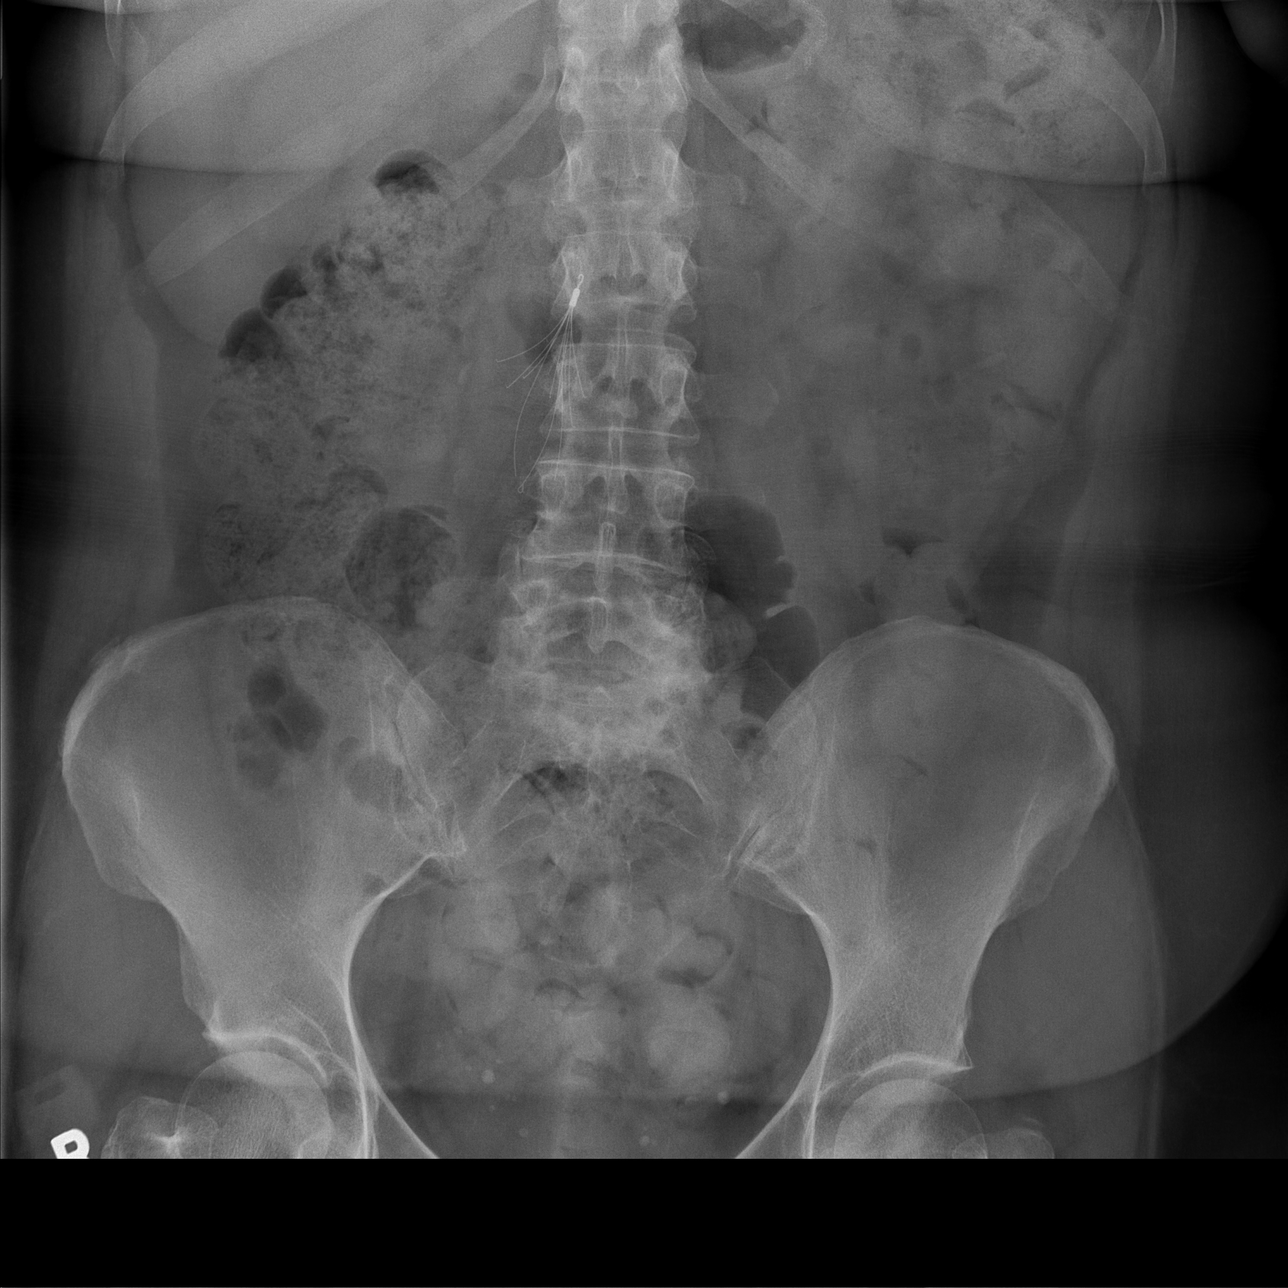

[3 of 3 positions shown; findings below may reference images not displayed]

FINDINGS: Moderate stool burden throughout the colon. There is normal bowel
gas pattern. No free air. No organomegaly or suspicious
calcification. No acute bony abnormality. IVC filter in place.
IMPRESSION: Moderate stool burden.  No acute findings.

## 2019-02-16 ENCOUNTER — Other Ambulatory Visit: Payer: Self-pay

## 2019-02-20 ENCOUNTER — Encounter: Payer: Self-pay | Admitting: Obstetrics & Gynecology

## 2019-02-20 ENCOUNTER — Other Ambulatory Visit: Payer: Self-pay

## 2019-02-20 ENCOUNTER — Other Ambulatory Visit (HOSPITAL_COMMUNITY)
Admission: RE | Admit: 2019-02-20 | Discharge: 2019-02-20 | Disposition: A | Discharge status: Home / Self Care | Payer: Managed Care, Other (non HMO) | Source: Ambulatory Visit | Attending: Obstetrics & Gynecology | Admitting: Obstetrics & Gynecology

## 2019-02-20 ENCOUNTER — Ambulatory Visit: Payer: Managed Care, Other (non HMO) | Admitting: Obstetrics & Gynecology

## 2019-02-20 VITALS — BP 138/76 | HR 84 | Temp 97.6°F | Resp 12 | Ht 67.75 in | Wt 230.0 lb

## 2019-02-20 DIAGNOSIS — Z124 Encounter for screening for malignant neoplasm of cervix: Secondary | ICD-10-CM | POA: Diagnosis present

## 2019-02-20 DIAGNOSIS — B977 Papillomavirus as the cause of diseases classified elsewhere: Secondary | ICD-10-CM | POA: Diagnosis present

## 2019-02-20 DIAGNOSIS — N898 Other specified noninflammatory disorders of vagina: Secondary | ICD-10-CM | POA: Diagnosis not present

## 2019-02-20 DIAGNOSIS — N939 Abnormal uterine and vaginal bleeding, unspecified: Secondary | ICD-10-CM

## 2019-02-20 DIAGNOSIS — Z01419 Encounter for gynecological examination (general) (routine) without abnormal findings: Secondary | ICD-10-CM | POA: Diagnosis not present

## 2019-02-20 DIAGNOSIS — B009 Herpesviral infection, unspecified: Secondary | ICD-10-CM

## 2019-02-20 DIAGNOSIS — K589 Irritable bowel syndrome without diarrhea: Secondary | ICD-10-CM | POA: Insufficient documentation

## 2019-02-20 DIAGNOSIS — N952 Postmenopausal atrophic vaginitis: Secondary | ICD-10-CM

## 2019-02-20 MED ORDER — VALACYCLOVIR HCL 500 MG PO TABS: 500.0000 mg | ORAL_TABLET | Freq: Every day | ORAL | 12 refills | Status: DC

## 2019-02-20 NOTE — Progress Notes (Signed)
59 y.o. [id number] Single White or Caucasian female here as a new patient for an annual exam. Patient moved here a year ago from [place]. This was work related.  From [place] originally.    Has hx of abnormal pap smear with +HR HPV.  Had a colposcopy about 30 years.  Never had treatment to her cervix.    Per patient, would like to discuss pap smears and bleeding in the past after intercourse.  Patient has had a hysterectomy, TAH, due to fibroids and then went back and had oophorectomy about two years later after breast cancer diagnosis.    Followed by Dr. Lindi Adie.  Having every six months MMGs and MRIs.  H/o HSV 1.  Concerns about what to use/take if has new partner.  Has shared with partners in the past.  Has experienced some vaginal bleeding with intercourse PCP:  Appt is scheduled in a month.    No LMP recorded. Patient has had a hysterectomy.          Sexually active: No.  The current method of family planning is status post hysterectomy.    Exercising: Yes.    weights, cardio, and walking Smoker:  no  Health Maintenance: Pap:  About 2 years per patient in [place] History of abnormal Pap:  Yes, patient has had a colpo done 30 years ago MMG:  10/06/18 Bilateral Diagnostic MM - patient has an MRI scheduled for March 2021 Colonoscopy:  2017 hx of polyps and family hx of colon cancer -- f/u 5 years BMD:   2016 Osteopenia TDaP:  2019 Pneumonia vaccine(s):  n/a Shingrix:   completed Hep C testing: Negative in the past Screening Labs: discuss today -- patient sees Dr. Lysle Rubens   reports that she has never smoked. She has never used smokeless tobacco. She reports current alcohol use. She reports that she does not use drugs.  Past Medical History:  Diagnosis Date  . Anxiety   . Breast cancer ([place]) 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, her-2 positive, ER/PR negative  . Clotting disorder ([place])    testing was negative, h/o DVT while on treatment  . Complex regional pain syndrome i of right lower limb   . DVT  (deep venous thrombosis) ([place])    started in 2015, multiple  . Fibroid   . Heart murmur   . HPV in female   . IBS (irritable bowel syndrome)   . STD (sexually transmitted disease)    HSV  . Thyroid disease     Past Surgical History:  Procedure Laterality Date  . ABDOMINAL HYSTERECTOMY  2010   fibroids, h/o abnormal pap smears  . BREAST SURGERY Right 2015   right lumpectomy with reduction and lift  . DENTAL SURGERY    . FOOT SURGERY     dislocated toe  . [place] RESECTION  2014  . LAPAROSCOPIC OOPHERECTOMY Bilateral 2016  . REPLACEMENT TOTAL KNEE Left 2018   also had 3 prior arthroscopies  . VENA CAVA FILTER PLACEMENT  2017    Current Outpatient Medications  Medication Sig Dispense Refill  . albuterol (VENTOLIN HFA) 108 (90 Base) MCG/ACT inhaler SMARTSIG:1-2 Puff(s) By Mouth Every 4-6 Hours PRN    . AMITIZA 8 MCG capsule Take 8 mcg by mouth 2 (two) times daily.    [name] apixaban (ELIQUIS) 5 MG TABS tablet Take 1 tablet (5 mg total) by mouth 2 (two) times daily. 180 tablet 3  . B Complex Vitamins (B COMPLEX 1 PO) vitamin B complex capsule    .  Bacillus Coagulans-Inulin (PROBIOTIC) 1-250 BILLION-MG CAPS Probiotic    . Cholecalciferol (VITAMIN D-3 PO) Vitamin D3    . DULoxetine (CYMBALTA) 30 MG capsule Take 3 capsules (90 mg total) by mouth daily.    . fluticasone (FLONASE) 50 MCG/ACT nasal spray Place 1 spray into both nostrils daily.  2  . gabapentin (NEURONTIN) 300 MG capsule Take 2 capsules (600 mg total) by mouth 2 (two) times daily.    [name] ipratropium (ATROVENT HFA) 17 MCG/ACT inhaler Inhale 2 puffs into the lungs every 6 (six) hours as needed for wheezing. 1 Inhaler 12  . levothyroxine (SYNTHROID) 75 MCG tablet Take 1 tablet (75 mcg total) by mouth daily before breakfast.    . pantoprazole (PROTONIX) 40 MG tablet Take 1 tablet (40 mg total) by mouth daily.     No current facility-administered medications for this visit.    Family History  Problem  Relation Age of Onset  . Hypertension Mother   . Diabetes Father   . Kidney disease Father   . Dementia Father   . Factor VIII deficiency Father   . Bladder Cancer Maternal Grandmother   . Stroke Maternal Grandmother   . Lung cancer Maternal Grandfather   . Colon cancer Maternal Grandfather   . Cancer Paternal Grandmother        "female"    Review of Systems  All other systems reviewed and are negative.   Exam:   BP 138/76 (BP Location: Left Arm, Patient Position: Sitting, Cuff Size: Normal)   Pulse 84   Temp 97.6 F (36.4 C) (Temporal)   Resp 12   Ht 5' 7.75" (1.721 m)   Wt 230 lb (104.3 kg)   BMI 35.23 kg/m    Height: 5' 7.75" (172.1 cm)  Ht Readings from Last 3 Encounters:  02/20/19 5' 7.75" (1.721 m)  11/09/18 5' 8"  (1.727 m)  05/04/18 5' 8"  (1.727 m)    General appearance: alert, cooperative and appears stated age Head: Normocephalic, without obvious abnormality, atraumatic Neck: no adenopathy, supple, symmetrical, trachea midline and thyroid normal to inspection and palpation Lungs: clear to auscultation bilaterally Breasts: normal appearance, no masses or tenderness Heart: regular rate and rhythm Abdomen: soft, non-tender; bowel sounds normal; no masses,  no organomegaly Extremities: extremities normal, atraumatic, no cyanosis or edema Skin: Skin color, texture, turgor normal. No rashes or lesions Lymph nodes: Cervical, supraclavicular, and axillary nodes normal. No abnormal inguinal nodes palpated Neurologic: Grossly normal   Pelvic: External genitalia:  no lesions              Urethra:  normal appearing urethra with no masses, tenderness or lesions              Bartholins and Skenes: normal                 Vagina: normal appearing vagina with normal color and discharge, no lesions, atrophic changes at the vaginal apex noted and bleeding occurred with pap smear              Cervix: absent              Pap taken: Yes.   Bimanual Exam:  Uterus:  uterus  absent              Adnexa: no mass, fullness, tenderness               Rectovaginal: Confirms               Anus:  normal  sphincter tone, no lesions  Chaperone, Terence Lux, CMA, was present for exam.  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  No rx sent to pharmacy today. return annually or prn  In addition to routine gyn exam and care today, additional concerns of prior HR HPV as well as 2020 guidelines, vaginal odor, HSV 1, vaginal bleeding all addressed.  Additional 25 minutes spent on these concerns with >50% of this time in face to face discussion.

## 2019-02-20 NOTE — Patient Instructions (Addendum)
[facility] Address: [address], [place], [place] [address] Phone: [phone]  Dr. Toney Rakes, [facility] Bariatric surgery  Dr. Briscoe Deutsc[facility]  Plan bone density with your next [facility] (September)

## 2019-02-22 LAB — VAGINITIS/VAGINOSIS, DNA PROBE
Candida Species: NEGATIVE
Gardnerella vaginalis: POSITIVE — AB
Trichomonas vaginosis: NEGATIVE

## 2019-02-22 LAB — CYTOLOGY - PAP
Comment: NEGATIVE
Diagnosis: NEGATIVE
High risk HPV: NEGATIVE

## 2019-02-23 ENCOUNTER — Other Ambulatory Visit: Payer: Self-pay

## 2019-02-23 ENCOUNTER — Other Ambulatory Visit: Payer: Self-pay | Admitting: *Deleted

## 2019-02-23 MED ORDER — METRONIDAZOLE 0.75 % VA GEL: 1.0000 | Freq: Every day | VAGINAL | 0 refills | Status: AC

## 2019-02-23 MED ORDER — APIXABAN 5 MG PO TABS: 5.0000 mg | ORAL_TABLET | Freq: Two times a day (BID) | ORAL | 3 refills | Status: DC

## 2019-03-03 ENCOUNTER — Telehealth: Payer: Self-pay | Admitting: Obstetrics & Gynecology

## 2019-03-03 MED ORDER — NONFORMULARY OR COMPOUNDED ITEM: 3 refills | Status: DC

## 2019-03-03 NOTE — Telephone Encounter (Signed)
Per review of 02/20/19 OV notes will use Vit E vaginal cream or suppositories, no Rx sent.   Per review of 02/20/19 result notes, positive for BV, to treat with metrogel and then start Vit E. No Rx sent.   Spoke with patient. Patient is requesting RX to [facility], she does not have a preference, would prefer the most affordable option. Patient also wants Dr. Sabra Heck to know she does plan to become SA soon and wants to get started with the Vit E. Advised patient will review Rx with Dr. Sabra Heck and our office will notify once Rx has been sent. Advised it does typically take at least 48 hrs for pharmacy to prepare Rx once received, they will notify you once it is ready. Patient thankful for call.   Dr. Sabra Heck -please advise on Vit E Rx.

## 2019-03-03 NOTE — Telephone Encounter (Signed)
Rx printed and signed.  Will bring to you to fax to [facility].

## 2019-03-03 NOTE — Telephone Encounter (Signed)
Prescription faxed to pharmacy and patient has been notified.

## 2019-03-03 NOTE — Telephone Encounter (Signed)
[facility] and they did not receive a prescription request from our office. She said "I thought this supposed to sent in a few weeks ago after I finished the other medication".

## 2019-04-03 ENCOUNTER — Other Ambulatory Visit: Payer: Self-pay | Admitting: Radiology

## 2019-04-04 ENCOUNTER — Encounter (HOSPITAL_COMMUNITY): Payer: Self-pay

## 2019-04-04 ENCOUNTER — Other Ambulatory Visit: Payer: Self-pay

## 2019-04-04 ENCOUNTER — Ambulatory Visit (HOSPITAL_COMMUNITY)
Admission: EM | Admit: 2019-04-04 | Discharge: 2019-04-04 | Disposition: A | Discharge status: Home / Self Care | Payer: Managed Care, Other (non HMO) | Attending: Family Medicine | Admitting: Family Medicine

## 2019-04-04 DIAGNOSIS — W540XXA Bitten by dog, initial encounter: Secondary | ICD-10-CM | POA: Diagnosis not present

## 2019-04-04 DIAGNOSIS — S61251A Open bite of left index finger without damage to nail, initial encounter: Secondary | ICD-10-CM

## 2019-04-04 MED ORDER — AMOXICILLIN-POT CLAVULANATE 875-125 MG PO TABS: 1.0000 | ORAL_TABLET | Freq: Two times a day (BID) | ORAL | 0 refills | Status: AC

## 2019-04-04 NOTE — Discharge Instructions (Signed)
Begin Augmentin twice daily for the next week Keep wound clean and dry, wash with warm soapy water 2-3 times daily, dry well after getting wet. Please wrap wounds if working with hands If developing any signs of infection-increased redness swelling pain or drainage from wounds please follow-up  Please follow-up in emergency room if dog is not up-to-date on rabies vaccination.

## 2019-04-04 NOTE — ED Provider Notes (Signed)
[facility]    CSN: [id number] Arrival date & time: 04/04/19  1834      History   Chief Complaint Chief Complaint  Patient presents with  . Dog Bite    HPI Sierra Perez is a 59 y.o. female history of prior DVT, presenting today for evaluation of wound check from a dog bite.  Patient was bit on her left index finger from a neighbors dog.  This is a Librarian, academic, but the dog and neighbor is unknown.  She is unsure of vaccination status of rabies at this time.  Her main concern was concern for infection and need for antibiotics.  Her tetanus is up-to-date, last Tdap on 9/19.  She denies difficulty bending finger.  She also sustained small abrasion to right index finger, also denying difficulty bending finger.  Patient filed a police report and plans to have animal control follow-up on status of vaccination of dog.  Patient would like to defer initiation of rabies postexposure prophylaxis temporarily until tomorrow to see if dog is up-to-date on vaccines.  HPI  Past Medical History:  Diagnosis Date  . Anxiety   . Breast cancer ([place]) 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, her-2 positive, ER/PR negative  . Clotting disorder ([place])    testing was negative, h/o DVT while on treatment  . Complex regional pain syndrome i of right lower limb   . DVT (deep venous thrombosis) ([place])    started in 2015, multiple  . Fibroid   . Heart murmur   . HPV in female   . IBS (irritable bowel syndrome)   . STD (sexually transmitted disease)    HSV  . Thyroid disease     Patient Active Problem List   Diagnosis Date Noted  . 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 DVT (deep venous thrombosis) ([place]) 05/04/2018  . Malignant neoplasm of upper-outer quadrant of right breast in female, estrogen receptor negative ([place]) 05/03/2018    Past Surgical History:  Procedure Laterality Date  . ABDOMINAL HYSTERECTOMY  2010   fibroids, h/o abnormal pap smears  . BREAST SURGERY Right 2015   right lumpectomy with reduction  and lift  . DENTAL SURGERY    . FOOT SURGERY     dislocated toe  . [place] RESECTION  2014  . LAPAROSCOPIC OOPHERECTOMY Bilateral 2016  . REPLACEMENT TOTAL KNEE Left 2018   also had 3 prior arthroscopies  . VENA CAVA FILTER PLACEMENT  2017    OB History    Gravida  1   Para  1   Term  1   Preterm      AB      Living  1     SAB      TAB      Ectopic      Multiple      Live Births  1            Home Medications    Prior to Admission medications   Medication Sig Start Date End Date Taking? Authorizing Provider  albuterol (VENTOLIN HFA) 108 (90 Base) MCG/ACT inhaler SMARTSIG:1-2 Puff(s) By Mouth Every 4-6 Hours PRN 01/18/19   [provider]  AMITIZA 8 MCG capsule Take 8 mcg by mouth 2 (two) times daily. 02/17/19   [provider]  amoxicillin-clavulanate (AUGMENTIN) 875-125 MG tablet Take 1 tablet by mouth every 12 (twelve) hours for 7 days. 04/04/19 04/11/19  Shatoria Stooksbury C, PA-C  apixaban (ELIQUIS) 5 MG TABS tablet Take 1 tablet (5  mg total) by mouth 2 (two) times daily. 02/23/19   Nicholas Lose, MD  B Complex Vitamins (B COMPLEX 1 PO) vitamin B complex capsule    [provider]  Bacillus Coagulans-Inulin (PROBIOTIC) 1-250 BILLION-MG CAPS Probiotic    [provider]  Cholecalciferol (VITAMIN D-3 PO) Vitamin D3    [provider]  DULoxetine (CYMBALTA) 30 MG capsule Take 3 capsules (90 mg total) by mouth daily. 11/09/18   Nicholas Lose, MD  fluticasone (FLONASE) 50 MCG/ACT nasal spray Place 1 spray into both nostrils daily. 11/09/18   Nicholas Lose, MD  gabapentin (NEURONTIN) 300 MG capsule Take 2 capsules (600 mg total) by mouth 2 (two) times daily. 11/09/18   Nicholas Lose, MD  ipratropium (ATROVENT HFA) 17 MCG/ACT inhaler Inhale 2 puffs into the lungs every 6 (six) hours as needed for wheezing. 11/09/18   Nicholas Lose, MD  levothyroxine (SYNTHROID) 75 MCG tablet Take 1 tablet (75 mcg total) by mouth  daily before breakfast. 11/09/18   Nicholas Lose, MD  NONFORMULARY OR COMPOUNDED ITEM Vitamin E vaginal suppositories 200u/ml.  One pv three times weekly. 03/03/19   Megan Salon, MD  pantoprazole (PROTONIX) 40 MG tablet Take 1 tablet (40 mg total) by mouth daily. 11/09/18   Nicholas Lose, MD  valACYclovir (VALTREX) 500 MG tablet Take 1 tablet (500 mg total) by mouth daily. 02/20/19   Megan Salon, MD    Family History Family History  Problem Relation Age of Onset  . Hypertension Mother   . Diabetes Father   . Kidney disease Father   . Dementia Father   . Factor VIII deficiency Father   . Bladder Cancer Maternal Grandmother   . Stroke Maternal Grandmother   . Lung cancer Maternal Grandfather   . Colon cancer Maternal Grandfather   . Cancer Paternal Grandmother        "female"    Social History Social History   Tobacco Use  . Smoking status: Never Smoker  . Smokeless tobacco: Never Used  Substance Use Topics  . Alcohol use: Yes    Comment: occ  . Drug use: Never     Allergies   Succinylcholine and Sulfa antibiotics   Review of Systems Review of Systems  Constitutional: Negative for fatigue and fever.  Eyes: Negative for visual disturbance.  Respiratory: Negative for shortness of breath.   Cardiovascular: Negative for chest pain.  Gastrointestinal: Negative for abdominal pain, nausea and vomiting.  Musculoskeletal: Positive for arthralgias. Negative for joint swelling.  Skin: Positive for wound. Negative for color change and rash.  Neurological: Negative for dizziness, weakness, light-headedness and headaches.     Physical Exam Triage Vital Signs ED Triage Vitals  Enc Vitals Group     BP 04/04/19 1917 (!) 164/92     Pulse Rate 04/04/19 1917 83     Resp 04/04/19 1917 18     Temp 04/04/19 1917 98.5 F (36.9 C)     Temp Source 04/04/19 1917 Oral     SpO2 04/04/19 1917 100 %     Weight --      Height --      Head Circumference --      Peak Flow --       Pain Score 04/04/19 1913 4     Pain Loc --      Pain Edu? --      Excl. in [place]? --    No data found.  Updated Vital Signs BP (!) 164/92 (BP Location: Left Arm)  Pulse 83   Temp 98.5 F (36.9 C) (Oral)   Resp 18   SpO2 100%   Visual Acuity Right Eye Distance:   Left Eye Distance:   Bilateral Distance:    Right Eye Near:   Left Eye Near:    Bilateral Near:     Physical Exam Vitals and nursing note reviewed.  Constitutional:      Appearance: She is well-developed.     Comments: No acute distress  HENT:     Head: Normocephalic and atraumatic.     Nose: Nose normal.  Eyes:     Conjunctiva/sclera: Conjunctivae normal.  Cardiovascular:     Rate and Rhythm: Normal rate.  Pulmonary:     Effort: Pulmonary effort is normal. No respiratory distress.  Abdominal:     General: There is no distension.  Musculoskeletal:        General: Normal range of motion.     Cervical back: Neck supple.     Comments: Left index finger full active range of motion at DIP and PIP Right index finger full active range of motion DIP and PIP  Skin:    General: Skin is warm and dry.     Comments: Small superficial abrasion noted to the dorsal aspect of right PIP  Superficial abrasions noted to medial and lateral aspect of left index finger  Neurological:     Mental Status: She is alert and oriented to person, place, and time.      UC Treatments / Results  Labs (all labs ordered are listed, but only abnormal results are displayed) Labs Reviewed - No data to display  EKG   Radiology No results found.  Procedures Procedures (including critical care time)  Medications Ordered in UC Medications - No data to display  Initial Impression / Assessment and Plan / UC Course  I have reviewed the triage vital signs and the nursing notes.  Pertinent labs & imaging results that were available during my care of the patient were reviewed by me and considered in my medical decision making (see  chart for details).     Wounds are very superficial, do not warrant repair at this time, would expect gradual self healing.  Up-to-date on tetanus.  Discussed with patient recommendations for rabies postexposure prophylaxis.  Patient will plan to follow-up in emergency room for IgG if she is unable to determine rabies status of dog.  Initiated on Augmentin today for infection prevention.  Discussed wound care.  Do not suspect underlying fracture at this time.  Discussed strict return precautions. Patient verbalized understanding and is agreeable with plan.  Final Clinical Impressions(s) / UC Diagnoses   Final diagnoses:  Open wound of left index finger due to dog bite     Discharge Instructions     Begin Augmentin twice daily for the next week Keep wound clean and dry, wash with warm soapy water 2-3 times daily, dry well after getting wet. Please wrap wounds if working with hands If developing any signs of infection-increased redness swelling pain or drainage from wounds please follow-up  Please follow-up in emergency room if dog is not up-to-date on rabies vaccination.   ED Prescriptions    Medication Sig Dispense Auth. Provider   amoxicillin-clavulanate (AUGMENTIN) 875-125 MG tablet Take 1 tablet by mouth every 12 (twelve) hours for 7 days. 14 tablet Alexandro Line, [place] C, PA-C     PDMP not reviewed this encounter.   Janith Lima, [place] 04/04/19 2026

## 2019-04-04 NOTE — ED Notes (Signed)
Patient is reluctant to get rabies injections.  Patient requests to have wound evaluated, states ems suggested antibiotics.  Wound has a band-aid intact.  Patient had covid vaccine #2 on 2/9 and reports just now feeling better.  Patient reports police took report and contacting animal control.  Patient would like to get their feedback first, prior to rabies vaccines.  Spoke to [place], pa about patient

## 2019-04-04 NOTE — ED Triage Notes (Signed)
Pt presents with dog bite to left index finger from neighbors dog; pt is unsure of rabies status of dog.  Pt is up to date on tetanus September 2019

## 2019-04-11 ENCOUNTER — Other Ambulatory Visit: Payer: BLUE CROSS/BLUE SHIELD

## 2019-04-25 ENCOUNTER — Other Ambulatory Visit: Payer: Self-pay

## 2019-04-25 ENCOUNTER — Ambulatory Visit
Admission: RE | Admit: 2019-04-25 | Discharge: 2019-04-25 | Disposition: A | Discharge status: Home / Self Care | Payer: Managed Care, Other (non HMO) | Source: Ambulatory Visit | Attending: Radiology | Admitting: Radiology

## 2019-04-25 DIAGNOSIS — Z853 Personal history of malignant neoplasm of breast: Secondary | ICD-10-CM

## 2019-04-25 IMAGING — MR MR BREAST BILAT WO/W CM
8 of 12 series · 30 of 48 positions shown · IV contrast (gadavist)
Comparison: Previous exam(s).

CLINICAL DATA: 50-year-old female with history of right breast
cancer status post lumpectomy, radiation and chemotherapy in [OZ].
No current symptoms.

LABS:  None performed on site.
EXAM:
BILATERAL BREAST MRI WITH AND WITHOUT CONTRAST
TECHNIQUE: Multiplanar, multisequence MR images of both breasts were obtained
prior to and following the intravenous administration of 10 ml of
Gadavist.

[Series 2: t2_tirm_tra ipat (a-p) · axial · 3.0mm · 0.70mm/px · 1 of 65 slices shown]
[im 1/65]
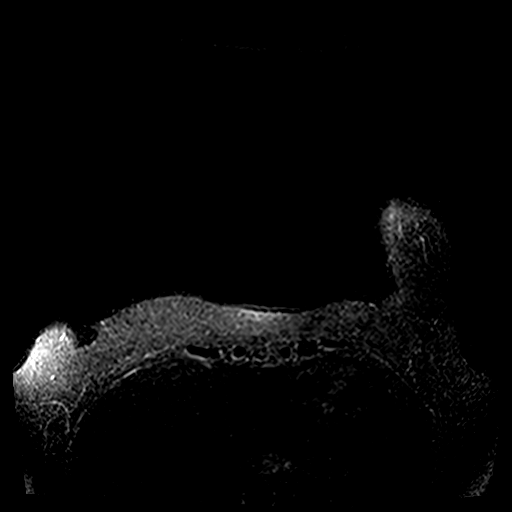

[Series 3: fl3d pre-cm no · axial · non-contrast · 0.9mm · 0.94mm/px · z∈[-66,+120]mm · 5 of 208 slices shown]
[im 1/208]
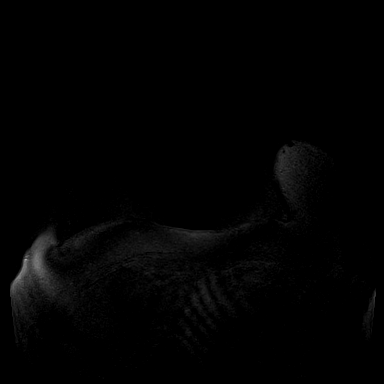
[im 52/208]
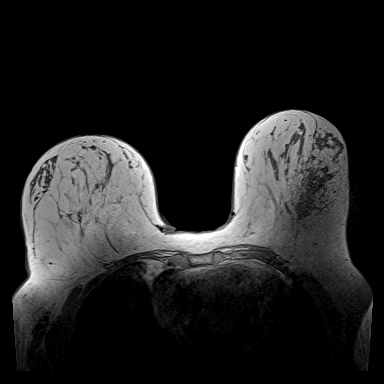
[im 104/208]
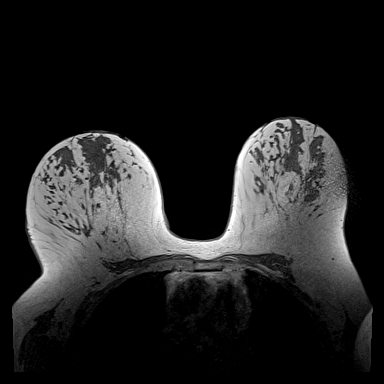
[im 156/208]
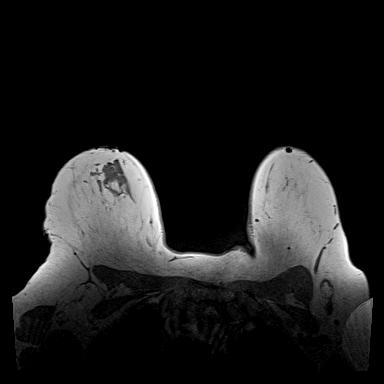
[im 208/208]
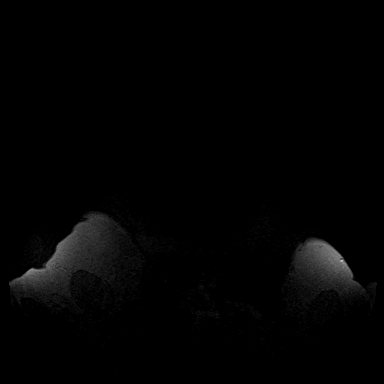

[Series 4: fl3d pre-cm · axial · non-contrast · 0.9mm · 0.87mm/px · z∈[-66,+120]mm · 5 of 208 slices shown]
[im 1/208]
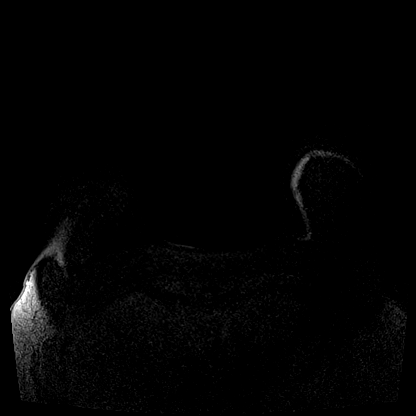
[im 52/208]
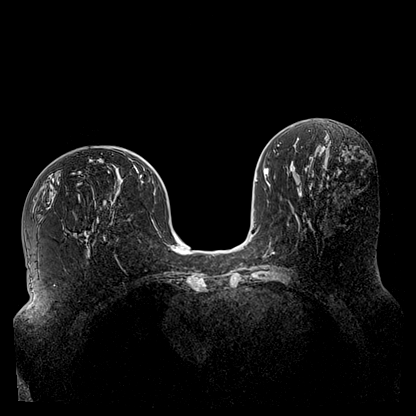
[im 104/208]
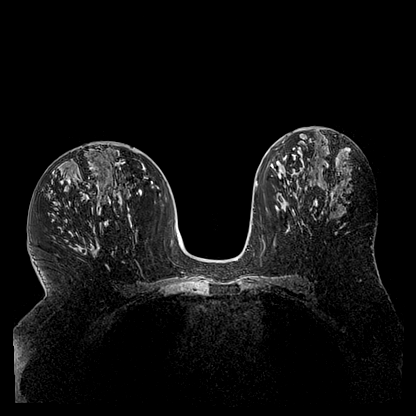
[im 156/208]
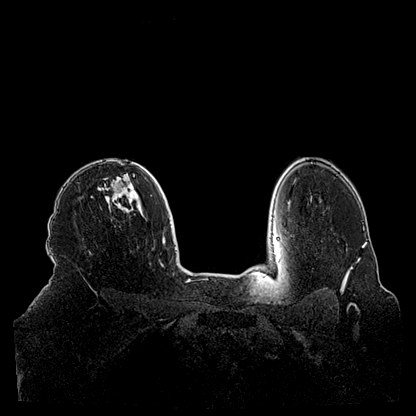
[im 208/208]
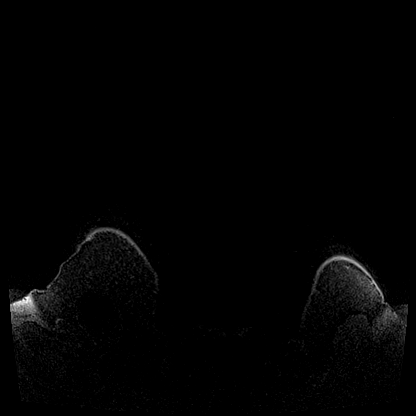

[Series 5: fl3d post-cm 20 · axial · 0.9mm · 0.87mm/px · z∈[-66,+120]mm · 5 of 208 slices shown (1 of 3)]
[im 1/208]
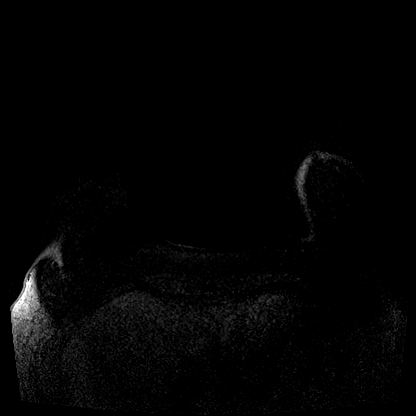
[im 52/208]
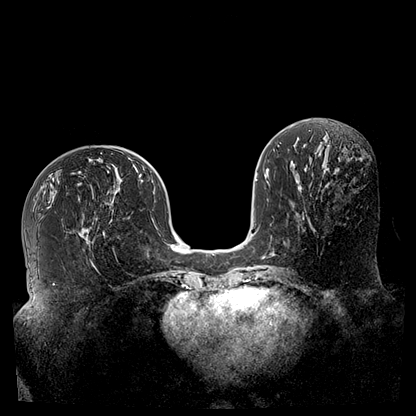
[im 104/208]
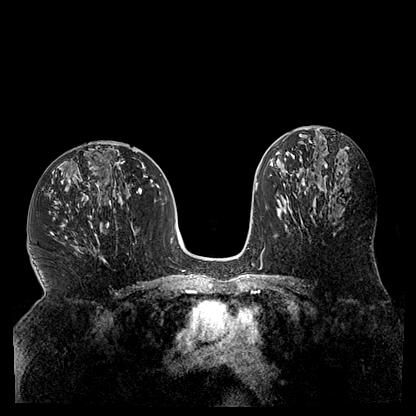
[im 156/208]
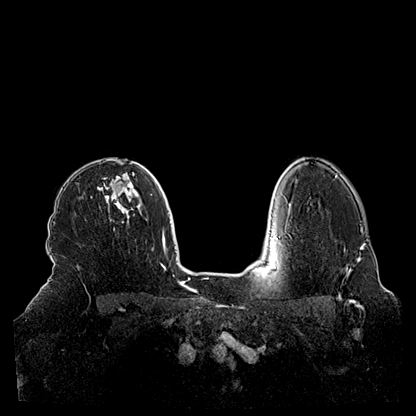
[im 208/208]
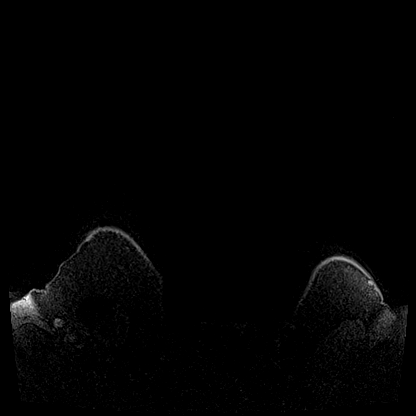

[Series 6: fl3d post-cm 20 · axial · 0.9mm · 0.87mm/px · z∈[-66,+120]mm · 5 of 208 slices shown (2 of 3)]
[im 1/208]
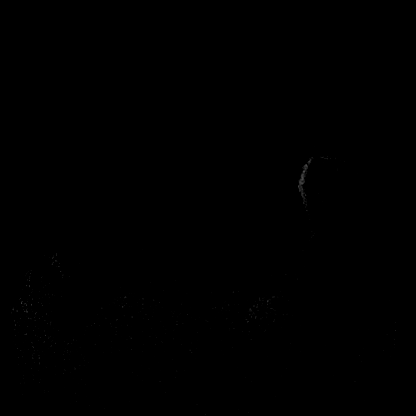
[im 52/208]
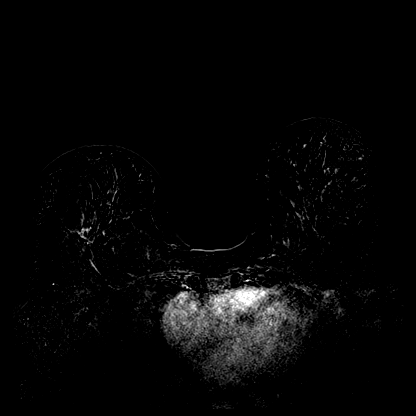
[im 104/208]
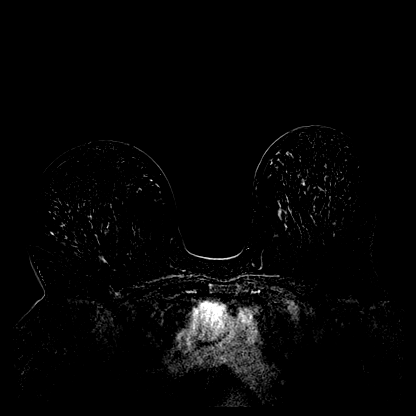
[im 156/208]
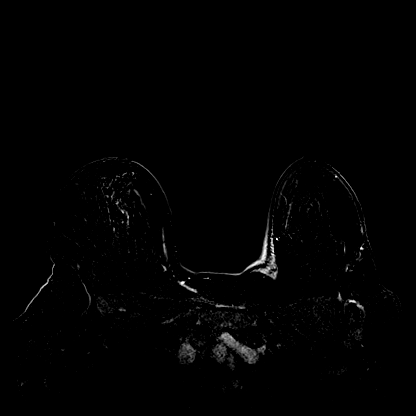
[im 208/208]
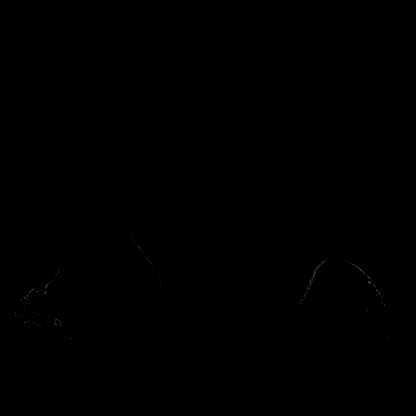

[Series 7: fl3d post-cm 20 · axial · 187.2mm · 0.87mm/px · 1 of 1 slices shown (3 of 3)]
[im 1/1]
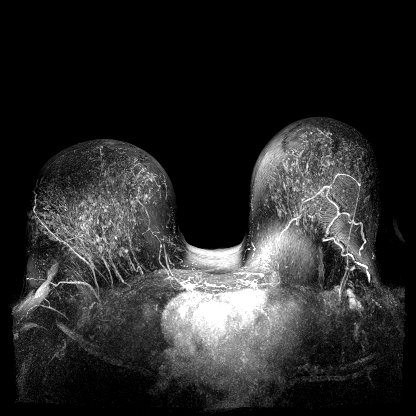

[Series 8: fl3d post-cm 3min · axial · 0.9mm · 0.87mm/px · z∈[-66,+120]mm · 6 of 208 slices shown]
[im 1/208]
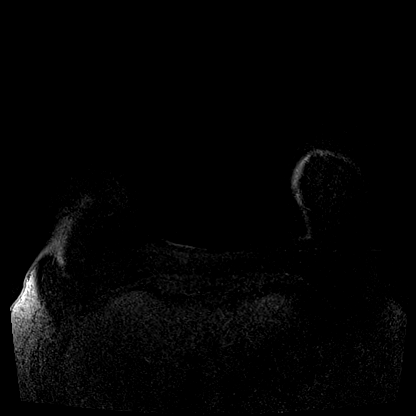
[im 42/208]
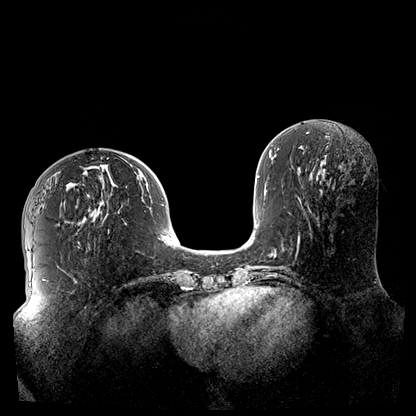
[im 83/208]
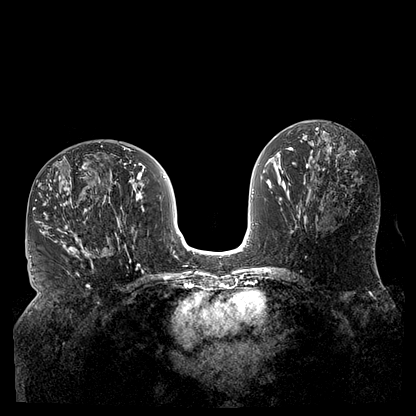
[im 125/208]
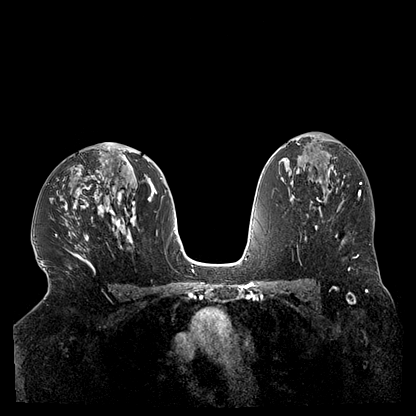
[im 166/208]
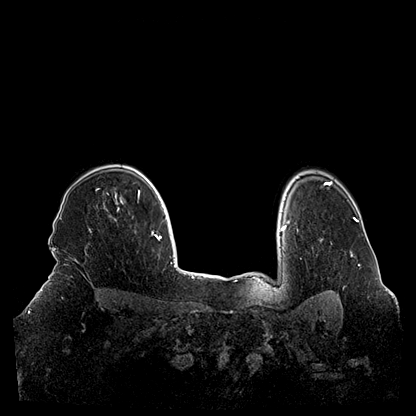
[im 208/208]
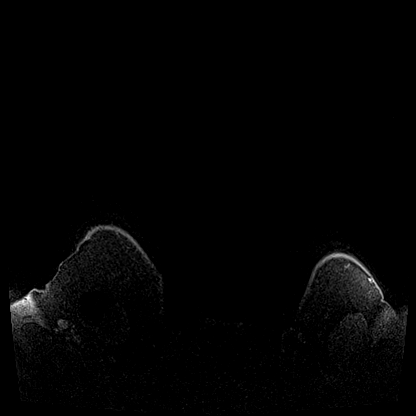

[Series 9: fl3d post-cm 3min_sub · axial · 0.9mm · 0.87mm/px · z∈[-66,-29]mm · 2 of 208 slices shown]
[im 1/208]
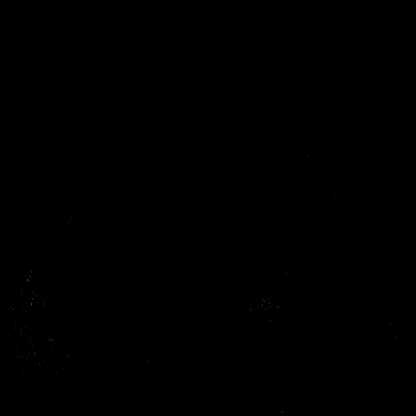
[im 42/208]
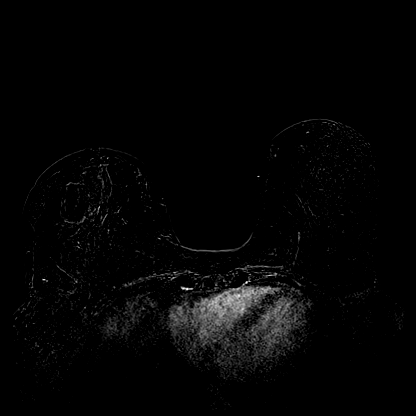

[30 of 48 positions shown; findings below may reference images not displayed]

Three-dimensional MR images were rendered by post-processing of the
original MR data on an independent workstation. The
three-dimensional MR images were interpreted, and findings are
reported in the following complete MRI report for this study. Three
dimensional images were evaluated at the independent [employer]
workstation
FINDINGS: Breast composition: c. Heterogeneous fibroglandular tissue.

Background parenchymal enhancement: Mild.

Right breast: No suspicious mass or abnormal enhancement.

Left breast: No suspicious mass or abnormal enhancement.

Lymph nodes: No abnormal appearing lymph nodes.

Ancillary findings:  None.
IMPRESSION: No MRI evidence of malignancy in either breast.

RECOMMENDATION:
Routine annual screening. The patient is due for her next bilateral
mammogram in [DATE].

BI-RADS CATEGORY  1: Negative.

## 2019-04-25 MED ORDER — GADOBUTROL 1 MMOL/ML IV SOLN
10.0000 mL | Freq: Once | INTRAVENOUS | Status: AC | PRN
  Administered 2019-04-25: 10 mL via INTRAVENOUS

## 2019-05-09 NOTE — Progress Notes (Signed)
Patient Care Team: Wenda Low, MD as PCP - General (Internal Medicine)  DIAGNOSIS:    ICD-10-CM   1. Malignant neoplasm of upper-outer quadrant of right breast in female, estrogen receptor negative ([place])  C50.411    Z17.1   2. 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, unspecified laterality (HCC)  I82.599     SUMMARY OF ONCOLOGIC HISTORY: Oncology History  Malignant neoplasm of upper-outer quadrant of right breast in female, estrogen receptor negative ([place])  08/27/2012 Initial Diagnosis   Stage 1a IDC with DCIS (T2N0M0) : ER-, PR-, HER positive 3+, Ki67 20-25%   09/06/2012 Breast MRI   Right breast, lateral to the nipple, 10x14x8m mass with mildly irregular borders   09/10/2012 Echocardiogram   EF 55-60%    Chemotherapy   Neoadjuvant Taxotere/Carboplatin/Herceptin/Perjeta followed by adjuvant Herceptin maintenance   01/09/2013 Breast MRI   Complete pathological response to neoadjuvant treatment: right breast mass previously noted at 9:00 position no longer identified.   02/13/2013 Surgery   Right breast lumpectomy: benign parenchyma with some stromal fibrosis consistent with previous tumor site, focal area of atypical lobular hyperplasia, all lymph nodes negative.     Radiation Therapy   Adjuvant radiation   04/06/2013 Imaging   DEXA: T-score of -1.1, osteopenia    09/25/2016 Initial Biopsy   Columnar cell alteration with atypia, foreign body granuloma     CHIEF COMPLIANT: Surveillance of right breast cancer and history of DVTs  INTERVAL HISTORY: Sierra Perez is a 59y.o. with above-mentioned history of multiple left lower extremity DVTs who is currently on treatment with Eliquis. She also has a history of right breast cancer treated with neoadjuvant chemotherapy, lumpectomy, and who is currently on surveillance. Breast MRI on 04/25/19 showed no evidence of malignancy bilaterally. She presents to the clinic today for follow-up.   ALLERGIES:  is  allergic to succinylcholine and sulfa antibiotics.  MEDICATIONS:  Current Outpatient Medications  Medication Sig Dispense Refill  . albuterol (VENTOLIN HFA) 108 (90 Base) MCG/ACT inhaler SMARTSIG:1-2 Puff(s) By Mouth Every 4-6 Hours PRN    . AMITIZA 8 MCG capsule Take 8 mcg by mouth 2 (two) times daily.    .Marland Kitchenapixaban (ELIQUIS) 5 MG TABS tablet Take 1 tablet (5 mg total) by mouth 2 (two) times daily. 180 tablet 3  . B Complex Vitamins (B COMPLEX 1 PO) vitamin B complex capsule    . Bacillus Coagulans-Inulin (PROBIOTIC) 1-250 BILLION-MG CAPS Probiotic    . Cholecalciferol (VITAMIN D-3 PO) Vitamin D3    . DULoxetine (CYMBALTA) 30 MG capsule Take 3 capsules (90 mg total) by mouth daily.    . fluticasone (FLONASE) 50 MCG/ACT nasal spray Place 1 spray into both nostrils daily.  2  . gabapentin (NEURONTIN) 300 MG capsule Take 2 capsules (600 mg total) by mouth 2 (two) times daily.    .Marland Kitchenipratropium (ATROVENT HFA) 17 MCG/ACT inhaler Inhale 2 puffs into the lungs every 6 (six) hours as needed for wheezing. 1 Inhaler 12  . levothyroxine (SYNTHROID) 75 MCG tablet Take 1 tablet (75 mcg total) by mouth daily before breakfast.    . NONFORMULARY OR COMPOUNDED ITEM Vitamin E vaginal suppositories 200u/ml.  One pv three times weekly. 36 each 3  . pantoprazole (PROTONIX) 40 MG tablet Take 1 tablet (40 mg total) by mouth daily.    . valACYclovir (VALTREX) 500 MG tablet Take 1 tablet (500 mg total) by mouth daily. 30 tablet 12   No current facility-administered medications for this  visit.    PHYSICAL EXAMINATION: ECOG PERFORMANCE STATUS: 1 - Symptomatic but completely ambulatory  There were no vitals filed for this visit. There were no vitals filed for this visit.  BREAST: No palpable masses or nodules in either right or left breasts. No palpable axillary supraclavicular or infraclavicular adenopathy no breast tenderness or nipple discharge. (exam performed in the presence of a chaperone)   ASSESSMENT &  PLAN:  Malignant neoplasm of upper-outer quadrant of right breast in female, estrogen receptor negative ([place]) 08/27/2012:Stage 1a IDC with DCIS (T2N0M0) : ER-, PR-, HER positive 3+, Ki67 20-25% Neoadjuvant chemotherapy with [place] Perjetafollowed by 1 year of Herceptin maintenance 02/13/2013:Right breast lumpectomy: benign parenchyma with some stromal fibrosis consistent with previous tumor site, focal area of atypical lobular hyperplasia, all lymph nodes negative. Adjuvant radiation therapy  Current treatment: Surveillance with annual mammograms andbreast MRIs Breast exam 05/10/19: Benign Breast MRI: 04/25/19: No evid of malgnancy  Patient wishes to come every 6 months for evaluations. We will see her back in 6 months with follow-up Patient sprained her right shoulder by working with a therapist.  She is in a lot of pain and discomfort.  I recommended massage therapy.  I sent a prescription for Flexeril 20 tablets.  DVT (deep venous thrombosis) (HCC) Recurrent DVTs while on anticoagulation, has an IVC filter She will remain on anticoagulation for life.(on Eliquis)    No orders of the defined types were placed in this encounter.  The patient has a good understanding of the overall plan. she agrees with it. she will call with any problems that may develop before the next visit here.  Total time spent: 20 mins including face to face time and time spent for planning, charting and coordination of care  Nicholas Lose, MD 05/10/2019  I, Cloyde Reams Dorshimer, am acting as scribe for Dr. Nicholas Lose.  I have reviewed the above documentation for accuracy and completeness, and I agree with the above.

## 2019-05-10 ENCOUNTER — Other Ambulatory Visit: Payer: Self-pay

## 2019-05-10 ENCOUNTER — Inpatient Hospital Stay: Payer: Managed Care, Other (non HMO) | Attending: Hematology and Oncology | Admitting: Hematology and Oncology

## 2019-05-10 DIAGNOSIS — C50411 Malignant neoplasm of upper-outer quadrant of right female breast: Secondary | ICD-10-CM

## 2019-05-10 DIAGNOSIS — Z86718 Personal history of other venous thrombosis and embolism: Secondary | ICD-10-CM | POA: Insufficient documentation

## 2019-05-10 DIAGNOSIS — Z79899 Other long term (current) drug therapy: Secondary | ICD-10-CM | POA: Diagnosis not present

## 2019-05-10 DIAGNOSIS — Z171 Estrogen receptor negative status [ER-]: Secondary | ICD-10-CM | POA: Diagnosis not present

## 2019-05-10 DIAGNOSIS — Z7901 Long term (current) use of anticoagulants: Secondary | ICD-10-CM | POA: Diagnosis not present

## 2019-05-10 DIAGNOSIS — Z853 Personal history of malignant neoplasm of breast: Secondary | ICD-10-CM | POA: Diagnosis present

## 2019-05-10 DIAGNOSIS — I82599 Chronic embolism and thrombosis of other specified deep vein of unspecified lower extremity: Secondary | ICD-10-CM | POA: Diagnosis not present

## 2019-05-10 MED ORDER — CYCLOBENZAPRINE HCL 5 MG PO TABS: 5.0000 mg | ORAL_TABLET | Freq: Three times a day (TID) | ORAL | 0 refills | Status: DC | PRN

## 2019-05-10 NOTE — Assessment & Plan Note (Signed)
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

## 2019-05-10 NOTE — Assessment & Plan Note (Signed)
08/27/2012:Stage 1a IDC with DCIS (T2N0M0) : ER-, PR-, HER positive 3+, Ki67 20-25% Neoadjuvant chemotherapy with [place] Perjetafollowed by 1 year of Herceptin maintenance 02/13/2013:Right breast lumpectomy: benign parenchyma with some stromal fibrosis consistent with previous tumor site, focal area of atypical lobular hyperplasia, all lymph nodes negative. Adjuvant radiation therapy  Current treatment: Surveillance with annual mammograms andbreast MRIs Breast exam 05/10/19: Benign Breast MRI: 04/25/19: No evid of malgnancy  Patient wishes to come every 6 months for evaluations. We will see her back in 6 months with follow-up

## 2019-05-12 ENCOUNTER — Telehealth: Payer: Self-pay | Admitting: Hematology and Oncology

## 2019-05-12 NOTE — Telephone Encounter (Signed)
Scheduled per 04/07 los, patient has been called and notified. 

## 2019-05-23 ENCOUNTER — Other Ambulatory Visit: Payer: Self-pay

## 2019-05-23 MED ORDER — APIXABAN 5 MG PO TABS: 5.0000 mg | ORAL_TABLET | Freq: Two times a day (BID) | ORAL | 0 refills | Status: DC

## 2019-05-29 NOTE — Progress Notes (Signed)
GYNECOLOGY  VISIT  CC:   Vaginal atrophy recheck  HPI: 59 y.o. G16P1001 Single White or Caucasian female here for 69mh follow up of vaginal bleeding after intercourse. Using vaginal vitamin E suppositories.  She's been using these twice weekly for about three months.  She's also noted increased vaginal irritation with some possible discharge today.  Not sure she's placing the suppositories high enough in the vagina.  Would like some possible solutions.  Other options discussed.    GYNECOLOGIC HISTORY: No LMP recorded. Patient has had a hysterectomy. Contraception: hysterectomy Menopausal hormone therapy: none  Patient Active Problem List   Diagnosis Date Noted  . 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 DVT (deep venous thrombosis) ([place] 05/04/2018  . Malignant neoplasm of upper-outer quadrant of right breast in female, estrogen receptor negative ([place] 05/03/2018    Past Medical History:  Diagnosis Date  . Anxiety   . Breast 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, her-2 positive, ER/PR negative  . Clotting disorder ([place]    testing was negative, h/o DVT while on treatment  . Complex regional pain syndrome i of right lower limb   . DVT (deep venous thrombosis) ([place]    started in 2015, multiple  . Fibroid   . Heart murmur   . HPV in female   . IBS (irritable bowel syndrome)   . STD (sexually transmitted disease)    HSV  . Thyroid disease     Past Surgical History:  Procedure Laterality Date  . ABDOMINAL HYSTERECTOMY  2010   fibroids, h/o abnormal pap smears  . BREAST SURGERY Right 2015   right lumpectomy with reduction and lift  . DENTAL SURGERY    . FOOT SURGERY     dislocated toe  . [place]  [place]  . LAPAROSCOPIC OOPHERECTOMY Bilateral 2016  . REPLACEMENT TOTAL KNEE Left 2018   also had 3 prior arthroscopies  . VENA CAVA FILTER PLACEMENT  2017    MEDS:   Current Outpatient Medications on File Prior to Visit  Medication Sig Dispense Refill  .  albuterol (VENTOLIN HFA) 108 (90 Base) MCG/ACT inhaler SMARTSIG:1-2 Puff(s) By Mouth Every 4-6 Hours PRN    . apixaban (ELIQUIS) 5 MG TABS tablet Take 1 tablet (5 mg total) by mouth 2 (two) times daily. 180 tablet 0  . B Complex Vitamins (B COMPLEX 1 PO) vitamin B complex capsule    . Bacillus Coagulans-Inulin (PROBIOTIC) 1-250 BILLION-MG CAPS Probiotic    . Cholecalciferol (VITAMIN D-3 PO) Vitamin D3    . DULoxetine (CYMBALTA) 30 MG capsule Take 3 capsules (90 mg total) by mouth daily.    . DULoxetine (CYMBALTA) 60 MG capsule Take 60 mg by mouth daily.    .Marland Kitchengabapentin (NEURONTIN) 600 MG tablet Take 600 mg by mouth 2 (two) times daily.    .Marland Kitchenipratropium (ATROVENT) 0.03 % nasal spray Place 2 sprays into both nostrils 2 (two) times daily.    .Marland Kitchenlevothyroxine (SYNTHROID) 50 MCG tablet Take 50 mcg by mouth every morning.    . NONFORMULARY OR COMPOUNDED ITEM Vitamin E vaginal suppositories 200u/ml.  One pv three times weekly. 36 each 3  . pantoprazole (PROTONIX) 40 MG tablet Take 1 tablet (40 mg total) by mouth daily.    . valACYclovir (VALTREX) 500 MG tablet Take 1 tablet (500 mg total) by mouth daily. (Patient not taking: Reported on 06/02/2019) 30 tablet 12   No current facility-administered medications on file prior to visit.    ALLERGIES: Succinylcholine and  Sulfa antibiotics  Family History  Problem Relation Age of Onset  . Hypertension Mother   . Diabetes Father   . Kidney disease Father   . Dementia Father   . Factor VIII deficiency Father   . Bladder Cancer Maternal Grandmother   . Stroke Maternal Grandmother   . Lung cancer Maternal Grandfather   . Colon cancer Maternal Grandfather   . Cancer Paternal Grandmother        "female"    SH:  Single, non smoker  Review of Systems  Constitutional: Negative.   HENT: Negative.   Eyes: Negative.   Respiratory: Negative.   Cardiovascular: Negative.   Gastrointestinal: Negative.   Endocrine: Negative.   Genitourinary: Negative.    Musculoskeletal: Negative.   Skin: Negative.   Allergic/Immunologic: Negative.   Neurological: Negative.   Psychiatric/Behavioral: Negative.     PHYSICAL EXAMINATION:    BP 120/80   Pulse 68   Temp (!) 97.2 F (36.2 C) (Skin)   Resp 16   Wt 220 lb (99.8 kg)   BMI 33.70 kg/m     General appearance: alert, cooperative and appears stated age Lymph:  no inguinal LAD noted  Pelvic: External genitalia:  no lesions              Urethra:  normal appearing urethra with no masses, tenderness or lesions              Bartholins and Skenes: normal                 Vagina: normal appearing vagina with some atrophic changes, no leasions              Cervix: no lesions              Bimanual Exam:  Uterus:  normal size, contour, position, consistency, mobility, non-tender              Adnexa: no mass, fullness, tenderness  Chaperone, Karmen Bongo, RN,was present for exam.  Assessment: Vaginal atrophic changes Vaginal irritation and mild discharge Needs BMD order  Plan: Affirm obtained today Other options for treatment discussed BMD order will be [facility] today to [facility].

## 2019-06-01 ENCOUNTER — Other Ambulatory Visit: Payer: Self-pay

## 2019-06-02 ENCOUNTER — Other Ambulatory Visit: Payer: Self-pay

## 2019-06-02 ENCOUNTER — Encounter: Payer: Self-pay | Admitting: Obstetrics & Gynecology

## 2019-06-02 ENCOUNTER — Ambulatory Visit: Payer: Managed Care, Other (non HMO) | Admitting: Obstetrics & Gynecology

## 2019-06-02 VITALS — BP 120/80 | HR 68 | Temp 97.2°F | Resp 16 | Wt 220.0 lb

## 2019-06-02 DIAGNOSIS — N898 Other specified noninflammatory disorders of vagina: Secondary | ICD-10-CM

## 2019-06-03 LAB — VAGINITIS/VAGINOSIS, DNA PROBE
Candida Species: NEGATIVE
Gardnerella vaginalis: POSITIVE — AB
Trichomonas vaginosis: NEGATIVE

## 2019-06-05 ENCOUNTER — Other Ambulatory Visit: Payer: Self-pay

## 2019-06-05 MED ORDER — METRONIDAZOLE 0.75 % VA GEL: 1.0000 | Freq: Every day | VAGINAL | 0 refills | Status: AC

## 2019-06-16 ENCOUNTER — Other Ambulatory Visit: Payer: Self-pay

## 2019-06-16 ENCOUNTER — Ambulatory Visit: Payer: Managed Care, Other (non HMO) | Admitting: Obstetrics & Gynecology

## 2019-06-16 ENCOUNTER — Telehealth: Payer: Self-pay | Admitting: *Deleted

## 2019-06-16 ENCOUNTER — Encounter: Payer: Self-pay | Admitting: Obstetrics & Gynecology

## 2019-06-16 VITALS — BP 110/72 | HR 68 | Temp 97.7°F | Resp 16 | Wt 225.0 lb

## 2019-06-16 DIAGNOSIS — N898 Other specified noninflammatory disorders of vagina: Secondary | ICD-10-CM | POA: Diagnosis not present

## 2019-06-16 MED ORDER — TINIDAZOLE 500 MG PO TABS: ORAL_TABLET | ORAL | 0 refills | Status: DC

## 2019-06-16 NOTE — Progress Notes (Signed)
GYNECOLOGY  VISIT  CC:   Vaginal discharge  HPI: 59 y.o. G7P1001 Single White or Caucasian female here for vaginitis symptoms.  Pt has been diagnosed with BV twice in the four months.  She has been experiencing irritation this week with some discharge.  Is not sure if BV is completely treated.  Also has PMP atrophic changes so this is confusing.  Has not been using Vit E.  Denies urinary symptoms.  Denies vaginal bleeding.    GYNECOLOGIC HISTORY: No LMP recorded. Patient has had a hysterectomy. Contraception: hysterectomy Menopausal hormone therapy: none  Patient Active Problem List   Diagnosis Date Noted  . 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 DVT (deep venous thrombosis) ([place]) 05/04/2018  . Malignant neoplasm of upper-outer quadrant of right breast in female, estrogen receptor negative ([place]) 05/03/2018    Past Medical History:  Diagnosis Date  . Anxiety   . Breast cancer ([place]) 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, her-2 positive, ER/PR negative  . Clotting disorder ([place])    testing was negative, h/o DVT while on treatment  . Complex regional pain syndrome i of right lower limb   . DVT (deep venous thrombosis) ([place])    started in 2015, multiple  . Fibroid   . Heart murmur   . HPV in female   . IBS (irritable bowel syndrome)   . STD (sexually transmitted disease)    HSV  . Thyroid disease     Past Surgical History:  Procedure Laterality Date  . ABDOMINAL HYSTERECTOMY  2010   fibroids, h/o abnormal pap smears  . BREAST SURGERY Right 2015   right lumpectomy with reduction and lift  . DENTAL SURGERY    . FOOT SURGERY     dislocated toe  . [place] RESECTION  2014  . LAPAROSCOPIC OOPHERECTOMY Bilateral 2016  . REPLACEMENT TOTAL KNEE Left 2018   also had 3 prior arthroscopies  . VENA CAVA FILTER PLACEMENT  2017    MEDS:   Current Outpatient Medications on File Prior to Visit  Medication Sig Dispense Refill  . albuterol (VENTOLIN HFA) 108 (90 Base) MCG/ACT inhaler  SMARTSIG:1-2 Puff(s) By Mouth Every 4-6 Hours PRN    . apixaban (ELIQUIS) 5 MG TABS tablet Take 1 tablet (5 mg total) by mouth 2 (two) times daily. 180 tablet 0  . B Complex Vitamins (B COMPLEX 1 PO) vitamin B complex capsule    . Bacillus Coagulans-Inulin (PROBIOTIC) 1-250 BILLION-MG CAPS Probiotic    . Cholecalciferol (VITAMIN D-3 PO) Vitamin D3    . DULoxetine (CYMBALTA) 30 MG capsule Take 3 capsules (90 mg total) by mouth daily.    . DULoxetine (CYMBALTA) 60 MG capsule Take 60 mg by mouth daily.    [name] gabapentin (NEURONTIN) 600 MG tablet Take 600 mg by mouth 2 (two) times daily.    [name] ipratropium (ATROVENT) 0.03 % nasal spray Place 2 sprays into both nostrils 2 (two) times daily.    [name] levothyroxine (SYNTHROID) 50 MCG tablet Take 50 mcg by mouth every morning.    . pantoprazole (PROTONIX) 40 MG tablet Take 1 tablet (40 mg total) by mouth daily.    . NONFORMULARY OR COMPOUNDED ITEM Vitamin E vaginal suppositories 200u/ml.  One pv three times weekly. (Patient not taking: Reported on 06/16/2019) 36 each 3  . valACYclovir (VALTREX) 500 MG tablet Take 1 tablet (500 mg total) by mouth daily. (Patient not taking: Reported on 06/02/2019) 30 tablet 12   No current facility-administered medications on file prior to visit.  ALLERGIES: Succinylcholine and Sulfa antibiotics  Family History  Problem Relation Age of Onset  . Hypertension Mother   . Diabetes Father   . Kidney disease Father   . Dementia Father   . Factor VIII deficiency Father   . Bladder Cancer Maternal Grandmother   . Stroke Maternal Grandmother   . Lung cancer Maternal Grandfather   . Colon cancer Maternal Grandfather   . Cancer Paternal Grandmother        "female"    SH:  Single, non smoker  Review of Systems  Constitutional: Negative.   HENT: Negative.   Eyes: Negative.   Respiratory: Negative.   Cardiovascular: Negative.   Gastrointestinal: Negative.   Endocrine: Negative.   Genitourinary:       Vaginal irritation   Musculoskeletal: Negative.   Skin: Negative.   Allergic/Immunologic: Negative.   Neurological: Negative.   Psychiatric/Behavioral: Negative.     PHYSICAL EXAMINATION:    BP 110/72   Pulse 68   Temp 97.7 F (36.5 C) (Skin)   Resp 16   Wt 225 lb (102.1 kg)   BMI 34.46 kg/m     General appearance: alert, cooperative and appears stated age Lymph:  no inguinal LAD noted  Pelvic: External genitalia:  no lesions              Urethra:  normal appearing urethra with no masses, tenderness or lesions              Bartholins and Skenes: normal                 Vagina: atrophic appearing vaginal mucosa with minimal discharge, no odor, no lesions              Cervix: absent              Bimanual Exam:  Uterus:  uterus absent              Adnexa: no mass, fullness, tenderness  Chaperone, Royal Hawthorn, CMA, was present for exam.  Assessment: Vaginal irritation and discharge Atrophic changes H/o DVT, on Eliquis  Plan: Vaginitis testing obtained.  If BV is again positive, will plan to treat for recurrent BV.  If negative, then this is due to atrophy and she willl start the Vit E vaginal suppositories that we discussed.  Recurrent BV treatment discussed Rx for tindamax 1 gram po daily x 5 days to pharmacy in case symptoms worsen over weekend.

## 2019-06-16 NOTE — Telephone Encounter (Signed)
Patient finished medication and is still having symptoms.

## 2019-06-16 NOTE — Telephone Encounter (Signed)
OV 4/30 vaginal irritation + BV, treated with Metrogel on 5/3 x 5 days.  H/O + BV 3 months ago, treated with Metrogel   Spoke to pt. Pt states still having vaginal  Irritation and odor  that she is notices more during the day since finishing medication x 1 week ago. States sx not bothersome at night or first thing in morning. Pt denies fever, chills, vaginal discharge. Advised will review with Dr Sabra Heck and return call to pt. Pt agreeable.   Routing to Dr Sabra Heck.

## 2019-06-16 NOTE — Telephone Encounter (Signed)
Reviewed with Dr Sabra Heck. Advised OV.  Call placed to pt. Spoke with pt. Pt given recommendations for OV. Pt agreeable.  Pt scheduled for OV per Dr Sabra Heck at 1245 today 06/16/19. Pt verbalized understanding. CPS neg.   Routing to Dr Sabra Heck for review.  Encounter closed.

## 2019-06-18 LAB — NUSWAB BV AND CANDIDA, NAA
Candida albicans, NAA: NEGATIVE
Candida glabrata, NAA: NEGATIVE

## 2019-06-19 ENCOUNTER — Encounter: Payer: Self-pay | Admitting: Obstetrics & Gynecology

## 2019-09-26 NOTE — Progress Notes (Signed)
GYNECOLOGY  VISIT  CC:   Recheck after starting Vit E suppositories  HPI: 59 y.o. G4P1001 Single White or Caucasian female here for 23mh follow up of vaginal e suppository use.  She has been using this for about three months.  Bleeding with intercourse is much improved.  This is only now occasionally very light.    She has noticed a sore area between the vagina and rectum.  She has some discomfort with sitting.  Denies vaginal bleeding or discharge.  Has also noticed decreased libido.  She has been on Cymbalta 973m  She has decreased back to 6048mut she hasn't noticed any improvement in libido.  Is back with ex-boyfriend but this isn't a great relationship so she's having trouble distinguishing whether this is mental or organic.    GYNECOLOGIC HISTORY: No LMP recorded. Patient has had a hysterectomy. Contraception: hysterectomy Menopausal hormone therapy: none  Patient Active Problem List   Diagnosis Date Noted  . 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 DVT (deep venous thrombosis) ([place]4/02/2018  . Malignant neoplasm of upper-outer quadrant of right breast in female, estrogen receptor negative ([place]    Past Medical History:  Diagnosis Date  . Anxiety   . Breast cancer ([place]   Invasive ductal, her-2 positive, ER/PR negative  . Cl[place] disorder ([place]  testing was negative, h/o DVT while on treatment  . Complex regional pain syndrome i of right lower limb   . DVT (deep venous thrombosis) ([place]  started in 2015, multiple  . Fibroid   . Heart murmur   . HPV in female   . IBS (irritable bowel syndrome)   . STD (sexually transmitted disease)    HSV  . Thyroid disease     Past Surgical History:  Procedure Laterality Date  . ABDOMINAL HYSTERECTOMY  2010   fibroids, h/o abnormal pap smears  . BREAST SURGERY Right 2015   right lumpectomy with reduction and lift  . DENTAL SURGERY    . FOOT SURGERY     dislocated toe  . [place]  [place]   . LAPAROSCOPIC OOPHERECTOMY Bilateral 2016  . REPLACEMENT TOTAL KNEE Left 2018   also had 3 prior arthroscopies  . VENA CAVA FILTER PLACEMENT  2017    MEDS:   Current Outpatient Medications on File Prior to Visit  Medication Sig Dispense Refill  . albuterol (VENTOLIN HFA) 108 (90 Base) MCG/ACT inhaler SMARTSIG:1-2 Puff(s) By Mouth Every 4-6 Hours PRN    . apixaban (ELIQUIS) 5 MG TABS tablet Take 1 tablet (5 mg total) by mouth 2 (two) times daily. 180 tablet 0  . B Complex Vitamins (B COMPLEX 1 PO) vitamin B complex capsule    . Bacillus Coagulans-Inulin (PROBIOTIC) 1-250 BILLION-MG CAPS Probiotic    . Cholecalciferol (VITAMIN D-3 PO) Vitamin D3    . DULoxetine (CYMBALTA) 30 MG capsule Take 3 capsules (90 mg total) by mouth daily.    . DULoxetine (CYMBALTA) 60 MG capsule Take 60 mg by mouth daily.    . fluticasone (FLONASE) 50 MCG/ACT nasal spray Place into both nostrils daily.    . gMarland Kitchenbapentin (NEURONTIN) 600 MG tablet Take 600 mg by mouth 2 (two) times daily.    . iMarland Kitchenratropium (ATROVENT) 0.03 % nasal spray Place 2 sprays into both nostrils 2 (two) times daily.    . lMarland Kitchenvothyroxine (SYNTHROID) 50 MCG tablet Take 50 mcg by mouth every morning.    . NONFORMULARY OR COMPOUNDED ITEM Vitamin E vaginal suppositories  200u/ml.  One pv three times weekly. 36 each 3  . pantoprazole (PROTONIX) 40 MG tablet Take 1 tablet (40 mg total) by mouth daily.     No current facility-administered medications on file prior to visit.    ALLERGIES: Pollen extract, Succinylcholine, and Sulfa antibiotics  Family History  Problem Relation Age of Onset  . Hypertension Mother   . Diabetes Father   . Kidney disease Father   . Dementia Father   . Factor VIII deficiency Father   . Bladder Cancer Maternal Grandmother   . Stroke Maternal Grandmother   . Lung cancer Maternal Grandfather   . Colon cancer Maternal Grandfather   . Cancer Paternal Grandmother        "female"    SH:  Divorced, non smoker  Review  of Systems  Constitutional: Negative.   HENT: Negative.   Eyes: Negative.   Respiratory: Negative.   Cardiovascular: Negative.   Gastrointestinal: Negative.   Endocrine: Negative.   Genitourinary:       Pain in vaginal area  Musculoskeletal: Negative.   Skin: Negative.   Allergic/Immunologic: Negative.   Neurological: Negative.   Hematological: Negative.   Psychiatric/Behavioral: Negative.     PHYSICAL EXAMINATION:    BP 114/70   Pulse 68   Resp 16   Wt 222 lb (100.7 kg)   BMI 33.75 kg/m     General appearance: alert, cooperative and appears stated age Lymph:  no inguinal LAD noted  Pelvic: External genitalia:  On perineal body are 5 ulcerated, open lesions c/w HSV              Urethra:  normal appearing urethra with no masses, tenderness or lesions              Bartholins and Skenes: normal                 Vagina: normal appearing vagina with normal color and discharge, no lesions              Cervix: no lesions              Bimanual Exam:  Uterus:  normal size, contour, position, consistency, mobility, non-tender              Adnexa: no mass, fullness, tenderness              Anus:  no lesions  Chaperone, Terence Lux, CMA, was present for exam.  Assessment: Atrophic vaginal changes and h/o bleeding/spotting with intercourse that is much improved with Vit E vaginal suppositories Perineal body HSV noted today (this presentation is different than in prior times with pt) H/o DVT 2015 Decreased libido  Plan: Total testosterone level obtained today.  Possible treatment options reviewed with pt.  We discussed relationship of SSRI and SNRI class of medications to libido issues.  She does not feel it would be a good idea to stop or decrease her Cymbalta any further at this time HSV PCR obtained today Rx for valtrex 1 gram bid x 10 days given as this presentation is not like when has experiences vulvar HSV I in the past.  Could be new infection.   Continue Vit E vaginal  suppositories.  Rx done for year initially.   32 minutes spent with pt

## 2019-09-28 ENCOUNTER — Ambulatory Visit: Payer: Managed Care, Other (non HMO) | Admitting: Orthopaedic Surgery

## 2019-09-28 ENCOUNTER — Ambulatory Visit (INDEPENDENT_AMBULATORY_CARE_PROVIDER_SITE_OTHER): Payer: Managed Care, Other (non HMO)

## 2019-09-28 ENCOUNTER — Encounter: Payer: Self-pay | Admitting: Orthopaedic Surgery

## 2019-09-28 VITALS — Ht 68.0 in | Wt 220.0 lb

## 2019-09-28 DIAGNOSIS — M25551 Pain in right hip: Secondary | ICD-10-CM

## 2019-09-28 DIAGNOSIS — M25559 Pain in unspecified hip: Secondary | ICD-10-CM

## 2019-09-28 NOTE — Progress Notes (Signed)
Office Visit Note   Patient: Sierra Perez           Date of Birth: 1960/11/23           MRN: [id number] Visit Date: 09/28/2019              Requested by: Wenda Low, MD [facility] [address],  [place] [id number] PCP: Wenda Low, MD   Assessment & Plan: Visit Diagnoses:  1. Hip pain     Plan:  We will obtain an MRI of the right hip to evaluate the cartilage.  Have her follow-up after the MRI to go over results discuss further treatment.  Questions were encouraged and answered Dr. Ninfa Linden and myself.  Follow-Up Instructions: Return After MRI.   Orders:  Orders Placed This Encounter  Procedures  . XR HIP UNILAT W OR W/O PELVIS 1V RIGHT   No orders of the defined types were placed in this encounter.     Procedures: No procedures performed   Clinical Data: No additional findings.   Subjective: Chief Complaint  Patient presents with  . Right Hip - Pain    HPI He is a 59 year old female were seen for the first time for right hip pain.  She moved here from [place] 2 years ago.  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  She has had a history of multiple lower extremity DVTs.  She has had a work-up for blood clotting disorder.  She is telling the her clotting is of unknown etiology.  She states that the clotting is felt to be due to the fact that she has had breast cancer.  She is on chronic blood thinners and has a Greenfield filter in place.  She reports 7 8 years ago she had an injury to her hip and had pain in her hip was told that she had a labral tear and needed a hip replacement however she underwent intra-articular injection and did well until recently and now she is having pain in the right hip groin region.  She denies any radicular symptoms down the leg.  She uses no assistive device.   Review of Systems  See HPI otherwise negative or  noncontributory.   Objective: Vital Signs: Ht _0  (1.727 m)   Wt 220 lb (99.8 kg)   BMI 33.45 kg/m   Physical Exam Constitutional:      Appearance: She is not ill-appearing or diaphoretic.  Pulmonary:     Effort: Pulmonary effort is normal.  Neurological:     Mental Status: She is alert and oriented to person, place, and time.  Psychiatric:        Mood and Affect: Mood normal.     Ortho Exam Bilateral hips she has fluid motion of both hips but has pain with internal rotation of the right hip.  Bilateral knees good range of motion of the left knee well-healed surgical incision from total knee arthroplasty no instability.  Right knee slight crepitus and tenderness along medial joint line.  No abnormal warmth erythema or effusion of the right knee. Specialty Comments:  No specialty comments available.  Imaging: XR HIP UNILAT W OR W/O PELVIS 1V RIGHT  Result Date: 09/28/2019 AP pelvis lateral view of the right hip: Bilateral hips well located.  Slight narrowing of the right hip joint space compared to the left.  Sclerotic activity in the femoral heads  bilaterally right greater than left.  No acute fractures.  No other bony abnormalities.    PMFS History: Patient Active Problem List   Diagnosis Date Noted  . 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 DVT (deep venous thrombosis) ([place]) 05/04/2018  . Malignant neoplasm of upper-outer quadrant of right breast in female, estrogen receptor negative ([place]) 05/03/2018   Past Medical History:  Diagnosis Date  . Anxiety   . Breast cancer ([place]) 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, her-2 positive, ER/PR negative  . Clotting disorder ([place])    testing was negative, h/o DVT while on treatment  . Complex regional pain syndrome i of right lower limb   . DVT (deep venous thrombosis) ([facility])    started in 2015, multiple  . Fibroid   . Heart murmur   . HPV in female   . IBS (irritable bowel syndrome)   . STD (sexually transmitted disease)    HSV  . Thyroid  disease     Family History  Problem Relation Age of Onset  . Hypertension Mother   . Diabetes Father   . Kidney disease Father   . Dementia Father   . Factor VIII deficiency Father   . Bladder Cancer Maternal Grandmother   . Stroke Maternal Grandmother   . Lung cancer Maternal Grandfather   . Colon cancer Maternal Grandfather   . Cancer Paternal Grandmother        "female"    Past Surgical History:  Procedure Laterality Date  . ABDOMINAL HYSTERECTOMY  2010   fibroids, h/o abnormal pap smears  . BREAST SURGERY Right 2015   right lumpectomy with reduction and lift  . DENTAL SURGERY    . FOOT SURGERY     dislocated toe  . [place] RESECTION  2014  . LAPAROSCOPIC OOPHERECTOMY Bilateral 2016  . REPLACEMENT TOTAL KNEE Left 2018   also had 3 prior arthroscopies  . VENA CAVA FILTER PLACEMENT  2017   Social History   Occupational History  . Not on file  Tobacco Use  . Smoking status: Never Smoker  . Smokeless tobacco: Never Used  Vaping Use  . Vaping Use: Never used  Substance and Sexual Activity  . Alcohol use: Yes    Comment: occ  . Drug use: Never  . Sexual activity: Not Currently    Birth control/protection: Surgical    Comment: Hysterectomy

## 2019-09-29 ENCOUNTER — Other Ambulatory Visit: Payer: Self-pay | Admitting: Radiology

## 2019-09-29 ENCOUNTER — Encounter: Payer: Self-pay | Admitting: Obstetrics & Gynecology

## 2019-09-29 ENCOUNTER — Ambulatory Visit: Payer: Managed Care, Other (non HMO) | Admitting: Obstetrics & Gynecology

## 2019-09-29 ENCOUNTER — Other Ambulatory Visit: Payer: Self-pay

## 2019-09-29 VITALS — BP 114/70 | HR 68 | Resp 16 | Wt 222.0 lb

## 2019-09-29 DIAGNOSIS — N766 Ulceration of vulva: Secondary | ICD-10-CM

## 2019-09-29 DIAGNOSIS — R6882 Decreased libido: Secondary | ICD-10-CM

## 2019-09-29 DIAGNOSIS — M25559 Pain in unspecified hip: Secondary | ICD-10-CM

## 2019-09-29 MED ORDER — VALACYCLOVIR HCL 1 G PO TABS: 1000.0000 mg | ORAL_TABLET | Freq: Two times a day (BID) | ORAL | 0 refills | Status: DC

## 2019-10-01 ENCOUNTER — Encounter: Payer: Self-pay | Admitting: Obstetrics & Gynecology

## 2019-10-01 LAB — TESTOSTERONE, TOTAL, LC/MS/MS: Testosterone, total: 10 ng/dL

## 2019-10-02 LAB — HSV DNA BY PCR (REFERENCE LAB)
HSV 2 DNA: NEGATIVE
HSV-1 DNA: POSITIVE — AB

## 2019-10-03 ENCOUNTER — Other Ambulatory Visit: Payer: Self-pay | Admitting: Obstetrics & Gynecology

## 2019-10-03 MED ORDER — VALACYCLOVIR HCL 500 MG PO TABS: 500.0000 mg | ORAL_TABLET | Freq: Every day | ORAL | 4 refills | Status: DC

## 2019-10-12 ENCOUNTER — Ambulatory Visit
Admission: RE | Admit: 2019-10-12 | Discharge: 2019-10-12 | Disposition: A | Discharge status: Home / Self Care | Payer: Managed Care, Other (non HMO) | Source: Ambulatory Visit | Attending: Physician Assistant | Admitting: Physician Assistant

## 2019-10-12 ENCOUNTER — Other Ambulatory Visit: Payer: Self-pay

## 2019-10-12 DIAGNOSIS — M25559 Pain in unspecified hip: Secondary | ICD-10-CM

## 2019-10-12 IMAGING — MR MR HIP*R* W/O CM
6 series · 40 of 40 positions shown · non-contrast
Comparison: Plain films right hip [DATE].

CLINICAL DATA: Chronic right hip pain.

EXAM:
MR OF THE RIGHT HIP WITHOUT CONTRAST
TECHNIQUE: Multiplanar, multisequence MR imaging was performed. No intravenous
contrast was administered.

[Series 3: T1 · coronal · 4.0mm · 1.19mm/px · 7 of 24 slices shown]
[im 1/24]
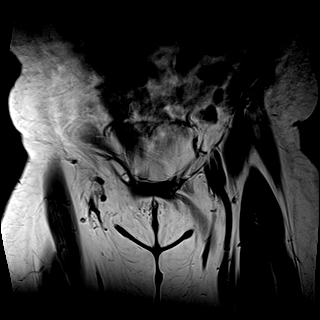
[im 4/24]
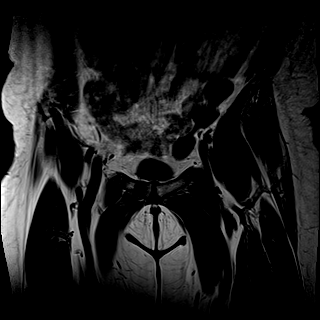
[im 8/24]
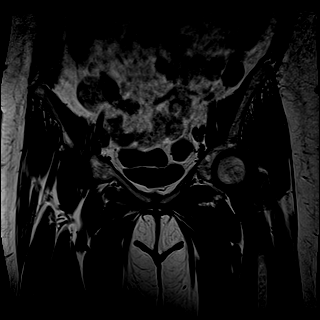
[im 12/24]
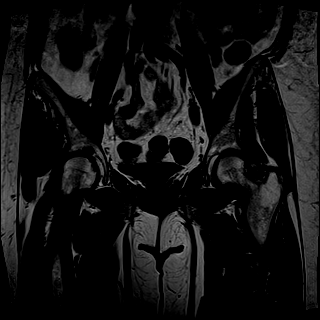
[im 16/24]
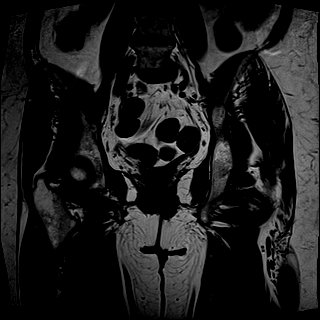
[im 20/24]
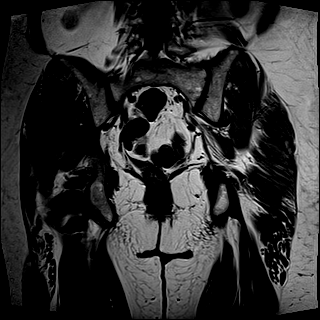
[im 24/24]
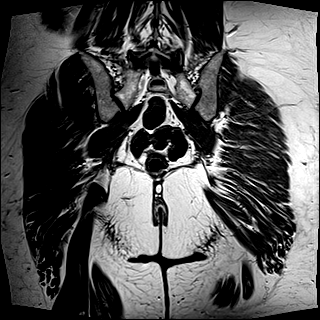

[Series 4: T2 fat-sat · coronal · 4.0mm · 1.19mm/px · 7 of 24 slices shown (1 of 2)]
[im 1/24]
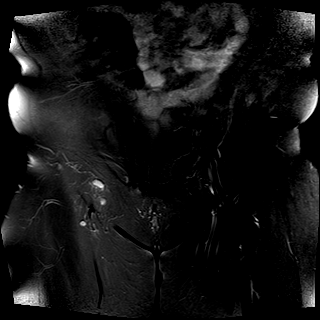
[im 4/24]
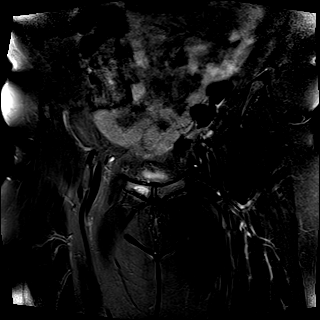
[im 8/24]
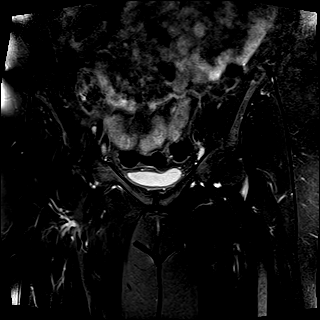
[im 12/24]
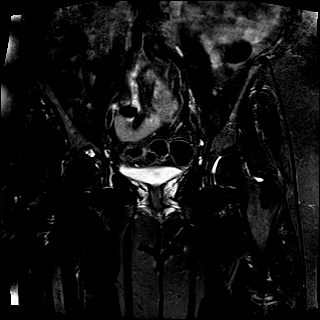
[im 16/24]
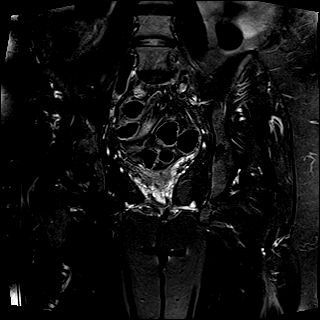
[im 20/24]
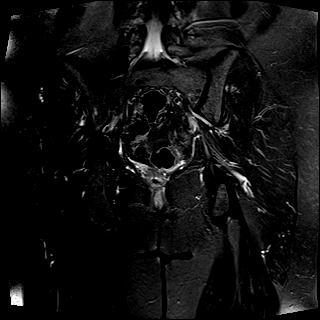
[im 24/24]
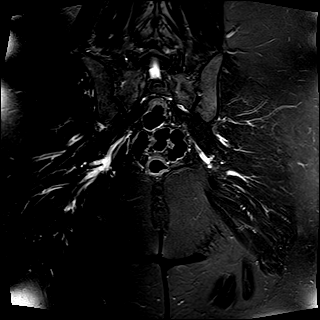

[Series 5: T2 fat-sat · axial · 4.0mm · 0.39mm/px · z∈[-79,+46]mm · 7 of 26 slices shown (2 of 2)]
[im 1/26]
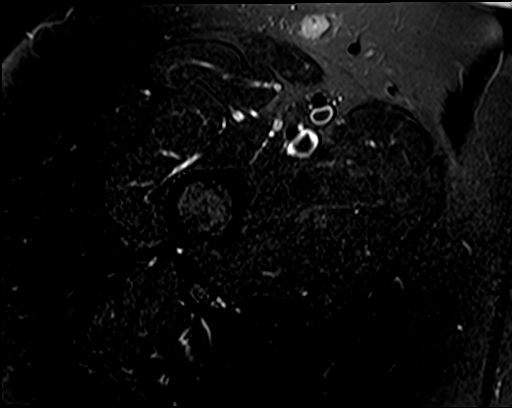
[im 5/26]
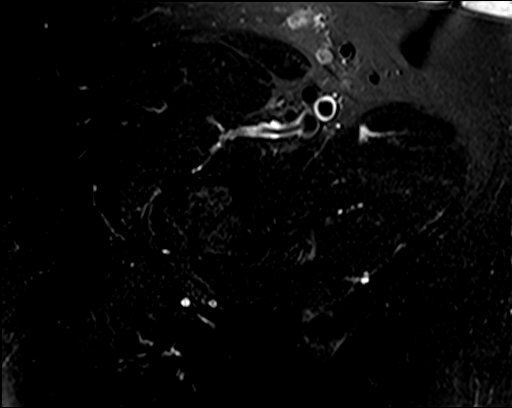
[im 9/26]
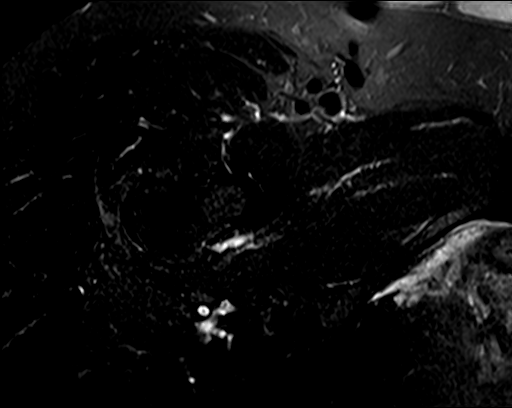
[im 13/26]
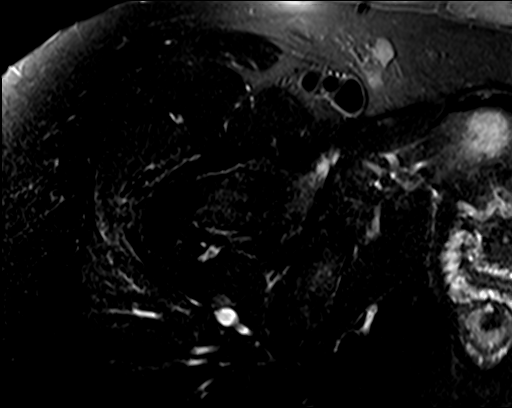
[im 17/26]
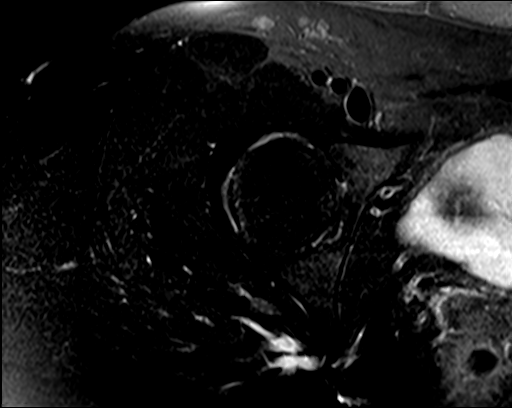
[im 21/26]
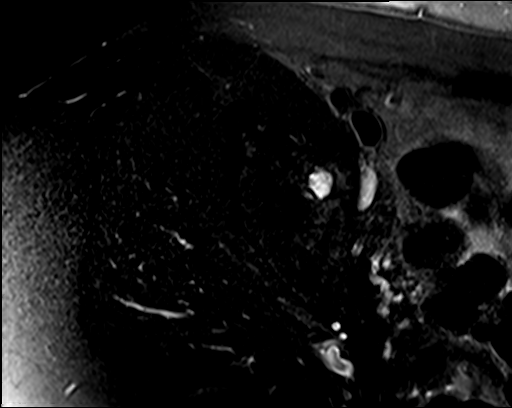
[im 26/26]
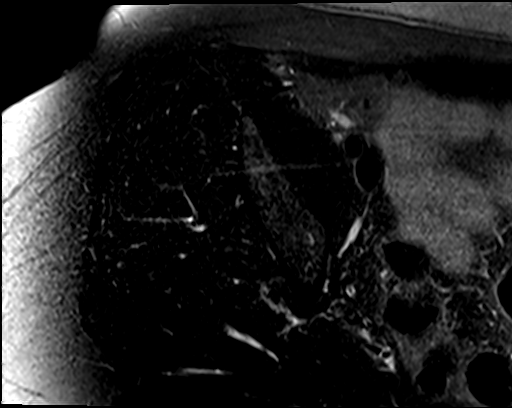

[Series 6: PD fat-sat · sagittal · 4.0mm · 0.70mm/px · 7 of 26 slices shown (1 of 3)]
[im 1/26]
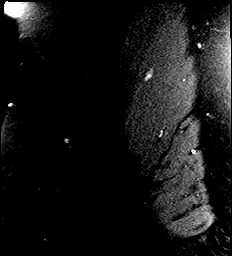
[im 5/26]
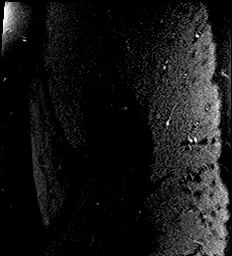
[im 9/26]
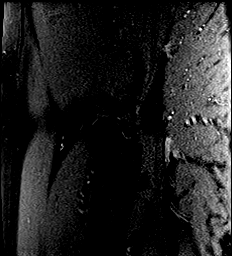
[im 13/26]
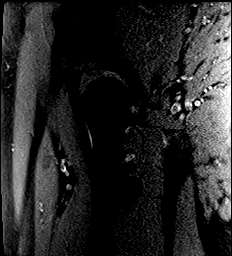
[im 17/26]
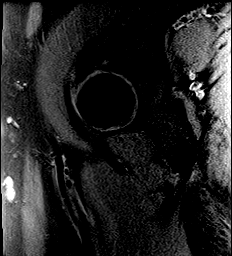
[im 21/26]
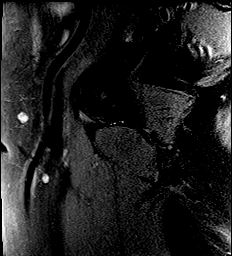
[im 26/26]
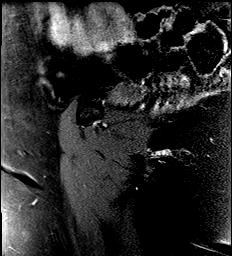

[Series 7: PD fat-sat · coronal · 4.0mm · 0.70mm/px · 5 of 19 slices shown (2 of 3)]
[im 1/19]
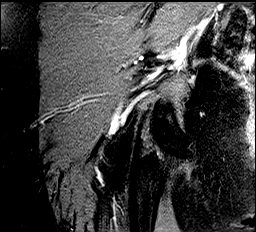
[im 5/19]
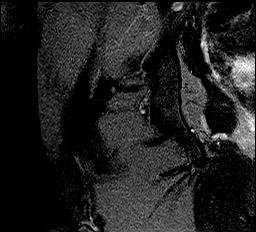
[im 10/19]
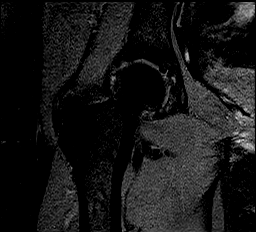
[im 14/19]
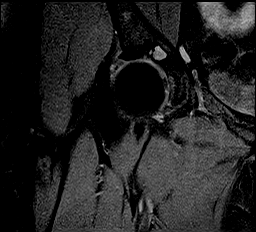
[im 19/19]
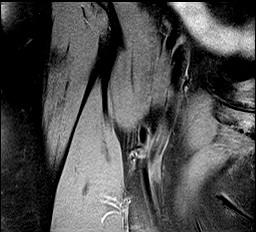

[Series 8: PD fat-sat · oblique · 4.0mm · 0.70mm/px · 7 of 24 slices shown (3 of 3)]
[im 1/24]
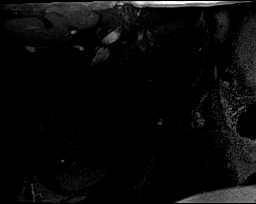
[im 4/24]
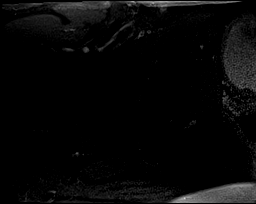
[im 8/24]
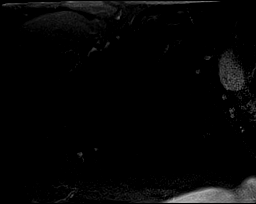
[im 12/24]
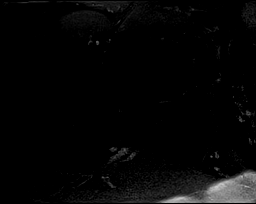
[im 16/24]
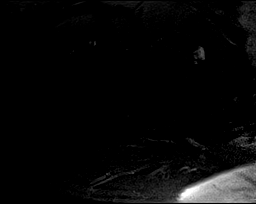
[im 20/24]
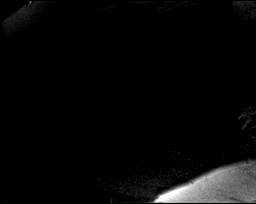
[im 24/24]
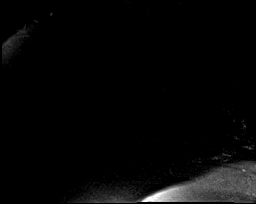

[40 of 40 positions shown; findings below may reference images not displayed]

FINDINGS: Bones: No fracture, stress change or worrisome lesion. Subchondral
cyst formation is seen in the right acetabulum and there is some
osteophytosis about the right hip. No avascular necrosis of the
femoral heads. No subchondral cyst formation or edema on the left.

Articular cartilage and labrum

Articular cartilage:  Diffusely thinned without focal defect.

Labrum: There is degenerative tearing of both the anterior and
superior labrum.

Joint or bursal effusion

Joint effusion:  None.

Bursae: Negative.

Muscles and tendons

Muscles and tendons:  Intact.

Other findings

Miscellaneous: Imaged intrapelvic contents demonstrate no acute or
focal abnormality.
IMPRESSION: Moderate to moderately severe right hip osteoarthritis with
associated degenerative tearing of the right anterior and superior
labrum. The right hip is otherwise negative.

## 2019-10-17 ENCOUNTER — Encounter: Payer: Self-pay | Admitting: Hematology and Oncology

## 2019-10-18 ENCOUNTER — Ambulatory Visit: Payer: Managed Care, Other (non HMO) | Admitting: Orthopaedic Surgery

## 2019-10-20 ENCOUNTER — Other Ambulatory Visit: Payer: Managed Care, Other (non HMO)

## 2019-10-25 ENCOUNTER — Encounter: Payer: Self-pay | Admitting: Orthopaedic Surgery

## 2019-10-25 ENCOUNTER — Ambulatory Visit: Payer: Managed Care, Other (non HMO) | Admitting: Orthopaedic Surgery

## 2019-10-25 ENCOUNTER — Ambulatory Visit: Payer: Self-pay

## 2019-10-25 DIAGNOSIS — M25559 Pain in unspecified hip: Secondary | ICD-10-CM | POA: Diagnosis not present

## 2019-10-25 DIAGNOSIS — M1611 Unilateral primary osteoarthritis, right hip: Secondary | ICD-10-CM | POA: Insufficient documentation

## 2019-10-25 NOTE — Progress Notes (Signed)
Subjective: Patient is here for ultrasound-guided intra-articular right hip injection.   Pain in the anterior pelvic area, and in the groin.  Objective:  Pain with passive IR, with adduction against resistance, and with palpation near the pubic symphysis.  Procedure: Ultrasound-guided right hip injection: After sterile prep with Betadine, injected 8 cc 1% lidocaine without epinephrine and 40 mg methylprednisolone using a 22-gauge spinal needle, passing the needle through the iliofemoral ligament into the femoral head/neck junction.  Injectate seen filling joint capsule.  Excellent immediate relief.

## 2019-10-25 NOTE — Progress Notes (Signed)
The patient comes in today to go over the MRI of her right hip.  She has had an intra-articular injection in the right hip but it was about a years ago.  She had an impaction injury with a fall remotely.  She is not a diabetic.  With continued pain in the groin with weightbearing activities it was prudent to obtain an MRI of her right hip.  She is a very active 59 year old female and is working to lose weight as well.  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  She also has a history of RSD in the past after knee surgeries.  Examination of her right hip shows it moves smoothly and fluidly but there is pain in the groin on extremes of rotation.  The MRI of the right hip does show a chronic degenerative labral tear from anterior labrum to the superior labrum.  There is also cystic changes in the acetabulum and significant cartilage thinning of the femoral head.  There is no full-thickness cartilage defects.  She does understand she has moderate arthritis of her right hip.  She does want to try at least 1 more intra-articular injection in her right hip and I agree with this given the fact that the last 1 lasted her for so long and her right hip is only started hurting the last few months.  We will see if Dr. Junius Roads can provide this under ultrasound with a right hip intra-articular steroid injection.  I can then see her back myself in about 4 weeks to see if this has helped.  We did have a long and thorough discussion about the possibility of hip replacement surgery in the future and I gave her handout on hip replacement surgery.

## 2019-11-10 ENCOUNTER — Ambulatory Visit: Payer: Managed Care, Other (non HMO) | Admitting: Hematology and Oncology

## 2019-11-17 ENCOUNTER — Inpatient Hospital Stay: Payer: Managed Care, Other (non HMO) | Attending: Hematology and Oncology | Admitting: Hematology and Oncology

## 2019-11-17 ENCOUNTER — Other Ambulatory Visit: Payer: Self-pay

## 2019-11-17 DIAGNOSIS — M898X9 Other specified disorders of bone, unspecified site: Secondary | ICD-10-CM | POA: Insufficient documentation

## 2019-11-17 DIAGNOSIS — M810 Age-related osteoporosis without current pathological fracture: Secondary | ICD-10-CM | POA: Insufficient documentation

## 2019-11-17 DIAGNOSIS — Z853 Personal history of malignant neoplasm of breast: Secondary | ICD-10-CM | POA: Diagnosis present

## 2019-11-17 DIAGNOSIS — Z7901 Long term (current) use of anticoagulants: Secondary | ICD-10-CM | POA: Insufficient documentation

## 2019-11-17 DIAGNOSIS — C50411 Malignant neoplasm of upper-outer quadrant of right female breast: Secondary | ICD-10-CM

## 2019-11-17 DIAGNOSIS — Z923 Personal history of irradiation: Secondary | ICD-10-CM | POA: Insufficient documentation

## 2019-11-17 DIAGNOSIS — Z9221 Personal history of antineoplastic chemotherapy: Secondary | ICD-10-CM | POA: Insufficient documentation

## 2019-11-17 DIAGNOSIS — Z171 Estrogen receptor negative status [ER-]: Secondary | ICD-10-CM

## 2019-11-17 NOTE — Progress Notes (Signed)
Patient Care Team: Wenda Low, MD as PCP - General (Internal Medicine)  DIAGNOSIS:  Encounter Diagnosis  Name Primary?  . Malignant neoplasm of upper-outer quadrant of right breast in female, estrogen receptor negative ([facility] [facility])     SUMMARY OF ONCOLOGIC HISTORY: Oncology History  Malignant neoplasm of upper-outer quadrant of right breast in female, estrogen receptor negative ([place])  08/27/2012 Initial Diagnosis   Stage 1a IDC with DCIS (T2N0M0) : ER-, PR-, HER positive 3+, Ki67 20-25%   08/27/2012 Cancer Staging   Staging form: Breast, AJCC 8th Edition - Clinical stage from 08/27/2012: Stage IIA (cT2, cN0, cM0, G3, ER-, PR-, HER2+) - Signed by Nicholas Lose, MD on 11/17/2019   09/06/2012 Breast MRI   Right breast, lateral to the nipple, 10x14x79m mass with mildly irregular borders   09/10/2012 Echocardiogram   EF 55-60%    Chemotherapy   Neoadjuvant Taxotere/Carboplatin/Herceptin/Perjeta followed by adjuvant Herceptin maintenance   01/09/2013 Breast MRI   Complete pathological response to neoadjuvant treatment: right breast mass previously noted at 9:00 position no longer identified.   02/13/2013 Surgery   Right breast lumpectomy: benign parenchyma with some stromal fibrosis consistent with previous tumor site, focal area of atypical lobular hyperplasia, all lymph nodes negative.     Radiation Therapy   Adjuvant radiation   04/06/2013 Imaging   DEXA: T-score of -1.1, osteopenia    09/25/2016 Initial Biopsy   Columnar cell alteration with atypia, foreign body granuloma     CHIEF COMPLIANT: Surveillance and follow-up of right breast cancer and history of DVTs  INTERVAL HISTORY: Sierra Perez is a 59year old above-mentioned history of right breast cancer treated with neoadjuvant chemotherapy with complete pathologic response, lumpectomy, radiation and Herceptin maintenance and is currently on surveillance.  She denies any lumps or nodules in the breast. She is also currently on  Eliquis for history of DVTs. Her major complaint today is the pubic bone pain.  This has not been going away.  When she stretches it gets worse. She continues to have mild tenderness in the right breast.  ALLERGIES:  is allergic to pollen extract, succinylcholine, and sulfa antibiotics.  MEDICATIONS:  Current Outpatient Medications  Medication Sig Dispense Refill  . albuterol (VENTOLIN HFA) 108 (90 Base) MCG/ACT inhaler SMARTSIG:1-2 Puff(s) By Mouth Every 4-6 Hours PRN    . apixaban (ELIQUIS) 5 MG TABS tablet Take 1 tablet (5 mg total) by mouth 2 (two) times daily. 180 tablet 0  . B Complex Vitamins (B COMPLEX 1 PO) vitamin B complex capsule    . Bacillus Coagulans-Inulin (PROBIOTIC) 1-250 BILLION-MG CAPS Probiotic    . Cholecalciferol (VITAMIN D-3 PO) Vitamin D3    . DULoxetine (CYMBALTA) 30 MG capsule Take 3 capsules (90 mg total) by mouth daily.    . DULoxetine (CYMBALTA) 60 MG capsule Take 60 mg by mouth daily.    . fluticasone (FLONASE) 50 MCG/ACT nasal spray Place into both nostrils daily.    .Marland Kitchengabapentin (NEURONTIN) 600 MG tablet Take 600 mg by mouth 2 (two) times daily.    .Marland Kitchenipratropium (ATROVENT) 0.03 % nasal spray Place 2 sprays into both nostrils 2 (two) times daily.    .Marland Kitchenlevothyroxine (SYNTHROID) 50 MCG tablet Take 50 mcg by mouth every morning.    . NONFORMULARY OR COMPOUNDED ITEM Vitamin E vaginal suppositories 200u/ml.  One pv three times weekly. 36 each 3  . pantoprazole (PROTONIX) 40 MG tablet Take 1 tablet (40 mg total) by mouth daily.    . valACYclovir (VALTREX) 500 MG tablet  Take 1 tablet (500 mg total) by mouth daily. 90 tablet 4   No current facility-administered medications for this visit.    PHYSICAL EXAMINATION: ECOG PERFORMANCE STATUS: 1 - Symptomatic but completely ambulatory  Vitals:   11/17/19 0824  BP: (!) 134/91  Pulse: 80  Resp: 18  Temp: (!) 97.1 F (36.2 C)  SpO2: 97%   Filed Weights   11/17/19 0824  Weight: 231 lb 4.8 oz (104.9 kg)        ASSESSMENT & PLAN:  Malignant neoplasm of upper-outer quadrant of right breast in female, estrogen receptor negative ([facility]) 08/27/2012:Stage 1a IDC with DCIS (T2N0M0) : ER-, PR-, HER positive 3+, Ki67 20-25% Neoadjuvant chemotherapy with [place] Perjetafollowed by 1 year of Herceptin maintenance 02/13/2013:Right breast lumpectomy: benign parenchyma with some stromal fibrosis consistent with previous tumor site, focal area of atypical lobular hyperplasia, all lymph nodes negative. Adjuvant radiation therapy  Current treatment:Surveillance with annual mammograms andbreast MRIs Breast exam  11/17/2019: Benign Breast MRI: 04/25/19: No evid of malignancy Bone density 10/17/2019: T score -1.2: Mild osteopenia  Pubic bone pain: Not going away.  I will get a bone scan for further evaluation in the next 2 weeks I will call her with the results. She will get a breast MRI in March 2022. Patient plans to get the right hip replacement surgery.  I provided her instructions that she will need to go on Lovenox when she stops Eliquis.  Patient wishes to come every 6 months for evaluations. We will see her back in 6 months with follow-up  Orders Placed This Encounter  Procedures  . NM Bone Scan Whole Body    Standing Status:   Future    Standing Expiration Date:   11/16/2020    Order Specific Question:   If indicated for the ordered procedure, I authorize the administration of a radiopharmaceutical per Radiology protocol    Answer:   Yes    Order Specific Question:   Is the patient pregnant?    Answer:   No    Order Specific Question:   Preferred imaging location?    Answer:   [facility]    Order Specific Question:   Release to patient    Answer:   Immediate  . MR BREAST BILATERAL W WO CONTRAST INC CAD    Standing Status:   Future    Standing Expiration Date:   11/16/2020    Order Specific Question:   If indicated for the ordered procedure, I authorize the administration of contrast media  per Radiology protocol    Answer:   Yes    Order Specific Question:   What is the patient's sedation requirement?    Answer:   No Sedation    Order Specific Question:   Does the patient have a pacemaker or implanted devices?    Answer:   No    Order Specific Question:   Preferred imaging location?    Answer:   [facility] (table limit-550lbs)    Order Specific Question:   Release to patient    Answer:   Immediate   The patient has a good understanding of the overall plan. she agrees with it. she will call with any problems that may develop before the next visit here. Total time spent: 30 mins including face to face time and time spent for planning, charting and co-ordination of care   Harriette Ohara, MD 11/17/19

## 2019-11-17 NOTE — Assessment & Plan Note (Signed)
08/27/2012:Stage 1a IDC with DCIS (T2N0M0) : ER-, PR-, HER positive 3+, Ki67 20-25% Neoadjuvant chemotherapy with [place] Perjetafollowed by 1 year of Herceptin maintenance 02/13/2013:Right breast lumpectomy: benign parenchyma with some stromal fibrosis consistent with previous tumor site, focal area of atypical lobular hyperplasia, all lymph nodes negative. Adjuvant radiation therapy  Current treatment:Surveillance with annual mammograms andbreast MRIs Breast exam  11/17/2019: Benign Breast MRI: 04/25/19: No evid of malignancy Bone density 10/17/2019: T score -1.2: Mild osteopenia  Patient wishes to come every 6 months for evaluations. We will see her back in 6 months with follow-up

## 2019-11-28 ENCOUNTER — Encounter: Payer: Self-pay | Admitting: Orthopaedic Surgery

## 2019-11-28 ENCOUNTER — Ambulatory Visit: Payer: Managed Care, Other (non HMO) | Admitting: Orthopaedic Surgery

## 2019-11-28 DIAGNOSIS — M1611 Unilateral primary osteoarthritis, right hip: Secondary | ICD-10-CM | POA: Diagnosis not present

## 2019-11-28 NOTE — Progress Notes (Signed)
HPI: Mrs. Malachy Mood returns today follow-up status post right hip intra-articular injection 10/25/2019.  She states that the injection has helped.  She states that the hip is not perfect but is manageable.  She notes certain poses with yoga causes right groin pain.  She also notes that squatting causes pain at right hip.  She has multiple questions in regards to conservative treatments and surgical intervention.  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  Again she developed RSD in the past after knee surgery.  Physical exam: Right hip good range of motion.  Ambulates without any assistive device.  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  Risk benefits surgery discussed with the patient at length.  Postop protocol discussed with patient at length.  She would like to see how long the shot helps with her hip.  She is looking is either having a right total hip arthroplasty and January 2022 or June 2020.  She will need bridging with Lovenox due to her DVT history.  She'll follow-up with [place] as needed.  Questions were encouraged and answered at length.

## 2019-12-01 ENCOUNTER — Encounter (HOSPITAL_COMMUNITY)
Admission: RE | Admit: 2019-12-01 | Discharge: 2019-12-01 | Disposition: A | Discharge status: Home / Self Care | Payer: Managed Care, Other (non HMO) | Source: Ambulatory Visit | Attending: Hematology and Oncology | Admitting: Hematology and Oncology

## 2019-12-01 ENCOUNTER — Other Ambulatory Visit: Payer: Self-pay

## 2019-12-01 ENCOUNTER — Ambulatory Visit (HOSPITAL_COMMUNITY)
Admission: RE | Admit: 2019-12-01 | Discharge: 2019-12-01 | Disposition: A | Discharge status: Home / Self Care | Payer: Managed Care, Other (non HMO) | Source: Ambulatory Visit | Attending: Hematology and Oncology | Admitting: Hematology and Oncology

## 2019-12-01 DIAGNOSIS — Z171 Estrogen receptor negative status [ER-]: Secondary | ICD-10-CM | POA: Insufficient documentation

## 2019-12-01 DIAGNOSIS — C50411 Malignant neoplasm of upper-outer quadrant of right female breast: Secondary | ICD-10-CM | POA: Diagnosis not present

## 2019-12-01 IMAGING — NM NM BONE WHOLE BODY
2 series · 2 of 2 positions shown · non-contrast
Comparison: None

Correlation: MR RIGHT hip [DATE]

CLINICAL DATA: Breast cancer, RIGHT hip and pelvic pain for 2
months

EXAM:
NUCLEAR MEDICINE WHOLE BODY BONE SCAN
TECHNIQUE: Whole body anterior and posterior images were obtained approximately
3 hours after intravenous injection of radiopharmaceutical.
RADIOPHARMACEUTICALS:  21.3 mCi [YC] MDP IV

[Series 1: wbr_bone_40 whole body · 2.66mm/px · 1 of 1 slices shown (1 of 2)]
[im 1/1]
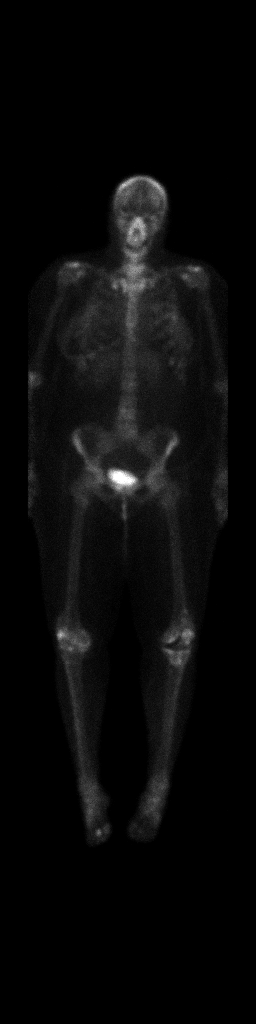

[Series 1: wbr_bone_40 whole body · 2.66mm/px · 1 of 1 slices shown (2 of 2)]
[im 1/1]
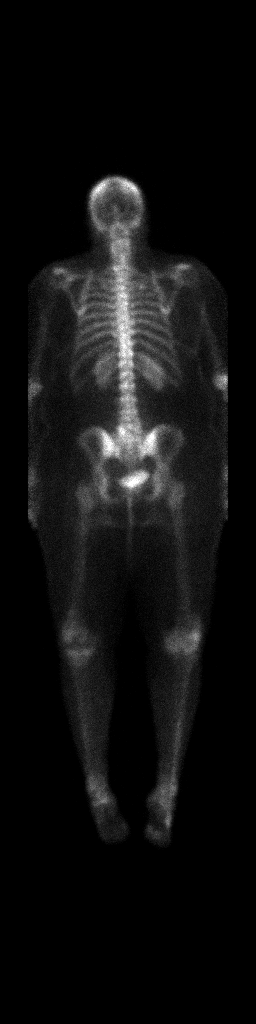

[2 of 2 positions shown; findings below may reference images not displayed]

FINDINGS: Uptake at shoulders, sternoclavicular joints, RIGHT knee, elbows,
feet, typically degenerative.

Uptake at LEFT lateral aspect of lumbar spine at L5-S1, typically
degenerative.

LEFT knee prosthesis with adjacent nonspecific tracer uptake.

Foci of increased tracer uptake at RIGHT first costochondral
junction (with shine through on posterior image) and lateral LEFT
fifth rib.

No additional sites of worrisome tracer accumulation are identified.

Expected urinary tract and soft tissue distribution of tracer.
IMPRESSION: Nonspecific increased tracer uptake at the RIGHT first costochondral
junction and at lateral LEFT approximately fifth rib; these are
nonspecific, could be due to trauma or metastasis.

No other concerning scintigraphic abnormalities.

## 2019-12-01 MED ORDER — TECHNETIUM TC 99M MEDRONATE IV KIT
21.3000 | PACK | Freq: Once | INTRAVENOUS | Status: AC
  Administered 2019-12-01: 21.3 via INTRAVENOUS

## 2019-12-04 ENCOUNTER — Telehealth: Payer: Self-pay | Admitting: Hematology and Oncology

## 2019-12-04 NOTE — Telephone Encounter (Signed)
Inform her that the bone scan did not show any evidence of metastatic disease.  Couple of areas of uptake are nonsignificant and are felt to be benign.

## 2019-12-21 ENCOUNTER — Ambulatory Visit (INDEPENDENT_AMBULATORY_CARE_PROVIDER_SITE_OTHER): Payer: Managed Care, Other (non HMO)

## 2019-12-21 ENCOUNTER — Other Ambulatory Visit: Payer: Self-pay

## 2019-12-21 ENCOUNTER — Encounter: Payer: Self-pay | Admitting: Family Medicine

## 2019-12-21 ENCOUNTER — Ambulatory Visit: Payer: Managed Care, Other (non HMO) | Admitting: Family Medicine

## 2019-12-21 DIAGNOSIS — M25561 Pain in right knee: Secondary | ICD-10-CM | POA: Diagnosis not present

## 2019-12-21 DIAGNOSIS — G8929 Other chronic pain: Secondary | ICD-10-CM

## 2019-12-21 NOTE — Progress Notes (Signed)
Office Visit Note   Patient: Sierra Perez           Date of Birth: [birth year]           MRN: [id number] Visit Date: 12/21/2019 Requested by: Wenda Low, MD [address] [place],   [address] PCP: Wenda Low, MD  Subjective: Chief Complaint  Patient presents with  . Right Knee - Pain    For 2 years now, has had intermittent issues with the patella feeling like it slides out of place. She has to pop it back in place before she can stand. Has been hurting all the time over the last several weeks - esp lateral & posterior.    HPI: She is here with right knee pain.  She has had longstanding problems with her knee and has had arthroscopic intervention a couple times.  In the past 2 years she has had intermittent episodes of a locking sensation in the knee.  Generally it is sporadic, only happening once in a while but in the past few weeks it has begun happening almost daily.  It often happens when she starts to flex her knee with the hip externally rotated.  She has to make it pop in order to straighten it again, and it has been painful most of the day after work.  The knee has been swollen as well.  She has had joint aspirated multiple times in the past.  She was told that she has arthritis in the joint.  She has not had x-rays in a while.  Her right hip is tolerable right now after injection.  She is at some point going to need hip replacement but she feels like she can go a little bit longer, although she is worried about her right knee issue if she ever has to have her hip replaced.  She has been diagnosed with pseudogout in the past.                ROS:   All other systems were reviewed and are negative.  Objective: Vital Signs: There were no vitals taken for this visit.  Physical Exam:  General:  Alert and oriented, in no acute distress. Pulm:  Breathing unlabored. Psy:  Normal mood, congruent affect. Skin: No erythema Right knee: 1-2+ effusion with no  warmth.  1+ patellofemoral crepitus, occasional patellofemoral click but it does not reproduce her pain.  No pain with patella compression or apprehension.  Ligaments feel stable.  She has tenderness on the medial and lateral joint lines, pain but no definite click with McMurray's.    Imaging: XR KNEE 3 VIEW RIGHT  Result Date: 12/21/2019 X-rays of the right knee reveal moderate to severe patellofemoral DJD with joint space narrowing and spurring.  She has chondrocalcinosis consistent with pseudogout.  Tibiofemoral joint space is still fairly good, no sign of loose body.   Assessment & Plan: 1.  Right knee pain with effusion and locking, suspicious for meniscus tear. -She would like to avoid surgery if possible due to her history of RSD.  We will aspirate and inject with steroid today.  If symptoms persist, she will contact me and I will order MRI scan.     Procedures: Right knee aspiration and injection: After sterile prep with Betadine, injected 5 cc 1% lidocaine without epinephrine, then aspirated 25 cc clear yellow synovial fluid with white particulate matter/cartilaginous material, then injected 40 mg methylprednisolone from superolateral approach.    PMFS History: Patient Active Problem  List   Diagnosis Date Noted  . Unilateral primary osteoarthritis, 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  . 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 DVT (deep venous thrombosis) ([place]) 05/04/2018  . Malignant neoplasm of upper-outer quadrant of right breast in female, estrogen receptor negative ([place]) 05/03/2018   Past Medical History:  Diagnosis Date  . Anxiety   . Breast cancer ([place]) 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, her-2 positive, ER/PR negative  . Clotting disorder ([place])    testing was negative, h/o DVT while on treatment  . Complex regional pain syndrome i of right lower limb   . DVT (deep venous thrombosis) ([place])    started in 2015, multiple  . Fibroid   . Heart murmur   . HPV in female   . HSV (herpes simplex virus)  anogenital infection    HSV 1  . IBS (irritable bowel syndrome)   . Thyroid disease     Family History  Problem Relation Age of Onset  . Hypertension Mother   . Diabetes Father   . Kidney disease Father   . Dementia Father   . Factor VIII deficiency Father   . Bladder Cancer Maternal Grandmother   . Stroke Maternal Grandmother   . Lung cancer Maternal Grandfather   . Colon cancer Maternal Grandfather   . Cancer Paternal Grandmother        "female"    Past Surgical History:  Procedure Laterality Date  . ABDOMINAL HYSTERECTOMY  2010   fibroids, h/o abnormal pap smears  . BREAST SURGERY Right 2015   right lumpectomy with reduction and lift  . DENTAL SURGERY    . FOOT SURGERY     dislocated toe  . [place] RESECTION  2014  . LAPAROSCOPIC OOPHERECTOMY Bilateral 2016  . REPLACEMENT TOTAL KNEE Left 2018   also had 3 prior arthroscopies  . VENA CAVA FILTER PLACEMENT  2017   Social History   Occupational History  . Not on file  Tobacco Use  . Smoking status: Never Smoker  . Smokeless tobacco: Never Used  Vaping Use  . Vaping Use: Never used  Substance and Sexual Activity  . Alcohol use: Yes    Comment: occ  . Drug use: Never  . Sexual activity: Yes    Birth control/protection: Surgical    Comment: Hysterectomy

## 2020-01-05 ENCOUNTER — Other Ambulatory Visit: Payer: Self-pay | Admitting: Obstetrics & Gynecology

## 2020-01-05 ENCOUNTER — Telehealth: Payer: Self-pay | Admitting: Obstetrics & Gynecology

## 2020-01-05 MED ORDER — VALACYCLOVIR HCL 500 MG PO TABS: 500.0000 mg | ORAL_TABLET | Freq: Two times a day (BID) | ORAL | 0 refills | Status: DC

## 2020-01-05 NOTE — Telephone Encounter (Signed)
Pt sent my chart message about being out of town and having HSV outbreak.  Planning on transferring care to [facility].  Would like rx sent to pharmacy in [place].  Rx for valtrex 500mg  bid x 3 days sent to pharmacy new her.  We discussed suppressive therapy.  She never started this but will plan to once back in .  Coming home this weekend.  Has rx for this already.

## 2020-01-16 ENCOUNTER — Encounter: Payer: Self-pay | Admitting: Family Medicine

## 2020-01-16 ENCOUNTER — Encounter: Payer: Self-pay | Admitting: Orthopaedic Surgery

## 2020-01-16 DIAGNOSIS — M2351 Chronic instability of knee, right knee: Secondary | ICD-10-CM

## 2020-01-16 DIAGNOSIS — M25561 Pain in right knee: Secondary | ICD-10-CM

## 2020-01-16 DIAGNOSIS — G8929 Other chronic pain: Secondary | ICD-10-CM

## 2020-02-13 ENCOUNTER — Other Ambulatory Visit: Payer: Managed Care, Other (non HMO)

## 2020-02-14 ENCOUNTER — Ambulatory Visit
Admission: RE | Admit: 2020-02-14 | Discharge: 2020-02-14 | Disposition: A | Discharge status: Home / Self Care | Payer: 59 | Source: Ambulatory Visit | Attending: Family Medicine | Admitting: Family Medicine

## 2020-02-14 ENCOUNTER — Other Ambulatory Visit: Payer: Self-pay

## 2020-02-14 ENCOUNTER — Telehealth: Payer: Self-pay | Admitting: Family Medicine

## 2020-02-14 DIAGNOSIS — G8929 Other chronic pain: Secondary | ICD-10-CM

## 2020-02-14 DIAGNOSIS — M2351 Chronic instability of knee, right knee: Secondary | ICD-10-CM

## 2020-02-14 DIAGNOSIS — M25561 Pain in right knee: Secondary | ICD-10-CM

## 2020-02-14 IMAGING — MR MR KNEE*R* W/O CM
4 of 7 series · 22 of 40 positions shown · non-contrast
Comparison: X-ray [DATE]

CLINICAL DATA: Chronic right knee pain

EXAM:
MRI OF THE RIGHT KNEE WITHOUT CONTRAST
TECHNIQUE: Multiplanar, multisequence MR imaging of the knee was performed. No
intravenous contrast was administered.

[Series 4: T1 · coronal · 4.0mm · 0.29mm/px · 4 of 26 slices shown]
[im 1/26]
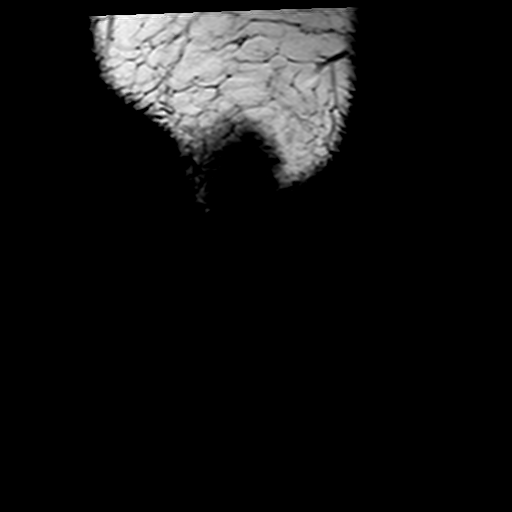
[im 6/26]
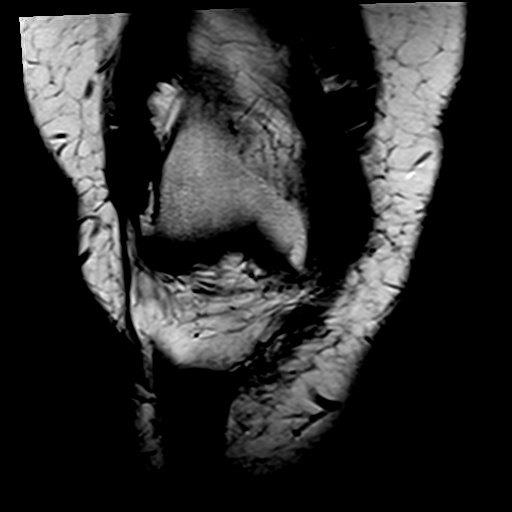
[im 16/26]
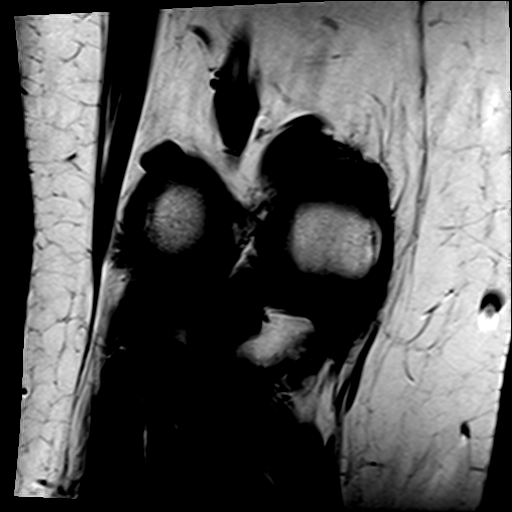
[im 26/26]
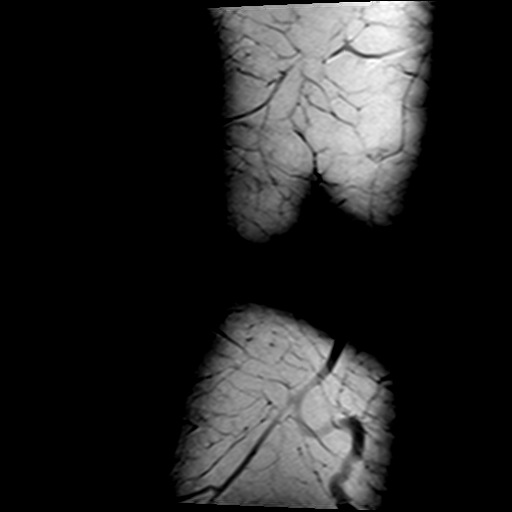

[Series 5: T2 fat-sat · coronal · 4.0mm · 0.59mm/px · 6 of 25 slices shown (1 of 2)]
[im 1/25]
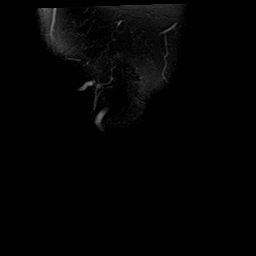
[im 5/25]
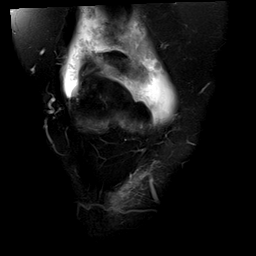
[im 10/25]
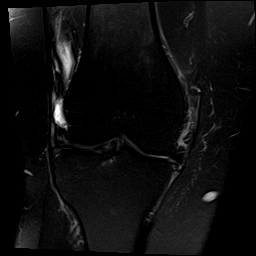
[im 15/25]
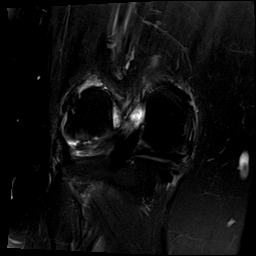
[im 20/25]
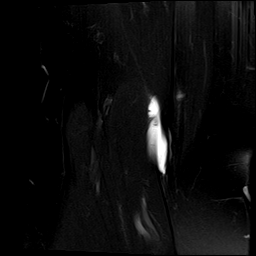
[im 25/25]
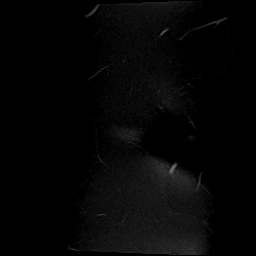

[Series 7: PD fat-sat · sagittal · 3.0mm · 0.29mm/px · 6 of 28 slices shown]
[im 1/28]
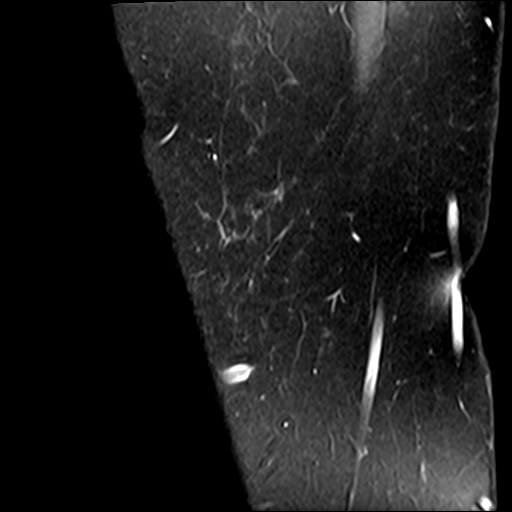
[im 6/28]
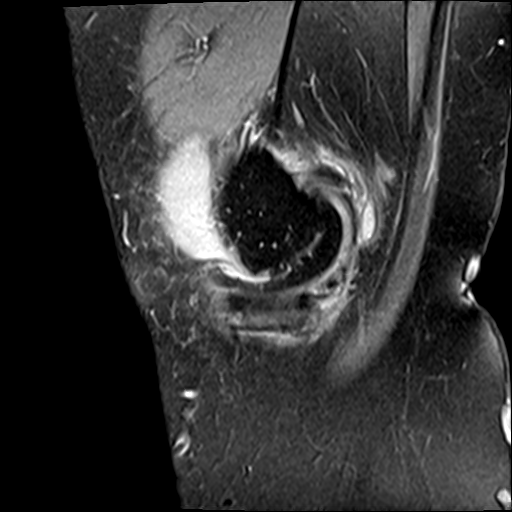
[im 11/28]
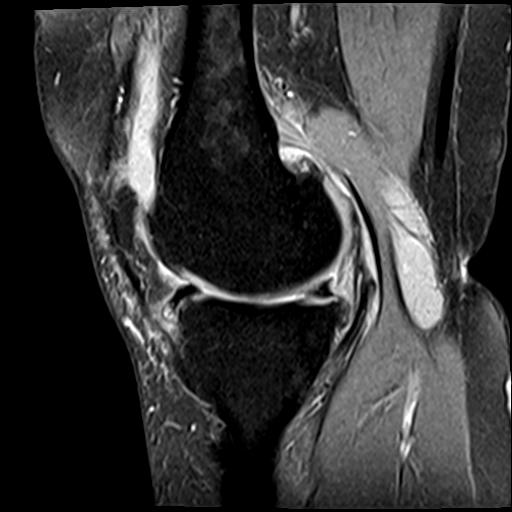
[im 17/28]
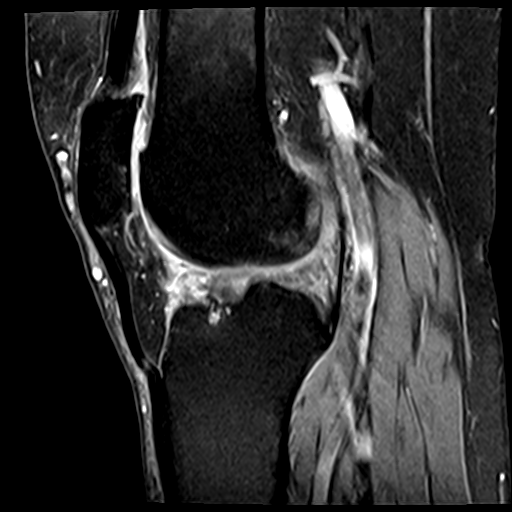
[im 22/28]
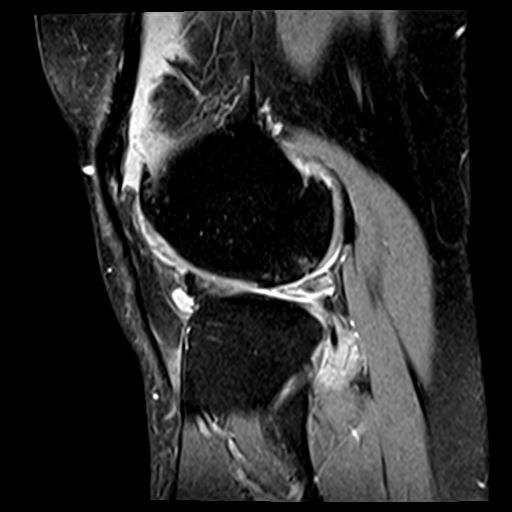
[im 28/28]
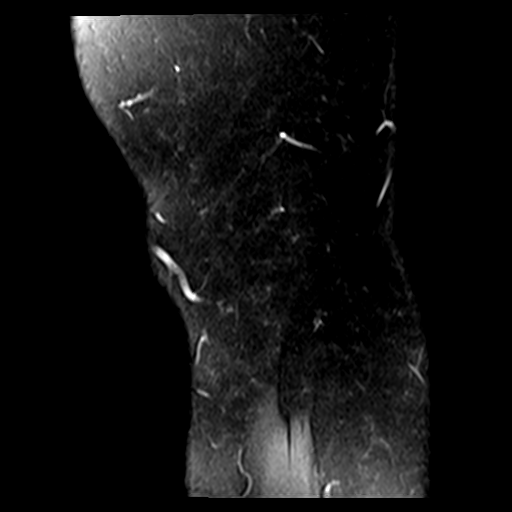

[Series 8: T2 fat-sat · sagittal · 3.0mm · 0.29mm/px · 6 of 28 slices shown (2 of 2)]
[im 1/28]
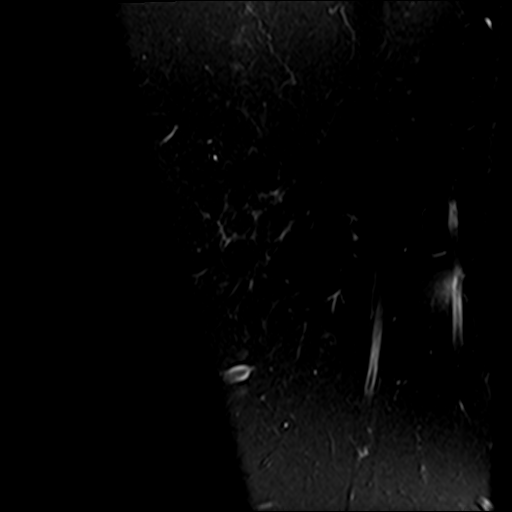
[im 6/28]
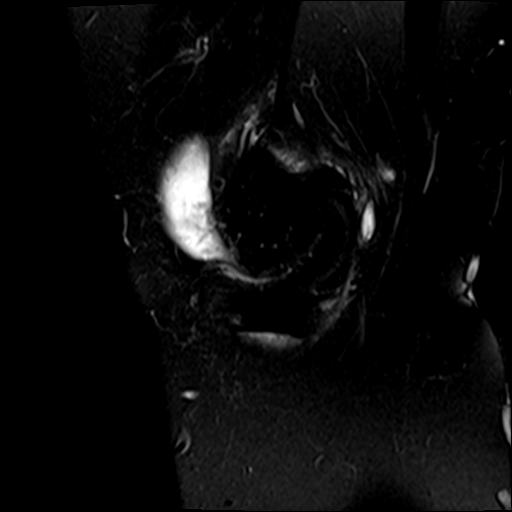
[im 11/28]
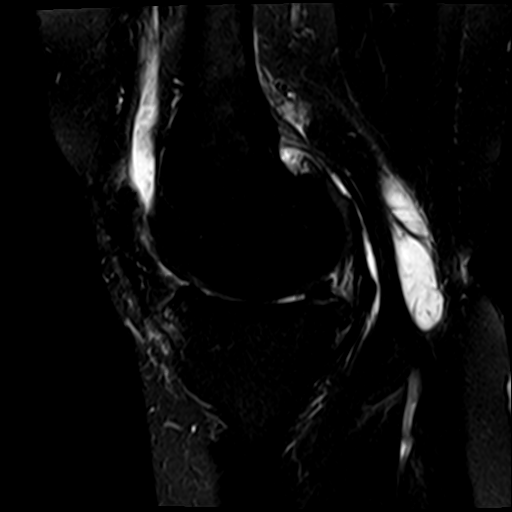
[im 17/28]
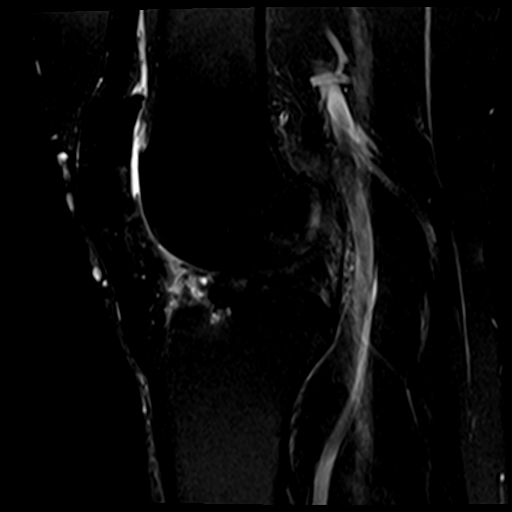
[im 22/28]
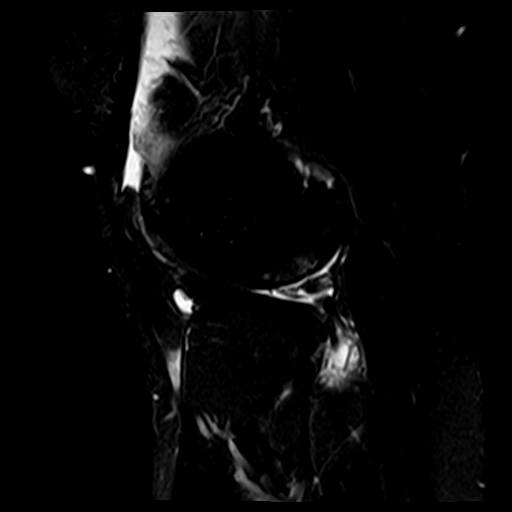
[im 28/28]
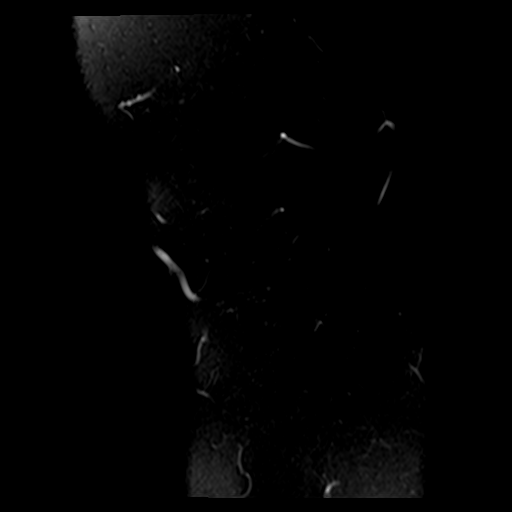

[22 of 40 positions shown; findings below may reference images not displayed]

FINDINGS: MENISCI

Medial meniscus: Extensive complex degenerative tearing of the
medial meniscal body and posterior horn. There is a small flap of
meniscal tissue displaced into the inferior gutter (series 6, image
15).

Lateral meniscus: Maceration of the lateral meniscal body and
posterior horn including a complete radial tear of the posterior
horn (series 6, image 13).

LIGAMENTS

Cruciates:  Intact ACL and PCL.

Collaterals: Medial collateral ligament is intact. Lateral
collateral ligament complex is intact.

CARTILAGE

Patellofemoral: Extensive full-thickness cartilage loss of the
lateral patellar facet and superior aspect of the lateral trochlea.
Moderate cartilage thinning and surface irregularity of the medial
patellar facet.

Medial: Mild chondral thinning of the weight-bearing medial
compartment

Lateral: Focal irregular full-thickness cartilage defect of the
central lateral femoral condyle measuring approximately 11 x 8 mm
(series 5, image 13). Cartilage thinning of the lateral tibial
plateau.

Joint: Moderate-sized joint effusion with internal heterogeneity
suggesting synovitis. Fat pads within normal limits.

Popliteal Fossa:  Small Baker's cyst.  Distal popliteus tendinosis.

Extensor Mechanism:  Intact quadriceps tendon and patellar tendon.

Bones: Tricompartmental joint space narrowing and marginal
osteophyte formation. No acute fracture. Degenerative subchondral
marrow signal changes within the lateral femoral condyle and
patella. No suspicious bone lesion.

Other: None.
IMPRESSION: 1. Tricompartmental osteoarthritis, most severe in the
patellofemoral and lateral compartments.
2. Maceration of the lateral meniscus including a complete radial
tear of the posterior horn.
3. Extensive complex degenerative tearing of the medial meniscal
body and posterior horn.
4. Moderate-sized joint effusion with internal heterogeneity
suggesting synovitis. Small Baker's cyst.

## 2020-02-14 NOTE — Telephone Encounter (Signed)
MRI shows tears of the medial and lateral meniscus cartilages.   But the main concern is severe arthritis.  When there is this much arthritis, arthroscopic surgery often will make the pain worse instead of better.  If still in a lot of pain, we can request approval for "gel" injections for your knee.  Another option would be to talk to Dr. Ninfa Linden about knee replacement.

## 2020-02-15 ENCOUNTER — Telehealth: Payer: Self-pay | Admitting: *Deleted

## 2020-02-15 NOTE — Telephone Encounter (Signed)
Eliquis Prior Authorization request not processing.  Received "Not Active" response.  Confirmed [employer] Prescription Benefit information.  Report Eliquis ready for pick-up.  Co-pay of $10.00 with co-pay assistance card continuing to be used.

## 2020-02-16 ENCOUNTER — Ambulatory Visit (HOSPITAL_COMMUNITY)
Admission: RE | Admit: 2020-02-16 | Discharge: 2020-02-16 | Disposition: A | Discharge status: Home / Self Care | Payer: 59 | Source: Ambulatory Visit | Attending: Hematology and Oncology | Admitting: Hematology and Oncology

## 2020-02-16 ENCOUNTER — Telehealth: Payer: Self-pay

## 2020-02-16 ENCOUNTER — Other Ambulatory Visit: Payer: Self-pay

## 2020-02-16 ENCOUNTER — Encounter: Payer: Self-pay | Admitting: Hematology and Oncology

## 2020-02-16 DIAGNOSIS — I82599 Chronic embolism and thrombosis of other specified deep vein of unspecified lower extremity: Secondary | ICD-10-CM

## 2020-02-16 DIAGNOSIS — M79604 Pain in right leg: Secondary | ICD-10-CM

## 2020-02-16 NOTE — Telephone Encounter (Signed)
RN notified patient of negative DVT to right leg

## 2020-02-16 NOTE — Telephone Encounter (Signed)
Pt called to report right calf tightness.    Pt with history of DVT - Pt reports recent travel with prolonged immobility.  Pt currently on Eliquis.    MD orders for Right Vascular ultrasound to r/o DVT.    Pt scheduled for STAT ultrasound today @ 1pm - Call report number given.  Pt verbalized understanding and agreement.

## 2020-02-16 NOTE — Progress Notes (Signed)
Right lower extremity venous duplex has been completed. Preliminary results can be found in CV Proc through chart review.  Results were given to Kathlee Nations at Dr. Geralyn Flash office.  02/16/20 1:15 PM Carlos Levering RVT

## 2020-02-26 ENCOUNTER — Encounter: Payer: Self-pay | Admitting: Orthopaedic Surgery

## 2020-02-26 ENCOUNTER — Telehealth: Payer: Self-pay

## 2020-02-26 ENCOUNTER — Ambulatory Visit: Payer: 59 | Admitting: Orthopaedic Surgery

## 2020-02-26 DIAGNOSIS — M1711 Unilateral primary osteoarthritis, right knee: Secondary | ICD-10-CM

## 2020-02-26 DIAGNOSIS — G8929 Other chronic pain: Secondary | ICD-10-CM | POA: Diagnosis not present

## 2020-02-26 DIAGNOSIS — M25561 Pain in right knee: Secondary | ICD-10-CM | POA: Diagnosis not present

## 2020-02-26 NOTE — Telephone Encounter (Signed)
Right knee gel injection  

## 2020-02-26 NOTE — Telephone Encounter (Signed)
Called and scheduled

## 2020-02-26 NOTE — Progress Notes (Signed)
The patient comes in today to go over the MRI of her right knee.  She has had significant recurrent effusions of that knee.  She has a history of a left total knee arthroplasty.  She does also have history of RSD with complex regional pain syndrome.  She has had multiple aspirations from this right knee and a steroid injection recently in the knee as well.  MRIs reviewed with her of her right knee.  There is areas of full-thickness cartilage loss in the lateral compartment the knee and the patellofemoral joint.  There is significant tearing of the medial lateral meniscus.  The cruciate ligaments are intact.  Her knee does have a moderate effusion and I was able to aspirate fluid from her knee today.  At this point I am recommending a knee replacement for that knee.  She is on Eliquis and does have a [place] filter from a history of previous blood clots.  We would have her stop the Eliquis 3 days before surgery and then resume it the day after surgery.  I am recommending knee replacement.  I gave her surgery scheduler's card to consider this as well.  All questions and concerns were answered and addressed.  I was able to take 50 cc of fluid off of her right knee.  She has already tried and failed steroid injections.  She has never had hyaluronic acid for this right knee.  I do feel that is the next verbal step of trying a gel injection for the right knee.  We will order this for her and call her once this has been approved by insurance and we can get this in for an injection for her right knee.  She agrees with this treatment plan.

## 2020-02-26 NOTE — Telephone Encounter (Signed)
Submitted for VOB for Durolane-Right knee 

## 2020-02-26 NOTE — Telephone Encounter (Signed)
Approved for Durolane-Right knee Dr. Margarito Liner and Bill $50 copay 20% OOP No prior auth required    New start- ok to schedule @ next available

## 2020-03-01 ENCOUNTER — Ambulatory Visit: Payer: 59 | Admitting: Family Medicine

## 2020-03-06 ENCOUNTER — Ambulatory Visit: Payer: 59 | Admitting: Orthopaedic Surgery

## 2020-03-06 ENCOUNTER — Encounter: Payer: Self-pay | Admitting: Orthopaedic Surgery

## 2020-03-06 DIAGNOSIS — M1711 Unilateral primary osteoarthritis, right knee: Secondary | ICD-10-CM | POA: Diagnosis not present

## 2020-03-06 MED ORDER — SODIUM HYALURONATE 60 MG/3ML IX PRSY
60.0000 mg | PREFILLED_SYRINGE | INTRA_ARTICULAR | Status: AC | PRN
  Administered 2020-03-06: 60 mg via INTRA_ARTICULAR

## 2020-03-06 MED ORDER — LIDOCAINE HCL 1 % IJ SOLN
3.0000 mL | INTRAMUSCULAR | Status: AC | PRN
  Administered 2020-03-06: 3 mL

## 2020-03-06 NOTE — Progress Notes (Signed)
   Procedure Note  Patient: Sierra Perez             Date of Birth: 11/08/1960           MRN: [id number]             Visit Date: 03/06/2020  HPI: Mrs. Harold returns today for Durolane injection right knee.  She continues to have pain in the knee has good and bad days.  At times the knee feels unsteady and she is unable to really stand on it.  She also has a history of RSD complex regional pain syndrome in the right knee status post knee arthroscopy and is definitely contributes to the pain in the knee.  She had no new injury.  She has known full-thickness cartilage loss in the lateral compartment knee and patellofemoral joint.  Physical exam: Right knee no abnormal warmth erythema good range of motion.  Positive effusion.  Procedures: Visit Diagnoses:  1. Unilateral primary osteoarthritis, right knee     Large Joint Inj: R knee on 03/06/2020 9:21 AM Indications: pain Details: 22 G 1.5 in needle, anterolateral approach  Arthrogram: No  Medications: 3 mL lidocaine 1 %; 60 mg Sodium Hyaluronate 60 MG/3ML Aspirate: 23 mL yellow Outcome: tolerated well, no immediate complications Procedure, treatment alternatives, risks and benefits explained, specific risks discussed. Consent was given by the patient. Immediately prior to procedure a time out was called to verify the correct patient, procedure, equipment, support staff and site/side marked as required. Patient was prepped and draped in the usual sterile fashion.     Plan: She will follow up with [place] in 8 weeks to see what type of response she had to the injection.  Knee was wrapped with an Ace bandage which she will remove this evening.  Questions were encouraged and answered at length today.

## 2020-03-07 ENCOUNTER — Ambulatory Visit: Payer: 59 | Admitting: Nurse Practitioner

## 2020-03-07 ENCOUNTER — Other Ambulatory Visit: Payer: Self-pay

## 2020-03-07 ENCOUNTER — Encounter: Payer: Self-pay | Admitting: Nurse Practitioner

## 2020-03-07 VITALS — BP 118/72 | HR 68 | Resp 16 | Wt 238.0 lb

## 2020-03-07 DIAGNOSIS — N949 Unspecified condition associated with female genital organs and menstrual cycle: Secondary | ICD-10-CM | POA: Diagnosis not present

## 2020-03-07 DIAGNOSIS — R300 Dysuria: Secondary | ICD-10-CM | POA: Diagnosis not present

## 2020-03-07 DIAGNOSIS — B009 Herpesviral infection, unspecified: Secondary | ICD-10-CM | POA: Diagnosis not present

## 2020-03-07 LAB — WET PREP FOR TRICH, YEAST, CLUE

## 2020-03-07 MED ORDER — VALACYCLOVIR HCL 500 MG PO TABS: 500.0000 mg | ORAL_TABLET | Freq: Two times a day (BID) | ORAL | 6 refills | Status: DC

## 2020-03-07 NOTE — Progress Notes (Signed)
GYNECOLOGY  VISIT  CC:   Vaginal burning  HPI: 60 y.o. G41P1001 Single White or Caucasian female here for vaginal burning.   Feels like something is not right, not itching but feels irritated Has been taking Valtrex for HSV 1, but running out so taking every other day. Not sexually active  GYNECOLOGIC HISTORY: No LMP recorded. Patient has had a hysterectomy./ BSO Contraception: hysterectomy Menopausal hormone therapy: none  Patient Active Problem List   Diagnosis Date Noted  . Unilateral primary osteoarthritis, right knee 02/26/2020  . Unilateral primary osteoarthritis, 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  . 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 DVT (deep venous thrombosis) ([place]) 05/04/2018  . Malignant neoplasm of upper-outer quadrant of right breast in female, estrogen receptor negative ([place]) 05/03/2018    Past Medical History:  Diagnosis Date  . Anxiety   . 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 Clotting disorder ([place])    testing was negative, h/o DVT while on treatment  . Complex regional pain syndrome i of right lower limb   . DVT (deep venous thrombosis) ([place])    started in 2015, multiple  . Fibroid   . Heart murmur   . HPV in female   . 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 IBS (irritable bowel syndrome)   . Thyroid disease     Past Surgical History:  Procedure Laterality Date  . ABDOMINAL HYSTERECTOMY  2010   fibroids, h/o abnormal pap smears  . BREAST SURGERY Right 2015   right lumpectomy with reduction and lift  . DENTAL SURGERY    . FOOT SURGERY     dislocated toe  . [place] RESECTION  2014  . LAPAROSCOPIC OOPHERECTOMY Bilateral 2016  . REPLACEMENT TOTAL KNEE Left 2018   also had 3 prior arthroscopies  . VENA CAVA FILTER PLACEMENT  2017    MEDS:   Current Outpatient Medications on File Prior to Visit  Medication Sig Dispense Refill  . albuterol (VENTOLIN HFA) 108 (90 Base) MCG/ACT  inhaler SMARTSIG:1-2 Puff(s) By Mouth Every 4-6 Hours PRN    . apixaban (ELIQUIS) 5 MG TABS tablet Take 1 tablet (5 mg total) by mouth 2 (two) times daily. 180 tablet 0  . B Complex Vitamins (B COMPLEX 1 PO) vitamin B complex capsule    . Bacillus Coagulans-Inulin (PROBIOTIC) 1-250 BILLION-MG CAPS Probiotic    . Cholecalciferol (VITAMIN D-3 PO) Vitamin D3    . DULoxetine (CYMBALTA) 30 MG capsule Take 3 capsules (90 mg total) by mouth daily.    . DULoxetine (CYMBALTA) 60 MG capsule Take 60 mg by mouth daily.    . fluticasone (FLONASE) 50 MCG/ACT nasal spray Place into both nostrils daily.    [name] gabapentin (NEURONTIN) 600 MG tablet Take 600 mg by mouth 2 (two) times daily.    [name] ipratropium (ATROVENT) 0.03 % nasal spray Place 2 sprays into both nostrils 2 (two) times daily.    [name] levothyroxine (SYNTHROID) 50 MCG tablet Take 50 mcg by mouth every morning.    . pantoprazole (PROTONIX) 40 MG tablet Take 1 tablet (40 mg total) by mouth daily.    . valACYclovir (VALTREX) 500 MG tablet Take 1 tablet (500 mg total) by mouth daily. 90 tablet 4   No current facility-administered medications on file prior to visit.    ALLERGIES: Pollen extract, Succinylcholine, and Sulfa antibiotics  Family History  Problem Relation Age of Onset  . Hypertension Mother   .  Diabetes Father   . Kidney disease Father   . Dementia Father   . Factor VIII deficiency Father   . Bladder Cancer Maternal Grandmother   . Stroke Maternal Grandmother   . Lung cancer Maternal Grandfather   . Colon cancer Maternal Grandfather   . Cancer Paternal Grandmother        "female"      Review of Systems  Constitutional: Negative.   HENT: Negative.   Eyes: Negative.   Respiratory: Negative.   Cardiovascular: Negative.   Gastrointestinal: Negative.   Endocrine: Negative.   Genitourinary:       Vaginal burning  Musculoskeletal: Negative.   Skin: Negative.   Allergic/Immunologic: Negative.   Neurological: Negative.    Hematological: Negative.   Psychiatric/Behavioral: Negative.     PHYSICAL EXAMINATION:    BP 118/72   Pulse 68   Resp 16   Wt 238 lb (108 kg)   BMI 36.19 kg/m     General appearance: alert, cooperative, no acute distress  Lymph:  no inguinal LAD noted  Pelvic: External genitalia:  no lesions              Urethra:  normal appearing urethra with no masses, tenderness or lesions              Bartholins and Skenes: normal                 Vagina: normal appearing vagina        Chaperone, Joy, CMA, was present for exam.  Assessment/Plan: GSM- encouraged use of OTC products, discussed  HSV (herpes simplex virus) infection - Plan: valACYclovir (VALTREX) 500 MG tablet, WET PREP FOR TRICH, YEAST, CLUE  Vaginal burning - Plan: WET PREP FOR TRICH, YEAST, CLUE  Valtrex refilled per patient request, advised no lesions seen today  When patient notified that wet mount normal, she suggested we test her urine because she noted that she has burning with urination too, mostly when urine stream is initiated, then it improves. Unable to give much urine, only enough for a culture. Culture sent.

## 2020-03-07 NOTE — Patient Instructions (Signed)
Genitourinary Syndrome of Menopause (GSM)  Genitourinary Syndrome of menopause is a term that describes the spectrum of changes caused by the lack of estrogen in menopause. Common Signs and Symptoms Include: Vaginal dryness, irritation/burning/itching. Abnormal discharge, vaginal pressure, pain with sex, decreased sexual arousal/desire, difficulty achieving orgasm, pain with urination, urgency, incontinence of urine, recurrent bladder infections, urethral prolapse and others. Diagnosis can usually be made with history and pelvic exam. Other testing may be considered to rule out other potential abnormalities. Symptoms can be progressive and chronic. The goal of treatment is symptom relief.  There are several different approaches to improving symptoms:  Vaginal Moisturizers and lubricants: Glycerin Moisturizer (Replens), Silicone based products (Uberlube), water-based products (Restore Moisturizing Gel by [employer]), Hyaluronic Acid vaginal products (such as HYALO GYN), and natural oils such as Olive Oil, Almond Oil and Coconut Oil (Since coconut oil comes in solid preparations, you can form them into suppositories and insert into the vagina as needed). These products are Over-the-Counter (OTC) at Pharmacies and retail stores and DO NOT contain hormones.  How to use vaginal and vulvar moisturizers . Many vaginal moisturizers come with an applicator. You will need fill the applicator with the moisturizer and then insert it carefully into your vagina. You can put lubricant on the tip of the applicator to make it easier to insert into your vagina. . You can also use vaginal moisturizers on your vulvar tissues, including your inner and outer labia (the folds of skin around your vagina). To put these moisturizers on your vulva, put a small amount (pea or grape size) of moisturizer on your finger. Then, massage the moisturizer into your vaginal opening and onto your labia. . If you recently finished  cancer treatment, or are going through sudden menopause, you may need to use the moisturizers 3 to 5 times a week to relieve your symptoms. . Vaginal and vulvar moisturizers should be used before you go to bed, so the product can be fully absorbed.  Lifestyle modifications: smoking cessation, pelvic floor physical therapy, Kegel's exercises, vaginal dilators  Non-hormonal therapies: Lidocaine (topical anesthetic) to decrease sensitivity, Oral Ospemifeme (requires prescription), Laser therapy (Pros and Cons should be discussed with provider)  Hormones Therapies: For moderate to severe GSM, vaginal estrogen is considered the most effective treatment. It can be used in combination with vaginal moisturizers and lubricants. Estrogen is delivered in small doses in the vagina with various preparations available including creams, rings, and tablets. Estrogen therapy may be contraindicated with certain hormone sensitive cancers. Vaginal DHEA and Testosterone have shown effectiveness in some cases to help with relieving vaginal atrophy and have shown mixed results with libido. Risks and benefits should be discussed prior to starting therapy.  

## 2020-03-08 LAB — URINE CULTURE
MICRO NUMBER:: 11491781
Result:: NO GROWTH
SPECIMEN QUALITY:: ADEQUATE

## 2020-03-17 ENCOUNTER — Other Ambulatory Visit: Payer: Self-pay | Admitting: Hematology and Oncology

## 2020-03-28 ENCOUNTER — Telehealth: Payer: Self-pay

## 2020-03-28 NOTE — Telephone Encounter (Signed)
I am okay with putting her on the schedule then for a total knee arthroplasty.  We did place a gel injection in her knee earlier this month.  We would need to see her back sometime in the next 4 weeks so we can document that she continues to have pain and arthritis in that knee and that the gel shot has not worked.  This is likely for insurance approval purposes for her knee replacement.  Based on her exam and her MRI, I do feel that a knee replacement is warranted.  She does need a detailed office visit.

## 2020-03-28 NOTE — Telephone Encounter (Signed)
Patient called wanting to schedule TKA for 07/09/20.  Okay to schedule?  Need order.  [phone]

## 2020-04-03 ENCOUNTER — Telehealth: Payer: Self-pay | Admitting: Hematology and Oncology

## 2020-04-03 NOTE — Telephone Encounter (Signed)
Rescheduled from 4/15, called pt and left a msg

## 2020-04-04 NOTE — Telephone Encounter (Signed)
She will be staying overnight for sure.  She can be admitted as obvious but will be switched to inpatient admission and I can find reasons to do that if needed.

## 2020-04-04 NOTE — Telephone Encounter (Signed)
I called patient and confirmed surgery for 07/09/20.  Will she be obs or admitted?  She stated that she has history of DVT and a nerve disorder and didn't know what you had decided in regards to that.  She has an office follow up on 05/01/20.

## 2020-04-10 ENCOUNTER — Telehealth: Payer: Self-pay | Admitting: Orthopaedic Surgery

## 2020-04-10 ENCOUNTER — Ambulatory Visit: Payer: 59 | Admitting: Orthopaedic Surgery

## 2020-04-10 ENCOUNTER — Encounter: Payer: Self-pay | Admitting: Orthopaedic Surgery

## 2020-04-10 DIAGNOSIS — M1711 Unilateral primary osteoarthritis, right knee: Secondary | ICD-10-CM

## 2020-04-10 DIAGNOSIS — M25461 Effusion, right knee: Secondary | ICD-10-CM

## 2020-04-10 DIAGNOSIS — G8929 Other chronic pain: Secondary | ICD-10-CM | POA: Diagnosis not present

## 2020-04-10 DIAGNOSIS — M25561 Pain in right knee: Secondary | ICD-10-CM | POA: Diagnosis not present

## 2020-04-10 NOTE — Telephone Encounter (Signed)
He said that's fine to make her an appt. May have to be with Artis Delay

## 2020-04-10 NOTE — Telephone Encounter (Signed)
Pt called stating she got a gel injection a few weeks ago and was told Dr.Blackman wouldn't be able to drain her knee for a while so the pt would like to know if she can have her knee drained now? Pt asked for a CB from [place]

## 2020-04-10 NOTE — Telephone Encounter (Signed)
That will be fine. 

## 2020-04-10 NOTE — Progress Notes (Signed)
The patient is well-known to me.  She has severe arthritis in her right knee with areas of full-thickness cartilage loss.  We have tried conservative treatment for her.  She does have a remote history of a left total knee arthroplasty was done elsewhere.  At this point we did place a hyaluronic acid injection in her knee about 6 weeks ago.  She is not had any relief from that injection and has a large joint effusion on the right knee today.  I was able to aspirate about 60 cc of yellow fluid from the knee today.  I did place a steroid injection in her right knee as well.  She is actually scheduled for a right total knee arthroplasty on June 7.  I agree with proceeding with this based on her clinical exam findings combined with plain film and MRI findings of the right knee.  She has had numerous steroid injections and numerous aspirations for large effusions with her right knee.  Her pain is daily with the right knee and is definitely affecting her mobility, her quality of life and her actives daily living.  She is on Eliquis.  She will take her last Eliquis orally on the Friday morning before surgery.  We had a long and thorough discussion about what to expect from an intraoperative and postoperative standpoint.  Again, given the fact that she has exhausted all conservative treatment measures, we are proceeding with knee replacement surgery in June.  Between now and then if she develops a large knee joint effusion she can easily be worked in and we can aspirate her knee but would not put a steroid injection in it at that point before surgery.  All questions and concerns were answered and addressed.

## 2020-04-20 ENCOUNTER — Other Ambulatory Visit: Payer: 59

## 2020-04-25 ENCOUNTER — Other Ambulatory Visit: Payer: Self-pay

## 2020-04-30 ENCOUNTER — Telehealth: Payer: Self-pay | Admitting: Orthopaedic Surgery

## 2020-04-30 NOTE — Telephone Encounter (Signed)
Patient submitted 2 medical release form, FMLA forms, Short term disability forms, and $50.00 check payment to [employer]. Accepted 04/30/20

## 2020-05-01 ENCOUNTER — Ambulatory Visit: Payer: 59 | Admitting: Orthopaedic Surgery

## 2020-05-01 ENCOUNTER — Other Ambulatory Visit: Payer: Self-pay

## 2020-05-01 ENCOUNTER — Ambulatory Visit
Admission: RE | Admit: 2020-05-01 | Discharge: 2020-05-01 | Disposition: A | Discharge status: Home / Self Care | Payer: 59 | Source: Ambulatory Visit | Attending: Hematology and Oncology | Admitting: Hematology and Oncology

## 2020-05-01 DIAGNOSIS — C50411 Malignant neoplasm of upper-outer quadrant of right female breast: Secondary | ICD-10-CM

## 2020-05-01 DIAGNOSIS — Z171 Estrogen receptor negative status [ER-]: Secondary | ICD-10-CM

## 2020-05-01 IMAGING — MR MR BREAST BILAT WO/W CM
8 of 12 series · 31 of 48 positions shown · IV contrast (gadavist)
Comparison: Previous exam(s).

CLINICAL DATA: Personal history of breast cancer in [W0] treated
with chemotherapy and radiation. Greater than 20% lifetime risk of
breast cancer.

LABS:  None
EXAM:
BILATERAL BREAST MRI WITH AND WITHOUT CONTRAST
TECHNIQUE: Multiplanar, multisequence MR images of both breasts were obtained
prior to and following the intravenous administration of 10 ml of
Gadavist

[Series 2: t2_tirm_tra ipat (a-p) · axial · 3.0mm · 0.74mm/px · 1 of 55 slices shown]
[im 1/55]
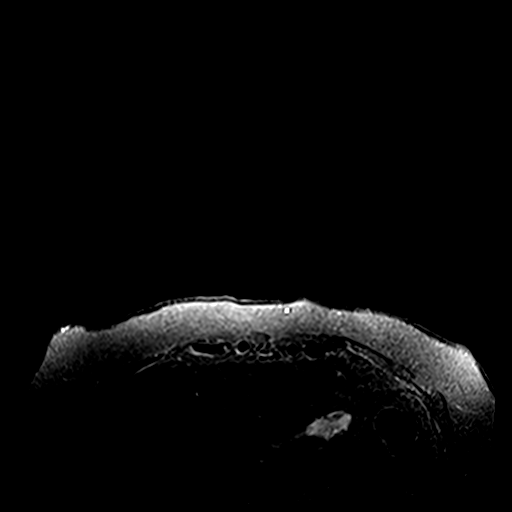

[Series 3: fl3d pre-cm no · axial · non-contrast · 1.2mm · 0.99mm/px · z∈[-115,+56]mm · 5 of 144 slices shown]
[im 1/144]
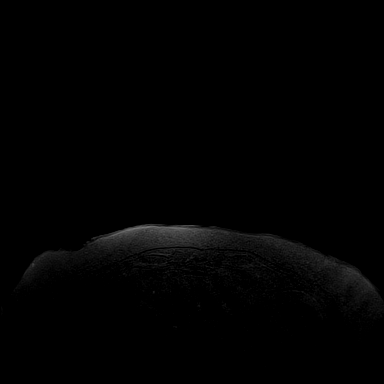
[im 36/144]
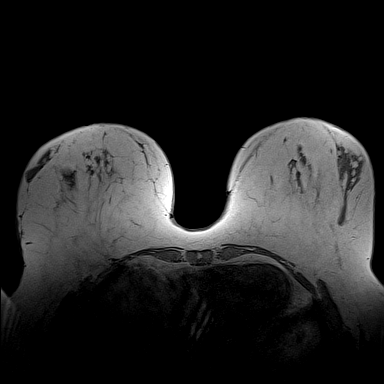
[im 72/144]
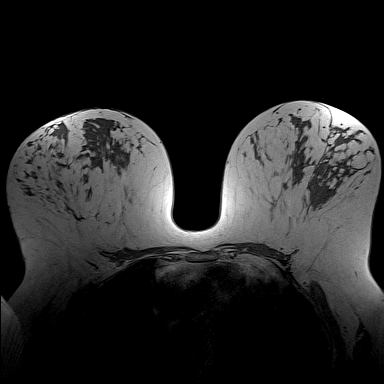
[im 108/144]
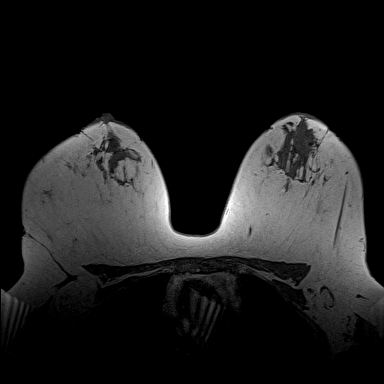
[im 144/144]
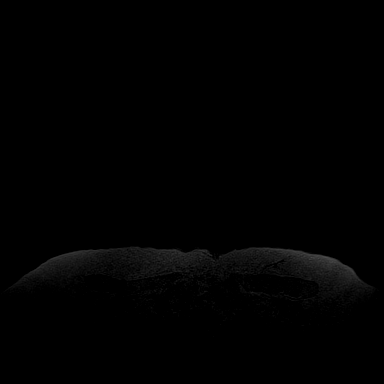

[Series 4: fl3d pre-cm · axial · non-contrast · 1.2mm · 0.99mm/px · z∈[-115,+56]mm · 5 of 144 slices shown]
[im 1/144]
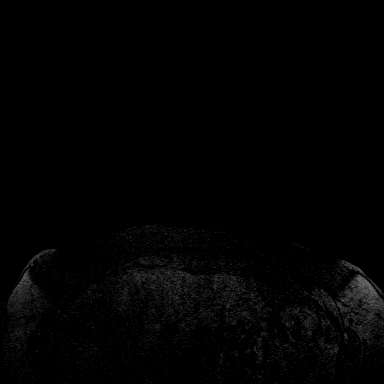
[im 36/144]
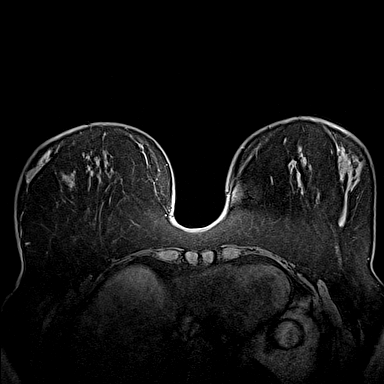
[im 72/144]
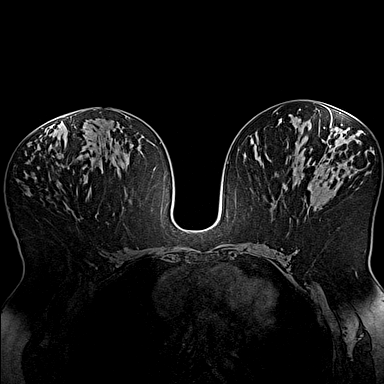
[im 108/144]
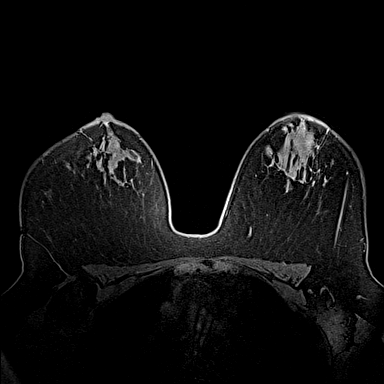
[im 144/144]
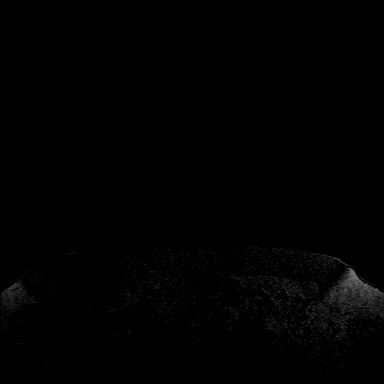

[Series 5: fl3d post-cm 20 · axial · 1.2mm · 0.99mm/px · z∈[-115,+56]mm · 5 of 144 slices shown (1 of 3)]
[im 1/144]
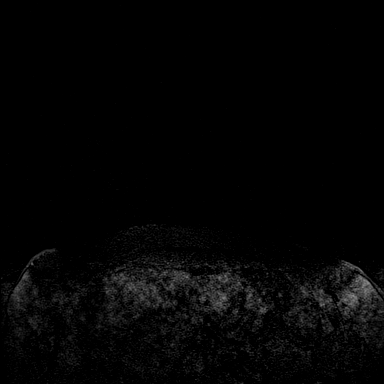
[im 36/144]
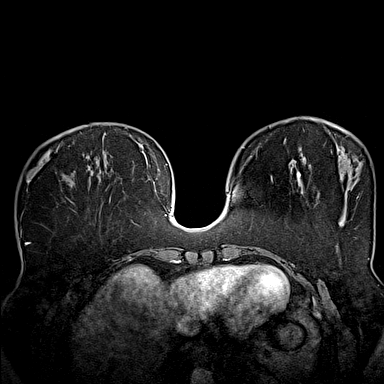
[im 72/144]
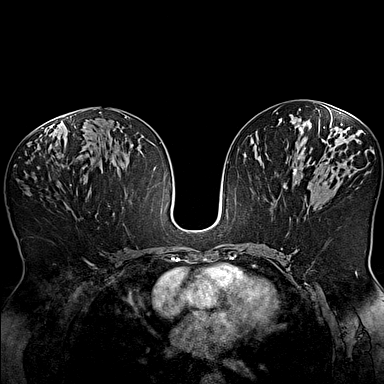
[im 108/144]
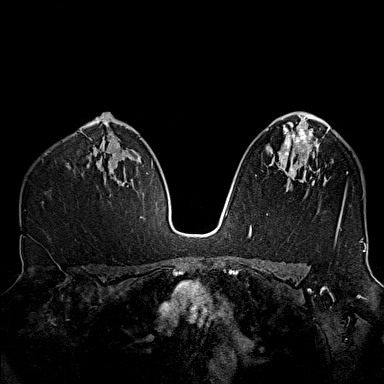
[im 144/144]
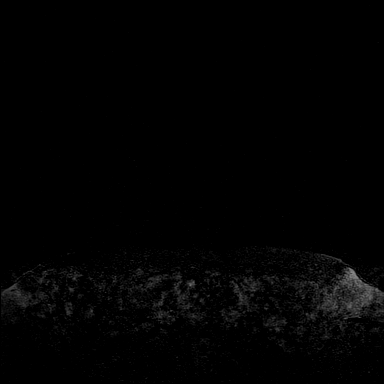

[Series 6: fl3d post-cm 20 · axial · 1.2mm · 0.99mm/px · z∈[-115,+56]mm · 5 of 144 slices shown (2 of 3)]
[im 1/144]
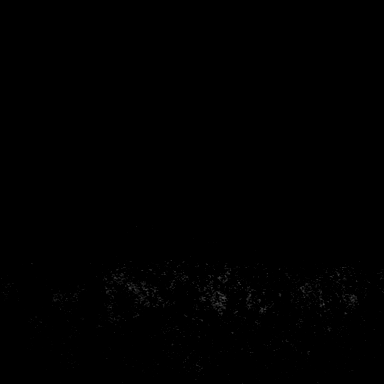
[im 36/144]
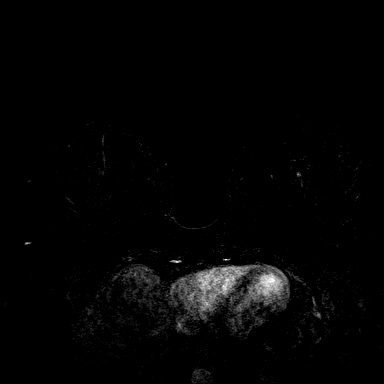
[im 72/144]
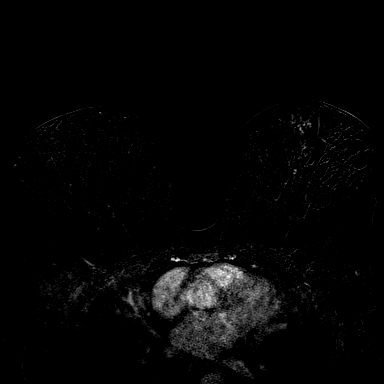
[im 108/144]
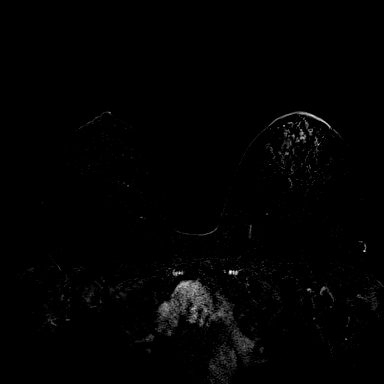
[im 144/144]
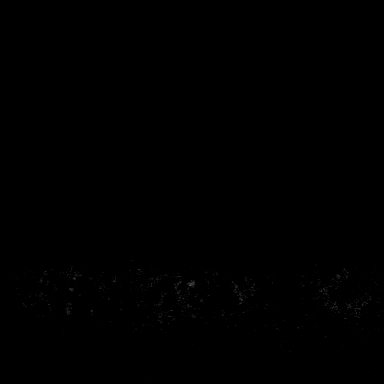

[Series 7: fl3d post-cm 20 · axial · 172.8mm · 0.99mm/px · 1 of 1 slices shown (3 of 3)]
[im 1/1]
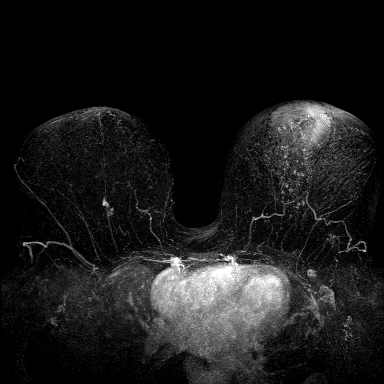

[Series 8: fl3d post-cm 3min · axial · 1.2mm · 0.99mm/px · z∈[-115,+56]mm · 6 of 144 slices shown]
[im 1/144]
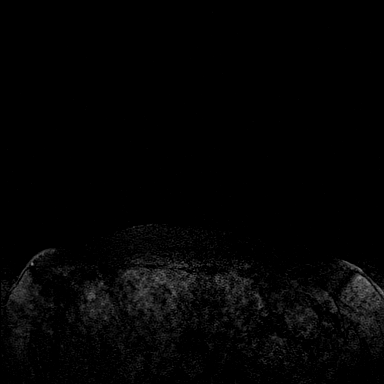
[im 29/144]
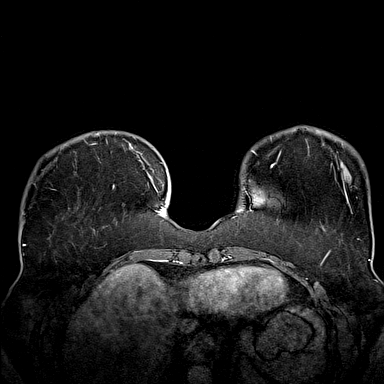
[im 58/144]
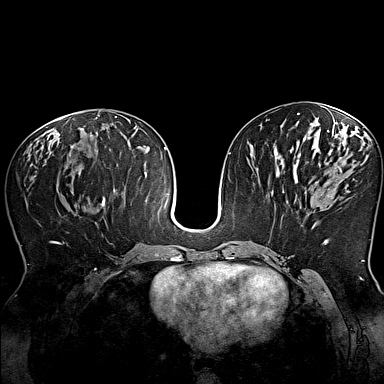
[im 86/144]
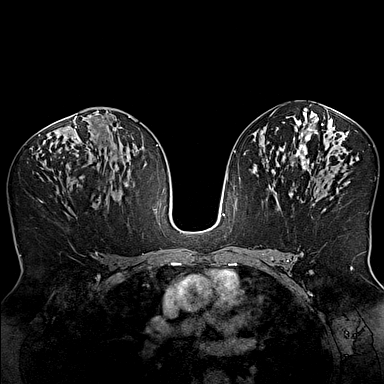
[im 115/144]
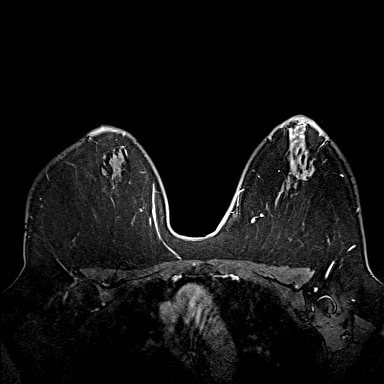
[im 144/144]
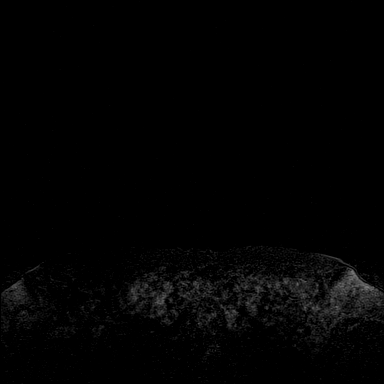

[Series 9: fl3d post-cm 3min_sub · axial · 1.2mm · 0.99mm/px · z∈[-115,-47]mm · 3 of 144 slices shown]
[im 1/144]
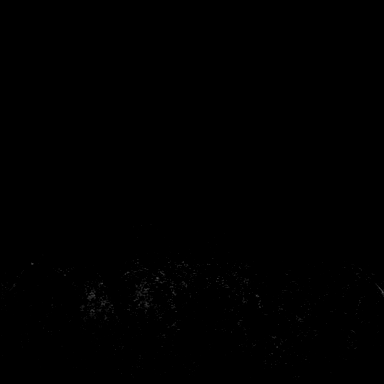
[im 29/144]
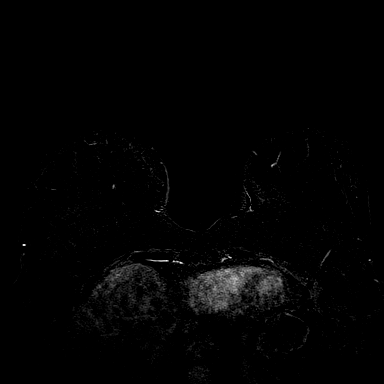
[im 58/144]
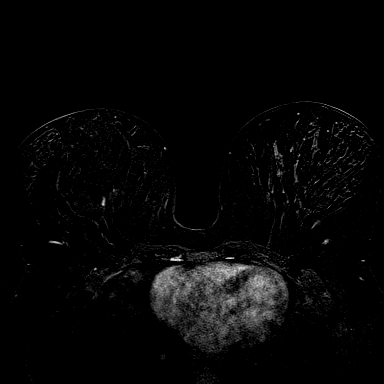

[31 of 48 positions shown; findings below may reference images not displayed]

Three-dimensional MR images were rendered by post-processing of the
original MR data on an independent workstation. The
three-dimensional MR images were interpreted, and findings are
reported in the following complete MRI report for this study. Three
dimensional images were evaluated at the independent interpreting
workstation using the DynaCAD thin client.
FINDINGS: Breast composition: c. Heterogeneous fibroglandular tissue.

Background parenchymal enhancement: Moderate.

Right breast: There is linear non mass enhancement at approximately
6 o'clock in the right breast seen on images 79 through 87 on series
12 spanning 2.1 cm in AP dimension. No other abnormal enhancement or
suspicious finding identified in the right breast. Post lumpectomy
changes.

Left breast: There is significant non mass enhancement in the
central left breast extending from the nipple posteriorly into the
posterior third of the breast. This non mass enhancement is
asymmetric to the right and was not present previously. The AP
dimension is at least 10.5 cm. The transverse dimension is at least
4.3 cm. The craniocaudal dimension is at least 9.4 cm. There appears
to be more enhancement of the left nipple areolar complex compared
to the right with possible mild skin thickening associated with the
left areola.

Lymph nodes: No abnormal appearing lymph nodes.

Ancillary findings:  None.
IMPRESSION: 1. There is new linear non mass enhancement at approximately 6
o'clock in the right breast spanning 2.1 cm in AP dimension on
series 12, images 79-87.
2. There is significant non mass enhancement centrally in the left
breast extending from the nipple posteriorly into the posterior
third of the breast spanning at least 10.5 x 4.3 x 9.4 cm in AP,
transverse, and craniocaudal dimensions.
3. There is more enhancement of the left areolar nipple complex
compared to the right with possible mild skin thickening associated
with the areola. Recommend clinical correlation.

RECOMMENDATION:
1. Recommend biopsy of the new linear enhancement in the right
breast seen on series 12, images 79-87.
2. Recommend biopsy of the anterior extent of the new central non
mass enhancement in the left breast. Series 12, images 35 or 38
demonstrate a good target.
3. Recommend biopsy of the posterior extent of the non mass
enhancement in the left breast. Recommend biopsying the posterior
non mass enhancement seen on series 12, image 81.
4. Recommend clinical correlation of the left nipple areolar complex
given the asymmetric enhancement and possible skin thickening.

BI-RADS CATEGORY  4: Suspicious.

## 2020-05-01 MED ORDER — GADOBUTROL 1 MMOL/ML IV SOLN
10.0000 mL | Freq: Once | INTRAVENOUS | Status: AC | PRN
  Administered 2020-05-01: 10 mL via INTRAVENOUS

## 2020-05-02 ENCOUNTER — Encounter: Payer: Self-pay | Admitting: Hematology and Oncology

## 2020-05-03 ENCOUNTER — Other Ambulatory Visit: Payer: Self-pay | Admitting: Hematology and Oncology

## 2020-05-03 ENCOUNTER — Telehealth: Payer: Self-pay | Admitting: Hematology and Oncology

## 2020-05-03 DIAGNOSIS — R9389 Abnormal findings on diagnostic imaging of other specified body structures: Secondary | ICD-10-CM

## 2020-05-03 NOTE — Telephone Encounter (Signed)
I left a voice message on her cell phone that we are going to need MRI guided biopsies. She is out of town and most likely will reach back on Monday night.  She will call me after that to discuss if she has any questions. I would like to see her after the biopsy is done to go over the results.  We can do this is a virtual visit.

## 2020-05-10 ENCOUNTER — Other Ambulatory Visit (HOSPITAL_COMMUNITY): Payer: Self-pay | Admitting: Diagnostic Radiology

## 2020-05-10 ENCOUNTER — Other Ambulatory Visit: Payer: Self-pay

## 2020-05-10 ENCOUNTER — Ambulatory Visit
Admission: RE | Admit: 2020-05-10 | Discharge: 2020-05-10 | Disposition: A | Discharge status: Home / Self Care | Payer: 59 | Source: Ambulatory Visit | Attending: Hematology and Oncology | Admitting: Hematology and Oncology

## 2020-05-10 DIAGNOSIS — R9389 Abnormal findings on diagnostic imaging of other specified body structures: Secondary | ICD-10-CM

## 2020-05-10 IMAGING — MG MM BREAST LOCALIZATION CLIP
8 series · 8 of 24 positions shown · non-contrast
Comparison: Previous exam(s).

CLINICAL DATA: Status post bilateral MR guided core biopsies.

EXAM:
3D DIAGNOSTIC BILATERAL MAMMOGRAM POST MRI BIOPSY x3

[L CC synth-2D]
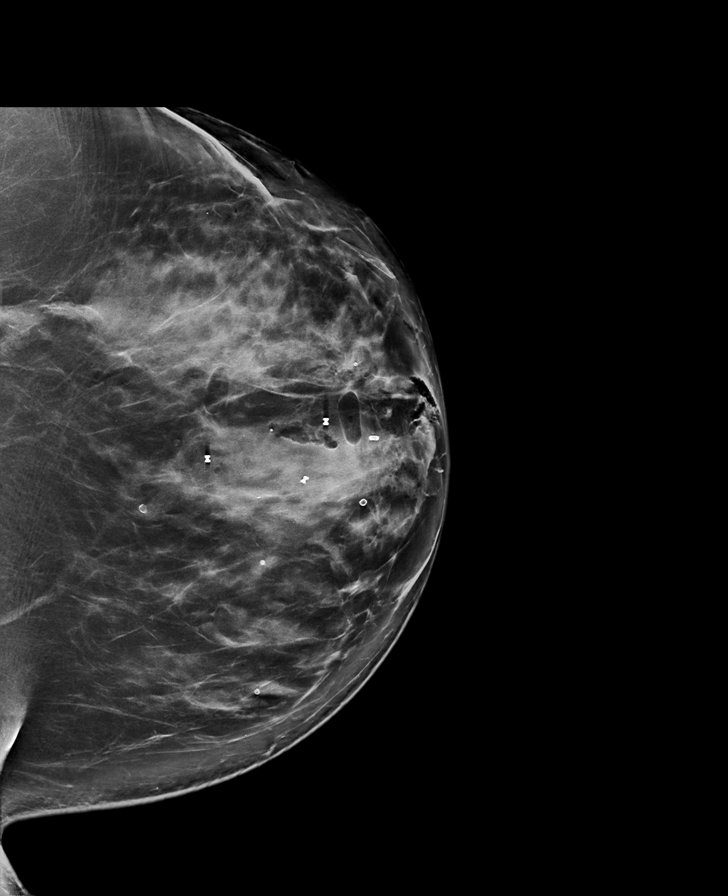

[L ML synth-2D]
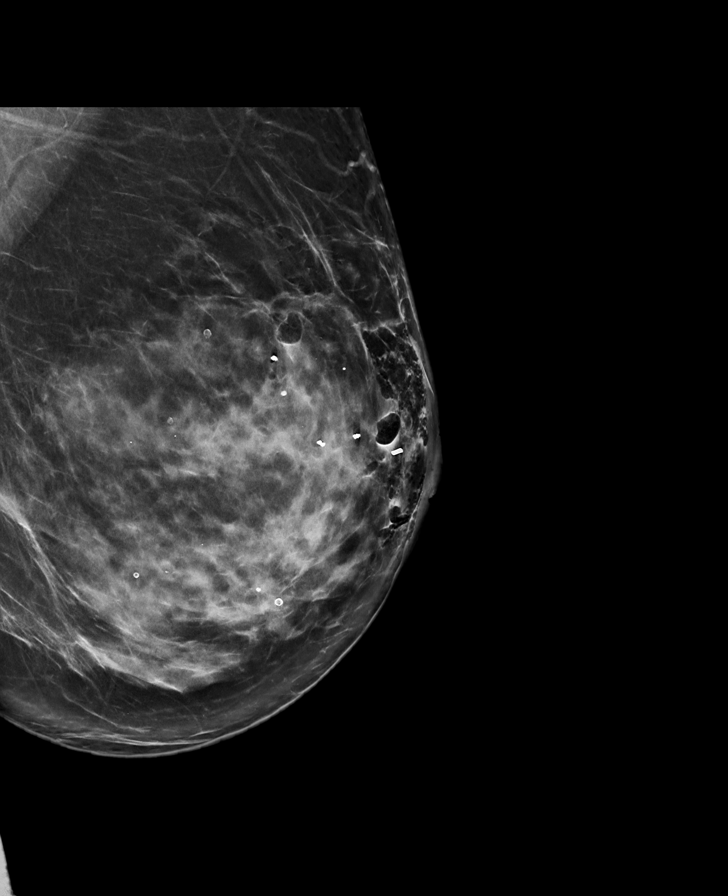

[R CC synth-2D]
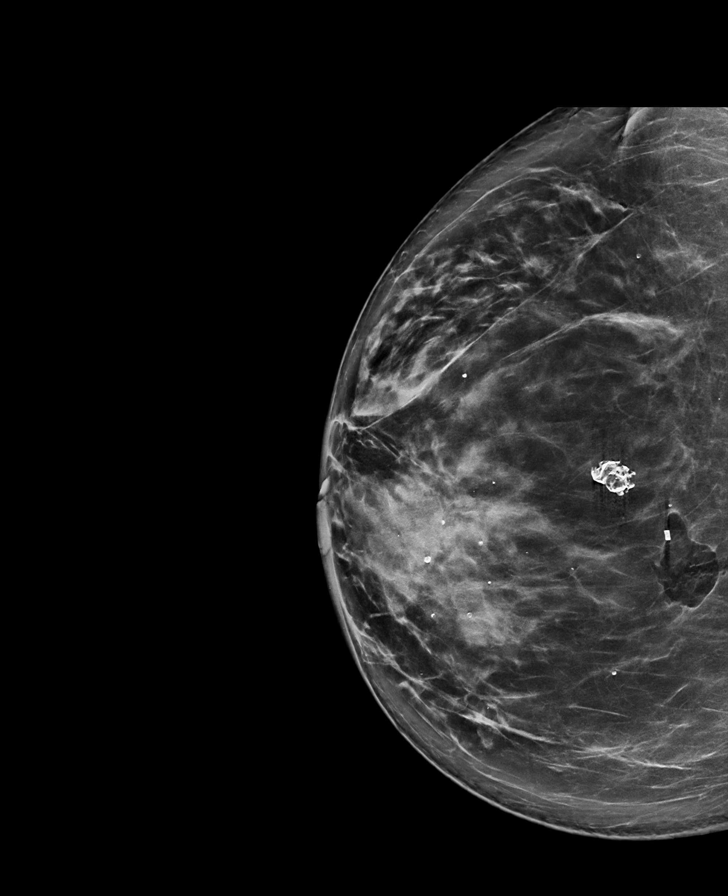

[R ML synth-2D]
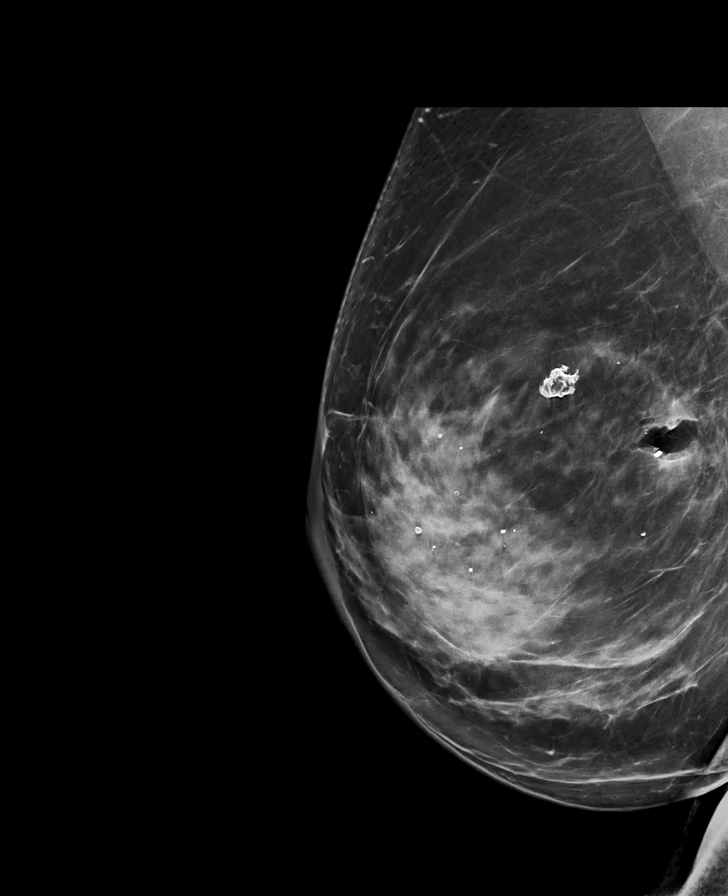

[R CC tomo · tomo slice 54/107.0]
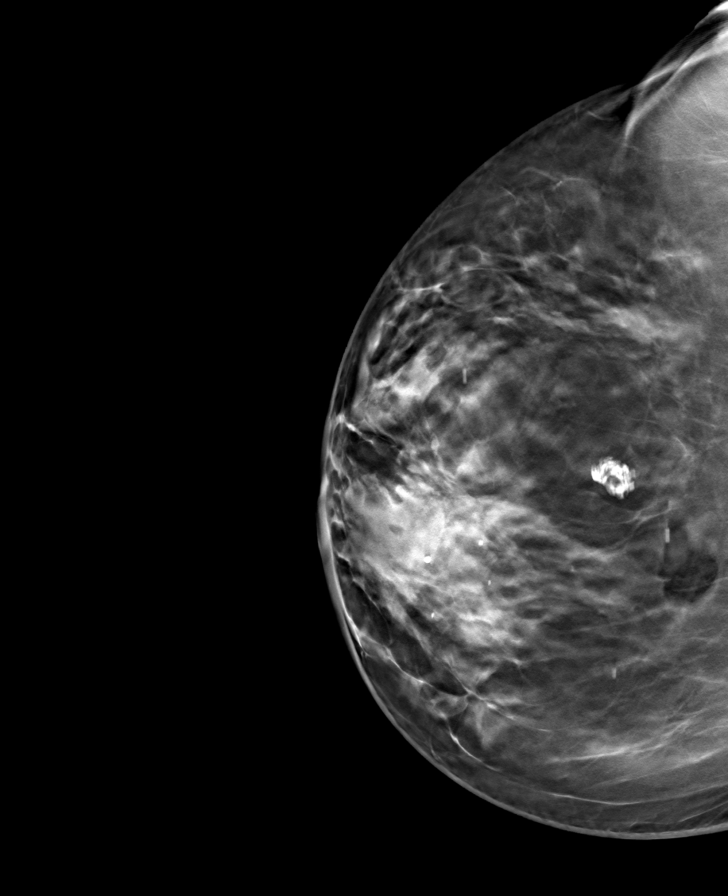

[L CC tomo · tomo slice 56/111.0]
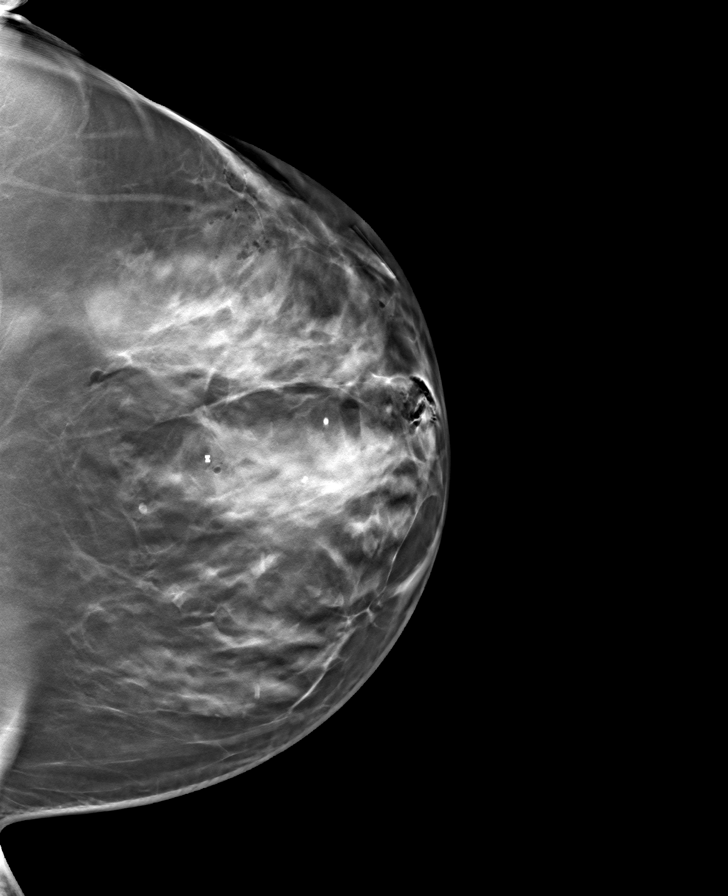

[L ML tomo · tomo slice 51/102.0]
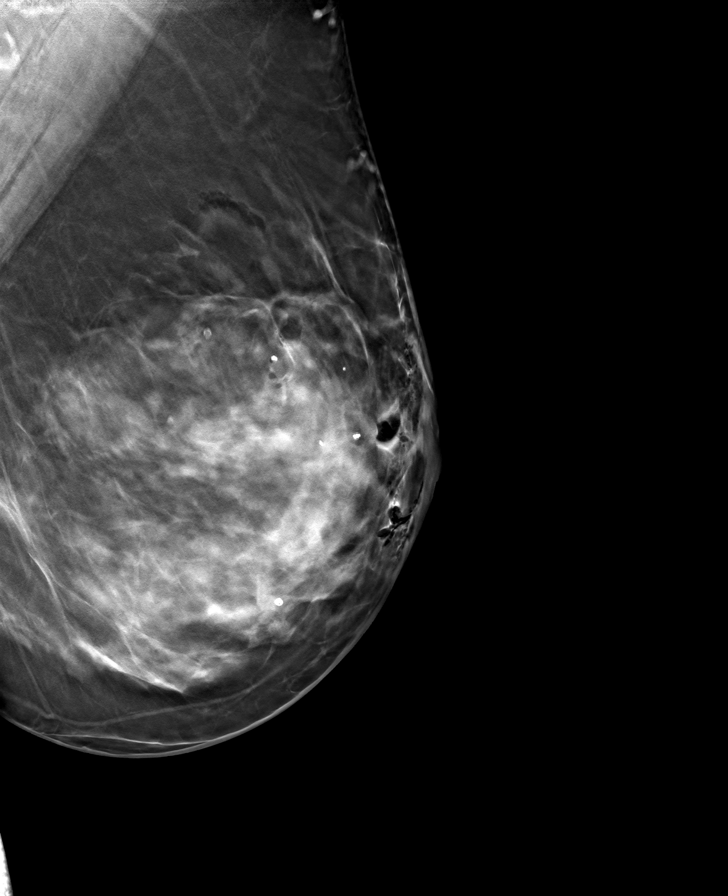

[R ML tomo · tomo slice 55/109.0]
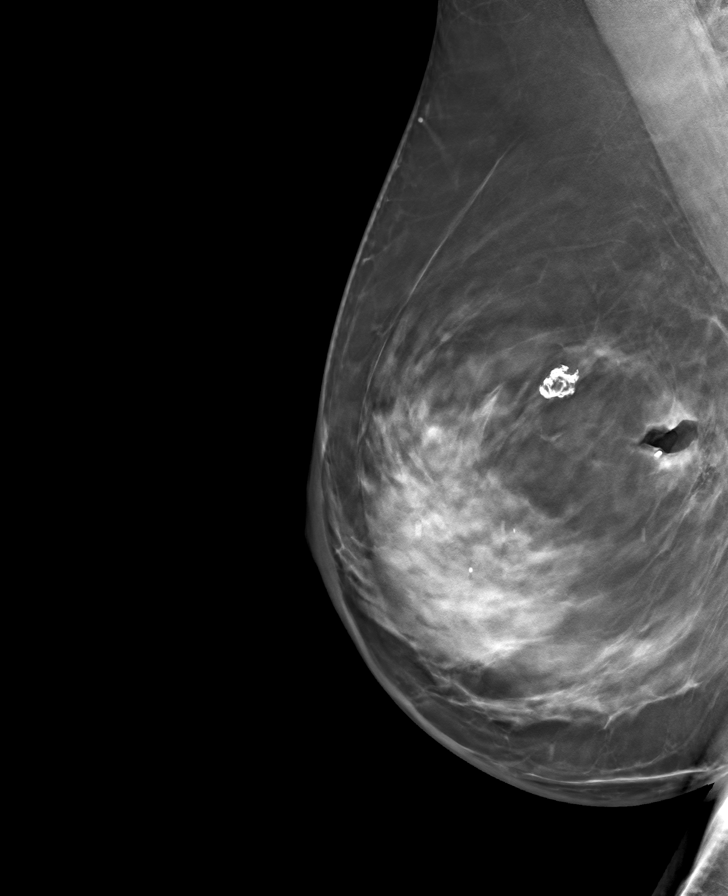

[8 of 24 positions shown; findings below may reference images not displayed]

FINDINGS: RIGHT breast: 3D Mammographic images were obtained following MRI
guided biopsy of posterior central RIGHT breast and placement of a
cylinder-shaped clip. The biopsy marking clip is in expected
position at the site of biopsy.

LEFT breast: 3D mammographic images were obtained following MR
guided biopsy of the retroareolar region of the LEFT breast and
placement of a cylinder-shaped clip. The clip is in expected
location. Following biopsy of non mass enhancement in the posterior
LOWER OUTER QUADRANT of the LEFT breast, a barbell shaped clip was
placed is identified in the expected location. Of note, when the
patient is prone for the MRI, the area of concern and clip are in
LOWER OUTER QUADRANT. With the patient standing, the area concern
and the clip are within the UPPER OUTER QUADRANT. However, there has
been no clip migration. The apparent change in position this likely
due to the patient's previous breast reduction surgery.
IMPRESSION: Tissue marker clips are in the expected locations after biopsy.

Final Assessment: Post Procedure Mammograms for Marker Placement

## 2020-05-10 IMAGING — MR MR BREAST BX W/ LOC DEV 1ST LEASION IMAGE BX SPEC MR GUIDE*R*
8 of 12 series · 30 of 48 positions shown · IV contrast (10ml multihance)
Comparison: MRI on [DATE]
COMPARISON: MRI on [DATE]

Addendum:
CLINICAL DATA: Patient presents for MR guided core biopsies of both
breasts. Patient has a history of RIGHT lumpectomy with radiation
treatment in [5S] in [REDACTED]. Patient reports the cancer was
[5S] positive, hormone negative. Patient had breast reduction
surgery in [5S]. Upon excision, pre cancerous tissue was identified
on pathology from the LEFT breast.

EXAM:
MRI GUIDED CORE NEEDLE BIOPSY OF THE RIGHT BREAST
MRI GUIDED CORE NEEDLE BIOPSY OF THE LEFT BREAST x 2
TECHNIQUE: Multiplanar, multisequence MR imaging of the bilateral breasts was
performed both before and after administration of intravenous
contrast.
CONTRAST:  10 ml Gadavist

[Series 2: fiducial bilateral · sagittal · 2.0mm · 1.33mm/px · 4 of 192 slices shown]
[im 1/192]
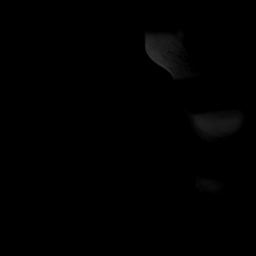
[im 64/192]
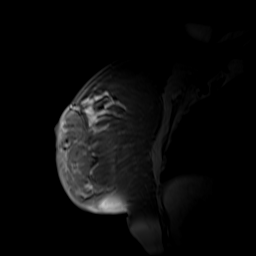
[im 128/192]
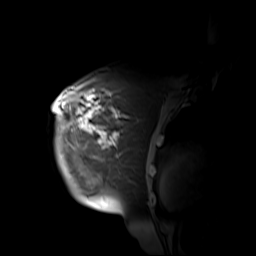
[im 192/192]
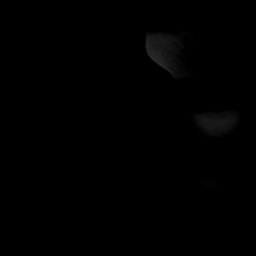

[Series 3: dynamic pre · axial · non-contrast · 1.3mm · 0.83mm/px · z∈[-156,+113]mm · 4 of 208 slices shown]
[im 1/208]
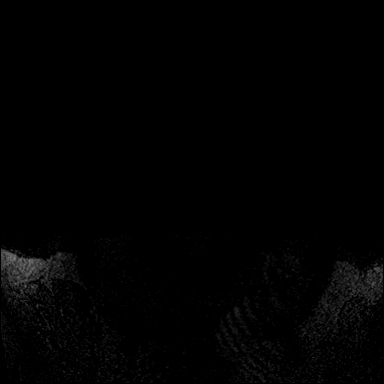
[im 70/208]
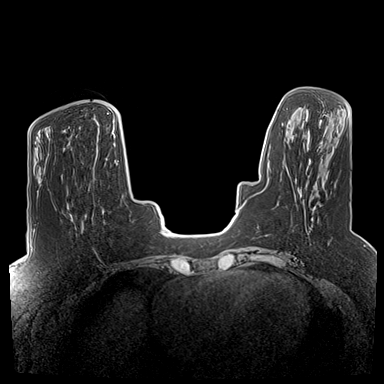
[im 139/208]
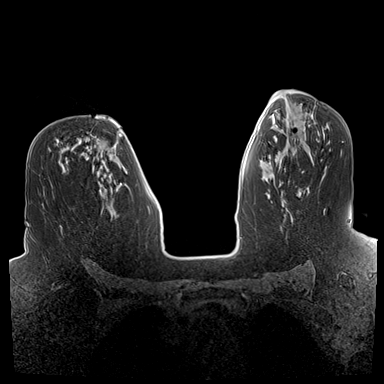
[im 208/208]
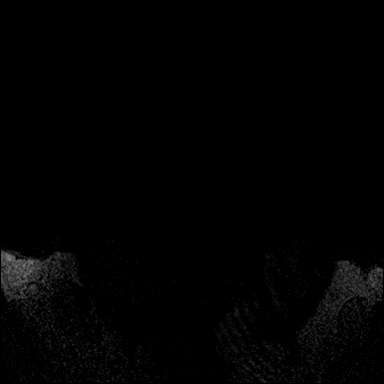

[Series 4: dynamic post 20 · axial · 1.3mm · 0.83mm/px · z∈[-156,+113]mm · 4 of 208 slices shown (1 of 2)]
[im 1/208]
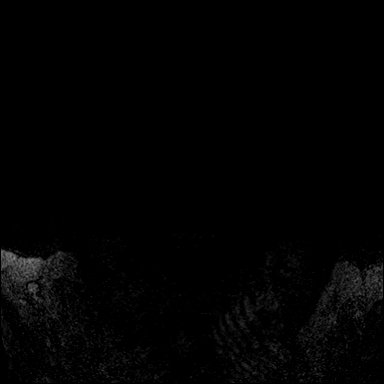
[im 70/208]
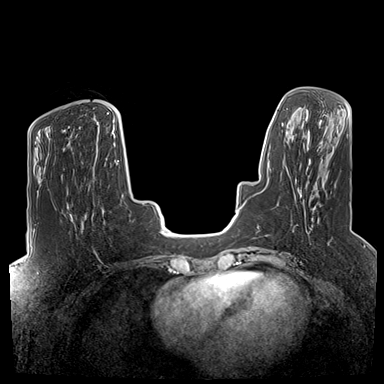
[im 139/208]
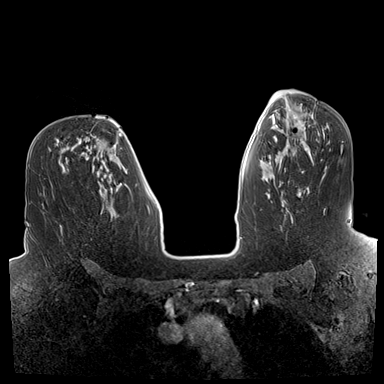
[im 208/208]
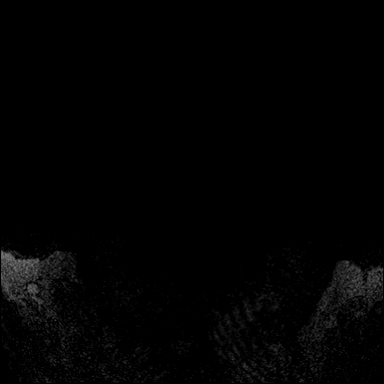

[Series 5: dynamic post 20 · axial · 1.3mm · 0.83mm/px · z∈[-156,+113]mm · 4 of 208 slices shown (2 of 2)]
[im 1/208]
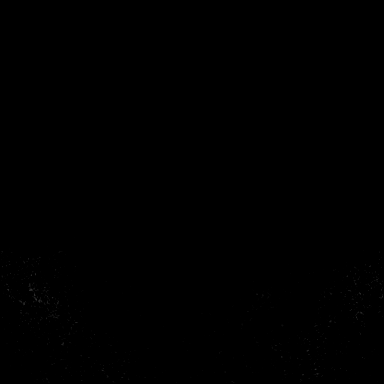
[im 70/208]
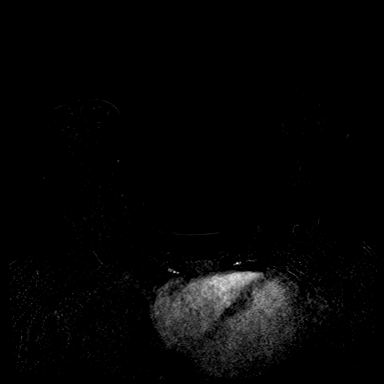
[im 139/208]
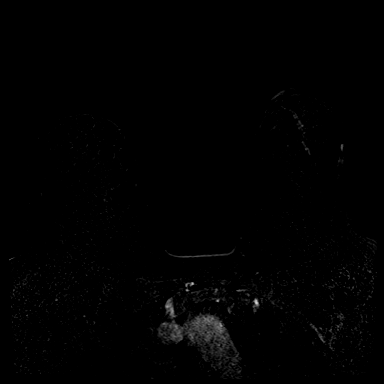
[im 208/208]
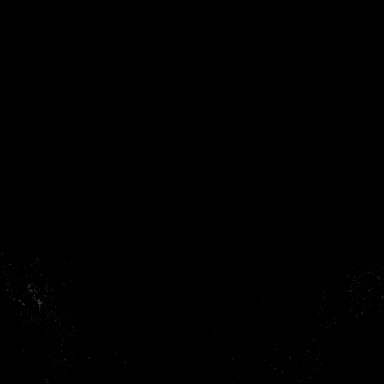

[Series 6: dynamic post 3 · axial · 1.3mm · 0.83mm/px · z∈[-156,+113]mm · 4 of 208 slices shown (1 of 2)]
[im 1/208]
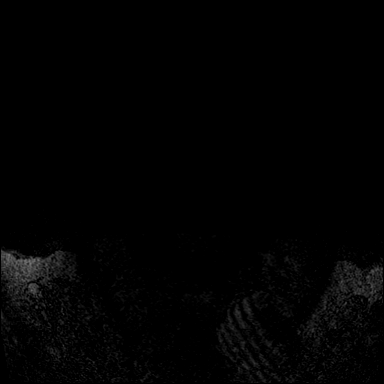
[im 70/208]
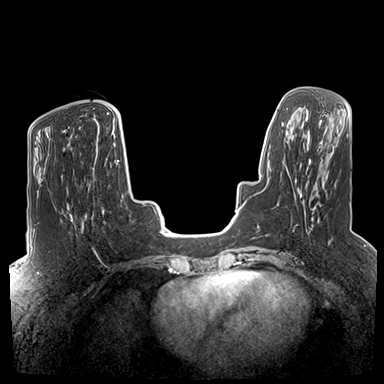
[im 139/208]
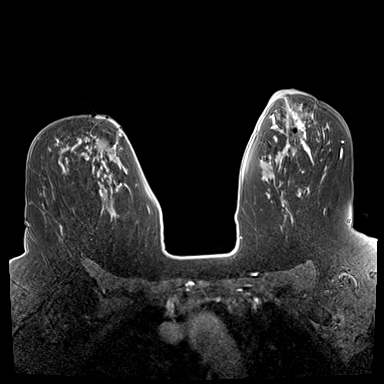
[im 208/208]
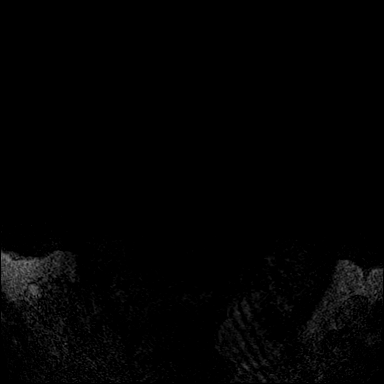

[Series 7: dynamic post 3 · axial · 1.3mm · 0.83mm/px · z∈[-156,+113]mm · 4 of 208 slices shown (2 of 2)]
[im 1/208]
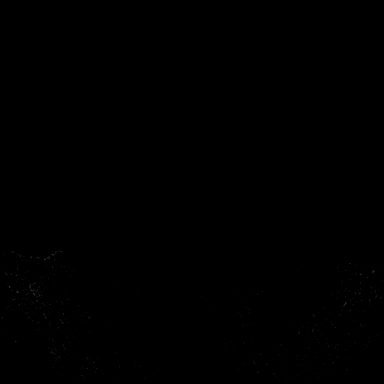
[im 70/208]
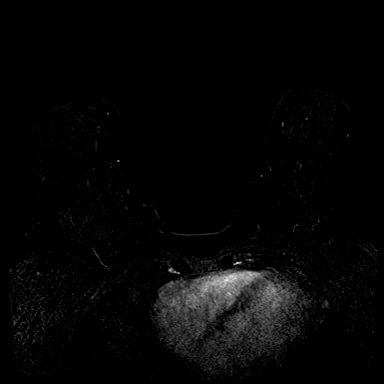
[im 139/208]
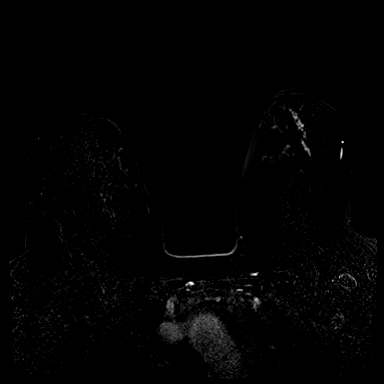
[im 208/208]
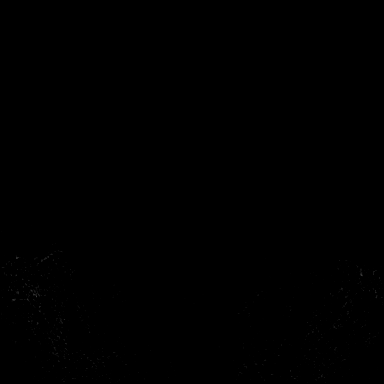

[Series 8: dynamic post 6 · axial · 1.3mm · 0.83mm/px · z∈[-156,+113]mm · 4 of 208 slices shown (1 of 2)]
[im 1/208]
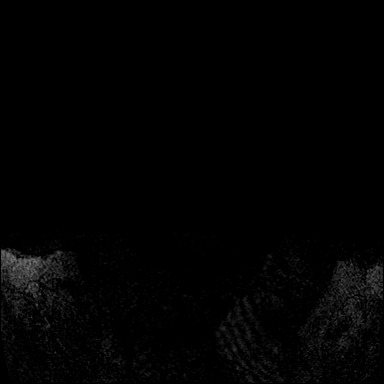
[im 70/208]
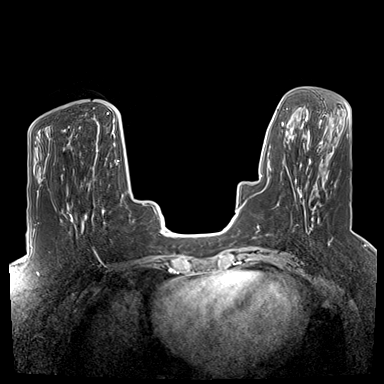
[im 139/208]
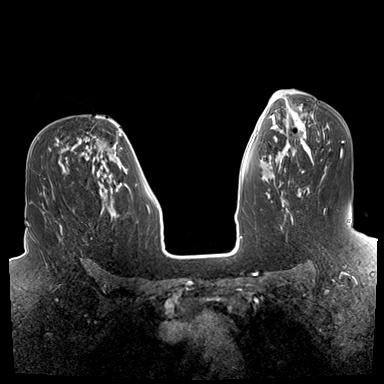
[im 208/208]
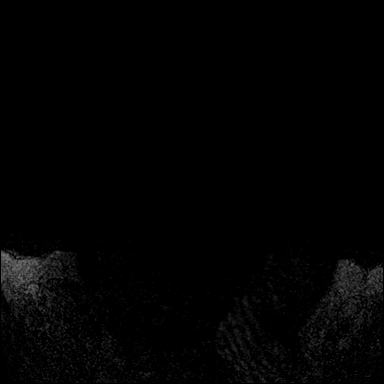

[Series 9: dynamic post 6 · axial · 1.3mm · 0.83mm/px · z∈[-156,-66]mm · 2 of 208 slices shown (2 of 2)]
[im 1/208]
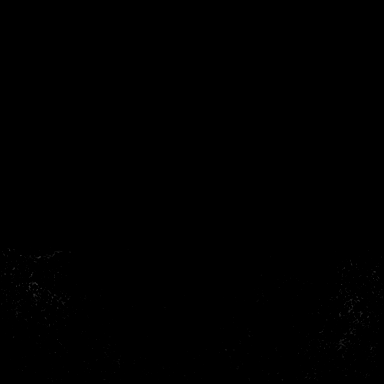
[im 70/208]
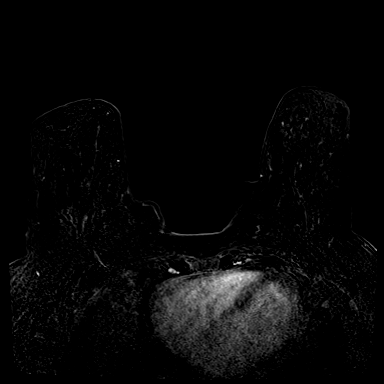

[30 of 48 positions shown; findings below may reference images not displayed]

FINDINGS: RIGHT BREAST:

Site 1: Lesion quadrant: Posterior LOWER central RIGHT breast,
cylinder clip

Using sterile technique and 1% lidocaine and 1% lidocaine with
epinephrine as local anesthetic, under MRI guidance, a 9 gauge
vacuum assisted device was used to perform core needle biopsy of
linear non mass enhancement in the posterior LOWER central RIGHT
breast using a LATERAL to MEDIAL approach.

At the conclusion of the procedure, cylinder-shaped tissue marker
clip was deployed into the biopsy cavity.

LEFT BREAST:

Site 2: Lesion quadrant: Retroareolar LEFT breast, cylinder clip

Using sterile technique and 1% lidocaine and 1% lidocaine with
epinephrine as local anesthetic, under MRI guidance, a 9 gauge
vacuum assisted device was used to perform core needle biopsy of
anterior aspect of linear enhancement in the retroareolar region of
the LEFT breast using a LATERAL to be approach.

At the conclusion of the procedure, cylinder tissue marker clip was
deployed into the biopsy cavity.

Site 3: Lesion quadrant: LOWER OUTER QUADRANT LEFT breast, barbell
clip

Using sterile technique and 1% lidocaine and 1% lidocaine with
epinephrine as local anesthetic, under MRI guidance, a 9 gauge
vacuum assisted device was used to perform core needle biopsy of
posterior aspect of linear non mass enhancement in the LOWER OUTER
QUADRANT of the LEFT breast using a LATERAL to MEDIAL approach.

At the conclusion of the procedure, barbell shaped tissue marker
clip was deployed into the biopsy cavity

Follow-up bilateral 2-view mammogram was performed and dictated
separately.
IMPRESSION: MRI guided biopsy of single site in the RIGHT breast and 2 sites in
the LEFT breast. No apparent complications.

ADDENDUM:
Pathology revealed GRADE II INVASIVE LOBULAR CARCINOMA of the RIGHT
breast, posterior lower central, (cylinder clip). This was found to
be concordant by Dr. MILY.

Pathology revealed FIBROCYSTIC CHANGE TO INCLUDE A PREDOMINANCE OF
STROMAL FIBROSIS of the LEFT breast, retroareolar, (cylinder clip).
This was found to be discordant by Dr. MILY, with
bracketed excision recommended.

Pathology revealed LOBULAR NEOPLASIA (ATYPICAL LOBULAR
HYPERPLASIA/LOBULAR CARCINOMA IN SITU) of the LEFT breast, lower
outer quadrant, (barbell clip). This was found to be discordant by
Dr. MILY, with bracketed excision recommended.

Pathology results were discussed with the patient by telephone. The
patient reported doing well after the biopsies with tenderness and
bleeding at the sites. Post biopsy instructions and care were
reviewed and questions were answered. The patient was encouraged to
call The [REDACTED] for any additional
concerns. My direct phone number was provided.

Medical oncology consultation has been arranged with Dr. MILY
MILY at [HOSPITAL] [HOSPITAL] on [DATE]. A surgical
referral will be arranged after the patient's appointment with Dr.
MILY.

Pathology results reported by MILY, RN on [DATE].

*** End of Addendum ***
FINDINGS: RIGHT BREAST:

Site 1: Lesion quadrant: Posterior LOWER central RIGHT breast,
cylinder clip

Using sterile technique and 1% lidocaine and 1% lidocaine with
epinephrine as local anesthetic, under MRI guidance, a 9 gauge
vacuum assisted device was used to perform core needle biopsy of
linear non mass enhancement in the posterior LOWER central RIGHT
breast using a LATERAL to MEDIAL approach.

At the conclusion of the procedure, cylinder-shaped tissue marker
clip was deployed into the biopsy cavity.

LEFT BREAST:

Site 2: Lesion quadrant: Retroareolar LEFT breast, cylinder clip

Using sterile technique and 1% lidocaine and 1% lidocaine with
epinephrine as local anesthetic, under MRI guidance, a 9 gauge
vacuum assisted device was used to perform core needle biopsy of
anterior aspect of linear enhancement in the retroareolar region of
the LEFT breast using a LATERAL to be approach.

At the conclusion of the procedure, cylinder tissue marker clip was
deployed into the biopsy cavity.

Site 3: Lesion quadrant: LOWER OUTER QUADRANT LEFT breast, barbell
clip

Using sterile technique and 1% lidocaine and 1% lidocaine with
epinephrine as local anesthetic, under MRI guidance, a 9 gauge
vacuum assisted device was used to perform core needle biopsy of
posterior aspect of linear non mass enhancement in the LOWER OUTER
QUADRANT of the LEFT breast using a LATERAL to MEDIAL approach.

At the conclusion of the procedure, barbell shaped tissue marker
clip was deployed into the biopsy cavity

Follow-up bilateral 2-view mammogram was performed and dictated
separately.
IMPRESSION: MRI guided biopsy of single site in the RIGHT breast and 2 sites in
the LEFT breast. No apparent complications.

## 2020-05-10 MED ORDER — GADOBUTROL 1 MMOL/ML IV SOLN
10.0000 mL | Freq: Once | INTRAVENOUS | Status: AC | PRN
  Administered 2020-05-10: 10 mL via INTRAVENOUS

## 2020-05-11 ENCOUNTER — Other Ambulatory Visit: Payer: Self-pay | Admitting: Hematology and Oncology

## 2020-05-13 ENCOUNTER — Encounter: Payer: Self-pay | Admitting: Hematology and Oncology

## 2020-05-14 ENCOUNTER — Other Ambulatory Visit: Payer: Self-pay

## 2020-05-14 ENCOUNTER — Inpatient Hospital Stay: Payer: 59 | Attending: Hematology and Oncology | Admitting: Hematology and Oncology

## 2020-05-14 DIAGNOSIS — C50411 Malignant neoplasm of upper-outer quadrant of right female breast: Secondary | ICD-10-CM | POA: Diagnosis not present

## 2020-05-14 DIAGNOSIS — Z79811 Long term (current) use of aromatase inhibitors: Secondary | ICD-10-CM | POA: Insufficient documentation

## 2020-05-14 DIAGNOSIS — Z170 Estrogen receptor positive status [ER+]: Secondary | ICD-10-CM | POA: Diagnosis not present

## 2020-05-14 DIAGNOSIS — Z9013 Acquired absence of bilateral breasts and nipples: Secondary | ICD-10-CM | POA: Diagnosis not present

## 2020-05-14 DIAGNOSIS — Z79899 Other long term (current) drug therapy: Secondary | ICD-10-CM | POA: Diagnosis not present

## 2020-05-14 DIAGNOSIS — Z923 Personal history of irradiation: Secondary | ICD-10-CM | POA: Insufficient documentation

## 2020-05-14 DIAGNOSIS — Z171 Estrogen receptor negative status [ER-]: Secondary | ICD-10-CM | POA: Diagnosis not present

## 2020-05-14 DIAGNOSIS — Z9221 Personal history of antineoplastic chemotherapy: Secondary | ICD-10-CM | POA: Insufficient documentation

## 2020-05-14 DIAGNOSIS — D0502 Lobular carcinoma in situ of left breast: Secondary | ICD-10-CM | POA: Diagnosis not present

## 2020-05-14 MED ORDER — ANASTROZOLE 1 MG PO TABS: 1.0000 mg | ORAL_TABLET | Freq: Every day | ORAL | 3 refills | Status: DC

## 2020-05-14 NOTE — Assessment & Plan Note (Signed)
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  Bilateral mastectomies with reconstruction 2. adjuvant antiestrogen therapy with anastrozole 1 mg daily x7 to 10 years Genetic testing  No role of radiation since she had prior radiation to the right breast. I suspect that this is a new primary breast cancer.  Return to clinic after surgery to discuss the pathology report. Patient has her daughter's graduation on May 13 or so.  She does not monitor to any surgery prior to that.  Therefore I started her on anastrozole 1 mg daily.  

## 2020-05-14 NOTE — Progress Notes (Signed)
Patient Care Team: Wenda Low, MD as PCP - General (Internal Medicine)  DIAGNOSIS:  Encounter Diagnosis  Name Primary?  . Malignant neoplasm of upper-outer quadrant of right breast in female, estrogen receptor negative ([place])     SUMMARY OF ONCOLOGIC HISTORY: Oncology History  Malignant neoplasm of upper-outer quadrant of right breast in female, estrogen receptor negative ([place])  08/27/2012 Initial Diagnosis   Stage 1a IDC with DCIS (T2N0M0) : ER-, PR-, HER positive 3+, Ki67 20-25%   08/27/2012 Cancer Staging   Staging form: Breast, AJCC 8th Edition - Clinical stage from 08/27/2012: Stage IIA (cT2, cN0, cM0, G3, ER-, PR-, HER2+) - Signed by Nicholas Lose, MD on 11/17/2019   09/06/2012 Breast MRI   Right breast, lateral to the nipple, 10x14x33mm mass with mildly irregular borders   09/10/2012 Echocardiogram   EF 55-60%    Chemotherapy   Neoadjuvant Taxotere/Carboplatin/Herceptin/Perjeta followed by adjuvant Herceptin maintenance   01/09/2013 Breast MRI   Complete pathological response to neoadjuvant treatment: right breast mass previously noted at 9:00 position no longer identified.   02/13/2013 Surgery   Right breast lumpectomy: benign parenchyma with some stromal fibrosis consistent with previous tumor site, focal area of atypical lobular hyperplasia, all lymph nodes negative.     Radiation Therapy   Adjuvant radiation   04/06/2013 Imaging   DEXA: T-score of -1.1, osteopenia    09/25/2016 Initial Biopsy   Columnar cell alteration with atypia, foreign body granuloma   05/10/2020 Relapse/Recurrence   Breast MRI detected new linear non-mass enhancement right breast 2.1 cm, non-mass enhancement centrally left breast spanning 10.5 cm, left breast nipple areolar complex enhancement: Biopsy right breast: Grade 2 invasive lobular cancer.  ER/PR positive HER-2 equivocal by IHC FISH pending, Ki-67 1% left breast biopsy anterior fibrocystic change, posterior atypical lobular  hyperplasia/LCIS     CHIEF COMPLIANT: New right breast cancer, left breast ALH/LCIS  INTERVAL HISTORY: Sierra Perez is a 60 year old with above-mentioned history of right breast cancer previously treated with neoadjuvant chemotherapy followed by lumpectomy and radiation for HER-2 positive breast cancer.  She has been in remission until recently when she underwent a breast MRI for surveillance which revealed left breast extensive non-mass enhancement measuring 10.5 cm in the right breast non-mass enhancement measuring 2.1 cm.  Biopsy of the right breast revealed invasive lobular cancer that was ER/PR positive HER-2 equivocal with a Ki-67 of 1%.  On the left breast as she had ALH/LCIS.  She is here today accompanied by her mother to discuss her treatment plan.   ALLERGIES:  is allergic to pollen extract, succinylcholine, and sulfa antibiotics.  MEDICATIONS:  Current Outpatient Medications  Medication Sig Dispense Refill  . anastrozole (ARIMIDEX) 1 MG tablet Take 1 tablet (1 mg total) by mouth daily. 90 tablet 3  . albuterol (VENTOLIN HFA) 108 (90 Base) MCG/ACT inhaler SMARTSIG:1-2 Puff(s) By Mouth Every 4-6 Hours PRN    . B Complex Vitamins (B COMPLEX 1 PO) vitamin B complex capsule    . Bacillus Coagulans-Inulin (PROBIOTIC) 1-250 BILLION-MG CAPS Probiotic    . Cholecalciferol (VITAMIN D-3 PO) Vitamin D3    . DULoxetine (CYMBALTA) 30 MG capsule Take 3 capsules (90 mg total) by mouth daily.    . DULoxetine (CYMBALTA) 60 MG capsule Take 60 mg by mouth daily.    [name] ELIQUIS 5 MG TABS tablet TAKE 1 TABLET(5 MG) BY MOUTH TWICE DAILY 180 tablet 0  . fluticasone (FLONASE) 50 MCG/ACT nasal spray Place into both nostrils daily.    [name] gabapentin (NEURONTIN)  600 MG tablet Take 600 mg by mouth 2 (two) times daily.    [name] ipratropium (ATROVENT) 0.03 % nasal spray Place 2 sprays into both nostrils 2 (two) times daily.    [name] levothyroxine (SYNTHROID) 50 MCG tablet Take 50 mcg by mouth every morning.    .  pantoprazole (PROTONIX) 40 MG tablet Take 1 tablet (40 mg total) by mouth daily.    . valACYclovir (VALTREX) 500 MG tablet Take 1 tablet (500 mg total) by mouth 2 (two) times daily. 30 tablet 6   No current facility-administered medications for this visit.    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  Bilateral mastectomies with reconstruction 2. adjuvant antiestrogen therapy with anastrozole 1 mg daily x7 to 10 years Genetic testing  No role of radiation since she had prior radiation to the right breast. I suspect that this is a new primary breast cancer.  Return to clinic after surgery to discuss the pathology report. Patient has her daughter's graduation on May 13 or so.  She does not monitor to any surgery prior to that.  Therefore I started her on anastrozole 1 mg daily.     No orders of the defined types were placed in this encounter.  The patient has a good understanding of the overall plan. she agrees with it. she will call with any problems that may develop before the next visit here. Total time spent: 45 mins including face to face time and time spent  for planning, charting and co-ordination of care   Harriette Ohara, MD 05/14/20

## 2020-05-15 ENCOUNTER — Encounter: Payer: Self-pay | Admitting: *Deleted

## 2020-05-15 ENCOUNTER — Inpatient Hospital Stay: Payer: 59

## 2020-05-15 ENCOUNTER — Encounter: Payer: Self-pay | Admitting: Genetic Counselor

## 2020-05-15 ENCOUNTER — Telehealth: Payer: Self-pay | Admitting: *Deleted

## 2020-05-15 ENCOUNTER — Ambulatory Visit (HOSPITAL_BASED_OUTPATIENT_CLINIC_OR_DEPARTMENT_OTHER): Payer: 59 | Admitting: Genetic Counselor

## 2020-05-15 DIAGNOSIS — C50411 Malignant neoplasm of upper-outer quadrant of right female breast: Secondary | ICD-10-CM

## 2020-05-15 DIAGNOSIS — Z171 Estrogen receptor negative status [ER-]: Secondary | ICD-10-CM

## 2020-05-15 DIAGNOSIS — Z800 Family history of malignant neoplasm of digestive organs: Secondary | ICD-10-CM

## 2020-05-15 DIAGNOSIS — Z803 Family history of malignant neoplasm of breast: Secondary | ICD-10-CM

## 2020-05-15 DIAGNOSIS — Z853 Personal history of malignant neoplasm of breast: Secondary | ICD-10-CM | POA: Insufficient documentation

## 2020-05-15 NOTE — Telephone Encounter (Signed)
Spoke with patient to follow up from her apt with Dr. Lindi Adie and assess navigation needs. Made her aware of her appointment with Dr. Donne Hazel for 4/22 at 10am. I let her know I have also placed a referral for Dr. Iran Planas and they will be calling her with an appointment. Gave contact information and encouraged her to call with any questions or concerns. Patient verbalized understanding.

## 2020-05-15 NOTE — Progress Notes (Signed)
REFERRING PROVIDER: Nicholas Lose, MD [address] [place],  [place] [address]  PRIMARY PROVIDER:  Wenda Low, MD  PRIMARY REASON FOR VISIT:  1. Malignant neoplasm of upper-outer quadrant of right breast in female, estrogen receptor negative ([place])   2. Family history of breast cancer   3. Family history of colon cancer   4. Personal history of malignant neoplasm of breast      HISTORY OF PRESENT ILLNESS:   Sierra Perez, a 60 y.o. female, was seen for a [facility] [place] cancer genetics consultation at the request of Dr. Lindi Adie due to a personal and family history of breast cancer.  [name] presents to clinic today to discuss the possibility of a hereditary predisposition to cancer, genetic testing, and to further clarify her future cancer risks, as well as potential cancer risks for family members.   At the age of 36, [name] was diagnosed with cancer of the right breast. The tumor was ER/PR- and Her2+ The treatment plan included chemotherapy, lumpectomy and radiation.  In 2022, at the age of 20, [name] was diagnosed with cancer in the right breast again.  The tumor is ER/PR + and Her2-.   She is considering a double mastectomy.   CANCER HISTORY:  Oncology History  Malignant neoplasm of upper-outer quadrant of right breast in female, estrogen receptor negative ([place])  08/27/2012 Initial Diagnosis   Stage 24a IDC with DCIS (T2N0M0) : ER-, PR-, HER positive 3+, Ki67 20-25%   08/27/2012 Cancer Staging   Staging form: Breast, AJCC 8th Edition - Clinical stage from 08/27/2012: Stage IIA (cT2, cN0, cM0, G3, ER-, PR-, HER2+) - Signed by Nicholas Lose, MD on 11/17/2019   09/06/2012 Breast MRI   Right breast, lateral to the nipple, 10x14x56m mass with mildly irregular borders   09/10/2012 Echocardiogram   EF 55-60%    Chemotherapy   Neoadjuvant Taxotere/Carboplatin/Herceptin/Perjeta followed by adjuvant Herceptin maintenance   01/09/2013 Breast MRI   Complete  pathological response to neoadjuvant treatment: right breast mass previously noted at 9:00 position no longer identified.   02/13/2013 Surgery   Right breast lumpectomy: benign parenchyma with some stromal fibrosis consistent with previous tumor site, focal area of atypical lobular hyperplasia, all lymph nodes negative.     Radiation Therapy   Adjuvant radiation   04/06/2013 Imaging   DEXA: T-score of -1.1, osteopenia    09/25/2016 Initial Biopsy   Columnar cell alteration with atypia, foreign body granuloma   05/10/2020 Relapse/Recurrence   Breast MRI detected new linear non-mass enhancement right breast 2.1 cm, non-mass enhancement centrally left breast spanning 10.5 cm, left breast nipple areolar complex enhancement: Biopsy right breast: Grade 2 invasive lobular cancer.  ER/PR positive HER-2 equivocal by IHC FISH pending, Ki-67 1% left breast biopsy anterior fibrocystic change, posterior atypical lobular hyperplasia/LCIS      RISK FACTORS:  Menarche was at age 60-13  First live birth at age 60  OCP use for approximately <5 years.  Ovaries intact: no.  Hysterectomy: yes.  Menopausal status: postmenopausal.  HRT use: 0 years. Colonoscopy: yes; some polyps. Mammogram within the last year: yes. Number of breast biopsies: 4. Up to date with pelvic exams: no. Any excessive radiation exposure in the past: yes  Past Medical History:  Diagnosis Date  . Anxiety   . Breast cancer ([facility] 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, her-2 positive, ER/PR negative  . Clotting disorder ([facility]    testing was negative, h/o DVT while on treatment  . Complex regional pain syndrome  i of right lower limb   . DVT (deep venous thrombosis) ([place])    started in 2015, multiple  . Family history of breast cancer   . Family history of colon cancer   . Fibroid   . Heart murmur   . HPV in female   . 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 IBS (irritable bowel syndrome)   . Personal history of  malignant neoplasm of breast   . Thyroid disease     Past Surgical History:  Procedure Laterality Date  . ABDOMINAL HYSTERECTOMY  2010   fibroids, h/o abnormal pap smears  . BREAST SURGERY Right 2015   right lumpectomy with reduction and lift  . DENTAL SURGERY    . FOOT SURGERY     dislocated toe  . [place] RESECTION  2014  . LAPAROSCOPIC OOPHERECTOMY Bilateral 2016  . REPLACEMENT TOTAL KNEE Left 2018   also had 3 prior arthroscopies  . VENA CAVA FILTER PLACEMENT  2017    Social History   Socioeconomic History  . Marital status: Single    Spouse name: Not on file  . Number of children: Not on file  . Years of education: Not on file  . Highest education level: Not on file  Occupational History  . Not on file  Tobacco Use  . Smoking status: Never Smoker  . Smokeless tobacco: Never Used  Vaping Use  . Vaping Use: Never used  Substance and Sexual Activity  . Alcohol use: Yes    Comment: occ  . Drug use: Never  . Sexual activity: Not Currently    Birth control/protection: Surgical    Comment: Hysterectomy  Other Topics Concern  . Not on file  Social History Narrative  . Not on file   Social Determinants of Health   Financial Resource Strain: Not on file  Food Insecurity: Not on file  Transportation Needs: Not on file  Physical Activity: Not on file  Stress: Not on file  Social Connections: Not on file     FAMILY HISTORY:  We obtained a detailed, 4-generation family history.  Significant diagnoses are listed below: Family History  Problem Relation Age of Onset  . Hypertension Mother   . Skin cancer Mother   . Diabetes Father   . Kidney disease Father   . Dementia Father   . Factor VIII deficiency Father   . Bladder Cancer Maternal Grandmother        dx in late 4s; smoker  . Stroke Maternal Grandmother   . Lung cancer Maternal Grandfather   . Colon cancer Maternal Grandfather 48  . Thyroid cancer Maternal Grandfather        dx in  his 73s  . Breast cancer Paternal Grandmother        dx in 17s  . Prostate cancer Other        MGMs brother    The patient has one daughter who is cancer free.  She has a brother who is cancer free.  Both parents are living.  The patient's father has Factor 73 and has been treated for this in the past. He had a brother who died but had a son who had leukemia at 70.  The paternal grandparents are deceased.  The grandmother had breast cancer in her 2's and the grandfather died of a heart attack.  The patient's mother had two brothers and a sister all who were cancer free.  The maternal grandmother had  bladder cancer and she had a brother who had prostate cancer.  The grandmother was a smoker.  The grandfather had thyroid cancer in his 3's and colon cancer at 34.  He too was a smoker.  Ms. Locicero is unaware of previous family history of genetic testing for hereditary cancer risks. Patient's maternal ancestors are of [place] and [place] European descent, and paternal ancestors are of [place] European descent. There is reported Ashkenazi Jewish ancestry. There is no known consanguinity.  GENETIC COUNSELING ASSESSMENT: Ms. Mayor is a 60 y.o. female with a personal and family history of breast cancer which is somewhat suggestive of a hereditary cancer syndrome and predisposition to cancer given her personal and family history as well as Ashkenazi Jewish descent. We, therefore, discussed and recommended the following at today's visit.   DISCUSSION: We discussed that 5 - 10% of breast cancer is hereditary, with most cases associated with BRCA mutations. Based on her [place], this is the greatest concern.  There are other genes that can be associated with hereditary breast cancer syndromes.  These include ATM, CHEK2 and PALB2.  We discussed that testing is beneficial for several reasons including knowing how to follow individuals after completing their treatment, identifying whether potential  treatment options such as PARP inhibitors would be beneficial, and understand if other family members could be at risk for cancer and allow them to undergo genetic testing.   We reviewed the characteristics, features and inheritance patterns of hereditary cancer syndromes. We also discussed genetic testing, including the appropriate family members to test, the process of testing, insurance coverage and turn-around-time for results. We discussed the implications of a negative, positive, carrier and/or variant of uncertain significant result. We recommended Ms. Aylesworth pursue genetic testing for the BRCAplus gene panel. Once this panel comes back we will reflex to the CancerNext-Expanded+RNA panel.  Based on Ms. Daino's personal and family history of cancer, she meets medical criteria for genetic testing. Despite that she meets criteria, she may still have an out of pocket cost. We discussed that if her out of pocket cost for testing is over $100, the laboratory will call and confirm whether she wants to proceed with testing.  If the out of pocket cost of testing is less than $100 she will be billed by the genetic testing laboratory.   PLAN: After considering the risks, benefits, and limitations, Ms. Mealy provided informed consent to pursue genetic testing and the blood sample was sent to [facility] for analysis of the CancerNext-Expanded. Results should be available within approximately 2-3 weeks' time, at which point they will be disclosed by telephone to Ms. Schaff, as will any additional recommendations warranted by these results. Ms. Breese will receive a summary of her genetic counseling visit and a copy of her results once available. This information will also be available in Epic.   Lastly, we encouraged Ms. Howton to remain in contact with cancer genetics annually so that we can continuously update the family history and inform her of any changes in cancer genetics and testing that may  be of benefit for this family.   Ms. Obey questions were answered to her satisfaction today. Our contact information was provided should additional questions or concerns arise. Thank you for the referral and allowing [place] to share in the care of your patient.   Raequon Catanzaro P. Florene Glen, [place], [facility] Licensed, Insurance risk surveyor [email or web address] phone: [phone]  The patient was seen for a total of 35 minutes in face-to-face genetic counseling.  This  patient was discussed with Drs. Magrinat, Lindi Adie and/or Burr Medico who agrees with the above.    _______________________________________________________________________ For Office Staff:  Number of people involved in session: 2 Was an Intern/ student involved with case: no

## 2020-05-16 ENCOUNTER — Ambulatory Visit (HOSPITAL_BASED_OUTPATIENT_CLINIC_OR_DEPARTMENT_OTHER): Payer: 59 | Admitting: Obstetrics & Gynecology

## 2020-05-16 ENCOUNTER — Ambulatory Visit: Payer: 59 | Admitting: Hematology and Oncology

## 2020-05-16 LAB — GENETIC SCREENING ORDER

## 2020-05-17 ENCOUNTER — Ambulatory Visit: Payer: Managed Care, Other (non HMO) | Admitting: Hematology and Oncology

## 2020-05-20 ENCOUNTER — Encounter: Payer: Self-pay | Admitting: *Deleted

## 2020-05-23 DIAGNOSIS — Z923 Personal history of irradiation: Secondary | ICD-10-CM | POA: Insufficient documentation

## 2020-05-23 DIAGNOSIS — N6092 Unspecified benign mammary dysplasia of left breast: Secondary | ICD-10-CM | POA: Insufficient documentation

## 2020-05-24 ENCOUNTER — Telehealth: Payer: Self-pay | Admitting: Genetic Counselor

## 2020-05-24 ENCOUNTER — Telehealth: Payer: Self-pay | Admitting: Licensed Clinical Social Worker

## 2020-05-24 NOTE — Telephone Encounter (Signed)
Revealed negative genetic testing on the BRCAPlus STAT panel.  The BRCAPlus gene panel offered by [facility] and includes sequencing and rearrangement analysis for the following 8 genes: ATM, BRCA1, BRCA2, CDH1, CHEK2, PALB2, PTEN, and TP53.  The remainder of the genes will be tested and we will call with those results when they are available.

## 2020-05-24 NOTE — Telephone Encounter (Signed)
[place] Work  Clinical Social Work was referred by Engineer, water for assessment of psychosocial needs in patient with second breast cancer.  Clinical Social Worker attempted to contact patient by phone  to offer support and assess for needs.  No answer. Left VM with direct contact information.    [place], [place] Worker [employer]

## 2020-05-27 ENCOUNTER — Encounter: Payer: Self-pay | Admitting: *Deleted

## 2020-05-27 ENCOUNTER — Encounter: Payer: Self-pay | Admitting: Genetic Counselor

## 2020-05-27 ENCOUNTER — Telehealth: Payer: Self-pay | Admitting: Genetic Counselor

## 2020-05-27 ENCOUNTER — Ambulatory Visit: Payer: Self-pay | Admitting: Genetic Counselor

## 2020-05-27 DIAGNOSIS — K219 Gastro-esophageal reflux disease without esophagitis: Secondary | ICD-10-CM | POA: Insufficient documentation

## 2020-05-27 DIAGNOSIS — Z1379 Encounter for other screening for genetic and chromosomal anomalies: Secondary | ICD-10-CM

## 2020-05-27 DIAGNOSIS — G9050 Complex regional pain syndrome I, unspecified: Secondary | ICD-10-CM | POA: Insufficient documentation

## 2020-05-27 DIAGNOSIS — K2930 Chronic superficial gastritis without bleeding: Secondary | ICD-10-CM | POA: Insufficient documentation

## 2020-05-27 DIAGNOSIS — M797 Fibromyalgia: Secondary | ICD-10-CM | POA: Insufficient documentation

## 2020-05-27 DIAGNOSIS — R7303 Prediabetes: Secondary | ICD-10-CM | POA: Insufficient documentation

## 2020-05-27 NOTE — Progress Notes (Signed)
HPI:  Sierra Perez was previously seen in the [facility] clinic due to a personal and family history of cancer and concerns regarding a hereditary predisposition to cancer. Please refer to our prior cancer genetics clinic note for more information regarding our discussion, assessment and recommendations, at the time. Sierra Perez recent genetic test results were disclosed to her, as were recommendations warranted by these results. These results and recommendations are discussed in more detail below.  CANCER HISTORY:  Oncology History  Malignant neoplasm of upper-outer quadrant of right breast in female, estrogen receptor negative ([place])  08/27/2012 Initial Diagnosis   Stage 1a IDC with DCIS (T2N0M0) : ER-, PR-, HER positive 3+, Ki67 20-25%   08/27/2012 Cancer Staging   Staging form: Breast, AJCC 8th Edition - Clinical stage from 08/27/2012: Stage IIA (cT2, cN0, cM0, G3, ER-, PR-, HER2+) - Signed by Nicholas Lose, MD on 11/17/2019   09/06/2012 Breast MRI   Right breast, lateral to the nipple, 10x14x66m mass with mildly irregular borders   09/10/2012 Echocardiogram   EF 55-60%    Chemotherapy   Neoadjuvant Taxotere/Carboplatin/Herceptin/Perjeta followed by adjuvant Herceptin maintenance   01/09/2013 Breast MRI   Complete pathological response to neoadjuvant treatment: right breast mass previously noted at 9:00 position no longer identified.   02/13/2013 Surgery   Right breast lumpectomy: benign parenchyma with some stromal fibrosis consistent with previous tumor site, focal area of atypical lobular hyperplasia, all lymph nodes negative.     Radiation Therapy   Adjuvant radiation   04/06/2013 Imaging   DEXA: T-score of -1.1, osteopenia    09/25/2016 Initial Biopsy   Columnar cell alteration with atypia, foreign body granuloma   05/10/2020 Relapse/Recurrence   Breast MRI detected new linear non-mass enhancement right breast 2.1 cm, non-mass enhancement centrally left breast  spanning 10.5 cm, left breast nipple areolar complex enhancement: Biopsy right breast: Grade 2 invasive lobular cancer.  ER/PR positive HER-2 equivocal by IHC FISH pending, Ki-67 1% left breast biopsy anterior fibrocystic change, posterior atypical lobular hyperplasia/LCIS   05/26/2020 Genetic Testing   Negative genetic testing on the CancerNext-Expanded+RNAinsight panel.  The [facility]Expanded [facility] includes sequencing and rearrangement analysis for the following 77 genes: AIP, ALK, APC*, ATM*, AXIN2, BAP1, BARD1, BLM, BMPR1A, BRCA1*, BRCA2*, BRIP1*, CDC73, CDH1*, CDK4, CDKN1B, CDKN2A, CHEK2*, CTNNA1, DICER1, FANCC, FH, FLCN, GALNT12, KIF1B, LZTR1, MAX, MEN1, MET, MLH1*, MSH2*, MSH3, MSH6*, MUTYH*, NBN, NF1*, NF2, NTHL1, PALB2*, PHOX2B, PMS2*, POT1, PRKAR1A, PTCH1, PTEN*, RAD51C*, RAD51D*, RB1, RECQL, RET, SDHA, SDHAF2, SDHB, SDHC, SDHD, SMAD4, SMARCA4, SMARCB1, SMARCE1, STK11, SUFU, TMEM127, TP53*, TSC1, TSC2, VHL and XRCC2 (sequencing and deletion/duplication); EGFR, EGLN1, HOXB13, KIT, MITF, PDGFRA, POLD1, and POLE (sequencing only); EPCAM and GREM1 (deletion/duplication only). DNA and RNA analyses performed for * genes. The report date is May 26, 2020.      FAMILY HISTORY:  We obtained a detailed, 4-generation family history.  Significant diagnoses are listed below: Family History  Problem Relation Age of Onset  . Hypertension Mother   . Skin cancer Mother   . Diabetes Father   . Kidney disease Father   . Dementia Father   . Factor VIII deficiency Father   . Bladder Cancer Maternal Grandmother        dx in late 642s smoker  . Stroke Maternal Grandmother   . Lung cancer Maternal Grandfather   . Colon cancer Maternal Grandfather 734 . Thyroid cancer Maternal Grandfather        dx in his  11s  . Breast cancer Paternal Grandmother        dx in 16s  . Prostate cancer Other        MGMs brother    The patient has one daughter who is cancer free.  She  has a brother who is cancer free.  Both parents are living.  The patient's father has Factor 22 and has been treated for this in the past. He had a brother who died but had a son who had leukemia at 29.  The paternal grandparents are deceased.  The grandmother had breast cancer in her 65's and the grandfather died of a heart attack.  The patient's mother had two brothers and a sister all who were cancer free.  The maternal grandmother had bladder cancer and she had a brother who had prostate cancer.  The grandmother was a smoker.  The grandfather had thyroid cancer in his 71's and colon cancer at 35.  He too was a smoker.  Sierra Perez is unaware of previous family history of genetic testing for hereditary cancer risks. Patient's maternal ancestors are of [place] and [place] European descent, and paternal ancestors are of [place] European descent. There is reported Ashkenazi Jewish ancestry. There is no known consanguinity.  GENETIC TEST RESULTS: Genetic testing reported out on May 26, 2020 through the CancerNext-Expanded+RNAinsight cancer panel found no pathogenic mutations. The CancerNext-Expanded gene panel offered by [facility] and includes sequencing and rearrangement analysis for the following 77 genes: AIP, ALK, APC*, ATM*, AXIN2, BAP1, BARD1, BLM, BMPR1A, BRCA1*, BRCA2*, BRIP1*, CDC73, CDH1*, CDK4, CDKN1B, CDKN2A, CHEK2*, CTNNA1, DICER1, FANCC, FH, FLCN, GALNT12, KIF1B, LZTR1, MAX, MEN1, MET, MLH1*, MSH2*, MSH3, MSH6*, MUTYH*, NBN, NF1*, NF2, NTHL1, PALB2*, PHOX2B, PMS2*, POT1, PRKAR1A, PTCH1, PTEN*, RAD51C*, RAD51D*, RB1, RECQL, RET, SDHA, SDHAF2, SDHB, SDHC, SDHD, SMAD4, SMARCA4, SMARCB1, SMARCE1, STK11, SUFU, TMEM127, TP53*, TSC1, TSC2, VHL and XRCC2 (sequencing and deletion/duplication); EGFR, EGLN1, HOXB13, KIT, MITF, PDGFRA, POLD1, and POLE (sequencing only); EPCAM and GREM1 (deletion/duplication only). DNA and RNA analyses performed for * genes. The test report has been scanned into  EPIC and is located under the Molecular Pathology section of the Results Review tab.  A portion of the result report is included below for reference.     We discussed with Sierra Perez that because current genetic testing is not perfect, it is possible there may be a gene mutation in one of these genes that current testing cannot detect, but that chance is small.  We also discussed, that there could be another gene that has not yet been discovered, or that we have not yet tested, that is responsible for the cancer diagnoses in the family. It is also possible there is a hereditary cause for the cancer in the family that Sierra Perez did not inherit and therefore was not identified in her testing.  Therefore, it is important to remain in touch with cancer genetics in the future so that we can continue to offer Sierra Perez the most up to date genetic testing.   ADDITIONAL GENETIC TESTING: We discussed with Sierra Perez that her genetic testing was fairly extensive.  If there are genes identified to increase cancer risk that can be analyzed in the future, we would be happy to discuss and coordinate this testing at that time.    CANCER SCREENING RECOMMENDATIONS: Sierra Perez test result is considered negative (normal).  This means that we have not identified a hereditary cause for her personal and family history of cancer at this time.  Most cancers happen by chance and this negative test suggests that her cancer may fall into this category.    While reassuring, this does not definitively rule out a hereditary predisposition to cancer. It is still possible that there could be genetic mutations that are undetectable by current technology. There could be genetic mutations in genes that have not been tested or identified to increase cancer risk.  Therefore, it is recommended she continue to follow the cancer management and screening guidelines provided by her oncology and primary healthcare provider.   An  individual's cancer risk and medical management are not determined by genetic test results alone. Overall cancer risk assessment incorporates additional factors, including personal medical history, family history, and any available genetic information that may result in a personalized plan for cancer prevention and surveillance  RECOMMENDATIONS FOR FAMILY MEMBERS:  Individuals in this family might be at some increased risk of developing cancer, over the general population risk, simply due to the family history of cancer.  We recommended women in this family have a yearly mammogram beginning at age 24, or 57 years younger than the earliest onset of cancer, an annual clinical breast exam, and perform monthly breast self-exams. Women in this family should also have a gynecological exam as recommended by their primary provider. All family members should be referred for colonoscopy starting at age 3.  FOLLOW-UP: Lastly, we discussed with Sierra Perez that cancer genetics is a rapidly advancing field and it is possible that new genetic tests will be appropriate for her and/or her family members in the future. We encouraged her to remain in contact with cancer genetics on an annual basis so we can update her personal and family histories and let her know of advances in cancer genetics that may benefit this family.   Our contact number was provided. Sierra Perez questions were answered to her satisfaction, and she knows she is welcome to call us at anytime with additional questions or concerns.   Roma Kayser, [place], [facility] Licensed, Certified Genetic Counselor [email or web address]

## 2020-05-27 NOTE — Telephone Encounter (Signed)
Revealed negative genetic testing.  Discussed that we do not know why she has breast cancer or why there is cancer in the family. It could be due to a different gene that we are not testing, or maybe our current technology may not be able to pick something up.  It will be important for her to keep in contact with genetics to keep up with whether additional testing may be needed. 

## 2020-05-30 ENCOUNTER — Inpatient Hospital Stay (HOSPITAL_BASED_OUTPATIENT_CLINIC_OR_DEPARTMENT_OTHER): Payer: 59 | Admitting: Licensed Clinical Social Worker

## 2020-05-30 DIAGNOSIS — Z171 Estrogen receptor negative status [ER-]: Secondary | ICD-10-CM

## 2020-05-30 DIAGNOSIS — C50411 Malignant neoplasm of upper-outer quadrant of right female breast: Secondary | ICD-10-CM

## 2020-05-30 NOTE — Progress Notes (Signed)
[place] CSW Progress Note  [facility] representative met with patient via video visit to provide supportive counseling.  Patient was tearful during visit and expressed struggling significantly during this diagnosis which she did not experience during first diagnosis in 2014.  She is crying daily, anxious, having trouble focusing and sleeping, and not finding the point in doing certain things.  In particular, it has been difficult to think about having bilateral mastectomies and potentially being unable to get the type of reconstruction that she is interested in.  She is grieving this fact as well as the delay it might cause in having knee surgery (knee issues limiting her ability to do things like go outside and hike).  She has support from her parents, friends, and daughter, but does not want to burden any of them. CSW provided supportive listening and discussed grief related to diagnosis and surgery. Began processing thoughts and feelings around what pt is experiencing.    Patient is going to seek ongoing therapy (resources given) and is interested in potentially trying medication as well (provided name of psychiatrist).  CSW also gave information on [employer] which provides programs such as chair qigong which may be easier for patient with her knee issues.  Follow-up:  5/6 9am in-person    Christeen Douglas LCSW

## 2020-05-31 ENCOUNTER — Ambulatory Visit (HOSPITAL_BASED_OUTPATIENT_CLINIC_OR_DEPARTMENT_OTHER): Payer: 59 | Admitting: Obstetrics & Gynecology

## 2020-06-04 NOTE — Progress Notes (Signed)
RN successfully faxed pathology to Dr. Einar Pheasant at [phone] per patient request.

## 2020-06-05 ENCOUNTER — Other Ambulatory Visit: Payer: Self-pay

## 2020-06-05 ENCOUNTER — Ambulatory Visit: Payer: 59 | Admitting: Plastic Surgery

## 2020-06-05 ENCOUNTER — Encounter: Payer: Self-pay | Admitting: Plastic Surgery

## 2020-06-05 VITALS — BP 139/85 | HR 93 | Ht 68.0 in | Wt 240.6 lb

## 2020-06-05 DIAGNOSIS — C50411 Malignant neoplasm of upper-outer quadrant of right female breast: Secondary | ICD-10-CM | POA: Diagnosis not present

## 2020-06-05 DIAGNOSIS — Z171 Estrogen receptor negative status [ER-]: Secondary | ICD-10-CM | POA: Diagnosis not present

## 2020-06-05 NOTE — Progress Notes (Signed)
Referring Provider Rolm Bookbinder, MD [place],  [place] [address]   CC:  Chief Complaint  Patient presents with  . Advice Only      Sierra Perez is an 60 y.o. female.  HPI: Patient presents to discuss her options for breast reconstruction.  She has history of breast cancer on the right side about 8 years ago.  This was treated with neoadjuvant chemotherapy, lumpectomy and adjuvant radiation treatment.  She subsequently developed either new cancer recurrence on the right side and also has some suspicious areas on the left side and is planning bilateral mastectomies.  After her initial treatment she had a reduction on the right and a mastopexy/reduction for symmetry on the left.  She also has a somewhat ill-defined clotting disorder.  She has had DVTs in the past and has an IVC filter in place.  She is subsequently had DVTs while on Coumadin and Lovenox but has subsequently done well with Eliquis for the last several years.  She reports that she has never had a pulmonary embolus.  She is quite concerned about the emotional impact of bilateral mastectomy without reconstruction for her.  She has been to [facility] clinic to discuss free flap surgery and was told that she is not a candidate for free tissue transfer.  Allergies  Allergen Reactions  . Pollen Extract   . Succinylcholine   . Sulfa Antibiotics     Outpatient Encounter Medications as of 06/05/2020  Medication Sig  . albuterol (VENTOLIN HFA) 108 (90 Base) MCG/ACT inhaler SMARTSIG:1-2 Puff(s) By Mouth Every 4-6 Hours PRN  . anastrozole (ARIMIDEX) 1 MG tablet Take 1 tablet (1 mg total) by mouth daily.  . B Complex Vitamins (B COMPLEX 1 PO) vitamin B complex capsule  . Bacillus Coagulans-Inulin (PROBIOTIC) 1-250 BILLION-MG CAPS Probiotic  . Cholecalciferol (VITAMIN D-3 PO) Vitamin D3  . DULoxetine (CYMBALTA) 30 MG capsule Take 3 capsules (90 mg total) by mouth daily.  . DULoxetine (CYMBALTA) 60 MG capsule Take  60 mg by mouth daily.  [name] ELIQUIS 5 MG TABS tablet TAKE 1 TABLET(5 MG) BY MOUTH TWICE DAILY  . fluticasone (FLONASE) 50 MCG/ACT nasal spray Place into both nostrils daily.  [name] gabapentin (NEURONTIN) 600 MG tablet Take 600 mg by mouth 2 (two) times daily.  [name] ipratropium (ATROVENT) 0.03 % nasal spray Place 2 sprays into both nostrils 2 (two) times daily.  [name] levothyroxine (SYNTHROID) 50 MCG tablet Take 50 mcg by mouth every morning.  . pantoprazole (PROTONIX) 40 MG tablet Take 1 tablet (40 mg total) by mouth daily.  . [DISCONTINUED] valACYclovir (VALTREX) 500 MG tablet Take 1 tablet (500 mg total) by mouth 2 (two) times daily.   No facility-administered encounter medications on file as of 06/05/2020.     Past Medical History:  Diagnosis Date  . Anxiety   . Breast cancer ([place]) 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, her-2 positive, ER/PR negative  . Clotting disorder ([place])    testing was negative, h/o DVT while on treatment  . Complex regional pain syndrome i of right lower limb   . DVT (deep venous thrombosis) ([place])    started in 2015, multiple  . Family history of breast cancer   . Family history of colon cancer   . Fibroid   . Heart murmur   . HPV in female   . 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 IBS (irritable bowel syndrome)   . Personal history of  malignant neoplasm of breast   . Thyroid disease     Past Surgical History:  Procedure Laterality Date  . ABDOMINAL HYSTERECTOMY  2010   fibroids, h/o abnormal pap smears  . BREAST SURGERY Right 2015   right lumpectomy with reduction and lift  . DENTAL SURGERY    . FOOT SURGERY     dislocated toe  . [place] RESECTION  2014  . LAPAROSCOPIC OOPHERECTOMY Bilateral 2016  . REPLACEMENT TOTAL KNEE Left 2018   also had 3 prior arthroscopies  . VENA CAVA FILTER PLACEMENT  2017    Family History  Problem Relation Age of Onset  . Hypertension Mother   . Skin cancer Mother   . Diabetes Father   . Kidney  disease Father   . Dementia Father   . Factor VIII deficiency Father   . Bladder Cancer Maternal Grandmother        dx in late 81s; smoker  . Stroke Maternal Grandmother   . Lung cancer Maternal Grandfather   . Colon cancer Maternal Grandfather 2  . Thyroid cancer Maternal Grandfather        dx in his 83s  . Breast cancer Paternal Grandmother        dx in 55s  . Prostate cancer Other        MGMs brother    Social History   Social History Narrative  . Not on file  Denies tobacco use  Review of Systems General: Denies fevers, chills, weight loss CV: Denies chest pain, shortness of breath, palpitations  Physical Exam Vitals with BMI 06/05/2020 05/14/2020 03/07/2020  Height _0  _1  -  Weight 240 lbs 10 oz 237 lbs 8 oz 238 lbs  BMI 71.06 26.94 -  Systolic 854 627 035  Diastolic 85 83 72  Pulse 93 71 68    General:  No acute distress,  Alert and oriented, Non-Toxic, Normal speech and affect Breast: She has well-healed Wise pattern scars on both sides.  She is fairly symmetric.  She does not have much in the way of radiation skin changes on the right side that are obvious to me.  There is no fibrosis or firmness.  Her base width is about 15 cm  Assessment/Plan Patient presents to discuss breast reconstruction for planned upcoming bilateral mastectomies.  She is well aware agree that she is a high risk candidate.  Given that she is not a candidate for free tissue transfer we focused our discussion on implant-based reconstruction.  I discussed direct implant and staged implant-based reconstruction with a tissue expander.  The advantage of the direct implant in her case would be hopefully eliminating the need for a second operation.  I do believe she would be a candidate for a skin sparing mastectomy and either direct implant reconstruction or tissue expanders.  She seem to like the idea of the direct implant and that it would avoid the need to come on and off her blood thinners for a  second operation and avoid the need for a fill process.  I did explain that the history of radiation does ut her at an increased risk for reconstruction regardless of the path that she chooses.  Certainly in this setting I would not want to push the envelope when it comes to size if we elected to go direct implant and she seems okay with being quite a bit smaller than she is now is I expect that she is a D or double D cup at  this point.  I went over in detail the risks and benefits of implant-based reconstruction that include bleeding, infection, damage to surrounding structures and need for additional procedures.  We discussed the potential for wound healing complications that would require removal of the implant.  We discussed management of the implant down the line and the recommendation for monitoring and potential complications such as capsular contracture and rupture that can occur years after placement.  She is can to think this over and I will plan to talk with Dr. Donne Hazel and we will come up with a suitable plan for her.  Cindra Presume 06/05/2020, 2:36 PM

## 2020-06-06 ENCOUNTER — Encounter: Payer: Self-pay | Admitting: Plastic Surgery

## 2020-06-07 ENCOUNTER — Encounter: Payer: Self-pay | Admitting: Orthopaedic Surgery

## 2020-06-07 ENCOUNTER — Inpatient Hospital Stay: Payer: 59 | Attending: Licensed Clinical Social Worker | Admitting: Licensed Clinical Social Worker

## 2020-06-07 ENCOUNTER — Telehealth: Payer: Self-pay | Admitting: *Deleted

## 2020-06-07 ENCOUNTER — Encounter: Payer: Self-pay | Admitting: *Deleted

## 2020-06-07 ENCOUNTER — Ambulatory Visit: Payer: 59 | Attending: General Surgery | Admitting: Rehabilitation

## 2020-06-07 ENCOUNTER — Other Ambulatory Visit: Payer: Self-pay | Admitting: *Deleted

## 2020-06-07 ENCOUNTER — Encounter: Payer: Self-pay | Admitting: Rehabilitation

## 2020-06-07 ENCOUNTER — Other Ambulatory Visit: Payer: Self-pay | Admitting: General Surgery

## 2020-06-07 ENCOUNTER — Other Ambulatory Visit: Payer: Self-pay

## 2020-06-07 ENCOUNTER — Encounter: Payer: Self-pay | Admitting: Hematology and Oncology

## 2020-06-07 DIAGNOSIS — Z171 Estrogen receptor negative status [ER-]: Secondary | ICD-10-CM

## 2020-06-07 DIAGNOSIS — R293 Abnormal posture: Secondary | ICD-10-CM | POA: Diagnosis not present

## 2020-06-07 DIAGNOSIS — Z853 Personal history of malignant neoplasm of breast: Secondary | ICD-10-CM | POA: Diagnosis present

## 2020-06-07 DIAGNOSIS — C50411 Malignant neoplasm of upper-outer quadrant of right female breast: Secondary | ICD-10-CM

## 2020-06-07 MED ORDER — ENOXAPARIN SODIUM 100 MG/ML IJ SOSY: 100.0000 mg | PREFILLED_SYRINGE | Freq: Two times a day (BID) | INTRAMUSCULAR | 0 refills | Status: DC

## 2020-06-07 NOTE — Patient Instructions (Signed)

## 2020-06-07 NOTE — Progress Notes (Signed)
[facility] CSW Progress Note  Holiday representative met with patient to provide ongoing supportive counseling. Patient is doing better than last week but is mentally exhausted from a busy week with returning from [place], multiple appointments, and work.  Pt has good insight into her tendency to feel guilty for prioritizing herself and into "all or nothing" approach which can sometimes interfere with getting started on an activity that may help her feel better. CSW allowed patient space to process and began work on challenging some of pt's automatic responses.  Goal for pt this week is to pick one self-care activity to complete (can be doing 5 min of movement, taking a bath, writing, seeing friends, etc).  Pt is also deciding if she will keep her planned trip to the beach.  Patient has made an appointment with Krystal Eaton for ongoing counseling and an appointment with psychiatry.  No further visits will be scheduled with CSW for that reason, but patient may call as needed.    Christeen Douglas LCSW

## 2020-06-07 NOTE — Telephone Encounter (Signed)
Received call from pt requesting to speak with MD regarding surgery options with increased risk of blood clots.  RN will alert MD to further discuss with pt.

## 2020-06-07 NOTE — Progress Notes (Signed)
Pt scheduled for breast surgery Monday 07/08/20.  Per MD pt to stop Eliquis 3 days prior to surgery and bridge with Lovenox 100 mg subcu BID.  Pt to not take any type of blood thinner day of surgery and resume Eliquis day after surgery. Prescription for Lovenox 100 mg subcu BID x3 days sent to pharmacy on file and pt educated on administration instructions and verbalized understanding.     Pt also requesting recent pathology and office notes be sent to Dr. Einar Pheasant [phone]) for second opinion.  Records successfully faxed.

## 2020-06-07 NOTE — Therapy (Signed)
[place], [place], [address] Phone: [phone]   Fax:  [phone]  Physical Therapy Treatment  Patient Details  Name: Sierra Perez MRN: [id number] Date of Birth: [birth year] Referring Provider (PT): Dr. Donne Hazel   Encounter Date: 06/07/2020   PT End of Session - 06/07/20 0838    Visit Number 1    Number of Visits 2    Date for PT Re-Evaluation 08/30/20    Authorization Type UHC    Authorization - Visit Number 1    Authorization - Number of Visits 23    PT Start Time 0804    PT Stop Time 0832    PT Time Calculation (min) 28 min    Activity Tolerance Patient tolerated treatment well    Behavior During Therapy [facility] for tasks assessed/performed           Past Medical History:  Diagnosis Date  . Anxiety   . Breast cancer ([place]) 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, her-2 positive, ER/PR negative  . Clotting disorder ([place])    testing was negative, h/o DVT while on treatment  . Complex regional pain syndrome i of right lower limb   . DVT (deep venous thrombosis) ([place])    started in 2015, multiple  . Family history of breast cancer   . Family history of colon cancer   . Fibroid   . Heart murmur   . HPV in female   . 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 IBS (irritable bowel syndrome)   . Personal history of malignant neoplasm of breast   . Thyroid disease     Past Surgical History:  Procedure Laterality Date  . ABDOMINAL HYSTERECTOMY  2010   fibroids, h/o abnormal pap smears  . BREAST SURGERY Right 2015   right lumpectomy with reduction and lift  . DENTAL SURGERY    . FOOT SURGERY     dislocated toe  . [place] RESECTION  2014  . LAPAROSCOPIC OOPHERECTOMY Bilateral 2016  . REPLACEMENT TOTAL KNEE Left 2018   also had 3 prior arthroscopies  . VENA CAVA FILTER PLACEMENT  2017    There were no vitals filed for this visit.   Subjective Assessment -  06/07/20 0805    Subjective no problems after last occurrence no swelling    Pertinent History 2014 Rt breast cancer with lumpectomy with SLNB, radiation, and chemotherapy. Recurrence now with upcoming bil mastectomy with SLNB due to grade 2 ILC ER/PR positive.  Other history includes clotting disorder and DVT history.    Patient Stated Goals get information from providers    Currently in Pain? No/denies              [facility] PT Assessment - 06/07/20 0001      Assessment   Medical Diagnosis Rt breast cancer    Referring Provider (PT) Dr. Donne Hazel    Onset Date/Surgical Date 06/07/20    Hand Dominance Left    Prior Therapy no      Precautions   Precautions None      Balance Screen   Has the patient fallen in the past 6 months No    Has the patient had a decrease in activity level because of a fear of falling?  No    Is the patient reluctant to leave their home because of a fear of falling?  No      Home Ecologist residence  Living Arrangements Alone    Available Help at Discharge Family;Friend(s)      Prior Function   Level of Independence Independent    Vocation Full time employment    Vocation Requirements desk work    Leisure not currently      Cognition   Overall Cognitive Status Within Functional Limits for tasks assessed      Posture/Postural Control   Posture/Postural Control Postural limitations    Postural Limitations Rounded Shoulders;Forward head      ROM / Strength   AROM / PROM / Strength AROM      AROM   AROM Assessment Site Shoulder    Right/Left Shoulder Right;Left    Right Shoulder ABduction 162 Degrees    Left Shoulder Extension 70 Degrees    Left Shoulder Flexion 155 Degrees    Left Shoulder ABduction 149 Degrees    Left Shoulder External Rotation 100 Degrees             LYMPHEDEMA/ONCOLOGY QUESTIONNAIRE - 06/07/20 0001      Type   Cancer Type Rt breast      Surgeries   Lumpectomy Date 02/03/12     Sentinel Lymph Node Biopsy Date 02/03/12    Number Lymph Nodes Removed 1      Treatment   Active Chemotherapy Treatment No    Past Chemotherapy Treatment Yes    Active Radiation Treatment No    Past Radiation Treatment Yes      Lymphedema Assessments   Lymphedema Assessments Upper extremities      Right Upper Extremity Lymphedema   10 cm Proximal to Olecranon Process 36 cm    Olecranon Process 27 cm    10 cm Proximal to Ulnar Styloid Process 23.5 cm    Just Proximal to Ulnar Styloid Process 17 cm    Across Hand at [employer] 19 cm    At [place] of 2nd Digit 6.7 cm      Left Upper Extremity Lymphedema   10 cm Proximal to Olecranon Process 37 cm    Olecranon Process 29 cm    10 cm Proximal to Ulnar Styloid Process 23.7 cm    Just Proximal to Ulnar Styloid Process 17 cm    Across Hand at [employer] 19.5 cm    At [place] of 2nd Digit 6.5 cm           L-DEX FLOWSHEETS - 06/07/20 0800      L-DEX LYMPHEDEMA SCREENING   Measurement Type Unilateral    L-DEX MEASUREMENT EXTREMITY Upper Extremity    POSITION  Standing    DOMINANT SIDE Left    At Risk Side Right    BASELINE SCORE (UNILATERAL) 5.9                             PT Education - 06/07/20 0837    Education Details post op care, exercises, POC, lymphedema briefly, SOZO    Person(s) Educated Patient    Methods Explanation;Demonstration    Comprehension Verbalized understanding;Verbal cues required               PT Long Term Goals - 06/07/20 0843      PT LONG TERM GOAL #1   Title Pt will return to close to baseline shoulder AROM to demonstrate no additional need for PT    Time 12    Period Weeks    Status New      PT LONG  TERM GOAL #2   Title Pt will be educated on lymphedema risk reduction and sleeve use    Time 12    Period Weeks    Status New      PT LONG TERM GOAL #3   Title Pt will be scheduled for SOZO surveillance    Time 12    Period [facility] Goals - 06/07/20 0843      Patient will be able to verbalize understanding of pertinent lymphedema risk reduction practices relevant to her diagnosis specifically related to skin care.   Status Achieved      Patient will be able to return demonstrate and/or verbalize understanding of the post-op home exercise program related to regaining shoulder range of motion.   Status Achieved      Patient will be able to verbalize understanding of the importance of attending the postoperative After Breast Cancer Class for further lymphedema risk reduction education and therapeutic exercise.   Status Achieved                Plan - 06/07/20 0839    Clinical Impression Statement Pt presents for baseline assessment prior to bilateral mastectomy and SLNB R only vs bil due to Rt breast cancer recurrence.  Pt has had previous lump, radiation, chemotherapy on the Rt side but with no current limitations per pt.  Pt will be seen 4 weeks post operatively for re-assessment but pt would like to save as many PT visits for her upcoming TKR as she reported needing alot of PT after the left TKR.    Personal Factors and Comorbidities Age;Comorbidity 2    Comorbidities previous radiation and SLNB    Stability/Clinical Decision Making Stable/Uncomplicated    Clinical Decision Making Low    Rehab Potential Excellent    PT Frequency --   post op only with more as needed   PT Duration 12 weeks    PT Treatment/Interventions ADLs/Self Care Home Management;Therapeutic exercise;Patient/family education;Manual techniques    PT Next Visit Plan post op assessment, any more visits or just ABC class? schedule SOZO follow up    PT Home Exercise Plan post op    Consulted and Agree with Plan of Care Patient           Patient will benefit from skilled therapeutic intervention in order to improve the following deficits and impairments:  Decreased knowledge of precautions,Postural dysfunction  Visit  Diagnosis: Abnormal posture  History of malignant neoplasm of right breast     Problem List Patient Active Problem List   Diagnosis Date Noted  . Genetic testing 05/27/2020  . Family history of breast cancer   . Family history of colon cancer   . Personal history of malignant neoplasm of breast   . Unilateral primary osteoarthritis, right knee 02/26/2020  . Unilateral primary osteoarthritis, 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  . 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 DVT (deep venous thrombosis) ([place]) 05/04/2018  . Malignant neoplasm of upper-outer quadrant of right breast in female, estrogen receptor negative ([place]) 05/03/2018    Stark Bray 06/07/2020, 8:45 AM  [place], [place], [address] Phone: [phone]   Fax:  [phone]  Name: Sierra Perez MRN: [id number] Date of Birth: February 09, 1960

## 2020-06-10 ENCOUNTER — Ambulatory Visit (HOSPITAL_BASED_OUTPATIENT_CLINIC_OR_DEPARTMENT_OTHER): Payer: 59 | Admitting: Obstetrics & Gynecology

## 2020-06-10 ENCOUNTER — Ambulatory Visit: Payer: 59

## 2020-06-11 ENCOUNTER — Telehealth: Payer: Self-pay | Admitting: Hematology and Oncology

## 2020-06-11 ENCOUNTER — Encounter (HOSPITAL_BASED_OUTPATIENT_CLINIC_OR_DEPARTMENT_OTHER): Payer: Self-pay | Admitting: Obstetrics & Gynecology

## 2020-06-11 ENCOUNTER — Ambulatory Visit (INDEPENDENT_AMBULATORY_CARE_PROVIDER_SITE_OTHER): Payer: 59 | Admitting: Obstetrics & Gynecology

## 2020-06-11 ENCOUNTER — Other Ambulatory Visit: Payer: Self-pay

## 2020-06-11 VITALS — BP 134/85 | HR 79 | Ht 67.0 in | Wt 238.0 lb

## 2020-06-11 DIAGNOSIS — Z01419 Encounter for gynecological examination (general) (routine) without abnormal findings: Secondary | ICD-10-CM

## 2020-06-11 DIAGNOSIS — N952 Postmenopausal atrophic vaginitis: Secondary | ICD-10-CM

## 2020-06-11 DIAGNOSIS — Z780 Asymptomatic menopausal state: Secondary | ICD-10-CM

## 2020-06-11 DIAGNOSIS — B009 Herpesviral infection, unspecified: Secondary | ICD-10-CM

## 2020-06-11 DIAGNOSIS — Z90710 Acquired absence of both cervix and uterus: Secondary | ICD-10-CM | POA: Insufficient documentation

## 2020-06-11 DIAGNOSIS — I82599 Chronic embolism and thrombosis of other specified deep vein of unspecified lower extremity: Secondary | ICD-10-CM | POA: Diagnosis not present

## 2020-06-11 DIAGNOSIS — B977 Papillomavirus as the cause of diseases classified elsewhere: Secondary | ICD-10-CM

## 2020-06-11 MED ORDER — VALACYCLOVIR HCL 500 MG PO TABS: 500.0000 mg | ORAL_TABLET | Freq: Every day | ORAL | 12 refills | Status: DC

## 2020-06-11 NOTE — Progress Notes (Signed)
60 y.o. G55P1001 Single White or Caucasian female here for annual exam.  Has been diagnosed with new lobar breast cancer.  Is going to have bilateral mastectomy with Dr. Donne Hazel and then implant placement with Dr. Claudia Desanctis.  This will be done same day, hopefully.  Will be on lovenox for three days pre-procedure.  She is seeing Dr. Lindi Adie.  Will not have post op radiation or chemotherapy.  This has been really hard for her.  She realizes she was more attached to this part of her anatomy then she realized.  Is seeing a psychiatrist and therapist this week.  Had planned knee surgery but will wait until after breast treatment is done.  Denies vaginal bleeding.  Not sexually active.  Not using vaginal Vit E.  No LMP recorded. Patient has had a hysterectomy.          Sexually active: No.  The current method of family planning is status post hysterectomy.    Exercising: not currently Smoker:  no  Health Maintenance: Pap:  02/2019, neg with neg HR HPV History of abnormal Pap:  History of HPV MMG:  04/2020 Colonoscopy:  2022, follow up 3 years due to not being fully clear BMD:   2016 TDaP:  2019 Shingrix:   completed Hep C testing: negative in the past per today Screening Labs: Dr. Lysle Rubens   reports that she has never smoked. She has never used smokeless tobacco. She reports current alcohol use. She reports that she does not use drugs.  Past Medical History:  Diagnosis Date  . Anxiety   . Breast cancer ([place]) 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, her-2 positive, ER/PR negative  . Clotting disorder ([place])    testing was negative, h/o DVT while on treatment  . Complex regional pain syndrome i of right lower limb   . DVT (deep venous thrombosis) ([place])    started in 2015, multiple  . Family history of breast cancer   . Family history of colon cancer   . Fibroid   . Heart murmur   . HPV in female   . 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 IBS (irritable bowel syndrome)   . Personal  history of malignant neoplasm of breast   . Thyroid disease     Past Surgical History:  Procedure Laterality Date  . ABDOMINAL HYSTERECTOMY  2010   fibroids, h/o abnormal pap smears  . BREAST SURGERY Right 2015   right lumpectomy with reduction and lift  . DENTAL SURGERY    . FOOT SURGERY     dislocated toe  . [place] RESECTION  2014  . LAPAROSCOPIC OOPHERECTOMY Bilateral 2016  . REPLACEMENT TOTAL KNEE Left 2018   also had 3 prior arthroscopies  . VENA CAVA FILTER PLACEMENT  2017    Current Outpatient Medications  Medication Sig Dispense Refill  . albuterol (VENTOLIN HFA) 108 (90 Base) MCG/ACT inhaler SMARTSIG:1-2 Puff(s) By Mouth Every 4-6 Hours PRN    . amLODipine (NORVASC) 5 MG tablet Take 1 tablet by mouth daily.    [name] anastrozole (ARIMIDEX) 1 MG tablet Take 1 tablet (1 mg total) by mouth daily. 90 tablet 3  . B Complex Vitamins (B COMPLEX 1 PO) vitamin B complex capsule    . Bacillus Coagulans-Inulin (PROBIOTIC) 1-250 BILLION-MG CAPS Probiotic    . Cholecalciferol (VITAMIN D-3 PO) Vitamin D3    . DULoxetine (CYMBALTA) 30 MG capsule Take 3 capsules (90 mg total) by mouth daily.    [name]  DULoxetine (CYMBALTA) 60 MG capsule Take 60 mg by mouth daily.    [name] ELIQUIS 5 MG TABS tablet TAKE 1 TABLET(5 MG) BY MOUTH TWICE DAILY 180 tablet 0  . fluticasone (FLONASE) 50 MCG/ACT nasal spray Place into both nostrils daily.    [name] gabapentin (NEURONTIN) 600 MG tablet Take 600 mg by mouth 2 (two) times daily.    [name] ipratropium (ATROVENT) 0.03 % nasal spray Place 2 sprays into both nostrils 2 (two) times daily.    [name] levothyroxine (SYNTHROID) 50 MCG tablet Take 50 mcg by mouth every morning.    . pantoprazole (PROTONIX) 40 MG tablet Take 1 tablet (40 mg total) by mouth daily.    [name] amLODipine (NORVASC) 5 MG tablet 1 tablet (Patient not taking: Reported on 06/11/2020)    . enoxaparin (LOVENOX) 100 MG/ML injection Inject 1 mL (100 mg total) into the skin every 12 (twelve) hours.  (Patient not taking: Reported on 06/11/2020) 6 mL 0   No current facility-administered medications for this visit.    Family History  Problem Relation Age of Onset  . Hypertension Mother   . Skin cancer Mother   . Diabetes Father   . Kidney disease Father   . Dementia Father   . Factor VIII deficiency Father   . Bladder Cancer Maternal Grandmother        dx in late 58s; smoker  . Stroke Maternal Grandmother   . Lung cancer Maternal Grandfather   . Colon cancer Maternal Grandfather 6  . Thyroid cancer Maternal Grandfather        dx in his 62s  . Breast cancer Paternal Grandmother        dx in 46s  . Prostate cancer Other        MGMs brother    Review of Systems  All other systems reviewed and are negative.   Exam:   BP 134/85   Pulse 79   Ht 5' 7"  (1.702 m)   Wt 238 lb (108 kg)   BMI 37.28 kg/m   Height: 5' 7"  (170.2 cm)  General appearance: alert, cooperative and appears stated age Head: Normocephalic, without obvious abnormality, atraumatic Neck: no adenopathy, supple, symmetrical, trachea midline and thyroid normal to inspection and palpation Lungs: clear to auscultation bilaterally Breasts: normal appearance, no masses or tenderness Heart: regular rate and rhythm Abdomen: soft, non-tender; bowel sounds normal; no masses,  no organomegaly Extremities: extremities normal, atraumatic, no cyanosis or edema Skin: Skin color, texture, turgor normal. No rashes or lesions Lymph nodes: Cervical, supraclavicular, and axillary nodes normal. No abnormal inguinal nodes palpated Neurologic: Grossly normal   Pelvic: External genitalia:  no lesions              Urethra:  normal appearing urethra with no masses, tenderness or lesions              Bartholins and Skenes: normal                 Vagina: normal appearing vagina with normal color and no discharge, no lesions              Cervix: absent              Pap taken: No. Bimanual Exam:  Uterus:  uterus absent               Adnexa: no mass, fullness, tenderness               Rectovaginal: Confirms  Anus:  normal sphincter tone, no lesions  Chaperone, Octaviano Batty , CMA, was present for exam.  Assessment/Plan: 1. Well woman exam with routine gynecological exam - pap neg with neg HR HPV 02/2019.   - MMG 04/2020 with new, second breast cancer diagnosis - colonoscopy 2022, follow up 3 years due to incomplete prep - BMD 2016 - vaccines updates - lab work done with Dr. Lysle Rubens  2. HSV-1 infection - RF for valtrex to pharmacy  3. 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 H/O total hysterectomy, then later BSO  5. Postmenopausal - cannot be on estrogen.  No HRT.  6. High risk HPV infection  7. Vaginal atrophy

## 2020-06-11 NOTE — Telephone Encounter (Signed)
Scheduled appt per 5/6 sch msg. PT aware.

## 2020-06-12 ENCOUNTER — Encounter: Payer: Self-pay | Admitting: Surgical

## 2020-06-12 NOTE — Progress Notes (Signed)
Surgical Clearance has been received from Dr. Lysle Rubens for patient's upcoming surgery with Dr. Claudia Desanctis .  Pt is medically cleared

## 2020-06-12 NOTE — Progress Notes (Signed)
Surgical Clearance has been received from Dr. Lindi Adie for patient's upcoming surgery bilateral breast reconstruction after mastectomy with Dr. Claudia Desanctis. Medications to hold prior to surgery Eliquis (Stop taking: 07/05/2020 begin retaking 07/09/2020)   Called patient and informed her.  She reports that she has also been provided Lovenox by Dr. Lindi Adie.

## 2020-06-13 ENCOUNTER — Encounter: Payer: Self-pay | Admitting: Hematology and Oncology

## 2020-06-14 ENCOUNTER — Encounter: Payer: Self-pay | Admitting: *Deleted

## 2020-06-18 ENCOUNTER — Ambulatory Visit: Payer: 59 | Attending: Internal Medicine

## 2020-06-18 ENCOUNTER — Other Ambulatory Visit (HOSPITAL_BASED_OUTPATIENT_CLINIC_OR_DEPARTMENT_OTHER): Payer: Self-pay

## 2020-06-18 ENCOUNTER — Other Ambulatory Visit: Payer: Self-pay

## 2020-06-18 DIAGNOSIS — Z23 Encounter for immunization: Secondary | ICD-10-CM

## 2020-06-18 MED ORDER — COVID-19 MRNA VACC (MODERNA) 100 MCG/0.5ML IM SUSP
INTRAMUSCULAR | 0 refills | Status: DC
  Filled 2020-06-18: qty 0.25, 1d supply, fill #0

## 2020-06-19 ENCOUNTER — Other Ambulatory Visit: Payer: Self-pay

## 2020-06-20 ENCOUNTER — Ambulatory Visit (INDEPENDENT_AMBULATORY_CARE_PROVIDER_SITE_OTHER): Payer: 59

## 2020-06-20 ENCOUNTER — Other Ambulatory Visit: Payer: Self-pay

## 2020-06-20 VITALS — BP 123/71 | HR 79 | Ht 68.0 in | Wt 238.0 lb

## 2020-06-20 DIAGNOSIS — C50411 Malignant neoplasm of upper-outer quadrant of right female breast: Secondary | ICD-10-CM

## 2020-06-20 DIAGNOSIS — Z171 Estrogen receptor negative status [ER-]: Secondary | ICD-10-CM

## 2020-06-28 ENCOUNTER — Ambulatory Visit: Payer: Managed Care, Other (non HMO)

## 2020-06-28 NOTE — Progress Notes (Signed)
Surgical Instructions    Your procedure is scheduled on June 6  Report to [facility] Main Entrance "A" at 0530 A.M., then check in with the Admitting office.  Call this number if you have problems the morning of surgery:  [phone]   If you have any questions prior to your surgery date call [phone]: Open Monday-Friday 8am-4pm    Remember:  Do not eat after midnight the night before your surgery  You may drink clear liquids until 0430am  the morning of your surgery.   Clear liquids allowed are: Water, Non-Citrus Juices (without pulp), Carbonated Beverages, Clear Tea, Black Coffee Only, and Gatorade  Please complete your PRE-SURGERY ENSURE that was provided to you by 0430 am the morning of surgery.  Please, if able, drink it in one setting. DO NOT SIP.     Take these medicines the morning of surgery with A SIP OF WATER  albuterol (VENTOLIN HFA) , Please bring all inhalers with you the day of surgery.  amLODipine (NORVASC)  anastrozole (ARIMIDEX DULoxetine (CYMBALTA fluticasone (FLONASE) ipratropium (ATROVENT) if needed pantoprazole (PROTONIX) valACYclovir (VALTREX)  Follow instructions about Lovonox Bridge  Follow your surgeon's instructions on when to stop ELIQUIS.  If no instructions were given by your surgeon then you will need to call the office to get those instructions.     As of today, STOP taking any Aspirin (unless otherwise instructed by your surgeon) Aleve, Naproxen, Ibuprofen, Motrin, Advil, Goody's, BC's, all herbal medications, fish oil, and all vitamins.                     Do not wear jewelry, make up, or nail polish DO Not wear nail polish, gel polish, artificial nails, or any other type of covering on natural nails including finger and toenails. If patients have artificial nails, gel coating, etc. that need to be removed by a nail saloon please have this removed prior to surgery or surgery may need to be canceled/delayed if the surgeon/ anesthesia feels  like the patient is unable to be adequately monitored.            Do not wear lotions, powders, perfumes/colognes, or deodorant.            Do not shave 48 hours prior to surgery.  Men may shave face and neck.            Do not bring valuables to the hospital.            [facility] is not responsible for any belongings or valuables.  Do NOT Smoke (Tobacco/Vaping) or drink Alcohol 24 hours prior to your procedure If you use a CPAP at night, you may bring all equipment for your overnight stay.   Contacts, glasses, dentures or bridgework may not be worn into surgery, please bring cases for these belongings   For patients admitted to the hospital, discharge time will be determined by your treatment team.   Patients discharged the day of surgery will not be allowed to drive home, and someone needs to stay with them for 24 hours.    Special instructions:    Oral Hygiene is also important to reduce your risk of infection.  Remember - BRUSH YOUR TEETH THE MORNING OF SURGERY WITH YOUR REGULAR TOOTHPASTE   [facility]- Preparing For Surgery  Before surgery, you can play an important role. Because skin is not sterile, your skin needs to be as free of germs as possible. You can reduce the number  of germs on your skin by washing with CHG (chlorahexidine gluconate) Soap before surgery.  CHG is an antiseptic cleaner which kills germs and bonds with the skin to continue killing germs even after washing.     Please do not use if you have an allergy to CHG or antibacterial soaps. If your skin becomes reddened/irritated stop using the CHG.  Do not shave (including legs and underarms) for at least 48 hours prior to first CHG shower. It is OK to shave your face.  Please follow these instructions carefully.    1.  Shower the NIGHT BEFORE SURGERY and the MORNING OF SURGERY with CHG Soap.   If you chose to wash your hair, wash your hair first as usual with your normal shampoo. After you shampoo, rinse  your hair and body thoroughly to remove the shampoo.  Then [employer] and genitals (private parts) with your normal soap and rinse thoroughly to remove soap.  2. After that Use CHG Soap as you would any other liquid soap. You can apply CHG directly to the skin and wash gently with a scrungie or a clean washcloth.   3. Apply the CHG Soap to your body ONLY FROM THE NECK DOWN.  Do not use on open wounds or open sores. Avoid contact with your eyes, ears, mouth and genitals (private parts). Wash Face and genitals (private parts)  with your normal soap.   4. Wash thoroughly, paying special attention to the area where your surgery will be performed.  5. Thoroughly rinse your body with warm water from the neck down.  6. DO NOT shower/wash with your normal soap after using and rinsing off the CHG Soap.  7. Pat yourself dry with a CLEAN TOWEL.  8. Wear CLEAN PAJAMAS to bed the night before surgery  9. Place CLEAN SHEETS on your bed the night before your surgery  10. DO NOT SLEEP WITH PETS.   Day of Surgery:  Take a shower with CHG soap. Wear Clean/Comfortable clothing the morning of surgery Do not apply any deodorants/lotions.   Remember to brush your teeth WITH YOUR REGULAR TOOTHPASTE.   Please read over the following fact sheets that you were given.

## 2020-07-02 ENCOUNTER — Other Ambulatory Visit: Payer: Self-pay

## 2020-07-02 ENCOUNTER — Encounter (HOSPITAL_COMMUNITY): Payer: Self-pay

## 2020-07-02 ENCOUNTER — Other Ambulatory Visit: Payer: Self-pay | Admitting: Hematology and Oncology

## 2020-07-02 ENCOUNTER — Encounter (HOSPITAL_COMMUNITY)
Admission: RE | Admit: 2020-07-02 | Discharge: 2020-07-02 | Disposition: A | Discharge status: Home / Self Care | Payer: 59 | Source: Ambulatory Visit | Attending: General Surgery | Admitting: General Surgery

## 2020-07-02 DIAGNOSIS — Z01812 Encounter for preprocedural laboratory examination: Secondary | ICD-10-CM | POA: Diagnosis present

## 2020-07-02 HISTORY — DX: Nonrheumatic mitral (valve) prolapse: I34.1

## 2020-07-02 HISTORY — DX: Hypothyroidism, unspecified: E03.9

## 2020-07-02 HISTORY — DX: Unspecified asthma, uncomplicated: J45.909

## 2020-07-02 LAB — CBC
HCT: 36.2 % (ref 36.0–46.0)
Hemoglobin: 11.7 g/dL — ABNORMAL LOW (ref 12.0–15.0)
MCH: 28.3 pg (ref 26.0–34.0)
MCHC: 32.3 g/dL (ref 30.0–36.0)
MCV: 87.4 fL (ref 80.0–100.0)
Platelets: 254 10*3/uL (ref 150–400)
RBC: 4.14 MIL/uL (ref 3.87–5.11)
RDW: 14.3 % (ref 11.5–15.5)
WBC: 6.9 10*3/uL (ref 4.0–10.5)
nRBC: 0 % (ref 0.0–0.2)

## 2020-07-02 LAB — BASIC METABOLIC PANEL
Anion gap: 6 (ref 5–15)
BUN: 16 mg/dL (ref 6–20)
CO2: 27 mmol/L (ref 22–32)
Calcium: 9.2 mg/dL (ref 8.9–10.3)
Chloride: 102 mmol/L (ref 98–111)
Creatinine, Ser: 0.7 mg/dL (ref 0.44–1.00)
GFR, Estimated: >60 mL/min (ref >60)
Glucose, Bld: 157 mg/dL — ABNORMAL HIGH (ref 70–99)
Potassium: 4.2 mmol/L (ref 3.5–5.1)
Sodium: 135 mmol/L (ref 135–145)

## 2020-07-02 LAB — HEMOGLOBIN A1C
Hgb A1c MFr Bld: 7.1 % — ABNORMAL HIGH (ref 4.8–5.6)
Mean Plasma Glucose: 157 mg/dL

## 2020-07-02 LAB — GLUCOSE, CAPILLARY: Glucose-Capillary: 162 mg/dL — ABNORMAL HIGH (ref 70–99)

## 2020-07-02 NOTE — Progress Notes (Signed)
PCP - Wenda Low Cardiologist - denies Dr Lindi Adie manages Eliquis  Chest x-ray - not needed EKG - 07/02/20 Stress Test - >20 years - patient had chest pain at time - patients stated everything checked out ok ECHO - > 20 years - dx with mitral valve prolapse Cardiac Cath - denies   Pre-diabetic  Doesn't check at home  CPAP - has but doesn't use Sleep Study- 2018-2019 in [facility]  Sees Dr Maxwell Caul now here  Blood Thinner Instructions: Eliquis LD 07/04/20 Aspirin Instructions: not needed  ERAS Protcol -yes clears until 0430 PRE-SURGERY Ensure or G2- ensure given  COVID TEST- 07/04/20   Anesthesia review: yes allergy to Succinylcholine  Patient denies shortness of breath, fever, cough and chest pain at PAT appointment   All instructions explained to the patient, with a verbal understanding of the material. Patient agrees to go over the instructions while at home for a better understanding. Patient also instructed to self quarantine after being tested for COVID-19. The opportunity to ask questions was provided.

## 2020-07-02 NOTE — Progress Notes (Signed)
Anesthesia Chart Review:  Follows with hematologist/oncologist Dr. Lindi Adie for history of breast cancer s/p neoadjuvant chemotherapy with [place] followed by 1 year of Herceptin maintenance, right breast lumpectomy 02/13/2013.  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  Previously had DVT on Coumadin.  She has now maintained on Eliquis and has done well on this.  Recently discovered to have recurrence/relapse of breast cancer.  Anticoagulation was addressed in progress note by Heinz Knuckles, RN on 06/07/2020 stating, "Pt scheduled for breast surgery Monday 07/08/20.  Per MD pt to stop Eliquis 3 days prior to surgery and bridge with Lovenox 100 mg subcu BID.  Pt to not take any type of blood thinner day of surgery and resume Eliquis day after surgery. Prescription for Lovenox 100 mg subcu BID x3 days sent to pharmacy on file and pt educated on administration instructions and verbalized understanding."  Patient has reported allergy to succinylcholine.  She told preop RN that she had "muscle soreness" following surgery approximately 18 years ago.  She has had multiple surgeries since that time.  Records in care everywhere reviewed.  Previous anesthesia notes report that patient had difficulty with muscle movement following surgery and it was felt possibly due to succinylcholine.  Anesthesia records available in Care Everywhere show that patient has received either rocuronium or vecuronium for subsequent intubations.  I called and spoke with the patient clarify further.  She states that she was having multiple endoscopies ~20 years ago for evaluation of polyps and at one point was referred to a tertiary hospital for specific procedure and received succinylcholine at that time.  She reports she did okay immediately postoperatively but on postop day 1 at home she noticed profound muscle weakness and fatigue.  When she reported this, she said she was told that it may have  been due to succinylcholine as that was the only anesthetic medication that she had not previously received.  She denies any muscle rigidity or fevers.  The symptoms did resolve on her own.  The patient does not believe she has ever received succinylcholine again since that initial event. She has never had any additional testing or workup for this.  Hx of OSA on CPAP.  Patient reports noncompliance, although she is trying to be better about this.  Preop labs reviewed, unremarkable.  EKG 07/02/2020: NSR.  Rate 67.  Low voltage QRS.  Cardio oncology Limited echo 08/30/2013 (Care Everywhere): - Left ventricle: The cavity size was normal. Wall thickness  was normal. Systolic function was normal. The estimated  ejection fraction was 57%. Wall motion was normal; there  were no regional wall motion abnormalities. Left  ventricular diastolic function parameters were normal. The  global longitudinal strain was -22%.Ejection fraction  (EF) was calculated using 3d full volume imaging method.  Impressions: No significant change in LVEF or GLS compared  with March 2015    TTE 01/09/2013 (Care Everywhere): - Left ventricle: The cavity size was normal. Wall thickness  was normal. Systolic function was normal. The estimated  ejection fraction was in the range of 55% to 60%. Wall  motion was normal; there were no regional wall motion  abnormalities. Left ventricular diastolic function  parameters were normal. Aortic valve: Mildly thickened  leaflets. Mitral valve: Mildly thickened leaflets. Left  atrium: The atrium was mildly dilated.    Wynonia Musty [facility] [address] Short Stay Center/Anesthesiology Phone [phone] 07/04/2020 2:51 PM

## 2020-07-04 ENCOUNTER — Encounter (HOSPITAL_BASED_OUTPATIENT_CLINIC_OR_DEPARTMENT_OTHER): Payer: Self-pay | Admitting: Obstetrics & Gynecology

## 2020-07-04 ENCOUNTER — Other Ambulatory Visit (HOSPITAL_COMMUNITY)
Admission: RE | Admit: 2020-07-04 | Discharge: 2020-07-04 | Disposition: A | Discharge status: Home / Self Care | Payer: 59 | Source: Ambulatory Visit | Attending: General Surgery | Admitting: General Surgery

## 2020-07-04 DIAGNOSIS — Z20822 Contact with and (suspected) exposure to covid-19: Secondary | ICD-10-CM | POA: Insufficient documentation

## 2020-07-04 DIAGNOSIS — Z01812 Encounter for preprocedural laboratory examination: Secondary | ICD-10-CM | POA: Insufficient documentation

## 2020-07-04 LAB — SARS CORONAVIRUS 2 (TAT 6-24 HRS): SARS Coronavirus 2: NEGATIVE

## 2020-07-04 NOTE — Anesthesia Preprocedure Evaluation (Addendum)
Anesthesia Evaluation  Patient identified by MRN, date of birth, ID band Patient awake    Reviewed: Allergy & Precautions, NPO status , Patient's Chart, lab work & pertinent test results  History of Anesthesia Complications Negative for: history of anesthetic complications  Airway Mallampati: II  TM Distance: >3 FB Neck ROM: Full    Dental  (+) Dental Advisory Given, Teeth Intact   Pulmonary asthma , sleep apnea and Continuous Positive Airway Pressure Ventilation ,    Pulmonary exam normal        Cardiovascular hypertension, Pt. on medications + DVT  Normal cardiovascular exam     Neuro/Psych Anxiety negative neurological ROS     GI/Hepatic Neg liver ROS, GERD  Medicated,  Endo/Other  Hypothyroidism   Renal/GU negative Renal ROS  negative genitourinary   Musculoskeletal negative musculoskeletal ROS (+)   Abdominal   Peds  Hematology  (+) anemia , Eliquis   Anesthesia Other Findings  Breast cancer  Reproductive/Obstetrics                            Anesthesia Physical Anesthesia Plan  ASA: III  Anesthesia Plan: General   Post-op Pain Management:    Induction: Intravenous  PONV Risk Score and Plan: 3 and Ondansetron, Dexamethasone, Treatment may vary due to age or medical condition and Midazolam  Airway Management Planned: Oral ETT  Additional Equipment: None  Intra-op Plan:   Post-operative Plan: Extubation in OR  Informed Consent: I have reviewed the patients History and Physical, chart, labs and discussed the procedure including the risks, benefits and alternatives for the proposed anesthesia with the patient or authorized representative who has indicated his/her understanding and acceptance.     Dental advisory given  Plan Discussed with:   Anesthesia Plan Comments: (PAT note by Karoline Caldwell, PA-C: Follows with hematologist/oncologist Dr. Lindi Adie for history of  breast cancer s/p neoadjuvant chemotherapy with [place] followed by 1 year of Herceptin maintenance, right breast lumpectomy 02/13/2013.  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  Previously had DVT on Coumadin.  She has now maintained on Eliquis and has done well on this.  Recently discovered to have recurrence/relapse of breast cancer.  Anticoagulation was addressed in progress note by Heinz Knuckles, RN on 06/07/2020 stating, "Pt scheduled for breast surgery Monday 07/08/20.  Per MD pt to stop Eliquis 3 days prior to surgery and bridge with Lovenox 100 mg subcu BID.  Pt to not take any type of blood thinner day of surgery and resume Eliquis day after surgery. Prescription for Lovenox 100 mg subcu BID x3 days sent to pharmacy on file and pt educated on administration instructions and verbalized understanding."  Patient has reported allergy to succinylcholine.  She told preop RN that she had "muscle soreness" following surgery approximately 18 years ago.  She has had multiple surgeries since that time.  Records in care everywhere reviewed.  Previous anesthesia notes report that patient had difficulty with muscle movement following surgery and it was felt possibly due to succinylcholine.  Anesthesia records available in Care Everywhere show that patient has received either rocuronium or vecuronium for subsequent intubations.  I called and spoke with the patient clarify further.  She states that she was having multiple endoscopies ~20 years ago for evaluation of polyps and at one point was referred to a tertiary hospital for specific procedure and received succinylcholine at that time.  She reports she  did okay immediately postoperatively but on postop day 1 at home she noticed profound muscle weakness and fatigue.  When she reported this, she said she was told that it may have been due to succinylcholine as that was the only anesthetic medication that she had not  previously received.  She denies any muscle rigidity or fevers.  The symptoms did resolve on her own.  The patient does not believe she has ever received succinylcholine again since that initial event. She has never had any additional testing or workup for this.  Hx of OSA on CPAP.  Patient reports noncompliance, although she is trying to be better about this.  Preop labs reviewed, unremarkable.  EKG 07/02/2020: NSR.  Rate 67.  Low voltage QRS.  Cardio oncology Limited echo 08/30/2013 (Care Everywhere): - Left ventricle: The cavity size was normal. Wall thickness  was normal. Systolic function was normal. The estimated  ejection fraction was 57%. Wall motion was normal; there  were no regional wall motion abnormalities. Left  ventricular diastolic function parameters were normal. The  global longitudinal strain was -22%.Ejection fraction  (EF) was calculated using 3d full volume imaging method.  Impressions: No significant change in LVEF or GLS compared  with March 2015    TTE 01/09/2013 (Care Everywhere): - Left ventricle: The cavity size was normal. Wall thickness  was normal. Systolic function was normal. The estimated  ejection fraction was in the range of 55% to 60%. Wall  motion was normal; there were no regional wall motion  abnormalities. Left ventricular diastolic function  parameters were normal. Aortic valve: Mildly thickened  leaflets. Mitral valve: Mildly thickened leaflets. Left  atrium: The atrium was mildly dilated.   )       Anesthesia Quick Evaluation

## 2020-07-04 NOTE — Progress Notes (Signed)
Patient ID: Sierra Perez, female    DOB: 06-26-1960, 60 y.o.   MRN: [id number]  Chief Complaint  Patient presents with  . Pre-op Exam    No diagnosis found.  Right Breast Cancer   History of Present Illness: Sierra Perez is a 60 y.o.  female  with a history of right breast cancer   She presents for preoperative evaluation for upcoming procedure, Bilateral Breast Reconstruction with Tissue Expanders, Flex HD, & Possible Direct Implant Reconstruction by Dr. Claudia Desanctis on 07/08/20,  following Bilateral Total Mastectomies with Right Sentinel Node Bx by Dr Donne Hazel  The patient has had problems with anesthesia. -Allergy to Succinylcholine  Summary of Previous Visit: Consult for Breast Reconstruction options with Dr. Claudia Desanctis-  Per Dr. Claudia Desanctis:  he & patient agreed that if possible he may complete a Direct Implant Reconstruction to eliminate the need for 2nd surgery to remove tissue expanders/replace with implants d/t her significant medical hx. . Job: [employer]  PMH Significant for:  Patient has positive Hx of the following: Sleep apnea / Asthma DVT- Vena Cava filter placed in 2017 Patient's father had Factor VII - patient was tested  & was negative for this  Mitral Valve Prolapse /Murmur HTN Hypothyroidism Complex Regional Pain Syndrome- right lower extremity Gastric sleeve / IBS  Surgical Clearance received from [facility] on 06/12/20 via fax Per Dr. Lindi Adie- patient is cleared for surgery-she is to hold Eliquis on 07/05/20 & begin retaking it on 07/09/20 Matt, [place]- called & discussed this order with pt on 06/12/20 It was also noted by Catalina Antigua, PA that pt has RX for Lovenox/ injectionby Dr. Lindi Adie on 06/07/20 Copy of the above is scanned into chart   Past Medical History: Allergies: Allergies  Allergen Reactions  . Succinylcholine Other (See Comments)    Muscle aching  18 years ago from 07/02/20  . Sulfa Antibiotics Rash    And swelling generalized not in throat    Current  Medications:  Current Outpatient Medications:  .  albuterol (VENTOLIN HFA) 108 (90 Base) MCG/ACT inhaler, Inhale 1 puff into the lungs every 6 (six) hours as needed for wheezing or shortness of breath., Disp: , Rfl:  .  amLODipine (NORVASC) 5 MG tablet, Take 5 mg by mouth daily., Disp: , Rfl:  .  anastrozole (ARIMIDEX) 1 MG tablet, Take 1 tablet (1 mg total) by mouth daily., Disp: 90 tablet, Rfl: 3 .  Cholecalciferol (VITAMIN D-3) 25 MCG (1000 UT) CAPS, Take 1,000 Units by mouth daily., Disp: , Rfl:  .  DULoxetine (CYMBALTA) 30 MG capsule, Take 3 capsules (90 mg total) by mouth daily. (Patient taking differently: Take 30 mg by mouth daily. Take with 83m capsule to equal 976m, Disp: , Rfl:  .  DULoxetine (CYMBALTA) 60 MG capsule, Take 60 mg by mouth daily. Take with 3039mapsules to equal 42m61misp: , Rfl:  .  enoxaparin (LOVENOX) 100 MG/ML injection, Inject 1 mL (100 mg total) into the skin every 12 (twelve) hours., Disp: 6 mL, Rfl: 0 .  fluticasone (FLONASE) 50 MCG/ACT nasal spray, Place 1 spray into both nostrils daily., Disp: , Rfl:  .  gabapentin (NEURONTIN) 600 MG tablet, Take 600 mg by mouth 2 (two) times daily., Disp: , Rfl:  .  ipratropium (ATROVENT) 0.03 % nasal spray, Place 2 sprays into both nostrils daily., Disp: , Rfl:  .  levothyroxine (SYNTHROID) 50 MCG tablet, Take 50 mcg by mouth every morning., Disp: , Rfl:  .  pantoprazole (PROTONIX) 40  MG tablet, Take 1 tablet (40 mg total) by mouth daily., Disp:  , Rfl:  .  valACYclovir (VALTREX) 500 MG tablet, Take 1 tablet (500 mg total) by mouth daily., Disp: 30 tablet, Rfl: 12 .  CALCIUM CITRATE PO, Take 1 tablet by mouth daily., Disp: , Rfl:  .  COVID-19 mRNA vaccine, Moderna, 100 MCG/0.5ML injection, Inject into the muscle. (Patient not taking: No sig reported), Disp: 0.25 mL, Rfl: 0 .  ELIQUIS 5 MG TABS tablet, TAKE 1 TABLET(5 MG) BY MOUTH TWICE DAILY, Disp: 180 tablet, Rfl: 0 .  Lactobacillus Rhamnosus, GG, (CULTURELLE PO), Take 1  capsule by mouth daily., Disp: , Rfl:   Past Medical Problems: Past Medical History:  Diagnosis Date  . Anxiety   . Asthma   . Breast cancer ([place]) 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, her-2 positive, ER/PR negative  . Clotting disorder ([place])    testing was negative, h/o DVT while on treatment  . Complex regional pain syndrome i of right lower limb    RDS  . DVT (deep venous thrombosis) ([place])    started in 2015, multiple  . Family history of breast cancer   . Family history of colon cancer   . Fibroid   . Heart murmur   . HPV in female   . 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 Hypothyroidism   . IBS (irritable bowel syndrome)   . Mitral valve prolapse   . Personal history of malignant neoplasm of breast   . Thyroid disease     Past Surgical History: Past Surgical History:  Procedure Laterality Date  . ABDOMINAL HYSTERECTOMY  2010   fibroids, h/o abnormal pap smears  . BREAST SURGERY Right 2015   right lumpectomy with reduction and lift  . COLONOSCOPY    . DENTAL SURGERY    . ESOPHAGOGASTRODUODENOSCOPY    . FOOT SURGERY     dislocated toe  . [place] RESECTION  2014  . LAPAROSCOPIC OOPHERECTOMY Bilateral 2016  . REPLACEMENT TOTAL KNEE Left 2018   also had 3 prior arthroscopies  . VENA CAVA FILTER PLACEMENT  2017    Social History: Social History   Socioeconomic History  . Marital status: Single    Spouse name: Not on file  . Number of children: Not on file  . Years of education: Not on file  . Highest education level: Not on file  Occupational History  . Not on file  Tobacco Use  . Smoking status: Never Smoker  . Smokeless tobacco: Never Used  Vaping Use  . Vaping Use: Never used  Substance and Sexual Activity  . Alcohol use: Yes    Comment: occ  . Drug use: Not Currently    Types: Marijuana  . Sexual activity: Not Currently    Birth control/protection: Surgical    Comment: Hysterectomy  Other Topics Concern  . Not on  file  Social History Narrative  . Not on file   Social Determinants of Health   Financial Resource Strain: Not on file  Food Insecurity: Not on file  Transportation Needs: Not on file  Physical Activity: Not on file  Stress: Not on file  Social Connections: Not on file  Intimate Partner Violence: Not on file    Family History: Family History  Problem Relation Age of Onset  . Hypertension Mother   . Skin cancer Mother   . Diabetes Father   . Kidney disease Father   . Dementia Father   .  Factor VIII deficiency Father   . Bladder Cancer Maternal Grandmother        dx in late 69s; smoker  . Stroke Maternal Grandmother   . Lung cancer Maternal Grandfather   . Colon cancer Maternal Grandfather 35  . Thyroid cancer Maternal Grandfather        dx in his 87s  . Breast cancer Paternal Grandmother        dx in 22s  . Prostate cancer Other        MGMs brother    Review of Systems: ROS  Sleep apnea - CPAP Asthma- inhaler Hx of DVT-  Mitral Valve Prolapse Hx of Anxiety Total knee Replacement - left 2018   Physical Exam: Vital Signs BP 123/71 (BP Location: Left Arm, Patient Position: Sitting, Cuff Size: Large)   Pulse 79   Ht _0  (1.727 m)   Wt 238 lb (108 kg)   SpO2 99%   BMI 36.19 kg/m   Physical Exam  Constitutional:      General: Not in acute distress.    Appearance: Normal appearance. Not ill-appearing.  HENT:     Head: Normocephalic and atraumatic.  Eyes:     Pupils: Pupils are equal, round Neck:     Musculoskeletal: Normal range of motion.  Cardiovascular:     Rate and Rhythm: Normal rate    Pulses: Normal pulses.  Pulmonary:     Effort: Pulmonary effort is normal. No respiratory distress.  Abdominal:     General: Abdomen is flat. There is no distension.  Musculoskeletal: Normal range of motion.  Skin:    General: Skin is warm and dry.     Findings: No erythema or rash.  Neurological:     General: No focal deficit present.     Mental Status:  Alert and oriented to person, place, and time. Mental status is at baseline.     Motor: No weakness.  Psychiatric:        Mood and Affect: Mood normal.        Behavior: Behavior normal.    Assessment/Plan: The patient is scheduled for bilateral breast reconstruction with Dr. Claudia Desanctis.   Risks, benefits, and alternatives of procedure discussed, questions answered and consent obtained.    Smoking Status: does not smoke; Counseling Given? n/a Last Mammogram: 10/17/2019; Results: right breast cancer  Caprini Score: 13 ; Risk Factors include: Hx DVT , BMI = 36.37 and length of planned surgery. Recommendation for mechanical SCD   pharmacological prophylaxis. Patient has RX for Lovenox injection by Dr. Lindi Adie 06/07/20  Encourage early ambulation.   Pictures obtained: at consult  Post-op Rx sent to pharmacy: Dr. Claudia Desanctis  Patient was provided with the  General Surgical Risk/ Breast Surgery consent document and Pain Medication Agreement prior to their appointment.  They had adequate time to read through the risk consent documents and Pain Medication Agreement. We also discussed them in person together during this preop appointment. All of their questions were answered by myself & Dr. Claudia Desanctis to their satisfaction.  Recommended calling if they have any further questions.  Risk consent form and Pain Medication Agreement to be scanned into patient's chart.  The risk that can be encountered with breast surgery were discussed and include the following but not limited to these:  Breast asymmetry, fluid accumulation, firmness of the breast,   skin loss,   bleeding, infection, healing delay.  There are risks of anesthesia, changes to skin sensation and injury to nerves or blood vessels.  The  muscle can be temporarily or permanently injured.  You may have an allergic reaction to tape, suture, glue, blood products which can result in skin discoloration, swelling, pain, skin lesions, poor healing.  Any of these can lead to the  need for revisonal surgery or stage procedures.     Electronically signed by: [place], RN 07/04/2020 8:06 AM

## 2020-07-04 NOTE — Patient Instructions (Signed)
Patient has her surgery/Covid test/ & post-op schedule. She is reminded to call for any concerns/changes or further questions

## 2020-07-04 NOTE — H&P (View-Only) (Signed)
Patient ID: Sierra Perez, female    DOB: 06-26-1960, 60 y.o.   MRN: [id number]  Chief Complaint  Patient presents with  . Pre-op Exam    No diagnosis found.  Right Breast Cancer   History of Present Illness: Sierra Perez is a 60 y.o.  female  with a history of right breast cancer   She presents for preoperative evaluation for upcoming procedure, Bilateral Breast Reconstruction with Tissue Expanders, Flex HD, & Possible Direct Implant Reconstruction by Dr. Claudia Desanctis on 07/08/20,  following Bilateral Total Mastectomies with Right Sentinel Node Bx by Dr Donne Hazel  The patient has had problems with anesthesia. -Allergy to Succinylcholine  Summary of Previous Visit: Consult for Breast Reconstruction options with Dr. Claudia Desanctis-  Per Dr. Claudia Desanctis:  he & patient agreed that if possible he may complete a Direct Implant Reconstruction to eliminate the need for 2nd surgery to remove tissue expanders/replace with implants d/t her significant medical hx. . Job: [employer]  PMH Significant for:  Patient has positive Hx of the following: Sleep apnea / Asthma DVT- Vena Cava filter placed in 2017 Patient's father had Factor VII - patient was tested  & was negative for this  Mitral Valve Prolapse /Murmur HTN Hypothyroidism Complex Regional Pain Syndrome- right lower extremity Gastric sleeve / IBS  Surgical Clearance received from [facility] on 06/12/20 via fax Per Dr. Lindi Adie- patient is cleared for surgery-she is to hold Eliquis on 07/05/20 & begin retaking it on 07/09/20 Matt, [place]- called & discussed this order with pt on 06/12/20 It was also noted by Catalina Antigua, PA that pt has RX for Lovenox/ injectionby Dr. Lindi Adie on 06/07/20 Copy of the above is scanned into chart   Past Medical History: Allergies: Allergies  Allergen Reactions  . Succinylcholine Other (See Comments)    Muscle aching  18 years ago from 07/02/20  . Sulfa Antibiotics Rash    And swelling generalized not in throat    Current  Medications:  Current Outpatient Medications:  .  albuterol (VENTOLIN HFA) 108 (90 Base) MCG/ACT inhaler, Inhale 1 puff into the lungs every 6 (six) hours as needed for wheezing or shortness of breath., Disp: , Rfl:  .  amLODipine (NORVASC) 5 MG tablet, Take 5 mg by mouth daily., Disp: , Rfl:  .  anastrozole (ARIMIDEX) 1 MG tablet, Take 1 tablet (1 mg total) by mouth daily., Disp: 90 tablet, Rfl: 3 .  Cholecalciferol (VITAMIN D-3) 25 MCG (1000 UT) CAPS, Take 1,000 Units by mouth daily., Disp: , Rfl:  .  DULoxetine (CYMBALTA) 30 MG capsule, Take 3 capsules (90 mg total) by mouth daily. (Patient taking differently: Take 30 mg by mouth daily. Take with 83m capsule to equal 976m, Disp: , Rfl:  .  DULoxetine (CYMBALTA) 60 MG capsule, Take 60 mg by mouth daily. Take with 3039mapsules to equal 42m61misp: , Rfl:  .  enoxaparin (LOVENOX) 100 MG/ML injection, Inject 1 mL (100 mg total) into the skin every 12 (twelve) hours., Disp: 6 mL, Rfl: 0 .  fluticasone (FLONASE) 50 MCG/ACT nasal spray, Place 1 spray into both nostrils daily., Disp: , Rfl:  .  gabapentin (NEURONTIN) 600 MG tablet, Take 600 mg by mouth 2 (two) times daily., Disp: , Rfl:  .  ipratropium (ATROVENT) 0.03 % nasal spray, Place 2 sprays into both nostrils daily., Disp: , Rfl:  .  levothyroxine (SYNTHROID) 50 MCG tablet, Take 50 mcg by mouth every morning., Disp: , Rfl:  .  pantoprazole (PROTONIX) 40  MG tablet, Take 1 tablet (40 mg total) by mouth daily., Disp:  , Rfl:  .  valACYclovir (VALTREX) 500 MG tablet, Take 1 tablet (500 mg total) by mouth daily., Disp: 30 tablet, Rfl: 12 .  CALCIUM CITRATE PO, Take 1 tablet by mouth daily., Disp: , Rfl:  .  COVID-19 mRNA vaccine, Moderna, 100 MCG/0.5ML injection, Inject into the muscle. (Patient not taking: No sig reported), Disp: 0.25 mL, Rfl: 0 .  ELIQUIS 5 MG TABS tablet, TAKE 1 TABLET(5 MG) BY MOUTH TWICE DAILY, Disp: 180 tablet, Rfl: 0 .  Lactobacillus Rhamnosus, GG, (CULTURELLE PO), Take 1  capsule by mouth daily., Disp: , Rfl:   Past Medical Problems: Past Medical History:  Diagnosis Date  . Anxiety   . Asthma   . Breast cancer ([place]) 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, her-2 positive, ER/PR negative  . Clotting disorder ([place])    testing was negative, h/o DVT while on treatment  . Complex regional pain syndrome i of right lower limb    RDS  . DVT (deep venous thrombosis) ([place])    started in 2015, multiple  . Family history of breast cancer   . Family history of colon cancer   . Fibroid   . Heart murmur   . HPV in female   . 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 Hypothyroidism   . IBS (irritable bowel syndrome)   . Mitral valve prolapse   . Personal history of malignant neoplasm of breast   . Thyroid disease     Past Surgical History: Past Surgical History:  Procedure Laterality Date  . ABDOMINAL HYSTERECTOMY  2010   fibroids, h/o abnormal pap smears  . BREAST SURGERY Right 2015   right lumpectomy with reduction and lift  . COLONOSCOPY    . DENTAL SURGERY    . ESOPHAGOGASTRODUODENOSCOPY    . FOOT SURGERY     dislocated toe  . [place] RESECTION  2014  . LAPAROSCOPIC OOPHERECTOMY Bilateral 2016  . REPLACEMENT TOTAL KNEE Left 2018   also had 3 prior arthroscopies  . VENA CAVA FILTER PLACEMENT  2017    Social History: Social History   Socioeconomic History  . Marital status: Single    Spouse name: Not on file  . Number of children: Not on file  . Years of education: Not on file  . Highest education level: Not on file  Occupational History  . Not on file  Tobacco Use  . Smoking status: Never Smoker  . Smokeless tobacco: Never Used  Vaping Use  . Vaping Use: Never used  Substance and Sexual Activity  . Alcohol use: Yes    Comment: occ  . Drug use: Not Currently    Types: Marijuana  . Sexual activity: Not Currently    Birth control/protection: Surgical    Comment: Hysterectomy  Other Topics Concern  . Not on  file  Social History Narrative  . Not on file   Social Determinants of Health   Financial Resource Strain: Not on file  Food Insecurity: Not on file  Transportation Needs: Not on file  Physical Activity: Not on file  Stress: Not on file  Social Connections: Not on file  Intimate Partner Violence: Not on file    Family History: Family History  Problem Relation Age of Onset  . Hypertension Mother   . Skin cancer Mother   . Diabetes Father   . Kidney disease Father   . Dementia Father   .  Factor VIII deficiency Father   . Bladder Cancer Maternal Grandmother        dx in late 69s; smoker  . Stroke Maternal Grandmother   . Lung cancer Maternal Grandfather   . Colon cancer Maternal Grandfather 35  . Thyroid cancer Maternal Grandfather        dx in his 87s  . Breast cancer Paternal Grandmother        dx in 22s  . Prostate cancer Other        MGMs brother    Review of Systems: ROS  Sleep apnea - CPAP Asthma- inhaler Hx of DVT-  Mitral Valve Prolapse Hx of Anxiety Total knee Replacement - left 2018   Physical Exam: Vital Signs BP 123/71 (BP Location: Left Arm, Patient Position: Sitting, Cuff Size: Large)   Pulse 79   Ht _0  (1.727 m)   Wt 238 lb (108 kg)   SpO2 99%   BMI 36.19 kg/m   Physical Exam  Constitutional:      General: Not in acute distress.    Appearance: Normal appearance. Not ill-appearing.  HENT:     Head: Normocephalic and atraumatic.  Eyes:     Pupils: Pupils are equal, round Neck:     Musculoskeletal: Normal range of motion.  Cardiovascular:     Rate and Rhythm: Normal rate    Pulses: Normal pulses.  Pulmonary:     Effort: Pulmonary effort is normal. No respiratory distress.  Abdominal:     General: Abdomen is flat. There is no distension.  Musculoskeletal: Normal range of motion.  Skin:    General: Skin is warm and dry.     Findings: No erythema or rash.  Neurological:     General: No focal deficit present.     Mental Status:  Alert and oriented to person, place, and time. Mental status is at baseline.     Motor: No weakness.  Psychiatric:        Mood and Affect: Mood normal.        Behavior: Behavior normal.    Assessment/Plan: The patient is scheduled for bilateral breast reconstruction with Dr. Claudia Desanctis.   Risks, benefits, and alternatives of procedure discussed, questions answered and consent obtained.    Smoking Status: does not smoke; Counseling Given? n/a Last Mammogram: 10/17/2019; Results: right breast cancer  Caprini Score: 13 ; Risk Factors include: Hx DVT , BMI = 36.37 and length of planned surgery. Recommendation for mechanical SCD   pharmacological prophylaxis. Patient has RX for Lovenox injection by Dr. Lindi Adie 06/07/20  Encourage early ambulation.   Pictures obtained: at consult  Post-op Rx sent to pharmacy: Dr. Claudia Desanctis  Patient was provided with the  General Surgical Risk/ Breast Surgery consent document and Pain Medication Agreement prior to their appointment.  They had adequate time to read through the risk consent documents and Pain Medication Agreement. We also discussed them in person together during this preop appointment. All of their questions were answered by myself & Dr. Claudia Desanctis to their satisfaction.  Recommended calling if they have any further questions.  Risk consent form and Pain Medication Agreement to be scanned into patient's chart.  The risk that can be encountered with breast surgery were discussed and include the following but not limited to these:  Breast asymmetry, fluid accumulation, firmness of the breast,   skin loss,   bleeding, infection, healing delay.  There are risks of anesthesia, changes to skin sensation and injury to nerves or blood vessels.  The  muscle can be temporarily or permanently injured.  You may have an allergic reaction to tape, suture, glue, blood products which can result in skin discoloration, swelling, pain, skin lesions, poor healing.  Any of these can lead to the  need for revisonal surgery or stage procedures.     Electronically signed by: [place], RN 07/04/2020 8:06 AM

## 2020-07-08 ENCOUNTER — Encounter (HOSPITAL_COMMUNITY)
Admission: RE | Admit: 2020-07-08 | Discharge: 2020-07-08 | Disposition: A | Discharge status: Home / Self Care | Payer: 59 | Source: Ambulatory Visit | Attending: General Surgery | Admitting: General Surgery

## 2020-07-08 ENCOUNTER — Encounter (HOSPITAL_COMMUNITY)
Admission: RE | Disposition: A | Discharge status: Home / Self Care | Payer: Self-pay | Source: Home / Self Care | Attending: General Surgery

## 2020-07-08 ENCOUNTER — Observation Stay (HOSPITAL_COMMUNITY)
Admission: RE | Admit: 2020-07-08 | Discharge: 2020-07-10 | Disposition: A | Discharge status: Home / Self Care | Payer: 59 | Attending: General Surgery | Admitting: General Surgery

## 2020-07-08 ENCOUNTER — Ambulatory Visit (HOSPITAL_COMMUNITY): Payer: 59 | Admitting: Physician Assistant

## 2020-07-08 ENCOUNTER — Ambulatory Visit (HOSPITAL_COMMUNITY): Payer: 59 | Admitting: Anesthesiology

## 2020-07-08 DIAGNOSIS — Z7901 Long term (current) use of anticoagulants: Secondary | ICD-10-CM | POA: Diagnosis not present

## 2020-07-08 DIAGNOSIS — Z853 Personal history of malignant neoplasm of breast: Secondary | ICD-10-CM | POA: Insufficient documentation

## 2020-07-08 DIAGNOSIS — J45909 Unspecified asthma, uncomplicated: Secondary | ICD-10-CM | POA: Insufficient documentation

## 2020-07-08 DIAGNOSIS — Z96652 Presence of left artificial knee joint: Secondary | ICD-10-CM | POA: Diagnosis not present

## 2020-07-08 DIAGNOSIS — I10 Essential (primary) hypertension: Secondary | ICD-10-CM | POA: Insufficient documentation

## 2020-07-08 DIAGNOSIS — E119 Type 2 diabetes mellitus without complications: Secondary | ICD-10-CM | POA: Diagnosis not present

## 2020-07-08 DIAGNOSIS — C50911 Malignant neoplasm of unspecified site of right female breast: Secondary | ICD-10-CM | POA: Diagnosis not present

## 2020-07-08 DIAGNOSIS — C50919 Malignant neoplasm of unspecified site of unspecified female breast: Secondary | ICD-10-CM | POA: Diagnosis present

## 2020-07-08 DIAGNOSIS — I82409 Acute embolism and thrombosis of unspecified deep veins of unspecified lower extremity: Secondary | ICD-10-CM | POA: Insufficient documentation

## 2020-07-08 DIAGNOSIS — Z79899 Other long term (current) drug therapy: Secondary | ICD-10-CM | POA: Diagnosis not present

## 2020-07-08 DIAGNOSIS — Z171 Estrogen receptor negative status [ER-]: Secondary | ICD-10-CM

## 2020-07-08 DIAGNOSIS — E039 Hypothyroidism, unspecified: Secondary | ICD-10-CM | POA: Insufficient documentation

## 2020-07-08 DIAGNOSIS — C50411 Malignant neoplasm of upper-outer quadrant of right female breast: Secondary | ICD-10-CM

## 2020-07-08 DIAGNOSIS — Z170 Estrogen receptor positive status [ER+]: Secondary | ICD-10-CM

## 2020-07-08 HISTORY — PX: AXILLARY SENTINEL NODE BIOPSY: SHX5738

## 2020-07-08 HISTORY — PX: TOTAL MASTECTOMY: SHX6129

## 2020-07-08 HISTORY — PX: BREAST RECONSTRUCTION WITH PLACEMENT OF TISSUE EXPANDER AND FLEX HD (ACELLULAR HYDRATED DERMIS): SHX6295

## 2020-07-08 LAB — GLUCOSE, CAPILLARY
Glucose-Capillary: 147 mg/dL — ABNORMAL HIGH (ref 70–99)
Glucose-Capillary: 190 mg/dL — ABNORMAL HIGH (ref 70–99)

## 2020-07-08 SURGERY — MASTECTOMY, SIMPLE
Anesthesia: General | Site: Breast | Laterality: Right

## 2020-07-08 MED ORDER — 0.9 % SODIUM CHLORIDE (POUR BTL) OPTIME
TOPICAL | Status: DC | PRN
  Administered 2020-07-08: 2000 mL
  Administered 2020-07-08: 1000 mL

## 2020-07-08 MED ORDER — BUPIVACAINE-EPINEPHRINE (PF) 0.25% -1:200000 IJ SOLN
INTRAMUSCULAR | Status: AC
  Filled 2020-07-08: qty 30

## 2020-07-08 MED ORDER — CEFAZOLIN SODIUM-DEXTROSE 2-4 GM/100ML-% IV SOLN
2.0000 g | INTRAVENOUS | Status: AC
  Administered 2020-07-08: 2 g via INTRAVENOUS
  Filled 2020-07-08: qty 100

## 2020-07-08 MED ORDER — MORPHINE SULFATE (PF) 2 MG/ML IV SOLN
1.0000 mg | INTRAVENOUS | Status: DC | PRN
  Administered 2020-07-08 – 2020-07-09 (×3): 1 mg via INTRAVENOUS
  Filled 2020-07-08 (×3): qty 1

## 2020-07-08 MED ORDER — METHYLENE BLUE 0.5 % INJ SOLN
INTRAVENOUS | Status: AC
  Filled 2020-07-08: qty 10

## 2020-07-08 MED ORDER — FENTANYL CITRATE (PF) 250 MCG/5ML IJ SOLN
INTRAMUSCULAR | Status: AC
  Filled 2020-07-08: qty 5

## 2020-07-08 MED ORDER — ONDANSETRON 4 MG PO TBDP: 4.0000 mg | ORAL_TABLET | Freq: Four times a day (QID) | ORAL | Status: DC | PRN

## 2020-07-08 MED ORDER — GABAPENTIN 600 MG PO TABS
600.0000 mg | ORAL_TABLET | Freq: Two times a day (BID) | ORAL | Status: DC
  Administered 2020-07-08 – 2020-07-10 (×4): 600 mg via ORAL
  Filled 2020-07-08 (×4): qty 1

## 2020-07-08 MED ORDER — FENTANYL CITRATE (PF) 100 MCG/2ML IJ SOLN
25.0000 ug | INTRAMUSCULAR | Status: DC | PRN
  Administered 2020-07-08 (×2): 25 ug via INTRAVENOUS

## 2020-07-08 MED ORDER — LIDOCAINE 2% (20 MG/ML) 5 ML SYRINGE
INTRAMUSCULAR | Status: DC | PRN
  Administered 2020-07-08: 40 mg via INTRAVENOUS

## 2020-07-08 MED ORDER — ACETAMINOPHEN 325 MG PO TABS
650.0000 mg | ORAL_TABLET | Freq: Four times a day (QID) | ORAL | Status: DC | PRN
Start: 2020-07-08 — End: 2020-07-10

## 2020-07-08 MED ORDER — MIDAZOLAM HCL 2 MG/2ML IJ SOLN
INTRAMUSCULAR | Status: AC
  Filled 2020-07-08: qty 2

## 2020-07-08 MED ORDER — DIPHENHYDRAMINE HCL 50 MG/ML IJ SOLN: 25.0000 mg | Freq: Four times a day (QID) | INTRAMUSCULAR | Status: DC | PRN

## 2020-07-08 MED ORDER — TECHNETIUM TC 99M TILMANOCEPT KIT
1.0000 | PACK | Freq: Once | INTRAVENOUS | Status: AC | PRN
  Administered 2020-07-08: 1 via INTRADERMAL

## 2020-07-08 MED ORDER — ROCURONIUM BROMIDE 10 MG/ML (PF) SYRINGE
PREFILLED_SYRINGE | INTRAVENOUS | Status: DC | PRN
  Administered 2020-07-08: 30 mg via INTRAVENOUS
  Administered 2020-07-08: 100 mg via INTRAVENOUS
  Administered 2020-07-08: 20 mg via INTRAVENOUS

## 2020-07-08 MED ORDER — ORAL CARE MOUTH RINSE: 15.0000 mL | Freq: Once | OROMUCOSAL | Status: AC

## 2020-07-08 MED ORDER — POLYETHYLENE GLYCOL 3350 17 G PO PACK: 17.0000 g | PACK | Freq: Every day | ORAL | Status: DC | PRN

## 2020-07-08 MED ORDER — AMLODIPINE BESYLATE 5 MG PO TABS
5.0000 mg | ORAL_TABLET | Freq: Every day | ORAL | Status: DC
  Administered 2020-07-09 – 2020-07-10 (×2): 5 mg via ORAL
  Filled 2020-07-08 (×2): qty 1

## 2020-07-08 MED ORDER — FENTANYL CITRATE (PF) 250 MCG/5ML IJ SOLN
INTRAMUSCULAR | Status: DC | PRN
  Administered 2020-07-08 (×2): 50 ug via INTRAVENOUS
  Administered 2020-07-08: 100 ug via INTRAVENOUS
  Administered 2020-07-08 (×3): 50 ug via INTRAVENOUS

## 2020-07-08 MED ORDER — ONDANSETRON HCL 4 MG/2ML IJ SOLN
INTRAMUSCULAR | Status: DC | PRN
  Administered 2020-07-08: 4 mg via INTRAVENOUS

## 2020-07-08 MED ORDER — ACETAMINOPHEN 500 MG PO TABS
1000.0000 mg | ORAL_TABLET | ORAL | Status: AC
  Administered 2020-07-08: 1000 mg via ORAL
  Filled 2020-07-08: qty 2

## 2020-07-08 MED ORDER — DULOXETINE HCL 60 MG PO CPEP
90.0000 mg | ORAL_CAPSULE | Freq: Every day | ORAL | Status: DC
  Administered 2020-07-09 – 2020-07-10 (×2): 90 mg via ORAL
  Filled 2020-07-08 (×2): qty 1

## 2020-07-08 MED ORDER — ONDANSETRON HCL 4 MG/2ML IJ SOLN: 4.0000 mg | Freq: Four times a day (QID) | INTRAMUSCULAR | Status: DC | PRN

## 2020-07-08 MED ORDER — IPRATROPIUM BROMIDE 0.06 % NA SOLN
2.0000 | Freq: Every day | NASAL | Status: DC
  Administered 2020-07-09 – 2020-07-10 (×2): 2 via NASAL
  Filled 2020-07-08: qty 15

## 2020-07-08 MED ORDER — KETOROLAC TROMETHAMINE 15 MG/ML IJ SOLN
15.0000 mg | INTRAMUSCULAR | Status: AC
  Administered 2020-07-08: 15 mg via INTRAVENOUS
  Filled 2020-07-08: qty 1

## 2020-07-08 MED ORDER — MIDAZOLAM HCL 2 MG/2ML IJ SOLN
INTRAMUSCULAR | Status: DC | PRN
  Administered 2020-07-08: 2 mg via INTRAVENOUS

## 2020-07-08 MED ORDER — OXYCODONE HCL 5 MG/5ML PO SOLN: 5.0000 mg | Freq: Once | ORAL | Status: DC | PRN

## 2020-07-08 MED ORDER — OXYCODONE HCL 5 MG PO TABS
5.0000 mg | ORAL_TABLET | Freq: Once | ORAL | Status: AC
Start: 2020-07-08 — End: 2020-07-08
  Administered 2020-07-08: 5 mg via ORAL

## 2020-07-08 MED ORDER — BUPIVACAINE HCL (PF) 0.25 % IJ SOLN
INTRAMUSCULAR | Status: DC | PRN
  Administered 2020-07-08 (×2): 30 mL

## 2020-07-08 MED ORDER — PROPOFOL 10 MG/ML IV BOLUS
INTRAVENOUS | Status: AC
  Filled 2020-07-08: qty 20

## 2020-07-08 MED ORDER — LACTATED RINGERS IV SOLN: INTRAVENOUS | Status: DC

## 2020-07-08 MED ORDER — PROPOFOL 10 MG/ML IV BOLUS
INTRAVENOUS | Status: DC | PRN
  Administered 2020-07-08: 200 mg via INTRAVENOUS

## 2020-07-08 MED ORDER — SPY AGENT GREEN - (INDOCYANINE FOR INJECTION)
INTRAMUSCULAR | Status: DC | PRN
  Administered 2020-07-08: 25 mg via INTRAVENOUS

## 2020-07-08 MED ORDER — FENTANYL CITRATE (PF) 100 MCG/2ML IJ SOLN
INTRAMUSCULAR | Status: AC
  Administered 2020-07-08: 25 ug
  Filled 2020-07-08: qty 2

## 2020-07-08 MED ORDER — OXYCODONE HCL 5 MG PO TABS
ORAL_TABLET | ORAL | Status: AC
  Filled 2020-07-08: qty 1

## 2020-07-08 MED ORDER — ACETAMINOPHEN 650 MG RE SUPP: 650.0000 mg | Freq: Four times a day (QID) | RECTAL | Status: DC | PRN

## 2020-07-08 MED ORDER — PROCHLORPERAZINE EDISYLATE 10 MG/2ML IJ SOLN
5.0000 mg | Freq: Four times a day (QID) | INTRAMUSCULAR | Status: DC | PRN
Start: 2020-07-08 — End: 2020-07-10

## 2020-07-08 MED ORDER — ALBUTEROL SULFATE HFA 108 (90 BASE) MCG/ACT IN AERS: 1.0000 | INHALATION_SPRAY | Freq: Four times a day (QID) | RESPIRATORY_TRACT | Status: DC | PRN

## 2020-07-08 MED ORDER — PANTOPRAZOLE SODIUM 40 MG PO TBEC
40.0000 mg | DELAYED_RELEASE_TABLET | Freq: Every day | ORAL | Status: DC
  Administered 2020-07-09 – 2020-07-10 (×2): 40 mg via ORAL
  Filled 2020-07-08 (×2): qty 1

## 2020-07-08 MED ORDER — PROCHLORPERAZINE MALEATE 10 MG PO TABS
10.0000 mg | ORAL_TABLET | Freq: Four times a day (QID) | ORAL | Status: DC | PRN
  Filled 2020-07-08: qty 1

## 2020-07-08 MED ORDER — CHLORHEXIDINE GLUCONATE 0.12 % MT SOLN
15.0000 mL | Freq: Once | OROMUCOSAL | Status: AC
  Administered 2020-07-08: 15 mL via OROMUCOSAL
  Filled 2020-07-08: qty 15

## 2020-07-08 MED ORDER — CEPHALEXIN 500 MG PO CAPS
500.0000 mg | ORAL_CAPSULE | Freq: Three times a day (TID) | ORAL | Status: DC
  Administered 2020-07-08 – 2020-07-10 (×6): 500 mg via ORAL
  Filled 2020-07-08 (×6): qty 1

## 2020-07-08 MED ORDER — OXYCODONE HCL 5 MG PO TABS: 5.0000 mg | ORAL_TABLET | Freq: Once | ORAL | Status: DC | PRN

## 2020-07-08 MED ORDER — AMISULPRIDE (ANTIEMETIC) 5 MG/2ML IV SOLN: 10.0000 mg | Freq: Once | INTRAVENOUS | Status: DC | PRN

## 2020-07-08 MED ORDER — LEVOTHYROXINE SODIUM 50 MCG PO TABS: 50.0000 ug | ORAL_TABLET | Freq: Every morning | ORAL | Status: DC

## 2020-07-08 MED ORDER — METHOCARBAMOL 500 MG PO TABS
500.0000 mg | ORAL_TABLET | Freq: Four times a day (QID) | ORAL | Status: DC | PRN
  Administered 2020-07-09: 500 mg via ORAL
  Filled 2020-07-08: qty 1

## 2020-07-08 MED ORDER — LEVOTHYROXINE SODIUM 50 MCG PO TABS
50.0000 ug | ORAL_TABLET | Freq: Every day | ORAL | Status: DC
  Administered 2020-07-09 – 2020-07-10 (×2): 50 ug via ORAL
  Filled 2020-07-08 (×2): qty 1

## 2020-07-08 MED ORDER — DEXAMETHASONE SODIUM PHOSPHATE 10 MG/ML IJ SOLN: INTRAMUSCULAR | Status: DC | PRN

## 2020-07-08 MED ORDER — SODIUM CHLORIDE 0.9 % IV SOLN: INTRAVENOUS | Status: DC | PRN

## 2020-07-08 MED ORDER — SUGAMMADEX SODIUM 200 MG/2ML IV SOLN
INTRAVENOUS | Status: DC | PRN
  Administered 2020-07-08: 200 mg via INTRAVENOUS

## 2020-07-08 MED ORDER — DEXAMETHASONE SODIUM PHOSPHATE 10 MG/ML IJ SOLN
INTRAMUSCULAR | Status: DC | PRN
  Administered 2020-07-08: 6 mg via INTRAVENOUS

## 2020-07-08 MED ORDER — SODIUM CHLORIDE 0.9 % IV SOLN
Freq: Once | INTRAVENOUS | Status: DC
  Filled 2020-07-08 (×3): qty 10

## 2020-07-08 MED ORDER — OXYCODONE-ACETAMINOPHEN 5-325 MG PO TABS
1.0000 | ORAL_TABLET | ORAL | Status: DC | PRN
  Administered 2020-07-09 – 2020-07-10 (×7): 2 via ORAL
  Filled 2020-07-08 (×7): qty 2

## 2020-07-08 MED ORDER — ONDANSETRON HCL 4 MG/2ML IJ SOLN: 4.0000 mg | Freq: Once | INTRAMUSCULAR | Status: DC | PRN

## 2020-07-08 MED ORDER — DIPHENHYDRAMINE HCL 25 MG PO CAPS: 25.0000 mg | ORAL_CAPSULE | Freq: Four times a day (QID) | ORAL | Status: DC | PRN

## 2020-07-08 MED ORDER — ENSURE PRE-SURGERY PO LIQD: 296.0000 mL | Freq: Once | ORAL | Status: DC

## 2020-07-08 SURGICAL SUPPLY — 112 items
APPLIER CLIP 9.375 MED OPEN (MISCELLANEOUS) ×4
BAG DECANTER FOR FLEXI CONT (MISCELLANEOUS) ×4 IMPLANT
BENZOIN TINCTURE PRP APPL 2/3 (GAUZE/BANDAGES/DRESSINGS) ×8 IMPLANT
BINDER BREAST LRG (GAUZE/BANDAGES/DRESSINGS) IMPLANT
BINDER BREAST XLRG (GAUZE/BANDAGES/DRESSINGS) IMPLANT
BIOPATCH RED 1 DISK 7.0 (GAUZE/BANDAGES/DRESSINGS) ×4 IMPLANT
BIOPATCH RED 1IN DISK 7.0MM (GAUZE/BANDAGES/DRESSINGS) ×2
BLADE SURG 15 STRL LF DISP TIS (BLADE) IMPLANT
BLADE SURG 15 STRL SS (BLADE) ×2
BNDG ELASTIC 6X10 VLCR STRL LF (GAUZE/BANDAGES/DRESSINGS) ×2 IMPLANT
BNDG ELASTIC 6X5.8 VLCR STR LF (GAUZE/BANDAGES/DRESSINGS) ×4 IMPLANT
BNDG GAUZE ELAST 4 BULKY (GAUZE/BANDAGES/DRESSINGS) IMPLANT
CANISTER SUCT 3000ML PPV (MISCELLANEOUS) ×4 IMPLANT
CHLORAPREP W/TINT 26 (MISCELLANEOUS) ×6 IMPLANT
CLIP APPLIE 9.375 MED OPEN (MISCELLANEOUS) IMPLANT
CLOSURE STERI-STRIP 1/2X4 (GAUZE/BANDAGES/DRESSINGS) ×1
CLOSURE WOUND 1/2 X4 (GAUZE/BANDAGES/DRESSINGS) ×2
CLSR STERI-STRIP ANTIMIC 1/2X4 (GAUZE/BANDAGES/DRESSINGS) ×1 IMPLANT
CNTNR URN SCR LID CUP LEK RST (MISCELLANEOUS) ×2 IMPLANT
CONT SPEC 4OZ STRL OR WHT (MISCELLANEOUS) ×2
COVER PROBE W GEL 5X96 (DRAPES) ×4 IMPLANT
COVER SURGICAL LIGHT HANDLE (MISCELLANEOUS) ×4 IMPLANT
COVER WAND RF STERILE (DRAPES) ×4 IMPLANT
DECANTER SPIKE VIAL GLASS SM (MISCELLANEOUS) ×4 IMPLANT
DERMABOND ADVANCED (GAUZE/BANDAGES/DRESSINGS) ×2
DERMABOND ADVANCED .7 DNX12 (GAUZE/BANDAGES/DRESSINGS) ×2 IMPLANT
DRAIN CHANNEL 15F RND FF W/TCR (WOUND CARE) ×2 IMPLANT
DRAIN CHANNEL 19F RND (DRAIN) ×2 IMPLANT
DRAIN JP 15F RND TROCAR (DRAIN) ×4 IMPLANT
DRAPE CHEST BREAST 15X10 FENES (DRAPES) ×4 IMPLANT
DRAPE HALF SHEET 70X43 (DRAPES) ×8 IMPLANT
DRAPE ORTHO SPLIT 77X108 STRL (DRAPES) ×4
DRAPE SURG ORHT 6 SPLT 77X108 (DRAPES) ×4 IMPLANT
DRAPE UTILITY 15X26 TOWEL STRL (DRAPES) ×2 IMPLANT
DRAPE WARM FLUID 44X44 (DRAPES) ×2 IMPLANT
DRSG PAD ABDOMINAL 8X10 ST (GAUZE/BANDAGES/DRESSINGS) ×10 IMPLANT
DRSG TEGADERM 4X10 (GAUZE/BANDAGES/DRESSINGS) IMPLANT
DRSG TEGADERM 4X4.75 (GAUZE/BANDAGES/DRESSINGS) ×6 IMPLANT
ELECT BLADE 4.0 EZ CLEAN MEGAD (MISCELLANEOUS) ×4
ELECT CAUTERY BLADE 6.4 (BLADE) ×4 IMPLANT
ELECT COATED BLADE 2.86 ST (ELECTRODE) IMPLANT
ELECT REM PT RETURN 9FT ADLT (ELECTROSURGICAL) ×8
ELECTRODE BLDE 4.0 EZ CLN MEGD (MISCELLANEOUS) ×2 IMPLANT
ELECTRODE REM PT RTRN 9FT ADLT (ELECTROSURGICAL) ×4 IMPLANT
EVACUATOR SILICONE 100CC (DRAIN) ×8 IMPLANT
GAUZE 4X4 16PLY RFD (DISPOSABLE) ×2 IMPLANT
GAUZE SPONGE 4X4 12PLY STRL (GAUZE/BANDAGES/DRESSINGS) ×2 IMPLANT
GAUZE SPONGE 4X4 12PLY STRL LF (GAUZE/BANDAGES/DRESSINGS) ×2 IMPLANT
GLOVE BIO SURGEON STRL SZ7 (GLOVE) ×4 IMPLANT
GLOVE BIOGEL M 7.0 STRL (GLOVE) ×4 IMPLANT
GLOVE BIOGEL M STRL SZ7.5 (GLOVE) ×8 IMPLANT
GLOVE BIOGEL PI IND STRL 7.5 (GLOVE) ×2 IMPLANT
GLOVE BIOGEL PI INDICATOR 7.5 (GLOVE) ×2
GLOVE SRG 8 PF TXTR STRL LF DI (GLOVE) IMPLANT
GLOVE SURG UNDER POLY LF SZ8 (GLOVE)
GOWN STRL REUS W/ TWL LRG LVL3 (GOWN DISPOSABLE) ×10 IMPLANT
GOWN STRL REUS W/TWL LRG LVL3 (GOWN DISPOSABLE) ×10
GRAFT FLEX HD 19X22X0.7-1.4 (Tissue) ×4 IMPLANT
IMPL BREAST GEL 440CC (Breast) IMPLANT
IMPLANT BREAST GEL 440CC (Breast) ×8 IMPLANT
KIT BASIN OR (CUSTOM PROCEDURE TRAY) ×8 IMPLANT
KIT FILL SYSTEM UNIVERSAL (SET/KITS/TRAYS/PACK) IMPLANT
KIT TURNOVER KIT B (KITS) ×4 IMPLANT
MARKER SKIN DUAL TIP RULER LAB (MISCELLANEOUS) IMPLANT
NDL 18GX1X1/2 (RX/OR ONLY) (NEEDLE) IMPLANT
NDL FILTER BLUNT 18X1 1/2 (NEEDLE) IMPLANT
NDL HYPO 25GX1X1/2 BEV (NEEDLE) ×2 IMPLANT
NDL HYPO 25X1 1.5 SAFETY (NEEDLE) IMPLANT
NEEDLE 18GX1X1/2 (RX/OR ONLY) (NEEDLE) IMPLANT
NEEDLE FILTER BLUNT 18X 1/2SAF (NEEDLE)
NEEDLE FILTER BLUNT 18X1 1/2 (NEEDLE) IMPLANT
NEEDLE HYPO 25GX1X1/2 BEV (NEEDLE) IMPLANT
NEEDLE HYPO 25X1 1.5 SAFETY (NEEDLE) IMPLANT
NS IRRIG 1000ML POUR BTL (IV SOLUTION) ×8 IMPLANT
PACK GENERAL/GYN (CUSTOM PROCEDURE TRAY) ×6 IMPLANT
PACK SPY-PHI (KITS) ×4 IMPLANT
PAD ARMBOARD 7.5X6 YLW CONV (MISCELLANEOUS) ×4 IMPLANT
PENCIL SMOKE EVACUATOR (MISCELLANEOUS) ×6 IMPLANT
PIN SAFETY STERILE (MISCELLANEOUS) ×4 IMPLANT
RETRACTOR ONETRAX LX 135X30 (MISCELLANEOUS) IMPLANT
SIZER BREAST REUSE 440CC (SIZER) ×4
SIZER BRST REUSE P4.5 440CC (SIZER) IMPLANT
SLEEVE SCD COMPRESS KNEE MED (STOCKING) ×4 IMPLANT
SPONGE LAP 18X18 RF (DISPOSABLE) ×12 IMPLANT
STAPLER INSORB 30 2030 C-SECTI (MISCELLANEOUS) ×4 IMPLANT
STAPLER VISISTAT 35W (STAPLE) ×2 IMPLANT
STRIP CLOSURE SKIN 1/2X4 (GAUZE/BANDAGES/DRESSINGS) ×6 IMPLANT
SUT ETHILON 2 0 FS 18 (SUTURE) ×6 IMPLANT
SUT MNCRL AB 4-0 PS2 18 (SUTURE) ×2 IMPLANT
SUT MON AB 3-0 SH 27 (SUTURE)
SUT MON AB 3-0 SH27 (SUTURE) ×2 IMPLANT
SUT MON AB 4-0 PC3 18 (SUTURE) ×2 IMPLANT
SUT PDS AB 3-0 SH 27 (SUTURE) ×22 IMPLANT
SUT PLAIN 5 0 P 3 18 (SUTURE) IMPLANT
SUT SILK 2 0 SH (SUTURE) IMPLANT
SUT VIC AB 2-0 SH 18 (SUTURE) ×2 IMPLANT
SUT VIC AB 2-0 SH 27 (SUTURE)
SUT VIC AB 2-0 SH 27XBRD (SUTURE) ×2 IMPLANT
SUT VIC AB 3-0 SH 18 (SUTURE) ×4 IMPLANT
SUT VIC AB 3-0 SH 27 (SUTURE)
SUT VIC AB 3-0 SH 27XBRD (SUTURE) ×2 IMPLANT
SUT VLOC 180 0 24IN GS25 (SUTURE) ×4 IMPLANT
SUT VLOC 90 P-14 23 (SUTURE) ×8 IMPLANT
SYR 50ML LL SCALE MARK (SYRINGE) ×4 IMPLANT
SYR BULB IRRIG 60ML STRL (SYRINGE) ×4 IMPLANT
SYR CONTROL 10ML LL (SYRINGE) ×2 IMPLANT
TAPE MEASURE VINYL STERILE (MISCELLANEOUS) IMPLANT
TOWEL GREEN STERILE (TOWEL DISPOSABLE) ×4 IMPLANT
TOWEL GREEN STERILE FF (TOWEL DISPOSABLE) ×12 IMPLANT
TRAY FOLEY W/BAG SLVR 14FR LF (SET/KITS/TRAYS/PACK) ×2 IMPLANT
TUBE CONNECTING 20'X1/4 (TUBING)
TUBE CONNECTING 20X1/4 (TUBING) ×2 IMPLANT

## 2020-07-08 NOTE — Op Note (Signed)
Preoperative diagnosis: right breast cancer, prior right lumpectomy/sn/chemo/rads Postoperative diagnosis: saa Procedure: 1. Right mastectomy 2. Right deep axillary sentinel node biopsy 3. Left mastectomy Surgeon: Dr Serita Grammes  Anesthesia: general with pec blocks EBL: 50 cc Drains per plastic surgery Complications none Specimens: 1. Right mastectomy short superior, long lateral 2. Right deep axillary nodes with highest count 172 3. Left mastectomy short superior, long lateral dispo case turned over to plastic surgery for reconstruction  Indications: 27 yof who has negative genetic testing and prior history (all in [place]) in 2014 of neo tchp for IDC er/pr neg, her 2 pos, Ki 20-25%. she then had lump/sn (combined with reduction) that had CPR. followed by radiotherapy. she has done well since then. no history lymphedema. apparently with left sided reduction she had what sounds like ALH then as well. she has been followed with staggered MR/MM. 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 there is a left sided area of enhancement that measures 10.5x4.3x9.4 cm in size. nodes are all normal. 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 We have elected to proceed with bilateral mastectomy and repeat sn biopsy.  Procedure: After informed consent was obtained she underwent bilateral pectoral blocks. She underwent injection of lymphoseek in standard periareolar fashion.  Abx were given. SCDs were in place. She was placed under general anesthesia without complication.  She was prepped and draped in standard sterile surgical fashion. Timeout performed.  I first did the left mastectomy. I made an elliptical incision encompassing the nac.  I then created flaps to the clavicle, parasternal, IM fold and latissimus laterally. The  inferior portion was difficult due to scarring.  I then removed the breast and the fascia from the pectoralis muscle.  This was marked as above and passed off the table.  I obtained hemostasis and packed this side.  I then did the right mastectomy.  I  I made an elliptical incision encompassing the nac.  I then created flaps to the clavicle, parasternal, IM fold and latissimus laterally. The inferior portion was difficult due to scarring and this flap was thin as this is where the tumor was.  I then removed the breast and the fascia from the pectoralis muscle. This was marked as above. I then did identify radioactivity in the axilla. I opened the axilla and identified what appeared to be one node with the counts as above. There was no more background radioactivity.  I then obtained hemostasis, packed this side and turned the case over to Dr Claudia Desanctis

## 2020-07-08 NOTE — Op Note (Signed)
Operative Note   DATE OF OPERATION: 07/08/2020  SURGICAL DEPARTMENT: Plastic Surgery  PREOPERATIVE DIAGNOSES: Right breast cancer  POSTOPERATIVE DIAGNOSES:  same  PROCEDURE:  1.  Bilateral direct to implant breast reconstruction with acellular dermal matrix 2.  Indocyanine green angiography of bilateral mastectomy flaps  SURGEON: Talmadge Coventry, MD  ASSISTANT: [place], RNFA The advanced practice practitioner (APP) assisted throughout the case.  The APP was essential in retraction and counter traction when needed to make the case progress smoothly.  This retraction and assistance made it possible to see the tissue plans for the procedure.  The assistance was needed for blood control, tissue re-approximation and assisted with closure of the incision site.  ANESTHESIA:  General.   COMPLICATIONS: None.   INDICATIONS FOR PROCEDURE:  The patient, Sierra Perez is a 60 y.o. female born on 1960/04/07, is here for treatment of bilateral mastectomy defect MRN: [id number]  CONSENT:  Informed consent was obtained directly from the patient. Risks, benefits and alternatives were fully discussed. Specific risks including but not limited to bleeding, infection, hematoma, seroma, scarring, pain, contracture, asymmetry, wound healing problems, and need for further surgery were all discussed. The patient did have an ample opportunity to have questions answered to satisfaction.   DESCRIPTION OF PROCEDURE:  The patient was taken to the operating room. SCDs were placed and antibiotics were given.  General anesthesia was administered.  The patient's operative site was prepped and draped in a sterile fashion. A time out was performed and all information was confirmed to be correct.  Dr. Donne Hazel performed bilateral skin sparing mastectomies.  After he had finished his portion which will be dictated separately he turned the patient over to me.  I started by inspecting mastectomy flaps.  Clinically they  looked good.  Indocyanine green angiography was utilized and demonstrated a small area of slow filling in the inferior aspect of the flap on the left side.  I took a little bit more skin along the inferior aspect on that side with a 15 blade and this was sent to pathology.  I then did the same thing with the skin on the other side for symmetry.  At this point everything seem to be perfusing nicely.  I started by tacking down the lateral soft tissues with a 0 V-Loc on both sides in order to close off that aspect of the pocket.  15 French drains were then placed on both sides and secured with nylon sutures.  I brought a 440 cc sizer onto the table to estimate if that would work for a size for her.  It seemed to give her a reasonable size albeit smaller than she was before which was her goal preoperatively.  It did not stress the skin closure at all.  At this point elected to move forward with direct implant reconstruction.  2 pieces of Flex HD pliable pre acellular dermal matrix were brought onto the table and rinsed with saline.  I then secured them medially superiorly and inferiorly on both sides with interrupted 3-0 PDS figure-of-eight sutures.  The sizer was then placed to ensure appropriate tacking of the pocket and the lateral aspect of the ADM was trimmed on both sides to facilitate holding the implants in a symmetric and appropriate position.  I then irrigated both pockets with triple antibiotic solution.  The implants were then brought onto the field.  I elected to utilize Mentor smooth round moderate plus profile extra implants with 440 cc of volume.  The serial  number on the right side is [id number].  Serial number on the left side is [id number].  I changed my gloves and then inserted each implant atraumatically beneath acellular dermal matrix.  3-0 PDS figure-of-eight sutures were then used to tack down the ADM laterally to close off the pocket.  At this point minor revisions were made to the skin  laterally to take out any dogears and after ensuring meticulous hemostasis the skin was advanced and closed in layers with interrupted buried Enzor staples and a running 3 oh V-Loc.  Benzoin and Steri-Strips were applied.  This gave a nice reconstruction which appeared symmetric on the table.  Soft supportive dressing was applied.  The patient tolerated the procedure well.  There were no complications. The patient was allowed to wake from anesthesia, extubated and taken to the recovery room in satisfactory condition.

## 2020-07-08 NOTE — Brief Op Note (Signed)
07/08/2020  1:46 PM  PATIENT:  Sierra Perez  60 y.o. female  PRE-OPERATIVE DIAGNOSIS:  BREAST CANCER  POST-OPERATIVE DIAGNOSIS:  BREAST CANCER  PROCEDURE:  Procedure(s): BILATERAL TOTAL MASTECTOMY (Bilateral) RIGHT AXILLARY SENTINEL NODE BIOPSY (Right) BILATERAL BREAST RECONSTRUCTION WITH  FLEX HD (ACELLULAR HYDRATED DERMIS),DIRECT IMPLANT RECONSTRUCTION (Bilateral)  SURGEON:  Surgeon(s) and Role: Panel 1:    Rolm Bookbinder, MD - Primary Panel 2:    * Dani Wallner, Steffanie Dunn, MD - Primary  PHYSICIAN ASSISTANT: [place], RNFA  ASSISTANTS: none   ANESTHESIA:   general  EBL:  100 mL   BLOOD ADMINISTERED:none  DRAINS: (2 ) Jackson-Pratt drain(s) with closed bulb suction in the chest   LOCAL MEDICATIONS USED:  NONE  SPECIMEN:  Source of Specimen:  mastectomy flap right and left  DISPOSITION OF SPECIMEN:  PATHOLOGY  COUNTS:  YES  TOURNIQUET:  * No tourniquets in log *  DICTATION: .Dragon Dictation  PLAN OF CARE: Admit to inpatient   PATIENT DISPOSITION:  PACU - hemodynamically stable.   Delay start of Pharmacological VTE agent (>24hrs) due to surgical blood loss or risk of bleeding: not applicable

## 2020-07-08 NOTE — Anesthesia Procedure Notes (Signed)
Anesthesia Regional Block: Pectoralis block   Pre-Anesthetic Checklist: ,, timeout performed, Correct Patient, Correct Site, Correct Laterality, Correct Procedure, Correct Position, site marked, Risks and benefits discussed,  Surgical consent,  Pre-op evaluation,  At surgeon's request and post-op pain management  Laterality: Right  Prep: chloraprep       Needles:  Injection technique: Single-shot  Needle Type: Echogenic Stimulator Needle     Needle Length: 10cm  Needle Gauge: 20     Additional Needles:   Procedures:,,,, ultrasound used (permanent image in chart),,,,  Narrative:  Start time: 07/08/2020 7:15 AM End time: 07/08/2020 7:19 AM Injection made incrementally with aspirations every 5 mL.  Performed by: Personally  Anesthesiologist: Lidia Collum, MD  Additional Notes: Standard monitors applied. Skin prepped. Good needle visualization with ultrasound. Injection made in 5cc increments with no resistance to injection. Patient tolerated the procedure well.

## 2020-07-08 NOTE — Interval H&P Note (Signed)
History and Physical Interval Note:  07/08/2020 7:21 AM  Sierra Perez  has presented today for surgery, with the diagnosis of BREAST CANCER.  The various methods of treatment have been discussed with the patient and family. After consideration of risks, benefits and other options for treatment, the patient has consented to  Procedure(s): BILATERAL TOTAL MASTECTOMY (Bilateral) RIGHT AXILLARY SENTINEL NODE BIOPSY (Right) BILATERAL BREAST RECONSTRUCTION WITH PLACEMENT OF TISSUE EXPANDER AND FLEX HD (ACELLULAR HYDRATED DERMIS), POSSIBLE DIRECT IMPLANT RECONSTRUCTION (Bilateral) as a surgical intervention.  The patient's history has been reviewed, patient examined, no change in status, stable for surgery.  I have reviewed the patient's chart and labs.  Questions were answered to the patient's satisfaction.     Cindra Presume

## 2020-07-08 NOTE — Transfer of Care (Signed)
Immediate Anesthesia Transfer of Care Note  Patient: Sierra Perez  Procedure(s) Performed: BILATERAL TOTAL MASTECTOMY (Bilateral Breast) RIGHT AXILLARY SENTINEL NODE BIOPSY (Right Breast) BILATERAL BREAST RECONSTRUCTION WITH  FLEX HD (ACELLULAR HYDRATED DERMIS),DIRECT IMPLANT RECONSTRUCTION (Bilateral Breast)  Patient Location: PACU  Anesthesia Type:General  Level of Consciousness: awake, alert  and oriented  Airway & Oxygen Therapy: Patient Spontanous Breathing and Patient connected to nasal cannula oxygen  Post-op Assessment: Report given to RN, Post -op Vital signs reviewed and stable and Patient moving all extremities X 4  Post vital signs: Reviewed and stable  Last Vitals:  Vitals Value Taken Time  BP 148/71 07/08/20 1153  Temp 36.6 C 07/08/20 1153  Pulse 81 07/08/20 1154  Resp 13 07/08/20 1154  SpO2 97 % 07/08/20 1154  Vitals shown include unvalidated device data.  Last Pain:  Vitals:   07/08/20 1153  PainSc: Asleep         Complications: No complications documented.

## 2020-07-08 NOTE — Anesthesia Procedure Notes (Signed)
Anesthesia Regional Block: Pectoralis block   Pre-Anesthetic Checklist: ,, timeout performed, Correct Patient, Correct Site, Correct Laterality, Correct Procedure, Correct Position, site marked, Risks and benefits discussed,  Surgical consent,  Pre-op evaluation,  At surgeon's request and post-op pain management  Laterality: Left  Prep: chloraprep       Needles:  Injection technique: Single-shot  Needle Type: Echogenic Stimulator Needle     Needle Length: 10cm  Needle Gauge: 20     Additional Needles:   Procedures:,,,, ultrasound used (permanent image in chart),,,,  Narrative:  Start time: 07/08/2020 7:09 AM End time: 07/08/2020 7:15 AM Injection made incrementally with aspirations every 5 mL.  Performed by: Personally  Anesthesiologist: Lidia Collum, MD  Additional Notes: Standard monitors applied. Skin prepped. Good needle visualization with ultrasound. Injection made in 5cc increments with no resistance to injection. Patient tolerated the procedure well.

## 2020-07-08 NOTE — Anesthesia Postprocedure Evaluation (Signed)
Anesthesia Post Note  Patient: Sierra Perez  Procedure(s) Performed: BILATERAL TOTAL MASTECTOMY (Bilateral Breast) RIGHT AXILLARY SENTINEL NODE BIOPSY (Right Breast) BILATERAL BREAST RECONSTRUCTION WITH  FLEX HD (ACELLULAR HYDRATED DERMIS),DIRECT IMPLANT RECONSTRUCTION (Bilateral Breast)     Patient location during evaluation: PACU Anesthesia Type: General Level of consciousness: awake and alert Pain management: pain level controlled Vital Signs Assessment: post-procedure vital signs reviewed and stable Respiratory status: spontaneous breathing, nonlabored ventilation and respiratory function stable Cardiovascular status: blood pressure returned to baseline and stable Postop Assessment: no apparent nausea or vomiting Anesthetic complications: no   No complications documented.  Last Vitals:  Vitals:   07/08/20 1323 07/08/20 1410  BP: (!) 145/72 (!) 151/80  Pulse: 75 77  Resp: 14 16  Temp: 37 C 37.2 C  SpO2: 100%     Last Pain:  Vitals:   07/08/20 1410  TempSrc: Oral  PainSc:                  Lidia Collum

## 2020-07-08 NOTE — Anesthesia Procedure Notes (Signed)
Procedure Name: Intubation Date/Time: 07/08/2020 7:45 AM Performed by: Mariea Clonts, CRNA Pre-anesthesia Checklist: Patient identified, Emergency Drugs available, Suction available and Patient being monitored Patient Re-evaluated:Patient Re-evaluated prior to induction Oxygen Delivery Method: Circle System Utilized Preoxygenation: Pre-oxygenation with 100% oxygen Induction Type: IV induction Ventilation: Mask ventilation without difficulty and Oral airway inserted - appropriate to patient size Laryngoscope Size: Sabra Heck and 2 Grade View: Grade II Tube type: Oral Tube size: 7.5 mm Number of attempts: 1 Airway Equipment and Method: Stylet and Oral airway Placement Confirmation: ETT inserted through vocal cords under direct vision,  positive ETCO2 and breath sounds checked- equal and bilateral Tube secured with: Tape Dental Injury: Teeth and Oropharynx as per pre-operative assessment

## 2020-07-08 NOTE — H&P (Signed)
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 she then had lump/sn (combined with reduction) that had CPR. followed by radiotherapy. she has done well since then. no history lymphedema. apparently with left sided reduction she had what sounds like ALH then as well. she has been followed with staggered MR/MM. 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 there is a left sided area of enhancement that measures 10.5x4.3x9.4 cm in size. nodes are all normal. 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 she has no mass or dc she notes. she has been seen by Dr Lindi Adie. she has significant history of oophrectomy, sleeve gastrectomy in past. after initial breast cacner she had dvt at some point. she states she has been evaluated for clotting disorders and does not have one. 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 she thinks she had dvt on lovenox and on xarelto. she has not had dvt since 2018. she has had oral surgery and csc while off current eliquis and done well.    Past Surgical History  Breast Biopsy  Bilateral. multiple Breast Mass; Local Excision  Right. Colon Polyp Removal - Colonoscopy  Foot Surgery  Right. Hysterectomy (not due to cancer) - Complete  Knee Surgery  Bilateral. Mammoplasty; Reduction  Bilateral. Oral Surgery  Sentinel Lymph Node Biopsy  Sleeve Gastrectomy   Diagnostic Studies History  Colonoscopy  within last year Mammogram  within last year Pap Smear  1-5 years ago  Allergies  Sulfacetamide *CHEMICALS*  Succinylcholine Chloride *CHEMICALS*   Medication History  Anastrozole (1MG Tablet, Oral) Active. DULoxetine HCl (30MG Capsule DR Part, Oral) Active. Eliquis (5MG Tablet, Oral)  Active. Gabapentin (600MG Tablet, Oral) Active. Levothyroxine Sodium (50MCG Tablet, Oral) Active. Pantoprazole Sodium (40MG Tablet DR, Oral) Active. valACYclovir HCl (500MG Tablet, Oral) Active. Bisacodyl EC (5MG Tablet DR, Oral) Active. Vitamin D3 (25 MCG(1000 UT) Tablet Chewable, Oral) Active. MiraLax (17GM Packet, Oral) Active. Medications Reconciled  Social History  Alcohol use  Occasional alcohol use. No caffeine use  No drug use  Tobacco use  Never smoker.  Family History  Arthritis  Father, Mother. Bleeding disorder  Father. Breast Cancer  Family Members In General. Cerebrovascular Accident  Family Members In General, Father. Colon Cancer  Family Members In General. Colon Polyps  Mother. Depression  Family Members In General. Diabetes Mellitus  Family Members In General, Father. Hypertension  Father, Mother. Kidney Disease  Father. Thyroid problems  Family Members In General.  Pregnancy / Birth History Age at menarche  51 years. Age of menopause  51-55 Contraceptive History  Oral contraceptives. Gravida  1 Irregular periods  Maternal age  15-40 Para  1  Other Problems  Anxiety Disorder  Arthritis  Asthma  Breast Cancer  Diabetes Mellitus  Gastroesophageal Reflux Disease  General anesthesia - complications  Heart murmur  High blood pressure  Lump In Breast  Oophorectomy  Bilateral. Other disease, cancer, significant illness  Pulmonary Embolism / Blood Clot in Legs  Sleep Apnea  Thyroid Disease  Transfusion history    Review of Systems  General Present- Weight Gain. Not Present- Appetite Loss, Chills, Fatigue, Fever, Night Sweats and Weight Loss. Skin Not Present- Change in Wart/Mole, Dryness, Hives,  Jaundice, New Lesions, Non-Healing Wounds, Rash and Ulcer. HEENT Present- Seasonal Allergies and Wears glasses/contact lenses. Not Present- Earache, Hearing Loss, Hoarseness, Nose Bleed, Oral Ulcers, Ringing in  the Ears, Sinus Pain, Sore Throat, Visual Disturbances and Yellow Eyes. Respiratory Present- Snoring. Not Present- Bloody sputum, Chronic Cough, Difficulty Breathing and Wheezing. Breast Present- Breast Mass. Not Present- Breast Pain, Nipple Discharge and Skin Changes. Cardiovascular Not Present- Chest Pain, Difficulty Breathing Lying Down, Leg Cramps, Palpitations, Rapid Heart Rate, Shortness of Breath and Swelling of Extremities. Gastrointestinal Present- Constipation and Indigestion. Not Present- Abdominal Pain, Bloating, Bloody Stool, Change in Bowel Habits, Chronic diarrhea, Difficulty Swallowing, Excessive gas, Gets full quickly at meals, Hemorrhoids, Nausea, Rectal Pain and Vomiting. Female Genitourinary Not Present- Frequency, Nocturia, Painful Urination, Pelvic Pain and Urgency. Musculoskeletal Present- Joint Pain. Not Present- Back Pain, Joint Stiffness, Muscle Pain, Muscle Weakness and Swelling of Extremities. Neurological Not Present- Decreased Memory, Fainting, Headaches, Numbness, Seizures, Tingling, Tremor, Trouble walking and Weakness. Psychiatric Present- Anxiety, Depression and Frequent crying. Not Present- Bipolar, Change in Sleep Pattern and Fearful. Endocrine Not Present- Cold Intolerance, Excessive Hunger, Hair Changes, Heat Intolerance, Hot flashes and New Diabetes. Hematology Present- Blood Thinners. Not Present- Easy Bruising, Excessive bleeding, Gland problems, HIV and Persistent Infections.   Physical Exam  General Mental Status-Alert. Orientation-Oriented X3. Breast Nipples-No Discharge. Breast Lump-No Palpable Breast Mass. Note: well healed scars bilaterally Lymphatic Head & Neck General Head & Neck Lymphatics: Bilateral - Description - Normal. Axillary General Axillary Region: Bilateral - Description - Normal. Note: no [place] adenopathy   Assessment  Plan  RECURRENT BREAST CANCER, RIGHT (C50.911) Story: Right mastectomy, attempt repeat right ax sn  biopsy, left mastectomy We discussed this is new primary cancer. discussed staging and pathophysiology of breast cancer in relation to her prior treatment. On the right side due to prior lump/radiotherapy I think at her 60 age she needs mastectomy. if was older we could consider lumpectomy and forgo radiotherapy but with her overall history I dont think this is good plan. we discussed ssm (I dont think either side candidate for nsm) with possible reconstruction if she desires. this is complicated on right by prior radiotherapy and she will see plastics . I also recommended attempt at repeat sn biopsy on right.this would certainly increase lymphedema risk. discussed if not able to localize I would not address lymph nodes and we would treat as node negative. no real advantage to dissect her axilla. for the left side I think at minimum she needs a couple larger excisional biopsies. to remove this entire area (she already has a reduction) would be most of that breast. then this would need to be followed (if benign) with mm/mr. the other option given two breast cancers in this 60 yo with very abnormal left mr and lcis is mastectomy. this would be her preference. she understands this is not 100% preventive. I think ssm on this side reasonable with reconstruction if she chooses. all of this is complicated by vte history. Will follow Dr Landis Gandy recommendations

## 2020-07-08 NOTE — Plan of Care (Signed)

## 2020-07-09 ENCOUNTER — Encounter (HOSPITAL_COMMUNITY): Payer: Self-pay | Admitting: General Surgery

## 2020-07-09 DIAGNOSIS — C50911 Malignant neoplasm of unspecified site of right female breast: Secondary | ICD-10-CM | POA: Diagnosis not present

## 2020-07-09 LAB — CBC
HCT: 29.8 % — ABNORMAL LOW (ref 36.0–46.0)
Hemoglobin: 9.7 g/dL — ABNORMAL LOW (ref 12.0–15.0)
MCH: 28.4 pg (ref 26.0–34.0)
MCHC: 32.6 g/dL (ref 30.0–36.0)
MCV: 87.1 fL (ref 80.0–100.0)
Platelets: 227 10*3/uL (ref 150–400)
RBC: 3.42 MIL/uL — ABNORMAL LOW (ref 3.87–5.11)
RDW: 14.6 % (ref 11.5–15.5)
WBC: 10.7 10*3/uL — ABNORMAL HIGH (ref 4.0–10.5)
nRBC: 0 % (ref 0.0–0.2)

## 2020-07-09 LAB — APTT: aPTT: 40 seconds — ABNORMAL HIGH (ref 24–36)

## 2020-07-09 LAB — HEPARIN LEVEL (UNFRACTIONATED): Heparin Unfractionated: 0.27 IU/mL — ABNORMAL LOW (ref 0.30–0.70)

## 2020-07-09 MED ORDER — HEPARIN (PORCINE) 25000 UT/250ML-% IV SOLN
1350.0000 [IU]/h | INTRAVENOUS | Status: DC
  Administered 2020-07-09: 1200 [IU]/h via INTRAVENOUS
  Administered 2020-07-10: 1350 [IU]/h via INTRAVENOUS
  Filled 2020-07-09 (×2): qty 250

## 2020-07-09 NOTE — Progress Notes (Signed)
1 Day Post-Op   Subjective/Chief Complaint: Doing well, sore, cpap on, has been oob. tol diet   Objective: Vital signs in last 24 hours: Temp:  [97.8 F (36.6 C)-99 F (37.2 C)] 98 F (36.7 C) (06/07 0538) Pulse Rate:  [73-86] 79 (06/07 0538) Resp:  [13-17] 17 (06/07 0538) BP: (129-151)/(60-80) 130/60 (06/07 0538) SpO2:  [92 %-100 %] 97 % (06/07 0538)    Intake/Output from previous day: 06/06 0701 - 06/07 0700 In: 1920 [P.O.:120; I.V.:1700; IV Piggyback:100] Out: 4944 [Urine:1550; Drains:190; Blood:100] Intake/Output this shift: No intake/output data recorded.  bilateral drains with expected ss output, binder in place  Lab Results:  No results for input(s): WBC, HGB, HCT, PLT in the last 72 hours. BMET No results for input(s): NA, K, CL, CO2, GLUCOSE, BUN, CREATININE, CALCIUM in the last 72 hours. PT/INR No results for input(s): LABPROT, INR in the last 72 hours. ABG No results for input(s): PHART, HCO3 in the last 72 hours.  Invalid input(s): PCO2, PO2  Studies/Results: NM Sentinel Node Inj-No Rpt (Breast)  Result Date: 07/08/2020 Sulfur Colloid was injected by the Nuclear Medicine Technologist for sentinel lymph node localization.    Anti-infectives: Anti-infectives (From admission, onward)   Start     Dose/Rate Route Frequency Ordered Stop   07/08/20 1800  cephALEXin (KEFLEX) capsule 500 mg        500 mg Oral Every 8 hours 07/08/20 1221     07/08/20 1036  ceFAZolin 1 g / gentamicin 80 mg in NS 500 mL surgical irrigation  Status:  Discontinued          As needed 07/08/20 1036 07/08/20 1149   07/08/20 0745  ANCEF 1 gram in 0.9% normal saline 1000 mL  Status:  Discontinued         Irrigation  Once 07/08/20 0723 07/08/20 1227   07/08/20 0615  ceFAZolin (ANCEF) IVPB 2g/100 mL premix        2 g 200 mL/hr over 30 Minutes Intravenous On call to O.R. 07/08/20 [id number] 07/08/20 0818      Assessment/Plan: POD 1 bilateral mastectomies, sn biopsy, implants -regular  diet -ambulate, pulm toilet -will start iv heparin today, if no issues can dc home tomorrow on eliquis- do not want to just start eliquis and have issues with bleeding -plan home tomorrow  Rolm Bookbinder 07/09/2020

## 2020-07-09 NOTE — Plan of Care (Signed)
  Problem: Education: Goal: Knowledge of General Education information will improve Description Including pain rating scale, medication(s)/side effects and non-pharmacologic comfort measures Outcome: Progressing   

## 2020-07-09 NOTE — Progress Notes (Signed)
ANTICOAGULATION CONSULT NOTE - Initial Consult  Pharmacy Consult for Heparin (Apixaban PTA) Indication: History of DVT  Allergies  Allergen Reactions  . Succinylcholine Other (See Comments)    Muscle aching  18 years ago from 07/02/20  . Sulfa Antibiotics Rash    And swelling generalized not in throat    Patient Measurements: Height: 5' 8"  (172.7 cm) Weight: 108.5 kg (239 lb 3.2 oz) IBW/kg (Calculated) : 63.9  Vital Signs: Temp: 98 F (36.7 C) (06/07 0538) Temp Source: Oral (06/07 0538) BP: 130/60 (06/07 0538) Pulse Rate: 79 (06/07 0538)  Labs: No results for input(s): HGB, HCT, PLT, APTT, LABPROT, INR, HEPARINUNFRC, HEPRLOWMOCWT, CREATININE, CKTOTAL, CKMB, TROPONINIHS in the last 72 hours.  Estimated Creatinine Clearance: 97.7 mL/min (by C-G formula based on SCr of 0.7 mg/dL).   Medical History: Past Medical History:  Diagnosis Date  . Anxiety   . Asthma   . Breast cancer ([place]) 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, her-2 positive, ER/PR negative  . Clotting disorder ([place])    testing was negative, h/o DVT while on treatment  . Complex regional pain syndrome i of right lower limb    RDS  . DVT (deep venous thrombosis) ([place])    started in 2015, multiple  . Family history of breast cancer   . Family history of colon cancer   . Fibroid   . Heart murmur   . HPV in female   . 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 Hypothyroidism   . IBS (irritable bowel syndrome)   . Mitral valve prolapse   . Personal history of malignant neoplasm of breast   . Thyroid disease      Assessment: 60 y/o F on apixaban PTA for history of DVT, she was on Lovenox bridging for a few days prior to surgery on 6/6, we are now starting heparin post-operatively. Should be able to use heparin level only, but will also check aPTT with first heparin level check to be sure.   Goal of Therapy:  Heparin level 0.3-0.7 units/ml aPTT 66-102 seconds Monitor platelets by anticoagulation  protocol: Yes   Plan:  No bolus post-op Start heparin drip at 1200 units/hr 1500 heparin level and aPTT Daily CBC, heparin level, and aPTT Monitor for bleeding  Narda Bonds, PharmD, BCPS Clinical Pharmacist Phone: [phone]

## 2020-07-09 NOTE — Progress Notes (Signed)
[place] for Heparin (Apixaban PTA) Indication: History of DVT  Allergies  Allergen Reactions  . Succinylcholine Other (See Comments)    Muscle aching  60 years ago from 07/02/20  . Sulfa Antibiotics Rash    And swelling generalized not in throat    Patient Measurements: Height: 5' 8"  (172.7 cm) Weight: 108.5 kg (239 lb 3.2 oz) IBW/kg (Calculated) : 63.9  Vital Signs: Temp: 97.9 F (36.6 C) (06/07 1250) Temp Source: Oral (06/07 1250) BP: 130/81 (06/07 1250) Pulse Rate: 83 (06/07 1250)  Labs: Recent Labs    07/09/20 0731 07/09/20 1528  HGB 9.7*  --   HCT 29.8*  --   PLT 227  --   APTT  --  40*  HEPARINUNFRC  --  0.27*    Estimated Creatinine Clearance: 97.7 mL/min (by C-G formula based on SCr of 0.7 mg/dL).   Medical History: Past Medical History:  Diagnosis Date  . Anxiety   . Asthma   . Breast cancer ([place]) 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, her-2 positive, ER/PR negative  . Clotting disorder ([place])    testing was negative, h/o DVT while on treatment  . Complex regional pain syndrome i of right lower limb    RDS  . DVT (deep venous thrombosis) ([place])    started in 2015, multiple  . Family history of breast cancer   . Family history of colon cancer   . Fibroid   . Heart murmur   . HPV in female   . 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 Hypothyroidism   . IBS (irritable bowel syndrome)   . Mitral valve prolapse   . Personal history of malignant neoplasm of breast   . Thyroid disease      Assessment: 60 y/o F on apixaban PTA for history of DVT, she was on Lovenox bridging for a few days prior to surgery on 6/6, we are now starting heparin post-operatively. Should be able to use heparin level only, but will also check aPTT with first heparin level check to be sure.   PM f/u > initial aPTT and heparin level both slightly below goal range.  No overt bleeding or complications noted.  No known issues with  IV infusion.  Goal of Therapy:  Heparin level 0.3-0.7 units/ml aPTT 66-102 seconds Monitor platelets by anticoagulation protocol: Yes   Plan:  Increase IV heparin to 1350 units/hr. Repeat heparin level in 8 hrs. Daily heparin level and CBC. F/u plans to resume Eliquis in the AM.  [place] Crane, Vena Austria, BCPS, [facility] Clinical Pharmacist  07/09/2020 4:23 PM   [facility] pharmacy phone numbers are listed on [email or web address]

## 2020-07-09 NOTE — Progress Notes (Signed)
Saw patient in her hospital room today.  Overall she is doing well.  Appropriate output from her drains.  On exam her skin looks healthy with no signs of any compromise at this point.  No obvious subcutaneous fluid or swelling.  She has been started on heparin this morning and will continue that to help prevent blood clots.  If everything goes well the plan is for her to go home tomorrow and restart her Eliquis.

## 2020-07-09 NOTE — Plan of Care (Signed)
  Problem: Education: Goal: Knowledge of General Education information will improve Description: Including pain rating scale, medication(s)/side effects and non-pharmacologic comfort measures 07/09/2020 1650 by Bethann Punches, RN Outcome: Progressing 07/09/2020 1650 by Bethann Punches, RN Outcome: Progressing   Problem: Health Behavior/Discharge Planning: Goal: Ability to manage health-related needs will improve 07/09/2020 1650 by Bethann Punches, RN Outcome: Progressing 07/09/2020 1650 by Bethann Punches, RN Outcome: Progressing   Problem: Clinical Measurements: Goal: Ability to maintain clinical measurements within normal limits will improve 07/09/2020 1650 by Bethann Punches, RN Outcome: Progressing 07/09/2020 1650 by Bethann Punches, RN Outcome: Progressing Goal: Will remain free from infection 07/09/2020 1650 by Bethann Punches, RN Outcome: Progressing 07/09/2020 1650 by Bethann Punches, RN Outcome: Progressing Goal: Diagnostic test results will improve 07/09/2020 1650 by Bethann Punches, RN Outcome: Progressing 07/09/2020 1650 by Bethann Punches, RN Outcome: Progressing Goal: Respiratory complications will improve 07/09/2020 1650 by Bethann Punches, RN Outcome: Progressing 07/09/2020 1650 by Bethann Punches, RN Outcome: Progressing Goal: Cardiovascular complication will be avoided 07/09/2020 1650 by Bethann Punches, RN Outcome: Progressing 07/09/2020 1650 by Bethann Punches, RN Outcome: Progressing   Problem: Activity: Goal: Risk for activity intolerance will decrease 07/09/2020 1650 by Bethann Punches, RN Outcome: Progressing 07/09/2020 1650 by Bethann Punches, RN Outcome: Progressing   Problem: Nutrition: Goal: Adequate nutrition will be maintained 07/09/2020 1650 by Bethann Punches, RN Outcome: Progressing 07/09/2020 1650 by Bethann Punches, RN Outcome: Progressing   Problem: Coping: Goal: Level of anxiety will decrease 07/09/2020 1650 by Bethann Punches, RN Outcome:  Progressing 07/09/2020 1650 by Bethann Punches, RN Outcome: Progressing   Problem: Elimination: Goal: Will not experience complications related to bowel motility 07/09/2020 1650 by Bethann Punches, RN Outcome: Progressing 07/09/2020 1650 by Bethann Punches, RN Outcome: Progressing Goal: Will not experience complications related to urinary retention 07/09/2020 1650 by Bethann Punches, RN Outcome: Progressing 07/09/2020 1650 by Bethann Punches, RN Outcome: Progressing   Problem: Pain Managment: Goal: General experience of comfort will improve 07/09/2020 1650 by Bethann Punches, RN Outcome: Progressing 07/09/2020 1650 by Bethann Punches, RN Outcome: Progressing   Problem: Safety: Goal: Ability to remain free from injury will improve 07/09/2020 1650 by Bethann Punches, RN Outcome: Progressing 07/09/2020 1650 by Bethann Punches, RN Outcome: Progressing   Problem: Skin Integrity: Goal: Risk for impaired skin integrity will decrease 07/09/2020 1650 by Bethann Punches, RN Outcome: Progressing 07/09/2020 1650 by Bethann Punches, RN Outcome: Progressing   Problem: Education: Goal: Knowledge of the prescribed therapeutic regimen will improve Outcome: Progressing Goal: Understanding of sexual limitations or changes related to disease process or condition will improve Outcome: Progressing Goal: Individualized Educational Video(s) Outcome: Progressing   Problem: Self-Concept: Goal: Communication of feelings regarding changes in body function or appearance will improve Outcome: Progressing   Problem: Skin Integrity: Goal: Demonstration of wound healing without infection will improve Outcome: Progressing

## 2020-07-10 DIAGNOSIS — C50911 Malignant neoplasm of unspecified site of right female breast: Secondary | ICD-10-CM | POA: Diagnosis not present

## 2020-07-10 LAB — CBC
HCT: 29.1 % — ABNORMAL LOW (ref 36.0–46.0)
Hemoglobin: 9.5 g/dL — ABNORMAL LOW (ref 12.0–15.0)
MCH: 29.1 pg (ref 26.0–34.0)
MCHC: 32.6 g/dL (ref 30.0–36.0)
MCV: 89.3 fL (ref 80.0–100.0)
Platelets: 194 10*3/uL (ref 150–400)
RBC: 3.26 MIL/uL — ABNORMAL LOW (ref 3.87–5.11)
RDW: 14.9 % (ref 11.5–15.5)
WBC: 9.6 10*3/uL (ref 4.0–10.5)
nRBC: 0 % (ref 0.0–0.2)

## 2020-07-10 LAB — HEPARIN LEVEL (UNFRACTIONATED): Heparin Unfractionated: 0.48 IU/mL (ref 0.30–0.70)

## 2020-07-10 MED ORDER — METHOCARBAMOL 500 MG PO TABS: 500.0000 mg | ORAL_TABLET | Freq: Four times a day (QID) | ORAL | 1 refills | Status: DC | PRN

## 2020-07-10 MED ORDER — APIXABAN 5 MG PO TABS
5.0000 mg | ORAL_TABLET | Freq: Two times a day (BID) | ORAL | Status: DC
  Administered 2020-07-10: 5 mg via ORAL
  Filled 2020-07-10: qty 1

## 2020-07-10 MED ORDER — OXYCODONE HCL 5 MG PO TABS: 5.0000 mg | ORAL_TABLET | Freq: Four times a day (QID) | ORAL | 0 refills | Status: DC | PRN

## 2020-07-10 NOTE — Discharge Summary (Signed)
Physician Discharge Summary  Patient ID: Fruma Africa MRN: [id number] DOB/AGE: 1960/06/26 60 y.o.  Admit date: 07/08/2020 Discharge date: 07/10/2020  Admission Diagnoses: Breast cancer VTE  Discharge Diagnoses:  Active Problems:   Breast cancer [facility])   Discharged Condition: good  Hospital Course: 21 yof s/p bilateral mastectomies and sn biopsy with implant placement.  She has done well.  I started iv heparin POD 1 and her hct has remained stable. Drains with expected output.  Hct remained stable POD 2 and will plan to dc home on eliquis.    Consults: None  Significant Diagnostic Studies: none  Treatments: surgery: bilateral mastectomies, ax sn biopsy, implants  Discharge Exam: Blood pressure (!) 111/55, pulse 72, temperature 98.9 F (37.2 C), temperature source Oral, resp. rate 17, height 5\' 8"  (1.727 m), weight 108.5 kg, SpO2 96 %. Drains as expected No hematoma  Disposition: Discharge disposition: 01-Home or Self Care        Allergies as of 07/10/2020      Reactions   Succinylcholine Other (See Comments)   Muscle aching 18 years ago from 07/02/20   Sulfa Antibiotics Rash   And swelling generalized not in throat      Medication List    TAKE these medications   albuterol 108 (90 Base) MCG/ACT inhaler Commonly known as: VENTOLIN HFA Inhale 1 puff into the lungs every 6 (six) hours as needed for wheezing or shortness of breath.   amLODipine 5 MG tablet Commonly known as: NORVASC Take 5 mg by mouth daily.   anastrozole 1 MG tablet Commonly known as: ARIMIDEX Take 1 tablet (1 mg total) by mouth daily.   CALCIUM CITRATE PO Take 1 tablet by mouth daily.   CULTURELLE PO Take 1 capsule by mouth daily.   DULoxetine 30 MG capsule Commonly known as: CYMBALTA Take 3 capsules (90 mg total) by mouth daily. What changed:   how much to take  additional instructions   DULoxetine 60 MG capsule Commonly known as: CYMBALTA Take 60 mg by mouth daily. Take with  30mg  capsules to equal 90mg  What changed: Another medication with the same name was changed. Make sure you understand how and when to take each.   Eliquis 5 MG Tabs tablet Generic drug: apixaban TAKE 1 TABLET(5 MG) BY MOUTH TWICE DAILY   enoxaparin 100 MG/ML injection Commonly known as: LOVENOX Inject 1 mL (100 mg total) into the skin every 12 (twelve) hours.   fluticasone 50 MCG/ACT nasal spray Commonly known as: FLONASE Place 1 spray into both nostrils daily.   gabapentin 600 MG tablet Commonly known as: NEURONTIN Take 600 mg by mouth 2 (two) times daily.   ipratropium 0.03 % nasal spray Commonly known as: ATROVENT Place 2 sprays into both nostrils daily.   levothyroxine 50 MCG tablet Commonly known as: SYNTHROID Take 50 mcg by mouth every morning.   methocarbamol 500 MG tablet Commonly known as: ROBAXIN Take 1 tablet (500 mg total) by mouth every 6 (six) hours as needed for muscle spasms.   Moderna COVID-19 Vaccine 100 MCG/0.5ML injection Generic drug: COVID-19 mRNA vaccine (Moderna) Inject into the muscle.   oxyCODONE 5 MG immediate release tablet Commonly known as: Oxy IR/ROXICODONE Take 1 tablet (5 mg total) by mouth every 6 (six) hours as needed for moderate pain, severe pain or breakthrough pain.   pantoprazole 40 MG tablet Commonly known as: Protonix Take 1 tablet (40 mg total) by mouth daily.   valACYclovir 500 MG tablet Commonly known as: VALTREX Take 1 tablet (  500 mg total) by mouth daily.   Vitamin D-3 25 MCG (1000 UT) Caps Take 1,000 Units by mouth daily.       Follow-up Information    Rolm Bookbinder, MD Follow up in 2 day(s).   Specialty: General Surgery Contact information: [address] [place] [address] [phone]               Signed: Rolm Bookbinder 07/10/2020, 8:06 AM

## 2020-07-10 NOTE — Progress Notes (Signed)
[place] for Heparin (Apixaban PTA) Indication: History of DVT  Allergies  Allergen Reactions  . Succinylcholine Other (See Comments)    Muscle aching  18 years ago from 07/02/20  . Sulfa Antibiotics Rash    And swelling generalized not in throat    Patient Measurements: Height: 5' 8"  (172.7 cm) Weight: 108.5 kg (239 lb 3.2 oz) IBW/kg (Calculated) : 63.9  Vital Signs: Temp: 98.4 F (36.9 C) (06/07 2033) Temp Source: Oral (06/07 2033) BP: 112/68 (06/07 2033) Pulse Rate: 83 (06/07 2033)  Labs: Recent Labs    07/09/20 0731 07/09/20 1528 07/10/20 0150  HGB 9.7*  --  9.5*  HCT 29.8*  --  29.1*  PLT 227  --  194  APTT  --  40*  --   HEPARINUNFRC  --  0.27* 0.48    Estimated Creatinine Clearance: 97.7 mL/min (by C-G formula based on SCr of 0.7 mg/dL).   Medical History: Past Medical History:  Diagnosis Date  . Anxiety   . Asthma   . Breast cancer ([place]) 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, her-2 positive, ER/PR negative  . Clotting disorder ([place])    testing was negative, h/o DVT while on treatment  . Complex regional pain syndrome i of right lower limb    RDS  . DVT (deep venous thrombosis) ([place])    started in 2015, multiple  . Family history of breast cancer   . Family history of colon cancer   . Fibroid   . Heart murmur   . HPV in female   . 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 Hypothyroidism   . IBS (irritable bowel syndrome)   . Mitral valve prolapse   . Personal history of malignant neoplasm of breast   . Thyroid disease      Assessment: 60 y/o F on apixaban PTA for history of DVT, she was on Lovenox bridging for a few days prior to surgery on 6/6, we are now starting heparin post-operatively. Should be able to use heparin level only, but will also check aPTT with first heparin level check to be sure.   6/8 AM update:  Heparin level therapeutic after rate increase  Goal of Therapy:  Heparin  level 0.3-0.7 units/mL Monitor platelets by anticoagulation protocol: Yes   Plan:  Cont heparin at 1350 units/hr 1200 confirmatory heparin level Monitor for bleeding  Narda Bonds, PharmD, BCPS Clinical Pharmacist Phone: [phone]

## 2020-07-10 NOTE — Discharge Instructions (Signed)
Activity: As tolerated, but avoid strenuous activity until follow up visit.  Diet: Regular  Wound Care:  Can shower normally starting today.  Redress the wound as needed for comfort.  Recommend reapplying wrap or sports bra after showers to maintain support and gentle compression.    Special Instructions:  Call our office if any unusual problems occur such as pain, excessive bleeding, unrelieved nausea/vomiting, fever &/or chills.  Follow-up appointment: Scheduled for next week.

## 2020-07-11 LAB — SURGICAL PATHOLOGY

## 2020-07-15 ENCOUNTER — Encounter: Payer: Self-pay | Admitting: *Deleted

## 2020-07-17 NOTE — Progress Notes (Signed)
Patient Care Team: Sierra Low, MD as PCP - General (Internal Medicine) Sierra Germany, RN as Oncology Nurse Navigator Sierra Kaufmann, RN as Oncology Nurse Navigator Sierra Lose, MD as Consulting Physician (Hematology and Oncology)  DIAGNOSIS:    ICD-10-CM   1. Malignant neoplasm of upper-outer quadrant of right breast in female, estrogen receptor negative ([name])  C50.411    Z17.1       SUMMARY OF ONCOLOGIC HISTORY: Oncology History  Malignant neoplasm of upper-outer quadrant of right breast in female, estrogen receptor negative ([name])  08/27/2012 Initial Diagnosis   Stage 1a IDC with DCIS (T2N0M0) : ER-, PR-, HER positive 3+, Ki67 20-25%   08/27/2012 Cancer Staging   Staging form: Breast, AJCC 8th Edition - Clinical stage from 08/27/2012: Stage IIA (cT2, cN0, cM0, G3, ER-, PR-, HER2+) - Signed by Sierra Lose, MD on 11/17/2019    09/06/2012 Breast MRI   Right breast, lateral to the nipple, 10x14x64m mass with mildly irregular borders    09/10/2012 Echocardiogram   EF 55-60%     Chemotherapy   Neoadjuvant Taxotere/Carboplatin/Herceptin/Perjeta followed by adjuvant Herceptin maintenance    01/09/2013 Breast MRI   Complete pathological response to neoadjuvant treatment: right breast mass previously noted at 9:00 position no longer identified.    02/13/2013 Surgery   Right breast lumpectomy: benign parenchyma with some stromal fibrosis consistent with previous tumor site, focal area of atypical lobular hyperplasia, all lymph nodes negative.      Radiation Therapy   Adjuvant radiation    04/06/2013 Imaging   DEXA: T-score of -1.1, osteopenia     09/25/2016 Initial Biopsy   Columnar cell alteration with atypia, foreign body granuloma    05/10/2020 Relapse/Recurrence   Breast MRI detected new linear non-mass enhancement right breast 2.1 cm, non-mass enhancement centrally left breast spanning 10.5 cm, left breast nipple areolar complex enhancement: Biopsy right breast:  Grade 2 invasive lobular cancer.  ER/PR positive HER-2 equivocal by IHC FISH pending, Ki-67 1% left breast biopsy anterior fibrocystic change, posterior atypical lobular hyperplasia/LCIS   05/26/2020 Genetic Testing   Negative genetic testing on the CancerNext-Expanded+RNAinsight panel.  The CancerNext-Expanded gene panel [facility] includes sequencing and rearrangement analysis for the following 77 genes: AIP, ALK, APC*, ATM*, AXIN2, BAP1, BARD1, BLM, BMPR1A, BRCA1*, BRCA2*, BRIP1*, CDC73, CDH1*, CDK4, CDKN1B, CDKN2A, CHEK2*, CTNNA1, DICER1, FANCC, FH, FLCN, GALNT12, KIF1B, LZTR1, MAX, MEN1, MET, MLH1*, MSH2*, MSH3, MSH6*, MUTYH*, NBN, NF1*, NF2, NTHL1, PALB2*, PHOX2B, PMS2*, POT1, PRKAR1A, PTCH1, PTEN*, RAD51C*, RAD51D*, RB1, RECQL, RET, SDHA, SDHAF2, SDHB, SDHC, SDHD, SMAD4, SMARCA4, SMARCB1, SMARCE1, STK11, SUFU, TMEM127, TP53*, TSC1, TSC2, VHL and XRCC2 (sequencing and deletion/duplication); EGFR, EGLN1, HOXB13, KIT, MITF, PDGFRA, POLD1, and POLE (sequencing only); EPCAM and GREM1 (deletion/duplication only). DNA and RNA analyses performed for * genes. The report date is May 26, 2020.      CHIEF COMPLIANT: Follow-up right breast cancer, left breast ALH/LCIS  INTERVAL HISTORY: Sierra Perez is a 60y.o. with above-mentioned history of right breast cancer previously treated with neoadjuvant chemotherapy followed by lumpectomy and radiation for HER-2 positive breast cancer. She has been in remission until a recurrence found 05/10/2020. Biopsy of bilateral mastectomy on 07/08/2020 showed no malignancy in the left breast and invasive lobular carcinoma, grade 2, spanning 0.9 cm with margins negative for carcinoma in the right breast. She reports to the clinic today for follow-up and to discuss further treatment.  She complains of being very sore in the chest and she is run out  of her oxycodone.  She still has muscle relaxants.  Because of the bleeding consult she cannot take Motrin.   Tylenol does not do much for her.  ALLERGIES:  is allergic to succinylcholine and sulfa antibiotics.  MEDICATIONS:  Current Outpatient Medications  Medication Sig Dispense Refill   albuterol (VENTOLIN HFA) 108 (90 Base) MCG/ACT inhaler Inhale 1 puff into the lungs every 6 (six) hours as needed for wheezing or shortness of breath.     amLODipine (NORVASC) 5 MG tablet Take 5 mg by mouth daily.     anastrozole (ARIMIDEX) 1 MG tablet Take 1 tablet (1 mg total) by mouth daily. 90 tablet 3   CALCIUM CITRATE PO Take 1 tablet by mouth daily.     Cholecalciferol (VITAMIN D-3) 25 MCG (1000 UT) CAPS Take 1,000 Units by mouth daily.     COVID-19 mRNA vaccine, Moderna, 100 MCG/0.5ML injection Inject into the muscle. (Patient not taking: No sig reported) 0.25 mL 0   DULoxetine (CYMBALTA) 30 MG capsule Take 3 capsules (90 mg total) by mouth daily. (Patient taking differently: Take 30 mg by mouth daily. Take with 93m capsule to equal 970m     DULoxetine (CYMBALTA) 60 MG capsule Take 60 mg by mouth daily. Take with 309mapsules to equal 28m72m  ELIQUIS 5 MG TABS tablet TAKE 1 TABLET(5 MG) BY MOUTH TWICE DAILY 180 tablet 0   enoxaparin (LOVENOX) 100 MG/ML injection Inject 1 mL (100 mg total) into the skin every 12 (twelve) hours. 6 mL 0   fluticasone (FLONASE) 50 MCG/ACT nasal spray Place 1 spray into both nostrils daily.     gabapentin (NEURONTIN) 600 MG tablet Take 600 mg by mouth 2 (two) times daily.     ipratropium (ATROVENT) 0.03 % nasal spray Place 2 sprays into both nostrils daily.     Lactobacillus Rhamnosus, GG, (CULTURELLE PO) Take 1 capsule by mouth daily.     levothyroxine (SYNTHROID) 50 MCG tablet Take 50 mcg by mouth every morning.     methocarbamol (ROBAXIN) 500 MG tablet Take 1 tablet (500 mg total) by mouth every 6 (six) hours as needed for muscle spasms. 20 tablet 1   oxyCODONE (OXY IR/ROXICODONE) 5 MG immediate release tablet Take 1 tablet (5 mg total) by mouth every 6 (six) hours as  needed for moderate pain, severe pain or breakthrough pain. 12 tablet 0   pantoprazole (PROTONIX) 40 MG tablet Take 1 tablet (40 mg total) by mouth daily.     valACYclovir (VALTREX) 500 MG tablet Take 1 tablet (500 mg total) by mouth daily. 30 tablet 12   No current facility-administered medications for this visit.    PHYSICAL EXAMINATION: ECOG PERFORMANCE STATUS: 1 - Symptomatic but completely ambulatory  Vitals:   07/18/20 1408  BP: 132/63  Pulse: 72  Resp: 17  Temp: 99 F (37.2 C)  SpO2: 100%   Filed Weights   07/18/20 1408  Weight: 231 lb 12.8 oz (105.1 kg)     LABORATORY DATA:  I have reviewed the data as listed CMP Latest Ref Rng & Units 07/02/2020  Glucose 70 - 99 mg/dL 157(H)  BUN 6 - 20 mg/dL 16  Creatinine 0.44 - 1.00 mg/dL 0.70  Sodium 135 - 145 mmol/L 135  Potassium 3.5 - 5.1 mmol/L 4.2  Chloride 98 - 111 mmol/L 102  CO2 22 - 32 mmol/L 27  Calcium 8.9 - 10.3 mg/dL 9.2    Lab Results  Component Value Date   WBC 9.6 07/10/2020  HGB 9.5 (L) 07/10/2020   HCT 29.1 (L) 07/10/2020   MCV 89.3 07/10/2020   PLT 194 07/10/2020    ASSESSMENT & PLAN:  Malignant neoplasm of upper-outer quadrant of right breast in female, estrogen receptor negative ([place]) 08/27/2012: Right breast: Stage Ia IDC with DCIS T2 N0 M0 ER negative PR negative, HER-2 positive, Ki-67 20 to 25% status post neoadjuvant TCHP: Complete pathologic response, adjuvant radiation   05/10/2020: Recurrence/relapse: Right breast: 2.1 cm non-mass enhancement: Biopsy grade 2 invasive lobular cancer ER/PR positive HER-2 equivocal by IHC FISH pending, Ki-67 1% Left breast: 10.5 cm non-mass enhancement anterior biopsy fibrocystic change, posterior biopsy ALH/LCIS   Treatment plan: 1.   07/08/2020 Bilateral mastectomies with reconstruction: Right mastectomy: Grade 2 ILC 0.9 cm, ALH, margins negative, 0/1 lymph, left mastectomy: Benign 2. adjuvant antiestrogen therapy with anastrozole 1 mg daily x7 years Genetic  testing ------------------------------------------------------------ Pathology counseling: I discussed the final pathology report of the patient provided  a copy of this report. I discussed the margins.  We also discussed the final staging along with previously performed ER/PR testing.  Current treatment: Adjuvant antiestrogen therapy with anastrozole 1 mg daily x7 years She has been taking anastrozole and has been tolerating it extremely well. Supplements: Because she developed mastectomies there is no role of routine imaging.  We might consider doing a breast MRI every 3 years for implant integrity.  Return to clinic in 6 months for follow-up with me and after that we can see her once every year.    No orders of the defined types were placed in this encounter.  The patient has a good understanding of the overall plan. she agrees with it. she will call with any problems that may develop before the next visit here.  Total time spent: 20 mins including face to face time and time spent for planning, charting and coordination of care  Rulon Eisenmenger, MD, MPH 07/18/2020  I, Thana Ates, am acting as scribe for Dr. Nicholas Perez.  I have reviewed the above documentation for accuracy and completeness, and I agree with the above. Okay she is PCR Stanton Kidney does not trust her we just need her to schedule there is is what I would use check liver function she has been okay on that never prescribed it but he had good with I am sure we can find a dose or abdominal

## 2020-07-18 ENCOUNTER — Encounter: Payer: Self-pay | Admitting: *Deleted

## 2020-07-18 ENCOUNTER — Ambulatory Visit (INDEPENDENT_AMBULATORY_CARE_PROVIDER_SITE_OTHER): Payer: 59 | Admitting: Plastic Surgery

## 2020-07-18 ENCOUNTER — Other Ambulatory Visit: Payer: Self-pay

## 2020-07-18 ENCOUNTER — Inpatient Hospital Stay: Payer: 59 | Attending: Hematology and Oncology | Admitting: Hematology and Oncology

## 2020-07-18 DIAGNOSIS — Z923 Personal history of irradiation: Secondary | ICD-10-CM | POA: Insufficient documentation

## 2020-07-18 DIAGNOSIS — Z171 Estrogen receptor negative status [ER-]: Secondary | ICD-10-CM | POA: Diagnosis not present

## 2020-07-18 DIAGNOSIS — C50411 Malignant neoplasm of upper-outer quadrant of right female breast: Secondary | ICD-10-CM

## 2020-07-18 DIAGNOSIS — Z79811 Long term (current) use of aromatase inhibitors: Secondary | ICD-10-CM | POA: Insufficient documentation

## 2020-07-18 DIAGNOSIS — Z79899 Other long term (current) drug therapy: Secondary | ICD-10-CM | POA: Diagnosis not present

## 2020-07-18 DIAGNOSIS — Z7901 Long term (current) use of anticoagulants: Secondary | ICD-10-CM | POA: Insufficient documentation

## 2020-07-18 DIAGNOSIS — Z9221 Personal history of antineoplastic chemotherapy: Secondary | ICD-10-CM | POA: Insufficient documentation

## 2020-07-18 MED ORDER — CEPHALEXIN 500 MG PO CAPS: 500.0000 mg | ORAL_CAPSULE | Freq: Four times a day (QID) | ORAL | 0 refills | Status: DC

## 2020-07-18 MED ORDER — GABAPENTIN 800 MG PO TABS: 800.0000 mg | ORAL_TABLET | Freq: Two times a day (BID) | ORAL | Status: DC

## 2020-07-18 NOTE — Progress Notes (Signed)
Patient presents about 1 week out from bilateral direct implant breast reconstruction.  She feels good overall.  She has some concerns regarding the shape of her breast but overall feels okay.  Her drains are putting out somewhere between 40 and 60 cc per 24-hour..  On exam her skin all looks to be viable.  There is a small central area of erythema along the incision on the right side which is about a centimeter in size but otherwise it is unremarkable.  There is no drainage.  No obvious subcutaneous fluid.  Drainage fluid is serosanguineous.  We will plan to leave the drains in for another week and I will reevaluate.  I am can add Keflex given the small area of erythema.  I am not convinced that this is infection but want to be cautious.  We had a long discussion about the risks and benefits of the various breast reduction techniques and the desire to avoid a second operation.  This did require sacrificing some of the size of the implant which does affect the ultimate shape for her.  She understands the complexity of her situation and I do think ultimately as the skin relaxes in the swelling laterally goes down her shape will improve.

## 2020-07-18 NOTE — Assessment & Plan Note (Signed)
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   07/08/2020 Bilateral mastectomies with reconstruction: Right mastectomy: Grade 2 ILC 0.9 cm, ALH, margins negative, 0/1 lymph, left mastectomy: Benign 2. adjuvant antiestrogen therapy with anastrozole 1 mg daily x7-10 years Genetic testing ------------------------------------------------------------ Pathology counseling: I discussed the final pathology report of the patient provided  a copy of this report. I discussed the margins.  We also discussed the final staging along with previously performed ER/PR testing.  Current treatment: Adjuvant antiestrogen therapy with anastrozole 1 mg daily x7-10 years  Return to clinic in 3 months for survivorship care plan visit

## 2020-07-20 MED ORDER — AMOXICILLIN-POT CLAVULANATE 875-125 MG PO TABS: 1.0000 | ORAL_TABLET | Freq: Two times a day (BID) | ORAL | 0 refills | Status: DC

## 2020-07-25 ENCOUNTER — Other Ambulatory Visit: Payer: Self-pay

## 2020-07-25 ENCOUNTER — Ambulatory Visit (INDEPENDENT_AMBULATORY_CARE_PROVIDER_SITE_OTHER): Payer: 59 | Admitting: Plastic Surgery

## 2020-07-25 DIAGNOSIS — C50411 Malignant neoplasm of upper-outer quadrant of right female breast: Secondary | ICD-10-CM

## 2020-07-25 DIAGNOSIS — Z171 Estrogen receptor negative status [ER-]: Secondary | ICD-10-CM

## 2020-07-25 NOTE — Addendum Note (Signed)
Addended by: Harl Bowie on: 07/25/2020 03:35 PM   Modules accepted: Orders

## 2020-07-25 NOTE — Progress Notes (Signed)
Patient presents 2 weeks out from direct implant breast reconstruction after bilateral mastectomies.  She feels good overall.  She has some pain and tightness around both surgical sites.  The left drain has tapered off quite a bit so it was removed today.  The right drain is still putting out somewhere between 30 and 60 cc/day over the past week or so.  The drainage is serosanguineous.  Incision lines are intact and while last week there is a faint area of erythema on the right side and the superior skin flap this is resolving and is unlikely to be infectious in nature.  We will have her to continue to wear supportive bra and avoid strenuous activity and I will see her next week to hopefully remove the drain on the right side.

## 2020-07-26 ENCOUNTER — Other Ambulatory Visit: Payer: Self-pay | Admitting: Plastic Surgery

## 2020-07-26 MED ORDER — AMOXICILLIN-POT CLAVULANATE 875-125 MG PO TABS: 1.0000 | ORAL_TABLET | Freq: Two times a day (BID) | ORAL | 0 refills | Status: DC

## 2020-07-29 ENCOUNTER — Telehealth: Payer: Self-pay | Admitting: *Deleted

## 2020-07-29 NOTE — Telephone Encounter (Signed)
-----   Message from Cindra Presume, MD sent at 07/26/2020  2:57 PM EDT ----- Done.  Thanks. ----- Message ----- From: Cyndi Lennert Sent: 07/26/2020   2:21 PM EDT To: Harl Bowie, CMA, [place], RN, #  Hi Dr. Claudia Desanctis-  Patient called to request a refill for her antibiotic prescription.  Her preferred pharmacy is [facility] on [address] in [place].  The phone number for the pharmacy is [phone].  Thanks, Maudie Mercury

## 2020-07-30 ENCOUNTER — Ambulatory Visit: Payer: Self-pay | Admitting: Rehabilitation

## 2020-07-30 ENCOUNTER — Encounter (HOSPITAL_BASED_OUTPATIENT_CLINIC_OR_DEPARTMENT_OTHER): Payer: Self-pay | Admitting: Obstetrics & Gynecology

## 2020-08-01 ENCOUNTER — Ambulatory Visit (INDEPENDENT_AMBULATORY_CARE_PROVIDER_SITE_OTHER): Payer: 59

## 2020-08-01 ENCOUNTER — Telehealth: Payer: Self-pay

## 2020-08-01 ENCOUNTER — Other Ambulatory Visit: Payer: Self-pay

## 2020-08-01 VITALS — BP 130/70 | HR 71 | Temp 98.6°F | Ht 68.0 in | Wt 230.0 lb

## 2020-08-01 DIAGNOSIS — Z9889 Other specified postprocedural states: Secondary | ICD-10-CM

## 2020-08-01 NOTE — Telephone Encounter (Signed)
Call to pt- no answer/left voicemail informing her that Dr. Claudia Desanctis agreed tat she can d/c the RX-Augmentin at this time She is reminded to call for any other questions or concerns

## 2020-08-04 NOTE — Progress Notes (Signed)
08/01/20: patient is in office today for f/u S/P  Bilateral Breast Reconstruction with Breast Implants- following bilateral mastectomies 07/08/20. She reports that she has improved significantly- she is having tenderness- right drain site- & she states that left breast lateral aspect is still painful with full ROM & extending her arm/hand. She indicated that she continues to feel tired at the end of a day- & she confirms that she is not performing any heavy lifting or other strenuous activity. She is performing light PT maneuvers per PT instructions. She continues to use compression bra & the drainage from right drain has been 10-19ml/per 24 hrs for last 4 days. I removed the drain & covered the area with vaseline/gauze taped in place. She has taken Augmentin as per Dr. Keane Scrape orders & denies any redness/fever/chills. The incisions are intact & there is no signs of any infectious process. Pt asked if she needs to complete the 2nd round of antibiotic at this time. I will consult with Dr. Claudia Desanctis for this question. Patient is glad that she was able to have the immediate placement of implants- although she is curious if the shape will change. I informed her that there is still some swelling & that as the implants settle into place- they will most likely become more relaxed/natural in appearance. She will continue to use compression garment & monitor for any complications. Patient requested a work note- that would allow her to return to work on 08/06/20 for 1/2 days for next 2 weeks - then full duty/full days beginning on 08/19/20.  She had discussed this with Dr. Claudia Desanctis at her previous visit. I will have our office staff type this note for her. Patient did inquire if she can swim or submerge in pool- I instructed her to wait until drain site has completely healed - she may do so in approx 2-3 weeks.

## 2020-08-04 NOTE — Patient Instructions (Signed)
Patient will call for any changes/concerns.

## 2020-08-14 ENCOUNTER — Ambulatory Visit: Payer: 59 | Attending: General Surgery | Admitting: Physical Therapy

## 2020-08-14 ENCOUNTER — Encounter: Payer: Self-pay | Admitting: Physical Therapy

## 2020-08-14 ENCOUNTER — Other Ambulatory Visit: Payer: Self-pay

## 2020-08-14 DIAGNOSIS — M6281 Muscle weakness (generalized): Secondary | ICD-10-CM | POA: Diagnosis present

## 2020-08-14 DIAGNOSIS — Z853 Personal history of malignant neoplasm of breast: Secondary | ICD-10-CM | POA: Insufficient documentation

## 2020-08-14 DIAGNOSIS — R293 Abnormal posture: Secondary | ICD-10-CM | POA: Diagnosis present

## 2020-08-14 NOTE — Therapy (Signed)
[place], [place], [address] Phone: [phone]   Fax:  [phone]  Physical Therapy Treatment  Patient Details  Name: Sierra Perez MRN: [id number] Date of Birth: 12/21/1960 Referring Provider (PT): Dr. Donne Hazel   Encounter Date: 08/14/2020   PT End of Session - 08/14/20 0848     Visit Number 2    Number of Visits 4    Date for PT Re-Evaluation 09/11/20    Authorization - Visit Number 2    Authorization - Number of Visits 23    PT Start Time 0806    PT Stop Time 2671    PT Time Calculation (min) 41 min    Activity Tolerance Patient tolerated treatment well    Behavior During Therapy [facility] for tasks assessed/performed             Past Medical History:  Diagnosis Date   Anxiety    Asthma    Breast cancer ([place]) 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, her-2 positive, ER/PR negative   Clotting disorder ([place])    testing was negative, h/o DVT while on treatment   Complex regional pain syndrome i of right lower limb    RDS   DVT (deep venous thrombosis) ([place])    started in 2015, multiple   Family history of breast cancer    Family history of colon cancer    Fibroid    Heart murmur    HPV in female    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   Hypothyroidism    IBS (irritable bowel syndrome)    Mitral valve prolapse    Personal history of malignant neoplasm of breast    Thyroid disease     Past Surgical History:  Procedure Laterality Date   ABDOMINAL HYSTERECTOMY  2010   fibroids, h/o abnormal pap smears   AXILLARY SENTINEL NODE BIOPSY Right 07/08/2020   Procedure: RIGHT AXILLARY SENTINEL NODE BIOPSY;  Surgeon: Rolm Bookbinder, MD;  Location: [place];  Service: General;  Laterality: Right;   BREAST RECONSTRUCTION WITH PLACEMENT OF TISSUE EXPANDER AND FLEX HD (ACELLULAR HYDRATED DERMIS) Bilateral 07/08/2020   Procedure: BILATERAL BREAST RECONSTRUCTION WITH  FLEX HD (ACELLULAR HYDRATED  DERMIS),DIRECT IMPLANT RECONSTRUCTION;  Surgeon: Cindra Presume, MD;  Location: [place];  Service: Plastics;  Laterality: Bilateral;   BREAST SURGERY Right 2015   right lumpectomy with reduction and lift   COLONOSCOPY     DENTAL SURGERY     ESOPHAGOGASTRODUODENOSCOPY     FOOT SURGERY     dislocated toe   LAPAROSCOPIC GASTRIC SLEEVE RESECTION  2014   LAPAROSCOPIC OOPHERECTOMY Bilateral 2016   REPLACEMENT TOTAL KNEE Left 2018   also had 3 prior arthroscopies   TOTAL MASTECTOMY Bilateral 07/08/2020   Procedure: BILATERAL TOTAL MASTECTOMY;  Surgeon: Rolm Bookbinder, MD;  Location: [place];  Service: General;  Laterality: Bilateral;   VENA CAVA FILTER PLACEMENT  2017    There were no vitals filed for this visit.   Subjective Assessment - 08/14/20 0808     Subjective I have been doing the post op exercises some. I feel like my ROM is pretty good but I don't have anything to compare it to.    Pertinent History 2014 Rt breast cancer with lumpectomy with SLNB, radiation, and chemotherapy. Recurrence with bil mastectomy with SLNB on 07/08/20 due to grade 2 ILC ER/PR positive.  Other history includes clotting disorder and DVT history.    Patient Stated Goals get  information from providers    Currently in Pain? No/denies    Pain Score 0-No pain                [facility] PT Assessment - 08/14/20 0001       Assessment   Medical Diagnosis R breast cancer    Onset Date/Surgical Date 07/08/20      Precautions   Precautions Other (comment)    Precaution Comments at risk for lymphedema      Restrictions   Weight Bearing Restrictions No      Balance Screen   Has the patient fallen in the past 6 months No    Has the patient had a decrease in activity level because of a fear of falling?  No    Is the patient reluctant to leave their home because of a fear of falling?  No      Home Environment   Living Environment Private residence    Living Arrangements Alone    Available Help at Discharge  Family;Friend(s)      Prior Function   Vocation Full time employment    Vocation Requirements desk work    Leisure not currently      Cognition   Overall Cognitive Status Within Functional Limits for tasks assessed      AROM   Right Shoulder Extension 62 Degrees    Right Shoulder Flexion 165 Degrees    Right Shoulder ABduction 172 Degrees    Right Shoulder Internal Rotation 63 Degrees    Right Shoulder External Rotation 90 Degrees    Left Shoulder Extension 59 Degrees    Left Shoulder Flexion 165 Degrees    Left Shoulder ABduction 165 Degrees    Left Shoulder Internal Rotation 70 Degrees    Left Shoulder External Rotation 90 Degrees               LYMPHEDEMA/ONCOLOGY QUESTIONNAIRE - 08/14/20 0001       Surgeries   Mastectomy Date 07/08/20    Number Lymph Nodes Removed 2   1 with lumpectomy and 1 with mastectomy     Right Upper Extremity Lymphedema   10 cm Proximal to Olecranon Process 33.5 cm    Olecranon Process 27 cm    10 cm Proximal to Ulnar Styloid Process 22.4 cm    Just Proximal to Ulnar Styloid Process 16.5 cm    Across Hand at [employer] 19.5 cm    At [place] of 2nd Digit 6.5 cm                                PT Education - 08/14/20 0853     Education Details risk of lymphedema, self MLD for right breast, how to gradually increase reps and resistance of exercise to decrease risk of lymphedema    Person(s) Educated Patient    Methods Explanation    Comprehension Verbalized understanding                 PT Long Term Goals - 08/14/20 4536       PT LONG TERM GOAL #1   Title Pt will return to close to baseline shoulder AROM to demonstrate no additional need for PT    Time 12    Period Weeks    Status Achieved      PT LONG TERM GOAL #2   Title Pt will be educated on lymphedema risk reduction and sleeve use  Time 12    Period Weeks    Status On-going      PT LONG TERM GOAL #3   Title Pt will be scheduled for SOZO  surveillance    Time 12    Period Weeks    Status On-going      PT LONG TERM GOAL #4   Title Pt will be independent in Strength ABC program for long term strengthening and stretching    Time 4    Period Weeks    Status New    Target Date 09/11/20                   Plan - 08/14/20 0849     Clinical Impression Statement Pt returns to PT after undergoing a bilateral mastectomy and R SLNB for treatment of recurrent R breast cancer. Pt previously had a R lumpectomy and SLNB and completed chemo and radiation. Her ROM is WFL post op and she does not demonstrate any signs of lymphedema. Pt is eager to get back in to the gym so educated pt on her lymphedema risk and how to progress exercises. Will see pt for 1-2 additional visits to instruct her in Strength ABC program. Also educated pt on self MLD technique to help with some post surgical edema in her R breast. Pt is trying to save as many PT visits as she can for her upcoming TKR which she plans on having later this year.    PT Frequency Biweekly    PT Duration 4 weeks    PT Treatment/Interventions ADLs/Self Care Home Management;Therapeutic exercise;Patient/family education;Manual techniques;Manual lymph drainage    PT Next Visit Plan make sure pt is scheduled for ABC class, instrut in Strength ABC program, make sure she is scheduled for SOZO    PT Home Exercise Plan post op, supine dowel exercises    Consulted and Agree with Plan of Care Patient             Patient will benefit from skilled therapeutic intervention in order to improve the following deficits and impairments:  Decreased knowledge of precautions, Postural dysfunction, Decreased strength  Visit Diagnosis: Muscle weakness (generalized)  Abnormal posture  History of malignant neoplasm of right breast     Problem List Patient Active Problem List   Diagnosis Date Noted   Breast cancer ([place]) 07/08/2020   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   H/O total  hysterectomy 06/11/2020   Genetic testing 05/27/2020   Family history of breast cancer    Family history of colon cancer    Personal history of malignant neoplasm of breast    Unilateral primary osteoarthritis, right knee 02/26/2020   Unilateral primary osteoarthritis, 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   DVT (deep venous thrombosis) ([place]) 05/04/2018   Malignant neoplasm of upper-outer quadrant of right breast in female, estrogen receptor negative ([place]) 05/03/2018    Allyson Sabal [facility] 08/14/2020, 8:53 AM  [place], [place], [address] Phone: [phone]   Fax:  [phone]  Name: Sierra Perez MRN: [id number] Date of Birth: 11-08-1960  Manus Gunning, PT 08/14/20 8:54 AM

## 2020-08-14 NOTE — Patient Instructions (Signed)
Shoulder: Flexion (Supine)    With hands shoulder width apart, slowly lower dowel to floor behind head. Do not let elbows bend. Keep back flat. Hold _5-30___ seconds. Repeat _10___ times. Do __2__ sessions per day. CAUTION: Stretch slowly and gently.  Copyright  VHI. All rights reserved.  Shoulder: Abduction (Supine)    With right arm flat on floor, hold dowel in palm. Slowly move arm up to side of head by pushing with opposite arm. Do not let elbow bend. Hold _5-30___ seconds. Repeat _10___ times. Do __2__ sessions per day. Repeat on opposite side.  CAUTION: Stretch slowly and gently.  Copyright  VHI. All rights reserved.

## 2020-08-15 ENCOUNTER — Ambulatory Visit: Payer: Self-pay | Admitting: Rehabilitation

## 2020-08-22 ENCOUNTER — Other Ambulatory Visit: Payer: Self-pay

## 2020-08-22 ENCOUNTER — Ambulatory Visit: Payer: 59 | Admitting: Physical Therapy

## 2020-08-22 ENCOUNTER — Encounter: Payer: Self-pay | Admitting: Physical Therapy

## 2020-08-22 DIAGNOSIS — M6281 Muscle weakness (generalized): Secondary | ICD-10-CM

## 2020-08-22 DIAGNOSIS — R293 Abnormal posture: Secondary | ICD-10-CM

## 2020-08-22 DIAGNOSIS — Z853 Personal history of malignant neoplasm of breast: Secondary | ICD-10-CM

## 2020-08-22 NOTE — Therapy (Signed)
[place], [place], [address] Phone: [phone]   Fax:  [phone]  Physical Therapy Treatment  Patient Details  Name: Sierra Perez MRN: [id number] Date of Birth: 06-13-1960 Referring Provider (PT): Dr. Donne Hazel   Encounter Date: 08/22/2020   PT End of Session - 08/22/20 0902     Visit Number 3    Number of Visits 4    Date for PT Re-Evaluation 09/11/20    Authorization - Visit Number 3    Authorization - Number of Visits 23    PT Start Time 0806    PT Stop Time 0900    PT Time Calculation (min) 54 min    Activity Tolerance Patient tolerated treatment well    Behavior During Therapy [facility] for tasks assessed/performed             Past Medical History:  Diagnosis Date   Anxiety    Asthma    Breast cancer ([place]) 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, her-2 positive, ER/PR negative   Clotting disorder ([place])    testing was negative, h/o DVT while on treatment   Complex regional pain syndrome i of right lower limb    RDS   DVT (deep venous thrombosis) ([place])    started in 2015, multiple   Family history of breast cancer    Family history of colon cancer    Fibroid    Heart murmur    HPV in female    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   Hypothyroidism    IBS (irritable bowel syndrome)    Mitral valve prolapse    Personal history of malignant neoplasm of breast    Thyroid disease     Past Surgical History:  Procedure Laterality Date   ABDOMINAL HYSTERECTOMY  2010   fibroids, h/o abnormal pap smears   AXILLARY SENTINEL NODE BIOPSY Right 07/08/2020   Procedure: RIGHT AXILLARY SENTINEL NODE BIOPSY;  Surgeon: Rolm Bookbinder, MD;  Location: [place];  Service: General;  Laterality: Right;   BREAST RECONSTRUCTION WITH PLACEMENT OF TISSUE EXPANDER AND FLEX HD (ACELLULAR HYDRATED DERMIS) Bilateral 07/08/2020   Procedure: BILATERAL BREAST RECONSTRUCTION WITH  FLEX HD (ACELLULAR HYDRATED  DERMIS),DIRECT IMPLANT RECONSTRUCTION;  Surgeon: Cindra Presume, MD;  Location: [place];  Service: Plastics;  Laterality: Bilateral;   BREAST SURGERY Right 2015   right lumpectomy with reduction and lift   COLONOSCOPY     DENTAL SURGERY     ESOPHAGOGASTRODUODENOSCOPY     FOOT SURGERY     dislocated toe   LAPAROSCOPIC GASTRIC SLEEVE RESECTION  2014   LAPAROSCOPIC OOPHERECTOMY Bilateral 2016   REPLACEMENT TOTAL KNEE Left 2018   also had 3 prior arthroscopies   TOTAL MASTECTOMY Bilateral 07/08/2020   Procedure: BILATERAL TOTAL MASTECTOMY;  Surgeon: Rolm Bookbinder, MD;  Location: [place];  Service: General;  Laterality: Bilateral;   VENA CAVA FILTER PLACEMENT  2017    There were no vitals filed for this visit.   Subjective Assessment - 08/22/20 0807     Subjective I am doing ok. I have to say last night was the first night I slept without the compression bra and it does not feel so great today.    Pertinent History 2014 Rt breast cancer with lumpectomy with SLNB, radiation, and chemotherapy. Recurrence with bil mastectomy with SLNB on 07/08/20 due to grade 2 ILC ER/PR positive.  Other history includes clotting disorder and DVT history.    Patient Stated  Goals get information from providers    Currently in Pain? Yes    Pain Score 3     Pain Location Chest    Pain Orientation Right;Left    Pain Descriptors / Indicators Sore;Tightness    Pain Type Acute pain    Pain Onset Today    Pain Frequency Constant    Aggravating Factors  exercises    Pain Relieving Factors compression bra    Effect of Pain on Daily Activities no effect                               [facility] Adult PT Treatment/Exercise - 08/22/20 0001       Exercises   Exercises Other Exercises    Other Exercises  Instructed pt in entire Strength ABC class today and had her return demonstrate all exercises and stretches - did not have pt do Superwoman due to knee replacement and substituted pelvic tilts for ab  curls      Manual Therapy   Manual Therapy Edema management    Edema Management reassessed pt's right breast edema and it is still present with increased core size, created foam chip pack for pt to wear in her bra for additional compression                         PT Long Term Goals - 08/14/20 0852       PT LONG TERM GOAL #1   Title Pt will return to close to baseline shoulder AROM to demonstrate no additional need for PT    Time 12    Period Weeks    Status Achieved      PT LONG TERM GOAL #2   Title Pt will be educated on lymphedema risk reduction and sleeve use    Time 12    Period Weeks    Status On-going      PT LONG TERM GOAL #3   Title Pt will be scheduled for SOZO surveillance    Time 12    Period Weeks    Status On-going      PT LONG TERM GOAL #4   Title Pt will be independent in Strength ABC program for long term strengthening and stretching    Time 4    Period Weeks    Status New    Target Date 09/11/20                   Plan - 08/22/20 0902     Clinical Impression Statement Pt returns to PT. Instructed pt in entire Strength ABC program including all stretches and exercises. Educated pt in appropriate way to progress exercises to help decrease risk of lymphedema and pt verbalized understand. Answered pt's questions regarding lymphedema and signed pt up for ABC class for additional education on lymphedema risk reduction practices. Assessed pt's right breast edema and it is still present with increased pore size and fibrosis. Created foam chip pack for pt to wear in her bra. Briefly educated pt about self MLD technique. If pt is still demosntrating edema at next session will update POC and add goals as appropriate to address.    PT Frequency Biweekly    PT Duration 4 weeks    PT Treatment/Interventions ADLs/Self Care Home Management;Therapeutic exercise;Patient/family education;Manual techniques;Manual lymph drainage    PT Next Visit Plan  indep with Strength ABC, update POC and add goals for  breast edema and tightness?    PT Home Exercise Plan post op, supine dowel exercises, Strnegth ABC    Consulted and Agree with Plan of Care Patient             Patient will benefit from skilled therapeutic intervention in order to improve the following deficits and impairments:  Decreased knowledge of precautions, Postural dysfunction, Decreased strength  Visit Diagnosis: Muscle weakness (generalized)  Abnormal posture  History of malignant neoplasm of right breast     Problem List Patient Active Problem List   Diagnosis Date Noted   Breast cancer ([place]) 07/08/2020   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   H/O total hysterectomy 06/11/2020   Genetic testing 05/27/2020   Family history of breast cancer    Family history of colon cancer    Personal history of malignant neoplasm of breast    Unilateral primary osteoarthritis, right knee 02/26/2020   Unilateral primary osteoarthritis, 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   DVT (deep venous thrombosis) ([place]) 05/04/2018   Malignant neoplasm of upper-outer quadrant of right breast in female, estrogen receptor negative ([place]) 05/03/2018    Allyson Sabal [facility] 08/22/2020, 9:06 AM  [place], [place], [address] Phone: [phone]   Fax:  [phone]  Name: Reyes Fifield MRN: [id number] Date of Birth: Jun 08, 1960   Manus Gunning, PT 08/22/20 9:06 AM

## 2020-09-05 ENCOUNTER — Encounter: Payer: Self-pay | Admitting: Orthopaedic Surgery

## 2020-09-09 ENCOUNTER — Other Ambulatory Visit: Payer: Self-pay

## 2020-09-09 ENCOUNTER — Encounter: Payer: Self-pay | Admitting: Physical Therapy

## 2020-09-09 ENCOUNTER — Ambulatory Visit: Payer: 59 | Attending: General Surgery | Admitting: Physical Therapy

## 2020-09-09 ENCOUNTER — Telehealth: Payer: Self-pay | Admitting: Plastic Surgery

## 2020-09-09 DIAGNOSIS — Z853 Personal history of malignant neoplasm of breast: Secondary | ICD-10-CM | POA: Insufficient documentation

## 2020-09-09 DIAGNOSIS — R293 Abnormal posture: Secondary | ICD-10-CM | POA: Insufficient documentation

## 2020-09-09 DIAGNOSIS — I972 Postmastectomy lymphedema syndrome: Secondary | ICD-10-CM | POA: Insufficient documentation

## 2020-09-09 DIAGNOSIS — M6281 Muscle weakness (generalized): Secondary | ICD-10-CM | POA: Insufficient documentation

## 2020-09-09 DIAGNOSIS — L599 Disorder of the skin and subcutaneous tissue related to radiation, unspecified: Secondary | ICD-10-CM | POA: Diagnosis present

## 2020-09-09 NOTE — Therapy (Signed)
[place], [place], [address] Phone: [phone]   Fax:  [phone]  Physical Therapy Treatment  Patient Details  Name: Sierra Perez MRN: [id number] Date of Birth: October 26, 1960 Referring Provider (PT): Dr. 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, her-2 positive, ER/PR negative   Clotting disorder ([place])    testing was negative, h/o DVT while on treatment   Complex regional pain syndrome i of right lower limb    RDS   DVT (deep venous thrombosis) ([place])    started in 2015, multiple   Family history of breast cancer    Family history of colon cancer    Fibroid    Heart murmur    HPV in female    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   Hypothyroidism    IBS (irritable bowel syndrome)    Mitral valve prolapse    Personal history of malignant neoplasm of breast    Thyroid disease     Past Surgical History:  Procedure Laterality Date   ABDOMINAL HYSTERECTOMY  2010   fibroids, h/o abnormal pap smears   AXILLARY SENTINEL NODE BIOPSY Right 07/08/2020   Procedure: RIGHT AXILLARY SENTINEL NODE BIOPSY;  Surgeon: Rolm Bookbinder, MD;  Location: [place];  Service: General;  Laterality: Right;   BREAST RECONSTRUCTION WITH PLACEMENT OF TISSUE EXPANDER AND FLEX HD (ACELLULAR HYDRATED DERMIS) Bilateral 07/08/2020   Procedure: BILATERAL BREAST RECONSTRUCTION WITH   FLEX HD (ACELLULAR HYDRATED DERMIS),DIRECT IMPLANT RECONSTRUCTION;  Surgeon: Cindra Presume, MD;  Location: [place];  Service: Plastics;  Laterality: Bilateral;   BREAST SURGERY Right 2015   right lumpectomy with reduction and lift   COLONOSCOPY     DENTAL SURGERY     ESOPHAGOGASTRODUODENOSCOPY     FOOT SURGERY     dislocated toe   LAPAROSCOPIC GASTRIC SLEEVE RESECTION  2014   LAPAROSCOPIC OOPHERECTOMY Bilateral 2016   REPLACEMENT TOTAL KNEE Left 2018   also had 3 prior arthroscopies   TOTAL MASTECTOMY Bilateral 07/08/2020   Procedure: BILATERAL TOTAL MASTECTOMY;  Surgeon: Rolm Bookbinder, MD;  Location: [place];  Service: General;  Laterality: Bilateral;   VENA CAVA FILTER PLACEMENT  2017    There were no vitals filed for this visit.   Subjective Assessment - 09/09/20 0809     Subjective I don't think the chip pack has helped much. I think it is just changing the shape but the pore size is still large.    Pertinent History 2014 Rt breast cancer with lumpectomy with SLNB, radiation, and chemotherapy. Recurrence with bil mastectomy with SLNB on 07/08/20 due to grade 2 ILC ER/PR positive.  Other history includes clotting disorder and DVT history.  Patient Stated Goals get information from providers    Currently in Pain? No/denies   tightness in R axilla with UE raised   Pain Score 0-No pain                               [facility] Adult PT Treatment/Exercise - 09/09/20 0001       Manual Therapy   Manual Therapy Manual Lymphatic Drainage (MLD)    Manual Lymphatic Drainage (MLD) began instructing pt in basic principles of MLD as follows in supine: short neck, 5 diaphragmatic breaths, right inguinal nodes and establishment of axillo inguinal pathway, R breast with focus on area of swelling inferior to mastectomy scar moving fluid towards pathways then retracing all steps                         PT Long Term Goals - 09/09/20 7001       PT LONG TERM GOAL  #1   Title Pt will return to close to baseline shoulder AROM to demonstrate no additional need for PT    Time 12    Period Weeks    Status Achieved      PT LONG TERM GOAL #2   Title Pt will be educated on lymphedema risk reduction and sleeve use    Time 12    Period Weeks    Status On-going      PT LONG TERM GOAL #3   Title Pt will be scheduled for SOZO surveillance    Time 12    Period Weeks    Status Achieved      PT LONG TERM GOAL #4   Title Pt will be independent in Strength ABC program for long term strengthening and stretching    Time 4    Period Weeks    Status Achieved      PT LONG TERM GOAL #5   Title Pt will demonstrate a decrease in edema in R breast as evidenced by decreased pore size.    Time 4    Period Weeks    Status New    Target Date 10/07/20      Additional Long Term Goals   Additional Long Term Goals Yes      PT LONG TERM GOAL #6   Title Pt will be independent in self MLD for long term management of edema.    Time 4    Period Weeks    Status New    Target Date 10/07/20                   Plan - 09/09/20 0825     Clinical Impression Statement Assessed pt's progress towards goals in therapy. Pt now feels independent in Strength ABC program. Educated pt in lymphedema risk reduction practices and issued a script for a prophylactic sleeve and gauntlet since pt is taking a longer flight in the next few weeks. Also educated pt that she has a few small areas along her mastectomy scar that are pink that were not pink previously which pt has also noted. Educated pt to contact her doctor regarding this and to hold off on massage until the pink clears up or she sees her doctor. Pt would benefit from continued skilled PT services to address inferior breast swelling (began instructing pt in this today) and decrease right axillary tightness.    Stability/Clinical Decision Making Stable/Uncomplicated    Clinical  Decision Making Low    PT Frequency 1x / week     PT Duration 4 weeks    PT Treatment/Interventions ADLs/Self Care Home Management;Therapeutic exercise;Patient/family education;Manual techniques;Manual lymph drainage;Compression bandaging;Scar mobilization;Vasopneumatic Device    PT Next Visit Plan myofascial release to R axilla to decrease tightness, instruct pt in self MLD for R breast and issue handout    PT Home Exercise Plan post op, supine dowel exercises, Strength ABC    Consulted and Agree with Plan of Care Patient             Patient will benefit from skilled therapeutic intervention in order to improve the following deficits and impairments:  Decreased knowledge of precautions, Postural dysfunction, Decreased strength, Increased edema  Visit Diagnosis: Postmastectomy lymphedema  Disorder of the skin and subcutaneous tissue related to radiation, unspecified  Muscle weakness (generalized)  Abnormal posture  History of malignant neoplasm of right breast     Problem List Patient Active Problem List   Diagnosis Date Noted   Breast cancer ([place]) 07/08/2020   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   H/O total hysterectomy 06/11/2020   Genetic testing 05/27/2020   Family history of breast cancer    Family history of colon cancer    Personal history of malignant neoplasm of breast    Unilateral primary osteoarthritis, right knee 02/26/2020   Unilateral primary osteoarthritis, 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   DVT (deep venous thrombosis) ([place]) 05/04/2018   Malignant neoplasm of upper-outer quadrant of right breast in female, estrogen receptor negative ([place]) 05/03/2018    Allyson Sabal [facility] 09/09/2020, 9:04 AM  [place], [place], [address] Phone: [phone]   Fax:  [phone]  Name: Sierra Perez MRN: [id number] Date of Birth: [birth year]   Manus Gunning, PT 09/09/20 9:04 AM

## 2020-09-09 NOTE — Telephone Encounter (Signed)
Patient called to say she was at PT and there is pinkness around the scar going up and out around it. She is concerned it may be infected. She was advised to be seen before her appt on the 18th. Please call to advise.

## 2020-09-09 NOTE — Telephone Encounter (Signed)
Pt reported that she noticed redness at surgical site over the weekend and her PT noticed it as well. She reported no fever, chills, n & v, no pain, no oozing of pus or blood or swelling. Pt stated that her PT believes she should be seen before her appt on 8/18. Pt reports that the redness has worsened since the weekend and has grown larger. She also stated that the last time she experienced this same Sx, Dr. Claudia Desanctis put her on an Abx. Adv pt that I would send a message to Maudie Mercury to get her appt rescheduled. She conveyed understanding.

## 2020-09-10 ENCOUNTER — Encounter: Payer: Self-pay | Admitting: Surgical

## 2020-09-10 ENCOUNTER — Ambulatory Visit (INDEPENDENT_AMBULATORY_CARE_PROVIDER_SITE_OTHER): Payer: 59 | Admitting: Surgical

## 2020-09-10 DIAGNOSIS — C50411 Malignant neoplasm of upper-outer quadrant of right female breast: Secondary | ICD-10-CM

## 2020-09-10 DIAGNOSIS — Z9889 Other specified postprocedural states: Secondary | ICD-10-CM

## 2020-09-10 DIAGNOSIS — Z171 Estrogen receptor negative status [ER-]: Secondary | ICD-10-CM

## 2020-09-10 NOTE — Progress Notes (Signed)
Patient is a 60 year old female here for follow-up after bilateral breast reconstruction with direct placement of implants with Dr. Claudia Desanctis on 07/08/2020.  She is here today for concerns of redness around the incision site.  She reports that she saw physical therapy yesterday and they were concerned about redness of her breasts and it was recommended she come in today for evaluation.  She reports that she has been working with physical therapy pretty extensively for assistance with improving strength and assistance with improvement in swelling.  She reports overall she feels as if things are going well.  She does have some anxiety about how things are healing and some stress related to the healing process.  She has some questions about her restrictions.  Of note she does have a history of radiation to the right breast.  Chaperone present on exam On exam left and right breast incisions are intact and well-healed.  I do not see any signs of concern for erythema or cellulitic changes.  The left breast is soft and no scar contracture or fat necrosis noted with palpation.  The right breast has some scar contracture on the inferior flap as well as some fat necrosis but I do not see any signs of concern.  There is also some skin changes suggestive of history of radiation.  I do not feel any subcutaneous fluid collections with palpation.  Discussed with patient that overall on exam everything appears reassuring, I did discuss with her that she does have some scar contracture, fat necrosis and radiation changes to the right breast as well as some possible postoperative swelling.  I discussed with patient that I do not notice any fluid collections.  I recommend light massage to the area of scar contracture, fat necrosis.  I discussed with patient that over time this will improve with massage.  She has been also receiving assistance with swelling and massage with physical therapy.  They are also working on strengthening.    Discussed with patient that she has no restrictions in relation to her bilateral breast reconstruction with direct placement of implants.  She is 2 months postop and therefore can slowly increase to normal activity as able.  I discussed with patient I do not see any signs of infection on exam.  Recommend calling with questions or concerns.  Recommend following up in 1 to 2 months for reevaluation.

## 2020-09-17 ENCOUNTER — Encounter: Payer: Self-pay | Admitting: Physical Therapy

## 2020-09-17 ENCOUNTER — Ambulatory Visit: Payer: 59 | Admitting: Physical Therapy

## 2020-09-17 ENCOUNTER — Other Ambulatory Visit: Payer: Self-pay

## 2020-09-17 DIAGNOSIS — I972 Postmastectomy lymphedema syndrome: Secondary | ICD-10-CM | POA: Diagnosis not present

## 2020-09-17 DIAGNOSIS — R293 Abnormal posture: Secondary | ICD-10-CM

## 2020-09-17 DIAGNOSIS — L599 Disorder of the skin and subcutaneous tissue related to radiation, unspecified: Secondary | ICD-10-CM

## 2020-09-17 DIAGNOSIS — Z853 Personal history of malignant neoplasm of breast: Secondary | ICD-10-CM

## 2020-09-17 DIAGNOSIS — M6281 Muscle weakness (generalized): Secondary | ICD-10-CM

## 2020-09-17 NOTE — Therapy (Signed)
[place], [place], [address] Phone: [phone]   Fax:  [phone]  Physical Therapy Treatment  Patient Details  Name: Sierra Perez MRN: [id number] Date of Birth: 17-Aug-1960 Referring Provider (PT): Dr. 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, her-2 positive, ER/PR negative   Clotting disorder ([place])    testing was negative, h/o DVT while on treatment   Complex regional pain syndrome i of right lower limb    RDS   DVT (deep venous thrombosis) ([place])    started in 2015, multiple   Family history of breast cancer    Family history of colon cancer    Fibroid    Heart murmur    HPV in female    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   Hypothyroidism    IBS (irritable bowel syndrome)    Mitral valve prolapse    Personal history of malignant neoplasm of breast    Thyroid disease     Past Surgical History:  Procedure Laterality Date   ABDOMINAL HYSTERECTOMY  2010   fibroids, h/o abnormal pap smears   AXILLARY SENTINEL NODE BIOPSY Right 07/08/2020   Procedure: RIGHT AXILLARY SENTINEL NODE BIOPSY;  Surgeon: Rolm Bookbinder, MD;  Location: [place];  Service: General;  Laterality: Right;   BREAST RECONSTRUCTION WITH PLACEMENT OF TISSUE EXPANDER AND FLEX HD (ACELLULAR HYDRATED DERMIS) Bilateral 07/08/2020   Procedure: BILATERAL BREAST RECONSTRUCTION WITH  FLEX HD (ACELLULAR HYDRATED  DERMIS),DIRECT IMPLANT RECONSTRUCTION;  Surgeon: Cindra Presume, MD;  Location: [place];  Service: Plastics;  Laterality: Bilateral;   BREAST SURGERY Right 2015   right lumpectomy with reduction and lift   COLONOSCOPY     DENTAL SURGERY     ESOPHAGOGASTRODUODENOSCOPY     FOOT SURGERY     dislocated toe   LAPAROSCOPIC GASTRIC SLEEVE RESECTION  2014   LAPAROSCOPIC OOPHERECTOMY Bilateral 2016   REPLACEMENT TOTAL KNEE Left 2018   also had 3 prior arthroscopies   TOTAL MASTECTOMY Bilateral 07/08/2020   Procedure: BILATERAL TOTAL MASTECTOMY;  Surgeon: Rolm Bookbinder, MD;  Location: [place];  Service: General;  Laterality: Bilateral;   VENA CAVA FILTER PLACEMENT  2017    There were no vitals filed for this visit.   Subjective Assessment - 09/17/20 0808     Subjective I can't tell any difference with wearing the chip pack. It just seems to change the whole shape of the breast.    Pertinent History 2014 Rt breast cancer with lumpectomy with SLNB, radiation, and chemotherapy. Recurrence with bil mastectomy with SLNB on 07/08/20 due to grade 2 ILC ER/PR positive.  Other history includes clotting disorder and DVT history.    Patient Stated Goals get information from providers  Currently in Pain? No/denies    Pain Score 0-No pain                               [facility] Adult PT Treatment/Exercise - 09/17/20 0001       Exercises   Other Exercises  Instructed pt in doorway stretch and corner stretch to help decrease pec tightness and had pt return demonstrate      Manual Therapy   Manual Therapy Manual Lymphatic Drainage (MLD);Myofascial release    Edema Management cut a piece of large dot peach foam for pt to wear against area of swelling with 1/2 grey foam behind it and also small dot peach foam with grey foam behind it - pt can choose which peach foam works best for her and can use this in place of chip pack since chip pack changes shape of implant when she wears it all day     Myofascial Release to R axilla to help decrease tightness    Manual Lymphatic Drainage (MLD) continued instructing pt in basic principles of MLD as follows in supine: short neck, 5 diaphragmatic breaths, right inguinal nodes and establishment of axillo inguinal pathway, R breast with focus on area of swelling inferior to mastectomy scar moving fluid towards pathways then retracing all steps                    PT Education - 09/17/20 1003     Education Details peau d orange skin, lymphedema, risk of lymphedema, lymphedema vs post op swelling, pec stretches    Person(s) Educated Patient    Methods Explanation    Comprehension Verbalized understanding                 PT Long Term Goals - 09/09/20 0211       PT LONG TERM GOAL #1   Title Pt will return to close to baseline shoulder AROM to demonstrate no additional need for PT    Time 12    Period Weeks    Status Achieved      PT LONG TERM GOAL #2   Title Pt will be educated on lymphedema risk reduction and sleeve use    Time 12    Period Weeks    Status On-going      PT LONG TERM GOAL #3   Title Pt will be scheduled for SOZO surveillance    Time 12    Period Weeks    Status Achieved      PT LONG TERM GOAL #4   Title Pt will be independent in Strength ABC program for long term strengthening and stretching    Time 4    Period Weeks    Status Achieved      PT LONG TERM GOAL #5   Title Pt will demonstrate a decrease in edema in R breast as evidenced by decreased pore size.    Time 4    Period Weeks    Status New    Target Date 10/07/20      Additional Long Term Goals   Additional Long Term Goals Yes      PT LONG TERM GOAL #6   Title Pt will be independent in self MLD for long term management of edema.    Time 4    Period Weeks    Status New    Target Date 10/07/20  Plan - 09/17/20 1003     Clinical Impression Statement Spent time answering pt's questions about her  edema in her right breast. Educated pt that this is most likely post op edema that is trapped between her scars and that the peau d orange skin texture is indicitive of edema. Only time will tell if she has lymphedema as post op edema will resolve will time. Continued with MLD to help decrease skin fold in inferior breast while instructing pt in technique. Groove softened some with MLD today. Cut large dot peach foam and 1/2 in grey foam for pt to wear in her bra to decrease edema. Pt's PA was not concerned with pink area surrounding her scar.    PT Frequency 1x / week    PT Duration 4 weeks    PT Treatment/Interventions ADLs/Self Care Home Management;Therapeutic exercise;Patient/family education;Manual techniques;Manual lymph drainage;Compression bandaging;Scar mobilization;Vasopneumatic Device    PT Next Visit Plan myofascial release to R axilla to decrease tightness, instruct pt in self MLD for R breast and issue handout    PT Home Exercise Plan post op, supine dowel exercises, Strength ABC    Consulted and Agree with Plan of Care Patient             Patient will benefit from skilled therapeutic intervention in order to improve the following deficits and impairments:  Decreased knowledge of precautions, Postural dysfunction, Decreased strength, Increased edema  Visit Diagnosis: Postmastectomy lymphedema  Disorder of the skin and subcutaneous tissue related to radiation, unspecified  Muscle weakness (generalized)  Abnormal posture  History of malignant neoplasm of right breast     Problem List Patient Active Problem List   Diagnosis Date Noted   Breast cancer ([place]) 07/08/2020   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   H/O total hysterectomy 06/11/2020   Genetic testing 05/27/2020   Family history of breast cancer    Family history of colon cancer    Personal history of malignant neoplasm of breast    Unilateral primary osteoarthritis, right knee 02/26/2020   Unilateral primary  osteoarthritis, 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   DVT (deep venous thrombosis) ([place]) 05/04/2018   Malignant neoplasm of upper-outer quadrant of right breast in female, estrogen receptor negative ([place]) 05/03/2018    Allyson Sabal [facility] 09/17/2020, 10:06 AM  [place], [place], [address] Phone: [phone]   Fax:  [phone]  Name: Meriel Kelliher MRN: [id number] Date of Birth: 08/15/1960  Manus Gunning, PT 09/17/20 10:07 AM

## 2020-09-19 ENCOUNTER — Ambulatory Visit (INDEPENDENT_AMBULATORY_CARE_PROVIDER_SITE_OTHER): Payer: 59 | Admitting: Plastic Surgery

## 2020-09-19 ENCOUNTER — Other Ambulatory Visit: Payer: Self-pay

## 2020-09-19 DIAGNOSIS — Z171 Estrogen receptor negative status [ER-]: Secondary | ICD-10-CM

## 2020-09-19 DIAGNOSIS — Z719 Counseling, unspecified: Secondary | ICD-10-CM

## 2020-09-19 DIAGNOSIS — C50411 Malignant neoplasm of upper-outer quadrant of right female breast: Secondary | ICD-10-CM

## 2020-09-19 NOTE — Progress Notes (Signed)
Patient presents little over 2 months postop from immediate direct implant breast reconstruction.  She has some concerns regarding the skin on her right breast and wants to follow-up on that.  On exam there is some firmness to the inferior and lateral skin on the right side that has been present essentially since her operation.  There is some grooving to the skin and folding and some dimpling which all looks to be consistent with history of radiation in my opinion.  There is a very faint discoloration superior to the incision which also was present initially postop which has faded quite a bit and does not look to be concerning at this point.  No concerning redness or spreading cellulitis.  No obvious subcutaneous fluid collections.  She may have some edema in the tissues themselves I am hopeful that over time this will relax to a certain degree.  I do believe that her history of radiation is contributed significantly to the firmness of these tissues and explains their appearance.  She is continuing to work with physical therapy and is planning at least 1 more visit with them.  We will plan to see her again in a couple months to evaluate timing for nipple areolar complex tattoos.  All of her questions were answered.

## 2020-09-23 ENCOUNTER — Ambulatory Visit (INDEPENDENT_AMBULATORY_CARE_PROVIDER_SITE_OTHER): Payer: 59 | Admitting: Orthopaedic Surgery

## 2020-09-23 ENCOUNTER — Encounter: Payer: Self-pay | Admitting: Orthopaedic Surgery

## 2020-09-23 ENCOUNTER — Telehealth: Payer: Self-pay | Admitting: Orthopaedic Surgery

## 2020-09-23 VITALS — Ht 68.0 in | Wt 237.0 lb

## 2020-09-23 DIAGNOSIS — M1611 Unilateral primary osteoarthritis, right hip: Secondary | ICD-10-CM

## 2020-09-23 DIAGNOSIS — M1711 Unilateral primary osteoarthritis, right knee: Secondary | ICD-10-CM | POA: Diagnosis not present

## 2020-09-23 NOTE — Progress Notes (Signed)
Office Visit Note   Patient: Sierra Perez           Date of Birth: [birth year]           MRN: [id number] Visit Date: 09/23/2020              Requested by: Wenda Low, MD [place] [employer] [address],  [place] [address] PCP: Wenda Low, MD   Assessment & Plan: Visit Diagnoses:  1. Unilateral primary osteoarthritis, right knee   2. Unilateral primary osteoarthritis, right hip     Plan: She will find out from her hematologist about bridging with Lovenox when coming off of Eliquis for her surgery October 18.  I gave her handout about hip replacement surgery and we talked about the surgery in detail including the risks and benefits and what to expect from intraoperative and postoperative course as well as therapy.  All questions and concerns were answered and addressed.  She would need to be out of work anywhere from 4 to 12 weeks depending on her recovery.  All question concerns were answered and addressed.   Follow-Up Instructions: Return for 2 weeks post-op.   Orders:  No orders of the defined types were placed in this encounter.  No orders of the defined types were placed in this encounter.     Procedures: No procedures performed   Clinical Data: No additional findings.   Subjective: No chief complaint on file. The patient comes in today to go over details as a relates to right total hip arthroplasty surgery.  She has well-documented severe osteoarthritis in her right hip.  We were going to replace her right knee we will refill is more appropriate to address the hip first.  She has had issues with RSD in the past and has a remote history of a left total knee arthroplasty that had a difficult recovery and required a manipulation under anesthesia.  She has been dealing with recovery from breast mastectomy secondary to cancer.  Her right hip is been bothering her quite a bit and she feels this would be a more prudent surgery at this point from a pain standpoint and I  agree with this wholeheartedly.  Her right hip pain is definitely affecting her mobility, her quality of life and actives daily living.  We had a long and thorough discussion about this today.  She has tried and failed all forms conservative treatment now for over a year.  She is on Eliquis.  She will talk her to her hematologist about whether or not she needs to be bridged with Lovenox with coming off Eliquis for surgery.  She does have a history of blood clots.  She would need to stop Eliquis at least 3 to 4 days before surgery.  HPI  Review of Systems   Objective: Vital Signs: Ht 5' 8"  (1.727 m)   Wt 237 lb (107.5 kg)   BMI 36.04 kg/m   Physical Exam She is alert and orient x3 and in no acute distress Ortho Exam Her right hip shows pain with internal and external rotation and stiffness. Specialty Comments:  No specialty comments available.  Imaging: No results found. The previous MRI from last year right hip shows moderate to severe osteoarthritis of the right hip.  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   DVT (deep venous thrombosis) ([place]) 05/04/2018   Malignant neoplasm of upper-outer quadrant of right breast in female, estrogen receptor negative ([place]) 05/03/2018   Past Medical History:  Diagnosis Date   Anxiety    Asthma    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, her-2 positive, ER/PR negative   Clotting disorder ([facility])    testing was negative, h/o DVT while on treatment   Complex regional pain syndrome i of right lower limb    RDS   DVT  (deep venous thrombosis) ([place])    started in 2015, multiple   Family history of breast cancer    Family history of colon cancer    Fibroid    Heart murmur    HPV in female    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   Hypothyroidism    IBS (irritable bowel syndrome)    Mitral valve prolapse    Personal history of malignant neoplasm of breast    Thyroid disease     Family History  Problem Relation Age of Onset   Hypertension Mother    Skin cancer Mother    Diabetes Father    Kidney disease Father    Dementia Father    Factor VIII deficiency Father    Bladder Cancer Maternal Grandmother        dx in late 92s; smoker   Stroke Maternal Grandmother    Lung cancer Maternal Grandfather    Colon cancer Maternal Grandfather 72   Thyroid cancer Maternal Grandfather        dx in his 36s   Breast cancer Paternal Grandmother        dx in 91s   Prostate cancer Other        MGMs brother    Past Surgical History:  Procedure Laterality Date   ABDOMINAL HYSTERECTOMY  2010   fibroids, h/o abnormal pap smears   AXILLARY SENTINEL NODE BIOPSY Right 07/08/2020   Procedure: RIGHT AXILLARY SENTINEL NODE BIOPSY;  Surgeon: Rolm Bookbinder, MD;  Location: [place];  Service: General;  Laterality: Right;   BREAST RECONSTRUCTION WITH PLACEMENT OF TISSUE EXPANDER AND FLEX HD (ACELLULAR HYDRATED DERMIS) Bilateral 07/08/2020   Procedure: BILATERAL BREAST RECONSTRUCTION WITH  FLEX HD (ACELLULAR HYDRATED DERMIS),DIRECT IMPLANT RECONSTRUCTION;  Surgeon: Cindra Presume, MD;  [facility];  Service: Plastics;  Laterality: Bilateral;   BREAST SURGERY Right 2015   right lumpectomy with reduction and lift   COLONOSCOPY     DENTAL SURGERY     ESOPHAGOGASTRODUODENOSCOPY     FOOT SURGERY     dislocated toe   LAPAROSCOPIC GASTRIC SLEEVE RESECTION  2014   LAPAROSCOPIC OOPHERECTOMY Bilateral 2016   REPLACEMENT TOTAL KNEE Left 2018   also had 3 prior arthroscopies   TOTAL MASTECTOMY Bilateral  07/08/2020   Procedure: BILATERAL TOTAL MASTECTOMY;  Surgeon: Rolm Bookbinder, MD;  Location: [place];  Service: General;  Laterality: Bilateral;   VENA CAVA FILTER PLACEMENT  2017   Social History   Occupational History   Not on file  Tobacco Use   Smoking status: Never   Smokeless tobacco: Never  Vaping Use   Vaping Use: Never used  Substance and Sexual Activity   Alcohol use:  Yes    Comment: occ   Drug use: Not Currently    Types: Marijuana   Sexual activity: Not Currently    Birth control/protection: Surgical    Comment: Hysterectomy

## 2020-09-23 NOTE — Telephone Encounter (Signed)
Received medical records release form,$50.00 cash and disability/fmla    Forwarding to [employer] today

## 2020-09-24 ENCOUNTER — Ambulatory Visit: Payer: 59 | Admitting: Physical Therapy

## 2020-09-24 ENCOUNTER — Encounter: Payer: Self-pay | Admitting: Physical Therapy

## 2020-09-24 ENCOUNTER — Other Ambulatory Visit: Payer: Self-pay

## 2020-09-24 DIAGNOSIS — R293 Abnormal posture: Secondary | ICD-10-CM

## 2020-09-24 DIAGNOSIS — I972 Postmastectomy lymphedema syndrome: Secondary | ICD-10-CM | POA: Diagnosis not present

## 2020-09-24 DIAGNOSIS — M6281 Muscle weakness (generalized): Secondary | ICD-10-CM

## 2020-09-24 DIAGNOSIS — L599 Disorder of the skin and subcutaneous tissue related to radiation, unspecified: Secondary | ICD-10-CM

## 2020-09-24 DIAGNOSIS — Z853 Personal history of malignant neoplasm of breast: Secondary | ICD-10-CM

## 2020-09-24 NOTE — Therapy (Signed)
[place], [place], [address] Phone: [phone]   Fax:  [phone]  Physical Therapy Treatment  Patient Details  Name: Sierra Perez MRN: [id number] Date of Birth: 02/17/1960 Referring Provider (PT): Dr. 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, her-2 positive, ER/PR negative   Clotting disorder ([place])    testing was negative, h/o DVT while on treatment   Complex regional pain syndrome i of right lower limb    RDS   DVT (deep venous thrombosis) ([place])    started in 2015, multiple   Family history of breast cancer    Family history of colon cancer    Fibroid    Heart murmur    HPV in female    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   Hypothyroidism    IBS (irritable bowel syndrome)    Mitral valve prolapse    Personal history of malignant neoplasm of breast    Thyroid disease     Past Surgical History:  Procedure Laterality Date   ABDOMINAL HYSTERECTOMY  2010   fibroids, h/o abnormal pap smears   AXILLARY SENTINEL NODE BIOPSY Right 07/08/2020   Procedure: RIGHT AXILLARY SENTINEL NODE BIOPSY;  Surgeon: Rolm Bookbinder, MD;  Location: [place];  Service: General;  Laterality: Right;   BREAST RECONSTRUCTION WITH PLACEMENT OF TISSUE EXPANDER AND FLEX HD (ACELLULAR HYDRATED DERMIS) Bilateral 07/08/2020   Procedure: BILATERAL BREAST RECONSTRUCTION WITH  FLEX HD (ACELLULAR HYDRATED  DERMIS),DIRECT IMPLANT RECONSTRUCTION;  Surgeon: Cindra Presume, MD;  Location: [facility];  Service: Plastics;  Laterality: Bilateral;   BREAST SURGERY Right 2015   right lumpectomy with reduction and lift   COLONOSCOPY     DENTAL SURGERY     ESOPHAGOGASTRODUODENOSCOPY     FOOT SURGERY     dislocated toe   LAPAROSCOPIC GASTRIC SLEEVE RESECTION  2014   LAPAROSCOPIC OOPHERECTOMY Bilateral 2016   REPLACEMENT TOTAL KNEE Left 2018   also had 3 prior arthroscopies   TOTAL MASTECTOMY Bilateral 07/08/2020   Procedure: BILATERAL TOTAL MASTECTOMY;  Surgeon: Rolm Bookbinder, MD;  Location: [facility];  Service: General;  Laterality: Bilateral;   VENA CAVA FILTER PLACEMENT  2017    There were no vitals filed for this visit.   Subjective Assessment - 09/24/20 0810     Subjective I used the flatter of the peach foam and it did not change the shape of the breast but it did leave some little indentions. It did not really seem to help. The doctor said it was all radiation related and I needed to give it more time.    Pertinent History 2014 Rt breast cancer with lumpectomy with SLNB, radiation, and chemotherapy. Recurrence with bil mastectomy with SLNB on 07/08/20 due  to grade 2 ILC ER/PR positive.  Other history includes clotting disorder and DVT history.    Patient Stated Goals get information from providers    Currently in Pain? Yes    Pain Score 2     Pain Location --   trunk   Pain Orientation Right;Lateral    Pain Descriptors / Indicators Dull    Pain Type Acute pain    Pain Onset In the past 7 days    Pain Frequency Intermittent    Aggravating Factors  certain positions    Pain Relieving Factors nothing    Effect of Pain on Daily Activities no effect                               [facility] Adult PT Treatment/Exercise - 09/24/20 0001       Manual Therapy   Manual Lymphatic Drainage (MLD) continued instructing pt in basic principles of MLD, issued handout and had pt return  demonstrate the folloiwng:in supine: short neck, 5 diaphragmatic breaths, right inguinal nodes and establishment of axillo inguinal pathway, R breast with focus on area of swelling inferior to mastectomy scar moving fluid towards pathways then retracing all steps. Pore size diminshed in lateral area of right breast with improved skin mobility at end of session.                    PT Education - 09/24/20 0815     Education Details self MLD for R breast    Person(s) Educated Patient    Methods Explanation;Handout;Tactile cues;Verbal cues    Comprehension Verbalized understanding;Returned demonstration;Verbal cues required                 PT Long Term Goals - 09/09/20 1829       PT LONG TERM GOAL #1   Title Pt will return to close to baseline shoulder AROM to demonstrate no additional need for PT    Time 12    Period Weeks    Status Achieved      PT LONG TERM GOAL #2   Title Pt will be educated on lymphedema risk reduction and sleeve use    Time 12    Period Weeks    Status On-going      PT LONG TERM GOAL #3   Title Pt will be scheduled for SOZO surveillance    Time 12    Period Weeks    Status Achieved      PT LONG TERM GOAL #4   Title Pt will be independent in Strength ABC program for long term strengthening and stretching    Time 4    Period Weeks    Status Achieved      PT LONG TERM GOAL #5   Title Pt will demonstrate a decrease in edema in R breast as evidenced by decreased pore size.    Time 4    Period Weeks    Status New    Target Date 10/07/20      Additional Long Term Goals   Additional Long Term Goals Yes      PT LONG TERM GOAL #6   Title Pt will be independent in self MLD for long term management of edema.    Time 4    Period Weeks    Status New    Target Date 10/07/20  Plan - 09/24/20 0905     Clinical Impression Statement Instructed pt today on self MLD and issued handout. Had pt return demonstrate  entire sequence and correct skin stretch. Educated pt to try self MLD at home and to come to next session with any questions she may have. Pore size diminished towards lateral breast following MLD today with improved skin mobility.    PT Frequency 1x / week    PT Duration 4 weeks    PT Treatment/Interventions ADLs/Self Care Home Management;Therapeutic exercise;Patient/family education;Manual techniques;Manual lymph drainage;Compression bandaging;Scar mobilization;Vasopneumatic Device    PT Next Visit Plan myofascial release to R axilla to decrease tightness, instruct pt in self MLD for R breast and issue handout    PT Home Exercise Plan post op, supine dowel exercises, Strength ABC, self MLD    Consulted and Agree with Plan of Care Patient             Patient will benefit from skilled therapeutic intervention in order to improve the following deficits and impairments:  Decreased knowledge of precautions, Postural dysfunction, Decreased strength, Increased edema  Visit Diagnosis: Postmastectomy lymphedema  Disorder of the skin and subcutaneous tissue related to radiation, unspecified  Muscle weakness (generalized)  Abnormal posture  History of malignant neoplasm of right breast     Problem List Patient Active Problem List   Diagnosis Date Noted   Breast cancer ([place]) 07/08/2020   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   H/O total hysterectomy 06/11/2020   Genetic testing 05/27/2020   Family history of breast cancer    Family history of colon cancer    Personal history of malignant neoplasm of breast    Unilateral primary osteoarthritis, right knee 02/26/2020   Unilateral primary osteoarthritis, 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

## 2020-09-24 NOTE — Patient Instructions (Signed)
Self manual lymph drainage: Perform this sequence once a day.  Only give enough pressure no your skin to make the skin move.  Diaphragmatic - Supine   Inhale through nose making navel move out toward hands. Exhale through puckered lips, hands follow navel in. Repeat _5__ times. Rest _10__ seconds between repeats.   Copyright  VHI. All rights reserved.  Hug yourself.  Do circles at your neck just above your collarbones.  Repeat this 10 times.  Axilla - One at a Time   Using full weight of flat hand and fingers at center of uninvolved armpit, make _10__ in-place circles.   Copyright  VHI. All rights reserved.  LEG: Inguinal Nodes Stimulation   With small finger side of hand against hip crease on involved side, gently perform circles at the crease. Repeat __10_ times.   Copyright  VHI. All rights reserved.  Axilla to Inguinal Nodes - Sweep   On involved side, sweep (stretch skin) _4__ times from armpit along side of trunk to hip crease.  Now gently stretch skin from the involved side to the uninvolved side across the chest at the shoulder line.  Repeat that 4 times.  Draw an imaginary diagonal line from upper outer breast through the nipple area toward lower inner breast.  Direct fluid upward and inward from this line toward the pathway across your upper chest .  Do this in three rows to treat all of the upper inner breast tissue, and do each row 3-4x.      Direct fluid to treat all of lower outer breast tissue downward and outward toward      pathway that is aimed at the right groin.  Finish by doing the pathways as described above going from your involved armpit to the same side groin and going across your upper chest from the involved shoulder to the uninvolved shoulder.  Repeat the steps above where you do circles in your right groin and left armpit. Copyright  VHI. All rights reserved.   

## 2020-09-30 ENCOUNTER — Other Ambulatory Visit: Payer: Self-pay | Admitting: Hematology and Oncology

## 2020-10-01 ENCOUNTER — Other Ambulatory Visit: Payer: Self-pay

## 2020-10-01 ENCOUNTER — Encounter: Payer: Self-pay | Admitting: Physical Therapy

## 2020-10-01 ENCOUNTER — Ambulatory Visit: Payer: 59 | Admitting: Physical Therapy

## 2020-10-01 DIAGNOSIS — I972 Postmastectomy lymphedema syndrome: Secondary | ICD-10-CM

## 2020-10-01 DIAGNOSIS — Z853 Personal history of malignant neoplasm of breast: Secondary | ICD-10-CM

## 2020-10-01 DIAGNOSIS — R293 Abnormal posture: Secondary | ICD-10-CM

## 2020-10-01 DIAGNOSIS — M6281 Muscle weakness (generalized): Secondary | ICD-10-CM

## 2020-10-01 DIAGNOSIS — L599 Disorder of the skin and subcutaneous tissue related to radiation, unspecified: Secondary | ICD-10-CM

## 2020-10-01 NOTE — Therapy (Addendum)
[place], [place], [address] Phone: [phone]   Fax:  [phone]  Physical Therapy Treatment  Patient Details  Name: Sierra Perez MRN: [id number] Date of Birth: Aug 06, 1960 Referring Provider (PT): Dr. 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, her-2 positive, ER/PR negative   Clotting disorder ([place])    testing was negative, h/o DVT while on treatment   Complex regional pain syndrome i of right lower limb    RDS   DVT (deep venous thrombosis) ([place])    started in 2015, multiple   Family history of breast cancer    Family history of colon cancer    Fibroid    Heart murmur    HPV in female    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   Hypothyroidism    IBS (irritable bowel syndrome)    Mitral valve prolapse    Personal history of malignant neoplasm of breast    Thyroid disease     Past Surgical History:  Procedure Laterality Date   ABDOMINAL HYSTERECTOMY  2010   fibroids, h/o abnormal pap smears   AXILLARY SENTINEL NODE BIOPSY Right 07/08/2020   Procedure: RIGHT AXILLARY SENTINEL NODE BIOPSY;  Surgeon: Rolm Bookbinder, MD;  Location: [place];  Service: General;  Laterality: Right;   BREAST RECONSTRUCTION WITH PLACEMENT OF TISSUE EXPANDER AND FLEX HD (ACELLULAR HYDRATED DERMIS) Bilateral 07/08/2020   Procedure: BILATERAL BREAST  RECONSTRUCTION WITH  FLEX HD (ACELLULAR HYDRATED DERMIS),DIRECT IMPLANT RECONSTRUCTION;  Surgeon: Cindra Presume, MD;  Location: [place];  Service: Plastics;  Laterality: Bilateral;   BREAST SURGERY Right 2015   right lumpectomy with reduction and lift   COLONOSCOPY     DENTAL SURGERY     ESOPHAGOGASTRODUODENOSCOPY     FOOT SURGERY     dislocated toe   LAPAROSCOPIC GASTRIC SLEEVE RESECTION  2014   LAPAROSCOPIC OOPHERECTOMY Bilateral 2016   REPLACEMENT TOTAL KNEE Left 2018   also had 3 prior arthroscopies   TOTAL MASTECTOMY Bilateral 07/08/2020   Procedure: BILATERAL TOTAL MASTECTOMY;  Surgeon: Rolm Bookbinder, MD;  Location: [place];  Service: General;  Laterality: Bilateral;   VENA CAVA FILTER PLACEMENT  2017    There were no vitals filed for this visit.   Subjective Assessment - 10/01/20 0813     Subjective I was just diagnosed with diabetes. To me the swelling seems about the same. I don't really have any questions about the self MLD.    Pertinent History 2014 Rt breast cancer with lumpectomy with SLNB, radiation, and chemotherapy. Recurrence with bil mastectomy with SLNB on 07/08/20 due to grade 2 ILC ER/PR positive.  Other history includes clotting disorder and DVT  history, diabetes    Patient Stated Goals get information from providers    Currently in Pain? No/denies    Pain Score 0-No pain                    L-DEX FLOWSHEETS - 10/01/20 0800       L-DEX LYMPHEDEMA SCREENING   Measurement Type Unilateral    L-DEX MEASUREMENT EXTREMITY Upper Extremity    POSITION  Standing    DOMINANT SIDE Left    At Risk Side Right    BASELINE SCORE (UNILATERAL) 5.9    L-DEX SCORE (UNILATERAL) 4.5    VALUE CHANGE (UNILAT) -1.4                       [facility] Adult PT Treatment/Exercise - 10/01/20 0001       Manual Therapy   Myofascial Release to R axilla to help decrease tightness    Manual Lymphatic Drainage (MLD) in supine: short neck, 5 diaphragmatic breaths,  right inguinal nodes and establishment of axillo inguinal pathway, R breast with focus on area of swelling inferior to mastectomy scar moving fluid towards pathways then retracing all steps. Pore size diminshed in lateral area of right breast with improved skin mobility at end of session.                         PT Long Term Goals - 10/01/20 0858       PT LONG TERM GOAL #1   Title Pt will return to close to baseline shoulder AROM to demonstrate no additional need for PT    Time 12    Period Weeks    Status Achieved      PT LONG TERM GOAL #2   Title Pt will be educated on lymphedema risk reduction and sleeve use    Time 12    Period Weeks    Status Achieved      PT LONG TERM GOAL #3   Title Pt will be scheduled for SOZO surveillance    Time 12    Period Weeks    Status Achieved      PT LONG TERM GOAL #4   Title Pt will be independent in Strength ABC program for long term strengthening and stretching    Time 4    Period Weeks    Status Achieved      PT LONG TERM GOAL #5   Title Pt will demonstrate a decrease in edema in R breast as evidenced by decreased pore size.    Baseline 10/01/20- pore size is decreased especially at lateral aspect    Time 4    Period Weeks    Status Achieved      PT LONG TERM GOAL #6   Title Pt will be independent in self MLD for long term management of edema.    Time 4    Period Weeks    Status Achieved                   Plan - 10/01/20 0859     Clinical Impression Statement Remeasured SOZO today and pt had a decrease from baseline so there is no evidence of subclinical lymphedema. She has been wearing the foam in her bra and she demonstrates improved skin mobility and decreased pore size. Pt is now independent in self MLD and Strength ABC program. She obtained a compression sleeve and will wear it for her flight  on Sunday. Pt has now met all goals for therapy and will be discharged from skilled PT servcies at this time.     PT Frequency 1x / week    PT Duration 4 weeks    PT Treatment/Interventions ADLs/Self Care Home Management;Therapeutic exercise;Patient/family education;Manual techniques;Manual lymph drainage;Compression bandaging;Scar mobilization;Vasopneumatic Device    PT Next Visit Plan d/c this visit - continue SOZO every 3 months for the first 2 yrs after surgery    PT Home Exercise Plan post op, supine dowel exercises, Strength ABC, self MLD    Consulted and Agree with Plan of Care Patient             Patient will benefit from skilled therapeutic intervention in order to improve the following deficits and impairments:  Decreased knowledge of precautions, Postural dysfunction, Decreased strength, Increased edema  Visit Diagnosis: Postmastectomy lymphedema  Disorder of the skin and subcutaneous tissue related to radiation, unspecified  Muscle weakness (generalized)  Abnormal posture  History of malignant neoplasm of right breast     Problem List Patient Active Problem List   Diagnosis Date Noted   Breast cancer ([place]) 07/08/2020   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   H/O total hysterectomy 06/11/2020   Genetic testing 05/27/2020   Family history of breast cancer    Family history of colon cancer    Personal history of malignant neoplasm of breast    Unilateral primary osteoarthritis, right knee 02/26/2020   Unilateral primary osteoarthritis, 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   DVT (deep venous thrombosis) ([place]) 05/04/2018   Malignant neoplasm of upper-outer quadrant of right breast in female, estrogen receptor negative ([place]) 05/03/2018    Allyson Sabal [facility] 10/01/2020, 3:54 PM  [place], [place], [address] Phone: [phone]   Fax:  [phone]  Name: Sierra Perez MRN: [id number] Date of Birth: 03/27/1960  Manus Gunning, PT 10/01/20 3:54 PM  PHYSICAL THERAPY  DISCHARGE SUMMARY  Visits from Start of Care: 7  Current functional level related to goals / functional outcomes: All goals met   Remaining deficits: None   Education / Equipment: Self MLD, HEP, compression   Patient agrees to discharge. Patient goals were met. Patient is being discharged due to meeting the stated rehab goals.  Allyson Sabal [place], [place] 10/01/20 3:54 PM

## 2020-10-03 ENCOUNTER — Other Ambulatory Visit: Payer: Self-pay

## 2020-10-14 ENCOUNTER — Ambulatory Visit: Payer: 59

## 2020-10-29 ENCOUNTER — Encounter: Payer: 59 | Admitting: Orthopaedic Surgery

## 2020-10-30 ENCOUNTER — Encounter: Payer: Self-pay | Admitting: Orthopaedic Surgery

## 2020-11-07 ENCOUNTER — Other Ambulatory Visit: Payer: Self-pay | Admitting: *Deleted

## 2020-11-07 ENCOUNTER — Other Ambulatory Visit: Payer: Self-pay | Admitting: Hematology and Oncology

## 2020-11-07 ENCOUNTER — Encounter: Payer: Self-pay | Admitting: Hematology and Oncology

## 2020-11-07 DIAGNOSIS — I82599 Chronic embolism and thrombosis of other specified deep vein of unspecified lower extremity: Secondary | ICD-10-CM

## 2020-11-07 DIAGNOSIS — Z171 Estrogen receptor negative status [ER-]: Secondary | ICD-10-CM

## 2020-11-07 DIAGNOSIS — C50411 Malignant neoplasm of upper-outer quadrant of right female breast: Secondary | ICD-10-CM

## 2020-11-07 MED ORDER — ENOXAPARIN SODIUM 100 MG/ML IJ SOSY: 100.0000 mg | PREFILLED_SYRINGE | Freq: Two times a day (BID) | INTRAMUSCULAR | 0 refills | Status: DC

## 2020-11-07 NOTE — Progress Notes (Signed)
Pt undergoing Hip replacement 11/19/20.  Per MD pt to stop taking Eliquis on 11/14/20 and start self administering Lovenox 100 mg q12 hrs subcu x 4 days.  Pt to not take any anticoagulations day of surgery and pt to resume Eliquis day after surgery.  Pt also requesting referral be placed to Vascular Surgery due to pt hx of green filter placed 4 years ago by provider in [place]. Prescription sent to pharmacy on file and referral placed.  pt educated and verbalized understanding.

## 2020-11-11 ENCOUNTER — Other Ambulatory Visit: Payer: Self-pay | Admitting: Physician Assistant

## 2020-11-11 DIAGNOSIS — M1611 Unilateral primary osteoarthritis, right hip: Secondary | ICD-10-CM

## 2020-11-11 NOTE — Telephone Encounter (Signed)
I called patient and discussed.  She should not be tested at the hospital because of the positive 09/28 COVID home test because the hospital test will still show positive.  I also addressed other questions.  I called Gayle at [facility] PAT and will note on pre-op appt for Friday to not do COVID test.

## 2020-11-14 NOTE — Progress Notes (Addendum)
Surgical Instructions    Your procedure is scheduled on Tuesday, October 18th.  Report to [facility] Main Entrance "A" at 10:00 A.M., then check in with the Admitting office.  Call this number if you have problems the morning of surgery:  [phone]   If you have any questions prior to your surgery date call [phone]: Open Monday-Friday 8am-4pm    Remember:  Do not eat after midnight the night before your surgery  You may drink clear liquids until 9:00 AM the morning of your surgery.   Clear liquids allowed are: Water, Non-Citrus Juices (without pulp), Carbonated Beverages, Clear Tea, Black Coffee ONLY (NO MILK, CREAM OR POWDERED CREAMER of any kind), and Gatorade  Please complete your PRE-SURGERY G2 Gatorade that was provided to you by 9:00 AM the morning of surgery.  Please, if able, drink it in one setting. DO NOT SIP.     Take these medicines the morning of surgery with A SIP OF WATER   albuterol (VENTOLIN HFA) , Please bring all inhalers with you the day of surgery.  amLODipine (NORVASC)  DULoxetine (CYMBALTA) fluticasone (FLONASE) Ipratropium (ATROVENT) if needed pantoprazole (PROTONIX) valACYclovir (VALTREX) Gabapentin (Neurontin) Levothyroxine (Synthroid)  Follow instructions about [place] Bridge  Follow your surgeon's instructions on when to stop ELIQUIS.  If no instructions were given by your surgeon then you will need to call the office to get those instructions.   As of today, STOP taking any Aspirin (unless otherwise instructed by your surgeon) Aleve, Naproxen, Ibuprofen, Motrin, Advil, Goody's, BC's, all herbal medications, fish oil, and all vitamins.  WHAT DO I DO ABOUT MY DIABETES MEDICATION?  Do not take oral diabetes medicines (pills) the morning of surgery. - Metformin    DAY OF SURGERY:         Do not wear jewelry, makeup, or nail polish Do not wear lotions, powders, perfumes, or deodorant. Do not shave 48 hours prior to surgery.   Do not bring  valuables to the hospital.             [facility] is not responsible for any belongings or valuables.  Do NOT Smoke (Tobacco/Vaping)  24 hours prior to your procedure  If you use a CPAP at night, you may bring your mask for your overnight stay.   Contacts, glasses, hearing aids, dentures or partials may not be worn into surgery, please bring cases for these belongings   For patients admitted to the hospital, discharge time will be determined by your treatment team.   Patients discharged the day of surgery will not be allowed to drive home, and someone needs to stay with them for 24 hours.  NO VISITORS WILL BE ALLOWED IN PRE-OP WHERE PATIENTS ARE PREPPED FOR SURGERY.  ONLY 1 SUPPORT PERSON MAY BE PRESENT IN THE WAITING ROOM WHILE YOU ARE IN SURGERY.  IF YOU ARE TO BE ADMITTED, ONCE YOU ARE IN YOUR ROOM YOU WILL BE ALLOWED TWO (2) VISITORS. 1 (ONE) VISITOR MAY STAY OVERNIGHT BUT MUST ARRIVE TO THE ROOM BY 8pm.  Minor children may have two parents present. Special consideration for safety and communication needs will be reviewed on a case by case basis.  Special instructions:    Oral Hygiene is also important to reduce your risk of infection.  Remember - BRUSH YOUR TEETH THE MORNING OF SURGERY WITH YOUR REGULAR TOOTHPASTE   [place]- Preparing For Surgery  Before surgery, you can play an important role. Because skin is not sterile, your skin needs to  be as free of germs as possible. You can reduce the number of germs on your skin by washing with CHG (chlorahexidine gluconate) Soap before surgery.  CHG is an antiseptic cleaner which kills germs and bonds with the skin to continue killing germs even after washing.     Please do not use if you have an allergy to CHG or antibacterial soaps. If your skin becomes reddened/irritated stop using the CHG.  Do not shave (including legs and underarms) for at least 48 hours prior to first CHG shower. It is OK to shave your face.  Please follow these  instructions carefully.     Shower the NIGHT BEFORE SURGERY and the MORNING OF SURGERY with CHG Soap.   If you chose to wash your hair, wash your hair first as usual with your normal shampoo. After you shampoo, rinse your hair and body thoroughly to remove the shampoo.  Then [employer] and genitals (private parts) with your normal soap and rinse thoroughly to remove soap.  After that Use CHG Soap as you would any other liquid soap. You can apply CHG directly to the skin and wash gently with a scrungie or a clean washcloth.   Apply the CHG Soap to your body ONLY FROM THE NECK DOWN.  Do not use on open wounds or open sores. Avoid contact with your eyes, ears, mouth and genitals (private parts). Wash Face and genitals (private parts)  with your normal soap.   Wash thoroughly, paying special attention to the area where your surgery will be performed.  Thoroughly rinse your body with warm water from the neck down.  DO NOT shower/wash with your normal soap after using and rinsing off the CHG Soap.  Pat yourself dry with a CLEAN TOWEL.  Wear CLEAN PAJAMAS to bed the night before surgery  Place CLEAN SHEETS on your bed the night before your surgery  DO NOT SLEEP WITH PETS.   Day of Surgery:  Take a shower with CHG soap. Wear Clean/Comfortable clothing the morning of surgery Do not apply any deodorants/lotions.   Remember to brush your teeth WITH YOUR REGULAR TOOTHPASTE.   Please read over the following fact sheets that you were given.

## 2020-11-15 ENCOUNTER — Encounter (HOSPITAL_COMMUNITY)
Admission: RE | Admit: 2020-11-15 | Discharge: 2020-11-15 | Disposition: A | Discharge status: Home / Self Care | Payer: 59 | Source: Ambulatory Visit | Attending: Orthopaedic Surgery | Admitting: Orthopaedic Surgery

## 2020-11-15 ENCOUNTER — Other Ambulatory Visit: Payer: Self-pay

## 2020-11-15 ENCOUNTER — Encounter (HOSPITAL_COMMUNITY): Payer: Self-pay

## 2020-11-15 DIAGNOSIS — Z9013 Acquired absence of bilateral breasts and nipples: Secondary | ICD-10-CM | POA: Insufficient documentation

## 2020-11-15 DIAGNOSIS — Z9989 Dependence on other enabling machines and devices: Secondary | ICD-10-CM | POA: Diagnosis not present

## 2020-11-15 DIAGNOSIS — Z91199 Patient's noncompliance with other medical treatment and regimen due to unspecified reason: Secondary | ICD-10-CM | POA: Insufficient documentation

## 2020-11-15 DIAGNOSIS — Z888 Allergy status to other drugs, medicaments and biological substances status: Secondary | ICD-10-CM | POA: Insufficient documentation

## 2020-11-15 DIAGNOSIS — Z86718 Personal history of other venous thrombosis and embolism: Secondary | ICD-10-CM | POA: Diagnosis not present

## 2020-11-15 DIAGNOSIS — D649 Anemia, unspecified: Secondary | ICD-10-CM | POA: Diagnosis not present

## 2020-11-15 DIAGNOSIS — Z853 Personal history of malignant neoplasm of breast: Secondary | ICD-10-CM | POA: Diagnosis not present

## 2020-11-15 DIAGNOSIS — M1611 Unilateral primary osteoarthritis, right hip: Secondary | ICD-10-CM | POA: Diagnosis not present

## 2020-11-15 DIAGNOSIS — Z7901 Long term (current) use of anticoagulants: Secondary | ICD-10-CM | POA: Diagnosis not present

## 2020-11-15 DIAGNOSIS — Z01812 Encounter for preprocedural laboratory examination: Secondary | ICD-10-CM | POA: Diagnosis present

## 2020-11-15 DIAGNOSIS — G4733 Obstructive sleep apnea (adult) (pediatric): Secondary | ICD-10-CM | POA: Insufficient documentation

## 2020-11-15 HISTORY — DX: Essential (primary) hypertension: I10

## 2020-11-15 HISTORY — DX: Anemia, unspecified: D64.9

## 2020-11-15 HISTORY — DX: Gastro-esophageal reflux disease without esophagitis: K21.9

## 2020-11-15 HISTORY — DX: Type 2 diabetes mellitus without complications: E11.9

## 2020-11-15 LAB — CBC
HCT: 34.8 % — ABNORMAL LOW (ref 36.0–46.0)
Hemoglobin: 10.9 g/dL — ABNORMAL LOW (ref 12.0–15.0)
MCH: 25.3 pg — ABNORMAL LOW (ref 26.0–34.0)
MCHC: 31.3 g/dL (ref 30.0–36.0)
MCV: 80.9 fL (ref 80.0–100.0)
Platelets: 300 10*3/uL (ref 150–400)
RBC: 4.3 MIL/uL (ref 3.87–5.11)
RDW: 16.6 % — ABNORMAL HIGH (ref 11.5–15.5)
WBC: 6.2 10*3/uL (ref 4.0–10.5)
nRBC: 0 % (ref 0.0–0.2)

## 2020-11-15 LAB — TYPE AND SCREEN
ABO/RH(D): O POS
Antibody Screen: NEGATIVE

## 2020-11-15 LAB — BASIC METABOLIC PANEL
Anion gap: 10 (ref 5–15)
BUN: 17 mg/dL (ref 6–20)
CO2: 27 mmol/L (ref 22–32)
Calcium: 9.6 mg/dL (ref 8.9–10.3)
Chloride: 101 mmol/L (ref 98–111)
Creatinine, Ser: 0.73 mg/dL (ref 0.44–1.00)
GFR, Estimated: >60 mL/min (ref >60)
Glucose, Bld: 130 mg/dL — ABNORMAL HIGH (ref 70–99)
Potassium: 3.8 mmol/L (ref 3.5–5.1)
Sodium: 138 mmol/L (ref 135–145)

## 2020-11-15 LAB — HEMOGLOBIN A1C
Hgb A1c MFr Bld: 7 % — ABNORMAL HIGH (ref 4.8–5.6)
Mean Plasma Glucose: 154.2 mg/dL

## 2020-11-15 LAB — GLUCOSE, CAPILLARY: Glucose-Capillary: 122 mg/dL — ABNORMAL HIGH (ref 70–99)

## 2020-11-15 NOTE — Progress Notes (Signed)
PCP - Wenda Low Cardiologist - denies Dr Lindi Adie manages Eliquis   Chest x-ray - n/a EKG - 07/02/20 Stress Test - >20 years - patient had chest pain at time - patients stated everything checked out ok ECHO - > 20 years - dx with mitral valve prolapse Cardiac Cath - denies     DM - Type 2  Doesn't check at home   SA - yes, does not wear CPAP every day   Blood Thinner Instructions: Follow Instructions for Eliquis - Lovenox bridge, patient stated understanding   ERAS Protcol -yes clears until 0430, G2 ordered & given   COVID TEST- not needed, patient tested positive at home 10/30/20, Dr. Trevor Mace office aware.  See note from Almedia Balls on 10/30/20     Anesthesia review: yes allergy to Succinylcholine   Patient denies shortness of breath, fever, cough and chest pain at PAT appointment     All instructions explained to the patient, with a verbal understanding of the material. Patient agrees to go over the instructions while at home for a better understanding. Patient also instructed to self quarantine after being tested for COVID-19. The opportunity to ask questions was provided.

## 2020-11-16 ENCOUNTER — Other Ambulatory Visit: Payer: Self-pay

## 2020-11-16 ENCOUNTER — Encounter (HOSPITAL_BASED_OUTPATIENT_CLINIC_OR_DEPARTMENT_OTHER): Payer: Self-pay

## 2020-11-16 ENCOUNTER — Emergency Department (HOSPITAL_BASED_OUTPATIENT_CLINIC_OR_DEPARTMENT_OTHER): Payer: 59

## 2020-11-16 ENCOUNTER — Emergency Department (HOSPITAL_BASED_OUTPATIENT_CLINIC_OR_DEPARTMENT_OTHER)
Admission: EM | Admit: 2020-11-16 | Discharge: 2020-11-16 | Disposition: A | Discharge status: Home / Self Care | Payer: 59 | Attending: Emergency Medicine | Admitting: Emergency Medicine

## 2020-11-16 DIAGNOSIS — R748 Abnormal levels of other serum enzymes: Secondary | ICD-10-CM

## 2020-11-16 DIAGNOSIS — Z7984 Long term (current) use of oral hypoglycemic drugs: Secondary | ICD-10-CM | POA: Diagnosis not present

## 2020-11-16 DIAGNOSIS — R110 Nausea: Secondary | ICD-10-CM | POA: Insufficient documentation

## 2020-11-16 DIAGNOSIS — E039 Hypothyroidism, unspecified: Secondary | ICD-10-CM | POA: Diagnosis not present

## 2020-11-16 DIAGNOSIS — J45909 Unspecified asthma, uncomplicated: Secondary | ICD-10-CM | POA: Insufficient documentation

## 2020-11-16 DIAGNOSIS — Z96652 Presence of left artificial knee joint: Secondary | ICD-10-CM | POA: Diagnosis not present

## 2020-11-16 DIAGNOSIS — Z7901 Long term (current) use of anticoagulants: Secondary | ICD-10-CM | POA: Insufficient documentation

## 2020-11-16 DIAGNOSIS — Z853 Personal history of malignant neoplasm of breast: Secondary | ICD-10-CM | POA: Insufficient documentation

## 2020-11-16 DIAGNOSIS — Z8616 Personal history of COVID-19: Secondary | ICD-10-CM | POA: Insufficient documentation

## 2020-11-16 DIAGNOSIS — R5383 Other fatigue: Secondary | ICD-10-CM | POA: Diagnosis not present

## 2020-11-16 DIAGNOSIS — R1011 Right upper quadrant pain: Secondary | ICD-10-CM

## 2020-11-16 DIAGNOSIS — R002 Palpitations: Secondary | ICD-10-CM

## 2020-11-16 DIAGNOSIS — Z79899 Other long term (current) drug therapy: Secondary | ICD-10-CM | POA: Diagnosis not present

## 2020-11-16 DIAGNOSIS — E119 Type 2 diabetes mellitus without complications: Secondary | ICD-10-CM | POA: Insufficient documentation

## 2020-11-16 LAB — CBC
HCT: 34 % — ABNORMAL LOW (ref 36.0–46.0)
Hemoglobin: 10.8 g/dL — ABNORMAL LOW (ref 12.0–15.0)
MCH: 25.4 pg — ABNORMAL LOW (ref 26.0–34.0)
MCHC: 31.8 g/dL (ref 30.0–36.0)
MCV: 80 fL (ref 80.0–100.0)
Platelets: 303 10*3/uL (ref 150–400)
RBC: 4.25 MIL/uL (ref 3.87–5.11)
RDW: 16.8 % — ABNORMAL HIGH (ref 11.5–15.5)
WBC: 8.8 10*3/uL (ref 4.0–10.5)
nRBC: 0 % (ref 0.0–0.2)

## 2020-11-16 LAB — COMPREHENSIVE METABOLIC PANEL
ALT: 17 U/L (ref 0–44)
AST: 18 U/L (ref 15–41)
Albumin: 4.3 g/dL (ref 3.5–5.0)
Alkaline Phosphatase: 77 U/L (ref 38–126)
Anion gap: 11 (ref 5–15)
BUN: 19 mg/dL (ref 6–20)
CO2: 25 mmol/L (ref 22–32)
Calcium: 9.5 mg/dL (ref 8.9–10.3)
Chloride: 103 mmol/L (ref 98–111)
Creatinine, Ser: 0.63 mg/dL (ref 0.44–1.00)
GFR, Estimated: >60 mL/min (ref >60)
Glucose, Bld: 106 mg/dL — ABNORMAL HIGH (ref 70–99)
Potassium: 3.7 mmol/L (ref 3.5–5.1)
Sodium: 139 mmol/L (ref 135–145)
Total Bilirubin: 0.2 mg/dL — ABNORMAL LOW (ref 0.3–1.2)
Total Protein: 7.3 g/dL (ref 6.5–8.1)

## 2020-11-16 LAB — LIPASE, BLOOD: Lipase: 101 U/L — ABNORMAL HIGH (ref 11–51)

## 2020-11-16 LAB — MAGNESIUM: Magnesium: 1.9 mg/dL (ref 1.7–2.4)

## 2020-11-16 IMAGING — US US ABDOMEN LIMITED
1 series · 14 of 25 positions shown · non-contrast
Comparison: None.

CLINICAL DATA: Right upper quadrant pain with nausea and vomiting

EXAM:
ULTRASOUND ABDOMEN LIMITED RIGHT UPPER QUADRANT

[Series 1: us abdomen limited ruq (liver/gb) · 14 of 81 slices shown]
[im 1/81]
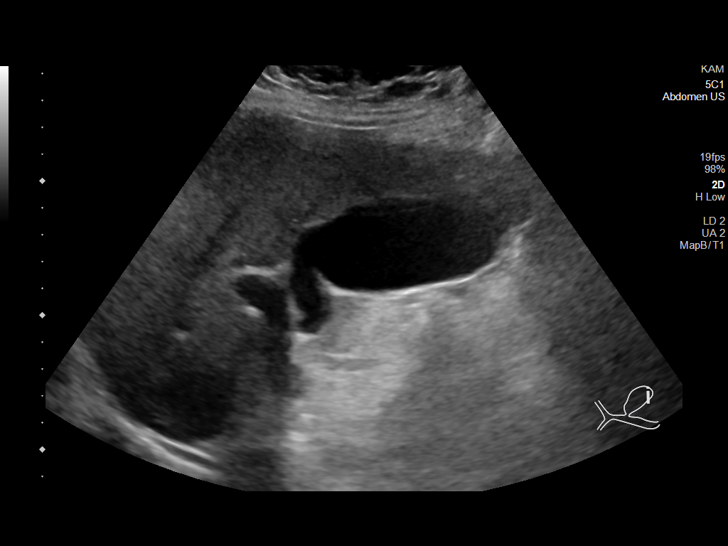
[im 7/81]
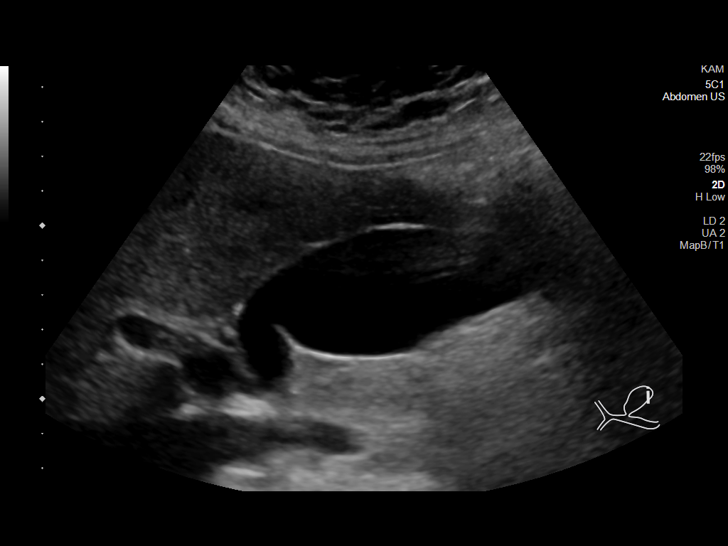
[im 14/81]
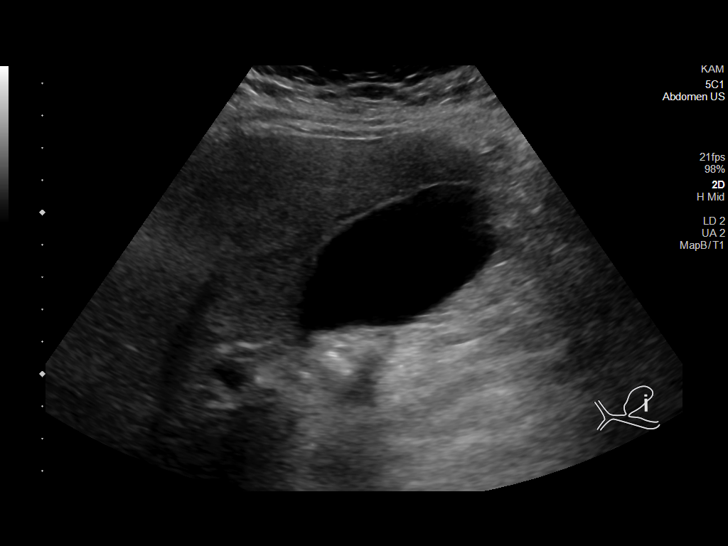
[im 21/81]
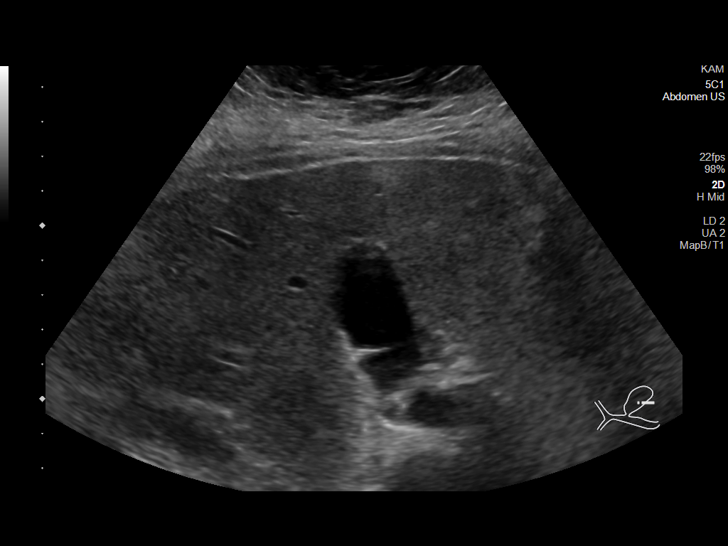
[im 27/81]
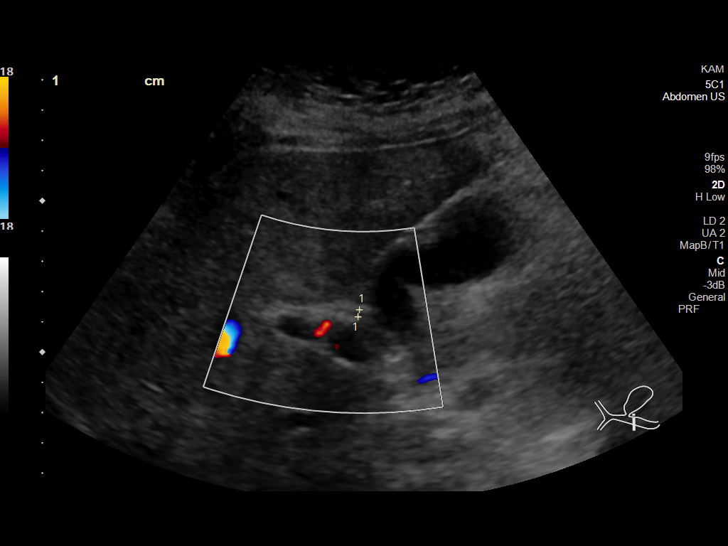
[im 31/81]
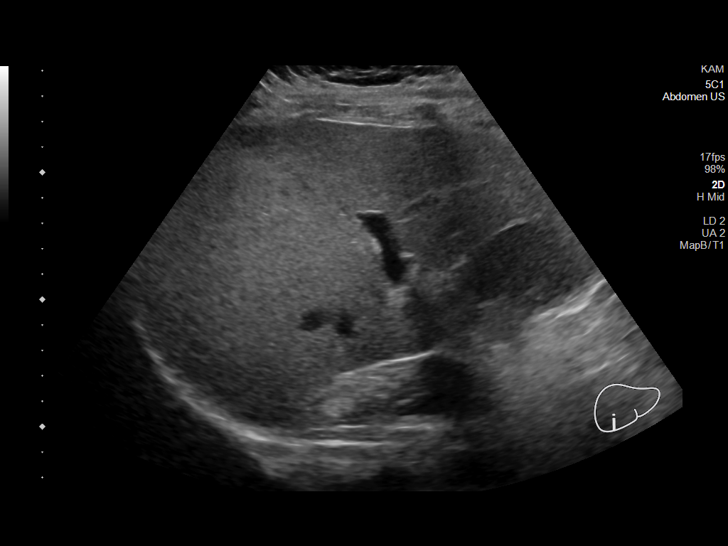
[im 37/81]
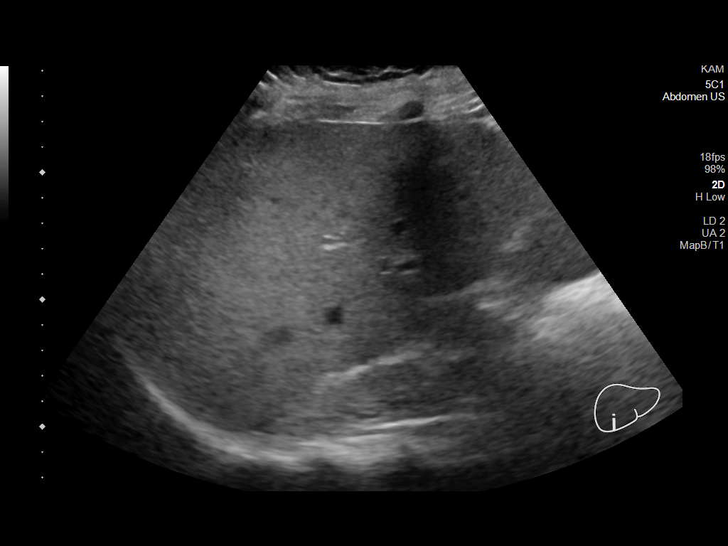
[im 44/81]
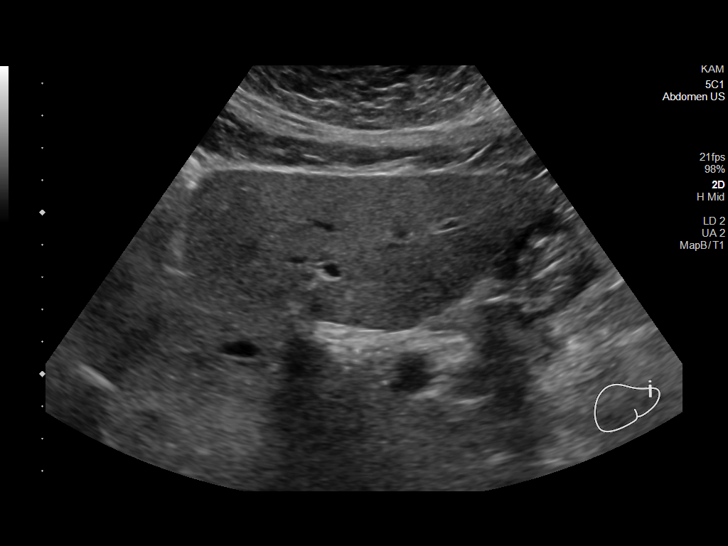
[im 51/81]
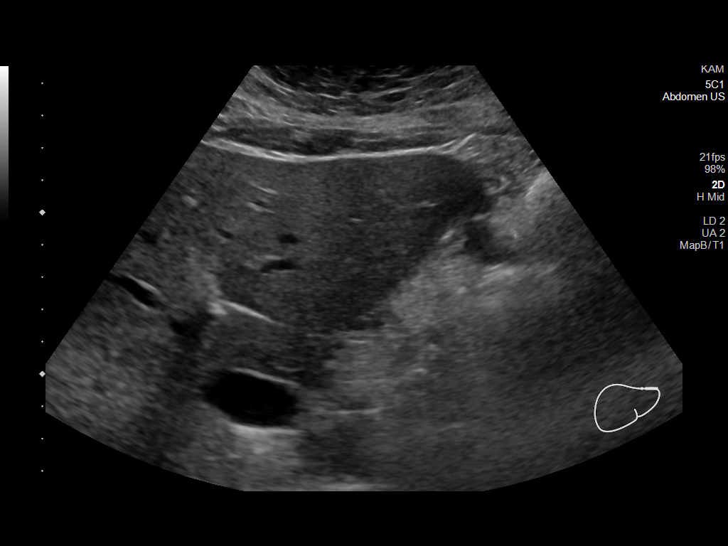
[im 54/81]
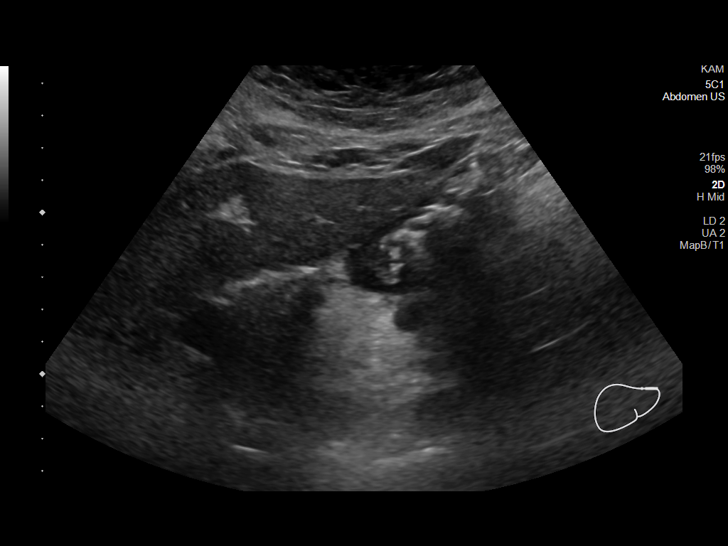
[im 61/81]
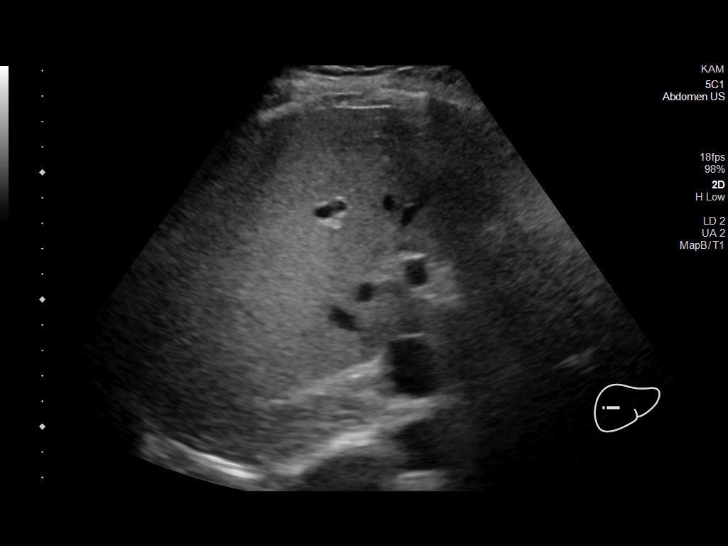
[im 67/81]
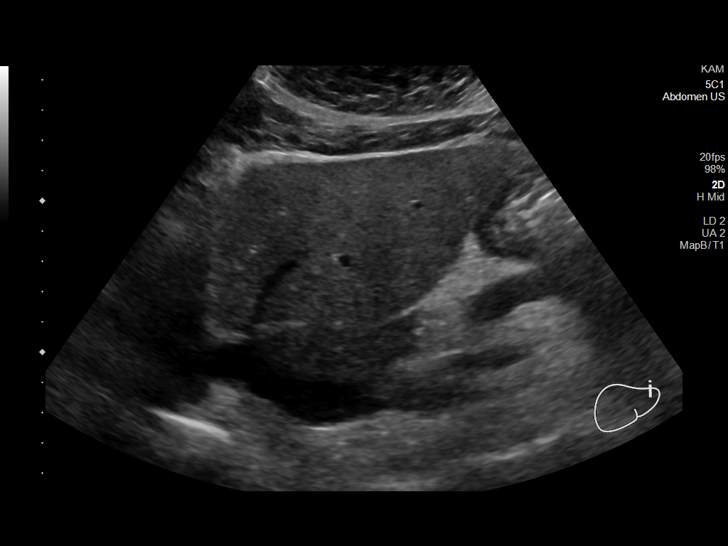
[im 74/81]
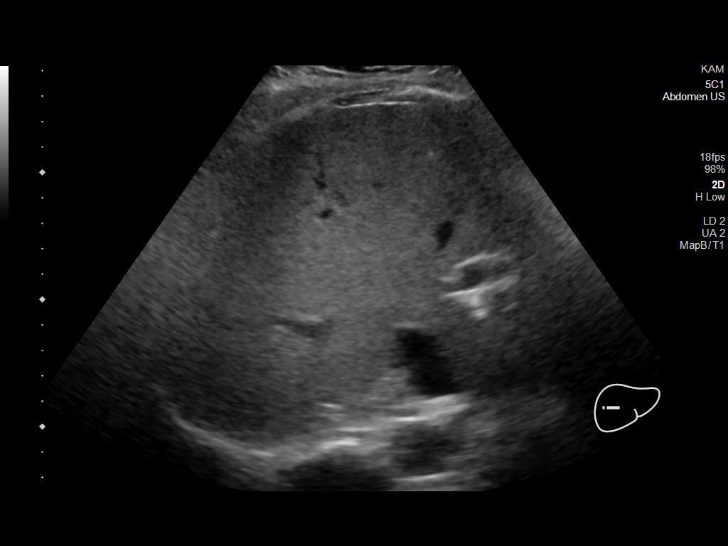
[im 81/81]
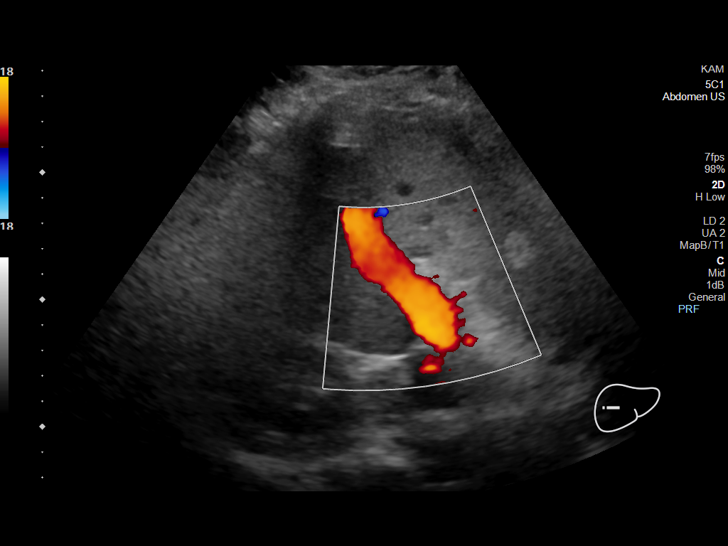

[14 of 25 positions shown; findings below may reference images not displayed]

FINDINGS: Gallbladder:

No gallstones or wall thickening visualized. No sonographic Murphy
sign noted by sonographer.

Common bile duct:

Diameter: 2.4 mm.

Liver:

Mild increased echogenicity is noted consistent with fatty
infiltration. No focal mass is seen. Portal vein is patent on color
Doppler imaging with normal direction of blood flow towards the
liver.

Other: None.
IMPRESSION: Fatty liver.

No other focal abnormality is noted.

## 2020-11-16 MED ORDER — ONDANSETRON HCL 4 MG/2ML IJ SOLN
4.0000 mg | Freq: Once | INTRAMUSCULAR | Status: AC
  Administered 2020-11-16: 4 mg via INTRAVENOUS
  Filled 2020-11-16: qty 2

## 2020-11-16 MED ORDER — SODIUM CHLORIDE 0.9 % IV BOLUS (SEPSIS)
500.0000 mL | Freq: Once | INTRAVENOUS | Status: AC
  Administered 2020-11-16: 500 mL via INTRAVENOUS

## 2020-11-16 MED ORDER — SODIUM CHLORIDE 0.9 % IV SOLN
1000.0000 mL | INTRAVENOUS | Status: DC
  Administered 2020-11-16: 1000 mL via INTRAVENOUS

## 2020-11-16 NOTE — ED Provider Notes (Signed)
[facility] EMERGENCY DEPT Provider Note   CSN: [id number] Arrival date & time: 11/16/20  1925     History Chief Complaint  Patient presents with   Palpitations   Hypertension    Sierra Perez is a 60 y.o. female.   Palpitations Hypertension   Patient states that a couple weeks ago she was diagnosed with COVID.  Patient states her symptoms were severe including fevers and a course of antibiotics but eventually her symptoms resolved.  She has been doing well the last several days until today when she began feeling fatigued and nauseated.  She felt like she had to vomit and eventually she induced vomiting to see if she would feel better.  She took her blood pressure at home and it was elevated around 140/110.  She felt tremulous and felt her heart racing.  Symptoms persisted so she came to the ED.  At this time the symptoms have resolved and she is feeling better.  She is not having any palpitations.  She has not had any chest pain.  Is not any abdominal pain.  She is not having any headache.  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, her-2 positive, ER/PR negative   Clotting disorder ([place])    testing was negative, h/o DVT while on treatment   Complex regional pain syndrome i of right lower limb    RDS   Diabetes mellitus without complication ([place])    DVT (deep venous thrombosis) ([place])    started in 2015, multiple   Family history of breast cancer    Family history of colon cancer    Fibroid    GERD (gastroesophageal reflux disease)    Heart murmur    HPV in female    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   Hypertension    Hypothyroidism    IBS (irritable bowel syndrome)    Mitral valve prolapse    Personal history of malignant neoplasm of breast    Thyroid disease     Patient Active Problem List   Diagnosis Date Noted   Breast  cancer ([place]) 07/08/2020   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   H/O total hysterectomy 06/11/2020   Genetic testing 05/27/2020   Family history of breast cancer    Family history of colon cancer    Personal history of malignant neoplasm of breast    Unilateral primary osteoarthritis, right knee 02/26/2020   Unilateral primary osteoarthritis, 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   DVT (deep venous thrombosis) ([place]) 05/04/2018   Malignant neoplasm of upper-outer quadrant of right breast in female, estrogen receptor negative ([place]) 05/03/2018    Past Surgical History:  Procedure Laterality Date   ABDOMINAL HYSTERECTOMY  2010   fibroids, h/o abnormal pap smears   AXILLARY SENTINEL NODE BIOPSY Right 07/08/2020   Procedure: RIGHT AXILLARY SENTINEL NODE BIOPSY;  Surgeon: Rolm Bookbinder, MD;  Location: [facility];  Service: General;  Laterality: Right;   BREAST RECONSTRUCTION WITH PLACEMENT OF TISSUE EXPANDER AND FLEX HD (ACELLULAR HYDRATED DERMIS) Bilateral 07/08/2020   Procedure: BILATERAL BREAST RECONSTRUCTION WITH  FLEX HD (ACELLULAR HYDRATED DERMIS),DIRECT IMPLANT RECONSTRUCTION;  Surgeon: Cindra Presume, MD;  Location: [facility];  Service: Plastics;  Laterality: Bilateral;   BREAST SURGERY Right 2015   right lumpectomy with reduction and lift   COLONOSCOPY  DENTAL SURGERY     ESOPHAGOGASTRODUODENOSCOPY     FOOT SURGERY     dislocated toe   LAPAROSCOPIC GASTRIC SLEEVE RESECTION  2014   LAPAROSCOPIC OOPHERECTOMY Bilateral 2016   REPLACEMENT TOTAL KNEE Left 2018   also had 3 prior arthroscopies   TOTAL MASTECTOMY Bilateral 07/08/2020   Procedure: BILATERAL TOTAL MASTECTOMY;  Surgeon: Rolm Bookbinder, MD;  Location: [facility];  Service: General;  Laterality: Bilateral;   VENA CAVA FILTER PLACEMENT  2017     OB History     Gravida  1   Para  1   Term  1   Preterm      AB      Living  1      SAB      IAB      Ectopic      Multiple      Live Births  1            Family History  Problem Relation Age of Onset   Hypertension Mother    Skin cancer Mother    Diabetes Father    Kidney disease Father    Dementia Father    Factor VIII deficiency Father    Bladder Cancer Maternal Grandmother        dx in late 9s; smoker   Stroke Maternal Grandmother    Lung cancer Maternal Grandfather    Colon cancer Maternal Grandfather 72   Thyroid cancer Maternal Grandfather        dx in his 50s   Breast cancer Paternal Grandmother        dx in 21s   Prostate cancer Other        MGMs brother    Social History   Tobacco Use   Smoking status: Never   Smokeless tobacco: Never  Vaping Use   Vaping Use: Never used  Substance Use Topics   Alcohol use: Yes    Comment: occ   Drug use: Not Currently    Types: Marijuana    Home Medications Prior to Admission medications   Medication Sig Start Date End Date Taking? Authorizing Provider  albuterol (VENTOLIN HFA) 108 (90 Base) MCG/ACT inhaler Inhale 1 puff into the lungs every 6 (six) hours as needed for wheezing or shortness of breath. 01/18/19   [provider]  amLODipine (NORVASC) 5 MG tablet Take 5 mg by mouth daily. 06/05/20   [provider]  anastrozole (ARIMIDEX) 1 MG tablet Take 1 tablet (1 mg total) by mouth daily. Patient taking differently: Take 1 mg by mouth at bedtime. 05/14/20   Nicholas Lose, MD  CALCIUM CITRATE PO Take 2 tablets by mouth daily. Zinc  Copper Vit d  Manganese Sodium    [provider]  Cholecalciferol (VITAMIN D-3) 25 MCG (1000 UT) CAPS Take 1,000 Units by mouth daily.    [provider]  DULoxetine (CYMBALTA) 30 MG capsule Take 30 mg by mouth See admin instructions. Take with 60  mg for a total 90 mg in the morning 08/01/20   [provider]  DULoxetine (CYMBALTA) 60 MG capsule Take 60 mg by mouth See admin instructions. Take with 30 mg for a total of 90 mg daily 07/15/17   [provider]  ELIQUIS 5 MG TABS tablet  TAKE 1 TABLET(5 MG) BY MOUTH TWICE DAILY 11/08/20   Nicholas Lose, MD  enoxaparin (LOVENOX) 100 MG/ML injection Inject 1 mL (100 mg total) into the skin every 12 (twelve) hours. 11/07/20  Nicholas Lose, MD  fluticasone (FLONASE) 50 MCG/ACT nasal spray Place 2 sprays into both nostrils daily.    [provider]  gabapentin (NEURONTIN) 800 MG tablet Take 1 tablet (800 mg total) by mouth 2 (two) times daily. Patient taking differently: Take 600 mg by mouth 3 (three) times daily. 07/18/20   Nicholas Lose, MD  guaiFENesin (MUCINEX) 600 MG 12 hr tablet Take 1,200 mg by mouth 2 (two) times daily.    [provider]  ipratropium (ATROVENT) 0.03 % nasal spray Place 2 sprays into both nostrils 2 (two) times daily. 05/21/19   [provider]  Lactobacillus Rhamnosus, GG, (CULTURELLE PO) Take 1 capsule by mouth daily.    [provider]  levothyroxine (SYNTHROID) 50 MCG tablet Take 50 mcg by mouth every morning. 05/18/19   [provider]  metFORMIN (GLUCOPHAGE) 500 MG tablet Take 500 mg by mouth 2 (two) times daily. 09/04/20   [provider]  pantoprazole (PROTONIX) 40 MG tablet Take 1 tablet (40 mg total) by mouth daily. 11/09/18   Nicholas Lose, MD  Scar Treatment Products [facility] ADVANCED SCAR GEL EX) Apply 1 application topically daily.    [provider]  sennosides-docusate sodium (SENOKOT-S) 8.6-50 MG tablet Take 2 tablets by mouth daily.    [provider]  valACYclovir (VALTREX) 500 MG tablet Take 1 tablet (500 mg total) by mouth daily. 06/11/20   Megan Salon, MD    Allergies    Succinylcholine, Keflex [cephalexin], and Sulfa antibiotics  Review of Systems   Review of Systems  Cardiovascular:  Positive for palpitations.  All other systems reviewed and are negative.  Physical Exam Updated Vital Signs BP 129/78   Pulse 71   Temp 98 F (36.7 C)   Resp 14   Ht 1.727 m ($Remove'5\' 8"'qramvdN$ )   Wt 104.3 kg   SpO2 100%   BMI 34.97 kg/m    Physical Exam Vitals and nursing note reviewed.  Constitutional:      General: She is not in acute distress.    Appearance: She is well-developed.  HENT:     Head: Normocephalic and atraumatic.     Right Ear: External ear normal.     Left Ear: External ear normal.  Eyes:     General: No scleral icterus.       Right eye: No discharge.        Left eye: No discharge.     Conjunctiva/sclera: Conjunctivae normal.  Neck:     Trachea: No tracheal deviation.  Cardiovascular:     Rate and Rhythm: Normal rate and regular rhythm.  Pulmonary:     Effort: Pulmonary effort is normal. No respiratory distress.     Breath sounds: Normal breath sounds. No stridor. No wheezing or rales.  Abdominal:     General: Bowel sounds are normal. There is no distension.     Palpations: Abdomen is soft.     Tenderness: There is no abdominal tenderness. There is no guarding or rebound.  Musculoskeletal:        General: No tenderness or deformity.     Cervical back: Neck supple.  Skin:    General: Skin is warm and dry.     Findings: No rash.  Neurological:     General: No focal deficit present.     Mental Status: She is alert.     Cranial Nerves: No cranial nerve deficit (no facial droop, extraocular movements intact, no slurred speech).     Sensory: No sensory  deficit.     Motor: No abnormal muscle tone or seizure activity.     Coordination: Coordination normal.  Psychiatric:        Mood and Affect: Mood normal.    ED Results / Procedures / Treatments   Labs (all labs ordered are listed, but only abnormal results are displayed) Labs Reviewed  CBC - Abnormal; Notable for the following components:      Result Value   Hemoglobin 10.8 (*)    HCT 34.0 (*)    MCH 25.4 (*)    RDW 16.8 (*)    All other components within normal limits  COMPREHENSIVE METABOLIC PANEL - Abnormal; Notable for the following components:   Glucose, Bld 106 (*)    Total Bilirubin 0.2 (*)    All other components within  normal limits  LIPASE, BLOOD - Abnormal; Notable for the following components:   Lipase 101 (*)    All other components within normal limits  MAGNESIUM    EKG None  Radiology US Abdomen Limited RUQ (LIVER/GB)  Result Date: 11/16/2020 CLINICAL DATA:  Right upper quadrant pain with nausea and vomiting EXAM: ULTRASOUND ABDOMEN LIMITED RIGHT UPPER QUADRANT COMPARISON:  None. FINDINGS: Gallbladder: No gallstones or wall thickening visualized. No sonographic Murphy sign noted by sonographer. Common bile duct: Diameter: 2.4 mm. Liver: Mild increased echogenicity is noted consistent with fatty infiltration. No focal mass is seen. Portal vein is patent on color Doppler imaging with normal direction of blood flow towards the liver. Other: None. IMPRESSION: Fatty liver. No other focal abnormality is noted. Electronically Signed   By: Inez Catalina M.D.   On: 11/16/2020 22:50    Procedures Procedures   Medications Ordered in ED Medications  sodium chloride 0.9 % bolus 500 mL (500 mLs Intravenous New Bag/Given 11/16/20 2122)    Followed by  0.9 %  sodium chloride infusion (1,000 mLs Intravenous New Bag/Given 11/16/20 2123)  ondansetron (ZOFRAN) injection 4 mg (4 mg Intravenous Given 11/16/20 2122)    ED Course  I have reviewed the triage vital signs and the nursing notes.  Pertinent labs & imaging results that were available during my care of the patient were reviewed by me and considered in my medical decision making (see chart for details).  Clinical Course as of 11/16/20 2315  Sat Nov 16, 2020  2143 CBC shows hemoglobin stable at 10.8 [JK]  8466 Metabolic panel unremarkable [JK]  2144 Lipase increased at 101.  Magnesium normal [JK]  2215 Discussed results with patient.  Friend provided additional history that before the episode earlier she was complaining of some pain in her right upper quadrant.  We will proceed with ultrasound to assess for gallstones [JK]  2301 Ultrasound without signs  of gallstones or biliary obstruction [JK]    Clinical Course User Index [JK] Dorie Rank, MD   MDM Rules/Calculators/A&P                           Patient presented to the ED for evaluation of an episode of nausea palpitations.  Patient denied having any chest pain but did notice that her blood pressure was elevated earlier.  In the ED she is not hypertensive.  EKG is reassuring.  Symptoms are not suggestive of acute coronary syndrome or aortic dissection.  She is pain-free here and asymptomatic.  No dysrhythmia noted on her EKG.  Cardiac monitoring is unremarkable in the ER.  Patient did complaining some pain in her upper  abdomen before the episode.  Lipase was slightly elevated so a right upper quadrant ultrasound was performed to assess for the possibility of gallstones.  This was negative.  And has remained asymptomatic in the ER.  At this time no signs of any acute emergency medical condition.  She appears stable for discharge and outpatient follow-up.  Consider outpatient cardiac monitoring if she has any recurrent episodes of palpitations. Final Clinical Impression(s) / ED Diagnoses Final diagnoses:  RUQ pain  Palpitations  Elevated lipase    Rx / DC Orders ED Discharge Orders     None        Dorie Rank, MD 11/16/20 2317

## 2020-11-16 NOTE — Discharge Instructions (Signed)
Follow-up with your primary care doctor to have your lipase enzyme rechecked.  If you have recurrent episodes of palpitations discuss having an outpatient cardiac monitor.  Return as needed for worsening or recurrent symptoms

## 2020-11-16 NOTE — ED Triage Notes (Addendum)
Pt presents with acute onset of nausea, tremors, heart racing, high blood pressure, feeling fatigued, self induced vomit x1 starting today. Pt reports covid 2.5 weeks ago. Unsure if these symptoms are related to covid. Pt reports HR as high 120's, DBP 120's, unsure of SBP, maybe 140's

## 2020-11-18 NOTE — Anesthesia Preprocedure Evaluation (Addendum)
Anesthesia Evaluation  Patient identified by MRN, date of birth, ID band Patient awake    Reviewed: Allergy & Precautions, NPO status , Patient's Chart, lab work & pertinent test results  Airway Mallampati: III  TM Distance: >3 FB Neck ROM: Full    Dental  (+) Teeth Intact, Dental Advisory Given   Pulmonary asthma ,    breath sounds clear to auscultation       Cardiovascular hypertension, Pt. on medications + Valvular Problems/Murmurs MVP  Rhythm:Regular Rate:Normal     Neuro/Psych Anxiety    GI/Hepatic GERD  Medicated,  Endo/Other  diabetes, Type 2, Oral Hypoglycemic AgentsHypothyroidism   Renal/GU      Musculoskeletal  (+) Arthritis ,   Abdominal Normal abdominal exam  (+)   Peds  Hematology  (+) anemia ,   Anesthesia Other Findings   Reproductive/Obstetrics                           Anesthesia Physical Anesthesia Plan  ASA: 3  Anesthesia Plan: Spinal   Post-op Pain Management:    Induction: Intravenous  PONV Risk Score and Plan: 2 and Ondansetron and Propofol infusion  Airway Management Planned: Natural Airway and Simple Face Mask  Additional Equipment: None  Intra-op Plan:   Post-operative Plan:   Informed Consent: I have reviewed the patients History and Physical, chart, labs and discussed the procedure including the risks, benefits and alternatives for the proposed anesthesia with the patient or authorized representative who has indicated his/her understanding and acceptance.       Plan Discussed with: CRNA  Anesthesia Plan Comments: (Eliquis bridge with Lovenox BID, last dose at 9pm  PAT note by Karoline Caldwell, PA-C:  Follows with hematologist/oncologist Dr. Lindi Adie for history of breast cancer s/p neoadjuvant chemotherapy with [place] followed by 1 year of Herceptin maintenance,right breast lumpectomy 02/13/2013. Recent recurrence and now s/p bilateral total  mastectomy and reconstruction 07/08/20. 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 Previously had DVT on Coumadin. She has now maintained on Eliquis and has done well on this.   Anticoagulation was addressed in progress note by Heinz Knuckles, RN on 11/07/20 stating, "Pt undergoing Hip replacement 11/19/20. Per MD pt to stop taking Eliquis on 11/14/20 and start self administering Lovenox 100 mg q12 hrs subcu x 4 days. Pt to not take any anticoagulations day of surgery and pt to resume Eliquis day after surgery."  Patient has reported allergy to succinylcholine. She told preop RN that she had "muscle soreness" following surgery approximately 18 years ago. She has had multiple surgeries since that time. Records in care everywhere reviewed. Previous anesthesia notes report that patient had difficulty with muscle movement following surgery and it was felt possibly due to succinylcholine. Anesthesia records available in Care Everywhere show that patient has received either rocuronium or vecuronium for subsequent intubations. I previously called and spoke with the patient prior to her breast surgery in June of 2022 to clarify further. She states that she was having multiple endoscopies ~20 years ago for evaluation of polyps and at one point was referred to a tertiary hospital for specific procedure and received succinylcholine at that time. She reports she did okay immediately postoperatively but on postop day 1 at home she noticed profound muscle weakness and fatigue. When she reported this, she said she was told that it may have been due to succinylcholine as that was the only anesthetic medication that  she had not previously received. She denies any muscle rigidity or fevers. The symptoms did resolve on their own. The patient does not believe she has ever received succinylcholine again since that initial event. She has never had any additional testing or  workup for this.  Hx of OSA on CPAP. Patient reports noncompliance, although she is trying to be better about this.  Preop labs reviewed, mild anemia with Hgb 10.8, otherwise unremarkable.   EKG 11/06/20:NSR. Rate 95.  Cardio oncology Limited echo 08/30/2013 (Care Everywhere): - Left ventricle: The cavity size was normal. Wall thickness  was normal. Systolic function was normal. The estimated  ejection fraction was 57%. Wall motion was normal; there  were no regional wall motion abnormalities. Left  ventricular diastolic function parameters were normal. The  global longitudinal strain was -22%.Ejection fraction  (EF) was calculated using 3d full volume imaging method.  Impressions: No significant change in LVEF or GLS compared  with March 2015   TTE 01/09/2013 (Care Everywhere): - Left ventricle: The cavity size was normal. Wall thickness  was normal. Systolic function was normal. The estimated  ejection fraction was in the range of 55% to 60%. Wall  motion was normal; there were no regional wall motion  abnormalities. Left ventricular diastolic function  parameters were normal. Aortic valve: Mildly thickened  leaflets. Mitral valve: Mildly thickened leaflets. Left  atrium: The atrium was mildly dilated. )      Anesthesia Quick Evaluation

## 2020-11-18 NOTE — Progress Notes (Signed)
Anesthesia Chart Review:  Follows with hematologist/oncologist Dr. Lindi Adie for history of breast cancer s/p neoadjuvant chemotherapy with [place] followed by 1 year of Herceptin maintenance, right breast lumpectomy 02/13/2013. Recent recurrence and now s/p bilateral total mastectomy and reconstruction 07/08/20.  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  Previously had DVT on Coumadin.  She has now maintained on Eliquis and has done well on this.    Anticoagulation was addressed in progress note by Heinz Knuckles, RN on 11/07/20 stating, "Pt undergoing Hip replacement 11/19/20.  Per MD pt to stop taking Eliquis on 11/14/20 and start self administering Lovenox 100 mg q12 hrs subcu x 4 days.  Pt to not take any anticoagulations day of surgery and pt to resume Eliquis day after surgery."   Patient has reported allergy to succinylcholine.  She told preop RN that she had "muscle soreness" following surgery approximately 18 years ago.  She has had multiple surgeries since that time.  Records in care everywhere reviewed.  Previous anesthesia notes report that patient had difficulty with muscle movement following surgery and it was felt possibly due to succinylcholine.  Anesthesia records available in Care Everywhere show that patient has received either rocuronium or vecuronium for subsequent intubations.  I previously called and spoke with the patient prior to her breast surgery in June of 2022 to clarify further.  She states that she was having multiple endoscopies ~20 years ago for evaluation of polyps and at one point was referred to a tertiary hospital for specific procedure and received succinylcholine at that time.  She reports she did okay immediately postoperatively but on postop day 1 at home she noticed profound muscle weakness and fatigue.  When she reported this, she said she was told that it may have been due to succinylcholine as that was the only anesthetic  medication that she had not previously received.  She denies any muscle rigidity or fevers.  The symptoms did resolve on their own.  The patient does not believe she has ever received succinylcholine again since that initial event. She has never had any additional testing or workup for this.   Hx of OSA on CPAP.  Patient reports noncompliance, although she is trying to be better about this.   Preop labs reviewed, mild anemia with Hgb 10.8, otherwise unremarkable.    EKG 11/06/20: NSR.  Rate 95.   Cardio oncology Limited echo 08/30/2013 (Care Everywhere): - Left ventricle: The cavity size was normal. Wall thickness    was normal. Systolic function was normal. The estimated    ejection fraction was 57%. Wall motion was normal; there    were no regional wall motion abnormalities. Left    ventricular diastolic function parameters were normal. The    global longitudinal strain was -22%. Ejection fraction    (EF) was calculated using 3d full volume imaging method.  Impressions:  No significant change in LVEF or GLS compared  with March 2015     TTE 01/09/2013 (Care Everywhere): - Left ventricle: The cavity size was normal. Wall thickness    was normal. Systolic function was normal. The estimated    ejection fraction was in the range of 55% to 60%. Wall    motion was normal; there were no regional wall motion    abnormalities. Left ventricular diastolic function    parameters were normal. Aortic valve: Mildly thickened    leaflets. Mitral valve: Mildly thickened leaflets. Left    atrium: The atrium  was mildly dilated.      Sierra Perez [facility] Short Stay Center/Anesthesiology Phone [phone] 11/18/2020 10:15 AM

## 2020-11-19 ENCOUNTER — Encounter (HOSPITAL_COMMUNITY)
Admission: RE | Disposition: A | Discharge status: Home / Self Care | Payer: Self-pay | Source: Home / Self Care | Attending: Orthopaedic Surgery

## 2020-11-19 ENCOUNTER — Ambulatory Visit (HOSPITAL_COMMUNITY): Payer: 59 | Admitting: Physician Assistant

## 2020-11-19 ENCOUNTER — Encounter (HOSPITAL_COMMUNITY): Payer: Self-pay | Admitting: Orthopaedic Surgery

## 2020-11-19 ENCOUNTER — Observation Stay (HOSPITAL_COMMUNITY)
Admission: RE | Admit: 2020-11-19 | Discharge: 2020-11-21 | Disposition: A | Discharge status: Home / Self Care | Payer: 59 | Attending: Orthopaedic Surgery | Admitting: Orthopaedic Surgery

## 2020-11-19 ENCOUNTER — Ambulatory Visit (HOSPITAL_COMMUNITY): Payer: 59

## 2020-11-19 ENCOUNTER — Observation Stay (HOSPITAL_COMMUNITY): Payer: 59

## 2020-11-19 ENCOUNTER — Ambulatory Visit (HOSPITAL_COMMUNITY): Payer: 59 | Admitting: Certified Registered Nurse Anesthetist

## 2020-11-19 DIAGNOSIS — Z419 Encounter for procedure for purposes other than remedying health state, unspecified: Secondary | ICD-10-CM

## 2020-11-19 DIAGNOSIS — M1611 Unilateral primary osteoarthritis, right hip: Secondary | ICD-10-CM

## 2020-11-19 DIAGNOSIS — E039 Hypothyroidism, unspecified: Secondary | ICD-10-CM | POA: Insufficient documentation

## 2020-11-19 DIAGNOSIS — J45909 Unspecified asthma, uncomplicated: Secondary | ICD-10-CM | POA: Insufficient documentation

## 2020-11-19 DIAGNOSIS — Z8673 Personal history of transient ischemic attack (TIA), and cerebral infarction without residual deficits: Secondary | ICD-10-CM | POA: Insufficient documentation

## 2020-11-19 DIAGNOSIS — Z96641 Presence of right artificial hip joint: Secondary | ICD-10-CM

## 2020-11-19 DIAGNOSIS — I10 Essential (primary) hypertension: Secondary | ICD-10-CM | POA: Insufficient documentation

## 2020-11-19 HISTORY — PX: TOTAL HIP ARTHROPLASTY: SHX124

## 2020-11-19 LAB — SURGICAL PCR SCREEN
MRSA, PCR: NEGATIVE
Staphylococcus aureus: NEGATIVE

## 2020-11-19 LAB — GLUCOSE, CAPILLARY
Glucose-Capillary: 100 mg/dL — ABNORMAL HIGH (ref 70–99)
Glucose-Capillary: 135 mg/dL — ABNORMAL HIGH (ref 70–99)
Glucose-Capillary: 168 mg/dL — ABNORMAL HIGH (ref 70–99)
Glucose-Capillary: 272 mg/dL — ABNORMAL HIGH (ref 70–99)
Glucose-Capillary: 330 mg/dL — ABNORMAL HIGH (ref 70–99)

## 2020-11-19 LAB — ABO/RH: ABO/RH(D): O POS

## 2020-11-19 IMAGING — DX DG PORTABLE PELVIS
1 series · 1 of 1 positions shown · non-contrast
Comparison: [DATE]

CLINICAL DATA: Postop hip replacement

EXAM:
PORTABLE PELVIS 1-2 VIEWS

[pelvis]
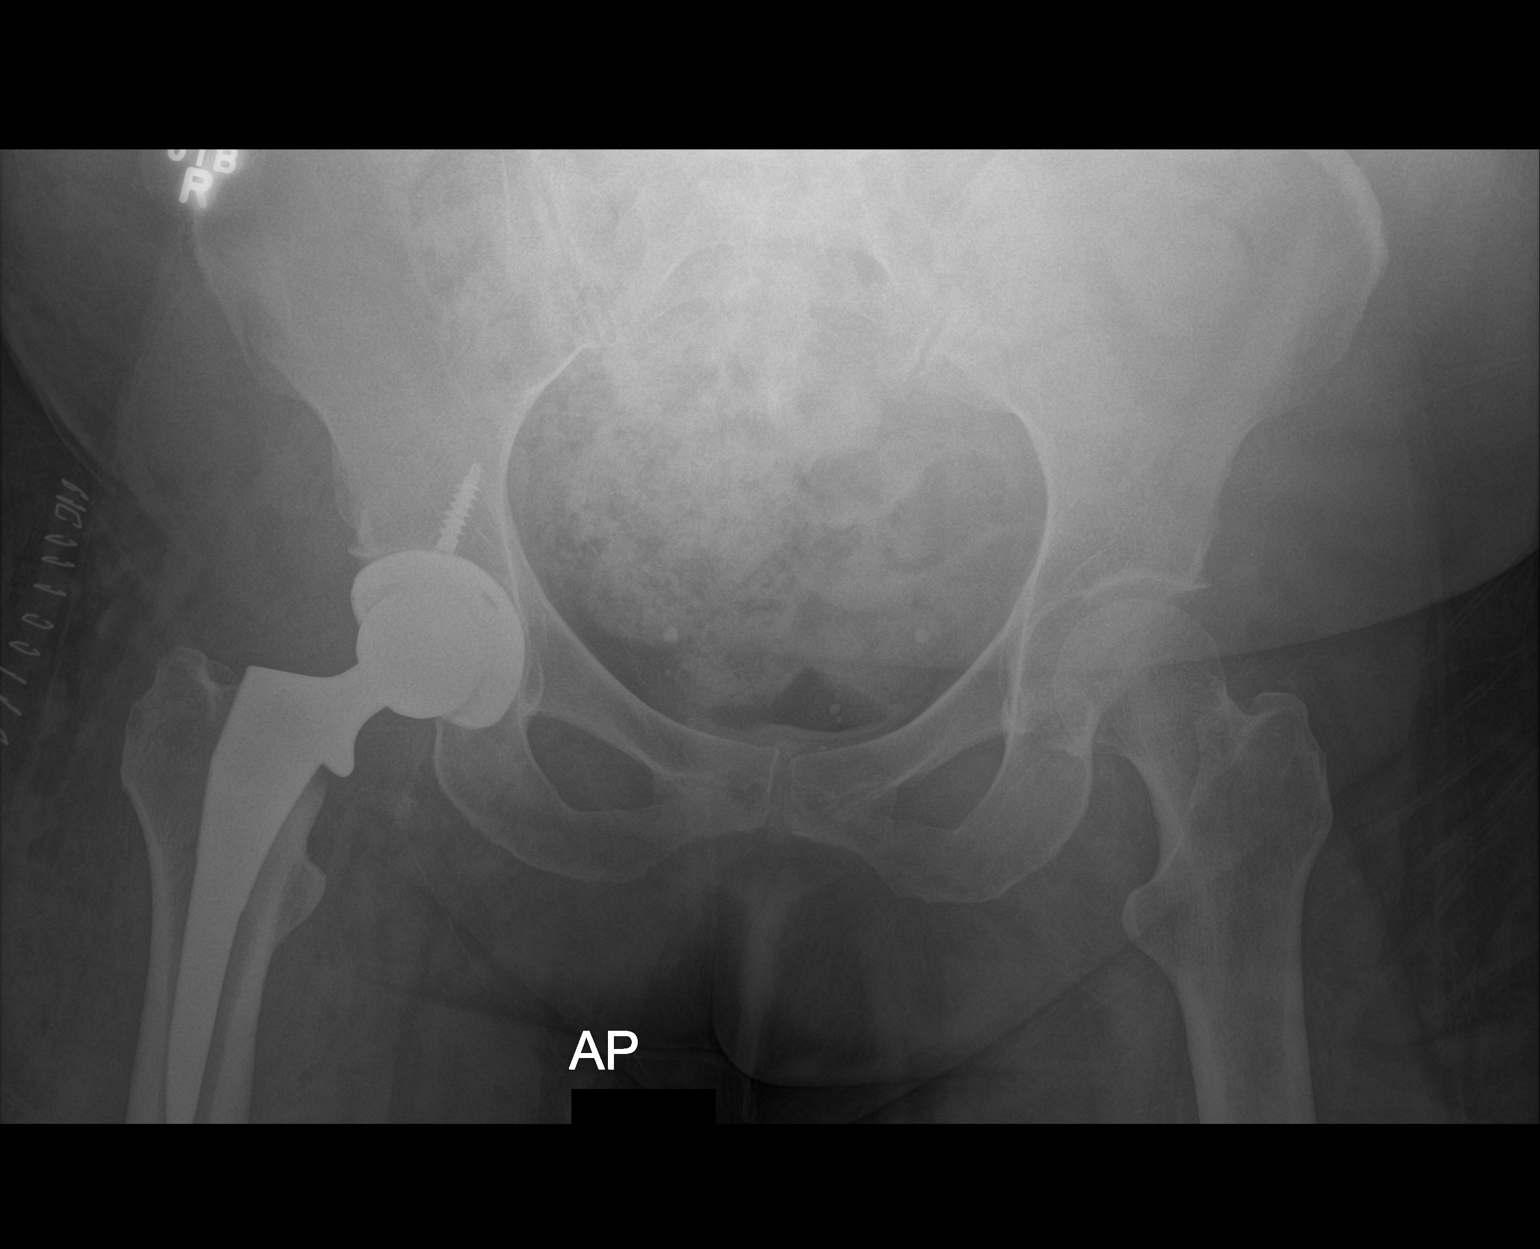

[1 of 1 positions shown; findings below may reference images not displayed]

FINDINGS: Status post right hip replacement with intact hardware and normal
alignment. Pubic symphysis and rami appear intact
IMPRESSION: Status post right hip replacement with expected postsurgical change

## 2020-11-19 IMAGING — RF DG HIP (WITH PELVIS) OPERATIVE*R*
1 series · 7 of 7 positions shown · non-contrast
Comparison: Right hip radiographs-[DATE]

CLINICAL DATA: Right total hip arthroplasty via anterior approach.

EXAM:
OPERATIVE RIGHT HIP (WITH PELVIS IF PERFORMED) 7 VIEWS
TECHNIQUE: Fluoroscopic spot image(s) were submitted for interpretation
post-operatively.
FLUOROSCOPY TIME:  19 seconds (2.9 mGy)

[Series 1: unknown protocol · 0.20mm/px · 7 of 7 slices shown]
[im 1/7]
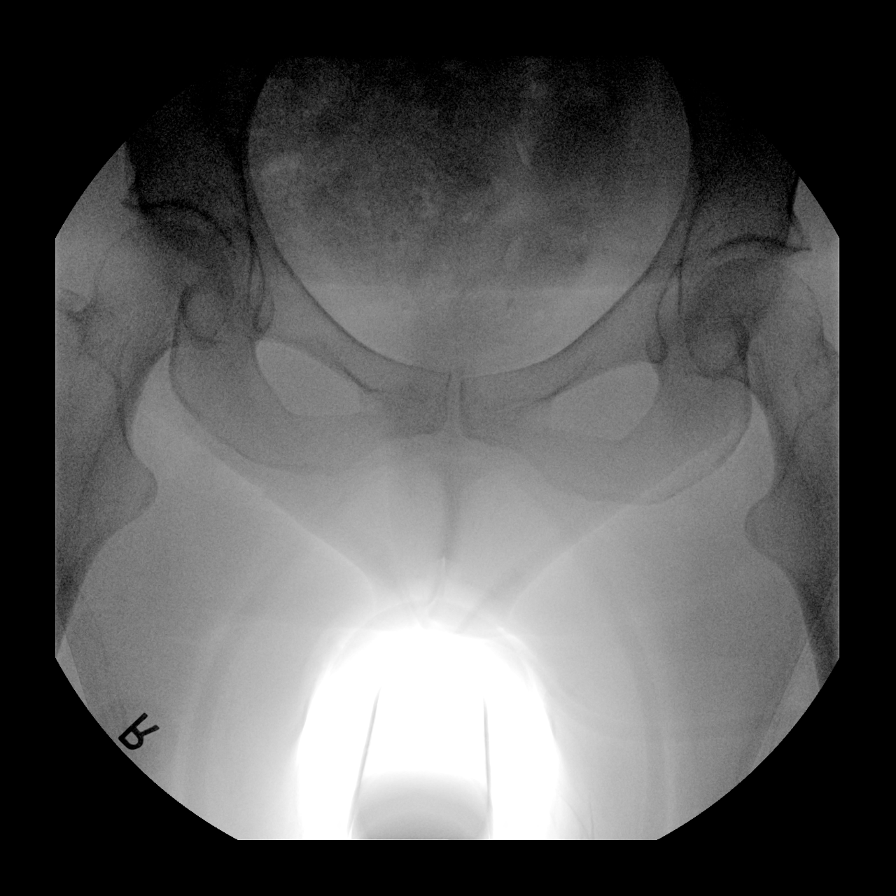
[im 2/7]
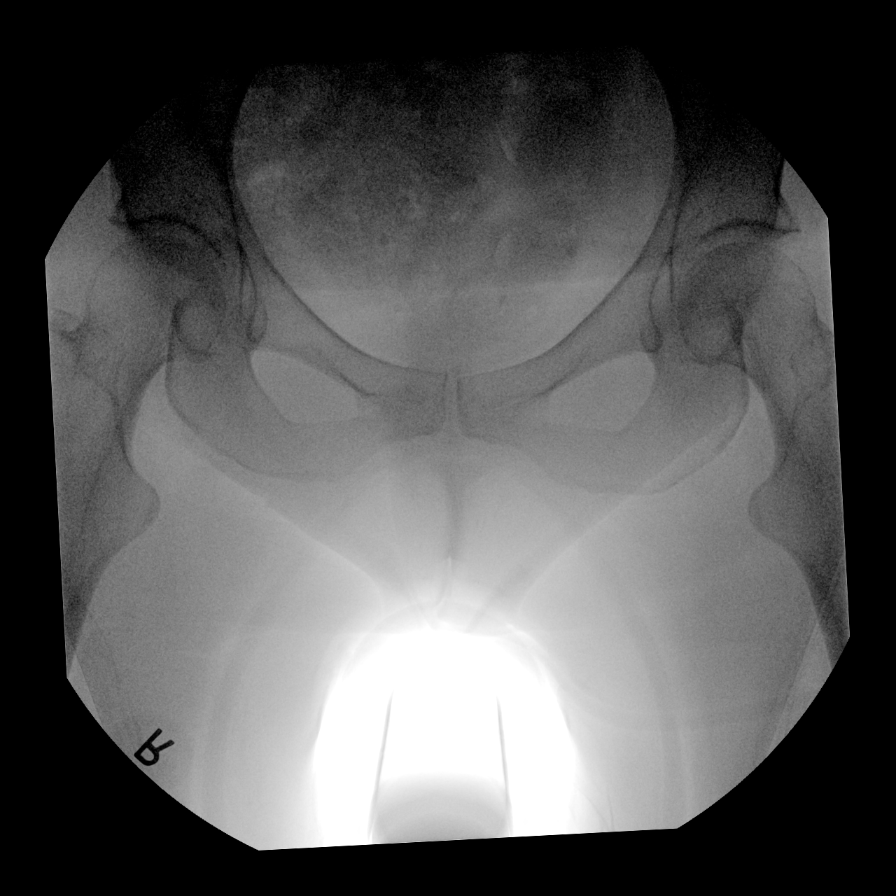
[im 3/7]
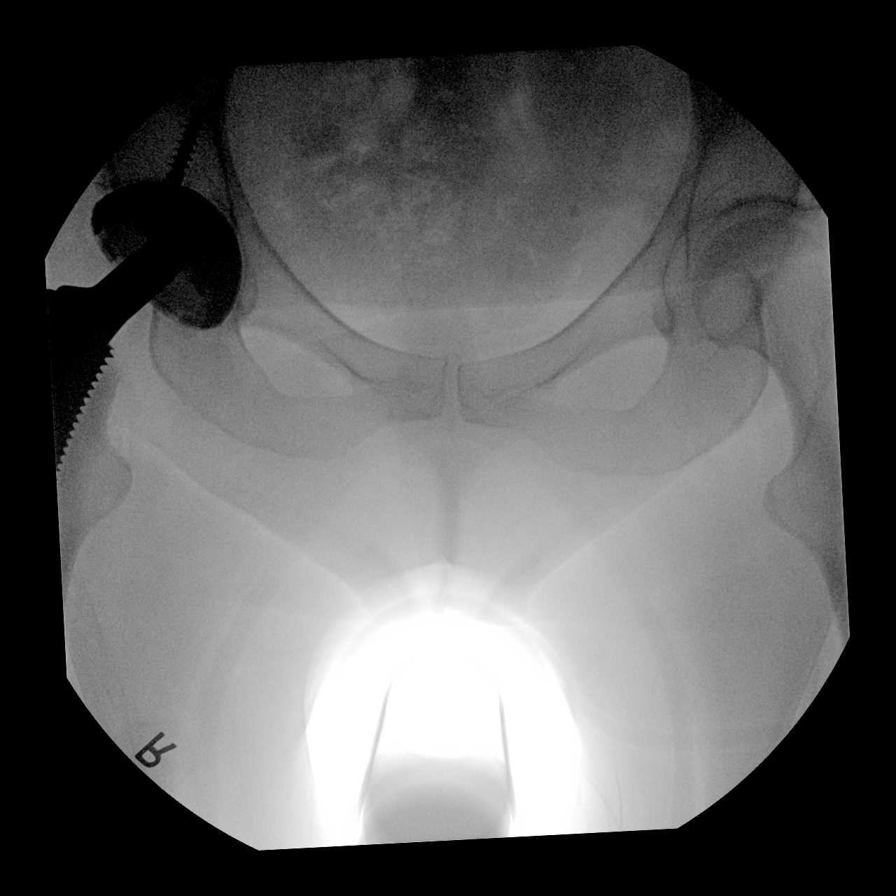
[im 4/7]
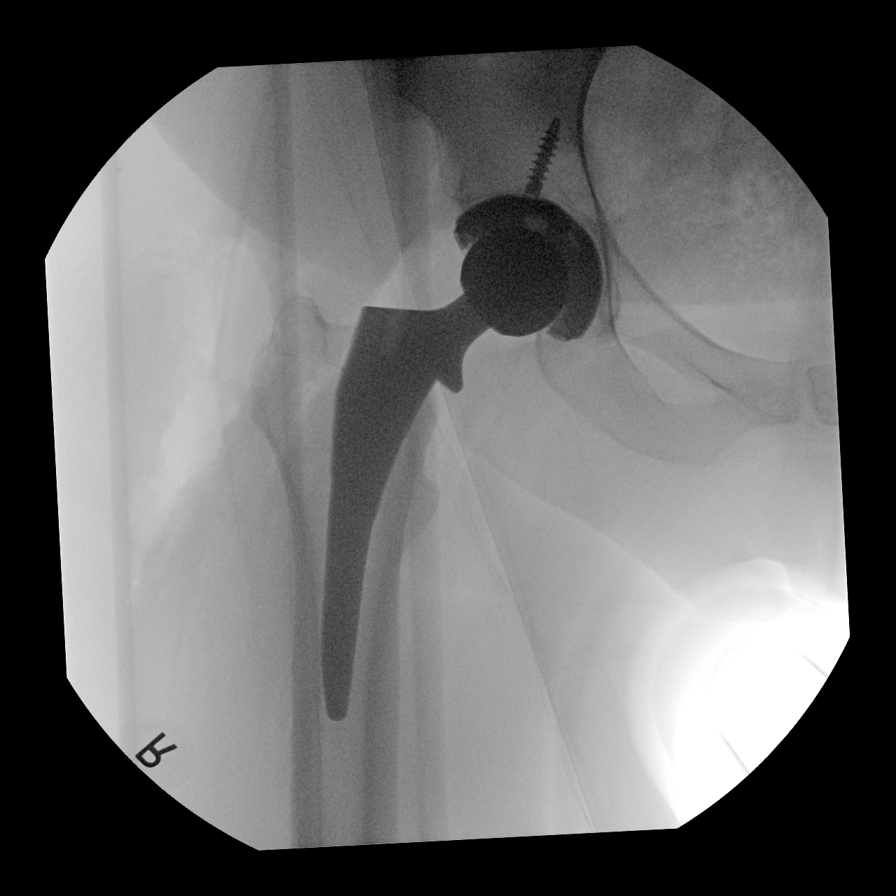
[im 5/7]
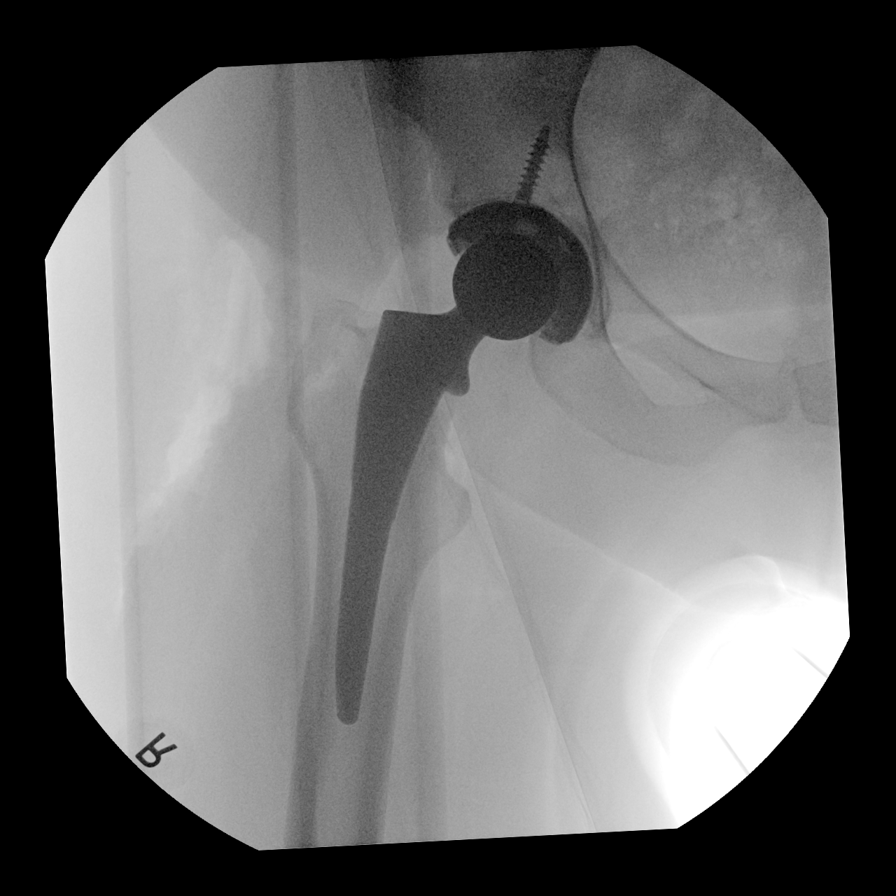
[im 6/7]
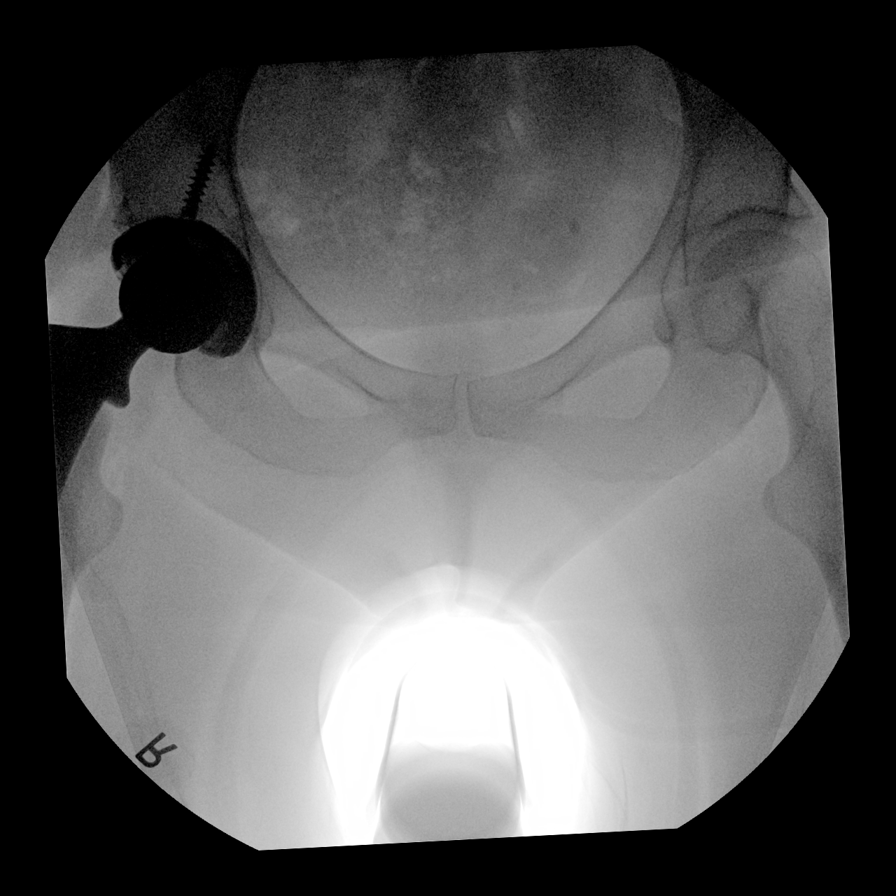
[im 7/7]
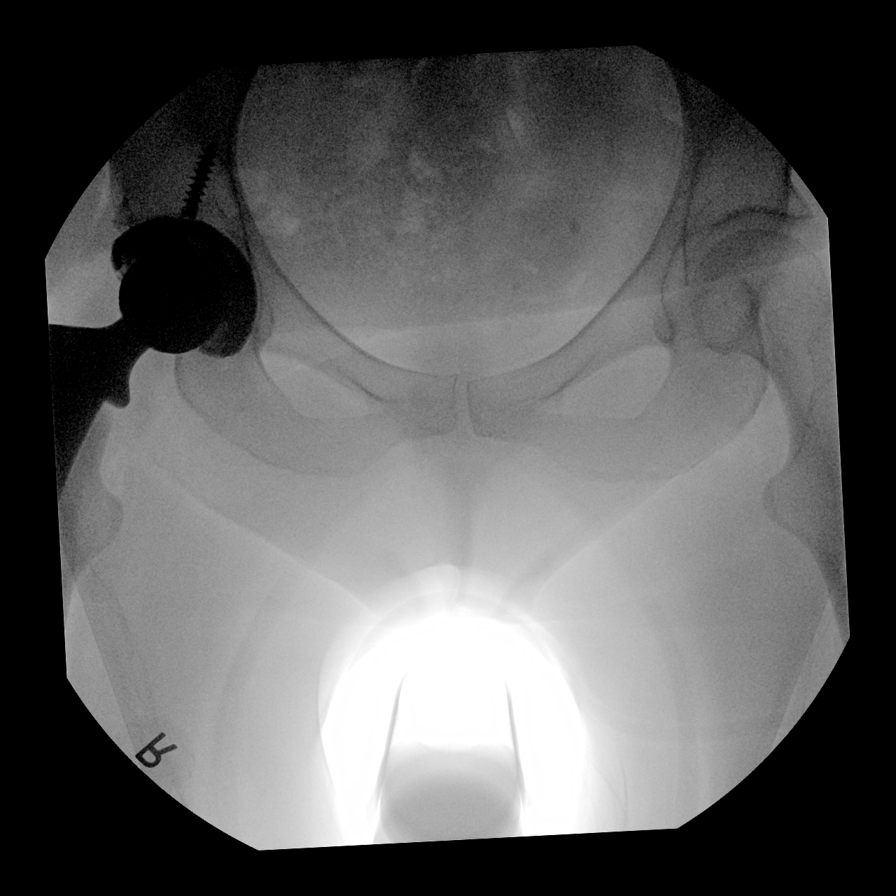

[7 of 7 positions shown; findings below may reference images not displayed]

FINDINGS: Seven spot intraoperative fluoroscopic images of the lower pelvis
and right hip are provided for review.

Images demonstrates sequela of right total hip replacement.
Alignment appears anatomic given anterior projection. There is a
minimal amount of expected subcutaneous emphysema about the
operative site. No radiopaque foreign body.

Limited visualization of the lower pelvis and contralateral left hip
is normal.
IMPRESSION: Post right total hip replacement without evidence of complication.

## 2020-11-19 SURGERY — ARTHROPLASTY, HIP, TOTAL, ANTERIOR APPROACH
Anesthesia: Spinal | Site: Hip | Laterality: Right

## 2020-11-19 MED ORDER — POLYETHYLENE GLYCOL 3350 17 G PO PACK: 17.0000 g | PACK | Freq: Every day | ORAL | Status: DC | PRN

## 2020-11-19 MED ORDER — ONDANSETRON HCL 4 MG/2ML IJ SOLN: 4.0000 mg | Freq: Four times a day (QID) | INTRAMUSCULAR | Status: DC | PRN

## 2020-11-19 MED ORDER — ONDANSETRON HCL 4 MG/2ML IJ SOLN
INTRAMUSCULAR | Status: AC
  Filled 2020-11-19: qty 2

## 2020-11-19 MED ORDER — FLUTICASONE PROPIONATE 50 MCG/ACT NA SUSP
2.0000 | Freq: Every day | NASAL | Status: DC
  Administered 2020-11-20 – 2020-11-21 (×2): 2 via NASAL
  Filled 2020-11-19: qty 16

## 2020-11-19 MED ORDER — METOCLOPRAMIDE HCL 5 MG PO TABS: 5.0000 mg | ORAL_TABLET | Freq: Three times a day (TID) | ORAL | Status: DC | PRN

## 2020-11-19 MED ORDER — DOCUSATE SODIUM 100 MG PO CAPS
100.0000 mg | ORAL_CAPSULE | Freq: Two times a day (BID) | ORAL | Status: DC
  Administered 2020-11-19 – 2020-11-21 (×4): 100 mg via ORAL
  Filled 2020-11-19 (×4): qty 1

## 2020-11-19 MED ORDER — METOCLOPRAMIDE HCL 5 MG/ML IJ SOLN: 5.0000 mg | Freq: Three times a day (TID) | INTRAMUSCULAR | Status: DC | PRN

## 2020-11-19 MED ORDER — PANTOPRAZOLE SODIUM 40 MG PO TBEC
40.0000 mg | DELAYED_RELEASE_TABLET | Freq: Every day | ORAL | Status: DC
  Administered 2020-11-20 – 2020-11-21 (×2): 40 mg via ORAL
  Filled 2020-11-19 (×2): qty 1

## 2020-11-19 MED ORDER — LIDOCAINE 2% (20 MG/ML) 5 ML SYRINGE
INTRAMUSCULAR | Status: DC | PRN
  Administered 2020-11-19: 40 mg via INTRAVENOUS

## 2020-11-19 MED ORDER — CLINDAMYCIN PHOSPHATE 600 MG/50ML IV SOLN
600.0000 mg | Freq: Four times a day (QID) | INTRAVENOUS | Status: AC
Start: 2020-11-19 — End: 2020-11-20
  Administered 2020-11-19 (×2): 600 mg via INTRAVENOUS
  Filled 2020-11-19 (×2): qty 50

## 2020-11-19 MED ORDER — GABAPENTIN 600 MG PO TABS
600.0000 mg | ORAL_TABLET | Freq: Three times a day (TID) | ORAL | Status: DC
  Administered 2020-11-19 – 2020-11-21 (×6): 600 mg via ORAL
  Filled 2020-11-19 (×6): qty 1

## 2020-11-19 MED ORDER — METFORMIN HCL 500 MG PO TABS
500.0000 mg | ORAL_TABLET | Freq: Two times a day (BID) | ORAL | Status: DC
  Administered 2020-11-19 – 2020-11-21 (×4): 500 mg via ORAL
  Filled 2020-11-19 (×4): qty 1

## 2020-11-19 MED ORDER — LACTATED RINGERS IV SOLN: INTRAVENOUS | Status: DC

## 2020-11-19 MED ORDER — MIDAZOLAM HCL 2 MG/2ML IJ SOLN
INTRAMUSCULAR | Status: AC
  Filled 2020-11-19: qty 2

## 2020-11-19 MED ORDER — HYDROMORPHONE HCL 1 MG/ML IJ SOLN
0.5000 mg | INTRAMUSCULAR | Status: DC | PRN
  Administered 2020-11-19 (×2): 1 mg via INTRAVENOUS
  Filled 2020-11-19 (×2): qty 1

## 2020-11-19 MED ORDER — ANASTROZOLE 1 MG PO TABS
1.0000 mg | ORAL_TABLET | Freq: Every day | ORAL | Status: DC
  Administered 2020-11-19 – 2020-11-20 (×2): 1 mg via ORAL
  Filled 2020-11-19 (×3): qty 1

## 2020-11-19 MED ORDER — METHOCARBAMOL 500 MG PO TABS
500.0000 mg | ORAL_TABLET | Freq: Four times a day (QID) | ORAL | Status: DC | PRN
  Administered 2020-11-19 – 2020-11-21 (×7): 500 mg via ORAL
  Filled 2020-11-19 (×7): qty 1

## 2020-11-19 MED ORDER — PHENYLEPHRINE 40 MCG/ML (10ML) SYRINGE FOR IV PUSH (FOR BLOOD PRESSURE SUPPORT)
PREFILLED_SYRINGE | INTRAVENOUS | Status: AC
  Filled 2020-11-19: qty 10

## 2020-11-19 MED ORDER — OXYCODONE HCL 5 MG PO TABS
ORAL_TABLET | ORAL | Status: AC
  Filled 2020-11-19: qty 2

## 2020-11-19 MED ORDER — ACETAMINOPHEN 10 MG/ML IV SOLN
1000.0000 mg | Freq: Once | INTRAVENOUS | Status: DC | PRN
  Administered 2020-11-19: 1000 mg via INTRAVENOUS

## 2020-11-19 MED ORDER — METHOCARBAMOL 500 MG PO TABS
ORAL_TABLET | ORAL | Status: AC
  Filled 2020-11-19: qty 1

## 2020-11-19 MED ORDER — ACETAMINOPHEN 325 MG PO TABS: 325.0000 mg | ORAL_TABLET | Freq: Once | ORAL | Status: DC | PRN

## 2020-11-19 MED ORDER — PHENYLEPHRINE HCL-NACL 20-0.9 MG/250ML-% IV SOLN
INTRAVENOUS | Status: DC | PRN
  Administered 2020-11-19: 25 ug/min via INTRAVENOUS

## 2020-11-19 MED ORDER — OXYCODONE HCL 5 MG PO TABS
10.0000 mg | ORAL_TABLET | ORAL | Status: DC | PRN
  Administered 2020-11-19 – 2020-11-20 (×4): 10 mg via ORAL
  Filled 2020-11-19 (×5): qty 2

## 2020-11-19 MED ORDER — 0.9 % SODIUM CHLORIDE (POUR BTL) OPTIME
TOPICAL | Status: DC | PRN
  Administered 2020-11-19: 1000 mL

## 2020-11-19 MED ORDER — PHENOL 1.4 % MT LIQD: 1.0000 | OROMUCOSAL | Status: DC | PRN

## 2020-11-19 MED ORDER — AMISULPRIDE (ANTIEMETIC) 5 MG/2ML IV SOLN: 10.0000 mg | Freq: Once | INTRAVENOUS | Status: DC | PRN

## 2020-11-19 MED ORDER — DULOXETINE HCL 30 MG PO CPEP
30.0000 mg | ORAL_CAPSULE | Freq: Every day | ORAL | Status: DC
  Administered 2020-11-20 – 2020-11-21 (×2): 30 mg via ORAL
  Filled 2020-11-19 (×2): qty 1

## 2020-11-19 MED ORDER — ASCORBIC ACID 500 MG PO TABS
500.0000 mg | ORAL_TABLET | Freq: Two times a day (BID) | ORAL | Status: DC
  Administered 2020-11-20 – 2020-11-21 (×3): 500 mg via ORAL
  Filled 2020-11-19 (×3): qty 1

## 2020-11-19 MED ORDER — CLINDAMYCIN PHOSPHATE 900 MG/50ML IV SOLN
900.0000 mg | INTRAVENOUS | Status: AC
  Administered 2020-11-19: 900 mg via INTRAVENOUS
  Filled 2020-11-19: qty 50

## 2020-11-19 MED ORDER — IPRATROPIUM BROMIDE 0.03 % NA SOLN
2.0000 | Freq: Two times a day (BID) | NASAL | Status: DC
  Filled 2020-11-19: qty 30

## 2020-11-19 MED ORDER — ACETAMINOPHEN 160 MG/5ML PO SOLN: 325.0000 mg | Freq: Once | ORAL | Status: DC | PRN

## 2020-11-19 MED ORDER — OXYCODONE HCL 5 MG PO TABS
5.0000 mg | ORAL_TABLET | ORAL | Status: DC | PRN
Start: 2020-11-19 — End: 2020-11-21
  Administered 2020-11-19 (×2): 10 mg via ORAL
  Administered 2020-11-20: 5 mg via ORAL
  Administered 2020-11-20 – 2020-11-21 (×3): 10 mg via ORAL
  Administered 2020-11-21: 5 mg via ORAL
  Filled 2020-11-19 (×5): qty 2

## 2020-11-19 MED ORDER — MIDAZOLAM HCL 2 MG/2ML IJ SOLN
INTRAMUSCULAR | Status: DC | PRN
  Administered 2020-11-19 (×2): 1 mg via INTRAVENOUS

## 2020-11-19 MED ORDER — POVIDONE-IODINE 10 % EX SWAB: 2.0000 "application " | Freq: Once | CUTANEOUS | Status: DC

## 2020-11-19 MED ORDER — AMLODIPINE BESYLATE 5 MG PO TABS
5.0000 mg | ORAL_TABLET | Freq: Every day | ORAL | Status: DC
  Administered 2020-11-20 – 2020-11-21 (×2): 5 mg via ORAL
  Filled 2020-11-19 (×2): qty 1

## 2020-11-19 MED ORDER — METHOCARBAMOL 1000 MG/10ML IJ SOLN
500.0000 mg | Freq: Four times a day (QID) | INTRAVENOUS | Status: DC | PRN
  Filled 2020-11-19: qty 5

## 2020-11-19 MED ORDER — METFORMIN HCL 500 MG PO TABS
500.0000 mg | ORAL_TABLET | ORAL | Status: AC
  Administered 2020-11-19: 500 mg via ORAL

## 2020-11-19 MED ORDER — TRANEXAMIC ACID-NACL 1000-0.7 MG/100ML-% IV SOLN
1000.0000 mg | INTRAVENOUS | Status: AC
  Administered 2020-11-19: 1000 mg via INTRAVENOUS
  Filled 2020-11-19: qty 100

## 2020-11-19 MED ORDER — CHLORHEXIDINE GLUCONATE 0.12 % MT SOLN
15.0000 mL | Freq: Once | OROMUCOSAL | Status: AC
  Administered 2020-11-19: 15 mL via OROMUCOSAL
  Filled 2020-11-19: qty 15

## 2020-11-19 MED ORDER — LEVOTHYROXINE SODIUM 25 MCG PO TABS
50.0000 ug | ORAL_TABLET | Freq: Every day | ORAL | Status: DC
  Administered 2020-11-20 – 2020-11-21 (×2): 50 ug via ORAL
  Filled 2020-11-19 (×2): qty 2

## 2020-11-19 MED ORDER — HYDROMORPHONE HCL 1 MG/ML IJ SOLN
0.2500 mg | INTRAMUSCULAR | Status: DC | PRN
  Administered 2020-11-19 (×2): 0.5 mg via INTRAVENOUS
  Administered 2020-11-19: 0.25 mg via INTRAVENOUS

## 2020-11-19 MED ORDER — DULOXETINE HCL 30 MG PO CPEP
60.0000 mg | ORAL_CAPSULE | Freq: Every day | ORAL | Status: DC
  Administered 2020-11-20 – 2020-11-21 (×2): 60 mg via ORAL
  Filled 2020-11-19 (×2): qty 2

## 2020-11-19 MED ORDER — DIPHENHYDRAMINE HCL 12.5 MG/5ML PO ELIX
12.5000 mg | ORAL_SOLUTION | ORAL | Status: DC | PRN
  Filled 2020-11-19: qty 10

## 2020-11-19 MED ORDER — ACETAMINOPHEN 325 MG PO TABS
325.0000 mg | ORAL_TABLET | Freq: Four times a day (QID) | ORAL | Status: DC | PRN
  Administered 2020-11-19 – 2020-11-21 (×5): 650 mg via ORAL
  Filled 2020-11-19 (×6): qty 2

## 2020-11-19 MED ORDER — ONDANSETRON HCL 4 MG PO TABS: 4.0000 mg | ORAL_TABLET | Freq: Four times a day (QID) | ORAL | Status: DC | PRN

## 2020-11-19 MED ORDER — PROMETHAZINE HCL 25 MG/ML IJ SOLN: 6.2500 mg | INTRAMUSCULAR | Status: DC | PRN

## 2020-11-19 MED ORDER — ONDANSETRON HCL 4 MG/2ML IJ SOLN
INTRAMUSCULAR | Status: DC | PRN
  Administered 2020-11-19: 4 mg via INTRAVENOUS

## 2020-11-19 MED ORDER — BUPIVACAINE IN DEXTROSE 0.75-8.25 % IT SOLN
INTRATHECAL | Status: DC | PRN
  Administered 2020-11-19: 2 mL via INTRATHECAL

## 2020-11-19 MED ORDER — ALUM & MAG HYDROXIDE-SIMETH 200-200-20 MG/5ML PO SUSP: 30.0000 mL | ORAL | Status: DC | PRN

## 2020-11-19 MED ORDER — VALACYCLOVIR HCL 500 MG PO TABS
500.0000 mg | ORAL_TABLET | Freq: Every day | ORAL | Status: DC
  Administered 2020-11-20 – 2020-11-21 (×2): 500 mg via ORAL
  Filled 2020-11-19 (×2): qty 1

## 2020-11-19 MED ORDER — APIXABAN 5 MG PO TABS
5.0000 mg | ORAL_TABLET | Freq: Two times a day (BID) | ORAL | Status: DC
  Administered 2020-11-20 – 2020-11-21 (×3): 5 mg via ORAL
  Filled 2020-11-19 (×4): qty 1

## 2020-11-19 MED ORDER — ACETAMINOPHEN 10 MG/ML IV SOLN
INTRAVENOUS | Status: AC
  Filled 2020-11-19: qty 100

## 2020-11-19 MED ORDER — IPRATROPIUM BROMIDE 0.06 % NA SOLN
2.0000 | Freq: Two times a day (BID) | NASAL | Status: DC
  Filled 2020-11-19: qty 15

## 2020-11-19 MED ORDER — HYDROMORPHONE HCL 1 MG/ML IJ SOLN
INTRAMUSCULAR | Status: AC
  Filled 2020-11-19: qty 1

## 2020-11-19 MED ORDER — PROPOFOL 500 MG/50ML IV EMUL
INTRAVENOUS | Status: DC | PRN
  Administered 2020-11-19: 75 ug/kg/min via INTRAVENOUS

## 2020-11-19 MED ORDER — ORAL CARE MOUTH RINSE: 15.0000 mL | Freq: Once | OROMUCOSAL | Status: AC

## 2020-11-19 MED ORDER — SODIUM CHLORIDE 0.9 % IV SOLN: INTRAVENOUS | Status: DC

## 2020-11-19 MED ORDER — MENTHOL 3 MG MT LOZG: 1.0000 | LOZENGE | OROMUCOSAL | Status: DC | PRN

## 2020-11-19 MED ORDER — VITAMIN D 25 MCG (1000 UNIT) PO TABS
1000.0000 [IU] | ORAL_TABLET | Freq: Every day | ORAL | Status: DC
  Administered 2020-11-20 – 2020-11-21 (×2): 1000 [IU] via ORAL
  Filled 2020-11-19 (×2): qty 1

## 2020-11-19 MED ORDER — PHENYLEPHRINE 40 MCG/ML (10ML) SYRINGE FOR IV PUSH (FOR BLOOD PRESSURE SUPPORT)
PREFILLED_SYRINGE | INTRAVENOUS | Status: DC | PRN
  Administered 2020-11-19: 120 ug via INTRAVENOUS
  Administered 2020-11-19: 80 ug via INTRAVENOUS

## 2020-11-19 MED ORDER — SODIUM CHLORIDE 0.9 % IR SOLN
Status: DC | PRN
  Administered 2020-11-19: 3000 mL

## 2020-11-19 MED ORDER — MEPERIDINE HCL 25 MG/ML IJ SOLN: 6.2500 mg | INTRAMUSCULAR | Status: DC | PRN

## 2020-11-19 MED ORDER — ALBUTEROL SULFATE (2.5 MG/3ML) 0.083% IN NEBU: 3.0000 mL | INHALATION_SOLUTION | Freq: Four times a day (QID) | RESPIRATORY_TRACT | Status: DC | PRN

## 2020-11-19 SURGICAL SUPPLY — 54 items
BAG COUNTER SPONGE SURGICOUNT (BAG) ×2 IMPLANT
BALL HIP CERAMIC (Hips) ×1 IMPLANT
BENZOIN TINCTURE PRP APPL 2/3 (GAUZE/BANDAGES/DRESSINGS) ×2 IMPLANT
BLADE CLIPPER SURG (BLADE) IMPLANT
BLADE SAW SGTL 18X1.27X75 (BLADE) ×2 IMPLANT
COVER SURGICAL LIGHT HANDLE (MISCELLANEOUS) ×2 IMPLANT
CUP SECTOR GRIPTON 50MM (Cup) ×2 IMPLANT
DRAPE C-ARM 42X72 X-RAY (DRAPES) ×2 IMPLANT
DRAPE STERI IOBAN 125X83 (DRAPES) ×2 IMPLANT
DRAPE U-SHAPE 47X51 STRL (DRAPES) ×6 IMPLANT
DRSG AQUACEL AG ADV 3.5X10 (GAUZE/BANDAGES/DRESSINGS) ×2 IMPLANT
DURAPREP 26ML APPLICATOR (WOUND CARE) ×2 IMPLANT
ELECT BLADE 4.0 EZ CLEAN MEGAD (MISCELLANEOUS) ×2
ELECT BLADE 6.5 EXT (BLADE) IMPLANT
ELECT REM PT RETURN 9FT ADLT (ELECTROSURGICAL) ×2
ELECTRODE BLDE 4.0 EZ CLN MEGD (MISCELLANEOUS) ×1 IMPLANT
ELECTRODE REM PT RTRN 9FT ADLT (ELECTROSURGICAL) ×1 IMPLANT
FACESHIELD WRAPAROUND (MASK) ×4 IMPLANT
GLOVE SRG 8 PF TXTR STRL LF DI (GLOVE) ×2 IMPLANT
GLOVE SURG LTX SZ8 (GLOVE) ×2 IMPLANT
GLOVE SURG ORTHO LTX SZ7.5 (GLOVE) ×4 IMPLANT
GLOVE SURG UNDER POLY LF SZ8 (GLOVE) ×2
GOWN STRL REUS W/ TWL LRG LVL3 (GOWN DISPOSABLE) ×2 IMPLANT
GOWN STRL REUS W/ TWL XL LVL3 (GOWN DISPOSABLE) ×2 IMPLANT
GOWN STRL REUS W/TWL LRG LVL3 (GOWN DISPOSABLE) ×2
GOWN STRL REUS W/TWL XL LVL3 (GOWN DISPOSABLE) ×2
HANDPIECE INTERPULSE COAX TIP (DISPOSABLE) ×1
HIP BALL CERAMIC (Hips) ×2 IMPLANT
KIT BASIN OR (CUSTOM PROCEDURE TRAY) ×2 IMPLANT
KIT TURNOVER KIT B (KITS) ×2 IMPLANT
LINER ACETABULAR 32X50 (Liner) ×2 IMPLANT
MANIFOLD NEPTUNE II (INSTRUMENTS) ×2 IMPLANT
NS IRRIG 1000ML POUR BTL (IV SOLUTION) ×2 IMPLANT
PACK TOTAL JOINT (CUSTOM PROCEDURE TRAY) ×2 IMPLANT
PAD ARMBOARD 7.5X6 YLW CONV (MISCELLANEOUS) ×2 IMPLANT
SCREW 6.5MMX25MM (Screw) ×2 IMPLANT
SET HNDPC FAN SPRY TIP SCT (DISPOSABLE) ×1 IMPLANT
STAPLER VISISTAT 35W (STAPLE) IMPLANT
STEM FEM ACTIS HIGH SZ3 (Stem) ×2 IMPLANT
STRIP CLOSURE SKIN 1/2X4 (GAUZE/BANDAGES/DRESSINGS) ×4 IMPLANT
SUT ETHIBOND NAB CT1 #1 30IN (SUTURE) ×2 IMPLANT
SUT MNCRL AB 4-0 PS2 18 (SUTURE) IMPLANT
SUT VIC AB 0 CT1 27 (SUTURE) ×1
SUT VIC AB 0 CT1 27XBRD ANBCTR (SUTURE) ×1 IMPLANT
SUT VIC AB 1 CT1 27 (SUTURE) ×1
SUT VIC AB 1 CT1 27XBRD ANBCTR (SUTURE) ×1 IMPLANT
SUT VIC AB 2-0 CT1 27 (SUTURE) ×1
SUT VIC AB 2-0 CT1 TAPERPNT 27 (SUTURE) ×1 IMPLANT
TOWEL GREEN STERILE (TOWEL DISPOSABLE) ×2 IMPLANT
TOWEL GREEN STERILE FF (TOWEL DISPOSABLE) ×2 IMPLANT
TRAY CATH 16FR W/PLASTIC CATH (SET/KITS/TRAYS/PACK) IMPLANT
TRAY FOLEY W/BAG SLVR 16FR (SET/KITS/TRAYS/PACK)
TRAY FOLEY W/BAG SLVR 16FR ST (SET/KITS/TRAYS/PACK) IMPLANT
WATER STERILE IRR 1000ML POUR (IV SOLUTION) ×4 IMPLANT

## 2020-11-19 NOTE — Anesthesia Postprocedure Evaluation (Signed)
Anesthesia Post Note  Patient: Sierra Perez  Procedure(s) Performed: RIGHT TOTAL HIP ARTHROPLASTY ANTERIOR APPROACH (Right: Hip)     Patient location during evaluation: PACU Anesthesia Type: Spinal Level of consciousness: awake Pain management: pain level controlled Vital Signs Assessment: post-procedure vital signs reviewed and stable Respiratory status: spontaneous breathing Cardiovascular status: stable Postop Assessment: no apparent nausea or vomiting Anesthetic complications: no   No notable events documented.  Last Vitals:  Vitals:   11/19/20 1350 11/19/20 1405  BP: 90/70 110/62  Pulse: 80 70  Resp: 13 17  Temp: (!) 36.3 C   SpO2: 100% 99%    Last Pain:  Vitals:   11/19/20 1350  TempSrc:   PainSc: 0-No pain                 Wessie Shanks

## 2020-11-19 NOTE — Anesthesia Procedure Notes (Signed)
Spinal  Start time: 11/19/2020 12:11 PM End time: 11/19/2020 12:13 PM Reason for block: surgical anesthesia Staffing Performed: anesthesiologist  Anesthesiologist: Effie Berkshire, MD Preanesthetic Checklist Completed: patient identified, IV checked, site marked, risks and benefits discussed, surgical consent, monitors and equipment checked, pre-op evaluation and timeout performed Spinal Block Patient position: sitting Prep: DuraPrep and site prepped and draped Location: L3-4 Injection technique: single-shot Needle Needle type: Pencan  Needle gauge: 24 G Needle length: 10 cm Needle insertion depth: 10 cm Additional Notes Patient tolerated well. No immediate complications.

## 2020-11-19 NOTE — Brief Op Note (Signed)
11/19/2020  1:32 PM  PATIENT:  Sierra Perez  60 y.o. female  PRE-OPERATIVE DIAGNOSIS:  right hip osteoarthritis  POST-OPERATIVE DIAGNOSIS:  right hip osteoarthritis  PROCEDURE:  Procedure(s): RIGHT TOTAL HIP ARTHROPLASTY ANTERIOR APPROACH (Right)  SURGEON:  Surgeon(s) and Role:    Mcarthur Rossetti, MD - Primary  PHYSICIAN ASSISTANT: Benita Stabile, PA-C  ANESTHESIA:   spinal  EBL:  350 mL   COUNTS:  YES  DICTATION: .Other Dictation: Dictation Number [id number]  PLAN OF CARE: Admit for overnight observation  PATIENT DISPOSITION:  PACU - hemodynamically stable.   Delay start of Pharmacological VTE agent (>24hrs) due to surgical blood loss or risk of bleeding: no

## 2020-11-19 NOTE — Op Note (Signed)
Sierra, Perez MEDICAL RECORD NO: [id number] ACCOUNT NO: [id number] DATE OF BIRTH: 11-Apr-1960 FACILITY: [facility] LOCATION: [facility] PHYSICIAN: Lind Guest. Ninfa Linden, MD  Operative Report   DATE OF PROCEDURE: 11/19/2020  PREOPERATIVE DIAGNOSIS:  Primary osteoarthritis and degenerative joint disease, right hip.  POSTOPERATIVE DIAGNOSIS:  Primary osteoarthritis and degenerative joint disease, right hip.  PROCEDURE:  Right total hip arthroplasty, direct anterior approach.  IMPLANTS:  DePuy sector Gription acetabular component size 50 and a single screw, size 32+0 neutral polyethylene liner, size 3 ACTIS femoral component with high offset, size 32+5 ceramic hip ball.  SURGEON:  Lind Guest. Ninfa Linden, MD  ASSISTANT:  Erskine Emery, PA-C.  ANESTHESIA:  Spinal.  ANTIBIOTICS:  900 mg of IV clindamycin.  ESTIMATED BLOOD LOSS:  704 mL  COMPLICATIONS:  None.  INDICATIONS:  The patient is a 60 year old female who appears much younger than her stated age.  She is morbidly obese and has significant arthritis involving her right hip that we have followed for over a year now.  MRI also confirmed the significance  of arthritis.  At this point, her right hip pain is daily and is detrimentally affecting her mobility, her quality of life and activities of daily living to the point that she does wish to proceed with total hip arthroplasty on the right side.  She has  also tried and failed all forms of conservative treatment is a very active individual.  Unfortunately, she also has arthritis in her right knee.  She has had a history of a left knee replacement.  Earlier this year, she did have a mastectomy to treat  breast cancer.  We had a long and thorough discussion about hip replacement surgery.  We talked about the risk of acute blood loss anemia, nerve or vessel injury, fracture, infection, dislocation, DVT and implant failure, skin and soft tissue issues and  leg length differences.  We talked  about our goals being decreased pain, improve mobility and overall improve quality of life.  DESCRIPTION OF PROCEDURE:  After informed consent was obtained, appropriate right hip was marked.  She was brought to the operating room and sat up on the stretcher where spinal anesthesia was obtained.  She was laid in the supine position on the  stretcher.  Foley catheter was placed and traction boots were placed on both her feet.  Next, she was placed supine on the Hana fracture table, the perineal post in place and both legs in line skeletal traction device and no traction applied.  Her right  operative hip was prepped and draped with DuraPrep and sterile drapes.  A timeout was called.  She was identified correct patient, correct right hip.  We then made an incision just inferior and posterior to the anterior superior iliac spine and carried  this slightly obliquely down the leg.  We dissected down tensor fascia lata muscle.  Tensor fascia was then divided longitudinally to proceed with direct anterior approach to the hip.  We identified and cauterized circumflex vessels and identified the  hip capsule, opened the hip capsule in L-type format finding moderate joint effusion and significant osteoarthritis on the lateral femoral head and neck.  Of note, she is someone who is on Eliquis and had to be bridged with Lovenox, so she definitely did  a lot of oozing during the case in terms of bleeding.  We placed Cobra retractors around the medial and lateral femoral neck and made a femoral neck cut with oscillating saw, proximal to the lesser trochanter and completed  this with an osteotome.  I  placed a corkscrew guide in the femoral head and removed the femoral head its entirety and found a wide area of significant cartilage thinning.  There was also degenerative tearing of the labrum.  I removed remnants of the acetabular labrum and other  debris and then placed a bent Hohmann over the medial acetabular rim.  I then  began reaming under direct visualization from a size 43 reamer in a stepwise increments going up to a size 49 with all reamers placed under direct visualization.  The last  reamer was also placed under direct fluoroscopy, so I could obtain my depth of reaming, my inclination and anteversion.  I then placed a real DePuy sector Gription acetabular component size 50 and a 25 screw.  I placed a 32+0 neutral polyethylene liner  for that size 50 acetabular component.  Attention was then turned to the femur.  With the leg externally rotated to 120 degrees, extended and adduct.  We are to place a Mueller retractor medially and Hohmann retractor behind the greater trochanter.  I  released lateral joint capsule and used a box cutting osteotome to enter the femoral canal and a rongeur to lateralize.  I then began broaching using the ACTIS broaching system from a size 0 going to a size 3.  With a size 3 in place, we trialed a  standard offset femoral neck a 32+1 hip ball, reduced this in acetabulum and she was definitely too short and needed more offset and was not stable.  We dislocated the hip, removed the trial components.  We then placed the real ACTIS femoral component,  size 3, but we went with a real high offset component.  Good to get back our length we went with a 32+5 ceramic hip ball, we reduced this in the acetabulum and it was very tight and we improved her leg length and offset and it was stable in internal and  external rotation, assessing this radiographically and clinically.  We then irrigated the soft tissue with normal saline solution using pulsatile lavage.  We closed the joint capsule with interrupted #1 Ethibond suture followed by #1 Vicryl to close the  tensor fascia.  0 Vicryl was used to close the deep tissue and 2-0 Vicryl was used to close subcutaneous tissue.  The skin was closed with staples.  An Aquacel dressing was applied.  She was taken off the Hana table and taken to recovery room in  stable  condition with all final counts being correct.  No complications noted.  Of note, Benita Stabile, PA-C did assist during the entire case.  His assistance was crucial for facilitating all aspects of this case.   PUS D: 11/19/2020 1:31:07 pm T: 11/19/2020 1:54:00 pm  JOB: [id number]/ [id number]

## 2020-11-19 NOTE — Transfer of Care (Signed)
Immediate Anesthesia Transfer of Care Note  Patient: Sierra Perez  Procedure(s) Performed: RIGHT TOTAL HIP ARTHROPLASTY ANTERIOR APPROACH (Right: Hip)  Patient Location: PACU  Anesthesia Type:Spinal  Level of Consciousness: awake and alert   Airway & Oxygen Therapy: Patient Spontanous Breathing and Patient connected to face mask oxygen  Post-op Assessment: Report given to RN and Post -op Vital signs reviewed and stable  Post vital signs: Reviewed and stable  Last Vitals:  Vitals Value Taken Time  BP 90/70 11/19/20 1351  Temp    Pulse 77 11/19/20 1354  Resp 13 11/19/20 1354  SpO2 99 % 11/19/20 1354  Vitals shown include unvalidated device data.  Last Pain:  Vitals:   11/19/20 1030  TempSrc:   PainSc: 0-No pain      Patients Stated Pain Goal: 0 (78/24/23 5361)  Complications: No notable events documented.

## 2020-11-19 NOTE — Anesthesia Procedure Notes (Signed)
Procedure Name: MAC Date/Time: 11/19/2020 12:09 PM Performed by: Reece Agar, CRNA Pre-anesthesia Checklist: Patient identified, Emergency Drugs available, Suction available and Patient being monitored Patient Re-evaluated:Patient Re-evaluated prior to induction Oxygen Delivery Method: Simple face mask Preoxygenation: Pre-oxygenation with 100% oxygen

## 2020-11-19 NOTE — H&P (Signed)
TOTAL HIP ADMISSION H&P  Patient is admitted for right total hip arthroplasty.  Subjective:  Chief Complaint: right hip pain  HPI: Sierra Perez, 60 y.o. female, has a history of pain and functional disability in the right hip(s) due to arthritis and patient has failed non-surgical conservative treatments for greater than 12 weeks to include NSAID's and/or analgesics, corticosteriod injections, use of assistive devices, weight reduction as appropriate, and activity modification.  Onset of symptoms was gradual starting 2 years ago with gradually worsening course since that time.The patient noted no past surgery on the right hip(s).  Patient currently rates pain in the right hip at 10 out of 10 with activity. Patient has night pain, worsening of pain with activity and weight bearing, pain that interfers with activities of daily living, and pain with passive range of motion. Patient has evidence of subchondral sclerosis, periarticular osteophytes, and joint space narrowing by imaging studies. This condition presents safety issues increasing the risk of falls. There is no current active infection.  Patient Active Problem List   Diagnosis Date Noted  . Breast cancer ([place]) 07/08/2020  . 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  . H/O total hysterectomy 06/11/2020  . Genetic testing 05/27/2020  . Family history of breast cancer   . Family history of colon cancer   . Personal history of malignant neoplasm of breast   . Unilateral primary osteoarthritis, right knee 02/26/2020  . Unilateral primary osteoarthritis, 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  . 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 DVT (deep venous thrombosis) ([place]) 05/04/2018  . Malignant neoplasm of upper-outer quadrant of right breast in female, estrogen receptor negative ([place]) 05/03/2018   Past Medical History:  Diagnosis Date  . Anemia   . Anxiety   . Asthma   . Breast cancer ([place]) 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, her-2 positive, ER/PR negative  . Clotting disorder ([place])     testing was negative, h/o DVT while on treatment  . Complex regional pain syndrome i of right lower limb    RDS  . Diabetes mellitus without complication ([place])   . DVT (deep venous thrombosis) ([place])    started in 2015, multiple  . Family history of breast cancer   . Family history of colon cancer   . Fibroid   . GERD (gastroesophageal reflux disease)   . Heart murmur   . HPV in female   . 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 Hypertension   . Hypothyroidism   . IBS (irritable bowel syndrome)   . Mitral valve prolapse   . Personal history of malignant neoplasm of breast   . Thyroid disease     Past Surgical History:  Procedure Laterality Date  . ABDOMINAL HYSTERECTOMY  2010   fibroids, h/o abnormal pap smears  . AXILLARY SENTINEL NODE BIOPSY Right 07/08/2020   Procedure: RIGHT AXILLARY SENTINEL NODE BIOPSY;  Surgeon: Rolm Bookbinder, MD;  Location: [facility];  Service: General;  Laterality: Right;  . BREAST RECONSTRUCTION WITH PLACEMENT OF TISSUE EXPANDER AND FLEX HD (ACELLULAR HYDRATED DERMIS) Bilateral 07/08/2020   Procedure: BILATERAL BREAST RECONSTRUCTION WITH  FLEX HD (ACELLULAR HYDRATED DERMIS),DIRECT IMPLANT RECONSTRUCTION;  Surgeon: Cindra Presume, MD;  Location: [facility];  Service: Plastics;  Laterality: Bilateral;  . BREAST SURGERY Right 2015   right lumpectomy with reduction and lift  . COLONOSCOPY    . DENTAL SURGERY    . ESOPHAGOGASTRODUODENOSCOPY    . FOOT SURGERY     dislocated toe  . LAPAROSCOPIC GASTRIC  [place] RESECTION  2014  . LAPAROSCOPIC OOPHERECTOMY Bilateral 2016  . REPLACEMENT TOTAL KNEE Left 2018   also had 3 prior arthroscopies  . TOTAL MASTECTOMY Bilateral 07/08/2020   Procedure: BILATERAL TOTAL MASTECTOMY;  Surgeon: Rolm Bookbinder, MD;  Location: [facility];  Service: General;  Laterality: Bilateral;  . VENA CAVA FILTER PLACEMENT  2017    Current Facility-Administered Medications  Medication Dose Route Frequency Provider Last  Rate Last Admin  . clindamycin (CLEOCIN) IVPB 900 mg  900 mg Intravenous On Call to OR Pete Pelt, PA-C      . lactated ringers infusion   Intravenous Continuous Pervis Hocking, DO 10 mL/hr at 11/19/20 1032 New Bag at 11/19/20 1032  . povidone-iodine 10 % swab 2 application  2 application Topical Once Pete Pelt, PA-C      . tranexamic acid (CYKLOKAPRON) IVPB 1,000 mg  1,000 mg Intravenous To OR Pete Pelt, PA-C       Allergies  Allergen Reactions  . Succinylcholine Other (See Comments)    Muscle aching  18 years ago from 07/02/20  . Keflex [Cephalexin] Diarrhea and Nausea And Vomiting  . Sulfa Antibiotics Rash    And swelling generalized not in throat    Social History   Tobacco Use  . Smoking status: Never  . Smokeless tobacco: Never  Substance Use Topics  . Alcohol use: Yes    Comment: occ    Family History  Problem Relation Age of Onset  . Hypertension Mother   . Skin cancer Mother   . Diabetes Father   . Kidney disease Father   . Dementia Father   . Factor VIII deficiency Father   . Bladder Cancer Maternal Grandmother        dx in late 85s; smoker  . Stroke Maternal Grandmother   . Lung cancer Maternal Grandfather   . Colon cancer Maternal Grandfather 56  . Thyroid cancer Maternal Grandfather        dx in his 23s  . Breast cancer Paternal Grandmother        dx in 10s  . Prostate cancer Other        MGMs brother     Review of Systems  Musculoskeletal:  Positive for gait problem.  All other systems reviewed and are negative.  Objective:  Physical Exam Vitals reviewed.  Constitutional:      Appearance: Normal appearance.  HENT:     Head: Normocephalic and atraumatic.  Eyes:     Extraocular Movements: Extraocular movements intact.     Pupils: Pupils are equal, round, and reactive to light.  Cardiovascular:     Rate and Rhythm: Normal rate and regular rhythm.     Pulses: Normal pulses.  Pulmonary:     Effort: Pulmonary effort is  normal.     Breath sounds: Normal breath sounds.  Abdominal:     Palpations: Abdomen is soft.  Musculoskeletal:     Cervical back: Normal range of motion and neck supple.     Right hip: Tenderness and bony tenderness present. Decreased range of motion. Decreased strength.  Neurological:     Mental Status: She is alert and oriented to person, place, and time.  Psychiatric:        Behavior: Behavior normal.    Vital signs in last 24 hours: Temp:  [98.4 F (36.9 C)] 98.4 F (36.9 C) (10/18 1006) Pulse Rate:  [78] 78 (10/18 1006) Resp:  [18] 18 (10/18 1006) BP: (142)/(62) 142/62 (10/18 1006)  SpO2:  [99 %] 99 % (10/18 1006) Weight:  [106.6 kg] 106.6 kg (10/18 1006)  Labs:   Estimated body mass index is 35.73 kg/m as calculated from the following:   Height as of this encounter: 5' 8"  (1.727 m).   Weight as of this encounter: 106.6 kg.   Imaging Review Plain radiographs demonstrate severe degenerative joint disease of the right hip(s). The bone quality appears to be excellent for age and reported activity level.      Assessment/Plan:  End stage arthritis, right hip(s)  The patient history, physical examination, clinical judgement of the provider and imaging studies are consistent with end stage degenerative joint disease of the right hip(s) and total hip arthroplasty is deemed medically necessary. The treatment options including medical management, injection therapy, arthroscopy and arthroplasty were discussed at length. The risks and benefits of total hip arthroplasty were presented and reviewed. The risks due to aseptic loosening, infection, stiffness, dislocation/subluxation,  thromboembolic complications and other imponderables were discussed.  The patient acknowledged the explanation, agreed to proceed with the plan and consent was signed. Patient is being admitted for inpatient treatment for surgery, pain control, PT, OT, prophylactic antibiotics, VTE prophylaxis,  progressive ambulation and ADL's and discharge planning.The patient is planning to be discharged home with home health services

## 2020-11-19 NOTE — Evaluation (Signed)
Physical Therapy Evaluation Patient Details Name: Sierra Perez MRN: [id number] DOB: [birth year] Today's Date: 11/19/2020  History of Present Illness  Pt is 60 yo female s/p R THA anterior approach on 11/19/20.  Pt with hx including but not limited to breast CA, mastectomy, clotting disorder, DM, complex regional pain syndrome, DM, HTN, L TKA  Clinical Impression  Pt is s/p THA resulting in the deficits listed below (see PT Problem List). At baseline, pt is independent and lives alone.  She has a friend that will stay with her initially after surgery and has necessary DME.  At evaluation today, pt did well for POD #0 and ambulated 80' with min guard.  Reports pain at 7/10 but tolerated mobility, Expected to progress well.  Pt will benefit from skilled PT to increase their independence and safety with mobility to allow discharge to the venue listed below.         Recommendations for follow up therapy are one component of a multi-disciplinary discharge planning process, led by the attending physician.  Recommendations may be updated based on patient status, additional functional criteria and insurance authorization.  Follow Up Recommendations Follow surgeon's recommendation for DC plan and follow-up therapies;Supervision for mobility/OOB    Equipment Recommendations  None recommended by PT    Recommendations for Other Services       Precautions / Restrictions Precautions Precautions: None Restrictions Weight Bearing Restrictions: Yes RLE Weight Bearing: Weight bearing as tolerated      Mobility  Bed Mobility Overal bed mobility: Needs Assistance Bed Mobility: Supine to Sit;Sit to Supine     Supine to sit: Min assist Sit to supine: Min assist   General bed mobility comments: min A for R LE    Transfers Overall transfer level: Needs assistance Equipment used: Rolling walker (2 wheeled) Transfers: Sit to/from Stand Sit to Stand: Min guard         General transfer comment:  Cues for hand placement and R LE management  Ambulation/Gait Ambulation/Gait assistance: Min guard Gait Distance (Feet): 80 Feet Assistive device: Rolling walker (2 wheeled) Gait Pattern/deviations: Step-through pattern;Decreased stride length Gait velocity: decraesed   General Gait Details: Cues for R LE WBAT, sequencing, and RW use  Stairs            Wheelchair Mobility    Modified Rankin (Stroke Patients Only)       Balance Overall balance assessment: Needs assistance Sitting-balance support: No upper extremity supported Sitting balance-Leahy Scale: Normal     Standing balance support: No upper extremity supported;Bilateral upper extremity supported Standing balance-Leahy Scale: Fair Standing balance comment: RW to ambulate but could static stand without AD                             Pertinent Vitals/Pain Pain Assessment: 0-10 Pain Score: 7  Pain Location: R hip Pain Descriptors / Indicators: Discomfort;Sore Pain Intervention(s): Limited activity within patient's tolerance;Monitored during session;RN gave pain meds during session    [place] expects to be discharged to:: Private residence Living Arrangements: Alone Available Help at Discharge: Friend(s);Available 24 hours/day (Friend Aide plans to stay with her) Type of Home: House Home Access: Stairs to enter Entrance Stairs-Rails: Psychiatric nurse of Steps: 3 Home Layout: Multi-level;Able to live on main level with bedroom/bathroom Home Equipment: Gilford Rile - 2 wheels;Cane - single point;Grab bars - tub/shower      Prior Function Level of Independence: Independent  Comments: Works from [employer]        Extremity/Trunk Assessment   Upper Extremity Assessment Upper Extremity Assessment: Overall WFL for tasks assessed    Lower Extremity Assessment Lower Extremity Assessment: LLE deficits/detail;RLE deficits/detail RLE  Deficits / Details: Expected post op changes; ROM WFL; MMT: ankle 5/5, knee 3/5, hip 2/5 LLE Deficits / Details: ROM WFL; MMT 5/5    Cervical / Trunk Assessment Cervical / Trunk Assessment: Normal  Communication      Cognition Arousal/Alertness: Awake/alert Behavior During Therapy: WFL for tasks assessed/performed Overall Cognitive Status: Within Functional Limits for tasks assessed                                        General Comments      Exercises Total Joint Exercises Ankle Circles/Pumps: AROM;Both;10 reps;Supine Quad Sets: AROM;Both;5 reps;Supine Heel Slides: AAROM;Right;5 reps;Supine Long Arc Quad: AROM;Right;5 reps;Supine   Assessment/Plan    PT Assessment Patient needs continued PT services  PT Problem List Decreased strength;Decreased mobility;Decreased range of motion;Decreased activity tolerance;Decreased balance;Decreased knowledge of use of DME;Pain       PT Treatment Interventions DME instruction;Therapeutic activities;Modalities;Gait training;Therapeutic exercise;Patient/family education;Stair training;Balance training;Functional mobility training    PT Goals (Current goals can be found in the Care Plan section)  Acute Rehab PT Goals Patient Stated Goal: return home PT Goal Formulation: With patient Time For Goal Achievement: 12/03/20 Potential to Achieve Goals: Good    Frequency 7X/week   Barriers to discharge        Co-evaluation               AM-PAC PT "6 Clicks" Mobility  Outcome Measure Help needed turning from your back to your side while in a flat bed without using bedrails?: A Little Help needed moving from lying on your back to sitting on the side of a flat bed without using bedrails?: A Little Help needed moving to and from a bed to a chair (including a wheelchair)?: A Little Help needed standing up from a chair using your arms (e.g., wheelchair or bedside chair)?: A Little Help needed to walk in hospital room?: A  Little Help needed climbing 3-5 steps with a railing? : A Little 6 Click Score: 18    End of Session Equipment Utilized During Treatment: Gait belt Activity Tolerance: Patient tolerated treatment well Patient left: in bed;with call bell/phone within reach;with family/visitor present;with SCD's reapplied Nurse Communication: Mobility status PT Visit Diagnosis: Other abnormalities of gait and mobility (R26.89);Muscle weakness (generalized) (M62.81);Pain Pain - Right/Left: Right Pain - part of body: Hip    Time: 3491-7915 PT Time Calculation (min) (ACUTE ONLY): 31 min   Charges:   PT Evaluation $PT Eval Low Complexity: 1 Low PT Treatments $Gait Training: 8-22 mins        Abran Richard, PT Acute Rehab Services Pager [phone] [facility] [phone]   Karlton Lemon 11/19/2020, 4:44 PM

## 2020-11-19 NOTE — Progress Notes (Signed)
Set patient's home CPAP unit up and plugged it into red outlet in wall. Patient able to get on by herself. Told her to call if she needed anything.

## 2020-11-20 ENCOUNTER — Encounter (HOSPITAL_COMMUNITY): Payer: Self-pay | Admitting: Orthopaedic Surgery

## 2020-11-20 DIAGNOSIS — M1611 Unilateral primary osteoarthritis, right hip: Secondary | ICD-10-CM | POA: Diagnosis not present

## 2020-11-20 LAB — BASIC METABOLIC PANEL
Anion gap: 9 (ref 5–15)
BUN: 15 mg/dL (ref 6–20)
CO2: 24 mmol/L (ref 22–32)
Calcium: 8.5 mg/dL — ABNORMAL LOW (ref 8.9–10.3)
Chloride: 100 mmol/L (ref 98–111)
Creatinine, Ser: 0.87 mg/dL (ref 0.44–1.00)
GFR, Estimated: >60 mL/min (ref >60)
Glucose, Bld: 217 mg/dL — ABNORMAL HIGH (ref 70–99)
Potassium: 4.5 mmol/L (ref 3.5–5.1)
Sodium: 133 mmol/L — ABNORMAL LOW (ref 135–145)

## 2020-11-20 LAB — CBC
HCT: 24.8 % — ABNORMAL LOW (ref 36.0–46.0)
Hemoglobin: 7.7 g/dL — ABNORMAL LOW (ref 12.0–15.0)
MCH: 25.5 pg — ABNORMAL LOW (ref 26.0–34.0)
MCHC: 31 g/dL (ref 30.0–36.0)
MCV: 82.1 fL (ref 80.0–100.0)
Platelets: 204 10*3/uL (ref 150–400)
RBC: 3.02 MIL/uL — ABNORMAL LOW (ref 3.87–5.11)
RDW: 17.3 % — ABNORMAL HIGH (ref 11.5–15.5)
WBC: 9.5 10*3/uL (ref 4.0–10.5)
nRBC: 0 % (ref 0.0–0.2)

## 2020-11-20 LAB — GLUCOSE, CAPILLARY
Glucose-Capillary: 172 mg/dL — ABNORMAL HIGH (ref 70–99)
Glucose-Capillary: 130 mg/dL — ABNORMAL HIGH (ref 70–99)
Glucose-Capillary: 157 mg/dL — ABNORMAL HIGH (ref 70–99)
Glucose-Capillary: 190 mg/dL — ABNORMAL HIGH (ref 70–99)

## 2020-11-20 MED ORDER — METHOCARBAMOL 500 MG PO TABS
500.0000 mg | ORAL_TABLET | Freq: Four times a day (QID) | ORAL | 1 refills | Status: DC | PRN
Start: 2020-11-20 — End: 2021-01-23

## 2020-11-20 MED ORDER — OXYCODONE HCL 5 MG PO TABS: 5.0000 mg | ORAL_TABLET | ORAL | 0 refills | Status: DC | PRN

## 2020-11-20 MED ORDER — ENOXAPARIN SODIUM 40 MG/0.4ML IJ SOSY
40.0000 mg | PREFILLED_SYRINGE | Freq: Once | INTRAMUSCULAR | Status: AC
  Administered 2020-11-20: 40 mg via SUBCUTANEOUS
  Filled 2020-11-20: qty 0.4

## 2020-11-20 NOTE — Progress Notes (Addendum)
Physical Therapy Treatment Patient Details Name: Sierra Perez MRN: [id number] DOB: 1960/06/20 Today's Date: 11/20/2020   History of Present Illness Pt is 60 yo female s/p R THA anterior approach on 11/19/20.  Pt with hx including but not limited to breast CA, mastectomy, clotting disorder, DM, complex regional pain syndrome, DM, HTN, L TKA    PT Comments    Pt has progressed well towards her physical therapy goals; reporting fair pain control. Reviewed HEP for strengthening and ROM (written handout provided). Pt then ambulating 200 feet with a walker and negotiated 3 steps with railings to simulate home set up. Pt demonstrates RLE weakness and gait abnormalities; would benefit from follow up PT after discharge to address.     Recommendations for follow up therapy are one component of a multi-disciplinary discharge planning process, led by the attending physician.  Recommendations may be updated based on patient status, additional functional criteria and insurance authorization.  Follow Up Recommendations  Home Health PT;Supervision for mobility/OOB     Equipment Recommendations  None recommended by PT (pt has needed DME)    Recommendations for Other Services       Precautions / Restrictions Precautions Precautions: None Restrictions Weight Bearing Restrictions: No     Mobility  Bed Mobility Overal bed mobility: Needs Assistance Bed Mobility: Supine to Sit     Supine to sit: Min assist     General bed mobility comments: MinA for RLE and use of bed pad to scoot hips anteriorly. Educated pt on use of long sheet vs towel to loop around foot to progress on and off bed initially    Transfers Overall transfer level: Needs assistance Equipment used: Rolling walker (2 wheeled) Transfers: Sit to/from Stand Sit to Stand: Supervision         General transfer comment: increased time to rise from low surface, no physical assist required  Ambulation/Gait Ambulation/Gait  assistance: Supervision Gait Distance (Feet): 200 Feet Assistive device: Rolling walker (2 wheeled) Gait Pattern/deviations: Step-through pattern;Decreased stride length;Step-to pattern;Decreased step length - left;Antalgic     General Gait Details: Pt with difficulty advancing RLE first few steps, but demonstrated improved foot clearance with distance. Utilizing step to vs step through pattern, cues provided for decreased right step length, equal step lengths, R heel strike   Stairs Stairs: Yes Stairs assistance: Min guard Stair Management: Two rails Number of Stairs: 3 General stair comments: cues for sequencing/technique   Wheelchair Mobility    Modified Rankin (Stroke Patients Only)       Balance Overall balance assessment: Needs assistance Sitting-balance support: No upper extremity supported Sitting balance-Leahy Scale: Normal     Standing balance support: No upper extremity supported;Bilateral upper extremity supported Standing balance-Leahy Scale: Fair                              Cognition Arousal/Alertness: Awake/alert Behavior During Therapy: WFL for tasks assessed/performed Overall Cognitive Status: Within Functional Limits for tasks assessed                                        Exercises Total Joint Exercises Quad Sets: 15 reps;Right;Supine Short Arc Quad: Right;10 reps;Supine Heel Slides: Right;10 reps;Supine Hip ABduction/ADduction: Right;5 reps;Supine Long Arc Quad: Right;10 reps;Seated    General Comments        Pertinent Vitals/Pain Pain Assessment: Faces Faces Pain Scale: Hurts  even more Pain Location: R hip Pain Descriptors / Indicators: Discomfort;Sore Pain Intervention(s): Limited activity within patient's tolerance;Monitored during session;Premedicated before session;Ice applied    Home Living                      Prior Function            PT Goals (current goals can now be found in the  care plan section) Acute Rehab PT Goals Patient Stated Goal: return home Potential to Achieve Goals: Good Progress towards PT goals: Progressing toward goals    Frequency    7X/week      PT Plan Current plan remains appropriate    Co-evaluation              AM-PAC PT "6 Clicks" Mobility   Outcome Measure  Help needed turning from your back to your side while in a flat bed without using bedrails?: None Help needed moving from lying on your back to sitting on the side of a flat bed without using bedrails?: A Little Help needed moving to and from a bed to a chair (including a wheelchair)?: A Little Help needed standing up from a chair using your arms (e.g., wheelchair or bedside chair)?: A Little Help needed to walk in hospital room?: A Little Help needed climbing 3-5 steps with a railing? : A Little 6 Click Score: 19    End of Session   Activity Tolerance: Patient tolerated treatment well Patient left: in chair;with call bell/phone within reach Nurse Communication: Mobility status PT Visit Diagnosis: Other abnormalities of gait and mobility (R26.89);Muscle weakness (generalized) (M62.81);Pain Pain - Right/Left: Right Pain - part of body: Hip     Time: 4132-4401 PT Time Calculation (min) (ACUTE ONLY): 39 min  Charges:  $Gait Training: 23-37 mins $Therapeutic Exercise: 8-22 mins                     Wyona Almas, PT, DPT Acute Rehabilitation Services Pager [phone] Office [phone]    Deno Etienne 11/20/2020, 10:33 AM

## 2020-11-20 NOTE — Progress Notes (Signed)
PT Cancellation Note  Patient Details Name: Sierra Perez MRN: [id number] DOB: [birth year]   Cancelled Treatment:    Reason Eval/Treat Not Completed: Other (comment) Pt pretty exhausted after walking to the bathroom and back. I reviewed education with pt and pt friend who was present. Will plan for one more morning session prior to discharge. Pt interested in setting up HHPT and this is appropriate.  Wyona Almas, PT, DPT Acute Rehabilitation Services Pager [phone] Office [phone]    Deno Etienne 11/20/2020, 5:06 PM

## 2020-11-20 NOTE — Progress Notes (Signed)
Subjective: 1 Day Post-Op Procedure(s) (LRB): RIGHT TOTAL HIP ARTHROPLASTY ANTERIOR APPROACH (Right) Patient reports pain as moderate.    Objective: Vital signs in last 24 hours: Temp:  [97.3 F (36.3 C)-99.1 F (37.3 C)] 99.1 F (37.3 C) (10/19 0517) Pulse Rate:  [65-110] 95 (10/19 0517) Resp:  [12-22] 18 (10/19 0517) BP: (90-142)/(53-70) 112/66 (10/19 0517) SpO2:  [92 %-100 %] 100 % (10/19 0517) Weight:  [106.6 kg] 106.6 kg (10/18 1006)  Intake/Output from previous day: 10/18 0701 - 10/19 0700 In: 700 [I.V.:600; IV Piggyback:100] Out: 1000 [Urine:650; Blood:350] Intake/Output this shift: No intake/output data recorded.  No results for input(s): HGB in the last 72 hours. No results for input(s): WBC, RBC, HCT, PLT in the last 72 hours. No results for input(s): NA, K, CL, CO2, BUN, CREATININE, GLUCOSE, CALCIUM in the last 72 hours. No results for input(s): LABPT, INR in the last 72 hours.  Sensation intact distally Intact pulses distally Dorsiflexion/Plantar flexion intact Incision: dressing C/D/I   Assessment/Plan: 1 Day Post-Op Procedure(s) (LRB): RIGHT TOTAL HIP ARTHROPLASTY ANTERIOR APPROACH (Right) Up with therapy      Mcarthur Rossetti 11/20/2020, 8:03 AM

## 2020-11-20 NOTE — Discharge Instructions (Signed)

## 2020-11-20 NOTE — Progress Notes (Signed)
Inpatient Diabetes Program Recommendations  AACE/ADA: New Consensus Statement on Inpatient Glycemic Control   Target Ranges:  Prepandial:   less than 140 mg/dL      Peak postprandial:   less than 180 mg/dL (1-2 hours)      Critically ill patients:  140 - 180 mg/dL   Results for Sierra Perez, Sierra Perez (MRN [id number]) as of 11/20/2020 11:19  Ref. Range 11/19/2020 10:06 11/19/2020 13:55 11/19/2020 16:52 11/19/2020 21:31 11/19/2020 23:32 11/20/2020 06:22  Glucose-Capillary Latest Ref Range: 70 - 99 mg/dL 135 (H) 100 (H) 168 (H) 330 (H) 272 (H) 172 (H)    Review of Glycemic Control  Diabetes history: DM2 Outpatient Diabetes medications: Metformin 500 mg BID Current orders for Inpatient glycemic control: Metformin 500 mg BID  Inpatient Diabetes Program Recommendations:    Insulin: Please consider ordering CBGs AC&HS with Novolog 0-9 units TID with meals and Novolog 0-5 units QHS.  Thanks, Barnie Alderman, RN, MSN, CDE Diabetes Coordinator Inpatient Diabetes Program [phone] (Team Pager from 8am to 5pm)

## 2020-11-21 DIAGNOSIS — M1611 Unilateral primary osteoarthritis, right hip: Secondary | ICD-10-CM | POA: Diagnosis not present

## 2020-11-21 LAB — GLUCOSE, CAPILLARY: Glucose-Capillary: 143 mg/dL — ABNORMAL HIGH (ref 70–99)

## 2020-11-21 LAB — CBC
HCT: 22.4 % — ABNORMAL LOW (ref 36.0–46.0)
Hemoglobin: 7.1 g/dL — ABNORMAL LOW (ref 12.0–15.0)
MCH: 25.9 pg — ABNORMAL LOW (ref 26.0–34.0)
MCHC: 31.7 g/dL (ref 30.0–36.0)
MCV: 81.8 fL (ref 80.0–100.0)
Platelets: 169 10*3/uL (ref 150–400)
RBC: 2.74 MIL/uL — ABNORMAL LOW (ref 3.87–5.11)
RDW: 17.4 % — ABNORMAL HIGH (ref 11.5–15.5)
WBC: 8.3 10*3/uL (ref 4.0–10.5)
nRBC: 0 % (ref 0.0–0.2)

## 2020-11-21 NOTE — Progress Notes (Signed)
Physical Therapy Treatment Patient Details Name: Sierra Perez MRN: [id number] DOB: 07-Oct-1960 Today's Date: 11/21/2020   History of Present Illness Pt is 60 yo female s/p R THA anterior approach on 11/19/20.  Pt with hx including but not limited to breast CA, mastectomy, clotting disorder, DM, complex regional pain syndrome, DM, HTN, L TKA    PT Comments    Pt reports significant RLE pain during session, however remains able to transfer and ambulate without physical assistance. Pt expresses concern over RLE strength limitations at this time as well as difficulty elevating RLE onto bed. PT provides education on bed mobility technique and further reinforcement of HEP to aide in activation of R hip musculature. Pt will benefit from continued aggressive mobilization to improve activity tolerance and to aide in a return to independence.   Recommendations for follow up therapy are one component of a multi-disciplinary discharge planning process, led by the attending physician.  Recommendations may be updated based on patient status, additional functional criteria and insurance authorization.  Follow Up Recommendations  Home health PT;Supervision for mobility/OOB     Equipment Recommendations  3in1 (PT) (pt owns RW already)    Recommendations for Other Services       Precautions / Restrictions Precautions Precautions: Fall;Anterior Hip Precaution Booklet Issued: No Precaution Comments: verbally review precaution to avoid excess hip extension Restrictions Weight Bearing Restrictions: Yes RLE Weight Bearing: Weight bearing as tolerated     Mobility  Bed Mobility Overal bed mobility: Needs Assistance Bed Mobility: Sit to Supine       Sit to supine: Min assist   General bed mobility comments: minA to aide in elevating RLE back onto bed, verbal cues for technique    Transfers Overall transfer level: Needs assistance Equipment used: Rolling walker (2 wheeled) Transfers: Sit  to/from Stand Sit to Stand: Supervision            Ambulation/Gait Ambulation/Gait assistance: Supervision Gait Distance (Feet): 200 Feet Assistive device: Rolling walker (2 wheeled) Gait Pattern/deviations: Step-through pattern Gait velocity: reduced   General Gait Details: pt with slowed step-through gait, reduced stance time on RLE. Intermittently pt reaching for RLE with RUE to advance. PT provides cues to maintain UEs on RW and utilize UEs to offload RLE to aide in advancing   Stairs Stairs:  (pt declines further stair training)           Wheelchair Mobility    Modified Rankin (Stroke Patients Only)       Balance Overall balance assessment: Needs assistance Sitting-balance support: No upper extremity supported;Feet supported Sitting balance-Leahy Scale: Good     Standing balance support: Single extremity supported Standing balance-Leahy Scale: Poor Standing balance comment: benefits from UE support of walker                            Cognition Arousal/Alertness: Awake/alert Behavior During Therapy: WFL for tasks assessed/performed Overall Cognitive Status: Within Functional Limits for tasks assessed                                        Exercises Total Joint Exercises Ankle Circles/Pumps: AROM;Both;5 reps Quad Sets: AROM;Both;5 reps Heel Slides: AROM;Both;5 reps Hip ABduction/ADduction: AROM;Right;5 reps    General Comments General comments (skin integrity, edema, etc.): VSS on RA      Pertinent Vitals/Pain Pain Assessment: 0-10 Pain Score: 8  Pain  Location: R hip Pain Descriptors / Indicators: Aching Pain Intervention(s): Monitored during session    Home Living                      Prior Function            PT Goals (current goals can now be found in the care plan section) Acute Rehab PT Goals Patient Stated Goal: return home Progress towards PT goals: Progressing toward goals     Frequency    7X/week      PT Plan Current plan remains appropriate    Co-evaluation              AM-PAC PT "6 Clicks" Mobility   Outcome Measure  Help needed turning from your back to your side while in a flat bed without using bedrails?: None Help needed moving from lying on your back to sitting on the side of a flat bed without using bedrails?: A Little Help needed moving to and from a bed to a chair (including a wheelchair)?: A Little Help needed standing up from a chair using your arms (e.g., wheelchair or bedside chair)?: A Little Help needed to walk in hospital room?: A Little Help needed climbing 3-5 steps with a railing? : A Little 6 Click Score: 19    End of Session   Activity Tolerance: Patient limited by pain Patient left: in bed;with call bell/phone within reach Nurse Communication: Mobility status PT Visit Diagnosis: Other abnormalities of gait and mobility (R26.89);Muscle weakness (generalized) (M62.81);Pain Pain - Right/Left: Right Pain - part of body: Hip     Time: 0826-0912 PT Time Calculation (min) (ACUTE ONLY): 46 min  Charges:  $Gait Training: 8-22 mins $Therapeutic Exercise: 8-22 mins $Therapeutic Activity: 8-22 mins                     Zenaida Niece, PT, DPT Acute Rehabilitation Pager: [phone] Office Ethel Luda [place] 11/21/2020, 9:54 AM

## 2020-11-21 NOTE — Plan of Care (Signed)
Patient alert and oriented, voiding adequately, skin clean, dry and intact without evidence of skin break down, or symptoms of complications - no redness or edema noted, only slight tenderness at site.  Patient states pain is manageable at time of discharge. Patient has an appointment with MD on Nov. 1st.

## 2020-11-21 NOTE — Evaluation (Signed)
Occupational Therapy Evaluation Patient Details Name: Sierra Perez MRN: [id number] DOB: 1960/05/16 Today's Date: 11/21/2020   History of Present Illness Pt is 60 yo female s/p R THA anterior approach on 11/19/20.  Pt with hx including but not limited to breast CA, mastectomy, clotting disorder, DM, complex regional pain syndrome, DM, HTN, L TKA   Clinical Impression   Pt reports she lives alone with her dog but will be able to have a friend to assist for a couple of days with the return home. Pt was educated on DME/AE on how to complete LE dressing/bathing, transfer techniques for bed mobility and positioning with ADLs. Pt was able to complete LE ADLS with AE with set up to min assist with use of AE. Pt currently with functional limitations due to the deficits listed below (see OT Problem List).  Pt will benefit from skilled OT to increase their safety and independence with ADL and functional mobility for ADL to facilitate discharge to venue listed below.        Recommendations for follow up therapy are one component of a multi-disciplinary discharge planning process, led by the attending physician.  Recommendations may be updated based on patient status, additional functional criteria and insurance authorization.   Follow Up Recommendations  Home health OT    Equipment Recommendations  3 in 1 bedside commode (Pt recomended shower chair but unsure of it will fit in shower, long handle reacher and sponge, sock aide)    Recommendations for Other Services       Precautions / Restrictions Precautions Precautions: Fall;Anterior Hip Precaution Booklet Issued: No Precaution Comments: verbally review precaution to avoid excess hip extension Restrictions Weight Bearing Restrictions: Yes RLE Weight Bearing: Weight bearing as tolerated      Mobility Bed Mobility Overal bed mobility: Needs Assistance Bed Mobility: Sit to Supine     Supine to sit: Supervision Sit to supine: Min assist    General bed mobility comments: minA to aide in elevating RLE back onto bed, verbal cues for technique and attempted to use gait belt to assist in leverage    Transfers Overall transfer level: Needs assistance Equipment used: Rolling walker (2 wheeled) Transfers: Sit to/from Stand Sit to Stand: Supervision              Balance Overall balance assessment: Needs assistance Sitting-balance support: No upper extremity supported;Feet supported Sitting balance-Leahy Scale: Good     Standing balance support: During functional activity;Single extremity supported Standing balance-Leahy Scale: Poor Standing balance comment: benefits from UE support of walker                           ADL either performed or assessed with clinical judgement   ADL Overall ADL's : Needs assistance/impaired Eating/Feeding: Independent;Cueing for safety;Sitting   Grooming: Wash/dry hands;Wash/dry face;Min guard;Cueing for safety;Cueing for sequencing;Standing   Upper Body Bathing: Modified independent;Sitting   Lower Body Bathing: Minimal assistance;Cueing for safety;Cueing for sequencing;Sit to/from stand   Upper Body Dressing : Modified independent;Sitting   Lower Body Dressing: Minimal assistance;Cueing for safety;Cueing for sequencing;Sit to/from stand   Toilet Transfer: Min guard;Cueing for safety;Cueing for sequencing;Ambulation;Grab bars;RW   Toileting- Water quality scientist and Hygiene: Min guard;Cueing for safety;Cueing for sequencing;Sit to/from stand       Functional mobility during ADLs: Min guard;Cueing for safety;Cueing for sequencing;Rolling walker       Vision         Perception     Praxis  Pertinent Vitals/Pain Pain Assessment: Faces Pain Score: 8  Faces Pain Scale: Hurts even more Pain Location: R hip Pain Descriptors / Indicators: Aching Pain Intervention(s): Monitored during session;Repositioned     Hand Dominance     Extremity/Trunk  Assessment Upper Extremity Assessment Upper Extremity Assessment: Overall WFL for tasks assessed (However, pt has hx double mastectomy)   Lower Extremity Assessment Lower Extremity Assessment: Defer to PT evaluation   Cervical / Trunk Assessment Cervical / Trunk Assessment: Normal   Communication Communication Communication: No difficulties   Cognition Arousal/Alertness: Awake/alert Behavior During Therapy: WFL for tasks assessed/performed Overall Cognitive Status: Within Functional Limits for tasks assessed                                     General Comments  VSS on RA    Exercises Exercises: Total Joint Total Joint Exercises Ankle Circles/Pumps: AROM;Both;5 reps Quad Sets: AROM;Both;5 reps Heel Slides: AROM;Both;5 reps Hip ABduction/ADduction: AROM;Right;5 reps   Shoulder Instructions      Home Living Family/patient expects to be discharged to:: Private residence Living Arrangements: Alone Available Help at Discharge: Friend(s);Available 24 hours/day Type of Home: House Home Access: Stairs to enter [employer] of Steps: 3 Entrance Stairs-Rails: Right;Left Home Layout: Multi-level;Able to live on main level with bedroom/bathroom     Bathroom Shower/Tub: Occupational psychologist: Standard     Home Equipment: Environmental consultant - 2 wheels;Cane - single point;Grab bars - tub/shower   Additional Comments: Pt reports grab abrs are in strange areas and limited ability to reach      Prior Functioning/Environment Level of Independence: Independent        Comments: Works from home        OT Problem List: Decreased strength;Decreased range of motion;Decreased activity tolerance;Impaired balance (sitting and/or standing);Decreased safety awareness;Decreased knowledge of use of DME or AE;Pain      OT Treatment/Interventions: Self-care/ADL training;Therapeutic exercise;DME and/or AE instruction;Therapeutic activities;Patient/family  education;Balance training    OT Goals(Current goals can be found in the care plan section) Acute Rehab OT Goals Patient Stated Goal: return home OT Goal Formulation: With patient Time For Goal Achievement: 11/30/20 Potential to Achieve Goals: Good  OT Frequency: Min 2X/week   Barriers to D/C:            Co-evaluation              AM-PAC OT "6 Clicks" Daily Activity     Outcome Measure Help from another person eating meals?: None Help from another person taking care of personal grooming?: None Help from another person toileting, which includes using toliet, bedpan, or urinal?: A Little Help from another person bathing (including washing, rinsing, drying)?: A Little Help from another person to put on and taking off regular upper body clothing?: None Help from another person to put on and taking off regular lower body clothing?: A Little 6 Click Score: 21   End of Session Equipment Utilized During Treatment: Gait belt;Rolling walker Nurse Communication: Other (comment) (therapy needs)  Activity Tolerance: Patient tolerated treatment well Patient left: in bed;with call bell/phone within reach  OT Visit Diagnosis: Unsteadiness on feet (R26.81);Other abnormalities of gait and mobility (R26.89);Muscle weakness (generalized) (M62.81);Pain Pain - Right/Left: Right Pain - part of body: Hip                Time: 3536-1443 OT Time Calculation (min): 45 min Charges:  OT General Charges $OT  Visit: 1 Visit OT Evaluation $OT Eval Low Complexity: 1 Low OT Treatments $Self Care/Home Management : 23-37 mins  Joeseph Amor OTR/L  Acute Rehab Services  [phone] office number [phone] pager number   Joeseph Amor 11/21/2020, 10:33 AM

## 2020-11-21 NOTE — Discharge Summary (Signed)
Patient ID: Sierra Perez MRN: [id number] DOB/AGE: 1960/07/16 60 y.o.  Admit date: 11/19/2020 Discharge date: 11/21/2020  Admission Diagnoses:  Principal Problem:   Unilateral primary osteoarthritis, right hip Active Problems:   Status post total replacement of right hip   Discharge Diagnoses:  Same  Past Medical History:  Diagnosis Date   Anemia    Anxiety    Asthma    Breast cancer ([place]) 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, her-2 positive, ER/PR negative   Clotting disorder ([place])    testing was negative, h/o DVT while on treatment   Complex regional pain syndrome i of right lower limb    RDS   Diabetes mellitus without complication ([place])    DVT (deep venous thrombosis) ([place])    started in 2015, multiple   Family history of breast cancer    Family history of colon cancer    Fibroid    GERD (gastroesophageal reflux disease)    Heart murmur    HPV in female    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 female who was admitted 11/19/2020 for operative treatment ofUnilateral primary osteoarthritis, right hip. Patient has severe unremitting pain that affects sleep, daily activities, and work/hobbies. After pre-op clearance the patient was taken to the operating room on 11/19/2020 and underwent  Procedure(s): RIGHT TOTAL HIP ARTHROPLASTY ANTERIOR APPROACH.    Patient was given perioperative antibiotics:  Anti-infectives (From admission, onward)    Start     Dose/Rate Route Frequency Ordered Stop   11/20/20 1000  valACYclovir (VALTREX) tablet 500 mg        500 mg Oral Daily 11/19/20 1531     11/19/20 1800  clindamycin  (CLEOCIN) IVPB 600 mg        600 mg 100 mL/hr over 30 Minutes Intravenous Every 6 hours 11/19/20 1531 11/20/20 0010   11/19/20 1015  clindamycin (CLEOCIN) IVPB 900 mg        900 mg 100 mL/hr over 30 Minutes Intravenous On call to O.R. 11/19/20 1006 11/19/20 1224        Patient was given sequential compression devices, early ambulation, and chemoprophylaxis to prevent DVT.  Patient benefited maximally from hospital stay and there were no complications.    Recent vital signs: Patient Vitals for the past 24 hrs:  BP Temp Temp src Pulse Resp SpO2  11/21/20 0747 (!) 102/51 99.6 F (37.6 C) Oral 92 16 100 %  11/21/20 0349 115/60 99.1 F (37.3 C) Oral 92 18 97 %  11/20/20 2339 (!) 119/57 99.4 F (37.4 C) Oral 90 18 97 %  11/20/20 2024 (!) 123/54 99.2 F (37.3 C) Oral 99 20 96 %     Recent laboratory studies:  Recent Labs    11/20/20 0724 11/21/20 0259  WBC 9.5 8.3  HGB 7.7* 7.1*  HCT 24.8* 22.4*  PLT 204 169  NA 133*  --   K 4.5  --   CL 100  --   CO2 24  --   BUN 15  --   CREATININE 0.87  --   GLUCOSE 217*  --   CALCIUM  8.5*  --      Discharge Medications:   Allergies as of 11/21/2020       Reactions   Succinylcholine Other (See Comments)   Muscle aching 18 years ago from 07/02/20   Keflex [cephalexin] Diarrhea, Nausea And Vomiting   Sulfa Antibiotics Rash   And swelling generalized not in throat        Medication List     TAKE these medications    albuterol 108 (90 Base) MCG/ACT inhaler Commonly known as: VENTOLIN HFA Inhale 1 puff into the lungs every 6 (six) hours as needed for wheezing or shortness of breath.   amLODipine 5 MG tablet Commonly known as: NORVASC Take 5 mg by mouth daily.   anastrozole 1 MG tablet Commonly known as: ARIMIDEX Take 1 tablet (1 mg total) by mouth daily. What changed: when to take this   CALCIUM CITRATE PO Take 2 tablets by mouth daily. Zinc  Copper Vit d  Manganese Sodium   CULTURELLE PO Take 1 capsule by  mouth daily.   DULoxetine 60 MG capsule Commonly known as: CYMBALTA Take 60 mg by mouth See admin instructions. Take with 30 mg for a total of 90 mg daily   DULoxetine 30 MG capsule Commonly known as: CYMBALTA Take 30 mg by mouth See admin instructions. Take with 60  mg for a total 90 mg in the morning   Eliquis 5 MG Tabs tablet Generic drug: apixaban TAKE 1 TABLET(5 MG) BY MOUTH TWICE DAILY   fluticasone 50 MCG/ACT nasal spray Commonly known as: FLONASE Place 2 sprays into both nostrils daily.   gabapentin 800 MG tablet Commonly known as: NEURONTIN Take 1 tablet (800 mg total) by mouth 2 (two) times daily. What changed:  how much to take when to take this   guaiFENesin 600 MG 12 hr tablet Commonly known as: MUCINEX Take 1,200 mg by mouth 2 (two) times daily.   ipratropium 0.03 % nasal spray Commonly known as: ATROVENT Place 2 sprays into both nostrils 2 (two) times daily.   levothyroxine 50 MCG tablet Commonly known as: SYNTHROID Take 50 mcg by mouth every morning.   MEDERMA ADVANCED SCAR GEL EX Apply 1 application topically daily.   metFORMIN 500 MG tablet Commonly known as: GLUCOPHAGE Take 500 mg by mouth 2 (two) times daily.   methocarbamol 500 MG tablet Commonly known as: ROBAXIN Take 1 tablet (500 mg total) by mouth every 6 (six) hours as needed for muscle spasms.   oxyCODONE 5 MG immediate release tablet Commonly known as: Oxy IR/ROXICODONE Take 1-2 tablets (5-10 mg total) by mouth every 4 (four) hours as needed for moderate pain (pain score 4-6).   pantoprazole 40 MG tablet Commonly known as: Protonix Take 1 tablet (40 mg total) by mouth daily.   sennosides-docusate sodium 8.6-50 MG tablet Commonly known as: SENOKOT-S Take 2 tablets by mouth daily.   valACYclovir 500 MG tablet Commonly known as: VALTREX Take 1 tablet (500 mg total) by mouth daily.   Vitamin D-3 25 MCG (1000 UT) Caps Take 1,000 Units by mouth daily.                Durable Medical Equipment  (From admission, onward)           Start     Ordered   11/19/20 1532  DME 3 n 1  Once        11/19/20 1531   11/19/20 1532  DME Walker rolling  Once  Question Answer Comment  Walker: With [place]   Patient needs a walker to treat with the following condition Status post total replacement of right hip      11/19/20 1531            Diagnostic Studies: DG Pelvis Portable  Result Date: 11/19/2020 CLINICAL DATA:  Postop hip replacement EXAM: PORTABLE PELVIS 1-2 VIEWS COMPARISON:  11/19/2020 FINDINGS: Status post right hip replacement with intact hardware and normal alignment. Pubic symphysis and rami appear intact IMPRESSION: Status post right hip replacement with expected postsurgical change Electronically Signed   By: Donavan Foil M.D.   On: 11/19/2020 17:17   DG C-Arm 1-60 Min-No Report  Result Date: 11/19/2020 CLINICAL DATA:  Right total hip arthroplasty via anterior approach. EXAM: OPERATIVE RIGHT HIP (WITH PELVIS IF PERFORMED) 7 VIEWS TECHNIQUE: Fluoroscopic spot image(s) were submitted for interpretation post-operatively. FLUOROSCOPY TIME:  19 seconds (2.9 mGy) COMPARISON:  Right hip radiographs-09/28/2019 FINDINGS: Seven spot intraoperative fluoroscopic images of the lower pelvis and right hip are provided for review. Images demonstrates sequela of right total hip replacement. Alignment appears anatomic given anterior projection. There is a minimal amount of expected subcutaneous emphysema about the operative site. No radiopaque foreign body. Limited visualization of the lower pelvis and contralateral left hip is normal. IMPRESSION: Post right total hip replacement without evidence of complication. Electronically Signed   By: Sandi Mariscal M.D.   On: 11/19/2020 15:04   DG C-Arm 1-60 Min-No Report  Result Date: 11/19/2020 CLINICAL DATA:  Right total hip arthroplasty via anterior approach. EXAM: OPERATIVE RIGHT HIP (WITH PELVIS IF PERFORMED)  7 VIEWS TECHNIQUE: Fluoroscopic spot image(s) were submitted for interpretation post-operatively. FLUOROSCOPY TIME:  19 seconds (2.9 mGy) COMPARISON:  Right hip radiographs-09/28/2019 FINDINGS: Seven spot intraoperative fluoroscopic images of the lower pelvis and right hip are provided for review. Images demonstrates sequela of right total hip replacement. Alignment appears anatomic given anterior projection. There is a minimal amount of expected subcutaneous emphysema about the operative site. No radiopaque foreign body. Limited visualization of the lower pelvis and contralateral left hip is normal. IMPRESSION: Post right total hip replacement without evidence of complication. Electronically Signed   By: Sandi Mariscal M.D.   On: 11/19/2020 15:04   DG HIP OPERATIVE UNILAT WITH PELVIS RIGHT  Result Date: 11/19/2020 CLINICAL DATA:  Right total hip arthroplasty via anterior approach. EXAM: OPERATIVE RIGHT HIP (WITH PELVIS IF PERFORMED) 7 VIEWS TECHNIQUE: Fluoroscopic spot image(s) were submitted for interpretation post-operatively. FLUOROSCOPY TIME:  19 seconds (2.9 mGy) COMPARISON:  Right hip radiographs-09/28/2019 FINDINGS: Seven spot intraoperative fluoroscopic images of the lower pelvis and right hip are provided for review. Images demonstrates sequela of right total hip replacement. Alignment appears anatomic given anterior projection. There is a minimal amount of expected subcutaneous emphysema about the operative site. No radiopaque foreign body. Limited visualization of the lower pelvis and contralateral left hip is normal. IMPRESSION: Post right total hip replacement without evidence of complication. Electronically Signed   By: Sandi Mariscal M.D.   On: 11/19/2020 15:04   US Abdomen Limited RUQ (LIVER/GB)  Result Date: 11/16/2020 CLINICAL DATA:  Right upper quadrant pain with nausea and vomiting EXAM: ULTRASOUND ABDOMEN LIMITED RIGHT UPPER QUADRANT COMPARISON:  None. FINDINGS: Gallbladder: No gallstones  or wall thickening visualized. No sonographic Murphy sign noted by sonographer. Common bile duct: Diameter: 2.4 mm. Liver: Mild increased echogenicity is noted consistent with fatty infiltration. No focal mass is seen. Portal vein is patent on color Doppler imaging with  normal direction of blood flow towards the liver. Other: None. IMPRESSION: Fatty liver. No other focal abnormality is noted. Electronically Signed   By: Inez Catalina M.D.   On: 11/16/2020 22:50    Disposition: Discharge disposition: 01-Home or [place]     Mcarthur Rossetti, MD Follow up in 2 week(s).   Specialty: Orthopedic Surgery Contact information: [place] [address] [phone]         Care, [facility] Follow up.   Specialty: Home Health Services Why: Your home health has been set up with [facility]. The office will call you with start of service info. If you have any questions or concerns please call the number listed above. Contact information: [place] [address] [phone]                  Signed: Mcarthur Rossetti 11/21/2020, 4:28 PM

## 2020-11-21 NOTE — TOC Progression Note (Signed)
Transition of Care [facility]) - Progression Note    Patient Details  Name: Mckennah Kretchmer MRN: [id number] Date of Birth: 12-23-1960  Transition of Care [facility]) CM/SW [place], RN Phone [phone]  11/21/2020, 10:18 AM  Clinical Narrative:    TOC consulted for [facility] needs. CM at bedside to offer patient choice. HH has been set up with [facility]. Info has been added to the avs. No other needs noted at this time.         Expected Discharge Plan and Services                                                 Social Determinants of Health (SDOH) Interventions    Readmission Risk Interventions No flowsheet data found.

## 2020-11-21 NOTE — Progress Notes (Signed)
Subjective: 2 Days Post-Op Procedure(s) (LRB): RIGHT TOTAL HIP ARTHROPLASTY ANTERIOR APPROACH (Right) Patient reports pain as moderate.    Objective: Vital signs in last 24 hours: Temp:  [99.1 F (37.3 C)-99.6 F (37.6 C)] 99.6 F (37.6 C) (10/20 0747) Pulse Rate:  [90-99] 92 (10/20 0747) Resp:  [16-20] 16 (10/20 0747) BP: (102-123)/(51-60) 102/51 (10/20 0747) SpO2:  [96 %-100 %] 100 % (10/20 0747)  Intake/Output from previous day: No intake/output data recorded. Intake/Output this shift: No intake/output data recorded.  Recent Labs    11/20/20 0724 11/21/20 0259  HGB 7.7* 7.1*   Recent Labs    11/20/20 0724 11/21/20 0259  WBC 9.5 8.3  RBC 3.02* 2.74*  HCT 24.8* 22.4*  PLT 204 169   Recent Labs    11/20/20 0724  NA 133*  K 4.5  CL 100  CO2 24  BUN 15  CREATININE 0.87  GLUCOSE 217*  CALCIUM 8.5*   No results for input(s): LABPT, INR in the last 72 hours.  Neurovascular intact Sensation intact distally Intact pulses distally Dorsiflexion/Plantar flexion intact Incision: scant drainage   Assessment/Plan: 2 Days Post-Op Procedure(s) (LRB): RIGHT TOTAL HIP ARTHROPLASTY ANTERIOR APPROACH (Right) Up with therapy Discharge home with home health      Mcarthur Rossetti 11/21/2020, 11:48 AM

## 2020-11-25 ENCOUNTER — Encounter: Payer: Self-pay | Admitting: Orthopaedic Surgery

## 2020-11-25 ENCOUNTER — Encounter: Payer: Self-pay | Admitting: Hematology and Oncology

## 2020-11-29 ENCOUNTER — Other Ambulatory Visit: Payer: Self-pay | Admitting: Orthopaedic Surgery

## 2020-11-29 ENCOUNTER — Telehealth: Payer: Self-pay | Admitting: Orthopaedic Surgery

## 2020-11-29 MED ORDER — OXYCODONE HCL 5 MG PO TABS: 5.0000 mg | ORAL_TABLET | Freq: Four times a day (QID) | ORAL | 0 refills | Status: DC | PRN

## 2020-11-29 NOTE — Telephone Encounter (Signed)
I called and advised pt

## 2020-11-29 NOTE — Telephone Encounter (Signed)
Pt called asking to have a refill of her pain rx sent in; she states is she can't have oxycodone called in she would like to have anything that isn't nsaids. Pt would like a CB to update her on what's been sent in.  [phone]

## 2020-11-29 NOTE — Telephone Encounter (Signed)
Please advise 

## 2020-12-03 ENCOUNTER — Encounter: Payer: Self-pay | Admitting: Orthopaedic Surgery

## 2020-12-03 ENCOUNTER — Telehealth: Payer: Self-pay | Admitting: Orthopaedic Surgery

## 2020-12-03 ENCOUNTER — Ambulatory Visit (INDEPENDENT_AMBULATORY_CARE_PROVIDER_SITE_OTHER): Payer: 59 | Admitting: Orthopaedic Surgery

## 2020-12-03 DIAGNOSIS — Z96641 Presence of right artificial hip joint: Secondary | ICD-10-CM | POA: Insufficient documentation

## 2020-12-03 MED ORDER — HYDROCODONE-ACETAMINOPHEN 5-325 MG PO TABS: 1.0000 | ORAL_TABLET | Freq: Four times a day (QID) | ORAL | 0 refills | Status: DC | PRN

## 2020-12-03 NOTE — Progress Notes (Signed)
The patient is being seen today for first postoperative visit status post a right total hip arthroplasty.  She is doing well overall.  She is transitioning from a walker to a cane.  She does have some right knee pain to be expected after surgery and I explained why this is the case.  The right hip itself is doing well.  Her staples removed and Steri-Strips applied.  She does have a hematoma but no seroma.  She is on Eliquis that she was on prior to surgery.  I did let her know that she can get wet in shower.  She will avoid submerging underwater for at least 4 more weeks.  She can drop from my standpoint.  She would like to decrease from oxycodone to hydrocodone so I will send in some more hydrocodone for her.  All questions and concerns were answered and addressed.  She will alternate ice and heat and can continue to increase her activities as comfort allows.  I like to see her back in 4 weeks for repeat exam but no x-rays are needed.

## 2020-12-03 NOTE — Telephone Encounter (Signed)
I called and talked to the pt. 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 She would like to discuss going back at her next visit. I told her that was fine. She also asked about flying. I informed pt it was ok but to wear the compression socks and get up and move if possible to reduce risk of DVT. She stated understanding and stated she is already on elliquis.

## 2020-12-03 NOTE — Telephone Encounter (Signed)
Please advise 

## 2020-12-03 NOTE — Telephone Encounter (Signed)
Pt wants to know if she can get an estimate of when she will be going back for her work, does not need note for them but wants to know what the estimate will be after today's visit. The best call back number is [phone] and pt stated can leave a vm if she does not answer.

## 2020-12-11 ENCOUNTER — Encounter: Payer: Self-pay | Admitting: Vascular Surgery

## 2020-12-11 ENCOUNTER — Other Ambulatory Visit: Payer: Self-pay

## 2020-12-11 ENCOUNTER — Ambulatory Visit (INDEPENDENT_AMBULATORY_CARE_PROVIDER_SITE_OTHER): Payer: 59 | Admitting: Vascular Surgery

## 2020-12-11 VITALS — BP 123/85 | HR 84 | Temp 98.3°F | Resp 20 | Ht 68.0 in | Wt 227.0 lb

## 2020-12-11 DIAGNOSIS — I82599 Chronic embolism and thrombosis of other specified deep vein of unspecified lower extremity: Secondary | ICD-10-CM | POA: Diagnosis not present

## 2020-12-11 NOTE — Progress Notes (Signed)
Patient ID: Sierra Perez, female   DOB: December 06, 1960, 60 y.o.   MRN: [id number]  Reason for Consult: New Patient (Initial Visit)   Referred by Wenda Low, MD  Subjective:     HPI:  Sierra Perez is a 60 y.o. female originally from [place].  She had a filter placed in [place] in 2018 at the time of having knee replacement surgery on the left.  She has 4 previous histories of DVT.  She had 2 of her DVTs while on Lovenox and Xarelto respectively.  She has subsequently been on Eliquis for 4 years with no further DVTs.  She does have her filter in place has not had any known complications from this.  She does not have any family history of clotting disorders her father actually has a factor VIII deficiency.  She does have right knee pain will possibly need a knee replacement of the right in the future.  She currently walks with the help of a cane.  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   Hypertension    Hypothyroidism    IBS (irritable bowel syndrome)    Mitral valve prolapse    Personal history of malignant neoplasm of breast    Thyroid disease    Family History  Problem Relation Age of Onset   Hypertension Mother    Skin cancer Mother    Diabetes Father    Kidney disease Father    Dementia Father    Factor VIII deficiency Father    Bladder Cancer Maternal Grandmother        dx in late  84s; smoker   Stroke Maternal Grandmother    Lung cancer Maternal Grandfather    Colon cancer Maternal Grandfather 72   Thyroid cancer Maternal Grandfather        dx in his 53s   Breast cancer Paternal Grandmother        dx in 41s   Prostate cancer Other        MGMs brother   Past Surgical History:  Procedure Laterality Date   ABDOMINAL HYSTERECTOMY  2010   fibroids, h/o abnormal pap smears   AXILLARY SENTINEL NODE BIOPSY Right 07/08/2020   Procedure: RIGHT AXILLARY SENTINEL NODE BIOPSY;  Surgeon: Rolm Bookbinder, MD;  Location: [facility];  Service: General;  Laterality: Right;   BREAST RECONSTRUCTION WITH PLACEMENT OF TISSUE EXPANDER AND FLEX HD (ACELLULAR HYDRATED DERMIS) Bilateral 07/08/2020   Procedure: BILATERAL BREAST RECONSTRUCTION WITH  FLEX HD (ACELLULAR HYDRATED DERMIS),DIRECT IMPLANT RECONSTRUCTION;  Surgeon: Cindra Presume, MD;  Location: [facility];  Service: Plastics;  Laterality: Bilateral;   BREAST SURGERY Right 2015   right lumpectomy with reduction and lift   COLONOSCOPY     DENTAL SURGERY  ESOPHAGOGASTRODUODENOSCOPY     FOOT SURGERY     dislocated toe   LAPAROSCOPIC GASTRIC SLEEVE RESECTION  2014   LAPAROSCOPIC OOPHERECTOMY Bilateral 2016   REPLACEMENT TOTAL KNEE Left 2018   also had 3 prior arthroscopies   TOTAL HIP ARTHROPLASTY Right 11/19/2020   Procedure: RIGHT TOTAL HIP ARTHROPLASTY ANTERIOR APPROACH;  Surgeon: Mcarthur Rossetti, MD;  Location: [facility];  Service: Orthopedics;  Laterality: Right;   TOTAL MASTECTOMY Bilateral 07/08/2020   Procedure: BILATERAL TOTAL MASTECTOMY;  Surgeon: Rolm Bookbinder, MD;  Location: [facility];  Service: General;  Laterality: Bilateral;   VENA CAVA FILTER PLACEMENT  2017    Short Social History:  Social History   Tobacco Use   Smoking status: Never   Smokeless tobacco: Never  Substance Use Topics   Alcohol use: Yes    Comment: occ    Allergies  Allergen Reactions   Succinylcholine Other (See Comments)    Muscle  aching  18 years ago from 07/02/20   Keflex [Cephalexin] Diarrhea and Nausea And Vomiting   Sulfa Antibiotics Rash    And swelling generalized not in throat    Current Outpatient Medications  Medication Sig Dispense Refill   albuterol (VENTOLIN HFA) 108 (90 Base) MCG/ACT inhaler Inhale 1 puff into the lungs every 6 (six) hours as needed for wheezing or shortness of breath.     amLODipine (NORVASC) 5 MG tablet Take 5 mg by mouth daily.     anastrozole (ARIMIDEX) 1 MG tablet Take 1 tablet (1 mg total) by mouth daily. (Patient taking differently: Take 1 mg by mouth at bedtime.) 90 tablet 3   CALCIUM CITRATE PO Take 2 tablets by mouth daily. Zinc  Copper Vit d  Manganese Sodium     Cholecalciferol (VITAMIN D-3) 25 MCG (1000 UT) CAPS Take 1,000 Units by mouth daily.     DULoxetine (CYMBALTA) 30 MG capsule Take 30 mg by mouth See admin instructions. Take with 60  mg for a total 90 mg in the morning     DULoxetine (CYMBALTA) 60 MG capsule Take 60 mg by mouth See admin instructions. Take with 30 mg for a total of 90 mg daily     ELIQUIS 5 MG TABS tablet TAKE 1 TABLET(5 MG) BY MOUTH TWICE DAILY 180 tablet 0   fluticasone (FLONASE) 50 MCG/ACT nasal spray Place 2 sprays into both nostrils daily.     gabapentin (NEURONTIN) 800 MG tablet Take 1 tablet (800 mg total) by mouth 2 (two) times daily. (Patient taking differently: Take 600 mg by mouth 3 (three) times daily.)     HYDROcodone-acetaminophen (NORCO/VICODIN) 5-325 MG tablet Take 1-2 tablets by mouth every 6 (six) hours as needed for moderate pain. 30 tablet 0   ipratropium (ATROVENT) 0.03 % nasal spray Place 2 sprays into both nostrils 2 (two) times daily.     Lactobacillus Rhamnosus, GG, (CULTURELLE PO) Take 1 capsule by mouth daily.     levothyroxine (SYNTHROID) 50 MCG tablet Take 50 mcg by mouth every morning.     metFORMIN (GLUCOPHAGE) 500 MG tablet Take 500 mg by mouth 2 (two) times daily.     methocarbamol (ROBAXIN) 500 MG tablet Take 1  tablet (500 mg total) by mouth every 6 (six) hours as needed for muscle spasms. 40 tablet 1   oxyCODONE (OXY IR/ROXICODONE) 5 MG immediate release tablet Take 1-2 tablets (5-10 mg total) by mouth every 6 (six) hours as needed for moderate pain (pain score 4-6). 30 tablet 0   pantoprazole (  PROTONIX) 40 MG tablet Take 1 tablet (40 mg total) by mouth daily.     Scar Treatment Products [facility] ADVANCED SCAR GEL EX) Apply 1 application topically daily.     sennosides-docusate sodium (SENOKOT-S) 8.6-50 MG tablet Take 2 tablets by mouth daily.     valACYclovir (VALTREX) 500 MG tablet Take 1 tablet (500 mg total) by mouth daily. 30 tablet 12   No current facility-administered medications for this visit.    Review of Systems  Constitutional:  Constitutional negative. HENT: HENT negative.  Eyes: Eyes negative.  Respiratory: Respiratory negative.  Cardiovascular: Cardiovascular negative.  GI: Gastrointestinal negative.  Musculoskeletal: Positive for gait problem and joint pain.  Hematologic:       Clotting disorder Psychiatric: Psychiatric negative.       Objective:  Objective   Vitals:   12/11/20 1119  BP: 123/85  Pulse: 84  Resp: 20  Temp: 98.3 F (36.8 C)  SpO2: 99%  Weight: 227 lb (103 kg)  Height: _0  (1.727 m)   Body mass index is 34.52 kg/m.  Physical Exam HENT:     Head: Normocephalic.     Nose:     Comments: Wearing a mask Eyes:     Pupils: Pupils are equal, round, and reactive to light.  Cardiovascular:     Rate and Rhythm: Normal rate.     Pulses: Normal pulses.  Pulmonary:     Effort: Pulmonary effort is normal.  Abdominal:     General: Abdomen is flat.     Palpations: Abdomen is soft. There is no mass.  Musculoskeletal:        General: Normal range of motion.     Cervical back: Normal range of motion and neck supple.     Comments: She does have minimal varicosities bilateral medial legs  Skin:    General: Skin is warm and dry.     Capillary Refill:  Capillary refill takes less than 2 seconds.  Neurological:     General: No focal deficit present.     Mental Status: She is alert.  Psychiatric:        Mood and Affect: Mood normal.        Behavior: Behavior normal.        Thought Content: Thought content normal.    Data: I reviewed her previous abdominal x-ray which demonstrates a filter with hook that is somewhat to the left of midline     Assessment/Plan:     60 year old female with previous IVC filter placement 4 years ago at the time of left knee replacement complication with history of 4 previous DVTs but none since being placed on Eliquis 4 years ago.  She does have right knee problems will possibly need knee replacement in the future.  We discussed removing versus keeping the filter.  At this time with the need for possible procedures in the future that would require filter placement we have elected to keep the filter and I think as long as she has Eliquis she should not have any complications from the filter.  Certainly if she elects for no further surgeries it would be ideal to have the filter removed so that she does not have any complications from it does appear that there is a hook that although somewhat left of midline would be amenable to retrieval.  We may want to consider noncontrasted CT prior to removal in the future if she elects to go this route.  She will continue Eliquis and can follow-up with  me on an as-needed basis.    Waynetta Sandy MD [facility]

## 2021-01-06 ENCOUNTER — Ambulatory Visit: Payer: 59

## 2021-01-06 ENCOUNTER — Encounter: Payer: Self-pay | Admitting: Orthopaedic Surgery

## 2021-01-06 ENCOUNTER — Ambulatory Visit (INDEPENDENT_AMBULATORY_CARE_PROVIDER_SITE_OTHER): Payer: 59 | Admitting: Orthopaedic Surgery

## 2021-01-06 ENCOUNTER — Telehealth: Payer: Self-pay | Admitting: Plastic Surgery

## 2021-01-06 DIAGNOSIS — Z96641 Presence of right artificial hip joint: Secondary | ICD-10-CM

## 2021-01-06 NOTE — Telephone Encounter (Signed)
Patient is scheduled for 1st tattoo treatment with [facility] 03/04/2021, she already has the numbing cream but is unsure of how soon and exactly how to apply the numbing cream.  Please follow up with patient.

## 2021-01-06 NOTE — Progress Notes (Signed)
Sheika will be 7 weeks tomorrow status post a right total hip arthroplasty.  She does report that she is walking with just a slight limp but does have increased range of motion and strength with her hip.  She is having groin pain and pain just posterior to her incision and she has marked a spot.  I do feel some of this is IT band and trochanteric related which is appropriate postoperative at this standpoint.  I did explain that to her.  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  She has had a left knee replacement.  She did ask appropriate questions about knee brace for the right knee.  Her right operative hip moves smoothly and fluidly.  There is no blocks to rotation.  Her incision is healed nicely.  There is no significant fluid collection at this standpoint.  I gave her reassurance that the pain she is experiencing is normal postoperative after hip replacement surgery and will hopefully subside with time.  She will return to work on 20 December and I gave her a note for that.  I do not need to see her back for 6 months with a standing AP pelvis and lateral of the right operative hip.  If there are issues before then she will reach out to [place].

## 2021-01-07 ENCOUNTER — Other Ambulatory Visit: Payer: Self-pay

## 2021-01-07 MED ORDER — AMOXICILLIN 500 MG PO TABS: ORAL_TABLET | ORAL | 0 refills | Status: DC

## 2021-01-09 NOTE — Telephone Encounter (Signed)
Spoke with the patient on (01/08/21) regarding her message below.  Informed the patient that I spoke with [facility] and he stated for her to bring the numbing cream with her and he will have time to put it on her because he starts with the Tattoo.  Patient verbalized understanding and agreed.//AB/CMA

## 2021-01-17 ENCOUNTER — Ambulatory Visit: Payer: 59 | Admitting: Hematology and Oncology

## 2021-01-22 NOTE — Progress Notes (Signed)
Patient Care Team: Wenda Low, MD as PCP - General (Internal Medicine) Rockwell Germany, RN as Oncology Nurse Navigator Mauro Kaufmann, RN as Oncology Nurse Navigator Nicholas Lose, MD as Consulting Physician (Hematology and Oncology)  DIAGNOSIS:    ICD-10-CM   1. Malignant neoplasm of upper-outer quadrant of right breast in female, estrogen receptor negative ([place])  C50.411    Z17.1       SUMMARY OF ONCOLOGIC HISTORY: Oncology History  Malignant neoplasm of upper-outer quadrant of right breast in female, estrogen receptor negative ([place])  08/27/2012 Initial Diagnosis   Stage 1a IDC with DCIS (T2N0M0) : ER-, PR-, HER positive 3+, Ki67 20-25%   08/27/2012 Cancer Staging   Staging form: Breast, AJCC 8th Edition - Clinical stage from 08/27/2012: Stage IIA (cT2, cN0, cM0, G3, ER-, PR-, HER2+) - Signed by Nicholas Lose, MD on 11/17/2019    09/06/2012 Breast MRI   Right breast, lateral to the nipple, 10x14x47m mass with mildly irregular borders   09/10/2012 Echocardiogram   EF 55-60%    Chemotherapy   Neoadjuvant Taxotere/Carboplatin/Herceptin/Perjeta followed by adjuvant Herceptin maintenance   01/09/2013 Breast MRI   Complete pathological response to neoadjuvant treatment: right breast mass previously noted at 9:00 position no longer identified.   02/13/2013 Surgery   Right breast lumpectomy: benign parenchyma with some stromal fibrosis consistent with previous tumor site, focal area of atypical lobular hyperplasia, all lymph nodes negative.     Radiation Therapy   Adjuvant radiation   04/06/2013 Imaging   DEXA: T-score of -1.1, osteopenia    09/25/2016 Initial Biopsy   Columnar cell alteration with atypia, foreign body granuloma   05/10/2020 Relapse/Recurrence   Breast MRI detected new linear non-mass enhancement right breast 2.1 cm, non-mass enhancement centrally left breast spanning 10.5 cm, left breast nipple areolar complex enhancement: Biopsy right breast: Grade 2  invasive lobular cancer.  ER/PR positive HER-2 equivocal by IHC FISH pending, Ki-67 1% left breast biopsy anterior fibrocystic change, posterior atypical lobular hyperplasia/LCIS   05/26/2020 Genetic Testing   Negative genetic testing on the CancerNext-Expanded+RNAinsight panel.  The CancerNext-Expanded gene panel offered by [facility] includes sequencing and rearrangement analysis for the following 77 genes: AIP, ALK, APC*, ATM*, AXIN2, BAP1, BARD1, BLM, BMPR1A, BRCA1*, BRCA2*, BRIP1*, CDC73, CDH1*, CDK4, CDKN1B, CDKN2A, CHEK2*, CTNNA1, DICER1, FANCC, FH, FLCN, GALNT12, KIF1B, LZTR1, MAX, MEN1, MET, MLH1*, MSH2*, MSH3, MSH6*, MUTYH*, NBN, NF1*, NF2, NTHL1, PALB2*, PHOX2B, PMS2*, POT1, PRKAR1A, PTCH1, PTEN*, RAD51C*, RAD51D*, RB1, RECQL, RET, SDHA, SDHAF2, SDHB, SDHC, SDHD, SMAD4, SMARCA4, SMARCB1, SMARCE1, STK11, SUFU, TMEM127, TP53*, TSC1, TSC2, VHL and XRCC2 (sequencing and deletion/duplication); EGFR, EGLN1, HOXB13, KIT, MITF, PDGFRA, POLD1, and POLE (sequencing only); EPCAM and GREM1 (deletion/duplication only). DNA and RNA analyses performed for * genes. The report date is May 26, 2020.      CHIEF COMPLIANT: Follow-up of right breast cancer, left breast ALH/LCIS, new onset of iron deficiency anemia  INTERVAL HISTORY: Sierra Perez is a 60y.o. with above-mentioned history of right breast cancer previously treated with neoadjuvant chemotherapy followed by lumpectomy and radiation for HER-2 positive breast cancer. She has been in remission until a recurrence found 05/10/2020. She underwent bilaterally mastectomies. She presents to the clinic today for follow-up. Patient had right knee replacement surgery and subsequently became anemic and was found to be iron deficient she was started on oral iron therapy but she has not been able to take twice a day iron pills because of constipation.  ALLERGIES:  is allergic to succinylcholine, keflex [  cephalexin], and sulfa antibiotics.  MEDICATIONS:   Current Outpatient Medications  Medication Sig Dispense Refill   amoxicillin (AMOXIL) 500 MG tablet Take 2 tabs by mouth one hour before dentist appointment then 2 tabs six hours after 4 tablet 0   albuterol (VENTOLIN HFA) 108 (90 Base) MCG/ACT inhaler Inhale 1 puff into the lungs every 6 (six) hours as needed for wheezing or shortness of breath.     amLODipine (NORVASC) 5 MG tablet Take 5 mg by mouth daily.     anastrozole (ARIMIDEX) 1 MG tablet Take 1 tablet (1 mg total) by mouth daily. (Patient taking differently: Take 1 mg by mouth at bedtime.) 90 tablet 3   CALCIUM CITRATE PO Take 2 tablets by mouth daily. Zinc  Copper Vit d  Manganese Sodium     Cholecalciferol (VITAMIN D-3) 25 MCG (1000 UT) CAPS Take 1,000 Units by mouth daily.     DULoxetine (CYMBALTA) 30 MG capsule Take 30 mg by mouth See admin instructions. Take with 60  mg for a total 90 mg in the morning     DULoxetine (CYMBALTA) 60 MG capsule Take 60 mg by mouth See admin instructions. Take with 30 mg for a total of 90 mg daily     ELIQUIS 5 MG TABS tablet TAKE 1 TABLET(5 MG) BY MOUTH TWICE DAILY 180 tablet 0   fluticasone (FLONASE) 50 MCG/ACT nasal spray Place 2 sprays into both nostrils daily.     gabapentin (NEURONTIN) 800 MG tablet Take 1 tablet (800 mg total) by mouth 2 (two) times daily. (Patient taking differently: Take 600 mg by mouth 3 (three) times daily.)     HYDROcodone-acetaminophen (NORCO/VICODIN) 5-325 MG tablet Take 1-2 tablets by mouth every 6 (six) hours as needed for moderate pain. 30 tablet 0   ipratropium (ATROVENT) 0.03 % nasal spray Place 2 sprays into both nostrils 2 (two) times daily.     Lactobacillus Rhamnosus, GG, (CULTURELLE PO) Take 1 capsule by mouth daily.     levothyroxine (SYNTHROID) 50 MCG tablet Take 50 mcg by mouth every morning.     metFORMIN (GLUCOPHAGE) 500 MG tablet Take 500 mg by mouth 2 (two) times daily.     methocarbamol (ROBAXIN) 500 MG tablet Take 1 tablet (500 mg total) by mouth  every 6 (six) hours as needed for muscle spasms. 40 tablet 1   oxyCODONE (OXY IR/ROXICODONE) 5 MG immediate release tablet Take 1-2 tablets (5-10 mg total) by mouth every 6 (six) hours as needed for moderate pain (pain score 4-6). 30 tablet 0   pantoprazole (PROTONIX) 40 MG tablet Take 1 tablet (40 mg total) by mouth daily.     Scar Treatment Products [facility] ADVANCED SCAR GEL EX) Apply 1 application topically daily.     sennosides-docusate sodium (SENOKOT-S) 8.6-50 MG tablet Take 2 tablets by mouth daily.     valACYclovir (VALTREX) 500 MG tablet Take 1 tablet (500 mg total) by mouth daily. 30 tablet 12   No current facility-administered medications for this visit.    PHYSICAL EXAMINATION: ECOG PERFORMANCE STATUS: 1 - Symptomatic but completely ambulatory  There were no vitals filed for this visit. There were no vitals filed for this visit.  BREAST: No palpable masses or nodules in either right or left breasts. No palpable axillary supraclavicular or infraclavicular adenopathy no breast tenderness or nipple discharge. (exam performed in the presence of a chaperone)  LABORATORY DATA:  I have reviewed the data as listed CMP Latest Ref Rng & Units 11/20/2020 11/16/2020 11/15/2020  Glucose 70 - 99 mg/dL 217(H) 106(H) 130(H)  BUN 6 - 20 mg/dL _0 Creatinine 0.44 - 1.00 mg/dL 0.87 0.63 0.73  Sodium 135 - 145 mmol/L 133(L) 139 138  Potassium 3.5 - 5.1 mmol/L 4.5 3.7 3.8  Chloride 98 - 111 mmol/L 100 103 101  CO2 22 - 32 mmol/L _1 Calcium 8.9 - 10.3 mg/dL 8.5(L) 9.5 9.6  Total Protein 6.5 - 8.1 g/dL - 7.3 -  Total Bilirubin 0.3 - 1.2 mg/dL - 0.2(L) -  Alkaline Phos 38 - 126 U/L - 77 -  AST 15 - 41 U/L - 18 -  ALT 0 - 44 U/L - 17 -    Lab Results  Component Value Date   WBC 8.3 11/21/2020   HGB 7.1 (L) 11/21/2020   HCT 22.4 (L) 11/21/2020   MCV 81.8 11/21/2020   PLT 169 11/21/2020    ASSESSMENT & PLAN:  Malignant neoplasm of upper-outer quadrant of right breast in  female, estrogen receptor negative ([place]) 08/27/2012: Right breast: Stage Ia IDC with DCIS T2 N0 M0 ER negative PR negative, HER-2 positive, Ki-67 20 to 25% status post neoadjuvant TCHP: Complete pathologic response, adjuvant radiation   05/10/2020: Recurrence/relapse: Right breast: 2.1 cm non-mass enhancement: Biopsy grade 2 invasive lobular cancer ER/PR positive HER-2 equivocal by IHC FISH pending, Ki-67 1% Left breast: 10.5 cm non-mass enhancement anterior biopsy fibrocystic change, posterior biopsy ALH/LCIS   Treatment plan: 1.   07/08/2020 Bilateral mastectomies with reconstruction: Right mastectomy: Grade 2 ILC 0.9 cm, ALH, margins negative, 0/1 lymph, left mastectomy: Benign 2. adjuvant antiestrogen therapy with anastrozole 1 mg daily x7 years started July 2022 Genetic testing ------------------------------------------------------------ Current treatment: Adjuvant antiestrogen therapy with anastrozole 1 mg daily x7 years started July 2022 Anastrozole Toxicities: Denies any major side effects to anastrozole therapy.  We might consider doing a breast MRI every 3 years for implant integrity.   Breast examination: 01/23/2021: Benign  New onset of iron deficiency anemia: Secondary to recent right hip replacement surgery November 19, 2020.  Her hemoglobin went from 10.4 down to 7.7.  She was put on oral iron therapy.  Her hemoglobin improved to 10.4 but the iron studies showed profound iron deficiency anemia with a TIBC of 515. She has profound constipation and does not wish to take twice a day iron therapy.  She has been only able to take once a day. Therefore we recommend that we give her intravenous iron therapy and she can stop her iron pills.  Complains of palpitation and some chest tightness: I will refer her to Dr. Haroldine Laws for thorough evaluation.  Apparently she also has mitral valve prolapse.  We will recheck her labs 1 month after the iron infusion and I will do a telephone visit after  that to discuss results.   No orders of the defined types were placed in this encounter.  The patient has a good understanding of the overall plan. she agrees with it. she will call with any problems that may develop before the next visit here.  Total time spent: 20 mins including face to face time and time spent for planning, charting and coordination of care  Rulon Eisenmenger, MD, MPH 01/23/2021  I, Thana Ates, am acting as scribe for Dr. Nicholas Lose.  I have reviewed the above documentation for accuracy and completeness, and I agree with the above.

## 2021-01-22 NOTE — Assessment & Plan Note (Signed)
08/27/2012: Right breast: Stage Ia IDC with DCIS T2 N0 M0 ER negative PR negative, HER-2 positive, Ki-67 20 to 25% status post neoadjuvant TCHP: Complete pathologic response, adjuvant radiation °  °05/10/2020: Recurrence/relapse: Right breast: 2.1 cm non-mass enhancement: Biopsy grade 2 invasive lobular cancer ER/PR positive HER-2 equivocal by IHC FISH pending, Ki-67 1% °Left breast: 10.5 cm non-mass enhancement anterior biopsy fibrocystic change, posterior biopsy ALH/LCIS °  °Treatment plan: °1.   07/08/2020 Bilateral mastectomies with reconstruction: Right mastectomy: Grade 2 ILC 0.9 cm, ALH, margins negative, 0/1 lymph, left mastectomy: Benign °2. adjuvant antiestrogen therapy with anastrozole 1 mg daily x7 years °Genetic testing °------------------------------------------------------------ °Current treatment: Adjuvant antiestrogen therapy with anastrozole 1 mg daily x7 years °Anastrozole Toxicities: ° °We might consider doing a breast MRI every 3 years for implant integrity. °  °Return to clinic in 1 year. °  °

## 2021-01-23 ENCOUNTER — Other Ambulatory Visit: Payer: Self-pay

## 2021-01-23 ENCOUNTER — Inpatient Hospital Stay: Payer: 59 | Attending: Hematology and Oncology | Admitting: Hematology and Oncology

## 2021-01-23 DIAGNOSIS — Z171 Estrogen receptor negative status [ER-]: Secondary | ICD-10-CM

## 2021-01-23 DIAGNOSIS — C50411 Malignant neoplasm of upper-outer quadrant of right female breast: Secondary | ICD-10-CM | POA: Diagnosis present

## 2021-01-23 DIAGNOSIS — D508 Other iron deficiency anemias: Secondary | ICD-10-CM | POA: Insufficient documentation

## 2021-01-23 DIAGNOSIS — R0789 Other chest pain: Secondary | ICD-10-CM | POA: Insufficient documentation

## 2021-01-23 DIAGNOSIS — D509 Iron deficiency anemia, unspecified: Secondary | ICD-10-CM | POA: Diagnosis not present

## 2021-01-23 DIAGNOSIS — R002 Palpitations: Secondary | ICD-10-CM | POA: Insufficient documentation

## 2021-01-23 DIAGNOSIS — I341 Nonrheumatic mitral (valve) prolapse: Secondary | ICD-10-CM | POA: Diagnosis not present

## 2021-01-23 MED ORDER — FERROUS GLUCONATE 324 (38 FE) MG PO TABS: 324.0000 mg | ORAL_TABLET | Freq: Every day | ORAL | 3 refills | Status: DC

## 2021-01-24 ENCOUNTER — Telehealth (HOSPITAL_COMMUNITY): Payer: Self-pay | Admitting: Internal Medicine

## 2021-01-24 NOTE — Telephone Encounter (Signed)
Dr office called to f/u w/stat referral, pt is having chest pains, and heart palpation, and has been advised to go to the ER if worsen, please advice

## 2021-01-25 ENCOUNTER — Inpatient Hospital Stay: Payer: 59

## 2021-01-25 ENCOUNTER — Other Ambulatory Visit: Payer: Self-pay

## 2021-01-25 VITALS — BP 126/76 | HR 80 | Temp 99.0°F | Resp 18

## 2021-01-25 DIAGNOSIS — C50411 Malignant neoplasm of upper-outer quadrant of right female breast: Secondary | ICD-10-CM | POA: Diagnosis not present

## 2021-01-25 DIAGNOSIS — D509 Iron deficiency anemia, unspecified: Secondary | ICD-10-CM

## 2021-01-25 MED ORDER — SODIUM CHLORIDE 0.9 % IV SOLN: Freq: Once | INTRAVENOUS | Status: AC

## 2021-01-25 MED ORDER — SODIUM CHLORIDE 0.9 % IV SOLN
300.0000 mg | Freq: Once | INTRAVENOUS | Status: AC
  Administered 2021-01-25: 09:00:00 300 mg via INTRAVENOUS
  Filled 2021-01-25: qty 300

## 2021-01-25 NOTE — Patient Instructions (Signed)

## 2021-01-28 ENCOUNTER — Telehealth: Payer: Self-pay | Admitting: *Deleted

## 2021-01-28 ENCOUNTER — Other Ambulatory Visit: Payer: Self-pay | Admitting: Hematology and Oncology

## 2021-01-28 NOTE — Telephone Encounter (Signed)
Contacted Cardiology to f/u on referral made 01/23/21:  Per Nira Conn - patient was contacted and scheduled for appt with Dr. Haroldine Laws on 02/05/21 at 2p

## 2021-02-01 ENCOUNTER — Other Ambulatory Visit: Payer: Self-pay

## 2021-02-01 ENCOUNTER — Inpatient Hospital Stay: Payer: 59

## 2021-02-01 VITALS — BP 147/65 | HR 73 | Temp 98.9°F | Resp 16

## 2021-02-01 DIAGNOSIS — C50411 Malignant neoplasm of upper-outer quadrant of right female breast: Secondary | ICD-10-CM | POA: Diagnosis not present

## 2021-02-01 DIAGNOSIS — D509 Iron deficiency anemia, unspecified: Secondary | ICD-10-CM

## 2021-02-01 MED ORDER — SODIUM CHLORIDE 0.9 % IV SOLN
300.0000 mg | Freq: Once | INTRAVENOUS | Status: AC
  Administered 2021-02-01: 300 mg via INTRAVENOUS
  Filled 2021-02-01: qty 300

## 2021-02-01 MED ORDER — SODIUM CHLORIDE 0.9 % IV SOLN: Freq: Once | INTRAVENOUS | Status: AC

## 2021-02-05 ENCOUNTER — Encounter (HOSPITAL_COMMUNITY): Payer: Self-pay | Admitting: Cardiology

## 2021-02-05 ENCOUNTER — Other Ambulatory Visit: Payer: Self-pay

## 2021-02-05 ENCOUNTER — Encounter (HOSPITAL_COMMUNITY): Payer: Self-pay | Admitting: Internal Medicine

## 2021-02-05 ENCOUNTER — Ambulatory Visit (HOSPITAL_COMMUNITY)
Admission: RE | Admit: 2021-02-05 | Discharge: 2021-02-05 | Disposition: A | Discharge status: Home / Self Care | Payer: 59 | Source: Ambulatory Visit | Attending: Internal Medicine | Admitting: Internal Medicine

## 2021-02-05 VITALS — BP 114/76 | HR 92 | Wt 234.8 lb

## 2021-02-05 DIAGNOSIS — I10 Essential (primary) hypertension: Secondary | ICD-10-CM | POA: Insufficient documentation

## 2021-02-05 DIAGNOSIS — R079 Chest pain, unspecified: Secondary | ICD-10-CM | POA: Diagnosis not present

## 2021-02-05 DIAGNOSIS — D509 Iron deficiency anemia, unspecified: Secondary | ICD-10-CM | POA: Diagnosis not present

## 2021-02-05 DIAGNOSIS — E669 Obesity, unspecified: Secondary | ICD-10-CM | POA: Insufficient documentation

## 2021-02-05 DIAGNOSIS — Z9221 Personal history of antineoplastic chemotherapy: Secondary | ICD-10-CM | POA: Diagnosis not present

## 2021-02-05 DIAGNOSIS — Z91199 Patient's noncompliance with other medical treatment and regimen due to unspecified reason: Secondary | ICD-10-CM | POA: Diagnosis not present

## 2021-02-05 DIAGNOSIS — C50411 Malignant neoplasm of upper-outer quadrant of right female breast: Secondary | ICD-10-CM | POA: Diagnosis not present

## 2021-02-05 DIAGNOSIS — Z923 Personal history of irradiation: Secondary | ICD-10-CM | POA: Insufficient documentation

## 2021-02-05 DIAGNOSIS — I341 Nonrheumatic mitral (valve) prolapse: Secondary | ICD-10-CM | POA: Diagnosis not present

## 2021-02-05 DIAGNOSIS — R002 Palpitations: Secondary | ICD-10-CM | POA: Diagnosis not present

## 2021-02-05 DIAGNOSIS — Z79811 Long term (current) use of aromatase inhibitors: Secondary | ICD-10-CM | POA: Diagnosis not present

## 2021-02-05 DIAGNOSIS — E039 Hypothyroidism, unspecified: Secondary | ICD-10-CM | POA: Diagnosis not present

## 2021-02-05 DIAGNOSIS — G4733 Obstructive sleep apnea (adult) (pediatric): Secondary | ICD-10-CM | POA: Diagnosis not present

## 2021-02-05 DIAGNOSIS — E119 Type 2 diabetes mellitus without complications: Secondary | ICD-10-CM | POA: Insufficient documentation

## 2021-02-05 DIAGNOSIS — Z96649 Presence of unspecified artificial hip joint: Secondary | ICD-10-CM | POA: Insufficient documentation

## 2021-02-05 DIAGNOSIS — Z853 Personal history of malignant neoplasm of breast: Secondary | ICD-10-CM | POA: Insufficient documentation

## 2021-02-05 DIAGNOSIS — F419 Anxiety disorder, unspecified: Secondary | ICD-10-CM | POA: Diagnosis not present

## 2021-02-05 DIAGNOSIS — Z171 Estrogen receptor negative status [ER-]: Secondary | ICD-10-CM

## 2021-02-05 DIAGNOSIS — Z9013 Acquired absence of bilateral breasts and nipples: Secondary | ICD-10-CM | POA: Diagnosis not present

## 2021-02-05 DIAGNOSIS — Z86718 Personal history of other venous thrombosis and embolism: Secondary | ICD-10-CM | POA: Diagnosis not present

## 2021-02-05 DIAGNOSIS — R0789 Other chest pain: Secondary | ICD-10-CM | POA: Insufficient documentation

## 2021-02-05 DIAGNOSIS — Z8616 Personal history of COVID-19: Secondary | ICD-10-CM | POA: Insufficient documentation

## 2021-02-05 DIAGNOSIS — Z01818 Encounter for other preprocedural examination: Secondary | ICD-10-CM | POA: Insufficient documentation

## 2021-02-05 NOTE — Progress Notes (Signed)
CARDIO-ONCOLOGY CLINIC CONSULT NOTE  Referring Physician: Primary Care: Wenda Low, MD   HPI:  Sierra Perez is 61 y.o. 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 Lindi Adie for further evaluation of CP and palpitations.   Diagnosed with R breast cancer in 2014. ER/PR negative. HER2+. Completed neoadjuvant Taxotere/Carboplatin/Herceptin/Perjeta followed by adjuvant Herceptin maintenance x 1 year. R lumpectomy 1/15 followed by XRT.  In 4/22 found to have second tumor in R breast.(atypical lobular hyperplasia/LCIS) ER/PR + HER2 equivocal. Underwent bilateral mastectomies with reconstruction on 07/08/20. Being treated with adjuvant antiestrogen therapy with anastrozole 1 mg daily x7 years started July 2022  Reports h/o MVP but no other known cardiac issues. Being treated for Fe-deficiency anemia. Echo in 2015 EF 57% GLS -22%  Had COVID in 9/22. Was fairly sick for 2 weeks but not hospitalized. Then had hip replacement in October. Post-op course c/b by Fe-deficient anemia. Getting IV iron. Hgb 10/22 was 7.1. Thinks in up around 10 now.   Now starting to be more active but still SOB with mild activity like normal walking. Resting HR was in the 60s and now in hgih 90s. Occasional skip beats and palpitations. Also notes that she can now hear her heartbeat in her ear. Has not worn CPAP regularly. Occasional central chest pressure. Gets it a couple times per week. Last 15 mins and resolves. Not related to exertion. Last stress test 20 years ago for CP. No diet supplements.    Review of Systems: [y] = yes, [ ]  = no   General: Weight gain [ ] ; Weight loss [ ] ; Anorexia [ ] ; Fatigue Blue.Reese ]; Fever [ ] ; Chills [ ] ; Weakness [ ]   Cardiac: Chest pain/pressure [ ] ; Resting SOB [ ] ; Exertional SOB Blue.Reese ]; Orthopnea [ ] ; Pedal Edema [ ] ; Palpitations [ ] ; Syncope [ ] ; Presyncope [ ] ; Paroxysmal nocturnal dyspnea[ ]   Pulmonary: Cough [ ] ; Wheezing[ ] ;  Hemoptysis[ ] ; Sputum [ ] ; Snoring [ y]  GI: Vomiting[ ] ; Dysphagia[ ] ; Melena[ ] ; Hematochezia [ ] ; Heartburn[ ] ; Abdominal pain [ ] ; Constipation [ ] ; Diarrhea [ ] ; BRBPR [ ]   GU: Hematuria[ ] ; Dysuria [ ] ; Nocturia[ ]   Vascular: Pain in legs with walking [ ] ; Pain in feet with lying flat [ ] ; Non-healing sores [ ] ; Stroke [ ] ; TIA [ ] ; Slurred speech [ ] ;  Neuro: Headaches[ ] ; Vertigo[ ] ; Seizures[ ] ; Paresthesias[ ] ;Blurred vision [ ] ; Diplopia [ ] ; Vision changes [ ]   Ortho/Skin: Arthritis [ ] ; Joint pain [ ] ; Muscle pain [ ] ; Joint swelling [ ] ; Back Pain [ ] ; Rash [ ]   Psych: Depression[ ] ; Anxiety[ y]  Heme: Bleeding problems [ ] ; Clotting disorders [ ] ; Anemia [ ]   Endocrine: Diabetes [ y]; Thyroid dysfunction[ ]    Past Medical History:  Diagnosis Date   Anemia    Anxiety    Asthma    Breast cancer ([place]) 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, her-2 positive, ER/PR negative   Clotting disorder ([place])    testing was negative, h/o DVT while on treatment   Complex regional pain syndrome i of right lower limb    RDS   Diabetes mellitus without complication ([place])    DVT (deep venous thrombosis) ([place])    started in 2015, multiple   Family history of breast cancer    Family history of colon cancer    Fibroid    GERD (gastroesophageal reflux disease)    Heart  murmur    HPV in female    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     amLODipine (NORVASC) 5 MG tablet Take 5 mg by mouth daily.     anastrozole (ARIMIDEX) 1 MG tablet Take 1 tablet (1 mg total) by mouth daily. (Patient taking differently:  Take 1 mg by mouth at bedtime.) 90 tablet 3   CALCIUM CITRATE PO Take 2 tablets by mouth daily. Zinc  Copper Vit d  Manganese Sodium     Cholecalciferol (VITAMIN D-3) 25 MCG (1000 UT) CAPS Take 1,000 Units by mouth daily.     DULoxetine (CYMBALTA) 30 MG capsule 30 mg daily.     DULoxetine (CYMBALTA) 60 MG capsule Take 60 mg by mouth daily.     ELIQUIS 5 MG TABS tablet TAKE 1 TABLET(5 MG) BY MOUTH TWICE DAILY 180 tablet 0   fluticasone (FLONASE) 50 MCG/ACT nasal spray Place 2 sprays into both nostrils daily.     gabapentin (NEURONTIN) 600 MG tablet Take 600 mg by mouth 2 (two) times daily.     ipratropium (ATROVENT) 0.03 % nasal spray Place 2 sprays into both nostrils 2 (two) times daily.     Lactobacillus Rhamnosus, GG, (CULTURELLE PO) Take 1 capsule by mouth daily.     levothyroxine (SYNTHROID) 50 MCG tablet Take 50 mcg by mouth every morning.     metFORMIN (GLUCOPHAGE) 500 MG tablet Take 500 mg by mouth 2 (two) times daily.     pantoprazole (PROTONIX) 40 MG tablet Take 1 tablet (40 mg total) by mouth daily.     Scar Treatment Products [facility] ADVANCED SCAR GEL EX) Apply 1 application topically daily.     sennosides-docusate sodium (SENOKOT-S) 8.6-50 MG tablet Take 2 tablets by mouth daily.     valACYclovir (VALTREX) 500 MG tablet Take 1 tablet (500 mg total) by mouth daily. 30 tablet 12   No current facility-administered medications for this encounter.    Allergies  Allergen Reactions   Succinylcholine Other (See Comments)    Muscle aching  18 years ago from 07/02/20   Keflex [Cephalexin] Diarrhea and Nausea And Vomiting   Sulfa Antibiotics Rash    And swelling generalized not in throat      Social History   Socioeconomic History   Marital status: Single    Spouse name: Not on file   Number of children: Not on file   Years of education: Not on file   Highest education level: Not on file  Occupational History   Not on file  Tobacco Use   Smoking status: Never    Smokeless tobacco: Never  Vaping Use   Vaping Use: Never used  Substance and Sexual Activity   Alcohol use: Yes    Comment: occ   Drug use: Not Currently    Types: Marijuana   Sexual activity: Not Currently    Birth control/protection: Surgical    Comment: Hysterectomy  Other Topics Concern   Not on file  Social History Narrative   Not on file   Social  Determinants of Health   Financial Resource Strain: Not on file  Food Insecurity: Not on file  Transportation Needs: Not on file  Physical Activity: Not on file  Stress: Not on file  Social Connections: Not on file  Intimate Partner Violence: Not on file      Family History  Problem Relation Age of Onset   Hypertension Mother    Skin cancer Mother    Diabetes Father    Kidney disease Father    Dementia Father    Factor VIII deficiency Father    Bladder Cancer Maternal Grandmother        dx in late 45s; smoker   Stroke Maternal Grandmother    Lung cancer Maternal Grandfather    Colon cancer Maternal Grandfather 72   Thyroid cancer Maternal Grandfather        dx in his 40s   Breast cancer Paternal Grandmother        dx in 30s   Prostate cancer Other        MGMs brother    Vitals:   02/05/21 1414  BP: 114/76  Pulse: 92  SpO2: 97%  Weight: 106.5 kg (234 lb 12.8 oz)    PHYSICAL EXAM: General:  Well appearing. No respiratory difficulty HEENT: normal Neck: supple. no JVD. Carotids 2+ bilat; no bruits. No lymphadenopathy or thryomegaly appreciated. Cor: PMI nondisplaced. Regular rate & rhythm. No rubs, gallops or murmurs. Lungs: clear Abdomen: obese soft, nontender, nondistended. No hepatosplenomegaly. No bruits or masses. Good bowel sounds. Extremities: no cyanosis, clubbing, rash, edema Neuro: alert & oriented x 3, cranial nerves grossly intact. moves all 4 extremities w/o difficulty. Affect pleasant but anxious  ECG: NSR 92 No ST-T wave abnormalities. Personally reviewed  ASSESSMENT & PLAN:  1.  Palpitations - check echo - will place Zio XT to further evaluate  2. Chest pressure - has typical and atypical features with multiple CRFs - will check cardiac CTA  3. R Breast Cancer - diagnosed 2014. ER/PR negative. HER2+. Completed neoadjuvant Taxotere/Carboplatin/Herceptin/Perjeta followed by adjuvant Herceptin maintenance x 1 year. R lumpectomy 1/15 followed by XRT. - 4/22 found to have relapse/recurrence in R breast.(atypical lobular hyperplasia/LCIS) ER/PR + HER2 equivocal. Underwent bilateral mastectomies with reconstruction on 07/08/20. Being treated with adjuvant antiestrogen therapy with anastrozole 1 mg daily x7 years started July 2022  4. Obesity - with h/o DM2 would strongly consider GLP-1RA   Glori Bickers, MD  3:06 PM

## 2021-02-05 NOTE — Patient Instructions (Addendum)
After visit summary sent via My Chart  It was great to see you today! No medication changes are needed at this time.  Your provider has recommended that  you wear a Zio Patch for 14 days.  This monitor will record your heart rhythm for our review.  IF you have any symptoms while wearing the monitor please press the button.  If you have any issues with the patch or you notice a red or orange light on it please call the company at [phone].  Once you remove the patch please mail it back to the company as soon as possible so we can get the results.  Your physician has requested that you have cardiac CT. Cardiac computed tomography (CT) is a painless test that uses an x-ray machine to take clear, detailed pictures of your heart. For further information please visit [email or web address]. Please follow instruction sheet as given. -Scheduling will be in contact with an appointment  Your physician has requested that you have an echocardiogram. Echocardiography is a painless test that uses sound waves to create images of your heart. It provides your doctor with information about the size and shape of your heart and how well your hearts chambers and valves are working. This procedure takes approximately one hour. There are no restrictions for this procedure. -scheduling will be in contact with an appointment  Your physician recommends that you schedule a follow-up appointment in: 3 months with Dr Haroldine Laws -scheduling will be in contact with an appointment   At the [facility], you and your health needs are our priority. As part of our continuing mission to provide you with exceptional heart care, we have created designated Provider Care Teams. These Care Teams include your primary Cardiologist (physician) and Advanced Practice Providers (APPs- Physician Assistants and Nurse Practitioners) who all work together to provide you with the care you need, when you need it.   You may see  any of the following providers on your designated Care Team at your next follow up: Dr Glori Bickers Dr Haynes Kerns, NP Lyda Jester, [place] [facility] [place], [place] Audry Riles, PharmD   Please be sure to bring in all your medications bottles to every appointment.     If you have any questions or concerns before your next appointment please send [place] a message through [place] or call our office at [phone].    TO LEAVE A MESSAGE FOR THE NURSE SELECT OPTION 2, PLEASE LEAVE A MESSAGE INCLUDING: YOUR NAME DATE OF BIRTH CALL BACK NUMBER REASON FOR CALL**this is important as we prioritize the call backs  YOU WILL RECEIVE A CALL BACK THE SAME DAY AS LONG AS YOU CALL BEFORE 4:00 PM

## 2021-02-07 MED FILL — Iron Sucrose Inj 20 MG/ML (Fe Equiv): INTRAVENOUS | Qty: 15 | Status: AC

## 2021-02-08 ENCOUNTER — Other Ambulatory Visit: Payer: Self-pay

## 2021-02-08 ENCOUNTER — Inpatient Hospital Stay: Payer: 59 | Attending: Hematology and Oncology

## 2021-02-08 VITALS — BP 129/71 | HR 76 | Temp 98.9°F | Resp 16

## 2021-02-08 DIAGNOSIS — D509 Iron deficiency anemia, unspecified: Secondary | ICD-10-CM | POA: Insufficient documentation

## 2021-02-08 DIAGNOSIS — C50411 Malignant neoplasm of upper-outer quadrant of right female breast: Secondary | ICD-10-CM | POA: Diagnosis present

## 2021-02-08 DIAGNOSIS — Z79899 Other long term (current) drug therapy: Secondary | ICD-10-CM | POA: Diagnosis not present

## 2021-02-08 MED ORDER — SODIUM CHLORIDE 0.9 % IV SOLN
300.0000 mg | Freq: Once | INTRAVENOUS | Status: AC
  Administered 2021-02-08: 300 mg via INTRAVENOUS
  Filled 2021-02-08: qty 300

## 2021-02-08 MED ORDER — SODIUM CHLORIDE 0.9 % IV SOLN: Freq: Once | INTRAVENOUS | Status: AC

## 2021-02-08 NOTE — Progress Notes (Signed)
Patient tolerated IV iron infusion well, stayed for 30 minute post infusion wait with no complaints. VSS and ambulatory to lobby.

## 2021-02-08 NOTE — Patient Instructions (Signed)

## 2021-02-10 ENCOUNTER — Other Ambulatory Visit: Payer: Self-pay

## 2021-02-10 ENCOUNTER — Ambulatory Visit: Payer: 59

## 2021-02-10 NOTE — Therapy (Signed)
[facility] @ [place], [place], [address] Phone: [phone]   Fax:  [phone]  Physical Therapy Treatment  Patient Details  Name: Sierra Perez MRN: [id number] Date of Birth: September 11, 1960 Referring Provider (PT): Dr. 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, her-2 positive, ER/PR negative   Clotting disorder ([place])    testing was negative, h/o DVT while on treatment   Complex regional pain syndrome i of right lower limb    RDS   Diabetes mellitus without complication ([place])    DVT (deep venous thrombosis) ([place])    started in 2015, multiple   Family history of breast cancer    Family history of colon cancer    Fibroid    GERD (gastroesophageal reflux disease)    Heart murmur    HPV in female    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   Hypertension    Hypothyroidism    IBS (irritable bowel syndrome)    Mitral valve prolapse    Personal history of malignant neoplasm of breast    Thyroid disease     Past Surgical History:  Procedure Laterality Date   ABDOMINAL HYSTERECTOMY  2010   fibroids, h/o abnormal pap smears   AXILLARY SENTINEL NODE BIOPSY Right 07/08/2020   Procedure: RIGHT AXILLARY SENTINEL NODE BIOPSY;  Surgeon: Rolm Bookbinder, MD;  Location: [facility];  Service: General;  Laterality: Right;   BREAST RECONSTRUCTION WITH PLACEMENT OF TISSUE EXPANDER AND FLEX HD (ACELLULAR HYDRATED DERMIS) Bilateral 07/08/2020   Procedure: BILATERAL BREAST  RECONSTRUCTION WITH  FLEX HD (ACELLULAR HYDRATED DERMIS),DIRECT IMPLANT RECONSTRUCTION;  Surgeon: Cindra Presume, MD;  Location: [facility];  Service: Plastics;  Laterality: Bilateral;   BREAST SURGERY Right 2015   right lumpectomy with reduction and lift   COLONOSCOPY     DENTAL SURGERY     ESOPHAGOGASTRODUODENOSCOPY     FOOT SURGERY     dislocated toe   LAPAROSCOPIC GASTRIC SLEEVE RESECTION  2014   LAPAROSCOPIC OOPHERECTOMY Bilateral 2016   REPLACEMENT TOTAL KNEE Left 2018   also had 3 prior arthroscopies   TOTAL HIP ARTHROPLASTY Right 11/19/2020   Procedure: RIGHT TOTAL HIP ARTHROPLASTY ANTERIOR APPROACH;  Surgeon: Mcarthur Rossetti, MD;  Location: [facility];  Service: Orthopedics;  Laterality: Right;   TOTAL MASTECTOMY Bilateral 07/08/2020   Procedure: BILATERAL TOTAL MASTECTOMY;  Surgeon: Rolm Bookbinder, MD;  Location: [facility];  Service: General;  Laterality: Bilateral;   VENA CAVA FILTER PLACEMENT  2017    There were no vitals filed for this visit.   Subjective Assessment - 02/10/21 0823     Subjective Pt returns for her 3 month SOZO. "I have a heart monitor on until Jan 19."    Pertinent History 2014 Rt breast cancer with lumpectomy with SLNB, radiation, and chemotherapy. Recurrence with bil mastectomy with  SLNB on 07/08/20 due to grade 2 ILC ER/PR positive.  Other history includes clotting disorder and DVT history, diabetes                                             PT Long Term Goals - 10/01/20 0858       PT LONG TERM GOAL #1   Title Pt will return to close to baseline shoulder AROM to demonstrate no additional need for PT    Time 12    Period Weeks    Status Achieved      PT LONG TERM GOAL #2   Title Pt will be educated on lymphedema risk reduction and sleeve use    Time 12    Period Weeks    Status Achieved      PT LONG TERM GOAL #3   Title Pt will be scheduled for SOZO surveillance    Time 12    Period Weeks    Status Achieved       PT LONG TERM GOAL #4   Title Pt will be independent in Strength ABC program for long term strengthening and stretching    Time 4    Period Weeks    Status Achieved      PT LONG TERM GOAL #5   Title Pt will demonstrate a decrease in edema in R breast as evidenced by decreased pore size.    Baseline 10/01/20- pore size is decreased especially at lateral aspect    Time 4    Period Weeks    Status Achieved      PT LONG TERM GOAL #6   Title Pt will be independent in self MLD for long term management of edema.    Time 4    Period Weeks    Status Achieved                   Plan - 02/10/21 0824     Clinical Impression Statement Pt comes in wearing an external heart monitor that she will have on until Jan 19 and then she will be out of town. Due to having anything electrical on for the SOZO screen it was determined that we need to wait until this is removed. Explained this to pt. she understood and R/S for when she returns from her trip as her heart monitor will be removed by then.    PT Next Visit Plan Resume every 3 month L-Dex screens for up tp 2 years from her SLNB (~07/09/2022)    Consulted and Agree with Plan of Care Patient             Patient will benefit from skilled therapeutic intervention in order to improve the following deficits and impairments:     Visit Diagnosis: Aftercare following surgery for neoplasm     Problem List Patient Active Problem List   Diagnosis Date Noted   Iron deficiency anemia 01/23/2021   Status post right hip replacement 12/03/2020   Status post total replacement of right hip 11/19/2020   Breast cancer ([place]) 07/08/2020   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   H/O total hysterectomy 06/11/2020   Genetic testing 05/27/2020   Family history of breast cancer    Family history of colon cancer    Personal history of malignant neoplasm of breast    Unilateral primary osteoarthritis, right knee 02/26/2020  Unilateral primary  osteoarthritis, 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   DVT (deep venous thrombosis) ([place]) 05/04/2018   Malignant neoplasm of upper-outer quadrant of right breast in female, estrogen receptor negative ([place]) 05/03/2018    Otelia Limes, PTA 02/10/2021, 8:27 AM  [facility] @ [place], [place], [address] Phone: [phone]   Fax:  [phone]  Name: Marissia Blackham MRN: [id number] Date of Birth: September 03, 1960

## 2021-02-24 ENCOUNTER — Encounter (HOSPITAL_COMMUNITY): Payer: Self-pay | Admitting: Internal Medicine

## 2021-02-24 NOTE — Addendum Note (Signed)
Encounter addended by: Scarlette Calico, RN on: 02/24/2021 11:20 AM  Actions taken: Order list changed, Diagnosis association updated, Clinical Note Signed

## 2021-02-27 NOTE — Addendum Note (Signed)
Encounter addended by: Micki Riley, RN on: 02/27/2021 7:43 PM  Actions taken: Imaging Exam ended

## 2021-02-28 ENCOUNTER — Other Ambulatory Visit (HOSPITAL_COMMUNITY): Payer: Self-pay | Admitting: *Deleted

## 2021-02-28 ENCOUNTER — Telehealth (HOSPITAL_COMMUNITY): Payer: Self-pay | Admitting: *Deleted

## 2021-02-28 ENCOUNTER — Encounter (HOSPITAL_COMMUNITY): Payer: Self-pay

## 2021-02-28 DIAGNOSIS — Z01812 Encounter for preprocedural laboratory examination: Secondary | ICD-10-CM

## 2021-02-28 MED ORDER — METOPROLOL TARTRATE 100 MG PO TABS: ORAL_TABLET | ORAL | 0 refills | Status: DC

## 2021-02-28 NOTE — Telephone Encounter (Signed)
Reaching out to patient to offer assistance regarding upcoming cardiac imaging study; pt verbalizes understanding of appt date/time, parking situation and where to check in, pre-test NPO status and medications ordered, and verified current allergies; name and call back number provided for further questions should they arise  Gordy Clement RN Navigator Cardiac Imaging [facility] [phone] office [phone] cell  Patient to take 100mg  metoprolol tartrate two hours prior to cardiac CT Scan. She is aware to obtain blood work prior to scan but is agreeable to getting an istat if she is unable to get to a [facility].  She is aware to arrive at 7:15am for her 7:45am scan. MyChart instructions sent to patient.

## 2021-03-04 ENCOUNTER — Other Ambulatory Visit: Payer: Self-pay

## 2021-03-04 ENCOUNTER — Ambulatory Visit: Payer: 59 | Admitting: Surgical

## 2021-03-04 ENCOUNTER — Ambulatory Visit (HOSPITAL_COMMUNITY)
Admission: RE | Admit: 2021-03-04 | Discharge: 2021-03-04 | Disposition: A | Discharge status: Home / Self Care | Payer: 59 | Source: Ambulatory Visit | Attending: Internal Medicine | Admitting: Internal Medicine

## 2021-03-04 DIAGNOSIS — R079 Chest pain, unspecified: Secondary | ICD-10-CM | POA: Insufficient documentation

## 2021-03-04 DIAGNOSIS — I2510 Atherosclerotic heart disease of native coronary artery without angina pectoris: Secondary | ICD-10-CM

## 2021-03-04 LAB — BASIC METABOLIC PANEL
BUN/Creatinine Ratio: 26 (ref 12–28)
BUN: 17 mg/dL (ref 8–27)
CO2: 25 mmol/L (ref 20–29)
Calcium: 9.9 mg/dL (ref 8.7–10.3)
Chloride: 102 mmol/L (ref 96–106)
Creatinine, Ser: 0.65 mg/dL (ref 0.57–1.00)
Glucose: 129 mg/dL — ABNORMAL HIGH (ref 70–99)
Potassium: 4.3 mmol/L (ref 3.5–5.2)
Sodium: 141 mmol/L (ref 134–144)
eGFR: 101 mL/min/{1.73_m2} (ref >59)

## 2021-03-04 LAB — POCT I-STAT CREATININE: Creatinine, Ser: 0.6 mg/dL (ref 0.44–1.00)

## 2021-03-04 IMAGING — CT CT HEART MORP W/ CTA COR W/ SCORE W/ CA W/CM &/OR W/O CM
4 of 7 series · 8 of 20 positions shown, 9 images · IV contrast (APPLIED)
Comparison: None.
COMPARISON: None.

Addendum:
EXAM:
OVER-READ INTERPRETATION  CT CHEST

The following report is an over-read performed by radiologist Dr.
over-read does not include interpretation of cardiac or coronary
anatomy or pathology. The coronary calcium score/coronary CTA
interpretation by the cardiologist is attached.
CLINICAL DATA: This is a 60 year old female with anginal symptoms.
Cardiac/Coronary  CTA
TECHNIQUE: The patient was scanned on a Phillips Force scanner.

[Series 6: best diast 73 % · axial · 0.43mm/px · z∈[-258,-211]mm · 2 of 353 slices shown, 3 images]
[im 118/353  vessel]
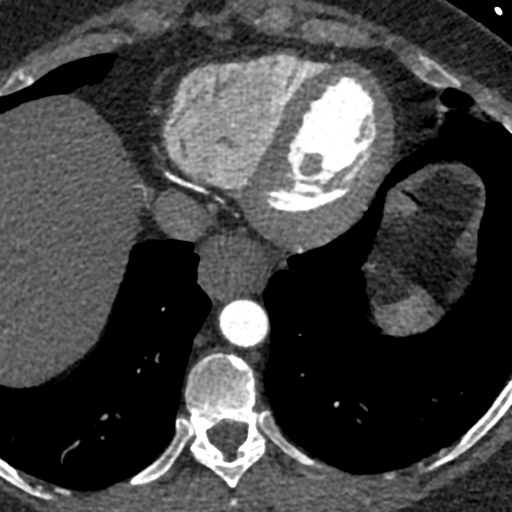
[im 118/353  lung]
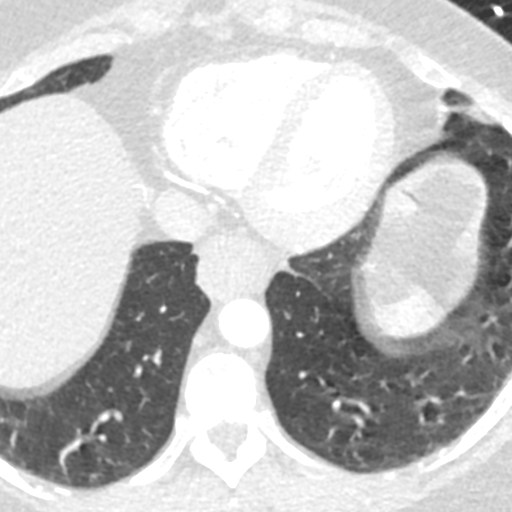
[im 235/353  vessel]
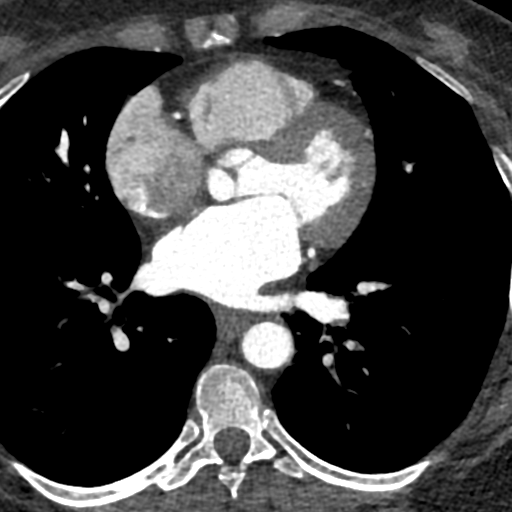

[Series 7: best syst 37 % · axial · 0.43mm/px · z∈[-250,-207]mm · 2 of 321 slices shown]
[im 107/321  vessel]
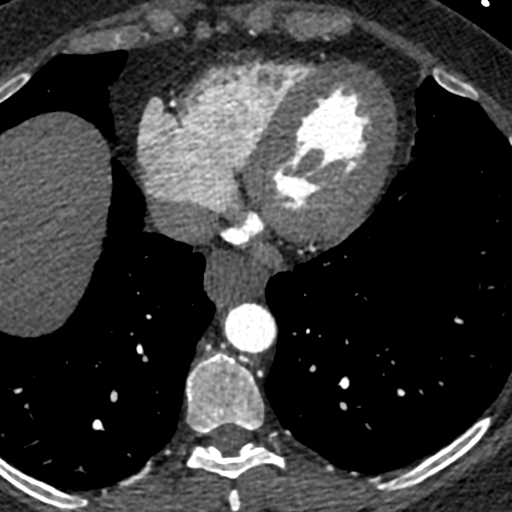
[im 214/321  vessel]
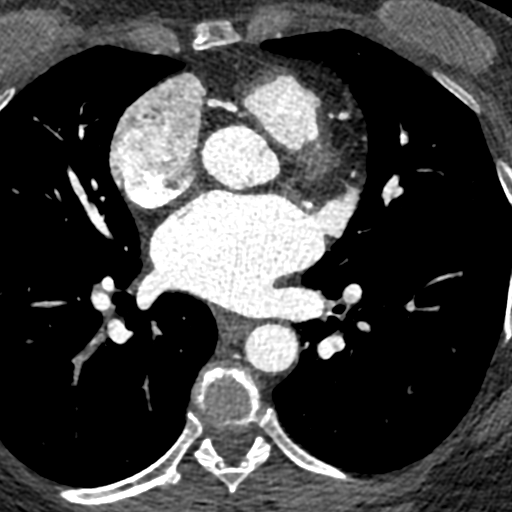

[Series 8: ts diast sharp 73 % · axial · 0.43mm/px · z∈[-250,-207]mm · 2 of 321 slices shown]
[im 107/321  lung]
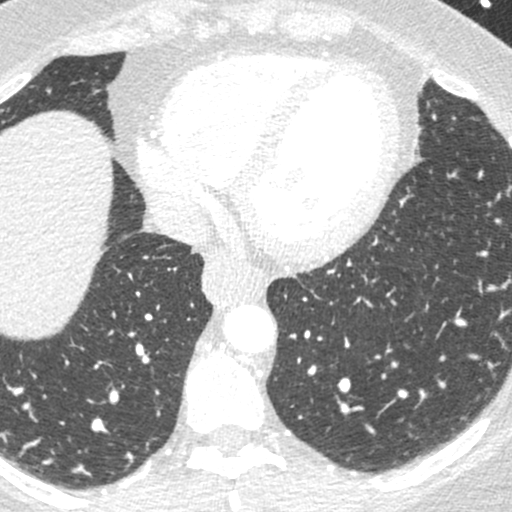
[im 214/321  lung]
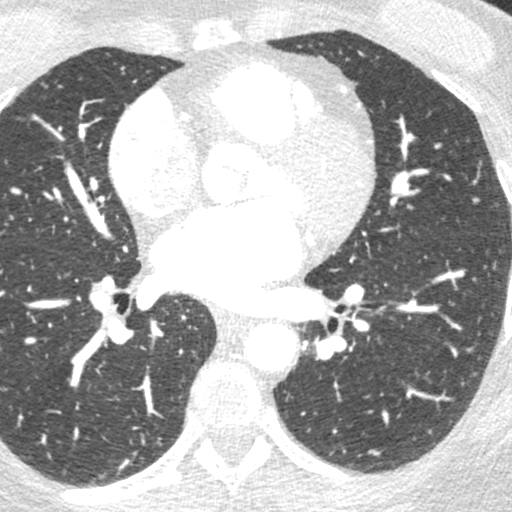

[Series 9: ts syst sharp 37 % · axial · 0.43mm/px · z∈[-250,-207]mm · 2 of 321 slices shown]
[im 107/321  lung]
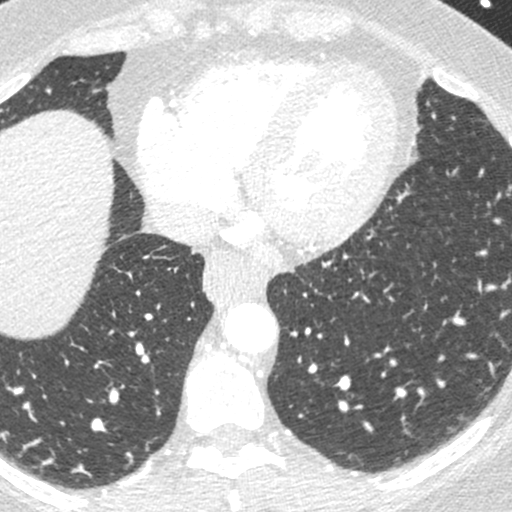
[im 214/321  lung]
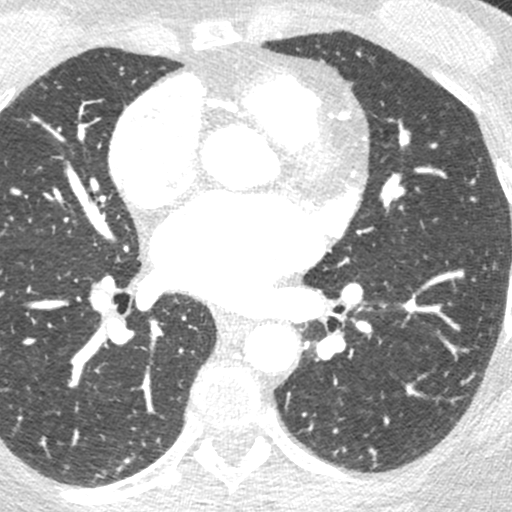

[8 of 20 positions shown; findings below may reference images not displayed]

FINDINGS: Mediastinum/Nodes: Small to moderate size hiatal hernia. No
mediastinal mass or adenopathy identified.

Lungs/Pleura: No pleural effusion. Small nodule within the
anterolateral right lower lobe measures 3 mm, image [DATE]. No
airspace consolidation, atelectasis or pneumothorax.

Upper Abdomen: No acute findings. Postop change from previous
gastric sleeve.

Musculoskeletal: No chest wall mass or suspicious bone lesions
identified.
IMPRESSION: 1. Small to moderate size hiatal hernia.
2. Small nodule within the anterolateral right lower lobe measures 3
mm. No follow-up needed if patient is low-risk. Non-contrast chest
CT can be considered in 12 months if patient is high-risk. This
recommendation follows the consensus statement: Guidelines for
Management of Incidental Pulmonary Nodules Detected on CT Images:
FINDINGS: A 100 kV prospective scan was triggered in the descending thoracic
aorta at 111 HU's. Axial non-contrast 3 mm slices were carried out
through the heart. The data set was analyzed on a dedicated work
station and scored using the Agatson method. Gantry rotation speed
was 250 msecs and collimation was .6 mm. No beta blockade and 0.8 mg
of sl NTG was given. The 3D data set was reconstructed in 5%
intervals of the 67-82 % of the R-R cycle. Diastolic phases were
analyzed on a dedicated work station using MPR, MIP and VRT modes.
The patient received 80 cc of contrast.

Aorta: Normal size.  No calcifications.  No dissection.

Aortic Valve:  Trileaflet.  No calcifications.

Coronary Arteries:  Normal coronary origin.  Right dominance.

RCA is a large dominant artery that gives rise to PDA and PLA. There
is no plaque.

Left main is a large artery that gives rise to LAD and LCX arteries.

LAD is a large vessel. There is a Mild (25-49%) focal calcification
in the proximal portion of the LAD. The mid and distal LAD with no
plaques.

LCX is a non-dominant artery that gives rise to one large OM1
branch. There is no plaque.

Coronary Calcium Score:

Left main: 0

Left anterior descending artery: 26

Left circumflex artery: 0

Right coronary artery: 0

Total: 26

Percentile: 78

Other findings:

Normal pulmonary vein drainage into the left atrium.

Normal left atrial appendage without a thrombus.

Normal size of the pulmonary artery.
IMPRESSION: 1. Coronary calcium score of 26. This was 78 percentile for age and
sex matched control.

2. Normal coronary origin with right dominance.

3. Mild Coronary artery disease. CAD-RADS 2. Mild non-obstructive
CAD (25-49%). Consider non-atherosclerotic causes of chest pain.
Consider preventive therapy and risk factor modification.

*** End of Addendum ***
EXAM:
OVER-READ INTERPRETATION  CT CHEST

The following report is an over-read performed by radiologist Dr.
over-read does not include interpretation of cardiac or coronary
anatomy or pathology. The coronary calcium score/coronary CTA
interpretation by the cardiologist is attached.
FINDINGS: Mediastinum/Nodes: Small to moderate size hiatal hernia. No
mediastinal mass or adenopathy identified.

Lungs/Pleura: No pleural effusion. Small nodule within the
anterolateral right lower lobe measures 3 mm, image [DATE]. No
airspace consolidation, atelectasis or pneumothorax.

Upper Abdomen: No acute findings. Postop change from previous
gastric sleeve.

Musculoskeletal: No chest wall mass or suspicious bone lesions
identified.
IMPRESSION: 1. Small to moderate size hiatal hernia.
2. Small nodule within the anterolateral right lower lobe measures 3
mm. No follow-up needed if patient is low-risk. Non-contrast chest
CT can be considered in 12 months if patient is high-risk. This
recommendation follows the consensus statement: Guidelines for
Management of Incidental Pulmonary Nodules Detected on CT Images:

## 2021-03-04 MED ORDER — IOHEXOL 350 MG/ML SOLN
100.0000 mL | Freq: Once | INTRAVENOUS | Status: AC
  Administered 2021-03-04: 95 mL via INTRAVENOUS

## 2021-03-04 MED ORDER — NITROGLYCERIN 0.4 MG SL SUBL
0.8000 mg | SUBLINGUAL_TABLET | Freq: Once | SUBLINGUAL | Status: AC
  Administered 2021-03-04: 0.8 mg via SUBLINGUAL

## 2021-03-04 MED ORDER — NITROGLYCERIN 0.4 MG SL SUBL
SUBLINGUAL_TABLET | SUBLINGUAL | Status: AC
  Filled 2021-03-04: qty 2

## 2021-03-05 NOTE — Telephone Encounter (Signed)
Patient scheduled for procedures, my chart message not reviewed until triage day 03/05/21

## 2021-03-07 ENCOUNTER — Ambulatory Visit (HOSPITAL_COMMUNITY)
Admission: RE | Admit: 2021-03-07 | Discharge: 2021-03-07 | Disposition: A | Discharge status: Home / Self Care | Payer: 59 | Source: Ambulatory Visit | Attending: Internal Medicine | Admitting: Internal Medicine

## 2021-03-07 ENCOUNTER — Other Ambulatory Visit: Payer: Self-pay

## 2021-03-07 ENCOUNTER — Inpatient Hospital Stay: Payer: 59 | Attending: Hematology and Oncology

## 2021-03-07 DIAGNOSIS — I10 Essential (primary) hypertension: Secondary | ICD-10-CM | POA: Insufficient documentation

## 2021-03-07 DIAGNOSIS — R079 Chest pain, unspecified: Secondary | ICD-10-CM | POA: Insufficient documentation

## 2021-03-07 DIAGNOSIS — Z0189 Encounter for other specified special examinations: Secondary | ICD-10-CM | POA: Diagnosis not present

## 2021-03-07 DIAGNOSIS — E119 Type 2 diabetes mellitus without complications: Secondary | ICD-10-CM | POA: Diagnosis not present

## 2021-03-07 DIAGNOSIS — Z171 Estrogen receptor negative status [ER-]: Secondary | ICD-10-CM

## 2021-03-07 DIAGNOSIS — Z01818 Encounter for other preprocedural examination: Secondary | ICD-10-CM | POA: Insufficient documentation

## 2021-03-07 DIAGNOSIS — C50411 Malignant neoplasm of upper-outer quadrant of right female breast: Secondary | ICD-10-CM | POA: Diagnosis not present

## 2021-03-07 DIAGNOSIS — D509 Iron deficiency anemia, unspecified: Secondary | ICD-10-CM

## 2021-03-07 LAB — CBC WITH DIFFERENTIAL (CANCER CENTER ONLY)
Abs Immature Granulocytes: 0.03 10*3/uL (ref 0.00–0.07)
Basophils Absolute: 0 10*3/uL (ref 0.0–0.1)
Basophils Relative: 1 %
Eosinophils Absolute: 0.1 10*3/uL (ref 0.0–0.5)
Eosinophils Relative: 2 %
HCT: 39.4 % (ref 36.0–46.0)
Hemoglobin: 12.7 g/dL (ref 12.0–15.0)
Immature Granulocytes: 1 %
Lymphocytes Relative: 30 %
Lymphs Abs: 1.8 10*3/uL (ref 0.7–4.0)
MCH: 26.8 pg (ref 26.0–34.0)
MCHC: 32.2 g/dL (ref 30.0–36.0)
MCV: 83.3 fL (ref 80.0–100.0)
Monocytes Absolute: 0.4 10*3/uL (ref 0.1–1.0)
Monocytes Relative: 7 %
Neutro Abs: 3.6 10*3/uL (ref 1.7–7.7)
Neutrophils Relative %: 59 %
Platelet Count: 232 10*3/uL (ref 150–400)
RBC: 4.73 MIL/uL (ref 3.87–5.11)
RDW: 18.2 % — ABNORMAL HIGH (ref 11.5–15.5)
WBC Count: 6.1 10*3/uL (ref 4.0–10.5)
nRBC: 0 % (ref 0.0–0.2)

## 2021-03-07 LAB — IRON AND IRON BINDING CAPACITY (CC-WL,HP ONLY)
Iron: 51 ug/dL (ref 28–170)
Saturation Ratios: 12 % (ref 10.4–31.8)
TIBC: 414 ug/dL (ref 250–450)
UIBC: 363 ug/dL (ref 148–442)

## 2021-03-07 LAB — FERRITIN: Ferritin: 93 ng/mL (ref 11–307)

## 2021-03-07 LAB — ECHOCARDIOGRAM COMPLETE
Area-P 1/2: 2.21 cm2
S' Lateral: 3.8 cm

## 2021-03-08 NOTE — Progress Notes (Signed)
HEMATOLOGY-ONCOLOGY TELEPHONE VISIT PROGRESS NOTE  I connected with Sierra Perez on 03/10/2021 at  9:45 AM EST by telephone and verified that I am speaking with the correct person using two identifiers.  I discussed the limitations, risks, security and privacy concerns of performing an evaluation and management service by telephone and the availability of in person appointments.  I also discussed with the patient that there may be a patient responsible charge related to this service. The patient expressed understanding and agreed to proceed.   History of Present Illness: Sierra Perez is a 61 y.o. female with above-mentioned history of right breast cancer previously treated with neoadjuvant chemotherapy followed by lumpectomy and radiation for HER-2 positive breast cancer.She has been in remission until a recurrence found 05/10/2020. She underwent bilaterally mastectomies. She presents via telephone for follow-up.  Oncology History  Malignant neoplasm of upper-outer quadrant of right breast in female, estrogen receptor negative ([place])  08/27/2012 Initial Diagnosis   Stage 1a IDC with DCIS (T2N0M0) : ER-, PR-, HER positive 3+, Ki67 20-25%   08/27/2012 Cancer Staging   Staging form: Breast, AJCC 8th Edition - Clinical stage from 08/27/2012: Stage IIA (cT2, cN0, cM0, G3, ER-, PR-, HER2+) - Signed by Nicholas Lose, MD on 11/17/2019    09/06/2012 Breast MRI   Right breast, lateral to the nipple, 10x14x1m mass with mildly irregular borders   09/10/2012 Echocardiogram   EF 55-60%    Chemotherapy   Neoadjuvant Taxotere/Carboplatin/Herceptin/Perjeta followed by adjuvant Herceptin maintenance   01/09/2013 Breast MRI   Complete pathological response to neoadjuvant treatment: right breast mass previously noted at 9:00 position no longer identified.   02/13/2013 Surgery   Right breast lumpectomy: benign parenchyma with some stromal fibrosis consistent with previous tumor site, focal area of atypical lobular  hyperplasia, all lymph nodes negative.     Radiation Therapy   Adjuvant radiation   04/06/2013 Imaging   DEXA: T-score of -1.1, osteopenia    09/25/2016 Initial Biopsy   Columnar cell alteration with atypia, foreign body granuloma   05/10/2020 Relapse/Recurrence   Breast MRI detected new linear non-mass enhancement right breast 2.1 cm, non-mass enhancement centrally left breast spanning 10.5 cm, left breast nipple areolar complex enhancement: Biopsy right breast: Grade 2 invasive lobular cancer.  ER/PR positive HER-2 equivocal by IHC FISH pending, Ki-67 1% left breast biopsy anterior fibrocystic change, posterior atypical lobular hyperplasia/LCIS   05/26/2020 Genetic Testing   Negative genetic testing on the CancerNext-Expanded+RNAinsight panel.  The [facility]Expanded gene panel [facility] includes sequencing and rearrangement analysis for the following 77 genes: AIP, ALK, APC*, ATM*, AXIN2, BAP1, BARD1, BLM, BMPR1A, BRCA1*, BRCA2*, BRIP1*, CDC73, CDH1*, CDK4, CDKN1B, CDKN2A, CHEK2*, CTNNA1, DICER1, FANCC, FH, FLCN, GALNT12, KIF1B, LZTR1, MAX, MEN1, MET, MLH1*, MSH2*, MSH3, MSH6*, MUTYH*, NBN, NF1*, NF2, NTHL1, PALB2*, PHOX2B, PMS2*, POT1, PRKAR1A, PTCH1, PTEN*, RAD51C*, RAD51D*, RB1, RECQL, RET, SDHA, SDHAF2, SDHB, SDHC, SDHD, SMAD4, SMARCA4, SMARCB1, SMARCE1, STK11, SUFU, TMEM127, TP53*, TSC1, TSC2, VHL and XRCC2 (sequencing and deletion/duplication); EGFR, EGLN1, HOXB13, KIT, MITF, PDGFRA, POLD1, and POLE (sequencing only); EPCAM and GREM1 (deletion/duplication only). DNA and RNA analyses performed for * genes. The report date is May 26, 2020.      Observations/Objective:     Assessment Plan:  Malignant neoplasm of upper-outer quadrant of right breast in female, estrogen receptor negative ([place] 08/27/2012: Right breast: Stage Ia IDC with DCIS T2 N0 M0 ER negative PR negative, HER-2 positive, Ki-67 20 to 25% status post neoadjuvant TCHP: Complete pathologic response,  adjuvant radiation  05/10/2020: Recurrence/relapse: Right breast: 2.1 cm non-mass enhancement: Biopsy grade 2 invasive lobular cancer ER/PR positive HER-2 equivocal by IHC FISH pending, Ki-67 1% Left breast: 10.5 cm non-mass enhancement anterior biopsy fibrocystic change, posterior biopsy ALH/LCIS   Treatment plan: 1.   07/08/2020 Bilateral mastectomies with reconstruction: Right mastectomy: Grade 2 ILC 0.9 cm, ALH, margins negative, 0/1 lymph, left mastectomy: Benign 2. adjuvant antiestrogen therapy with anastrozole 1 mg daily x7 years started July 2022 Genetic testing ------------------------------------------------------------ Current treatment: Adjuvant antiestrogen therapy with anastrozole 1 mg daily x7 years started July 2022 Anastrozole Toxicities: Denies any major side effects to anastrozole therapy.   We might consider doing a breast MRI every 3 years for implant integrity.   Breast examination: 01/23/2021: Benign   Iron deficiency anemia:Secondary to right hip replacement surgery November 19, 2020.  Her hemoglobin went from 10.4 down to 7.7.  Received IV iron 01/25/2021  Lab review: 03/07/2021: Hemoglobin 12.7, MCV 83.3, iron saturation 12%, ferritin 93 Excellent response to intravenous iron therapy.  She is no longer anemic.  Return to clinic in 1 year for follow-up    I discussed the assessment and treatment plan with the patient. The patient was provided an opportunity to ask questions and all were answered. The patient agreed with the plan and demonstrated an understanding of the instructions. The patient was advised to call back or seek an in-person evaluation if the symptoms worsen or if the condition fails to improve as anticipated.   Total time spent: 12 mins including non-face to face time and time spent for planning, charting and coordination of care  Rulon Eisenmenger, MD 03/10/2021    I, Thana Ates, am acting as scribe for Nicholas Lose, MD.  I have reviewed the above  documentation for accuracy and completeness, and I agree with the above.

## 2021-03-10 ENCOUNTER — Encounter: Payer: Self-pay | Admitting: Hematology and Oncology

## 2021-03-10 ENCOUNTER — Inpatient Hospital Stay (HOSPITAL_BASED_OUTPATIENT_CLINIC_OR_DEPARTMENT_OTHER): Payer: 59 | Admitting: Hematology and Oncology

## 2021-03-10 DIAGNOSIS — Z171 Estrogen receptor negative status [ER-]: Secondary | ICD-10-CM

## 2021-03-10 DIAGNOSIS — C50411 Malignant neoplasm of upper-outer quadrant of right female breast: Secondary | ICD-10-CM

## 2021-03-10 MED ORDER — ANASTROZOLE 1 MG PO TABS: 1.0000 mg | ORAL_TABLET | Freq: Every day | ORAL | 3 refills | Status: DC

## 2021-03-10 NOTE — Assessment & Plan Note (Signed)
08/27/2012: Right breast: Stage Ia IDC with DCIS T2 N0 M0 ER negative PR negative, HER-2 positive, Ki-67 20 to 25% status post neoadjuvant TCHP: Complete pathologic response, adjuvant radiation  05/10/2020: Recurrence/relapse: Right breast: 2.1 cm non-mass enhancement: Biopsy grade 2 invasive lobular cancer ER/PR positive HER-2 equivocal by IHC FISH pending, Ki-67 1% Left breast: 10.5 cm non-mass enhancement anterior biopsy fibrocystic change, posterior biopsy ALH/LCIS  Treatment plan: 1.6/6/2022Bilateral mastectomies with reconstruction: Right mastectomy: Grade 2 ILC 0.9 cm, ALH, margins negative, 0/1 lymph, left mastectomy: Benign 2.adjuvant antiestrogen therapy with anastrozole 1 mg daily x7 years started July 2022 Genetic testing ------------------------------------------------------------ Current treatment: Adjuvant antiestrogen therapy with anastrozole 1 mg daily x7 years started July 2022 Anastrozole Toxicities: Denies any major side effects to anastrozole therapy.  We might consider doing a breast MRI every 3 years for implant integrity.  Breast examination: 01/23/2021: Benign  Iron deficiency anemia:Secondary to right hip replacement surgery November 19, 2020.  Her hemoglobin went from 10.4 down to 7.7.  Received IV iron 01/25/2021  Lab review: 03/07/2021: Hemoglobin 12.7, MCV 83.3, iron saturation 12%, ferritin 93 Excellent response to intravenous iron therapy.  She is no longer anemic.  Return to clinic in 1 year for follow-up

## 2021-03-11 ENCOUNTER — Telehealth: Payer: Self-pay | Admitting: Hematology and Oncology

## 2021-03-11 NOTE — Telephone Encounter (Signed)
Scheduled appointment per 2/6 los. Patient is aware of appointment.

## 2021-03-13 ENCOUNTER — Other Ambulatory Visit: Payer: Self-pay

## 2021-03-13 ENCOUNTER — Ambulatory Visit: Payer: 59 | Attending: General Surgery

## 2021-03-13 VITALS — Wt 229.4 lb

## 2021-03-13 DIAGNOSIS — Z483 Aftercare following surgery for neoplasm: Secondary | ICD-10-CM | POA: Insufficient documentation

## 2021-03-13 NOTE — Therapy (Signed)
[facility] @ [place], [place], [address] Phone: [phone]   Fax:  [phone]  Physical Therapy Treatment  Patient Details  Name: Sierra Perez MRN: [id number] Date of Birth: 1960/07/20 Referring Provider (PT): Dr. 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, her-2 positive, ER/PR negative   Clotting disorder ([place])    testing was negative, h/o DVT while on treatment   Complex regional pain syndrome i of right lower limb    RDS   Diabetes mellitus without complication ([place])    DVT (deep venous thrombosis) ([place])    started in 2015, multiple   Family history of breast cancer    Family history of colon cancer    Fibroid    GERD (gastroesophageal reflux disease)    Heart murmur    HPV in female    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   Hypertension    Hypothyroidism    IBS (irritable bowel syndrome)    Mitral valve prolapse    Personal history of malignant neoplasm of breast    Thyroid disease     Past Surgical History:  Procedure Laterality Date   ABDOMINAL HYSTERECTOMY  2010   fibroids, h/o abnormal pap smears   AXILLARY SENTINEL NODE BIOPSY Right 07/08/2020   Procedure: RIGHT AXILLARY SENTINEL NODE BIOPSY;  Surgeon: Rolm Bookbinder, MD;  Location: [facility];  Service: General;  Laterality: Right;   BREAST RECONSTRUCTION WITH PLACEMENT OF TISSUE EXPANDER AND FLEX HD (ACELLULAR HYDRATED DERMIS) Bilateral 07/08/2020   Procedure: BILATERAL BREAST RECONSTRUCTION WITH  FLEX HD  (ACELLULAR HYDRATED DERMIS),DIRECT IMPLANT RECONSTRUCTION;  Surgeon: Cindra Presume, MD;  Location: [facility];  Service: Plastics;  Laterality: Bilateral;   BREAST SURGERY Right 2015   right lumpectomy with reduction and lift   COLONOSCOPY     DENTAL SURGERY     ESOPHAGOGASTRODUODENOSCOPY     FOOT SURGERY     dislocated toe   LAPAROSCOPIC GASTRIC SLEEVE RESECTION  2014   LAPAROSCOPIC OOPHERECTOMY Bilateral 2016   REPLACEMENT TOTAL KNEE Left 2018   also had 3 prior arthroscopies   TOTAL HIP ARTHROPLASTY Right 11/19/2020   Procedure: RIGHT TOTAL HIP ARTHROPLASTY ANTERIOR APPROACH;  Surgeon: Mcarthur Rossetti, MD;  Location: [facility];  Service: Orthopedics;  Laterality: Right;   TOTAL MASTECTOMY Bilateral 07/08/2020   Procedure: BILATERAL TOTAL MASTECTOMY;  Surgeon: Rolm Bookbinder, MD;  Location: [facility];  Service: General;  Laterality: Bilateral;   VENA CAVA FILTER PLACEMENT  2017    Vitals:   03/13/21 1207  Weight: 229 lb 6 oz (104 kg)     Subjective Assessment - 03/13/21 1207     Subjective Pt returns for her 3 month SOZO.    Pertinent History 2014 Rt breast cancer with lumpectomy with SLNB, radiation, and chemotherapy. Recurrence with bil mastectomy with SLNB on 07/08/20 due to  grade 2 ILC ER/PR positive.  Other history includes clotting disorder and DVT history, diabetes                    L-DEX FLOWSHEETS - 03/13/21 1200       L-DEX LYMPHEDEMA SCREENING   Measurement Type Unilateral    L-DEX MEASUREMENT EXTREMITY Upper Extremity    POSITION  Standing    DOMINANT SIDE Left    At Risk Side Right    BASELINE SCORE (UNILATERAL) 5.9    L-DEX SCORE (UNILATERAL) 20.2    VALUE CHANGE (UNILAT) 14.3                                     PT Long Term Goals - 10/01/20 6629       PT LONG TERM GOAL #1   Title Pt will return to close to baseline shoulder AROM to demonstrate no additional need for PT    Time 12    Period Weeks    Status  Achieved      PT LONG TERM GOAL #2   Title Pt will be educated on lymphedema risk reduction and sleeve use    Time 12    Period Weeks    Status Achieved      PT LONG TERM GOAL #3   Title Pt will be scheduled for SOZO surveillance    Time 12    Period Weeks    Status Achieved      PT LONG TERM GOAL #4   Title Pt will be independent in Strength ABC program for long term strengthening and stretching    Time 4    Period Weeks    Status Achieved      PT LONG TERM GOAL #5   Title Pt will demonstrate a decrease in edema in R breast as evidenced by decreased pore size.    Baseline 10/01/20- pore size is decreased especially at lateral aspect    Time 4    Period Weeks    Status Achieved      PT LONG TERM GOAL #6   Title Pt will be independent in self MLD for long term management of edema.    Time 4    Period Weeks    Status Achieved                   Plan - 03/13/21 1241     Clinical Impression Statement Pt returns for her 3 month L-Dex screen. Her change from baseline of 14.3 indicates subclinical lymphedema. Pt was measured for a compression sleeve but does not fit into our available sleeves. She reports she got one a few months ago but sure if it will still fit as she has gained weight since then. However, she is going to go home to look for it and try it on. If it doesn't fit she will go to [employer] to purchase as she had to pay out of pocket for first one. Pt was instructed to wear 10 hrs/day (not for nighttime) for next month until she returns for another SOZO. Pt verbalized good understanding of all.    PT Next Visit Plan L-Dex screen in 1 month for reassess of SOZO to see if pts baseline has returned to [facility]; if so resume every 3 month L-Dex screens for up to 2 years from her SLNB ( ~07/09/2022)  Consulted and Agree with Plan of Care Patient             Patient will benefit from skilled therapeutic intervention in order to improve the following  deficits and impairments:     Visit Diagnosis: Aftercare following surgery for neoplasm     Problem List Patient Active Problem List   Diagnosis Date Noted   Iron deficiency anemia 01/23/2021   Status post right hip replacement 12/03/2020   Status post total replacement of right hip 11/19/2020   Breast cancer ([place]) 07/08/2020   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   H/O total hysterectomy 06/11/2020   Genetic testing 05/27/2020   Family history of breast cancer    Family history of colon cancer    Personal history of malignant neoplasm of breast    Unilateral primary osteoarthritis, right knee 02/26/2020   Unilateral primary osteoarthritis, 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   DVT (deep venous thrombosis) ([place]) 05/04/2018   Malignant neoplasm of upper-outer quadrant of right breast in female, estrogen receptor negative ([place]) 05/03/2018    Otelia Limes, PTA 03/13/2021, 12:45 PM  [facility] @ [place], [place], [address] Phone: [phone]   Fax:  [phone]  Name: Shewanda Sharpe MRN: [id number] Date of Birth: 1960-06-01

## 2021-03-18 ENCOUNTER — Ambulatory Visit: Payer: 59 | Admitting: Surgical

## 2021-03-25 ENCOUNTER — Telehealth (HOSPITAL_COMMUNITY): Payer: Self-pay

## 2021-03-25 MED ORDER — ROSUVASTATIN CALCIUM 5 MG PO TABS: 5.0000 mg | ORAL_TABLET | Freq: Every day | ORAL | 3 refills | Status: DC

## 2021-03-25 NOTE — Telephone Encounter (Signed)
-----   Message from Jolaine Artist, MD sent at 03/22/2021  4:30 PM EST ----- Minimal CAD. Likely should be on statin but nothing else needed. Would start crestor 5 if she is amenable to that.

## 2021-03-25 NOTE — Telephone Encounter (Signed)
Pt aware of results. Amenable to starting Crestor 5mg  daily

## 2021-04-10 ENCOUNTER — Ambulatory Visit: Payer: 59 | Attending: General Surgery

## 2021-04-10 ENCOUNTER — Other Ambulatory Visit: Payer: Self-pay

## 2021-04-10 VITALS — Wt 231.2 lb

## 2021-04-10 DIAGNOSIS — Z483 Aftercare following surgery for neoplasm: Secondary | ICD-10-CM | POA: Insufficient documentation

## 2021-04-10 NOTE — Therapy (Signed)
[facility] ?[facility] @ [place] ?[place], [place], [address] ?Phone: [phone]   Fax:  [phone] ? ?Physical Therapy Treatment ? ?Patient Details  ?Name: Sierra Perez ?MRN: [id number] ?Date of Birth: Mar 07, 1960 ?Referring Provider (PT): Dr. Donne Hazel ? ? ?Encounter Date: 04/10/2021 ? ? PT End of Session - 04/10/21 1230   ? ? Visit Number 7   # unchanged due to screen only  ? PT Start Time 1215   ? PT Stop Time 1231   ? PT Time Calculation (min) 16 min   ? Activity Tolerance Patient tolerated treatment well   ? Behavior During Therapy [facility] for tasks assessed/performed   ? ?  ?  ? ?  ? ? ?Past Medical History:  ?Diagnosis Date  ? Anemia   ? Anxiety   ? Asthma   ? Breast cancer ([place]) 2014  ? Invasive ductal, her-2 positive, ER/PR negative  ? Clotting disorder ([place])   ? testing was negative, h/o DVT while on treatment  ? Complex regional pain syndrome i of right lower limb   ? RDS  ? Diabetes mellitus without complication ([place])   ? DVT (deep venous thrombosis) ([place])   ? started in 2015, multiple  ? Family history of breast cancer   ? Family history of colon cancer   ? Fibroid   ? GERD (gastroesophageal reflux disease)   ? Heart murmur   ? HPV in female   ? HSV (herpes simplex virus) anogenital infection   ? HSV 1  ? Hypertension   ? Hypothyroidism   ? IBS (irritable bowel syndrome)   ? Mitral valve prolapse   ? Personal history of malignant neoplasm of breast   ? Thyroid disease   ? ? ?Past Surgical History:  ?Procedure Laterality Date  ? ABDOMINAL HYSTERECTOMY  2010  ? fibroids, h/o abnormal pap smears  ? AXILLARY SENTINEL NODE BIOPSY Right 07/08/2020  ? Procedure: RIGHT AXILLARY SENTINEL NODE BIOPSY;  Surgeon: Rolm Bookbinder, MD;  Location: [facility];  Service: General;  Laterality: Right;  ? BREAST RECONSTRUCTION WITH PLACEMENT OF TISSUE EXPANDER AND FLEX HD (ACELLULAR HYDRATED DERMIS) Bilateral 07/08/2020  ? Procedure: BILATERAL BREAST RECONSTRUCTION WITH  FLEX HD  (ACELLULAR HYDRATED DERMIS),DIRECT IMPLANT RECONSTRUCTION;  Surgeon: Cindra Presume, MD;  Location: [facility];  Service: Plastics;  Laterality: Bilateral;  ? BREAST SURGERY Right 2015  ? right lumpectomy with reduction and lift  ? COLONOSCOPY    ? DENTAL SURGERY    ? ESOPHAGOGASTRODUODENOSCOPY    ? FOOT SURGERY    ? dislocated toe  ? LAPAROSCOPIC GASTRIC SLEEVE RESECTION  2014  ? LAPAROSCOPIC OOPHERECTOMY Bilateral 2016  ? REPLACEMENT TOTAL KNEE Left 2018  ? also had 3 prior arthroscopies  ? TOTAL HIP ARTHROPLASTY Right 11/19/2020  ? Procedure: RIGHT TOTAL HIP ARTHROPLASTY ANTERIOR APPROACH;  Surgeon: Mcarthur Rossetti, MD;  Location: [facility];  Service: Orthopedics;  Laterality: Right;  ? TOTAL MASTECTOMY Bilateral 07/08/2020  ? Procedure: BILATERAL TOTAL MASTECTOMY;  Surgeon: Rolm Bookbinder, MD;  Location: [facility];  Service: General;  Laterality: Bilateral;  ? VENA CAVA FILTER PLACEMENT  2017  ? ? ?Vitals:  ? 04/10/21 1213  ?Weight: 231 lb 4 oz (104.9 kg)  ? ? ? Subjective Assessment - 04/10/21 1214   ? ? Subjective Pt returns for her 1 month SOZO assess after wearing compression sleeve x30 days due to high change from baseline.   ? Pertinent History 2014 Rt breast cancer with lumpectomy with SLNB,  radiation, and chemotherapy. Recurrence with bil mastectomy with SLNB on 07/08/20 due to grade 2 ILC ER/PR positive.  Other history includes clotting disorder and DVT history, diabetes   ? ?  ?  ? ?  ? ? ? ? ? ? ? ? ? L-DEX FLOWSHEETS - 04/10/21 1200   ? ?  ? L-DEX LYMPHEDEMA SCREENING  ? Measurement Type Unilateral   ? L-DEX MEASUREMENT EXTREMITY Upper Extremity   ? POSITION  Standing   ? DOMINANT SIDE Left   ? At Risk Side Right   ? BASELINE SCORE (UNILATERAL) 5.9   ? L-DEX SCORE (UNILATERAL) 4.7   ? VALUE CHANGE (UNILAT) -1.2   ? ?  ?  ? ?  ? ? ? ? ? ? ? ? ? ? ? ? ? ? ? ? ? ? ? ? ? ? ? ? ? ? PT Long Term Goals - 10/01/20 0858   ? ?  ? PT LONG TERM GOAL #1  ? Title Pt will return to close to baseline shoulder AROM to  demonstrate no additional need for PT   ? Time 12   ? Period Weeks   ? Status Achieved   ?  ? PT LONG TERM GOAL #2  ? Title Pt will be educated on lymphedema risk reduction and sleeve use   ? Time 12   ? Period Weeks   ? Status Achieved   ?  ? PT LONG TERM GOAL #3  ? Title Pt will be scheduled for SOZO surveillance   ? Time 12   ? Period Weeks   ? Status Achieved   ?  ? PT LONG TERM GOAL #4  ? Title Pt will be independent in Strength ABC program for long term strengthening and stretching   ? Time 4   ? Period Weeks   ? Status Achieved   ?  ? PT LONG TERM GOAL #5  ? Title Pt will demonstrate a decrease in edema in R breast as evidenced by decreased pore size.   ? Baseline 10/01/20- pore size is decreased especially at lateral aspect   ? Time 4   ? Period Weeks   ? Status Achieved   ?  ? PT LONG TERM GOAL #6  ? Title Pt will be independent in self MLD for long term management of edema.   ? Time 4   ? Period Weeks   ? Status Achieved   ? ?  ?  ? ?  ? ? ? ? ? ? ? ? Plan - 04/10/21 1231   ? ? Clinical Impression Statement Pt returns for her 30 day reassess after having an increased change from baseline. Her change from baseline now is greatly reduced at -1.2 and WNLs so no further treatment is required at this time. Answered pts questions regarding when it would be wise to cont wearing her compression sleeve to prevent subclinical lymphedema from occuring again. She was able to verbalize good understanding and felt much better knowing this has resolved. Pt was R/S for her next 3 month screen.   ? PT Next Visit Plan Resume every 3 month L-Dex screens for up to 2 years from her SLNB ( ~07/09/2022)   ? Consulted and Agree with Plan of Care Patient   ? ?  ?  ? ?  ? ? ?Patient will benefit from skilled therapeutic intervention in order to improve the following deficits and impairments:    ? ?Visit Diagnosis: ?Aftercare following surgery for neoplasm ? ? ? ? ?  Problem List ?Patient Active Problem List  ? Diagnosis Date Noted  ?  Iron deficiency anemia 01/23/2021  ? Status post right hip replacement 12/03/2020  ? Status post total replacement of right hip 11/19/2020  ? Breast cancer ([place]) 07/08/2020  ? 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  ? H/O total hysterectomy 06/11/2020  ? Genetic testing 05/27/2020  ? Family history of breast cancer   ? Family history of colon cancer   ? Personal history of malignant neoplasm of breast   ? Unilateral primary osteoarthritis, right knee 02/26/2020  ? Unilateral primary osteoarthritis, 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  ? 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 DVT (deep venous thrombosis) ([place]) 05/04/2018  ? Malignant neoplasm of upper-outer quadrant of right breast in female, estrogen receptor negative ([place]) 05/03/2018  ? ? ?Otelia Limes, PTA ?04/10/2021, 12:38 PM ? ?[facility] ?[facility] @ [place] ?[place], [place], [address] ?Phone: [phone]   Fax:  [phone] ? ?Name: Sierra Perez ?MRN: [id number] ?Date of Birth: 30-Jan-1961 ? ? ? ?

## 2021-05-07 ENCOUNTER — Ambulatory Visit (HOSPITAL_COMMUNITY)
Admission: RE | Admit: 2021-05-07 | Discharge: 2021-05-07 | Disposition: A | Discharge status: Home / Self Care | Payer: 59 | Source: Ambulatory Visit | Attending: Internal Medicine | Admitting: Internal Medicine

## 2021-05-07 ENCOUNTER — Encounter (HOSPITAL_COMMUNITY): Payer: Self-pay | Admitting: Internal Medicine

## 2021-05-07 ENCOUNTER — Encounter (HOSPITAL_COMMUNITY): Payer: Self-pay

## 2021-05-07 DIAGNOSIS — Z171 Estrogen receptor negative status [ER-]: Secondary | ICD-10-CM | POA: Diagnosis not present

## 2021-05-07 DIAGNOSIS — R002 Palpitations: Secondary | ICD-10-CM

## 2021-05-07 DIAGNOSIS — C50411 Malignant neoplasm of upper-outer quadrant of right female breast: Secondary | ICD-10-CM

## 2021-05-07 NOTE — Patient Instructions (Signed)
You have been referred to [facility] for weight loss medication, they will call you to discuss ? ?CONGRATULATIONS!!! You have graduated the [facility], if you need [place] again in the future please give [place] a call ? ?

## 2021-05-07 NOTE — Addendum Note (Signed)
Encounter addended by: Scarlette Calico, RN on: 05/07/2021 5:24 PM ? Actions taken: Clinical Note Signed

## 2021-05-07 NOTE — Progress Notes (Addendum)
?  ? ?Cardiology TeleHealth Note ? ?Due to national recommendations of social distancing due to [place] 19, Audio/video telehealth visit is felt to be most appropriate for this patient at this time.  See MyChart message from today for patient consent regarding telehealth for [facility]. The patient was identified personally using two identifiers.  ? ?Date:  05/07/2021  ? ?IDLadeja Perez, DOB 1960/06/20, MRN [id number]  Location: Home  ?Provider location: [facility] ?Type of Visit: Established patient ? ?PCP:  Wenda Low, MD  ?Cardiologist:  None ?Primary HF: [place] Lave ? ?Chief Complaint: Heart Failure follow-up ?  ?History of Present Illness: ? ? ?Sierra Perez is 61 y.o. 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 Lindi Adie for further evaluation of CP and palpitations.  ? ?She presents via Engineer, civil (consulting) for a telehealth visit today.  ?  ?Diagnosed with R breast cancer in 2014. ER/PR negative. HER2+. Completed neoadjuvant Taxotere/Carboplatin/Herceptin/Perjeta followed by adjuvant Herceptin maintenance x 1 year. R lumpectomy 1/15 followed by XRT. ?  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 Underwent bilateral mastectomies with reconstruction on 07/08/20. Being treated with adjuvant antiestrogen therapy with anastrozole 1 mg daily x7 years started July 2022 ?  ?Reports h/o MVP but no other known cardiac issues. Being treated for Fe-deficiency anemia. Echo in 2015 EF 57% GLS -22% ?  ?Had [place] in 9/22. Was fairly sick for 2 weeks but not hospitalized. Then had hip replacement in October. Post-op course c/b by Fe-deficient anemia. Getting IV iron.  ? ?We saw here a few weeks ago for palpitations and CP. Remains under a lot of stress. Mom in hospital for 2 weeks with heart surgery  ? ?[place] 1/23 ?1.Sinus rhythm - avg HR of 81 bpm. ?2. Eight runs of SVT  occurred, the run with the fastest interval lasting 10 beats with a max rate of 203 bpm, the longest lasting 20 beats with an avg rate of 115 bpm. ?3. Rare PACs and PVCs ?4. Patient triggers associated with sinus rhythm or PACs ? ?Cardiac CTA 1/23 ?CAC score 26 (all in LAD ?LAD 25-49%  ?LM, LCX and RCA are fine ? ?Sharlisa Merlo denies symptoms worrisome for COVID 19.  ? ?Past Medical History:  ?Diagnosis Date  ? Anemia   ? Anxiety   ? Asthma   ? Breast cancer ([place]) 2014  ? Invasive ductal, her-2 positive, ER/PR negative  ? Clotting disorder ([place])   ? testing was negative, h/o DVT while on treatment  ? Complex regional pain syndrome i of right lower limb   ? RDS  ? Diabetes mellitus without complication ([place])   ? DVT (deep venous thrombosis) ([place])   ? started in 2015, multiple  ? Family history of breast cancer   ? Family history of colon cancer   ? Fibroid   ? GERD (gastroesophageal reflux disease)   ? Heart murmur   ? HPV in female   ? HSV (herpes simplex virus) anogenital infection   ? HSV 1  ? Hypertension   ? Hypothyroidism   ? IBS (irritable bowel syndrome)   ? Mitral valve prolapse   ? Personal history of malignant neoplasm of breast   ? Thyroid disease   ? ?Past Surgical History:  ?Procedure Laterality Date  ? ABDOMINAL HYSTERECTOMY  2010  ? fibroids, h/o abnormal pap smears  ? AXILLARY SENTINEL NODE BIOPSY Right 07/08/2020  ?  Procedure: RIGHT AXILLARY SENTINEL NODE BIOPSY;  Surgeon: Rolm Bookbinder, MD;  Location: [facility];  Service: General;  Laterality: Right;  ? BREAST RECONSTRUCTION WITH PLACEMENT OF TISSUE EXPANDER AND FLEX HD (ACELLULAR HYDRATED DERMIS) Bilateral 07/08/2020  ? Procedure: BILATERAL BREAST RECONSTRUCTION WITH  FLEX HD (ACELLULAR HYDRATED DERMIS),DIRECT IMPLANT RECONSTRUCTION;  Surgeon: Cindra Presume, MD;  Location: [facility];  Service: Plastics;  Laterality: Bilateral;  ? BREAST SURGERY Right 2015  ? right lumpectomy with reduction and lift  ? COLONOSCOPY    ? DENTAL SURGERY    ?  ESOPHAGOGASTRODUODENOSCOPY    ? FOOT SURGERY    ? dislocated toe  ? LAPAROSCOPIC GASTRIC SLEEVE RESECTION  2014  ? LAPAROSCOPIC OOPHERECTOMY Bilateral 2016  ? REPLACEMENT TOTAL KNEE Left 2018  ? also had 3 prior arthroscopies  ? TOTAL HIP ARTHROPLASTY Right 11/19/2020  ? Procedure: RIGHT TOTAL HIP ARTHROPLASTY ANTERIOR APPROACH;  Surgeon: Mcarthur Rossetti, MD;  Location: [facility];  Service: Orthopedics;  Laterality: Right;  ? TOTAL MASTECTOMY Bilateral 07/08/2020  ? Procedure: BILATERAL TOTAL MASTECTOMY;  Surgeon: Rolm Bookbinder, MD;  Location: [facility];  Service: General;  Laterality: Bilateral;  ? VENA CAVA FILTER PLACEMENT  2017  ? ? ? ?Current Outpatient Medications  ?Medication Sig Dispense Refill  ? albuterol (VENTOLIN HFA) 108 (90 Base) MCG/ACT inhaler Inhale 1 puff into the lungs every 6 (six) hours as needed for wheezing or shortness of breath.    ? amLODipine (NORVASC) 5 MG tablet Take 5 mg by mouth daily.    ? anastrozole (ARIMIDEX) 1 MG tablet Take 1 tablet (1 mg total) by mouth daily. 90 tablet 3  ? CALCIUM CITRATE PO Take 2 tablets by mouth daily. Zinc  ?Copper ?Vit d  ?Manganese ?Sodium    ? Cholecalciferol (VITAMIN D-3) 25 MCG (1000 UT) CAPS Take 1,000 Units by mouth daily.    ? DULoxetine (CYMBALTA) 30 MG capsule 30 mg daily.    ? DULoxetine (CYMBALTA) 60 MG capsule Take 60 mg by mouth daily.    ? ELIQUIS 5 MG TABS tablet TAKE 1 TABLET(5 MG) BY MOUTH TWICE DAILY 180 tablet 0  ? fluticasone (FLONASE) 50 MCG/ACT nasal spray Place 2 sprays into both nostrils daily.    ? gabapentin (NEURONTIN) 600 MG tablet Take 600 mg by mouth 2 (two) times daily.    ? ipratropium (ATROVENT) 0.03 % nasal spray Place 2 sprays into both nostrils 2 (two) times daily.    ? Lactobacillus Rhamnosus, GG, (CULTURELLE PO) Take 1 capsule by mouth daily.    ? levothyroxine (SYNTHROID) 50 MCG tablet Take 50 mcg by mouth every morning.    ? metFORMIN (GLUCOPHAGE) 500 MG tablet Take 500 mg by mouth 2 (two) times daily.    ?  pantoprazole (PROTONIX) 40 MG tablet Take 1 tablet (40 mg total) by mouth daily.    ? rosuvastatin (CRESTOR) 5 MG tablet Take 1 tablet (5 mg total) by mouth daily. 90 tablet 3  ? Scar Treatment Products [facility] ADVANCED SCAR GEL EX) Apply 1 application topically daily.    ? sennosides-docusate sodium (SENOKOT-S) 8.6-50 MG tablet Take 2 tablets by mouth daily.    ? valACYclovir (VALTREX) 500 MG tablet Take 1 tablet (500 mg total) by mouth daily. 30 tablet 12  ? ?No current facility-administered medications for this encounter.  ? ? ?Allergies:   Succinylcholine, Keflex [cephalexin], and Sulfa antibiotics  ? ?Social History:  The patient  reports that she has never smoked. She has never used smokeless tobacco.  She reports current alcohol use. She reports that she does not currently use drugs after having used the following drugs: Marijuana.  ? ?Family History:  The patient's family history includes Bladder Cancer in her maternal grandmother; Breast cancer in her paternal grandmother; Colon cancer (age of onset: 24) in her maternal grandfather; Dementia in her father; Diabetes in her father; Factor VIII deficiency in her father; Hypertension in her mother; Kidney disease in her father; Lung cancer in her maternal grandfather; Prostate cancer in an other family member; Skin cancer in her mother; Stroke in her maternal grandmother; Thyroid cancer in her maternal grandfather.  ? ?ROS:  Please see the history of present illness.   All other systems are personally reviewed and negative.  ? ?Exam:  (Video/Tele Health Call; Exam is subjective and or/visual.) ?General:  Speaks in full sentences. No resp difficulty. ?Lungs: Normal respiratory effort with conversation.  ?Abdomen: Non-distended per patient report ?Extremities: Pt denies edema. ?Neuro: Alert & oriented x 3.  ? ?Recent Labs: ?11/16/2020: ALT 17; Magnesium 1.9 ?03/03/2021: BUN 17; Potassium 4.3; Sodium 141 ?03/04/2021: Creatinine, Ser 0.60 ?03/07/2021: Hemoglobin 12.7;  Platelet Count 232  ?Personally reviewed  ? ?Wt Readings from Last 3 Encounters:  ?04/10/21 104.9 kg (231 lb 4 oz)  ?03/13/21 104 kg (229 lb 6 oz)  ?02/05/21 106.5 kg (234 lb 12.8 oz)  ?  ? ? ?ASSESSMENT AND PLAN: ? ?1. Palp

## 2021-05-08 ENCOUNTER — Telehealth: Payer: Self-pay | Admitting: Pharmacist

## 2021-05-08 NOTE — Telephone Encounter (Signed)
Pt referred by Dr Haroldine Laws to PharmD to initiate [facility] therapy for weight loss. Prior authorization for [facility] has been submitted. If her plan doesn't cover weight loss meds, will try for [facility] coverage next. ?

## 2021-05-08 NOTE — Telephone Encounter (Signed)
[employer] prior auth approved through 05/09/22. ? ?Left message for pt to discuss med and schedule OV with PharmD if interested. She'll qualify for $25/month copay card as well - BIN [id number], PCN [id number], GRP [id number], ID [id number]. ?

## 2021-05-08 NOTE — Telephone Encounter (Signed)
[facility] prior authorization denied - weight loss medications not covered by patient's insurance. Will submit prior authorization for [facility] since pt also has DM. Most recent A1c is 7% on metformin therapy. ?

## 2021-05-13 ENCOUNTER — Encounter: Payer: Self-pay | Admitting: Pharmacist

## 2021-05-13 MED ORDER — MOUNJARO 2.5 MG/0.5ML ~~LOC~~ SOAJ
2.5000 mg | SUBCUTANEOUS | 0 refills | Status: DC
Start: 2021-05-13 — End: 2021-07-11

## 2021-05-13 NOTE — Telephone Encounter (Signed)
2nd voicemail left for pt.

## 2021-05-13 NOTE — Telephone Encounter (Signed)
Pt left message returning call. Spoke with pt, denies personal/family history of thyroid cancer. She is interested in starting [place]. Would like to stop her metformin if possible. Discussed will continue initially but can reduce/stop pending follow up A1cs (metformin therapy necessary for [employer]). Rx sent to pharmacy, pt sent copay card information, and PharmD appt scheduled in the end of May per pt preference (has family travel coming up). She is aware to store med in the fridge and bring to her first appt. ?

## 2021-05-23 ENCOUNTER — Telehealth (HOSPITAL_COMMUNITY): Payer: Self-pay | Admitting: Vascular Surgery

## 2021-05-23 NOTE — Telephone Encounter (Signed)
Left vm to get pt schedule w. Echo w/ DB in July/ Aug ?

## 2021-06-20 NOTE — Progress Notes (Signed)
Patient ID: Sierra Perez                 DOB: [birth year]                    MRN: [id number]     HPI: Sierra Perez is a 61 y.o. female patient referred to pharmacy clinic by Dr. Haroldine Perez to initiate weight loss therapy with GLP1-RA. 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 Most recent BMI 35.37 kg/m2. Denies personal/family hx of thyroid cancer. Denies personal hx of pancreatitis. Insurance plan does not cover weight loss medication (prior authorization for [employer] denied), but with DM diagnosis PA for Sierra Perez was approved through 05/09/2022.   Patient presents today for GLP1RA initiation with her Mounjaro 2.5 mg pen, which has been kept in the refrigerator. Insurance covered the medication for $25/1 mo supply. Pt is currently tolerating metformin 500 mg BID, though she plans to ask her PCP to prescribe her the extended release version at her next appt so that she does not have to time her doses with food. Pt admits that her diet recently has not been great as she knew she was going to start this medication soon. Pt endorses chronic constipation for which she takes sennakot OTC daily. She is supposed to take Miralax twice daily, though she has not been adherent to this regimen. Willing to start being more consistent with Miralax while taking Mounjaro.   Current weight management medications: none  Previously tried meds: none  Current meds that may affect weight: metformin (-), gabapentin (+)  Baseline weight/BMI: 232 lbs, BMI 35.37 kg/m2  Insurance payor: [employer]  Diet:  -Breakfast: Greek yogurt, sometimes skips, coffee with collagen and protein powder -Lunch: frozen meal, salad, sandwich -Dinner: fish, stuffed pepper -Snacks: chips, ice cream, frequently over indulges on snacks -Drinks: water, occasional diet soda  Exercise: needs TKA, knee swells anytime she goes to  the gym. Has a nerve disorder, so she is worried about getting TKA. Can walk, but knee hurts with walking too fast or too far. Walks ~1 mi/day.   Family History: DM, Factor VII deficiency, kidney disease in father. HTN in her mother. Stroke in maternal grandmother.  Social History: Denies tobacco use. Alcohol intake <1 glass of wine/week. Previous marijuana use.  Labs: 04/30/20 Lipid panel: LDL 123, TG 128 Lab Results  Component Value Date   HGBA1C 7.0 (H) 11/15/2020    Wt Readings from Last 1 Encounters:  04/10/21 231 lb 4 oz (104.9 kg)    BP Readings from Last 1 Encounters:  03/04/21 116/70   Pulse Readings from Last 1 Encounters:  03/04/21 (!) 57    No results found for: CHOL, TRIG, HDL, CHOLHDL, VLDL, LDLCALC, LDLDIRECT  Past Medical History:  Diagnosis Date   Anemia    Anxiety    Asthma    Breast cancer ([place]) 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, her-2 positive, ER/PR negative   Clotting disorder ([place])    testing was negative, h/o DVT while on treatment   Complex regional pain syndrome i of right lower limb    RDS   Diabetes mellitus without complication ([place])    DVT (deep venous thrombosis) ([place])    started in 2015, multiple   Family history of breast cancer    Family history of colon cancer    Fibroid    GERD (gastroesophageal reflux disease)    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     amLODipine (NORVASC) 5 MG tablet Take 5 mg by mouth daily.     anastrozole (ARIMIDEX) 1 MG tablet Take 1 tablet (1 mg total) by  mouth daily. 90 tablet 3   CALCIUM CITRATE PO Take 2 tablets by mouth daily. Zinc  Copper Vit d  Manganese Sodium     Cholecalciferol (VITAMIN D-3) 25 MCG (1000 UT) CAPS Take 1,000 Units by mouth daily.     DULoxetine (CYMBALTA) 30 MG capsule 30 mg daily.     DULoxetine (CYMBALTA) 60 MG capsule Take 60 mg by mouth daily.     ELIQUIS 5 MG TABS tablet TAKE 1 TABLET(5 MG) BY MOUTH TWICE DAILY 180 tablet 0   fluticasone (FLONASE) 50 MCG/ACT nasal spray Place 2 sprays into both nostrils daily.     gabapentin (NEURONTIN) 600 MG tablet Take 600 mg by mouth 2 (two) times daily.     ipratropium (ATROVENT) 0.03 % nasal spray Place 2 sprays into both nostrils 2 (two) times daily.     Lactobacillus Rhamnosus, GG, (CULTURELLE PO) Take 1 capsule by mouth daily.     levothyroxine (SYNTHROID) 50 MCG tablet Take 50 mcg by mouth every morning.     metFORMIN (GLUCOPHAGE) 500 MG tablet Take 500 mg by mouth 2 (two) times daily.     pantoprazole (PROTONIX) 40 MG tablet Take 1 tablet (40 mg total) by mouth daily.     rosuvastatin (CRESTOR) 5 MG tablet Take 1 tablet (5 mg total) by mouth daily. 90 tablet 3   Scar Treatment Products [facility] ADVANCED SCAR GEL EX) Apply 1 application topically daily.     sennosides-docusate sodium (SENOKOT-S) 8.6-50 MG tablet Take 2 tablets by mouth daily.     tirzepatide [facility]) 2.5 MG/0.5ML Pen Inject 2.5 mg into the skin once a week. 2 mL 0   valACYclovir (VALTREX) 500 MG tablet Take 1 tablet (500 mg total) by mouth daily. 30 tablet 12   No current facility-administered medications on file prior to visit.    Allergies  Allergen Reactions   Succinylcholine Other (See Comments)    Muscle aching  18 years ago from 07/02/20   Keflex [Cephalexin] Diarrhea and Nausea And Vomiting   Sulfa Antibiotics Rash    And swelling generalized not in throat     Assessment/Plan:  1. Weight loss/DM - Patient has not met goal of at least 5% of body weight loss with comprehensive  lifestyle modifications alone in the past 3-6 months. Pharmacotherapy is appropriate to pursue as augmentation. Will start Mounjaro given concomitant DM. Confirmed patient not pregnant and no personal or family history of medullary thyroid carcinoma (MTC) or Multiple Endocrine Neoplasia syndrome type 2 (MEN 2).   Advised patient on common side effects including nausea, diarrhea, dyspepsia, decreased appetite, and fatigue. Counseled patient on reducing meal size and how to titrate medication to minimize side effects. Counseled patient to call if intolerable side effects or if  experiencing dehydration, abdominal pain, or dizziness. Patient will adhere to dietary modifications and will target at least 150 minutes of moderate intensity exercise weekly. Pt amenable to taking Miralax twice daily given hx of constipation.   Injection technique reviewed at today's visit and patient successfully self-administered first dose of Mounjaro 2.46m into the fatty tissue of the abdomen.  Titration Plan:  Will plan to follow the titration plan as below, pending patient is tolerating each dose before increasing to the next. Can slow titration if needed for tolerability.    -Month 1: Inject 2.5 mg SQ once weekly x 4 weeks -Month 2: Inject 5 mg SQ once weekly x 4 weeks -Month 3: Inject 7.5 mg SQ once weekly x 4 weeks -Month 4: Inject 10 mg SQ once weekly x 4 weeks -Month 5: Inject 12.5 mg SQ once weekly x 4 weeks -Month 6+: Inject 15 mg SQ once weekly   Follow up in 1 mo via telephone, then 3 mo in person to recheck A1c.  Patient seen with LPark Liter PY4 PharmD Candidate  Megan E. Supple, PharmD, BCACP, [id number]. [id number] [place] [address]Phone: [phone] Fax: [phone]/22/2023 9:25 AM

## 2021-06-23 ENCOUNTER — Encounter: Payer: Self-pay | Admitting: Pharmacist

## 2021-06-23 ENCOUNTER — Ambulatory Visit: Payer: 59 | Admitting: Pharmacist

## 2021-06-23 DIAGNOSIS — E669 Obesity, unspecified: Secondary | ICD-10-CM | POA: Insufficient documentation

## 2021-06-23 NOTE — Patient Instructions (Addendum)
Keep the medication in the fridge. You can take out the pen ~30 min prior to injecting to allow it to come to room temperature. The pen is safe to stay out of the fridge for up to 21 days if you are out of town and do not have consistent access to a fridge.   Try to take Miralax once or twice a day in addition to your other regularly scheduled medications to prevent constipation. If you notice worsening constipation or intolerable GI side effects please let us know: [phone].  We will call you in one month before it is time for your next dose increase to make sure you are tolerating the medication. We will see you back in 3 months to recheck your A1c too.   Mounjaro Counseling Points This medication reduces your appetite and may make you feel fuller longer.  Stop eating when your body tells you that you are full. This will likely happen sooner than you are used to. Store your medication in the fridge until you are ready to use it. Inject your medication in the fatty tissue of your lower abdominal area (2 inches away from belly button) or upper outer thigh. Rotate injection sites. Common side effects include: nausea, diarrhea/constipation, and heartburn, and are more likely to occur if you overeat.  Dosing schedule: - Month 1: Inject Mounjaro 2.'5mg'$  subcutaneously once weekly - Month 2: Inject Mounjaro '5mg'$  subcutaneously once weekly - Month 3: Inject Mounjaro 7.'5mg'$  subcutaneously once weekly - Month 4: Inject Mounjaro '10mg'$  subcutaneously once weekly - Month 5: Inject Mounjaro 12.'5mg'$  subcutaneously once weekly - Month 6: Inject Mounjaro '15mg'$  subcutaneously once weekly  *Can stop at lower dose pending tolerability and desired outcome  Tips for living a healthier life     Building a Healthy and Balanced Diet Make most of your meal vegetables and fruits -  of your plate. Aim for color and variety, and remember that potatoes don't count as vegetables on the Healthy Eating Plate because  of their negative impact on blood sugar.  Go for whole grains -  of your plate. Whole and intact grains--whole wheat, barley, wheat berries, quinoa, oats, brown rice, and foods made with them, such as whole wheat pasta--have a milder effect on blood sugar and insulin than white bread, white rice, and other refined grains.  Protein power -  of your plate. Fish, poultry, beans, and nuts are all healthy, versatile protein sources--they can be mixed into salads, and pair well with vegetables on a plate. Limit red meat, and avoid processed meats such as bacon and sausage.  Healthy plant oils - in moderation. Choose healthy vegetable oils like olive, canola, soy, corn, sunflower, peanut, and others, and avoid partially hydrogenated oils, which contain unhealthy trans fats. Remember that low-fat does not mean "healthy."  Drink water, coffee, or tea. Skip sugary drinks, limit milk and dairy products to one to two servings per day, and limit juice to a small glass per day.  Stay active. The red figure running across the [place] is a reminder that staying active is also important in weight control.  The main message of the Healthy Eating Plate is to focus on diet quality:  The type of carbohydrate in the diet is more important than the amount of carbohydrate in the diet, because some sources of carbohydrate--like vegetables (other than potatoes), fruits, whole grains, and beans--are healthier than others. The Healthy Eating Plate also advises consumers to avoid sugary beverages, a major source  of calories--usually with little nutritional value--in the American diet. The Healthy Eating Plate encourages consumers to use healthy oils, and it does not set a maximum on the percentage of calories people should get each day from healthy sources of fat. In this way, the Healthy Eating Plate recommends the opposite of the low-fat message promoted for decades by the  USDA.  [employer].no  SUGAR  Sugar is a huge problem in the modern day diet. Sugar is a big contributor to heart disease, diabetes, high triglyceride levels, fatty liver disease and obesity. Sugar is hidden in almost all packaged foods/beverages. Added sugar is extra sugar that is added beyond what is naturally found and has no nutritional benefit for your body. The American Heart Association recommends limiting added sugars to no more than 25g for women and 36 grams for men per day. There are many names for sugar including maltose, sucrose (names ending in "ose"), high fructose corn syrup, molasses, cane sugar, corn sweetener, raw sugar, syrup, honey or fruit juice concentrate.   One of the best ways to limit your added sugars is to stop drinking sweetened beverages such as soda, sweet tea, and fruit juice.  There is 65g of added sugars in one 20oz bottle of Coke! That is equal to 7.5 donuts.   Pay attention and read all nutrition facts labels. Below is an examples of a nutrition facts label. The #1 is showing you the total sugars where the # 2 is showing you the added sugars. This one serving has almost the max amount of added sugars per day!     20 oz Soda 65g Sugar = 7.5 Glazed Donuts  16oz Energy  Drink 54g Sugar = 6.5 Glazed Donuts  Large Sweet  Tea 38g Sugar = 4 Glazed Donuts  20oz Sports  Drink 34g Sugar = 3.5 Glazed Donuts  8oz Chocolate Milk 24g Sugar =2.5 Glazed Donuts  8oz Orange  Juice 21g Sugar = 2 Glazed Donuts  1 Juice Box 14g Sugar = 1.5 Glazed Donuts  16oz Water= NO SUGAR!!  EXERCISE  Exercise is good. We've all heard that. In an ideal world, we would all have time and resources to get plenty of it. When you are active, your heart pumps more efficiently and you will feel better.  Multiple studies show that even walking regularly has benefits that include living a longer life. The American Heart  Association recommends 150 minutes per week of exercise (30 minutes per day most days of the week). You can do this in any increment you wish. Nine or more 10-minute walks count. So does an hour-long exercise class. Break the time apart into what will work in your life. Some of the best things you can do include walking briskly, jogging, cycling or swimming laps. Not everyone is ready to "exercise." Sometimes we need to start with just getting active. Here are some easy ways to be more active throughout the day:  Take the stairs instead of the elevator  Go for a 10-15 minute walk during your lunch break (find a friend to make it more enjoyable)  When shopping, park at the back of the parking lot  If you take public transportation, get off one stop early and walk the extra distance  Pace around while making phone calls  Check with your doctor if you aren't sure what your limitations may be. Always remember to drink plenty of water when doing any type of exercise. Don't feel like a failure if you're not getting  the 90-150 minutes per week. If you started by being a couch potato, then just a 10-minute walk each day is a huge improvement. Start with little victories and work your way up.   HEALTHY EATING TIPS  When looking to improve your eating habits, whether to lose weight, lower blood pressure or just be healthier, it helps to know what a serving size is.   Grains 1 slice of bread,  bagel,  cup pasta or rice  Vegetables 1 cup fresh or raw vegetables,  cup cooked or canned Fruits 1 piece of medium sized fruit,  cup canned,   Meats/Proteins  cup dried       1 oz meat, 1 egg,  cup cooked beans, nuts or seeds  Dairy        Fats Individual yogurt container, 1 cup (8oz)    1 teaspoon margarine/butter or vegetable  milk or milk alternative, 1 slice of cheese          oil; 1 tablespoon mayonnaise or salad dressing                  Plan ahead: make a menu of the meals for a week then create a  grocery list to go with that menu. Consider meals that easily stretch into a night of leftovers, such as stews or casseroles. Or consider making two of your favorite meal and put one in the freezer for another night. Try a night or two each week that is "meatless" or "no cook" such as salads. When you get home from the grocery store wash and prepare your vegetables and fruits. Then when you need them they are ready to go.   Tips for going to the grocery store:  [place] store or generic brands  Check the weekly ad from your store on-line or in their in-store flyer  Look at the unit price on the shelf tag to compare/contrast the costs of different items  Buy fruits/vegetables in season  Carrots, bananas and apples are low-cost, naturally healthy items  If meats or frozen vegetables are on sale, buy some extras and put in your freezer  Limit buying prepared or "ready to eat" items, even if they are pre-made salads or fruit snacks  Do not shop when you're hungry  Foods at eye level tend to be more expensive. Look on the high and low shelves for deals.  Consider shopping at the farmer's market for fresh foods in season.  Avoid the cookie and chip aisles (these are expensive, high in calories and low in nutritional value). Shop on the outside of the grocery store.  Healthy food preparations:  If you can't get lean hamburger, be sure to drain the fat when cooking  Steam, saut (in olive oil), grill or bake foods  Experiment with different seasonings to avoid adding salt to your foods. Kosher salt, sea salt and Himalayan salt are all still salt and should be avoided. Try seasoning food with onion, garlic, thyme, rosemary, basil ect. Onion powder or garlic powder is ok. Avoid if it says salt (ie garlic salt).

## 2021-07-03 ENCOUNTER — Other Ambulatory Visit: Payer: Self-pay | Admitting: Hematology and Oncology

## 2021-07-07 ENCOUNTER — Ambulatory Visit: Payer: 59 | Admitting: Orthopaedic Surgery

## 2021-07-10 ENCOUNTER — Telehealth: Payer: Self-pay | Admitting: Pharmacist

## 2021-07-10 DIAGNOSIS — E119 Type 2 diabetes mellitus without complications: Secondary | ICD-10-CM

## 2021-07-10 NOTE — Telephone Encounter (Signed)
Called pt to follow up with Mounjaro tolerability. Will be due to increase dose to '5mg'$  weekly if tolerating well. Left message for pt to discuss.

## 2021-07-11 MED ORDER — MOUNJARO 5 MG/0.5ML ~~LOC~~ SOAJ: 5.0000 mg | SUBCUTANEOUS | 2 refills | Status: DC

## 2021-07-11 NOTE — Telephone Encounter (Signed)
Patient called back. Is tolerating Mounjaro 2.'5mg'$  well and is willing to increase to 0.'5mg'$  weekly.

## 2021-07-11 NOTE — Addendum Note (Signed)
Addended by: Rollen Sox on: 07/11/2021 04:31 PM   Modules accepted: Orders

## 2021-07-17 ENCOUNTER — Encounter: Payer: Self-pay | Admitting: Hematology and Oncology

## 2021-07-17 ENCOUNTER — Other Ambulatory Visit: Payer: Self-pay | Admitting: *Deleted

## 2021-07-17 DIAGNOSIS — Z171 Estrogen receptor negative status [ER-]: Secondary | ICD-10-CM

## 2021-07-18 ENCOUNTER — Other Ambulatory Visit: Payer: Self-pay | Admitting: *Deleted

## 2021-07-18 ENCOUNTER — Encounter: Payer: Self-pay | Admitting: Rehabilitation

## 2021-07-18 ENCOUNTER — Ambulatory Visit: Payer: 59 | Attending: Hematology and Oncology | Admitting: Rehabilitation

## 2021-07-18 DIAGNOSIS — Z483 Aftercare following surgery for neoplasm: Secondary | ICD-10-CM | POA: Diagnosis present

## 2021-07-18 DIAGNOSIS — L599 Disorder of the skin and subcutaneous tissue related to radiation, unspecified: Secondary | ICD-10-CM | POA: Insufficient documentation

## 2021-07-18 DIAGNOSIS — Z171 Estrogen receptor negative status [ER-]: Secondary | ICD-10-CM | POA: Diagnosis not present

## 2021-07-18 DIAGNOSIS — I972 Postmastectomy lymphedema syndrome: Secondary | ICD-10-CM | POA: Insufficient documentation

## 2021-07-18 DIAGNOSIS — R293 Abnormal posture: Secondary | ICD-10-CM | POA: Diagnosis present

## 2021-07-18 DIAGNOSIS — M6281 Muscle weakness (generalized): Secondary | ICD-10-CM | POA: Diagnosis present

## 2021-07-18 DIAGNOSIS — C50411 Malignant neoplasm of upper-outer quadrant of right female breast: Secondary | ICD-10-CM | POA: Diagnosis not present

## 2021-07-18 NOTE — Therapy (Signed)
OUTPATIENT PT/OT UPPER EXTREMITY LYMPHEDEMA EVALUATION  Patient Name: Sierra Perez MRN: [id number] DOB:03-Mar-1960, 61 y.o., female Today's Date: 07/18/2021   PT End of Session - 07/18/21 1205     Visit Number 1    Number of Visits 4    Date for PT Re-Evaluation 10/10/21    Authorization - Visit Number 1    Authorization - Number of Visits 23    PT Start Time 1105    PT Stop Time 1202    PT Time Calculation (min) 57 min    Activity Tolerance Patient tolerated treatment well    Behavior During Therapy WFL for tasks assessed/performed             Past Medical History:  Diagnosis Date   Anemia    Anxiety    Asthma    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, her-2 positive, ER/PR negative   Clotting disorder ([place])    testing was negative, h/o DVT while on treatment   Complex regional pain syndrome i of right lower limb    RDS   Diabetes mellitus without complication ([place])    DVT (deep venous thrombosis) ([place])    started in 2015, multiple   Family history of breast cancer    Family history of colon cancer    Fibroid    GERD (gastroesophageal reflux disease)    Heart murmur    HPV in female    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   Hypertension    Hypothyroidism    IBS (irritable bowel syndrome)    Mitral valve prolapse    Personal history of malignant neoplasm of breast    Thyroid disease    Past Surgical History:  Procedure Laterality Date   ABDOMINAL HYSTERECTOMY  2010   fibroids, h/o abnormal pap smears   AXILLARY SENTINEL NODE BIOPSY Right 07/08/2020   Procedure: RIGHT AXILLARY SENTINEL NODE BIOPSY;  Surgeon: Rolm Bookbinder, MD;  Location: [facility];  Service: General;  Laterality: Right;   BREAST RECONSTRUCTION WITH PLACEMENT OF TISSUE EXPANDER AND FLEX HD (ACELLULAR HYDRATED DERMIS) Bilateral 07/08/2020   Procedure: BILATERAL BREAST RECONSTRUCTION WITH  FLEX HD (ACELLULAR HYDRATED DERMIS),DIRECT IMPLANT RECONSTRUCTION;  Surgeon: Cindra Presume, MD;  Location: [facility];  Service: Plastics;  Laterality: Bilateral;   BREAST SURGERY Right 2015   right lumpectomy with reduction and lift   COLONOSCOPY     DENTAL SURGERY     ESOPHAGOGASTRODUODENOSCOPY     FOOT SURGERY     dislocated toe   LAPAROSCOPIC GASTRIC SLEEVE RESECTION  2014   LAPAROSCOPIC OOPHERECTOMY Bilateral 2016   REPLACEMENT TOTAL KNEE Left 2018   also had 3 prior arthroscopies   TOTAL HIP ARTHROPLASTY Right 11/19/2020   Procedure: RIGHT TOTAL HIP ARTHROPLASTY ANTERIOR APPROACH;  Surgeon: Mcarthur Rossetti, MD;  Location: [facility];  Service: Orthopedics;  Laterality: Right;   TOTAL MASTECTOMY Bilateral 07/08/2020   Procedure: BILATERAL TOTAL MASTECTOMY;  Surgeon: Rolm Bookbinder, MD;  Location: [facility];  Service: General;  Laterality: Bilateral;   VENA CAVA FILTER PLACEMENT  2017   Patient Active Problem List   Diagnosis Date Noted   Obesity (BMI 30-39.9) 06/23/2021   Iron deficiency anemia 01/23/2021   Status post right hip replacement 12/03/2020   Status post total replacement of right hip 11/19/2020   Breast cancer ([place]) 07/08/2020   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   H/O total hysterectomy 06/11/2020   Genetic testing 05/27/2020   Family history  of breast cancer    Family history of colon cancer    Personal history of malignant neoplasm of breast    Unilateral primary osteoarthritis, right knee 02/26/2020   Unilateral primary osteoarthritis, 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   DVT (deep venous thrombosis) ([place]) 05/04/2018   Malignant neoplasm of upper-outer quadrant of right breast in female, estrogen receptor negative ([place]) 05/03/2018    PCP: Dr. Lysle Rubens  REFERRING PROVIDER: Dr. Lindi Adie  REFERRING DIAG: C50.411,Z17.1 (ICD-10-CM) - Malignant neoplasm of upper-outer quadrant of right breast in female, estrogen receptor negative ([place], lymphedema  THERAPY DIAG:  Aftercare following surgery for neoplasm  Postmastectomy  lymphedema  Disorder of the skin and subcutaneous tissue related to radiation, unspecified  Muscle weakness (generalized)  Abnormal posture  ONSET DATE: 07/08/20  Rationale for Evaluation and Treatment Rehabilitation  SUBJECTIVE                                                                                                                                                                                    SUBJECTIVE STATEMENT:  I have been travelling some and have been having pain in the side of breast and in the armpit and the upper arm.  I have been using the sleeve some which maybe helps with the pain.   PERTINENT HISTORY:  2014 Rt breast cancer with lumpectomy with SLNB, radiation, and chemotherapy. Recurrence on the Rt with bil mastectomy with SLNB on 07/08/20 due to grade 2 ILC ER/PR positive.  Other history includes clotting disorder and DVT history. Had a SOZO score increased of 14.3 on 03/13/21 which decreased back to normal in 04/10/21.    PAIN:  Are you having pain? Yes NPRS scale: 2/10 up to 4/10 Pain location: breast, axilla, and upper arm  Pain orientation: Right  PAIN TYPE: aching Pain description: constant  Aggravating factors: use Relieving factors: not sure  PRECAUTIONS: Rt lymphedema risk  WEIGHT BEARING RESTRICTIONS No  FALLS:  Has patient fallen in last 6 months? No  LIVING ENVIRONMENT: Lives with: lives alone Lives in: House/apartment  OCCUPATION: works full time  LEISURE: not exercising  HAND DOMINANCE : left   PRIOR LEVEL OF FUNCTION: Independent  PATIENT GOALS see if I am okay    OBJECTIVE COGNITION:  Overall cognitive status: Within functional limits for tasks assessed   PALPATION: Pt has some increased soft nodular tissue in the axilla but this is bilaterally.  Rt lateral breast has enlarged pores and divot from surgery which pt reports has not changed since post op  OBSERVATIONS / OTHER ASSESSMENTS: extra skin lateral trunk bil  POSTURE:  WNL  UPPER EXTREMITY AROM/PROM:  Rt   Lt FLEXION    155   155 ABDUCTION    160   160 ER     Full   full IR     Full   Full Horizontal abduction   Full   Full   TODAY'S TREATMENT  Performed LDEX to compare to baseline: 6.2 which is still WNL - no signs of lymphedema - moved SOZO to 3 months out Reviewed HEP with performance of each per HEP below Discussed risk reduction and how to watch for lymphedema as well as use of sleeve with exercise, review of how to return to exercise post breast cancer Discussed aquatic therapy after pt mentioned difficulty exercising due to bil knee pain and overall pain - added to POC and mentioned benefits post breast cancer as well  PATIENT EDUCATION:  Education details: per above Person educated: Patient Education method: Consulting civil engineer, Demonstration, Verbal cues, and Handouts Education comprehension: verbalized understanding and returned demonstration  HOME EXERCISE PROGRAM: Access Code: [email or web address] URL: [email or web address] Date: 07/18/2021 Prepared by: Shan Levans  Exercises - Doorway Pec Stretch at 90 Degrees Abduction - 2 x daily - 7 x weekly - 1 sets - 3 reps - 20-30 seconds hold - Mermaid - 2 x daily - 7 x weekly - 1 sets - 5 reps - 5 second hold - Standing Shoulder Flexion Stretch on Wall - 2 x daily - 7 x weekly - 1 sets - 5 reps - 20-30 seconds hold  ASSESSMENT: CLINICAL IMPRESSION: Patient is a 62 y.o. female who was seen today for physical therapy evaluation and treatment for concerns of recurrent edema. Pt was told to let us know if her arm feels heavy which she states it does not post travel and high stress at home, but SOZO screen has only 0.3 increase from baseline so pt is relieved to see no signs of re-elevated SOZO.  Examined soft tissue and axilla thoroughly today which has some tightness in the pectoralis but overall no impairments except for tenderness.  Pt will attempt stretches at home and return if needed for MT  but pt should do well with stretches.  Pt will proceed with 2-4 sessions of aquatic therapy with transition to independent for full body TE and the ability to exercise with less pain.    OBJECTIVE IMPAIRMENTS decreased knowledge of condition, difficulty walking, and pain.   ACTIVITY LIMITATIONS lifting and squatting  PARTICIPATION LIMITATIONS: none  PERSONAL FACTORS SLNB, radiation hx are also affecting patient's functional outcome.   REHAB POTENTIAL: Excellent  CLINICAL DECISION MAKING: Stable/uncomplicated  EVALUATION COMPLEXITY: Low  GOALS: Goals reviewed with patient? Yes  SHORT TERM GOALS: Target date: 07/18/21  Pt will be ind with HEP for chest stretching Baseline: Goal status: MET  LONG TERM GOALS: Target date: 10/10/2021    Pt will be ind with HEP for aquatic exercise to improve overall pain and stiffness, decrease lymphedema risk, and prepare for knee surgery.  Baseline:  Goal status: INITIAL  PLAN: PT FREQUENCY: 1x/week  PT DURATION: 12 weeks  PLANNED INTERVENTIONS: Therapeutic exercises, Patient/Family education, and Aquatic Therapy  PLAN FOR NEXT SESSION: return to cancer rehab as needed and for SOZO screens,  aquatic therapy for knee pain, general UE/LE strengthening due to pain    Stark Bray, PT 07/18/2021, 12:07 PM

## 2021-07-23 ENCOUNTER — Encounter: Payer: Self-pay | Admitting: Orthopaedic Surgery

## 2021-07-23 ENCOUNTER — Ambulatory Visit (INDEPENDENT_AMBULATORY_CARE_PROVIDER_SITE_OTHER): Payer: 59

## 2021-07-23 ENCOUNTER — Ambulatory Visit: Payer: 59 | Admitting: Orthopaedic Surgery

## 2021-07-23 DIAGNOSIS — Z96641 Presence of right artificial hip joint: Secondary | ICD-10-CM

## 2021-07-23 NOTE — Progress Notes (Signed)
The patient comes in today in a month status post a right total hip arthroplasty.  She is very active and young appearing 61 year old female.  She says she is having little pain the pain in her sciatic region and the lateral aspect of right hip.  She reports good range of motion and strength.  She has a history of a left knee replacement.  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  She has known osteoarthritis of her right knee and is hesitant to proceed with knee replacement surgery given her history of RSD in the past.  This was in the 1990s.  Her right operative hip moves smoothly and fluidly.  An AP pelvis and lateral right hip shows a well-seated total hip arthroplasty with no complicating features.  She does have some slight sciatica on the right side and hip trochanteric bursitis.  I did offer outpatient physical therapy and she will think about this.  The pain has been off and on and somewhat getting better and if it does bother her enough she will let us know because we would set her up for outpatient physical therapy for the therapist to work on her right hip trochanteric area and the right sciatic area with any modalities per the therapist discretion.  At some point she is interested in proceeding with knee replacement surgery for the right knee and I did give her reassurance that I would address any potential for RSD with good pain medication regimen.  All question concerns were answered and addressed.  Follow-up thus far is as needed.

## 2021-07-28 ENCOUNTER — Ambulatory Visit: Payer: Self-pay

## 2021-07-29 ENCOUNTER — Encounter: Payer: Self-pay | Admitting: Podiatry

## 2021-07-29 ENCOUNTER — Ambulatory Visit: Payer: 59 | Admitting: Podiatry

## 2021-07-29 ENCOUNTER — Ambulatory Visit (INDEPENDENT_AMBULATORY_CARE_PROVIDER_SITE_OTHER): Payer: 59

## 2021-07-29 DIAGNOSIS — L603 Nail dystrophy: Secondary | ICD-10-CM | POA: Diagnosis not present

## 2021-07-29 DIAGNOSIS — M722 Plantar fascial fibromatosis: Secondary | ICD-10-CM

## 2021-07-29 MED ORDER — METHYLPREDNISOLONE 4 MG PO TBPK: ORAL_TABLET | ORAL | 0 refills | Status: DC

## 2021-07-29 MED ORDER — TRIAMCINOLONE ACETONIDE 40 MG/ML IJ SUSP
20.0000 mg | Freq: Once | INTRAMUSCULAR | Status: AC
  Administered 2021-07-29: 20 mg

## 2021-08-06 ENCOUNTER — Telehealth: Payer: Self-pay | Admitting: Pharmacist

## 2021-08-06 NOTE — Telephone Encounter (Signed)
Called pt and left message to follow up with Mounjaro tolerability. If tolerating well, will plan to increase to 7.'5mg'$  weekly dose.

## 2021-08-11 MED ORDER — MOUNJARO 7.5 MG/0.5ML ~~LOC~~ SOAJ: 7.5000 mg | SUBCUTANEOUS | 0 refills | Status: DC

## 2021-08-11 NOTE — Telephone Encounter (Addendum)
Spoke with pt. Reports tolerating Mounjaro well, losing some weight which she's excited about. Some constipation, unsure if related to med. Gave her 4th dose recently of the '5mg'$ . Reports PCP stopped her metformin to see if Mounjaro alone would keep her glucose controlled. Will send in next dose increase of 7.'5mg'$  and call pt in another month.

## 2021-08-14 ENCOUNTER — Other Ambulatory Visit (HOSPITAL_COMMUNITY)
Admission: RE | Admit: 2021-08-14 | Discharge: 2021-08-14 | Disposition: A | Discharge status: Home / Self Care | Payer: 59 | Source: Ambulatory Visit | Attending: Obstetrics & Gynecology | Admitting: Obstetrics & Gynecology

## 2021-08-14 ENCOUNTER — Encounter (HOSPITAL_BASED_OUTPATIENT_CLINIC_OR_DEPARTMENT_OTHER): Payer: Self-pay | Admitting: Obstetrics & Gynecology

## 2021-08-14 ENCOUNTER — Ambulatory Visit (INDEPENDENT_AMBULATORY_CARE_PROVIDER_SITE_OTHER): Payer: 59 | Admitting: Obstetrics & Gynecology

## 2021-08-14 VITALS — BP 130/78 | HR 74 | Ht 67.5 in | Wt 217.4 lb

## 2021-08-14 DIAGNOSIS — N898 Other specified noninflammatory disorders of vagina: Secondary | ICD-10-CM | POA: Diagnosis present

## 2021-08-14 DIAGNOSIS — N398 Other specified disorders of urinary system: Secondary | ICD-10-CM | POA: Diagnosis not present

## 2021-08-14 DIAGNOSIS — Z853 Personal history of malignant neoplasm of breast: Secondary | ICD-10-CM | POA: Diagnosis not present

## 2021-08-14 DIAGNOSIS — Z01419 Encounter for gynecological examination (general) (routine) without abnormal findings: Secondary | ICD-10-CM | POA: Diagnosis not present

## 2021-08-14 DIAGNOSIS — E611 Iron deficiency: Secondary | ICD-10-CM | POA: Diagnosis not present

## 2021-08-14 DIAGNOSIS — Z9013 Acquired absence of bilateral breasts and nipples: Secondary | ICD-10-CM | POA: Diagnosis not present

## 2021-08-14 DIAGNOSIS — R159 Full incontinence of feces: Secondary | ICD-10-CM

## 2021-08-14 MED ORDER — VALACYCLOVIR HCL 500 MG PO TABS: 500.0000 mg | ORAL_TABLET | Freq: Every day | ORAL | 4 refills | Status: DC

## 2021-08-14 NOTE — Progress Notes (Signed)
61 y.o. G39P1001 Single White or Caucasian female here for annual exam.  Has been experiencing some issues with a vulvar issue she wants me to assess.  H/O HSV.  Lots of recent stressors.  Feels she has recent outbreak but symptoms persisting.  Father passed recently.  Had IV iron this past.  Hb was as low as 7.1.  Iron infusions have helped.  Has considered retiring early but not sure she can from financial standpoint.  Really unhappy with work.  Has some issues with chronic constipation.  Does have some issues with fecal incontinence.  Would like referral for this.  Did have colonoscopy and advised provider of constipation but prep was not adequate.  Follow up 3 years recommended.  Denies vaginal bleeding.  Lots of dryness.  H/o DVT and cannot use estrogen products.  Concerned about some current vaginal odor.  Has recurrent breast cancer and bilateral mastectomy with reconstruction.  Not happy with the way her breasts look.  No LMP recorded. Patient has had a hysterectomy.          Sexually active: No.  Smoker:  no  Health Maintenance: Pap:  02/20/2019 Negative History of abnormal Pap:  h/o HPV MMG:  10/17/2019, bilateral mastectomy in 2022 Colonoscopy:  05/09/2020, follow up 3 years BMD:   10/17/2019, T score -1.2 Screening Labs: done with PCP   reports that she has never smoked. She has never used smokeless tobacco. She reports current alcohol use. She reports that she does not currently use drugs after having used the following drugs: Marijuana.  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   Hypertension    Hypothyroidism    IBS (irritable bowel syndrome)    Mitral valve prolapse    Personal history of malignant neoplasm of breast    Thyroid disease     Past Surgical History:  Procedure Laterality Date   ABDOMINAL HYSTERECTOMY  2010   fibroids, h/o abnormal pap smears   AXILLARY SENTINEL NODE BIOPSY Right 07/08/2020   Procedure: RIGHT AXILLARY SENTINEL NODE BIOPSY;  Surgeon: Rolm Bookbinder, MD;  Location: [facility];  Service: General;  Laterality: Right;   BREAST RECONSTRUCTION WITH PLACEMENT OF TISSUE EXPANDER AND FLEX HD (ACELLULAR HYDRATED DERMIS) Bilateral 07/08/2020   Procedure: BILATERAL BREAST RECONSTRUCTION WITH  FLEX HD (ACELLULAR HYDRATED DERMIS),DIRECT IMPLANT RECONSTRUCTION;  Surgeon: Cindra Presume, MD;  Location: [facility];  Service: Plastics;  Laterality: Bilateral;   BREAST SURGERY Right 2015   right lumpectomy with reduction and lift   COLONOSCOPY     DENTAL SURGERY     ESOPHAGOGASTRODUODENOSCOPY     FOOT SURGERY     dislocated toe   LAPAROSCOPIC GASTRIC SLEEVE RESECTION  2014   LAPAROSCOPIC OOPHERECTOMY Bilateral 2016   REPLACEMENT  TOTAL KNEE Left 2018   also had 3 prior arthroscopies   TOTAL HIP ARTHROPLASTY Right 11/19/2020   Procedure: RIGHT TOTAL HIP ARTHROPLASTY ANTERIOR APPROACH;  Surgeon: Mcarthur Rossetti, MD;  Location: [facility];  Service: Orthopedics;  Laterality: Right;   TOTAL MASTECTOMY Bilateral 07/08/2020   Procedure: BILATERAL TOTAL MASTECTOMY;  Surgeon: Rolm Bookbinder, MD;  Location: [facility];  Service: General;  Laterality: Bilateral;   VENA CAVA FILTER PLACEMENT  2017    Current Outpatient Medications  Medication Sig Dispense Refill   albuterol (VENTOLIN HFA) 108 (90 Base) MCG/ACT inhaler Inhale 1 puff into the lungs every 6 (six) hours as  needed for wheezing or shortness of breath.     amLODipine (NORVASC) 5 MG tablet Take 5 mg by mouth daily.     anastrozole (ARIMIDEX) 1 MG tablet Take 1 tablet (1 mg total) by mouth daily. 90 tablet 3   CALCIUM CITRATE PO Take 2 tablets by mouth daily. Zinc  Copper Vit d  Manganese Sodium     Cholecalciferol (VITAMIN D-3) 25 MCG (1000 UT) CAPS Take 1,000 Units by mouth daily.     DULoxetine (CYMBALTA) 30 MG capsule 30 mg daily.     DULoxetine (CYMBALTA) 60 MG capsule Take 60 mg by mouth daily.     ELIQUIS 5 MG TABS tablet TAKE 1 TABLET(5 MG) BY MOUTH TWICE DAILY 180 tablet 2   fluticasone (FLONASE) 50 MCG/ACT nasal spray Place 2 sprays into both nostrils daily.     gabapentin (NEURONTIN) 600 MG tablet Take 600 mg by mouth 2 (two) times daily.     ipratropium (ATROVENT) 0.03 % nasal spray Place 2 sprays into both nostrils 2 (two) times daily.     Lactobacillus Rhamnosus, GG, (CULTURELLE PO) Take 1 capsule by mouth daily.     levothyroxine (SYNTHROID) 50 MCG tablet Take 50 mcg by mouth every morning.     pantoprazole (PROTONIX) 40 MG tablet Take 1 tablet (40 mg total) by mouth daily.     sennosides-docusate sodium (SENOKOT-S) 8.6-50 MG tablet Take 2 tablets by mouth daily.     tirzepatide (MOUNJARO) 7.5 MG/0.5ML Pen Inject 7.5 mg into the skin once a week. 2 mL 0   valACYclovir (VALTREX) 500 MG tablet Take 1 tablet (500 mg total) by mouth daily. 30 tablet 12   rosuvastatin (CRESTOR) 5 MG tablet Take 1 tablet (5 mg total) by mouth daily. 90 tablet 3   No current facility-administered medications for this visit.    Family History  Problem Relation Age of Onset   Hypertension Mother    Skin cancer Mother    Diabetes Father    Kidney disease Father    Dementia Father    Factor VIII deficiency Father    Bladder Cancer Maternal Grandmother        dx in late 60s; smoker   Stroke Maternal Grandmother    Lung cancer Maternal Grandfather    Colon cancer Maternal Grandfather 72   Thyroid  cancer Maternal Grandfather        dx in his 67s   Breast cancer Paternal Grandmother        dx in 28s   Prostate cancer Other        MGMs brother   ROS: Genitourinary:positive for vulvar irritation, no vaginal bleeding  Exam:   BP 130/78 (BP Location: Left Arm, Patient Position: Sitting, Cuff Size: Large)   Pulse 74   Ht 5' 7.5" (1.715 m)   Wt 217 lb 6.4 oz (98.6  kg)   BMI 33.55 kg/m   Height: 5' 7.5" (171.5 cm)  General appearance: alert, cooperative and appears stated age Head: Normocephalic, without obvious abnormality, atraumatic Neck: no adenopathy, supple, symmetrical, trachea midline and thyroid normal to inspection and palpation Lungs: clear to auscultation bilaterally Breasts: normal appearance, no masses or tenderness Heart: regular rate and rhythm Abdomen: soft, non-tender; bowel sounds normal; no masses,  no organomegaly Extremities: extremities normal, atraumatic, no cyanosis or edema Skin: Skin color, texture, turgor normal. No rashes or lesions Lymph nodes: Cervical, supraclavicular, and axillary nodes normal. No abnormal inguinal nodes palpated Neurologic: Grossly normal   Pelvic: External genitalia:  no lesions              Urethra:  normal appearing urethra with no masses, tenderness or lesions              Bartholins and Skenes: normal                 Vagina: normal appearing vagina with normal color and no discharge, no lesions              Cervix: absent              Pap taken: No. Bimanual Exam:  Uterus:  uterus absent              Adnexa: no mass, fullness, tenderness               Rectovaginal: Confirms               Anus:  normal sphincter tone, no lesions  Chaperone, Octaviano Batty, CMA, was present for exam.  Assessment/Plan: 1. Well woman exam with routine gynecological exam - Pap smear 02/20/2019 neg with neg HR HPV (but has hx of HR HPV in the past) - MMG 10/2019 - colonoscopy 05/2020.  Inadequate pap.  Follow up 3 years recommended. - BMD  10/2019 - vaccines reviewed/updated - screening blood work done with PCP  2. History of bilateral breast cancer - s/p mastectomy - mammograms not indicated  3. H/O bilateral mastectomy - surgeon options discussed if pt decides to have any additional breast surgery  4. Iron deficiency - CBC - Iron, TIBC and Ferritin Panel  5. Incontinence of feces, unspecified fecal incontinence type - Ambulatory referral to Physical Therapy  6. Vaginal odor - Cervicovaginal ancillary only( [place])   7.  H/o vulvar HSV - no visible lesions today.  Pt will take second 3 day course of anti-viral and let me know if symptoms not resolved. RF done today.  8.  Voiding dysfunction - has needed bladder neck stretching in the past.  Feels she is starting to have some changes with urine stream.  Desires referral.

## 2021-08-15 ENCOUNTER — Encounter: Payer: Self-pay | Admitting: Physical Therapy

## 2021-08-15 ENCOUNTER — Ambulatory Visit: Payer: 59 | Attending: Hematology and Oncology | Admitting: Physical Therapy

## 2021-08-15 DIAGNOSIS — Z483 Aftercare following surgery for neoplasm: Secondary | ICD-10-CM | POA: Insufficient documentation

## 2021-08-15 DIAGNOSIS — M6281 Muscle weakness (generalized): Secondary | ICD-10-CM | POA: Diagnosis present

## 2021-08-15 DIAGNOSIS — I972 Postmastectomy lymphedema syndrome: Secondary | ICD-10-CM | POA: Diagnosis present

## 2021-08-15 DIAGNOSIS — L599 Disorder of the skin and subcutaneous tissue related to radiation, unspecified: Secondary | ICD-10-CM | POA: Diagnosis present

## 2021-08-15 DIAGNOSIS — R293 Abnormal posture: Secondary | ICD-10-CM | POA: Insufficient documentation

## 2021-08-15 DIAGNOSIS — Z853 Personal history of malignant neoplasm of breast: Secondary | ICD-10-CM | POA: Diagnosis present

## 2021-08-15 LAB — CBC
Hematocrit: 39.8 % (ref 34.0–46.6)
Hemoglobin: 13.8 g/dL (ref 11.1–15.9)
MCH: 30.7 pg (ref 26.6–33.0)
MCHC: 34.7 g/dL (ref 31.5–35.7)
MCV: 89 fL (ref 79–97)
Platelets: 245 10*3/uL (ref 150–450)
RBC: 4.49 x10E6/uL (ref 3.77–5.28)
RDW: 12.9 % (ref 11.7–15.4)
WBC: 9.6 10*3/uL (ref 3.4–10.8)

## 2021-08-15 LAB — CERVICOVAGINAL ANCILLARY ONLY
Bacterial Vaginitis (gardnerella): NEGATIVE
Candida Glabrata: NEGATIVE
Candida Vaginitis: NEGATIVE
Comment: NEGATIVE
Comment: NEGATIVE
Comment: NEGATIVE

## 2021-08-15 LAB — IRON,TIBC AND FERRITIN PANEL
Ferritin: 149 ng/mL (ref 15–150)
Iron Saturation: 20 % (ref 15–55)
Iron: 67 ug/dL (ref 27–159)
Total Iron Binding Capacity: 328 ug/dL (ref 250–450)
UIBC: 261 ug/dL (ref 131–425)

## 2021-08-15 NOTE — Therapy (Signed)
OUTPATIENT PHYSICAL THERAPY TREATMENT NOTE   Patient Name: Sierra Perez MRN: [id number] DOB:[birth year], 61 y.o., female Today's Date: 08/15/2021  PCP: Wenda Low, MD REFERRING PROVIDER: Nicholas Lose, MD  END OF SESSION:   PT End of Session - 08/15/21 1342     Visit Number 2    Number of Visits 4    Date for PT Re-Evaluation 10/10/21    Authorization - Visit Number 2    Authorization - Number of Visits 23    PT Start Time 4765    PT Stop Time 1430    PT Time Calculation (min) 48 min    Activity Tolerance Patient tolerated treatment well    Behavior During Therapy WFL for tasks assessed/performed             Past Medical History:  Diagnosis Date   Anemia    Anxiety    Asthma    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, her-2 positive, ER/PR negative   Clotting disorder ([place])    testing was negative, h/o DVT while on treatment   Complex regional pain syndrome i of right lower limb    RDS   Diabetes mellitus without complication ([place])    DVT (deep venous thrombosis) ([place])    started in 2015, multiple   Family history of breast cancer    Family history of colon cancer    Fibroid    GERD (gastroesophageal reflux disease)    Heart murmur    HPV in female    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   Hypertension    Hypothyroidism    IBS (irritable bowel syndrome)    Mitral valve prolapse    Personal history of malignant neoplasm of breast    Thyroid disease    Past Surgical History:  Procedure Laterality Date   ABDOMINAL HYSTERECTOMY  2010   fibroids, h/o abnormal pap smears   AXILLARY SENTINEL NODE BIOPSY Right 07/08/2020   Procedure: RIGHT AXILLARY SENTINEL NODE BIOPSY;  Surgeon: Rolm Bookbinder, MD;  Location: [place];  Service: General;  Laterality: Right;   BREAST RECONSTRUCTION WITH PLACEMENT OF TISSUE EXPANDER AND FLEX HD (ACELLULAR HYDRATED DERMIS) Bilateral 07/08/2020   Procedure: BILATERAL BREAST RECONSTRUCTION WITH  FLEX HD  (ACELLULAR HYDRATED DERMIS),DIRECT IMPLANT RECONSTRUCTION;  Surgeon: Cindra Presume, MD;  Location: [facility];  Service: Plastics;  Laterality: Bilateral;   BREAST SURGERY Right 2015   right lumpectomy with reduction and lift   COLONOSCOPY     DENTAL SURGERY     ESOPHAGOGASTRODUODENOSCOPY     FOOT SURGERY     dislocated toe   LAPAROSCOPIC GASTRIC SLEEVE RESECTION  2014   LAPAROSCOPIC OOPHERECTOMY Bilateral 2016   REPLACEMENT TOTAL KNEE Left 2018   also had 3 prior arthroscopies   TOTAL HIP ARTHROPLASTY Right 11/19/2020   Procedure: RIGHT TOTAL HIP ARTHROPLASTY ANTERIOR APPROACH;  Surgeon: Mcarthur Rossetti, MD;  Location: [place];  Service: Orthopedics;  Laterality: Right;   TOTAL MASTECTOMY Bilateral 07/08/2020   Procedure: BILATERAL TOTAL MASTECTOMY;  Surgeon: Rolm Bookbinder, MD;  Location: [facility];  Service: General;  Laterality: Bilateral;   VENA CAVA FILTER PLACEMENT  2017   Patient Active Problem List   Diagnosis Date Noted   Obesity (BMI 30-39.9) 06/23/2021   Iron deficiency anemia 01/23/2021   Status post right hip replacement 12/03/2020   Status post total replacement of right hip 11/19/2020   Breast cancer ([place]) 07/08/2020   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  H/O total hysterectomy 06/11/2020   Genetic testing 05/27/2020   Chronic superficial gastritis 05/27/2020   Fibromyalgia 05/27/2020   GERD (gastroesophageal reflux disease) 05/27/2020   Prediabetes 05/27/2020   Complex regional pain syndrome I, unspecified 05/27/2020   Atypical lobular hyperplasia [facility]) of left breast 05/23/2020   History of therapeutic radiation 05/23/2020   Family history of breast cancer    Family history of colon cancer    Personal history of malignant neoplasm of breast    Unilateral primary osteoarthritis, right knee 02/26/2020   Unilateral primary osteoarthritis, 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   DVT (deep venous thrombosis) ([place]) 05/04/2018   Malignant neoplasm of  upper-outer quadrant of right breast in female, estrogen receptor negative ([place]) 05/03/2018   Obstructive sleep apnea (adult) (pediatric) 09/14/2017   Adenopathy 07/15/2017   B12 deficiency 07/15/2017   Disorder of tympanic membrane of right ear 07/15/2017   Recurrent UTI 03/09/2017   Bladder pain 02/25/2017   Proteinuria 02/25/2017   Hematoma of thigh, left, subsequent encounter 08/16/2016   At risk for obstructive sleep apnea 07/01/2016   Suspected sleep apnea 07/01/2016   Arthritis of left knee 05/26/2016   Localized primary osteoarthritis of lower leg, left 05/26/2016   Clotting disorder ([place]) 05/22/2016   Chronic anticoagulation 01/06/2016   Right-sided epistaxis 01/06/2016   Urinary tract infection, site not specified 08/12/2015   Abnormal Pap smear of vagina 05/06/2015   Follow-up examination, following other surgery 12/14/2013   Pelvic and perineal pain 11/28/2013   Anxiety and depression 11/16/2013   H/O bariatric surgery 11/16/2013   Atrophic vaginitis 11/13/2013   Tear of lateral cartilage or meniscus of knee, current 04/14/2013   Allergic rhinitis 01/16/2013   Female genital symptoms 11/10/2012   Symptomatic menopausal or female climacteric states 11/10/2012   Disequilibrium 06/09/2012   Morbid obesity ([place]) 02/09/2012   Vitamin D deficiency 09/13/2008   Preop examination 04/20/2008   Hypothyroidism 05/20/2007    REFERRING DIAG:  C50.411,Z17.1 (ICD-10-CM) - Malignant neoplasm of upper-outer quadrant of right breast in female, estrogen receptor negative ([place], lymphedema  THERAPY DIAG:  Aftercare following surgery for neoplasm  Postmastectomy lymphedema  Disorder of the skin and subcutaneous tissue related to radiation, unspecified  Muscle weakness (generalized)  Abnormal posture  History of malignant neoplasm of right breast  Rationale for Evaluation and Treatment Rehabilitation  PERTINENT HISTORY: 2014 Rt breast cancer with lumpectomy with SLNB,  radiation, and chemotherapy. Recurrence on the Rt with bil mastectomy with SLNB on 07/08/20 due to grade 2 ILC ER/PR positive.  Other history includes clotting disorder and DVT history. Had a SOZO score increased of 14.3 on 03/13/21 which decreased back to normal in 04/10/21  PRECAUTIONS: Rt lymphedema risk  SUBJECTIVE: I joined [employer] and I even went this AM to try out some machines.   PAIN:  Are you having pain? No, not right now but I typically have a low level of ache somewhere.    OBJECTIVE: (objective measures completed at initial evaluation unless otherwise dated)  OBJECTIVE COGNITION:            Overall cognitive status: Within functional limits for tasks assessed    PALPATION: Pt has some increased soft nodular tissue in the axilla but this is bilaterally.  Rt lateral breast has enlarged pores and divot from surgery which pt reports has not changed since post op   OBSERVATIONS / OTHER ASSESSMENTS: extra skin lateral trunk bil   POSTURE: WNL   UPPER EXTREMITY AROM/PROM:  Rt                                 Lt FLEXION                                             155                              155 ABDUCTION                                       160                              160 ER                                                       Full                              full IR                                                         Full                              Full Horizontal abduction                           Full                              Full     TODAY'S TREATMENT   08/15/21: Pt arrives for aquatic physical therapy. Treatment took place in 3.5-5.5 feet of water. Water temperature was 92 degrees F. Pt entered the pool via . Pt was educated in water principles and how we would be using them and how she can progress them in the future. Pt verbally understood all concepts and returned demo. Seated water bench with  75% submersion Pt performed seated LE AROM exercises 20x in all planes, 75% water walking 6x in each direction with natural arm movements. Seated decompression with large noodle behind patient. Standing against the wall in a mini squat to get most of her shoulders submerged: shoulder flex/ext 10x, shoulder abd/add under the water 10x    Performed LDEX to compare to baseline: 6.2 which is still WNL - no signs of lymphedema - moved SOZO to 3 months out Reviewed HEP with performance of each per HEP below Discussed risk reduction and how to watch for lymphedema as well as use of sleeve with exercise, review of how to return to exercise post breast cancer Discussed  aquatic therapy after pt mentioned difficulty exercising due to bil knee pain and overall pain - added to POC and mentioned benefits post breast cancer as well   PATIENT EDUCATION:  Education details: per above Person educated: Patient Education method: Consulting civil engineer, Demonstration, Verbal cues, and Handouts Education comprehension: verbalized understanding and returned demonstration   HOME EXERCISE PROGRAM: Access Code: [email or web address] URL: [email or web address] Date: 07/18/2021 Prepared by: Shan Levans  Exercises - Doorway Pec Stretch at 90 Degrees Abduction - 2 x daily - 7 x weekly - 1 sets - 3 reps - 20-30 seconds hold - Mermaid - 2 x daily - 7 x weekly - 1 sets - 5 reps - 5 second hold - Standing Shoulder Flexion Stretch on Wall - 2 x daily - 7 x weekly - 1 sets - 5 reps - 20-30 seconds hold   ASSESSMENT: CLINICAL IMPRESSION: Pt arrives for first aquatic PT session with  pain. Pt was educated in water principles including how to use them currently and how to progress down the road. Pt was able to participate at a beginner level today without any issue.   OBJECTIVE IMPAIRMENTS decreased knowledge of condition, difficulty walking, and pain.    ACTIVITY LIMITATIONS lifting and squatting   PARTICIPATION LIMITATIONS:  none   PERSONAL FACTORS SLNB, radiation hx are also affecting patient's functional outcome.    REHAB POTENTIAL: Excellent   CLINICAL DECISION MAKING: Stable/uncomplicated   EVALUATION COMPLEXITY: Low   GOALS: Goals reviewed with patient? Yes   SHORT TERM GOALS: Target date: 07/18/21   Pt will be ind with HEP for chest stretching Baseline: Goal status: MET   LONG TERM GOALS: Target date: 10/10/2021     Pt will be ind with HEP for aquatic exercise to improve overall pain and stiffness, decrease lymphedema risk, and prepare for knee surgery.  Baseline:  Goal status: INITIAL   PLAN: PT FREQUENCY: 1x/week   PT DURATION: 12 weeks   PLANNED INTERVENTIONS: Therapeutic exercises, Patient/Family education, and Aquatic Therapy   PLAN FOR NEXT SESSION: return to cancer rehab as needed and for SOZO screens,  aquatic therapy for knee pain, general UE/LE strengthening due to pain      Myrene Galas, PTA 08/15/21 3:17 PM            Ashwika Freels, PTA 08/15/2021, 3:17 PM

## 2021-08-19 ENCOUNTER — Telehealth (HOSPITAL_COMMUNITY): Payer: Self-pay | Admitting: Internal Medicine

## 2021-08-19 NOTE — Telephone Encounter (Signed)
Pt does not need further appts in [facility], please cancel

## 2021-08-19 NOTE — Telephone Encounter (Signed)
Pt last visit she spoke w/DB, stating she doesn't need a f/u appt, pt is scheduled in July, please advise

## 2021-08-20 ENCOUNTER — Encounter (HOSPITAL_BASED_OUTPATIENT_CLINIC_OR_DEPARTMENT_OTHER): Payer: Self-pay | Admitting: Obstetrics & Gynecology

## 2021-08-22 ENCOUNTER — Other Ambulatory Visit: Payer: Self-pay

## 2021-08-22 ENCOUNTER — Telehealth: Payer: Self-pay

## 2021-08-22 DIAGNOSIS — Z1211 Encounter for screening for malignant neoplasm of colon: Secondary | ICD-10-CM

## 2021-08-22 NOTE — Telephone Encounter (Signed)
Thornton Park, MD  Megan Salon, MD; Carl Best, RN  Vinnie Level,   Thanks so much your note about this patient. I reviewed her colonoscopy from 2022. Given her poor prep, I think repeating the colonoscopy with a 2 day bowel prep now would be very appropriate.   Donita Brooks,  Would you please arrange for colonoscopy with 2 day bowel prep? I'd like to see her in the office first if she has any questions, concerns, or any ongoing GI symptoms.   Thank you.   KLB

## 2021-08-22 NOTE — Telephone Encounter (Signed)
Patient has been scheduled for a follow up with Dr. Tarri Glenn on 09/25/21 at 10:50 am to discuss colon, chronic constipation, and possible gluten allergy. Colonoscopy scheduled for 10/02/21 at 2:00 pm. She would also like to discuss possible EGD given her history of stomach polyps. She prefers to wait and discuss prep options with Dr. Tarri Glenn before I send any instructions or prep to pharmacy. Amb ref placed. She is also diabetic & on blood thinners. Medication is managed by her oncologist Dr. Lindi Adie. Will route separate letter for clearance. Pt advised to call back with any further questions.

## 2021-08-27 ENCOUNTER — Encounter (HOSPITAL_COMMUNITY): Payer: 59 | Admitting: Internal Medicine

## 2021-08-27 ENCOUNTER — Other Ambulatory Visit (HOSPITAL_COMMUNITY): Payer: 59

## 2021-09-01 ENCOUNTER — Other Ambulatory Visit: Payer: Self-pay

## 2021-09-01 ENCOUNTER — Ambulatory Visit: Payer: 59 | Attending: Obstetrics & Gynecology | Admitting: Physical Therapy

## 2021-09-01 DIAGNOSIS — M6281 Muscle weakness (generalized): Secondary | ICD-10-CM | POA: Insufficient documentation

## 2021-09-01 DIAGNOSIS — R279 Unspecified lack of coordination: Secondary | ICD-10-CM | POA: Diagnosis not present

## 2021-09-01 DIAGNOSIS — R293 Abnormal posture: Secondary | ICD-10-CM | POA: Insufficient documentation

## 2021-09-01 DIAGNOSIS — R159 Full incontinence of feces: Secondary | ICD-10-CM | POA: Insufficient documentation

## 2021-09-01 NOTE — Therapy (Addendum)
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, her-2 positive, ER/PR negative   Clotting disorder ([place])    testing was negative, h/o DVT while on treatment   Complex regional pain syndrome i of right lower limb    RDS   Diabetes mellitus without complication ([place])    DVT (deep venous thrombosis) ([place])    started in 2015, multiple   Family history of breast cancer    Family history of colon cancer    Fibroid    GERD (gastroesophageal reflux disease)    Heart murmur    HPV in female    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   Hypertension    Hypothyroidism    IBS (irritable bowel syndrome)    Mitral valve prolapse    Personal history of malignant neoplasm of breast    Thyroid disease    Past Surgical History:  Procedure Laterality Date   ABDOMINAL HYSTERECTOMY  2010   fibroids, h/o abnormal pap smears   AXILLARY SENTINEL NODE BIOPSY Right 07/08/2020   Procedure: RIGHT AXILLARY SENTINEL NODE BIOPSY;  Surgeon: Rolm Bookbinder, MD;  Location: [place];  Service: General;  Laterality: Right;   BREAST RECONSTRUCTION WITH PLACEMENT OF TISSUE EXPANDER AND FLEX HD (ACELLULAR HYDRATED DERMIS) Bilateral 07/08/2020   Procedure: BILATERAL BREAST RECONSTRUCTION WITH  FLEX HD (ACELLULAR HYDRATED DERMIS),DIRECT IMPLANT RECONSTRUCTION;  Surgeon: Cindra Presume, MD;  Location: [place];  Service: Plastics;  Laterality: Bilateral;   BREAST SURGERY Right 2015   right lumpectomy with reduction and lift   COLONOSCOPY     DENTAL  SURGERY     ESOPHAGOGASTRODUODENOSCOPY     FOOT SURGERY     dislocated toe   LAPAROSCOPIC GASTRIC SLEEVE RESECTION  2014   LAPAROSCOPIC OOPHERECTOMY Bilateral 2016   REPLACEMENT TOTAL KNEE Left 2018   also had 3 prior arthroscopies   TOTAL HIP ARTHROPLASTY Right 11/19/2020   Procedure: RIGHT TOTAL HIP ARTHROPLASTY ANTERIOR APPROACH;  Surgeon: Mcarthur Rossetti, MD;  Location: [place];  Service: Orthopedics;  Laterality: Right;   TOTAL MASTECTOMY Bilateral 07/08/2020   Procedure: BILATERAL TOTAL MASTECTOMY;  Surgeon: Rolm Bookbinder, MD;  Location: [place];  Service: General;  Laterality: Bilateral;   VENA CAVA FILTER PLACEMENT  2017   Patient Active Problem List   Diagnosis Date Noted   Obesity (BMI 30-39.9) 06/23/2021   Iron deficiency anemia 01/23/2021   Status post right hip replacement 12/03/2020   Status post total replacement of right hip 11/19/2020   Breast cancer ([place]) 07/08/2020   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   H/O total hysterectomy 06/11/2020   Genetic testing 05/27/2020   Chronic superficial gastritis 05/27/2020   Fibromyalgia 05/27/2020   GERD (gastroesophageal reflux disease) 05/27/2020   Prediabetes 05/27/2020   Complex regional pain syndrome I, unspecified 05/27/2020   Atypical lobular  hyperplasia [facility]) of left breast 05/23/2020   History of therapeutic radiation 05/23/2020   Family history of breast cancer    Family history of colon cancer    Personal history of malignant neoplasm of breast    Unilateral primary osteoarthritis, right knee 02/26/2020   Unilateral primary osteoarthritis, 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   DVT (deep venous thrombosis) ([place]) 05/04/2018   Malignant neoplasm of upper-outer quadrant of right breast in female, estrogen receptor negative ([place]) 05/03/2018   Obstructive sleep apnea (adult) (pediatric) 09/14/2017   Adenopathy 07/15/2017   B12 deficiency 07/15/2017   Disorder of tympanic membrane of right ear  07/15/2017   Recurrent UTI 03/09/2017   Bladder pain 02/25/2017   Proteinuria 02/25/2017   Hematoma of thigh, left, subsequent encounter 08/16/2016   At risk for obstructive sleep apnea 07/01/2016   Suspected sleep apnea 07/01/2016   Arthritis of left knee 05/26/2016   Localized primary osteoarthritis of lower leg, left 05/26/2016   Clotting disorder ([place]) 05/22/2016   Chronic anticoagulation 01/06/2016   Right-sided epistaxis 01/06/2016   Urinary tract infection, site not specified 08/12/2015   Abnormal Pap smear of vagina 05/06/2015   Follow-up examination, following other surgery 12/14/2013   Pelvic and perineal pain 11/28/2013   Anxiety and depression 11/16/2013   H/O bariatric surgery 11/16/2013   Atrophic vaginitis 11/13/2013   Tear of lateral cartilage or meniscus of knee, current 04/14/2013   Allergic rhinitis 01/16/2013   Female genital symptoms 11/10/2012   Symptomatic menopausal or female climacteric states 11/10/2012   Disequilibrium 06/09/2012   Morbid obesity ([place]) 02/09/2012   Vitamin D deficiency 09/13/2008   Preop examination 04/20/2008   Hypothyroidism 05/20/2007    PCP: Wenda Low, MD  REFERRING PROVIDER: Megan Salon, MD  REFERRING DIAG: R15.9 (ICD-10-CM) - Incontinence of feces, unspecified fecal incontinence type  THERAPY DIAG:  Muscle weakness (generalized)  Unspecified lack of coordination  Abnormal posture  Rationale for Evaluation and Treatment H[place]  ONSET DATE: years  SUBJECTIVE:                                                                                                                                                                                           SUBJECTIVE STATEMENT: For years has had chronic diarrhea but the past year or two has had chronic constipation and now it varies with constipation and diarrhea. Pt does have incontinence with this recently, does think this is related to gluten intake, since she has  altered gluten intake has seen improvement.   Fluid intake: Yes: 50 oz of water      PAIN:  Are you  having pain? No   PRECAUTIONS: Rt lymphedema risk   WEIGHT BEARING RESTRICTIONS No   FALLS:  Has patient fallen in last 6 months? No   LIVING ENVIRONMENT: Lives with: lives alone Lives in: House/apartment   OCCUPATION: works full time    PATIENT GOALS to have more regular bowel movements  PERTINENT HISTORY:  H/O total hysterectomy, High risk HPV infection, Breast cancer ([place]), Status post total replacement of right hip, DVT, Postmastectomy lymphedema Sexual abuse: No  BOWEL MOVEMENT Pain with bowel movement: No Type of bowel movement:Type (Bristol Stool Scale) 1-2, Frequency not regular, and Strain Yes Fully empty rectum: Yes: sometimes Leakage: Yes:   Pads: No Fiber supplement: Yes: reports "I am on medication but do not take a supplement".   URINATION Pain with urination: No Fully empty bladder: Yes:   Stream: Strong Urgency: No Frequency: not quicker than every 2 hours Leakage:  no Pads: No    OBJECTIVE:   DIAGNOSTIC FINDINGS:   COGNITION:  Overall cognitive status: Within functional limits for tasks assessed     SENSATION:  Light touch: Appears intact  Proprioception: Appears intact  MUSCLE LENGTH: Bil hamstrings and adductors limited by 25%               POSTURE: posterior pelvic tilt    LUMBARAROM/PROM  A/PROM A/PROM  eval  Flexion Limited by 25%  Extension WFL  Right lateral flexion Limited by 25%  Left lateral flexion Limited by 25%  Right rotation WFL  Left rotation WFL   (Blank rows = not tested)  LOWER EXTREMITY ROM:  WFL  LOWER EXTREMITY MMT:  WFL   PALPATION:   General  no TTP but fascial restrictions noted in all quadrants of abdomen                External Perineal Exam deferred                              Internal Pelvic Floor deferred   Patient confirms identification and approves PT to assess internal pelvic  floor and treatment Yes  PELVIC MMT:   MMT eval  Vaginal   Internal Anal Sphincter   External Anal Sphincter   Puborectalis   Diastasis Recti   (Blank rows = not tested)        TONE: deferred  PROLAPSE: Deferred   TODAY'S TREATMENT  EVAL Examination completed, findings reviewed, pt educated on POC, types of fiber, abdominal massage, voiding and breathing mechanics. Pt motivated to participate in PT and agreeable to attempt recommendations.     PATIENT EDUCATION:  Education details: POC, types of fiber, abdominal massage, voiding and breathing mechanics. Person educated: Patient Education method: Explanation, Demonstration, Tactile cues, Verbal cues, and Handouts Education comprehension: verbalized understanding and returned demonstration   HOME EXERCISE PROGRAM: POC, types of fiber, abdominal massage, voiding and breathing mechanics.  ASSESSMENT:  CLINICAL IMPRESSION: Patient is a 61 y.o. female  who was seen today for physical therapy evaluation and treatment for fecal incontinence and constipation. Pt reports she has struggled with constipation and diarrhea variations for years, has had incontinence with liquid and formed stools but since decreasing gluten this has improved a lot. Pt reports she is mostly struggling with constipation now with type 1-2 stools, frequent straining. Pt deferred internal rectal assessment this date and would like to try something else at home first, if no progress then can attempt internal if needed. Pt found to have  mild fascial restrictions in abdomen. Pt educated on POC, types of fiber, abdominal massage, voiding and breathing mechanics. Pt denied additional questions, demonstrated abdominal massage on pt for improved carry over for home. Pt would benefit from additional PT to further address deficits.     OBJECTIVE IMPAIRMENTS decreased coordination, decreased endurance, decreased strength, increased fascial restrictions, improper body  mechanics, and postural dysfunction.   ACTIVITY LIMITATIONS continence  PARTICIPATION LIMITATIONS: community activity  PERSONAL FACTORS Fitness, Past/current experiences, Time since onset of injury/illness/exacerbation, and 1 comorbidity: medical history  are also affecting patient's functional outcome.   REHAB POTENTIAL: Good  CLINICAL DECISION MAKING: Stable/uncomplicated  EVALUATION COMPLEXITY: Low   GOALS: Goals reviewed with patient? Yes  SHORT TERM GOALS: Target date: 09/29/2021  Pt to be I with HEP.  Baseline: Goal status: INITIAL  2.  Pt will report her BMs are complete due to improved bowel habits and evacuation techniques.  Baseline:  Goal status: INITIAL  3.  Pt will report 3 BMs per week due to improved muscle tone and coordination with BMs.  Baseline:  Goal status: INITIAL   LONG TERM GOALS: Target date:  12/02/21    Pt to be I with advanced HEP.  Baseline:  Goal status: INITIAL  2.  Pt will report 3-5 BMs per week due to improved muscle tone and coordination with BMs.  Baseline:  Goal status: INITIAL  3.  Pt to demonstrate improved strength at pelvic floor to at least 3/5 for decreased fecal incontinence.  Baseline:  Goal status: INITIAL   PLAN: PT FREQUENCY: every other week  PT DURATION:  5 sessions  PLANNED INTERVENTIONS: Therapeutic exercises, Therapeutic activity, Neuromuscular re-education, Patient/Family education, Self Care, Joint mobilization, Dry Needling, Spinal mobilization, Cryotherapy, Moist heat, scar mobilization, Taping, Biofeedback, and Manual therapy  PLAN FOR NEXT SESSION: internal if needed and pt consents, abdominal massage, breathing mechanics vs. Stretching hips/back/core  Stacy Gardner, PT, DPT 07/31/231:03 PM   PHYSICAL THERAPY DISCHARGE SUMMARY  Visits from Start of Care: 1 for PFPT  Current functional level related to goals / functional outcomes: Unable to formally reassess as pt called to cancel remaining  appointments in order to complete additional medical needs ad advise from her doctor.     Remaining deficits: Unable to formally reassess   Education / Equipment: HEP   Patient agrees to discharge. Patient goals were partially met. Patient is being discharged due to the physician's request. Thank you for referral.    Stacy Gardner, PT, DPT 08/28/232:59 PM

## 2021-09-01 NOTE — Patient Instructions (Signed)
Types of Fiber  There are two main types of fiber:  insoluble and soluble.  Both of these types can prevent and relieve constipation and diarrhea, although some people find one or the other to be more easily digested.  This handout details information about both types of fiber.  Insoluble Fiber       Functions of Insoluble Fiber moves bulk through the intestines  controls and balances the pH (acidity) in the intestines       Benefits of Insoluble Fiber promotes regular bowel movement and prevents constipation  removes fecal waste through colon in less time  keeps an optimal pH in intestines to prevent microbes from producing cancer substances, therefore preventing colon cancer        Food Sources of Insoluble Fiber whole-wheat products  wheat bran "miller's bran" corn bran  flax seed or other seeds vegetables such as green beans, broccoli, cauliflower and potato skins  fruit skins and root vegetable skins  popcorn brown rice  Soluble Fiber       Functions of Soluble Fiber  holds water in the colon to bulk and soften the stool prolongs stomach emptying time so that sugar is released and absorbed more slowly        Benefits of Soluble Fiber lowers total cholesterol and LDL cholesterol (the bad cholesterol) therefore reducing the risk of heart disease  regulates blood sugar for people with diabetes       Food Sources of Soluble Fiber oat/oat bran dried beans and peas  nuts  barley  flax seed or other seeds fruits such as oranges, pears, peaches, and apples  vegetables such as carrots  psyllium husk  prunes      5 mins in the morning and evening while laying in bed on back.   Balloon Breathing: when you feel the need to strain while having a bowel movement, make closed fist with hand, bring to mouth, breathe out like blowing up a balloon.

## 2021-09-02 ENCOUNTER — Encounter: Payer: Self-pay | Admitting: Podiatry

## 2021-09-02 ENCOUNTER — Ambulatory Visit: Payer: 59 | Admitting: Podiatry

## 2021-09-02 DIAGNOSIS — M722 Plantar fascial fibromatosis: Secondary | ICD-10-CM

## 2021-09-02 MED ORDER — TRIAMCINOLONE ACETONIDE 40 MG/ML IJ SUSP
20.0000 mg | Freq: Once | INTRAMUSCULAR | Status: AC
  Administered 2021-09-02: 20 mg

## 2021-09-02 NOTE — Progress Notes (Signed)
She presents today for a follow-up of her Planter fasciitis states that the heel feels much better but she still has pain along the medial aspect of the foot as she points to the left foot at the navicular tuberosity area.  Objective: Vital signs are stable she is alert and oriented x3 she has tenderness on palpation of the posterior tibial tendon and its navicular insertion.  She also has continued tenderness on palpation medial calcaneal tubercle and then much more supple than previously noted.  Assessment: Pain in limb secondary to plan fasciitis to resolving.  Compensatory posterior tibial tendinitis.  Plan: I reinjected the heel today 20 mg Kenalog 5 mg Marcaine point of maximal tenderness.  Hopefully this will help alleviate her compensatory symptoms in the tendon as well.  Follow-up with her in about 6 weeks may need to consider orthotics

## 2021-09-03 ENCOUNTER — Telehealth: Payer: Self-pay

## 2021-09-03 ENCOUNTER — Ambulatory Visit: Payer: 59 | Admitting: Physical Therapy

## 2021-09-03 NOTE — Telephone Encounter (Signed)
Nicholas Lose, MD  Carl Best, RN  She can be off for 48 hours prior to procedure.  Resume after procedure is done  VG

## 2021-09-09 ENCOUNTER — Telehealth: Payer: Self-pay | Admitting: Pharmacist

## 2021-09-09 MED ORDER — MOUNJARO 7.5 MG/0.5ML ~~LOC~~ SOAJ: 7.5000 mg | SUBCUTANEOUS | 0 refills | Status: DC

## 2021-09-09 NOTE — Addendum Note (Signed)
Addended by: Samia Kukla E on: 09/09/2021 04:47 PM   Modules accepted: Orders

## 2021-09-09 NOTE — Telephone Encounter (Signed)
Pt returned call. Has given 4 doses of Mounjaro 7.'5mg'$ . Doesn't have much of an appetite, some days doesn't eat until 2-3pm which is unusual for her, typically had more of a binge eating habit in the past so this is the first time she hasn't thought about food regularly. Has been tolerating med well, weight this AM down to 206 lbs (last in office weight 217 lbs 3-4 weeks ago). Will continue 7.'5mg'$  dose for another month, discussed keeping up her nutrition and trying to eat smaller more frequent portions throughout the day. She has PharmD follow up in a few weeks, will plan to recheck A1c at that time. Pt happy to stay at 7.'5mg'$  dose unless A1c requires further reduction at that time (PCP had stopped metformin, most recent A1c well controlled at 7).

## 2021-09-09 NOTE — Telephone Encounter (Signed)
Called pt and left message to follow up with Mounjaro tolerability. If tolerating well, will increase to '10mg'$  weekly dose.

## 2021-09-10 ENCOUNTER — Encounter: Payer: Self-pay | Admitting: Physical Therapy

## 2021-09-10 ENCOUNTER — Ambulatory Visit: Payer: 59 | Attending: Hematology and Oncology | Admitting: Physical Therapy

## 2021-09-10 DIAGNOSIS — R279 Unspecified lack of coordination: Secondary | ICD-10-CM | POA: Diagnosis present

## 2021-09-10 DIAGNOSIS — M6281 Muscle weakness (generalized): Secondary | ICD-10-CM | POA: Diagnosis present

## 2021-09-10 DIAGNOSIS — R293 Abnormal posture: Secondary | ICD-10-CM | POA: Diagnosis present

## 2021-09-10 DIAGNOSIS — Z483 Aftercare following surgery for neoplasm: Secondary | ICD-10-CM | POA: Insufficient documentation

## 2021-09-10 NOTE — Therapy (Deleted)
OUTPATIENT PHYSICAL THERAPY FEMALE PELVIC EVALUATION   Patient Name: Sierra Perez MRN: [id number] DOB:1960/12/10, 61 y.o., female Today's Date: 09/10/2021   PT End of Session - 09/10/21 0845     Visit Number 4    Number of Visits 4    Date for PT Re-Evaluation 12/02/21    Authorization Type [employer]    Authorization - Visit Number 3    Authorization - Number of Visits 23    PT Start Time 0845    Activity Tolerance Patient tolerated treatment well    Behavior During Therapy [facility] for tasks assessed/performed             Past Medical History:  Diagnosis Date   Anemia    Anxiety    Asthma    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, her-2 positive, ER/PR negative   Clotting disorder ([place])    testing was negative, h/o DVT while on treatment   Complex regional pain syndrome i of right lower limb    RDS   Diabetes mellitus without complication ([place])    DVT (deep venous thrombosis) ([place])    started in 2015, multiple   Family history of breast cancer    Family history of colon cancer    Fibroid    GERD (gastroesophageal reflux disease)    Heart murmur    HPV in female    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   Hypertension    Hypothyroidism    IBS (irritable bowel syndrome)    Mitral valve prolapse    Personal history of malignant neoplasm of breast    Thyroid disease    Past Surgical History:  Procedure Laterality Date   ABDOMINAL HYSTERECTOMY  2010   fibroids, h/o abnormal pap smears   AXILLARY SENTINEL NODE BIOPSY Right 07/08/2020   Procedure: RIGHT AXILLARY SENTINEL NODE BIOPSY;  Surgeon: Rolm Bookbinder, MD;  Location: [place];  Service: General;  Laterality: Right;   BREAST RECONSTRUCTION WITH PLACEMENT OF TISSUE EXPANDER AND FLEX HD (ACELLULAR HYDRATED DERMIS) Bilateral 07/08/2020   Procedure: BILATERAL BREAST RECONSTRUCTION WITH  FLEX HD (ACELLULAR HYDRATED DERMIS),DIRECT IMPLANT RECONSTRUCTION;  Surgeon: Cindra Presume, MD;  Location: [place];   Service: Plastics;  Laterality: Bilateral;   BREAST SURGERY Right 2015   right lumpectomy with reduction and lift   COLONOSCOPY     DENTAL SURGERY     ESOPHAGOGASTRODUODENOSCOPY     FOOT SURGERY     dislocated toe   LAPAROSCOPIC GASTRIC SLEEVE RESECTION  2014   LAPAROSCOPIC OOPHERECTOMY Bilateral 2016   REPLACEMENT TOTAL KNEE Left 2018   also had 3 prior arthroscopies   TOTAL HIP ARTHROPLASTY Right 11/19/2020   Procedure: RIGHT TOTAL HIP ARTHROPLASTY ANTERIOR APPROACH;  Surgeon: Mcarthur Rossetti, MD;  Location: [place];  Service: Orthopedics;  Laterality: Right;   TOTAL MASTECTOMY Bilateral 07/08/2020   Procedure: BILATERAL TOTAL MASTECTOMY;  Surgeon: Rolm Bookbinder, MD;  Location: [place];  Service: General;  Laterality: Bilateral;   VENA CAVA FILTER PLACEMENT  2017   Patient Active Problem List   Diagnosis Date Noted   Obesity (BMI 30-39.9) 06/23/2021   Iron deficiency anemia 01/23/2021   Status post right hip replacement 12/03/2020   Status post total replacement of right hip 11/19/2020   Breast cancer ([place]) 07/08/2020   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   H/O total hysterectomy 06/11/2020   Genetic testing 05/27/2020   Chronic superficial gastritis 05/27/2020   Fibromyalgia 05/27/2020   GERD (  gastroesophageal reflux disease) 05/27/2020   Prediabetes 05/27/2020   Complex regional pain syndrome I, unspecified 05/27/2020   Atypical lobular hyperplasia [facility]) of left breast 05/23/2020   History of therapeutic radiation 05/23/2020   Family history of breast cancer    Family history of colon cancer    Personal history of malignant neoplasm of breast    Unilateral primary osteoarthritis, right knee 02/26/2020   Unilateral primary osteoarthritis, 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   DVT (deep venous thrombosis) ([place]) 05/04/2018   Malignant neoplasm of upper-outer quadrant of right breast in female, estrogen receptor negative ([place]) 05/03/2018   Obstructive  sleep apnea (adult) (pediatric) 09/14/2017   Adenopathy 07/15/2017   B12 deficiency 07/15/2017   Disorder of tympanic membrane of right ear 07/15/2017   Recurrent UTI 03/09/2017   Bladder pain 02/25/2017   Proteinuria 02/25/2017   Hematoma of thigh, left, subsequent encounter 08/16/2016   At risk for obstructive sleep apnea 07/01/2016   Suspected sleep apnea 07/01/2016   Arthritis of left knee 05/26/2016   Localized primary osteoarthritis of lower leg, left 05/26/2016   Clotting disorder ([place]) 05/22/2016   Chronic anticoagulation 01/06/2016   Right-sided epistaxis 01/06/2016   Urinary tract infection, site not specified 08/12/2015   Abnormal Pap smear of vagina 05/06/2015   Follow-up examination, following other surgery 12/14/2013   Pelvic and perineal pain 11/28/2013   Anxiety and depression 11/16/2013   H/O bariatric surgery 11/16/2013   Atrophic vaginitis 11/13/2013   Tear of lateral cartilage or meniscus of knee, current 04/14/2013   Allergic rhinitis 01/16/2013   Female genital symptoms 11/10/2012   Symptomatic menopausal or female climacteric states 11/10/2012   Disequilibrium 06/09/2012   Morbid obesity ([place]) 02/09/2012   Vitamin D deficiency 09/13/2008   Preop examination 04/20/2008   Hypothyroidism 05/20/2007    PCP: Wenda Low, MD  REFERRING PROVIDER: Megan Salon, MD  REFERRING DIAG: R15.9 (ICD-10-CM) - Incontinence of feces, unspecified fecal incontinence type  THERAPY DIAG:  No diagnosis found.  Rationale for Evaluation and Treatment Habilitation  ONSET DATE: years  SUBJECTIVE:                                                                                                                                                                                           SUBJECTIVE STATEMENT: For years has had chronic diarrhea but the past year or two has had chronic constipation and now it varies with constipation and diarrhea. Pt does have incontinence with  this recently, does think this is related to gluten intake, since she has altered gluten intake has seen improvement.   Fluid intake: Yes:  50 oz of water      PAIN:  Are you having pain? No   PRECAUTIONS: Rt lymphedema risk   WEIGHT BEARING RESTRICTIONS No   FALLS:  Has patient fallen in last 6 months? No   LIVING ENVIRONMENT: Lives with: lives alone Lives in: House/apartment   OCCUPATION: works full time    PATIENT GOALS to have more regular bowel movements  PERTINENT HISTORY:  H/O total hysterectomy, High risk HPV infection, Breast cancer ([place]), Status post total replacement of right hip, DVT, Postmastectomy lymphedema Sexual abuse: No  BOWEL MOVEMENT Pain with bowel movement: No Type of bowel movement:Type (Bristol Stool Scale) 1-2, Frequency not regular, and Strain Yes Fully empty rectum: Yes: sometimes Leakage: Yes:   Pads: No Fiber supplement: Yes: reports "I am on medication but do not take a supplement".   URINATION Pain with urination: No Fully empty bladder: Yes:   Stream: Strong Urgency: No Frequency: not quicker than every 2 hours Leakage:  no Pads: No    OBJECTIVE:   DIAGNOSTIC FINDINGS:   COGNITION:  Overall cognitive status: Within functional limits for tasks assessed     SENSATION:  Light touch: Appears intact  Proprioception: Appears intact  MUSCLE LENGTH: Bil hamstrings and adductors limited by 25%               POSTURE: posterior pelvic tilt    LUMBARAROM/PROM  A/PROM A/PROM  eval  Flexion Limited by 25%  Extension WFL  Right lateral flexion Limited by 25%  Left lateral flexion Limited by 25%  Right rotation WFL  Left rotation WFL   (Blank rows = not tested)  LOWER EXTREMITY ROM:  WFL  LOWER EXTREMITY MMT:  WFL   PALPATION:   General  no TTP but fascial restrictions noted in all quadrants of abdomen                External Perineal Exam deferred                              Internal Pelvic Floor deferred    Patient confirms identification and approves PT to assess internal pelvic floor and treatment Yes  PELVIC MMT:   MMT eval  Vaginal   Internal Anal Sphincter   External Anal Sphincter   Puborectalis   Diastasis Recti   (Blank rows = not tested)        TONE: deferred  PROLAPSE: Deferred   TODAY'S TREATMENT  EVAL Examination completed, findings reviewed, pt educated on POC, types of fiber, abdominal massage, voiding and breathing mechanics. Pt motivated to participate in PT and agreeable to attempt recommendations.     PATIENT EDUCATION:  Education details: POC, types of fiber, abdominal massage, voiding and breathing mechanics. Person educated: Patient Education method: Explanation, Demonstration, Tactile cues, Verbal cues, and Handouts Education comprehension: verbalized understanding and returned demonstration   HOME EXERCISE PROGRAM: POC, types of fiber, abdominal massage, voiding and breathing mechanics.  ASSESSMENT:  CLINICAL IMPRESSION: Patient is a 60 y.o. female  who was seen today for physical therapy evaluation and treatment for fecal incontinence and constipation. Pt reports she has struggled with constipation and diarrhea variations for years, has had incontinence with liquid and formed stools but since decreasing gluten this has improved a lot. Pt reports she is mostly struggling with constipation now with type 1-2 stools, frequent straining. Pt deferred internal rectal assessment this date and would like to try something else at home first,  if no progress then can attempt internal if needed. Pt found to have mild fascial restrictions in abdomen. Pt educated on POC, types of fiber, abdominal massage, voiding and breathing mechanics. Pt denied additional questions, demonstrated abdominal massage on pt for improved carry over for home. Pt would benefit from additional PT to further address deficits.     OBJECTIVE IMPAIRMENTS decreased coordination, decreased  endurance, decreased strength, increased fascial restrictions, improper body mechanics, and postural dysfunction.   ACTIVITY LIMITATIONS continence  PARTICIPATION LIMITATIONS: community activity  PERSONAL FACTORS Fitness, Past/current experiences, Time since onset of injury/illness/exacerbation, and 1 comorbidity: medical history  are also affecting patient's functional outcome.   REHAB POTENTIAL: Good  CLINICAL DECISION MAKING: Stable/uncomplicated  EVALUATION COMPLEXITY: Low   GOALS: Goals reviewed with patient? Yes  SHORT TERM GOALS: Target date: 09/29/2021  Pt to be I with HEP.  Baseline: Goal status: INITIAL  2.  Pt will report her BMs are complete due to improved bowel habits and evacuation techniques.  Baseline:  Goal status: INITIAL  3.  Pt will report 3 BMs per week due to improved muscle tone and coordination with BMs.  Baseline:  Goal status: INITIAL   LONG TERM GOALS: Target date:  12/02/21    Pt to be I with advanced HEP.  Baseline:  Goal status: INITIAL  2.  Pt will report 3-5 BMs per week due to improved muscle tone and coordination with BMs.  Baseline:  Goal status: INITIAL  3.  Pt to demonstrate improved strength at pelvic floor to at least 3/5 for decreased fecal incontinence.  Baseline:  Goal status: INITIAL   PLAN: PT FREQUENCY: every other week  PT DURATION:  5 sessions  PLANNED INTERVENTIONS: Therapeutic exercises, Therapeutic activity, Neuromuscular re-education, Patient/Family education, Self Care, Joint mobilization, Dry Needling, Spinal mobilization, Cryotherapy, Moist heat, scar mobilization, Taping, Biofeedback, and Manual therapy  PLAN FOR NEXT SESSION: internal if needed and pt consents, abdominal massage, breathing mechanics vs. Stretching hips/back/core  Stacy Gardner, PT, DPT 08/09/239:35 AM

## 2021-09-10 NOTE — Therapy (Addendum)
OUTPATIENT PHYSICAL THERAPY TREATMENT NOTE   Patient Name: Sierra Perez MRN: [id number] DOB:Dec 19, 1960, 61 y.o., female Today's Date: 09/10/2021  PCP: Wenda Low, MD REFERRING PROVIDER: Nicholas Lose, MD  END OF SESSION:   PT End of Session - 09/10/21 0845     Visit Number 4    Number of Visits 4    Date for PT Re-Evaluation 12/02/21    Authorization Type [employer]    Authorization - Visit Number 3    Authorization - Number of Visits 23    PT Start Time 0845    PT Stop Time 0930    PT Time Calculation (min) 45 min    Activity Tolerance Patient tolerated treatment well    Behavior During Therapy WFL for tasks assessed/performed             Past Medical History:  Diagnosis Date   Anemia    Anxiety    Asthma    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, her-2 positive, ER/PR negative   Clotting disorder ([place])    testing was negative, h/o DVT while on treatment   Complex regional pain syndrome i of right lower limb    RDS   Diabetes mellitus without complication ([place])    DVT (deep venous thrombosis) ([place])    started in 2015, multiple   Family history of breast cancer    Family history of colon cancer    Fibroid    GERD (gastroesophageal reflux disease)    Heart murmur    HPV in female    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   Hypertension    Hypothyroidism    IBS (irritable bowel syndrome)    Mitral valve prolapse    Personal history of malignant neoplasm of breast    Thyroid disease    Past Surgical History:  Procedure Laterality Date   ABDOMINAL HYSTERECTOMY  2010   fibroids, h/o abnormal pap smears   AXILLARY SENTINEL NODE BIOPSY Right 07/08/2020   Procedure: RIGHT AXILLARY SENTINEL NODE BIOPSY;  Surgeon: Rolm Bookbinder, MD;  Location: [facility];  Service: General;  Laterality: Right;   BREAST RECONSTRUCTION WITH PLACEMENT OF TISSUE EXPANDER AND FLEX HD (ACELLULAR HYDRATED DERMIS) Bilateral 07/08/2020   Procedure: BILATERAL BREAST  RECONSTRUCTION WITH  FLEX HD (ACELLULAR HYDRATED DERMIS),DIRECT IMPLANT RECONSTRUCTION;  Surgeon: Cindra Presume, MD;  Location: [facility];  Service: Plastics;  Laterality: Bilateral;   BREAST SURGERY Right 2015   right lumpectomy with reduction and lift   COLONOSCOPY     DENTAL SURGERY     ESOPHAGOGASTRODUODENOSCOPY     FOOT SURGERY     dislocated toe   LAPAROSCOPIC GASTRIC SLEEVE RESECTION  2014   LAPAROSCOPIC OOPHERECTOMY Bilateral 2016   REPLACEMENT TOTAL KNEE Left 2018   also had 3 prior arthroscopies   TOTAL HIP ARTHROPLASTY Right 11/19/2020   Procedure: RIGHT TOTAL HIP ARTHROPLASTY ANTERIOR APPROACH;  Surgeon: Mcarthur Rossetti, MD;  Location: [facility];  Service: Orthopedics;  Laterality: Right;   TOTAL MASTECTOMY Bilateral 07/08/2020   Procedure: BILATERAL TOTAL MASTECTOMY;  Surgeon: Rolm Bookbinder, MD;  Location: [facility];  Service: General;  Laterality: Bilateral;   VENA CAVA FILTER PLACEMENT  2017   Patient Active Problem List   Diagnosis Date Noted   Obesity (BMI 30-39.9) 06/23/2021   Iron deficiency anemia 01/23/2021   Status post right hip replacement 12/03/2020   Status post total replacement of right hip 11/19/2020   Breast cancer ([place]) 07/08/2020  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   H/O total hysterectomy 06/11/2020   Genetic testing 05/27/2020   Chronic superficial gastritis 05/27/2020   Fibromyalgia 05/27/2020   GERD (gastroesophageal reflux disease) 05/27/2020   Prediabetes 05/27/2020   Complex regional pain syndrome I, unspecified 05/27/2020   Atypical lobular hyperplasia [facility]) of left breast 05/23/2020   History of therapeutic radiation 05/23/2020   Family history of breast cancer    Family history of colon cancer    Personal history of malignant neoplasm of breast    Unilateral primary osteoarthritis, right knee 02/26/2020   Unilateral primary osteoarthritis, 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   DVT (deep venous thrombosis) ([place])  05/04/2018   Malignant neoplasm of upper-outer quadrant of right breast in female, estrogen receptor negative ([place]) 05/03/2018   Obstructive sleep apnea (adult) (pediatric) 09/14/2017   Adenopathy 07/15/2017   B12 deficiency 07/15/2017   Disorder of tympanic membrane of right ear 07/15/2017   Recurrent UTI 03/09/2017   Bladder pain 02/25/2017   Proteinuria 02/25/2017   Hematoma of thigh, left, subsequent encounter 08/16/2016   At risk for obstructive sleep apnea 07/01/2016   Suspected sleep apnea 07/01/2016   Arthritis of left knee 05/26/2016   Localized primary osteoarthritis of lower leg, left 05/26/2016   Clotting disorder ([place]) 05/22/2016   Chronic anticoagulation 01/06/2016   Right-sided epistaxis 01/06/2016   Urinary tract infection, site not specified 08/12/2015   Abnormal Pap smear of vagina 05/06/2015   Follow-up examination, following other surgery 12/14/2013   Pelvic and perineal pain 11/28/2013   Anxiety and depression 11/16/2013   H/O bariatric surgery 11/16/2013   Atrophic vaginitis 11/13/2013   Tear of lateral cartilage or meniscus of knee, current 04/14/2013   Allergic rhinitis 01/16/2013   Female genital symptoms 11/10/2012   Symptomatic menopausal or female climacteric states 11/10/2012   Disequilibrium 06/09/2012   Morbid obesity ([place]) 02/09/2012   Vitamin D deficiency 09/13/2008   Preop examination 04/20/2008   Hypothyroidism 05/20/2007    REFERRING DIAG:  C50.411,Z17.1 (ICD-10-CM) - Malignant neoplasm of upper-outer quadrant of right breast in female, estrogen receptor negative ([place], lymphedema  THERAPY DIAG:  Muscle weakness (generalized)  Unspecified lack of coordination  Abnormal posture  Aftercare following surgery for neoplasm  Rationale for Evaluation and Treatment Rehabilitation  PERTINENT HISTORY: 2014 Rt breast cancer with lumpectomy with SLNB, radiation, and chemotherapy. Recurrence on the Rt with bil mastectomy with SLNB on 07/08/20 due to  grade 2 ILC ER/PR positive.  Other history includes clotting disorder and DVT history. Had a SOZO score increased of 14.3 on 03/13/21 which decreased back to normal in 04/10/21  PRECAUTIONS: Rt lymphedema risk  SUBJECTIVE: I joined [place] and I even went this AM to try out some machines.   PAIN:  Are you having pain? No, not right now but I typically have a low level of ache somewhere.    OBJECTIVE: (objective measures completed at initial evaluation unless otherwise dated)  OBJECTIVE COGNITION:            Overall cognitive status: Within functional limits for tasks assessed    PALPATION: Pt has some increased soft nodular tissue in the axilla but this is bilaterally.  Rt lateral breast has enlarged pores and divot from surgery which pt reports has not changed since post op   OBSERVATIONS / OTHER ASSESSMENTS: extra skin lateral trunk bil   POSTURE: WNL   UPPER EXTREMITY AROM/PROM:  Rt                                 Lt FLEXION                                             155                              155 ABDUCTION                                       160                              160 ER                                                       Full                              full IR                                                         Full                              Full Horizontal abduction                           Full                              Full     TODAY'S TREATMENT   09/10/21:Pt arrives for aquatic physical therapy. Treatment took place in 3.5-5.5 feet of water. Water temperature was 91 degrees F. Pt entered the pool via stairs with mild use of hand rail.  Review beginner level exercises taught in her first session. Pt performed all correctly. Discussed ways in which she progress them over a period of time. Pt verbally understood concepts.   08/15/21: Pt arrives for aquatic physical therapy. Treatment took  place in 3.5-5.5 feet of water. Water temperature was 92 degrees F. Pt entered the pool via . Pt was educated in water principles and how we would be using them and how she can progress them in the future. Pt verbally understood all concepts and returned demo. Seated water bench with 75% submersion Pt performed seated LE AROM exercises 20x in all planes, 75% water walking 6x in each direction with natural arm movements. Seated decompression with large noodle behind patient. Standing against the wall in a mini squat to get most of her shoulders submerged: shoulder flex/ext 10x, shoulder abd/add under the water  10x    Performed LDEX to compare to baseline: 6.2 which is still WNL - no signs of lymphedema - moved SOZO to 3 months out Reviewed HEP with performance of each per HEP below Discussed risk reduction and how to watch for lymphedema as well as use of sleeve with exercise, review of how to return to exercise post breast cancer Discussed aquatic therapy after pt mentioned difficulty exercising due to bil knee pain and overall pain - added to POC and mentioned benefits post breast cancer as well   PATIENT EDUCATION:  Education details: per above Person educated: Patient Education method: Consulting civil engineer, Demonstration, Verbal cues, and Handouts Education comprehension: verbalized understanding and returned demonstration   HOME EXERCISE PROGRAM: Access Code: [email or web address] URL: [email or web address] Date: 07/18/2021 Prepared by: Shan Levans  Exercises - Doorway Pec Stretch at 90 Degrees Abduction - 2 x daily - 7 x weekly - 1 sets - 3 reps - 20-30 seconds hold - Mermaid - 2 x daily - 7 x weekly - 1 sets - 5 reps - 5 second hold - Standing Shoulder Flexion Stretch on Wall - 2 x daily - 7 x weekly - 1 sets - 5 reps - 20-30 seconds hold   ASSESSMENT: CLINICAL IMPRESSION: Pt is independent in her beginner level aquatic exercises. She has successfully performed some sessions on her own with  no issues. All goals are currently met. Today we discussed ways to gently progress some of her beginner exercises with biggest emphasis on creating a habit or consistent exercise. Pt continues to do her initial HEP for shoulder stretching.   OBJECTIVE IMPAIRMENTS decreased knowledge of condition, difficulty walking, and pain.    ACTIVITY LIMITATIONS lifting and squatting   PARTICIPATION LIMITATIONS: none   PERSONAL FACTORS SLNB, radiation hx are also affecting patient's functional outcome.    REHAB POTENTIAL: Excellent   CLINICAL DECISION MAKING: Stable/uncomplicated   EVALUATION COMPLEXITY: Low   GOALS: Goals reviewed with patient? Yes   SHORT TERM GOALS: Target date: 07/18/21   Pt will be ind with HEP for chest stretching Baseline: Goal status: MET   LONG TERM GOALS: Target date: 10/10/2021     Pt will be ind with HEP for aquatic exercise to improve overall pain and stiffness, decrease lymphedema risk, and prepare for knee surgery.  Baseline:  Goal status: Goal met 09/10/21   PLAN: PT FREQUENCY: 1x/week   PT DURATION: 12 weeks   PLANNED INTERVENTIONS: Therapeutic exercises, Patient/Family education, and Aquatic Therapy   PLAN FOR NEXT SESSION:  Pt will continue with aquatics and gym exercises at [facility] every other day. Pt has also begun PT for pelvic floor.     Myrene Galas, PTA 09/10/21 11:34 AM            Sierra Perez, PTA 09/10/2021, 11:34 AM    PHYSICAL THERAPY DISCHARGE SUMMARY  Visits from Start of Care: 4  Current functional level related to goals / functional outcomes: DC from aquatic therapy - will continue LDEX screenings per protocol and leave episode open   Remaining deficits: At risk for lymphedema    Education / Equipment: Final HEP  Plan: Patient agrees to discharge. Patient is being discharged due to meeting the stated rehab goals.

## 2021-09-23 ENCOUNTER — Ambulatory Visit: Payer: 59 | Admitting: Pharmacist

## 2021-09-23 VITALS — BP 110/72 | HR 73 | Wt 205.5 lb

## 2021-09-23 DIAGNOSIS — E669 Obesity, unspecified: Secondary | ICD-10-CM | POA: Diagnosis not present

## 2021-09-23 DIAGNOSIS — E119 Type 2 diabetes mellitus without complications: Secondary | ICD-10-CM | POA: Diagnosis not present

## 2021-09-23 LAB — HEMOGLOBIN A1C
Est. average glucose Bld gHb Est-mCnc: 111 mg/dL
Hgb A1c MFr Bld: 5.5 % (ref 4.8–5.6)

## 2021-09-23 NOTE — Patient Instructions (Signed)
It was nice to see you today  We'll check your A1c today and can adjust the dose of your [name] if needed

## 2021-09-23 NOTE — Progress Notes (Unsigned)
Patient ID: Sierra Perez                 DOB: 1960-07-09                    MRN: [id number]     HPI: Sierra Perez is a 61 y.o. female patient referred to pharmacy clinic by Dr. Haroldine Laws for weight loss therapy with GLP1-RA. 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 Baseline BMI 35.37 kg/m2. Denies personal/family hx of thyroid cancer. Denies personal hx of pancreatitis. Insurance plan does not cover weight loss medication (prior authorization for Wegovy denied), but with DM diagnosis, PA for [name] was approved through 05/09/2022 with $25/month copay. She started on [facility] in May and presents today for 3 month follow up.  I spoke with pt a few weeks ago, at that time she had given 4 doses of Mounjaro 7.22m. Her PCP had previously stopped her metformin. Didn't have much of an appetite, some days wouldn't eat until 2-3pm which was unusual for her. Has more of a binge eating habit in the past so this was the first time she hasn't thought about food regularly. Continued 7.533mdose for another month, discussed keeping up her nutrition and trying to eat smaller more frequent portions throughout the day.   Pt presents today for follow up. Had constipation at baseline which is unchanged. Has vomited a few times ~1 day after dose increase but no nausea, just feels like the food needs to come up. Overall feels like she's tolerating the medicatoin well and has been pleased with weight loss she's seen so far. Thinks about food a lot less now, has noticed major improvement in thoughts of binge eating. Has lost 12% of her body weight in 3-4 months. PCP stopped metformin about 6 weeks ago, planning on rechecking A1c today to see if DM remains well controlled on just Mounjaro. Joined a gym and has been going a few times a week - 1/2 hour of cardio + weight machines.  Has been eating more veggies, stuffed peppers, eggs,  yogurt, bean burritos. Drinks primarily water, occasionally kombucha.  Current weight management medications: Mounjaro 7.62m50meekly  Previously tried meds: none  Current meds that may affect weight: metformin (-), gabapentin (+)  Baseline weight/BMI: 232 lbs, BMI 35.37 kg/m2  Insurance payor: [employer]  Family History: DM, Factor VII deficiency, kidney disease in father. HTN in her mother. Stroke in maternal grandmother.  Social History: Denies tobacco use. Alcohol intake <1 glass of wine/week. Previous marijuana use.  Labs: 04/30/20 Lipid panel: LDL 123, TG 128 Lab Results  Component Value Date   HGBA1C 7.0 (H) 11/15/2020    Wt Readings from Last 1 Encounters:  08/14/21 217 lb 6.4 oz (98.6 kg)    BP Readings from Last 1 Encounters:  08/14/21 130/78   Pulse Readings from Last 1 Encounters:  08/14/21 74    No results found for: "CHOL", "TRIG", "HDL", "CHOLHDL", "VLDL", "LDLCALC", "LDLDIRECT"  Past Medical History:  Diagnosis Date   Anemia    Anxiety    Asthma    [place] ([facility]   Invasive ductal, her-2 positive, ER/PR negative   Clotting disorder ([place]  testing was negative, h/o DVT while on treatment   Complex regional pain syndrome i of right lower limb    RDS   Diabetes mellitus without complication ([facility]  DVT (deep venous  thrombosis) ([place])    started in 2015, multiple   Family history of breast cancer    Family history of colon cancer    Fibroid    GERD (gastroesophageal reflux disease)    Heart murmur    HPV in female    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     amLODipine (NORVASC) 5 MG tablet Take 5 mg by mouth daily.     anastrozole (ARIMIDEX) 1 MG tablet Take 1 tablet (1 mg total) by mouth daily. 90 tablet 3   CALCIUM CITRATE PO Take 2 tablets by mouth daily. Zinc  Copper Vit d  Manganese Sodium     Cholecalciferol (VITAMIN D-3) 25 MCG (1000 UT) CAPS Take 1,000 Units by mouth daily.     DULoxetine (CYMBALTA) 30 MG capsule 30 mg daily.     DULoxetine (CYMBALTA) 60 MG capsule Take 60 mg by mouth daily.     ELIQUIS 5 MG TABS tablet TAKE 1 TABLET(5 MG) BY MOUTH TWICE DAILY 180 tablet 2   fluticasone (FLONASE) 50 MCG/ACT nasal spray Place 2 sprays into both nostrils daily.     gabapentin (NEURONTIN) 600 MG tablet Take 600 mg by mouth 2 (two) times daily.     ipratropium (ATROVENT) 0.03 % nasal spray Place 2 sprays into both nostrils 2 (two) times daily.     Lactobacillus Rhamnosus, GG, (CULTURELLE PO) Take 1 capsule by mouth daily.     levothyroxine (SYNTHROID) 50 MCG tablet Take 50 mcg by mouth every morning.     pantoprazole (PROTONIX) 40 MG tablet Take 1 tablet (40 mg total) by mouth daily.     rosuvastatin (CRESTOR) 5 MG tablet Take 1 tablet (5 mg total) by mouth daily. 90 tablet 3   sennosides-docusate sodium (SENOKOT-S) 8.6-50 MG tablet Take 2 tablets by mouth daily.     tirzepatide (MOUNJARO) 7.5 MG/0.5ML Pen Inject 7.5 mg into the skin once a week. 2 mL 0   valACYclovir (VALTREX) 500 MG tablet Take 1 tablet (500 mg total) by mouth daily. Increase to bid x 3 days with symptoms 120 tablet 4   No current facility-administered medications on file prior to visit.    Allergies  Allergen Reactions   Succinylcholine Other (See Comments)    Muscle aching  18 years ago from 07/02/20   Azithromycin     **DOES NOT WORK** **DOES NOT WORK**    Keflex [Cephalexin] Diarrhea and Nausea And Vomiting   Silver Rash   Sulfa Antibiotics Rash    And swelling generalized not in throat    Sulfasalazine Rash     Assessment/Plan:  1. Weight loss/DM - Pt has lost 12% of her weight so far on 3-4 months of Mounjaro, currently at 7.$RemoveBefore'5mg'mlvLbhmybBdcy$  weekly dosing. Rechecking A1c today as pt stopped her metformin about 6 weeks ago. If DM is well controlled, pt prefers to continue Mounjaro at 7.$RemoveBefor'5mg'xZZXRymqwCQY$  dose since she's been tolerating it well. If additional glucose control is needed, will plan to continue with dose titration  of Mounjaro. Has an upcoming colonoscopy - discussed that MD may want her to skip 1 dose of Mounjaro prior to procedure. 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   Julann Mcgilvray E. Tagen Milby, PharmD, BCACP, [place] [address] [place], [place] [address] Phone: [phone]; Fax: [phone] 09/23/2021 7:18 AM

## 2021-09-24 ENCOUNTER — Other Ambulatory Visit: Payer: Self-pay | Admitting: Pharmacist

## 2021-09-24 MED ORDER — MOUNJARO 7.5 MG/0.5ML ~~LOC~~ SOAJ: 7.5000 mg | SUBCUTANEOUS | 11 refills | Status: DC

## 2021-09-25 ENCOUNTER — Encounter: Payer: Self-pay | Admitting: Gastroenterology

## 2021-09-25 ENCOUNTER — Ambulatory Visit: Payer: 59 | Admitting: Gastroenterology

## 2021-09-25 VITALS — BP 110/80 | HR 76 | Ht 67.5 in | Wt 207.0 lb

## 2021-09-25 DIAGNOSIS — K209 Esophagitis, unspecified without bleeding: Secondary | ICD-10-CM

## 2021-09-25 DIAGNOSIS — R194 Change in bowel habit: Secondary | ICD-10-CM

## 2021-09-25 DIAGNOSIS — K317 Polyp of stomach and duodenum: Secondary | ICD-10-CM

## 2021-09-25 MED ORDER — NA SULFATE-K SULFATE-MG SULF 17.5-3.13-1.6 GM/177ML PO SOLN
1.0000 | Freq: Once | ORAL | 0 refills | Status: AC
Start: 2021-09-25 — End: 2021-09-25

## 2021-09-25 NOTE — Patient Instructions (Addendum)
It was my pleasure to provide care to you today. Based on our discussion, I am providing you with my recommendations below:  RECOMMENDATION(S):   COLONOSCOPY/UPPER ENDOSCOPY:   You have been scheduled for a colonoscopy/upper endoscopy. Please follow written instructions given to you at your visit today.   PREP:   Please pick up your prep supplies at the pharmacy within the next 1-3 days.  INHALERS:   If you use inhalers (even only as needed), please bring them with you on the day of your procedure.  COLONOSCOPY TIPS:  To reduce nausea and dehydration, stay well hydrated for 3-4 days prior to the exam.  To prevent skin/hemorrhoid irritation - prior to wiping, put A&Dointment or vaseline on the toilet paper. Keep a towel or pad on the bed.  BEFORE STARTING YOUR PREP, drink  64oz of clear liquids in the morning. This will help to flush the colon and will ensure you are well hydrated!!!!  NOTE - This is in addition to the fluids required for to complete your prep. Use of a flavored hard candy, such as grape Anise Salvo, can counteract some of the flavor of the prep and may prevent some nausea.    FOLLOW UP:  After your procedure, you will receive a call from my office staff regarding my recommendation for follow up.  BMI:  If you are age 61 or older, your body mass index should be between 23-30. Your Body mass index is 31.94 kg/m. If this is out of the aforementioned range listed, please consider follow up with your Primary Care Provider.  If you are age 45 or younger, your body mass index should be between 19-25. Your Body mass index is 31.94 kg/m. If this is out of the aformentioned range listed, please consider follow up with your Primary Care Provider.   MY CHART:  The [facility] providers would like to encourage you to use [facility] to communicate with providers for non-urgent requests or questions.  Due to long hold times on the telephone, sending your provider a message by  [facility] may be a faster and more efficient way to get a response.  Please allow 48 business hours for a response.  Please remember that this is for non-urgent requests.   Thank you for trusting me with your gastrointestinal care!    [place], MD, MPH

## 2021-09-25 NOTE — Progress Notes (Signed)
Referring Provider: Wenda Low, MD Primary Care Physician:  Wenda Low, MD   Reason for Consultation: Reflux, pelvic floor, endoscopic evaluation   IMPRESSION:  History of gastric polyps. Prior pathology results are not available. EGD reports from 2017 notes no gastric polyps.   Reflux with history of LA Class C reflux esophagitis on EGD 2017. Follow-up EGD recommended by prior GI but not yet performed. Reflux controlled on daily PPI therapy with ongoing concerns about chronic risk of PPI use  Altered bowel habits with concerns for pelvic floor dysfunction. Prior diagnosis of IBS-C. Prior celiac serologies negative. Duodenal biopsies negative in 2017 (although she was on a strict gluten free diet at the time).  Must evaluate for IBS mimickers - including IBD, food intolerance, SIBO.  Incomplete colonoscopy due to poor prep 2022  Maternal grandfather with colon cancer in his 21s.   Mom and brother with colon polyps.   On Mounjaro  PLAN: EGD to follow-up on gastric polyps and LA Class C reflux esophagitis'  Surveillance colonoscopy with 2 day bowel prep     HPI: Sierra Perez is a 61 y.o. female who referred by Dr. Sabra Heck.  The history is obtained through the patient and review of her electronic health record including [place]. She has a history of anemia, anxiety, asthma, breast cancer, diabetes on mounjaro, DVT s/p ICV filter, hyperlipidemia, hypertension, hypothyroidism, mitral valve prolapse, reflex sympathetic dystrophy. She had a gastric sleeve in 2014. She has been on a stable dose of Synthroid. She is an Forensic psychologist.   History of gastric polyps incidentally found on her first EGD.  On Nexium at the time. She had not have gastric polyps since she stopped Nexium. She is now on pantoprazole. If she misses a dose she will wake up with severe heartburn. But, she is concerned about the long term side effects.  Chronic diarrhea. Although a few years ago her diarrhea  switched to constipation. Stools are hard, firm pellets with associated straining. Occasional blood when wiping. Will have some accidents with both diarrhea and constipation.Previously diagnosed as IBS. She had biopsies for celiac at the time of endoscopy that were negative, but, she was not on a diet with gluten at that time. She is concerned that the diet resulted in false negative results.   Prior gastroenterologist treated her with Linzess 290 mcg daily, Miralax BID, Senekot, Colace, daily probiotic, Fibercon, and low FODMAP diet.  She drinks at least 64 ounces of water daily.   She is now on mounjaro.   She had one session of pelvic floor PT through [facility].  EGD 2017 showed LA grade C reflux esophagitis, no gastric polyps  Colonoscopy 2017 with surveillance recommended in 7 years.  Normal celiac panel 2019  Colonoscopy with Dr. Deno Etienne for a personal history of colon polyps 05/09/2020 showed internal hemorrhoids and it poor prep.  Repeat colonoscopy recommended in 3 years.  Colonoscopy prior to that time showed no polyps. She understood that she needed to have a colonoscopy every 5 years.   She was also told that she has a tortuous colon.   Maternal grandfather with colon cancer in his 108s. Mom and brother with colon polyps. There is no other known family history of colon cancer or polyps. No family history of stomach cancer or other GI malignancy. No family history of inflammatory bowel disease or celiac.   Normal iron and ferritin and CBC 08/14/21 Normal CMP except for glucose 129 03/03/21   Past Medical History:  Diagnosis Date  Anemia    Anxiety    Arthritis    Asthma    Breast cancer ([place]) 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, her-2 positive, ER/PR negative   Clotting disorder ([place])    testing was negative, h/o DVT while on treatment   Colon polyps    Complex regional pain syndrome i of right lower limb    RDS   Cystitis    Diabetes mellitus without complication ([place])    DVT (deep  venous thrombosis) ([place])    started in 2015, multiple   Family history of breast cancer    Family history of colon cancer    Fibroid    Fibromyalgia    GERD (gastroesophageal reflux disease)    Heart murmur    HLD (hyperlipidemia)    HPV in female    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   Hypertension    Hypothyroidism    IBS (irritable bowel syndrome)    Mitral valve prolapse    Peptic ulcer    Personal history of malignant neoplasm of breast    Sleep apnea treated with continuous positive airway pressure (CPAP)     Past Surgical History:  Procedure Laterality Date   ABDOMINAL HYSTERECTOMY  2010   fibroids, h/o abnormal pap smears   AXILLARY SENTINEL NODE BIOPSY Right 07/08/2020   Procedure: RIGHT AXILLARY SENTINEL NODE BIOPSY;  Surgeon: Rolm Bookbinder, MD;  Location: [facility];  Service: General;  Laterality: Right;   BREAST RECONSTRUCTION WITH PLACEMENT OF TISSUE EXPANDER AND FLEX HD (ACELLULAR HYDRATED DERMIS) Bilateral 07/08/2020   Procedure: BILATERAL BREAST RECONSTRUCTION WITH  FLEX HD (ACELLULAR HYDRATED DERMIS),DIRECT IMPLANT RECONSTRUCTION;  Surgeon: Cindra Presume, MD;  Location: [facility];  Service: Plastics;  Laterality: Bilateral;   BREAST SURGERY Right 2015   right lumpectomy with reduction and lift   COLONOSCOPY     DENTAL SURGERY     ESOPHAGOGASTRODUODENOSCOPY     LAPAROSCOPIC GASTRIC SLEEVE RESECTION  2014   LAPAROSCOPIC OOPHERECTOMY Bilateral 2016   REPLACEMENT TOTAL KNEE Left 2018   also had 3 prior arthroscopies   TOE SURGERY Right    dislocated toe   TOTAL HIP ARTHROPLASTY Right 11/19/2020   Procedure: RIGHT TOTAL HIP ARTHROPLASTY ANTERIOR APPROACH;  Surgeon: Mcarthur Rossetti, MD;  Location: [facility];  Service: Orthopedics;  Laterality: Right;   TOTAL MASTECTOMY Bilateral 07/08/2020   Procedure: BILATERAL TOTAL MASTECTOMY;  Surgeon: Rolm Bookbinder, MD;  Location: [place];  Service: General;  Laterality: Bilateral;   VENA CAVA  FILTER PLACEMENT  2017     Current Outpatient Medications  Medication Sig Dispense Refill   albuterol (VENTOLIN HFA) 108 (90 Base) MCG/ACT inhaler Inhale 1 puff into the lungs every 6 (six) hours as needed for wheezing or shortness of breath.     amLODipine (NORVASC) 5 MG tablet Take 5 mg by mouth daily.     anastrozole (ARIMIDEX) 1 MG tablet Take 1 tablet (1 mg total) by mouth daily. 90 tablet 3   CALCIUM CITRATE PO Take 2 tablets by mouth daily. Zinc  Copper Vit d  Manganese Sodium     Cholecalciferol (VITAMIN D-3) 25 MCG (1000 UT) CAPS Take 1,000 Units by mouth daily.     DULoxetine (CYMBALTA) 30 MG capsule 30 mg daily.     DULoxetine (CYMBALTA) 60 MG capsule Take 60 mg by mouth daily.     ELIQUIS 5 MG TABS tablet TAKE 1 TABLET(5 MG) BY MOUTH TWICE DAILY 180 tablet 2   fluticasone (  FLONASE) 50 MCG/ACT nasal spray Place 2 sprays into both nostrils daily.     gabapentin (NEURONTIN) 600 MG tablet Take 600 mg by mouth 2 (two) times daily.     ipratropium (ATROVENT) 0.03 % nasal spray Place 2 sprays into both nostrils 2 (two) times daily.     Lactobacillus Rhamnosus, GG, (CULTURELLE PO) Take 1 capsule by mouth daily.     levothyroxine (SYNTHROID) 50 MCG tablet Take 50 mcg by mouth every morning.     pantoprazole (PROTONIX) 40 MG tablet Take 1 tablet (40 mg total) by mouth daily.     rosuvastatin (CRESTOR) 5 MG tablet Take 1 tablet (5 mg total) by mouth daily. 90 tablet 3   tirzepatide (MOUNJARO) 7.5 MG/0.5ML Pen Inject 7.5 mg into the skin once a week. 2 mL 11   valACYclovir (VALTREX) 500 MG tablet Take 1 tablet (500 mg total) by mouth daily. Increase to bid x 3 days with symptoms 120 tablet 4   No current facility-administered medications for this visit.    Allergies as of 09/25/2021 - Review Complete 09/25/2021  Allergen Reaction Noted   Succinylcholine Other (See Comments) 01/19/2018   Azithromycin  09/29/2013   Keflex [cephalexin] Diarrhea and Nausea And Vomiting 07/25/2020    Silver Rash 01/24/2013   Sulfa antibiotics Rash 01/19/2018   Sulfasalazine Rash 06/09/2012    Family History  Problem Relation Age of Onset   Hypertension Mother    Skin cancer Mother    Colon polyps Mother    Valvular heart disease Mother    Kidney disease Mother    Diabetes Father    Kidney disease Father    Dementia Father    Factor VIII deficiency Father    Colon polyps Brother    Bladder Cancer Maternal Grandmother        dx in late 102s; smoker   Stroke Maternal Grandmother    Diabetes Maternal Grandmother    Lung cancer Maternal Grandfather    Colon cancer Maternal Grandfather 66       origin   Thyroid cancer Maternal Grandfather        dx in his 74s   Breast cancer Paternal Grandmother        dx in 32s   Prostate cancer Other        MGMs brother    Social History   Socioeconomic History   Marital status: Single    Spouse name: Not on file   Number of children: 1   Years of education: Not on file   Highest education level: Not on file  Occupational History   Occupation: Attroney  Tobacco Use   Smoking status: Never   Smokeless tobacco: Never  Vaping Use   Vaping Use: Never used  Substance and Sexual Activity   Alcohol use: Yes    Comment: occ   Drug use: Not Currently    Types: Marijuana   Sexual activity: Not Currently    Birth control/protection: Surgical    Comment: Hysterectomy  Other Topics Concern   Not on file  Social History Narrative   Not on file   Social Determinants of Health   Financial Resource Strain: Not on file  Food Insecurity: Not on file  Transportation Needs: Not on file  Physical Activity: Not on file  Stress: Not on file  Social Connections: Not on file  Intimate Partner Violence: Not on file    Review of Systems: 12 system ROS is negative except as noted above.   Physical Exam:  General:   Alert,  well-nourished, pleasant and cooperative in NAD Head:  Normocephalic and atraumatic. Eyes:  Sclera clear, no  icterus.   Conjunctiva pink. Ears:  Normal auditory acuity. Nose:  No deformity, discharge,  or lesions. Mouth:  No deformity or lesions.   Neck:  Supple; no masses or thyromegaly. Lungs:  Clear throughout to auscultation.   No wheezes. Heart:  Regular rate and rhythm; no murmurs. Abdomen:  Soft, nontender, nondistended, normal bowel sounds, no rebound or guarding. No hepatosplenomegaly.   Rectal:  Deferred  Msk:  Symmetrical. No boney deformities LAD: No inguinal or umbilical LAD Extremities:  No clubbing or edema. Neurologic:  Alert and  oriented x4;  grossly nonfocal Skin:  Intact without significant lesions or rashes. Psych:  Alert and cooperative. Normal mood and affect.     Rossy Virag L. Tarri Glenn, MD, MPH 09/25/2021, 11:51 AM

## 2021-10-01 ENCOUNTER — Ambulatory Visit: Payer: 59 | Admitting: Physical Therapy

## 2021-10-02 ENCOUNTER — Ambulatory Visit (AMBULATORY_SURGERY_CENTER): Payer: 59 | Admitting: Gastroenterology

## 2021-10-02 ENCOUNTER — Encounter: Payer: Self-pay | Admitting: Gastroenterology

## 2021-10-02 VITALS — BP 159/71 | HR 65 | Temp 98.4°F | Resp 17 | Ht 67.0 in | Wt 207.0 lb

## 2021-10-02 DIAGNOSIS — K317 Polyp of stomach and duodenum: Secondary | ICD-10-CM | POA: Diagnosis not present

## 2021-10-02 DIAGNOSIS — K219 Gastro-esophageal reflux disease without esophagitis: Secondary | ICD-10-CM | POA: Diagnosis not present

## 2021-10-02 DIAGNOSIS — Z800 Family history of malignant neoplasm of digestive organs: Secondary | ICD-10-CM | POA: Diagnosis not present

## 2021-10-02 DIAGNOSIS — Z1211 Encounter for screening for malignant neoplasm of colon: Secondary | ICD-10-CM | POA: Diagnosis not present

## 2021-10-02 DIAGNOSIS — D120 Benign neoplasm of cecum: Secondary | ICD-10-CM

## 2021-10-02 DIAGNOSIS — K2100 Gastro-esophageal reflux disease with esophagitis, without bleeding: Secondary | ICD-10-CM | POA: Diagnosis not present

## 2021-10-02 DIAGNOSIS — Z8371 Family history of colonic polyps: Secondary | ICD-10-CM | POA: Diagnosis not present

## 2021-10-02 DIAGNOSIS — K449 Diaphragmatic hernia without obstruction or gangrene: Secondary | ICD-10-CM

## 2021-10-02 MED ORDER — SODIUM CHLORIDE 0.9 % IV SOLN: 500.0000 mL | Freq: Once | INTRAVENOUS | Status: DC

## 2021-10-02 NOTE — Progress Notes (Signed)
To pacu, VSS. Report to [email or web address] 

## 2021-10-02 NOTE — Progress Notes (Signed)
Called to room to assist during endoscopic procedure.  Patient ID and intended procedure confirmed with present staff. Received instructions for my participation in the procedure from the performing physician.  

## 2021-10-02 NOTE — Patient Instructions (Signed)
Thank you for letting us take care of your healthcare needs today. PLease see handouts given to you on Polyps, GERD, Hiatal Hernia, and Hemorrhoids. You may resume your Eliquis tomorrow.    YOU HAD AN ENDOSCOPIC PROCEDURE TODAY AT [facility]:   Refer to the procedure report that was given to you for any specific questions about what was found during the examination.  If the procedure report does not answer your questions, please call your gastroenterologist to clarify.  If you requested that your care partner not be given the details of your procedure findings, then the procedure report has been included in a sealed envelope for you to review at your convenience later.  YOU SHOULD EXPECT: Some feelings of bloating in the abdomen. Passage of more gas than usual.  Walking can help get rid of the air that was put into your GI tract during the procedure and reduce the bloating. If you had a lower endoscopy (such as a colonoscopy or flexible sigmoidoscopy) you may notice spotting of blood in your stool or on the toilet paper. If you underwent a bowel prep for your procedure, you may not have a normal bowel movement for a few days.  Please Note:  You might notice some irritation and congestion in your nose or some drainage.  This is from the oxygen used during your procedure.  There is no need for concern and it should clear up in a day or so.  SYMPTOMS TO REPORT IMMEDIATELY:  Following lower endoscopy (colonoscopy or flexible sigmoidoscopy):  Excessive amounts of blood in the stool  Significant tenderness or worsening of abdominal pains  Swelling of the abdomen that is new, acute  Fever of 100F or higher  Following upper endoscopy (EGD)  Vomiting of blood or coffee ground material  New chest pain or pain under the shoulder blades  Painful or persistently difficult swallowing  New shortness of breath  Fever of 100F or higher  Black, tarry-looking stools  For urgent or  emergent issues, a gastroenterologist can be reached at any hour by calling [phone]. Do not use MyChart messaging for urgent concerns.    DIET:  We do recommend a small meal at first, but then you may proceed to your regular diet.  Drink plenty of fluids but you should avoid alcoholic beverages for 24 hours.  ACTIVITY:  You should plan to take it easy for the rest of today and you should NOT DRIVE or use heavy machinery until tomorrow (because of the sedation medicines used during the test).    FOLLOW UP: Our staff will call the number listed on your records the next business day following your procedure.  We will call around 7:15- 8:00 am to check on you and address any questions or concerns that you may have regarding the information given to you following your procedure. If we do not reach you, we will leave a message.  If you develop any symptoms (ie: fever, flu-like symptoms, shortness of breath, cough etc.) before then, please call [phone].  If you test positive for Covid 19 in the 2 weeks post procedure, please call and report this information to [place].    If any biopsies were taken you will be contacted by phone or by letter within the next 1-3 weeks.  Please call us at [phone] if you have not heard about the biopsies in 3 weeks.    SIGNATURES/CONFIDENTIALITY: You and/or your care partner have signed paperwork which will be entered  into your electronic medical record.  These signatures attest to the fact that that the information above on your After Visit Summary has been reviewed and is understood.  Full responsibility of the confidentiality of this discharge information lies with you and/or your care-partner.

## 2021-10-02 NOTE — Progress Notes (Signed)
Indication for EGD: Gastric polyps, long term reflux Indication for colonoscopy: Colon cancer screening, family history of colon polyps  Please see my 09/25/21 office note for complete. There has been no change in history of physical exam. She remains an appropriate candidate for monitored anesthesia care in the [facility].

## 2021-10-02 NOTE — Op Note (Signed)
[facility] Patient Name: Sierra Perez Procedure Date: 10/02/2021 2:01 PM MRN: [id number] Endoscopist: Thornton Park MD, MD Age: 61 Referring MD:  Date of Birth: April 02, 1960 Gender: Female Account #: [id number] Procedure:                Colonoscopy Indications:              Screening for colorectal malignant neoplasm                           Incomplete colonoscopy last year due to poor prep                           Mother and brother with colon polyps                           Maternal grandfather with colon cancer Medicines:                Monitored Anesthesia Care Procedure:                Pre-Anesthesia Assessment:                           - Prior to the procedure, a History and Physical                            was performed, and patient medications and                            allergies were reviewed. The patient's tolerance of                            previous anesthesia was also reviewed. The risks                            and benefits of the procedure and the sedation                            options and risks were discussed with the patient.                            All questions were answered, and informed consent                            was obtained. Prior Anticoagulants: The patient has                            taken Eliquis (apixaban), last dose was 3 days                            prior to procedure. ASA Grade Assessment: III - A                            patient with severe systemic disease. After  reviewing the risks and benefits, the patient was                            deemed in satisfactory condition to undergo the                            procedure.                           After obtaining informed consent, the colonoscope                            was passed under direct vision. Throughout the                            procedure, the patient's blood pressure, pulse, and                             oxygen saturations were monitored continuously. The                            CF [id number] #[id number] was introduced through the anus                            and advanced to the 3 cm into the ileum. A second                            forward view of the right colon was performed. The                            colonoscopy was performed with moderate difficulty                            due to a redundant colon, significant looping and a                            tortuous colon. The patient tolerated the procedure                            well. The quality of the bowel preparation was                            good. Some gritty stool residue was present in the                            cecum but could be lavaged. 864m of yellowish                            liquid was removed during the procedure. The                            terminal ileum, ileocecal valve, appendiceal  orifice, and rectum were photographed. Scope In: 2:25:58 PM Scope Out: 2:44:40 PM Scope Withdrawal Time: 0 hours 12 minutes 14 seconds  Total Procedure Duration: 0 hours 18 minutes 42 seconds  Findings:                 The perianal and digital rectal examinations were                            normal.                           Non-bleeding internal hemorrhoids were found.                           A diffuse area of mild melanosis was found in the                            entire colon.                           Two sessile polyps were found in the cecum. The                            polyps were less than 1 mm in size. These polyps                            were removed with a cold biopsy forceps. Resection                            and retrieval were complete. Estimated blood loss                            was minimal.                           The colon is tortuous and redundant. The exam was                            otherwise without abnormality on direct and                             retroflexion views. Complications:            No immediate complications. Estimated Blood Loss:     Estimated blood loss was minimal. Impression:               - Non-bleeding internal hemorrhoids.                           - Melanosis in the colon.                           - Two less than 1 mm polyps in the cecum, removed                            with a cold biopsy forceps. Resected and retrieved.                           -  The examination was otherwise normal on direct                            and retroflexion views. Recommendation:           - Patient has a contact number available for                            emergencies. The signs and symptoms of potential                            delayed complications were discussed with the                            patient. Return to normal activities tomorrow.                            Written discharge instructions were provided to the                            patient.                           - Resume previous diet.                           - Continue present medications.                           - Await pathology results.                           - Repeat colonoscopy in 5 years for surveillance.                           - Resume Eliquis (apixaban) at prior dose tomorrow.                           - Emerging evidence supports eating a diet of                            fruits, vegetables, grains, calcium, and yogurt                            while reducing red meat and alcohol may reduce the                            risk of colon cancer.                           - Thank you for allowing me to be involved in your                            colon cancer prevention. Thornton Park MD, MD 10/02/2021 2:58:18 PM This report has been signed electronically.

## 2021-10-02 NOTE — Op Note (Signed)
[facility] Patient Name: Sierra Perez Procedure Date: 10/02/2021 2:02 PM MRN: [id number] Endoscopist: Thornton Park MD, MD Age: 61 Referring MD:  Date of Birth: 07-Jun-1960 Gender: Female Account #: [id number] Procedure:                Upper GI endoscopy Indications:              Follow-up of gastro-esophageal reflux disease,                            Follow-up of gastric polyps, history of gastric                            bypass Medicines:                Monitored Anesthesia Care Procedure:                Pre-Anesthesia Assessment:                           - Prior to the procedure, a History and Physical                            was performed, and patient medications and                            allergies were reviewed. The patient's tolerance of                            previous anesthesia was also reviewed. The risks                            and benefits of the procedure and the sedation                            options and risks were discussed with the patient.                            All questions were answered, and informed consent                            was obtained. Prior Anticoagulants: The patient has                            taken Eliquis (apixaban), last dose was 3 days                            prior to procedure. ASA Grade Assessment: III - A                            patient with severe systemic disease. After                            reviewing the risks and benefits, the patient was  deemed in satisfactory condition to undergo the                            procedure.                           After obtaining informed consent, the endoscope was                            passed under direct vision. Throughout the                            procedure, the patient's blood pressure, pulse, and                            oxygen saturations were monitored continuously. The                             Endoscope was introduced through the mouth, and                            advanced to the third part of duodenum. The upper                            GI endoscopy was accomplished without difficulty.                            The patient tolerated the procedure well. Scope In: Scope Out: Findings:                 The Z-line was irregular and was found 34 cm from                            the incisors. There were two short tongues at the                            z-lines measuring less than 1cm. Biopsies were                            taken with a cold forceps for histology. Estimated                            blood loss was minimal.                           Two small sessile polyps with no bleeding and no                            stigmata of recent bleeding were found in the                            gastric body. Biopsies were taken with a cold  forceps for histology. Estimated blood loss was                            minimal.                           The entire examined stomach was otherwise normal.                            Biopsies were taken from the antrum, body, and                            fundus with a cold forceps for histology. Estimated                            blood loss was minimal.                           A small hiatal hernia was present.                           The examined duodenum was normal. Complications:            No immediate complications. Estimated Blood Loss:     Estimated blood loss was minimal. Impression:               - Z-line irregular, 34 cm from the incisors.                            Biopsied to rule out Barrett's Esophagus.                           - Two benign appearing gastric polyps in the body.                            Biopsied.                           - Small hiatal hernia.                           - Normal stomach. Biopsied. Recommendation:           - Patient has a contact number available  for                            emergencies. The signs and symptoms of potential                            delayed complications were discussed with the                            patient. Return to normal activities tomorrow.                            Written discharge instructions were provided to the  patient.                           - Resume previous diet.                           - Continue present medications.                           - Await pathology results.                           - Proceed with colonoscopy today as previously                            planned. Thornton Park MD, MD 10/02/2021 2:53:21 PM This report has been signed electronically.

## 2021-10-03 ENCOUNTER — Telehealth: Payer: Self-pay | Admitting: *Deleted

## 2021-10-03 NOTE — Telephone Encounter (Signed)
  Follow up Call-     10/02/2021    1:20 PM  Call back number  Post procedure Call Back phone  # [phone]  Permission to leave phone message Yes     Patient questions:  Message left to call us if necessary.

## 2021-10-14 ENCOUNTER — Ambulatory Visit: Payer: 59 | Admitting: Physical Therapy

## 2021-10-17 ENCOUNTER — Encounter: Payer: Self-pay | Admitting: Physician Assistant

## 2021-10-17 ENCOUNTER — Ambulatory Visit: Payer: 59 | Admitting: Physician Assistant

## 2021-10-17 DIAGNOSIS — M1711 Unilateral primary osteoarthritis, right knee: Secondary | ICD-10-CM | POA: Diagnosis not present

## 2021-10-17 MED ORDER — LIDOCAINE HCL 1 % IJ SOLN
3.0000 mL | INTRAMUSCULAR | Status: AC | PRN
  Administered 2021-10-17: 3 mL

## 2021-10-17 NOTE — Progress Notes (Signed)
Office Visit Note   Patient: Sierra Perez           Date of Birth: [birth year]           MRN: [id number] Visit Date: 10/17/2021              Requested by: Wenda Low, MD [address] [place],  [place] [address] PCP: Wenda Low, MD  Chief Complaint  Patient presents with  . Right Knee - Pain      HPI: [name] is a pleasant 61 year old woman with a history of right knee arthritis and CPPD.  She also has RSD symptoms especially in her right knee.  She is status post left knee replacement done elsewhere with complications including blood clots.  She is status post right hip replacement with Dr. Ninfa Linden last year.  With regards to her right hip she is quite pleased.  She has had a history of recurrent effusions in her right knee.  She said she denies any injury but over the last couple days has had swelling and tightness in the right knee.  She called this morning was wondering if her knee could be aspirated.  She has not done well with steroid injections in the past  Assessment & Plan: Visit Diagnoses: No diagnosis found.  Plan: 65 cc of serous fluid was aspirated from her knee today.  There is no evidence of any purulence or infection.  She is going to see how she does with this.  She is thinking about having her right knee replaced.  She will contact Dr. Ninfa Linden and his team when she is ready to schedule this.  Follow-Up Instructions: As needed  Ortho Exam  Patient is alert, oriented, no adenopathy, well-dressed, normal affect, normal respiratory effort.  Right knee she has no redness no erythema she does have some slight warmth and a large effusion.  Globally sensitive to touch throughout her knee.  Compartments of the lower leg are soft and nontender  Imaging: No results found. No images are attached to the encounter.  Labs: Lab Results  Component Value Date   HGBA1C 5.5 09/23/2021   HGBA1C 7.0 (H) 11/15/2020   HGBA1C 7.1 (H) 07/02/2020     Lab Results   Component Value Date   ALBUMIN 4.3 11/16/2020    Lab Results  Component Value Date   MG 1.9 11/16/2020   No results found for: "VD25OH"  No results found for: "PREALBUMIN"    Latest Ref Rng & Units 08/14/2021    3:05 PM 03/07/2021    8:23 AM 11/21/2020    2:59 AM  CBC EXTENDED  WBC 3.4 - 10.8 x10E3/uL 9.6  6.1  8.3   RBC 3.77 - 5.28 x10E6/uL 4.49  4.73  2.74   Hemoglobin 11.1 - 15.9 g/dL 13.8  12.7  7.1   HCT 34.0 - 46.6 % 39.8  39.4  22.4   Platelets 150 - 450 x10E3/uL 245  232  169   NEUT# 1.7 - 7.7 K/uL  3.6    Lymph# 0.7 - 4.0 K/uL  1.8       There is no height or weight on file to calculate BMI.  Orders:  No orders of the defined types were placed in this encounter.  No orders of the defined types were placed in this encounter.    Procedures: Large Joint Inj: R knee on 10/17/2021 1:50 PM Indications: pain and diagnostic evaluation Details: 25 G 1.5 in needle, superolateral approach  Arthrogram: No  Medications: 3 mL lidocaine 1 % Aspirate: 65 mL yellow Outcome: tolerated well, no immediate complications  After verbal consent was obtained the superior lateral border of her patella was prepped with alcohol and Betadine x2.  3 cc of lidocaine plain was injected.  After adequate analgesia a 60 cc syringe was inserted with an 18-gauge needle.  Ultimately 65 cc of serous fluid was aspirated.  There was no blood.  Syringe was removed Band-Aid was placed in a compressive Ace wrap was placed.  Patient tolerated the procedure well Procedure, treatment alternatives, risks and benefits explained, specific risks discussed. Consent was given by the patient.    Clinical Data: No additional findings.  ROS:  All other systems negative, except as noted in the HPI. Review of Systems  Objective: Vital Signs: There were no vitals taken for this visit.  Specialty Comments:  No specialty comments available.  PMFS History: Patient Active Problem List   Diagnosis Date  Noted  . Obesity (BMI 30-39.9) 06/23/2021  . Iron deficiency anemia 01/23/2021  . Status post right hip replacement 12/03/2020  . Status post total replacement of right hip 11/19/2020  . Breast cancer ([place]) 07/08/2020  . 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  . H/O total hysterectomy 06/11/2020  . Genetic testing 05/27/2020  . Chronic superficial gastritis 05/27/2020  . Fibromyalgia 05/27/2020  . GERD (gastroesophageal reflux disease) 05/27/2020  . Prediabetes 05/27/2020  . Complex regional pain syndrome I, unspecified 05/27/2020  . Atypical lobular hyperplasia [facility]) of left breast 05/23/2020  . History of therapeutic radiation 05/23/2020  . Family history of breast cancer   . Family history of colon cancer   . Personal history of malignant neoplasm of breast   . Unilateral primary osteoarthritis, right knee 02/26/2020  . Unilateral primary osteoarthritis, 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  . 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 DVT (deep venous thrombosis) ([place]) 05/04/2018  . Malignant neoplasm of upper-outer quadrant of right breast in female, estrogen receptor negative ([place]) 05/03/2018  . Obstructive sleep apnea (adult) (pediatric) 09/14/2017  . Adenopathy 07/15/2017  . B12 deficiency 07/15/2017  . Disorder of tympanic membrane of right ear 07/15/2017  . Recurrent UTI 03/09/2017  . Bladder pain 02/25/2017  . Proteinuria 02/25/2017  . Hematoma of thigh, left, subsequent encounter 08/16/2016  . At risk for obstructive sleep apnea 07/01/2016  . Suspected sleep apnea 07/01/2016  . Arthritis of left knee 05/26/2016  . Localized primary osteoarthritis of lower leg, left 05/26/2016  . Clotting disorder ([place]) 05/22/2016  . Chronic anticoagulation 01/06/2016  . Right-sided epistaxis 01/06/2016  . Urinary tract infection, site not specified 08/12/2015  . Abnormal Pap smear of vagina 05/06/2015  . Follow-up examination, following other surgery 12/14/2013  . Pelvic and perineal pain 11/28/2013  . Anxiety  and depression 11/16/2013  . H/O bariatric surgery 11/16/2013  . Atrophic vaginitis 11/13/2013  . Tear of lateral cartilage or meniscus of knee, current 04/14/2013  . Allergic rhinitis 01/16/2013  . Female genital symptoms 11/10/2012  . Symptomatic menopausal or female climacteric states 11/10/2012  . Disequilibrium 06/09/2012  . Morbid obesity ([place]) 02/09/2012  . Vitamin D deficiency 09/13/2008  . Preop examination 04/20/2008  . Hypothyroidism 05/20/2007   Past Medical History:  Diagnosis Date  . Anemia   . Anxiety   . Arthritis   . Asthma   . 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 Clotting disorder ([place])    testing was negative, h/o DVT while on treatment  . Colon  polyps   . Complex regional pain syndrome i of right lower limb    RDS  . Cystitis   . Diabetes mellitus without complication ([place])   . DVT (deep venous thrombosis) ([place])    started in 2015, multiple  . Family history of breast cancer   . Family history of colon cancer   . Fibroid   . Fibromyalgia   . GERD (gastroesophageal reflux disease)   . Heart murmur   . HLD (hyperlipidemia)   . HPV in female   . 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 Hypertension   . Hypothyroidism   . IBS (irritable bowel syndrome)   . Mitral valve prolapse   . Peptic ulcer   . Personal history of malignant neoplasm of breast   . Sleep apnea treated with continuous positive airway pressure (CPAP)     Family History  Problem Relation Age of Onset  . Hypertension Mother   . Skin cancer Mother   . Colon polyps Mother   . Valvular heart disease Mother   . Kidney disease Mother   . Diabetes Father   . Kidney disease Father   . Dementia Father   . Factor VIII deficiency Father   . Colon polyps Brother   . Bladder Cancer Maternal Grandmother        dx in late 67s; smoker  . Stroke Maternal Grandmother   . Diabetes Maternal Grandmother   . Lung cancer Maternal  Grandfather   . Colon cancer Maternal Grandfather 10       origin  . Thyroid cancer Maternal Grandfather        dx in his 87s  . Breast cancer Paternal Grandmother        dx in 26s  . Prostate cancer Other        MGMs brother    Past Surgical History:  Procedure Laterality Date  . ABDOMINAL HYSTERECTOMY  2010   fibroids, h/o abnormal pap smears  . AXILLARY SENTINEL NODE BIOPSY Right 07/08/2020   Procedure: RIGHT AXILLARY SENTINEL NODE BIOPSY;  Surgeon: Rolm Bookbinder, MD;  Location: [facility];  Service: General;  Laterality: Right;  . BREAST RECONSTRUCTION WITH PLACEMENT OF TISSUE EXPANDER AND FLEX HD (ACELLULAR HYDRATED DERMIS) Bilateral 07/08/2020   Procedure: BILATERAL BREAST RECONSTRUCTION WITH  FLEX HD (ACELLULAR HYDRATED DERMIS),DIRECT IMPLANT RECONSTRUCTION;  Surgeon: Cindra Presume, MD;  Location: [facility];  Service: Plastics;  Laterality: Bilateral;  . BREAST SURGERY Right 2015   right lumpectomy with reduction and lift  . COLONOSCOPY    . DENTAL SURGERY    . ESOPHAGOGASTRODUODENOSCOPY    . [place] RESECTION  2014  . LAPAROSCOPIC OOPHERECTOMY Bilateral 2016  . REPLACEMENT TOTAL KNEE Left 2018   also had 3 prior arthroscopies  . TOE SURGERY Right    dislocated toe  . TOTAL HIP ARTHROPLASTY Right 11/19/2020   Procedure: RIGHT TOTAL HIP ARTHROPLASTY ANTERIOR APPROACH;  Surgeon: Mcarthur Rossetti, MD;  Location: [facility];  Service: Orthopedics;  Laterality: Right;  . TOTAL MASTECTOMY Bilateral 07/08/2020   Procedure: BILATERAL TOTAL MASTECTOMY;  Surgeon: Rolm Bookbinder, MD;  Location: [facility];  Service: General;  Laterality: Bilateral;  . VENA CAVA FILTER PLACEMENT  2017   Social History   Occupational History  . Occupation: [employer]  Tobacco Use  . Smoking status: Never  . Smokeless tobacco: Never  Vaping Use  . Vaping Use: Never used  Substance and Sexual Activity  . Alcohol use:  Yes    Comment: occ  . Drug use: Not Currently    Types:  Marijuana  . Sexual activity: Not Currently    Birth control/protection: Surgical    Comment: Hysterectomy

## 2021-10-19 ENCOUNTER — Emergency Department (HOSPITAL_COMMUNITY)
Admission: EM | Admit: 2021-10-19 | Discharge: 2021-10-19 | Disposition: A | Discharge status: Home / Self Care | Payer: 59 | Attending: Emergency Medicine | Admitting: Emergency Medicine

## 2021-10-19 ENCOUNTER — Encounter (HOSPITAL_COMMUNITY): Payer: Self-pay

## 2021-10-19 ENCOUNTER — Emergency Department (HOSPITAL_BASED_OUTPATIENT_CLINIC_OR_DEPARTMENT_OTHER): Payer: 59

## 2021-10-19 ENCOUNTER — Emergency Department (HOSPITAL_COMMUNITY): Payer: 59

## 2021-10-19 DIAGNOSIS — M25461 Effusion, right knee: Secondary | ICD-10-CM | POA: Insufficient documentation

## 2021-10-19 DIAGNOSIS — M25561 Pain in right knee: Secondary | ICD-10-CM

## 2021-10-19 DIAGNOSIS — M7989 Other specified soft tissue disorders: Secondary | ICD-10-CM

## 2021-10-19 DIAGNOSIS — Z7901 Long term (current) use of anticoagulants: Secondary | ICD-10-CM | POA: Diagnosis not present

## 2021-10-19 DIAGNOSIS — Z86718 Personal history of other venous thrombosis and embolism: Secondary | ICD-10-CM

## 2021-10-19 LAB — CBC WITH DIFFERENTIAL/PLATELET
Abs Immature Granulocytes: 0.06 10*3/uL (ref 0.00–0.07)
Basophils Absolute: 0 10*3/uL (ref 0.0–0.1)
Basophils Relative: 0 %
Eosinophils Absolute: 0.1 10*3/uL (ref 0.0–0.5)
Eosinophils Relative: 1 %
HCT: 38.5 % (ref 36.0–46.0)
Hemoglobin: 13.1 g/dL (ref 12.0–15.0)
Immature Granulocytes: 1 %
Lymphocytes Relative: 10 %
Lymphs Abs: 1.1 10*3/uL (ref 0.7–4.0)
MCH: 31.3 pg (ref 26.0–34.0)
MCHC: 34 g/dL (ref 30.0–36.0)
MCV: 91.9 fL (ref 80.0–100.0)
Monocytes Absolute: 0.9 10*3/uL (ref 0.1–1.0)
Monocytes Relative: 7 %
Neutro Abs: 9.6 10*3/uL — ABNORMAL HIGH (ref 1.7–7.7)
Neutrophils Relative %: 81 %
Platelets: 234 10*3/uL (ref 150–400)
RBC: 4.19 MIL/uL (ref 3.87–5.11)
RDW: 13.2 % (ref 11.5–15.5)
WBC: 11.7 10*3/uL — ABNORMAL HIGH (ref 4.0–10.5)
nRBC: 0 % (ref 0.0–0.2)

## 2021-10-19 LAB — BASIC METABOLIC PANEL
Anion gap: 7 (ref 5–15)
BUN: 21 mg/dL — ABNORMAL HIGH (ref 6–20)
CO2: 23 mmol/L (ref 22–32)
Calcium: 10 mg/dL (ref 8.9–10.3)
Chloride: 107 mmol/L (ref 98–111)
Creatinine, Ser: 0.73 mg/dL (ref 0.44–1.00)
GFR, Estimated: >60 mL/min (ref >60)
Glucose, Bld: 124 mg/dL — ABNORMAL HIGH (ref 70–99)
Potassium: 4.1 mmol/L (ref 3.5–5.1)
Sodium: 137 mmol/L (ref 135–145)

## 2021-10-19 LAB — SYNOVIAL CELL COUNT + DIFF, W/ CRYSTALS
Crystals, Fluid: NONE SEEN
Lymphocytes-Synovial Fld: 2 % (ref 0–20)
Monocyte-Macrophage-Synovial Fluid: 11 % — ABNORMAL LOW (ref 50–90)
Neutrophil, Synovial: 87 % — ABNORMAL HIGH (ref 0–25)
WBC, Synovial: 14490 /mm3 — ABNORMAL HIGH (ref 0–200)

## 2021-10-19 MED ORDER — OXYCODONE-ACETAMINOPHEN 5-325 MG PO TABS
1.0000 | ORAL_TABLET | Freq: Once | ORAL | Status: AC
  Administered 2021-10-19: 1 via ORAL
  Filled 2021-10-19: qty 1

## 2021-10-19 MED ORDER — BUPIVACAINE HCL (PF) 0.5 % IJ SOLN
20.0000 mL | Freq: Once | INTRAMUSCULAR | Status: DC
  Filled 2021-10-19: qty 30

## 2021-10-19 MED ORDER — KETOROLAC TROMETHAMINE 30 MG/ML IJ SOLN
30.0000 mg | Freq: Once | INTRAMUSCULAR | Status: AC
  Administered 2021-10-19: 30 mg via INTRAMUSCULAR
  Filled 2021-10-19: qty 1

## 2021-10-19 MED ORDER — PREDNISONE 10 MG (21) PO TBPK: ORAL_TABLET | Freq: Every day | ORAL | 0 refills | Status: DC

## 2021-10-19 NOTE — ED Triage Notes (Signed)
Pt states she has chronic knee issues to R knee and is awaiting a knee replacement on that side. She states that affected knee frequently swells due to pseudogout and has to have it drained by ortho. Last drainage procedure was Friday with 65 mL output, and pt states that immediately following procedure she experienced severe pain which is unusual following procedure. Pt states that since then she has been having limited ROM and increased pain to affected knee.

## 2021-10-19 NOTE — Progress Notes (Signed)
Lower extremity venous right study completed.  Preliminary results relayed to Melina Copa, MD.  See CV Proc for preliminary results report.   Darlin Coco, RDMS, RVT

## 2021-10-19 NOTE — Discharge Instructions (Addendum)
You were seen today for right knee pain.  There was an effusion and fluid on the right knee.  35 mL were drained.  The fluid analysis does not suggest septic arthritis at this time.  Please take the prescribed steroid to help inflammation and follow-up with Ortho care for further evaluation and management.

## 2021-10-19 NOTE — ED Provider Notes (Signed)
[facility] DEPT Provider Note   CSN: [id number] Arrival date & time: 10/19/21  5366     History  Chief Complaint  Patient presents with   Knee Pain    Sierra Perez is a 61 y.o. female.  Patient presents to the emergency department complaining of right-sided knee pain.  Patient states she has a history of pseudogout and was seen by orthopedics on Friday.  She states they aspirated 65 mL of fluid at that appointment and did not inject her with steroids.  She states that yesterday she began to notice extreme pain in the right knee.  She called their office who gave her return precautions including severe pain and warmth which would necessitate emergency department evaluation.  Patient states this morning her pain was severe, 9 out of 10 in severity, worse than pain that she had secondary to her left knee replacement.  Patient was in tears upon arrival.  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 Authorizing Provider  predniSONE (STERAPRED UNI-PAK 21 TAB) 10 MG (21) TBPK tablet Take by mouth daily. Take 6 tabs by mouth daily  for 2 days, then 5 tabs for 2 days, then 4 tabs for 2 days, then 3 tabs for 2 days, 2 tabs for 2 days, then 1 tab by mouth daily for 2 days 10/19/21  Yes Amie Critchley, Casimer Leek, PA-C  albuterol (VENTOLIN HFA) 108 (90 Base) MCG/ACT inhaler Inhale 1 puff into the lungs every 6 (six) hours as needed for wheezing or shortness of breath. 01/18/19   [provider]  amLODipine (NORVASC) 5 MG tablet Take 5 mg by mouth daily. 06/05/20   [provider]  anastrozole (ARIMIDEX) 1 MG tablet Take 1 tablet (1 mg total) by mouth daily. 03/10/21   Nicholas Lose, MD  CALCIUM CITRATE PO Take 2 tablets by mouth daily. Zinc  Copper Vit d   Manganese Sodium    [provider]  Cholecalciferol (VITAMIN D-3) 25 MCG (1000 UT) CAPS Take 1,000 Units by mouth daily.    [provider]  DULoxetine (CYMBALTA) 30 MG capsule 30 mg daily. 08/01/20   [provider]  DULoxetine (CYMBALTA) 60 MG capsule Take 60 mg by mouth daily.    [provider]  ELIQUIS 5 MG TABS tablet TAKE 1 TABLET(5 MG) BY MOUTH TWICE DAILY 07/03/21   Nicholas Lose, MD  fluticasone (FLONASE) 50 MCG/ACT nasal spray Place 2 sprays into both nostrils daily.    [provider]  gabapentin (NEURONTIN) 600 MG tablet Take 600 mg by mouth 2 (two) times daily. 01/28/21   [provider]  ipratropium (ATROVENT) 0.03 % nasal spray Place 2 sprays into both nostrils 2 (two) times daily. 05/21/19   [provider]  Lactobacillus Rhamnosus, GG, (CULTURELLE PO) Take 1 capsule by mouth daily. Patient not taking: Reported on 10/02/2021    [provider]  levothyroxine (SYNTHROID) 50 MCG tablet Take 50 mcg by mouth every morning. 05/18/19   [provider]  pantoprazole (PROTONIX) 40 MG tablet Take 1 tablet (40 mg total) by mouth daily. 11/09/18   Nicholas Lose, MD  rosuvastatin (CRESTOR) 5 MG tablet Take 1 tablet (5 mg total) by mouth daily. 03/25/21 09/25/21  Bensimhon, Shaune Pascal, MD  tirzepatide [facility]) 7.5 MG/0.5ML Pen Inject 7.5 mg  into the skin once a week. 09/24/21   Bensimhon, Shaune Pascal, MD  valACYclovir (VALTREX) 500 MG tablet Take 1 tablet (500 mg total) by mouth daily. Increase to bid x 3 days with symptoms 08/14/21   Megan Salon, MD      Allergies    Succinylcholine, Azithromycin, Keflex [cephalexin], Silver, Sulfa antibiotics, and Sulfasalazine    Review of Systems   Review of Systems  Constitutional:  Negative for fever.  Respiratory:  Negative for shortness of breath.   Cardiovascular:  Negative for chest pain and palpitations.  Gastrointestinal:  Negative for abdominal pain and nausea.   Musculoskeletal:  Positive for arthralgias and joint swelling.    Physical Exam Updated Vital Signs BP 124/69 (BP Location: Left Arm)   Pulse 65   Temp 98.2 F (36.8 C) (Oral)   Resp 14   SpO2 100%  Physical Exam Vitals and nursing note reviewed.  Constitutional:      General: She is not in acute distress.    Appearance: She is well-developed.  HENT:     Head: Normocephalic and atraumatic.  Eyes:     Conjunctiva/sclera: Conjunctivae normal.  Cardiovascular:     Rate and Rhythm: Normal rate.  Pulmonary:     Effort: Pulmonary effort is normal. No respiratory distress.     Breath sounds: Normal breath sounds.  Abdominal:     General: Abdomen is flat.  Musculoskeletal:        General: Swelling and tenderness present.     Cervical back: Neck supple.     Comments: Patient with obvious effusion of the right knee.  Limited range of motion due to pain.  Small, 1 cm diameter erythematous region noted near site of previous aspiration.  Right knee is warm.  Skin:    General: Skin is warm and dry.     Capillary Refill: Capillary refill takes less than 2 seconds.  Neurological:     Mental Status: She is alert.  Psychiatric:        Mood and Affect: Mood normal.     ED Results / Procedures / Treatments   Labs (all labs ordered are listed, but only abnormal results are displayed) Labs Reviewed  CBC WITH DIFFERENTIAL/PLATELET - Abnormal; Notable for the following components:      Result Value   WBC 11.7 (*)    Neutro Abs 9.6 (*)    All other components within normal limits  BASIC METABOLIC PANEL - Abnormal; Notable for the following components:   Glucose, Bld 124 (*)    BUN 21 (*)    All other components within normal limits  SYNOVIAL CELL COUNT + DIFF, W/ CRYSTALS - Abnormal; Notable for the following components:   Color, Synovial YELLOW (*)    Appearance-Synovial TURBID (*)    WBC, Synovial 14,490 (*)    Neutrophil, Synovial 87 (*)    Monocyte-Macrophage-Synovial Fluid 11  (*)    All other components within normal limits  BODY FLUID CULTURE W GRAM STAIN  GLUCOSE, BODY FLUID OTHER            PROTEIN, BODY FLUID (OTHER)    EKG None  Radiology DG Knee Complete 4 Views Right  Result Date: 10/19/2021 CLINICAL DATA:  Right knee effusion. EXAM: RIGHT KNEE - COMPLETE 4+ VIEW COMPARISON:  Right knee radiographs dated 12/21/2019. FINDINGS: No evidence of fracture or dislocation. There is a moderate knee joint effusion. Tricompartmental osteoarthritis is mild in the medial and lateral compartments and moderate in the patellofemoral compartment.  Chondrocalcinosis is noted. Soft tissues are unremarkable. IMPRESSION: Moderate knee joint effusion.  No acute osseous injury. Electronically Signed   By: Zerita Boers M.D.   On: 10/19/2021 13:45   VAS [place] LOWER EXTREMITY VENOUS (DVT) (7a-7p)  Result Date: 10/19/2021  Lower Venous DVT Study Patient Name:  Sierra Perez  Date of Exam:   10/19/2021 Medical Rec #: [id number]    Accession #:    [id number] Date of Birth: Jul 24, 1960   Patient Gender: F Patient Age:   12 years Exam Location:  [facility] Procedure:      VAS [place] LOWER EXTREMITY VENOUS (DVT) Referring Phys: Cherlynn June --------------------------------------------------------------------------------  Indications: Right knee pain and swelling, history of DVT (left).  Risk Factors: Cancer. Comparison Study: 02-16-2020 Prior right lower extremity venous study was                   negative for DVT. Performing Technologist: Darlin Coco RDMS, RVT  Examination Guidelines: A complete evaluation includes B-mode imaging, spectral Doppler, color Doppler, and power Doppler as needed of all accessible portions of each vessel. Bilateral testing is considered an integral part of a complete examination. Limited examinations for reoccurring indications may be performed as noted. The reflux portion of the exam is performed with the patient in reverse Trendelenburg.   +---------+---------------+---------+-----------+----------+--------------+ RIGHT    CompressibilityPhasicitySpontaneityPropertiesThrombus Aging +---------+---------------+---------+-----------+----------+--------------+ CFV      Full           Yes      Yes                                 +---------+---------------+---------+-----------+----------+--------------+ SFJ      Full                                                        +---------+---------------+---------+-----------+----------+--------------+ FV Prox  Full                                                        +---------+---------------+---------+-----------+----------+--------------+ FV Mid   Full                                                        +---------+---------------+---------+-----------+----------+--------------+ FV DistalFull                                                        +---------+---------------+---------+-----------+----------+--------------+ PFV      Full                                                        +---------+---------------+---------+-----------+----------+--------------+ POP  Full           Yes      Yes                                 +---------+---------------+---------+-----------+----------+--------------+ PTV      Full                                                        +---------+---------------+---------+-----------+----------+--------------+ PERO     Full                                                        +---------+---------------+---------+-----------+----------+--------------+ Gastroc  Full                                                        +---------+---------------+---------+-----------+----------+--------------+   +----+---------------+---------+-----------+----------+--------------+ LEFTCompressibilityPhasicitySpontaneityPropertiesThrombus Aging  +----+---------------+---------+-----------+----------+--------------+ CFV Full           Yes      Yes                                 +----+---------------+---------+-----------+----------+--------------+    Summary: RIGHT: - There is no evidence of deep vein thrombosis in the lower extremity.  - A cystic structure is found in the popliteal fossa. 4.1 x 1.2 cm.  LEFT: - No evidence of common femoral vein obstruction.  *See table(s) above for measurements and observations.    Preliminary     Procedures .Joint Aspiration/Arthrocentesis  Date/Time: 10/19/2021 11:07 AM  Performed by: Dorothyann Peng, PA-C Authorized by: Dorothyann Peng, PA-C   Consent:    Consent obtained:  Verbal   Consent given by:  Patient   Risks, benefits, and alternatives were discussed: yes     Risks discussed:  Bleeding, infection, pain, nerve damage and incomplete drainage   Alternatives discussed:  No treatment and observation Universal protocol:    Procedure explained and questions answered to patient or proxy's satisfaction: yes     Relevant documents present and verified: yes     Test results available: yes     Imaging studies available: yes     Required blood products, implants, devices, and special equipment available: yes     Site/side marked: yes     Immediately prior to procedure, a time out was called: yes     Patient identity confirmed:  Verbally with patient and arm band Location:    Location:  Knee   Knee:  R knee Anesthesia:    Anesthesia method:  Local infiltration   Local anesthetic:  Bupivacaine 0.5% w/o epi Procedure details:    Preparation: Patient was prepped and draped in usual sterile fashion     Needle gauge:  18 G   Ultrasound guidance: yes     Approach:  Superior   Aspirate amount:  35   Aspirate characteristics:  Cloudy   Steroid injected: no  Specimen collected: yes   Post-procedure details:    Dressing:  Sterile dressing   Procedure completion:  Tolerated well,  no immediate complications     Medications Ordered in ED Medications  bupivacaine(PF) (MARCAINE) 0.5 % injection 20 mL (has no administration in time range)  oxyCODONE-acetaminophen (PERCOCET/ROXICET) 5-325 MG per tablet 1 tablet (1 tablet Oral Given 10/19/21 0819)  ketorolac (TORADOL) 30 MG/ML injection 30 mg (30 mg Intramuscular Given 10/19/21 1355)    ED Course/ Medical Decision Making/ A&P Clinical Course as of 10/19/21 1426  Sun Oct 19, 2021  1244 Neutrophil, Synovial: PENDING [LM]  1246 Eosinophils-Synovial: PENDING [LM]    Clinical Course User Index [LM] Ronny Bacon                           Medical Decision Making Amount and/or Complexity of Data Reviewed Labs: ordered. Decision-making details documented in ED Course. Radiology: ordered.  Risk Prescription drug management.   This patient presents to the ED for concern of right knee pain, this involves an extensive number of treatment options, and is a complaint that carries with it a high risk of complications and morbidity.  The differential diagnosis includes septic arthritis, pseudogout, injury, gout, and others   Co morbidities that complicate the patient evaluation  History of pseudogout, RSD   Additional history obtained:   External records from outside source obtained and reviewed including orthopedic notes from Friday showing 65 mL of yellow aspirate with no immediate complication and no subsequent steroid injection   Lab Tests:  I Ordered, and personally interpreted labs.  The pertinent results include: WBC 11.7, glucose 124,   Imaging Studies ordered:  I ordered imaging studies including DVT study of the right lower extremity.  I also ordered plain films of the right knee I independently visualized and interpreted imaging which showed no acute DVT.  Plain films show no fracture or dislocation.  Effusion noted. I agree with the radiologist interpretation   Consultations Obtained:  I  spoke with Dr.Xu with [facility] who agreed with my plan to aspirate knee for synovial fluid analysis.  I followed up with him after synovial fluid was analyzed and he suggested Toradol and a steroid taper prescription with outpatient follow-up.  He also requested new x-rays of the right knee.   Problem List / ED Course / Critical interventions / Medication management   I ordered medication including Percocet for pain, Toradol for inflammation Reevaluation of the patient after these medicines showed that the patient improved I have reviewed the patients home medicines and have made adjustments as needed   Test / Admission - Considered:  Patient presents with a right knee effusion.  No signs of injury at this time.  Unclear etiology of the patient's inflammation but it appears unlikely to be a septic joint at this time.  Patient will be discharged home with steroid taper pack and will follow-up with [facility].        Final Clinical Impression(s) / ED Diagnoses Final diagnoses:  Effusion of right knee    Rx / DC Orders ED Discharge Orders          Ordered    predniSONE (STERAPRED UNI-PAK 21 TAB) 10 MG (21) TBPK tablet  Daily        10/19/21 1313              Ronny Bacon 10/19/21 1426    Hayden Rasmussen, MD 10/19/21  1815  

## 2021-10-20 LAB — PROTEIN, BODY FLUID (OTHER): Total Protein, Body Fluid Other: 3.6 g/dL

## 2021-10-20 LAB — GLUCOSE, BODY FLUID OTHER: Glucose, Body Fluid Other: 87 mg/dL

## 2021-10-21 ENCOUNTER — Ambulatory Visit: Payer: 59 | Admitting: Podiatry

## 2021-10-22 ENCOUNTER — Encounter: Payer: Self-pay | Admitting: Orthopaedic Surgery

## 2021-10-22 ENCOUNTER — Ambulatory Visit: Payer: 59 | Admitting: Orthopaedic Surgery

## 2021-10-22 DIAGNOSIS — M1711 Unilateral primary osteoarthritis, right knee: Secondary | ICD-10-CM

## 2021-10-22 LAB — BODY FLUID CULTURE W GRAM STAIN: Culture: NO GROWTH

## 2021-10-22 NOTE — Progress Notes (Signed)
The patient is well-known to [place].  We have replaced her right hip last year.  She is only 61 years old.  She has well-documented severe arthritis of her right knee.  She has a remote history of arthroscopic surgery on that right knee and fortunately developed RSD with a complex regional pain syndrome that still affects her today.  She did very well with her right hip replacement.  She actually has a history of a left knee replacement done elsewhere.  She is on chronic Eliquis from blood clots in the past.  She was bridged with Lovenox prior to her hip replacement surgery.  She has had multiple aspirations recently from her right knee and MRI of the right knee last year showed areas of full-thickness cartilage loss in all 3 compartments.  Her right knee pain has become daily and is 10 out of 10.  It is definitely detrimentally affecting her mobility, her quality of life and actives daily living.  She has had multiple injections in that knee as well including steroids and hyaluronic acid.  On examination the right knee there is medial lateral joint line tenderness and a moderate effusion again today.  She has painful arc of motion of the right knee.  At this point she is interested in proceeding with knee replacement surgery and I agree with this given years of failure of conservative treatment of the right knee and given her recurrent effusions combined with MRI and plain film findings of the right knee.  We had a long and thorough discussion about the things we would need to do to try to prevent severe pain and overt RSD again.  She did let me know she had to have a manipulation under anesthesia of her left knee soon after that knee replacement.  We are going to try to do everything we can to avoid that.  We discussed in detail the risk and benefits of the surgery.  She would need to be bridged with Lovenox again with being off Eliquis for 3 days prior to surgery.  She would like to have this in December and I  agree with this as well.  We will work on getting her surgery scheduled.  She will contact her hematologist about the Lovenox dosing and bridging and we will be in touch about when to start that depending on the surgery date with stopping Eliquis again for 3 full days prior to surgery.  All questions and concerns were answered and addressed.

## 2021-10-28 ENCOUNTER — Encounter: Payer: 59 | Admitting: Physical Therapy

## 2021-10-31 ENCOUNTER — Telehealth: Payer: Self-pay | Admitting: Orthopaedic Surgery

## 2021-10-31 NOTE — Telephone Encounter (Signed)
Received $50.00     $25.00 for each form, Medical records release forms and FMLA disability paperwork from patient / Forwarding to [facility] today

## 2021-11-06 ENCOUNTER — Other Ambulatory Visit: Payer: Self-pay

## 2021-11-10 ENCOUNTER — Ambulatory Visit: Payer: 59 | Attending: Hematology and Oncology

## 2021-11-10 VITALS — Wt 205.4 lb

## 2021-11-10 DIAGNOSIS — Z483 Aftercare following surgery for neoplasm: Secondary | ICD-10-CM | POA: Insufficient documentation

## 2021-11-10 NOTE — Therapy (Signed)
OUTPATIENT PHYSICAL THERAPY SOZO SCREENING NOTE   Patient Name: Sierra Perez MRN: [id number] DOB:06-23-1960, 61 y.o., female Today's Date: 11/10/2021  PCP: Wenda Low, MD REFERRING PROVIDER: Nicholas Lose, MD   PT End of Session - 11/10/21 0802     Visit Number 4   # unchanged due to screen only   PT Start Time 0758    PT Stop Time 0804    PT Time Calculation (min) 6 min    Activity Tolerance Patient tolerated treatment well    Behavior During Therapy [facility] for tasks assessed/performed             Past Medical History:  Diagnosis Date   Anemia    Anxiety    Arthritis    Asthma    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, her-2 positive, ER/PR negative   Clotting disorder ([place])    testing was negative, h/o DVT while on treatment   Colon polyps    Complex regional pain syndrome i of right lower limb    RDS   Cystitis    Diabetes mellitus without complication ([place])    DVT (deep venous thrombosis) ([place])    started in 2015, multiple   Family history of breast cancer    Family history of colon cancer    Fibroid    Fibromyalgia    GERD (gastroesophageal reflux disease)    Heart murmur    HLD (hyperlipidemia)    HPV in female    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   Hypertension    Hypothyroidism    IBS (irritable bowel syndrome)    Mitral valve prolapse    Peptic ulcer    Personal history of malignant neoplasm of breast    Sleep apnea treated with continuous positive airway pressure (CPAP)    Past Surgical History:  Procedure Laterality Date   ABDOMINAL HYSTERECTOMY  2010   fibroids, h/o abnormal pap smears   AXILLARY SENTINEL NODE BIOPSY Right 07/08/2020   Procedure: RIGHT AXILLARY SENTINEL NODE BIOPSY;  Surgeon: Rolm Bookbinder, MD;  Location: [facility];  Service: General;  Laterality: Right;   BREAST RECONSTRUCTION WITH PLACEMENT OF TISSUE EXPANDER AND FLEX HD (ACELLULAR HYDRATED DERMIS) Bilateral 07/08/2020   Procedure: BILATERAL  BREAST RECONSTRUCTION WITH  FLEX HD (ACELLULAR HYDRATED DERMIS),DIRECT IMPLANT RECONSTRUCTION;  Surgeon: Cindra Presume, MD;  Location: [facility];  Service: Plastics;  Laterality: Bilateral;   BREAST SURGERY Right 2015   right lumpectomy with reduction and lift   COLONOSCOPY     DENTAL SURGERY     ESOPHAGOGASTRODUODENOSCOPY     LAPAROSCOPIC GASTRIC SLEEVE RESECTION  2014   LAPAROSCOPIC OOPHERECTOMY Bilateral 2016   REPLACEMENT TOTAL KNEE Left 2018   also had 3 prior arthroscopies   TOE SURGERY Right    dislocated toe   TOTAL HIP ARTHROPLASTY Right 11/19/2020   Procedure: RIGHT TOTAL HIP ARTHROPLASTY ANTERIOR APPROACH;  Surgeon: Mcarthur Rossetti, MD;  Location: [facility];  Service: Orthopedics;  Laterality: Right;   TOTAL MASTECTOMY Bilateral 07/08/2020   Procedure: BILATERAL TOTAL MASTECTOMY;  Surgeon: Rolm Bookbinder, MD;  Location: [place];  Service: General;  Laterality: Bilateral;   VENA CAVA FILTER PLACEMENT  2017   Patient Active Problem List   Diagnosis Date Noted   Obesity (BMI 30-39.9) 06/23/2021   Iron deficiency anemia 01/23/2021   Status post right hip replacement 12/03/2020   Status post total replacement of right hip 11/19/2020   Breast cancer ([place]) 07/08/2020  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   H/O total hysterectomy 06/11/2020   Genetic testing 05/27/2020   Chronic superficial gastritis 05/27/2020   Fibromyalgia 05/27/2020   GERD (gastroesophageal reflux disease) 05/27/2020   Prediabetes 05/27/2020   Complex regional pain syndrome I, unspecified 05/27/2020   Atypical lobular hyperplasia [facility]) of left breast 05/23/2020   History of therapeutic radiation 05/23/2020   Family history of breast cancer    Family history of colon cancer    Personal history of malignant neoplasm of breast    Unilateral primary osteoarthritis, right knee 02/26/2020   Unilateral primary osteoarthritis, 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   DVT (deep venous thrombosis)  ([place]) 05/04/2018   Malignant neoplasm of upper-outer quadrant of right breast in female, estrogen receptor negative ([place]) 05/03/2018   Obstructive sleep apnea (adult) (pediatric) 09/14/2017   Adenopathy 07/15/2017   B12 deficiency 07/15/2017   Disorder of tympanic membrane of right ear 07/15/2017   Recurrent UTI 03/09/2017   Bladder pain 02/25/2017   Proteinuria 02/25/2017   Hematoma of thigh, left, subsequent encounter 08/16/2016   At risk for obstructive sleep apnea 07/01/2016   Suspected sleep apnea 07/01/2016   Arthritis of left knee 05/26/2016   Localized primary osteoarthritis of lower leg, left 05/26/2016   Clotting disorder ([place]) 05/22/2016   Chronic anticoagulation 01/06/2016   Right-sided epistaxis 01/06/2016   Urinary tract infection, site not specified 08/12/2015   Abnormal Pap smear of vagina 05/06/2015   Follow-up examination, following other surgery 12/14/2013   Pelvic and perineal pain 11/28/2013   Anxiety and depression 11/16/2013   H/O bariatric surgery 11/16/2013   Atrophic vaginitis 11/13/2013   Tear of lateral cartilage or meniscus of knee, current 04/14/2013   Allergic rhinitis 01/16/2013   Female genital symptoms 11/10/2012   Symptomatic menopausal or female climacteric states 11/10/2012   Disequilibrium 06/09/2012   Morbid obesity ([place]) 02/09/2012   Vitamin D deficiency 09/13/2008   Preop examination 04/20/2008   Hypothyroidism 05/20/2007    REFERRING DIAG: right breast cancer at risk for lymphedema  THERAPY DIAG:  Aftercare following surgery for neoplasm  PERTINENT HISTORY: 2014 Rt breast cancer with lumpectomy with SLNB, radiation, and chemotherapy. Recurrence on the Rt with bil mastectomy with SLNB on 07/08/20 due to grade 2 ILC ER/PR positive.  Other history includes clotting disorder and DVT history. Had a SOZO score increased of 14.3 on 03/13/21 which decreased back to normal in 04/10/21.   PRECAUTIONS: right UE Lymphedema risk, None  SUBJECTIVE: Pt  returns for her 3 month L-Dex screen.   PAIN:  Are you having pain? No  SOZO SCREENING: Patient was assessed today using the SOZO machine to determine the lymphedema index score. This was compared to her baseline score. It was determined that she is within the recommended range when compared to her baseline and no further action is needed at this time. She will continue SOZO screenings. These are done every 3 months for 2 years post operatively followed by every 6 months for 2 years, and then annually.   L-DEX FLOWSHEETS - 11/10/21 0800       L-DEX LYMPHEDEMA SCREENING   Measurement Type Unilateral    L-DEX MEASUREMENT EXTREMITY Upper Extremity    POSITION  Standing    DOMINANT SIDE Left    At Risk Side Right    BASELINE SCORE (UNILATERAL) 5.9    L-DEX SCORE (UNILATERAL) 4.9    VALUE CHANGE (UNILAT) -1  Otelia Limes, PTA 11/10/2021, 8:05 AM

## 2021-11-13 NOTE — Telephone Encounter (Signed)
Patient is requesting handicap placard, please advise

## 2021-11-13 NOTE — Telephone Encounter (Signed)
Patient aware handicap at front desk  

## 2021-11-26 ENCOUNTER — Ambulatory Visit (INDEPENDENT_AMBULATORY_CARE_PROVIDER_SITE_OTHER): Payer: 59 | Admitting: Podiatrist

## 2021-11-26 DIAGNOSIS — M722 Plantar fascial fibromatosis: Secondary | ICD-10-CM

## 2021-11-26 MED ORDER — TRIAMCINOLONE ACETONIDE 10 MG/ML IJ SUSP: 10.0000 mg | Freq: Once | INTRAMUSCULAR | Status: AC

## 2021-12-01 ENCOUNTER — Telehealth: Payer: Self-pay

## 2021-12-01 NOTE — Telephone Encounter (Signed)
Just checking on path letter for  [place] in August

## 2021-12-02 NOTE — Progress Notes (Signed)
Chief Complaint  Patient presents with   Plantar Fasciitis    Patient states PF had gotten better but now her pain is back. Patient states pain varies. Pain is a sharp shooting pain , when pressure is applied. No swelling , no redness.     HPI: Patient is 61 y.o. female who presents today for recurrence of plantar fasciitis left foot.  She relates it has improved but n ow is back.  She relates pain with first step in the morning. She is wearing her good sneakers and is doing the stretching exercises.     Allergies  Allergen Reactions   Succinylcholine Other (See Comments)    Muscle aching  18 years ago from 07/02/20   Azithromycin     **DOES NOT WORK** **DOES NOT WORK**    Keflex [Cephalexin] Diarrhea and Nausea And Vomiting   Silver Rash   Sulfa Antibiotics Rash    And swelling generalized not in throat   Sulfasalazine Rash    Review of systems is negative except as noted in the HPI.  Denies nausea/ vomiting/ fevers/ chills or night sweats.   Denies difficulty breathing, denies calf pain or tenderness  Physical Exam  Patient is awake, alert, and oriented x 3.  In no acute distress.    Vascular status is intact with palpable pedal pulses DP and PT bilateral and capillary refill time less than 3 seconds bilateral.  No edema or erythema noted.   Neurological exam reveals epicritic and protective sensation grossly intact bilateral.   Dermatological exam reveals skin is supple and dry to bilateral feet.  No open lesions present.    Musculoskeletal exam: Musculature intact with dorsiflexion, plantarflexion, inversion, eversion. Pain on palpation plantar medial heel of the left foot. No pain along the posterior tibial tendon today.   Assessment:   ICD-10-CM   1. Plantar fasciitis  M72.2 triamcinolone acetonide (KENALOG) 10 MG/ML injection 10 mg       Plan: Treatment options discussed and Sierra Perez is interested in another injection as these have been helpful for her in the past.   An injection for the left foot was carried out today without complication.  She will call if symptoms fail to resolve and otherwise will be seen back as needed for follow up.  Procedure: Injection left foot Discussed alternatives, risks, complications and verbal consent was obtained.  Location: plantar fascia insertion left foot.  Skin Prep: Alcohol. Injectate: 1cc 0.5% marcaine plain, 1 cc 10 mg Kenalog Disposition: Patient tolerated procedure well. Injection site dressed with a band-aid.  Post-injection care was discussed and return precautions discussed.

## 2021-12-03 ENCOUNTER — Ambulatory Visit: Payer: Self-pay

## 2021-12-03 ENCOUNTER — Encounter: Payer: Self-pay | Admitting: Family

## 2021-12-03 ENCOUNTER — Ambulatory Visit (INDEPENDENT_AMBULATORY_CARE_PROVIDER_SITE_OTHER): Payer: 59 | Admitting: Family

## 2021-12-03 DIAGNOSIS — M25522 Pain in left elbow: Secondary | ICD-10-CM

## 2021-12-03 DIAGNOSIS — M7022 Olecranon bursitis, left elbow: Secondary | ICD-10-CM | POA: Diagnosis not present

## 2021-12-03 DIAGNOSIS — T148XXA Other injury of unspecified body region, initial encounter: Secondary | ICD-10-CM | POA: Diagnosis not present

## 2021-12-03 NOTE — Progress Notes (Signed)
Office Visit Note   Patient: Sierra Perez           Date of Birth: 1960-04-20           MRN: [id number] Visit Date: 12/03/2021              Requested by: Wenda Low, MD [address] [place],  [place] [address] PCP: Wenda Low, MD  Chief Complaint  Patient presents with   Left Elbow - Pain      HPI: The patient is a 61 year old woman who presents today complaining of left elbow swelling and bruising she states that she woke Monday and had pain with bending of her arm when she began to try to work at the keyboard she noticed that her lower arm was swollen and painful she had some associated numbness  She has not been able to recall any injury to the elbow or arm no neck pain no injury to the neck  Left hand dominant  She is on Eliquis  Assessment & Plan: Visit Diagnoses:  1. Pain in left elbow   2. Hematoma   3. Olecranon bursitis of left elbow     Plan: Unable to aspirate any blood from the hematoma.  Warm compress applied with a compression wrap she will continue with compression wrapping and alternate with a warm compress.  Follow-Up Instructions: No follow-ups on file.   Left Elbow Exam   Tenderness  The patient is experiencing tenderness in the olecranon fossa.   Range of Motion  The patient has normal left elbow ROM.  Muscle Strength  The patient has normal left elbow strength.  Other  Erythema: absent Sensation: normal Pulse: present  Comments:  Pain with pronation supination  Ecchymosis seen to the upper arm as well as the lower arm fluctuance about the olecranon without erythema or warmth no wound or insect bite      Patient is alert, oriented, no adenopathy, well-dressed, normal affect, normal respiratory effort.   Imaging: No results found. No images are attached to the encounter.  Labs: Lab Results  Component Value Date   HGBA1C 5.5 09/23/2021   HGBA1C 7.0 (H) 11/15/2020   HGBA1C 7.1 (H) 07/02/2020    REPTSTATUS 10/22/2021 FINAL 10/19/2021   GRAMSTAIN  10/19/2021    FEW WBC PRESENT, PREDOMINANTLY PMN NO ORGANISMS SEEN    CULT  10/19/2021    NO GROWTH 3 DAYS Performed at [facility], [place] [address] [place], [place] [address]      Lab Results  Component Value Date   ALBUMIN 4.3 11/16/2020    Lab Results  Component Value Date   MG 1.9 11/16/2020   No results found for: "VD25OH"  No results found for: "PREALBUMIN"    Latest Ref Rng & Units 10/19/2021    8:28 AM 08/14/2021    3:05 PM 03/07/2021    8:23 AM  CBC EXTENDED  WBC 4.0 - 10.5 K/uL 11.7  9.6  6.1   RBC 3.87 - 5.11 MIL/uL 4.19  4.49  4.73   Hemoglobin 12.0 - 15.0 g/dL 13.1  13.8  12.7   HCT 36.0 - 46.0 % 38.5  39.8  39.4   Platelets 150 - 400 K/uL 234  245  232   NEUT# 1.7 - 7.7 K/uL 9.6   3.6   Lymph# 0.7 - 4.0 K/uL 1.1   1.8      There is no height or weight on file to calculate BMI.  Orders:  Orders Placed This Encounter  Procedures   XR Elbow Complete Left (3+View)   No orders of the defined types were placed in this encounter.    Procedures: No procedures performed  Clinical Data: No additional findings.  ROS:  All other systems negative, except as noted in the HPI. Review of Systems  Objective: Vital Signs: There were no vitals taken for this visit.  Specialty Comments:  No specialty comments available.  PMFS History: Patient Active Problem List   Diagnosis Date Noted   Obesity (BMI 30-39.9) 06/23/2021   Iron deficiency anemia 01/23/2021   Status post right hip replacement 12/03/2020   Status post total replacement of right hip 11/19/2020   Breast cancer ([place]) 07/08/2020   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   H/O total hysterectomy 06/11/2020   Genetic testing 05/27/2020   Chronic superficial gastritis 05/27/2020   Fibromyalgia 05/27/2020   GERD (gastroesophageal reflux disease) 05/27/2020   Prediabetes 05/27/2020   Complex regional pain syndrome I, unspecified  05/27/2020   Atypical lobular hyperplasia [facility]) of left breast 05/23/2020   History of therapeutic radiation 05/23/2020   Family history of breast cancer    Family history of colon cancer    Personal history of malignant neoplasm of breast    Unilateral primary osteoarthritis, right knee 02/26/2020   Unilateral primary osteoarthritis, 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   DVT (deep venous thrombosis) ([place]) 05/04/2018   Malignant neoplasm of upper-outer quadrant of right breast in female, estrogen receptor negative ([place]) 05/03/2018   Obstructive sleep apnea (adult) (pediatric) 09/14/2017   Adenopathy 07/15/2017   B12 deficiency 07/15/2017   Disorder of tympanic membrane of right ear 07/15/2017   Recurrent UTI 03/09/2017   Bladder pain 02/25/2017   Proteinuria 02/25/2017   Hematoma of thigh, left, subsequent encounter 08/16/2016   At risk for obstructive sleep apnea 07/01/2016   Suspected sleep apnea 07/01/2016   Arthritis of left knee 05/26/2016   Localized primary osteoarthritis of lower leg, left 05/26/2016   Clotting disorder ([place]) 05/22/2016   Chronic anticoagulation 01/06/2016   Right-sided epistaxis 01/06/2016   Urinary tract infection, site not specified 08/12/2015   Abnormal Pap smear of vagina 05/06/2015   Follow-up examination, following other surgery 12/14/2013   Pelvic and perineal pain 11/28/2013   Anxiety and depression 11/16/2013   H/O bariatric surgery 11/16/2013   Atrophic vaginitis 11/13/2013   Tear of lateral cartilage or meniscus of knee, current 04/14/2013   Allergic rhinitis 01/16/2013   Female genital symptoms 11/10/2012   Symptomatic menopausal or female climacteric states 11/10/2012   Disequilibrium 06/09/2012   Morbid obesity ([place]) 02/09/2012   Vitamin D deficiency 09/13/2008   Preop examination 04/20/2008   Hypothyroidism 05/20/2007   Past Medical History:  Diagnosis Date   Anemia    Anxiety    Arthritis    Asthma    Breast  cancer ([place]) 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, her-2 positive, ER/PR negative   Clotting disorder ([place])    testing was negative, h/o DVT while on treatment   Colon polyps    Complex regional pain syndrome i of right lower limb    RDS   Cystitis    Diabetes mellitus without complication ([place])    DVT (deep venous thrombosis) ([place])    started in 2015, multiple   Family history of breast cancer    Family history of colon cancer    Fibroid    Fibromyalgia    GERD (gastroesophageal reflux disease)    Heart  murmur    HLD (hyperlipidemia)    HPV in female    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   Hypertension    Hypothyroidism    IBS (irritable bowel syndrome)    Mitral valve prolapse    Peptic ulcer    Personal history of malignant neoplasm of breast    Sleep apnea treated with continuous positive airway pressure (CPAP)     Family History  Problem Relation Age of Onset   Hypertension Mother    Skin cancer Mother    Colon polyps Mother    Valvular heart disease Mother    Kidney disease Mother    Diabetes Father    Kidney disease Father    Dementia Father    Factor VIII deficiency Father    Colon polyps Brother    Bladder Cancer Maternal Grandmother        dx in late 30s; smoker   Stroke Maternal Grandmother    Diabetes Maternal Grandmother    Lung cancer Maternal Grandfather    Colon cancer Maternal Grandfather 15       origin   Thyroid cancer Maternal Grandfather        dx in his 35s   Breast cancer Paternal Grandmother        dx in 30s   Prostate cancer Other        MGMs brother    Past Surgical History:  Procedure Laterality Date   ABDOMINAL HYSTERECTOMY  2010   fibroids, h/o abnormal pap smears   AXILLARY SENTINEL NODE BIOPSY Right 07/08/2020   Procedure: RIGHT AXILLARY SENTINEL NODE BIOPSY;  Surgeon: Rolm Bookbinder, MD;  Location: [place];  Service: General;  Laterality: Right;   BREAST RECONSTRUCTION WITH PLACEMENT OF TISSUE EXPANDER AND FLEX HD  (ACELLULAR HYDRATED DERMIS) Bilateral 07/08/2020   Procedure: BILATERAL BREAST RECONSTRUCTION WITH  FLEX HD (ACELLULAR HYDRATED DERMIS),DIRECT IMPLANT RECONSTRUCTION;  Surgeon: Cindra Presume, MD;  Location: [facility];  Service: Plastics;  Laterality: Bilateral;   BREAST SURGERY Right 2015   right lumpectomy with reduction and lift   COLONOSCOPY     DENTAL SURGERY     ESOPHAGOGASTRODUODENOSCOPY     LAPAROSCOPIC GASTRIC SLEEVE RESECTION  2014   LAPAROSCOPIC OOPHERECTOMY Bilateral 2016   REPLACEMENT TOTAL KNEE Left 2018   also had 3 prior arthroscopies   TOE SURGERY Right    dislocated toe   TOTAL HIP ARTHROPLASTY Right 11/19/2020   Procedure: RIGHT TOTAL HIP ARTHROPLASTY ANTERIOR APPROACH;  Surgeon: Mcarthur Rossetti, MD;  Location: [place];  Service: Orthopedics;  Laterality: Right;   TOTAL MASTECTOMY Bilateral 07/08/2020   Procedure: BILATERAL TOTAL MASTECTOMY;  Surgeon: Rolm Bookbinder, MD;  Location: [place];  Service: General;  Laterality: Bilateral;   VENA CAVA FILTER PLACEMENT  2017   Social History   Occupational History   Occupation: [employer]  Tobacco Use   Smoking status: Never   Smokeless tobacco: Never  Vaping Use   Vaping Use: Never used  Substance and Sexual Activity   Alcohol use: Yes    Comment: occ   Drug use: Not Currently    Types: Marijuana   Sexual activity: Not Currently    Birth control/protection: Surgical    Comment: Hysterectomy

## 2021-12-05 ENCOUNTER — Telehealth: Payer: Self-pay | Admitting: Pharmacist

## 2021-12-05 MED ORDER — MOUNJARO 10 MG/0.5ML ~~LOC~~ SOAJ: 10.0000 mg | SUBCUTANEOUS | 11 refills | Status: DC

## 2021-12-05 NOTE — Telephone Encounter (Signed)
Patient called requesting to increase Mounjaro to '10mg'$ . Feels like her hunger is coming back. I have sent Rx to pharmacy. Advised that there is some supply issues with '10mg'$ . She may want to call other pharmacies if [facility] cannot get.

## 2021-12-09 ENCOUNTER — Ambulatory Visit: Payer: 59

## 2021-12-09 ENCOUNTER — Ambulatory Visit (HOSPITAL_BASED_OUTPATIENT_CLINIC_OR_DEPARTMENT_OTHER)
Admission: RE | Admit: 2021-12-09 | Discharge: 2021-12-09 | Disposition: A | Discharge status: Home / Self Care | Payer: 59 | Source: Ambulatory Visit | Attending: Adult Health | Admitting: Adult Health

## 2021-12-09 ENCOUNTER — Inpatient Hospital Stay: Payer: 59 | Attending: Adult Health

## 2021-12-09 ENCOUNTER — Other Ambulatory Visit: Payer: Self-pay | Admitting: *Deleted

## 2021-12-09 ENCOUNTER — Other Ambulatory Visit: Payer: Self-pay

## 2021-12-09 ENCOUNTER — Inpatient Hospital Stay (HOSPITAL_BASED_OUTPATIENT_CLINIC_OR_DEPARTMENT_OTHER): Payer: 59 | Admitting: Adult Health

## 2021-12-09 ENCOUNTER — Telehealth: Payer: Self-pay | Admitting: *Deleted

## 2021-12-09 ENCOUNTER — Encounter: Payer: Self-pay | Admitting: Adult Health

## 2021-12-09 ENCOUNTER — Inpatient Hospital Stay: Payer: 59

## 2021-12-09 VITALS — BP 110/81 | HR 74 | Resp 18 | Ht 67.0 in | Wt 200.0 lb

## 2021-12-09 DIAGNOSIS — E039 Hypothyroidism, unspecified: Secondary | ICD-10-CM | POA: Diagnosis not present

## 2021-12-09 DIAGNOSIS — I82409 Acute embolism and thrombosis of unspecified deep veins of unspecified lower extremity: Secondary | ICD-10-CM

## 2021-12-09 DIAGNOSIS — Z79811 Long term (current) use of aromatase inhibitors: Secondary | ICD-10-CM | POA: Diagnosis not present

## 2021-12-09 DIAGNOSIS — M1990 Unspecified osteoarthritis, unspecified site: Secondary | ICD-10-CM | POA: Insufficient documentation

## 2021-12-09 DIAGNOSIS — G4730 Sleep apnea, unspecified: Secondary | ICD-10-CM | POA: Diagnosis not present

## 2021-12-09 DIAGNOSIS — K219 Gastro-esophageal reflux disease without esophagitis: Secondary | ICD-10-CM | POA: Diagnosis not present

## 2021-12-09 DIAGNOSIS — D509 Iron deficiency anemia, unspecified: Secondary | ICD-10-CM

## 2021-12-09 DIAGNOSIS — I10 Essential (primary) hypertension: Secondary | ICD-10-CM | POA: Diagnosis not present

## 2021-12-09 DIAGNOSIS — Z171 Estrogen receptor negative status [ER-]: Secondary | ICD-10-CM | POA: Insufficient documentation

## 2021-12-09 DIAGNOSIS — Z9221 Personal history of antineoplastic chemotherapy: Secondary | ICD-10-CM | POA: Insufficient documentation

## 2021-12-09 DIAGNOSIS — Z800 Family history of malignant neoplasm of digestive organs: Secondary | ICD-10-CM | POA: Diagnosis not present

## 2021-12-09 DIAGNOSIS — C50411 Malignant neoplasm of upper-outer quadrant of right female breast: Secondary | ICD-10-CM

## 2021-12-09 DIAGNOSIS — Z803 Family history of malignant neoplasm of breast: Secondary | ICD-10-CM | POA: Diagnosis not present

## 2021-12-09 DIAGNOSIS — M7989 Other specified soft tissue disorders: Secondary | ICD-10-CM

## 2021-12-09 DIAGNOSIS — M797 Fibromyalgia: Secondary | ICD-10-CM | POA: Insufficient documentation

## 2021-12-09 DIAGNOSIS — I341 Nonrheumatic mitral (valve) prolapse: Secondary | ICD-10-CM | POA: Diagnosis not present

## 2021-12-09 DIAGNOSIS — D689 Coagulation defect, unspecified: Secondary | ICD-10-CM | POA: Diagnosis not present

## 2021-12-09 DIAGNOSIS — Z86718 Personal history of other venous thrombosis and embolism: Secondary | ICD-10-CM | POA: Insufficient documentation

## 2021-12-09 DIAGNOSIS — E538 Deficiency of other specified B group vitamins: Secondary | ICD-10-CM | POA: Diagnosis not present

## 2021-12-09 DIAGNOSIS — Z79899 Other long term (current) drug therapy: Secondary | ICD-10-CM | POA: Diagnosis not present

## 2021-12-09 DIAGNOSIS — Z923 Personal history of irradiation: Secondary | ICD-10-CM | POA: Diagnosis not present

## 2021-12-09 LAB — CBC WITH DIFFERENTIAL (CANCER CENTER ONLY)
Abs Immature Granulocytes: 0.03 10*3/uL (ref 0.00–0.07)
Basophils Absolute: 0 10*3/uL (ref 0.0–0.1)
Basophils Relative: 0 %
Eosinophils Absolute: 0 10*3/uL (ref 0.0–0.5)
Eosinophils Relative: 1 %
HCT: 37 % (ref 36.0–46.0)
Hemoglobin: 13 g/dL (ref 12.0–15.0)
Immature Granulocytes: 0 %
Lymphocytes Relative: 23 %
Lymphs Abs: 1.6 10*3/uL (ref 0.7–4.0)
MCH: 31.1 pg (ref 26.0–34.0)
MCHC: 35.1 g/dL (ref 30.0–36.0)
MCV: 88.5 fL (ref 80.0–100.0)
Monocytes Absolute: 0.5 10*3/uL (ref 0.1–1.0)
Monocytes Relative: 8 %
Neutro Abs: 4.7 10*3/uL (ref 1.7–7.7)
Neutrophils Relative %: 68 %
Platelet Count: 237 10*3/uL (ref 150–400)
RBC: 4.18 MIL/uL (ref 3.87–5.11)
RDW: 12.5 % (ref 11.5–15.5)
WBC Count: 6.9 10*3/uL (ref 4.0–10.5)
nRBC: 0 % (ref 0.0–0.2)

## 2021-12-09 LAB — CMP (CANCER CENTER ONLY)
ALT: 16 U/L (ref 0–44)
AST: 19 U/L (ref 15–41)
Albumin: 4.5 g/dL (ref 3.5–5.0)
Alkaline Phosphatase: 63 U/L (ref 38–126)
Anion gap: 8 (ref 5–15)
BUN: 16 mg/dL (ref 8–23)
CO2: 27 mmol/L (ref 22–32)
Calcium: 10.1 mg/dL (ref 8.9–10.3)
Chloride: 105 mmol/L (ref 98–111)
Creatinine: 0.81 mg/dL (ref 0.44–1.00)
GFR, Estimated: >60 mL/min (ref >60)
Glucose, Bld: 85 mg/dL (ref 70–99)
Potassium: 3.7 mmol/L (ref 3.5–5.1)
Sodium: 140 mmol/L (ref 135–145)
Total Bilirubin: 0.9 mg/dL (ref 0.3–1.2)
Total Protein: 7.4 g/dL (ref 6.5–8.1)

## 2021-12-09 LAB — IRON AND IRON BINDING CAPACITY (CC-WL,HP ONLY)
Iron: 68 ug/dL (ref 28–170)
Saturation Ratios: 18 % (ref 10.4–31.8)
TIBC: 370 ug/dL (ref 250–450)
UIBC: 302 ug/dL (ref 148–442)

## 2021-12-09 LAB — SAMPLE TO BLOOD BANK

## 2021-12-09 LAB — FERRITIN: Ferritin: 104 ng/mL (ref 11–307)

## 2021-12-09 LAB — ABO/RH: ABO/RH(D): O POS

## 2021-12-09 NOTE — Progress Notes (Signed)
[name] Follow up:    Sierra Low, MD [address] [place] [address]   DIAGNOSIS: Cancer Staging  Malignant neoplasm of upper-outer quadrant of right breast in female, estrogen receptor negative ([place]) Staging form: Breast, AJCC 8th Edition - Clinical stage from 08/27/2012: Stage IIA (cT2, cN0, cM0, G3, ER-, PR-, HER2+) - Signed by Sierra Lose, MD on 11/17/2019 Stage prefix: Initial diagnosis Histologic grading system: 3 grade system   SUMMARY OF ONCOLOGIC HISTORY: Oncology History  Malignant neoplasm of upper-outer quadrant of right breast in female, estrogen receptor negative ([place])  08/27/2012 Initial Diagnosis   Stage 1a IDC with DCIS (T2N0M0) : ER-, PR-, HER positive 3+, Ki67 20-25%   08/27/2012 Cancer Staging   Staging form: Breast, AJCC 8th Edition - Clinical stage from 08/27/2012: Stage IIA (cT2, cN0, cM0, G3, ER-, PR-, HER2+) - Signed by Sierra Lose, MD on 11/17/2019   09/06/2012 Breast MRI   Right breast, lateral to the nipple, 10x14x17m mass with mildly irregular borders   09/10/2012 Echocardiogram   EF 55-60%    Chemotherapy   Neoadjuvant Taxotere/Carboplatin/Herceptin/Perjeta followed by adjuvant Herceptin maintenance   01/09/2013 Breast MRI   Complete pathological response to neoadjuvant treatment: right breast mass previously noted at 9:00 position no longer identified.   02/13/2013 Surgery   Right breast lumpectomy: benign parenchyma with some stromal fibrosis consistent with previous tumor site, focal area of atypical lobular hyperplasia, all lymph nodes negative.     Radiation Therapy   Adjuvant radiation   04/06/2013 Imaging   DEXA: T-score of -1.1, osteopenia    09/25/2016 Initial Biopsy   Columnar cell alteration with atypia, foreign body granuloma   05/10/2020 Relapse/Recurrence   Breast MRI detected new linear non-mass enhancement right breast 2.1 cm, non-mass enhancement centrally left breast spanning 10.5  cm, left breast nipple areolar complex enhancement: Biopsy right breast: Grade 2 invasive lobular cancer.  ER/PR positive HER-2 equivocal by IHC FISH pending, Ki-67 1% left breast biopsy anterior fibrocystic change, posterior atypical lobular hyperplasia/LCIS   05/26/2020 Genetic Testing   Negative genetic testing on the CancerNext-Expanded+RNAinsight panel.  The [facility] includes sequencing and rearrangement analysis for the following 77 genes: AIP, ALK, APC*, ATM*, AXIN2, BAP1, BARD1, BLM, BMPR1A, BRCA1*, BRCA2*, BRIP1*, CDC73, CDH1*, CDK4, CDKN1B, CDKN2A, CHEK2*, CTNNA1, DICER1, FANCC, FH, FLCN, GALNT12, KIF1B, LZTR1, MAX, MEN1, MET, MLH1*, MSH2*, MSH3, MSH6*, MUTYH*, NBN, NF1*, NF2, NTHL1, PALB2*, PHOX2B, PMS2*, POT1, PRKAR1A, PTCH1, PTEN*, RAD51C*, RAD51D*, RB1, RECQL, RET, SDHA, SDHAF2, SDHB, SDHC, SDHD, SMAD4, SMARCA4, SMARCB1, SMARCE1, STK11, SUFU, TMEM127, TP53*, TSC1, TSC2, VHL and XRCC2 (sequencing and deletion/duplication); EGFR, EGLN1, HOXB13, KIT, MITF, PDGFRA, POLD1, and POLE (sequencing only); EPCAM and GREM1 (deletion/duplication only). DNA and RNA analyses performed for * genes. The report date is May 26, 2020.      CURRENT THERAPY:  INTERVAL HISTORY: Sierra Perez 61y.o. female returns for    Patient Active Problem List   Diagnosis Date Noted  . Obesity (BMI 30-39.9) 06/23/2021  . Iron deficiency anemia 01/23/2021  . Status post right hip replacement 12/03/2020  . Status post total replacement of right hip 11/19/2020  . Breast cancer ([place] 07/08/2020  . 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  . H/O total hysterectomy 06/11/2020  . Genetic testing 05/27/2020  . Chronic superficial gastritis 05/27/2020  . Fibromyalgia 05/27/2020  . GERD (gastroesophageal reflux disease) 05/27/2020  . Prediabetes 05/27/2020  . Complex regional pain syndrome I, unspecified 05/27/2020  .  Atypical lobular hyperplasia [facility]) of left breast  05/23/2020  . History of therapeutic radiation 05/23/2020  . Family history of breast cancer   . Family history of colon cancer   . Personal history of malignant neoplasm of breast   . Unilateral primary osteoarthritis, right knee 02/26/2020  . Unilateral primary osteoarthritis, 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  . 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 DVT (deep venous thrombosis) ([place]) 05/04/2018  . Malignant neoplasm of upper-outer quadrant of right breast in female, estrogen receptor negative ([place]) 05/03/2018  . Obstructive sleep apnea (adult) (pediatric) 09/14/2017  . Adenopathy 07/15/2017  . B12 deficiency 07/15/2017  . Disorder of tympanic membrane of right ear 07/15/2017  . Recurrent UTI 03/09/2017  . Bladder pain 02/25/2017  . Proteinuria 02/25/2017  . Hematoma of thigh, left, subsequent encounter 08/16/2016  . At risk for obstructive sleep apnea 07/01/2016  . Suspected sleep apnea 07/01/2016  . Arthritis of left knee 05/26/2016  . Localized primary osteoarthritis of lower leg, left 05/26/2016  . Clotting disorder ([place]) 05/22/2016  . Chronic anticoagulation 01/06/2016  . Right-sided epistaxis 01/06/2016  . Urinary tract infection, site not specified 08/12/2015  . Abnormal Pap smear of vagina 05/06/2015  . Follow-up examination, following other surgery 12/14/2013  . Pelvic and perineal pain 11/28/2013  . Anxiety and depression 11/16/2013  . H/O bariatric surgery 11/16/2013  . Atrophic vaginitis 11/13/2013  . Tear of lateral cartilage or meniscus of knee, current 04/14/2013  . Allergic rhinitis 01/16/2013  . Female genital symptoms 11/10/2012  . Symptomatic menopausal or female climacteric states 11/10/2012  . Disequilibrium 06/09/2012  . Morbid obesity ([place]) 02/09/2012  . Vitamin D deficiency 09/13/2008  . Preop examination 04/20/2008  . Hypothyroidism 05/20/2007    is allergic to succinylcholine, azithromycin, keflex [cephalexin], silver, sulfa antibiotics, and sulfasalazine.  MEDICAL  HISTORY: Past Medical History:  Diagnosis Date  . Anemia   . Anxiety   . Arthritis   . Asthma   . Breast cancer ([place]) 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, her-2 positive, ER/PR negative  . Clotting disorder ([place])    testing was negative, h/o DVT while on treatment  . Colon polyps   . Complex regional pain syndrome i of right lower limb    RDS  . Cystitis   . Diabetes mellitus without complication ([place])   . DVT (deep venous thrombosis) ([place])    started in 2015, multiple  . Family history of breast cancer   . Family history of colon cancer   . Fibroid   . Fibromyalgia   . GERD (gastroesophageal reflux disease)   . Heart murmur   . HLD (hyperlipidemia)   . HPV in female   . 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 Hypertension   . Hypothyroidism   . IBS (irritable bowel syndrome)   . Mitral valve prolapse   . Peptic ulcer   . Personal history of malignant neoplasm of breast   . Sleep apnea treated with continuous positive airway pressure (CPAP)     SURGICAL HISTORY: Past Surgical History:  Procedure Laterality Date  . ABDOMINAL HYSTERECTOMY  2010   fibroids, h/o abnormal pap smears  . AXILLARY SENTINEL NODE BIOPSY Right 07/08/2020   Procedure: RIGHT AXILLARY SENTINEL NODE BIOPSY;  Surgeon: Rolm Bookbinder, MD;  Location: [facility];  Service: General;  Laterality: Right;  . BREAST RECONSTRUCTION WITH PLACEMENT OF TISSUE EXPANDER AND FLEX HD (ACELLULAR HYDRATED DERMIS) Bilateral 07/08/2020   Procedure: BILATERAL BREAST RECONSTRUCTION WITH  FLEX HD (  ACELLULAR HYDRATED DERMIS),DIRECT IMPLANT RECONSTRUCTION;  Surgeon: Cindra Presume, MD;  Location: [place];  Service: Plastics;  Laterality: Bilateral;  . BREAST SURGERY Right 2015   right lumpectomy with reduction and lift  . COLONOSCOPY    . DENTAL SURGERY    . ESOPHAGOGASTRODUODENOSCOPY    . [place] RESECTION  2014  . LAPAROSCOPIC OOPHERECTOMY Bilateral 2016  . REPLACEMENT TOTAL KNEE Left 2018    also had 3 prior arthroscopies  . TOE SURGERY Right    dislocated toe  . TOTAL HIP ARTHROPLASTY Right 11/19/2020   Procedure: RIGHT TOTAL HIP ARTHROPLASTY ANTERIOR APPROACH;  Surgeon: Mcarthur Rossetti, MD;  Location: [place];  Service: Orthopedics;  Laterality: Right;  . TOTAL MASTECTOMY Bilateral 07/08/2020   Procedure: BILATERAL TOTAL MASTECTOMY;  Surgeon: Rolm Bookbinder, MD;  Location: [place] [facility];  Service: General;  Laterality: Bilateral;  . VENA CAVA FILTER PLACEMENT  2017    SOCIAL HISTORY: Social History   Socioeconomic History  . Marital status: Single    Spouse name: Not on file  . Number of children: 1  . Years of education: Not on file  . Highest education level: Not on file  Occupational History  . Occupation: [employer]  Tobacco Use  . Smoking status: Never  . Smokeless tobacco: Never  Vaping Use  . Vaping Use: Never used  Substance and Sexual Activity  . Alcohol use: Yes    Comment: occ  . Drug use: Not Currently    Types: Marijuana  . Sexual activity: Not Currently    Birth control/protection: Surgical    Comment: Hysterectomy  Other Topics Concern  . Not on file  Social History Narrative  . Not on file   Social Determinants of Health   Financial Resource Strain: Not on file  Food Insecurity: Not on file  Transportation Needs: Not on file  Physical Activity: Not on file  Stress: Not on file  Social Connections: Not on file  Intimate Partner Violence: Not on file    FAMILY HISTORY: Family History  Problem Relation Age of Onset  . Hypertension Mother   . Skin cancer Mother   . Colon polyps Mother   . Valvular heart disease Mother   . Kidney disease Mother   . Diabetes Father   . Kidney disease Father   . Dementia Father   . Factor VIII deficiency Father   . Colon polyps Brother   . Bladder Cancer Maternal Grandmother        dx in late 23s; smoker  . Stroke Maternal Grandmother   . Diabetes Maternal Grandmother   . Lung cancer Maternal  Grandfather   . Colon cancer Maternal Grandfather 55       origin  . Thyroid cancer Maternal Grandfather        dx in his 62s  . Breast cancer Paternal Grandmother        dx in 67s  . Prostate cancer Other        MGMs brother    Review of Systems - Oncology    PHYSICAL EXAMINATION  ECOG PERFORMANCE STATUS: {CHL ONC ECOG NW:[id number]}  Vitals:   12/09/21 1234  BP: 110/81  Pulse: 74  Resp: 18  SpO2: 100%    Physical Exam  LABORATORY DATA:  CBC    Component Value Date/Time   WBC 6.9 12/09/2021 1120   WBC 11.7 (H) 10/19/2021 0828   RBC 4.18 12/09/2021 1120   HGB 13.0 12/09/2021 1120   HGB 13.8 08/14/2021 1505  HCT 37.0 12/09/2021 1120   HCT 39.8 08/14/2021 1505   PLT 237 12/09/2021 1120   PLT 245 08/14/2021 1505   MCV 88.5 12/09/2021 1120   MCV 89 08/14/2021 1505   MCH 31.1 12/09/2021 1120   MCHC 35.1 12/09/2021 1120   RDW 12.5 12/09/2021 1120   RDW 12.9 08/14/2021 1505   LYMPHSABS 1.6 12/09/2021 1120   MONOABS 0.5 12/09/2021 1120   EOSABS 0.0 12/09/2021 1120   BASOSABS 0.0 12/09/2021 1120    CMP     Component Value Date/Time   NA 140 12/09/2021 1120   NA 141 03/03/2021 0956   K 3.7 12/09/2021 1120   CL 105 12/09/2021 1120   CO2 27 12/09/2021 1120   GLUCOSE 85 12/09/2021 1120   BUN 16 12/09/2021 1120   BUN 17 03/03/2021 0956   CREATININE 0.81 12/09/2021 1120   CALCIUM 10.1 12/09/2021 1120   PROT 7.4 12/09/2021 1120   ALBUMIN 4.5 12/09/2021 1120   AST 19 12/09/2021 1120   ALT 16 12/09/2021 1120   ALKPHOS 63 12/09/2021 1120   BILITOT 0.9 12/09/2021 1120   GFRNONAA >60 12/09/2021 1120       PENDING LABS:   RADIOGRAPHIC STUDIES:  No results found.   PATHOLOGY:     ASSESSMENT and THERAPY PLAN:   No problem-specific Assessment & Plan notes found for this encounter.   No orders of the defined types were placed in this encounter.   All questions were answered. The patient knows to call the clinic with any problems, questions or  concerns. We can certainly see the patient much sooner if necessary. This note was electronically signed. Scot Dock, NP 12/09/2021

## 2021-12-09 NOTE — Telephone Encounter (Signed)
Received call from pt with complaint of severe left arm bruising x1 week.  Pt denies recent injury or trauma.  States she was seen by orthopedist who did x-rays and showed no broken bones and stated pt was experiencing bursitis of the left elbow. Pt concerned with hx of anemia and currently on Eliquis and is requesting labs and [facility] visit for further evaluation.  Appt scheduled, pt verbalized understanding of appt date and time.

## 2021-12-09 NOTE — Progress Notes (Signed)
Left upper extremity venous duplex has been completed. Preliminary results can be found in CV Proc through chart review.  Results were given to [place] NP.  12/09/21 2:00 PM Sierra Perez RVT

## 2021-12-10 LAB — VON WILLEBRAND PANEL
Coagulation Factor VIII: 153 % — ABNORMAL HIGH (ref 56–140)
Ristocetin Co-factor, Plasma: 130 % (ref 50–200)
Von Willebrand Antigen, Plasma: 186 % (ref 50–200)

## 2021-12-10 LAB — COAG STUDIES INTERP REPORT

## 2021-12-11 ENCOUNTER — Encounter: Payer: Self-pay | Admitting: Hematology and Oncology

## 2021-12-11 NOTE — Assessment & Plan Note (Signed)
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  She is here for f/u and evaluation of her left arm ecchymosis and swelling.   Due to her h/o DVTs even on blood thinners, I placed orders for a left arm doppler.  She is very concerned that she may have developed an altered factor 8 gene since her father seemed to develop his abnormality when he was around her age.  I placed orders for a VWD panel for her to undergo should the doppler be negative.  She will continue the eliquis.  We will f/u with her on the results of her lab testing should she undergo it.  She will return to see Dr. Lindi Adie in 03/2022.  She knows to call for any concerns in the interim or if her arm worsens or doesn't improve.

## 2021-12-12 ENCOUNTER — Telehealth: Payer: Self-pay | Admitting: Pharmacist

## 2021-12-12 ENCOUNTER — Encounter: Payer: Self-pay | Admitting: Adult Health

## 2021-12-12 NOTE — Telephone Encounter (Signed)
Patient called stating she is having knee surgery on Dec 8. Wants to know when to hold her Mounjaro. She takes on Mondays. Advised her last dose should be Nov 27. She can resume after surgery either Sat Dec 9 or she can wait until Monday Dec 11.

## 2021-12-16 NOTE — Progress Notes (Signed)
Patient Care Team: Wenda Low, MD as PCP - General (Internal Medicine) Rockwell Germany, RN as Oncology Nurse Navigator Mauro Kaufmann, RN as Oncology Nurse Navigator Nicholas Lose, MD as Consulting Physician (Hematology and Oncology)  DIAGNOSIS: No diagnosis found.  SUMMARY OF ONCOLOGIC HISTORY: Oncology History  Malignant neoplasm of upper-outer quadrant of right breast in female, estrogen receptor negative ([place])  08/27/2012 Initial Diagnosis   Stage 1a IDC with DCIS (T2N0M0) : ER-, PR-, HER positive 3+, Ki67 20-25%   08/27/2012 Cancer Staging   Staging form: Breast, AJCC 8th Edition - Clinical stage from 08/27/2012: Stage IIA (cT2, cN0, cM0, G3, ER-, PR-, HER2+) - Signed by Nicholas Lose, MD on 11/17/2019   09/06/2012 Breast MRI   Right breast, lateral to the nipple, 10x14x52m mass with mildly irregular borders   09/10/2012 Echocardiogram   EF 55-60%    Chemotherapy   Neoadjuvant Taxotere/Carboplatin/Herceptin/Perjeta followed by adjuvant Herceptin maintenance   01/09/2013 Breast MRI   Complete pathological response to neoadjuvant treatment: right breast mass previously noted at 9:00 position no longer identified.   02/13/2013 Surgery   Right breast lumpectomy: benign parenchyma with some stromal fibrosis consistent with previous tumor site, focal area of atypical lobular hyperplasia, all lymph nodes negative.     Radiation Therapy   Adjuvant radiation   04/06/2013 Imaging   DEXA: T-score of -1.1, osteopenia    09/25/2016 Initial Biopsy   Columnar cell alteration with atypia, foreign body granuloma   05/10/2020 Relapse/Recurrence   Breast MRI detected new linear non-mass enhancement right breast 2.1 cm, non-mass enhancement centrally left breast spanning 10.5 cm, left breast nipple areolar complex enhancement: Biopsy right breast: Grade 2 invasive lobular cancer.  ER/PR positive HER-2 equivocal by IHC FISH pending, Ki-67 1% left breast biopsy anterior fibrocystic change,  posterior atypical lobular hyperplasia/LCIS   05/26/2020 Genetic Testing   Negative genetic testing on the CancerNext-Expanded+RNAinsight panel.  The [facility] includes sequencing and rearrangement analysis for the following 77 genes: AIP, ALK, APC*, ATM*, AXIN2, BAP1, BARD1, BLM, BMPR1A, BRCA1*, BRCA2*, BRIP1*, CDC73, CDH1*, CDK4, CDKN1B, CDKN2A, CHEK2*, CTNNA1, DICER1, FANCC, FH, FLCN, GALNT12, KIF1B, LZTR1, MAX, MEN1, MET, MLH1*, MSH2*, MSH3, MSH6*, MUTYH*, NBN, NF1*, NF2, NTHL1, PALB2*, PHOX2B, PMS2*, POT1, PRKAR1A, PTCH1, PTEN*, RAD51C*, RAD51D*, RB1, RECQL, RET, SDHA, SDHAF2, SDHB, SDHC, SDHD, SMAD4, SMARCA4, SMARCB1, SMARCE1, STK11, SUFU, TMEM127, TP53*, TSC1, TSC2, VHL and XRCC2 (sequencing and deletion/duplication); EGFR, EGLN1, HOXB13, KIT, MITF, PDGFRA, POLD1, and POLE (sequencing only); EPCAM and GREM1 (deletion/duplication only). DNA and RNA analyses performed for * genes. The report date is May 26, 2020.    07/08/2020 Surgery   Bilateral mastectomies with reconstruction: Right mastectomy: Grade 2 ILC 0.9 cm, ALH, margins negative, 0/1 lymph, left mastectomy: Benign    08/2020 -  Anti-estrogen oral therapy   adjuvant antiestrogen therapy with anastrozole 1 mg daily x7 years started July 2022      CHIEF COMPLIANT: Follow-up to discuss surgery/Factor v111  INTERVAL HISTORY: Inna Bonsall is a   ALLERGIES:  is allergic to succinylcholine, azithromycin, keflex [cephalexin], silver, sulfa antibiotics, and sulfasalazine.  MEDICATIONS:  Current Outpatient Medications  Medication Sig Dispense Refill   albuterol (VENTOLIN HFA) 108 (90 Base) MCG/ACT inhaler Inhale 1 puff into the lungs every 6 (six) hours as needed for wheezing or shortness of breath.     amLODipine (NORVASC) 5 MG tablet Take 5 mg by mouth daily.     anastrozole (ARIMIDEX) 1 MG tablet Take 1 tablet (1  mg total) by mouth daily. 90 tablet 3   CALCIUM CITRATE PO Take 2 tablets  by mouth daily. Zinc  Copper Vit d  Manganese Sodium     Cholecalciferol (VITAMIN D-3) 25 MCG (1000 UT) CAPS Take 1,000 Units by mouth daily.     DULoxetine (CYMBALTA) 30 MG capsule 30 mg daily.     DULoxetine (CYMBALTA) 60 MG capsule Take 60 mg by mouth daily.     ELIQUIS 5 MG TABS tablet TAKE 1 TABLET(5 MG) BY MOUTH TWICE DAILY 180 tablet 2   fluticasone (FLONASE) 50 MCG/ACT nasal spray Place 2 sprays into both nostrils daily.     gabapentin (NEURONTIN) 600 MG tablet Take 600 mg by mouth 2 (two) times daily.     ipratropium (ATROVENT) 0.03 % nasal spray Place 2 sprays into both nostrils 2 (two) times daily.     Lactobacillus Rhamnosus, GG, (CULTURELLE PO) Take 1 capsule by mouth daily.     levothyroxine (SYNTHROID) 50 MCG tablet Take 50 mcg by mouth every morning.     pantoprazole (PROTONIX) 40 MG tablet Take 1 tablet (40 mg total) by mouth daily.     rosuvastatin (CRESTOR) 5 MG tablet Take 1 tablet (5 mg total) by mouth daily. 90 tablet 3   tirzepatide (MOUNJARO) 10 MG/0.5ML Pen Inject 10 mg into the skin once a week. 2 mL 11   valACYclovir (VALTREX) 500 MG tablet Take 1 tablet (500 mg total) by mouth daily. Increase to bid x 3 days with symptoms 120 tablet 4   Current Facility-Administered Medications  Medication Dose Route Frequency Provider Last Rate Last Admin   triamcinolone acetonide (KENALOG) 10 MG/ML injection 10 mg  10 mg Other Once Bronson Ing, DPM        PHYSICAL EXAMINATION: ECOG PERFORMANCE STATUS: {CHL ONC ECOG [id number]}  There were no vitals filed for this visit. There were no vitals filed for this visit.  BREAST:*** No palpable masses or nodules in either right or left breasts. No palpable axillary supraclavicular or infraclavicular adenopathy no breast tenderness or nipple discharge. (exam performed in the presence of a chaperone)  LABORATORY DATA:  I have reviewed the data as listed    Latest Ref Rng & Units 12/09/2021   11:20 AM 10/19/2021    8:28  AM 03/04/2021    8:04 AM  CMP  Glucose 70 - 99 mg/dL 85  124    BUN 8 - 23 mg/dL 16  21    Creatinine 0.44 - 1.00 mg/dL 0.81  0.73  0.60   Sodium 135 - 145 mmol/L 140  137    Potassium 3.5 - 5.1 mmol/L 3.7  4.1    Chloride 98 - 111 mmol/L 105  107    CO2 22 - 32 mmol/L 27  23    Calcium 8.9 - 10.3 mg/dL 10.1  10.0    Total Protein 6.5 - 8.1 g/dL 7.4     Total Bilirubin 0.3 - 1.2 mg/dL 0.9     Alkaline Phos 38 - 126 U/L 63     AST 15 - 41 U/L 19     ALT 0 - 44 U/L 16       Lab Results  Component Value Date   WBC 6.9 12/09/2021   HGB 13.0 12/09/2021   HCT 37.0 12/09/2021   MCV 88.5 12/09/2021   PLT 237 12/09/2021   NEUTROABS 4.7 12/09/2021    ASSESSMENT & PLAN:  No problem-specific Assessment & Plan notes found for this encounter.  No orders of the defined types were placed in this encounter.  The patient has a good understanding of the overall plan. she agrees with it. she will call with any problems that may develop before the next visit here. Total time spent: 30 mins including face to face time and time spent for planning, charting and co-ordination of care   Suzzette Righter, [place] 12/16/21    I Gardiner Coins am scribing for Dr. Lindi Adie  ***

## 2021-12-16 NOTE — Assessment & Plan Note (Signed)
08/27/2012: Right breast: Stage Ia IDC with DCIS T2 N0 M0 ER negative PR negative, HER-2 positive, Ki-67 20 to 25% status post neoadjuvant TCHP: Complete pathologic response, adjuvant radiation   05/10/2020: Recurrence/relapse: Right breast: 2.1 cm non-mass enhancement: Biopsy grade 2 invasive lobular cancer ER/PR positive HER-2 equivocal by IHC FISH pending, Ki-67 1% Left breast: 10.5 cm non-mass enhancement anterior biopsy fibrocystic change, posterior biopsy ALH/LCIS   Treatment plan: 1.   07/08/2020 Bilateral mastectomies with reconstruction: Right mastectomy: Grade 2 ILC 0.9 cm, ALH, margins negative, 0/1 lymph, left mastectomy: Benign 2. adjuvant antiestrogen therapy with anastrozole 1 mg daily x7 years started July 2022 Genetic testing ------------------------------------------------------------ Current treatment: Adjuvant antiestrogen therapy with anastrozole 1 mg daily x7 years started July 2022 Anastrozole Toxicities: Denies any major side effects to anastrozole therapy.   We might consider doing a breast MRI every 3 years for implant integrity.   Breast examination: 01/23/2021: Benign   Iron deficiency anemia:Secondary to right hip replacement surgery November 19, 2020.  Her hemoglobin went from 10.4 down to 7.7.  Received IV iron 01/25/2021   Lab review:  03/07/2021: Hemoglobin 12.7, MCV 83.3, iron saturation 12%, ferritin 93 12/09/21: vWF: Normal, Hb: 13, MCV 88.5, Ferritin 104, Iron Sat: 18%, Cr: 0.81    Return to clinic in 1 year for follow-up

## 2021-12-17 ENCOUNTER — Inpatient Hospital Stay: Payer: 59

## 2021-12-17 ENCOUNTER — Other Ambulatory Visit: Payer: Self-pay

## 2021-12-17 ENCOUNTER — Inpatient Hospital Stay (HOSPITAL_BASED_OUTPATIENT_CLINIC_OR_DEPARTMENT_OTHER): Payer: 59 | Admitting: Hematology and Oncology

## 2021-12-17 ENCOUNTER — Telehealth: Payer: Self-pay | Admitting: Hematology and Oncology

## 2021-12-17 VITALS — BP 122/63 | HR 89 | Temp 97.7°F | Resp 18 | Ht 67.0 in | Wt 200.2 lb

## 2021-12-17 DIAGNOSIS — M7989 Other specified soft tissue disorders: Secondary | ICD-10-CM

## 2021-12-17 DIAGNOSIS — Z171 Estrogen receptor negative status [ER-]: Secondary | ICD-10-CM | POA: Diagnosis not present

## 2021-12-17 DIAGNOSIS — C50411 Malignant neoplasm of upper-outer quadrant of right female breast: Secondary | ICD-10-CM

## 2021-12-17 LAB — PROTIME-INR
INR: 1.2 (ref 0.8–1.2)
Prothrombin Time: 15.1 seconds (ref 11.4–15.2)

## 2021-12-17 LAB — APTT: aPTT: 34 seconds (ref 24–36)

## 2021-12-17 MED ORDER — ENOXAPARIN SODIUM 80 MG/0.8ML IJ SOSY: 80.0000 mg | PREFILLED_SYRINGE | Freq: Two times a day (BID) | INTRAMUSCULAR | 0 refills | Status: DC

## 2021-12-17 NOTE — Telephone Encounter (Signed)
Scheduled appointment per 11/15 los. Left voicemail.

## 2021-12-18 ENCOUNTER — Ambulatory Visit (HOSPITAL_COMMUNITY)
Admission: RE | Admit: 2021-12-18 | Discharge: 2021-12-18 | Disposition: A | Discharge status: Home / Self Care | Payer: 59 | Source: Ambulatory Visit | Attending: Hematology and Oncology | Admitting: Hematology and Oncology

## 2021-12-18 DIAGNOSIS — M7989 Other specified soft tissue disorders: Secondary | ICD-10-CM | POA: Insufficient documentation

## 2021-12-19 ENCOUNTER — Telehealth: Payer: Self-pay

## 2021-12-19 ENCOUNTER — Encounter: Payer: Self-pay | Admitting: Hematology and Oncology

## 2021-12-19 ENCOUNTER — Encounter: Payer: Self-pay | Admitting: Physician Assistant

## 2021-12-19 ENCOUNTER — Ambulatory Visit (INDEPENDENT_AMBULATORY_CARE_PROVIDER_SITE_OTHER): Payer: 59 | Admitting: Physician Assistant

## 2021-12-19 DIAGNOSIS — M1711 Unilateral primary osteoarthritis, right knee: Secondary | ICD-10-CM

## 2021-12-19 MED ORDER — LIDOCAINE HCL 1 % IJ SOLN
4.0000 mL | INTRAMUSCULAR | Status: AC | PRN
  Administered 2021-12-19: 4 mL

## 2021-12-19 NOTE — Telephone Encounter (Signed)
Patient having TKA 01/09/22 and she is on the schedule to see [facility] this morning and wants aspiration Is that ok?

## 2021-12-19 NOTE — Progress Notes (Signed)
Office Visit Note   Patient: Sierra Perez           Date of Birth: 1960/08/03           MRN: [id number] Visit Date: 12/19/2021              Requested by: Nicholas Lose, MD [place],  [place] [id number] PCP: Nicholas Lose, MD  Chief Complaint  Patient presents with  . Right Knee - Edema      HPI: [name] is a pleasant 61 year old woman who has been seen in the past.  She has a history of right knee arthritis and is scheduled with right knee replacement with Dr. Ninfa Linden in about 3 weeks.  She has had multiple fusions of her right knee.  She comes in today requesting aspiration of her right knee.  She is going on vacation and her knee gets very sore and stiff.  Assessment & Plan: Visit Diagnoses: Osteoarthritis right knee  Plan: I discussed with the patient that I would be able to aspirate her knee today but that I would not be able to put any cortisone in it because of the proximity of her surgery.  She did relate to me that previous aspiration that was done in September she said never "felt" she did end up in the emergency room the following day with significant pain in that knee.  Fluid was sent did not show any bacteria she was not sure she treated with any antibiotics.  This was thought to be inflammatory may be secondary to pseudogout though she did not have any crystals.  Despite this she like to go forward with another aspiration.  Follow-Up Instructions: Return if symptoms worsen or fail to improve.   Ortho Exam  Patient is alert, oriented, no adenopathy, well-dressed, normal affect, normal respiratory effort. Right knee moderate effusion no redness no erythema compartments are soft and nontender  Imaging: No results found. No images are attached to the encounter.  Labs: Lab Results  Component Value Date   HGBA1C 5.5 09/23/2021   HGBA1C 7.0 (H) 11/15/2020   HGBA1C 7.1 (H) 07/02/2020   REPTSTATUS 10/22/2021 FINAL 10/19/2021   GRAMSTAIN   10/19/2021    FEW WBC PRESENT, PREDOMINANTLY PMN NO ORGANISMS SEEN    CULT  10/19/2021    NO GROWTH 3 DAYS Performed at [facility], [place] [address], [place], [place] [address]      Lab Results  Component Value Date   ALBUMIN 4.5 12/09/2021   ALBUMIN 4.3 11/16/2020    Lab Results  Component Value Date   MG 1.9 11/16/2020   No results found for: "VD25OH"  No results found for: "PREALBUMIN"    Latest Ref Rng & Units 12/09/2021   11:20 AM 10/19/2021    8:28 AM 08/14/2021    3:05 PM  CBC EXTENDED  WBC 4.0 - 10.5 K/uL 6.9  11.7  9.6   RBC 3.87 - 5.11 MIL/uL 4.18  4.19  4.49   Hemoglobin 12.0 - 15.0 g/dL 13.0  13.1  13.8   HCT 36.0 - 46.0 % 37.0  38.5  39.8   Platelets 150 - 400 K/uL 237  234  245   NEUT# 1.7 - 7.7 K/uL 4.7  9.6    Lymph# 0.7 - 4.0 K/uL 1.6  1.1       There is no height or weight on file to calculate BMI.  Orders:  No orders of the defined types were placed in this  encounter.  No orders of the defined types were placed in this encounter.    Procedures: Large Joint Inj: R knee on 12/19/2021 10:21 AM Indications: pain and diagnostic evaluation Details: 25 G 1.5 in needle, superolateral approach  Arthrogram: No  Medications: 4 mL lidocaine 1 % Aspirate: 35 mL blood-tinged Outcome: tolerated well, no immediate complications  After obtaining verbal consent the superior lateral aspect of her knee was prepped 2 times with alcohol and Betadine.  4 cc of lidocaine plain was injected.  35 cc of blood-tinged fluid was aspirated.  Band-Aid was applied.  No significant bleeding.  Ace wrap applied patient reported feeling better after the procedure Procedure, treatment alternatives, risks and benefits explained, specific risks discussed. Consent was given by the patient. Immediately prior to procedure a time out was called to verify the correct patient, procedure, equipment, support staff and site/side marked as required.    Clinical Data: No additional  findings.  ROS:  All other systems negative, except as noted in the HPI. Review of Systems  Objective: Vital Signs: There were no vitals taken for this visit.  Specialty Comments:  No specialty comments available.  PMFS History: Patient Active Problem List   Diagnosis Date Noted  . Obesity (BMI 30-39.9) 06/23/2021  . Iron deficiency anemia 01/23/2021  . Status post right hip replacement 12/03/2020  . Status post total replacement of right hip 11/19/2020  . Breast cancer ([place]) 07/08/2020  . 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  . H/O total hysterectomy 06/11/2020  . Genetic testing 05/27/2020  . Chronic superficial gastritis 05/27/2020  . Fibromyalgia 05/27/2020  . GERD (gastroesophageal reflux disease) 05/27/2020  . Prediabetes 05/27/2020  . Complex regional pain syndrome I, unspecified 05/27/2020  . Atypical lobular hyperplasia [facility]) of left breast 05/23/2020  . History of therapeutic radiation 05/23/2020  . Family history of breast cancer   . Family history of colon cancer   . Personal history of malignant neoplasm of breast   . Unilateral primary osteoarthritis, right knee 02/26/2020  . Unilateral primary osteoarthritis, 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  . 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 DVT (deep venous thrombosis) ([employer]) 05/04/2018  . Malignant neoplasm of upper-outer quadrant of right breast in female, estrogen receptor negative ([place]) 05/03/2018  . Obstructive sleep apnea (adult) (pediatric) 09/14/2017  . Adenopathy 07/15/2017  . B12 deficiency 07/15/2017  . Disorder of tympanic membrane of right ear 07/15/2017  . Recurrent UTI 03/09/2017  . Bladder pain 02/25/2017  . Proteinuria 02/25/2017  . Hematoma of thigh, left, subsequent encounter 08/16/2016  . At risk for obstructive sleep apnea 07/01/2016  . Suspected sleep apnea 07/01/2016  . Arthritis of left knee 05/26/2016  . Localized primary osteoarthritis of lower leg, left 05/26/2016  . Clotting disorder ([place]) 05/22/2016  .  Chronic anticoagulation 01/06/2016  . Right-sided epistaxis 01/06/2016  . Urinary tract infection, site not specified 08/12/2015  . Abnormal Pap smear of vagina 05/06/2015  . Follow-up examination, following other surgery 12/14/2013  . Pelvic and perineal pain 11/28/2013  . Anxiety and depression 11/16/2013  . H/O bariatric surgery 11/16/2013  . Atrophic vaginitis 11/13/2013  . Tear of lateral cartilage or meniscus of knee, current 04/14/2013  . Allergic rhinitis 01/16/2013  . Female genital symptoms 11/10/2012  . Symptomatic menopausal or female climacteric states 11/10/2012  . Disequilibrium 06/09/2012  . Morbid obesity ([place]) 02/09/2012  . Vitamin D deficiency 09/13/2008  . Preop examination 04/20/2008  . Hypothyroidism 05/20/2007   Past Medical History:  Diagnosis Date  . Anemia   .  Anxiety   . Arthritis   . Asthma   . Breast cancer ([place]) 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, her-2 positive, ER/PR negative  . Clotting disorder ([place])    testing was negative, h/o DVT while on treatment  . Colon polyps   . Complex regional pain syndrome i of right lower limb    RDS  . Cystitis   . Diabetes mellitus without complication ([place])   . DVT (deep venous thrombosis) ([place])    started in 2015, multiple  . Family history of breast cancer   . Family history of colon cancer   . Fibroid   . Fibromyalgia   . GERD (gastroesophageal reflux disease)   . Heart murmur   . HLD (hyperlipidemia)   . HPV in female   . 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 Hypertension   . Hypothyroidism   . IBS (irritable bowel syndrome)   . Mitral valve prolapse   . Peptic ulcer   . Personal history of malignant neoplasm of breast   . Sleep apnea treated with continuous positive airway pressure (CPAP)     Family History  Problem Relation Age of Onset  . Hypertension Mother   . Skin cancer Mother   . Colon polyps Mother   . Valvular heart disease Mother   . Kidney disease Mother   .  Diabetes Father   . Kidney disease Father   . Dementia Father   . Factor VIII deficiency Father   . Colon polyps Brother   . Bladder Cancer Maternal Grandmother        dx in late 69s; smoker  . Stroke Maternal Grandmother   . Diabetes Maternal Grandmother   . Lung cancer Maternal Grandfather   . Colon cancer Maternal Grandfather 72       origin  . Thyroid cancer Maternal Grandfather        dx in his 5s  . Breast cancer Paternal Grandmother        dx in 69s  . Prostate cancer Other        MGMs brother    Past Surgical History:  Procedure Laterality Date  . ABDOMINAL HYSTERECTOMY  2010   fibroids, h/o abnormal pap smears  . AXILLARY SENTINEL NODE BIOPSY Right 07/08/2020   Procedure: RIGHT AXILLARY SENTINEL NODE BIOPSY;  Surgeon: Rolm Bookbinder, MD;  Location: [facility];  Service: General;  Laterality: Right;  . BREAST RECONSTRUCTION WITH PLACEMENT OF TISSUE EXPANDER AND FLEX HD (ACELLULAR HYDRATED DERMIS) Bilateral 07/08/2020   Procedure: BILATERAL BREAST RECONSTRUCTION WITH  FLEX HD (ACELLULAR HYDRATED DERMIS),DIRECT IMPLANT RECONSTRUCTION;  Surgeon: Cindra Presume, MD;  Location: [facility];  Service: Plastics;  Laterality: Bilateral;  . BREAST SURGERY Right 2015   right lumpectomy with reduction and lift  . COLONOSCOPY    . DENTAL SURGERY    . ESOPHAGOGASTRODUODENOSCOPY    . [place] RESECTION  2014  . LAPAROSCOPIC OOPHERECTOMY Bilateral 2016  . REPLACEMENT TOTAL KNEE Left 2018   also had 3 prior arthroscopies  . TOE SURGERY Right    dislocated toe  . TOTAL HIP ARTHROPLASTY Right 11/19/2020   Procedure: RIGHT TOTAL HIP ARTHROPLASTY ANTERIOR APPROACH;  Surgeon: Mcarthur Rossetti, MD;  Location: [facility];  Service: Orthopedics;  Laterality: Right;  . TOTAL MASTECTOMY Bilateral 07/08/2020   Procedure: BILATERAL TOTAL MASTECTOMY;  Surgeon: Rolm Bookbinder, MD;  Location: [facility];  Service: General;  Laterality: Bilateral;  . VENA CAVA FILTER PLACEMENT  2017  Social History   Occupational History  . Occupation: [employer]  Tobacco Use  . Smoking status: Never  . Smokeless tobacco: Never  Vaping Use  . Vaping Use: Never used  Substance and Sexual Activity  . Alcohol use: Yes    Comment: occ  . Drug use: Not Currently    Types: Marijuana  . Sexual activity: Not Currently    Birth control/protection: Surgical    Comment: Hysterectomy

## 2021-12-22 ENCOUNTER — Telehealth: Payer: Self-pay

## 2021-12-22 ENCOUNTER — Encounter: Payer: Self-pay | Admitting: Orthopaedic Surgery

## 2021-12-22 ENCOUNTER — Other Ambulatory Visit: Payer: Self-pay | Admitting: Orthopaedic Surgery

## 2021-12-22 MED ORDER — OXYCODONE HCL 5 MG PO TABS: 5.0000 mg | ORAL_TABLET | Freq: Four times a day (QID) | ORAL | 0 refills | Status: DC | PRN

## 2021-12-22 NOTE — Telephone Encounter (Signed)
-----   Message from Gardenia Phlegm, NP sent at 12/22/2021  8:47 AM EST ----- Please call patient and ask her if the area on her arm has improved at all?   ----- Message ----- From: Interface, Rad Results In Sent: 12/19/2021   1:09 PM EST To: Gardenia Phlegm, NP

## 2021-12-22 NOTE — Telephone Encounter (Signed)
Called to speak with pt in regards to LUE bruise/swelling. Pt states bruise has essentially diminished, but she continues to experience "slight swelling" to her left elbow. Pt denies pain/redness/heat, but asks what the swelling could be from. Advised pt I would discuss this with NP and will be in touch. She verbalized thanks and understanding.

## 2021-12-29 ENCOUNTER — Other Ambulatory Visit: Payer: Self-pay | Admitting: Physician Assistant

## 2021-12-29 DIAGNOSIS — M1711 Unilateral primary osteoarthritis, right knee: Secondary | ICD-10-CM

## 2022-01-01 ENCOUNTER — Other Ambulatory Visit: Payer: Self-pay | Admitting: Orthopaedic Surgery

## 2022-01-01 MED ORDER — OXYCODONE HCL 5 MG PO TABS: 5.0000 mg | ORAL_TABLET | Freq: Four times a day (QID) | ORAL | 0 refills | Status: DC | PRN

## 2022-01-02 NOTE — Progress Notes (Signed)
COVID Vaccine Completed: yes  Date of COVID positive in last 90 days:  PCP - Nicholas Lose, MD Cardiologist -   Chest x-ray -  EKG - 02/05/21 Epic Stress Test -  ECHO - 03/07/21 Epic Cardiac Cath -  Pacemaker/ICD device last checked: Spinal Cord Stimulator:  Bowel Prep -   Sleep Study -  CPAP -   Fasting Blood Sugar - pre DM Checks Blood Sugar _____ times a day  Last dose of GLP1 agonist-  Mounjaro GLP1 instructions:     Last dose of SGLT-2 inhibitors-  N/A SGLT-2 instructions: N/A   Blood Thinner Instructions: Eliquis, Lovenox bridge  Aspirin Instructions: Last Dose:  Activity level:  Can go up a flight of stairs and perform activities of daily living without stopping and without symptoms of chest pain or shortness of breath.  Able to exercise without symptoms  Unable to go up a flight of stairs without symptoms of     Anesthesia review: DVT, HTN, OSA, pre DM, MVP  Patient denies shortness of breath, fever, cough and chest pain at PAT appointment  Patient verbalized understanding of instructions that were given to them at the PAT appointment. Patient was also instructed that they will need to review over the PAT instructions again at home before surgery.

## 2022-01-05 ENCOUNTER — Encounter: Payer: Self-pay | Admitting: Hematology and Oncology

## 2022-01-05 NOTE — Patient Instructions (Signed)
SURGICAL WAITING ROOM VISITATION Patients having surgery or a procedure may have no more than 2 support people in the waiting area - these visitors may rotate.   Children under the age of 35 must have an adult with them who is not the patient. If the patient needs to stay at the hospital during part of their recovery, the visitor guidelines for inpatient rooms apply. Pre-op nurse will coordinate an appropriate time for 1 support person to accompany patient in pre-op.  This support person may not rotate.    Please refer to the [facility] website for the visitor guidelines for Inpatients (after your surgery is over and you are in a regular room).     Your procedure is scheduled on: 01/09/22   Report to [facility] Main Entrance    Report to admitting at 8:55 AM   Call this number if you have problems the morning of surgery [phone]   Do not eat food :After Midnight.   After Midnight you may have the following liquids until 8:25 AM DAY OF SURGERY  Water Non-Citrus Juices (without pulp, NO RED) Carbonated Beverages Black Coffee (NO MILK/CREAM OR CREAMERS, sugar ok)  Clear Tea (NO MILK/CREAM OR CREAMERS, sugar ok) regular and decaf                             Plain Jell-O (NO RED)                                           Fruit ices (not with fruit pulp, NO RED)                                     Popsicles (NO RED)                                                               Sports drinks like Gatorade (NO RED)               The day of surgery:  Drink ONE (1) Pre-Surgery G2 at 8:25 AM the morning of surgery. Drink in one sitting. Do not sip.  This drink was given to you during your hospital  pre-op appointment visit. Nothing else to drink after completing the  Pre-Surgery G2.          If you have questions, please contact your surgeon's office.   FOLLOW BOWEL PREP AND ANY ADDITIONAL PRE OP INSTRUCTIONS YOU RECEIVED FROM YOUR SURGEON'S OFFICE!!!     Oral Hygiene is  also important to reduce your risk of infection.                                    Remember - BRUSH YOUR TEETH THE MORNING OF SURGERY WITH YOUR REGULAR TOOTHPASTE  DENTURES WILL BE REMOVED PRIOR TO SURGERY PLEASE DO NOT APPLY "Poly grip" OR ADHESIVES!!!   Take these medicines the morning of surgery with A SIP OF WATER: Inhalers, Amlodipine, Bupropion, Duloxetine, Gabapentin, Levothyroxine, Oxycodone,  Pantoprazole, Rosuvastatin   These are anesthesia recommendations for holding your anticoagulants.  Please contact your prescribing physician to confirm IF it is safe to hold your anticoagulants for this length of time.   Eliquis Apixaban   72 hours   Xarelto Rivaroxaban   72 hours  Plavix Clopidogrel   120 hours  Pletal Cilostazol   120 hours    DO NOT TAKE ANY ORAL DIABETIC MEDICATIONS DAY OF YOUR SURGERY  How to Manage Your Diabetes Before and After Surgery  Why is it important to control my blood sugar before and after surgery? Improving blood sugar levels before and after surgery helps healing and can limit problems. A way of improving blood sugar control is eating a healthy diet by:  Eating less sugar and carbohydrates  Increasing activity/exercise  Talking with your doctor about reaching your blood sugar goals High blood sugars (greater than 180 mg/dL) can raise your risk of infections and slow your recovery, so you will need to focus on controlling your diabetes during the weeks before surgery. Make sure that the doctor who takes care of your diabetes knows about your planned surgery including the date and location.  How do I manage my blood sugar before surgery? Check your blood sugar at least 4 times a day, starting 2 days before surgery, to make sure that the level is not too high or low. Check your blood sugar the morning of your surgery when you wake up and every 2 hours until you get to the Short Stay unit. If your blood sugar is less than 70 mg/dL, you will need to treat  for low blood sugar: Do not take insulin. Treat a low blood sugar (less than 70 mg/dL) with  cup of clear juice (cranberry or apple), 4 glucose tablets, OR glucose gel. Recheck blood sugar in 15 minutes after treatment (to make sure it is greater than 70 mg/dL). If your blood sugar is not greater than 70 mg/dL on recheck, call [phone] for further instructions. Report your blood sugar to the short stay nurse when you get to Short Stay.  If you are admitted to the hospital after surgery: Your blood sugar will be checked by the staff and you will probably be given insulin after surgery (instead of oral diabetes medicines) to make sure you have good blood sugar levels. The goal for blood sugar control after surgery is 80-180 mg/dL.   WHAT DO I DO ABOUT MY DIABETES MEDICATION?  Do not take oral diabetes medicines (pills) the morning of surgery.  Do not take [facility] 01/05/22. May resume as prescribed after surgery.  DO NOT TAKE THE FOLLOWING 7 DAYS PRIOR TO SURGERY: Ozempic, Wegovy, Rybelsus (Semaglutide), Byetta (exenatide), Bydureon (exenatide ER), Victoza, Saxenda (liraglutide), or Trulicity (dulaglutide) Mounjaro (Tirzepatide) Adlyxin (Lixisenatide), Polyethylene Glycol Loxenatide.  Reviewed and Endorsed by [facility] Patient Education Committee, August 2015  Bring CPAP mask and tubing day of surgery.                              You may not have any metal on your body including hair pins, jewelry, and body piercing             Do not wear make-up, lotions, powders, perfumes, or deodorant  Do not wear nail polish including gel and S&S, artificial/acrylic nails, or any other type of covering on natural nails including finger and toenails. If you have artificial nails, gel coating, etc. that  needs to be removed by a nail salon please have this removed prior to surgery or surgery may need to be canceled/ delayed if the surgeon/ anesthesia feels like they are unable to be safely  monitored.   Do not shave  48 hours prior to surgery.    Do not bring valuables to the hospital. [place].   Bring small overnight bag day of surgery.   DO NOT [place]. PHARMACY WILL DISPENSE MEDICATIONS LISTED ON YOUR MEDICATION LIST TO YOU DURING YOUR ADMISSION [facility]!   Special Instructions: Bring a copy of your healthcare power of attorney and living will documents the day of surgery if you haven't scanned them before.              Please read over the following fact sheets you were given: IF [place] [phone] Schneiders   If you received a COVID test during your pre-op visit  it is requested that you wear a mask when out in public, stay away from anyone that may not be feeling well and notify your surgeon if you develop symptoms. If you test positive for Covid or have been in contact with anyone that has tested positive in the last 10 days please notify you surgeon.    [facility] - Preparing for Surgery Before surgery, you can play an important role.  Because skin is not sterile, your skin needs to be as free of germs as possible.  You can reduce the number of germs on your skin by washing with CHG (chlorahexidine gluconate) soap before surgery.  CHG is an antiseptic cleaner which kills germs and bonds with the skin to continue killing germs even after washing. Please DO NOT use if you have an allergy to CHG or antibacterial soaps.  If your skin becomes reddened/irritated stop using the CHG and inform your nurse when you arrive at Short Stay. Do not shave (including legs and underarms) for at least 48 hours prior to the first CHG shower.  You may shave your face/neck.  Please follow these instructions carefully:  1.  Shower with CHG Soap the night before surgery and the  morning of surgery.  2.  If you choose to wash your hair, wash your  hair first as usual with your normal  shampoo.  3.  After you shampoo, rinse your hair and body thoroughly to remove the shampoo.                             4.  Use CHG as you would any other liquid soap.  You can apply chg directly to the skin and wash.  Gently with a scrungie or clean washcloth.  5.  Apply the CHG Soap to your body ONLY FROM THE NECK DOWN.   Do   not use on face/ open                           Wound or open sores. Avoid contact with eyes, ears mouth and   genitals (private parts).                       Wash face,  Genitals (private parts) with your normal soap.  6.  Wash thoroughly, paying special attention to the area where your    surgery  will be performed.  7.  Thoroughly rinse your body with warm water from the neck down.  8.  DO NOT shower/wash with your normal soap after using and rinsing off the CHG Soap.                9.  Pat yourself dry with a clean towel.            10.  Wear clean pajamas.            11.  Place clean sheets on your bed the night of your first shower and do not  sleep with pets. Day of Surgery : Do not apply any lotions/deodorants the morning of surgery.  Please wear clean clothes to the hospital/surgery center.  FAILURE TO FOLLOW THESE INSTRUCTIONS MAY RESULT IN THE CANCELLATION OF YOUR SURGERY  PATIENT SIGNATURE_________________________________  NURSE SIGNATURE__________________________________  ________________________________________________________________________  Adam Phenix  An incentive spirometer is a tool that can help keep your lungs clear and active. This tool measures how well you are filling your lungs with each breath. Taking long deep breaths may help reverse or decrease the chance of developing breathing (pulmonary) problems (especially infection) following: A long period of time when you are unable to move or be active. BEFORE THE PROCEDURE  If the spirometer includes an indicator to show your best  effort, your nurse or respiratory therapist will set it to a desired goal. If possible, sit up straight or lean slightly forward. Try not to slouch. Hold the incentive spirometer in an upright position. INSTRUCTIONS FOR USE  Sit on the edge of your bed if possible, or sit up as far as you can in bed or on a chair. Hold the incentive spirometer in an upright position. Breathe out normally. Place the mouthpiece in your mouth and seal your lips tightly around it. Breathe in slowly and as deeply as possible, raising the piston or the ball toward the top of the column. Hold your breath for 3-5 seconds or for as long as possible. Allow the piston or ball to fall to the bottom of the column. Remove the mouthpiece from your mouth and breathe out normally. Rest for a few seconds and repeat Steps 1 through 7 at least 10 times every 1-2 hours when you are awake. Take your time and take a few normal breaths between deep breaths. The spirometer may include an indicator to show your best effort. Use the indicator as a goal to work toward during each repetition. After each set of 10 deep breaths, practice coughing to be sure your lungs are clear. If you have an incision (the cut made at the time of surgery), support your incision when coughing by placing a pillow or rolled up towels firmly against it. Once you are able to get out of bed, walk around indoors and cough well. You may stop using the incentive spirometer when instructed by your caregiver.  RISKS AND COMPLICATIONS Take your time so you do not get dizzy or light-headed. If you are in pain, you may need to take or ask for pain medication before doing incentive spirometry. It is harder to take a deep breath if you are having pain. AFTER USE Rest and breathe slowly and easily. It can be helpful to keep track of a log of your progress. Your caregiver can provide you with a simple table to help with this. If you  are using the spirometer at home, follow  these instructions: [place] IF:  You are having difficultly using the spirometer. You have trouble using the spirometer as often as instructed. Your pain medication is not giving enough relief while using the spirometer. You develop fever of 100.5 F (38.1 C) or higher. SEEK IMMEDIATE MEDICAL CARE IF:  You cough up bloody sputum that had not been present before. You develop fever of 102 F (38.9 C) or greater. You develop worsening pain at or near the incision site. MAKE SURE YOU:  Understand these instructions. Will watch your condition. Will get help right away if you are not doing well or get worse. Document Released: 06/01/2006 Document Revised: 04/13/2011 Document Reviewed: 08/02/2006 Exit[employer] Patient Information 2014 [employer], [place].   ________________________________________________________________________   WHAT IS A BLOOD TRANSFUSION? Blood Transfusion Information  A transfusion is the replacement of blood or some of its parts. Blood is made up of multiple cells which provide different functions. Red blood cells carry oxygen and are used for blood loss replacement. White blood cells fight against infection. Platelets control bleeding. Plasma helps clot blood. Other blood products are available for specialized needs, such as hemophilia or other clotting disorders. BEFORE THE TRANSFUSION  Who gives blood for transfusions?  Healthy volunteers who are fully evaluated to make sure their blood is safe. This is blood bank blood. Transfusion therapy is the safest it has ever been in the practice of medicine. Before blood is taken from a donor, a complete history is taken to make sure that person has no history of diseases nor engages in risky social behavior (examples are intravenous drug use or sexual activity with multiple partners). The donor's travel history is screened to minimize risk of transmitting infections, such as malaria. The donated blood is tested for signs  of infectious diseases, such as HIV and hepatitis. The blood is then tested to be sure it is compatible with you in order to minimize the chance of a transfusion reaction. If you or a relative donates blood, this is often done in anticipation of surgery and is not appropriate for emergency situations. It takes many days to process the donated blood. RISKS AND COMPLICATIONS Although transfusion therapy is very safe and saves many lives, the main dangers of transfusion include:  Getting an infectious disease. Developing a transfusion reaction. This is an allergic reaction to something in the blood you were given. Every precaution is taken to prevent this. The decision to have a blood transfusion has been considered carefully by your caregiver before blood is given. Blood is not given unless the benefits outweigh the risks. AFTER THE TRANSFUSION Right after receiving a blood transfusion, you will usually feel much better and more energetic. This is especially true if your red blood cells have gotten low (anemic). The transfusion raises the level of the red blood cells which carry oxygen, and this usually causes an energy increase. The nurse administering the transfusion will monitor you carefully for complications. HOME CARE INSTRUCTIONS  No special instructions are needed after a transfusion. You may find your energy is better. Speak with your caregiver about any limitations on activity for underlying diseases you may have. SEEK MEDICAL CARE IF:  Your condition is not improving after your transfusion. You develop redness or irritation at the intravenous (IV) site. SEEK IMMEDIATE MEDICAL CARE IF:  Any of the following symptoms occur over the next 12 hours: Shaking chills. You have a temperature by mouth above 102 F (38.9 C), not  controlled by medicine. Chest, back, or muscle pain. People around you feel you are not acting correctly or are confused. Shortness of breath or difficulty  breathing. Dizziness and fainting. You get a rash or develop hives. You have a decrease in urine output. Your urine turns a dark color or changes to pink, red, or brown. Any of the following symptoms occur over the next 10 days: You have a temperature by mouth above 102 F (38.9 C), not controlled by medicine. Shortness of breath. Weakness after normal activity. The white part of the eye turns yellow (jaundice). You have a decrease in the amount of urine or are urinating less often. Your urine turns a dark color or changes to pink, red, or brown. Document Released: 01/17/2000 Document Revised: 04/13/2011 Document Reviewed: 09/05/2007 [facility] Patient Information 2014 [place], [place].  _______________________________________________________________________

## 2022-01-06 ENCOUNTER — Encounter (HOSPITAL_COMMUNITY): Payer: Self-pay

## 2022-01-06 ENCOUNTER — Encounter (HOSPITAL_COMMUNITY)
Admission: RE | Admit: 2022-01-06 | Discharge: 2022-01-06 | Disposition: A | Discharge status: Home / Self Care | Payer: 59 | Source: Ambulatory Visit | Attending: Orthopaedic Surgery | Admitting: Orthopaedic Surgery

## 2022-01-06 ENCOUNTER — Encounter: Payer: Self-pay | Admitting: Hematology and Oncology

## 2022-01-06 ENCOUNTER — Encounter: Payer: Self-pay | Admitting: Orthopaedic Surgery

## 2022-01-06 VITALS — BP 128/73 | HR 79 | Temp 98.1°F | Resp 12 | Ht 68.0 in | Wt 188.0 lb

## 2022-01-06 DIAGNOSIS — J45909 Unspecified asthma, uncomplicated: Secondary | ICD-10-CM | POA: Insufficient documentation

## 2022-01-06 DIAGNOSIS — E119 Type 2 diabetes mellitus without complications: Secondary | ICD-10-CM | POA: Insufficient documentation

## 2022-01-06 DIAGNOSIS — Z01812 Encounter for preprocedural laboratory examination: Secondary | ICD-10-CM | POA: Diagnosis present

## 2022-01-06 DIAGNOSIS — R7303 Prediabetes: Secondary | ICD-10-CM

## 2022-01-06 DIAGNOSIS — Z86718 Personal history of other venous thrombosis and embolism: Secondary | ICD-10-CM | POA: Diagnosis not present

## 2022-01-06 DIAGNOSIS — M1711 Unilateral primary osteoarthritis, right knee: Secondary | ICD-10-CM | POA: Diagnosis not present

## 2022-01-06 DIAGNOSIS — I10 Essential (primary) hypertension: Secondary | ICD-10-CM | POA: Diagnosis not present

## 2022-01-06 DIAGNOSIS — Z01818 Encounter for other preprocedural examination: Secondary | ICD-10-CM

## 2022-01-06 HISTORY — DX: Other complications of anesthesia, initial encounter: T88.59XA

## 2022-01-06 LAB — CBC
HCT: 34.2 % — ABNORMAL LOW (ref 36.0–46.0)
Hemoglobin: 11.1 g/dL — ABNORMAL LOW (ref 12.0–15.0)
MCH: 28.9 pg (ref 26.0–34.0)
MCHC: 32.5 g/dL (ref 30.0–36.0)
MCV: 89.1 fL (ref 80.0–100.0)
Platelets: 554 10*3/uL — ABNORMAL HIGH (ref 150–400)
RBC: 3.84 MIL/uL — ABNORMAL LOW (ref 3.87–5.11)
RDW: 12.8 % (ref 11.5–15.5)
WBC: 6.5 10*3/uL (ref 4.0–10.5)
nRBC: 0 % (ref 0.0–0.2)

## 2022-01-06 LAB — COMPREHENSIVE METABOLIC PANEL
ALT: 29 U/L (ref 0–44)
AST: 32 U/L (ref 15–41)
Albumin: 3.7 g/dL (ref 3.5–5.0)
Alkaline Phosphatase: 57 U/L (ref 38–126)
Anion gap: 11 (ref 5–15)
BUN: 19 mg/dL (ref 8–23)
CO2: 23 mmol/L (ref 22–32)
Calcium: 9.7 mg/dL (ref 8.9–10.3)
Chloride: 103 mmol/L (ref 98–111)
Creatinine, Ser: 0.65 mg/dL (ref 0.44–1.00)
GFR, Estimated: >60 mL/min (ref >60)
Glucose, Bld: 96 mg/dL (ref 70–99)
Potassium: 3.4 mmol/L — ABNORMAL LOW (ref 3.5–5.1)
Sodium: 137 mmol/L (ref 135–145)
Total Bilirubin: 0.8 mg/dL (ref 0.3–1.2)
Total Protein: 8 g/dL (ref 6.5–8.1)

## 2022-01-06 LAB — SURGICAL PCR SCREEN
MRSA, PCR: NEGATIVE
Staphylococcus aureus: NEGATIVE

## 2022-01-06 LAB — GLUCOSE, CAPILLARY: Glucose-Capillary: 88 mg/dL (ref 70–99)

## 2022-01-07 LAB — HEMOGLOBIN A1C
Hgb A1c MFr Bld: 5.3 % (ref 4.8–5.6)
Mean Plasma Glucose: 105 mg/dL

## 2022-01-07 NOTE — Anesthesia Preprocedure Evaluation (Addendum)
Anesthesia Evaluation  Patient identified by MRN, date of birth, ID band Patient awake    Reviewed: Allergy & Precautions, NPO status , Patient's Chart, lab work & pertinent test results  Airway Mallampati: II  TM Distance: >3 FB Neck ROM: Full    Dental no notable dental hx. (+) Teeth Intact, Dental Advisory Given   Pulmonary asthma , sleep apnea and Continuous Positive Airway Pressure Ventilation    Pulmonary exam normal breath sounds clear to auscultation       Cardiovascular hypertension, + DVT  Normal cardiovascular exam Rhythm:Regular Rate:Normal     Neuro/Psych negative neurological ROS     GI/Hepatic ,GERD  Medicated,,  Endo/Other  diabetes, Type 2Hypothyroidism    Renal/GU      Musculoskeletal  (+) Arthritis ,  Fibromyalgia -  Abdominal   Peds  Hematology Lab Results      Component                Value               Date                      WBC                      6.5                 01/06/2022                HGB                      11.1 (L)            01/06/2022                HCT                      34.2 (L)            01/06/2022                MCV                      89.1                01/06/2022                PLT                      554 (H)             01/06/2022              Anesthesia Other Findings All ; Aithromycin, Keflex, Sulfa  Breast CA  Reproductive/Obstetrics                             Anesthesia Physical Anesthesia Plan  ASA: 3  Anesthesia Plan: Spinal and Regional   Post-op Pain Management:    Induction: Intravenous  PONV Risk Score and Plan: 3 and Treatment may vary due to age or medical condition, Ondansetron and Midazolam  Airway Management Planned: Nasal Cannula and Natural Airway  Additional Equipment: None  Intra-op Plan:   Post-operative Plan:   Informed Consent: I have reviewed the patients History and Physical, chart, labs and  discussed the procedure including the risks, benefits and alternatives for the proposed anesthesia with the  patient or authorized representative who has indicated his/her understanding and acceptance.     Dental advisory given  Plan Discussed with: CRNA, Anesthesiologist and Surgeon  Anesthesia Plan Comments: (See PAT note 01/06/2022  Spinal w R adductor )       Anesthesia Quick Evaluation

## 2022-01-07 NOTE — Progress Notes (Signed)
Anesthesia Chart Review   Case: [id number] Date/Time: 01/09/22 1110   Procedure: RIGHT TOTAL KNEE ARTHROPLASTY (Right: Knee)   Anesthesia type: Choice   Pre-op diagnosis: OSTEOARTHRITIS RIGHT KNEE   Location: [place] [facility] / [facility]   Surgeons: Mcarthur Rossetti, MD       DISCUSSION:61 y.o. never smoker with h/o HTN, sleep apnea, asthma, DVT, breast cancer, DM II (A1C 5.3), right knee OA scheduled for above procedure 01/09/2022 with Dr. Jean Rosenthal.   Pt evaluated by cardiology 05/07/2021 for palpitations and chest pressure.  Per note Echo 2/23 with EF 55-60%, Zio monitor with brief runs of SVT.  Cardiac CTA with minimal CAD. Ressurance given.    Per hematology OV note 12/17/2021, "Knee replacement surgery in December: I prescribed her 3 days of Lovenox injections to bridge from Eliquis to the day of surgery. She is at risk for thrombosis and therefore immediate postoperatively she needs to get started back on Eliquis. I reviewed her labs from [place] which did not show any hypercoagulable disorder. Our current labs have ruled out von Willebrand disease or factor VIII deficiencies.  This was done to evaluate the easy bruising on her left arm.  I would like to get a PT PTT INR today.  (These are normal)"  Last dose of Eliquis 01/05/2022, will start Lovenox bridge.   Last dose of Mounjaro 12/29/2021.   Anticipate pt can proceed with planned procedure barring acute status change.   VS: BP 128/73   Pulse 79   Temp 36.7 C (Oral)   Resp 12   Ht _0  (1.727 m)   Wt 85.3 kg   SpO2 100%   BMI 28.59 kg/m   PROVIDERS: Wenda Low, MD is PCP   Glori Bickers, MD is Cardiologist  LABS: Labs reviewed: Acceptable for surgery. (all labs ordered are listed, but only abnormal results are displayed)  Labs Reviewed  CBC - Abnormal; Notable for the following components:      Result Value   RBC 3.84 (*)    Hemoglobin 11.1 (*)    HCT 34.2 (*)    Platelets 554 (*)    All  other components within normal limits  COMPREHENSIVE METABOLIC PANEL - Abnormal; Notable for the following components:   Potassium 3.4 (*)    All other components within normal limits  SURGICAL PCR SCREEN  HEMOGLOBIN A1C  GLUCOSE, CAPILLARY  TYPE AND SCREEN     IMAGES:   EKG:   CV: Echo 03/07/2021 1. Left ventricular ejection fraction, by estimation, is 55 to 60%. The  left ventricle has normal function. The left ventricle has no regional  wall motion abnormalities. Left ventricular diastolic parameters were  normal. The average left ventricular  global longitudinal strain is -21.5 %. The global longitudinal strain is  normal.   2. Right ventricular systolic function is normal. The right ventricular  size is normal.   3. The mitral valve is normal in structure. Trivial mitral valve  regurgitation. No evidence of mitral stenosis.   4. The aortic valve is grossly normal. Aortic valve regurgitation is not  visualized. No aortic stenosis is present.   5. The inferior vena cava is normal in size with greater than 50%  respiratory variability, suggesting right atrial pressure of 3 mmHg.  Past Medical History:  Diagnosis Date   Anemia    Anxiety    Arthritis    Asthma    Breast cancer ([place]) 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, her-2 positive, ER/PR negative  Clotting disorder ([place])    testing was negative, h/o DVT while on treatment   Colon polyps    Complex regional pain syndrome i of right lower limb    RDS   Complication of anesthesia    Cystitis    Diabetes mellitus without complication ([place])    DVT (deep venous thrombosis) ([place])    started in 2015, multiple   Family history of breast cancer    Family history of colon cancer    Fibroid    Fibromyalgia    GERD (gastroesophageal reflux disease)    Heart murmur    HLD (hyperlipidemia)    HPV in female    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   Hypertension    Hypothyroidism    IBS (irritable bowel  syndrome)    Mitral valve prolapse    Peptic ulcer    Personal history of malignant neoplasm of breast    Sleep apnea treated with continuous positive airway pressure (CPAP)     Past Surgical History:  Procedure Laterality Date   ABDOMINAL HYSTERECTOMY  2010   fibroids, h/o abnormal pap smears   AXILLARY SENTINEL NODE BIOPSY Right 07/08/2020   Procedure: RIGHT AXILLARY SENTINEL NODE BIOPSY;  Surgeon: Rolm Bookbinder, MD;  Location: [place];  Service: General;  Laterality: Right;   BREAST RECONSTRUCTION WITH PLACEMENT OF TISSUE EXPANDER AND FLEX HD (ACELLULAR HYDRATED DERMIS) Bilateral 07/08/2020   Procedure: BILATERAL BREAST RECONSTRUCTION WITH  FLEX HD (ACELLULAR HYDRATED DERMIS),DIRECT IMPLANT RECONSTRUCTION;  Surgeon: Cindra Presume, MD;  Location: [place];  Service: Plastics;  Laterality: Bilateral;   BREAST SURGERY Right 2015   right lumpectomy with reduction and lift   COLONOSCOPY     DENTAL SURGERY     ESOPHAGOGASTRODUODENOSCOPY     LAPAROSCOPIC GASTRIC SLEEVE RESECTION  2014   LAPAROSCOPIC OOPHERECTOMY Bilateral 2016   REPLACEMENT TOTAL KNEE Left 2018   also had 3 prior arthroscopies   TOE SURGERY Right    dislocated toe   TOTAL HIP ARTHROPLASTY Right 11/19/2020   Procedure: RIGHT TOTAL HIP ARTHROPLASTY ANTERIOR APPROACH;  Surgeon: Mcarthur Rossetti, MD;  Location: [place];  Service: Orthopedics;  Laterality: Right;   TOTAL MASTECTOMY Bilateral 07/08/2020   Procedure: BILATERAL TOTAL MASTECTOMY;  Surgeon: Rolm Bookbinder, MD;  Location: [place];  Service: General;  Laterality: Bilateral;   VENA CAVA FILTER PLACEMENT  2017    MEDICATIONS:  acetaminophen (TYLENOL) 500 MG tablet   albuterol (VENTOLIN HFA) 108 (90 Base) MCG/ACT inhaler   amLODipine (NORVASC) 5 MG tablet   anastrozole (ARIMIDEX) 1 MG tablet   CALCIUM CITRATE PO   Cholecalciferol (VITAMIN D-3) 25 MCG (1000 UT) CAPS   DULoxetine (CYMBALTA) 30 MG capsule   DULoxetine (CYMBALTA) 60 MG capsule   ELIQUIS 5  MG TABS tablet   enoxaparin (LOVENOX) 80 MG/0.8ML injection   fluticasone (FLONASE) 50 MCG/ACT nasal spray   gabapentin (NEURONTIN) 600 MG tablet   ipratropium (ATROVENT) 0.03 % nasal spray   Lactobacillus Rhamnosus, GG, (CULTURELLE PO)   levothyroxine (SYNTHROID) 50 MCG tablet   NON FORMULARY   oxyCODONE (ROXICODONE) 5 MG immediate release tablet   pantoprazole (PROTONIX) 40 MG tablet   rosuvastatin (CRESTOR) 5 MG tablet   tirzepatide (MOUNJARO) 10 MG/0.5ML Pen   valACYclovir (VALTREX) 500 MG tablet    triamcinolone acetonide (KENALOG) 10 MG/ML injection 10 mg     Konrad Felix Ward, PA-C [facility] [phone]

## 2022-01-08 NOTE — H&P (Signed)
TOTAL KNEE ADMISSION H&P  Patient is being admitted for right total knee arthroplasty.  Subjective:  Chief Complaint:right knee pain.  HPI: Sierra Perez, 61 y.o. female, has a history of pain and functional disability in the right knee due to arthritis and has failed non-surgical conservative treatments for greater than 12 weeks to includeNSAID's and/or analgesics, corticosteriod injections, viscosupplementation injections, and flexibility and strengthening excercises.  Onset of symptoms was gradual, starting 5 years ago with gradually worsening course since that time. The patient noted prior procedures on the knee to include  arthroscopy on the right knee(s).  Patient currently rates pain in the right knee(s) at 10 out of 10 with activity. Patient has night pain, worsening of pain with activity and weight bearing, pain that interferes with activities of daily living, pain with passive range of motion, crepitus, and joint swelling.  Patient has evidence of subchondral sclerosis, periarticular osteophytes, and joint space narrowing by imaging studies. There is no active infection.  Patient Active Problem List   Diagnosis Date Noted   Obesity (BMI 30-39.9) 06/23/2021   Iron deficiency anemia 01/23/2021   Status post right hip replacement 12/03/2020   Status post total replacement of right hip 11/19/2020   Breast cancer ([place]) 07/08/2020   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   H/O total hysterectomy 06/11/2020   Genetic testing 05/27/2020   Chronic superficial gastritis 05/27/2020   Fibromyalgia 05/27/2020   GERD (gastroesophageal reflux disease) 05/27/2020   Prediabetes 05/27/2020   Complex regional pain syndrome I, unspecified 05/27/2020   Atypical lobular hyperplasia [facility]) of left breast 05/23/2020   History of therapeutic radiation 05/23/2020   Family history of breast cancer    Family history of colon cancer    Personal history of malignant neoplasm of breast    Unilateral primary  osteoarthritis, right knee 02/26/2020   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   DVT (deep venous thrombosis) ([place]) 05/04/2018   Malignant neoplasm of upper-outer quadrant of right breast in female, estrogen receptor negative ([place]) 05/03/2018   Obstructive sleep apnea (adult) (pediatric) 09/14/2017   Adenopathy 07/15/2017   B12 deficiency 07/15/2017   Disorder of tympanic membrane of right ear 07/15/2017   Recurrent UTI 03/09/2017   Bladder pain 02/25/2017   Proteinuria 02/25/2017   Hematoma of thigh, left, subsequent encounter 08/16/2016   At risk for obstructive sleep apnea 07/01/2016   Suspected sleep apnea 07/01/2016   Arthritis of left knee 05/26/2016   Localized primary osteoarthritis of lower leg, left 05/26/2016   Clotting disorder ([place]) 05/22/2016   Chronic anticoagulation 01/06/2016   Right-sided epistaxis 01/06/2016   Urinary tract infection, site not specified 08/12/2015   Abnormal Pap smear of vagina 05/06/2015   Follow-up examination, following other surgery 12/14/2013   Pelvic and perineal pain 11/28/2013   Anxiety and depression 11/16/2013   H/O bariatric surgery 11/16/2013   Atrophic vaginitis 11/13/2013   Tear of lateral cartilage or meniscus of knee, current 04/14/2013   Allergic rhinitis 01/16/2013   Female genital symptoms 11/10/2012   Symptomatic menopausal or female climacteric states 11/10/2012   Disequilibrium 06/09/2012   Morbid obesity ([place]) 02/09/2012   Vitamin D deficiency 09/13/2008   Preop examination 04/20/2008   Hypothyroidism 05/20/2007   Past Medical History:  Diagnosis Date   Anemia    Anxiety    Arthritis    Asthma    Breast cancer ([place]) 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, her-2 positive, ER/PR negative   Clotting disorder ([place])    testing was negative, h/o DVT while on  treatment   Colon polyps    Complex regional pain syndrome i of right lower limb    RDS   Complication of anesthesia    Cystitis    Diabetes mellitus without complication ([place])    DVT  (deep venous thrombosis) ([place])    started in 2015, multiple   Family history of breast cancer    Family history of colon cancer    Fibroid    Fibromyalgia    GERD (gastroesophageal reflux disease)    Heart murmur    HLD (hyperlipidemia)    HPV in female    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   Hypertension    Hypothyroidism    IBS (irritable bowel syndrome)    Mitral valve prolapse    Peptic ulcer    Personal history of malignant neoplasm of breast    Sleep apnea treated with continuous positive airway pressure (CPAP)     Past Surgical History:  Procedure Laterality Date   ABDOMINAL HYSTERECTOMY  2010   fibroids, h/o abnormal pap smears   AXILLARY SENTINEL NODE BIOPSY Right 07/08/2020   Procedure: RIGHT AXILLARY SENTINEL NODE BIOPSY;  Surgeon: Rolm Bookbinder, MD;  Location: [place];  Service: General;  Laterality: Right;   BREAST RECONSTRUCTION WITH PLACEMENT OF TISSUE EXPANDER AND FLEX HD (ACELLULAR HYDRATED DERMIS) Bilateral 07/08/2020   Procedure: BILATERAL BREAST RECONSTRUCTION WITH  FLEX HD (ACELLULAR HYDRATED DERMIS),DIRECT IMPLANT RECONSTRUCTION;  Surgeon: Cindra Presume, MD;  Location: [place];  Service: Plastics;  Laterality: Bilateral;   BREAST SURGERY Right 2015   right lumpectomy with reduction and lift   COLONOSCOPY     DENTAL SURGERY     ESOPHAGOGASTRODUODENOSCOPY     LAPAROSCOPIC GASTRIC SLEEVE RESECTION  2014   LAPAROSCOPIC OOPHERECTOMY Bilateral 2016   REPLACEMENT TOTAL KNEE Left 2018   also had 3 prior arthroscopies   TOE SURGERY Right    dislocated toe   TOTAL HIP ARTHROPLASTY Right 11/19/2020   Procedure: RIGHT TOTAL HIP ARTHROPLASTY ANTERIOR APPROACH;  Surgeon: Mcarthur Rossetti, MD;  Location: [place];  Service: Orthopedics;  Laterality: Right;   TOTAL MASTECTOMY Bilateral 07/08/2020   Procedure: BILATERAL TOTAL MASTECTOMY;  Surgeon: Rolm Bookbinder, MD;  Location: [place];  Service: General;  Laterality: Bilateral;   VENA  CAVA FILTER PLACEMENT  2017    Current Facility-Administered Medications  Medication Dose Route Frequency Provider Last Rate Last Admin   triamcinolone acetonide (KENALOG) 10 MG/ML injection 10 mg  10 mg Other Once Bronson Ing, DPM       Current Outpatient Medications  Medication Sig Dispense Refill Last Dose   acetaminophen (TYLENOL) 500 MG tablet Take 1,000 mg by mouth every 6 (six) hours as needed for moderate pain.      albuterol (VENTOLIN HFA) 108 (90 Base) MCG/ACT inhaler Inhale 1 puff into the lungs every 6 (six) hours as needed for wheezing or shortness of breath.      amLODipine (NORVASC) 5 MG tablet Take 5 mg by mouth daily.      anastrozole (ARIMIDEX) 1 MG tablet Take 1 tablet (1 mg total) by mouth daily. 90 tablet 3    CALCIUM CITRATE PO Take 2 tablets by mouth daily.      Cholecalciferol (VITAMIN D-3) 25 MCG (1000 UT) CAPS Take 1,000 Units by mouth daily.      DULoxetine (CYMBALTA) 30 MG capsule 30 mg daily.      DULoxetine (CYMBALTA) 60 MG capsule Take 60 mg by mouth  daily.      ELIQUIS 5 MG TABS tablet TAKE 1 TABLET(5 MG) BY MOUTH TWICE DAILY 180 tablet 2    fluticasone (FLONASE) 50 MCG/ACT nasal spray Place 2 sprays into both nostrils daily.      gabapentin (NEURONTIN) 600 MG tablet Take 600 mg by mouth 2 (two) times daily.      ipratropium (ATROVENT) 0.03 % nasal spray Place 2 sprays into both nostrils 2 (two) times daily.      Lactobacillus Rhamnosus, GG, (CULTURELLE PO) Take 1 capsule by mouth daily.      levothyroxine (SYNTHROID) 50 MCG tablet Take 50 mcg by mouth every morning.      NON FORMULARY Pt uses a cpap nightly      oxyCODONE (ROXICODONE) 5 MG immediate release tablet Take 1 tablet (5 mg total) by mouth every 6 (six) hours as needed for severe pain. 30 tablet 0    pantoprazole (PROTONIX) 40 MG tablet Take 1 tablet (40 mg total) by mouth daily.      rosuvastatin (CRESTOR) 5 MG tablet Take 1 tablet (5 mg total) by mouth daily. 90 tablet 3    valACYclovir  (VALTREX) 500 MG tablet Take 1 tablet (500 mg total) by mouth daily. Increase to bid x 3 days with symptoms (Patient taking differently: Take 500 mg by mouth daily as needed (outbreaks). Increase to bid x 3 days with symptoms) 120 tablet 4    enoxaparin (LOVENOX) 80 MG/0.8ML injection Inject 0.8 mLs (80 mg total) into the skin every 12 (twelve) hours. 6 mL 0    tirzepatide (MOUNJARO) 10 MG/0.5ML Pen Inject 10 mg into the skin once a week. 2 mL 11    Allergies  Allergen Reactions   Succinylcholine Other (See Comments)    Muscle aching  18 years ago from 07/02/20   Azithromycin     **DOES NOT WORK** **DOES NOT WORK**    Keflex [Cephalexin] Diarrhea and Nausea And Vomiting   Silver Rash   Sulfa Antibiotics Rash    And swelling generalized not in throat   Sulfasalazine Rash    Social History   Tobacco Use   Smoking status: Never   Smokeless tobacco: Never  Substance Use Topics   Alcohol use: Yes    Comment: rare    Family History  Problem Relation Age of Onset   Hypertension Mother    Skin cancer Mother    Colon polyps Mother    Valvular heart disease Mother    Kidney disease Mother    Diabetes Father    Kidney disease Father    Dementia Father    Factor VIII deficiency Father    Colon polyps Brother    Bladder Cancer Maternal Grandmother        dx in late 28s; smoker   Stroke Maternal Grandmother    Diabetes Maternal Grandmother    Lung cancer Maternal Grandfather    Colon cancer Maternal Grandfather 80       origin   Thyroid cancer Maternal Grandfather        dx in his 34s   Breast cancer Paternal Grandmother        dx in 19s   Prostate cancer Other        MGMs brother     Review of Systems  Objective:  Physical Exam Vitals reviewed.  Constitutional:      Appearance: Normal appearance. She is normal weight.  HENT:     Head: Normocephalic and atraumatic.  Eyes:  Extraocular Movements: Extraocular movements intact.     Pupils: Pupils are equal, round,  and reactive to light.  Cardiovascular:     Rate and Rhythm: Normal rate.     Pulses: Normal pulses.  Pulmonary:     Effort: Pulmonary effort is normal.     Breath sounds: Normal breath sounds.  Abdominal:     Palpations: Abdomen is soft.  Musculoskeletal:     Cervical back: Normal range of motion and neck supple.     Right knee: Effusion, bony tenderness and crepitus present. Decreased range of motion. Tenderness present over the medial joint line and lateral joint line. Abnormal alignment and abnormal meniscus.  Neurological:     Mental Status: She is alert and oriented to person, place, and time.  Psychiatric:        Behavior: Behavior normal.     Vital signs in last 24 hours:    Labs:   Estimated body mass index is 28.59 kg/m as calculated from the following:   Height as of 01/06/22: _0  (1.727 m).   Weight as of 01/06/22: 85.3 kg.   Imaging Review Plain radiographs demonstrate severe degenerative joint disease of the right knee(s). The overall alignment ismild varus. The bone quality appears to be excellent for age and reported activity level.      Assessment/Plan:  End stage arthritis, right knee   The patient history, physical examination, clinical judgment of the provider and imaging studies are consistent with end stage degenerative joint disease of the right knee(s) and total knee arthroplasty is deemed medically necessary. The treatment options including medical management, injection therapy arthroscopy and arthroplasty were discussed at length. The risks and benefits of total knee arthroplasty were presented and reviewed. The risks due to aseptic loosening, infection, stiffness, patella tracking problems, thromboembolic complications and other imponderables were discussed. The patient acknowledged the explanation, agreed to proceed with the plan and consent was signed. Patient is being admitted for inpatient treatment for surgery, pain control, PT, OT,  prophylactic antibiotics, VTE prophylaxis, progressive ambulation and ADL's and discharge planning. The patient is planning to be discharged home with home health services

## 2022-01-09 ENCOUNTER — Encounter (HOSPITAL_COMMUNITY)
Admission: RE | Disposition: A | Discharge status: Home Health | Payer: Self-pay | Source: Home / Self Care | Attending: Orthopaedic Surgery

## 2022-01-09 ENCOUNTER — Observation Stay (HOSPITAL_COMMUNITY)
Admission: RE | Admit: 2022-01-09 | Discharge: 2022-01-16 | Disposition: A | Discharge status: Home Health | Payer: 59 | Attending: Orthopaedic Surgery | Admitting: Orthopaedic Surgery

## 2022-01-09 ENCOUNTER — Other Ambulatory Visit: Payer: Self-pay

## 2022-01-09 ENCOUNTER — Encounter (HOSPITAL_COMMUNITY): Payer: Self-pay | Admitting: Orthopaedic Surgery

## 2022-01-09 ENCOUNTER — Observation Stay (HOSPITAL_COMMUNITY): Payer: 59

## 2022-01-09 ENCOUNTER — Ambulatory Visit (HOSPITAL_COMMUNITY): Payer: 59 | Admitting: Physician Assistant

## 2022-01-09 ENCOUNTER — Ambulatory Visit (HOSPITAL_BASED_OUTPATIENT_CLINIC_OR_DEPARTMENT_OTHER): Payer: 59 | Admitting: Anesthesiology

## 2022-01-09 DIAGNOSIS — Z96651 Presence of right artificial knee joint: Secondary | ICD-10-CM

## 2022-01-09 DIAGNOSIS — Z8711 Personal history of peptic ulcer disease: Secondary | ICD-10-CM

## 2022-01-09 DIAGNOSIS — K2930 Chronic superficial gastritis without bleeding: Secondary | ICD-10-CM | POA: Diagnosis present

## 2022-01-09 DIAGNOSIS — Z96652 Presence of left artificial knee joint: Secondary | ICD-10-CM | POA: Diagnosis not present

## 2022-01-09 DIAGNOSIS — Z87440 Personal history of urinary (tract) infections: Secondary | ICD-10-CM

## 2022-01-09 DIAGNOSIS — D689 Coagulation defect, unspecified: Secondary | ICD-10-CM | POA: Diagnosis present

## 2022-01-09 DIAGNOSIS — E119 Type 2 diabetes mellitus without complications: Secondary | ICD-10-CM | POA: Diagnosis not present

## 2022-01-09 DIAGNOSIS — G4733 Obstructive sleep apnea (adult) (pediatric): Secondary | ICD-10-CM | POA: Diagnosis not present

## 2022-01-09 DIAGNOSIS — M1711 Unilateral primary osteoarthritis, right knee: Secondary | ICD-10-CM

## 2022-01-09 DIAGNOSIS — K219 Gastro-esophageal reflux disease without esophagitis: Secondary | ICD-10-CM | POA: Diagnosis present

## 2022-01-09 DIAGNOSIS — Z9884 Bariatric surgery status: Secondary | ICD-10-CM

## 2022-01-09 DIAGNOSIS — D509 Iron deficiency anemia, unspecified: Secondary | ICD-10-CM | POA: Diagnosis present

## 2022-01-09 DIAGNOSIS — Z96641 Presence of right artificial hip joint: Secondary | ICD-10-CM | POA: Diagnosis not present

## 2022-01-09 DIAGNOSIS — I341 Nonrheumatic mitral (valve) prolapse: Secondary | ICD-10-CM | POA: Diagnosis present

## 2022-01-09 DIAGNOSIS — Z79899 Other long term (current) drug therapy: Secondary | ICD-10-CM | POA: Diagnosis not present

## 2022-01-09 DIAGNOSIS — Z803 Family history of malignant neoplasm of breast: Secondary | ICD-10-CM

## 2022-01-09 DIAGNOSIS — Z7901 Long term (current) use of anticoagulants: Secondary | ICD-10-CM | POA: Diagnosis not present

## 2022-01-09 DIAGNOSIS — Z8619 Personal history of other infectious and parasitic diseases: Secondary | ICD-10-CM

## 2022-01-09 DIAGNOSIS — I10 Essential (primary) hypertension: Secondary | ICD-10-CM | POA: Diagnosis not present

## 2022-01-09 DIAGNOSIS — Z9989 Dependence on other enabling machines and devices: Secondary | ICD-10-CM | POA: Diagnosis not present

## 2022-01-09 DIAGNOSIS — Z1501 Genetic susceptibility to malignant neoplasm of breast: Secondary | ICD-10-CM

## 2022-01-09 DIAGNOSIS — Z8249 Family history of ischemic heart disease and other diseases of the circulatory system: Secondary | ICD-10-CM

## 2022-01-09 DIAGNOSIS — Z90710 Acquired absence of both cervix and uterus: Secondary | ICD-10-CM

## 2022-01-09 DIAGNOSIS — R011 Cardiac murmur, unspecified: Secondary | ICD-10-CM | POA: Diagnosis present

## 2022-01-09 DIAGNOSIS — F419 Anxiety disorder, unspecified: Secondary | ICD-10-CM | POA: Diagnosis present

## 2022-01-09 DIAGNOSIS — E559 Vitamin D deficiency, unspecified: Secondary | ICD-10-CM | POA: Diagnosis present

## 2022-01-09 DIAGNOSIS — Z9013 Acquired absence of bilateral breasts and nipples: Secondary | ICD-10-CM

## 2022-01-09 DIAGNOSIS — E538 Deficiency of other specified B group vitamins: Secondary | ICD-10-CM | POA: Diagnosis present

## 2022-01-09 DIAGNOSIS — Z86718 Personal history of other venous thrombosis and embolism: Secondary | ICD-10-CM | POA: Diagnosis not present

## 2022-01-09 DIAGNOSIS — G90521 Complex regional pain syndrome I of right lower limb: Secondary | ICD-10-CM | POA: Diagnosis present

## 2022-01-09 DIAGNOSIS — K589 Irritable bowel syndrome without diarrhea: Secondary | ICD-10-CM | POA: Diagnosis present

## 2022-01-09 DIAGNOSIS — Z79811 Long term (current) use of aromatase inhibitors: Secondary | ICD-10-CM

## 2022-01-09 DIAGNOSIS — Z86010 Personal history of colonic polyps: Secondary | ICD-10-CM

## 2022-01-09 DIAGNOSIS — J45909 Unspecified asthma, uncomplicated: Secondary | ICD-10-CM | POA: Diagnosis not present

## 2022-01-09 DIAGNOSIS — Z90722 Acquired absence of ovaries, bilateral: Secondary | ICD-10-CM

## 2022-01-09 DIAGNOSIS — M797 Fibromyalgia: Secondary | ICD-10-CM | POA: Diagnosis present

## 2022-01-09 DIAGNOSIS — M659 Synovitis and tenosynovitis, unspecified: Secondary | ICD-10-CM | POA: Diagnosis present

## 2022-01-09 DIAGNOSIS — E785 Hyperlipidemia, unspecified: Secondary | ICD-10-CM | POA: Diagnosis present

## 2022-01-09 DIAGNOSIS — Z833 Family history of diabetes mellitus: Secondary | ICD-10-CM

## 2022-01-09 DIAGNOSIS — Z83719 Family history of colon polyps, unspecified: Secondary | ICD-10-CM

## 2022-01-09 DIAGNOSIS — R87810 Cervical high risk human papillomavirus (HPV) DNA test positive: Secondary | ICD-10-CM | POA: Diagnosis present

## 2022-01-09 DIAGNOSIS — E039 Hypothyroidism, unspecified: Secondary | ICD-10-CM | POA: Diagnosis not present

## 2022-01-09 DIAGNOSIS — Z79890 Hormone replacement therapy (postmenopausal): Secondary | ICD-10-CM

## 2022-01-09 DIAGNOSIS — Z853 Personal history of malignant neoplasm of breast: Secondary | ICD-10-CM | POA: Insufficient documentation

## 2022-01-09 DIAGNOSIS — F32A Depression, unspecified: Secondary | ICD-10-CM | POA: Diagnosis present

## 2022-01-09 DIAGNOSIS — Z832 Family history of diseases of the blood and blood-forming organs and certain disorders involving the immune mechanism: Secondary | ICD-10-CM

## 2022-01-09 DIAGNOSIS — Z7985 Long-term (current) use of injectable non-insulin antidiabetic drugs: Secondary | ICD-10-CM

## 2022-01-09 DIAGNOSIS — D62 Acute posthemorrhagic anemia: Secondary | ICD-10-CM | POA: Diagnosis not present

## 2022-01-09 DIAGNOSIS — R7303 Prediabetes: Secondary | ICD-10-CM

## 2022-01-09 HISTORY — PX: TOTAL KNEE ARTHROPLASTY: SHX125

## 2022-01-09 LAB — GLUCOSE, CAPILLARY
Glucose-Capillary: 120 mg/dL — ABNORMAL HIGH (ref 70–99)
Glucose-Capillary: 105 mg/dL — ABNORMAL HIGH (ref 70–99)
Glucose-Capillary: 235 mg/dL — ABNORMAL HIGH (ref 70–99)

## 2022-01-09 LAB — TYPE AND SCREEN
ABO/RH(D): O POS
Antibody Screen: NEGATIVE

## 2022-01-09 SURGERY — ARTHROPLASTY, KNEE, TOTAL
Anesthesia: Regional | Site: Knee | Laterality: Right

## 2022-01-09 MED ORDER — ALUM & MAG HYDROXIDE-SIMETH 200-200-20 MG/5ML PO SUSP: 30.0000 mL | ORAL | Status: DC | PRN

## 2022-01-09 MED ORDER — PROPOFOL 1000 MG/100ML IV EMUL
INTRAVENOUS | Status: AC
  Filled 2022-01-09: qty 100

## 2022-01-09 MED ORDER — METOCLOPRAMIDE HCL 5 MG/ML IJ SOLN: 5.0000 mg | Freq: Three times a day (TID) | INTRAMUSCULAR | Status: DC | PRN

## 2022-01-09 MED ORDER — SODIUM CHLORIDE 0.9 % IR SOLN
Status: DC | PRN
  Administered 2022-01-09: 1000 mL

## 2022-01-09 MED ORDER — LACTATED RINGERS IV SOLN: INTRAVENOUS | Status: DC

## 2022-01-09 MED ORDER — FENTANYL CITRATE (PF) 100 MCG/2ML IJ SOLN
INTRAMUSCULAR | Status: DC | PRN
  Administered 2022-01-09: 100 ug via INTRAVENOUS

## 2022-01-09 MED ORDER — VITAMIN D 25 MCG (1000 UNIT) PO TABS
1000.0000 [IU] | ORAL_TABLET | Freq: Every day | ORAL | Status: DC
  Administered 2022-01-10 – 2022-01-16 (×7): 1000 [IU] via ORAL
  Filled 2022-01-09 (×7): qty 1

## 2022-01-09 MED ORDER — BUPIVACAINE IN DEXTROSE 0.75-8.25 % IT SOLN
INTRATHECAL | Status: DC | PRN
  Administered 2022-01-09: 1.7 mL via INTRATHECAL

## 2022-01-09 MED ORDER — PANTOPRAZOLE SODIUM 40 MG PO TBEC
40.0000 mg | DELAYED_RELEASE_TABLET | Freq: Every day | ORAL | Status: DC
  Administered 2022-01-10 – 2022-01-16 (×7): 40 mg via ORAL
  Filled 2022-01-09 (×7): qty 1

## 2022-01-09 MED ORDER — MIDAZOLAM HCL 5 MG/5ML IJ SOLN
INTRAMUSCULAR | Status: DC | PRN
  Administered 2022-01-09: 2 mg via INTRAVENOUS

## 2022-01-09 MED ORDER — DOCUSATE SODIUM 100 MG PO CAPS
100.0000 mg | ORAL_CAPSULE | Freq: Two times a day (BID) | ORAL | Status: DC
  Administered 2022-01-09 – 2022-01-16 (×14): 100 mg via ORAL
  Filled 2022-01-09 (×14): qty 1

## 2022-01-09 MED ORDER — ONDANSETRON HCL 4 MG/2ML IJ SOLN: 4.0000 mg | Freq: Four times a day (QID) | INTRAMUSCULAR | Status: DC | PRN

## 2022-01-09 MED ORDER — ONDANSETRON HCL 4 MG/2ML IJ SOLN: 4.0000 mg | Freq: Once | INTRAMUSCULAR | Status: DC | PRN

## 2022-01-09 MED ORDER — ENOXAPARIN SODIUM 80 MG/0.8ML IJ SOSY
80.0000 mg | PREFILLED_SYRINGE | Freq: Two times a day (BID) | INTRAMUSCULAR | Status: AC
  Administered 2022-01-09: 80 mg via SUBCUTANEOUS
  Filled 2022-01-09: qty 0.8

## 2022-01-09 MED ORDER — BUPIVACAINE HCL (PF) 0.5 % IJ SOLN
INTRAMUSCULAR | Status: AC
  Filled 2022-01-09: qty 30

## 2022-01-09 MED ORDER — DULOXETINE HCL 60 MG PO CPEP: 60.0000 mg | ORAL_CAPSULE | Freq: Every day | ORAL | Status: DC

## 2022-01-09 MED ORDER — PHENYLEPHRINE 80 MCG/ML (10ML) SYRINGE FOR IV PUSH (FOR BLOOD PRESSURE SUPPORT)
PREFILLED_SYRINGE | INTRAVENOUS | Status: AC
  Filled 2022-01-09: qty 10

## 2022-01-09 MED ORDER — PROPOFOL 10 MG/ML IV BOLUS
INTRAVENOUS | Status: AC
  Filled 2022-01-09: qty 20

## 2022-01-09 MED ORDER — AMLODIPINE BESYLATE 5 MG PO TABS
5.0000 mg | ORAL_TABLET | Freq: Every day | ORAL | Status: DC
  Administered 2022-01-10 – 2022-01-16 (×7): 5 mg via ORAL
  Filled 2022-01-09 (×7): qty 1

## 2022-01-09 MED ORDER — ONDANSETRON HCL 4 MG/2ML IJ SOLN
INTRAMUSCULAR | Status: AC
  Filled 2022-01-09: qty 2

## 2022-01-09 MED ORDER — HYDROMORPHONE HCL 1 MG/ML IJ SOLN
0.5000 mg | INTRAMUSCULAR | Status: DC | PRN
  Administered 2022-01-10: 1 mg via INTRAVENOUS
  Filled 2022-01-09: qty 1

## 2022-01-09 MED ORDER — FENTANYL CITRATE (PF) 100 MCG/2ML IJ SOLN
INTRAMUSCULAR | Status: AC
  Filled 2022-01-09: qty 2

## 2022-01-09 MED ORDER — ONDANSETRON HCL 4 MG/2ML IJ SOLN
INTRAMUSCULAR | Status: DC | PRN
  Administered 2022-01-09: 4 mg via INTRAVENOUS

## 2022-01-09 MED ORDER — FENTANYL CITRATE PF 50 MCG/ML IJ SOSY
PREFILLED_SYRINGE | INTRAMUSCULAR | Status: AC
  Filled 2022-01-09: qty 1

## 2022-01-09 MED ORDER — MIDAZOLAM HCL 2 MG/2ML IJ SOLN
1.0000 mg | INTRAMUSCULAR | Status: DC
  Administered 2022-01-09: 2 mg via INTRAVENOUS
  Filled 2022-01-09: qty 2

## 2022-01-09 MED ORDER — DULOXETINE HCL 30 MG PO CPEP
90.0000 mg | ORAL_CAPSULE | Freq: Every day | ORAL | Status: DC
  Administered 2022-01-10 – 2022-01-16 (×7): 90 mg via ORAL
  Filled 2022-01-09 (×7): qty 3

## 2022-01-09 MED ORDER — OXYCODONE HCL 5 MG PO TABS
5.0000 mg | ORAL_TABLET | ORAL | Status: DC | PRN
  Administered 2022-01-09 – 2022-01-10 (×5): 10 mg via ORAL
  Filled 2022-01-09 (×4): qty 2

## 2022-01-09 MED ORDER — METOCLOPRAMIDE HCL 5 MG PO TABS: 5.0000 mg | ORAL_TABLET | Freq: Three times a day (TID) | ORAL | Status: DC | PRN

## 2022-01-09 MED ORDER — PROPOFOL 10 MG/ML IV BOLUS
INTRAVENOUS | Status: DC | PRN
  Administered 2022-01-09: 20 mg via INTRAVENOUS

## 2022-01-09 MED ORDER — DIPHENHYDRAMINE HCL 12.5 MG/5ML PO ELIX: 12.5000 mg | ORAL_SOLUTION | ORAL | Status: DC | PRN

## 2022-01-09 MED ORDER — TRANEXAMIC ACID-NACL 1000-0.7 MG/100ML-% IV SOLN
1000.0000 mg | INTRAVENOUS | Status: AC
  Administered 2022-01-09: 1000 mg via INTRAVENOUS
  Filled 2022-01-09: qty 100

## 2022-01-09 MED ORDER — MIDAZOLAM HCL 2 MG/2ML IJ SOLN
INTRAMUSCULAR | Status: AC
  Filled 2022-01-09: qty 2

## 2022-01-09 MED ORDER — OXYCODONE HCL 5 MG PO TABS
ORAL_TABLET | ORAL | Status: AC
  Filled 2022-01-09: qty 2

## 2022-01-09 MED ORDER — PHENYLEPHRINE 80 MCG/ML (10ML) SYRINGE FOR IV PUSH (FOR BLOOD PRESSURE SUPPORT)
PREFILLED_SYRINGE | INTRAVENOUS | Status: DC | PRN
  Administered 2022-01-09 (×3): 160 ug via INTRAVENOUS

## 2022-01-09 MED ORDER — ACETAMINOPHEN 10 MG/ML IV SOLN
1000.0000 mg | Freq: Once | INTRAVENOUS | Status: DC | PRN
  Administered 2022-01-09: 1000 mg via INTRAVENOUS

## 2022-01-09 MED ORDER — GABAPENTIN 300 MG PO CAPS
600.0000 mg | ORAL_CAPSULE | Freq: Three times a day (TID) | ORAL | Status: DC
  Administered 2022-01-09 – 2022-01-16 (×20): 600 mg via ORAL
  Filled 2022-01-09 (×20): qty 2

## 2022-01-09 MED ORDER — METHOCARBAMOL 500 MG IVPB - SIMPLE MED
500.0000 mg | Freq: Four times a day (QID) | INTRAVENOUS | Status: DC | PRN
  Administered 2022-01-09: 500 mg via INTRAVENOUS

## 2022-01-09 MED ORDER — POVIDONE-IODINE 10 % EX SWAB
2.0000 | Freq: Once | CUTANEOUS | Status: AC
  Administered 2022-01-09: 2 via TOPICAL

## 2022-01-09 MED ORDER — ROSUVASTATIN CALCIUM 5 MG PO TABS
5.0000 mg | ORAL_TABLET | Freq: Every day | ORAL | Status: DC
  Administered 2022-01-10 – 2022-01-16 (×7): 5 mg via ORAL
  Filled 2022-01-09 (×7): qty 1

## 2022-01-09 MED ORDER — APIXABAN 5 MG PO TABS
5.0000 mg | ORAL_TABLET | Freq: Two times a day (BID) | ORAL | Status: DC
  Administered 2022-01-10 – 2022-01-16 (×13): 5 mg via ORAL
  Filled 2022-01-09 (×13): qty 1

## 2022-01-09 MED ORDER — METHOCARBAMOL 500 MG PO TABS
500.0000 mg | ORAL_TABLET | Freq: Four times a day (QID) | ORAL | Status: DC | PRN
  Administered 2022-01-10 – 2022-01-16 (×8): 500 mg via ORAL
  Filled 2022-01-09 (×8): qty 1

## 2022-01-09 MED ORDER — PHENYLEPHRINE HCL-NACL 20-0.9 MG/250ML-% IV SOLN
INTRAVENOUS | Status: AC
  Filled 2022-01-09: qty 250

## 2022-01-09 MED ORDER — ANASTROZOLE 1 MG PO TABS
1.0000 mg | ORAL_TABLET | Freq: Every day | ORAL | Status: DC
  Filled 2022-01-09: qty 1

## 2022-01-09 MED ORDER — HYDROMORPHONE HCL 2 MG PO TABS
2.0000 mg | ORAL_TABLET | ORAL | Status: DC | PRN
  Administered 2022-01-10 (×2): 2 mg via ORAL
  Filled 2022-01-09 (×2): qty 1

## 2022-01-09 MED ORDER — CHLORHEXIDINE GLUCONATE 0.12 % MT SOLN
15.0000 mL | Freq: Once | OROMUCOSAL | Status: AC
  Administered 2022-01-09: 15 mL via OROMUCOSAL

## 2022-01-09 MED ORDER — LIDOCAINE 2% (20 MG/ML) 5 ML SYRINGE
INTRAMUSCULAR | Status: DC | PRN
  Administered 2022-01-09: 100 mg via INTRAVENOUS

## 2022-01-09 MED ORDER — OXYCODONE HCL ER 10 MG PO T12A
10.0000 mg | EXTENDED_RELEASE_TABLET | Freq: Two times a day (BID) | ORAL | Status: DC
  Administered 2022-01-09 – 2022-01-14 (×10): 10 mg via ORAL
  Filled 2022-01-09 (×10): qty 1

## 2022-01-09 MED ORDER — VITAMIN C 500 MG PO TABS
500.0000 mg | ORAL_TABLET | Freq: Every day | ORAL | Status: DC
  Administered 2022-01-10 – 2022-01-16 (×7): 500 mg via ORAL
  Filled 2022-01-09 (×7): qty 1

## 2022-01-09 MED ORDER — ACETAMINOPHEN 325 MG PO TABS
325.0000 mg | ORAL_TABLET | Freq: Four times a day (QID) | ORAL | Status: DC | PRN
  Administered 2022-01-13 – 2022-01-15 (×3): 650 mg via ORAL
  Filled 2022-01-09 (×4): qty 2

## 2022-01-09 MED ORDER — CEFAZOLIN SODIUM-DEXTROSE 2-4 GM/100ML-% IV SOLN
2.0000 g | INTRAVENOUS | Status: AC
  Administered 2022-01-09: 2 g via INTRAVENOUS
  Filled 2022-01-09: qty 100

## 2022-01-09 MED ORDER — FENTANYL CITRATE PF 50 MCG/ML IJ SOSY
25.0000 ug | PREFILLED_SYRINGE | INTRAMUSCULAR | Status: DC | PRN
  Administered 2022-01-09 (×2): 50 ug via INTRAVENOUS

## 2022-01-09 MED ORDER — ALBUTEROL SULFATE (2.5 MG/3ML) 0.083% IN NEBU: 2.5000 mg | INHALATION_SOLUTION | Freq: Four times a day (QID) | RESPIRATORY_TRACT | Status: DC | PRN

## 2022-01-09 MED ORDER — SODIUM CHLORIDE 0.9 % IV SOLN: INTRAVENOUS | Status: DC

## 2022-01-09 MED ORDER — ACETAMINOPHEN 10 MG/ML IV SOLN
INTRAVENOUS | Status: AC
  Filled 2022-01-09: qty 100

## 2022-01-09 MED ORDER — PHENOL 1.4 % MT LIQD: 1.0000 | OROMUCOSAL | Status: DC | PRN

## 2022-01-09 MED ORDER — LEVOTHYROXINE SODIUM 50 MCG PO TABS
50.0000 ug | ORAL_TABLET | Freq: Every day | ORAL | Status: DC
  Administered 2022-01-10 – 2022-01-16 (×7): 50 ug via ORAL
  Filled 2022-01-09 (×7): qty 1

## 2022-01-09 MED ORDER — FENTANYL CITRATE PF 50 MCG/ML IJ SOSY
50.0000 ug | PREFILLED_SYRINGE | INTRAMUSCULAR | Status: DC
  Administered 2022-01-09: 50 ug via INTRAVENOUS
  Filled 2022-01-09: qty 2

## 2022-01-09 MED ORDER — ORAL CARE MOUTH RINSE: 15.0000 mL | Freq: Once | OROMUCOSAL | Status: AC

## 2022-01-09 MED ORDER — 0.9 % SODIUM CHLORIDE (POUR BTL) OPTIME
TOPICAL | Status: DC | PRN
  Administered 2022-01-09: 1000 mL

## 2022-01-09 MED ORDER — BUPIVACAINE HCL (PF) 0.5 % IJ SOLN
INTRAMUSCULAR | Status: DC | PRN
  Administered 2022-01-09: 30 mL

## 2022-01-09 MED ORDER — MENTHOL 3 MG MT LOZG: 1.0000 | LOZENGE | OROMUCOSAL | Status: DC | PRN

## 2022-01-09 MED ORDER — BUPIVACAINE HCL (PF) 0.25 % IJ SOLN
INTRAMUSCULAR | Status: AC
  Filled 2022-01-09: qty 30

## 2022-01-09 MED ORDER — PROPOFOL 500 MG/50ML IV EMUL
INTRAVENOUS | Status: DC | PRN
  Administered 2022-01-09: 50 ug/kg/min via INTRAVENOUS

## 2022-01-09 MED ORDER — VANCOMYCIN HCL IN DEXTROSE 1-5 GM/200ML-% IV SOLN
1000.0000 mg | Freq: Two times a day (BID) | INTRAVENOUS | Status: AC
  Administered 2022-01-09: 1000 mg via INTRAVENOUS
  Filled 2022-01-09: qty 200

## 2022-01-09 MED ORDER — DEXAMETHASONE SODIUM PHOSPHATE 10 MG/ML IJ SOLN
INTRAMUSCULAR | Status: DC | PRN
  Administered 2022-01-09: 10 mg via INTRAVENOUS

## 2022-01-09 MED ORDER — ONDANSETRON HCL 4 MG PO TABS: 4.0000 mg | ORAL_TABLET | Freq: Four times a day (QID) | ORAL | Status: DC | PRN

## 2022-01-09 MED ORDER — METHOCARBAMOL 500 MG IVPB - SIMPLE MED
INTRAVENOUS | Status: AC
  Filled 2022-01-09: qty 55

## 2022-01-09 MED ORDER — DEXAMETHASONE SODIUM PHOSPHATE 10 MG/ML IJ SOLN
INTRAMUSCULAR | Status: AC
  Filled 2022-01-09: qty 1

## 2022-01-09 MED ORDER — LIDOCAINE HCL (PF) 2 % IJ SOLN
INTRAMUSCULAR | Status: AC
  Filled 2022-01-09: qty 5

## 2022-01-09 SURGICAL SUPPLY — 57 items
BAG COUNTER SPONGE SURGICOUNT (BAG) IMPLANT
BAG ZIPLOCK 12X15 (MISCELLANEOUS) ×1 IMPLANT
BENZOIN TINCTURE PRP APPL 2/3 (GAUZE/BANDAGES/DRESSINGS) IMPLANT
BLADE SAG 18X100X1.27 (BLADE) ×1 IMPLANT
BLADE SURG SZ10 CARB STEEL (BLADE) ×2 IMPLANT
BNDG ELASTIC 3X5.8 VLCR STR LF (GAUZE/BANDAGES/DRESSINGS) IMPLANT
BNDG ELASTIC 6X5.8 VLCR STR LF (GAUZE/BANDAGES/DRESSINGS) ×2 IMPLANT
BOWL SMART MIX CTS (DISPOSABLE) IMPLANT
COMP FEM STD PS KNEE 7 RT (Joint) ×1 IMPLANT
COMPONENT FEM STD PS KNEE 7 RT (Joint) IMPLANT
COOLER ICEMAN CLASSIC (MISCELLANEOUS) ×1 IMPLANT
COVER SURGICAL LIGHT HANDLE (MISCELLANEOUS) ×1 IMPLANT
CUFF TOURN SGL QUICK 34 (TOURNIQUET CUFF) ×1
CUFF TRNQT CYL 34X4.125X (TOURNIQUET CUFF) ×1 IMPLANT
DRAPE INCISE IOBAN 66X45 STRL (DRAPES) ×1 IMPLANT
DRAPE U-SHAPE 47X51 STRL (DRAPES) ×1 IMPLANT
DURAPREP 26ML APPLICATOR (WOUND CARE) ×1 IMPLANT
ELECT BLADE TIP CTD 4 INCH (ELECTRODE) ×1 IMPLANT
ELECT REM PT RETURN 15FT ADLT (MISCELLANEOUS) ×1 IMPLANT
GAUZE PAD ABD 8X10 STRL (GAUZE/BANDAGES/DRESSINGS) ×2 IMPLANT
GAUZE SPONGE 4X4 12PLY STRL (GAUZE/BANDAGES/DRESSINGS) ×1 IMPLANT
GAUZE XEROFORM 1X8 LF (GAUZE/BANDAGES/DRESSINGS) IMPLANT
GLOVE BIO SURGEON STRL SZ7.5 (GLOVE) ×1 IMPLANT
GLOVE BIOGEL PI IND STRL 8 (GLOVE) ×2 IMPLANT
GLOVE ECLIPSE 8.0 STRL XLNG CF (GLOVE) ×1 IMPLANT
GOWN STRL REUS W/ TWL XL LVL3 (GOWN DISPOSABLE) ×2 IMPLANT
GOWN STRL REUS W/TWL XL LVL3 (GOWN DISPOSABLE) ×2
HANDPIECE INTERPULSE COAX TIP (DISPOSABLE) ×1
HDLS TROCR DRIL PIN KNEE 75 (PIN) ×1
HOLDER FOLEY CATH W/STRAP (MISCELLANEOUS) IMPLANT
IMMOBILIZER KNEE 20 (SOFTGOODS) ×1
IMMOBILIZER KNEE 20 THIGH 36 (SOFTGOODS) ×1 IMPLANT
IMPL PATELLA METAL SZ32X10 (Joint) IMPLANT
KIT TURNOVER KIT A (KITS) IMPLANT
LINER ASF PERS 10X6/7 CD RT (Liner) IMPLANT
NS IRRIG 1000ML POUR BTL (IV SOLUTION) ×1 IMPLANT
PACK TOTAL KNEE CUSTOM (KITS) ×1 IMPLANT
PAD COLD SHLDR WRAP-ON (PAD) ×1 IMPLANT
PADDING CAST COTTON 6X4 STRL (CAST SUPPLIES) ×2 IMPLANT
PIN DRILL HDLS TROCAR 75 4PK (PIN) IMPLANT
PROTECTOR NERVE ULNAR (MISCELLANEOUS) ×1 IMPLANT
SCREW FEMALE HEX FIX 25X2.5 (ORTHOPEDIC DISPOSABLE SUPPLIES) IMPLANT
SET HNDPC FAN SPRY TIP SCT (DISPOSABLE) ×1 IMPLANT
SET PAD KNEE POSITIONER (MISCELLANEOUS) ×1 IMPLANT
SPIKE FLUID TRANSFER (MISCELLANEOUS) IMPLANT
STAPLER VISISTAT 35W (STAPLE) IMPLANT
STEM TIB PS KNEE D 0D RT (Stem) IMPLANT
STRIP CLOSURE SKIN 1/2X4 (GAUZE/BANDAGES/DRESSINGS) IMPLANT
SUT MNCRL AB 4-0 PS2 18 (SUTURE) IMPLANT
SUT VIC AB 0 CT1 27 (SUTURE) ×1
SUT VIC AB 0 CT1 27XBRD ANTBC (SUTURE) ×1 IMPLANT
SUT VIC AB 1 CT1 36 (SUTURE) ×2 IMPLANT
SUT VIC AB 2-0 CT1 27 (SUTURE) ×2
SUT VIC AB 2-0 CT1 TAPERPNT 27 (SUTURE) ×2 IMPLANT
TRAY FOLEY MTR SLVR 16FR STAT (SET/KITS/TRAYS/PACK) IMPLANT
WATER STERILE IRR 1000ML POUR (IV SOLUTION) ×2 IMPLANT
WRAP KNEE MAXI GEL POST OP (GAUZE/BANDAGES/DRESSINGS) IMPLANT

## 2022-01-09 NOTE — Anesthesia Postprocedure Evaluation (Signed)
Anesthesia Post Note  Patient: Sierra Perez  Procedure(s) Performed: RIGHT TOTAL KNEE ARTHROPLASTY (Right: Knee)     Patient location during evaluation: Nursing Unit Anesthesia Type: Regional Level of consciousness: oriented and awake and alert Pain management: pain level controlled Vital Signs Assessment: post-procedure vital signs reviewed and stable Respiratory status: spontaneous breathing and respiratory function stable Cardiovascular status: blood pressure returned to baseline and stable Postop Assessment: no headache, no backache, no apparent nausea or vomiting and patient able to bend at knees Anesthetic complications: no  No notable events documented.  Last Vitals:  Vitals:   01/09/22 1430 01/09/22 1445  BP: 98/61 100/60  Pulse: 63 66  Resp: 14 13  Temp:    SpO2: 100% 100%    Last Pain:  Vitals:   01/09/22 1445  TempSrc:   PainSc: 3                  Barnet Glasgow

## 2022-01-09 NOTE — Anesthesia Procedure Notes (Signed)
Anesthesia Regional Block: Adductor canal block   Pre-Anesthetic Checklist: , timeout performed,  Correct Patient, Correct Site, Correct Laterality,  Correct Procedure, Correct Position, site marked,  Risks and benefits discussed,  Surgical consent,  Pre-op evaluation,  At surgeon's request and post-op pain management  Laterality: Lower and Right  Prep: chloraprep       Needles:  Injection technique: Single-shot  Needle Type: Echogenic Needle     Needle Length: 9cm  Needle Gauge: 22     Additional Needles:   Procedures:,,,, ultrasound used (permanent image in chart),,    Narrative:  Start time: 01/09/2022 10:33 AM End time: 01/09/2022 10:40 AM Injection made incrementally with aspirations every 5 mL.  Performed by: Personally  Anesthesiologist: Barnet Glasgow, MD  Additional Notes: Block assessed prior to surgery. Pt tolerated procedure well.

## 2022-01-09 NOTE — Interval H&P Note (Signed)
History and Physical Interval Note: The patient understands that she is here today for right knee replacement to treat her severe right knee osteoarthritis.  There is been no acute or interval change in her medical status.  See H&P.  The risks and benefits of surgery have been explained in detail and informed consent is obtained.  The right operative knee has been marked.  01/09/2022 9:50 AM  Sierra Perez  has presented today for surgery, with the diagnosis of OSTEOARTHRITIS RIGHT KNEE.  The various methods of treatment have been discussed with the patient and family. After consideration of risks, benefits and other options for treatment, the patient has consented to  Procedure(s): RIGHT TOTAL KNEE ARTHROPLASTY (Right) as a surgical intervention.  The patient's history has been reviewed, patient examined, no change in status, stable for surgery.  I have reviewed the patient's chart and labs.  Questions were answered to the patient's satisfaction.     Mcarthur Rossetti

## 2022-01-09 NOTE — Transfer of Care (Signed)
Immediate Anesthesia Transfer of Care Note  Patient: Sierra Perez  Procedure(s) Performed: RIGHT TOTAL KNEE ARTHROPLASTY (Right: Knee)  Patient Location: PACU  Anesthesia Type:Spinal  Level of Consciousness: awake and alert   Airway & Oxygen Therapy: Patient Spontanous Breathing and Patient connected to face mask oxygen  Post-op Assessment: Report given to RN and Post -op Vital signs reviewed and stable  Post vital signs: Reviewed and stable  Last Vitals:  Vitals Value Taken Time  BP    Temp    Pulse    Resp 15 01/09/22 1253  SpO2    Vitals shown include unvalidated device data.  Last Pain:  Vitals:   01/09/22 1045  TempSrc:   PainSc: 0-No pain         Complications: No notable events documented.

## 2022-01-09 NOTE — Op Note (Signed)
Operative Note  Date of operation: 01/09/2022 Preoperative diagnosis: Right knee osteoarthritis and degenerative joint disease Postoperative diagnosis: Same  Procedure: Right press-fit total knee arthroplasty  Implants: Biomet/Zimmer persona press-fit knee system with size 7 standard right CR femur, size D right tibial tray, 10 mm thickness medial congruent right polythene insert, 32 mm patella button  Surgeon: Lind Guest. Ninfa Linden, MD Assistant: Benita Stabile, PA-C  Anesthesia: #1 lower extremity regional block, #2 spinal, #3 local Tourniquet time: Under 1 hour Antibiotics: 2 g IV Ancef Blood loss: Less than 119 cc Complications: None  Indications: The patient is a pleasant 61 year old female well-known to me.  She unfortunately has developed debilitating arthritis involving her right knee.  She had arthroscopic intervention of that knee many years ago done elsewhere and unfortunately developed RSD after that arthroscopy.  That is since resolved.  She has also had a history of a left total knee arthroplasty and a right total hip arthroplasty.  She has a history of DVTs and is on chronic blood thinning medication and has a [place] filter.  She has tried and failed all forms of conservative treatment for her right knee and at this point given the daily pain she has with her right knee which is 10 out of 10 combined with the decreased mobility and detrimental effect that is set on her quality of life, she does wish to proceed with a total knee arthroplasty on the right side.  We talked in length in detail about the surgery.  Having had it before on the left side she is fully aware of the risks of acute blood loss anemia, nerve vessel injury, fracture, infection, DVT, implant failure and wound healing issues.  She understands her goals are hopefully decrease pain, improve mobility, and improve quality of life.  Procedure description: After informed consent was obtained and the appropriate  right knee was marked, anesthesia obtained a right lower extremity regional block in the holding room.  The patient was then brought to the operating room and set up on the operating table where spinal anesthesia was obtained she was then laid in supine position the operating table and a Foley catheter was placed.  A nonsterile tourniquet is placed around her upper right thigh and her right thigh, knee, leg, ankle and foot were prepped and draped in DuraPrep and sterile drapes including sterile stockinette.  A timeout was called and she was then applied as the correct patient the correct right knee.  An Esmarch was then used to wrap of the leg and strength was plated to 300 mm of pressure.  A direct midline incision was carried over the patella and this was taken proximally distally.  Dissection was carried down to the knee joint and a medial parapatellar arthrotomy was carried out.  A large joint effusion was encountered and significant synovitis in the knee.  With the knee in a flexed position we found osteophytes and cartilage wear in all 3 compartments.  We removed osteophytes in all 3 compartments as well as remnants of the ACL, medial and lateral meniscus.  We then used the extramedullary cutting guide for making her proximal tibia cut correction for varus and valgus and a 3 degree slope.  We made this cut without difficulty.  We then went to the femur side using an intramedullary guide for distal femoral cut setting this for right knee at 5 degrees external rotated and 10 mm distal femoral cut.  We actually then backed this down 2 more millimeters.  We  then brought the knee back down full extension and had achieved full extension with a 10 mm extension block.  Attention was then turned back to the femur.  We put the femoral sizing guide based off of the epicondylar axis and Whitesides line.  Based off of this we chose a size 7 femur.  We put a 4-in-1 cutting block for size 7 femur and made her anterior and  posterior cuts followed by her chamfer cuts.  We then went back to the tibia and chose a size D right tibial tray for coverage over the tibial plateau setting the rotation of the tibial tubercle and the femur.  We did our keel punch and drill hole off of this all for press-fit implants given the good quality of her bone.  We then trialed our size D right tibial tray followed by our size 7 right CR standard femur.  We placed a 10 mm right medial congruent fixed-bearing polythene insert and we are pleased with range of motion and stability of the knee without insert.  We then made a patella cut and drilled a single hole for a size 32 press-fit patella button.  We then removed all instrumentation from the knee and irrigate the knee with normal saline solution using pulsatile lavage.  We dried the knee well and then placed our local Marcaine that was plain around the arthrotomy.  With the knee in a flexed position we then placed our press-fit Biomet/Zimmer persona tibial tray for right knee size D followed by press fitting our size 7 right CR standard femur.  We placed our medial congruent 10 mm thickness right polythene insert and fit our size 32 patella button.  After all implants were in the knee we put her through several cycles of range of motion and we are pleased with range of motion and stability.  We then let the tourniquet down and hemostasis was obtained electrocautery.  The arthrotomy was closed with interrupted #1 Vicryl suture followed by 0 Vicryl's deep tissue and 2-0 Vicryl close the subcutaneous tissue.  The skin was closed with staples.  Well-padded sterile dressing was applied.  She was taken recovery room in stable addition with all final counts being correct and no complications noted.  Benita Stabile, PA-C did assist in the entire case from beginning and his assistance was medically necessary and crucial for soft tissue retraction and management as well as helping guide implant placement and a layered  closure of the wound.

## 2022-01-09 NOTE — Anesthesia Procedure Notes (Signed)
Spinal  Patient location during procedure: OR Start time: 01/09/2022 11:10 AM End time: 01/09/2022 11:31 AM Reason for block: surgical anesthesia Staffing Performed: resident/CRNA  Resident/CRNA: Sharlette Dense, CRNA Performed by: Sharlette Dense, CRNA Authorized by: Barnet Glasgow, MD   Preanesthetic Checklist Completed: patient identified, IV checked, site marked, risks and benefits discussed, surgical consent, monitors and equipment checked, pre-op evaluation and timeout performed Spinal Block Patient position: sitting Prep: DuraPrep Patient monitoring: heart rate, cardiac monitor, continuous pulse ox and blood pressure Approach: midline Location: L4-5 Injection technique: single-shot Needle Needle type: Pencan  Needle gauge: 24 G Needle length: 10 cm Assessment Sensory level: T4 Events: CSF return Additional Notes Kit expiration date 01/02/2024 and lot # [id number] Clear free flow CSF, negative heme, negative paresthesia Tolerated well and returned to supine position

## 2022-01-09 NOTE — Evaluation (Signed)
Physical Therapy Evaluation Patient Details Name: Sierra Perez MRN: [id number] DOB: 1960-05-23 Today's Date: 01/09/2022  History of Present Illness  Pt s/p R TKR and with hx of DM, MVP, Fibromyalgia, L TKR, R THR  Clinical Impression  Pt s/p R TKR and presents with decreased R LE strength/ROM and post op pain limiting functional mobility.  Pt hopes to dc to SNF level rehab to maximize IND and safety prior to return home with very limited assist.     Recommendations for follow up therapy are one component of a multi-disciplinary discharge planning process, led by the attending physician.  Recommendations may be updated based on patient status, additional functional criteria and insurance authorization.  Follow Up Recommendations Skilled nursing-short term rehab (<3 hours/day) Can patient physically be transported by private vehicle: Yes    Assistance Recommended at Discharge Frequent or constant Supervision/Assistance  Patient can return home with the following  A little help with walking and/or transfers;A little help with bathing/dressing/bathroom;Assistance with cooking/housework;Assist for transportation;Help with stairs or ramp for entrance    Equipment Recommendations None recommended by PT  Recommendations for Other Services       Functional Status Assessment Patient has had a recent decline in their functional status and demonstrates the ability to make significant improvements in function in a reasonable and predictable amount of time.     Precautions / Restrictions Precautions Precautions: Fall;Knee Required Braces or Orthoses: Knee Immobilizer - Right Knee Immobilizer - Right: Discontinue once straight leg raise with < 10 degree lag Restrictions Weight Bearing Restrictions: No Other Position/Activity Restrictions: WBAT      Mobility  Bed Mobility Overal bed mobility: Needs Assistance Bed Mobility: Supine to Sit     Supine to sit: Min assist     General bed  mobility comments: Increased time with cues for cues for sequence    Transfers Overall transfer level: Needs assistance Equipment used: Rolling walker (2 wheels) Transfers: Sit to/from Stand Sit to Stand: Min assist           General transfer comment: cues for LE management and use of UEs to self assist    Ambulation/Gait Ambulation/Gait assistance: Min assist Gait Distance (Feet): 42 Feet Assistive device: Rolling walker (2 wheels) Gait Pattern/deviations: Step-to pattern, Decreased step length - right, Decreased step length - left, Shuffle, Trunk flexed Gait velocity: decr     General Gait Details: Increased time with cues for posture, sequence and position from [employer] Mobility    Modified Rankin (Stroke Patients Only)       Balance Overall balance assessment: Mild deficits observed, not formally tested                                           Pertinent Vitals/Pain Pain Assessment Pain Assessment: 0-10 Pain Score: 7  Pain Location: R knee Pain Descriptors / Indicators: Aching, Sore Pain Intervention(s): Limited activity within patient's tolerance, Monitored during session, Premedicated before session, Ice applied    Home Living Family/patient expects to be discharged to:: Skilled nursing facility Living Arrangements: Alone Available Help at Discharge: Friend(s);Available PRN/intermittently Type of Home: House Home Access: Stairs to enter Entrance Stairs-Rails: Right;Left Entrance Stairs-Number of Steps: 3   Home Layout: Multi-level;Able to live on main level with bedroom/bathroom Home Equipment: Rolling Walker (2 wheels);Cane - single point;Grab bars -  tub/shower      Prior Function Prior Level of Function : Independent/Modified Independent             Mobility Comments: Pt with limited ambulation and using RW for several weeks prior to this admit       Hand Dominance         Extremity/Trunk Assessment   Upper Extremity Assessment Upper Extremity Assessment: Overall WFL for tasks assessed    Lower Extremity Assessment Lower Extremity Assessment: RLE deficits/detail    Cervical / Trunk Assessment Cervical / Trunk Assessment: Normal  Communication      Cognition Arousal/Alertness: Awake/alert Behavior During Therapy: WFL for tasks assessed/performed Overall Cognitive Status: Within Functional Limits for tasks assessed                                          General Comments      Exercises Total Joint Exercises Ankle Circles/Pumps: AROM, Both, 15 reps, Supine   Assessment/Plan    PT Assessment Patient needs continued PT services  PT Problem List Decreased strength;Decreased range of motion;Decreased activity tolerance;Decreased balance;Decreased mobility;Decreased knowledge of use of DME;Pain       PT Treatment Interventions DME instruction;Gait training;Stair training;Functional mobility training;Therapeutic activities;Therapeutic exercise;Patient/family education    PT Goals (Current goals can be found in the Care Plan section)  Acute Rehab PT Goals Patient Stated Goal: REgain IND PT Goal Formulation: With patient Time For Goal Achievement: 01/16/22 Potential to Achieve Goals: Good    Frequency 7X/week     Co-evaluation               AM-PAC PT "6 Clicks" Mobility  Outcome Measure Help needed turning from your back to your side while in a flat bed without using bedrails?: A Little Help needed moving from lying on your back to sitting on the side of a flat bed without using bedrails?: A Little Help needed moving to and from a bed to a chair (including a wheelchair)?: A Little Help needed standing up from a chair using your arms (e.g., wheelchair or bedside chair)?: A Little Help needed to walk in hospital room?: A Little Help needed climbing 3-5 steps with a railing? : A Lot 6 Click Score: 17    End of  Session Equipment Utilized During Treatment: Gait belt;Right knee immobilizer Activity Tolerance: Patient tolerated treatment well Patient left: in chair;with call bell/phone within reach;with family/visitor present Nurse Communication: Mobility status PT Visit Diagnosis: Difficulty in walking, not elsewhere classified (R26.2)    Time: 1850-1935 PT Time Calculation (min) (ACUTE ONLY): 45 min   Charges:   PT Evaluation $PT Eval Low Complexity: 1 Low PT Treatments $Gait Training: 8-22 mins        [place] Pager [phone] Office [phone]   Elim Peale 01/09/2022, 7:47 PM

## 2022-01-09 NOTE — TOC Initial Note (Signed)
Transition of Care [facility]) - Initial/Assessment Note    Patient Details  Name: Sierra Perez MRN: [id number] Date of Birth: 1960-10-21  [facility] CM/SW Contact:    Lennart Pall, LCSW Phone Number: 01/09/2022, 4:29 PM  Clinical Narrative:                 Met with pt today to introduce TOC/ CSW role with dc planning needs.  Pt reports that she lives alone and does have some concern about being able to manage independently at home.  She is aware that she has been referred/ accepted by [facility] for HHPT follow up, however, she has spoken with MD about possibility she may need short term SNF prior to going home.  I explained that she will want to discuss all of this with therapies during the evaluations and, if short term SNF is the recommendation, then [facility] will assist with this plan.  Will alert weekend TOC coverage to follow up if possible.  Expected Discharge Plan: [place] (vs. SNF) Barriers to Discharge: Continued Medical Work up   Patient Goals and CMS Choice Patient states their goals for this hospitalization and ongoing recovery are:: anticipates need for SNF rehab prior to return home      Expected Discharge Plan and Services Expected Discharge Plan: [place] (vs. SNF) In-house Referral: Clinical Social Work     Living arrangements for the past 2 months: Single Family Home                                      Prior Living Arrangements/Services Living arrangements for the past 2 months: Single Family Home Lives with:: Self Patient language and need for interpreter reviewed:: Yes Do you feel safe going back to the place where you live?: Yes      Need for Family Participation in Patient Care: Yes (Comment) Care giver support system in place?: No (comment)   Criminal Activity/Legal Involvement Pertinent to Current Situation/Hospitalization: No - Comment as needed  Activities of Daily Living Home Assistive  Devices/Equipment: Cane (specify quad or straight), Grab bars in shower, CPAP, Hand-held shower hose, Shower chair with back, Raised toilet seat with rails ADL Screening (condition at time of admission) Patient's cognitive ability adequate to safely complete daily activities?: Yes Is the patient deaf or have difficulty hearing?: No Does the patient have difficulty seeing, even when wearing glasses/contacts?: No Does the patient have difficulty concentrating, remembering, or making decisions?: No Patient able to express need for assistance with ADLs?: Yes Does the patient have difficulty dressing or bathing?: No Independently performs ADLs?: Yes (appropriate for developmental age) Does the patient have difficulty walking or climbing stairs?: No Weakness of Legs: Right Weakness of Arms/Hands: None  Permission Sought/Granted                  Emotional Assessment Appearance:: Appears stated age Attitude/Demeanor/Rapport: Engaged, Gracious Affect (typically observed): Accepting Orientation: : Oriented to Self, Oriented to Place, Oriented to  Time, Oriented to Situation Alcohol / Substance Use: Not Applicable Psych Involvement: No (comment)  Admission diagnosis:  Status post total right knee replacement [Z96.651] Patient Active Problem List   Diagnosis Date Noted   Status post total right knee replacement 01/09/2022   Obesity (BMI 30-39.9) 06/23/2021   Iron deficiency anemia 01/23/2021   Status post right hip replacement 12/03/2020   Status post total  replacement of right hip 11/19/2020   Breast cancer ([place]) 07/08/2020   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   H/O total hysterectomy 06/11/2020   Genetic testing 05/27/2020   Chronic superficial gastritis 05/27/2020   Fibromyalgia 05/27/2020   GERD (gastroesophageal reflux disease) 05/27/2020   Prediabetes 05/27/2020   Complex regional pain syndrome I, unspecified 05/27/2020   Atypical lobular hyperplasia [facility]) of left breast  05/23/2020   History of therapeutic radiation 05/23/2020   Family history of breast cancer    Family history of colon cancer    Personal history of malignant neoplasm of breast    Unilateral primary osteoarthritis, right knee 02/26/2020   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   DVT (deep venous thrombosis) ([place]) 05/04/2018   Malignant neoplasm of upper-outer quadrant of right breast in female, estrogen receptor negative ([place]) 05/03/2018   Obstructive sleep apnea (adult) (pediatric) 09/14/2017   Adenopathy 07/15/2017   B12 deficiency 07/15/2017   Disorder of tympanic membrane of right ear 07/15/2017   Recurrent UTI 03/09/2017   Bladder pain 02/25/2017   Proteinuria 02/25/2017   Hematoma of thigh, left, subsequent encounter 08/16/2016   At risk for obstructive sleep apnea 07/01/2016   Suspected sleep apnea 07/01/2016   Arthritis of left knee 05/26/2016   Localized primary osteoarthritis of lower leg, left 05/26/2016   Clotting disorder ([place]) 05/22/2016   Chronic anticoagulation 01/06/2016   Right-sided epistaxis 01/06/2016   Urinary tract infection, site not specified 08/12/2015   Abnormal Pap smear of vagina 05/06/2015   Follow-up examination, following other surgery 12/14/2013   Pelvic and perineal pain 11/28/2013   Anxiety and depression 11/16/2013   H/O bariatric surgery 11/16/2013   Atrophic vaginitis 11/13/2013   Tear of lateral cartilage or meniscus of knee, current 04/14/2013   Allergic rhinitis 01/16/2013   Female genital symptoms 11/10/2012   Symptomatic menopausal or female climacteric states 11/10/2012   Disequilibrium 06/09/2012   Morbid obesity ([place]) 02/09/2012   Vitamin D deficiency 09/13/2008   Preop examination 04/20/2008   Hypothyroidism 05/20/2007   PCP:  Wenda Low, MD Pharmacy:   [facility] [place], [address] [place] [address] Phone: [phone] Fax:  [place], [address] AT [facility] [address] [facility] [place] [address] Phone: [phone] Fax: [phone]     Social Determinants of Health (SDOH) Interventions    Readmission Risk Interventions     No data to display

## 2022-01-10 DIAGNOSIS — M1711 Unilateral primary osteoarthritis, right knee: Secondary | ICD-10-CM | POA: Diagnosis not present

## 2022-01-10 LAB — BASIC METABOLIC PANEL
Anion gap: 6 (ref 5–15)
BUN: 9 mg/dL (ref 8–23)
CO2: 26 mmol/L (ref 22–32)
Calcium: 9.2 mg/dL (ref 8.9–10.3)
Chloride: 105 mmol/L (ref 98–111)
Creatinine, Ser: 0.68 mg/dL (ref 0.44–1.00)
GFR, Estimated: >60 mL/min (ref >60)
Glucose, Bld: 118 mg/dL — ABNORMAL HIGH (ref 70–99)
Potassium: 3.7 mmol/L (ref 3.5–5.1)
Sodium: 137 mmol/L (ref 135–145)

## 2022-01-10 LAB — CBC
HCT: 29.7 % — ABNORMAL LOW (ref 36.0–46.0)
Hemoglobin: 9.7 g/dL — ABNORMAL LOW (ref 12.0–15.0)
MCH: 29 pg (ref 26.0–34.0)
MCHC: 32.7 g/dL (ref 30.0–36.0)
MCV: 88.9 fL (ref 80.0–100.0)
Platelets: 405 10*3/uL — ABNORMAL HIGH (ref 150–400)
RBC: 3.34 MIL/uL — ABNORMAL LOW (ref 3.87–5.11)
RDW: 12.7 % (ref 11.5–15.5)
WBC: 13.9 10*3/uL — ABNORMAL HIGH (ref 4.0–10.5)
nRBC: 0 % (ref 0.0–0.2)

## 2022-01-10 LAB — GLUCOSE, CAPILLARY: Glucose-Capillary: 120 mg/dL — ABNORMAL HIGH (ref 70–99)

## 2022-01-10 MED ORDER — HYDROMORPHONE HCL 2 MG PO TABS
4.0000 mg | ORAL_TABLET | ORAL | Status: DC | PRN
  Administered 2022-01-10 – 2022-01-14 (×16): 4 mg via ORAL
  Filled 2022-01-10 (×17): qty 2

## 2022-01-10 MED ORDER — ANASTROZOLE 1 MG PO TABS
1.0000 mg | ORAL_TABLET | Freq: Every day | ORAL | Status: DC
  Administered 2022-01-10 – 2022-01-15 (×6): 1 mg via ORAL
  Filled 2022-01-10 (×6): qty 1

## 2022-01-10 NOTE — Progress Notes (Signed)
Physical Therapy Treatment Patient Details Name: Sierra Perez MRN: [id number] DOB: 1960/12/16 Today's Date: 01/10/2022   History of Present Illness Pt s/p R TKR and with hx of DM, MVP, Fibromyalgia, L TKR, R THR    PT Comments    Pt continues very cooperative and progressing steadily with mobility despite c/o increased pain level.   Recommendations for follow up therapy are one component of a multi-disciplinary discharge planning process, led by the attending physician.  Recommendations may be updated based on patient status, additional functional criteria and insurance authorization.  Follow Up Recommendations  Skilled nursing-short term rehab (<3 hours/day) Can patient physically be transported by private vehicle: Yes   Assistance Recommended at Discharge Frequent or constant Supervision/Assistance  Patient can return home with the following A little help with walking and/or transfers;A little help with bathing/dressing/bathroom;Assistance with cooking/housework;Assist for transportation;Help with stairs or ramp for entrance   Equipment Recommendations  None recommended by PT    Recommendations for Other Services       Precautions / Restrictions Precautions Precautions: Fall;Knee Required Braces or Orthoses: Knee Immobilizer - Right Knee Immobilizer - Right: Discontinue once straight leg raise with < 10 degree lag Restrictions Weight Bearing Restrictions: No Other Position/Activity Restrictions: WBAT     Mobility  Bed Mobility Overal bed mobility: Needs Assistance Bed Mobility: Supine to Sit, Sit to Supine     Supine to sit: Min assist Sit to supine: Min assist   General bed mobility comments: Increased time with min assist for R LE    Transfers Overall transfer level: Needs assistance Equipment used: Rolling walker (2 wheels) Transfers: Sit to/from Stand Sit to Stand: Min assist, Min guard           General transfer comment: cues for LE management and use  of UEs to self assist    Ambulation/Gait Ambulation/Gait assistance: Min assist, Min guard Gait Distance (Feet): 45 Feet (45' twice) Assistive device: Rolling walker (2 wheels) Gait Pattern/deviations: Step-to pattern, Decreased step length - right, Decreased step length - left, Shuffle, Trunk flexed Gait velocity: decr     General Gait Details: Increased time with cues for posture, sequence and position from [employer] Mobility    Modified Rankin (Stroke Patients Only)       Balance Overall balance assessment: Mild deficits observed, not formally tested                                          Cognition Arousal/Alertness: Awake/alert Behavior During Therapy: WFL for tasks assessed/performed Overall Cognitive Status: Within Functional Limits for tasks assessed                                          Exercises Total Joint Exercises Ankle Circles/Pumps: AROM, Both, 15 reps, Supine Quad Sets: AROM, Both, 15 reps, Supine Heel Slides: AAROM, Right, 15 reps, Supine Straight Leg Raises: AAROM, Right, 10 reps, Supine    General Comments        Pertinent Vitals/Pain Pain Assessment Pain Assessment: 0-10 Pain Score: 6  Pain Location: R knee Pain Descriptors / Indicators: Aching, Sore Pain Intervention(s): Limited activity within patient's tolerance, Monitored during session, Premedicated before session, Ice applied    Home  Living                          Prior Function            PT Goals (current goals can now be found in the care plan section) Acute Rehab PT Goals Patient Stated Goal: REgain IND PT Goal Formulation: With patient Time For Goal Achievement: 01/16/22 Potential to Achieve Goals: Good Progress towards PT goals: Progressing toward goals    Frequency    7X/week      PT Plan Current plan remains appropriate    Co-evaluation              AM-PAC PT "6  Clicks" Mobility   Outcome Measure  Help needed turning from your back to your side while in a flat bed without using bedrails?: A Little Help needed moving from lying on your back to sitting on the side of a flat bed without using bedrails?: A Little Help needed moving to and from a bed to a chair (including a wheelchair)?: A Little Help needed standing up from a chair using your arms (e.g., wheelchair or bedside chair)?: A Little Help needed to walk in hospital room?: A Little Help needed climbing 3-5 steps with a railing? : A Lot 6 Click Score: 17    End of Session Equipment Utilized During Treatment: Gait belt;Right knee immobilizer Activity Tolerance: Patient tolerated treatment well;Patient limited by pain Patient left: in bed;with call bell/phone within reach Nurse Communication: Mobility status PT Visit Diagnosis: Difficulty in walking, not elsewhere classified (R26.2)     Time: 0258-5277 PT Time Calculation (min) (ACUTE ONLY): 29 min  Charges:  $Gait Training: 23-37 mins $Therapeutic Exercise: 8-22 mins                     [place] Pager [phone] Office [phone]    Vedanth Sirico 01/10/2022, 4:54 PM

## 2022-01-10 NOTE — Progress Notes (Signed)
Physical Therapy Treatment Patient Details Name: Sierra Perez MRN: [id number] DOB: Jun 21, 1960 Today's Date: 01/10/2022   History of Present Illness Pt s/p R TKR and with hx of DM, MVP, Fibromyalgia, L TKR, R THR    PT Comments    Pt continues cooperative and agreeable to initiate therex program.  OOB deferred to pm, pt just back to bed from toileting with nursing.  Recommendations for follow up therapy are one component of a multi-disciplinary discharge planning process, led by the attending physician.  Recommendations may be updated based on patient status, additional functional criteria and insurance authorization.  Follow Up Recommendations  Skilled nursing-short term rehab (<3 hours/day) Can patient physically be transported by private vehicle: Yes   Assistance Recommended at Discharge Frequent or constant Supervision/Assistance  Patient can return home with the following A little help with walking and/or transfers;A little help with bathing/dressing/bathroom;Assistance with cooking/housework;Assist for transportation;Help with stairs or ramp for entrance   Equipment Recommendations  None recommended by PT    Recommendations for Other Services       Precautions / Restrictions Precautions Precautions: Fall;Knee Required Braces or Orthoses: Knee Immobilizer - Right Knee Immobilizer - Right: Discontinue once straight leg raise with < 10 degree lag Restrictions Weight Bearing Restrictions: No Other Position/Activity Restrictions: WBAT     Mobility  Bed Mobility               General bed mobility comments: deferred at pt request - pt just up with nursing for toileting    Transfers                        Ambulation/Gait                   Stairs             Wheelchair Mobility    Modified Rankin (Stroke Patients Only)       Balance                                            Cognition Arousal/Alertness:  Awake/alert Behavior During Therapy: WFL for tasks assessed/performed Overall Cognitive Status: Within Functional Limits for tasks assessed                                          Exercises Total Joint Exercises Ankle Circles/Pumps: AROM, Both, 15 reps, Supine Quad Sets: AROM, Both, 15 reps, Supine Heel Slides: AAROM, Right, 15 reps, Supine Straight Leg Raises: AAROM, Right, 10 reps, Supine    General Comments        Pertinent Vitals/Pain Pain Assessment Pain Assessment: 0-10 Pain Score: 7  Pain Location: R knee Pain Descriptors / Indicators: Aching, Sore Pain Intervention(s): Limited activity within patient's tolerance, Monitored during session, Premedicated before session, Ice applied    Home Living                          Prior Function            PT Goals (current goals can now be found in the care plan section) Acute Rehab PT Goals Patient Stated Goal: REgain IND PT Goal Formulation: With patient Time For Goal Achievement: 01/16/22 Potential to Achieve Goals: Good Progress  towards PT goals: Progressing toward goals    Frequency    7X/week      PT Plan Current plan remains appropriate    Co-evaluation              AM-PAC PT "6 Clicks" Mobility   Outcome Measure  Help needed turning from your back to your side while in a flat bed without using bedrails?: A Little Help needed moving from lying on your back to sitting on the side of a flat bed without using bedrails?: A Little Help needed moving to and from a bed to a chair (including a wheelchair)?: A Little Help needed standing up from a chair using your arms (e.g., wheelchair or bedside chair)?: A Little Help needed to walk in hospital room?: A Little Help needed climbing 3-5 steps with a railing? : A Lot 6 Click Score: 17    End of Session   Activity Tolerance: Patient tolerated treatment well;Patient limited by pain Patient left: in bed;with call bell/phone  within reach Nurse Communication: Mobility status PT Visit Diagnosis: Difficulty in walking, not elsewhere classified (R26.2)     Time: 8891-6945 PT Time Calculation (min) (ACUTE ONLY): 25 min  Charges:  $Therapeutic Exercise: 8-22 mins                     Debe Coder PT Acute Rehabilitation Services Pager [phone] Office [phone]    Carolan Avedisian 01/10/2022, 1:38 PM

## 2022-01-10 NOTE — Progress Notes (Signed)
Patient ID: Sierra Perez, female   DOB: 19-Dec-1960, 61 y.o.   MRN: [id number] The patient is awake and alert this morning.  She has worked with physical therapy.  She is having significant pain which is to be expected given her knee replacement at her young age.  She has had a knee replacement for her left knee that it did eventually require manipulation under anesthesia.  Her vital signs are stable.  Her labs are stable.  I did change the dressing all over her left knee incision and the incision looks good.  Her calf is soft and she is able to flex and extend at her foot and ankle.  She lives completely alone with no family support or friends support in terms of someone that can be with her.  She is requesting short-term skilled nursing which is certainly appropriate given her social situation.  We will consult the transitional care team to look at options for her.  I did increase her pain medications which I feel are appropriate as well given her situation.  The plan will be to discharge to short-term skilled nursing at the first the week once a facility is found that she agrees to and that the transitional care team can find and get insurance approval for.

## 2022-01-10 NOTE — Discharge Instructions (Signed)

## 2022-01-11 DIAGNOSIS — M1711 Unilateral primary osteoarthritis, right knee: Secondary | ICD-10-CM | POA: Diagnosis not present

## 2022-01-11 NOTE — Progress Notes (Signed)
Physical Therapy Treatment Patient Details Name: Sierra Perez MRN: [id number] DOB: 1960/03/10 Today's Date: 01/11/2022   History of Present Illness Pt s/p R TKR and with hx of DM, MVP, Fibromyalgia, L TKR, R THR    PT Comments    Pt very cooperative and progressing steadily with mobility and with noted improved activity tolerance.  Pt performed therex program with assist, up to ambulate increased distance in hall and up to bathroom for toileting and hand hygiene at sink.   Recommendations for follow up therapy are one component of a multi-disciplinary discharge planning process, led by the attending physician.  Recommendations may be updated based on patient status, additional functional criteria and insurance authorization.  Follow Up Recommendations  Skilled nursing-short term rehab (<3 hours/day) Can patient physically be transported by private vehicle: Yes   Assistance Recommended at Discharge Frequent or constant Supervision/Assistance  Patient can return home with the following A little help with walking and/or transfers;A little help with bathing/dressing/bathroom;Assistance with cooking/housework;Assist for transportation;Help with stairs or ramp for entrance   Equipment Recommendations  None recommended by PT    Recommendations for Other Services       Precautions / Restrictions Precautions Precautions: Fall;Knee Required Braces or Orthoses: Knee Immobilizer - Right Knee Immobilizer - Right: Discontinue once straight leg raise with < 10 degree lag Restrictions Weight Bearing Restrictions: No Other Position/Activity Restrictions: WBAT     Mobility  Bed Mobility Overal bed mobility: Needs Assistance Bed Mobility: Supine to Sit     Supine to sit: Min guard     General bed mobility comments: Increased time with min guard for R LE    Transfers Overall transfer level: Needs assistance Equipment used: Rolling walker (2 wheels) Transfers: Sit to/from Stand Sit to  Stand: Min guard           General transfer comment: cues for LE management and use of UEs to self assist    Ambulation/Gait Ambulation/Gait assistance: Min guard Gait Distance (Feet): 95 Feet (and 15' back from bathroom) Assistive device: Rolling walker (2 wheels) Gait Pattern/deviations: Step-to pattern, Decreased step length - right, Decreased step length - left, Shuffle, Trunk flexed Gait velocity: decr     General Gait Details: Increased time with min cues for posture, sequence and position from [employer] Mobility    Modified Rankin (Stroke Patients Only)       Balance Overall balance assessment: Mild deficits observed, not formally tested                                          Cognition Arousal/Alertness: Awake/alert Behavior During Therapy: WFL for tasks assessed/performed Overall Cognitive Status: Within Functional Limits for tasks assessed                                          Exercises Total Joint Exercises Ankle Circles/Pumps: AROM, Both, 15 reps, Supine Quad Sets: AROM, Both, 15 reps, Supine Heel Slides: AAROM, Right, 15 reps, Supine Straight Leg Raises: AAROM, Right, Supine, 20 reps    General Comments        Pertinent Vitals/Pain Pain Assessment Pain Assessment: 0-10 Pain Score: 5  Pain Location: R knee Pain Descriptors / Indicators: Aching, Sore  Pain Intervention(s): Premedicated before session, Monitored during session, Limited activity within patient's tolerance, Ice applied    Home Living                          Prior Function            PT Goals (current goals can now be found in the care plan section) Acute Rehab PT Goals Patient Stated Goal: REgain IND PT Goal Formulation: With patient Time For Goal Achievement: 01/16/22 Potential to Achieve Goals: Good Progress towards PT goals: Progressing toward goals    Frequency    7X/week       PT Plan Current plan remains appropriate    Co-evaluation              AM-PAC PT "6 Clicks" Mobility   Outcome Measure  Help needed turning from your back to your side while in a flat bed without using bedrails?: A Little Help needed moving from lying on your back to sitting on the side of a flat bed without using bedrails?: A Little Help needed moving to and from a bed to a chair (including a wheelchair)?: A Little Help needed standing up from a chair using your arms (e.g., wheelchair or bedside chair)?: A Little Help needed to walk in hospital room?: A Little Help needed climbing 3-5 steps with a railing? : A Lot 6 Click Score: 17    End of Session Equipment Utilized During Treatment: Gait belt;Right knee immobilizer Activity Tolerance: Patient tolerated treatment well Patient left: in chair;with call bell/phone within reach;with chair alarm set Nurse Communication: Mobility status PT Visit Diagnosis: Difficulty in walking, not elsewhere classified (R26.2)     Time: 0093-8182 PT Time Calculation (min) (ACUTE ONLY): 44 min  Charges:  $Gait Training: 8-22 mins $Therapeutic Exercise: 8-22 mins $Therapeutic Activity: 8-22 mins                     [place] Pager [phone] Office [phone]    Sierra Perez 01/11/2022, 4:15 PM

## 2022-01-11 NOTE — Progress Notes (Signed)
Subjective: 2 Days Post-Op Procedure(s) (LRB): RIGHT TOTAL KNEE ARTHROPLASTY (Right) Patient reports pain as mild.  Resting comfortably  Objective: Vital signs in last 24 hours: Temp:  [98.5 F (36.9 C)-99.4 F (37.4 C)] 99.2 F (37.3 C) (12/10 0345) Pulse Rate:  [74-88] 88 (12/10 0345) Resp:  [16-18] 16 (12/10 0345) BP: (128-148)/(66-78) 128/66 (12/10 0345) SpO2:  [95 %-100 %] 95 % (12/10 0345)  Intake/Output from previous day: 12/09 0701 - 12/10 0700 In: 2388.4 [P.O.:660; I.V.:1728.4] Out: 1001 [Urine:1001] Intake/Output this shift: No intake/output data recorded.  Recent Labs    01/10/22 0503  HGB 9.7*   Recent Labs    01/10/22 0503  WBC 13.9*  RBC 3.34*  HCT 29.7*  PLT 405*   Recent Labs    01/10/22 0503  NA 137  K 3.7  CL 105  CO2 26  BUN 9  CREATININE 0.68  GLUCOSE 118*  CALCIUM 9.2   No results for input(s): "LABPT", "INR" in the last 72 hours.  Neurologically intact Neurovascular intact Sensation intact distally Intact pulses distally Dorsiflexion/Plantar flexion intact Incision: dressing C/D/I No cellulitis present Compartment soft   Assessment/Plan: 2 Days Post-Op Procedure(s) (LRB): RIGHT TOTAL KNEE ARTHROPLASTY (Right) Advance diet Up with therapy D/C IV fluids Discharge to SNF once insurance approves and bed is availbale WBAT RLE ABLA- mild and stable       Aundra Dubin 01/11/2022, 9:06 AM

## 2022-01-11 NOTE — Progress Notes (Signed)
Physical Therapy Treatment Patient Details Name: Sierra Perez MRN: [id number] DOB: Nov 25, 1960 Today's Date: 01/11/2022   History of Present Illness Pt s/p R TKR and with hx of DM, MVP, Fibromyalgia, L TKR, R THR    PT Comments    Pt continues cooperative with noted improvement in pain control and progressing steadily with mobility.   Recommendations for follow up therapy are one component of a multi-disciplinary discharge planning process, led by the attending physician.  Recommendations may be updated based on patient status, additional functional criteria and insurance authorization.  Follow Up Recommendations  Skilled nursing-short term rehab (<3 hours/day) Can patient physically be transported by private vehicle: Yes   Assistance Recommended at Discharge Frequent or constant Supervision/Assistance  Patient can return home with the following A little help with walking and/or transfers;A little help with bathing/dressing/bathroom;Assistance with cooking/housework;Assist for transportation;Help with stairs or ramp for entrance   Equipment Recommendations  None recommended by PT    Recommendations for Other Services       Precautions / Restrictions Precautions Precautions: Fall;Knee Required Braces or Orthoses: Knee Immobilizer - Right Knee Immobilizer - Right: Discontinue once straight leg raise with < 10 degree lag Restrictions Weight Bearing Restrictions: No Other Position/Activity Restrictions: WBAT     Mobility  Bed Mobility Overal bed mobility: Needs Assistance Bed Mobility: Sit to Supine     Supine to sit: Min guard Sit to supine: Min assist   General bed mobility comments: Increased time with min assist for R LE    Transfers Overall transfer level: Needs assistance Equipment used: Rolling walker (2 wheels) Transfers: Sit to/from Stand Sit to Stand: Min guard           General transfer comment: cues for LE management and use of UEs to self assist     Ambulation/Gait Ambulation/Gait assistance: Min guard Gait Distance (Feet): 95 Feet Assistive device: Rolling walker (2 wheels) Gait Pattern/deviations: Step-to pattern, Decreased step length - right, Decreased step length - left, Shuffle, Trunk flexed Gait velocity: decr     General Gait Details: Increased time with min cues for posture, sequence and position from [employer] Mobility    Modified Rankin (Stroke Patients Only)       Balance Overall balance assessment: Mild deficits observed, not formally tested                                          Cognition Arousal/Alertness: Awake/alert Behavior During Therapy: WFL for tasks assessed/performed Overall Cognitive Status: Within Functional Limits for tasks assessed                                          Exercises Total Joint Exercises Ankle Circles/Pumps: AROM, Both, 15 reps, Supine Quad Sets: AROM, Both, 15 reps, Supine Heel Slides: AAROM, Right, 15 reps, Supine Straight Leg Raises: AAROM, Right, Supine, 20 reps    General Comments        Pertinent Vitals/Pain Pain Assessment Pain Assessment: 0-10 Pain Score: 5  Pain Location: R knee Pain Descriptors / Indicators: Aching, Sore Pain Intervention(s): Limited activity within patient's tolerance, Monitored during session, Premedicated before session, Ice applied    Home Living  Prior Function            PT Goals (current goals can now be found in the care plan section) Acute Rehab PT Goals Patient Stated Goal: REgain IND PT Goal Formulation: With patient Time For Goal Achievement: 01/16/22 Potential to Achieve Goals: Good Progress towards PT goals: Progressing toward goals    Frequency    7X/week      PT Plan Current plan remains appropriate    Co-evaluation              AM-PAC PT "6 Clicks" Mobility   Outcome Measure  Help needed  turning from your back to your side while in a flat bed without using bedrails?: A Little Help needed moving from lying on your back to sitting on the side of a flat bed without using bedrails?: A Little Help needed moving to and from a bed to a chair (including a wheelchair)?: A Little Help needed standing up from a chair using your arms (e.g., wheelchair or bedside chair)?: A Little Help needed to walk in hospital room?: A Little Help needed climbing 3-5 steps with a railing? : A Lot 6 Click Score: 17    End of Session Equipment Utilized During Treatment: Gait belt;Right knee immobilizer Activity Tolerance: Patient tolerated treatment well Patient left: in bed;with call bell/phone within reach;with bed alarm set Nurse Communication: Mobility status PT Visit Diagnosis: Difficulty in walking, not elsewhere classified (R26.2)     Time: 9629-5284 PT Time Calculation (min) (ACUTE ONLY): 22 min  Charges:  $Gait Training: 8-22 mins $Therapeutic Exercise: 8-22 mins $Therapeutic Activity: 8-22 mins                     Debe Coder PT Acute Rehabilitation Services Pager [phone] Office [phone]    Sierra Perez 01/11/2022, 4:19 PM

## 2022-01-11 NOTE — Progress Notes (Signed)
Loss of IV site. Taking po's well. Managing pain with po pain medications. Elmyra Ricks, PA aware via phone. Received order to dc IVF's and to reinsert if needed.

## 2022-01-12 DIAGNOSIS — Z803 Family history of malignant neoplasm of breast: Secondary | ICD-10-CM | POA: Diagnosis not present

## 2022-01-12 DIAGNOSIS — Z86718 Personal history of other venous thrombosis and embolism: Secondary | ICD-10-CM | POA: Diagnosis not present

## 2022-01-12 DIAGNOSIS — G90521 Complex regional pain syndrome I of right lower limb: Secondary | ICD-10-CM | POA: Diagnosis present

## 2022-01-12 DIAGNOSIS — Z832 Family history of diseases of the blood and blood-forming organs and certain disorders involving the immune mechanism: Secondary | ICD-10-CM | POA: Diagnosis not present

## 2022-01-12 DIAGNOSIS — Z96652 Presence of left artificial knee joint: Secondary | ICD-10-CM | POA: Diagnosis present

## 2022-01-12 DIAGNOSIS — D62 Acute posthemorrhagic anemia: Secondary | ICD-10-CM | POA: Diagnosis not present

## 2022-01-12 DIAGNOSIS — M25561 Pain in right knee: Secondary | ICD-10-CM | POA: Diagnosis present

## 2022-01-12 DIAGNOSIS — Z8249 Family history of ischemic heart disease and other diseases of the circulatory system: Secondary | ICD-10-CM | POA: Diagnosis not present

## 2022-01-12 DIAGNOSIS — Z1501 Genetic susceptibility to malignant neoplasm of breast: Secondary | ICD-10-CM | POA: Diagnosis not present

## 2022-01-12 DIAGNOSIS — M1711 Unilateral primary osteoarthritis, right knee: Secondary | ICD-10-CM | POA: Diagnosis not present

## 2022-01-12 DIAGNOSIS — M659 Synovitis and tenosynovitis, unspecified: Secondary | ICD-10-CM | POA: Diagnosis present

## 2022-01-12 DIAGNOSIS — I341 Nonrheumatic mitral (valve) prolapse: Secondary | ICD-10-CM | POA: Diagnosis present

## 2022-01-12 DIAGNOSIS — D509 Iron deficiency anemia, unspecified: Secondary | ICD-10-CM | POA: Diagnosis present

## 2022-01-12 DIAGNOSIS — E119 Type 2 diabetes mellitus without complications: Secondary | ICD-10-CM | POA: Diagnosis present

## 2022-01-12 DIAGNOSIS — Z83719 Family history of colon polyps, unspecified: Secondary | ICD-10-CM | POA: Diagnosis not present

## 2022-01-12 DIAGNOSIS — Z833 Family history of diabetes mellitus: Secondary | ICD-10-CM | POA: Diagnosis not present

## 2022-01-12 DIAGNOSIS — J45909 Unspecified asthma, uncomplicated: Secondary | ICD-10-CM | POA: Diagnosis present

## 2022-01-12 DIAGNOSIS — I10 Essential (primary) hypertension: Secondary | ICD-10-CM | POA: Diagnosis present

## 2022-01-12 DIAGNOSIS — E039 Hypothyroidism, unspecified: Secondary | ICD-10-CM | POA: Diagnosis present

## 2022-01-12 DIAGNOSIS — E785 Hyperlipidemia, unspecified: Secondary | ICD-10-CM | POA: Diagnosis present

## 2022-01-12 DIAGNOSIS — F32A Depression, unspecified: Secondary | ICD-10-CM | POA: Diagnosis present

## 2022-01-12 DIAGNOSIS — D689 Coagulation defect, unspecified: Secondary | ICD-10-CM | POA: Diagnosis present

## 2022-01-12 DIAGNOSIS — M797 Fibromyalgia: Secondary | ICD-10-CM | POA: Diagnosis present

## 2022-01-12 DIAGNOSIS — Z853 Personal history of malignant neoplasm of breast: Secondary | ICD-10-CM | POA: Diagnosis not present

## 2022-01-12 DIAGNOSIS — Z96641 Presence of right artificial hip joint: Secondary | ICD-10-CM | POA: Diagnosis present

## 2022-01-12 MED ORDER — METHOCARBAMOL 500 MG PO TABS: 500.0000 mg | ORAL_TABLET | Freq: Four times a day (QID) | ORAL | 0 refills | Status: DC | PRN

## 2022-01-12 MED ORDER — GABAPENTIN 600 MG PO TABS: 600.0000 mg | ORAL_TABLET | Freq: Two times a day (BID) | ORAL | 0 refills | Status: DC

## 2022-01-12 MED ORDER — HYDROMORPHONE HCL 4 MG PO TABS: 4.0000 mg | ORAL_TABLET | ORAL | 0 refills | Status: DC | PRN

## 2022-01-12 NOTE — [place] FL2 (Signed)
[place] MEDICAID FL2 LEVEL OF CARE FORM     IDENTIFICATION  Patient Name: Sierra Perez Birthdate: May 25, 1960 Sex: female Admission Date (Current Location): 01/09/2022  [facility] and [place] Number:  Herbalist and Address:  [facility],  [place] [place], [place]      Provider Number: [id number]  Attending Physician Name and Address:  Mcarthur Rossetti  Relative Name and Phone Number:       Current Level of Care: Hospital Recommended Level of Care: [place] Prior Approval Number:    Date Approved/Denied:   PASRR Number: [id number]  Discharge Plan: SNF    Current Diagnoses: Patient Active Problem List   Diagnosis Date Noted   Status post total right knee replacement 01/09/2022   Obesity (BMI 30-39.9) 06/23/2021   Iron deficiency anemia 01/23/2021   Status post right hip replacement 12/03/2020   Status post total replacement of right hip 11/19/2020   Breast cancer ([place]) 07/08/2020   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   H/O total hysterectomy 06/11/2020   Genetic testing 05/27/2020   Chronic superficial gastritis 05/27/2020   Fibromyalgia 05/27/2020   GERD (gastroesophageal reflux disease) 05/27/2020   Prediabetes 05/27/2020   Complex regional pain syndrome I, unspecified 05/27/2020   Atypical lobular hyperplasia [facility]) of left breast 05/23/2020   History of therapeutic radiation 05/23/2020   Family history of breast cancer    Family history of colon cancer    Personal history of malignant neoplasm of breast    Unilateral primary osteoarthritis, right knee 02/26/2020   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   DVT (deep venous thrombosis) ([place]) 05/04/2018   Malignant neoplasm of upper-outer quadrant of right breast in female, estrogen receptor negative ([place]) 05/03/2018   Obstructive sleep apnea (adult) (pediatric) 09/14/2017   Adenopathy 07/15/2017   B12 deficiency 07/15/2017   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 07/15/2017   Recurrent UTI 03/09/2017   Bladder pain 02/25/2017   Proteinuria 02/25/2017   Hematoma of thigh, left, subsequent encounter 08/16/2016   At risk for obstructive sleep apnea 07/01/2016   Suspected sleep apnea 07/01/2016   Arthritis of left knee 05/26/2016   Localized primary osteoarthritis of lower leg, left 05/26/2016   Clotting disorder ([place]) 05/22/2016   Chronic anticoagulation 01/06/2016   Right-sided epistaxis 01/06/2016   Urinary tract infection, site not specified 08/12/2015   Abnormal Pap smear of vagina 05/06/2015   Follow-up examination, following other surgery 12/14/2013   Pelvic and perineal pain 11/28/2013   Anxiety and depression 11/16/2013   H/O bariatric surgery 11/16/2013   Atrophic vaginitis 11/13/2013   Tear of lateral cartilage or meniscus of knee, current 04/14/2013   Allergic rhinitis 01/16/2013   Female genital symptoms 11/10/2012   Symptomatic menopausal or female climacteric states 11/10/2012   Disequilibrium 06/09/2012   Morbid obesity ([place]) 02/09/2012   Vitamin D deficiency 09/13/2008   Preop examination 04/20/2008   Hypothyroidism 05/20/2007    Orientation RESPIRATION BLADDER Height & Weight     Self, Time, Situation, Place  Normal Continent Weight: 187 lb 6.3 oz (85 kg) Height:  '5\' 8"'$  (172.7 cm)  BEHAVIORAL SYMPTOMS/MOOD NEUROLOGICAL BOWEL NUTRITION STATUS      Continent Diet (regular)  AMBULATORY STATUS COMMUNICATION OF NEEDS Skin   Limited Assist Verbally Other (Comment) (surgical incision only)                       Personal Care Assistance Level of Assistance  Bathing, Dressing Bathing Assistance:  Limited assistance   Dressing Assistance: Limited assistance     Functional Limitations Info             SPECIAL CARE FACTORS FREQUENCY  PT (By licensed PT), OT (By licensed OT)     PT Frequency: 5x/wk OT Frequency: 5x/wk            Contractures Contractures Info: Not present    Additional Factors Info   Code Status, Allergies, Psychotropic Code Status Info: Full Allergies Info: Succinylcholine, Azithromycin, Keflex (Cephalexin), Silver, Sulfa Antibiotics, Sulfasalazine Psychotropic Info: see MAR         Current Medications (01/12/2022):  This is the current hospital active medication list Current Facility-Administered Medications  Medication Dose Route Frequency Provider Last Rate Last Admin   acetaminophen (TYLENOL) tablet 325-650 mg  325-650 mg Oral Q6H PRN Mcarthur Rossetti, MD       albuterol (PROVENTIL) (2.5 MG/3ML) 0.083% nebulizer solution 2.5 mg  2.5 mg Inhalation Q6H PRN Mcarthur Rossetti, MD       alum & mag hydroxide-simeth (MAALOX/MYLANTA) 200-200-20 MG/5ML suspension 30 mL  30 mL Oral Q4H PRN Mcarthur Rossetti, MD       amLODipine (NORVASC) tablet 5 mg  5 mg Oral Daily Mcarthur Rossetti, MD   5 mg at 01/12/22 0934   anastrozole (ARIMIDEX) tablet 1 mg  1 mg Oral QHS Mcarthur Rossetti, MD   1 mg at 01/11/22 2245   apixaban (ELIQUIS) tablet 5 mg  5 mg Oral BID Mcarthur Rossetti, MD   5 mg at 01/12/22 1610   ascorbic acid (VITAMIN C) tablet 500 mg  500 mg Oral Daily Mcarthur Rossetti, MD   500 mg at 01/12/22 9604   cholecalciferol (VITAMIN D3) 25 MCG (1000 UNIT) tablet 1,000 Units  1,000 Units Oral Daily Mcarthur Rossetti, MD   1,000 Units at 01/12/22 0934   diphenhydrAMINE (BENADRYL) 12.5 MG/5ML elixir 12.5-25 mg  12.5-25 mg Oral Q4H PRN Mcarthur Rossetti, MD       docusate sodium (COLACE) capsule 100 mg  100 mg Oral BID Mcarthur Rossetti, MD   100 mg at 01/12/22 0934   DULoxetine (CYMBALTA) DR capsule 90 mg  90 mg Oral Daily Mcarthur Rossetti, MD   90 mg at 01/12/22 0934   gabapentin (NEURONTIN) capsule 600 mg  600 mg Oral TID Mcarthur Rossetti, MD   600 mg at 01/12/22 0934   HYDROmorphone (DILAUDID) injection 0.5-1 mg  0.5-1 mg Intravenous Q2H PRN Mcarthur Rossetti, MD   1 mg at 01/10/22 1134    HYDROmorphone (DILAUDID) tablet 4 mg  4 mg Oral Q4H PRN Mcarthur Rossetti, MD   4 mg at 01/12/22 1052   levothyroxine (SYNTHROID) tablet 50 mcg  50 mcg Oral Q0600 Mcarthur Rossetti, MD   50 mcg at 01/12/22 0553   menthol-cetylpyridinium (CEPACOL) lozenge 3 mg  1 lozenge Oral PRN Mcarthur Rossetti, MD       Or   phenol (CHLORASEPTIC) mouth spray 1 spray  1 spray Mouth/Throat PRN Mcarthur Rossetti, MD       methocarbamol (ROBAXIN) tablet 500 mg  500 mg Oral Q6H PRN Mcarthur Rossetti, MD   500 mg at 01/11/22 1758   Or   methocarbamol (ROBAXIN) 500 mg in dextrose 5 % 50 mL IVPB  500 mg Intravenous Q6H PRN Mcarthur Rossetti, MD   Stopped at 01/09/22 1340   metoCLOPramide (REGLAN) tablet 5-10 mg  5-10 mg Oral  Q8H PRN Mcarthur Rossetti, MD       Or   metoCLOPramide (REGLAN) injection 5-10 mg  5-10 mg Intravenous Q8H PRN Mcarthur Rossetti, MD       ondansetron [employer]) tablet 4 mg  4 mg Oral Q6H PRN Mcarthur Rossetti, MD       Or   ondansetron [facility]) injection 4 mg  4 mg Intravenous Q6H PRN Mcarthur Rossetti, MD       oxyCODONE (OXYCONTIN) 12 hr tablet 10 mg  10 mg Oral Q12H Mcarthur Rossetti, MD   10 mg at 01/12/22 0934   pantoprazole (PROTONIX) EC tablet 40 mg  40 mg Oral Daily Mcarthur Rossetti, MD   40 mg at 01/12/22 0934   rosuvastatin (CRESTOR) tablet 5 mg  5 mg Oral Daily Mcarthur Rossetti, MD   5 mg at 01/12/22 6073     Discharge Medications: Please see discharge summary for a list of discharge medications.  Relevant Imaging Results:  Relevant Lab Results:   Additional Information SS# [id number]  Lennart Pall, LCSW

## 2022-01-12 NOTE — Progress Notes (Signed)
Physical Therapy Treatment Patient Details Name: Sierra Perez MRN: [id number] DOB: [birth year] Today's Date: 01/12/2022   History of Present Illness Pt s/p R TKR and with hx of DM, MVP, Fibromyalgia, L TKR, R THR    PT Comments    Pt tolerated increased ambulation distance of 180' with RW without use of knee immobilizer, no buckling of R knee. Pt performed TKA exercises with assistance, she is not yet able to do a R SLR independently.    Recommendations for follow up therapy are one component of a multi-disciplinary discharge planning process, led by the attending physician.  Recommendations may be updated based on patient status, additional functional criteria and insurance authorization.  Follow Up Recommendations  Skilled nursing-short term rehab (<3 hours/day) Can patient physically be transported by private vehicle: Yes   Assistance Recommended at Discharge Intermittent Supervision/Assistance  Patient can return home with the following A little help with walking and/or transfers;A little help with bathing/dressing/bathroom;Assistance with cooking/housework;Assist for transportation;Help with stairs or ramp for entrance   Equipment Recommendations  None recommended by PT    Recommendations for Other Services       Precautions / Restrictions Precautions Precautions: Fall;Knee Precaution Booklet Issued: Yes (comment) Precaution Comments: reviewed no pillow under knee Required Braces or Orthoses: Knee Immobilizer - Right Knee Immobilizer - Right: Discontinue once straight leg raise with < 10 degree lag Restrictions Weight Bearing Restrictions: No Other Position/Activity Restrictions: WBAT     Mobility  Bed Mobility Overal bed mobility: Modified Independent, Needs Assistance Bed Mobility: Sit to Supine     Supine to sit: HOB elevated, Modified independent (Device/Increase time) Sit to supine: Min assist   General bed mobility comments: min A RLE into bed     Transfers Overall transfer level: Needs assistance Equipment used: Rolling walker (2 wheels) Transfers: Sit to/from Stand Sit to Stand: Supervision           General transfer comment: cues for LE management and use of UEs to self assist    Ambulation/Gait Ambulation/Gait assistance: Supervision Gait Distance (Feet): 180 Feet Assistive device: Rolling walker (2 wheels) Gait Pattern/deviations: Step-to pattern, Decreased step length - right, Decreased step length - left, Trunk flexed Gait velocity: decr     General Gait Details: Increased time, good sequencing, no loss of balance   Stairs             Wheelchair Mobility    Modified Rankin (Stroke Patients Only)       Balance Overall balance assessment: Mild deficits observed, not formally tested                                          Cognition Arousal/Alertness: Awake/alert Behavior During Therapy: WFL for tasks assessed/performed Overall Cognitive Status: Within Functional Limits for tasks assessed                                          Exercises Total Joint Exercises Ankle Circles/Pumps: AROM, Both, 15 reps, Supine Quad Sets: AROM, Both, Supine, 5 reps Short Arc Quad: AAROM, Right, 10 reps, Supine Heel Slides: AAROM, Right, Supine, 10 reps Hip ABduction/ADduction: AAROM, Right, 10 reps, Supine Straight Leg Raises: AAROM, Right, 10 reps, Supine Long Arc Quad: AAROM, Right, 10 reps, Seated Knee Flexion: AAROM, 10 reps, Seated Goniometric ROM:  5-45* AAROM R knee    General Comments        Pertinent Vitals/Pain Pain Assessment Pain Score: 5  Pain Location: R knee with walking Pain Descriptors / Indicators: Aching, Sore Pain Intervention(s): Limited activity within patient's tolerance, Monitored during session, Premedicated before session, Ice applied    Home Living                          Prior Function            PT Goals (current goals  can now be found in the care plan section) Acute Rehab PT Goals Patient Stated Goal: REgain IND, play tennis, go for long walks PT Goal Formulation: With patient Time For Goal Achievement: 01/16/22 Potential to Achieve Goals: Good Progress towards PT goals: Progressing toward goals    Frequency    7X/week      PT Plan Current plan remains appropriate    Co-evaluation              AM-PAC PT "6 Clicks" Mobility   Outcome Measure  Help needed turning from your back to your side while in a flat bed without using bedrails?: A Little Help needed moving from lying on your back to sitting on the side of a flat bed without using bedrails?: A Little Help needed moving to and from a bed to a chair (including a wheelchair)?: A Little Help needed standing up from a chair using your arms (e.g., wheelchair or bedside chair)?: A Little Help needed to walk in hospital room?: A Little Help needed climbing 3-5 steps with a railing? : A Little 6 Click Score: 18    End of Session Equipment Utilized During Treatment: Gait belt;Right knee immobilizer Activity Tolerance: Patient tolerated treatment well Patient left: with call bell/phone within reach;in bed Nurse Communication: Mobility status PT Visit Diagnosis: Difficulty in walking, not elsewhere classified (R26.2)     Time: 1191-4782 PT Time Calculation (min) (ACUTE ONLY): 44 min  Charges:  $Gait Training: 8-22 mins $Therapeutic Exercise: 8-22 mins $Therapeutic Activity: 8-22 mins                     Blondell Reveal Kistler PT 01/12/2022  Acute Rehabilitation Services  Office [phone]

## 2022-01-12 NOTE — Progress Notes (Signed)
Physical Therapy Treatment Patient Details Name: Sierra Perez MRN: [id number] DOB: January 17, 1961 Today's Date: 01/12/2022   History of Present Illness Pt s/p R TKR and with hx of DM, MVP, Fibromyalgia, L TKR, R THR    PT Comments    Pt ambulated 120' with RW, no loss of balance. Reviewed TKA HEP. Pt has weak R quadriceps muscles, she is not yet able to perform R knee extension independently, nor a SLR independently. She is progressing well with ambulation distance.     Recommendations for follow up therapy are one component of a multi-disciplinary discharge planning process, led by the attending physician.  Recommendations may be updated based on patient status, additional functional criteria and insurance authorization.  Follow Up Recommendations  Skilled nursing-short term rehab (<3 hours/day) Can patient physically be transported by private vehicle: Yes   Assistance Recommended at Discharge Intermittent Supervision/Assistance  Patient can return home with the following A little help with walking and/or transfers;A little help with bathing/dressing/bathroom;Assistance with cooking/housework;Assist for transportation;Help with stairs or ramp for entrance   Equipment Recommendations  None recommended by PT    Recommendations for Other Services       Precautions / Restrictions Precautions Precautions: Fall;Knee Required Braces or Orthoses: Knee Immobilizer - Right Knee Immobilizer - Right: Discontinue once straight leg raise with < 10 degree lag Restrictions Weight Bearing Restrictions: No Other Position/Activity Restrictions: WBAT     Mobility  Bed Mobility Overal bed mobility: Modified Independent Bed Mobility: Supine to Sit     Supine to sit: HOB elevated, Modified independent (Device/Increase time)     General bed mobility comments: Increased time    Transfers Overall transfer level: Needs assistance Equipment used: Rolling walker (2 wheels) Transfers: Sit to/from  Stand Sit to Stand: Supervision           General transfer comment: cues for LE management and use of UEs to self assist    Ambulation/Gait Ambulation/Gait assistance: Supervision Gait Distance (Feet): 120 Feet Assistive device: Rolling walker (2 wheels) Gait Pattern/deviations: Step-to pattern, Decreased step length - right, Decreased step length - left, Shuffle, Trunk flexed Gait velocity: decr     General Gait Details: Increased time, good sequencing, no loss of balance   Stairs             Wheelchair Mobility    Modified Rankin (Stroke Patients Only)       Balance Overall balance assessment: Mild deficits observed, not formally tested                                          Cognition Arousal/Alertness: Awake/alert Behavior During Therapy: WFL for tasks assessed/performed Overall Cognitive Status: Within Functional Limits for tasks assessed                                          Exercises Total Joint Exercises Ankle Circles/Pumps: AROM, Both, 15 reps, Supine Quad Sets: AROM, Both, Supine, 5 reps Heel Slides: AAROM, Right, Supine, 10 reps Long Arc Quad: AAROM, Right, 10 reps, Seated Knee Flexion: AAROM, Strengthening, 10 reps, Seated Goniometric ROM: 5-45* AAROM R knee    General Comments        Pertinent Vitals/Pain Pain Assessment Pain Score: 6  Pain Location: R knee with walking Pain Descriptors / Indicators: Aching, Sore  Pain Intervention(s): Limited activity within patient's tolerance, Monitored during session, Premedicated before session, Ice applied    Home Living                          Prior Function            PT Goals (current goals can now be found in the care plan section) Acute Rehab PT Goals Patient Stated Goal: REgain IND, play tennis, go for long walks PT Goal Formulation: With patient Time For Goal Achievement: 01/16/22 Potential to Achieve Goals: Good Progress towards  PT goals: Progressing toward goals    Frequency    7X/week      PT Plan Current plan remains appropriate    Co-evaluation              AM-PAC PT "6 Clicks" Mobility   Outcome Measure  Help needed turning from your back to your side while in a flat bed without using bedrails?: A Little Help needed moving from lying on your back to sitting on the side of a flat bed without using bedrails?: A Little Help needed moving to and from a bed to a chair (including a wheelchair)?: A Little Help needed standing up from a chair using your arms (e.g., wheelchair or bedside chair)?: A Little Help needed to walk in hospital room?: A Little Help needed climbing 3-5 steps with a railing? : A Lot 6 Click Score: 17    End of Session Equipment Utilized During Treatment: Gait belt;Right knee immobilizer Activity Tolerance: Patient tolerated treatment well Patient left: with call bell/phone within reach;in chair Nurse Communication: Mobility status PT Visit Diagnosis: Difficulty in walking, not elsewhere classified (R26.2)     Time: 1131-1209 PT Time Calculation (min) (ACUTE ONLY): 38 min  Charges:  $Gait Training: 8-22 mins $Therapeutic Exercise: 8-22 mins $Therapeutic Activity: 8-22 mins                     Blondell Reveal Kistler PT 01/12/2022  Acute Rehabilitation Services  Office [phone]

## 2022-01-12 NOTE — Progress Notes (Signed)
Subjective: 3 Days Post-Op Procedure(s) (LRB): RIGHT TOTAL KNEE ARTHROPLASTY (Right) Patient reports pain as moderate.    Objective: Vital signs in last 24 hours: Temp:  [98.4 F (36.9 C)-99 F (37.2 C)] 98.4 F (36.9 C) (12/11 0503) Pulse Rate:  [90-99] 99 (12/11 0503) Resp:  [16] 16 (12/11 0503) BP: (108-135)/(60-94) 108/60 (12/11 0503) SpO2:  [91 %-100 %] 92 % (12/11 0503)  Intake/Output from previous day: 12/10 0701 - 12/11 0700 In: 600.1 [P.O.:360; I.V.:240.1] Out: 0  Intake/Output this shift: No intake/output data recorded.  Recent Labs    01/10/22 0503  HGB 9.7*   Recent Labs    01/10/22 0503  WBC 13.9*  RBC 3.34*  HCT 29.7*  PLT 405*   Recent Labs    01/10/22 0503  NA 137  K 3.7  CL 105  CO2 26  BUN 9  CREATININE 0.68  GLUCOSE 118*  CALCIUM 9.2   No results for input(s): "LABPT", "INR" in the last 72 hours.  Sensation intact distally Intact pulses distally Dorsiflexion/Plantar flexion intact Incision: scant drainage No cellulitis present Compartment soft   Assessment/Plan: 3 Days Post-Op Procedure(s) (LRB): RIGHT TOTAL KNEE ARTHROPLASTY (Right) Up with therapy Discharge to SNF when bed available Consult has been put out to [place] 01/12/2022, 7:45 AM

## 2022-01-12 NOTE — TOC Progression Note (Signed)
Transition of Care [facility]) - Progression Note    Patient Details  Name: Sierra Perez MRN: [id number] Date of Birth: [birth year]  Transition [facility]) CM/SW Contact  Lennart Pall, LCSW Phone Number: 01/12/2022, 1:55 PM  Clinical Narrative:    Have reviewed SNF bed offers with pt and she has accepted bed with [facility].  Insurance auth begun.   Expected Discharge Plan: [place] (vs. SNF) Barriers to Discharge: Continued Medical Work up  Expected Discharge Plan and Services Expected Discharge Plan: [place] (vs. SNF) In-house Referral: Clinical Social Work     Living arrangements for the past 2 months: Single Family Home                                       Social Determinants of Health (SDOH) Interventions    Readmission Risk Interventions     No data to display

## 2022-01-13 ENCOUNTER — Encounter: Payer: Self-pay | Admitting: Hematology and Oncology

## 2022-01-13 DIAGNOSIS — M1711 Unilateral primary osteoarthritis, right knee: Secondary | ICD-10-CM | POA: Diagnosis not present

## 2022-01-13 NOTE — TOC Progression Note (Signed)
Transition of Care [facility]) - Progression Note    Patient Details  Name: Sierra Perez MRN: [id number] Date of Birth: 09-10-1960  [facility] CM/SW Contact  Lennart Pall, LCSW Phone Number: 01/13/2022, 2:21 PM  Clinical Narrative:     Continue to await insurance authorization for SNF bed at [place].  Pt aware.  Expected Discharge Plan: [place] (vs. SNF) Barriers to Discharge: Continued Medical Work up  Expected Discharge Plan and Services Expected Discharge Plan: [place] (vs. SNF) In-house Referral: Clinical Social Work     Living arrangements for the past 2 months: Single Family Home Expected Discharge Date: 01/13/22                                     Social Determinants of Health (SDOH) Interventions    Readmission Risk Interventions     No data to display

## 2022-01-13 NOTE — Progress Notes (Signed)
Subjective: 4 Days Post-Op Procedure(s) (LRB): RIGHT TOTAL KNEE ARTHROPLASTY (Right) Patient reports pain as moderate.  Has been seen by Transitional Care Team and FL-2 signed.  Bed available at short-term SNF.  Awaiting insurance authorization.  No support at home for the patient  Objective: Vital signs in last 24 hours: Temp:  [99 F (37.2 C)-99.7 F (37.6 C)] 99 F (37.2 C) (12/12 0558) Pulse Rate:  [93-109] 93 (12/12 0558) Resp:  [16] 16 (12/12 0558) BP: (101-130)/(48-74) 130/74 (12/12 0558) SpO2:  [98 %-100 %] 98 % (12/12 0558)  Intake/Output from previous day: 12/11 0701 - 12/12 0700 In: 840 [P.O.:840] Out: -  Intake/Output this shift: No intake/output data recorded.  No results for input(s): "HGB" in the last 72 hours. No results for input(s): "WBC", "RBC", "HCT", "PLT" in the last 72 hours. No results for input(s): "NA", "K", "CL", "CO2", "BUN", "CREATININE", "GLUCOSE", "CALCIUM" in the last 72 hours. No results for input(s): "LABPT", "INR" in the last 72 hours.  Sensation intact distally Intact pulses distally Dorsiflexion/Plantar flexion intact Incision: dressing C/D/I Compartment soft   Assessment/Plan: 4 Days Post-Op Procedure(s) (LRB): RIGHT TOTAL KNEE ARTHROPLASTY (Right) Up with therapy Discharge to SNF today.      Mcarthur Rossetti 01/13/2022, 7:14 AM

## 2022-01-13 NOTE — Progress Notes (Signed)
Physical Therapy Treatment Patient Details Name: Sierra Perez MRN: [id number] DOB: January 20, 1961 Today's Date: 01/13/2022   History of Present Illness Pt s/p R TKR and with hx of DM, MVP, Fibromyalgia, L TKR, R THR    PT Comments    Pt ambulated 120' with RW, no loss of balance. R knee pain 7/10, pain meds requested and ice applied.    Recommendations for follow up therapy are one component of a multi-disciplinary discharge planning process, led by the attending physician.  Recommendations may be updated based on patient status, additional functional criteria and insurance authorization.  Follow Up Recommendations  Skilled nursing-short term rehab (<3 hours/day) Can patient physically be transported by private vehicle: Yes   Assistance Recommended at Discharge Intermittent Supervision/Assistance  Patient can return home with the following A little help with walking and/or transfers;A little help with bathing/dressing/bathroom;Assistance with cooking/housework;Assist for transportation;Help with stairs or ramp for entrance   Equipment Recommendations  None recommended by PT    Recommendations for Other Services       Precautions / Restrictions Precautions Precautions: Fall;Knee Precaution Booklet Issued: Yes (comment) Precaution Comments: reviewed no pillow under knee Required Braces or Orthoses: Knee Immobilizer - Right Knee Immobilizer - Right: Discontinue once straight leg raise with < 10 degree lag Restrictions Weight Bearing Restrictions: No Other Position/Activity Restrictions: WBAT     Mobility  Bed Mobility Overal bed mobility: Modified Independent, Needs Assistance Bed Mobility: Sit to Supine, Supine to Sit     Supine to sit: Supervision, HOB elevated Sit to supine: Min assist   General bed mobility comments: min A RLE into bed    Transfers Overall transfer level: Needs assistance Equipment used: Rolling walker (2 wheels) Transfers: Sit to/from Stand Sit to  Stand: Supervision           General transfer comment: cues for LE management and use of UEs to self assist    Ambulation/Gait Ambulation/Gait assistance: Supervision Gait Distance (Feet): 120 Feet Assistive device: Rolling walker (2 wheels) Gait Pattern/deviations: Step-to pattern, Decreased step length - right, Decreased step length - left, Trunk flexed Gait velocity: decr     General Gait Details: Increased time, good sequencing, no loss of balance   Stairs             Wheelchair Mobility    Modified Rankin (Stroke Patients Only)       Balance Overall balance assessment: Mild deficits observed, not formally tested                                          Cognition Arousal/Alertness: Awake/alert Behavior During Therapy: WFL for tasks assessed/performed Overall Cognitive Status: Within Functional Limits for tasks assessed                                          Exercises Total Joint Exercises Ankle Circles/Pumps: AROM, Both, 15 reps, Supine Quad Sets: AROM, Both, Supine, 5 reps Short Arc Quad: AAROM, Right, 10 reps, Supine Heel Slides: AAROM, Right, Supine, 10 reps Hip ABduction/ADduction: AAROM, Right, 10 reps, Supine Straight Leg Raises: AAROM, Right, 10 reps, Supine Long Arc Quad: AAROM, Right, 10 reps, Seated Knee Flexion: AAROM, 10 reps, Seated Goniometric ROM: 5-50* AAROM R knee    General Comments        Pertinent  Vitals/Pain Pain Assessment Pain Score: 7  Pain Location: R knee with walking Pain Descriptors / Indicators: Aching, Sore Pain Intervention(s): Limited activity within patient's tolerance, Monitored during session, Ice applied, Patient requesting pain meds-RN notified    Home Living                          Prior Function            PT Goals (current goals can now be found in the care plan section) Acute Rehab PT Goals Patient Stated Goal: REgain IND, play tennis, go for long  walks PT Goal Formulation: With patient Time For Goal Achievement: 01/16/22 Potential to Achieve Goals: Good Progress towards PT goals: Progressing toward goals    Frequency    7X/week      PT Plan Current plan remains appropriate    Co-evaluation              AM-PAC PT "6 Clicks" Mobility   Outcome Measure  Help needed turning from your back to your side while in a flat bed without using bedrails?: A Little Help needed moving from lying on your back to sitting on the side of a flat bed without using bedrails?: A Little Help needed moving to and from a bed to a chair (including a wheelchair)?: A Little Help needed standing up from a chair using your arms (e.g., wheelchair or bedside chair)?: None Help needed to walk in hospital room?: A Little Help needed climbing 3-5 steps with a railing? : A Little 6 Click Score: 19    End of Session Equipment Utilized During Treatment: Gait belt;Right knee immobilizer Activity Tolerance: Patient tolerated treatment well Patient left: with call bell/phone within reach;in bed;with bed alarm set Nurse Communication: Mobility status PT Visit Diagnosis: Difficulty in walking, not elsewhere classified (R26.2)     Time: 1600-1620 PT Time Calculation (min) (ACUTE ONLY): 20 min  Charges:  $Gait Training: 8-22 mins                     Blondell Reveal Kistler PT 01/13/2022  Acute Rehabilitation Services  Office [phone]

## 2022-01-13 NOTE — Discharge Summary (Signed)
Patient ID: Sierra Perez MRN: [id number] DOB/AGE: [birth year] 61 y.o.  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, her-2 positive, ER/PR negative   Clotting disorder ([place])    testing was negative, h/o DVT while on treatment   Colon polyps    Complex regional pain syndrome i of right lower limb    RDS   Complication of anesthesia    Cystitis    Diabetes mellitus without complication ([place])    DVT (deep venous thrombosis) ([place])    started in 2015, multiple   Family history of breast cancer    Family history of colon cancer    Fibroid    Fibromyalgia    GERD (gastroesophageal reflux disease)    Heart murmur    HLD (hyperlipidemia)    HPV in female    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 female who was admitted 01/09/2022 for operative treatment ofUnilateral primary osteoarthritis, right knee. Patient has severe unremitting pain that affects sleep, daily activities, and work/hobbies. After pre-op clearance the patient was taken to the operating room on 01/09/2022 and underwent  Procedure(s): RIGHT TOTAL KNEE ARTHROPLASTY.    Patient was given perioperative antibiotics:  Anti-infectives (From admission, onward)    Start     Dose/Rate Route  Frequency Ordered Stop   01/09/22 2200  vancomycin (VANCOCIN) IVPB 1000 mg/200 mL premix        1,000 mg 200 mL/hr over 60 Minutes Intravenous Every 12 hours 01/09/22 1540 01/09/22 2235   01/09/22 0915  ceFAZolin (ANCEF) IVPB 2g/100 mL premix        2 g 200 mL/hr over 30 Minutes Intravenous On call to O.R. 01/09/22 0901 01/09/22 1129        Patient was given sequential compression devices, early ambulation, and chemoprophylaxis to prevent DVT.  Patient benefited maximally from hospital stay and there were no complications.    Recent vital signs: Patient Vitals for the past 24 hrs:  BP Temp Temp src Pulse Resp SpO2  01/13/22 0558 130/74 99 F (37.2 C) Oral 93 16 98 %  01/12/22 2003 (!) 101/48 99.7 F (37.6 C) Oral (!) 109 16 98 %  01/12/22 1222 (!) 114/59 -- -- (!) 101 16 100 %     Recent laboratory studies: No results for input(s): "WBC", "HGB", "HCT", "PLT", "NA", "K", "CL", "CO2", "BUN", "CREATININE", "GLUCOSE", "INR", "CALCIUM" in the last 72 hours.  Invalid input(s): "PT", "2"   Discharge Medications:   Allergies as of 01/13/2022       Reactions   Succinylcholine Other (See Comments)   Muscle aching 18 years ago from 07/02/20   Azithromycin    **DOES NOT WORK** **DOES NOT WORK**  Keflex [cephalexin] Diarrhea, Nausea And Vomiting   Silver Rash   Sulfa Antibiotics Rash   And swelling generalized not in throat   Sulfasalazine Rash        Medication List     STOP taking these medications    oxyCODONE 5 MG immediate release tablet Commonly known as: Roxicodone       TAKE these medications    acetaminophen 500 MG tablet Commonly known as: TYLENOL Take 1,000 mg by mouth every 6 (six) hours as needed for moderate pain.   albuterol 108 (90 Base) MCG/ACT inhaler Commonly known as: VENTOLIN HFA Inhale 1 puff into the lungs every 6 (six) hours as needed for wheezing or shortness of breath.   amLODipine 5 MG tablet Commonly known as: NORVASC Take 5 mg by  mouth daily.   anastrozole 1 MG tablet Commonly known as: ARIMIDEX Take 1 tablet (1 mg total) by mouth daily.   CALCIUM CITRATE PO Take 2 tablets by mouth daily.   CULTURELLE PO Take 1 capsule by mouth daily.   DULoxetine 60 MG capsule Commonly known as: CYMBALTA Take 60 mg by mouth daily.   DULoxetine 30 MG capsule Commonly known as: CYMBALTA 30 mg daily.   Eliquis 5 MG Tabs tablet Generic drug: apixaban TAKE 1 TABLET(5 MG) BY MOUTH TWICE DAILY   enoxaparin 80 MG/0.8ML injection Commonly known as: Lovenox Inject 0.8 mLs (80 mg total) into the skin every 12 (twelve) hours.   fluticasone 50 MCG/ACT nasal spray Commonly known as: FLONASE Place 2 sprays into both nostrils daily.   gabapentin 600 MG tablet Commonly known as: NEURONTIN Take 1 tablet (600 mg total) by mouth 2 (two) times daily.   HYDROmorphone 4 MG tablet Commonly known as: DILAUDID Take 1 tablet (4 mg total) by mouth every 4 (four) hours as needed for severe pain (pain score 7-10).   ipratropium 0.03 % nasal spray Commonly known as: ATROVENT Place 2 sprays into both nostrils 2 (two) times daily.   levothyroxine 50 MCG tablet Commonly known as: SYNTHROID Take 50 mcg by mouth every morning.   methocarbamol 500 MG tablet Commonly known as: ROBAXIN Take 1 tablet (500 mg total) by mouth every 6 (six) hours as needed for muscle spasms.   Mounjaro 10 MG/0.5ML Pen Generic drug: tirzepatide Inject 10 mg into the skin once a week.   NON FORMULARY Pt uses a cpap nightly   pantoprazole 40 MG tablet Commonly known as: Protonix Take 1 tablet (40 mg total) by mouth daily.   rosuvastatin 5 MG tablet Commonly known as: CRESTOR Take 1 tablet (5 mg total) by mouth daily.   valACYclovir 500 MG tablet Commonly known as: VALTREX Take 1 tablet (500 mg total) by mouth daily. Increase to bid x 3 days with symptoms What changed:  when to take this reasons to take this   Vitamin D-3 25 MCG (1000 UT)  Caps Take 1,000 Units by mouth daily.               Durable Medical Equipment  (From admission, onward)           Start     Ordered   01/09/22 1541  DME 3 n 1  Once        01/09/22 1540   01/09/22 1541  DME Walker rolling  Once       Question Answer Comment  Walker: With 5 Inch Wheels   Patient needs a walker to treat with the following condition Status post total  right knee replacement      01/09/22 1540            Diagnostic Studies: DG Knee Right Port  Result Date: 01/09/2022 CLINICAL DATA:  Status post total RIGHT knee replacement EXAM: PORTABLE RIGHT KNEE - 1-2 VIEW COMPARISON:  None Available. FINDINGS: Total knee arthroplasty. Prosthetic components are well seated. Expected soft tissue changes in the anterior knee. IMPRESSION: No complication following total knee arthroplasty Electronically Signed   By: Suzy Bouchard M.D.   On: 01/09/2022 13:46   [place] LT UPPER EXTREM LTD SOFT TISSUE NON VASCULAR  Result Date: 12/19/2021 CLINICAL DATA:  Left upper extremity swelling for 3 weeks. Anti coagulation. EXAM: ULTRASOUND left UPPER EXTREMITY LIMITED TECHNIQUE: Ultrasound examination of the upper extremity soft tissues was performed in the area of clinical concern. COMPARISON:  None FINDINGS: In the area of interest an ill-defined mildly heterogeneous but primarily hypoechoic lesion without substantial internal Doppler flow measures about 2.2 by 2.8 by 0.8 cm (volume = 3 cm^3). This could reflect a local hematoma in the subcutaneous tissues. IMPRESSION: 1. 3 cc mildly complex collection corresponding to the focal swelling. Based on location and clinical scenario this could reflect subcutaneous hematoma or olecranon bursitis. Electronically Signed   By: Van Clines M.D.   On: 12/19/2021 13:07    Disposition: Discharge disposition: 03-Skilled [place] information for follow-up providers     Mcarthur Rossetti, MD Follow up in 2  week(s).   Specialty: Orthopedic Surgery Contact information: [place] [address] [phone]              Contact information for after-discharge care     Destination     [facility]erred [facility] .   Service: Skilled Nursing Contact information: [place] [phone]                      Signed: Mcarthur Rossetti 01/13/2022, 7:16 AM

## 2022-01-13 NOTE — Progress Notes (Signed)
Physical Therapy Treatment Patient Details Name: Sierra Perez MRN: [id number] DOB: Dec 21, 1960 Today's Date: 01/13/2022   History of Present Illness Pt s/p R TKR and with hx of DM, MVP, Fibromyalgia, L TKR, R THR    PT Comments    Pt is progressing well with mobility, she ambulated 150' with RW, no loss of balance. Reviewed TKA HEP, pt is not yet able to perform SLR independently. Pt puts forth good effort.    Recommendations for follow up therapy are one component of a multi-disciplinary discharge planning process, led by the attending physician.  Recommendations may be updated based on patient status, additional functional criteria and insurance authorization.  Follow Up Recommendations  Skilled nursing-short term rehab (<3 hours/day) Can patient physically be transported by private vehicle: Yes   Assistance Recommended at Discharge Intermittent Supervision/Assistance  Patient can return home with the following A little help with walking and/or transfers;A little help with bathing/dressing/bathroom;Assistance with cooking/housework;Assist for transportation;Help with stairs or ramp for entrance   Equipment Recommendations  None recommended by PT    Recommendations for Other Services       Precautions / Restrictions Precautions Precautions: Fall;Knee Precaution Booklet Issued: Yes (comment) Precaution Comments: reviewed no pillow under knee Required Braces or Orthoses: Knee Immobilizer - Right Knee Immobilizer - Right: Discontinue once straight leg raise with < 10 degree lag Restrictions Weight Bearing Restrictions: No Other Position/Activity Restrictions: WBAT     Mobility  Bed Mobility               General bed mobility comments: up in recliner    Transfers Overall transfer level: Needs assistance Equipment used: Rolling walker (2 wheels) Transfers: Sit to/from Stand Sit to Stand: Supervision           General transfer comment: supervision for safety     Ambulation/Gait Ambulation/Gait assistance: Supervision Gait Distance (Feet): 150 Feet Assistive device: Rolling walker (2 wheels) Gait Pattern/deviations: Step-to pattern, Decreased step length - right, Decreased step length - left, Trunk flexed Gait velocity: decr     General Gait Details: Increased time, good sequencing, no loss of balance   Stairs             Wheelchair Mobility    Modified Rankin (Stroke Patients Only)       Balance Overall balance assessment: Mild deficits observed, not formally tested                                          Cognition Arousal/Alertness: Awake/alert Behavior During Therapy: WFL for tasks assessed/performed Overall Cognitive Status: Within Functional Limits for tasks assessed                                          Exercises Total Joint Exercises Ankle Circles/Pumps: AROM, Both, 15 reps, Supine Quad Sets: AROM, Both, Supine, 5 reps Short Arc Quad: AAROM, Right, 10 reps, Supine Heel Slides: AAROM, Right, Supine, 10 reps Hip ABduction/ADduction: AAROM, Right, 10 reps, Supine Straight Leg Raises: AAROM, Right, 10 reps, Supine Long Arc Quad: AAROM, Right, 10 reps, Seated Knee Flexion: AAROM, 10 reps, Seated Goniometric ROM: 5-50* AAROM R knee    General Comments        Pertinent Vitals/Pain Pain Assessment Pain Score: 3  Pain Location: R knee with walking Pain Descriptors /  Indicators: Aching, Sore Pain Intervention(s): Limited activity within patient's tolerance, Monitored during session, Premedicated before session, Ice applied    Home Living                          Prior Function            PT Goals (current goals can now be found in the care plan section) Acute Rehab PT Goals Patient Stated Goal: REgain IND, play tennis, go for long walks PT Goal Formulation: With patient Time For Goal Achievement: 01/16/22 Potential to Achieve Goals: Good Progress towards  PT goals: Progressing toward goals    Frequency    7X/week      PT Plan Current plan remains appropriate    Co-evaluation              AM-PAC PT "6 Clicks" Mobility   Outcome Measure  Help needed turning from your back to your side while in a flat bed without using bedrails?: A Little Help needed moving from lying on your back to sitting on the side of a flat bed without using bedrails?: A Little Help needed moving to and from a bed to a chair (including a wheelchair)?: A Little Help needed standing up from a chair using your arms (e.g., wheelchair or bedside chair)?: None Help needed to walk in hospital room?: A Little Help needed climbing 3-5 steps with a railing? : A Little 6 Click Score: 19    End of Session Equipment Utilized During Treatment: Gait belt;Right knee immobilizer Activity Tolerance: Patient tolerated treatment well Patient left: with call bell/phone within reach;in chair Nurse Communication: Mobility status PT Visit Diagnosis: Difficulty in walking, not elsewhere classified (R26.2)     Time: 2297-9892 PT Time Calculation (min) (ACUTE ONLY): 53 min  Charges:  $Gait Training: 23-37 mins $Therapeutic Exercise: 23-37 mins                     Blondell Reveal Kistler PT 01/13/2022  Acute Rehabilitation Services  Office [phone]

## 2022-01-14 ENCOUNTER — Encounter (HOSPITAL_COMMUNITY): Payer: Self-pay | Admitting: Orthopaedic Surgery

## 2022-01-14 ENCOUNTER — Encounter: Payer: Self-pay | Admitting: Orthopaedic Surgery

## 2022-01-14 DIAGNOSIS — M1711 Unilateral primary osteoarthritis, right knee: Secondary | ICD-10-CM | POA: Diagnosis not present

## 2022-01-14 MED ORDER — HYDROMORPHONE HCL 2 MG PO TABS
2.0000 mg | ORAL_TABLET | ORAL | Status: DC | PRN
  Administered 2022-01-14 – 2022-01-15 (×4): 2 mg via ORAL
  Filled 2022-01-14 (×3): qty 1

## 2022-01-14 MED ORDER — SENNOSIDES-DOCUSATE SODIUM 8.6-50 MG PO TABS
1.0000 | ORAL_TABLET | Freq: Once | ORAL | Status: AC
  Administered 2022-01-14: 1 via ORAL
  Filled 2022-01-14: qty 1

## 2022-01-14 MED ORDER — POLYETHYLENE GLYCOL 3350 17 G PO PACK
17.0000 g | PACK | Freq: Every day | ORAL | Status: DC
  Administered 2022-01-14 – 2022-01-16 (×3): 17 g via ORAL
  Filled 2022-01-14 (×3): qty 1

## 2022-01-14 NOTE — Discharge Summary (Signed)
Patient ID: Sierra Perez MRN: [id number] DOB/AGE: Feb 28, 1960 61 y.o.  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, her-2 positive, ER/PR negative   Clotting disorder ([place])    testing was negative, h/o DVT while on treatment   Colon polyps    Complex regional pain syndrome i of right lower limb    RDS   Complication of anesthesia    Cystitis    Diabetes mellitus without complication ([place])    DVT (deep venous thrombosis) ([place])    started in 2015, multiple   Family history of breast cancer    Family history of colon cancer    Fibroid    Fibromyalgia    GERD (gastroesophageal reflux disease)    Heart murmur    HLD (hyperlipidemia)    HPV in female    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 female who was admitted 01/09/2022 for operative treatment ofUnilateral primary osteoarthritis, right knee. Patient has severe unremitting pain that affects sleep, daily activities, and work/hobbies. After pre-op clearance the patient was taken to the operating room on 01/09/2022 and underwent  Procedure(s): RIGHT TOTAL KNEE ARTHROPLASTY.    Patient was given perioperative antibiotics:  Anti-infectives (From admission, onward)    Start     Dose/Rate Route  Frequency Ordered Stop   01/09/22 2200  vancomycin (VANCOCIN) IVPB 1000 mg/200 mL premix        1,000 mg 200 mL/hr over 60 Minutes Intravenous Every 12 hours 01/09/22 1540 01/09/22 2235   01/09/22 0915  ceFAZolin (ANCEF) IVPB 2g/100 mL premix        2 g 200 mL/hr over 30 Minutes Intravenous On call to O.R. 01/09/22 0901 01/09/22 1129        Patient was given sequential compression devices, early ambulation, and chemoprophylaxis to prevent DVT.  Patient benefited maximally from hospital stay and there were no complications.    Recent vital signs: Patient Vitals for the past 24 hrs:  BP Temp Temp src Pulse Resp SpO2  01/14/22 0645 120/70 98.4 F (36.9 C) Oral (!) 101 18 100 %  01/13/22 2126 129/73 100.2 F (37.9 C) Oral 99 18 100 %  01/13/22 1300 114/63 98.3 F (36.8 C) Oral (!) 102 17 100 %     Recent laboratory studies: No results for input(s): "WBC", "HGB", "HCT", "PLT", "NA", "K", "CL", "CO2", "BUN", "CREATININE", "GLUCOSE", "INR", "CALCIUM" in the last 72 hours.  Invalid input(s): "PT", "2"   Discharge Medications:   Allergies as of 01/14/2022       Reactions   Succinylcholine Other (See Comments)   Muscle aching 18 years ago from 07/02/20   Azithromycin    **DOES NOT WORK** **DOES NOT WORK**  Keflex [cephalexin] Diarrhea, Nausea And Vomiting   Silver Rash   Sulfa Antibiotics Rash   And swelling generalized not in throat   Sulfasalazine Rash        Medication List     STOP taking these medications    enoxaparin 80 MG/0.8ML injection Commonly known as: Lovenox   oxyCODONE 5 MG immediate release tablet Commonly known as: Roxicodone       TAKE these medications    acetaminophen 500 MG tablet Commonly known as: TYLENOL Take 1,000 mg by mouth every 6 (six) hours as needed for moderate pain.   albuterol 108 (90 Base) MCG/ACT inhaler Commonly known as: VENTOLIN HFA Inhale 1 puff into the lungs every 6 (six) hours as needed for wheezing or shortness  of breath.   amLODipine 5 MG tablet Commonly known as: NORVASC Take 5 mg by mouth daily.   anastrozole 1 MG tablet Commonly known as: ARIMIDEX Take 1 tablet (1 mg total) by mouth daily.   CALCIUM CITRATE PO Take 2 tablets by mouth daily.   CULTURELLE PO Take 1 capsule by mouth daily.   DULoxetine 60 MG capsule Commonly known as: CYMBALTA Take 60 mg by mouth daily.   DULoxetine 30 MG capsule Commonly known as: CYMBALTA 30 mg daily.   Eliquis 5 MG Tabs tablet Generic drug: apixaban TAKE 1 TABLET(5 MG) BY MOUTH TWICE DAILY   fluticasone 50 MCG/ACT nasal spray Commonly known as: FLONASE Place 2 sprays into both nostrils daily.   gabapentin 600 MG tablet Commonly known as: NEURONTIN Take 1 tablet (600 mg total) by mouth 2 (two) times daily.   HYDROmorphone 4 MG tablet Commonly known as: DILAUDID Take 1 tablet (4 mg total) by mouth every 4 (four) hours as needed for severe pain (pain score 7-10).   ipratropium 0.03 % nasal spray Commonly known as: ATROVENT Place 2 sprays into both nostrils 2 (two) times daily.   levothyroxine 50 MCG tablet Commonly known as: SYNTHROID Take 50 mcg by mouth every morning.   methocarbamol 500 MG tablet Commonly known as: ROBAXIN Take 1 tablet (500 mg total) by mouth every 6 (six) hours as needed for muscle spasms.   Mounjaro 10 MG/0.5ML Pen Generic drug: tirzepatide Inject 10 mg into the skin once a week.   NON FORMULARY Pt uses a cpap nightly   pantoprazole 40 MG tablet Commonly known as: Protonix Take 1 tablet (40 mg total) by mouth daily.   rosuvastatin 5 MG tablet Commonly known as: CRESTOR Take 1 tablet (5 mg total) by mouth daily.   valACYclovir 500 MG tablet Commonly known as: VALTREX Take 1 tablet (500 mg total) by mouth daily. Increase to bid x 3 days with symptoms What changed:  when to take this reasons to take this   Vitamin D-3 25 MCG (1000 UT) Caps Take 1,000 Units by mouth daily.                Durable Medical Equipment  (From admission, onward)           Start     Ordered   01/09/22 1541  DME 3 n 1  Once        01/09/22 1540   01/09/22 1541  DME Walker rolling  Once       Question Answer Comment  Walker: With 5 Inch Wheels   Patient needs a walker to treat with the following condition Status post total right knee replacement      01/09/22 1540  Diagnostic Studies: DG Knee Right Port  Result Date: 01/09/2022 CLINICAL DATA:  Status post total RIGHT knee replacement EXAM: PORTABLE RIGHT KNEE - 1-2 VIEW COMPARISON:  None Available. FINDINGS: Total knee arthroplasty. Prosthetic components are well seated. Expected soft tissue changes in the anterior knee. IMPRESSION: No complication following total knee arthroplasty Electronically Signed   By: Suzy Bouchard M.D.   On: 01/09/2022 13:46   [place] LT UPPER EXTREM LTD SOFT TISSUE NON VASCULAR  Result Date: 12/19/2021 CLINICAL DATA:  Left upper extremity swelling for 3 weeks. Anti coagulation. EXAM: ULTRASOUND left UPPER EXTREMITY LIMITED TECHNIQUE: Ultrasound examination of the upper extremity soft tissues was performed in the area of clinical concern. COMPARISON:  None FINDINGS: In the area of interest an ill-defined mildly heterogeneous but primarily hypoechoic lesion without substantial internal Doppler flow measures about 2.2 by 2.8 by 0.8 cm (volume = 3 cm^3). This could reflect a local hematoma in the subcutaneous tissues. IMPRESSION: 1. 3 cc mildly complex collection corresponding to the focal swelling. Based on location and clinical scenario this could reflect subcutaneous hematoma or olecranon bursitis. Electronically Signed   By: Van Clines M.D.   On: 12/19/2021 13:07    Disposition: Discharge disposition: 03-Skilled [place] information for follow-up providers     Mcarthur Rossetti, MD Follow up in 2 week(s).   Specialty: Orthopedic Surgery Contact  information: [address] [place] [address] [phone]              Contact information for after-discharge care     Destination     [facility] Preferred SNF .   Service: Skilled Nursing Contact information: [place] [phone]                      Signed: Mcarthur Rossetti 01/14/2022, 7:08 AM

## 2022-01-14 NOTE — Progress Notes (Signed)
PT using personal CPAP. RT plugged equip in red outlet, checked for frays on power cord, and provided sterile water.

## 2022-01-14 NOTE — TOC Progression Note (Signed)
Transition of Care [facility]) - Progression Note    Patient Details  Name: Sierra Perez MRN: [id number] Date of Birth: 19-Feb-1960  [facility] CM/SW Contact  Lennart Pall, LCSW Phone Number: 01/14/2022, 2:24 PM  Clinical Narrative:    Have spoken with pt about continued pending status of insurance authorization for SNF.  PT returning to room to work on stairs and also aware we continue to await for auth.  Pt continues to make progress and aware that we need to refocus on possible home dc if PT feels she is reaching a functionally safe level to do so. Pt agrees and asks for private duty agency info should that plan changed back to home dc.  TOC will continue to follow.   Expected Discharge Plan: [place] (vs. SNF) Barriers to Discharge: Continued Medical Work up  Expected Discharge Plan and Services Expected Discharge Plan: [place] (vs. SNF) In-house Referral: Clinical Social Work     Living arrangements for the past 2 months: Single Family Home Expected Discharge Date: 01/14/22                                     Social Determinants of Health (SDOH) Interventions    Readmission Risk Interventions     No data to display

## 2022-01-14 NOTE — Progress Notes (Signed)
Patient ID: Sierra Perez, female   DOB: 03-Apr-1960, 61 y.o.   MRN: [id number] Still awaiting a decision on insurance covering short-term skilled nursing.  While she is still in the hospital, physical therapy should attempt stair training due to the possibility of the patient will need to be discharged to home if insurance does not cover short-term skilled nursing.  She has not had a bowel movement so we will try some other medications to help with that.

## 2022-01-14 NOTE — Progress Notes (Signed)
Physical Therapy Treatment Patient Details Name: Sierra Perez MRN: [id number] DOB: 09/30/1960 Today's Date: 01/14/2022   History of Present Illness Pt s/p R TKR and with hx of DM, MVP, Fibromyalgia, L TKR, R THR    PT Comments    Pt is progressing well with mobility, she ambulated 220' with RW, no loss of balance, 1 instance of minor buckling of R knee but pt was able to self correct. Reviewed TKA HEP. Pt is not yet able to perform R SLR.     Recommendations for follow up therapy are one component of a multi-disciplinary discharge planning process, led by the attending physician.  Recommendations may be updated based on patient status, additional functional criteria and insurance authorization.  Follow Up Recommendations  Skilled nursing-short term rehab (<3 hours/day) Can patient physically be transported by private vehicle: Yes   Assistance Recommended at Discharge Intermittent Supervision/Assistance  Patient can return home with the following A little help with walking and/or transfers;A little help with bathing/dressing/bathroom;Assistance with cooking/housework;Assist for transportation;Help with stairs or ramp for entrance   Equipment Recommendations  None recommended by PT    Recommendations for Other Services       Precautions / Restrictions Precautions Precautions: Fall;Knee Precaution Booklet Issued: Yes (comment) Precaution Comments: reviewed no pillow under knee Required Braces or Orthoses: Knee Immobilizer - Right Knee Immobilizer - Right: Discontinue once straight leg raise with < 10 degree lag Restrictions Weight Bearing Restrictions: No Other Position/Activity Restrictions: WBAT     Mobility  Bed Mobility Overal bed mobility: Modified Independent, Needs Assistance Bed Mobility: Sit to Supine, Supine to Sit     Supine to sit: Supervision, HOB elevated Sit to supine: Min assist   General bed mobility comments: min A RLE into bed    Transfers Overall  transfer level: Needs assistance Equipment used: Rolling walker (2 wheels) Transfers: Sit to/from Stand Sit to Stand: Supervision           General transfer comment: cues for LE management and use of UEs to self assist    Ambulation/Gait Ambulation/Gait assistance: Supervision Gait Distance (Feet): 220 Feet Assistive device: Rolling walker (2 wheels) Gait Pattern/deviations: Step-to pattern, Decreased step length - right, Decreased step length - left Gait velocity: decr     General Gait Details: Increased time, good sequencing, no loss of balance   Stairs             Wheelchair Mobility    Modified Rankin (Stroke Patients Only)       Balance Overall balance assessment: Mild deficits observed, not formally tested                                          Cognition Arousal/Alertness: Awake/alert Behavior During Therapy: WFL for tasks assessed/performed Overall Cognitive Status: Within Functional Limits for tasks assessed                                          Exercises Total Joint Exercises Ankle Circles/Pumps: AROM, Both, 15 reps, Supine Quad Sets: AROM, Both, Supine, 5 reps Short Arc Quad: AAROM, Right, 10 reps, Supine Hip ABduction/ADduction: AAROM, Right, 10 reps, Supine Straight Leg Raises: AAROM, Right, 10 reps, Supine Long Arc Quad: AAROM, Right, Seated, 5 reps Knee Flexion: AAROM, 10 reps, Seated    General  Comments        Pertinent Vitals/Pain Pain Assessment Pain Score: 6  Pain Location: R knee with walking Pain Descriptors / Indicators: Aching, Sore, Operative site guarding Pain Intervention(s): Limited activity within patient's tolerance, Monitored during session, Premedicated before session, Ice applied    Home Living                          Prior Function            PT Goals (current goals can now be found in the care plan section) Acute Rehab PT Goals Patient Stated Goal: REgain  IND, play tennis, go for long walks PT Goal Formulation: With patient Time For Goal Achievement: 01/16/22 Potential to Achieve Goals: Good Progress towards PT goals: Progressing toward goals    Frequency    7X/week      PT Plan Current plan remains appropriate    Co-evaluation              AM-PAC PT "6 Clicks" Mobility   Outcome Measure  Help needed turning from your back to your side while in a flat bed without using bedrails?: A Little Help needed moving from lying on your back to sitting on the side of a flat bed without using bedrails?: A Little Help needed moving to and from a bed to a chair (including a wheelchair)?: A Little Help needed standing up from a chair using your arms (e.g., wheelchair or bedside chair)?: None Help needed to walk in hospital room?: None Help needed climbing 3-5 steps with a railing? : A Little 6 Click Score: 20    End of Session Equipment Utilized During Treatment: Gait belt;Right knee immobilizer Activity Tolerance: Patient tolerated treatment well Patient left: with call bell/phone within reach;in chair;with nursing/sitter in room Nurse Communication: Mobility status PT Visit Diagnosis: Difficulty in walking, not elsewhere classified (R26.2)     Time: 0321-2248 PT Time Calculation (min) (ACUTE ONLY): 38 min  Charges:  $Gait Training: 8-22 mins $Therapeutic Exercise: 8-22 mins $Therapeutic Activity: 8-22 mins                     Blondell Reveal Kistler PT 01/14/2022  Acute Rehabilitation Services  Office [phone]

## 2022-01-14 NOTE — Progress Notes (Signed)
Physical Therapy Treatment Patient Details Name: Sierra Perez MRN: [id number] DOB: 08-19-1960 Today's Date: 01/14/2022   History of Present Illness Pt s/p R TKR and with hx of DM, MVP, Fibromyalgia, L TKR, R THR    PT Comments    Pt ascended and descended  3 + 2 stairs with L rail and R cane with verbal cues for sequencing. Pt ambulated 150' with RW, no buckling of RLE, 7/10 pain. She is progressing well with mobility.     Recommendations for follow up therapy are one component of a multi-disciplinary discharge planning process, led by the attending physician.  Recommendations may be updated based on patient status, additional functional criteria and insurance authorization.  Follow Up Recommendations  Follow physician's recommendations for discharge plan and follow up therapies Can patient physically be transported by private vehicle: Yes   Assistance Recommended at Discharge Intermittent Supervision/Assistance  Patient can return home with the following A little help with walking and/or transfers;A little help with bathing/dressing/bathroom;Assistance with cooking/housework;Assist for transportation;Help with stairs or ramp for entrance   Equipment Recommendations  None recommended by PT    Recommendations for Other Services       Precautions / Restrictions Precautions Precautions: Fall;Knee Precaution Booklet Issued: Yes (comment) Precaution Comments: reviewed no pillow under knee Required Braces or Orthoses: Knee Immobilizer - Right Knee Immobilizer - Right: Discontinue once straight leg raise with < 10 degree lag Restrictions Weight Bearing Restrictions: No Other Position/Activity Restrictions: WBAT     Mobility  Bed Mobility Overal bed mobility: Modified Independent, Needs Assistance Bed Mobility: Sit to Supine     Supine to sit: Supervision, HOB elevated Sit to supine: Min assist   General bed mobility comments: min A RLE into bed    Transfers Overall  transfer level: Needs assistance Equipment used: Rolling walker (2 wheels) Transfers: Sit to/from Stand Sit to Stand: Supervision           General transfer comment: supervision for safety    Ambulation/Gait Ambulation/Gait assistance: Supervision Gait Distance (Feet): 150 Feet Assistive device: Rolling walker (2 wheels) Gait Pattern/deviations: Step-to pattern, Decreased step length - right, Decreased step length - left Gait velocity: decr     General Gait Details: Increased time, good sequencing, no loss of balance   Stairs Stairs: Yes Stairs assistance: Min guard Stair Management: Step to pattern, Forwards, One rail Left, With cane Number of Stairs: 5 General stair comments: VCs sequencing, no loss of balance nor buckling   Wheelchair Mobility    Modified Rankin (Stroke Patients Only)       Balance Overall balance assessment: Mild deficits observed, not formally tested                                          Cognition Arousal/Alertness: Awake/alert Behavior During Therapy: WFL for tasks assessed/performed Overall Cognitive Status: Within Functional Limits for tasks assessed                                          Exercises Total Joint Exercises  Long Arc Quad: AAROM, Right, Seated, 5 reps Knee Flexion: AAROM, 10 reps, Seated Goniometric ROM: 5-65* AAROM R knee    General Comments        Pertinent Vitals/Pain Pain Assessment Pain Score: 7  Pain Location: R knee  with walking Pain Descriptors / Indicators: Aching, Sore, Operative site guarding Pain Intervention(s): Limited activity within patient's tolerance, Monitored during session, Patient requesting pain meds-RN notified, Ice applied    Home Living                          Prior Function            PT Goals (current goals can now be found in the care plan section) Acute Rehab PT Goals Patient Stated Goal: REgain IND, play tennis, go for long  walks PT Goal Formulation: With patient Time For Goal Achievement: 01/16/22 Potential to Achieve Goals: Good Progress towards PT goals: Progressing toward goals    Frequency    7X/week      PT Plan Current plan remains appropriate    Co-evaluation              AM-PAC PT "6 Clicks" Mobility   Outcome Measure  Help needed turning from your back to your side while in a flat bed without using bedrails?: A Little Help needed moving from lying on your back to sitting on the side of a flat bed without using bedrails?: A Little Help needed moving to and from a bed to a chair (including a wheelchair)?: None Help needed standing up from a chair using your arms (e.g., wheelchair or bedside chair)?: None Help needed to walk in hospital room?: None Help needed climbing 3-5 steps with a railing? : A Little 6 Click Score: 21    End of Session Equipment Utilized During Treatment: Gait belt;Right knee immobilizer Activity Tolerance: Patient tolerated treatment well Patient left: with call bell/phone within reach;in chair;with nursing/sitter in room Nurse Communication: Mobility status PT Visit Diagnosis: Difficulty in walking, not elsewhere classified (R26.2)     Time: 4917-9150 PT Time Calculation (min) (ACUTE ONLY): 44 min  Charges:  $Gait Training: 8-22 mins $Therapeutic Exercise: 8-22 mins $Therapeutic Activity: 8-22 mins                     Blondell Reveal Kistler PT 01/14/2022  Acute Rehabilitation Services  Office [phone]

## 2022-01-14 NOTE — Progress Notes (Signed)
Patient ID: Sierra Perez, female   DOB: 1960/04/07, 61 y.o.   MRN: [id number] Awaiting insurance authorization for short-term skilled nursing placement.  Can be discharged when this happens.

## 2022-01-15 DIAGNOSIS — M1711 Unilateral primary osteoarthritis, right knee: Secondary | ICD-10-CM | POA: Diagnosis not present

## 2022-01-15 MED ORDER — HYDROMORPHONE HCL 2 MG PO TABS
4.0000 mg | ORAL_TABLET | ORAL | Status: DC | PRN
  Administered 2022-01-15 – 2022-01-16 (×4): 4 mg via ORAL
  Filled 2022-01-15 (×4): qty 2

## 2022-01-15 MED ORDER — BISACODYL 10 MG RE SUPP
10.0000 mg | Freq: Every day | RECTAL | Status: DC | PRN
  Administered 2022-01-15: 10 mg via RECTAL
  Filled 2022-01-15: qty 1

## 2022-01-15 NOTE — Progress Notes (Signed)
Patient ID: Sierra Perez, female   DOB: 1960/11/06, 61 y.o.   MRN: [id number] The patient continues to make progress with therapy.  Hunter with PT is working with her now.  She does live alone and she is still hoping to qualify for short-term skilled nursing.  She understands that that may not happen in terms of insurance authorization.  Hopefully we will hear something today so we can make plans of setting her up for home therapy and nursing needs if it comes to that.  Her vital signs are stable.  Her right operative knee is stable.  She is not flexing it much which is worrisome.  I encouraged her is much as I could.

## 2022-01-15 NOTE — Progress Notes (Signed)
Physical Therapy Treatment Patient Details Name: Sierra Perez MRN: [id number] DOB: 03-15-1960 Today's Date: 01/15/2022   History of Present Illness Pt s/p R TKR and with hx of DM, MVP, Fibromyalgia, L TKR, R THR    PT Comments    Pt continues steady progress toward independence   Recommendations for follow up therapy are one component of a multi-disciplinary discharge planning process, led by the attending physician.  Recommendations may be updated based on patient status, additional functional criteria and insurance authorization.  Follow Up Recommendations  Follow physician's recommendations for discharge plan and follow up therapies Can patient physically be transported by private vehicle: Yes   Assistance Recommended at Discharge Intermittent Supervision/Assistance  Patient can return home with the following A little help with walking and/or transfers;A little help with bathing/dressing/bathroom;Assistance with cooking/housework;Assist for transportation;Help with stairs or ramp for entrance   Equipment Recommendations  None recommended by PT    Recommendations for Other Services       Precautions / Restrictions Precautions Precautions: Fall;Knee Precaution Booklet Issued: Yes (comment) Precaution Comments: reviewed no pillow under knee Required Braces or Orthoses: Knee Immobilizer - Right Knee Immobilizer - Right: Discontinue once straight leg raise with < 10 degree lag (Pt performed SLR this am) Restrictions Weight Bearing Restrictions: No Other Position/Activity Restrictions: WBAT     Mobility  Bed Mobility Overal bed mobility: Modified Independent, Needs Assistance Bed Mobility: Supine to Sit     Supine to sit: Supervision, HOB elevated     General bed mobility comments: min cues for technique    Transfers Overall transfer level: Needs assistance Equipment used: Rolling walker (2 wheels) Transfers: Sit to/from Stand Sit to Stand: Supervision            General transfer comment: supervision for safety    Ambulation/Gait Ambulation/Gait assistance: Supervision Gait Distance (Feet): 150 Feet (twice) Assistive device: Rolling walker (2 wheels) Gait Pattern/deviations: Step-to pattern, Decreased step length - right, Decreased step length - left Gait velocity: decr     General Gait Details: Increased time, good sequencing, no loss of balance   Stairs             Wheelchair Mobility    Modified Rankin (Stroke Patients Only)       Balance Overall balance assessment: Mild deficits observed, not formally tested                                          Cognition Arousal/Alertness: Awake/alert Behavior During Therapy: WFL for tasks assessed/performed Overall Cognitive Status: Within Functional Limits for tasks assessed                                          Exercises Total Joint Exercises Ankle Circles/Pumps: AROM, Both, 15 reps, Supine Quad Sets: AROM, Both, Supine, 10 reps Heel Slides: AAROM, Right, Supine, 20 reps Hip ABduction/ADduction: Right, 10 reps, Supine, AAROM, AROM Straight Leg Raises: AAROM, AROM, 15 reps, Supine Long Arc Quad: AAROM, Right, Seated, 10 reps Knee Flexion: AAROM, Seated, 5 reps    General Comments        Pertinent Vitals/Pain Pain Assessment Pain Assessment: 0-10 Pain Score: 6  Pain Location: R knee with walking Pain Descriptors / Indicators: Aching, Sore, Operative site guarding Pain Intervention(s): Limited activity within patient's tolerance, Monitored during  session, Premedicated before session, Ice applied    Home Living                          Prior Function            PT Goals (current goals can now be found in the care plan section) Acute Rehab PT Goals Patient Stated Goal: REgain IND, play tennis, go for long walks PT Goal Formulation: With patient Time For Goal Achievement: 01/16/22 Potential to Achieve Goals:  Good Progress towards PT goals: Progressing toward goals    Frequency    7X/week      PT Plan Current plan remains appropriate    Co-evaluation              AM-PAC PT "6 Clicks" Mobility   Outcome Measure  Help needed turning from your back to your side while in a flat bed without using bedrails?: A Little Help needed moving from lying on your back to sitting on the side of a flat bed without using bedrails?: A Little Help needed moving to and from a bed to a chair (including a wheelchair)?: None Help needed standing up from a chair using your arms (e.g., wheelchair or bedside chair)?: None Help needed to walk in hospital room?: None Help needed climbing 3-5 steps with a railing? : A Little 6 Click Score: 21    End of Session Equipment Utilized During Treatment: Gait belt Activity Tolerance: Patient tolerated treatment well Patient left: with call bell/phone within reach;in chair;with nursing/sitter in room Nurse Communication: Mobility status PT Visit Diagnosis: Difficulty in walking, not elsewhere classified (R26.2)     Time: 3267-1245 PT Time Calculation (min) (ACUTE ONLY): 59 min  Charges:  $Gait Training: 8-22 mins $Therapeutic Exercise: 8-22 mins $Therapeutic Activity: 8-22 mins                     Debe Coder PT Acute Rehabilitation Services Pager [phone] Office [phone]    Sierra Perez 01/15/2022, 11:25 AM

## 2022-01-15 NOTE — Progress Notes (Signed)
Physical Therapy Treatment Patient Details Name: Sierra Perez MRN: [id number] DOB: 1960/03/19 Today's Date: 01/15/2022   History of Present Illness Pt s/p R TKR and with hx of DM, MVP, Fibromyalgia, L TKR, R THR    PT Comments    Pt continues steady progress with mobility and hopeful for dc home tomorrow.   Recommendations for follow up therapy are one component of a multi-disciplinary discharge planning process, led by the attending physician.  Recommendations may be updated based on patient status, additional functional criteria and insurance authorization.  Follow Up Recommendations  Follow physician's recommendations for discharge plan and follow up therapies Can patient physically be transported by private vehicle: Yes   Assistance Recommended at Discharge Intermittent Supervision/Assistance  Patient can return home with the following A little help with walking and/or transfers;A little help with bathing/dressing/bathroom;Assistance with cooking/housework;Assist for transportation;Help with stairs or ramp for entrance   Equipment Recommendations  None recommended by PT    Recommendations for Other Services       Precautions / Restrictions Precautions Precautions: Fall;Knee Required Braces or Orthoses: Knee Immobilizer - Right Knee Immobilizer - Right: Discontinue once straight leg raise with < 10 degree lag (pt performed IND SLR this am) Restrictions Weight Bearing Restrictions: No Other Position/Activity Restrictions: WBAT     Mobility  Bed Mobility Overal bed mobility: Modified Independent Bed Mobility: Supine to Sit, Sit to Supine     Supine to sit: Supervision, Modified independent (Device/Increase time) Sit to supine: Modified independent (Device/Increase time), Supervision   General bed mobility comments: min cues for technique; use of gait belt to bring R LE onto bed    Transfers Overall transfer level: Needs assistance Equipment used: Rolling walker (2  wheels) Transfers: Sit to/from Stand Sit to Stand: Supervision           General transfer comment: supervision for safety    Ambulation/Gait Ambulation/Gait assistance: Supervision Gait Distance (Feet): 185 Feet (twice) Assistive device: Rolling walker (2 wheels) Gait Pattern/deviations: Step-to pattern, Decreased step length - right, Decreased step length - left Gait velocity: decr     General Gait Details: Increased time, good sequencing, no loss of balance   Stairs Stairs: Yes Stairs assistance: Min guard Stair Management: Step to pattern, Forwards, One rail Left, With cane Number of Stairs: 4 General stair comments: VCs sequencing, no loss of balance nor buckling   Wheelchair Mobility    Modified Rankin (Stroke Patients Only)       Balance Overall balance assessment: Mild deficits observed, not formally tested                                          Cognition Arousal/Alertness: Awake/alert Behavior During Therapy: WFL for tasks assessed/performed Overall Cognitive Status: Within Functional Limits for tasks assessed                                          Exercises Total Joint Exercises Ankle Circles/Pumps: AROM, Both, 15 reps, Supine Quad Sets: AROM, Both, Supine, 10 reps Heel Slides: AAROM, Right, Supine, 20 reps Hip ABduction/ADduction: Right, 10 reps, Supine, AAROM, AROM Straight Leg Raises: AAROM, AROM, 15 reps, Supine Long Arc Quad: AAROM, Right, Seated, 10 reps Knee Flexion: AAROM, Seated, 5 reps    General Comments  Pertinent Vitals/Pain Pain Assessment Pain Assessment: 0-10 Pain Score: 5  Pain Location: R knee with walking Pain Descriptors / Indicators: Aching, Sore, Operative site guarding Pain Intervention(s): Limited activity within patient's tolerance, Monitored during session, Premedicated before session, Ice applied    Home Living                          Prior Function             PT Goals (current goals can now be found in the care plan section) Acute Rehab PT Goals Patient Stated Goal: REgain IND, play tennis, go for long walks PT Goal Formulation: With patient Time For Goal Achievement: 01/16/22 Potential to Achieve Goals: Good Progress towards PT goals: Progressing toward goals    Frequency    7X/week      PT Plan Current plan remains appropriate    Co-evaluation              AM-PAC PT "6 Clicks" Mobility   Outcome Measure  Help needed turning from your back to your side while in a flat bed without using bedrails?: A Little Help needed moving from lying on your back to sitting on the side of a flat bed without using bedrails?: A Little Help needed moving to and from a bed to a chair (including a wheelchair)?: None Help needed standing up from a chair using your arms (e.g., wheelchair or bedside chair)?: None Help needed to walk in hospital room?: None Help needed climbing 3-5 steps with a railing? : A Little 6 Click Score: 21    End of Session Equipment Utilized During Treatment: Gait belt Activity Tolerance: Patient tolerated treatment well Patient left: with call bell/phone within reach;in bed;with bed alarm set Nurse Communication: Mobility status PT Visit Diagnosis: Difficulty in walking, not elsewhere classified (R26.2)     Time: 1655-3748 PT Time Calculation (min) (ACUTE ONLY): 48 min  Charges:  $Gait Training: 23-37 mins                     [place] Pager [phone] Office [phone]    Athalee Esterline 01/15/2022, 6:07 PM

## 2022-01-15 NOTE — TOC Transition Note (Signed)
Transition of Care [facility]) - CM/SW Discharge Note   Patient Details  Name: Sierra Perez MRN: [id number] Date of Birth: 07-03-1960  [facility] CM/SW Contact:  Lennart Pall, LCSW Phone Number: 01/15/2022, 2:07 PM   Clinical Narrative:     Have informed pt and MD that insurance has issued denial for SNF coverage.  Pt agreeable, given progress she has made here, with change of dc plan back to home with HHPT follow up.  Has all needed DME at home. [place] set up to provide HHPT follow up.  No further TOC needs.  Final next level of care: [place] Barriers to Discharge: Barriers Resolved   Patient Goals and CMS Choice Patient states their goals for this hospitalization and ongoing recovery are:: anticipates need for SNF rehab prior to return home      Discharge Placement                       Discharge Plan and Services In-house Referral: Clinical Social Work              DME Arranged: N/A DME Agency: NA       HH Arranged: PT HH Agency: [place] Date [place]: 01/15/22 Time [place]: 1406 Representative spoke with at [place]: Claiborne Billings  Social Determinants of Health ([place]) Interventions     Readmission Risk Interventions     No data to display

## 2022-01-16 DIAGNOSIS — M1711 Unilateral primary osteoarthritis, right knee: Secondary | ICD-10-CM | POA: Diagnosis not present

## 2022-01-16 MED ORDER — METHOCARBAMOL 500 MG PO TABS: 500.0000 mg | ORAL_TABLET | Freq: Four times a day (QID) | ORAL | 1 refills | Status: DC | PRN

## 2022-01-16 MED ORDER — GABAPENTIN 600 MG PO TABS: 600.0000 mg | ORAL_TABLET | Freq: Three times a day (TID) | ORAL | 0 refills | Status: DC

## 2022-01-16 MED ORDER — HYDROMORPHONE HCL 4 MG PO TABS: 4.0000 mg | ORAL_TABLET | ORAL | 0 refills | Status: DC | PRN

## 2022-01-16 NOTE — Discharge Summary (Signed)
Patient ID: Sierra Perez MRN: [id number] DOB/AGE: [birth year] 61 y.o.  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, her-2 positive, ER/PR negative   Clotting disorder ([place])    testing was negative, h/o DVT while on treatment   Colon polyps    Complex regional pain syndrome i of right lower limb    RDS   Complication of anesthesia    Cystitis    Diabetes mellitus without complication ([place])    DVT (deep venous thrombosis) ([place])    started in 2015, multiple   Family history of breast cancer    Family history of colon cancer    Fibroid    Fibromyalgia    GERD (gastroesophageal reflux disease)    Heart murmur    HLD (hyperlipidemia)    HPV in female    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 female who was admitted 01/09/2022 for operative treatment ofUnilateral primary osteoarthritis, right knee. Patient has severe unremitting pain that affects sleep, daily activities, and work/hobbies. After pre-op clearance the patient was taken to the operating room on 01/09/2022 and underwent  Procedure(s): RIGHT TOTAL KNEE ARTHROPLASTY.    Patient was given perioperative antibiotics:  Anti-infectives (From admission, onward)    Start     Dose/Rate Route  Frequency Ordered Stop   01/09/22 2200  vancomycin (VANCOCIN) IVPB 1000 mg/200 mL premix        1,000 mg 200 mL/hr over 60 Minutes Intravenous Every 12 hours 01/09/22 1540 01/09/22 2235   01/09/22 0915  ceFAZolin (ANCEF) IVPB 2g/100 mL premix        2 g 200 mL/hr over 30 Minutes Intravenous On call to O.R. 01/09/22 0901 01/09/22 1129        Patient was given sequential compression devices, early ambulation, and chemoprophylaxis to prevent DVT.  Patient benefited maximally from hospital stay and there were no complications.    Recent vital signs: Patient Vitals for the past 24 hrs:  BP Temp Temp src Pulse Resp SpO2  01/16/22 0433 110/60 98.4 F (36.9 C) Oral 82 18 97 %  01/15/22 2117 125/70 99.2 F (37.3 C) Oral 92 18 99 %  01/15/22 1330 124/70 98.2 F (36.8 C) Oral 90 16 100 %     Recent laboratory studies: No results for input(s): "WBC", "HGB", "HCT", "PLT", "NA", "K", "CL", "CO2", "BUN", "CREATININE", "GLUCOSE", "INR", "CALCIUM" in the last 72 hours.  Invalid input(s): "PT", "2"   Discharge Medications:   Allergies as of 01/16/2022       Reactions   Succinylcholine Other (See Comments)   Muscle aching 18 years ago from 07/02/20   Azithromycin    **DOES NOT WORK** **DOES NOT WORK**  Keflex [cephalexin] Diarrhea, Nausea And Vomiting   Silver Rash   Sulfa Antibiotics Rash   And swelling generalized not in throat   Sulfasalazine Rash        Medication List     STOP taking these medications    enoxaparin 80 MG/0.8ML injection Commonly known as: Lovenox   oxyCODONE 5 MG immediate release tablet Commonly known as: Roxicodone       TAKE these medications    acetaminophen 500 MG tablet Commonly known as: TYLENOL Take 1,000 mg by mouth every 6 (six) hours as needed for moderate pain.   albuterol 108 (90 Base) MCG/ACT inhaler Commonly known as: VENTOLIN HFA Inhale 1 puff into the lungs every 6 (six) hours as needed for wheezing or shortness of breath.    amLODipine 5 MG tablet Commonly known as: NORVASC Take 5 mg by mouth daily.   anastrozole 1 MG tablet Commonly known as: ARIMIDEX Take 1 tablet (1 mg total) by mouth daily.   CALCIUM CITRATE PO Take 2 tablets by mouth daily.   CULTURELLE PO Take 1 capsule by mouth daily.   DULoxetine 60 MG capsule Commonly known as: CYMBALTA Take 60 mg by mouth daily.   DULoxetine 30 MG capsule Commonly known as: CYMBALTA 30 mg daily.   Eliquis 5 MG Tabs tablet Generic drug: apixaban TAKE 1 TABLET(5 MG) BY MOUTH TWICE DAILY   fluticasone 50 MCG/ACT nasal spray Commonly known as: FLONASE Place 2 sprays into both nostrils daily.   gabapentin 600 MG tablet Commonly known as: NEURONTIN Take 1 tablet (600 mg total) by mouth 3 (three) times daily. What changed: when to take this   HYDROmorphone 4 MG tablet Commonly known as: DILAUDID Take 1 tablet (4 mg total) by mouth every 4 (four) hours as needed for severe pain (pain score 7-10).   ipratropium 0.03 % nasal spray Commonly known as: ATROVENT Place 2 sprays into both nostrils 2 (two) times daily.   levothyroxine 50 MCG tablet Commonly known as: SYNTHROID Take 50 mcg by mouth every morning.   methocarbamol 500 MG tablet Commonly known as: ROBAXIN Take 1 tablet (500 mg total) by mouth every 6 (six) hours as needed for muscle spasms.   Mounjaro 10 MG/0.5ML Pen Generic drug: tirzepatide Inject 10 mg into the skin once a week.   NON FORMULARY Pt uses a cpap nightly   pantoprazole 40 MG tablet Commonly known as: Protonix Take 1 tablet (40 mg total) by mouth daily.   rosuvastatin 5 MG tablet Commonly known as: CRESTOR Take 1 tablet (5 mg total) by mouth daily.   valACYclovir 500 MG tablet Commonly known as: VALTREX Take 1 tablet (500 mg total) by mouth daily. Increase to bid x 3 days with symptoms What changed:  when to take this reasons to take this   Vitamin D-3 25 MCG (1000 UT) Caps Take 1,000 Units by mouth daily.                Durable Medical Equipment  (From admission, onward)           Start     Ordered   01/09/22 1541  DME 3 n 1  Once        01/09/22 1540   01/09/22 1541  DME Walker rolling  Once       Question Answer Comment  Walker: With 5 Inch Wheels   Patient needs a walker to treat with the following condition Status post total right knee replacement  01/09/22 1540            Diagnostic Studies: DG Knee Right Port  Result Date: 01/09/2022 CLINICAL DATA:  Status post total RIGHT knee replacement EXAM: PORTABLE RIGHT KNEE - 1-2 VIEW COMPARISON:  None Available. FINDINGS: Total knee arthroplasty. Prosthetic components are well seated. Expected soft tissue changes in the anterior knee. IMPRESSION: No complication following total knee arthroplasty Electronically Signed   By: Suzy Bouchard M.D.   On: 01/09/2022 13:46   [place] LT UPPER EXTREM LTD SOFT TISSUE NON VASCULAR  Result Date: 12/19/2021 CLINICAL DATA:  Left upper extremity swelling for 3 weeks. Anti coagulation. EXAM: ULTRASOUND left UPPER EXTREMITY LIMITED TECHNIQUE: Ultrasound examination of the upper extremity soft tissues was performed in the area of clinical concern. COMPARISON:  None FINDINGS: In the area of interest an ill-defined mildly heterogeneous but primarily hypoechoic lesion without substantial internal Doppler flow measures about 2.2 by 2.8 by 0.8 cm (volume = 3 cm^3). This could reflect a local hematoma in the subcutaneous tissues. IMPRESSION: 1. 3 cc mildly complex collection corresponding to the focal swelling. Based on location and clinical scenario this could reflect subcutaneous hematoma or olecranon bursitis. Electronically Signed   By: Van Clines M.D.   On: 12/19/2021 13:07    Disposition: Discharge disposition: 01-Home or Self Care          Contact information for follow-up providers     Mcarthur Rossetti, MD Follow up in 2 week(s).   Specialty: Orthopedic Surgery Contact  information: [place] [address] [place], [place] Follow up.   Specialty: [place] Why: to provide home physical therapy visits Contact information: [place] [address] [phone]              Contact information for after-discharge care     Destination     [facility] Preferred SNF .   Service: Skilled Nursing Contact information: [place] [phone]                      Signed: Mcarthur Rossetti 01/16/2022, 7:23 AM

## 2022-01-16 NOTE — Progress Notes (Signed)
Patient ID: Sierra Perez, female   DOB: October 01, 1960, 61 y.o.   MRN: [id number] At this point, the plan is to discharge the patient to home today.  She is doing well and mobilizing well.  Her right operative knee is stable and her vital signs are stable.  She understands this as well.  I appreciate the transitional care team and social work working closely with this patient to get things set up for her.

## 2022-01-16 NOTE — Progress Notes (Signed)
Physical Therapy Treatment Patient Details Name: Sierra Perez MRN: [id number] DOB: Jun 29, 1960 Today's Date: 01/16/2022   History of Present Illness Pt s/p R TKR and with hx of DM, MVP, Fibromyalgia, L TKR, R THR    PT Comments    Pt in good spirits and feeling more confident with dc to home.  Pt up to ambulate in hall and performed HEP with written instruction provided.  Pt with multiple questions asked and answered.   Recommendations for follow up therapy are one component of a multi-disciplinary discharge planning process, led by the attending physician.  Recommendations may be updated based on patient status, additional functional criteria and insurance authorization.  Follow Up Recommendations  Follow physician's recommendations for discharge plan and follow up therapies Can patient physically be transported by private vehicle: Yes   Assistance Recommended at Discharge Intermittent Supervision/Assistance  Patient can return home with the following A little help with walking and/or transfers;A little help with bathing/dressing/bathroom;Assistance with cooking/housework;Assist for transportation;Help with stairs or ramp for entrance   Equipment Recommendations  None recommended by PT    Recommendations for Other Services       Precautions / Restrictions Precautions Precautions: Fall;Knee Restrictions Weight Bearing Restrictions: No Other Position/Activity Restrictions: WBAT     Mobility  Bed Mobility Overal bed mobility: Modified Independent             General bed mobility comments: Pt up in chair and requests back to same    Transfers Overall transfer level: Needs assistance Equipment used: Rolling walker (2 wheels) Transfers: Sit to/from Stand Sit to Stand: Modified independent (Device/Increase time)           General transfer comment: Pt self cues for sequence    Ambulation/Gait Ambulation/Gait assistance: Modified independent (Device/Increase  time) Gait Distance (Feet): 100 Feet Assistive device: Rolling walker (2 wheels) Gait Pattern/deviations: Step-to pattern, Step-through pattern, Decreased step length - right, Decreased step length - left, Shuffle, Trunk flexed Gait velocity: decr     General Gait Details: Increased time, good sequencing, no loss of balance   Stairs         General stair comments: Pt states comfortable with stairs from previous sessions   Wheelchair Mobility    Modified Rankin (Stroke Patients Only)       Balance Overall balance assessment: Mild deficits observed, not formally tested                                          Cognition Arousal/Alertness: Awake/alert Behavior During Therapy: WFL for tasks assessed/performed Overall Cognitive Status: Within Functional Limits for tasks assessed                                          Exercises Total Joint Exercises Ankle Circles/Pumps: AROM, Both, 15 reps, Supine Quad Sets: AROM, Both, Supine, 10 reps Heel Slides: AAROM, Right, Supine, 20 reps Hip ABduction/ADduction: Right, Supine, AAROM, AROM, 15 reps Straight Leg Raises: AAROM, AROM, Supine, 20 reps Knee Flexion: AAROM, Seated, 10 reps    General Comments        Pertinent Vitals/Pain Pain Assessment Pain Assessment: 0-10 Pain Score: 5  Pain Location: R knee Pain Descriptors / Indicators: Aching, Sore, Operative site guarding Pain Intervention(s): Limited activity within patient's tolerance, Monitored during session, Premedicated before session,  Ice applied    Home Living                          Prior Function            PT Goals (current goals can now be found in the care plan section) Acute Rehab PT Goals Patient Stated Goal: REgain IND, play tennis, go for long walks PT Goal Formulation: With patient Time For Goal Achievement: 01/16/22 Potential to Achieve Goals: Good Progress towards PT goals: Progressing toward  goals    Frequency    7X/week      PT Plan Current plan remains appropriate    Co-evaluation              AM-PAC PT "6 Clicks" Mobility   Outcome Measure  Help needed turning from your back to your side while in a flat bed without using bedrails?: A Little Help needed moving from lying on your back to sitting on the side of a flat bed without using bedrails?: A Little Help needed moving to and from a bed to a chair (including a wheelchair)?: None Help needed standing up from a chair using your arms (e.g., wheelchair or bedside chair)?: None Help needed to walk in hospital room?: None Help needed climbing 3-5 steps with a railing? : A Little 6 Click Score: 21    End of Session Equipment Utilized During Treatment: Gait belt Activity Tolerance: Patient tolerated treatment well Patient left: in chair;with call bell/phone within reach;with family/visitor present Nurse Communication: Mobility status PT Visit Diagnosis: Difficulty in walking, not elsewhere classified (R26.2)     Time: 1040-1130 PT Time Calculation (min) (ACUTE ONLY): 50 min  Charges:  $Gait Training: 8-22 mins $Therapeutic Exercise: 8-22 mins $Therapeutic Activity: 8-22 mins                     Debe Coder PT Acute Rehabilitation Services Pager [phone] Office [phone]    Sierra Perez 01/16/2022, 12:59 PM

## 2022-01-16 NOTE — Progress Notes (Signed)
Reviewed written d/c instructions w pt and multiple questions answered (esp regarding pain pills and muscle relaxers). She verbalized understanding. D/C via w/c w all belongings in stable condition.

## 2022-01-19 ENCOUNTER — Telehealth: Payer: Self-pay | Admitting: Pharmacist

## 2022-01-19 ENCOUNTER — Other Ambulatory Visit: Payer: Self-pay | Admitting: Hematology and Oncology

## 2022-01-19 ENCOUNTER — Telehealth: Payer: Self-pay | Admitting: Orthopedic Surgery

## 2022-01-19 ENCOUNTER — Telehealth: Payer: Self-pay | Admitting: Orthopaedic Surgery

## 2022-01-19 NOTE — Telephone Encounter (Signed)
Patient states she is waiting on PT to contact her and has not been called. Patient is concerned that her knee will lock up. Please call..[phone]

## 2022-01-19 NOTE — Telephone Encounter (Signed)
Orthopedic Telephone Call  Patient called the hotline tonight because she has had a fever of 102 today. Has had cough and runny nose. Lost her discharge paperwork so she was not sure if this was concerning. She is not having any worsening pain in the knee. The swelling has been stable. There is no redness around the knee. She is still able to take the knee through range of motion. There is no drainage. Since she seems to have a URI with no concerning knee symptoms, I recommended she try tylenol and continue to monitor. Should she develop any of the knee symptoms above, I instructed her to call the hotline again and she will get further instructions. I also told her if this does not resolve in the next day or two, she should also call. She is going to see Artis Delay on Thursday. I said she should keep that appointment unless symptoms change, then myself or somebody else may have to see her sooner. She understood the plan and had no further questions.   Callie Fielding, MD Orthopedic Surgeon

## 2022-01-19 NOTE — Telephone Encounter (Signed)
HHPT has contacted her per Jerry/[facility]

## 2022-01-19 NOTE — Telephone Encounter (Signed)
Pt called clinic, states she was told after her knee surgery that she's not eating and drinking enough. Reports having a lower dose of Mounjaro 7.'5mg'$  at home and is wondering if she should cut back from her '10mg'$  dose. Doesn't think it's completely from the [facility], reports overall feeling liess hungry from being in pain post-op. Emphasized the importance of healing after her surgery and advised her this is just fine to cut back on her Mounjaro dose. She'll let us know if she needs refills on either dose but thinks the pharmacy has refills on both rx's already.

## 2022-01-20 ENCOUNTER — Telehealth: Payer: Self-pay | Admitting: Physician Assistant

## 2022-01-20 ENCOUNTER — Telehealth: Payer: Self-pay | Admitting: Orthopaedic Surgery

## 2022-01-20 NOTE — Telephone Encounter (Signed)
Patient aware to keep bending that knee as much as possible She said it was really stiff and she is afraid of a manipulation, said she lives alone and has no help She has an appt Thursday and we can send her to out patient therapy if HHPT doesn't come out I have spoke to [place] and they are waiting for her insurance to approve

## 2022-01-20 NOTE — Telephone Encounter (Signed)
Patient returned call advised she can not bend her knee very much at all.  I advised patient to contact her insurance to se if she could get her HHPT moved along and approved.   Patient asked for a call back as soon as possible. The number to contact patient is [phone]

## 2022-01-20 NOTE — Telephone Encounter (Signed)
Pt called requesting a call back from [place]. Pt states she is doing the best she can with moving her knee until her approval for home health physical therapy but having trouble moving it and need to know what else to do. Pt phone number is [phone].

## 2022-01-20 NOTE — Telephone Encounter (Signed)
LMOM for patient that [facility] said unfortunately they are waiting for her insurance to approve HHPT But the most important thing is to BEND that knee as much as possible

## 2022-01-22 ENCOUNTER — Other Ambulatory Visit: Payer: Self-pay

## 2022-01-22 ENCOUNTER — Encounter: Payer: Self-pay | Admitting: Physician Assistant

## 2022-01-22 ENCOUNTER — Ambulatory Visit (INDEPENDENT_AMBULATORY_CARE_PROVIDER_SITE_OTHER): Payer: Self-pay | Admitting: Physician Assistant

## 2022-01-22 DIAGNOSIS — Z96651 Presence of right artificial knee joint: Secondary | ICD-10-CM

## 2022-01-22 MED ORDER — MAGIC MOUTHWASH: ORAL | 0 refills | Status: DC

## 2022-01-22 MED ORDER — ONDANSETRON HCL 4 MG PO TABS: 4.0000 mg | ORAL_TABLET | Freq: Three times a day (TID) | ORAL | 0 refills | Status: DC | PRN

## 2022-01-22 NOTE — Progress Notes (Signed)
HPI: Mr. Sierra Perez returns today status post right total knee arthroplasty 01/09/2022.  She states that she is concerned about her progress with bending.  She had left total knee arthroplasty was [place] in the state and had to undergo manipulation due to the fact that she was unable to get the knee moving.  She also lives alone.  She has had just a rough postoperative course.  She notes that she has had constipation followed by some diarrhea or fecal incontinence.  She also has had GI upset.  She has been trying to avoid narcotics due to the fact that she told by nursing staff at the hospital that she should avoid these because she could become addicted.  She also notes that her tongue feels a lot especially after drinking any water.   Review of systems see HPI otherwise negative noncontributory.  General: Well-developed well-nourished female no acute distress mood and affect appropriate. Respirations: Unlabored Psych: Alert and oriented x 3 ENT: Tongue appears to have slight thrush. Right knee: Surgical incisions healing well well-approximated no wound dehiscence.  No signs of infection.  Right calf supple nontender.  Dorsiflexion plantarflexion right ankle intact.  Full extension flexion to 70 degrees.  Impression: Status post right total knee arthroplasty Oral thrush Nausea  Plan: She is given Zofran 4 mg oral dissolving tablet every 8 hours.  Magic mouthwash swish and spit twice daily for 14 days.  Will set her up for outpatient therapy at [facility] to work on range of motion and strengthening the knee.  Continue with home therapy to work on range of motion and strengthening in the interim.  Scar tissue mobilization encouraged.  Follow-up with [place] in 1 month sooner if there is any questions concerns.

## 2022-01-23 ENCOUNTER — Telehealth: Payer: Self-pay | Admitting: Physician Assistant

## 2022-01-23 ENCOUNTER — Other Ambulatory Visit: Payer: Self-pay

## 2022-01-23 MED ORDER — MAGIC MOUTHWASH: ORAL | 0 refills | Status: DC

## 2022-01-23 NOTE — Addendum Note (Signed)
Addended by: Robyne Peers on: 01/23/2022 09:37 AM   Modules accepted: Orders

## 2022-01-23 NOTE — Addendum Note (Signed)
Addended by: Robyne Peers on: 01/23/2022 08:46 AM   Modules accepted: Orders

## 2022-01-27 ENCOUNTER — Encounter: Payer: Self-pay | Admitting: Orthopaedic Surgery

## 2022-02-02 ENCOUNTER — Encounter: Payer: Self-pay | Admitting: Hematology and Oncology

## 2022-02-03 NOTE — Therapy (Unsigned)
OUTPATIENT PHYSICAL THERAPY LOWER EXTREMITY EVALUATION   Patient Name: Sierra Perez MRN: [id number] DOB:1960/08/21, 62 y.o., female Today's Date: 02/04/2022  END OF SESSION:  PT End of Session - 02/04/22 1114     Visit Number 1    Date for PT Re-Evaluation 04/01/22    Authorization Type [employer]    Authorization Time Period 02/04/22 to 04/01/22    Authorization - Number of Visits 1    Progress Note Due on Visit 10    PT Start Time 1017    PT Stop Time 1113    PT Time Calculation (min) 56 min    Activity Tolerance No increased pain;Patient tolerated treatment well    Behavior During Therapy [facility] for tasks assessed/performed             Past Medical History:  Diagnosis Date   Anemia    Anxiety    Arthritis    Asthma    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, her-2 positive, ER/PR negative   Clotting disorder ([place])    testing was negative, h/o DVT while on treatment   Colon polyps    Complex regional pain syndrome i of right lower limb    RDS   Complication of anesthesia    Cystitis    Diabetes mellitus without complication ([place])    DVT (deep venous thrombosis) ([place])    started in 2015, multiple   Family history of breast cancer    Family history of colon cancer    Fibroid    Fibromyalgia    GERD (gastroesophageal reflux disease)    Heart murmur    HLD (hyperlipidemia)    HPV in female    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   Hypertension    Hypothyroidism    IBS (irritable bowel syndrome)    Mitral valve prolapse    Peptic ulcer    Personal history of malignant neoplasm of breast    Sleep apnea treated with continuous positive airway pressure (CPAP)    Past Surgical History:  Procedure Laterality Date   ABDOMINAL HYSTERECTOMY  2010   fibroids, h/o abnormal pap smears   AXILLARY SENTINEL NODE BIOPSY Right 07/08/2020   Procedure: RIGHT AXILLARY SENTINEL NODE BIOPSY;  Surgeon: Rolm Bookbinder, MD;  Location: [place];   Service: General;  Laterality: Right;   BREAST RECONSTRUCTION WITH PLACEMENT OF TISSUE EXPANDER AND FLEX HD (ACELLULAR HYDRATED DERMIS) Bilateral 07/08/2020   Procedure: BILATERAL BREAST RECONSTRUCTION WITH  FLEX HD (ACELLULAR HYDRATED DERMIS),DIRECT IMPLANT RECONSTRUCTION;  Surgeon: Cindra Presume, MD;  Location: [place];  Service: Plastics;  Laterality: Bilateral;   BREAST SURGERY Right 2015   right lumpectomy with reduction and lift   COLONOSCOPY     DENTAL SURGERY     ESOPHAGOGASTRODUODENOSCOPY     LAPAROSCOPIC GASTRIC SLEEVE RESECTION  2014   LAPAROSCOPIC OOPHERECTOMY Bilateral 2016   REPLACEMENT TOTAL KNEE Left 2018   also had 3 prior arthroscopies   TOE SURGERY Right    dislocated toe   TOTAL HIP ARTHROPLASTY Right 11/19/2020   Procedure: RIGHT TOTAL HIP ARTHROPLASTY ANTERIOR APPROACH;  Surgeon: Mcarthur Rossetti, MD;  Location: [place];  Service: Orthopedics;  Laterality: Right;   TOTAL KNEE ARTHROPLASTY Right 01/09/2022   Procedure: RIGHT TOTAL KNEE ARTHROPLASTY;  Surgeon: Mcarthur Rossetti, MD;  Location: [facility];  Service: Orthopedics;  Laterality: Right;   TOTAL MASTECTOMY Bilateral 07/08/2020   Procedure: BILATERAL TOTAL MASTECTOMY;  Surgeon: Rolm Bookbinder, MD;  Location: [facility];  Service: General;  Laterality: Bilateral;   VENA CAVA FILTER PLACEMENT  2017   Patient Active Problem List   Diagnosis Date Noted   Status post total right knee replacement 01/09/2022   Obesity (BMI 30-39.9) 06/23/2021   Iron deficiency anemia 01/23/2021   Status post right hip replacement 12/03/2020   Status post total replacement of right hip 11/19/2020   Breast cancer ([place]) 07/08/2020   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   H/O total hysterectomy 06/11/2020   Genetic testing 05/27/2020   Chronic superficial gastritis 05/27/2020   Fibromyalgia 05/27/2020   GERD (gastroesophageal reflux disease) 05/27/2020   Prediabetes 05/27/2020   Complex regional pain syndrome I, unspecified  05/27/2020   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 05/23/2020   History of therapeutic radiation 05/23/2020   Family history of breast cancer    Family history of colon cancer    Personal history of malignant neoplasm of breast    Unilateral primary osteoarthritis, right knee 02/26/2020   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   DVT (deep venous thrombosis) ([place]) 05/04/2018   Malignant neoplasm of upper-outer quadrant of right breast in female, estrogen receptor negative ([place]) 05/03/2018   Obstructive sleep apnea (adult) (pediatric) 09/14/2017   Adenopathy 07/15/2017   B12 deficiency 07/15/2017   Disorder of tympanic membrane of right ear 07/15/2017   Recurrent UTI 03/09/2017   Bladder pain 02/25/2017   Proteinuria 02/25/2017   Hematoma of thigh, left, subsequent encounter 08/16/2016   At risk for obstructive sleep apnea 07/01/2016   Suspected sleep apnea 07/01/2016   Arthritis of left knee 05/26/2016   Localized primary osteoarthritis of lower leg, left 05/26/2016   Clotting disorder ([place]) 05/22/2016   Chronic anticoagulation 01/06/2016   Right-sided epistaxis 01/06/2016   Urinary tract infection, site not specified 08/12/2015   Abnormal Pap smear of vagina 05/06/2015   Follow-up examination, following other surgery 12/14/2013   Pelvic and perineal pain 11/28/2013   Anxiety and depression 11/16/2013   H/O bariatric surgery 11/16/2013   Atrophic vaginitis 11/13/2013   Tear of lateral cartilage or meniscus of knee, current 04/14/2013   Allergic rhinitis 01/16/2013   Female genital symptoms 11/10/2012   Symptomatic menopausal or female climacteric states 11/10/2012   Disequilibrium 06/09/2012   Morbid obesity ([place]) 02/09/2012   Vitamin D deficiency 09/13/2008   Preop examination 04/20/2008   Hypothyroidism 05/20/2007    PCP: Wenda Low, MD  REFERRING PROVIDER: Erskine Emery, PA-C  REFERRING DIAG: s/p Rt TKA  THERAPY DIAG:  Acute pain of right knee  Stiffness of  right knee, not elsewhere classified  Localized edema  Unsteadiness on feet  Rationale for Evaluation and Treatment: Rehabilitation  ONSET DATE: 01/09/22  SUBJECTIVE:   SUBJECTIVE STATEMENT: Pt states that she is doing ok after her knee replacement. She had issues with her Lt knee when it was replaced and is worried that she will have the same issues with this knee. She is getting around her house ok. Pain is manageable with pain meds.  PERTINENT HISTORY: Rt THA 2022, Rt TKA 01/09/22, Lt TKA 2018 PAIN:  Are you having pain? Not at the moment. Took pain meds prior to visit PRECAUTIONS: Knee  WEIGHT BEARING RESTRICTIONS: No  FALLS:  Has patient fallen in last 6 months? No  LIVING ENVIRONMENT: Lives with: lives alone Lives in: House/apartment Stairs: Yes: Internal: 1 flight steps; handrails both sides Has following equipment at home: walker and cane  OCCUPATION:   PLOF: Independent  PATIENT GOALS: be able  to get upstairs to shower   NEXT MD VISIT: 02/12/22  OBJECTIVE:   DIAGNOSTIC FINDINGS:   PATIENT SURVEYS:  FOTO 39, goal 59  COGNITION: Overall cognitive status: Within functional limits for tasks assessed     SENSATION: Not tested  EDEMA:  Next visit, pt wearing compression sock  MUSCLE LENGTH: Hamstrings: Right  deg; Left  deg Marcello Moores test: Right  deg; Left  deg  POSTURE: weight shift left  PALPATION: Scar adhesions noted Rt surgical incision   LOWER EXTREMITY ROM:  Active ROM Right eval Left eval  Hip flexion    Hip extension    Hip abduction    Hip adduction    Hip internal rotation    Hip external rotation    Knee flexion supine 75 Full   Knee extension 18 Full   Ankle dorsiflexion    Ankle plantarflexion    Ankle inversion    Ankle eversion     (Blank rows = not tested)  LOWER EXTREMITY MMT:  MMT Right eval Left eval  Hip flexion 3 5   Hip extension    Hip abduction 3+   Hip adduction    Hip internal rotation    Hip  external rotation    Knee flexion    Knee extension    Ankle dorsiflexion    Ankle plantarflexion    Ankle inversion    Ankle eversion     (Blank rows = not tested)  LOWER EXTREMITY SPECIAL TESTS:    FUNCTIONAL TESTS:  5 times sit to stand: 13 sec, 2 HHA, weight shifted Lt Timed up and go (TUG): 16 sec, using RW  GAIT: Distance walked: through clinic to treatment room and gym Assistive device utilized: Environmental consultant - 2 wheeled Level of assistance: Modified independence Comments: Rt LE decreased knee flexion/hip extension during swing phase and minimal PF noted during pushoff   TODAY'S TREATMENT:                                                                                                                              DATE:   02/04/22 Discussed POC Reviewed home health HEP and made modifications to primarily address knee ROM restrictions - see HEP below   PATIENT EDUCATION:  Education details: eval findings/POC; importance of gaining knee extension for pain/swelling/mobility; HEP Person educated: Patient Education method: Explanation, Verbal cues, and Handouts Education comprehension: verbalized understanding and returned demonstration  HOME EXERCISE PROGRAM: Access Code: [facility] URL: [email or web address] Date: 02/04/2022 Prepared by: [facility]  Exercises - Seated Passive Knee Extension  - 3 x daily - 7 x weekly - up to 1 minute hold - Seated Knee Flexion AAROM  - 3 x daily - 7 x weekly - 30 seconds hold - Long Sitting Quad Set with Towel Roll Under Heel  - 1 x daily - 7 x weekly - 3 sets - 10 reps - Standing Marching  - 1-2 x  daily - 7 x weekly - 1 sets - 10 reps  ASSESSMENT:  CLINICAL IMPRESSION: Patient is a 62 y.o. F who was seen today for physical therapy evaluation and treatment Rt knee pain, stiffness and swelling s/p Rt TKA on 01/09/22. Pt had a course of HHPT and has been trying to keep up with her  HEP. She has limited knee ROM, lacking 18 deg extension and 75 deg flexion. PT was able to establish a HEP to specifically target ROM and pt was educated on the importance and benefits of gaining knee extension. She currently ambulates with RW and modifies functional activities to offload her Rt LE. Pt would benefit from skilled PT to address her limitations and promote independence and safety with ADLs and community activity.   OBJECTIVE IMPAIRMENTS: Abnormal gait, decreased activity tolerance, decreased balance, decreased endurance, decreased mobility, difficulty walking, decreased ROM, decreased strength, hypomobility, increased edema, increased fascial restrictions, impaired flexibility, postural dysfunction, and pain.   ACTIVITY LIMITATIONS: standing, squatting, sleeping, stairs, transfers, bed mobility, bathing, dressing, hygiene/grooming, and locomotion level  PARTICIPATION LIMITATIONS: cleaning, laundry, driving, shopping, community activity, and yard work  PERSONAL FACTORS: Age, Fitness, Past/current experiences, Time since onset of injury/illness/exacerbation, and 3+ comorbidities: Rt THA, Lt TKA, DM, anxiety  are also affecting patient's functional outcome.   REHAB POTENTIAL: Good  CLINICAL DECISION MAKING: Evolving/moderate complexity  EVALUATION COMPLEXITY: Moderate   GOALS: Goals reviewed with patient? Yes  SHORT TERM GOALS: Target date: 02/18/22 Pt will be independent with initial HEP to improve ROM and mobility. Baseline: Goal status: INITIAL  2.  Pt will have atleast 90 deg of Rt knee flexion Baseline:  Goal status: INITIAL  3.  Pt will have Rt knee extension to 0 deg.  Baseline:  Goal status: INITIAL    LONG TERM GOALS: Target date: 04/01/22  Pt will have 120 deg Rt knee flexion and 0 deg Rt knee extension Baseline: 75 deg flexion, 18 deg ext Goal status: INITIAL  2.  Pt will report atleast 50% improvement in Rt knee pain with ADLs. Baseline:  Goal status:  INITIAL  3.  Pt will be able to ambulate with LRAD atleast 182f with minimal compensations in her gait pattern.  Baseline:  Goal status: INITIAL  4.  Pt will be able to ascend and descend 1 flight of stairs with/without handrails and step over pattern to allow her to get upstairs to her shower. Baseline:  Goal status: INITIAL  5.  Pt will have 5/5 Rt hip/knee strength to improver her mechanics and efficiency with daily activity.  Baseline:  Goal status: INITIAL    PLAN:  PT FREQUENCY:  3x/week for 4 weeks, then decrease to 1-2x/week for 4 weeks   PT DURATION: 8 weeks  PLANNED INTERVENTIONS: Therapeutic exercises, Therapeutic activity, Neuromuscular re-education, Balance training, Gait training, Patient/Family education, Self Care, Joint mobilization, Cryotherapy, Manual therapy, and Re-evaluation  PLAN FOR NEXT SESSION: heavy focus on gaining knee extension, patella mobs, knee flexion ROM, hip strength progression   11:42 AM,02/04/22 SSherol DadePT, DPT [facility]

## 2022-02-04 ENCOUNTER — Encounter: Payer: Self-pay | Admitting: Hematology and Oncology

## 2022-02-04 ENCOUNTER — Other Ambulatory Visit: Payer: Self-pay | Admitting: *Deleted

## 2022-02-04 ENCOUNTER — Ambulatory Visit: Payer: 59 | Attending: Physician Assistant | Admitting: Physical Therapy

## 2022-02-04 ENCOUNTER — Other Ambulatory Visit: Payer: Self-pay

## 2022-02-04 ENCOUNTER — Encounter: Payer: Self-pay | Admitting: Physical Therapy

## 2022-02-04 DIAGNOSIS — M25561 Pain in right knee: Secondary | ICD-10-CM | POA: Diagnosis not present

## 2022-02-04 DIAGNOSIS — M25661 Stiffness of right knee, not elsewhere classified: Secondary | ICD-10-CM | POA: Diagnosis not present

## 2022-02-04 DIAGNOSIS — R600 Localized edema: Secondary | ICD-10-CM | POA: Insufficient documentation

## 2022-02-04 DIAGNOSIS — R2681 Unsteadiness on feet: Secondary | ICD-10-CM | POA: Diagnosis not present

## 2022-02-04 DIAGNOSIS — Z171 Estrogen receptor negative status [ER-]: Secondary | ICD-10-CM

## 2022-02-04 DIAGNOSIS — Z96651 Presence of right artificial knee joint: Secondary | ICD-10-CM | POA: Diagnosis not present

## 2022-02-04 DIAGNOSIS — D509 Iron deficiency anemia, unspecified: Secondary | ICD-10-CM

## 2022-02-06 ENCOUNTER — Other Ambulatory Visit: Payer: Self-pay

## 2022-02-06 ENCOUNTER — Inpatient Hospital Stay: Payer: 59 | Attending: Adult Health

## 2022-02-06 ENCOUNTER — Other Ambulatory Visit: Payer: Self-pay | Admitting: Orthopaedic Surgery

## 2022-02-06 ENCOUNTER — Encounter: Payer: Self-pay | Admitting: Orthopaedic Surgery

## 2022-02-06 DIAGNOSIS — D509 Iron deficiency anemia, unspecified: Secondary | ICD-10-CM | POA: Diagnosis not present

## 2022-02-06 DIAGNOSIS — Z171 Estrogen receptor negative status [ER-]: Secondary | ICD-10-CM | POA: Diagnosis not present

## 2022-02-06 DIAGNOSIS — C50411 Malignant neoplasm of upper-outer quadrant of right female breast: Secondary | ICD-10-CM | POA: Insufficient documentation

## 2022-02-06 DIAGNOSIS — Z79811 Long term (current) use of aromatase inhibitors: Secondary | ICD-10-CM | POA: Insufficient documentation

## 2022-02-06 DIAGNOSIS — Z9221 Personal history of antineoplastic chemotherapy: Secondary | ICD-10-CM | POA: Insufficient documentation

## 2022-02-06 DIAGNOSIS — Z923 Personal history of irradiation: Secondary | ICD-10-CM | POA: Insufficient documentation

## 2022-02-06 LAB — CMP (CANCER CENTER ONLY)
ALT: 7 U/L (ref 0–44)
AST: 14 U/L — ABNORMAL LOW (ref 15–41)
Albumin: 4.1 g/dL (ref 3.5–5.0)
Alkaline Phosphatase: 93 U/L (ref 38–126)
Anion gap: 7 (ref 5–15)
BUN: 10 mg/dL (ref 8–23)
CO2: 28 mmol/L (ref 22–32)
Calcium: 10.3 mg/dL (ref 8.9–10.3)
Chloride: 105 mmol/L (ref 98–111)
Creatinine: 0.62 mg/dL (ref 0.44–1.00)
GFR, Estimated: >60 mL/min (ref >60)
Glucose, Bld: 90 mg/dL (ref 70–99)
Potassium: 3.7 mmol/L (ref 3.5–5.1)
Sodium: 140 mmol/L (ref 135–145)
Total Bilirubin: 0.5 mg/dL (ref 0.3–1.2)
Total Protein: 7 g/dL (ref 6.5–8.1)

## 2022-02-06 LAB — CBC WITH DIFFERENTIAL (CANCER CENTER ONLY)
Abs Immature Granulocytes: 0.01 10*3/uL (ref 0.00–0.07)
Basophils Absolute: 0 10*3/uL (ref 0.0–0.1)
Basophils Relative: 0 %
Eosinophils Absolute: 0 10*3/uL (ref 0.0–0.5)
Eosinophils Relative: 1 %
HCT: 31.1 % — ABNORMAL LOW (ref 36.0–46.0)
Hemoglobin: 10 g/dL — ABNORMAL LOW (ref 12.0–15.0)
Immature Granulocytes: 0 %
Lymphocytes Relative: 24 %
Lymphs Abs: 1.4 10*3/uL (ref 0.7–4.0)
MCH: 27.1 pg (ref 26.0–34.0)
MCHC: 32.2 g/dL (ref 30.0–36.0)
MCV: 84.3 fL (ref 80.0–100.0)
Monocytes Absolute: 0.4 10*3/uL (ref 0.1–1.0)
Monocytes Relative: 7 %
Neutro Abs: 4.1 10*3/uL (ref 1.7–7.7)
Neutrophils Relative %: 68 %
Platelet Count: 428 10*3/uL — ABNORMAL HIGH (ref 150–400)
RBC: 3.69 MIL/uL — ABNORMAL LOW (ref 3.87–5.11)
RDW: 15.3 % (ref 11.5–15.5)
WBC Count: 5.9 10*3/uL (ref 4.0–10.5)
nRBC: 0 % (ref 0.0–0.2)

## 2022-02-06 LAB — IRON AND IRON BINDING CAPACITY (CC-WL,HP ONLY)
Iron: 61 ug/dL (ref 28–170)
Saturation Ratios: 20 % (ref 10.4–31.8)
TIBC: 311 ug/dL (ref 250–450)
UIBC: 250 ug/dL (ref 148–442)

## 2022-02-06 LAB — SAMPLE TO BLOOD BANK

## 2022-02-06 LAB — FERRITIN: Ferritin: 203 ng/mL (ref 11–307)

## 2022-02-06 MED ORDER — HYDROMORPHONE HCL 4 MG PO TABS: 4.0000 mg | ORAL_TABLET | Freq: Four times a day (QID) | ORAL | 0 refills | Status: DC | PRN

## 2022-02-09 ENCOUNTER — Ambulatory Visit: Payer: 59 | Admitting: Physical Therapy

## 2022-02-09 ENCOUNTER — Encounter: Payer: Self-pay | Admitting: Physical Therapy

## 2022-02-09 DIAGNOSIS — M25561 Pain in right knee: Secondary | ICD-10-CM

## 2022-02-09 DIAGNOSIS — R2681 Unsteadiness on feet: Secondary | ICD-10-CM

## 2022-02-09 DIAGNOSIS — R600 Localized edema: Secondary | ICD-10-CM

## 2022-02-09 DIAGNOSIS — Z483 Aftercare following surgery for neoplasm: Secondary | ICD-10-CM

## 2022-02-09 DIAGNOSIS — M6281 Muscle weakness (generalized): Secondary | ICD-10-CM

## 2022-02-09 DIAGNOSIS — M25661 Stiffness of right knee, not elsewhere classified: Secondary | ICD-10-CM

## 2022-02-09 NOTE — Therapy (Signed)
OUTPATIENT PHYSICAL THERAPY LOWER EXTREMITY EVALUATION   Patient Name: Sierra Perez MRN: [id number] DOB:04-01-1960, 62 y.o., female Today's Date: 02/09/2022  END OF SESSION:  PT End of Session - 02/09/22 1105     Visit Number 2    Date for PT Re-Evaluation 04/01/22    Authorization Type [employer]    Authorization Time Period 02/04/22 to 04/01/22    Authorization - Number of Visits 2    Progress Note Due on Visit 10    PT Start Time 1104    PT Stop Time 1200    PT Time Calculation (min) 56 min    Activity Tolerance No increased pain;Patient tolerated treatment well    Behavior During Therapy [facility] for tasks assessed/performed              Past Medical History:  Diagnosis Date   Anemia    Anxiety    Arthritis    Asthma    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, her-2 positive, ER/PR negative   Clotting disorder ([place])    testing was negative, h/o DVT while on treatment   Colon polyps    Complex regional pain syndrome i of right lower limb    RDS   Complication of anesthesia    Cystitis    Diabetes mellitus without complication ([place])    DVT (deep venous thrombosis) ([place])    started in 2015, multiple   Family history of breast cancer    Family history of colon cancer    Fibroid    Fibromyalgia    GERD (gastroesophageal reflux disease)    Heart murmur    HLD (hyperlipidemia)    HPV in female    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   Hypertension    Hypothyroidism    IBS (irritable bowel syndrome)    Mitral valve prolapse    Peptic ulcer    Personal history of malignant neoplasm of breast    Sleep apnea treated with continuous positive airway pressure (CPAP)    Past Surgical History:  Procedure Laterality Date   ABDOMINAL HYSTERECTOMY  2010   fibroids, h/o abnormal pap smears   AXILLARY SENTINEL NODE BIOPSY Right 07/08/2020   Procedure: RIGHT AXILLARY SENTINEL NODE BIOPSY;  Surgeon: Rolm Bookbinder, MD;  Location: [place];   Service: General;  Laterality: Right;   BREAST RECONSTRUCTION WITH PLACEMENT OF TISSUE EXPANDER AND FLEX HD (ACELLULAR HYDRATED DERMIS) Bilateral 07/08/2020   Procedure: BILATERAL BREAST RECONSTRUCTION WITH  FLEX HD (ACELLULAR HYDRATED DERMIS),DIRECT IMPLANT RECONSTRUCTION;  Surgeon: Cindra Presume, MD;  Location: [place];  Service: Plastics;  Laterality: Bilateral;   BREAST SURGERY Right 2015   right lumpectomy with reduction and lift   COLONOSCOPY     DENTAL SURGERY     ESOPHAGOGASTRODUODENOSCOPY     LAPAROSCOPIC GASTRIC SLEEVE RESECTION  2014   LAPAROSCOPIC OOPHERECTOMY Bilateral 2016   REPLACEMENT TOTAL KNEE Left 2018   also had 3 prior arthroscopies   TOE SURGERY Right    dislocated toe   TOTAL HIP ARTHROPLASTY Right 11/19/2020   Procedure: RIGHT TOTAL HIP ARTHROPLASTY ANTERIOR APPROACH;  Surgeon: Mcarthur Rossetti, MD;  Location: [place];  Service: Orthopedics;  Laterality: Right;   TOTAL KNEE ARTHROPLASTY Right 01/09/2022   Procedure: RIGHT TOTAL KNEE ARTHROPLASTY;  Surgeon: Mcarthur Rossetti, MD;  Location: [facility];  Service: Orthopedics;  Laterality: Right;   TOTAL MASTECTOMY Bilateral 07/08/2020   Procedure: BILATERAL TOTAL MASTECTOMY;  Surgeon: Rolm Bookbinder, MD;  Location: [facility];  Service: General;  Laterality: Bilateral;   VENA CAVA FILTER PLACEMENT  2017   Patient Active Problem List   Diagnosis Date Noted   Status post total right knee replacement 01/09/2022   Obesity (BMI 30-39.9) 06/23/2021   Iron deficiency anemia 01/23/2021   Status post right hip replacement 12/03/2020   Status post total replacement of right hip 11/19/2020   Breast cancer ([place]) 07/08/2020   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   H/O total hysterectomy 06/11/2020   Genetic testing 05/27/2020   Chronic superficial gastritis 05/27/2020   Fibromyalgia 05/27/2020   GERD (gastroesophageal reflux disease) 05/27/2020   Prediabetes 05/27/2020   Complex regional pain syndrome I, unspecified  05/27/2020   Atypical lobular hyperplasia [facility]) of left breast 05/23/2020   History of therapeutic radiation 05/23/2020   Family history of breast cancer    Family history of colon cancer    Personal history of malignant neoplasm of breast    Unilateral primary osteoarthritis, right knee 02/26/2020   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   DVT (deep venous thrombosis) ([place]) 05/04/2018   Malignant neoplasm of upper-outer quadrant of right breast in female, estrogen receptor negative ([place]) 05/03/2018   Obstructive sleep apnea (adult) (pediatric) 09/14/2017   Adenopathy 07/15/2017   B12 deficiency 07/15/2017   Disorder of tympanic membrane of right ear 07/15/2017   Recurrent UTI 03/09/2017   Bladder pain 02/25/2017   Proteinuria 02/25/2017   Hematoma of thigh, left, subsequent encounter 08/16/2016   At risk for obstructive sleep apnea 07/01/2016   Suspected sleep apnea 07/01/2016   Arthritis of left knee 05/26/2016   Localized primary osteoarthritis of lower leg, left 05/26/2016   Clotting disorder ([place]) 05/22/2016   Chronic anticoagulation 01/06/2016   Right-sided epistaxis 01/06/2016   Urinary tract infection, site not specified 08/12/2015   Abnormal Pap smear of vagina 05/06/2015   Follow-up examination, following other surgery 12/14/2013   Pelvic and perineal pain 11/28/2013   Anxiety and depression 11/16/2013   H/O bariatric surgery 11/16/2013   Atrophic vaginitis 11/13/2013   Tear of lateral cartilage or meniscus of knee, current 04/14/2013   Allergic rhinitis 01/16/2013   Female genital symptoms 11/10/2012   Symptomatic menopausal or female climacteric states 11/10/2012   Disequilibrium 06/09/2012   Morbid obesity ([place]) 02/09/2012   Vitamin D deficiency 09/13/2008   Preop examination 04/20/2008   Hypothyroidism 05/20/2007    PCP: Wenda Low, MD  REFERRING PROVIDER: Erskine Emery, PA-C  REFERRING DIAG: s/p Rt TKA  THERAPY DIAG:  Acute pain of right knee  Stiffness of  right knee, not elsewhere classified  Localized edema  Unsteadiness on feet  Aftercare following surgery for neoplasm  Muscle weakness (generalized)  Rationale for Evaluation and Treatment: Rehabilitation  ONSET DATE: 01/09/22  SUBJECTIVE:   SUBJECTIVE STATEMENT: Pain this AM 6/10: Medial knee mostly   PERTINENT HISTORY: Rt THA 2022, Rt TKA 01/09/22, Lt TKA 2018 PAIN:  Are you having pain? Not at the moment. Took pain meds prior to visit PRECAUTIONS: Knee  WEIGHT BEARING RESTRICTIONS: No  FALLS:  Has patient fallen in last 6 months? No  LIVING ENVIRONMENT: Lives with: lives alone Lives in: House/apartment Stairs: Yes: Internal: 1 flight steps; handrails both sides Has following equipment at home: walker and cane  OCCUPATION:   PLOF: Independent  PATIENT GOALS: be able to get upstairs to shower   NEXT MD VISIT: 02/12/22  OBJECTIVE:   DIAGNOSTIC FINDINGS:   PATIENT SURVEYS:  FOTO 39, goal 59  COGNITION: Overall cognitive  status: Within functional limits for tasks assessed     SENSATION: Not tested  EDEMA:  Next visit, pt wearing compression sock  MUSCLE LENGTH: Hamstrings: Right  deg; Left  deg Marcello Moores test: Right  deg; Left  deg  POSTURE: weight shift left  PALPATION: Scar adhesions noted Rt surgical incision   LOWER EXTREMITY ROM:  Active ROM Right eval Left eval Right 02/09/21  Hip flexion     Hip extension     Hip abduction     Hip adduction     Hip internal rotation     Hip external rotation     Knee flexion supine 75 Full  91 supine  Knee extension 18 Full    Ankle dorsiflexion     Ankle plantarflexion     Ankle inversion     Ankle eversion      (Blank rows = not tested)  LOWER EXTREMITY MMT:  MMT Right eval Left eval  Hip flexion 3 5   Hip extension    Hip abduction 3+   Hip adduction    Hip internal rotation    Hip external rotation    Knee flexion    Knee extension    Ankle dorsiflexion    Ankle plantarflexion     Ankle inversion    Ankle eversion     (Blank rows = not tested)  LOWER EXTREMITY SPECIAL TESTS:    FUNCTIONAL TESTS:  5 times sit to stand: 13 sec, 2 HHA, weight shifted Lt Timed up and go (TUG): 16 sec, using RW  GAIT: Distance walked: through clinic to treatment room and gym Assistive device utilized: Environmental consultant - 2 wheeled Level of assistance: Modified independence Comments: Rt LE decreased knee flexion/hip extension during swing phase and minimal PF noted during pushoff   TODAY'S TREATMENT:                                                                                                                              DATE:   02/09/21: Manual: Pt sitting off the hi lo table position in some knee flexion: Scar mobs, patella mobs, passive knee flexion stretching, LAQ 20x,  Nustep L1 x 5 min: seat 42 old model [employer] for swelling x 15 min medium compression 3 flakes Measure knee flexion: 91 degrees F  02/04/22 Discussed POC Reviewed home health HEP and made modifications to primarily address knee ROM restrictions - see HEP below   PATIENT EDUCATION:  Education details: eval findings/POC; importance of gaining knee extension for pain/swelling/mobility; HEP Person educated: Patient Education method: Explanation, Verbal cues, and Handouts Education comprehension: verbalized understanding and returned demonstration  HOME EXERCISE PROGRAM: Access Code: [facility] URL: [email or web address] Date: 02/04/2022 Prepared by: [facility]  Exercises - Seated Passive Knee Extension  - 3 x daily - 7 x weekly - up to 1 minute hold - Seated Knee Flexion AAROM  - 3 x daily - 7 x  weekly - 30 seconds hold - Long Sitting Quad Set with Towel Roll Under Heel  - 1 x daily - 7 x weekly - 3 sets - 10 reps - Standing Marching  - 1-2 x daily - 7 x weekly - 1 sets - 10 reps Myrene Galas, PTA 02/09/22 11:47 AM added scar massage education and  LAQ  ASSESSMENT:  CLINICAL IMPRESSION: Pt arrives with moderate knee pain, swelling and using her rolling walker. Pt is using her cane at home but when her dog is up and around the walker can be a deterrent to him jumping on her. Pt achieved 91 degrees F in flexion in supine. Pt was educated in scar massage and will do some LAQ to get her quads contracting. Mild discomfort with knee stretching.   OBJECTIVE IMPAIRMENTS: Abnormal gait, decreased activity tolerance, decreased balance, decreased endurance, decreased mobility, difficulty walking, decreased ROM, decreased strength, hypomobility, increased edema, increased fascial restrictions, impaired flexibility, postural dysfunction, and pain.   ACTIVITY LIMITATIONS: standing, squatting, sleeping, stairs, transfers, bed mobility, bathing, dressing, hygiene/grooming, and locomotion level  PARTICIPATION LIMITATIONS: cleaning, laundry, driving, shopping, community activity, and yard work  PERSONAL FACTORS: Age, Fitness, Past/current experiences, Time since onset of injury/illness/exacerbation, and 3+ comorbidities: Rt THA, Lt TKA, DM, anxiety  are also affecting patient's functional outcome.   REHAB POTENTIAL: Good  CLINICAL DECISION MAKING: Evolving/moderate complexity  EVALUATION COMPLEXITY: Moderate   GOALS: Goals reviewed with patient? Yes  SHORT TERM GOALS: Target date: 02/18/22 Pt will be independent with initial HEP to improve ROM and mobility. Baseline: Goal status: Goal met 02/09/21  2.  Pt will have atleast 90 deg of Rt knee flexion Baseline:  Goal status:Goal met 02/09/21  3.  Pt will have Rt knee extension to 0 deg.  Baseline:  Goal status: INITIAL    LONG TERM GOALS: Target date: 04/01/22  Pt will have 120 deg Rt knee flexion and 0 deg Rt knee extension Baseline: 75 deg flexion, 18 deg ext Goal status: INITIAL  2.  Pt will report atleast 50% improvement in Rt knee pain with ADLs. Baseline:  Goal status: INITIAL  3.   Pt will be able to ambulate with LRAD atleast 152f with minimal compensations in her gait pattern.  Baseline:  Goal status: INITIAL  4.  Pt will be able to ascend and descend 1 flight of stairs with/without handrails and step over pattern to allow her to get upstairs to her shower. Baseline:  Goal status: INITIAL  5.  Pt will have 5/5 Rt hip/knee strength to improver her mechanics and efficiency with daily activity.  Baseline:  Goal status: INITIAL    PLAN:  PT FREQUENCY:  3x/week for 4 weeks, then decrease to 1-2x/week for 4 weeks   PT DURATION: 8 weeks  PLANNED INTERVENTIONS: Therapeutic exercises, Therapeutic activity, Neuromuscular re-education, Balance training, Gait training, Patient/Family education, Self Care, Joint mobilization, Cryotherapy, Manual therapy, and Re-evaluation  PLAN FOR NEXT SESSION: Continue to work on knee flexion, swelling, gait with cane, Nustep, quad strength  JMyrene Galas PTA 02/09/22 11:47 AM  [phone]

## 2022-02-10 ENCOUNTER — Inpatient Hospital Stay (HOSPITAL_BASED_OUTPATIENT_CLINIC_OR_DEPARTMENT_OTHER): Payer: 59 | Admitting: Hematology and Oncology

## 2022-02-10 DIAGNOSIS — D509 Iron deficiency anemia, unspecified: Secondary | ICD-10-CM

## 2022-02-10 DIAGNOSIS — C50411 Malignant neoplasm of upper-outer quadrant of right female breast: Secondary | ICD-10-CM | POA: Diagnosis not present

## 2022-02-10 DIAGNOSIS — Z171 Estrogen receptor negative status [ER-]: Secondary | ICD-10-CM | POA: Diagnosis not present

## 2022-02-10 NOTE — Assessment & Plan Note (Signed)
08/27/2012: Right breast: Stage Ia IDC with DCIS T2 N0 M0 ER negative PR negative, HER-2 positive, Ki-67 20 to 25% status post neoadjuvant TCHP: Complete pathologic response, adjuvant radiation   05/10/2020: Recurrence/relapse: Right breast: 2.1 cm non-mass enhancement: Biopsy grade 2 invasive lobular cancer ER/PR positive HER-2 equivocal by IHC FISH pending, Ki-67 1% Left breast: 10.5 cm non-mass enhancement anterior biopsy fibrocystic change, posterior biopsy ALH/LCIS   Treatment plan: 1.   07/08/2020 Bilateral mastectomies with reconstruction: Right mastectomy: Grade 2 ILC 0.9 cm, ALH, margins negative, 0/1 lymph, left mastectomy: Benign 2. adjuvant antiestrogen therapy with anastrozole 1 mg daily x7 years started July 2022 Genetic testing ------------------------------------------------------------ Current treatment: Adjuvant antiestrogen therapy with anastrozole 1 mg daily x7 years started July 2022 Anastrozole Toxicities: Denies any major side effects to anastrozole therapy.   We might consider doing a breast MRI every 3 years for implant integrity. Breast cancer surveillance: Breast examination: 01/23/2021: Benign  Follow-up in November 2024

## 2022-02-10 NOTE — Progress Notes (Signed)
HEMATOLOGY-ONCOLOGY TELEPHONE VISIT PROGRESS NOTE  I connected with our patient on 02/10/22 at  8:30 AM EST by telephone and verified that I am speaking with the correct person using two identifiers.  I discussed the limitations, risks, security and privacy concerns of performing an evaluation and management service by telephone and the availability of in person appointments.  I also discussed with the patient that there may be a patient responsible charge related to this service. The patient expressed understanding and agreed to proceed.   History of Present Illness: Recent knee replacement surgery.  To follow-up to discuss results of her blood work.  She is currently back on blood thinners.  She tells me that her recovery has been fairly slow because of the holidays and not having had a chance to do physical therapy.  She is still very sore in the knee.  Oncology History  Malignant neoplasm of upper-outer quadrant of right breast in female, estrogen receptor negative ([place])  08/27/2012 Initial Diagnosis   Stage 1a IDC with DCIS (T2N0M0) : ER-, PR-, HER positive 3+, Ki67 20-25%   08/27/2012 Cancer Staging   Staging form: Breast, AJCC 8th Edition - Clinical stage from 08/27/2012: Stage IIA (cT2, cN0, cM0, G3, ER-, PR-, HER2+) - Signed by Nicholas Lose, MD on 11/17/2019   09/06/2012 Breast MRI   Right breast, lateral to the nipple, 10x14x56m mass with mildly irregular borders   09/10/2012 Echocardiogram   EF 55-60%    Chemotherapy   Neoadjuvant Taxotere/Carboplatin/Herceptin/Perjeta followed by adjuvant Herceptin maintenance   01/09/2013 Breast MRI   Complete pathological response to neoadjuvant treatment: right breast mass previously noted at 9:00 position no longer identified.   02/13/2013 Surgery   Right breast lumpectomy: benign parenchyma with some stromal fibrosis consistent with previous tumor site, focal area of atypical lobular hyperplasia, all lymph nodes negative.     Radiation  Therapy   Adjuvant radiation   04/06/2013 Imaging   DEXA: T-score of -1.1, osteopenia    09/25/2016 Initial Biopsy   Columnar cell alteration with atypia, foreign body granuloma   05/10/2020 Relapse/Recurrence   Breast MRI detected new linear non-mass enhancement right breast 2.1 cm, non-mass enhancement centrally left breast spanning 10.5 cm, left breast nipple areolar complex enhancement: Biopsy right breast: Grade 2 invasive lobular cancer.  ER/PR positive HER-2 equivocal by IHC FISH pending, Ki-67 1% left breast biopsy anterior fibrocystic change, posterior atypical lobular hyperplasia/LCIS   05/26/2020 Genetic Testing   Negative genetic testing on the CancerNext-Expanded+RNAinsight panel.  The CancerNext-Expanded gene panel [facility] by [facility] includes sequencing and rearrangement analysis for the following 77 genes: AIP, ALK, APC*, ATM*, AXIN2, BAP1, BARD1, BLM, BMPR1A, BRCA1*, BRCA2*, BRIP1*, CDC73, CDH1*, CDK4, CDKN1B, CDKN2A, CHEK2*, CTNNA1, DICER1, FANCC, FH, FLCN, GALNT12, KIF1B, LZTR1, MAX, MEN1, MET, MLH1*, MSH2*, MSH3, MSH6*, MUTYH*, NBN, NF1*, NF2, NTHL1, PALB2*, PHOX2B, PMS2*, POT1, PRKAR1A, PTCH1, PTEN*, RAD51C*, RAD51D*, RB1, RECQL, RET, SDHA, SDHAF2, SDHB, SDHC, SDHD, SMAD4, SMARCA4, SMARCB1, SMARCE1, STK11, SUFU, TMEM127, TP53*, TSC1, TSC2, VHL and XRCC2 (sequencing and deletion/duplication); EGFR, EGLN1, HOXB13, KIT, MITF, PDGFRA, POLD1, and POLE (sequencing only); EPCAM and GREM1 (deletion/duplication only). DNA and RNA analyses performed for * genes. The report date is May 26, 2020.    07/08/2020 Surgery   Bilateral mastectomies with reconstruction: Right mastectomy: Grade 2 ILC 0.9 cm, ALH, margins negative, 0/1 lymph, left mastectomy: Benign    08/2020 -  Anti-estrogen oral therapy   adjuvant antiestrogen therapy with anastrozole 1 mg daily x7 years started July 2022  REVIEW OF SYSTEMS:   Constitutional: Denies fevers, chills or abnormal weight loss All  other systems were reviewed with the patient and are negative. Observations/Objective:     Assessment Plan:  Malignant neoplasm of upper-outer quadrant of right breast in female, estrogen receptor negative ([place]) 08/27/2012: Right breast: Stage Ia IDC with DCIS T2 N0 M0 ER negative PR negative, HER-2 positive, Ki-67 20 to 25% status post neoadjuvant TCHP: Complete pathologic response, adjuvant radiation   05/10/2020: Recurrence/relapse: Right breast: 2.1 cm non-mass enhancement: Biopsy grade 2 invasive lobular cancer ER/PR positive HER-2 equivocal by IHC FISH pending, Ki-67 1% Left breast: 10.5 cm non-mass enhancement anterior biopsy fibrocystic change, posterior biopsy ALH/LCIS   Treatment plan: 1.   07/08/2020 Bilateral mastectomies with reconstruction: Right mastectomy: Grade 2 ILC 0.9 cm, ALH, margins negative, 0/1 lymph, left mastectomy: Benign 2. adjuvant antiestrogen therapy with anastrozole 1 mg daily x7 years started July 2022 Genetic testing ------------------------------------------------------------ Current treatment: Adjuvant antiestrogen therapy with anastrozole 1 mg daily x7 years started July 2022 Anastrozole Toxicities: Denies any major side effects to anastrozole therapy.   We might consider doing a breast MRI every 3 years for implant integrity. Breast cancer surveillance: Breast examination: 01/23/2021: Benign  Follow-up in November 2024  Iron deficiency anemia Secondary to right hip replacement surgery November 19, 2020.  Her hemoglobin went from 10.4 down to 7.7.  Received IV iron 01/25/2021   Knee replacement surgery: 01/09/2022  Lab review: 02/06/2022: Hemoglobin 10, MCV 84.3, platelets 428, iron 61, TIBC 311, iron saturation 20%, ferritin 203  I discussed with patient that there is no role of iron infusions at this time.  The anemia should slowly improve over time.  We will recheck labs when she comes back in November.   I discussed the assessment and treatment  plan with the patient. The patient was provided an opportunity to ask questions and all were answered. The patient agreed with the plan and demonstrated an understanding of the instructions. The patient was advised to call back or seek an in-person evaluation if the symptoms worsen or if the condition fails to improve as anticipated.   I provided 12 minutes of non-face-to-face time during this encounter.  This includes time for charting and coordination of care   Harriette Ohara, MD

## 2022-02-10 NOTE — Assessment & Plan Note (Signed)
Secondary to right hip replacement surgery November 19, 2020.  Her hemoglobin went from 10.4 down to 7.7.  Received IV iron 01/25/2021   Knee replacement surgery: 01/09/2022  Lab review: 02/06/2022: Hemoglobin 10, MCV 84.3, platelets 428, iron 61, TIBC 311, iron saturation 20%, ferritin 203  I discussed with patient that there is no role of iron infusions at this time.  The anemia should slowly improve over time.  We will recheck labs when she comes back in November.

## 2022-02-11 ENCOUNTER — Ambulatory Visit: Payer: 59 | Attending: Hematology and Oncology | Admitting: Rehabilitative and Restorative Service Providers"

## 2022-02-11 ENCOUNTER — Telehealth: Payer: Self-pay | Admitting: Hematology and Oncology

## 2022-02-11 ENCOUNTER — Encounter: Payer: Self-pay | Admitting: Rehabilitative and Restorative Service Providers"

## 2022-02-11 DIAGNOSIS — M25561 Pain in right knee: Secondary | ICD-10-CM | POA: Insufficient documentation

## 2022-02-11 DIAGNOSIS — Z483 Aftercare following surgery for neoplasm: Secondary | ICD-10-CM | POA: Insufficient documentation

## 2022-02-11 DIAGNOSIS — M25661 Stiffness of right knee, not elsewhere classified: Secondary | ICD-10-CM | POA: Insufficient documentation

## 2022-02-11 DIAGNOSIS — R600 Localized edema: Secondary | ICD-10-CM | POA: Diagnosis present

## 2022-02-11 DIAGNOSIS — R2681 Unsteadiness on feet: Secondary | ICD-10-CM | POA: Diagnosis present

## 2022-02-11 NOTE — Therapy (Signed)
OUTPATIENT PHYSICAL THERAPY TREATMENT NOTE   Patient Name: Sierra Perez MRN: [id number] DOB:1961-01-18, 62 y.o., female Today's Date: 02/11/2022  END OF SESSION:  PT End of Session - 02/11/22 1107     Visit Number 3    Date for PT Re-Evaluation 04/01/22    Authorization Type [employer]    Authorization Time Period 02/04/22 to 04/01/22    PT Start Time 1102    PT Stop Time 1154    PT Time Calculation (min) 52 min    Activity Tolerance Patient tolerated treatment well    Behavior During Therapy WFL for tasks assessed/performed              Past Medical History:  Diagnosis Date   Anemia    Anxiety    Arthritis    Asthma    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, her-2 positive, ER/PR negative   Clotting disorder ([place])    testing was negative, h/o DVT while on treatment   Colon polyps    Complex regional pain syndrome i of right lower limb    RDS   Complication of anesthesia    Cystitis    Diabetes mellitus without complication ([place])    DVT (deep venous thrombosis) ([place])    started in 2015, multiple   Family history of breast cancer    Family history of colon cancer    Fibroid    Fibromyalgia    GERD (gastroesophageal reflux disease)    Heart murmur    HLD (hyperlipidemia)    HPV in female    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   Hypertension    Hypothyroidism    IBS (irritable bowel syndrome)    Mitral valve prolapse    Peptic ulcer    Personal history of malignant neoplasm of breast    Sleep apnea treated with continuous positive airway pressure (CPAP)    Past Surgical History:  Procedure Laterality Date   ABDOMINAL HYSTERECTOMY  2010   fibroids, h/o abnormal pap smears   AXILLARY SENTINEL NODE BIOPSY Right 07/08/2020   Procedure: RIGHT AXILLARY SENTINEL NODE BIOPSY;  Surgeon: Rolm Bookbinder, MD;  Location: [facility];  Service: General;  Laterality: Right;   BREAST RECONSTRUCTION WITH PLACEMENT OF TISSUE EXPANDER AND FLEX  HD (ACELLULAR HYDRATED DERMIS) Bilateral 07/08/2020   Procedure: BILATERAL BREAST RECONSTRUCTION WITH  FLEX HD (ACELLULAR HYDRATED DERMIS),DIRECT IMPLANT RECONSTRUCTION;  Surgeon: Cindra Presume, MD;  Location: [facility];  Service: Plastics;  Laterality: Bilateral;   BREAST SURGERY Right 2015   right lumpectomy with reduction and lift   COLONOSCOPY     DENTAL SURGERY     ESOPHAGOGASTRODUODENOSCOPY     LAPAROSCOPIC GASTRIC SLEEVE RESECTION  2014   LAPAROSCOPIC OOPHERECTOMY Bilateral 2016   REPLACEMENT TOTAL KNEE Left 2018   also had 3 prior arthroscopies   TOE SURGERY Right    dislocated toe   TOTAL HIP ARTHROPLASTY Right 11/19/2020   Procedure: RIGHT TOTAL HIP ARTHROPLASTY ANTERIOR APPROACH;  Surgeon: Mcarthur Rossetti, MD;  Location: [facility];  Service: Orthopedics;  Laterality: Right;   TOTAL KNEE ARTHROPLASTY Right 01/09/2022   Procedure: RIGHT TOTAL KNEE ARTHROPLASTY;  Surgeon: Mcarthur Rossetti, MD;  Location: [facility];  Service: Orthopedics;  Laterality: Right;   TOTAL MASTECTOMY Bilateral 07/08/2020   Procedure: BILATERAL TOTAL MASTECTOMY;  Surgeon: Rolm Bookbinder, MD;  Location: [facility];  Service: General;  Laterality: Bilateral;   VENA CAVA FILTER PLACEMENT  2017   Patient  Active Problem List   Diagnosis Date Noted   Status post total right knee replacement 01/09/2022   Obesity (BMI 30-39.9) 06/23/2021   Iron deficiency anemia 01/23/2021   Status post right hip replacement 12/03/2020   Status post total replacement of right hip 11/19/2020   Breast cancer ([place]) 07/08/2020   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   H/O total hysterectomy 06/11/2020   Genetic testing 05/27/2020   Chronic superficial gastritis 05/27/2020   Fibromyalgia 05/27/2020   GERD (gastroesophageal reflux disease) 05/27/2020   Prediabetes 05/27/2020   Complex regional pain syndrome I, unspecified 05/27/2020   Atypical lobular hyperplasia [facility]) of left breast 05/23/2020   History of therapeutic  radiation 05/23/2020   Family history of breast cancer    Family history of colon cancer    Personal history of malignant neoplasm of breast    Unilateral primary osteoarthritis, right knee 02/26/2020   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   DVT (deep venous thrombosis) ([place]) 05/04/2018   Malignant neoplasm of upper-outer quadrant of right breast in female, estrogen receptor negative ([place]) 05/03/2018   Obstructive sleep apnea (adult) (pediatric) 09/14/2017   Adenopathy 07/15/2017   B12 deficiency 07/15/2017   Disorder of tympanic membrane of right ear 07/15/2017   Recurrent UTI 03/09/2017   Bladder pain 02/25/2017   Proteinuria 02/25/2017   Hematoma of thigh, left, subsequent encounter 08/16/2016   At risk for obstructive sleep apnea 07/01/2016   Suspected sleep apnea 07/01/2016   Arthritis of left knee 05/26/2016   Localized primary osteoarthritis of lower leg, left 05/26/2016   Clotting disorder ([place]) 05/22/2016   Chronic anticoagulation 01/06/2016   Right-sided epistaxis 01/06/2016   Urinary tract infection, site not specified 08/12/2015   Abnormal Pap smear of vagina 05/06/2015   Follow-up examination, following other surgery 12/14/2013   Pelvic and perineal pain 11/28/2013   Anxiety and depression 11/16/2013   H/O bariatric surgery 11/16/2013   Atrophic vaginitis 11/13/2013   Tear of lateral cartilage or meniscus of knee, current 04/14/2013   Allergic rhinitis 01/16/2013   Female genital symptoms 11/10/2012   Symptomatic menopausal or female climacteric states 11/10/2012   Disequilibrium 06/09/2012   Morbid obesity ([place]) 02/09/2012   Vitamin D deficiency 09/13/2008   Preop examination 04/20/2008   Hypothyroidism 05/20/2007    PCP: Wenda Low, MD  REFERRING PROVIDER: Erskine Emery, PA-C  REFERRING DIAG: s/p Rt TKA  THERAPY DIAG:  Acute pain of right knee  Stiffness of right knee, not elsewhere classified  Localized edema  Unsteadiness on feet  Rationale for  Evaluation and Treatment: Rehabilitation  ONSET DATE: 01/09/22  SUBJECTIVE:   SUBJECTIVE STATEMENT: Pt reports soreness after last PT visit, states that she took her medication this morning.  Pt reports using cane off and one for approx a week.  PERTINENT HISTORY: Rt THA 2022, Rt TKA 01/09/22, Lt TKA 2018, Hx of Breast Cancer s/p double mastectomy with sentinel lymph node removed from right side  PAIN:  Are you having pain? Yes Reports 7/10 pain in right knee  PRECAUTIONS: Knee, s/p bilateral mastectomy  WEIGHT BEARING RESTRICTIONS: No  FALLS:  Has patient fallen in last 6 months? No  LIVING ENVIRONMENT: Lives with: lives alone Lives in: House/apartment Stairs: Yes: Internal: 1 flight steps; handrails both sides Has following equipment at home: walker and cane  OCCUPATION:   PLOF: Independent  PATIENT GOALS: be able to get upstairs to shower   NEXT MD VISIT: 02/12/22  OBJECTIVE:   DIAGNOSTIC FINDINGS:   PATIENT SURVEYS:  Eval:  FOTO 39, goal 23  COGNITION: Overall cognitive status: Within functional limits for tasks assessed     SENSATION: Not tested   POSTURE: weight shift left  PALPATION: Scar adhesions noted Rt surgical incision   LOWER EXTREMITY ROM: Eval: Right knee flexion 18-75 Left knee is [facility]  02/09/2022: Right knee flexion 91 degrees in supine  02/11/2022: Supine A/ROM Right knee:  0 - 93 deg Seated A/ROM Right knee:  5 - 82 deg Seated left knee A/ROM 0-123 deg  LOWER EXTREMITY MMT:  MMT Right eval Left eval  Hip flexion 3 5   Hip extension    Hip abduction 3+   Hip adduction    Hip internal rotation    Hip external rotation    Knee flexion    Knee extension    Ankle dorsiflexion    Ankle plantarflexion    Ankle inversion    Ankle eversion     (Blank rows = not tested)  LOWER EXTREMITY SPECIAL TESTS:    FUNCTIONAL TESTS:  Eval: 5 times sit to stand: 13 sec, 2 HHA, weight shifted Lt Timed up and go (TUG): 16 sec, using  RW  GAIT: Distance walked: through clinic to treatment room and gym Assistive device utilized: Environmental consultant - 2 wheeled Level of assistance: Modified independence Comments: Rt LE decreased knee flexion/hip extension during swing phase and minimal PF noted during pushoff   TODAY'S TREATMENT:                                                                                                                               DATE: 02/11/2022 Nustep level 2 (seat at 10 on green machine) x6 minutes with PT present to discuss status Seated:  heel/toe raises, marching, LAQ.  RLE 2x10 Supine:  quad sets, straight leg raise, SAQ.  RLE 2x10 each Supine heel flexion with strap 2x10  Supine A/ROM:  0 - 93 deg Seated A/ROM:  5 - 82 deg Game Ready to right knee:  3 snowflakes, medium compression, x10 min  02/09/21: Manual: Pt sitting off the hi lo table position in some knee flexion: Scar mobs, patella mobs, passive knee flexion stretching, LAQ 20x,  Nustep L1 x 5 min: seat 26 old model Game Ready for swelling x 15 min medium compression 3 flakes Measure knee flexion: 91 degrees F  02/04/22 Discussed POC Reviewed home health HEP and made modifications to primarily address knee ROM restrictions - see HEP below   PATIENT EDUCATION:  Education details: eval findings/POC; importance of gaining knee extension for pain/swelling/mobility; HEP Person educated: Patient Education method: Explanation, Verbal cues, and Handouts Education comprehension: verbalized understanding and returned demonstration  HOME EXERCISE PROGRAM: Access Code: [facility] URL: [email or web address] Date: 02/04/2022 Prepared by: [facility]  Exercises - Seated Passive Knee Extension  - 3 x daily - 7 x weekly - up to 1 minute hold - Seated Knee Flexion AAROM  - 3  x daily - 7 x weekly - 30 seconds hold - Long Sitting Quad Set with Towel Roll Under Heel  - 1 x daily - 7 x weekly - 3  sets - 10 reps - Standing Marching  - 1-2 x daily - 7 x weekly - 1 sets - 10 reps Myrene Galas, PTA 02/11/22 12:10 PM added scar massage education and LAQ  ASSESSMENT:  CLINICAL IMPRESSION:  Ms Nelms presents to skilled PT reporting compliance with HEP, but has been trying to use the cane more.  Patient with left leaning gait pattern with use of SPC and recommended that she may want to use the walker for longer distances to avoid developing imbalanced gait pattern.  Patient verbalizes understanding.  Patient with noted improved supine A/ROM for right knee when compared to left knee.  Patient continues to require skilled PT to progress towards goal related activities.   OBJECTIVE IMPAIRMENTS: Abnormal gait, decreased activity tolerance, decreased balance, decreased endurance, decreased mobility, difficulty walking, decreased ROM, decreased strength, hypomobility, increased edema, increased fascial restrictions, impaired flexibility, postural dysfunction, and pain.   ACTIVITY LIMITATIONS: standing, squatting, sleeping, stairs, transfers, bed mobility, bathing, dressing, hygiene/grooming, and locomotion level  PARTICIPATION LIMITATIONS: cleaning, laundry, driving, shopping, community activity, and yard work  PERSONAL FACTORS: Age, Fitness, Past/current experiences, Time since onset of injury/illness/exacerbation, and 3+ comorbidities: Rt THA, Lt TKA, DM, anxiety  are also affecting patient's functional outcome.   REHAB POTENTIAL: Good  CLINICAL DECISION MAKING: Evolving/moderate complexity  EVALUATION COMPLEXITY: Moderate   GOALS: Goals reviewed with patient? Yes  SHORT TERM GOALS: Target date: 02/18/22 Pt will be independent with initial HEP to improve ROM and mobility. Baseline: Goal status: Goal met 02/09/21  2.  Pt will have atleast 90 deg of Rt knee flexion Baseline:  Goal status:Goal met 02/09/21  3.  Pt will have Rt knee extension to 0 deg.  Baseline:  Goal status: IN  PROGRESS    LONG TERM GOALS: Target date: 04/01/22  Pt will have 120 deg Rt knee flexion and 0 deg Rt knee extension Baseline: 75 deg flexion, 18 deg ext Goal status: INITIAL  2.  Pt will report atleast 50% improvement in Rt knee pain with ADLs. Baseline:  Goal status: INITIAL  3.  Pt will be able to ambulate with LRAD atleast 14f with minimal compensations in her gait pattern.  Baseline:  Goal status: INITIAL  4.  Pt will be able to ascend and descend 1 flight of stairs with/without handrails and step over pattern to allow her to get upstairs to her shower. Baseline:  Goal status: INITIAL  5.  Pt will have 5/5 Rt hip/knee strength to improver her mechanics and efficiency with daily activity.  Baseline:  Goal status: INITIAL    PLAN:  PT FREQUENCY:  3x/week for 4 weeks, then decrease to 1-2x/week for 4 weeks   PT DURATION: 8 weeks  PLANNED INTERVENTIONS: Therapeutic exercises, Therapeutic activity, Neuromuscular re-education, Balance training, Gait training, Patient/Family education, Self Care, Joint mobilization, Cryotherapy, Manual therapy, and Re-evaluation  PLAN FOR NEXT SESSION: Continue to work on knee flexion, swelling, normalized gait with cane, Nustep, quad strength   [place] PT 02/11/22 12:10 PM  [facility] [address] [place] [place] [address]Phone # [phone] [phone]

## 2022-02-11 NOTE — Telephone Encounter (Signed)
Scheduled appointment per 1/9 los. Ledt voicemail.

## 2022-02-12 ENCOUNTER — Ambulatory Visit: Payer: Commercial Managed Care - PPO | Admitting: Physician Assistant

## 2022-02-12 ENCOUNTER — Encounter: Payer: Self-pay | Admitting: Physician Assistant

## 2022-02-12 DIAGNOSIS — Z96651 Presence of right artificial knee joint: Secondary | ICD-10-CM

## 2022-02-12 MED ORDER — GABAPENTIN 600 MG PO TABS: 600.0000 mg | ORAL_TABLET | Freq: Three times a day (TID) | ORAL | 0 refills | Status: DC

## 2022-02-12 NOTE — Progress Notes (Signed)
HPI: Mrs. Monforte comes in today status post right total knee arthroplasty 01/09/2022.  She states she has had increased pain after going to outpatient physical therapy.  She notes her pain is 8 out of 10 at worst.  She is on chronic Eliquis due to a clotting disorder.  Said no fevers chills.  She does have questions about her short-term disability and her hospital stay.  She had a prolonged hospital stay due to pain slow progress with physical therapy and the fact that she lives alone.  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  She notes at this point in time that her mouth is no longer bothering her.  Also noted that her bowel and bladder function has returned back to normal.  Review of systems: See HPI  Physical exam: General: Well-developed well-nourished pleasant female in no acute distress mood and affect appropriate.  Right knee surgical incisions healing well no signs of infection.  Calf supple nontender.  Full extension right knee flexion to 90 degrees.  Impression: Status post right total knee arthroplasty  Plan: Will see her back in just 2 weeks to see what type of progress she has made with physical therapy.  Pain medicine was changed to Norco.  Questions were encouraged and answered at length.  Will make a call on her behalf to her insurance company set up a peer to peer to discuss her hospital stay and coverage.  Also will reevaluate her return to work date at next office visit.

## 2022-02-13 ENCOUNTER — Ambulatory Visit: Payer: 59 | Admitting: Rehabilitative and Restorative Service Providers"

## 2022-02-13 ENCOUNTER — Encounter: Payer: Self-pay | Admitting: Rehabilitative and Restorative Service Providers"

## 2022-02-13 ENCOUNTER — Telehealth: Payer: Self-pay

## 2022-02-13 DIAGNOSIS — M25561 Pain in right knee: Secondary | ICD-10-CM | POA: Diagnosis not present

## 2022-02-13 DIAGNOSIS — M25661 Stiffness of right knee, not elsewhere classified: Secondary | ICD-10-CM

## 2022-02-13 DIAGNOSIS — R2681 Unsteadiness on feet: Secondary | ICD-10-CM

## 2022-02-13 DIAGNOSIS — R600 Localized edema: Secondary | ICD-10-CM

## 2022-02-13 NOTE — Telephone Encounter (Signed)
FYI  I contacted both the billing dept and pt's insurance about hospital stay denial. I was advised from the billing dept that they are working on it and at this time nothing is needed from [facility]. I lvm for pt advising her.

## 2022-02-13 NOTE — Therapy (Signed)
OUTPATIENT PHYSICAL THERAPY TREATMENT NOTE   Patient Name: Sierra Perez MRN: [id number] DOB:11-Jul-1960, 62 y.o., female Today's Date: 02/13/2022  END OF SESSION:  PT End of Session - 02/13/22 1104     Visit Number 4    Date for PT Re-Evaluation 04/01/22    Authorization Type [employer] - Visit Number 4    Authorization - Number of Visits 23    PT Start Time 1101    PT Stop Time 1150    PT Time Calculation (min) 49 min    Activity Tolerance Patient tolerated treatment well    Behavior During Therapy WFL for tasks assessed/performed              Past Medical History:  Diagnosis Date   Anemia    Anxiety    Arthritis    Asthma    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, her-2 positive, ER/PR negative   Clotting disorder ([place])    testing was negative, h/o DVT while on treatment   Colon polyps    Complex regional pain syndrome i of right lower limb    RDS   Complication of anesthesia    Cystitis    Diabetes mellitus without complication ([place])    DVT (deep venous thrombosis) ([place])    started in 2015, multiple   Family history of breast cancer    Family history of colon cancer    Fibroid    Fibromyalgia    GERD (gastroesophageal reflux disease)    Heart murmur    HLD (hyperlipidemia)    HPV in female    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   Hypertension    Hypothyroidism    IBS (irritable bowel syndrome)    Mitral valve prolapse    Peptic ulcer    Personal history of malignant neoplasm of breast    Sleep apnea treated with continuous positive airway pressure (CPAP)    Past Surgical History:  Procedure Laterality Date   ABDOMINAL HYSTERECTOMY  2010   fibroids, h/o abnormal pap smears   AXILLARY SENTINEL NODE BIOPSY Right 07/08/2020   Procedure: RIGHT AXILLARY SENTINEL NODE BIOPSY;  Surgeon: Rolm Bookbinder, MD;  Location: [facility];  Service: General;  Laterality: Right;   BREAST RECONSTRUCTION WITH PLACEMENT  OF TISSUE EXPANDER AND FLEX HD (ACELLULAR HYDRATED DERMIS) Bilateral 07/08/2020   Procedure: BILATERAL BREAST RECONSTRUCTION WITH  FLEX HD (ACELLULAR HYDRATED DERMIS),DIRECT IMPLANT RECONSTRUCTION;  Surgeon: Cindra Presume, MD;  Location: [facility];  Service: Plastics;  Laterality: Bilateral;   BREAST SURGERY Right 2015   right lumpectomy with reduction and lift   COLONOSCOPY     DENTAL SURGERY     ESOPHAGOGASTRODUODENOSCOPY     LAPAROSCOPIC GASTRIC SLEEVE RESECTION  2014   LAPAROSCOPIC OOPHERECTOMY Bilateral 2016   REPLACEMENT TOTAL KNEE Left 2018   also had 3 prior arthroscopies   TOE SURGERY Right    dislocated toe   TOTAL HIP ARTHROPLASTY Right 11/19/2020   Procedure: RIGHT TOTAL HIP ARTHROPLASTY ANTERIOR APPROACH;  Surgeon: Mcarthur Rossetti, MD;  Location: [facility];  Service: Orthopedics;  Laterality: Right;   TOTAL KNEE ARTHROPLASTY Right 01/09/2022   Procedure: RIGHT TOTAL KNEE ARTHROPLASTY;  Surgeon: Mcarthur Rossetti, MD;  Location: [facility];  Service: Orthopedics;  Laterality: Right;   TOTAL MASTECTOMY Bilateral 07/08/2020   Procedure: BILATERAL TOTAL MASTECTOMY;  Surgeon: Rolm Bookbinder, MD;  Location: [facility];  Service: General;  Laterality: Bilateral;   VENA  CAVA FILTER PLACEMENT  2017   Patient Active Problem List   Diagnosis Date Noted   Status post total right knee replacement 01/09/2022   Obesity (BMI 30-39.9) 06/23/2021   Iron deficiency anemia 01/23/2021   Status post right hip replacement 12/03/2020   Status post total replacement of right hip 11/19/2020   Breast cancer ([place]) 07/08/2020   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   H/O total hysterectomy 06/11/2020   Genetic testing 05/27/2020   Chronic superficial gastritis 05/27/2020   Fibromyalgia 05/27/2020   GERD (gastroesophageal reflux disease) 05/27/2020   Prediabetes 05/27/2020   Complex regional pain syndrome I, unspecified 05/27/2020   Atypical lobular hyperplasia [facility]) of left breast 05/23/2020    History of therapeutic radiation 05/23/2020   Family history of breast cancer    Family history of colon cancer    Personal history of malignant neoplasm of breast    Unilateral primary osteoarthritis, right knee 02/26/2020   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   DVT (deep venous thrombosis) ([place]) 05/04/2018   Malignant neoplasm of upper-outer quadrant of right breast in female, estrogen receptor negative ([place]) 05/03/2018   Obstructive sleep apnea (adult) (pediatric) 09/14/2017   Adenopathy 07/15/2017   B12 deficiency 07/15/2017   Disorder of tympanic membrane of right ear 07/15/2017   Recurrent UTI 03/09/2017   Bladder pain 02/25/2017   Proteinuria 02/25/2017   Hematoma of thigh, left, subsequent encounter 08/16/2016   At risk for obstructive sleep apnea 07/01/2016   Suspected sleep apnea 07/01/2016   Arthritis of left knee 05/26/2016   Localized primary osteoarthritis of lower leg, left 05/26/2016   Clotting disorder ([place]) 05/22/2016   Chronic anticoagulation 01/06/2016   Right-sided epistaxis 01/06/2016   Urinary tract infection, site not specified 08/12/2015   Abnormal Pap smear of vagina 05/06/2015   Follow-up examination, following other surgery 12/14/2013   Pelvic and perineal pain 11/28/2013   Anxiety and depression 11/16/2013   H/O bariatric surgery 11/16/2013   Atrophic vaginitis 11/13/2013   Tear of lateral cartilage or meniscus of knee, current 04/14/2013   Allergic rhinitis 01/16/2013   Female genital symptoms 11/10/2012   Symptomatic menopausal or female climacteric states 11/10/2012   Disequilibrium 06/09/2012   Morbid obesity ([place]) 02/09/2012   Vitamin D deficiency 09/13/2008   Preop examination 04/20/2008   Hypothyroidism 05/20/2007    PCP: Wenda Low, MD  REFERRING PROVIDER: Erskine Emery, PA-C  REFERRING DIAG: s/p Rt TKA  THERAPY DIAG:  Acute pain of right knee  Stiffness of right knee, not elsewhere classified  Localized edema  Unsteadiness on  feet  Rationale for Evaluation and Treatment: Rehabilitation  ONSET DATE: 01/09/22  SUBJECTIVE:   SUBJECTIVE STATEMENT: Pt reports that she saw the PA for the surgeon and he wants her to come back in 2 weeks to assess her ROM.  PERTINENT HISTORY: Rt THA 2022, Rt TKA 01/09/22, Lt TKA 2018, Hx of Breast Cancer s/p double mastectomy with sentinel lymph node removed from right side  PAIN:  Are you having pain? Yes Reports 6/10 pain in right knee  PRECAUTIONS: Knee, s/p bilateral mastectomy  WEIGHT BEARING RESTRICTIONS: No  FALLS:  Has patient fallen in last 6 months? No  LIVING ENVIRONMENT: Lives with: lives alone Lives in: House/apartment Stairs: Yes: Internal: 1 flight steps; handrails both sides Has following equipment at home: walker and cane  OCCUPATION:   PLOF: Independent  PATIENT GOALS: be able to get upstairs to shower   NEXT MD VISIT: 03/02/22  OBJECTIVE:   DIAGNOSTIC FINDINGS:  PATIENT SURVEYS:  Eval:  FOTO 39, goal 31  COGNITION: Overall cognitive status: Within functional limits for tasks assessed     SENSATION: Not tested   POSTURE: weight shift left  PALPATION: Scar adhesions noted Rt surgical incision   LOWER EXTREMITY ROM: Eval: Right knee flexion 18-75 Left knee is [facility]  02/09/2022: Right knee flexion 91 degrees in supine  02/11/2022: Supine A/ROM Right knee:  0 - 93 deg Seated A/ROM Right knee:  5 - 82 deg Seated left knee A/ROM 0-123 deg  02/13/2022: Seated A/ROM Right knee:  5 - 95 deg  LOWER EXTREMITY MMT:  MMT Right eval Left eval  Hip flexion 3 5   Hip extension    Hip abduction 3+   Hip adduction    Hip internal rotation    Hip external rotation    Knee flexion    Knee extension    Ankle dorsiflexion    Ankle plantarflexion    Ankle inversion    Ankle eversion     (Blank rows = not tested)  LOWER EXTREMITY SPECIAL TESTS:    FUNCTIONAL TESTS:  Eval: 5 times sit to stand: 13 sec, 2 HHA, weight shifted  Lt Timed up and go (TUG): 16 sec, using RW  GAIT: Distance walked: through clinic to treatment room and gym Assistive device utilized: Environmental consultant - 2 wheeled Level of assistance: Modified independence Comments: Rt LE decreased knee flexion/hip extension during swing phase and minimal PF noted during pushoff   TODAY'S TREATMENT:                                                                                                                               DATE: 02/13/2022 Nustep level 2 (seat at 10 on green machine) x6 minutes with PT present to discuss status Seated:  heel/toe raises, marching, LAQ, knee flexion stretch with foot on slider.  RLE 2x10 Seated A/ROM:  5 - 95 deg Standing at RW:  hamstring curls and marching.  2x10 bilat Supine:  straight leg raise, short arc quad, knee flexion with strap.  RLE 2x10 (able to achieve 102 deg knee flexion during stretching with strap) Bridging 2x10 Game Ready to right knee:  3 snowflakes, medium compression, x10 min   DATE: 02/11/2022 Nustep level 2 (seat at 10 on green machine) x6 minutes with PT present to discuss status Seated:  heel/toe raises, marching, LAQ.  RLE 2x10 Supine:  quad sets, straight leg raise, SAQ.  RLE 2x10 each Supine heel flexion with strap 2x10  Supine A/ROM:  0 - 93 deg Seated A/ROM:  5 - 82 deg Game Ready to right knee:  3 snowflakes, medium compression, x10 min  02/09/21: Manual: Pt sitting off the hi lo table position in some knee flexion: Scar mobs, patella mobs, passive knee flexion stretching, LAQ 20x,  Nustep L1 x 5 min: seat 11 old model Game Ready for swelling x 15 min medium compression 3 flakes Measure knee  flexion: 91 degrees F   PATIENT EDUCATION:  Education details: eval findings/POC; importance of gaining knee extension for pain/swelling/mobility; HEP Person educated: Patient Education method: Explanation, Verbal cues, and Handouts Education comprehension: verbalized understanding and returned  demonstration  HOME EXERCISE PROGRAM: Access Code: [email or web address] URL: [email or web address] Date: 02/13/2022 Prepared by: Shelby Dubin Hillard Goodwine  Exercises - Seated Passive Knee Extension  - 3 x daily - 7 x weekly - up to 1 minute hold - Seated Knee Flexion AAROM  - 3 x daily - 7 x weekly - 30 seconds hold - Seated Long Arc Quad  - 3 x daily - 7 x weekly - 1 sets - 10 reps - 5 hold - Standing Marching  - 1-2 x daily - 7 x weekly - 1 sets - 10 reps - Standing Knee Flexion AROM with Chair Support  - 1 x daily - 7 x weekly - 2 sets - 10 reps - Long Sitting Quad Set with Towel Roll Under Heel  - 1 x daily - 7 x weekly - 3 sets - 10 reps - Active Straight Leg Raise with Quad Set  - 1 x daily - 7 x weekly - 2 sets - 10 reps - Supine Heel Slide with Strap  - 1 x daily - 7 x weekly - 2 sets - 10 reps - Supine Short Arc Quad  - 1 x daily - 7 x weekly - 2 sets - 10 reps  Patient Education - Scar Massage  ASSESSMENT:  CLINICAL IMPRESSION:  Ms Kotowski presents to skilled PT reporting that she will need to follow back up with the surgeon in 2 weeks for assessment of her range of motion and her anticipated return to work date.  Patient able to progress with increased A/ROM during visit today in sitting.  Additionally, right flexion AA/ROM was noted to increase to 102 degrees in supine today.  Patient is progressing with increased strength and able to add additional exercises today.  Updated HEP and provided new printout to patient.  Patient continues to require skilled PT to progress towards goal related activities.   OBJECTIVE IMPAIRMENTS: Abnormal gait, decreased activity tolerance, decreased balance, decreased endurance, decreased mobility, difficulty walking, decreased ROM, decreased strength, hypomobility, increased edema, increased fascial restrictions, impaired flexibility, postural dysfunction, and pain.   ACTIVITY LIMITATIONS: standing, squatting, sleeping, stairs, transfers, bed mobility,  bathing, dressing, hygiene/grooming, and locomotion level  PARTICIPATION LIMITATIONS: cleaning, laundry, driving, shopping, community activity, and yard work  PERSONAL FACTORS: Age, Fitness, Past/current experiences, Time since onset of injury/illness/exacerbation, and 3+ comorbidities: Rt THA, Lt TKA, DM, anxiety  are also affecting patient's functional outcome.   REHAB POTENTIAL: Good  CLINICAL DECISION MAKING: Evolving/moderate complexity  EVALUATION COMPLEXITY: Moderate   GOALS: Goals reviewed with patient? Yes  SHORT TERM GOALS: Target date: 02/18/22 Pt will be independent with initial HEP to improve ROM and mobility. Baseline: Goal status: Goal met 02/09/21  2.  Pt will have atleast 90 deg of Rt knee flexion Baseline:  Goal status:Goal met 02/09/21  3.  Pt will have Rt knee extension to 0 deg.  Baseline:  Goal status: IN PROGRESS    LONG TERM GOALS: Target date: 04/01/22  Pt will have 120 deg Rt knee flexion and 0 deg Rt knee extension Baseline: 75 deg flexion, 18 deg ext Goal status: INITIAL  2.  Pt will report atleast 50% improvement in Rt knee pain with ADLs. Baseline:  Goal status: INITIAL  3.  Pt will be able to ambulate with LRAD  atleast 168f with minimal compensations in her gait pattern.  Baseline:  Goal status: INITIAL  4.  Pt will be able to ascend and descend 1 flight of stairs with/without handrails and step over pattern to allow her to get upstairs to her shower. Baseline:  Goal status: INITIAL  5.  Pt will have 5/5 Rt hip/knee strength to improver her mechanics and efficiency with daily activity.  Baseline:  Goal status: INITIAL    PLAN:  PT FREQUENCY:  3x/week for 4 weeks, then decrease to 1-2x/week for 4 weeks   PT DURATION: 8 weeks  PLANNED INTERVENTIONS: Therapeutic exercises, Therapeutic activity, Neuromuscular re-education, Balance training, Gait training, Patient/Family education, Self Care, Joint mobilization, Cryotherapy, Manual  therapy, and Re-evaluation  PLAN FOR NEXT SESSION: Continue to work on knee flexion, swelling, normalized gait with cane, Nustep, quad strength   [place] PT 02/13/22 11:43 AM  [place] SLadsonGSage [place] [address]Phone # [phone] [phone]

## 2022-02-16 ENCOUNTER — Encounter: Payer: Self-pay | Admitting: Physical Therapy

## 2022-02-16 ENCOUNTER — Ambulatory Visit: Payer: 59 | Admitting: Physical Therapy

## 2022-02-16 DIAGNOSIS — M25561 Pain in right knee: Secondary | ICD-10-CM | POA: Diagnosis not present

## 2022-02-16 DIAGNOSIS — R2681 Unsteadiness on feet: Secondary | ICD-10-CM

## 2022-02-16 DIAGNOSIS — R600 Localized edema: Secondary | ICD-10-CM

## 2022-02-16 DIAGNOSIS — Z483 Aftercare following surgery for neoplasm: Secondary | ICD-10-CM

## 2022-02-16 DIAGNOSIS — M6281 Muscle weakness (generalized): Secondary | ICD-10-CM

## 2022-02-16 DIAGNOSIS — M25661 Stiffness of right knee, not elsewhere classified: Secondary | ICD-10-CM

## 2022-02-16 NOTE — Therapy (Signed)
OUTPATIENT PHYSICAL THERAPY TREATMENT NOTE   Patient Name: Sierra Perez MRN: [id number] DOB:1960/07/18, 62 y.o., female Today's Date: 02/16/2022  END OF SESSION:  PT End of Session - 02/16/22 1026     Visit Number 5    Date for PT Re-Evaluation 04/01/22    Authorization Type [employer]    Authorization Time Period 02/04/22 to 04/01/22    Authorization - Visit Number 5    Authorization - Number of Visits 23    Progress Note Due on Visit 10    PT Start Time 1020    PT Stop Time 1120    PT Time Calculation (min) 60 min    Activity Tolerance Patient tolerated treatment well    Behavior During Therapy WFL for tasks assessed/performed               Past Medical History:  Diagnosis Date   Anemia    Anxiety    Arthritis    Asthma    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, her-2 positive, ER/PR negative   Clotting disorder ([place])    testing was negative, h/o DVT while on treatment   Colon polyps    Complex regional pain syndrome i of right lower limb    RDS   Complication of anesthesia    Cystitis    Diabetes mellitus without complication ([place])    DVT (deep venous thrombosis) ([place])    started in 2015, multiple   Family history of breast cancer    Family history of colon cancer    Fibroid    Fibromyalgia    GERD (gastroesophageal reflux disease)    Heart murmur    HLD (hyperlipidemia)    HPV in female    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   Hypertension    Hypothyroidism    IBS (irritable bowel syndrome)    Mitral valve prolapse    Peptic ulcer    Personal history of malignant neoplasm of breast    Sleep apnea treated with continuous positive airway pressure (CPAP)    Past Surgical History:  Procedure Laterality Date   ABDOMINAL HYSTERECTOMY  2010   fibroids, h/o abnormal pap smears   AXILLARY SENTINEL NODE BIOPSY Right 07/08/2020   Procedure: RIGHT AXILLARY SENTINEL NODE BIOPSY;  Surgeon: Rolm Bookbinder, MD;  Location: [facility];  Service: General;  Laterality: Right;   BREAST RECONSTRUCTION WITH PLACEMENT OF TISSUE EXPANDER AND FLEX HD (ACELLULAR HYDRATED DERMIS) Bilateral 07/08/2020   Procedure: BILATERAL BREAST RECONSTRUCTION WITH  FLEX HD (ACELLULAR HYDRATED DERMIS),DIRECT IMPLANT RECONSTRUCTION;  Surgeon: Cindra Presume, MD;  Location: [facility];  Service: Plastics;  Laterality: Bilateral;   BREAST SURGERY Right 2015   right lumpectomy with reduction and lift   COLONOSCOPY     DENTAL SURGERY     ESOPHAGOGASTRODUODENOSCOPY     LAPAROSCOPIC GASTRIC SLEEVE RESECTION  2014   LAPAROSCOPIC OOPHERECTOMY Bilateral 2016   REPLACEMENT TOTAL KNEE Left 2018   also had 3 prior arthroscopies   TOE SURGERY Right    dislocated toe   TOTAL HIP ARTHROPLASTY Right 11/19/2020   Procedure: RIGHT TOTAL HIP ARTHROPLASTY ANTERIOR APPROACH;  Surgeon: Mcarthur Rossetti, MD;  Location: [facility];  Service: Orthopedics;  Laterality: Right;   TOTAL KNEE ARTHROPLASTY Right 01/09/2022   Procedure: RIGHT TOTAL KNEE ARTHROPLASTY;  Surgeon: Mcarthur Rossetti, MD;  Location: [facility];  Service: Orthopedics;  Laterality: Right;   TOTAL MASTECTOMY Bilateral 07/08/2020   Procedure: BILATERAL TOTAL  MASTECTOMY;  Surgeon: Rolm Bookbinder, MD;  Location: [facility];  Service: General;  Laterality: Bilateral;   VENA CAVA FILTER PLACEMENT  2017   Patient Active Problem List   Diagnosis Date Noted   Status post total right knee replacement 01/09/2022   Obesity (BMI 30-39.9) 06/23/2021   Iron deficiency anemia 01/23/2021   Status post right hip replacement 12/03/2020   Status post total replacement of right hip 11/19/2020   Breast cancer ([place]) 07/08/2020   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   H/O total hysterectomy 06/11/2020   Genetic testing 05/27/2020   Chronic superficial gastritis 05/27/2020   Fibromyalgia 05/27/2020   GERD (gastroesophageal reflux disease) 05/27/2020   Prediabetes 05/27/2020   Complex regional pain syndrome I,  unspecified 05/27/2020   Atypical lobular hyperplasia [facility]) of left breast 05/23/2020   History of therapeutic radiation 05/23/2020   Family history of breast cancer    Family history of colon cancer    Personal history of malignant neoplasm of breast    Unilateral primary osteoarthritis, right knee 02/26/2020   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   DVT (deep venous thrombosis) ([place]) 05/04/2018   Malignant neoplasm of upper-outer quadrant of right breast in female, estrogen receptor negative ([place]) 05/03/2018   Obstructive sleep apnea (adult) (pediatric) 09/14/2017   Adenopathy 07/15/2017   B12 deficiency 07/15/2017   Disorder of tympanic membrane of right ear 07/15/2017   Recurrent UTI 03/09/2017   Bladder pain 02/25/2017   Proteinuria 02/25/2017   Hematoma of thigh, left, subsequent encounter 08/16/2016   At risk for obstructive sleep apnea 07/01/2016   Suspected sleep apnea 07/01/2016   Arthritis of left knee 05/26/2016   Localized primary osteoarthritis of lower leg, left 05/26/2016   Clotting disorder ([place]) 05/22/2016   Chronic anticoagulation 01/06/2016   Right-sided epistaxis 01/06/2016   Urinary tract infection, site not specified 08/12/2015   Abnormal Pap smear of vagina 05/06/2015   Follow-up examination, following other surgery 12/14/2013   Pelvic and perineal pain 11/28/2013   Anxiety and depression 11/16/2013   H/O bariatric surgery 11/16/2013   Atrophic vaginitis 11/13/2013   Tear of lateral cartilage or meniscus of knee, current 04/14/2013   Allergic rhinitis 01/16/2013   Female genital symptoms 11/10/2012   Symptomatic menopausal or female climacteric states 11/10/2012   Disequilibrium 06/09/2012   Morbid obesity ([place]) 02/09/2012   Vitamin D deficiency 09/13/2008   Preop examination 04/20/2008   Hypothyroidism 05/20/2007    PCP: Wenda Low, MD  REFERRING PROVIDER: Erskine Emery, PA-C  REFERRING DIAG: s/p Rt TKA  THERAPY DIAG:  Acute pain of right  knee  Stiffness of right knee, not elsewhere classified  Localized edema  Unsteadiness on feet  Aftercare following surgery for neoplasm  Muscle weakness (generalized)  Rationale for Evaluation and Treatment: Rehabilitation  ONSET DATE: 01/09/22  SUBJECTIVE:   SUBJECTIVE STATEMENT: Both knees achy from weather. Swelling is coming down.   PERTINENT HISTORY: Rt THA 2022, Rt TKA 01/09/22, Lt TKA 2018, Hx of Breast Cancer s/p double mastectomy with sentinel lymph node removed from right side  PAIN:  Are you having pain? Yes Reports 6/10 pain in right knee  PRECAUTIONS: Knee, s/p bilateral mastectomy  WEIGHT BEARING RESTRICTIONS: No  FALLS:  Has patient fallen in last 6 months? No  LIVING ENVIRONMENT: Lives with: lives alone Lives in: House/apartment Stairs: Yes: Internal: 1 flight steps; handrails both sides Has following equipment at home: walker and cane  OCCUPATION:   PLOF: Independent  PATIENT GOALS: be able to get upstairs to  shower   NEXT MD VISIT: 03/02/22  OBJECTIVE:   DIAGNOSTIC FINDINGS:   PATIENT SURVEYS:  Eval:  FOTO 39, goal 29  COGNITION: Overall cognitive status: Within functional limits for tasks assessed     SENSATION: Not tested   POSTURE: weight shift left  PALPATION: Scar adhesions noted Rt surgical incision   LOWER EXTREMITY ROM: Eval: Right knee flexion 18-75 Left knee is [facility]  02/09/2022: Right knee flexion 91 degrees in supine  02/11/2022: Supine A/ROM Right knee:  0 - 93 deg Seated A/ROM Right knee:  5 - 82 deg Seated left knee A/ROM 0-123 deg  02/13/2022: Seated A/ROM Right knee:  5 - 95 deg  02/16/22: Knee flexion: 106 in supine  LOWER EXTREMITY MMT:  MMT Right eval Left eval  Hip flexion 3 5   Hip extension    Hip abduction 3+   Hip adduction    Hip internal rotation    Hip external rotation    Knee flexion    Knee extension    Ankle dorsiflexion    Ankle plantarflexion    Ankle inversion    Ankle  eversion     (Blank rows = not tested)  LOWER EXTREMITY SPECIAL TESTS:    FUNCTIONAL TESTS:  Eval: 5 times sit to stand: 13 sec, 2 HHA, weight shifted Lt Timed up and go (TUG): 16 sec, using RW  GAIT: Distance walked: through clinic to treatment room and gym Assistive device utilized: Environmental consultant - 2 wheeled Level of assistance: Modified independence Comments: Rt LE decreased knee flexion/hip extension during swing phase and minimal PF noted during pushoff   TODAY'S TREATMENT:     02/16/22:      Nustep old model: L1 8 min with PTA present to discuss current status.    Manual: Pt sitting off the hi lo table position in some knee flexion: Scar mobs, patella mobs, passive knee flexion stretching, edema massage LAQ: 0# 3x5 CREEP theory knee flexion stretching: educated pt how to do this at home 2x day. AROM in supine 106 degrees F   Game Ready to right knee:  3 snowflakes, medium compression, x10 min                                                                                                                 DATE: 02/13/2022 Nustep level 2 (seat at 10 on green machine) x6 minutes with PT present to discuss status Seated:  heel/toe raises, marching, LAQ, knee flexion stretch with foot on slider.  RLE 2x10 Seated A/ROM:  5 - 95 deg Standing at RW:  hamstring curls and marching.  2x10 bilat Supine:  straight leg raise, short arc quad, knee flexion with strap.  RLE 2x10 (able to achieve 102 deg knee flexion during stretching with strap) Bridging 2x10 Game Ready to right knee:  3 snowflakes, medium compression, x10 min   DATE: 02/11/2022 Nustep level 2 (seat at 10 on green machine) x6 minutes with PT present to discuss status Seated:  heel/toe raises, marching, LAQ.  RLE 2x10 Supine:  quad sets, straight leg raise, SAQ.  RLE 2x10 each Supine heel flexion with strap 2x10  Supine A/ROM:  0 - 93 deg Seated A/ROM:  5 - 82 deg Game Ready to right knee:  3 snowflakes, medium compression, x10  min  02/09/21: Manual: Pt sitting off the hi lo table position in some knee flexion: Scar mobs, patella mobs, passive knee flexion stretching, LAQ 20x,  Nustep L1 x 5 min: seat 49 old model Game Ready for swelling x 15 min medium compression 3 flakes Measure knee flexion: 91 degrees F   PATIENT EDUCATION:  Education details: eval findings/POC; importance of gaining knee extension for pain/swelling/mobility; HEP Person educated: Patient Education method: Explanation, Verbal cues, and Handouts Education comprehension: verbalized understanding and returned demonstration  HOME EXERCISE PROGRAM: Access Code: [facility] URL: [email or web address] Date: 02/13/2022 Prepared by: Shelby Dubin Menke  Exercises - Seated Passive Knee Extension  - 3 x daily - 7 x weekly - up to 1 minute hold - Seated Knee Flexion AAROM  - 3 x daily - 7 x weekly - 30 seconds hold - Seated Long Arc Quad  - 3 x daily - 7 x weekly - 1 sets - 10 reps - 5 hold - Standing Marching  - 1-2 x daily - 7 x weekly - 1 sets - 10 reps - Standing Knee Flexion AROM with Chair Support  - 1 x daily - 7 x weekly - 2 sets - 10 reps - Long Sitting Quad Set with Towel Roll Under Heel  - 1 x daily - 7 x weekly - 3 sets - 10 reps - Active Straight Leg Raise with Quad Set  - 1 x daily - 7 x weekly - 2 sets - 10 reps - Supine Heel Slide with Strap  - 1 x daily - 7 x weekly - 2 sets - 10 reps - Supine Short Arc Quad  - 1 x daily - 7 x weekly - 2 sets - 10 reps  Patient Education - Scar Massage  ASSESSMENT:  CLINICAL IMPRESSION: Pt achieved 106 degrees knee flexion post treatment. PTA  educated pt in how to use the Creep theory when stretching her knee at home into flexion. This will help pt push deeper into flexion without the pain and should suit her well. Swelling continues to reduce. Pt continues to feel most comfortbale using her walker.    OBJECTIVE IMPAIRMENTS: Abnormal gait, decreased activity tolerance, decreased balance,  decreased endurance, decreased mobility, difficulty walking, decreased ROM, decreased strength, hypomobility, increased edema, increased fascial restrictions, impaired flexibility, postural dysfunction, and pain.   ACTIVITY LIMITATIONS: standing, squatting, sleeping, stairs, transfers, bed mobility, bathing, dressing, hygiene/grooming, and locomotion level  PARTICIPATION LIMITATIONS: cleaning, laundry, driving, shopping, community activity, and yard work  PERSONAL FACTORS: Age, Fitness, Past/current experiences, Time since onset of injury/illness/exacerbation, and 3+ comorbidities: Rt THA, Lt TKA, DM, anxiety  are also affecting patient's functional outcome.   REHAB POTENTIAL: Good  CLINICAL DECISION MAKING: Evolving/moderate complexity  EVALUATION COMPLEXITY: Moderate   GOALS: Goals reviewed with patient? Yes  SHORT TERM GOALS: Target date: 02/18/22 Pt will be independent with initial HEP to improve ROM and mobility. Baseline: Goal status: Goal met 02/09/21  2.  Pt will have atleast 90 deg of Rt knee flexion Baseline:  Goal status:Goal met 02/09/21  3.  Pt will have Rt knee extension to 0 deg.  Baseline:  Goal status: IN PROGRESS    LONG TERM GOALS: Target date: 04/01/22  Pt will have  120 deg Rt knee flexion and 0 deg Rt knee extension Baseline: 75 deg flexion, 18 deg ext Goal status: INITIAL  2.  Pt will report atleast 50% improvement in Rt knee pain with ADLs. Baseline:  Goal status: INITIAL  3.  Pt will be able to ambulate with LRAD atleast 130f with minimal compensations in her gait pattern.  Baseline:  Goal status: INITIAL  4.  Pt will be able to ascend and descend 1 flight of stairs with/without handrails and step over pattern to allow her to get upstairs to her shower. Baseline:  Goal status: INITIAL  5.  Pt will have 5/5 Rt hip/knee strength to improver her mechanics and efficiency with daily activity.  Baseline:  Goal status: INITIAL    PLAN:  PT  FREQUENCY:  3x/week for 4 weeks, then decrease to 1-2x/week for 4 weeks   PT DURATION: 8 weeks  PLANNED INTERVENTIONS: Therapeutic exercises, Therapeutic activity, Neuromuscular re-education, Balance training, Gait training, Patient/Family education, Self Care, Joint mobilization, Cryotherapy, Manual therapy, and Re-evaluation  PLAN FOR NEXT SESSION: Continue to work on knee flexion, swelling, normalized gait with cane, Nustep, quad strength   JMyrene Galas PTA 02/16/22 11:32 AM   [address] [place] [place] [id number] # [phone] [phone]

## 2022-02-18 ENCOUNTER — Ambulatory Visit: Payer: 59 | Admitting: Rehabilitative and Restorative Service Providers"

## 2022-02-18 ENCOUNTER — Encounter: Payer: Self-pay | Admitting: Rehabilitative and Restorative Service Providers"

## 2022-02-18 DIAGNOSIS — R600 Localized edema: Secondary | ICD-10-CM

## 2022-02-18 DIAGNOSIS — M25561 Pain in right knee: Secondary | ICD-10-CM

## 2022-02-18 DIAGNOSIS — M25661 Stiffness of right knee, not elsewhere classified: Secondary | ICD-10-CM

## 2022-02-18 DIAGNOSIS — R2681 Unsteadiness on feet: Secondary | ICD-10-CM

## 2022-02-18 NOTE — Therapy (Signed)
OUTPATIENT PHYSICAL THERAPY TREATMENT NOTE   Patient Name: Sierra Perez MRN: [id number] DOB:30-Aug-1960, 62 y.o., female Today's Date: 02/18/2022  END OF SESSION:  PT End of Session - 02/18/22 0935     Visit Number 6    Date for PT Re-Evaluation 04/01/22    Authorization Type [employer]    Progress Note Due on Visit 10    PT Start Time 0930    PT Stop Time 1020    PT Time Calculation (min) 50 min    Activity Tolerance Patient tolerated treatment well    Behavior During Therapy WFL for tasks assessed/performed               Past Medical History:  Diagnosis Date   Anemia    Anxiety    Arthritis    Asthma    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, her-2 positive, ER/PR negative   Clotting disorder ([place])    testing was negative, h/o DVT while on treatment   Colon polyps    Complex regional pain syndrome i of right lower limb    RDS   Complication of anesthesia    Cystitis    Diabetes mellitus without complication ([place])    DVT (deep venous thrombosis) ([place])    started in 2015, multiple   Family history of breast cancer    Family history of colon cancer    Fibroid    Fibromyalgia    GERD (gastroesophageal reflux disease)    Heart murmur    HLD (hyperlipidemia)    HPV in female    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   Hypertension    Hypothyroidism    IBS (irritable bowel syndrome)    Mitral valve prolapse    Peptic ulcer    Personal history of malignant neoplasm of breast    Sleep apnea treated with continuous positive airway pressure (CPAP)    Past Surgical History:  Procedure Laterality Date   ABDOMINAL HYSTERECTOMY  2010   fibroids, h/o abnormal pap smears   AXILLARY SENTINEL NODE BIOPSY Right 07/08/2020   Procedure: RIGHT AXILLARY SENTINEL NODE BIOPSY;  Surgeon: Rolm Bookbinder, MD;  Location: [place];  Service: General;  Laterality: Right;   BREAST RECONSTRUCTION WITH PLACEMENT OF TISSUE EXPANDER AND FLEX HD  (ACELLULAR HYDRATED DERMIS) Bilateral 07/08/2020   Procedure: BILATERAL BREAST RECONSTRUCTION WITH  FLEX HD (ACELLULAR HYDRATED DERMIS),DIRECT IMPLANT RECONSTRUCTION;  Surgeon: Cindra Presume, MD;  Location: [place];  Service: Plastics;  Laterality: Bilateral;   BREAST SURGERY Right 2015   right lumpectomy with reduction and lift   COLONOSCOPY     DENTAL SURGERY     ESOPHAGOGASTRODUODENOSCOPY     LAPAROSCOPIC GASTRIC SLEEVE RESECTION  2014   LAPAROSCOPIC OOPHERECTOMY Bilateral 2016   REPLACEMENT TOTAL KNEE Left 2018   also had 3 prior arthroscopies   TOE SURGERY Right    dislocated toe   TOTAL HIP ARTHROPLASTY Right 11/19/2020   Procedure: RIGHT TOTAL HIP ARTHROPLASTY ANTERIOR APPROACH;  Surgeon: Mcarthur Rossetti, MD;  Location: [place];  Service: Orthopedics;  Laterality: Right;   TOTAL KNEE ARTHROPLASTY Right 01/09/2022   Procedure: RIGHT TOTAL KNEE ARTHROPLASTY;  Surgeon: Mcarthur Rossetti, MD;  Location: [facility];  Service: Orthopedics;  Laterality: Right;   TOTAL MASTECTOMY Bilateral 07/08/2020   Procedure: BILATERAL TOTAL MASTECTOMY;  Surgeon: Rolm Bookbinder, MD;  Location: [place];  Service: General;  Laterality: Bilateral;   VENA CAVA FILTER PLACEMENT  2017  Patient Active Problem List   Diagnosis Date Noted   Status post total right knee replacement 01/09/2022   Obesity (BMI 30-39.9) 06/23/2021   Iron deficiency anemia 01/23/2021   Status post right hip replacement 12/03/2020   Status post total replacement of right hip 11/19/2020   Breast cancer ([place]) 07/08/2020   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   H/O total hysterectomy 06/11/2020   Genetic testing 05/27/2020   Chronic superficial gastritis 05/27/2020   Fibromyalgia 05/27/2020   GERD (gastroesophageal reflux disease) 05/27/2020   Prediabetes 05/27/2020   Complex regional pain syndrome I, unspecified 05/27/2020   Atypical lobular hyperplasia [facility]) of left breast 05/23/2020   History of therapeutic radiation  05/23/2020   Family history of breast cancer    Family history of colon cancer    Personal history of malignant neoplasm of breast    Unilateral primary osteoarthritis, right knee 02/26/2020   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   DVT (deep venous thrombosis) ([place]) 05/04/2018   Malignant neoplasm of upper-outer quadrant of right breast in female, estrogen receptor negative ([place]) 05/03/2018   Obstructive sleep apnea (adult) (pediatric) 09/14/2017   Adenopathy 07/15/2017   B12 deficiency 07/15/2017   Disorder of tympanic membrane of right ear 07/15/2017   Recurrent UTI 03/09/2017   Bladder pain 02/25/2017   Proteinuria 02/25/2017   Hematoma of thigh, left, subsequent encounter 08/16/2016   At risk for obstructive sleep apnea 07/01/2016   Suspected sleep apnea 07/01/2016   Arthritis of left knee 05/26/2016   Localized primary osteoarthritis of lower leg, left 05/26/2016   Clotting disorder ([place]) 05/22/2016   Chronic anticoagulation 01/06/2016   Right-sided epistaxis 01/06/2016   Urinary tract infection, site not specified 08/12/2015   Abnormal Pap smear of vagina 05/06/2015   Follow-up examination, following other surgery 12/14/2013   Pelvic and perineal pain 11/28/2013   Anxiety and depression 11/16/2013   H/O bariatric surgery 11/16/2013   Atrophic vaginitis 11/13/2013   Tear of lateral cartilage or meniscus of knee, current 04/14/2013   Allergic rhinitis 01/16/2013   Female genital symptoms 11/10/2012   Symptomatic menopausal or female climacteric states 11/10/2012   Disequilibrium 06/09/2012   Morbid obesity ([place]) 02/09/2012   Vitamin D deficiency 09/13/2008   Preop examination 04/20/2008   Hypothyroidism 05/20/2007    PCP: Wenda Low, MD  REFERRING PROVIDER: Erskine Emery, PA-C  REFERRING DIAG: s/p Rt TKA  THERAPY DIAG:  Acute pain of right knee  Stiffness of right knee, not elsewhere classified  Localized edema  Unsteadiness on feet  Rationale for Evaluation and  Treatment: Rehabilitation  ONSET DATE: 01/09/22  SUBJECTIVE:   SUBJECTIVE STATEMENT: Pt reports that she is still having some increased right knee pain and feels like her gait pattern is not correct.  PERTINENT HISTORY: Rt THA 2022, Rt TKA 01/09/22, Lt TKA 2018, Hx of Breast Cancer s/p double mastectomy with sentinel lymph node removed from right side  PAIN:  Are you having pain? Yes Reports 5/10 pain in right knee  PRECAUTIONS: Knee, s/p bilateral mastectomy  WEIGHT BEARING RESTRICTIONS: No  FALLS:  Has patient fallen in last 6 months? No  LIVING ENVIRONMENT: Lives with: lives alone Lives in: House/apartment Stairs: Yes: Internal: 1 flight steps; handrails both sides Has following equipment at home: walker and cane  OCCUPATION:   PLOF: Independent  PATIENT GOALS: be able to get upstairs to shower   NEXT MD VISIT: 03/02/22  OBJECTIVE:   DIAGNOSTIC FINDINGS:   PATIENT SURVEYS:  Eval:  FOTO 39, goal 75  COGNITION: Overall cognitive status: Within functional limits for tasks assessed     SENSATION: Not tested   POSTURE: weight shift left  PALPATION: Scar adhesions noted Rt surgical incision   LOWER EXTREMITY ROM: Eval: Right knee flexion 18-75 Left knee is [facility]  02/09/2022: Right knee flexion 91 degrees in supine  02/11/2022: Supine A/ROM Right knee:  0 - 93 deg Seated A/ROM Right knee:  5 - 82 deg Seated left knee A/ROM 0-123 deg  02/13/2022: Seated A/ROM Right knee:  5 - 95 deg  02/16/22: Knee flexion: 106 in supine  LOWER EXTREMITY MMT:  MMT Right eval Left eval  Hip flexion 3 5   Hip extension    Hip abduction 3+   Hip adduction    Hip internal rotation    Hip external rotation    Knee flexion    Knee extension    Ankle dorsiflexion    Ankle plantarflexion    Ankle inversion    Ankle eversion     (Blank rows = not tested)  LOWER EXTREMITY SPECIAL TESTS:    FUNCTIONAL TESTS:  Eval: 5 times sit to stand: 13 sec, 2 HHA, weight  shifted Lt Timed up and go (TUG): 16 sec, using RW  02/18/2022: 3 minute walk test:  341 ft with RW  GAIT: Distance walked: through clinic to treatment room and gym Assistive device utilized: Environmental consultant - 2 wheeled Level of assistance: Modified independence Comments: Rt LE decreased knee flexion/hip extension during swing phase and minimal PF noted during pushoff   TODAY'S TREATMENT:     DATE: 02/18/2022 Nustep level 4 (seat at 8 on green machine) x6 minutes with PT present to discuss status 3 min amb with RW for cuing for improved gait pattern with increased weight through RLE Seated:  marching, LAQ, knee flexion stretch with foot on slider.  RLE 2x10 Seated right knee AA/ROM: to 0-107 degrees Standing with RW:  heel raises, marching, hamstring curls.  BLE 2x10 each Leg Press (seat at 8) 50# 2x10 Game Ready to right knee:  3 snowflakes, medium compression, x10 min   02/16/22:      Nustep old model: L1 8 min with PTA present to discuss current status.    Manual: Pt sitting off the hi lo table position in some knee flexion: Scar mobs, patella mobs, passive knee flexion stretching, edema massage LAQ: 0# 3x5 CREEP theory knee flexion stretching: educated pt how to do this at home 2x day. AROM in supine 106 degrees F   Game Ready to right knee:  3 snowflakes, medium compression, x10 min                                                                                                                 DATE: 02/13/2022 Nustep level 2 (seat at 10 on green machine) x6 minutes with PT present to discuss status Seated:  heel/toe raises, marching, LAQ, knee flexion stretch with foot on slider.  RLE 2x10 Seated A/ROM:  5 - 95 deg Standing at RW:  hamstring curls and marching.  2x10 bilat Supine:  straight leg raise, short arc quad, knee flexion with strap.  RLE 2x10 (able to achieve 102 deg knee flexion during stretching with strap) Bridging 2x10 Game Ready to right knee:  3 snowflakes, medium  compression, x10 min    PATIENT EDUCATION:  Education details: eval findings/POC; importance of gaining knee extension for pain/swelling/mobility; HEP Person educated: Patient Education method: Explanation, Verbal cues, and Handouts Education comprehension: verbalized understanding and returned demonstration  HOME EXERCISE PROGRAM: Access Code: [facility] URL: [email or web address] Date: 02/13/2022 Prepared by: Shelby Dubin Jerico Grisso  Exercises - Seated Passive Knee Extension  - 3 x daily - 7 x weekly - up to 1 minute hold - Seated Knee Flexion AAROM  - 3 x daily - 7 x weekly - 30 seconds hold - Seated Long Arc Quad  - 3 x daily - 7 x weekly - 1 sets - 10 reps - 5 hold - Standing Marching  - 1-2 x daily - 7 x weekly - 1 sets - 10 reps - Standing Knee Flexion AROM with Chair Support  - 1 x daily - 7 x weekly - 2 sets - 10 reps - Long Sitting Quad Set with Towel Roll Under Heel  - 1 x daily - 7 x weekly - 3 sets - 10 reps - Active Straight Leg Raise with Quad Set  - 1 x daily - 7 x weekly - 2 sets - 10 reps - Supine Heel Slide with Strap  - 1 x daily - 7 x weekly - 2 sets - 10 reps - Supine Short Arc Quad  - 1 x daily - 7 x weekly - 2 sets - 10 reps  Patient Education - Scar Massage  ASSESSMENT:  CLINICAL IMPRESSION:  Ms Begin continues to make progress towards increased R knee A/ROM.  Patient continues to be able to progress with strengthening and was able to add in leg press during session today with low weights.  PT adjusted RW to a more appropriate level for patient and she reported increased comfort with ambulation.  Pt continues to report relief of pain with game ready.   OBJECTIVE IMPAIRMENTS: Abnormal gait, decreased activity tolerance, decreased balance, decreased endurance, decreased mobility, difficulty walking, decreased ROM, decreased strength, hypomobility, increased edema, increased fascial restrictions, impaired flexibility, postural dysfunction, and pain.    ACTIVITY LIMITATIONS: standing, squatting, sleeping, stairs, transfers, bed mobility, bathing, dressing, hygiene/grooming, and locomotion level  PARTICIPATION LIMITATIONS: cleaning, laundry, driving, shopping, community activity, and yard work  PERSONAL FACTORS: Age, Fitness, Past/current experiences, Time since onset of injury/illness/exacerbation, and 3+ comorbidities: Rt THA, Lt TKA, DM, anxiety  are also affecting patient's functional outcome.   REHAB POTENTIAL: Good  CLINICAL DECISION MAKING: Evolving/moderate complexity  EVALUATION COMPLEXITY: Moderate   GOALS: Goals reviewed with patient? Yes  SHORT TERM GOALS: Target date: 02/18/22 Pt will be independent with initial HEP to improve ROM and mobility. Baseline: Goal status: Goal met 02/09/21  2.  Pt will have atleast 90 deg of Rt knee flexion Baseline:  Goal status:Goal met 02/09/21  3.  Pt will have Rt knee extension to 0 deg.  Baseline:  Goal status: MET on 02/18/2022    LONG TERM GOALS: Target date: 04/01/22  Pt will have 120 deg Rt knee flexion and 0 deg Rt knee extension Baseline: 75 deg flexion, 18 deg ext Goal status: IN PROGRESS  2.  Pt will report atleast 50% improvement in Rt knee pain with ADLs. Baseline:  Goal status: INITIAL  3.  Pt will be able to ambulate with LRAD atleast 167f with minimal compensations in her gait pattern.  Baseline:  Goal status: INITIAL  4.  Pt will be able to ascend and descend 1 flight of stairs with/without handrails and step over pattern to allow her to get upstairs to her shower. Baseline:  Goal status: INITIAL  5.  Pt will have 5/5 Rt hip/knee strength to improver her mechanics and efficiency with daily activity.  Baseline:  Goal status: INITIAL    PLAN:  PT FREQUENCY:  3x/week for 4 weeks, then decrease to 1-2x/week for 4 weeks   PT DURATION: 8 weeks  PLANNED INTERVENTIONS: Therapeutic exercises, Therapeutic activity, Neuromuscular re-education, Balance  training, Gait training, Patient/Family education, Self Care, Joint mobilization, Cryotherapy, Manual therapy, and Re-evaluation  PLAN FOR NEXT SESSION: Continue to work on knee flexion, swelling, normalized gait with cane, Nustep, quad strength   [place] 02/18/22 10:32 AM   [facility] [address] SAccovilleGSardis JA[place] [address]Phone # [phone] [phone]

## 2022-02-19 ENCOUNTER — Other Ambulatory Visit: Payer: Self-pay | Admitting: Hematology and Oncology

## 2022-02-20 ENCOUNTER — Encounter: Payer: Self-pay | Admitting: Physical Therapy

## 2022-02-20 ENCOUNTER — Ambulatory Visit: Payer: 59 | Admitting: Physical Therapy

## 2022-02-20 DIAGNOSIS — R600 Localized edema: Secondary | ICD-10-CM

## 2022-02-20 DIAGNOSIS — M25561 Pain in right knee: Secondary | ICD-10-CM | POA: Diagnosis not present

## 2022-02-20 DIAGNOSIS — R2681 Unsteadiness on feet: Secondary | ICD-10-CM

## 2022-02-20 DIAGNOSIS — M25661 Stiffness of right knee, not elsewhere classified: Secondary | ICD-10-CM

## 2022-02-20 NOTE — Therapy (Signed)
OUTPATIENT PHYSICAL THERAPY TREATMENT NOTE   Patient Name: Sierra Perez MRN: [id number] DOB:[birth year], 62 y.o., female Today's Date: 02/20/2022  END OF SESSION:  PT End of Session - 02/20/22 1021     Visit Number 7    Date for PT Re-Evaluation 04/01/22    Authorization Type [employer]    Authorization Time Period 02/04/22 to 04/01/22    Authorization - Visit Number 7    Authorization - Number of Visits 23    Progress Note Due on Visit 10    PT Start Time 5732    PT Stop Time 1115    PT Time Calculation (min) 59 min    Activity Tolerance Patient tolerated treatment well    Behavior During Therapy WFL for tasks assessed/performed               Past Medical History:  Diagnosis Date   Anemia    Anxiety    Arthritis    Asthma    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, her-2 positive, ER/PR negative   Clotting disorder ([place])    testing was negative, h/o DVT while on treatment   Colon polyps    Complex regional pain syndrome i of right lower limb    RDS   Complication of anesthesia    Cystitis    Diabetes mellitus without complication ([place])    DVT (deep venous thrombosis) ([place])    started in 2015, multiple   Family history of breast cancer    Family history of colon cancer    Fibroid    Fibromyalgia    GERD (gastroesophageal reflux disease)    Heart murmur    HLD (hyperlipidemia)    HPV in female    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   Hypertension    Hypothyroidism    IBS (irritable bowel syndrome)    Mitral valve prolapse    Peptic ulcer    Personal history of malignant neoplasm of breast    Sleep apnea treated with continuous positive airway pressure (CPAP)    Past Surgical History:  Procedure Laterality Date   ABDOMINAL HYSTERECTOMY  2010   fibroids, h/o abnormal pap smears   AXILLARY SENTINEL NODE BIOPSY Right 07/08/2020   Procedure: RIGHT AXILLARY SENTINEL NODE BIOPSY;  Surgeon: Rolm Bookbinder, MD;  Location: [place];  Service: General;  Laterality: Right;   BREAST RECONSTRUCTION WITH PLACEMENT OF TISSUE EXPANDER AND FLEX HD (ACELLULAR HYDRATED DERMIS) Bilateral 07/08/2020   Procedure: BILATERAL BREAST RECONSTRUCTION WITH  FLEX HD (ACELLULAR HYDRATED DERMIS),DIRECT IMPLANT RECONSTRUCTION;  Surgeon: Cindra Presume, MD;  Location: [place];  Service: Plastics;  Laterality: Bilateral;   BREAST SURGERY Right 2015   right lumpectomy with reduction and lift   COLONOSCOPY     DENTAL SURGERY     ESOPHAGOGASTRODUODENOSCOPY     LAPAROSCOPIC GASTRIC SLEEVE RESECTION  2014   LAPAROSCOPIC OOPHERECTOMY Bilateral 2016   REPLACEMENT TOTAL KNEE Left 2018   also had 3 prior arthroscopies   TOE SURGERY Right    dislocated toe   TOTAL HIP ARTHROPLASTY Right 11/19/2020   Procedure: RIGHT TOTAL HIP ARTHROPLASTY ANTERIOR APPROACH;  Surgeon: Mcarthur Rossetti, MD;  Location: [place];  Service: Orthopedics;  Laterality: Right;   TOTAL KNEE ARTHROPLASTY Right 01/09/2022   Procedure: RIGHT TOTAL KNEE ARTHROPLASTY;  Surgeon: Mcarthur Rossetti, MD;  Location[facility];  Service: Orthopedics;  Laterality: Right;   TOTAL MASTECTOMY Bilateral 07/08/2020   Procedure: BILATERAL TOTAL  MASTECTOMY;  Surgeon: Rolm Bookbinder, MD;  Location: [place];  Service: General;  Laterality: Bilateral;   VENA CAVA FILTER PLACEMENT  2017   Patient Active Problem List   Diagnosis Date Noted   Status post total right knee replacement 01/09/2022   Obesity (BMI 30-39.9) 06/23/2021   Iron deficiency anemia 01/23/2021   Status post right hip replacement 12/03/2020   Status post total replacement of right hip 11/19/2020   Breast cancer ([place]) 07/08/2020   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   H/O total hysterectomy 06/11/2020   Genetic testing 05/27/2020   Chronic superficial gastritis 05/27/2020   Fibromyalgia 05/27/2020   GERD (gastroesophageal reflux disease) 05/27/2020   Prediabetes 05/27/2020   Complex regional pain syndrome I,  unspecified 05/27/2020   Atypical lobular hyperplasia [facility]) of left breast 05/23/2020   History of therapeutic radiation 05/23/2020   Family history of breast cancer    Family history of colon cancer    Personal history of malignant neoplasm of breast    Unilateral primary osteoarthritis, right knee 02/26/2020   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   DVT (deep venous thrombosis) ([place]) 05/04/2018   Malignant neoplasm of upper-outer quadrant of right breast in female, estrogen receptor negative ([place]) 05/03/2018   Obstructive sleep apnea (adult) (pediatric) 09/14/2017   Adenopathy 07/15/2017   B12 deficiency 07/15/2017   Disorder of tympanic membrane of right ear 07/15/2017   Recurrent UTI 03/09/2017   Bladder pain 02/25/2017   Proteinuria 02/25/2017   Hematoma of thigh, left, subsequent encounter 08/16/2016   At risk for obstructive sleep apnea 07/01/2016   Suspected sleep apnea 07/01/2016   Arthritis of left knee 05/26/2016   Localized primary osteoarthritis of lower leg, left 05/26/2016   Clotting disorder ([place]) 05/22/2016   Chronic anticoagulation 01/06/2016   Right-sided epistaxis 01/06/2016   Urinary tract infection, site not specified 08/12/2015   Abnormal Pap smear of vagina 05/06/2015   Follow-up examination, following other surgery 12/14/2013   Pelvic and perineal pain 11/28/2013   Anxiety and depression 11/16/2013   H/O bariatric surgery 11/16/2013   Atrophic vaginitis 11/13/2013   Tear of lateral cartilage or meniscus of knee, current 04/14/2013   Allergic rhinitis 01/16/2013   Female genital symptoms 11/10/2012   Symptomatic menopausal or female climacteric states 11/10/2012   Disequilibrium 06/09/2012   Morbid obesity ([place]) 02/09/2012   Vitamin D deficiency 09/13/2008   Preop examination 04/20/2008   Hypothyroidism 05/20/2007    PCP: Wenda Low, MD  REFERRING PROVIDER: Erskine Emery, PA-C  REFERRING DIAG: s/p Rt TKA  THERAPY DIAG:  Acute pain of right  knee  Stiffness of right knee, not elsewhere classified  Localized edema  Unsteadiness on feet  Rationale for Evaluation and Treatment: Rehabilitation  ONSET DATE: 01/09/22  SUBJECTIVE:   SUBJECTIVE STATEMENT: Adjusting my walker was great I feel like I am walking better. Knee stiff and sore as usual.  PERTINENT HISTORY: Rt THA 2022, Rt TKA 01/09/22, Lt TKA 2018, Hx of Breast Cancer s/p double mastectomy with sentinel lymph node removed from right side  PAIN:  Are you having pain? Yes Reports 5/10 pain in right knee  PRECAUTIONS: Knee, s/p bilateral mastectomy  WEIGHT BEARING RESTRICTIONS: No  FALLS:  Has patient fallen in last 6 months? No  LIVING ENVIRONMENT: Lives with: lives alone Lives in: House/apartment Stairs: Yes: Internal: 1 flight steps; handrails both sides Has following equipment at home: walker and cane  OCCUPATION:   PLOF: Independent  PATIENT GOALS: be able to get upstairs to shower  NEXT MD VISIT: 03/02/22  OBJECTIVE:   DIAGNOSTIC FINDINGS:   PATIENT SURVEYS:  Eval:  FOTO 39, goal 36  COGNITION: Overall cognitive status: Within functional limits for tasks assessed     SENSATION: Not tested   POSTURE: weight shift left  PALPATION: Scar adhesions noted Rt surgical incision   LOWER EXTREMITY ROM: Eval: Right knee flexion 18-75 Left knee is [facility]  02/09/2022: Right knee flexion 91 degrees in supine  02/11/2022: Supine A/ROM Right knee:  0 - 93 deg Seated A/ROM Right knee:  5 - 82 deg Seated left knee A/ROM 0-123 deg  02/13/2022: Seated A/ROM Right knee:  5 - 95 deg  02/16/22: Knee flexion: 106 in supine  LOWER EXTREMITY MMT:  MMT Right eval Left eval  Hip flexion 3 5   Hip extension    Hip abduction 3+   Hip adduction    Hip internal rotation    Hip external rotation    Knee flexion    Knee extension    Ankle dorsiflexion    Ankle plantarflexion    Ankle inversion    Ankle eversion     (Blank rows = not  tested)  LOWER EXTREMITY SPECIAL TESTS:    FUNCTIONAL TESTS:  Eval: 5 times sit to stand: 13 sec, 2 HHA, weight shifted Lt Timed up and go (TUG): 16 sec, using RW  02/18/2022: 3 minute walk test:  341 ft with RW  GAIT: Distance walked: through clinic to treatment room and gym Assistive device utilized: Environmental consultant - 2 wheeled Level of assistance: Modified independence Comments: Rt LE decreased knee flexion/hip extension during swing phase and minimal PF noted during pushoff   TODAY'S TREATMENT:     02/20/22: Nustep L5 new model 8 min with PTA present GAIT: Standard cane around clinic 1x, no significant cues needed Sit to stand with 1 pad on mat table no UE 2x5 Leg press Seat 8: Bil 55# 2x10, RTLE 20# 10x2 Vc to contract quad. LAQ 2# x10 3 sec hold Game Ready to right knee:  3 snowflakes, medium compression, x10 min   DATE: 02/18/2022 Nustep level 4 (seat at 8 on green machine) x6 minutes with PT present to discuss status 3 min amb with RW for cuing for improved gait pattern with increased weight through RLE Seated:  marching, LAQ, knee flexion stretch with foot on slider.  RLE 2x10 Seated right knee AA/ROM: to 0-107 degrees Standing with RW:  heel raises, marching, hamstring curls.  BLE 2x10 each Leg Press (seat at 8) 50# 2x10 Game Ready to right knee:  3 snowflakes, medium compression, x10 min   02/16/22:      Nustep old model: L1 8 min with PTA present to discuss current status.    Manual: Pt sitting off the hi lo table position in some knee flexion: Scar mobs, patella mobs, passive knee flexion stretching, edema massage LAQ: 0# 3x5 CREEP theory knee flexion stretching: educated pt how to do this at home 2x day. AROM in supine 106 degrees F   Game Ready to right knee:  3 snowflakes, medium compression, x10 min  PATIENT EDUCATION:  Education details: eval  findings/POC; importance of gaining knee extension for pain/swelling/mobility; HEP Person educated: Patient Education method: Explanation, Verbal cues, and Handouts Education comprehension: verbalized understanding and returned demonstration  HOME EXERCISE PROGRAM: Access Code: [email or web address] URL: [email or web address] Date: 02/13/2022 Prepared by: Shelby Dubin Menke  Exercises - Seated Passive Knee Extension  - 3 x daily - 7 x weekly - up to 1 minute hold - Seated Knee Flexion AAROM  - 3 x daily - 7 x weekly - 30 seconds hold - Seated Long Arc Quad  - 3 x daily - 7 x weekly - 1 sets - 10 reps - 5 hold - Standing Marching  - 1-2 x daily - 7 x weekly - 1 sets - 10 reps - Standing Knee Flexion AROM with Chair Support  - 1 x daily - 7 x weekly - 2 sets - 10 reps - Long Sitting Quad Set with Towel Roll Under Heel  - 1 x daily - 7 x weekly - 3 sets - 10 reps - Active Straight Leg Raise with Quad Set  - 1 x daily - 7 x weekly - 2 sets - 10 reps - Supine Heel Slide with Strap  - 1 x daily - 7 x weekly - 2 sets - 10 reps - Supine Short Arc Quad  - 1 x daily - 7 x weekly - 2 sets - 10 reps  Patient Education - Scar Massage  ASSESSMENT:  CLINICAL IMPRESSION: Pt able to ambulate with standard cane today in the clinic > 265f with good technique/no compensations. Pt progressing her strength and ROM. AROM remains at 107.   OBJECTIVE IMPAIRMENTS: Abnormal gait, decreased activity tolerance, decreased balance, decreased endurance, decreased mobility, difficulty walking, decreased ROM, decreased strength, hypomobility, increased edema, increased fascial restrictions, impaired flexibility, postural dysfunction, and pain.   ACTIVITY LIMITATIONS: standing, squatting, sleeping, stairs, transfers, bed mobility, bathing, dressing, hygiene/grooming, and locomotion level  PARTICIPATION LIMITATIONS: cleaning, laundry, driving, shopping, community activity, and yard work  PERSONAL FACTORS: Age, Fitness,  Past/current experiences, Time since onset of injury/illness/exacerbation, and 3+ comorbidities: Rt THA, Lt TKA, DM, anxiety  are also affecting patient's functional outcome.   REHAB POTENTIAL: Good  CLINICAL DECISION MAKING: Evolving/moderate complexity  EVALUATION COMPLEXITY: Moderate   GOALS: Goals reviewed with patient? Yes  SHORT TERM GOALS: Target date: 02/18/22 Pt will be independent with initial HEP to improve ROM and mobility. Baseline: Goal status: Goal met 02/09/21  2.  Pt will have atleast 90 deg of Rt knee flexion Baseline:  Goal status:Goal met 02/09/21  3.  Pt will have Rt knee extension to 0 deg.  Baseline:  Goal status: MET on 02/18/2022    LONG TERM GOALS: Target date: 04/01/22  Pt will have 120 deg Rt knee flexion and 0 deg Rt knee extension Baseline: 75 deg flexion, 18 deg ext Goal status: IN PROGRESS  2.  Pt will report atleast 50% improvement in Rt knee pain with ADLs. Baseline:  Goal status: INITIAL  3.  Pt will be able to ambulate with LRAD atleast 1011fwith minimal compensations in her gait pattern.  Baseline:  Goal status: INITIAL  4.  Pt will be able to ascend and descend 1 flight of stairs with/without handrails and step over pattern to allow her to get upstairs to her shower. Baseline:  Goal status: INITIAL  5.  Pt will have 5/5 Rt hip/knee strength to improver her mechanics and efficiency with daily activity.  Baseline:  Goal status: INITIAL  PLAN:  PT FREQUENCY:  3x/week for 4 weeks, then decrease to 1-2x/week for 4 weeks   PT DURATION: 8 weeks  PLANNED INTERVENTIONS: Therapeutic exercises, Therapeutic activity, Neuromuscular re-education, Balance training, Gait training, Patient/Family education, Self Care, Joint mobilization, Cryotherapy, Manual therapy, and Re-evaluation  PLAN FOR NEXT SESSION: Continue to work on knee flexion, swelling, normalized gait with cane, Nustep, quad strength   Myrene Galas, PTA 02/20/22 11:01  AM    [place] [address], [place] [address] Phone # [phone] Fax [phone]

## 2022-02-23 ENCOUNTER — Ambulatory Visit: Payer: 59 | Admitting: Physical Therapy

## 2022-02-23 ENCOUNTER — Ambulatory Visit: Payer: 59

## 2022-02-23 ENCOUNTER — Encounter: Payer: Self-pay | Admitting: Physical Therapy

## 2022-02-23 VITALS — Wt 181.5 lb

## 2022-02-23 DIAGNOSIS — M25561 Pain in right knee: Secondary | ICD-10-CM

## 2022-02-23 DIAGNOSIS — M6281 Muscle weakness (generalized): Secondary | ICD-10-CM

## 2022-02-23 DIAGNOSIS — Z483 Aftercare following surgery for neoplasm: Secondary | ICD-10-CM

## 2022-02-23 DIAGNOSIS — R600 Localized edema: Secondary | ICD-10-CM

## 2022-02-23 DIAGNOSIS — M25661 Stiffness of right knee, not elsewhere classified: Secondary | ICD-10-CM

## 2022-02-23 DIAGNOSIS — R2681 Unsteadiness on feet: Secondary | ICD-10-CM

## 2022-02-23 NOTE — Therapy (Signed)
OUTPATIENT PHYSICAL THERAPY SOZO SCREENING NOTE   Patient Name: Sierra Perez MRN: [id number] DOB:August 19, 1960, 62 y.o., female Today's Date: 02/23/2022  PCP: Wenda Low, MD REFERRING PROVIDER: Nicholas Lose, MD   PT End of Session - 02/23/22 [phone]     Visit Number 7   # unchnaged due to screen only   PT Start Time 0802    PT Stop Time 0808    PT Time Calculation (min) 6 min    Activity Tolerance Patient tolerated treatment well    Behavior During Therapy [facility] for tasks assessed/performed             Past Medical History:  Diagnosis Date   Anemia    Anxiety    Arthritis    Asthma    Breast cancer ([place]) 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, her-2 positive, ER/PR negative   Clotting disorder ([place])    testing was negative, h/o DVT while on treatment   Colon polyps    Complex regional pain syndrome i of right lower limb    RDS   Complication of anesthesia    Cystitis    Diabetes mellitus without complication ([place])    DVT (deep venous thrombosis) ([place])    started in 2015, multiple   Family history of breast cancer    Family history of colon cancer    Fibroid    Fibromyalgia    GERD (gastroesophageal reflux disease)    Heart murmur    HLD (hyperlipidemia)    HPV in female    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   Hypertension    Hypothyroidism    IBS (irritable bowel syndrome)    Mitral valve prolapse    Peptic ulcer    Personal history of malignant neoplasm of breast    Sleep apnea treated with continuous positive airway pressure (CPAP)    Past Surgical History:  Procedure Laterality Date   ABDOMINAL HYSTERECTOMY  2010   fibroids, h/o abnormal pap smears   AXILLARY SENTINEL NODE BIOPSY Right 07/08/2020   Procedure: RIGHT AXILLARY SENTINEL NODE BIOPSY;  Surgeon: Rolm Bookbinder, MD;  Location: [place];  Service: General;  Laterality: Right;   BREAST RECONSTRUCTION WITH PLACEMENT OF TISSUE EXPANDER AND FLEX HD (ACELLULAR HYDRATED DERMIS) Bilateral  07/08/2020   Procedure: BILATERAL BREAST RECONSTRUCTION WITH  FLEX HD (ACELLULAR HYDRATED DERMIS),DIRECT IMPLANT RECONSTRUCTION;  Surgeon: Cindra Presume, MD;  Location: [place];  Service: Plastics;  Laterality: Bilateral;   BREAST SURGERY Right 2015   right lumpectomy with reduction and lift   COLONOSCOPY     DENTAL SURGERY     ESOPHAGOGASTRODUODENOSCOPY     LAPAROSCOPIC GASTRIC SLEEVE RESECTION  2014   LAPAROSCOPIC OOPHERECTOMY Bilateral 2016   REPLACEMENT TOTAL KNEE Left 2018   also had 3 prior arthroscopies   TOE SURGERY Right    dislocated toe   TOTAL HIP ARTHROPLASTY Right 11/19/2020   Procedure: RIGHT TOTAL HIP ARTHROPLASTY ANTERIOR APPROACH;  Surgeon: Mcarthur Rossetti, MD;  Location: [place];  Service: Orthopedics;  Laterality: Right;   TOTAL KNEE ARTHROPLASTY Right 01/09/2022   Procedure: RIGHT TOTAL KNEE ARTHROPLASTY;  Surgeon: Mcarthur Rossetti, MD;  [facility];  Service: Orthopedics;  Laterality: Right;   TOTAL MASTECTOMY Bilateral 07/08/2020   Procedure: BILATERAL TOTAL MASTECTOMY;  Surgeon: Rolm Bookbinder, MD;  Location: [place];  Service: General;  Laterality: Bilateral;   VENA CAVA FILTER PLACEMENT  2017   Patient Active Problem List   Diagnosis Date Noted  Status post total right knee replacement 01/09/2022   Obesity (BMI 30-39.9) 06/23/2021   Iron deficiency anemia 01/23/2021   Status post right hip replacement 12/03/2020   Status post total replacement of right hip 11/19/2020   Breast cancer ([place]) 07/08/2020   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   H/O total hysterectomy 06/11/2020   Genetic testing 05/27/2020   Chronic superficial gastritis 05/27/2020   Fibromyalgia 05/27/2020   GERD (gastroesophageal reflux disease) 05/27/2020   Prediabetes 05/27/2020   Complex regional pain syndrome I, unspecified 05/27/2020   Atypical lobular hyperplasia [facility]) of left breast 05/23/2020   History of therapeutic radiation 05/23/2020   Family history of  breast cancer    Family history of colon cancer    Personal history of malignant neoplasm of breast    Unilateral primary osteoarthritis, right knee 02/26/2020   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   DVT (deep venous thrombosis) ([place]) 05/04/2018   Malignant neoplasm of upper-outer quadrant of right breast in female, estrogen receptor negative ([place]) 05/03/2018   Obstructive sleep apnea (adult) (pediatric) 09/14/2017   Adenopathy 07/15/2017   B12 deficiency 07/15/2017   Disorder of tympanic membrane of right ear 07/15/2017   Recurrent UTI 03/09/2017   Bladder pain 02/25/2017   Proteinuria 02/25/2017   Hematoma of thigh, left, subsequent encounter 08/16/2016   At risk for obstructive sleep apnea 07/01/2016   Suspected sleep apnea 07/01/2016   Arthritis of left knee 05/26/2016   Localized primary osteoarthritis of lower leg, left 05/26/2016   Clotting disorder ([place]) 05/22/2016   Chronic anticoagulation 01/06/2016   Right-sided epistaxis 01/06/2016   Urinary tract infection, site not specified 08/12/2015   Abnormal Pap smear of vagina 05/06/2015   Follow-up examination, following other surgery 12/14/2013   Pelvic and perineal pain 11/28/2013   Anxiety and depression 11/16/2013   H/O bariatric surgery 11/16/2013   Atrophic vaginitis 11/13/2013   Tear of lateral cartilage or meniscus of knee, current 04/14/2013   Allergic rhinitis 01/16/2013   Female genital symptoms 11/10/2012   Symptomatic menopausal or female climacteric states 11/10/2012   Disequilibrium 06/09/2012   Morbid obesity ([place]) 02/09/2012   Vitamin D deficiency 09/13/2008   Preop examination 04/20/2008   Hypothyroidism 05/20/2007    REFERRING DIAG: right breast cancer at risk for lymphedema  THERAPY DIAG: Aftercare following surgery for neoplasm  PERTINENT HISTORY: 2014 Rt breast cancer with lumpectomy with SLNB, radiation, and chemotherapy. Recurrence on the Rt with bil mastectomy with SLNB on 07/08/20 due to grade 2 ILC  ER/PR positive.  Other history includes clotting disorder and DVT history. Had a SOZO score increased of 14.3 on 03/13/21 which decreased back to normal in 04/10/21.   PRECAUTIONS: right UE Lymphedema risk, None  SUBJECTIVE: Pt returns for her 3 month L-Dex screen.   PAIN:  Are you having pain? No  SOZO SCREENING: Patient was assessed today using the SOZO machine to determine the lymphedema index score. This was compared to her baseline score. It was determined that she is within the recommended range when compared to her baseline and no further action is needed at this time. She will continue SOZO screenings. These are done every 3 months for 2 years post operatively followed by every 6 months for 2 years, and then annually.   L-DEX FLOWSHEETS - 02/23/22 0800       L-DEX LYMPHEDEMA SCREENING   Measurement Type Unilateral    L-DEX MEASUREMENT EXTREMITY Upper Extremity    POSITION  Standing    DOMINANT SIDE Left  At Risk Side Right    BASELINE SCORE (UNILATERAL) 5.9    L-DEX SCORE (UNILATERAL) 4.1    VALUE CHANGE (UNILAT) -1.8               Otelia Limes, PTA 02/23/2022, 8:08 AM

## 2022-02-23 NOTE — Therapy (Addendum)
OUTPATIENT PHYSICAL THERAPY TREATMENT NOTE   Patient Name: Sierra Perez MRN: [id number] DOB:March 08, 1960, 62 y.o., female Today's Date: 02/23/2022  END OF SESSION:  PT End of Session - 02/23/22 0844     Visit Number 8    Date for PT Re-Evaluation 04/01/22    Authorization Type [employer]    Authorization Time Period 02/04/22 to 04/01/22    Authorization - Visit Number 8    Authorization - Number of Visits 23    Progress Note Due on Visit 10    PT Start Time 4008    PT Stop Time 0945    PT Time Calculation (min) 61 min    Activity Tolerance Patient tolerated treatment well    Behavior During Therapy WFL for tasks assessed/performed               Past Medical History:  Diagnosis Date   Anemia    Anxiety    Arthritis    Asthma    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, her-2 positive, ER/PR negative   Clotting disorder ([place])    testing was negative, h/o DVT while on treatment   Colon polyps    Complex regional pain syndrome i of right lower limb    RDS   Complication of anesthesia    Cystitis    Diabetes mellitus without complication ([place])    DVT (deep venous thrombosis) ([place])    started in 2015, multiple   Family history of breast cancer    Family history of colon cancer    Fibroid    Fibromyalgia    GERD (gastroesophageal reflux disease)    Heart murmur    HLD (hyperlipidemia)    HPV in female    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   Hypertension    Hypothyroidism    IBS (irritable bowel syndrome)    Mitral valve prolapse    Peptic ulcer    Personal history of malignant neoplasm of breast    Sleep apnea treated with continuous positive airway pressure (CPAP)    Past Surgical History:  Procedure Laterality Date   ABDOMINAL HYSTERECTOMY  2010   fibroids, h/o abnormal pap smears   AXILLARY SENTINEL NODE BIOPSY Right 07/08/2020   Procedure: RIGHT AXILLARY SENTINEL NODE BIOPSY;  Surgeon: Rolm Bookbinder, MD;  Location: [facility];  Service: General;  Laterality: Right;   BREAST RECONSTRUCTION WITH PLACEMENT OF TISSUE EXPANDER AND FLEX HD (ACELLULAR HYDRATED DERMIS) Bilateral 07/08/2020   Procedure: BILATERAL BREAST RECONSTRUCTION WITH  FLEX HD (ACELLULAR HYDRATED DERMIS),DIRECT IMPLANT RECONSTRUCTION;  Surgeon: Cindra Presume, MD;  Location: [facility];  Service: Plastics;  Laterality: Bilateral;   BREAST SURGERY Right 2015   right lumpectomy with reduction and lift   COLONOSCOPY     DENTAL SURGERY     ESOPHAGOGASTRODUODENOSCOPY     LAPAROSCOPIC GASTRIC SLEEVE RESECTION  2014   LAPAROSCOPIC OOPHERECTOMY Bilateral 2016   REPLACEMENT TOTAL KNEE Left 2018   also had 3 prior arthroscopies   TOE SURGERY Right    dislocated toe   TOTAL HIP ARTHROPLASTY Right 11/19/2020   Procedure: RIGHT TOTAL HIP ARTHROPLASTY ANTERIOR APPROACH;  Surgeon: Mcarthur Rossetti, MD;  Location: [facility];  Service: Orthopedics;  Laterality: Right;   TOTAL KNEE ARTHROPLASTY Right 01/09/2022   Procedure: RIGHT TOTAL KNEE ARTHROPLASTY;  Surgeon: Mcarthur Rossetti, MD;  Location: [facility];  Service: Orthopedics;  Laterality: Right;   TOTAL MASTECTOMY Bilateral 07/08/2020   Procedure: BILATERAL TOTAL  MASTECTOMY;  Surgeon: Rolm Bookbinder, MD;  Location: [facility];  Service: General;  Laterality: Bilateral;   VENA CAVA FILTER PLACEMENT  2017   Patient Active Problem List   Diagnosis Date Noted   Status post total right knee replacement 01/09/2022   Obesity (BMI 30-39.9) 06/23/2021   Iron deficiency anemia 01/23/2021   Status post right hip replacement 12/03/2020   Status post total replacement of right hip 11/19/2020   Breast cancer ([place]) 07/08/2020   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   H/O total hysterectomy 06/11/2020   Genetic testing 05/27/2020   Chronic superficial gastritis 05/27/2020   Fibromyalgia 05/27/2020   GERD (gastroesophageal reflux disease) 05/27/2020   Prediabetes 05/27/2020   Complex regional pain syndrome I,  unspecified 05/27/2020   Atypical lobular hyperplasia [facility]) of left breast 05/23/2020   History of therapeutic radiation 05/23/2020   Family history of breast cancer    Family history of colon cancer    Personal history of malignant neoplasm of breast    Unilateral primary osteoarthritis, right knee 02/26/2020   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   DVT (deep venous thrombosis) ([place]) 05/04/2018   Malignant neoplasm of upper-outer quadrant of right breast in female, estrogen receptor negative ([place]) 05/03/2018   Obstructive sleep apnea (adult) (pediatric) 09/14/2017   Adenopathy 07/15/2017   B12 deficiency 07/15/2017   Disorder of tympanic membrane of right ear 07/15/2017   Recurrent UTI 03/09/2017   Bladder pain 02/25/2017   Proteinuria 02/25/2017   Hematoma of thigh, left, subsequent encounter 08/16/2016   At risk for obstructive sleep apnea 07/01/2016   Suspected sleep apnea 07/01/2016   Arthritis of left knee 05/26/2016   Localized primary osteoarthritis of lower leg, left 05/26/2016   Clotting disorder ([place]) 05/22/2016   Chronic anticoagulation 01/06/2016   Right-sided epistaxis 01/06/2016   Urinary tract infection, site not specified 08/12/2015   Abnormal Pap smear of vagina 05/06/2015   Follow-up examination, following other surgery 12/14/2013   Pelvic and perineal pain 11/28/2013   Anxiety and depression 11/16/2013   H/O bariatric surgery 11/16/2013   Atrophic vaginitis 11/13/2013   Tear of lateral cartilage or meniscus of knee, current 04/14/2013   Allergic rhinitis 01/16/2013   Female genital symptoms 11/10/2012   Symptomatic menopausal or female climacteric states 11/10/2012   Disequilibrium 06/09/2012   Morbid obesity ([place]) 02/09/2012   Vitamin D deficiency 09/13/2008   Preop examination 04/20/2008   Hypothyroidism 05/20/2007    PCP: Wenda Low, MD  REFERRING PROVIDER: Erskine Emery, PA-C  REFERRING DIAG: s/p Rt TKA  THERAPY DIAG:  Aftercare following surgery for  neoplasm  Acute pain of right knee  Stiffness of right knee, not elsewhere classified  Localized edema  Unsteadiness on feet  Muscle weakness (generalized)  Rationale for Evaluation and Treatment: Rehabilitation  ONSET DATE: 01/09/22  SUBJECTIVE:   SUBJECTIVE STATEMENT: I went to the gym over the weekend and did the recumbent bike and a little leg press, I am walking with my cane now.   PERTINENT HISTORY: Rt THA 2022, Rt TKA 01/09/22, Lt TKA 2018, Hx of Breast Cancer s/p double mastectomy with sentinel lymph node removed from right side  PAIN:  Are you having pain? Yes Reports 2-3/10 pain in right knee  PRECAUTIONS: Knee, s/p bilateral mastectomy  WEIGHT BEARING RESTRICTIONS: No  FALLS:  Has patient fallen in last 6 months? No  LIVING ENVIRONMENT: Lives with: lives alone Lives in: House/apartment Stairs: Yes: Internal: 1 flight steps; handrails both sides Has following equipment at home: walker and  cane  OCCUPATION:   PLOF: Independent  PATIENT GOALS: be able to get upstairs to shower   NEXT MD VISIT: 03/02/22  OBJECTIVE:   DIAGNOSTIC FINDINGS:   PATIENT SURVEYS:  Eval:  FOTO 39, goal 44  COGNITION: Overall cognitive status: Within functional limits for tasks assessed     SENSATION: Not tested   POSTURE: weight shift left  PALPATION: Scar adhesions noted Rt surgical incision   LOWER EXTREMITY ROM: Eval: Right knee flexion 18-75 Left knee is [facility]  02/09/2022: Right knee flexion 91 degrees in supine  02/11/2022: Supine A/ROM Right knee:  0 - 93 deg Seated A/ROM Right knee:  5 - 82 deg Seated left knee A/ROM 0-123 deg  02/13/2022: Seated A/ROM Right knee:  5 - 95 deg  02/16/22: Knee flexion: 106 in supine  02/23/22: Knee flexion: 110 degrees F supine  LOWER EXTREMITY MMT:  MMT Right eval Left eval  Hip flexion 3 5   Hip extension    Hip abduction 3+   Hip adduction    Hip internal rotation    Hip external rotation    Knee flexion     Knee extension    Ankle dorsiflexion    Ankle plantarflexion    Ankle inversion    Ankle eversion     (Blank rows = not tested)  LOWER EXTREMITY SPECIAL TESTS:    FUNCTIONAL TESTS:  Eval: 5 times sit to stand: 13 sec, 2 HHA, weight shifted Lt Timed up and go (TUG): 16 sec, using RW  02/18/2022: 3 minute walk test:  341 ft with RW  GAIT: Distance walked: through clinic to treatment room and gym Assistive device utilized: Environmental consultant - 2 wheeled Level of assistance: Modified independence Comments: Rt LE decreased knee flexion/hip extension during swing phase and minimal PF noted during pushoff   TODAY'S TREATMENT:     02/03/22: Nustep Old model L3 8 mi with PTA present to discuss status. Sit to stand 1 pad on mat table 10x, no extra pad from mat table 10x LAQ 3# 3 sec old 2x10 Leg press (7) 65# Bil 10x, RTLE 25# 2x10   Forward step ups on stairs 2x10 with light UE on rails Standing hip abduction 0# 2x10  Knee flexion stretching in supine utilizing the Creep theory.  Game Ready to right knee:  3 snowflakes, medium compression, x15 min Knee flexion measurement: 110  02/20/22: Nustep L5 new model 8 min with PTA present GAIT: Standard cane around clinic 1x, no significant cues needed Sit to stand with 1 pad on mat table no UE 2x5 Leg press Seat 8: Bil 55# 2x10, RTLE 20# 10x2 Vc to contract quad. LAQ 2# x10 3 sec hold Game Ready to right knee:  3 snowflakes, medium compression, x10 min   DATE: 02/18/2022 Nustep level 4 (seat at 8 on green machine) x6 minutes with PT present to discuss status 3 min amb with RW for cuing for improved gait pattern with increased weight through RLE Seated:  marching, LAQ, knee flexion stretch with foot on slider.  RLE 2x10 Seated right knee AA/ROM: to 0-107 degrees Standing with RW:  heel raises, marching, hamstring curls.  BLE 2x10 each Leg Press (seat at 8) 50# 2x10 Game Ready to right knee:  3 snowflakes, medium compression, x10 min   PATIENT  EDUCATION:  Education details: eval findings/POC; importance of gaining knee extension for pain/swelling/mobility; HEP Person educated: Patient Education method: Explanation, Verbal cues, and Handouts Education comprehension: verbalized understanding and returned demonstration  HOME  EXERCISE PROGRAM: Access Code: [email or web address] URL: [email or web address] Date: 02/13/2022 Prepared by: Shelby Dubin Menke  Exercises - Seated Passive Knee Extension  - 3 x daily - 7 x weekly - up to 1 minute hold - Seated Knee Flexion AAROM  - 3 x daily - 7 x weekly - 30 seconds hold - Seated Long Arc Quad  - 3 x daily - 7 x weekly - 1 sets - 10 reps - 5 hold - Standing Marching  - 1-2 x daily - 7 x weekly - 1 sets - 10 reps - Standing Knee Flexion AROM with Chair Support  - 1 x daily - 7 x weekly - 2 sets - 10 reps - Long Sitting Quad Set with Towel Roll Under Heel  - 1 x daily - 7 x weekly - 3 sets - 10 reps - Active Straight Leg Raise with Quad Set  - 1 x daily - 7 x weekly - 2 sets - 10 reps - Supine Heel Slide with Strap  - 1 x daily - 7 x weekly - 2 sets - 10 reps - Supine Short Arc Quad  - 1 x daily - 7 x weekly - 2 sets - 10 reps  Patient Education - Scar Massage  ASSESSMENT:  CLINICAL IMPRESSION: Pt arrives with little knee pain. Pt was able to go to the gym over the weekend and ride the bike and did a little leg press all with positive outcomes. Pt was able to increase weight on leg press, add step ups and progress her knee flexion today. Edema continues to reduce.   OBJECTIVE IMPAIRMENTS: Abnormal gait, decreased activity tolerance, decreased balance, decreased endurance, decreased mobility, difficulty walking, decreased ROM, decreased strength, hypomobility, increased edema, increased fascial restrictions, impaired flexibility, postural dysfunction, and pain.   ACTIVITY LIMITATIONS: standing, squatting, sleeping, stairs, transfers, bed mobility, bathing, dressing, hygiene/grooming, and  locomotion level  PARTICIPATION LIMITATIONS: cleaning, laundry, driving, shopping, community activity, and yard work  PERSONAL FACTORS: Age, Fitness, Past/current experiences, Time since onset of injury/illness/exacerbation, and 3+ comorbidities: Rt THA, Lt TKA, DM, anxiety  are also affecting patient's functional outcome.   REHAB POTENTIAL: Good  CLINICAL DECISION MAKING: Evolving/moderate complexity  EVALUATION COMPLEXITY: Moderate   GOALS: Goals reviewed with patient? Yes  SHORT TERM GOALS: Target date: 02/18/22 Pt will be independent with initial HEP to improve ROM and mobility. Baseline: Goal status: Goal met 02/09/21  2.  Pt will have atleast 90 deg of Rt knee flexion Baseline:  Goal status:Goal met 02/09/21  3.  Pt will have Rt knee extension to 0 deg.  Baseline:  Goal status: MET on 02/18/2022    LONG TERM GOALS: Target date: 04/01/22  Pt will have 120 deg Rt knee flexion and 0 deg Rt knee extension Baseline: 75 deg flexion, 18 deg ext Goal status: IN PROGRESS  2.  Pt will report atleast 50% improvement in Rt knee pain with ADLs. Baseline:  Goal status: INITIAL  3.  Pt will be able to ambulate with LRAD atleast 154f with minimal compensations in her gait pattern.  Baseline:  Goal status: INITIAL  4.  Pt will be able to ascend and descend 1 flight of stairs with/without handrails and step over pattern to allow her to get upstairs to her shower. Baseline:  Goal status: INITIAL  5.  Pt will have 5/5 Rt hip/knee strength to improver her mechanics and efficiency with daily activity.  Baseline:  Goal status: INITIAL    PLAN:  PT FREQUENCY:  3x/week for 4  weeks, then decrease to 1-2x/week for 4 weeks   PT DURATION: 8 weeks  PLANNED INTERVENTIONS: Therapeutic exercises, Therapeutic activity, Neuromuscular re-education, Balance training, Gait training, Patient/Family education, Self Care, Joint mobilization, Cryotherapy, Manual therapy, and Re-evaluation  PLAN  FOR NEXT SESSION: Update HEP next session   Myrene Galas, PTA 02/23/22 9:33 AM    [facility] [address], [place], [place] [address] Phone # [phone] Fax [phone]

## 2022-02-25 ENCOUNTER — Encounter: Payer: Self-pay | Admitting: Rehabilitative and Restorative Service Providers"

## 2022-02-25 ENCOUNTER — Ambulatory Visit: Payer: 59 | Admitting: Rehabilitative and Restorative Service Providers"

## 2022-02-25 DIAGNOSIS — M25561 Pain in right knee: Secondary | ICD-10-CM

## 2022-02-25 DIAGNOSIS — R600 Localized edema: Secondary | ICD-10-CM

## 2022-02-25 DIAGNOSIS — R2681 Unsteadiness on feet: Secondary | ICD-10-CM

## 2022-02-25 DIAGNOSIS — M25661 Stiffness of right knee, not elsewhere classified: Secondary | ICD-10-CM

## 2022-02-25 NOTE — Therapy (Signed)
OUTPATIENT PHYSICAL THERAPY TREATMENT NOTE   Patient Name: Sierra Perez MRN: [id number] DOB:[birth year], 62 y.o., female Today's Date: 02/25/2022  END OF SESSION:  PT End of Session - 02/25/22 0935     Visit Number 9    Date for PT Re-Evaluation 04/01/22    Authorization Type [employer]    Authorization Time Period 02/04/22 to 04/01/22    Authorization - Visit Number 9    Authorization - Number of Visits 23    PT Start Time 0930    PT Stop Time 1020    PT Time Calculation (min) 50 min    Activity Tolerance Patient tolerated treatment well    Behavior During Therapy WFL for tasks assessed/performed               Past Medical History:  Diagnosis Date   Anemia    Anxiety    Arthritis    Asthma    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, her-2 positive, ER/PR negative   Clotting disorder ([place])    testing was negative, h/o DVT while on treatment   Colon polyps    Complex regional pain syndrome i of right lower limb    RDS   Complication of anesthesia    Cystitis    Diabetes mellitus without complication ([place])    DVT (deep venous thrombosis) ([place])    started in 2015, multiple   Family history of breast cancer    Family history of colon cancer    Fibroid    Fibromyalgia    GERD (gastroesophageal reflux disease)    Heart murmur    HLD (hyperlipidemia)    HPV in female    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   Hypertension    Hypothyroidism    IBS (irritable bowel syndrome)    Mitral valve prolapse    Peptic ulcer    Personal history of malignant neoplasm of breast    Sleep apnea treated with continuous positive airway pressure (CPAP)    Past Surgical History:  Procedure Laterality Date   ABDOMINAL HYSTERECTOMY  2010   fibroids, h/o abnormal pap smears   AXILLARY SENTINEL NODE BIOPSY Right 07/08/2020   Procedure: RIGHT AXILLARY SENTINEL NODE BIOPSY;  Surgeon: Rolm Bookbinder, MD;  Location: [place];  Service: General;   Laterality: Right;   BREAST RECONSTRUCTION WITH PLACEMENT OF TISSUE EXPANDER AND FLEX HD (ACELLULAR HYDRATED DERMIS) Bilateral 07/08/2020   Procedure: BILATERAL BREAST RECONSTRUCTION WITH  FLEX HD (ACELLULAR HYDRATED DERMIS),DIRECT IMPLANT RECONSTRUCTION;  Surgeon: Cindra Presume, MD;  Location: [place];  Service: Plastics;  Laterality: Bilateral;   BREAST SURGERY Right 2015   right lumpectomy with reduction and lift   COLONOSCOPY     DENTAL SURGERY     ESOPHAGOGASTRODUODENOSCOPY     LAPAROSCOPIC GASTRIC SLEEVE RESECTION  2014   LAPAROSCOPIC OOPHERECTOMY Bilateral 2016   REPLACEMENT TOTAL KNEE Left 2018   also had 3 prior arthroscopies   TOE SURGERY Right    dislocated toe   TOTAL HIP ARTHROPLASTY Right 11/19/2020   Procedure: RIGHT TOTAL HIP ARTHROPLASTY ANTERIOR APPROACH;  Surgeon: Mcarthur Rossetti, MD;  Location: [place];  Service: Orthopedics;  Laterality: Right;   TOTAL KNEE ARTHROPLASTY Right 01/09/2022   Procedure: RIGHT TOTAL KNEE ARTHROPLASTY;  Surgeon: Mcarthur Rossetti, MD;  Location: [facility];  Service: Orthopedics;  Laterality: Right;   TOTAL MASTECTOMY Bilateral 07/08/2020   Procedure: BILATERAL TOTAL MASTECTOMY;  Surgeon: Rolm Bookbinder, MD;  Location: [facility]  OR;  Service: General;  Laterality: Bilateral;   VENA CAVA FILTER PLACEMENT  2017   Patient Active Problem List   Diagnosis Date Noted   Status post total right knee replacement 01/09/2022   Obesity (BMI 30-39.9) 06/23/2021   Iron deficiency anemia 01/23/2021   Status post right hip replacement 12/03/2020   Status post total replacement of right hip 11/19/2020   Breast cancer ([place]) 07/08/2020   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   H/O total hysterectomy 06/11/2020   Genetic testing 05/27/2020   Chronic superficial gastritis 05/27/2020   Fibromyalgia 05/27/2020   GERD (gastroesophageal reflux disease) 05/27/2020   Prediabetes 05/27/2020   Complex regional pain syndrome I, unspecified 05/27/2020    Atypical lobular hyperplasia [facility]) of left breast 05/23/2020   History of therapeutic radiation 05/23/2020   Family history of breast cancer    Family history of colon cancer    Personal history of malignant neoplasm of breast    Unilateral primary osteoarthritis, right knee 02/26/2020   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   DVT (deep venous thrombosis) ([place]) 05/04/2018   Malignant neoplasm of upper-outer quadrant of right breast in female, estrogen receptor negative ([place]) 05/03/2018   Obstructive sleep apnea (adult) (pediatric) 09/14/2017   Adenopathy 07/15/2017   B12 deficiency 07/15/2017   Disorder of tympanic membrane of right ear 07/15/2017   Recurrent UTI 03/09/2017   Bladder pain 02/25/2017   Proteinuria 02/25/2017   Hematoma of thigh, left, subsequent encounter 08/16/2016   At risk for obstructive sleep apnea 07/01/2016   Suspected sleep apnea 07/01/2016   Arthritis of left knee 05/26/2016   Localized primary osteoarthritis of lower leg, left 05/26/2016   Clotting disorder ([place]) 05/22/2016   Chronic anticoagulation 01/06/2016   Right-sided epistaxis 01/06/2016   Urinary tract infection, site not specified 08/12/2015   Abnormal Pap smear of vagina 05/06/2015   Follow-up examination, following other surgery 12/14/2013   Pelvic and perineal pain 11/28/2013   Anxiety and depression 11/16/2013   H/O bariatric surgery 11/16/2013   Atrophic vaginitis 11/13/2013   Tear of lateral cartilage or meniscus of knee, current 04/14/2013   Allergic rhinitis 01/16/2013   Female genital symptoms 11/10/2012   Symptomatic menopausal or female climacteric states 11/10/2012   Disequilibrium 06/09/2012   Morbid obesity ([place]) 02/09/2012   Vitamin D deficiency 09/13/2008   Preop examination 04/20/2008   Hypothyroidism 05/20/2007    PCP: Wenda Low, MD  REFERRING PROVIDER: Erskine Emery, PA-C  REFERRING DIAG: s/p Rt TKA  THERAPY DIAG:  Acute pain of right knee  Stiffness of right knee, not  elsewhere classified  Localized edema  Unsteadiness on feet  Rationale for Evaluation and Treatment: Rehabilitation  ONSET DATE: 01/09/22  SUBJECTIVE:   SUBJECTIVE STATEMENT:  Pt reports that she forgot to take her medication before bed last night and she did not sleep well. Pt states pain currently 3-4/10, but the pain gets worse as the day progresses.  PERTINENT HISTORY: Rt THA 2022, Rt TKA 01/09/22, Lt TKA 2018, Hx of Breast Cancer s/p double mastectomy with sentinel lymph node removed from right side  PAIN:  Are you having pain? Yes Reports 3-4/10 pain in right knee  PRECAUTIONS: Knee, s/p bilateral mastectomy  WEIGHT BEARING RESTRICTIONS: No  FALLS:  Has patient fallen in last 6 months? No  LIVING ENVIRONMENT: Lives with: lives alone Lives in: House/apartment Stairs: Yes: Internal: 1 flight steps; handrails both sides Has following equipment at home: walker and cane  OCCUPATION:   PLOF: Independent  PATIENT GOALS: be  able to get upstairs to shower   NEXT MD VISIT: 03/02/22  OBJECTIVE:   DIAGNOSTIC FINDINGS:   PATIENT SURVEYS:  Eval:  FOTO 39, goal 80  COGNITION: Overall cognitive status: Within functional limits for tasks assessed     SENSATION: Not tested   POSTURE: weight shift left  PALPATION: Scar adhesions noted Rt surgical incision   LOWER EXTREMITY ROM: Eval: Right knee flexion 18-75 Left knee is [facility]  02/09/2022: Right knee flexion 91 degrees in supine  02/11/2022: Supine A/ROM Right knee:  0 - 93 deg Seated A/ROM Right knee:  5 - 82 deg Seated left knee A/ROM 0-123 deg  02/13/2022: Seated A/ROM Right knee:  5 - 95 deg  02/16/22: Knee flexion: 106 in supine  02/23/22:  Knee flexion: 110 degrees F supine  02/25/2022: Seated right knee A/ROM: 0 -108 deg Supine right knee A/ROM: 0 -110 deg Supine right knee AA/ROM with strap:  0-112 deg  LOWER EXTREMITY MMT:  MMT Right eval Left eval  Hip flexion 3 5   Hip extension    Hip  abduction 3+   Hip adduction    Hip internal rotation    Hip external rotation    Knee flexion    Knee extension    Ankle dorsiflexion    Ankle plantarflexion    Ankle inversion    Ankle eversion     (Blank rows = not tested)  LOWER EXTREMITY SPECIAL TESTS:    FUNCTIONAL TESTS:  Eval: 5 times sit to stand: 13 sec, 2 HHA, weight shifted Lt Timed up and go (TUG): 16 sec, using RW  02/18/2022: 3 minute walk test:  341 ft with RW  GAIT: Distance walked: through clinic to treatment room and gym Assistive device utilized: Environmental consultant - 2 wheeled Level of assistance: Modified independence Comments: Rt LE decreased knee flexion/hip extension during swing phase and minimal PF noted during pushoff   TODAY'S TREATMENT:     DATE: 02/25/2022 Recumbent bike level 1 x7 minutes with PT present to discuss status Seated LAQ with 3# 2x10 RLE Seated heel slides with foot on slider 2x10 Sit to/from stand 2x10 Leg Press (seat at 7) 65# 2x10 Supine heel slide with foot on slider and use of strap 2x10 RLE (able to achieve 112 deg flexion) Supine straight leg raise with quad set 2x10 RLE Game Ready to right knee:  3 snowflakes, medium compression, x10 min   02/03/22: Nustep Old model L3 8 mi with PTA present to discuss status. Sit to stand 1 pad on mat table 10x, no extra pad from mat table 10x LAQ 3# 3 sec old 2x10 Leg press (7) 65# Bil 10x, RTLE 25# 2x10   Forward step ups on stairs 2x10 with light UE on rails Standing hip abduction 0# 2x10  Knee flexion stretching in supine utilizing the Creep theory.  Game Ready to right knee:  3 snowflakes, medium compression, x15 min Knee flexion measurement: 110  02/20/22: Nustep L5 new model 8 min with PTA present GAIT: Standard cane around clinic 1x, no significant cues needed Sit to stand with 1 pad on mat table no UE 2x5 Leg press Seat 8: Bil 55# 2x10, RTLE 20# 10x2 Vc to contract quad. LAQ 2# x10 3 sec hold Game Ready to right knee:  3  snowflakes, medium compression, x10 min    PATIENT EDUCATION:  Education details: eval findings/POC; importance of gaining knee extension for pain/swelling/mobility; HEP Person educated: Patient Education method: Explanation, Verbal cues, and Handouts Education comprehension:  verbalized understanding and returned demonstration  HOME EXERCISE PROGRAM: Access Code: [facility] URL: [email or web address] Date: 02/25/2022 Prepared by: Shelby Dubin Lanna Labella  Exercises - Seated Passive Knee Extension  - 3 x daily - 7 x weekly - up to 1 minute hold - Seated Knee Flexion AAROM  - 3 x daily - 7 x weekly - 30 seconds hold - Seated Long Arc Quad  - 3 x daily - 7 x weekly - 1 sets - 10 reps - 5 hold - Standing Marching  - 1-2 x daily - 7 x weekly - 1 sets - 10 reps - Standing Knee Flexion AROM with Chair Support  - 1 x daily - 7 x weekly - 2 sets - 10 reps - Standing Hip Abduction with Counter Support  - 1 x daily - 7 x weekly - 2 sets - 10 reps - Long Sitting Quad Set with Towel Roll Under Heel  - 1 x daily - 7 x weekly - 3 sets - 10 reps - Active Straight Leg Raise with Quad Set  - 1 x daily - 7 x weekly - 2 sets - 10 reps - Supine Heel Slide with Strap  - 1 x daily - 7 x weekly - 2 sets - 10 reps - Supine Short Arc Quad  - 1 x daily - 7 x weekly - 2 sets - 10 reps  Patient Education - Scar Massage  Patient Education - Scar Massage  ASSESSMENT:  CLINICAL IMPRESSION:  Ms Lazard presents to skilled PT reporting that she is still having most difficulty sleeping at night.  States that pain during the day is 3-4/10, but as the day progresses, her pain increases.  Patient able to ambulate limited distances within clinic without assistive device with slow and methodic gait pattern.  Patient continues to progress with right knee flexion.  Pt with too much guarding for PT to safely perform P/ROM, but with patient performing AA/ROM with strap, she was able to achieve 112 degrees flexion.   Patient continues to progress each visit with increased ROM.  Patient is progressing with improved strength and improved control of muscle contraction during exercises.  Patient continues to progress towards goal related activities.   OBJECTIVE IMPAIRMENTS: Abnormal gait, decreased activity tolerance, decreased balance, decreased endurance, decreased mobility, difficulty walking, decreased ROM, decreased strength, hypomobility, increased edema, increased fascial restrictions, impaired flexibility, postural dysfunction, and pain.   ACTIVITY LIMITATIONS: standing, squatting, sleeping, stairs, transfers, bed mobility, bathing, dressing, hygiene/grooming, and locomotion level  PARTICIPATION LIMITATIONS: cleaning, laundry, driving, shopping, community activity, and yard work  PERSONAL FACTORS: Age, Fitness, Past/current experiences, Time since onset of injury/illness/exacerbation, and 3+ comorbidities: Rt THA, Lt TKA, DM, anxiety  are also affecting patient's functional outcome.   REHAB POTENTIAL: Good  CLINICAL DECISION MAKING: Evolving/moderate complexity  EVALUATION COMPLEXITY: Moderate   GOALS: Goals reviewed with patient? Yes  SHORT TERM GOALS: Target date: 02/18/22 Pt will be independent with initial HEP to improve ROM and mobility. Baseline: Goal status: Goal met 02/09/21  2.  Pt will have atleast 90 deg of Rt knee flexion Baseline:  Goal status:Goal met 02/09/21  3.  Pt will have Rt knee extension to 0 deg.  Baseline:  Goal status: MET on 02/18/2022    LONG TERM GOALS: Target date: 04/01/22  Pt will have 120 deg Rt knee flexion and 0 deg Rt knee extension Baseline: 75 deg flexion, 18 deg ext Goal status: IN PROGRESS (see above)  2.  Pt will report atleast 50% improvement in  Rt knee pain with ADLs. Baseline:  Goal status: INITIAL  3.  Pt will be able to ambulate with LRAD atleast 166f with minimal compensations in her gait pattern.  Baseline:  Goal status: INITIAL  4.   Pt will be able to ascend and descend 1 flight of stairs with/without handrails and step over pattern to allow her to get upstairs to her shower. Baseline:  Goal status: INITIAL  5.  Pt will have 5/5 Rt hip/knee strength to improver her mechanics and efficiency with daily activity.  Baseline:  Goal status: INITIAL    PLAN:  PT FREQUENCY:  3x/week for 4 weeks, then decrease to 1-2x/week for 4 weeks   PT DURATION: 8 weeks  PLANNED INTERVENTIONS: Therapeutic exercises, Therapeutic activity, Neuromuscular re-education, Balance training, Gait training, Patient/Family education, Self Care, Joint mobilization, Cryotherapy, Manual therapy, and Re-evaluation  PLAN FOR NEXT SESSION: Assess and progress HEP as indicated, ROM, strengthening, progress ambulation and standing exercises   SJuel Burrow PT 02/25/22 10:35 AM    [facility] [address] S[address] ColumbusGHigbee [place] [address]Phone # [phone] [phone]

## 2022-02-27 ENCOUNTER — Ambulatory Visit: Payer: 59 | Admitting: Physical Therapy

## 2022-02-27 ENCOUNTER — Encounter: Payer: Self-pay | Admitting: Physical Therapy

## 2022-02-27 DIAGNOSIS — R2681 Unsteadiness on feet: Secondary | ICD-10-CM

## 2022-02-27 DIAGNOSIS — M25561 Pain in right knee: Secondary | ICD-10-CM | POA: Diagnosis not present

## 2022-02-27 DIAGNOSIS — Z483 Aftercare following surgery for neoplasm: Secondary | ICD-10-CM

## 2022-02-27 DIAGNOSIS — M25661 Stiffness of right knee, not elsewhere classified: Secondary | ICD-10-CM

## 2022-02-27 DIAGNOSIS — R600 Localized edema: Secondary | ICD-10-CM

## 2022-02-27 NOTE — Therapy (Signed)
OUTPATIENT PHYSICAL THERAPY TREATMENT NOTE   Patient Name: Sierra Perez MRN: [id number] DOB:1960-06-27, 62 y.o., female Today's Date: 02/27/2022  END OF SESSION:  PT End of Session - 02/27/22 1018     Visit Number 10    Number of Visits 5    Date for PT Re-Evaluation 04/01/22    Authorization Type [employer]    Authorization Time Period 02/04/22 to 04/01/22    Authorization - Visit Number 10    Authorization - Number of Visits 23    Progress Note Due on Visit 10    PT Start Time 1017    PT Stop Time 1117    PT Time Calculation (min) 60 min    Activity Tolerance Patient tolerated treatment well    Behavior During Therapy WFL for tasks assessed/performed                Past Medical History:  Diagnosis Date   Anemia    Anxiety    Arthritis    Asthma    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, her-2 positive, ER/PR negative   Clotting disorder ([place])    testing was negative, h/o DVT while on treatment   Colon polyps    Complex regional pain syndrome i of right lower limb    RDS   Complication of anesthesia    Cystitis    Diabetes mellitus without complication ([place])    DVT (deep venous thrombosis) ([place])    started in 2015, multiple   Family history of breast cancer    Family history of colon cancer    Fibroid    Fibromyalgia    GERD (gastroesophageal reflux disease)    Heart murmur    HLD (hyperlipidemia)    HPV in female    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   Hypertension    Hypothyroidism    IBS (irritable bowel syndrome)    Mitral valve prolapse    Peptic ulcer    Personal history of malignant neoplasm of breast    Sleep apnea treated with continuous positive airway pressure (CPAP)    Past Surgical History:  Procedure Laterality Date   ABDOMINAL HYSTERECTOMY  2010   fibroids, h/o abnormal pap smears   AXILLARY SENTINEL NODE BIOPSY Right 07/08/2020   Procedure: RIGHT AXILLARY SENTINEL NODE BIOPSY;  Surgeon: Rolm Bookbinder, MD;  Location: [place];  Service: General;  Laterality: Right;   BREAST RECONSTRUCTION WITH PLACEMENT OF TISSUE EXPANDER AND FLEX HD (ACELLULAR HYDRATED DERMIS) Bilateral 07/08/2020   Procedure: BILATERAL BREAST RECONSTRUCTION WITH  FLEX HD (ACELLULAR HYDRATED DERMIS),DIRECT IMPLANT RECONSTRUCTION;  Surgeon: Cindra Presume, MD;  Location: [place];  Service: Plastics;  Laterality: Bilateral;   BREAST SURGERY Right 2015   right lumpectomy with reduction and lift   COLONOSCOPY     DENTAL SURGERY     ESOPHAGOGASTRODUODENOSCOPY     LAPAROSCOPIC GASTRIC SLEEVE RESECTION  2014   LAPAROSCOPIC OOPHERECTOMY Bilateral 2016   REPLACEMENT TOTAL KNEE Left 2018   also had 3 prior arthroscopies   TOE SURGERY Right    dislocated toe   TOTAL HIP ARTHROPLASTY Right 11/19/2020   Procedure: RIGHT TOTAL HIP ARTHROPLASTY ANTERIOR APPROACH;  Surgeon: Mcarthur Rossetti, MD;  Location: [place];  Service: Orthopedics;  Laterality: Right;   TOTAL KNEE ARTHROPLASTY Right 01/09/2022   Procedure: RIGHT TOTAL KNEE ARTHROPLASTY;  Surgeon: Mcarthur Rossetti, MD;  Location: [facility];  Service: Orthopedics;  Laterality: Right;   TOTAL  MASTECTOMY Bilateral 07/08/2020   Procedure: BILATERAL TOTAL MASTECTOMY;  Surgeon: Rolm Bookbinder, MD;  Location: [place];  Service: General;  Laterality: Bilateral;   VENA CAVA FILTER PLACEMENT  2017   Patient Active Problem List   Diagnosis Date Noted   Status post total right knee replacement 01/09/2022   Obesity (BMI 30-39.9) 06/23/2021   Iron deficiency anemia 01/23/2021   Status post right hip replacement 12/03/2020   Status post total replacement of right hip 11/19/2020   Breast cancer ([place]) 07/08/2020   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   H/O total hysterectomy 06/11/2020   Genetic testing 05/27/2020   Chronic superficial gastritis 05/27/2020   Fibromyalgia 05/27/2020   GERD (gastroesophageal reflux disease) 05/27/2020   Prediabetes 05/27/2020   Complex  regional pain syndrome I, unspecified 05/27/2020   Atypical lobular hyperplasia [facility]) of left breast 05/23/2020   History of therapeutic radiation 05/23/2020   Family history of breast cancer    Family history of colon cancer    Personal history of malignant neoplasm of breast    Unilateral primary osteoarthritis, right knee 02/26/2020   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   DVT (deep venous thrombosis) ([place]) 05/04/2018   Malignant neoplasm of upper-outer quadrant of right breast in female, estrogen receptor negative ([place]) 05/03/2018   Obstructive sleep apnea (adult) (pediatric) 09/14/2017   Adenopathy 07/15/2017   B12 deficiency 07/15/2017   Disorder of tympanic membrane of right ear 07/15/2017   Recurrent UTI 03/09/2017   Bladder pain 02/25/2017   Proteinuria 02/25/2017   Hematoma of thigh, left, subsequent encounter 08/16/2016   At risk for obstructive sleep apnea 07/01/2016   Suspected sleep apnea 07/01/2016   Arthritis of left knee 05/26/2016   Localized primary osteoarthritis of lower leg, left 05/26/2016   Clotting disorder ([place]) 05/22/2016   Chronic anticoagulation 01/06/2016   Right-sided epistaxis 01/06/2016   Urinary tract infection, site not specified 08/12/2015   Abnormal Pap smear of vagina 05/06/2015   Follow-up examination, following other surgery 12/14/2013   Pelvic and perineal pain 11/28/2013   Anxiety and depression 11/16/2013   H/O bariatric surgery 11/16/2013   Atrophic vaginitis 11/13/2013   Tear of lateral cartilage or meniscus of knee, current 04/14/2013   Allergic rhinitis 01/16/2013   Female genital symptoms 11/10/2012   Symptomatic menopausal or female climacteric states 11/10/2012   Disequilibrium 06/09/2012   Morbid obesity ([place]) 02/09/2012   Vitamin D deficiency 09/13/2008   Preop examination 04/20/2008   Hypothyroidism 05/20/2007    PCP: Wenda Low, MD  REFERRING PROVIDER: Erskine Emery, PA-C  REFERRING DIAG: s/p Rt TKA  THERAPY DIAG:  Acute  pain of right knee  Stiffness of right knee, not elsewhere classified  Localized edema  Unsteadiness on feet  Aftercare following surgery for neoplasm  Rationale for Evaluation and Treatment: Rehabilitation  ONSET DATE: 01/09/22  SUBJECTIVE:   SUBJECTIVE STATEMENT: A little pain this AM.  PERTINENT HISTORY: Rt THA 2022, Rt TKA 01/09/22, Lt TKA 2018, Hx of Breast Cancer s/p double mastectomy with sentinel lymph node removed from right side  PAIN:  Are you having pain? Yes Reports 3-4/10 pain in right knee  PRECAUTIONS: Knee, s/p bilateral mastectomy  WEIGHT BEARING RESTRICTIONS: No  FALLS:  Has patient fallen in last 6 months? No  LIVING ENVIRONMENT: Lives with: lives alone Lives in: House/apartment Stairs: Yes: Internal: 1 flight steps; handrails both sides Has following equipment at home: walker and cane  OCCUPATION:   PLOF: Independent  PATIENT GOALS: be able to get upstairs to shower  NEXT MD VISIT: 03/02/22  OBJECTIVE:   DIAGNOSTIC FINDINGS:   PATIENT SURVEYS:  Eval:  FOTO 39, goal 48  COGNITION: Overall cognitive status: Within functional limits for tasks assessed     SENSATION: Not tested   POSTURE: weight shift left  PALPATION: Scar adhesions noted Rt surgical incision   LOWER EXTREMITY ROM: Eval: Right knee flexion 18-75 Left knee is [facility]  02/09/2022: Right knee flexion 91 degrees in supine  02/11/2022: Supine A/ROM Right knee:  0 - 93 deg Seated A/ROM Right knee:  5 - 82 deg Seated left knee A/ROM 0-123 deg  02/13/2022: Seated A/ROM Right knee:  5 - 95 deg  02/16/22: Knee flexion: 106 in supine  02/23/22:  Knee flexion: 110 degrees F supine  02/25/2022: Seated right knee A/ROM: 0 -108 deg Supine right knee A/ROM: 0 -110 deg Supine right knee AA/ROM with strap:  0-112 deg  LOWER EXTREMITY MMT:  MMT Right eval Left eval  Hip flexion 3 5   Hip extension    Hip abduction 3+   Hip adduction    Hip internal rotation    Hip  external rotation    Knee flexion    Knee extension    Ankle dorsiflexion    Ankle plantarflexion    Ankle inversion    Ankle eversion     (Blank rows = not tested)  LOWER EXTREMITY SPECIAL TESTS:    FUNCTIONAL TESTS:  Eval: 5 times sit to stand: 13 sec, 2 HHA, weight shifted Lt Timed up and go (TUG): 16 sec, using RW  02/18/2022: 3 minute walk test:  341 ft with RW  GAIT: Distance walked: through clinic to treatment room and gym Assistive device utilized: Environmental consultant - 2 wheeled Level of assistance: Modified independence Comments: Rt LE decreased knee flexion/hip extension during swing phase and minimal PF noted during pushoff   TODAY'S TREATMENT:     02/27/22: Nustep L2-3 10 min with PTA discussing status Seated adductor squeeze 20x, added LAQ 3# 2x10 Sit stand holding 5# KB: used purple pad 2x10 Leg press (Seat 7) Bil 70# 15x, RTLE 30# 2x10 Forward step ups 2x10 light UE VC to contract quads and glutes: pt will add this to her gym routne Game Ready to right knee:  3 snowflakes, medium compression, x10 min  DATE: 02/25/2022 Recumbent bike level 1 x7 minutes with PT present to discuss status Seated LAQ with 3# 2x10 RLE Seated heel slides with foot on slider 2x10 Sit to/from stand 2x10 Leg Press (seat at 7) 65# 2x10 Supine heel slide with foot on slider and use of strap 2x10 RLE (able to achieve 112 deg flexion) Supine straight leg raise with quad set 2x10 RLE Game Ready to right knee:  3 snowflakes, medium compression, x10 min   02/03/22: Nustep Old model L3 8 mi with PTA present to discuss status. Sit to stand 1 pad on mat table 10x, no extra pad from mat table 10x LAQ 3# 3 sec old 2x10 Leg press (7) 65# Bil 10x, RTLE 25# 2x10   Forward step ups on stairs 2x10 with light UE on rails Standing hip abduction 0# 2x10  Knee flexion stretching in supine utilizing the Creep theory.  Game Ready to right knee:  3 snowflakes, medium compression, x15 min Knee flexion  measurement: 110   PATIENT EDUCATION:  Education details: eval findings/POC; importance of gaining knee extension for pain/swelling/mobility; HEP Person educated: Patient Education method: Explanation, Verbal cues, and Handouts Education comprehension: verbalized understanding and returned demonstration  HOME EXERCISE PROGRAM: Access Code: [email or web address] URL: [email or web address] Date: 02/25/2022 Prepared by: Shelby Dubin Menke  Exercises - Seated Passive Knee Extension  - 3 x daily - 7 x weekly - up to 1 minute hold - Seated Knee Flexion AAROM  - 3 x daily - 7 x weekly - 30 seconds hold - Seated Long Arc Quad  - 3 x daily - 7 x weekly - 1 sets - 10 reps - 5 hold - Standing Marching  - 1-2 x daily - 7 x weekly - 1 sets - 10 reps - Standing Knee Flexion AROM with Chair Support  - 1 x daily - 7 x weekly - 2 sets - 10 reps - Standing Hip Abduction with Counter Support  - 1 x daily - 7 x weekly - 2 sets - 10 reps - Long Sitting Quad Set with Towel Roll Under Heel  - 1 x daily - 7 x weekly - 3 sets - 10 reps - Active Straight Leg Raise with Quad Set  - 1 x daily - 7 x weekly - 2 sets - 10 reps - Supine Heel Slide with Strap  - 1 x daily - 7 x weekly - 2 sets - 10 reps - Supine Short Arc Quad  - 1 x daily - 7 x weekly - 2 sets - 10 reps  Patient Education - Scar Massage  Patient Education - Scar Massage  ASSESSMENT:  CLINICAL IMPRESSION: Pt had a good week in PT: gained good ROM, edema reducing and pt now consistently off her walker. Pt is now going to the gym on her off days to ride the bike and do the leg press. Pain mainly at night, medication helps.    OBJECTIVE IMPAIRMENTS: Abnormal gait, decreased activity tolerance, decreased balance, decreased endurance, decreased mobility, difficulty walking, decreased ROM, decreased strength, hypomobility, increased edema, increased fascial restrictions, impaired flexibility, postural dysfunction, and pain.   ACTIVITY LIMITATIONS:  standing, squatting, sleeping, stairs, transfers, bed mobility, bathing, dressing, hygiene/grooming, and locomotion level  PARTICIPATION LIMITATIONS: cleaning, laundry, driving, shopping, community activity, and yard work  PERSONAL FACTORS: Age, Fitness, Past/current experiences, Time since onset of injury/illness/exacerbation, and 3+ comorbidities: Rt THA, Lt TKA, DM, anxiety  are also affecting patient's functional outcome.   REHAB POTENTIAL: Good  CLINICAL DECISION MAKING: Evolving/moderate complexity  EVALUATION COMPLEXITY: Moderate   GOALS: Goals reviewed with patient? Yes  SHORT TERM GOALS: Target date: 02/18/22 Pt will be independent with initial HEP to improve ROM and mobility. Baseline: Goal status: Goal met 02/09/21  2.  Pt will have atleast 90 deg of Rt knee flexion Baseline:  Goal status:Goal met 02/09/21  3.  Pt will have Rt knee extension to 0 deg.  Baseline:  Goal status: MET on 02/18/2022    LONG TERM GOALS: Target date: 04/01/22  Pt will have 120 deg Rt knee flexion and 0 deg Rt knee extension Baseline: 75 deg flexion, 18 deg ext Goal status: IN PROGRESS (see above)  2.  Pt will report atleast 50% improvement in Rt knee pain with ADLs. Baseline:  Goal status: INITIAL  3.  Pt will be able to ambulate with LRAD atleast 143f with minimal compensations in her gait pattern.  Baseline:  Goal status: INITIAL  4.  Pt will be able to ascend and descend 1 flight of stairs with/without handrails and step over pattern to allow her to get upstairs to her shower. Baseline:  Goal status: INITIAL  5.  Pt will have 5/5 Rt hip/knee strength to improver  her mechanics and efficiency with daily activity.  Baseline:  Goal status: INITIAL    PLAN:  PT FREQUENCY:  3x/week for 4 weeks, then decrease to 1-2x/week for 4 weeks   PT DURATION: 8 weeks  PLANNED INTERVENTIONS: Therapeutic exercises, Therapeutic activity, Neuromuscular re-education, Balance training, Gait  training, Patient/Family education, Self Care, Joint mobilization, Cryotherapy, Manual therapy, and Re-evaluation  PLAN FOR NEXT SESSION: Assess and progress HEP as indicated, ROM, strengthening, progress ambulation and standing exercises   Myrene Galas, PTA 02/27/22 5:09 PM  [facility] [address] [place], [place] [address] Phone # [phone] Fax [phone]

## 2022-03-02 ENCOUNTER — Telehealth: Payer: Self-pay | Admitting: Orthopaedic Surgery

## 2022-03-02 ENCOUNTER — Ambulatory Visit (INDEPENDENT_AMBULATORY_CARE_PROVIDER_SITE_OTHER): Payer: Commercial Managed Care - PPO | Admitting: Physician Assistant

## 2022-03-02 ENCOUNTER — Encounter: Payer: Commercial Managed Care - PPO | Admitting: Physical Therapy

## 2022-03-02 ENCOUNTER — Encounter: Payer: Commercial Managed Care - PPO | Admitting: Physician Assistant

## 2022-03-02 ENCOUNTER — Encounter: Payer: Self-pay | Admitting: Physician Assistant

## 2022-03-02 DIAGNOSIS — Z96651 Presence of right artificial knee joint: Secondary | ICD-10-CM

## 2022-03-02 MED ORDER — CYCLOBENZAPRINE HCL 10 MG PO TABS: 10.0000 mg | ORAL_TABLET | Freq: Every day | ORAL | 0 refills | Status: DC

## 2022-03-02 NOTE — Telephone Encounter (Signed)
[employer] STD forms received. To [employer].

## 2022-03-02 NOTE — Progress Notes (Signed)
HPI: [name] is now approximately 7 weeks status post right total knee arthroplasty.  She is making progress with therapy.  However she continues to go to therapy 3 days/week.  She is just recently began using a cane instead of a walker to ambulate.  She is having difficulty sleeping at night.  States taking this Tylenol for pain at this point time.  Notes that her pain is mostly time 4-5 out of 10 pain but it is 7 out of 10 pain at worst.  This is generally after therapy.  Review of systems: See HPI otherwise negative  Physical exam: General well-developed well-nourished female no acute distress.  Mood and affect appropriate. Right knee full extension.  Flexion to approximately 110 degrees.  No instability valgus varus stressing.  No signs of varus knee about the knee.  Calf supple nontender.  Surgical incisions well-healed no signs of infection or wound breakdown.  Impression: Status post right total knee arthroplasty 01/09/2022  Plan: Patient will continue physical therapy to work on range of motion and strengthening.  Also to assist her transitioning off of assistive device i.e. cane.  In regards to her not sleeping we will DC her Robaxin and place her on Flexeril at night.  Also discussed good sleep hygiene.  Recommend that she remain out of work until March 30, 2022.  This will allow her time for physical therapy also to work on sleep hygiene and for her pain to diminish.  Currently she is not at [facility].  Questions were encouraged and answered by Dr. Ninfa Linden and myself

## 2022-03-04 ENCOUNTER — Encounter: Payer: Self-pay | Admitting: Rehabilitative and Restorative Service Providers"

## 2022-03-04 ENCOUNTER — Ambulatory Visit: Payer: 59 | Admitting: Rehabilitative and Restorative Service Providers"

## 2022-03-04 DIAGNOSIS — R2681 Unsteadiness on feet: Secondary | ICD-10-CM

## 2022-03-04 DIAGNOSIS — M25561 Pain in right knee: Secondary | ICD-10-CM | POA: Diagnosis not present

## 2022-03-04 DIAGNOSIS — M25661 Stiffness of right knee, not elsewhere classified: Secondary | ICD-10-CM

## 2022-03-04 DIAGNOSIS — R600 Localized edema: Secondary | ICD-10-CM

## 2022-03-04 NOTE — Therapy (Signed)
OUTPATIENT PHYSICAL THERAPY TREATMENT NOTE   Patient Name: Sierra Perez MRN: [id number] DOB:07/02/1960, 62 y.o., female Today's Date: 03/04/2022  END OF SESSION:  PT End of Session - 03/04/22 1149     Visit Number 11    Date for PT Re-Evaluation 04/01/22    Authorization Type [employer] - Visit Number 11    Authorization - Number of Visits 23    PT Start Time 1147    PT Stop Time 5027    PT Time Calculation (min) 48 min    Activity Tolerance Patient tolerated treatment well    Behavior During Therapy WFL for tasks assessed/performed                Past Medical History:  Diagnosis Date   Anemia    Anxiety    Arthritis    Asthma    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, her-2 positive, ER/PR negative   Clotting disorder ([place])    testing was negative, h/o DVT while on treatment   Colon polyps    Complex regional pain syndrome i of right lower limb    RDS   Complication of anesthesia    Cystitis    Diabetes mellitus without complication ([place])    DVT (deep venous thrombosis) ([place])    started in 2015, multiple   Family history of breast cancer    Family history of colon cancer    Fibroid    Fibromyalgia    GERD (gastroesophageal reflux disease)    Heart murmur    HLD (hyperlipidemia)    HPV in female    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   Hypertension    Hypothyroidism    IBS (irritable bowel syndrome)    Mitral valve prolapse    Peptic ulcer    Personal history of malignant neoplasm of breast    Sleep apnea treated with continuous positive airway pressure (CPAP)    Past Surgical History:  Procedure Laterality Date   ABDOMINAL HYSTERECTOMY  2010   fibroids, h/o abnormal pap smears   AXILLARY SENTINEL NODE BIOPSY Right 07/08/2020   Procedure: RIGHT AXILLARY SENTINEL NODE BIOPSY;  Surgeon: Rolm Bookbinder, MD;  Location: [facility];  Service: General;  Laterality: Right;   BREAST RECONSTRUCTION WITH  PLACEMENT OF TISSUE EXPANDER AND FLEX HD (ACELLULAR HYDRATED DERMIS) Bilateral 07/08/2020   Procedure: BILATERAL BREAST RECONSTRUCTION WITH  FLEX HD (ACELLULAR HYDRATED DERMIS),DIRECT IMPLANT RECONSTRUCTION;  Surgeon: Cindra Presume, MD;  Location: [facility];  Service: Plastics;  Laterality: Bilateral;   BREAST SURGERY Right 2015   right lumpectomy with reduction and lift   COLONOSCOPY     DENTAL SURGERY     ESOPHAGOGASTRODUODENOSCOPY     LAPAROSCOPIC GASTRIC SLEEVE RESECTION  2014   LAPAROSCOPIC OOPHERECTOMY Bilateral 2016   REPLACEMENT TOTAL KNEE Left 2018   also had 3 prior arthroscopies   TOE SURGERY Right    dislocated toe   TOTAL HIP ARTHROPLASTY Right 11/19/2020   Procedure: RIGHT TOTAL HIP ARTHROPLASTY ANTERIOR APPROACH;  Surgeon: Mcarthur Rossetti, MD;  Location: [facility];  Service: Orthopedics;  Laterality: Right;   TOTAL KNEE ARTHROPLASTY Right 01/09/2022   Procedure: RIGHT TOTAL KNEE ARTHROPLASTY;  Surgeon: Mcarthur Rossetti, MD;  Location: [facility];  Service: Orthopedics;  Laterality: Right;   TOTAL MASTECTOMY Bilateral 07/08/2020   Procedure: BILATERAL TOTAL MASTECTOMY;  Surgeon: Rolm Bookbinder, MD;  Location: [facility];  Service: General;  Laterality: Bilateral;  VENA CAVA FILTER PLACEMENT  2017   Patient Active Problem List   Diagnosis Date Noted   Status post total right knee replacement 01/09/2022   Obesity (BMI 30-39.9) 06/23/2021   Iron deficiency anemia 01/23/2021   Status post right hip replacement 12/03/2020   Status post total replacement of right hip 11/19/2020   Breast cancer ([place]) 07/08/2020   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   H/O total hysterectomy 06/11/2020   Genetic testing 05/27/2020   Chronic superficial gastritis 05/27/2020   Fibromyalgia 05/27/2020   GERD (gastroesophageal reflux disease) 05/27/2020   Prediabetes 05/27/2020   Complex regional pain syndrome I, unspecified 05/27/2020   Atypical lobular hyperplasia [facility]) of left breast  05/23/2020   History of therapeutic radiation 05/23/2020   Family history of breast cancer    Family history of colon cancer    Personal history of malignant neoplasm of breast    Unilateral primary osteoarthritis, right knee 02/26/2020   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   DVT (deep venous thrombosis) ([place]) 05/04/2018   Malignant neoplasm of upper-outer quadrant of right breast in female, estrogen receptor negative ([place]) 05/03/2018   Obstructive sleep apnea (adult) (pediatric) 09/14/2017   Adenopathy 07/15/2017   B12 deficiency 07/15/2017   Disorder of tympanic membrane of right ear 07/15/2017   Recurrent UTI 03/09/2017   Bladder pain 02/25/2017   Proteinuria 02/25/2017   Hematoma of thigh, left, subsequent encounter 08/16/2016   At risk for obstructive sleep apnea 07/01/2016   Suspected sleep apnea 07/01/2016   Arthritis of left knee 05/26/2016   Localized primary osteoarthritis of lower leg, left 05/26/2016   Clotting disorder ([place]) 05/22/2016   Chronic anticoagulation 01/06/2016   Right-sided epistaxis 01/06/2016   Urinary tract infection, site not specified 08/12/2015   Abnormal Pap smear of vagina 05/06/2015   Follow-up examination, following other surgery 12/14/2013   Pelvic and perineal pain 11/28/2013   Anxiety and depression 11/16/2013   H/O bariatric surgery 11/16/2013   Atrophic vaginitis 11/13/2013   Tear of lateral cartilage or meniscus of knee, current 04/14/2013   Allergic rhinitis 01/16/2013   Female genital symptoms 11/10/2012   Symptomatic menopausal or female climacteric states 11/10/2012   Disequilibrium 06/09/2012   Morbid obesity ([place]) 02/09/2012   Vitamin D deficiency 09/13/2008   Preop examination 04/20/2008   Hypothyroidism 05/20/2007    PCP: Wenda Low, MD  REFERRING PROVIDER: Erskine Emery, PA-C  REFERRING DIAG: s/p Rt TKA  THERAPY DIAG:  Acute pain of right knee  Stiffness of right knee, not elsewhere classified  Localized  edema  Unsteadiness on feet  Rationale for Evaluation and Treatment: Rehabilitation  ONSET DATE: 01/09/22  SUBJECTIVE:   SUBJECTIVE STATEMENT:  Pt reports that her MD appointment went well and they were pleased with her progress.  Patient to return to work at the end of February.  States that she has been able to sleep better since they changed her medication at the appointment.  PERTINENT HISTORY: Rt THA 2022, Rt TKA 01/09/22, Lt TKA 2018, Hx of Breast Cancer s/p double mastectomy with sentinel lymph node removed from right side  PAIN:  Are you having pain? Yes Reports 4/10 pain in right knee  PRECAUTIONS: Knee, s/p bilateral mastectomy  WEIGHT BEARING RESTRICTIONS: No  FALLS:  Has patient fallen in last 6 months? No  LIVING ENVIRONMENT: Lives with: lives alone Lives in: House/apartment Stairs: Yes: Internal: 1 flight steps; handrails both sides Has following equipment at home: walker and cane  OCCUPATION:   PLOF: Independent  PATIENT  GOALS: be able to get upstairs to shower   NEXT MD VISIT: 03/02/22  OBJECTIVE:   DIAGNOSTIC FINDINGS:   PATIENT SURVEYS:  Eval:  FOTO 39, goal 53  COGNITION: Overall cognitive status: Within functional limits for tasks assessed     SENSATION: Not tested   POSTURE: weight shift left  PALPATION: Scar adhesions noted Rt surgical incision   LOWER EXTREMITY ROM: Eval: Right knee flexion 18-75 Left knee is [facility]  02/09/2022: Right knee flexion 91 degrees in supine  02/11/2022: Supine A/ROM Right knee:  0 - 93 deg Seated A/ROM Right knee:  5 - 82 deg Seated left knee A/ROM 0-123 deg  02/13/2022: Seated A/ROM Right knee:  5 - 95 deg  02/16/22: Knee flexion: 106 in supine  02/23/22:  Knee flexion: 110 degrees F supine  02/25/2022: Seated right knee A/ROM: 0 -108 deg Supine right knee A/ROM: 0 -110 deg Supine right knee AA/ROM with strap:  0-112 deg  LOWER EXTREMITY MMT:  MMT Right eval Left eval  Hip flexion 3 5    Hip extension    Hip abduction 3+   Hip adduction    Hip internal rotation    Hip external rotation    Knee flexion    Knee extension    Ankle dorsiflexion    Ankle plantarflexion    Ankle inversion    Ankle eversion     (Blank rows = not tested)  LOWER EXTREMITY SPECIAL TESTS:    FUNCTIONAL TESTS:  Eval: 5 times sit to stand: 13 sec, 2 HHA, weight shifted Lt Timed up and go (TUG): 16 sec, using RW  02/18/2022: 3 minute walk test:  341 ft with RW  GAIT: Distance walked: through clinic to treatment room and gym Assistive device utilized: Environmental consultant - 2 wheeled Level of assistance: Modified independence Comments: Rt LE decreased knee flexion/hip extension during swing phase and minimal PF noted during pushoff   TODAY'S TREATMENT:     DATE: 03/04/2022 Recumbent bike level 1 x6 minutes with PT present to discuss status Standing at stairs performing right knee flexion 2x10 (with 2 sec hold) FWD step ups with UE support with 6" step x10 bilat Sit stand holding 5# KB: used purple pad x10 Sit to/from stand with left LE outstretched to encourage increased weight bearing through RLE x10 Seated LAQ with 4# 2x10 RLE Seated heel slides with foot on slider 2x10 Standing at barre:  Mini squats x10 Leg Press (seat at 7) 80# 2x10 Game Ready to right knee:  3 snowflakes, medium compression, x10 min   02/27/22: Nustep L2-3 10 min with PTA discussing status Seated adductor squeeze 20x, added LAQ 3# 2x10 Sit stand holding 5# KB: used purple pad 2x10 Leg press (Seat 7) Bil 70# 15x, RTLE 30# 2x10 Forward step ups 2x10 light UE VC to contract quads and glutes: pt will add this to her gym routne Game Ready to right knee:  3 snowflakes, medium compression, x10 min  DATE: 02/25/2022 Recumbent bike level 1 x7 minutes with PT present to discuss status Seated LAQ with 3# 2x10 RLE Seated heel slides with foot on slider 2x10 Sit to/from stand 2x10 Leg Press (seat at 7) 65# 2x10 Supine heel  slide with foot on slider and use of strap 2x10 RLE (able to achieve 112 deg flexion) Supine straight leg raise with quad set 2x10 RLE Game Ready to right knee:  3 snowflakes, medium compression, x10 min    PATIENT EDUCATION:  Education details: eval findings/POC; importance of  gaining knee extension for pain/swelling/mobility; HEP Person educated: Patient Education method: Explanation, Verbal cues, and Handouts Education comprehension: verbalized understanding and returned demonstration  HOME EXERCISE PROGRAM: Access Code: [facility] URL: [email or web address] Date: 03/04/2022 Prepared by: Juel Burrow  Exercises - Active Straight Leg Raise with Quad Set  - 1 x daily - 7 x weekly - 2 sets - 10 reps - Supine Heel Slide with Strap  - 1 x daily - 7 x weekly - 2 sets - 10 reps - Seated Knee Flexion AAROM  - 3 x daily - 7 x weekly - 30 seconds hold - Seated Long Arc Quad  - 3 x daily - 7 x weekly - 1 sets - 10 reps - 5 hold - Standing Marching  - 1-2 x daily - 7 x weekly - 1 sets - 10 reps - Standing Knee Flexion AROM with Chair Support  - 1 x daily - 7 x weekly - 2 sets - 10 reps - Standing Hip Abduction with Counter Support  - 1 x daily - 7 x weekly - 2 sets - 10 reps - Standing Hamstring Stretch with Step  - 1 x daily - 7 x weekly - 1 sets - 2 reps - 20 sec hold - Mini Squat with Counter Support  - 1 x daily - 7 x weekly - 2 sets - 10 reps  ASSESSMENT:  CLINICAL IMPRESSION:  Ms Mooers presents to skilled PT after a favorable appointment with her orthopedist.  Patient able to progress with exercises and added standing stretch.  Progressed standing exercises and provided pt with an upgraded HEP.  Patient able to ambulate during session without any assistive device with less compensations noted.   OBJECTIVE IMPAIRMENTS: Abnormal gait, decreased activity tolerance, decreased balance, decreased endurance, decreased mobility, difficulty walking, decreased ROM, decreased  strength, hypomobility, increased edema, increased fascial restrictions, impaired flexibility, postural dysfunction, and pain.   ACTIVITY LIMITATIONS: standing, squatting, sleeping, stairs, transfers, bed mobility, bathing, dressing, hygiene/grooming, and locomotion level  PARTICIPATION LIMITATIONS: cleaning, laundry, driving, shopping, community activity, and yard work  PERSONAL FACTORS: Age, Fitness, Past/current experiences, Time since onset of injury/illness/exacerbation, and 3+ comorbidities: Rt THA, Lt TKA, DM, anxiety  are also affecting patient's functional outcome.   REHAB POTENTIAL: Good  CLINICAL DECISION MAKING: Evolving/moderate complexity  EVALUATION COMPLEXITY: Moderate   GOALS: Goals reviewed with patient? Yes  SHORT TERM GOALS: Target date: 02/18/22 Pt will be independent with initial HEP to improve ROM and mobility. Baseline: Goal status: Goal met 02/09/21  2.  Pt will have atleast 90 deg of Rt knee flexion Baseline:  Goal status:Goal met 02/09/21  3.  Pt will have Rt knee extension to 0 deg.  Baseline:  Goal status: MET on 02/18/2022    LONG TERM GOALS: Target date: 04/01/22  Pt will have 120 deg Rt knee flexion and 0 deg Rt knee extension Baseline: 75 deg flexion, 18 deg ext Goal status: IN PROGRESS (see above)  2.  Pt will report atleast 50% improvement in Rt knee pain with ADLs. Baseline:  Goal status: INITIAL  3.  Pt will be able to ambulate with LRAD atleast 173f with minimal compensations in her gait pattern.  Baseline:  Goal status: IN PROGRESS  4.  Pt will be able to ascend and descend 1 flight of stairs with/without handrails and step over pattern to allow her to get upstairs to her shower. Baseline:  Goal status: IN PROGRESS  5.  Pt will have 5/5 Rt hip/knee strength to improver  her mechanics and efficiency with daily activity.  Baseline:  Goal status: INITIAL    PLAN:  PT FREQUENCY:  3x/week for 4 weeks, then decrease to 1-2x/week for  4 weeks   PT DURATION: 8 weeks  PLANNED INTERVENTIONS: Therapeutic exercises, Therapeutic activity, Neuromuscular re-education, Balance training, Gait training, Patient/Family education, Self Care, Joint mobilization, Cryotherapy, Manual therapy, and Re-evaluation  PLAN FOR NEXT SESSION: Assess and progress HEP as indicated, ROM and remeasure A/ROM, strengthening, progress ambulation and standing exercises   Juel Burrow, PT 03/04/22 12:32 PM   [place] [address], [place], [place] [address] Phone # [phone] Fax [phone]

## 2022-03-05 ENCOUNTER — Encounter: Payer: Commercial Managed Care - PPO | Admitting: Rehabilitative and Restorative Service Providers"

## 2022-03-06 ENCOUNTER — Encounter: Payer: Self-pay | Admitting: Physical Therapy

## 2022-03-06 ENCOUNTER — Ambulatory Visit: Payer: 59 | Attending: Physician Assistant | Admitting: Physical Therapy

## 2022-03-06 DIAGNOSIS — M25661 Stiffness of right knee, not elsewhere classified: Secondary | ICD-10-CM | POA: Diagnosis present

## 2022-03-06 DIAGNOSIS — R2681 Unsteadiness on feet: Secondary | ICD-10-CM | POA: Diagnosis present

## 2022-03-06 DIAGNOSIS — M25561 Pain in right knee: Secondary | ICD-10-CM | POA: Diagnosis present

## 2022-03-06 DIAGNOSIS — R600 Localized edema: Secondary | ICD-10-CM

## 2022-03-06 NOTE — Therapy (Signed)
OUTPATIENT PHYSICAL THERAPY TREATMENT NOTE   Patient Name: Sierra Perez MRN: [id number] DOB:05-29-1960, 62 y.o., female Today's Date: 03/06/2022  END OF SESSION:  PT End of Session - 03/06/22 1104     Visit Number 12    Number of Visits 5    Date for PT Re-Evaluation 04/01/22    Authorization Type [employer]    Authorization Time Period 02/04/22 to 04/01/22    Authorization - Visit Number 12    Authorization - Number of Visits 23    PT Start Time 1101    PT Stop Time 6433    PT Time Calculation (min) 48 min    Activity Tolerance Patient tolerated treatment well    Behavior During Therapy WFL for tasks assessed/performed                Past Medical History:  Diagnosis Date   Anemia    Anxiety    Arthritis    Asthma    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, her-2 positive, ER/PR negative   Clotting disorder ([place])    testing was negative, h/o DVT while on treatment   Colon polyps    Complex regional pain syndrome i of right lower limb    RDS   Complication of anesthesia    Cystitis    Diabetes mellitus without complication ([place])    DVT (deep venous thrombosis) ([place])    started in 2015, multiple   Family history of breast cancer    Family history of colon cancer    Fibroid    Fibromyalgia    GERD (gastroesophageal reflux disease)    Heart murmur    HLD (hyperlipidemia)    HPV in female    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   Hypertension    Hypothyroidism    IBS (irritable bowel syndrome)    Mitral valve prolapse    Peptic ulcer    Personal history of malignant neoplasm of breast    Sleep apnea treated with continuous positive airway pressure (CPAP)    Past Surgical History:  Procedure Laterality Date   ABDOMINAL HYSTERECTOMY  2010   fibroids, h/o abnormal pap smears   AXILLARY SENTINEL NODE BIOPSY Right 07/08/2020   Procedure: RIGHT AXILLARY SENTINEL NODE BIOPSY;  Surgeon: Rolm Bookbinder, MD;  Location: [facility];   Service: General;  Laterality: Right;   BREAST RECONSTRUCTION WITH PLACEMENT OF TISSUE EXPANDER AND FLEX HD (ACELLULAR HYDRATED DERMIS) Bilateral 07/08/2020   Procedure: BILATERAL BREAST RECONSTRUCTION WITH  FLEX HD (ACELLULAR HYDRATED DERMIS),DIRECT IMPLANT RECONSTRUCTION;  Surgeon: Cindra Presume, MD;  Location: [place];  Service: Plastics;  Laterality: Bilateral;   BREAST SURGERY Right 2015   right lumpectomy with reduction and lift   COLONOSCOPY     DENTAL SURGERY     ESOPHAGOGASTRODUODENOSCOPY     LAPAROSCOPIC GASTRIC SLEEVE RESECTION  2014   LAPAROSCOPIC OOPHERECTOMY Bilateral 2016   REPLACEMENT TOTAL KNEE Left 2018   also had 3 prior arthroscopies   TOE SURGERY Right    dislocated toe   TOTAL HIP ARTHROPLASTY Right 11/19/2020   Procedure: RIGHT TOTAL HIP ARTHROPLASTY ANTERIOR APPROACH;  Surgeon: Mcarthur Rossetti, MD;  Location: [place];  Service: Orthopedics;  Laterality: Right;   TOTAL KNEE ARTHROPLASTY Right 01/09/2022   Procedure: RIGHT TOTAL KNEE ARTHROPLASTY;  Surgeon: Mcarthur Rossetti, MD;  Location: [facility];  Service: Orthopedics;  Laterality: Right;   TOTAL MASTECTOMY Bilateral 07/08/2020   Procedure: BILATERAL TOTAL MASTECTOMY;  Surgeon: Rolm Bookbinder, MD;  Location: [place];  Service: General;  Laterality: Bilateral;   VENA CAVA FILTER PLACEMENT  2017   Patient Active Problem List   Diagnosis Date Noted   Status post total right knee replacement 01/09/2022   Obesity (BMI 30-39.9) 06/23/2021   Iron deficiency anemia 01/23/2021   Status post right hip replacement 12/03/2020   Status post total replacement of right hip 11/19/2020   Breast cancer ([place]) 07/08/2020   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   H/O total hysterectomy 06/11/2020   Genetic testing 05/27/2020   Chronic superficial gastritis 05/27/2020   Fibromyalgia 05/27/2020   GERD (gastroesophageal reflux disease) 05/27/2020   Prediabetes 05/27/2020   Complex regional pain syndrome I, unspecified  05/27/2020   Atypical lobular hyperplasia [facility]) of left breast 05/23/2020   History of therapeutic radiation 05/23/2020   Family history of breast cancer    Family history of colon cancer    Personal history of malignant neoplasm of breast    Unilateral primary osteoarthritis, right knee 02/26/2020   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   DVT (deep venous thrombosis) ([place]) 05/04/2018   Malignant neoplasm of upper-outer quadrant of right breast in female, estrogen receptor negative ([place]) 05/03/2018   Obstructive sleep apnea (adult) (pediatric) 09/14/2017   Adenopathy 07/15/2017   B12 deficiency 07/15/2017   Disorder of tympanic membrane of right ear 07/15/2017   Recurrent UTI 03/09/2017   Bladder pain 02/25/2017   Proteinuria 02/25/2017   Hematoma of thigh, left, subsequent encounter 08/16/2016   At risk for obstructive sleep apnea 07/01/2016   Suspected sleep apnea 07/01/2016   Arthritis of left knee 05/26/2016   Localized primary osteoarthritis of lower leg, left 05/26/2016   Clotting disorder ([place]) 05/22/2016   Chronic anticoagulation 01/06/2016   Right-sided epistaxis 01/06/2016   Urinary tract infection, site not specified 08/12/2015   Abnormal Pap smear of vagina 05/06/2015   Follow-up examination, following other surgery 12/14/2013   Pelvic and perineal pain 11/28/2013   Anxiety and depression 11/16/2013   H/O bariatric surgery 11/16/2013   Atrophic vaginitis 11/13/2013   Tear of lateral cartilage or meniscus of knee, current 04/14/2013   Allergic rhinitis 01/16/2013   Female genital symptoms 11/10/2012   Symptomatic menopausal or female climacteric states 11/10/2012   Disequilibrium 06/09/2012   Morbid obesity ([place]) 02/09/2012   Vitamin D deficiency 09/13/2008   Preop examination 04/20/2008   Hypothyroidism 05/20/2007    PCP: Wenda Low, MD  REFERRING PROVIDER: Erskine Emery, PA-C  REFERRING DIAG: s/p Rt TKA  THERAPY DIAG:  Acute pain of right knee  Stiffness of  right knee, not elsewhere classified  Localized edema  Unsteadiness on feet  Rationale for Evaluation and Treatment: Rehabilitation  ONSET DATE: 01/09/22  SUBJECTIVE:   SUBJECTIVE STATEMENT: Continues to do well, no new complaints.   PERTINENT HISTORY: Rt THA 2022, Rt TKA 01/09/22, Lt TKA 2018, Hx of Breast Cancer s/p double mastectomy with sentinel lymph node removed from right side  PAIN:  Are you having pain? Yes Reports 5-6/10 pain in right knee  PRECAUTIONS: Knee, s/p bilateral mastectomy  WEIGHT BEARING RESTRICTIONS: No  FALLS:  Has patient fallen in last 6 months? No  LIVING ENVIRONMENT: Lives with: lives alone Lives in: House/apartment Stairs: Yes: Internal: 1 flight steps; handrails both sides Has following equipment at home: walker and cane  OCCUPATION:   PLOF: Independent  PATIENT GOALS: be able to get upstairs to shower   NEXT MD VISIT: 03/02/22  OBJECTIVE:   DIAGNOSTIC FINDINGS:  PATIENT SURVEYS:  Eval:  FOTO 39, goal 75  COGNITION: Overall cognitive status: Within functional limits for tasks assessed     SENSATION: Not tested   POSTURE: weight shift left  PALPATION: Scar adhesions noted Rt surgical incision   LOWER EXTREMITY ROM: Eval: Right knee flexion 18-75 Left knee is [facility]  02/09/2022: Right knee flexion 91 degrees in supine  02/11/2022: Supine A/ROM Right knee:  0 - 93 deg Seated A/ROM Right knee:  5 - 82 deg Seated left knee A/ROM 0-123 deg  02/13/2022: Seated A/ROM Right knee:  5 - 95 deg  02/16/22: Knee flexion: 106 in supine  02/23/22:  Knee flexion: 110 degrees F supine  02/25/2022: Seated right knee A/ROM: 0 -108 deg Supine right knee A/ROM: 0 -110 deg Supine right knee AA/ROM with strap:  0-112 deg Supine right knee 114 degrees active  LOWER EXTREMITY MMT:  MMT Right eval Left eval  Hip flexion 3 5   Hip extension    Hip abduction 3+   Hip adduction    Hip internal rotation    Hip external rotation     Knee flexion    Knee extension    Ankle dorsiflexion    Ankle plantarflexion    Ankle inversion    Ankle eversion     (Blank rows = not tested)  LOWER EXTREMITY SPECIAL TESTS:    FUNCTIONAL TESTS:  Eval: 5 times sit to stand: 13 sec, 2 HHA, weight shifted Lt Timed up and go (TUG): 16 sec, using RW  02/18/2022: 3 minute walk test:  341 ft with RW  GAIT: Distance walked: through clinic to treatment room and gym Assistive device utilized: Environmental consultant - 2 wheeled Level of assistance: Modified independence Comments: Rt LE decreased knee flexion/hip extension during swing phase and minimal PF noted during pushoff   TODAY'S TREATMENT:     03/06/22: Recumbent bike full forward AROM level 1x 8 minutes with PTA present to discuss status Leg Press (seat at 7) 80# 2x10 Bil RTLE 30# 2x10 Seated LAQ with 4# 2x10 RLE 2-3 sec hold Reverse walking holding cable 10x VC to contract glutes Up and down stairs in clinic reciprocally with 1 hand rail Prone hams curls 20x 0#  Game Ready to right knee:  3 snowflakes, medium compression, x10 min  DATE: 03/04/2022 Recumbent bike level 1 x6 minutes with PT present to discuss status Standing at stairs performing right knee flexion 2x10 (with 2 sec hold) FWD step ups with UE support with 6" step x10 bilat Sit stand holding 5# KB: used purple pad x10 Sit to/from stand with left LE outstretched to encourage increased weight bearing through RLE x10 Seated LAQ with 4# 2x10 RLE Seated heel slides with foot on slider 2x10 Standing at barre:  Mini squats x10 Leg Press (seat at 7) 80# 2x10 Game Ready to right knee:  3 snowflakes, medium compression, x10 min   02/27/22: Nustep L2-3 10 min with PTA discussing status Seated adductor squeeze 20x, added LAQ 3# 2x10 Sit stand holding 5# KB: used purple pad 2x10 Leg press (Seat 7) Bil 70# 15x, RTLE 30# 2x10 Forward step ups 2x10 light UE VC to contract quads and glutes: pt will add this to her gym routne Game  Ready to right knee:  3 snowflakes, medium compression, x10 min  PATIENT EDUCATION:  Education details: eval findings/POC; importance of gaining knee extension for pain/swelling/mobility; HEP Person educated: Patient Education method: Explanation, Verbal cues, and Handouts Education comprehension: verbalized understanding and returned demonstration  HOME EXERCISE PROGRAM: Access Code: [email or web address] URL: [email or web address] Date: 03/04/2022 Prepared by: Juel Burrow  Exercises - Active Straight Leg Raise with Quad Set  - 1 x daily - 7 x weekly - 2 sets - 10 reps - Supine Heel Slide with Strap  - 1 x daily - 7 x weekly - 2 sets - 10 reps - Seated Knee Flexion AAROM  - 3 x daily - 7 x weekly - 30 seconds hold - Seated Long Arc Quad  - 3 x daily - 7 x weekly - 1 sets - 10 reps - 5 hold - Standing Marching  - 1-2 x daily - 7 x weekly - 1 sets - 10 reps - Standing Knee Flexion AROM with Chair Support  - 1 x daily - 7 x weekly - 2 sets - 10 reps - Standing Hip Abduction with Counter Support  - 1 x daily - 7 x weekly - 2 sets - 10 reps - Standing Hamstring Stretch with Step  - 1 x daily - 7 x weekly - 1 sets - 2 reps - 20 sec hold - Mini Squat with Counter Support  - 1 x daily - 7 x weekly - 2 sets - 10 reps  ASSESSMENT:  CLINICAL IMPRESSION: Pt arrives with some moderate knee pain, unsure why the increase. Pt consistently ambulating with straight cane on all surfaces. Knee stiff today, ROM about the same but pt was ale to increase her resistance on the leg press and ambulated very well with her reverse walking. Knee flexion 114 active in supine post.   OBJECTIVE IMPAIRMENTS: Abnormal gait, decreased activity tolerance, decreased balance, decreased endurance, decreased mobility, difficulty walking, decreased ROM, decreased strength, hypomobility, increased edema, increased fascial restrictions, impaired flexibility, postural dysfunction, and pain.   ACTIVITY LIMITATIONS: standing,  squatting, sleeping, stairs, transfers, bed mobility, bathing, dressing, hygiene/grooming, and locomotion level  PARTICIPATION LIMITATIONS: cleaning, laundry, driving, shopping, community activity, and yard work  PERSONAL FACTORS: Age, Fitness, Past/current experiences, Time since onset of injury/illness/exacerbation, and 3+ comorbidities: Rt THA, Lt TKA, DM, anxiety  are also affecting patient's functional outcome.   REHAB POTENTIAL: Good  CLINICAL DECISION MAKING: Evolving/moderate complexity  EVALUATION COMPLEXITY: Moderate   GOALS: Goals reviewed with patient? Yes  SHORT TERM GOALS: Target date: 02/18/22 Pt will be independent with initial HEP to improve ROM and mobility. Baseline: Goal status: Goal met 02/09/21  2.  Pt will have atleast 90 deg of Rt knee flexion Baseline:  Goal status:Goal met 02/09/21  3.  Pt will have Rt knee extension to 0 deg.  Baseline:  Goal status: MET on 02/18/2022    LONG TERM GOALS: Target date: 04/01/22  Pt will have 120 deg Rt knee flexion and 0 deg Rt knee extension Baseline: 75 deg flexion, 18 deg ext Goal status: IN PROGRESS (see above)  2.  Pt will report atleast 50% improvement in Rt knee pain with ADLs. Baseline:  Goal status: INITIAL  3.  Pt will be able to ambulate with LRAD atleast 134f with minimal compensations in her gait pattern.  Baseline:  Goal status: IN PROGRESS  4.  Pt will be able to ascend and descend 1 flight of stairs with/without handrails and step over pattern to allow her to get upstairs to her shower. Baseline:  Goal status: IN PROGRESS  5.  Pt will have 5/5 Rt hip/knee strength to improver her mechanics and efficiency with daily activity.  Baseline:  Goal status: INITIAL    PLAN:  PT FREQUENCY:  3x/week for 4 weeks, then decrease to 1-2x/week for 4 weeks   PT DURATION: 8 weeks  PLANNED INTERVENTIONS: Therapeutic exercises, Therapeutic activity, Neuromuscular re-education, Balance training, Gait  training, Patient/Family education, Self Care, Joint mobilization, Cryotherapy, Manual therapy, and Re-evaluation  PLAN FOR NEXT SESSION: Assess and progress HEP as indicated, ROM and remeasure A/ROM, strengthening, progress ambulation and standing exercises  Myrene Galas, PTA 03/06/22 11:40 AM   [facility] [address], [place], [place] [address] Phone # [phone] Fax [phone]

## 2022-03-08 NOTE — Therapy (Unsigned)
OUTPATIENT PHYSICAL THERAPY TREATMENT NOTE   Patient Name: Sierra Perez MRN: [id number] DOB:1960/08/22, 62 y.o., female Today's Date: 03/09/2022  END OF SESSION:  PT End of Session - 03/09/22 1447     Visit Number 13    Number of Visits 5    Date for PT Re-Evaluation 04/01/22    Authorization Type [employer]    Authorization Time Period 02/04/22 to 04/01/22    Authorization - Visit Number 13    Authorization - Number of Visits 23    PT Start Time 1446    PT Stop Time 1545    PT Time Calculation (min) 59 min    Activity Tolerance Patient tolerated treatment well    Behavior During Therapy WFL for tasks assessed/performed                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, her-2 positive, ER/PR negative   Clotting disorder (HCC)    testing was negative, h/o DVT while on treatment   Colon polyps    Complex regional pain syndrome i of right lower limb    RDS   Complication of anesthesia    Cystitis    Diabetes mellitus without complication (HCC)    DVT (deep venous thrombosis) (HCC)    started in 2015, multiple   Family history of breast cancer    Family history of colon cancer    Fibroid    Fibromyalgia    GERD (gastroesophageal reflux disease)    Heart murmur    HLD (hyperlipidemia)    HPV in female    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   Hypertension    Hypothyroidism    IBS (irritable bowel syndrome)    Mitral valve prolapse    Peptic ulcer    Personal history of malignant neoplasm of breast    Sleep apnea treated with continuous positive airway pressure (CPAP)    Past Surgical History:  Procedure Laterality Date   ABDOMINAL HYSTERECTOMY  2010   fibroids, h/o abnormal pap smears   AXILLARY SENTINEL NODE BIOPSY Right 07/08/2020   Procedure: RIGHT AXILLARY SENTINEL NODE BIOPSY;  Surgeon: Emelia Loron, MD;  Location: [facility] OR;   Service: General;  Laterality: Right;   BREAST RECONSTRUCTION WITH PLACEMENT OF TISSUE EXPANDER AND FLEX HD (ACELLULAR HYDRATED DERMIS) Bilateral 07/08/2020   Procedure: BILATERAL BREAST RECONSTRUCTION WITH  FLEX HD (ACELLULAR HYDRATED DERMIS),DIRECT IMPLANT RECONSTRUCTION;  Surgeon: Allena Napoleon, MD;  Location: [facility] OR;  Service: Plastics;  Laterality: Bilateral;   BREAST SURGERY Right 2015   right lumpectomy with reduction and lift   COLONOSCOPY     DENTAL SURGERY     ESOPHAGOGASTRODUODENOSCOPY     LAPAROSCOPIC GASTRIC SLEEVE RESECTION  2014   LAPAROSCOPIC OOPHERECTOMY Bilateral 2016   REPLACEMENT TOTAL KNEE Left 2018   also had 3 prior arthroscopies   TOE SURGERY Right    dislocated toe   TOTAL HIP ARTHROPLASTY Right 11/19/2020   Procedure: RIGHT TOTAL HIP ARTHROPLASTY ANTERIOR APPROACH;  Surgeon: Kathryne Hitch, MD;  Location: [facility] OR;  Service: Orthopedics;  Laterality: Right;   TOTAL KNEE ARTHROPLASTY Right 01/09/2022   Procedure: RIGHT TOTAL KNEE ARTHROPLASTY;  Surgeon: Kathryne Hitch, MD;  Location: [facility];  Service: Orthopedics;  Laterality: Right;   TOTAL MASTECTOMY Bilateral 07/08/2020   Procedure: BILATERAL TOTAL MASTECTOMY;  Surgeon: Emelia Loron, MD;  Location: [facility] OR;  Service: General;  Laterality: Bilateral;   VENA CAVA FILTER PLACEMENT  2017   Patient Active Problem List   Diagnosis Date Noted   Status post total right knee replacement 01/09/2022   Obesity (BMI 30-39.9) 06/23/2021   Iron deficiency anemia 01/23/2021   Status post right hip replacement 12/03/2020   Status post total replacement of right hip 11/19/2020   Breast cancer (HCC) 07/08/2020   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   H/O total hysterectomy 06/11/2020   Genetic testing 05/27/2020   Chronic superficial gastritis 05/27/2020   Fibromyalgia 05/27/2020   GERD (gastroesophageal reflux disease) 05/27/2020   Prediabetes 05/27/2020   Complex regional pain syndrome I, unspecified  05/27/2020   Atypical lobular hyperplasia [facility]) of left breast 05/23/2020   History of therapeutic radiation 05/23/2020   Family history of breast cancer    Family history of colon cancer    Personal history of malignant neoplasm of breast    Unilateral primary osteoarthritis, right knee 02/26/2020   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   DVT (deep venous thrombosis) (HCC) 05/04/2018   Malignant neoplasm of upper-outer quadrant of right breast in female, estrogen receptor negative (HCC) 05/03/2018   Obstructive sleep apnea (adult) (pediatric) 09/14/2017   Adenopathy 07/15/2017   B12 deficiency 07/15/2017   Disorder of tympanic membrane of right ear 07/15/2017   Recurrent UTI 03/09/2017   Bladder pain 02/25/2017   Proteinuria 02/25/2017   Hematoma of thigh, left, subsequent encounter 08/16/2016   At risk for obstructive sleep apnea 07/01/2016   Suspected sleep apnea 07/01/2016   Arthritis of left knee 05/26/2016   Localized primary osteoarthritis of lower leg, left 05/26/2016   Clotting disorder (HCC) 05/22/2016   Chronic anticoagulation 01/06/2016   Right-sided epistaxis 01/06/2016   Urinary tract infection, site not specified 08/12/2015   Abnormal Pap smear of vagina 05/06/2015   Follow-up examination, following other surgery 12/14/2013   Pelvic and perineal pain 11/28/2013   Anxiety and depression 11/16/2013   H/O bariatric surgery 11/16/2013   Atrophic vaginitis 11/13/2013   Tear of lateral cartilage or meniscus of knee, current 04/14/2013   Allergic rhinitis 01/16/2013   Female genital symptoms 11/10/2012   Symptomatic menopausal or female climacteric states 11/10/2012   Disequilibrium 06/09/2012   Morbid obesity (HCC) 02/09/2012   Vitamin D deficiency 09/13/2008   Preop examination 04/20/2008   Hypothyroidism 05/20/2007    PCP: Georgann Housekeeper, MD  REFERRING PROVIDER: Richardean Canal, PA-C  REFERRING DIAG: s/p Rt TKA  THERAPY DIAG:  Stiffness of right knee, not elsewhere  classified  Acute pain of right knee  Localized edema  Unsteadiness on feet  Rationale for Evaluation and Treatment: Rehabilitation  ONSET DATE: 01/09/22  SUBJECTIVE:   SUBJECTIVE STATEMENT: A little stiff and sore, I didn't do my AM exercises and that probably wasn't good to do.   PERTINENT HISTORY: Rt THA 2022, Rt TKA 01/09/22, Lt TKA 2018, Hx of Breast Cancer s/p double mastectomy with sentinel lymph node removed from right side  PAIN:  Are you having pain? Yes Reports 5-6/10 pain in right knee  PRECAUTIONS: Knee, s/p bilateral mastectomy  WEIGHT BEARING RESTRICTIONS: No  FALLS:  Has patient fallen in last 6 months? No  LIVING ENVIRONMENT: Lives with: lives alone Lives in: House/apartment Stairs: Yes: Internal: 1 flight steps; handrails both sides Has following equipment at home: walker and cane  OCCUPATION:   PLOF: Independent  PATIENT GOALS: be able to get upstairs to shower  NEXT MD VISIT: 03/02/22  OBJECTIVE:   DIAGNOSTIC FINDINGS:   PATIENT SURVEYS:  Eval:  FOTO 39, goal 61  COGNITION: Overall cognitive status: Within functional limits for tasks assessed     SENSATION: Not tested   POSTURE: weight shift left  PALPATION: Scar adhesions noted Rt surgical incision   LOWER EXTREMITY ROM: Eval: Right knee flexion 18-75 Left knee is [facility]  02/09/2022: Right knee flexion 91 degrees in supine  02/11/2022: Supine A/ROM Right knee:  0 - 93 deg Seated A/ROM Right knee:  5 - 82 deg Seated left knee A/ROM 0-123 deg  02/13/2022: Seated A/ROM Right knee:  5 - 95 deg  02/16/22: Knee flexion: 106 in supine  02/23/22:  Knee flexion: 110 degrees F supine  02/25/2022: Seated right knee A/ROM: 0 -108 deg Supine right knee A/ROM: 0 -110 deg Supine right knee AA/ROM with strap:  0-112 deg Supine right knee 114 degrees active  LOWER EXTREMITY MMT:  MMT Right eval Left eval  Hip flexion 3 5   Hip extension    Hip abduction 3+   Hip adduction     Hip internal rotation    Hip external rotation    Knee flexion    Knee extension    Ankle dorsiflexion    Ankle plantarflexion    Ankle inversion    Ankle eversion     (Blank rows = not tested)  LOWER EXTREMITY SPECIAL TESTS:    FUNCTIONAL TESTS:  Eval: 5 times sit to stand: 13 sec, 2 HHA, weight shifted Lt Timed up and go (TUG): 16 sec, using RW  02/18/2022: 3 minute walk test:  341 ft with RW  GAIT: Distance walked: through clinic to treatment room and gym Assistive device utilized: Environmental consultant - 2 wheeled Level of assistance: Modified independence Comments: Rt LE decreased knee flexion/hip extension during swing phase and minimal PF noted during pushoff   TODAY'S TREATMENT:     03/09/22: Nustep L3 8 min with focus on pushing with quad Leg press (seat 7) 85# 2x10, Bil RTLE 30# 2x10 Reverse walking with cable 10x VC to contract glutes #15 Step ups 2x10 forward, then lateral 2x10 1 hand rail used Prone ham curls 2# 2x10 LAQ 5# 2x10  Game Ready to right knee:  3 snowflakes, medium compression, x10 min   03/06/22: Recumbent bike full forward AROM level 1x 8 minutes with PTA present to discuss status Leg Press (seat at 7) 80# 2x10 Bil RTLE 30# 2x10 Seated LAQ with 4# 2x10 RLE 2-3 sec hold Reverse walking holding cable 10x VC to contract glutes Up and down stairs in clinic reciprocally with 1 hand rail Prone hams curls 20x 0#  Game Ready to right knee:  3 snowflakes, medium compression, x10 min  DATE: 03/04/2022 Recumbent bike level 1 x6 minutes with PT present to discuss status Standing at stairs performing right knee flexion 2x10 (with 2 sec hold) FWD step ups with UE support with 6" step x10 bilat Sit stand holding 5# KB: used purple pad x10 Sit to/from stand with left LE outstretched to encourage increased weight bearing through RLE x10 Seated LAQ with 4# 2x10 RLE Seated heel slides with foot on slider 2x10 Standing at barre:  Mini squats x10 Leg Press (seat at 7) 80#  2x10 Game Ready to right knee:  3 snowflakes, medium compression, x10 min   PATIENT EDUCATION:  Education details: eval findings/POC; importance of gaining knee extension for pain/swelling/mobility; HEP Person educated: Patient Education method: Explanation, Verbal cues, and  Handouts Education comprehension: verbalized understanding and returned demonstration  HOME EXERCISE PROGRAM: Access Code: [email or web address] URL: [email or web address] Date: 03/04/2022 Prepared by: Reather Laurence  Exercises - Active Straight Leg Raise with Quad Set  - 1 x daily - 7 x weekly - 2 sets - 10 reps - Supine Heel Slide with Strap  - 1 x daily - 7 x weekly - 2 sets - 10 reps - Seated Knee Flexion AAROM  - 3 x daily - 7 x weekly - 30 seconds hold - Seated Long Arc Quad  - 3 x daily - 7 x weekly - 1 sets - 10 reps - 5 hold - Standing Marching  - 1-2 x daily - 7 x weekly - 1 sets - 10 reps - Standing Knee Flexion AROM with Chair Support  - 1 x daily - 7 x weekly - 2 sets - 10 reps - Standing Hip Abduction with Counter Support  - 1 x daily - 7 x weekly - 2 sets - 10 reps - Standing Hamstring Stretch with Step  - 1 x daily - 7 x weekly - 1 sets - 2 reps - 20 sec hold - Mini Squat with Counter Support  - 1 x daily - 7 x weekly - 2 sets - 10 reps  ASSESSMENT:  CLINICAL IMPRESSION: Pt arrives to PT a little stiff and sore in her knee. This did not affect her ability to exercise today. Small increases in load/resistance in exercises to increase strength today.   OBJECTIVE IMPAIRMENTS: Abnormal gait, decreased activity tolerance, decreased balance, decreased endurance, decreased mobility, difficulty walking, decreased ROM, decreased strength, hypomobility, increased edema, increased fascial restrictions, impaired flexibility, postural dysfunction, and pain.   ACTIVITY LIMITATIONS: standing, squatting, sleeping, stairs, transfers, bed mobility, bathing, dressing, hygiene/grooming, and locomotion  level  PARTICIPATION LIMITATIONS: cleaning, laundry, driving, shopping, community activity, and yard work  PERSONAL FACTORS: Age, Fitness, Past/current experiences, Time since onset of injury/illness/exacerbation, and 3+ comorbidities: Rt THA, Lt TKA, DM, anxiety  are also affecting patient's functional outcome.   REHAB POTENTIAL: Good  CLINICAL DECISION MAKING: Evolving/moderate complexity  EVALUATION COMPLEXITY: Moderate   GOALS: Goals reviewed with patient? Yes  SHORT TERM GOALS: Target date: 02/18/22 Pt will be independent with initial HEP to improve ROM and mobility. Baseline: Goal status: Goal met 02/09/21  2.  Pt will have atleast 90 deg of Rt knee flexion Baseline:  Goal status:Goal met 02/09/21  3.  Pt will have Rt knee extension to 0 deg.  Baseline:  Goal status: MET on 02/18/2022    LONG TERM GOALS: Target date: 04/01/22  Pt will have 120 deg Rt knee flexion and 0 deg Rt knee extension Baseline: 75 deg flexion, 18 deg ext Goal status: IN PROGRESS (see above)  2.  Pt will report atleast 50% improvement in Rt knee pain with ADLs. Baseline:  Goal status: INITIAL  3.  Pt will be able to ambulate with LRAD atleast 155ft with minimal compensations in her gait pattern.  Baseline:  Goal status: IN PROGRESS  4.  Pt will be able to ascend and descend 1 flight of stairs with/without handrails and step over pattern to allow her to get upstairs to her shower. Baseline:  Goal status: IN PROGRESS  5.  Pt will have 5/5 Rt hip/knee strength to improver her mechanics and efficiency with daily activity.  Baseline:  Goal status: INITIAL    PLAN:  PT FREQUENCY:  3x/week for 4 weeks, then decrease to 1-2x/week for 4 weeks   PT  DURATION: 8 weeks  PLANNED INTERVENTIONS: Therapeutic exercises, Therapeutic activity, Neuromuscular re-education, Balance training, Gait training, Patient/Family education, Self Care, Joint mobilization, Cryotherapy, Manual therapy, and  Re-evaluation  PLAN FOR NEXT SESSION: Assess and progress HEP as indicated, ROM and remeasure A/ROM, strengthening, progress ambulation and standing exercises  Ane Payment, PTA 03/09/22 3:21 PM   [facility] [address], [address] [place], [place] [address] Phone # [phone] Fax [phone]

## 2022-03-09 ENCOUNTER — Encounter: Payer: Self-pay | Admitting: Physical Therapy

## 2022-03-09 ENCOUNTER — Ambulatory Visit: Payer: 59 | Admitting: Physical Therapy

## 2022-03-09 DIAGNOSIS — R2681 Unsteadiness on feet: Secondary | ICD-10-CM

## 2022-03-09 DIAGNOSIS — M25561 Pain in right knee: Secondary | ICD-10-CM

## 2022-03-09 DIAGNOSIS — R600 Localized edema: Secondary | ICD-10-CM

## 2022-03-09 DIAGNOSIS — M25661 Stiffness of right knee, not elsewhere classified: Secondary | ICD-10-CM

## 2022-03-10 ENCOUNTER — Ambulatory Visit: Payer: 59 | Admitting: Hematology and Oncology

## 2022-03-12 ENCOUNTER — Ambulatory Visit: Payer: 59 | Admitting: Rehabilitative and Restorative Service Providers"

## 2022-03-12 ENCOUNTER — Encounter: Payer: Self-pay | Admitting: Rehabilitative and Restorative Service Providers"

## 2022-03-12 DIAGNOSIS — M25661 Stiffness of right knee, not elsewhere classified: Secondary | ICD-10-CM

## 2022-03-12 DIAGNOSIS — M25561 Pain in right knee: Secondary | ICD-10-CM | POA: Diagnosis not present

## 2022-03-12 DIAGNOSIS — R2681 Unsteadiness on feet: Secondary | ICD-10-CM

## 2022-03-12 DIAGNOSIS — R600 Localized edema: Secondary | ICD-10-CM

## 2022-03-12 NOTE — Therapy (Signed)
OUTPATIENT PHYSICAL THERAPY TREATMENT NOTE   Patient Name: Sierra Perez MRN: [id number] DOB:1960-12-21, 62 y.o., female Today's Date: 03/12/2022  END OF SESSION:  PT End of Session - 03/12/22 0942     Visit Number 14    Date for PT Re-Evaluation 04/01/22    Authorization Type [employer] - Visit Number 24    Authorization - Number of Visits 23    PT Start Time 0930    PT Stop Time 1020    PT Time Calculation (min) 50 min    Activity Tolerance Patient tolerated treatment well    Behavior During Therapy WFL for tasks assessed/performed                Past Medical History:  Diagnosis Date   Anemia    Anxiety    Arthritis    Asthma    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, her-2 positive, ER/PR negative   Clotting disorder ([place])    testing was negative, h/o DVT while on treatment   Colon polyps    Complex regional pain syndrome i of right lower limb    RDS   Complication of anesthesia    Cystitis    Diabetes mellitus without complication ([place])    DVT (deep venous thrombosis) ([place])    started in 2015, multiple   Family history of breast cancer    Family history of colon cancer    Fibroid    Fibromyalgia    GERD (gastroesophageal reflux disease)    Heart murmur    HLD (hyperlipidemia)    HPV in female    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   Hypertension    Hypothyroidism    IBS (irritable bowel syndrome)    Mitral valve prolapse    Peptic ulcer    Personal history of malignant neoplasm of breast    Sleep apnea treated with continuous positive airway pressure (CPAP)    Past Surgical History:  Procedure Laterality Date   ABDOMINAL HYSTERECTOMY  2010   fibroids, h/o abnormal pap smears   AXILLARY SENTINEL NODE BIOPSY Right 07/08/2020   Procedure: RIGHT AXILLARY SENTINEL NODE BIOPSY;  Surgeon: Rolm Bookbinder, MD;  Location: [facility];  Service: General;  Laterality: Right;   BREAST RECONSTRUCTION WITH  PLACEMENT OF TISSUE EXPANDER AND FLEX HD (ACELLULAR HYDRATED DERMIS) Bilateral 07/08/2020   Procedure: BILATERAL BREAST RECONSTRUCTION WITH  FLEX HD (ACELLULAR HYDRATED DERMIS),DIRECT IMPLANT RECONSTRUCTION;  Surgeon: Cindra Presume, MD;  Location: [facility];  Service: Plastics;  Laterality: Bilateral;   BREAST SURGERY Right 2015   right lumpectomy with reduction and lift   COLONOSCOPY     DENTAL SURGERY     ESOPHAGOGASTRODUODENOSCOPY     LAPAROSCOPIC GASTRIC SLEEVE RESECTION  2014   LAPAROSCOPIC OOPHERECTOMY Bilateral 2016   REPLACEMENT TOTAL KNEE Left 2018   also had 3 prior arthroscopies   TOE SURGERY Right    dislocated toe   TOTAL HIP ARTHROPLASTY Right 11/19/2020   Procedure: RIGHT TOTAL HIP ARTHROPLASTY ANTERIOR APPROACH;  Surgeon: Mcarthur Rossetti, MD;  Location: [facility];  Service: Orthopedics;  Laterality: Right;   TOTAL KNEE ARTHROPLASTY Right 01/09/2022   Procedure: RIGHT TOTAL KNEE ARTHROPLASTY;  Surgeon: Mcarthur Rossetti, MD;  Location: [facility];  Service: Orthopedics;  Laterality: Right;   TOTAL MASTECTOMY Bilateral 07/08/2020   Procedure: BILATERAL TOTAL MASTECTOMY;  Surgeon: Rolm Bookbinder, MD;  Location: [facility];  Service: General;  Laterality: Bilateral;  VENA CAVA FILTER PLACEMENT  2017   Patient Active Problem List   Diagnosis Date Noted   Status post total right knee replacement 01/09/2022   Obesity (BMI 30-39.9) 06/23/2021   Iron deficiency anemia 01/23/2021   Status post right hip replacement 12/03/2020   Status post total replacement of right hip 11/19/2020   Breast cancer ([place]) 07/08/2020   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   H/O total hysterectomy 06/11/2020   Genetic testing 05/27/2020   Chronic superficial gastritis 05/27/2020   Fibromyalgia 05/27/2020   GERD (gastroesophageal reflux disease) 05/27/2020   Prediabetes 05/27/2020   Complex regional pain syndrome I, unspecified 05/27/2020   Atypical lobular hyperplasia [facility]) of left breast  05/23/2020   History of therapeutic radiation 05/23/2020   Family history of breast cancer    Family history of colon cancer    Personal history of malignant neoplasm of breast    Unilateral primary osteoarthritis, right knee 02/26/2020   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   DVT (deep venous thrombosis) ([place]) 05/04/2018   Malignant neoplasm of upper-outer quadrant of right breast in female, estrogen receptor negative ([place]) 05/03/2018   Obstructive sleep apnea (adult) (pediatric) 09/14/2017   Adenopathy 07/15/2017   B12 deficiency 07/15/2017   Disorder of tympanic membrane of right ear 07/15/2017   Recurrent UTI 03/09/2017   Bladder pain 02/25/2017   Proteinuria 02/25/2017   Hematoma of thigh, left, subsequent encounter 08/16/2016   At risk for obstructive sleep apnea 07/01/2016   Suspected sleep apnea 07/01/2016   Arthritis of left knee 05/26/2016   Localized primary osteoarthritis of lower leg, left 05/26/2016   Clotting disorder ([place]) 05/22/2016   Chronic anticoagulation 01/06/2016   Right-sided epistaxis 01/06/2016   Urinary tract infection, site not specified 08/12/2015   Abnormal Pap smear of vagina 05/06/2015   Follow-up examination, following other surgery 12/14/2013   Pelvic and perineal pain 11/28/2013   Anxiety and depression 11/16/2013   H/O bariatric surgery 11/16/2013   Atrophic vaginitis 11/13/2013   Tear of lateral cartilage or meniscus of knee, current 04/14/2013   Allergic rhinitis 01/16/2013   Female genital symptoms 11/10/2012   Symptomatic menopausal or female climacteric states 11/10/2012   Disequilibrium 06/09/2012   Morbid obesity ([place]) 02/09/2012   Vitamin D deficiency 09/13/2008   Preop examination 04/20/2008   Hypothyroidism 05/20/2007    PCP: Wenda Low, MD  REFERRING PROVIDER: Erskine Emery, PA-C  REFERRING DIAG: s/p Rt TKA  THERAPY DIAG:  Stiffness of right knee, not elsewhere classified  Acute pain of right knee  Localized  edema  Unsteadiness on feet  Rationale for Evaluation and Treatment: Rehabilitation  ONSET DATE: 01/09/22  SUBJECTIVE:   SUBJECTIVE STATEMENT: A little stiff and sore, I didn't do my AM exercises and that probably wasn't good to do.   PERTINENT HISTORY: Rt THA 2022, Rt TKA 01/09/22, Lt TKA 2018, Hx of Breast Cancer s/p double mastectomy with sentinel lymph node removed from right side  PAIN:  Are you having pain? Yes Reports 5-6/10 pain in right knee  PRECAUTIONS: Knee, s/p bilateral mastectomy  WEIGHT BEARING RESTRICTIONS: No  FALLS:  Has patient fallen in last 6 months? No  LIVING ENVIRONMENT: Lives with: lives alone Lives in: House/apartment Stairs: Yes: Internal: 1 flight steps; handrails both sides Has following equipment at home: walker and cane  OCCUPATION:   PLOF: Independent  PATIENT GOALS: be able to get upstairs to shower   NEXT MD VISIT: 03/02/22  OBJECTIVE:   DIAGNOSTIC FINDINGS:   PATIENT SURVEYS:  Eval:  FOTO 39, goal 74  COGNITION: Overall cognitive status: Within functional limits for tasks assessed     SENSATION: Not tested   POSTURE: weight shift left  PALPATION: Scar adhesions noted Rt surgical incision   LOWER EXTREMITY ROM: Eval: Right knee flexion 18-75 Left knee is [facility]  02/09/2022: Right knee flexion 91 degrees in supine  02/11/2022: Supine A/ROM Right knee:  0 - 93 deg Seated A/ROM Right knee:  5 - 82 deg Seated left knee A/ROM 0-123 deg  02/13/2022: Seated A/ROM Right knee:  5 - 95 deg  02/16/22: Knee flexion: 106 in supine  02/23/22:  Knee flexion: 110 degrees F supine  02/25/2022: Seated right knee A/ROM: 0 -108 deg Supine right knee A/ROM: 0 -110 deg Supine right knee AA/ROM with strap:  0-112 deg Supine right knee 114 degrees active  03/12/2022: Supine right knee 0-114 degrees A/ROM  LOWER EXTREMITY MMT:  MMT Right eval Left eval  Hip flexion 3 5   Hip extension    Hip abduction 3+   Hip adduction     Hip internal rotation    Hip external rotation    Knee flexion    Knee extension    Ankle dorsiflexion    Ankle plantarflexion    Ankle inversion    Ankle eversion     (Blank rows = not tested)  LOWER EXTREMITY SPECIAL TESTS:    FUNCTIONAL TESTS:  Eval: 5 times sit to stand: 13 sec, 2 HHA, weight shifted Lt Timed up and go (TUG): 16 sec, using RW  02/18/2022: 3 minute walk test:  341 ft with RW  GAIT: Distance walked: through clinic to treatment room and gym Assistive device utilized: Environmental consultant - 2 wheeled Level of assistance: Modified independence Comments: Rt LE decreased knee flexion/hip extension during swing phase and minimal PF noted during pushoff   TODAY'S TREATMENT:     DATE: 03/12/2022 Recumbent bike full forward AROM level 1x 8 minutes with PT present to discuss status Seated heel slides with foot on slider 2x10 Seated LAQ 5# 2x10 RLE only Standing knee flexion with RLE on 2nd step of stairwell 2x10 Reverse walking with cable 10x VC to contract glutes #15 Prone ham curls 3# 2x10 Supine straight leg raise with 3# 2x10 Leg press (seat 7) 90# 2x10, Bil RTLE 40# 2x10 Game Ready to right knee:  3 snowflakes, medium compression, x10 min   03/09/22: Nustep L3 8 min with focus on pushing with quad Leg press (seat 7) 85# 2x10, Bil RTLE 30# 2x10 Reverse walking with cable 10x VC to contract glutes #15 Step ups 2x10 forward, then lateral 2x10 1 hand rail used Prone ham curls 2# 2x10 LAQ 5# 2x10  Game Ready to right knee:  3 snowflakes, medium compression, x10 min   03/06/22: Recumbent bike full forward AROM level 1x 8 minutes with PTA present to discuss status Leg Press (seat at 7) 80# 2x10 Bil RTLE 30# 2x10 Seated LAQ with 4# 2x10 RLE 2-3 sec hold Reverse walking holding cable 10x VC to contract glutes Up and down stairs in clinic reciprocally with 1 hand rail Prone hams curls 20x 0#  Game Ready to right knee:  3 snowflakes, medium compression, x10  min    PATIENT EDUCATION:  Education details: eval findings/POC; importance of gaining knee extension for pain/swelling/mobility; HEP Person educated: Patient Education method: Explanation, Verbal cues, and Handouts Education comprehension: verbalized understanding and returned demonstration  HOME EXERCISE PROGRAM: Access Code: [facility] URL: [email or web address] Date: 03/04/2022 Prepared by: Shelby Dubin  Carleton [place]  Exercises - Active Straight Leg Raise with Quad Set  - 1 x daily - 7 x weekly - 2 sets - 10 reps - Supine Heel Slide with Strap  - 1 x daily - 7 x weekly - 2 sets - 10 reps - Seated Knee Flexion AAROM  - 3 x daily - 7 x weekly - 30 seconds hold - Seated Long Arc Quad  - 3 x daily - 7 x weekly - 1 sets - 10 reps - 5 hold - Standing Marching  - 1-2 x daily - 7 x weekly - 1 sets - 10 reps - Standing Knee Flexion AROM with Chair Support  - 1 x daily - 7 x weekly - 2 sets - 10 reps - Standing Hip Abduction with Counter Support  - 1 x daily - 7 x weekly - 2 sets - 10 reps - Standing Hamstring Stretch with Step  - 1 x daily - 7 x weekly - 1 sets - 2 reps - 20 sec hold - Mini Squat with Counter Support  - 1 x daily - 7 x weekly - 2 sets - 10 reps  ASSESSMENT:  CLINICAL IMPRESSION:  Ms Schwalbe presents to skilled PT with continued reports of increased tightness in the morning.  Patient reports that her pain with typical daily activities is at least 50% better than at initial evaluation.  Patient continues to be compliant with HEP.  Patient able to progress with increased weight throughout session.  Patient has been able to maintain 0-114 right knee A/ROM.  Patient continues to progress towards strengthening and increased ROM.   OBJECTIVE IMPAIRMENTS: Abnormal gait, decreased activity tolerance, decreased balance, decreased endurance, decreased mobility, difficulty walking, decreased ROM, decreased strength, hypomobility, increased edema, increased fascial restrictions,  impaired flexibility, postural dysfunction, and pain.   ACTIVITY LIMITATIONS: standing, squatting, sleeping, stairs, transfers, bed mobility, bathing, dressing, hygiene/grooming, and locomotion level  PARTICIPATION LIMITATIONS: cleaning, laundry, driving, shopping, community activity, and yard work  PERSONAL FACTORS: Age, Fitness, Past/current experiences, Time since onset of injury/illness/exacerbation, and 3+ comorbidities: Rt THA, Lt TKA, DM, anxiety  are also affecting patient's functional outcome.   REHAB POTENTIAL: Good  CLINICAL DECISION MAKING: Evolving/moderate complexity  EVALUATION COMPLEXITY: Moderate   GOALS: Goals reviewed with patient? Yes  SHORT TERM GOALS: Target date: 02/18/22 Pt will be independent with initial HEP to improve ROM and mobility. Baseline: Goal status: Goal met 02/09/21  2.  Pt will have atleast 90 deg of Rt knee flexion Baseline:  Goal status:Goal met 02/09/21  3.  Pt will have Rt knee extension to 0 deg.  Baseline:  Goal status: MET on 02/18/2022    LONG TERM GOALS: Target date: 04/01/22  Pt will have 120 deg Rt knee flexion and 0 deg Rt knee extension Baseline: 75 deg flexion, 18 deg ext Goal status: IN PROGRESS (see above)  2.  Pt will report atleast 50% improvement in Rt knee pain with ADLs. Baseline:  Goal status: MET on 03/12/2022  3.  Pt will be able to ambulate with LRAD atleast 139f with minimal compensations in her gait pattern.  Baseline:  Goal status: IN PROGRESS  4.  Pt will be able to ascend and descend 1 flight of stairs with/without handrails and step over pattern to allow her to get upstairs to her shower. Baseline:  Goal status: IN PROGRESS  5.  Pt will have 5/5 Rt hip/knee strength to improver her mechanics and efficiency with daily activity.  Baseline:  Goal status:  INITIAL    PLAN:  PT FREQUENCY:  3x/week for 4 weeks, then decrease to 1-2x/week for 4 weeks   PT DURATION: 8 weeks  PLANNED INTERVENTIONS:  Therapeutic exercises, Therapeutic activity, Neuromuscular re-education, Balance training, Gait training, Patient/Family education, Self Care, Joint mobilization, Cryotherapy, Manual therapy, and Re-evaluation  PLAN FOR NEXT SESSION: Assess and progress HEP as indicated, ROM and remeasure A/ROM, strengthening, progress ambulation and standing exercises  Juel Burrow, PT 03/12/22 10:22 AM   [facility] [address] [place], [place] [address] Phone # [phone] Fax [phone]

## 2022-03-13 ENCOUNTER — Encounter: Payer: Self-pay | Admitting: Hematology and Oncology

## 2022-03-13 ENCOUNTER — Encounter: Payer: Self-pay | Admitting: Plastic Surgery

## 2022-03-13 ENCOUNTER — Other Ambulatory Visit: Payer: Self-pay | Admitting: *Deleted

## 2022-03-13 ENCOUNTER — Telehealth: Payer: Self-pay

## 2022-03-13 ENCOUNTER — Ambulatory Visit: Payer: 59 | Admitting: Plastic Surgery

## 2022-03-13 DIAGNOSIS — C50911 Malignant neoplasm of unspecified site of right female breast: Secondary | ICD-10-CM

## 2022-03-13 DIAGNOSIS — Z171 Estrogen receptor negative status [ER-]: Secondary | ICD-10-CM

## 2022-03-13 DIAGNOSIS — N6092 Unspecified benign mammary dysplasia of left breast: Secondary | ICD-10-CM

## 2022-03-13 DIAGNOSIS — Z853 Personal history of malignant neoplasm of breast: Secondary | ICD-10-CM

## 2022-03-13 DIAGNOSIS — C50411 Malignant neoplasm of upper-outer quadrant of right female breast: Secondary | ICD-10-CM

## 2022-03-13 DIAGNOSIS — Z86718 Personal history of other venous thrombosis and embolism: Secondary | ICD-10-CM

## 2022-03-13 DIAGNOSIS — I82599 Chronic embolism and thrombosis of other specified deep vein of unspecified lower extremity: Secondary | ICD-10-CM

## 2022-03-13 DIAGNOSIS — D689 Coagulation defect, unspecified: Secondary | ICD-10-CM

## 2022-03-13 DIAGNOSIS — F419 Anxiety disorder, unspecified: Secondary | ICD-10-CM

## 2022-03-13 NOTE — Progress Notes (Signed)
Per Dr. Lindi Adie ordered right diag MM and [place] for palpable mass to right axilla

## 2022-03-13 NOTE — Progress Notes (Signed)
Patient ID: Sierra Perez, female    DOB: [birth year], 63 y.o.   MRN: [id number]   Chief Complaint  Patient presents with   Advice Only   Breast Problem    The patient is a 62 year old female here for consultation for breast reconstruction revision.  She was diagnosed with breast cancer on the right side approximately 10 years ago she underwent a right-sided lumpectomy with radiation.  At the same time she also had a left-sided mastopexy reduction for symmetry.  She was then diagnosed with breast cancer in May 2022 and opted for bilateral mastectomies with reconstruction.  She underwent bilateral mastectomies with prepack immediate implant placement reconstruction in June 2022.  Originally she wanted free flap reconstruction but was denied due to her clotting disorder. Her past medical history is positive for anxiety, breast cancer, clotting disorder, DVT, thyroid disease and fibroids.  She has undergone an abdominal hysterectomy, foot surgery, gastric sleeve surgery, a vena cava filter and a left total knee replacement. She is 5 feet 8 inches tall and weighs around 235 pounds.  She is here today because the right breast is tight and seems to be getting tighter which is likely related to the radiation.  The left breast is sagging and she has not been wearing a bra.  The implants look to be more narrow than her chest wall.  She has some excess fat in the mid axillary area of the lateral breast on each side.  She currently has 440 cc Mentor moderate profile Xtra implants in place    Review of Systems  Constitutional: Negative.   HENT: Negative.    Eyes: Negative.   Respiratory: Negative.  Negative for chest tightness.   Cardiovascular: Negative.   Gastrointestinal: Negative.   Endocrine: Negative.   Genitourinary: Negative.   Musculoskeletal: Negative.     Past Medical History:  Diagnosis Date   Anemia    Anxiety    Arthritis    Asthma    Breast cancer ([place]) 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,  her-2 positive, ER/PR negative   Clotting disorder ([place])    testing was negative, h/o DVT while on treatment   Colon polyps    Complex regional pain syndrome i of right lower limb    RDS   Complication of anesthesia    Cystitis    Diabetes mellitus without complication ([place])    DVT (deep venous thrombosis) ([place])    started in 2015, multiple   Family history of breast cancer    Family history of colon cancer    Fibroid    Fibromyalgia    GERD (gastroesophageal reflux disease)    Heart murmur    HLD (hyperlipidemia)    HPV in female    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   Hypertension    Hypothyroidism    IBS (irritable bowel syndrome)    Mitral valve prolapse    Peptic ulcer    Personal history of malignant neoplasm of breast    Sleep apnea treated with continuous positive airway pressure (CPAP)     Past Surgical History:  Procedure Laterality Date   ABDOMINAL HYSTERECTOMY  2010   fibroids, h/o abnormal pap smears   AXILLARY SENTINEL NODE BIOPSY Right 07/08/2020   Procedure: RIGHT AXILLARY SENTINEL NODE BIOPSY;  Surgeon: Rolm Bookbinder, MD;  Location: [facility];  Service: General;  Laterality: Right;   BREAST RECONSTRUCTION WITH PLACEMENT OF TISSUE EXPANDER AND FLEX HD (ACELLULAR HYDRATED DERMIS) Bilateral  07/08/2020   Procedure: BILATERAL BREAST RECONSTRUCTION WITH  FLEX HD (ACELLULAR HYDRATED DERMIS),DIRECT IMPLANT RECONSTRUCTION;  Surgeon: Cindra Presume, MD;  Location: [facility];  Service: Plastics;  Laterality: Bilateral;   BREAST SURGERY Right 2015   right lumpectomy with reduction and lift   COLONOSCOPY     DENTAL SURGERY     ESOPHAGOGASTRODUODENOSCOPY     LAPAROSCOPIC GASTRIC SLEEVE RESECTION  2014   LAPAROSCOPIC OOPHERECTOMY Bilateral 2016   REPLACEMENT TOTAL KNEE Left 2018   also had 3 prior arthroscopies   TOE SURGERY Right    dislocated toe   TOTAL HIP ARTHROPLASTY Right 11/19/2020   Procedure: RIGHT TOTAL HIP ARTHROPLASTY ANTERIOR  APPROACH;  Surgeon: Mcarthur Rossetti, MD;  Location: [facility];  Service: Orthopedics;  Laterality: Right;   TOTAL KNEE ARTHROPLASTY Right 01/09/2022   Procedure: RIGHT TOTAL KNEE ARTHROPLASTY;  Surgeon: Mcarthur Rossetti, MD;  [facility];  Service: Orthopedics;  Laterality: Right;   TOTAL MASTECTOMY Bilateral 07/08/2020   Procedure: BILATERAL TOTAL MASTECTOMY;  Surgeon: Rolm Bookbinder, MD;  Location: [facility];  Service: General;  Laterality: Bilateral;   VENA CAVA FILTER PLACEMENT  2017      Current Outpatient Medications:    acetaminophen (TYLENOL) 500 MG tablet, Take 1,000 mg by mouth every 6 (six) hours as needed for moderate pain., Disp: , Rfl:    albuterol (VENTOLIN HFA) 108 (90 Base) MCG/ACT inhaler, Inhale 1 puff into the lungs every 6 (six) hours as needed for wheezing or shortness of breath., Disp: , Rfl:    amLODipine (NORVASC) 5 MG tablet, Take 5 mg by mouth daily., Disp: , Rfl:    anastrozole (ARIMIDEX) 1 MG tablet, TAKE 1 TABLET(1 MG) BY MOUTH DAILY, Disp: 90 tablet, Rfl: 3   CALCIUM CITRATE PO, Take 2 tablets by mouth daily., Disp: , Rfl:    Cholecalciferol (VITAMIN D-3) 25 MCG (1000 UT) CAPS, Take 1,000 Units by mouth daily., Disp: , Rfl:    cyclobenzaprine (FLEXERIL) 10 MG tablet, Take 1 tablet (10 mg total) by mouth at bedtime., Disp: 30 tablet, Rfl: 0   DULoxetine (CYMBALTA) 30 MG capsule, 30 mg daily., Disp: , Rfl:    DULoxetine (CYMBALTA) 60 MG capsule, Take 60 mg by mouth daily., Disp: , Rfl:    ELIQUIS 5 MG TABS tablet, TAKE 1 TABLET(5 MG) BY MOUTH TWICE DAILY, Disp: 180 tablet, Rfl: 2   fluticasone (FLONASE) 50 MCG/ACT nasal spray, Place 2 sprays into both nostrils daily., Disp: , Rfl:    gabapentin (NEURONTIN) 600 MG tablet, Take 1 tablet (600 mg total) by mouth 3 (three) times daily., Disp: 90 tablet, Rfl: 0   ipratropium (ATROVENT) 0.03 % nasal spray, Place 2 sprays into both nostrils 2 (two) times daily., Disp: , Rfl:    Lactobacillus Rhamnosus, GG,  (CULTURELLE PO), Take 1 capsule by mouth daily., Disp: , Rfl:    levothyroxine (SYNTHROID) 50 MCG tablet, Take 50 mcg by mouth every morning., Disp: , Rfl:    magic mouthwash SOLN, Swish and spit twice daily, Disp: 500 mL, Rfl: 0   NON FORMULARY, Pt uses a cpap nightly, Disp: , Rfl:    ondansetron (ZOFRAN) 4 MG tablet, Take 1 tablet (4 mg total) by mouth every 8 (eight) hours as needed for nausea or vomiting., Disp: 20 tablet, Rfl: 0   pantoprazole (PROTONIX) 40 MG tablet, Take 1 tablet (40 mg total) by mouth daily., Disp:  , Rfl:    rosuvastatin (CRESTOR) 5 MG tablet, Take 1 tablet (5 mg  total) by mouth daily., Disp: 90 tablet, Rfl: 3   tirzepatide (MOUNJARO) 10 MG/0.5ML Pen, Inject 10 mg into the skin once a week., Disp: 2 mL, Rfl: 11   valACYclovir (VALTREX) 500 MG tablet, Take 1 tablet (500 mg total) by mouth daily. Increase to bid x 3 days with symptoms (Patient taking differently: Take 500 mg by mouth daily as needed (outbreaks). Increase to bid x 3 days with symptoms), Disp: 120 tablet, Rfl: 4  Current Facility-Administered Medications:    triamcinolone acetonide (KENALOG) 10 MG/ML injection 10 mg, 10 mg, Other, Once, Bronson Ing, DPM   Objective:   There were no vitals filed for this visit.  Physical Exam Vitals and nursing note reviewed.  Constitutional:      Appearance: Normal appearance.  HENT:     Head: Normocephalic and atraumatic.  Cardiovascular:     Rate and Rhythm: Normal rate.     Pulses: Normal pulses.  Pulmonary:     Effort: Pulmonary effort is normal.  Abdominal:     General: There is no distension.     Palpations: Abdomen is soft.     Tenderness: There is no abdominal tenderness.  Musculoskeletal:        General: No swelling or deformity.  Skin:    General: Skin is warm.     Capillary Refill: Capillary refill takes less than 2 seconds.     Coloration: Skin is not jaundiced.     Findings: No bruising.  Neurological:     Mental Status: She is alert  and oriented to person, place, and time.  Psychiatric:        Mood and Affect: Mood normal.        Behavior: Behavior normal.        Thought Content: Thought content normal.        Judgment: Judgment normal.     Assessment & Plan:  Atypical lobular hyperplasia (ALH) of left breast  Malignant neoplasm of right female breast, unspecified estrogen receptor status, unspecified site of breast (HCC)  Anxiety and depression  Personal history of malignant neoplasm of breast  Malignant neoplasm of upper-outer quadrant of right breast in female, estrogen receptor negative (HCC)  Clotting disorder ([place])  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, unspecified laterality ([place])  We discussed the following options and the patient is going to think things over and we will talk again in a few weeks. The safest option is only bilateral lateral liposuction and bilateral lipo filling the breast for better contour. The next option would be to reinforce the left breast implant so it is not so saggy and to give it a lift. I do not think that lipo filling and implant adjustment should be done at the same time.  She could do liposuction of the lateral breast area at the same time but I would be very cautious to do much of anything on the right side because of the radiation. Patient will think things over and then we will talk again.  Pictures were obtained of the patient and placed in the chart with the patient's or guardian's permission.   [place], DO

## 2022-03-13 NOTE — Telephone Encounter (Signed)
Called Pt regarding MyChart message. Per Bary Castilla via secure chat, Dr. Marla Roe sent Dr. Lindi Adie a message recommending MM and [place]. Dr. Lindi Adie forwarded message to [facility] who placed orders to be done with GI. Pt states she typically goes to [facility] but is ok with GI. Verified MM and [place] scheduled for 2/20 and subsequent MD appt made for 2/23. Patient verbalized understanding.

## 2022-03-16 ENCOUNTER — Ambulatory Visit: Payer: 59 | Admitting: Physical Therapy

## 2022-03-16 ENCOUNTER — Encounter: Payer: Self-pay | Admitting: Physical Therapy

## 2022-03-16 DIAGNOSIS — M25561 Pain in right knee: Secondary | ICD-10-CM

## 2022-03-16 DIAGNOSIS — R2681 Unsteadiness on feet: Secondary | ICD-10-CM

## 2022-03-16 DIAGNOSIS — M25661 Stiffness of right knee, not elsewhere classified: Secondary | ICD-10-CM

## 2022-03-16 NOTE — Therapy (Signed)
OUTPATIENT PHYSICAL THERAPY TREATMENT NOTE   Patient Name: Sierra Perez MRN: [id number] DOB:03-31-1960, 62 y.o., female Today's Date: 03/16/2022  END OF SESSION:  PT End of Session - 03/16/22 1021     Visit Number 15    Number of Visits 6    Date for PT Re-Evaluation 04/01/22    Authorization Type [employer]    Authorization Time Period 02/04/22 to 04/01/22    Authorization - Visit Number 15    Authorization - Number of Visits 23    PT Start Time 1020    PT Stop Time 1115    PT Time Calculation (min) 55 min    Activity Tolerance Patient tolerated treatment well    Behavior During Therapy WFL for tasks assessed/performed                 Past Medical History:  Diagnosis Date   Anemia    Anxiety    Arthritis    Asthma    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, her-2 positive, ER/PR negative   Clotting disorder ([place])    testing was negative, h/o DVT while on treatment   Colon polyps    Complex regional pain syndrome i of right lower limb    RDS   Complication of anesthesia    Cystitis    Diabetes mellitus without complication ([place])    DVT (deep venous thrombosis) ([place])    started in 2015, multiple   Family history of breast cancer    Family history of colon cancer    Fibroid    Fibromyalgia    GERD (gastroesophageal reflux disease)    Heart murmur    HLD (hyperlipidemia)    HPV in female    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   Hypertension    Hypothyroidism    IBS (irritable bowel syndrome)    Mitral valve prolapse    Peptic ulcer    Personal history of malignant neoplasm of breast    Sleep apnea treated with continuous positive airway pressure (CPAP)    Past Surgical History:  Procedure Laterality Date   ABDOMINAL HYSTERECTOMY  2010   fibroids, h/o abnormal pap smears   AXILLARY SENTINEL NODE BIOPSY Right 07/08/2020   Procedure: RIGHT AXILLARY SENTINEL NODE BIOPSY;  Surgeon: Rolm Bookbinder, MD;  Location: [facility];   Service: General;  Laterality: Right;   BREAST RECONSTRUCTION WITH PLACEMENT OF TISSUE EXPANDER AND FLEX HD (ACELLULAR HYDRATED DERMIS) Bilateral 07/08/2020   Procedure: BILATERAL BREAST RECONSTRUCTION WITH  FLEX HD (ACELLULAR HYDRATED DERMIS),DIRECT IMPLANT RECONSTRUCTION;  Surgeon: Cindra Presume, MD;  Location: [place];  Service: Plastics;  Laterality: Bilateral;   BREAST SURGERY Right 2015   right lumpectomy with reduction and lift   COLONOSCOPY     DENTAL SURGERY     ESOPHAGOGASTRODUODENOSCOPY     LAPAROSCOPIC GASTRIC SLEEVE RESECTION  2014   LAPAROSCOPIC OOPHERECTOMY Bilateral 2016   REPLACEMENT TOTAL KNEE Left 2018   also had 3 prior arthroscopies   TOE SURGERY Right    dislocated toe   TOTAL HIP ARTHROPLASTY Right 11/19/2020   Procedure: RIGHT TOTAL HIP ARTHROPLASTY ANTERIOR APPROACH;  Surgeon: Mcarthur Rossetti, MD;  Location: [place];  Service: Orthopedics;  Laterality: Right;   TOTAL KNEE ARTHROPLASTY Right 01/09/2022   Procedure: RIGHT TOTAL KNEE ARTHROPLASTY;  Surgeon: Mcarthur Rossetti, MD;  Location: [facility];  Service: Orthopedics;  Laterality: Right;   TOTAL MASTECTOMY Bilateral 07/08/2020   Procedure: BILATERAL TOTAL  MASTECTOMY;  Surgeon: Rolm Bookbinder, MD;  Location: [place];  Service: General;  Laterality: Bilateral;   VENA CAVA FILTER PLACEMENT  2017   Patient Active Problem List   Diagnosis Date Noted   Status post total right knee replacement 01/09/2022   Obesity (BMI 30-39.9) 06/23/2021   Iron deficiency anemia 01/23/2021   Status post right hip replacement 12/03/2020   Status post total replacement of right hip 11/19/2020   Breast cancer ([place]) 07/08/2020   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   H/O total hysterectomy 06/11/2020   Genetic testing 05/27/2020   Chronic superficial gastritis 05/27/2020   Fibromyalgia 05/27/2020   GERD (gastroesophageal reflux disease) 05/27/2020   Prediabetes 05/27/2020   Complex regional pain syndrome I, unspecified  05/27/2020   Atypical lobular hyperplasia [facility]) of left breast 05/23/2020   History of therapeutic radiation 05/23/2020   Family history of breast cancer    Family history of colon cancer    Personal history of malignant neoplasm of breast    Unilateral primary osteoarthritis, right knee 02/26/2020   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   DVT (deep venous thrombosis) ([place]) 05/04/2018   Malignant neoplasm of upper-outer quadrant of right breast in female, estrogen receptor negative ([place]) 05/03/2018   Obstructive sleep apnea (adult) (pediatric) 09/14/2017   Adenopathy 07/15/2017   B12 deficiency 07/15/2017   Disorder of tympanic membrane of right ear 07/15/2017   Recurrent UTI 03/09/2017   Bladder pain 02/25/2017   Proteinuria 02/25/2017   Hematoma of thigh, left, subsequent encounter 08/16/2016   At risk for obstructive sleep apnea 07/01/2016   Suspected sleep apnea 07/01/2016   Arthritis of left knee 05/26/2016   Localized primary osteoarthritis of lower leg, left 05/26/2016   Clotting disorder ([place]) 05/22/2016   Chronic anticoagulation 01/06/2016   Right-sided epistaxis 01/06/2016   Urinary tract infection, site not specified 08/12/2015   Abnormal Pap smear of vagina 05/06/2015   Follow-up examination, following other surgery 12/14/2013   Pelvic and perineal pain 11/28/2013   Anxiety and depression 11/16/2013   H/O bariatric surgery 11/16/2013   Atrophic vaginitis 11/13/2013   Tear of lateral cartilage or meniscus of knee, current 04/14/2013   Allergic rhinitis 01/16/2013   Female genital symptoms 11/10/2012   Symptomatic menopausal or female climacteric states 11/10/2012   Disequilibrium 06/09/2012   Morbid obesity ([place]) 02/09/2012   Vitamin D deficiency 09/13/2008   Preop examination 04/20/2008   Hypothyroidism 05/20/2007    PCP: Wenda Low, MD  REFERRING PROVIDER: Erskine Emery, PA-C  REFERRING DIAG: s/p Rt TKA  THERAPY DIAG:  Stiffness of right knee, not elsewhere  classified  Acute pain of right knee  Unsteadiness on feet  Rationale for Evaluation and Treatment: Rehabilitation  ONSET DATE: 01/09/22  SUBJECTIVE:   SUBJECTIVE STATEMENT: Doing well.I was planning on getting into the pool this weekend but I did not.  PERTINENT HISTORY: Rt THA 2022, Rt TKA 01/09/22, Lt TKA 2018, Hx of Breast Cancer s/p double mastectomy with sentinel lymph node removed from right side  PAIN:  Are you having pain? Yes Reports 2-3/10 pain in right knee  PRECAUTIONS: Knee, s/p bilateral mastectomy  WEIGHT BEARING RESTRICTIONS: No  FALLS:  Has patient fallen in last 6 months? No  LIVING ENVIRONMENT: Lives with: lives alone Lives in: House/apartment Stairs: Yes: Internal: 1 flight steps; handrails both sides Has following equipment at home: walker and cane  OCCUPATION:   PLOF: Independent  PATIENT GOALS: be able to get upstairs to shower   NEXT MD VISIT: 03/02/22  OBJECTIVE:   DIAGNOSTIC FINDINGS:   PATIENT SURVEYS:  Eval:  FOTO 39, goal 8  COGNITION: Overall cognitive status: Within functional limits for tasks assessed     SENSATION: Not tested   POSTURE: weight shift left  PALPATION: Scar adhesions noted Rt surgical incision   LOWER EXTREMITY ROM: Eval: Right knee flexion 18-75 Left knee is [facility]  02/09/2022: Right knee flexion 91 degrees in supine  02/11/2022: Supine A/ROM Right knee:  0 - 93 deg Seated A/ROM Right knee:  5 - 82 deg Seated left knee A/ROM 0-123 deg  02/13/2022: Seated A/ROM Right knee:  5 - 95 deg  02/16/22: Knee flexion: 106 in supine  02/23/22:  Knee flexion: 110 degrees F supine  02/25/2022: Seated right knee A/ROM: 0 -108 deg Supine right knee A/ROM: 0 -110 deg Supine right knee AA/ROM with strap:  0-112 deg Supine right knee 114 degrees active  03/12/2022: Supine right knee 0-114 degrees A/ROM  03/16/22:  Supine right knee flexion: 0-116 degrees F   LOWER EXTREMITY MMT:  MMT Right eval  Left eval  Hip flexion 3 5   Hip extension    Hip abduction 3+   Hip adduction    Hip internal rotation    Hip external rotation    Knee flexion    Knee extension    Ankle dorsiflexion    Ankle plantarflexion    Ankle inversion    Ankle eversion     (Blank rows = not tested)  LOWER EXTREMITY SPECIAL TESTS:    FUNCTIONAL TESTS:  Eval: 5 times sit to stand: 13 sec, 2 HHA, weight shifted Lt Timed up and go (TUG): 16 sec, using RW  02/18/2022: 3 minute walk test:  341 ft with RW  GAIT: Distance walked: through clinic to treatment room and gym Assistive device utilized: Environmental consultant - 2 wheeled Level of assistance: Modified independence Comments: Rt LE decreased knee flexion/hip extension during swing phase and minimal PF noted during pushoff   TODAY'S TREATMENT:     03/16/22:  Recumbent bike full forward AROM level 1x 8 minutes with PTA present to discuss status Standing knee flexion with RLE on 2nd step of stairwell  10 sec hold at end range x10 Leg press (seat 7) 90# 10x, 95# 10x, Bil RTLE 40# 2x10 Prone ham curls 4# 3x10 VC to pull heel to buttocks Reverse walking 20# at cable 10x Supine bridge 10x Vc on muscular contractions to feel.  Game Ready to right knee:  3 snowflakes, medium compression, x10 min AROM see above   DATE: 03/12/2022 Recumbent bike full forward AROM level 1x 8 minutes with PT present to discuss status Seated heel slides with foot on slider 2x10 Seated LAQ 5# 2x10 RLE only Standing knee flexion with RLE on 2nd step of stairwell 2x10 Reverse walking with cable 10x VC to contract glutes #15 Prone ham curls 3# 2x10 Supine straight leg raise with 3# 2x10 Leg press (seat 7) 90# 2x10, Bil RTLE 40# 2x10 Game Ready to right knee:  3 snowflakes, medium compression, x10 min   03/09/22: Nustep L3 8 min with focus on pushing with quad Leg press (seat 7) 85# 2x10, Bil RTLE 30# 2x10 Reverse walking with cable 10x VC to contract glutes #15 Step ups 2x10 forward,  then lateral 2x10 1 hand rail used Prone ham curls 2# 2x10 LAQ 5# 2x10  Game Ready to right knee:  3 snowflakes, medium compression, x10 min   PATIENT EDUCATION:  Education details: eval findings/POC; importance of  gaining knee extension for pain/swelling/mobility; HEP Person educated: Patient Education method: Explanation, Verbal cues, and Handouts Education comprehension: verbalized understanding and returned demonstration  HOME EXERCISE PROGRAM: Access Code: [facility] URL: [email or web address] Date: 03/04/2022 Prepared by: Juel Burrow  Exercises - Active Straight Leg Raise with Quad Set  - 1 x daily - 7 x weekly - 2 sets - 10 reps - Supine Heel Slide with Strap  - 1 x daily - 7 x weekly - 2 sets - 10 reps - Seated Knee Flexion AAROM  - 3 x daily - 7 x weekly - 30 seconds hold - Seated Long Arc Quad  - 3 x daily - 7 x weekly - 1 sets - 10 reps - 5 hold - Standing Marching  - 1-2 x daily - 7 x weekly - 1 sets - 10 reps - Standing Knee Flexion AROM with Chair Support  - 1 x daily - 7 x weekly - 2 sets - 10 reps - Standing Hip Abduction with Counter Support  - 1 x daily - 7 x weekly - 2 sets - 10 reps - Standing Hamstring Stretch with Step  - 1 x daily - 7 x weekly - 1 sets - 2 reps - 20 sec hold - Mini Squat with Counter Support  - 1 x daily - 7 x weekly - 2 sets - 10 reps  ASSESSMENT:  CLINICAL IMPRESSION: Pt has stopped taking night time meds and is doing well. Pt is trying to get back to a regular routine of going to the gym and using the pool. Pt continues to tolerate increased loads/resistance for her strength. Knee flexion AROM continues to improve.  OBJECTIVE IMPAIRMENTS: Abnormal gait, decreased activity tolerance, decreased balance, decreased endurance, decreased mobility, difficulty walking, decreased ROM, decreased strength, hypomobility, increased edema, increased fascial restrictions, impaired flexibility, postural dysfunction, and pain.   ACTIVITY  LIMITATIONS: standing, squatting, sleeping, stairs, transfers, bed mobility, bathing, dressing, hygiene/grooming, and locomotion level  PARTICIPATION LIMITATIONS: cleaning, laundry, driving, shopping, community activity, and yard work  PERSONAL FACTORS: Age, Fitness, Past/current experiences, Time since onset of injury/illness/exacerbation, and 3+ comorbidities: Rt THA, Lt TKA, DM, anxiety  are also affecting patient's functional outcome.   REHAB POTENTIAL: Good  CLINICAL DECISION MAKING: Evolving/moderate complexity  EVALUATION COMPLEXITY: Moderate   GOALS: Goals reviewed with patient? Yes  SHORT TERM GOALS: Target date: 02/18/22 Pt will be independent with initial HEP to improve ROM and mobility. Baseline: Goal status: Goal met 02/09/21  2.  Pt will have atleast 90 deg of Rt knee flexion Baseline:  Goal status:Goal met 02/09/21  3.  Pt will have Rt knee extension to 0 deg.  Baseline:  Goal status: MET on 02/18/2022    LONG TERM GOALS: Target date: 04/01/22  Pt will have 120 deg Rt knee flexion and 0 deg Rt knee extension Baseline: 75 deg flexion, 18 deg ext Goal status: IN PROGRESS (see above)  2.  Pt will report atleast 50% improvement in Rt knee pain with ADLs. Baseline:  Goal status: MET on 03/12/2022  3.  Pt will be able to ambulate with LRAD atleast 120f with minimal compensations in her gait pattern.  Baseline:  Goal status: IN PROGRESS  4.  Pt will be able to ascend and descend 1 flight of stairs with/without handrails and step over pattern to allow her to get upstairs to her shower. Baseline:  Goal status: IN PROGRESS  5.  Pt will have 5/5 Rt hip/knee strength to improver her mechanics and efficiency with  daily activity.  Baseline:  Goal status: INITIAL    PLAN:  PT FREQUENCY:  3x/week for 4 weeks, then decrease to 1-2x/week for 4 weeks   PT DURATION: 8 weeks  PLANNED INTERVENTIONS: Therapeutic exercises, Therapeutic activity, Neuromuscular re-education,  Balance training, Gait training, Patient/Family education, Self Care, Joint mobilization, Cryotherapy, Manual therapy, and Re-evaluation  PLAN FOR NEXT SESSION: Assess and progress HEP as indicated, ROM and remeasure A/ROM, strengthening, progress ambulation and standing exercises  Myrene Galas, PTA 03/16/22 10:53 AM   [facility] [address] [place], [place] [address] Phone # [phone] Fax [phone]

## 2022-03-17 ENCOUNTER — Telehealth: Payer: Self-pay

## 2022-03-17 NOTE — Telephone Encounter (Signed)
Faxed referral to [facility] including demographics, insurance and most recent ov note. Received fax success confirmation. Forwarded original document to front desk x batch scanning.

## 2022-03-18 ENCOUNTER — Telehealth: Payer: Self-pay | Admitting: Pharmacist

## 2022-03-18 ENCOUNTER — Encounter: Payer: Self-pay | Admitting: Hematology and Oncology

## 2022-03-18 MED ORDER — MOUNJARO 7.5 MG/0.5ML ~~LOC~~ SOAJ: 7.5000 mg | SUBCUTANEOUS | 5 refills | Status: DC

## 2022-03-18 NOTE — Telephone Encounter (Signed)
Pt called clinic, states pharmacy won't honor her copay card for Mounjaro 7.80m unless ICD10 code for DM is on the prescription. New rx has been sent in with this info.

## 2022-03-19 ENCOUNTER — Other Ambulatory Visit: Payer: Self-pay | Admitting: Physician Assistant

## 2022-03-19 NOTE — Therapy (Signed)
OUTPATIENT PHYSICAL THERAPY TREATMENT NOTE   Patient Name: Sierra Perez MRN: [id number] DOB:05/12/1960, 62 y.o., female Today's Date: 03/20/2022  END OF SESSION:  PT End of Session - 03/20/22 1018     Visit Number 16    Number of Visits 7    Date for PT Re-Evaluation 04/01/22    Authorization Type [employer]    Authorization Time Period 02/04/22 to 04/01/22    Authorization - Visit Number 16    Authorization - Number of Visits 23    PT Start Time 1016    PT Stop Time 1103    PT Time Calculation (min) 47 min    Activity Tolerance Patient tolerated treatment well    Behavior During Therapy WFL for tasks assessed/performed                  Past Medical History:  Diagnosis Date   Anemia    Anxiety    Arthritis    Asthma    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, her-2 positive, ER/PR negative   Clotting disorder ([place])    testing was negative, h/o DVT while on treatment   Colon polyps    Complex regional pain syndrome i of right lower limb    RDS   Complication of anesthesia    Cystitis    Diabetes mellitus without complication ([place])    DVT (deep venous thrombosis) ([place])    started in 2015, multiple   Family history of breast cancer    Family history of colon cancer    Fibroid    Fibromyalgia    GERD (gastroesophageal reflux disease)    Heart murmur    HLD (hyperlipidemia)    HPV in female    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   Hypertension    Hypothyroidism    IBS (irritable bowel syndrome)    Mitral valve prolapse    Peptic ulcer    Personal history of malignant neoplasm of breast    Sleep apnea treated with continuous positive airway pressure (CPAP)    Past Surgical History:  Procedure Laterality Date   ABDOMINAL HYSTERECTOMY  2010   fibroids, h/o abnormal pap smears   AXILLARY SENTINEL NODE BIOPSY Right 07/08/2020   Procedure: RIGHT AXILLARY SENTINEL NODE BIOPSY;  Surgeon: Rolm Bookbinder, MD;  Location: [facility];  Service: General;  Laterality: Right;   BREAST RECONSTRUCTION WITH PLACEMENT OF TISSUE EXPANDER AND FLEX HD (ACELLULAR HYDRATED DERMIS) Bilateral 07/08/2020   Procedure: BILATERAL BREAST RECONSTRUCTION WITH  FLEX HD (ACELLULAR HYDRATED DERMIS),DIRECT IMPLANT RECONSTRUCTION;  Surgeon: Cindra Presume, MD;  Location: [facility];  Service: Plastics;  Laterality: Bilateral;   BREAST SURGERY Right 2015   right lumpectomy with reduction and lift   COLONOSCOPY     DENTAL SURGERY     ESOPHAGOGASTRODUODENOSCOPY     LAPAROSCOPIC GASTRIC SLEEVE RESECTION  2014   LAPAROSCOPIC OOPHERECTOMY Bilateral 2016   REPLACEMENT TOTAL KNEE Left 2018   also had 3 prior arthroscopies   TOE SURGERY Right    dislocated toe   TOTAL HIP ARTHROPLASTY Right 11/19/2020   Procedure: RIGHT TOTAL HIP ARTHROPLASTY ANTERIOR APPROACH;  Surgeon: Mcarthur Rossetti, MD;  Location: [facility];  Service: Orthopedics;  Laterality: Right;   TOTAL KNEE ARTHROPLASTY Right 01/09/2022   Procedure: RIGHT TOTAL KNEE ARTHROPLASTY;  Surgeon: Mcarthur Rossetti, MD;  Location: [facility];  Service: Orthopedics;  Laterality: Right;   TOTAL MASTECTOMY Bilateral 07/08/2020   Procedure: BILATERAL  TOTAL MASTECTOMY;  Surgeon: Rolm Bookbinder, MD;  Location: [facility];  Service: General;  Laterality: Bilateral;   VENA CAVA FILTER PLACEMENT  2017   Patient Active Problem List   Diagnosis Date Noted   Status post total right knee replacement 01/09/2022   Obesity (BMI 30-39.9) 06/23/2021   Iron deficiency anemia 01/23/2021   Status post right hip replacement 12/03/2020   Status post total replacement of right hip 11/19/2020   Breast cancer ([place]) 07/08/2020   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   H/O total hysterectomy 06/11/2020   Genetic testing 05/27/2020   Chronic superficial gastritis 05/27/2020   Fibromyalgia 05/27/2020   GERD (gastroesophageal reflux disease) 05/27/2020   Prediabetes 05/27/2020   Complex regional pain syndrome I,  unspecified 05/27/2020   Atypical lobular hyperplasia [facility]) of left breast 05/23/2020   History of therapeutic radiation 05/23/2020   Family history of breast cancer    Family history of colon cancer    Personal history of malignant neoplasm of breast    Unilateral primary osteoarthritis, right knee 02/26/2020   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   DVT (deep venous thrombosis) ([place]) 05/04/2018   Malignant neoplasm of upper-outer quadrant of right breast in female, estrogen receptor negative ([place]) 05/03/2018   Obstructive sleep apnea (adult) (pediatric) 09/14/2017   Adenopathy 07/15/2017   B12 deficiency 07/15/2017   Disorder of tympanic membrane of right ear 07/15/2017   Recurrent UTI 03/09/2017   Bladder pain 02/25/2017   Proteinuria 02/25/2017   Hematoma of thigh, left, subsequent encounter 08/16/2016   At risk for obstructive sleep apnea 07/01/2016   Suspected sleep apnea 07/01/2016   Arthritis of left knee 05/26/2016   Localized primary osteoarthritis of lower leg, left 05/26/2016   Clotting disorder ([place]) 05/22/2016   Chronic anticoagulation 01/06/2016   Right-sided epistaxis 01/06/2016   Urinary tract infection, site not specified 08/12/2015   Abnormal Pap smear of vagina 05/06/2015   Follow-up examination, following other surgery 12/14/2013   Pelvic and perineal pain 11/28/2013   Anxiety and depression 11/16/2013   H/O bariatric surgery 11/16/2013   Atrophic vaginitis 11/13/2013   Tear of lateral cartilage or meniscus of knee, current 04/14/2013   Allergic rhinitis 01/16/2013   Female genital symptoms 11/10/2012   Symptomatic menopausal or female climacteric states 11/10/2012   Disequilibrium 06/09/2012   Morbid obesity ([place]) 02/09/2012   Vitamin D deficiency 09/13/2008   Preop examination 04/20/2008   Hypothyroidism 05/20/2007    PCP: Wenda Low, MD  REFERRING PROVIDER: Erskine Emery, PA-C  REFERRING DIAG: s/p Rt TKA  THERAPY DIAG:  Stiffness of right knee, not  elsewhere classified  Acute pain of right knee  Localized edema  Unsteadiness on feet  Rationale for Evaluation and Treatment: Rehabilitation  ONSET DATE: 01/09/22  SUBJECTIVE:   SUBJECTIVE STATEMENT: No complaints, pt is going to gym regularly now.  PERTINENT HISTORY: Rt THA 2022, Rt TKA 01/09/22, Lt TKA 2018, Hx of Breast Cancer s/p double mastectomy with sentinel lymph node removed from right side  PAIN:  Are you having pain? Yes Reports 2-3/10 pain in right knee  PRECAUTIONS: Knee, s/p bilateral mastectomy  WEIGHT BEARING RESTRICTIONS: No  FALLS:  Has patient fallen in last 6 months? No  LIVING ENVIRONMENT: Lives with: lives alone Lives in: House/apartment Stairs: Yes: Internal: 1 flight steps; handrails both sides Has following equipment at home: walker and cane  OCCUPATION:   PLOF: Independent  PATIENT GOALS: be able to get upstairs to shower   NEXT MD VISIT: 03/02/22  OBJECTIVE:  DIAGNOSTIC FINDINGS:   PATIENT SURVEYS:  Eval:  FOTO 39, goal 49  COGNITION: Overall cognitive status: Within functional limits for tasks assessed     SENSATION: Not tested   POSTURE: weight shift left  PALPATION: Scar adhesions noted Rt surgical incision   LOWER EXTREMITY ROM: Eval: Right knee flexion 18-75 Left knee is [facility]  02/09/2022: Right knee flexion 91 degrees in supine  02/11/2022: Supine A/ROM Right knee:  0 - 93 deg Seated A/ROM Right knee:  5 - 82 deg Seated left knee A/ROM 0-123 deg  02/13/2022: Seated A/ROM Right knee:  5 - 95 deg  02/16/22: Knee flexion: 106 in supine  02/23/22:  Knee flexion: 110 degrees F supine  02/25/2022: Seated right knee A/ROM: 0 -108 deg Supine right knee A/ROM: 0 -110 deg Supine right knee AA/ROM with strap:  0-112 deg Supine right knee 114 degrees active  03/12/2022: Supine right knee 0-114 degrees A/ROM  03/16/22:  Supine right knee flexion: 0-116 degrees F   LOWER EXTREMITY MMT:  MMT Right eval  Left eval  Hip flexion 3 5   Hip extension    Hip abduction 3+   Hip adduction    Hip internal rotation    Hip external rotation    Knee flexion    Knee extension    Ankle dorsiflexion    Ankle plantarflexion    Ankle inversion    Ankle eversion     (Blank rows = not tested)  LOWER EXTREMITY SPECIAL TESTS:    FUNCTIONAL TESTS:  Eval: 5 times sit to stand: 13 sec, 2 HHA, weight shifted Lt Timed up and go (TUG): 16 sec, using RW  02/18/2022: 3 minute walk test:  341 ft with RW  GAIT: Distance walked: through clinic to treatment room and gym Assistive device utilized: Environmental consultant - 2 wheeled Level of assistance: Modified independence Comments: Rt LE decreased knee flexion/hip extension during swing phase and minimal PF noted during pushoff   TODAY'S TREATMENT:     03/20/22: Recumbent bike: L2 2 min warm up, then emphasis in heels down for 1 min, 1 min rest, then 4 more intervals of heels down in the straps.  Leg press (seat 6) 90# 10x, 95# 10x, Bil RTLE 40# 2x10 Reverse walking 15# 10x2 Prone ham curls 4# 2x15 Game Ready to right knee:  3 snowflakes, medium compression, x10 min  03/16/22:  Recumbent bike full forward AROM level 1x 8 minutes with PTA present to discuss status Standing knee flexion with RLE on 2nd step of stairwell  10 sec hold at end range x10 Leg press (seat 7) 90# 10x, 95# 10x, Bil RTLE 40# 2x10 Prone ham curls 4# 3x10 VC to pull heel to buttocks Reverse walking 10# at cable 10x Supine bridge 10x Vc on muscular contractions to feel.  Game Ready to right knee:  3 snowflakes, medium compression, x10 min AROM see above   DATE: 03/12/2022 Recumbent bike full forward AROM level 1x 8 minutes with PT present to discuss status Seated heel slides with foot on slider 2x10 Seated LAQ 5# 2x10 RLE only Standing knee flexion with RLE on 2nd step of stairwell 2x10 Reverse walking with cable 10x VC to contract glutes #15 Prone ham curls 3# 2x10 Supine straight leg  raise with 3# 2x10 Leg press (seat 7) 90# 2x10, Bil RTLE 40# 2x10 Game Ready to right knee:  3 snowflakes, medium compression, x10 min   PATIENT EDUCATION:  Education details: eval findings/POC; importance of gaining knee extension for  pain/swelling/mobility; HEP Person educated: Patient Education method: Explanation, Verbal cues, and Handouts Education comprehension: verbalized understanding and returned demonstration  HOME EXERCISE PROGRAM: Access Code: [facility] URL: [email or web address] Date: 03/04/2022 Prepared by: Juel Burrow  Exercises - Active Straight Leg Raise with Quad Set  - 1 x daily - 7 x weekly - 2 sets - 10 reps - Supine Heel Slide with Strap  - 1 x daily - 7 x weekly - 2 sets - 10 reps - Seated Knee Flexion AAROM  - 3 x daily - 7 x weekly - 30 seconds hold - Seated Long Arc Quad  - 3 x daily - 7 x weekly - 1 sets - 10 reps - 5 hold - Standing Marching  - 1-2 x daily - 7 x weekly - 1 sets - 10 reps - Standing Knee Flexion AROM with Chair Support  - 1 x daily - 7 x weekly - 2 sets - 10 reps - Standing Hip Abduction with Counter Support  - 1 x daily - 7 x weekly - 2 sets - 10 reps - Standing Hamstring Stretch with Step  - 1 x daily - 7 x weekly - 1 sets - 2 reps - 20 sec hold - Mini Squat with Counter Support  - 1 x daily - 7 x weekly - 2 sets - 10 reps  ASSESSMENT:  CLINICAL IMPRESSION: Pt arrives with minimal knee pain. Pt could perform sprint intervals on the recumbent bike with her heels down, making her knee flex more. Pt could also bring her seat in closer on the leg press to focus on a greater ROM and deeper knee flexion.   OBJECTIVE IMPAIRMENTS: Abnormal gait, decreased activity tolerance, decreased balance, decreased endurance, decreased mobility, difficulty walking, decreased ROM, decreased strength, hypomobility, increased edema, increased fascial restrictions, impaired flexibility, postural dysfunction, and pain.   ACTIVITY LIMITATIONS:  standing, squatting, sleeping, stairs, transfers, bed mobility, bathing, dressing, hygiene/grooming, and locomotion level  PARTICIPATION LIMITATIONS: cleaning, laundry, driving, shopping, community activity, and yard work  PERSONAL FACTORS: Age, Fitness, Past/current experiences, Time since onset of injury/illness/exacerbation, and 3+ comorbidities: Rt THA, Lt TKA, DM, anxiety  are also affecting patient's functional outcome.   REHAB POTENTIAL: Good  CLINICAL DECISION MAKING: Evolving/moderate complexity  EVALUATION COMPLEXITY: Moderate   GOALS: Goals reviewed with patient? Yes  SHORT TERM GOALS: Target date: 02/18/22 Pt will be independent with initial HEP to improve ROM and mobility. Baseline: Goal status: Goal met 02/09/21  2.  Pt will have atleast 90 deg of Rt knee flexion Baseline:  Goal status:Goal met 02/09/21  3.  Pt will have Rt knee extension to 0 deg.  Baseline:  Goal status: MET on 02/18/2022    LONG TERM GOALS: Target date: 04/01/22  Pt will have 120 deg Rt knee flexion and 0 deg Rt knee extension Baseline: 75 deg flexion, 18 deg ext Goal status: IN PROGRESS (see above)  2.  Pt will report atleast 50% improvement in Rt knee pain with ADLs. Baseline:  Goal status: MET on 03/12/2022  3.  Pt will be able to ambulate with LRAD atleast 144f with minimal compensations in her gait pattern.  Baseline:  Goal status: IN PROGRESS  4.  Pt will be able to ascend and descend 1 flight of stairs with/without handrails and step over pattern to allow her to get upstairs to her shower. Baseline:  Goal status: IN PROGRESS  5.  Pt will have 5/5 Rt hip/knee strength to improver her mechanics and efficiency with daily activity.  Baseline:  Goal status: INITIAL    PLAN:  PT FREQUENCY:  3x/week for 4 weeks, then decrease to 1-2x/week for 4 weeks   PT DURATION: 8 weeks  PLANNED INTERVENTIONS: Therapeutic exercises, Therapeutic activity, Neuromuscular re-education, Balance  training, Gait training, Patient/Family education, Self Care, Joint mobilization, Cryotherapy, Manual therapy, and Re-evaluation  PLAN FOR NEXT SESSION: Continue to finalize/ advance gym program for DC prep. Pt does have plans to get back into her pool routine.   Myrene Galas, PTA 03/20/22 10:55 AM   [facility] [address] [place], [place] [address] Phone # [phone] Fax [phone]

## 2022-03-20 ENCOUNTER — Ambulatory Visit: Payer: 59 | Admitting: Physical Therapy

## 2022-03-20 ENCOUNTER — Encounter: Payer: Self-pay | Admitting: Physical Therapy

## 2022-03-20 ENCOUNTER — Telehealth: Payer: Self-pay | Admitting: Hematology and Oncology

## 2022-03-20 DIAGNOSIS — M25561 Pain in right knee: Secondary | ICD-10-CM

## 2022-03-20 DIAGNOSIS — M25661 Stiffness of right knee, not elsewhere classified: Secondary | ICD-10-CM

## 2022-03-20 DIAGNOSIS — R600 Localized edema: Secondary | ICD-10-CM

## 2022-03-20 DIAGNOSIS — R2681 Unsteadiness on feet: Secondary | ICD-10-CM

## 2022-03-20 NOTE — Telephone Encounter (Signed)
Rescheduled appointment per provider PAL. Patient is aware of the changes made to her upcoming appointment. 

## 2022-03-23 ENCOUNTER — Encounter: Payer: Self-pay | Admitting: Rehabilitative and Restorative Service Providers"

## 2022-03-23 ENCOUNTER — Ambulatory Visit (INDEPENDENT_AMBULATORY_CARE_PROVIDER_SITE_OTHER): Payer: 59 | Admitting: Physician Assistant

## 2022-03-23 ENCOUNTER — Ambulatory Visit: Payer: 59 | Admitting: Rehabilitative and Restorative Service Providers"

## 2022-03-23 ENCOUNTER — Encounter: Payer: Self-pay | Admitting: Gastroenterology

## 2022-03-23 ENCOUNTER — Encounter: Payer: Self-pay | Admitting: Physician Assistant

## 2022-03-23 DIAGNOSIS — M25561 Pain in right knee: Secondary | ICD-10-CM | POA: Diagnosis not present

## 2022-03-23 DIAGNOSIS — R2681 Unsteadiness on feet: Secondary | ICD-10-CM

## 2022-03-23 DIAGNOSIS — R600 Localized edema: Secondary | ICD-10-CM

## 2022-03-23 DIAGNOSIS — M25661 Stiffness of right knee, not elsewhere classified: Secondary | ICD-10-CM

## 2022-03-23 DIAGNOSIS — Z96651 Presence of right artificial knee joint: Secondary | ICD-10-CM

## 2022-03-23 NOTE — Progress Notes (Signed)
HPI: Tumeka returns today status post right total knee arthroplasty 01/09/2022.  She is overall doing well.  She is stopped using a cane except for whenever she is on uneven ground.  States her pain is dissipating.  She still having problems with sleep.  She is to finish out with formal therapy this coming Friday.  Review of systems: See HPI otherwise negative  Physical exam: Right knee full extension flexion to approximately 115 degrees no instability valgus varus stressing.  Calf supple nontender.  Surgical incisions healing well no signs of infection.  Impression: Status post right total knee arthroplasty 01/09/2022  Plan: She will continue to perform the home exercise program as shown by therapy.  She will return to work on 03/30/2022 without restrictions.  Will see her back at 6 months postop and obtain an AP view of the knee and a lateral view of the knee.  Questions were encouraged and answered at length.

## 2022-03-23 NOTE — Therapy (Signed)
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 - Visit Number 36    Authorization - Number of Visits 23    PT Start Time [id number]    PT Stop Time 1105    PT Time Calculation (min) 51 min    Activity Tolerance Patient tolerated treatment well    Behavior During Therapy WFL for tasks assessed/performed                  Past Medical History:  Diagnosis Date   Anemia    Anxiety    Arthritis    Asthma    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, her-2 positive, ER/PR negative   Clotting disorder ([place])    testing was negative, h/o DVT while on treatment   Colon polyps    Complex regional pain syndrome i of right lower limb    RDS   Complication of anesthesia    Cystitis    Diabetes mellitus without complication ([place])    DVT (deep venous thrombosis) ([place])    started in 2015, multiple   Family history of breast cancer    Family history of colon cancer    Fibroid    Fibromyalgia    GERD (gastroesophageal reflux disease)    Heart murmur    HLD (hyperlipidemia)    HPV in female    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   Hypertension    Hypothyroidism    IBS (irritable bowel syndrome)    Mitral valve prolapse    Peptic ulcer    Personal history of malignant neoplasm of breast    Sleep apnea treated with continuous positive airway pressure (CPAP)    Past Surgical History:  Procedure Laterality Date   ABDOMINAL HYSTERECTOMY  2010   fibroids, h/o abnormal pap smears   AXILLARY SENTINEL NODE BIOPSY Right 07/08/2020   Procedure: RIGHT AXILLARY SENTINEL NODE BIOPSY;  Surgeon: Rolm Bookbinder, MD;  Location: [place];  Service: General;  Laterality: Right;   BREAST RECONSTRUCTION WITH  PLACEMENT OF TISSUE EXPANDER AND FLEX HD (ACELLULAR HYDRATED DERMIS) Bilateral 07/08/2020   Procedure: BILATERAL BREAST RECONSTRUCTION WITH  FLEX HD (ACELLULAR HYDRATED DERMIS),DIRECT IMPLANT RECONSTRUCTION;  Surgeon: Cindra Presume, MD;  Location: [place];  Service: Plastics;  Laterality: Bilateral;   BREAST SURGERY Right 2015   right lumpectomy with reduction and lift   COLONOSCOPY     DENTAL SURGERY     ESOPHAGOGASTRODUODENOSCOPY     LAPAROSCOPIC GASTRIC SLEEVE RESECTION  2014   LAPAROSCOPIC OOPHERECTOMY Bilateral 2016   REPLACEMENT TOTAL KNEE Left 2018   also had 3 prior arthroscopies   TOE SURGERY Right    dislocated toe   TOTAL HIP ARTHROPLASTY Right 11/19/2020   Procedure: RIGHT TOTAL HIP ARTHROPLASTY ANTERIOR APPROACH;  Surgeon: Mcarthur Rossetti, MD;  Location: [place];  Service: Orthopedics;  Laterality: Right;   TOTAL KNEE ARTHROPLASTY Right 01/09/2022   Procedure: RIGHT TOTAL KNEE ARTHROPLASTY;  Surgeon: Mcarthur Rossetti, MD;  Location: [facility];  Service: Orthopedics;  Laterality: Right;   TOTAL MASTECTOMY Bilateral 07/08/2020   Procedure: BILATERAL TOTAL MASTECTOMY;  Surgeon: Rolm Bookbinder, MD;  Location: [place];  Service: General;  Laterality:  Bilateral;   VENA CAVA FILTER PLACEMENT  2017   Patient Active Problem List   Diagnosis Date Noted   Status post total right knee replacement 01/09/2022   Obesity (BMI 30-39.9) 06/23/2021   Iron deficiency anemia 01/23/2021   Status post right hip replacement 12/03/2020   Status post total replacement of right hip 11/19/2020   Breast cancer ([place]) 07/08/2020   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   H/O total hysterectomy 06/11/2020   Genetic testing 05/27/2020   Chronic superficial gastritis 05/27/2020   Fibromyalgia 05/27/2020   GERD (gastroesophageal reflux disease) 05/27/2020   Prediabetes 05/27/2020   Complex regional pain syndrome I, unspecified 05/27/2020   Atypical lobular hyperplasia [facility]) of left breast  05/23/2020   History of therapeutic radiation 05/23/2020   Family history of breast cancer    Family history of colon cancer    Personal history of malignant neoplasm of breast    Unilateral primary osteoarthritis, right knee 02/26/2020   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   DVT (deep venous thrombosis) ([place]) 05/04/2018   Malignant neoplasm of upper-outer quadrant of right breast in female, estrogen receptor negative ([place]) 05/03/2018   Obstructive sleep apnea (adult) (pediatric) 09/14/2017   Adenopathy 07/15/2017   B12 deficiency 07/15/2017   Disorder of tympanic membrane of right ear 07/15/2017   Recurrent UTI 03/09/2017   Bladder pain 02/25/2017   Proteinuria 02/25/2017   Hematoma of thigh, left, subsequent encounter 08/16/2016   At risk for obstructive sleep apnea 07/01/2016   Suspected sleep apnea 07/01/2016   Arthritis of left knee 05/26/2016   Localized primary osteoarthritis of lower leg, left 05/26/2016   Clotting disorder ([place]) 05/22/2016   Chronic anticoagulation 01/06/2016   Right-sided epistaxis 01/06/2016   Urinary tract infection, site not specified 08/12/2015   Abnormal Pap smear of vagina 05/06/2015   Follow-up examination, following other surgery 12/14/2013   Pelvic and perineal pain 11/28/2013   Anxiety and depression 11/16/2013   H/O bariatric surgery 11/16/2013   Atrophic vaginitis 11/13/2013   Tear of lateral cartilage or meniscus of knee, current 04/14/2013   Allergic rhinitis 01/16/2013   Female genital symptoms 11/10/2012   Symptomatic menopausal or female climacteric states 11/10/2012   Disequilibrium 06/09/2012   Morbid obesity ([place]) 02/09/2012   Vitamin D deficiency 09/13/2008   Preop examination 04/20/2008   Hypothyroidism 05/20/2007    PCP: Wenda Low, MD  REFERRING PROVIDER: Erskine Emery, PA-C  REFERRING DIAG: s/p Rt TKA  THERAPY DIAG:  Stiffness of right knee, not elsewhere classified  Acute pain of right knee  Localized  edema  Unsteadiness on feet  Rationale for Evaluation and Treatment: Rehabilitation  ONSET DATE: 01/09/22  SUBJECTIVE:   SUBJECTIVE STATEMENT:  Pt reports that she was able to go to the gym this weekend and she did not feel the same ache that she did before.  "It's starting to not be in pain all of the time and I am not taking medication to help me sleep".  PERTINENT HISTORY: Rt THA 2022, Rt TKA 01/09/22, Lt TKA 2018, Hx of Breast Cancer s/p double mastectomy with sentinel lymph node removed from right side  PAIN:  Are you having pain? Yes Reports 2/10 pain in right knee  PRECAUTIONS: Knee, s/p bilateral mastectomy  WEIGHT BEARING RESTRICTIONS: No  FALLS:  Has patient fallen in last 6 months? No  LIVING ENVIRONMENT: Lives with: lives alone Lives in: House/apartment Stairs: Yes: Internal: 1 flight steps; handrails both sides Has following equipment at home: walker and cane  OCCUPATION:  PLOF: Independent  PATIENT GOALS: be able to get upstairs to shower   NEXT MD VISIT: 03/02/22  OBJECTIVE:   DIAGNOSTIC FINDINGS:   PATIENT SURVEYS:  Eval:  FOTO 39, goal 64 03/23/2022:  FOTO 69%  COGNITION: Overall cognitive status: Within functional limits for tasks assessed     SENSATION: Not tested   POSTURE: weight shift left  PALPATION: Scar adhesions noted Rt surgical incision   LOWER EXTREMITY ROM: Eval: Right knee flexion 18-75 Left knee is [facility]  02/09/2022: Right knee flexion 91 degrees in supine  02/11/2022: Supine A/ROM Right knee:  0 - 93 deg Seated A/ROM Right knee:  5 - 82 deg Seated left knee A/ROM 0-123 deg  02/13/2022: Seated A/ROM Right knee:  5 - 95 deg  02/16/22: Knee flexion: 106 in supine  02/23/22:  Knee flexion: 110 degrees F supine  02/25/2022: Seated right knee A/ROM: 0 -108 deg Supine right knee A/ROM: 0 -110 deg Supine right knee AA/ROM with strap:  0-112 deg Supine right knee 114 degrees active  03/12/2022: Supine right knee 0-114  degrees A/ROM  03/16/22:  Supine right knee flexion: 0-116 degrees F  03/23/2022: Supine right knee:  0-125 degrees   LOWER EXTREMITY MMT:  MMT Right eval Left eval  Hip flexion 3 5   Hip extension    Hip abduction 3+   Hip adduction    Hip internal rotation    Hip external rotation    Knee flexion    Knee extension    Ankle dorsiflexion    Ankle plantarflexion    Ankle inversion    Ankle eversion     (Blank rows = not tested)  LOWER EXTREMITY SPECIAL TESTS:    FUNCTIONAL TESTS:  Eval: 5 times sit to stand: 13 sec, 2 HHA, weight shifted Lt Timed up and go (TUG): 16 sec, using RW  02/18/2022: 3 minute walk test:  341 ft with RW  03/23/2022: 3 minute walk test:  674 ft without assistive device  GAIT: Distance walked: through clinic to treatment room and gym Assistive device utilized: Environmental consultant - 2 wheeled Level of assistance: Modified independence Comments: Rt LE decreased knee flexion/hip extension during swing phase and minimal PF noted during pushoff   TODAY'S TREATMENT:     DATE: 03/12/2022 3 minute walk test without assistive device Recumbent bike level 2 x5 min with PT present to discuss status Supine heel slide with strap 2x10 Prone hamstring curl 4# 2x10 Leg press (seat 6) 95# 2x10, Bil RTLE 45# 2x10 Game Ready to right knee:  3 snowflakes, medium compression, x10 min   03/20/22: Recumbent bike: L2 2 min warm up, then emphasis in heels down for 1 min, 1 min rest, then 4 more intervals of heels down in the straps.  Leg press (seat 6) 90# 10x, 95# 10x, Bil RTLE 40# 2x10 Reverse walking 15# 10x2 Prone ham curls 4# 2x15 Game Ready to right knee:  3 snowflakes, medium compression, x10 min  03/16/22:  Recumbent bike full forward AROM level 1x 8 minutes with PTA present to discuss status Standing knee flexion with RLE on 2nd step of stairwell  10 sec hold at end range x10 Leg press (seat 7) 90# 10x, 95# 10x, Bil RTLE 40# 2x10 Prone ham curls 4# 3x10 VC to  pull heel to buttocks Reverse walking 10# at cable 10x Supine bridge 10x Vc on muscular contractions to feel.  Game Ready to right knee:  3 snowflakes, medium compression, x10 min AROM see above  PATIENT EDUCATION:  Education details: eval findings/POC; importance of gaining knee extension for pain/swelling/mobility; HEP Person educated: Patient Education method: Explanation, Verbal cues, and Handouts Education comprehension: verbalized understanding and returned demonstration  HOME EXERCISE PROGRAM: Access Code: [email or web address] URL: [email or web address] Date: 03/04/2022 Prepared by: Juel Burrow  Exercises - Active Straight Leg Raise with Quad Set  - 1 x daily - 7 x weekly - 2 sets - 10 reps - Supine Heel Slide with Strap  - 1 x daily - 7 x weekly - 2 sets - 10 reps - Seated Knee Flexion AAROM  - 3 x daily - 7 x weekly - 30 seconds hold - Seated Long Arc Quad  - 3 x daily - 7 x weekly - 1 sets - 10 reps - 5 hold - Standing Marching  - 1-2 x daily - 7 x weekly - 1 sets - 10 reps - Standing Knee Flexion AROM with Chair Support  - 1 x daily - 7 x weekly - 2 sets - 10 reps - Standing Hip Abduction with Counter Support  - 1 x daily - 7 x weekly - 2 sets - 10 reps - Standing Hamstring Stretch with Step  - 1 x daily - 7 x weekly - 1 sets - 2 reps - 20 sec hold - Mini Squat with Counter Support  - 1 x daily - 7 x weekly - 2 sets - 10 reps  ASSESSMENT:  CLINICAL IMPRESSION:  Ms Jirak presents to skilled PT reporting that she has been able to go to the gym.  Patient has improved with her right knee ROM and is able to achieve 0-120 degrees in sitting and 0-125 briefly in supine.  Patient with increased distance noted with 3 minute walk test and was able to ambulate without assistive device during visit.  Patient with improved gait pattern noted without use of assistive device and educated that she could begin ambulating most places without assistive device, but to continue to  consider use with uneven surfaces or in a busy environment. Patient goes to see her surgeon later today and will have possible discharge from PT at the end of this week pending her visit from ortho.  OBJECTIVE IMPAIRMENTS: Abnormal gait, decreased activity tolerance, decreased balance, decreased endurance, decreased mobility, difficulty walking, decreased ROM, decreased strength, hypomobility, increased edema, increased fascial restrictions, impaired flexibility, postural dysfunction, and pain.   ACTIVITY LIMITATIONS: standing, squatting, sleeping, stairs, transfers, bed mobility, bathing, dressing, hygiene/grooming, and locomotion level  PARTICIPATION LIMITATIONS: cleaning, laundry, driving, shopping, community activity, and yard work  PERSONAL FACTORS: Age, Fitness, Past/current experiences, Time since onset of injury/illness/exacerbation, and 3+ comorbidities: Rt THA, Lt TKA, DM, anxiety  are also affecting patient's functional outcome.   REHAB POTENTIAL: Good  CLINICAL DECISION MAKING: Evolving/moderate complexity  EVALUATION COMPLEXITY: Moderate   GOALS: Goals reviewed with patient? Yes  SHORT TERM GOALS: Target date: 02/18/22 Pt will be independent with initial HEP to improve ROM and mobility. Baseline: Goal status: Goal met 02/09/21  2.  Pt will have atleast 90 deg of Rt knee flexion Baseline:  Goal status:Goal met 02/09/21  3.  Pt will have Rt knee extension to 0 deg.  Baseline:  Goal status: MET on 02/18/2022    LONG TERM GOALS: Target date: 04/01/22  Pt will have 120 deg Rt knee flexion and 0 deg Rt knee extension Baseline: 75 deg flexion, 18 deg ext Goal status: MET on 03/23/2022  2.  Pt will report atleast 50% improvement in Rt knee pain  with ADLs. Baseline:  Goal status: MET on 03/12/2022  3.  Pt will be able to ambulate with LRAD atleast 137f with minimal compensations in her gait pattern.  Baseline:  Goal status: MET on 03/23/2022  4.  Pt will be able to ascend  and descend 1 flight of stairs with/without handrails and step over pattern to allow her to get upstairs to her shower. Baseline:  Goal status: IN PROGRESS  5.  Pt will have 5/5 Rt hip/knee strength to improver her mechanics and efficiency with daily activity.  Baseline:  Goal status: IN PROGRESS    PLAN:  PT FREQUENCY:  3x/week for 4 weeks, then decrease to 1-2x/week for 4 weeks   PT DURATION: 8 weeks  PLANNED INTERVENTIONS: Therapeutic exercises, Therapeutic activity, Neuromuscular re-education, Balance training, Gait training, Patient/Family education, Self Care, Joint mobilization, Cryotherapy, Manual therapy, and Re-evaluation  PLAN FOR NEXT SESSION: Possible discharge next visit  Jadore Veals MLubertha Sayres PT 03/23/22 11:14 AM   [id number] [place]achelG[place]dig[place] [address]Phone # [phone] [phone]

## 2022-03-24 ENCOUNTER — Other Ambulatory Visit: Payer: Self-pay | Admitting: Hematology and Oncology

## 2022-03-24 ENCOUNTER — Encounter: Payer: Self-pay | Admitting: Hematology and Oncology

## 2022-03-24 ENCOUNTER — Ambulatory Visit
Admission: RE | Admit: 2022-03-24 | Discharge: 2022-03-24 | Disposition: A | Discharge status: Home / Self Care | Payer: 59 | Source: Ambulatory Visit | Attending: Hematology and Oncology | Admitting: Hematology and Oncology

## 2022-03-24 DIAGNOSIS — Z171 Estrogen receptor negative status [ER-]: Secondary | ICD-10-CM

## 2022-03-24 NOTE — Progress Notes (Signed)
HEMATOLOGY-ONCOLOGY TELEPHONE VISIT PROGRESS NOTE  I connected with our patient on 03/26/22 at  8:15 AM EST by telephone and verified that I am speaking with the correct person using two identifiers.  I discussed the limitations, risks, security and privacy concerns of performing an evaluation and management service by telephone and the availability of in person appointments.  I also discussed with the patient that there may be a patient responsible charge related to this service. The patient expressed understanding and agreed to proceed.   History of Present Illness: Sierra Perez is a 62 y.o. female with above-mentioned history of right breast cancer previously treated with neoadjuvant chemotherapy followed by lumpectomy and radiation for HER-2 positive breast cancer. She presents to the clinic for a telephone follow-up to discuss MM and [place] results.  She had a retinal tear and underwent a laser surgery a couple of days ago and is recovering from that.  Oncology History  Malignant neoplasm of upper-outer quadrant of right breast in female, estrogen receptor negative ([place])  08/27/2012 Initial Diagnosis   Stage 1a IDC with DCIS (T2N0M0) : ER-, PR-, HER positive 3+, Ki67 20-25%   08/27/2012 Cancer Staging   Staging form: Breast, AJCC 8th Edition - Clinical stage from 08/27/2012: Stage IIA (cT2, cN0, cM0, G3, ER-, PR-, HER2+) - Signed by Nicholas Lose, MD on 11/17/2019   09/06/2012 Breast MRI   Right breast, lateral to the nipple, 10x14x45m mass with mildly irregular borders   09/10/2012 Echocardiogram   EF 55-60%    Chemotherapy   Neoadjuvant Taxotere/Carboplatin/Herceptin/Perjeta followed by adjuvant Herceptin maintenance   01/09/2013 Breast MRI   Complete pathological response to neoadjuvant treatment: right breast mass previously noted at 9:00 position no longer identified.   02/13/2013 Surgery   Right breast lumpectomy: benign parenchyma with some stromal fibrosis consistent with previous tumor  site, focal area of atypical lobular hyperplasia, all lymph nodes negative.     Radiation Therapy   Adjuvant radiation   04/06/2013 Imaging   DEXA: T-score of -1.1, osteopenia    09/25/2016 Initial Biopsy   Columnar cell alteration with atypia, foreign body granuloma   05/10/2020 Relapse/Recurrence   Breast MRI detected new linear non-mass enhancement right breast 2.1 cm, non-mass enhancement centrally left breast spanning 10.5 cm, left breast nipple areolar complex enhancement: Biopsy right breast: Grade 2 invasive lobular cancer.  ER/PR positive HER-2 equivocal by IHC FISH pending, Ki-67 1% left breast biopsy anterior fibrocystic change, posterior atypical lobular hyperplasia/LCIS   05/26/2020 Genetic Testing   Negative genetic testing on the CancerNext-Expanded+RNAinsight panel.  The CancerNext-Expanded gene panel offered by [facility] includes sequencing and rearrangement analysis for the following 77 genes: AIP, ALK, APC*, ATM*, AXIN2, BAP1, BARD1, BLM, BMPR1A, BRCA1*, BRCA2*, BRIP1*, CDC73, CDH1*, CDK4, CDKN1B, CDKN2A, CHEK2*, CTNNA1, DICER1, FANCC, FH, FLCN, GALNT12, KIF1B, LZTR1, MAX, MEN1, MET, MLH1*, MSH2*, MSH3, MSH6*, MUTYH*, NBN, NF1*, NF2, NTHL1, PALB2*, PHOX2B, PMS2*, POT1, PRKAR1A, PTCH1, PTEN*, RAD51C*, RAD51D*, RB1, RECQL, RET, SDHA, SDHAF2, SDHB, SDHC, SDHD, SMAD4, SMARCA4, SMARCB1, SMARCE1, STK11, SUFU, TMEM127, TP53*, TSC1, TSC2, VHL and XRCC2 (sequencing and deletion/duplication); EGFR, EGLN1, HOXB13, KIT, MITF, PDGFRA, POLD1, and POLE (sequencing only); EPCAM and GREM1 (deletion/duplication only). DNA and RNA analyses performed for * genes. The report date is May 26, 2020.    07/08/2020 Surgery   Bilateral mastectomies with reconstruction: Right mastectomy: Grade 2 ILC 0.9 cm, ALH, margins negative, 0/1 lymph, left mastectomy: Benign    08/2020 -  Anti-estrogen oral therapy   adjuvant antiestrogen therapy with anastrozole  1 mg daily x7 years started July 2022       REVIEW OF SYSTEMS:   Constitutional: Denies fevers, chills or abnormal weight loss All other systems were reviewed with the patient and are negative. Observations/Objective:     Assessment Plan:  Malignant neoplasm of upper-outer quadrant of right breast in female, estrogen receptor negative ([place]) 08/27/2012: Right breast: Stage Ia IDC with DCIS T2 N0 M0 ER negative PR negative, HER-2 positive, Ki-67 20 to 25% status post neoadjuvant TCHP: Complete pathologic response, adjuvant radiation   05/10/2020: Recurrence/relapse: Right breast: 2.1 cm non-mass enhancement: Biopsy grade 2 invasive lobular cancer ER/PR positive HER-2 equivocal by IHC FISH pending, Ki-67 1% Left breast: 10.5 cm non-mass enhancement anterior biopsy fibrocystic change, posterior biopsy ALH/LCIS   Treatment plan: 1.   07/08/2020 Bilateral mastectomies with reconstruction: Right mastectomy: Grade 2 ILC 0.9 cm, ALH, margins negative, 0/1 lymph, left mastectomy: Benign 2. adjuvant antiestrogen therapy with anastrozole 1 mg daily x7 years started July 2022 Genetic testing ------------------------------------------------------------ Current treatment: Adjuvant antiestrogen therapy with anastrozole 1 mg daily x7 years started July 2022 Anastrozole Toxicities: Denies any major side effects to anastrozole therapy.   Palpable lump in the right axilla: Ultrasound 03/24/2022: No sonographic evidence of malignancy.  Iron deficiency anemia Secondary to right hip replacement surgery November 19, 2020.  Her hemoglobin went from 10.4 down to 7.7.  Received IV iron 01/25/2021    Knee replacement surgery: 01/09/2022 03/24/22: Retinal tear : Laser surgery done  Lab review: 02/06/2022: Hemoglobin 10, MCV 84.3, platelets 428, iron 61, TIBC 311, iron saturation 20%, ferritin 203 RTC in Nov as previously scheduled.   I discussed the assessment and treatment plan with the patient. The patient was provided an opportunity to ask questions and  all were answered. The patient agreed with the plan and demonstrated an understanding of the instructions. The patient was advised to call back or seek an in-person evaluation if the symptoms worsen or if the condition fails to improve as anticipated.   I provided 12 minutes of non-face-to-face time during this encounter.  This includes time for charting and coordination of care   Harriette Ohara, MD  I Gardiner Coins am acting as a scribe for Dr.Vinay Gudena  I have reviewed the above documentation for accuracy and completeness, and I agree with the above.

## 2022-03-26 ENCOUNTER — Inpatient Hospital Stay: Payer: 59 | Attending: Adult Health | Admitting: Hematology and Oncology

## 2022-03-26 DIAGNOSIS — C50411 Malignant neoplasm of upper-outer quadrant of right female breast: Secondary | ICD-10-CM

## 2022-03-26 DIAGNOSIS — Z171 Estrogen receptor negative status [ER-]: Secondary | ICD-10-CM | POA: Diagnosis not present

## 2022-03-26 NOTE — Assessment & Plan Note (Signed)
08/27/2012: Right breast: Stage Ia IDC with DCIS T2 N0 M0 ER negative PR negative, HER-2 positive, Ki-67 20 to 25% status post neoadjuvant TCHP: Complete pathologic response, adjuvant radiation   05/10/2020: Recurrence/relapse: Right breast: 2.1 cm non-mass enhancement: Biopsy grade 2 invasive lobular cancer ER/PR positive HER-2 equivocal by IHC FISH pending, Ki-67 1% Left breast: 10.5 cm non-mass enhancement anterior biopsy fibrocystic change, posterior biopsy ALH/LCIS   Treatment plan: 1.   07/08/2020 Bilateral mastectomies with reconstruction: Right mastectomy: Grade 2 ILC 0.9 cm, ALH, margins negative, 0/1 lymph, left mastectomy: Benign 2. adjuvant antiestrogen therapy with anastrozole 1 mg daily x7 years started July 2022 Genetic testing ------------------------------------------------------------ Current treatment: Adjuvant antiestrogen therapy with anastrozole 1 mg daily x7 years started July 2022 Anastrozole Toxicities: Denies any major side effects to anastrozole therapy.   Palpable lump in the right axilla: Ultrasound 03/24/2022: No sonographic evidence of malignancy.  Iron deficiency anemia Secondary to right hip replacement surgery November 19, 2020.  Her hemoglobin went from 10.4 down to 7.7.  Received IV iron 01/25/2021    Knee replacement surgery: 01/09/2022   Lab review: 02/06/2022: Hemoglobin 10, MCV 84.3, platelets 428, iron 61, TIBC 311, iron saturation 20%, ferritin 203

## 2022-03-27 ENCOUNTER — Encounter: Payer: Self-pay | Admitting: Rehabilitative and Restorative Service Providers"

## 2022-03-27 ENCOUNTER — Inpatient Hospital Stay: Payer: 59 | Admitting: Hematology and Oncology

## 2022-03-27 ENCOUNTER — Ambulatory Visit: Payer: 59 | Admitting: Rehabilitative and Restorative Service Providers"

## 2022-03-27 ENCOUNTER — Telehealth: Payer: 59 | Admitting: Plastic Surgery

## 2022-03-27 DIAGNOSIS — M25561 Pain in right knee: Secondary | ICD-10-CM | POA: Diagnosis not present

## 2022-03-27 DIAGNOSIS — R600 Localized edema: Secondary | ICD-10-CM

## 2022-03-27 DIAGNOSIS — M25661 Stiffness of right knee, not elsewhere classified: Secondary | ICD-10-CM

## 2022-03-27 DIAGNOSIS — R2681 Unsteadiness on feet: Secondary | ICD-10-CM

## 2022-03-27 NOTE — Therapy (Addendum)
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 18    Progress Note Due on Visit 10    PT Start Time 1013    PT Stop Time 1101    PT Time Calculation (min) 48 min    Activity Tolerance Patient tolerated treatment well    Behavior During Therapy WFL for tasks assessed/performed                  Past Medical History:  Diagnosis Date   Anemia    Anxiety    Arthritis    Asthma    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, her-2 positive, ER/PR negative   Clotting disorder ([place])    testing was negative, h/o DVT while on treatment   Colon polyps    Complex regional pain syndrome i of right lower limb    RDS   Complication of anesthesia    Cystitis    Diabetes mellitus without complication ([place])    DVT (deep venous thrombosis) ([place])    started in 2015, multiple   Family history of breast cancer    Family history of colon cancer    Fibroid    Fibromyalgia    GERD (gastroesophageal reflux disease)    Heart murmur    HLD (hyperlipidemia)    HPV in female    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   Hypertension    Hypothyroidism    IBS (irritable bowel syndrome)    Mitral valve prolapse    Peptic ulcer    Personal history of malignant neoplasm of breast    Sleep apnea treated with continuous positive airway pressure (CPAP)    Past Surgical History:  Procedure Laterality Date   ABDOMINAL HYSTERECTOMY  2010   fibroids, h/o abnormal pap smears   AXILLARY SENTINEL NODE BIOPSY Right 07/08/2020   Procedure: RIGHT AXILLARY SENTINEL NODE BIOPSY;  Surgeon: Rolm Bookbinder, MD;  Location: [facility];  Service: General;  Laterality: Right;   BREAST  RECONSTRUCTION WITH PLACEMENT OF TISSUE EXPANDER AND FLEX HD (ACELLULAR HYDRATED DERMIS) Bilateral 07/08/2020   Procedure: BILATERAL BREAST RECONSTRUCTION WITH  FLEX HD (ACELLULAR HYDRATED DERMIS),DIRECT IMPLANT RECONSTRUCTION;  Surgeon: Cindra Presume, MD;  Location: [facility];  Service: Plastics;  Laterality: Bilateral;   BREAST SURGERY Right 2015   right lumpectomy with reduction and lift   COLONOSCOPY     DENTAL SURGERY     ESOPHAGOGASTRODUODENOSCOPY     LAPAROSCOPIC GASTRIC SLEEVE RESECTION  2014   LAPAROSCOPIC OOPHERECTOMY Bilateral 2016   REPLACEMENT TOTAL KNEE Left 2018   also had 3 prior arthroscopies   TOE SURGERY Right    dislocated toe   TOTAL HIP ARTHROPLASTY Right 11/19/2020   Procedure: RIGHT TOTAL HIP ARTHROPLASTY ANTERIOR APPROACH;  Surgeon: Mcarthur Rossetti, MD;  Location: [facility];  Service: Orthopedics;  Laterality: Right;   TOTAL KNEE ARTHROPLASTY Right 01/09/2022   Procedure: RIGHT TOTAL KNEE ARTHROPLASTY;  Surgeon: Mcarthur Rossetti, MD;  Location: [facility];  Service: Orthopedics;  Laterality: Right;   TOTAL MASTECTOMY Bilateral 07/08/2020   Procedure: BILATERAL TOTAL MASTECTOMY;  Surgeon: Rolm Bookbinder, MD;  Location: [facility];  Service:  General;  Laterality: Bilateral;   VENA CAVA FILTER PLACEMENT  2017   Patient Active Problem List   Diagnosis Date Noted   Status post total right knee replacement 01/09/2022   Obesity (BMI 30-39.9) 06/23/2021   Iron deficiency anemia 01/23/2021   Status post right hip replacement 12/03/2020   Status post total replacement of right hip 11/19/2020   Breast cancer ([place]) 07/08/2020   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   H/O total hysterectomy 06/11/2020   Genetic testing 05/27/2020   Chronic superficial gastritis 05/27/2020   Fibromyalgia 05/27/2020   GERD (gastroesophageal reflux disease) 05/27/2020   Prediabetes 05/27/2020   Complex regional pain syndrome I, unspecified 05/27/2020   Atypical lobular hyperplasia [facility])  of left breast 05/23/2020   History of therapeutic radiation 05/23/2020   Family history of breast cancer    Family history of colon cancer    Personal history of malignant neoplasm of breast    Unilateral primary osteoarthritis, right knee 02/26/2020   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   DVT (deep venous thrombosis) ([place]) 05/04/2018   Malignant neoplasm of upper-outer quadrant of right breast in female, estrogen receptor negative ([place]) 05/03/2018   Obstructive sleep apnea (adult) (pediatric) 09/14/2017   Adenopathy 07/15/2017   B12 deficiency 07/15/2017   Disorder of tympanic membrane of right ear 07/15/2017   Recurrent UTI 03/09/2017   Bladder pain 02/25/2017   Proteinuria 02/25/2017   Hematoma of thigh, left, subsequent encounter 08/16/2016   At risk for obstructive sleep apnea 07/01/2016   Suspected sleep apnea 07/01/2016   Arthritis of left knee 05/26/2016   Localized primary osteoarthritis of lower leg, left 05/26/2016   Clotting disorder ([place]) 05/22/2016   Chronic anticoagulation 01/06/2016   Right-sided epistaxis 01/06/2016   Urinary tract infection, site not specified 08/12/2015   Abnormal Pap smear of vagina 05/06/2015   Follow-up examination, following other surgery 12/14/2013   Pelvic and perineal pain 11/28/2013   Anxiety and depression 11/16/2013   H/O bariatric surgery 11/16/2013   Atrophic vaginitis 11/13/2013   Tear of lateral cartilage or meniscus of knee, current 04/14/2013   Allergic rhinitis 01/16/2013   Female genital symptoms 11/10/2012   Symptomatic menopausal or female climacteric states 11/10/2012   Disequilibrium 06/09/2012   Morbid obesity ([place]) 02/09/2012   Vitamin D deficiency 09/13/2008   Preop examination 04/20/2008   Hypothyroidism 05/20/2007    PCP: Wenda Low, MD  REFERRING PROVIDER: Erskine Emery, PA-C  REFERRING DIAG: s/p Rt TKA  THERAPY DIAG:  Stiffness of right knee, not elsewhere classified  Acute pain of right knee  Localized  edema  Unsteadiness on feet  Rationale for Evaluation and Treatment: Rehabilitation  ONSET DATE: 01/09/22  SUBJECTIVE:   SUBJECTIVE STATEMENT:  Pt reports thinks that she walked too much yesterday for about 20 minutes outside and she is having a little increased pain today.  PERTINENT HISTORY: Rt THA 2022, Rt TKA 01/09/22, Lt TKA 2018, Hx of Breast Cancer s/p double mastectomy with sentinel lymph node removed from right side  PAIN:  Are you having pain? Yes Reports 2-5/10 pain in right knee  PRECAUTIONS: Knee, s/p bilateral mastectomy  WEIGHT BEARING RESTRICTIONS: No  FALLS:  Has patient fallen in last 6 months? No  LIVING ENVIRONMENT: Lives with: lives alone Lives in: House/apartment Stairs: Yes: Internal: 1 flight steps; handrails both sides Has following equipment at home: walker and cane  OCCUPATION:   PLOF: Independent  PATIENT GOALS: be able to get upstairs to shower    OBJECTIVE:  PATIENT SURVEYS:  Eval:  FOTO 39, goal 64 03/23/2022:  FOTO 69%  COGNITION: Overall cognitive status: Within functional limits for tasks assessed     SENSATION: Not tested   POSTURE: weight shift left  PALPATION: Scar adhesions noted Rt surgical incision   LOWER EXTREMITY ROM: Eval: Right knee flexion 18-75 Left knee is [facility]  02/09/2022: Right knee flexion 91 degrees in supine  02/11/2022: Supine A/ROM Right knee:  0 - 93 deg Seated A/ROM Right knee:  5 - 82 deg Seated left knee A/ROM 0-123 deg  02/13/2022: Seated A/ROM Right knee:  5 - 95 deg  02/16/22: Knee flexion: 106 in supine  02/23/22:  Knee flexion: 110 degrees F supine  02/25/2022: Seated right knee A/ROM: 0 -108 deg Supine right knee A/ROM: 0 -110 deg Supine right knee AA/ROM with strap:  0-112 deg Supine right knee 114 degrees active  03/12/2022: Supine right knee 0-114 degrees A/ROM  03/16/22:  Supine right knee flexion: 0-116 degrees F  03/23/2022: Supine right knee:  0-125  degrees   LOWER EXTREMITY MMT:  MMT Right eval Left eval  Hip flexion 3 5   Hip extension    Hip abduction 3+   Hip adduction    Hip internal rotation    Hip external rotation    Knee flexion    Knee extension    Ankle dorsiflexion    Ankle plantarflexion    Ankle inversion    Ankle eversion     (Blank rows = not tested)  LOWER EXTREMITY SPECIAL TESTS:    FUNCTIONAL TESTS:  Eval: 5 times sit to stand: 13 sec, 2 HHA, weight shifted Lt Timed up and go (TUG): 16 sec, using RW  02/18/2022: 3 minute walk test:  341 ft with RW  03/23/2022: 3 minute walk test:  674 ft without assistive device  GAIT: Distance walked: through clinic to treatment room and gym Assistive device utilized: Environmental consultant - 2 wheeled Level of assistance: Modified independence Comments: Rt LE decreased knee flexion/hip extension during swing phase and minimal PF noted during pushoff   TODAY'S TREATMENT:     DATE: 03/27/2022 Manual Therapy:  soft tissue mobilization and retrograde massage to right thigh.  Educated pt in use of massage roller and pt able to demonstrate proper use. Ambulation in PT clinic without use of assistive device Stairs in clinic x3 with reciprocal pattern x3 Nustep level 1 x8 minutes with PT present to discuss status (kept resistance low secondary to laser eye surgery on Wednesday) Educated on HEP moving forward and to not push herself too much, and the importance of slow progression Right knee A/ROM in sitting 0-120 degrees Game Ready to right knee:  3 snowflakes, medium compression, x10 min   DATE: 03/23/2022 3 minute walk test without assistive device Recumbent bike level 2 x5 min with PT present to discuss status Supine heel slide with strap 2x10 Prone hamstring curl 4# 2x10 Leg press (seat 6) 95# 2x10, Bil RTLE 45# 2x10 Game Ready to right knee:  3 snowflakes, medium compression, x10 min   03/20/22: Recumbent bike: L2 2 min warm up, then emphasis in heels down for 1 min,  1 min rest, then 4 more intervals of heels down in the straps.  Leg press (seat 6) 90# 10x, 95# 10x, Bil RTLE 40# 2x10 Reverse walking 15# 10x2 Prone ham curls 4# 2x15 Game Ready to right knee:  3 snowflakes, medium compression, x10 min    PATIENT EDUCATION:  Education details: eval findings/POC; importance  of gaining knee extension for pain/swelling/mobility; HEP Person educated: Patient Education method: Explanation, Verbal cues, and Handouts Education comprehension: verbalized understanding and returned demonstration  HOME EXERCISE PROGRAM: Access Code: [email or web address] URL: [email or web address] Date: 03/04/2022 Prepared by: Juel Burrow  Exercises - Active Straight Leg Raise with Quad Set  - 1 x daily - 7 x weekly - 2 sets - 10 reps - Supine Heel Slide with Strap  - 1 x daily - 7 x weekly - 2 sets - 10 reps - Seated Knee Flexion AAROM  - 3 x daily - 7 x weekly - 30 seconds hold - Seated Long Arc Quad  - 3 x daily - 7 x weekly - 1 sets - 10 reps - 5 hold - Standing Marching  - 1-2 x daily - 7 x weekly - 1 sets - 10 reps - Standing Knee Flexion AROM with Chair Support  - 1 x daily - 7 x weekly - 2 sets - 10 reps - Standing Hip Abduction with Counter Support  - 1 x daily - 7 x weekly - 2 sets - 10 reps - Standing Hamstring Stretch with Step  - 1 x daily - 7 x weekly - 1 sets - 2 reps - 20 sec hold - Mini Squat with Counter Support  - 1 x daily - 7 x weekly - 2 sets - 10 reps  ASSESSMENT:  CLINICAL IMPRESSION:  Ms Gainor presents to skilled PT reporting that she had a good visit with surgeon's office stating that they were pleased with her progress and she is cleared to return to work next week.  Patient cleared to progress with HEP gradually.  Patient did note some increased pain after 20 minute walk outside with only occasional use of SPC.  Provided educated to patient to listen to her body and if her knee starts to hurt, she needs to slow down on the activity.  Patient  educated on not doing too much exercise and strengthening too quickly.  Pt verbalizes understanding and states that she will progress more slowly.  Patient reported a relief of pain with manual therapy and use of massage wand on herself and able to safely demonstrate use.  Patient able to navigate steps with reciprocal pattern and demonstrate R knee MMT strength of 5/5.  Patient ready for discharge at this time to continue with HEP in order to preserve some of her remaining visits for PT for the remainder of the calendar year.  OBJECTIVE IMPAIRMENTS: Abnormal gait, decreased activity tolerance, decreased balance, decreased endurance, decreased mobility, difficulty walking, decreased ROM, decreased strength, hypomobility, increased edema, increased fascial restrictions, impaired flexibility, postural dysfunction, and pain.   ACTIVITY LIMITATIONS: standing, squatting, sleeping, stairs, transfers, bed mobility, bathing, dressing, hygiene/grooming, and locomotion level  PARTICIPATION LIMITATIONS: cleaning, laundry, driving, shopping, community activity, and yard work  PERSONAL FACTORS: Age, Fitness, Past/current experiences, Time since onset of injury/illness/exacerbation, and 3+ comorbidities: Rt THA, Lt TKA, DM, anxiety  are also affecting patient's functional outcome.   REHAB POTENTIAL: Good  CLINICAL DECISION MAKING: Evolving/moderate complexity  EVALUATION COMPLEXITY: Moderate   GOALS: Goals reviewed with patient? Yes  SHORT TERM GOALS: Target date: 02/18/22 Pt will be independent with initial HEP to improve ROM and mobility. Baseline: Goal status: Goal met 02/09/21  2.  Pt will have atleast 90 deg of Rt knee flexion Baseline:  Goal status:Goal met 02/09/21  3.  Pt will have Rt knee extension to 0 deg.  Baseline:  Goal status: MET on 02/18/2022  LONG TERM GOALS: Target date: 04/01/22  Pt will have 120 deg Rt knee flexion and 0 deg Rt knee extension Baseline: 75 deg flexion, 18 deg  ext Goal status: MET on 03/23/2022  2.  Pt will report atleast 50% improvement in Rt knee pain with ADLs. Baseline:  Goal status: MET on 03/12/2022  3.  Pt will be able to ambulate with LRAD atleast 173f with minimal compensations in her gait pattern.  Baseline:  Goal status: MET on 03/23/2022  4.  Pt will be able to ascend and descend 1 flight of stairs with/without handrails and step over pattern to allow her to get upstairs to her shower. Baseline:  Goal status: IN PROGRESS  5.  Pt will have 5/5 Rt hip/knee strength to improver her mechanics and efficiency with daily activity.  Baseline:  Goal status: IN PROGRESS    PLAN:  PT FREQUENCY:  3x/week for 4 weeks, then decrease to 1-2x/week for 4 weeks   PT DURATION: 8 weeks  PLANNED INTERVENTIONS: Therapeutic exercises, Therapeutic activity, Neuromuscular re-education, Balance training, Gait training, Patient/Family education, Self Care, Joint mobilization, Cryotherapy, Manual therapy, and Re-evaluation    PHYSICAL THERAPY DISCHARGE SUMMARY   Patient agrees to discharge. Patient goals were met. Patient is being discharged due to meeting the stated rehab goals.    SJuel Burrow PT 03/27/22 11:59 AM   [facility] [address] [place] [address]Phone # [phone] [phone]

## 2022-03-30 ENCOUNTER — Ambulatory Visit: Payer: 59 | Admitting: Rehabilitative and Restorative Service Providers"

## 2022-04-02 ENCOUNTER — Encounter: Payer: Commercial Managed Care - PPO | Admitting: Rehabilitative and Restorative Service Providers"

## 2022-04-02 ENCOUNTER — Ambulatory Visit: Payer: 59 | Admitting: Podiatry

## 2022-04-02 ENCOUNTER — Encounter: Payer: Self-pay | Admitting: Hematology and Oncology

## 2022-04-03 ENCOUNTER — Telehealth: Payer: 59 | Admitting: Plastic Surgery

## 2022-04-07 ENCOUNTER — Ambulatory Visit: Payer: Self-pay | Admitting: Podiatry

## 2022-04-07 ENCOUNTER — Ambulatory Visit (INDEPENDENT_AMBULATORY_CARE_PROVIDER_SITE_OTHER): Payer: Self-pay

## 2022-04-07 ENCOUNTER — Encounter: Payer: Self-pay | Admitting: Hematology and Oncology

## 2022-04-07 DIAGNOSIS — L603 Nail dystrophy: Secondary | ICD-10-CM

## 2022-04-07 NOTE — Patient Instructions (Signed)
Follow-up care is important to the success of your foot laser treatment. Please keep these instructions for future reference.  ° ° °Normal activity can resume immediately.  ° °2. IF you doctor prescribed an antifungal cream apply medication to the skin on the bottom of your feet, sides of your feet, between your toes, and around your nails. (This cream is not intended for use on your nails) Apply cream as directed ° °3. IF a topical for your nails was prescribed by your doctor, apply to nail/nails once daily until nail is free from infection unless otherwise directed by your doctor. Maximum use for nail topical is 12 months.  ° °4. Spray the insides of your shoes with an over the counter antifungal spray or Lysol disinfectant (aerosol can) at the end of each day or use an ultra violet shoe sterilizer as directed. Try not to wear the same shoes everyday.  ° °5. Buy new nail clippers and files since your current pair may be infected. Metal nail care instruments may also be cleaned with diluted bleach or boiling water. DO NOT share nail clippers or nail files. ° °6. Keep your toenails trimmed and clean.  ° °7. Wear flip-flops in public places especially hotel rooms and showers, athletic club locker rooms and showers and indoor swimming pools.  ° °8. Avoid nail salons that do not clean their instruments properly or use a whirlpool system.   °

## 2022-04-07 NOTE — Progress Notes (Signed)
Patient presents today for the 1st laser treatment. Diagnosed with mycotic nail infection by Dr. Milinda Pointer.   Toenail most affected bilateral 1st.  All other systems are negative.  Nails were filed thin. Laser therapy was administered to 1-5 toenails bilateral and patient tolerated the treatment well. All safety precautions were in place.   Single laser pass was done on non-affected nails 2-4.   Follow up in 4 weeks for laser # 2.

## 2022-04-09 ENCOUNTER — Ambulatory Visit: Payer: Self-pay | Admitting: Podiatry

## 2022-04-09 ENCOUNTER — Encounter: Payer: Self-pay | Admitting: Radiology

## 2022-04-16 ENCOUNTER — Other Ambulatory Visit: Payer: Self-pay

## 2022-04-16 MED ORDER — ROSUVASTATIN CALCIUM 5 MG PO TABS: 5.0000 mg | ORAL_TABLET | Freq: Every day | ORAL | 3 refills | Status: DC

## 2022-04-21 ENCOUNTER — Other Ambulatory Visit: Payer: Self-pay | Admitting: Physician Assistant

## 2022-05-05 ENCOUNTER — Ambulatory Visit (INDEPENDENT_AMBULATORY_CARE_PROVIDER_SITE_OTHER): Payer: Self-pay

## 2022-05-05 DIAGNOSIS — L603 Nail dystrophy: Secondary | ICD-10-CM | POA: Insufficient documentation

## 2022-05-05 NOTE — Progress Notes (Signed)
Patient presents today for the 2nd laser treatment. Diagnosed with mycotic nail infection by Dr. Milinda Pointer.   Toenail most affected bilateral 1st.  All other systems are negative.  Nails were filed thin. Laser therapy was administered to 1-5 toenails bilateral and patient tolerated the treatment well. All safety precautions were in place.   Single laser pass was done on non-affected nails 2-4.   Follow up in 4 weeks for laser # 3.

## 2022-05-13 ENCOUNTER — Telehealth: Payer: Self-pay | Admitting: Pharmacist

## 2022-05-13 NOTE — Telephone Encounter (Signed)
Pt called clinic and left message, has had trouble getting Mounjaro from her [facility] and [facility]. Called pt back, states her [facility] told her they have 1 box of 10mg  for her and she'll pick it up today.

## 2022-05-18 ENCOUNTER — Encounter: Payer: Self-pay | Admitting: Hematology and Oncology

## 2022-05-25 ENCOUNTER — Ambulatory Visit: Payer: 59 | Attending: Hematology and Oncology

## 2022-05-25 VITALS — Wt 175.5 lb

## 2022-05-25 DIAGNOSIS — Z483 Aftercare following surgery for neoplasm: Secondary | ICD-10-CM | POA: Insufficient documentation

## 2022-05-25 NOTE — Therapy (Signed)
OUTPATIENT PHYSICAL THERAPY SOZO SCREENING NOTE   Patient Name: Sierra Perez MRN: [id number] DOB:[birth year], 62 y.o., female Today's Date: 05/25/2022  PCP: Georgann Housekeeper, MD REFERRING PROVIDER: [name], MD   PT End of Session - 05/25/22 4098     Visit Number 18   # unchanged due to screen only   PT Start Time 0904    PT Stop Time 0908    PT Time Calculation (min) 4 min    Activity Tolerance Patient tolerated treatment well    Behavior During Therapy [facility] for tasks assessed/performed             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, her-2 positive, ER/PR negative   Clotting disorder (HCC)    testing was negative, h/o DVT while on treatment   Colon polyps    Complex regional pain syndrome i of right lower limb    RDS   Complication of anesthesia    Cystitis    Diabetes mellitus without complication (HCC)    DVT (deep venous thrombosis) (HCC)    started in 2015, multiple   Family history of breast cancer    Family history of colon cancer    Fibroid    Fibromyalgia    GERD (gastroesophageal reflux disease)    Heart murmur    HLD (hyperlipidemia)    HPV in female    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   Hypertension    Hypothyroidism    IBS (irritable bowel syndrome)    Mitral valve prolapse    Peptic ulcer    Personal history of malignant neoplasm of breast    Sleep apnea treated with continuous positive airway pressure (CPAP)    Past Surgical History:  Procedure Laterality Date   ABDOMINAL HYSTERECTOMY  2010   fibroids, h/o abnormal pap smears   AXILLARY SENTINEL NODE BIOPSY Right 07/08/2020   Procedure: RIGHT AXILLARY SENTINEL NODE BIOPSY;  Surgeon: Emelia Loron, MD;  Location: [facility] OR;  Service: General;  Laterality: Right;   BREAST RECONSTRUCTION WITH PLACEMENT OF TISSUE EXPANDER AND FLEX HD (ACELLULAR HYDRATED DERMIS) Bilateral  07/08/2020   Procedure: BILATERAL BREAST RECONSTRUCTION WITH  FLEX HD (ACELLULAR HYDRATED DERMIS),DIRECT IMPLANT RECONSTRUCTION;  Surgeon: Allena Napoleon, MD;  Location: [facility] OR;  Service: Plastics;  Laterality: Bilateral;   BREAST SURGERY Right 2015   right lumpectomy with reduction and lift   COLONOSCOPY     DENTAL SURGERY     ESOPHAGOGASTRODUODENOSCOPY     LAPAROSCOPIC GASTRIC SLEEVE RESECTION  2014   LAPAROSCOPIC OOPHERECTOMY Bilateral 2016   REPLACEMENT TOTAL KNEE Left 2018   also had 3 prior arthroscopies   TOE SURGERY Right    dislocated toe   TOTAL HIP ARTHROPLASTY Right 11/19/2020   Procedure: RIGHT TOTAL HIP ARTHROPLASTY ANTERIOR APPROACH;  Surgeon: Kathryne Hitch, MD;  Location: [facility] OR;  Service: Orthopedics;  Laterality: Right;   TOTAL KNEE ARTHROPLASTY Right 01/09/2022   Procedure: RIGHT TOTAL KNEE ARTHROPLASTY;  Surgeon: Kathryne Hitch, MD;  Location: [facility] ORS;  Service: Orthopedics;  Laterality: Right;   TOTAL MASTECTOMY Bilateral 07/08/2020   Procedure: BILATERAL TOTAL MASTECTOMY;  Surgeon: Emelia Loron, MD;  Location: [facility] OR;  Service: General;  Laterality: Bilateral;   VENA CAVA FILTER PLACEMENT  2017   Patient Active Problem List   Diagnosis Date Noted  Nail dystrophy 05/05/2022   Status post total right knee replacement 01/09/2022   Obesity (BMI 30-39.9) 06/23/2021   Iron deficiency anemia 01/23/2021   Status post right hip replacement 12/03/2020   Status post total replacement of right hip 11/19/2020   Breast cancer 07/08/2020   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   H/O total hysterectomy 06/11/2020   Genetic testing 05/27/2020   Chronic superficial gastritis 05/27/2020   Fibromyalgia 05/27/2020   GERD (gastroesophageal reflux disease) 05/27/2020   Prediabetes 05/27/2020   Complex regional pain syndrome I, unspecified 05/27/2020   Atypical lobular hyperplasia [facility]) of left breast 05/23/2020   History of therapeutic radiation 05/23/2020    Family history of breast cancer    Family history of colon cancer    Personal history of malignant neoplasm of breast    Unilateral primary osteoarthritis, right knee 02/26/2020   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   DVT (deep venous thrombosis) 05/04/2018   Malignant neoplasm of upper-outer quadrant of right breast in female, estrogen receptor negative 05/03/2018   Obstructive sleep apnea (adult) (pediatric) 09/14/2017   Adenopathy 07/15/2017   B12 deficiency 07/15/2017   Disorder of tympanic membrane of right ear 07/15/2017   Recurrent UTI 03/09/2017   Bladder pain 02/25/2017   Proteinuria 02/25/2017   Hematoma of thigh, left, subsequent encounter 08/16/2016   At risk for obstructive sleep apnea 07/01/2016   Suspected sleep apnea 07/01/2016   Arthritis of left knee 05/26/2016   Localized primary osteoarthritis of lower leg, left 05/26/2016   Clotting disorder 05/22/2016   Chronic anticoagulation 01/06/2016   Right-sided epistaxis 01/06/2016   Urinary tract infection, site not specified 08/12/2015   Abnormal Pap smear of vagina 05/06/2015   Follow-up examination, following other surgery 12/14/2013   Pelvic and perineal pain 11/28/2013   Anxiety and depression 11/16/2013   H/O bariatric surgery 11/16/2013   Atrophic vaginitis 11/13/2013   Tear of lateral cartilage or meniscus of knee, current 04/14/2013   Allergic rhinitis 01/16/2013   Female genital symptoms 11/10/2012   Symptomatic menopausal or female climacteric states 11/10/2012   Disequilibrium 06/09/2012   Morbid obesity 02/09/2012   Vitamin D deficiency 09/13/2008   Preop examination 04/20/2008   Hypothyroidism 05/20/2007    REFERRING DIAG: right breast cancer at risk for lymphedema  THERAPY DIAG:  Aftercare following surgery for neoplasm  PERTINENT HISTORY: Rt THA 2022, Rt TKA 01/09/22, Lt TKA 2018, Hx of Breast Cancer s/p double mastectomy with sentinel lymph node removed from right side   PRECAUTIONS: right UE  Lymphedema risk, None  SUBJECTIVE: Pt returns for her  month L-Dex screens.   PAIN:  Are you having pain? Yes: NPRS scale: 8/10 Pain location: plantar surface Pain description: Lt Aggravating factors: walking Relieving factors: rest  SOZO SCREENING: Patient was assessed today using the SOZO machine to determine the lymphedema index score. This was compared to her baseline score. It was determined that she is within the recommended range when compared to her baseline and no further action is needed at this time. She will continue SOZO screenings. These are done every 3 months for 2 years post operatively followed by every 6 months for 2 years, and then annually.   L-DEX FLOWSHEETS - 05/25/22 0900       L-DEX LYMPHEDEMA SCREENING   Measurement Type Unilateral    L-DEX MEASUREMENT EXTREMITY Upper Extremity    POSITION  Standing    DOMINANT SIDE Left    At Risk Side Right    BASELINE SCORE (UNILATERAL) 5.9  L-DEX SCORE (UNILATERAL) 6.6    VALUE CHANGE (UNILAT) 0.7               Hermenia Bers, PTA 05/25/2022, 9:11 AM

## 2022-05-28 ENCOUNTER — Encounter: Payer: Self-pay | Admitting: Hematology and Oncology

## 2022-06-05 ENCOUNTER — Ambulatory Visit (INDEPENDENT_AMBULATORY_CARE_PROVIDER_SITE_OTHER): Payer: Self-pay

## 2022-06-05 ENCOUNTER — Telehealth: Payer: Self-pay | Admitting: Pharmacist

## 2022-06-05 DIAGNOSIS — L603 Nail dystrophy: Secondary | ICD-10-CM

## 2022-06-05 MED ORDER — MOUNJARO 10 MG/0.5ML ~~LOC~~ SOAJ: 10.0000 mg | SUBCUTANEOUS | 0 refills | Status: DC

## 2022-06-05 NOTE — Progress Notes (Signed)
Patient presents today for the 3rd laser treatment. Diagnosed with mycotic nail infection by Dr. Al Corpus.   Toenail most affected bilateral 1st.  All other systems are negative.  Nails were filed thin. Laser therapy was administered to 1-5 toenails bilateral and patient tolerated the treatment well. All safety precautions were in place.   Single laser pass was done on non-affected nails 2-4.   Follow up in 6 weeks for laser # 4.

## 2022-06-05 NOTE — Telephone Encounter (Signed)
Pt called clinic, issues getting Mounjaro. [facility] in [place] has 1 box, rx sent in for her.

## 2022-07-06 ENCOUNTER — Telehealth: Payer: Self-pay | Admitting: Pharmacist

## 2022-07-06 MED ORDER — MOUNJARO 12.5 MG/0.5ML ~~LOC~~ SOAJ: 12.5000 mg | SUBCUTANEOUS | 0 refills | Status: DC

## 2022-07-06 NOTE — Telephone Encounter (Signed)
Pt left message asking to increase her Mounjaro dose as she's been noticing plateau in weight loss and increased appetite again.   Spoke with pt, rx sent in for 12.5mg  dose. She will let me know how she tolerates this, can increase to 15mg  dose in a month if tolerating well.

## 2022-07-16 ENCOUNTER — Emergency Department (HOSPITAL_BASED_OUTPATIENT_CLINIC_OR_DEPARTMENT_OTHER)
Admission: EM | Admit: 2022-07-16 | Discharge: 2022-07-16 | Disposition: A | Discharge status: Home / Self Care | Payer: 59 | Source: Home / Self Care | Attending: Emergency Medicine | Admitting: Emergency Medicine

## 2022-07-16 ENCOUNTER — Emergency Department (HOSPITAL_BASED_OUTPATIENT_CLINIC_OR_DEPARTMENT_OTHER): Payer: 59 | Admitting: Radiology

## 2022-07-16 ENCOUNTER — Emergency Department (HOSPITAL_BASED_OUTPATIENT_CLINIC_OR_DEPARTMENT_OTHER): Payer: 59

## 2022-07-16 ENCOUNTER — Other Ambulatory Visit: Payer: Self-pay

## 2022-07-16 ENCOUNTER — Encounter (HOSPITAL_BASED_OUTPATIENT_CLINIC_OR_DEPARTMENT_OTHER): Payer: Self-pay | Admitting: Emergency Medicine

## 2022-07-16 DIAGNOSIS — R072 Precordial pain: Secondary | ICD-10-CM | POA: Insufficient documentation

## 2022-07-16 DIAGNOSIS — R079 Chest pain, unspecified: Secondary | ICD-10-CM | POA: Diagnosis not present

## 2022-07-16 DIAGNOSIS — Z7901 Long term (current) use of anticoagulants: Secondary | ICD-10-CM | POA: Insufficient documentation

## 2022-07-16 DIAGNOSIS — R0789 Other chest pain: Secondary | ICD-10-CM

## 2022-07-16 DIAGNOSIS — I300 Acute nonspecific idiopathic pericarditis: Secondary | ICD-10-CM | POA: Diagnosis not present

## 2022-07-16 DIAGNOSIS — Z853 Personal history of malignant neoplasm of breast: Secondary | ICD-10-CM | POA: Insufficient documentation

## 2022-07-16 DIAGNOSIS — R9431 Abnormal electrocardiogram [ECG] [EKG]: Secondary | ICD-10-CM | POA: Diagnosis not present

## 2022-07-16 LAB — CBC
HCT: 33.2 % — ABNORMAL LOW (ref 36.0–46.0)
Hemoglobin: 11.2 g/dL — ABNORMAL LOW (ref 12.0–15.0)
MCH: 30.1 pg (ref 26.0–34.0)
MCHC: 33.7 g/dL (ref 30.0–36.0)
MCV: 89.2 fL (ref 80.0–100.0)
Platelets: 227 10*3/uL (ref 150–400)
RBC: 3.72 MIL/uL — ABNORMAL LOW (ref 3.87–5.11)
RDW: 14.3 % (ref 11.5–15.5)
WBC: 7.7 10*3/uL (ref 4.0–10.5)
nRBC: 0 % (ref 0.0–0.2)

## 2022-07-16 LAB — BASIC METABOLIC PANEL
Anion gap: 10 (ref 5–15)
BUN: 24 mg/dL — ABNORMAL HIGH (ref 8–23)
CO2: 24 mmol/L (ref 22–32)
Calcium: 9.5 mg/dL (ref 8.9–10.3)
Chloride: 107 mmol/L (ref 98–111)
Creatinine, Ser: 0.88 mg/dL (ref 0.44–1.00)
GFR, Estimated: >60 mL/min (ref >60)
Glucose, Bld: 80 mg/dL (ref 70–99)
Potassium: 3.5 mmol/L (ref 3.5–5.1)
Sodium: 141 mmol/L (ref 135–145)

## 2022-07-16 LAB — TROPONIN I (HIGH SENSITIVITY)
Troponin I (High Sensitivity): 3 ng/L (ref <18)
Troponin I (High Sensitivity): 3 ng/L (ref <18)

## 2022-07-16 MED ORDER — LIDOCAINE 5 % EX PTCH
1.0000 | MEDICATED_PATCH | Freq: Once | CUTANEOUS | Status: DC
  Administered 2022-07-16: 2 via TRANSDERMAL
  Filled 2022-07-16: qty 2

## 2022-07-16 MED ORDER — LIDOCAINE 5 % EX PTCH: 1.0000 | MEDICATED_PATCH | CUTANEOUS | 0 refills | Status: DC

## 2022-07-16 MED ORDER — ACETAMINOPHEN 500 MG PO TABS
1000.0000 mg | ORAL_TABLET | Freq: Once | ORAL | Status: AC
  Administered 2022-07-16: 1000 mg via ORAL
  Filled 2022-07-16: qty 2

## 2022-07-16 MED ORDER — IOHEXOL 350 MG/ML SOLN
75.0000 mL | Freq: Once | INTRAVENOUS | Status: AC | PRN
  Administered 2022-07-16: 75 mL via INTRAVENOUS

## 2022-07-16 NOTE — ED Notes (Signed)
Pt return from CT.

## 2022-07-16 NOTE — ED Triage Notes (Signed)
Patient here POV from Home.  Notes CP mostly to Mid Chest for about 5 Hours. Worse when flat and with Deep inspiration. No Discernable SOB. No N/V.   NAD Noted during Triage. A&Ox4. GCS 15. Ambulatory.

## 2022-07-16 NOTE — Discharge Instructions (Addendum)
You were seen in the emergency department for your chest pain.  Your workup showed no signs of heart attack or stress on your heart and no signs of blood clots or fluid in your lungs.  You likely strained the muscles in your chest from your recent heavy lifting and can take Tylenol up to every 6 hours as needed for pain as well as use the lidocaine patches.  You can also take the muscle relaxers as needed.  This can make you drowsy so do not take it before driving, working or operating heavy machinery.  You should try to refrain from heavy lifting more than 10 pounds over the next several days to help your muscles heal.  You should follow-up with your primary doctor next week as planned.  You should return to the emergency department if the pain gets significantly worse, you have severe shortness of breath, you pass out or if you have any other new or concerning symptoms.

## 2022-07-16 NOTE — ED Provider Notes (Signed)
[place] EMERGENCY DEPARTMENT AT [facility] Provider Note   CSN: [id number] Arrival date & time: 07/16/22  1905     History  Chief Complaint  Patient presents with   Chest Pain    Sierra Perez is a 62 y.o. female.  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  Patient reports that earlier this afternoon she was bending over and started to feel some midsternal chest pain.  She states that she noticed that the pain got worse whether she bent forward or laid back or is moving side-to-side.  She denies any shortness of breath but states that the pain is worse with taking deep breaths.  She denies any fever or cough.  She denies any lower extremity swelling.  She states that she does have a new job for the last 2 weeks where she has to do a lot of heavy lifting and moving.  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  She states that she does have decreased chest wall sensation since her double mastectomy.  The history is provided by the patient and a relative.  Chest Pain      Home Medications Prior to Admission medications   Medication Sig Start Date End Date Taking? Authorizing Provider  lidocaine (LIDODERM) 5 % Place 1 patch onto the skin daily. Remove & Discard patch within 12 hours or as directed by MD 07/16/22  Yes Theresia Lo, Cecile Sheerer, DO  acetaminophen (TYLENOL) 500 MG tablet Take 1,000 mg by mouth every 6 (six) hours as needed for moderate pain.    [provider]  albuterol (VENTOLIN HFA) 108 (90 Base) MCG/ACT inhaler Inhale 1 puff into the lungs every 6 (six) hours as needed for wheezing or shortness of breath. 01/18/19   [provider]  amLODipine (NORVASC) 5 MG tablet Take 5 mg by mouth daily. 06/05/20   [provider]  anastrozole (ARIMIDEX) 1 MG tablet TAKE 1 TABLET(1 MG) BY MOUTH DAILY 02/19/22   [name], MD  CALCIUM CITRATE PO Take 2 tablets by mouth daily.    [provider]  Cholecalciferol (VITAMIN D-3) 25 MCG (1000 UT) CAPS Take 1,000 Units by mouth daily.    [provider]  cyclobenzaprine (FLEXERIL) 10 MG tablet Take 1 tablet (10 mg total) by mouth at bedtime. 03/02/22   Kirtland Bouchard, PA-C  DULoxetine (CYMBALTA) 30 MG capsule 30 mg daily. 08/01/20   [provider]  DULoxetine (CYMBALTA) 60 MG capsule Take 60 mg by mouth daily.    [provider]  ELIQUIS 5 MG TABS tablet TAKE 1 TABLET(5 MG) BY MOUTH TWICE DAILY 01/19/22   [name], MD  fluticasone (FLONASE) 50 MCG/ACT nasal spray Place 2 sprays into both nostrils daily.    [provider]  gabapentin (NEURONTIN) 600 MG tablet TAKE 1 TABLET(600 MG) BY MOUTH THREE TIMES DAILY 04/21/22   Kirtland Bouchard, PA-C  ipratropium (ATROVENT) 0.03 % nasal spray Place 2 sprays into both nostrils 2 (two) times daily. 05/21/19   [provider]  Lactobacillus Rhamnosus, GG, (CULTURELLE PO) Take 1 capsule by mouth daily.    [provider]  levothyroxine (SYNTHROID) 50 MCG tablet Take 50 mcg by mouth every morning. 05/18/19   [provider]  magic mouthwash  SOLN Swish and spit twice daily 01/23/22   Kirtland Bouchard, PA-C  NON FORMULARY Pt uses a cpap nightly    [provider]  ondansetron (ZOFRAN) 4 MG tablet Take 1 tablet (4 mg total) by mouth every 8 (eight) hours as needed for nausea or vomiting. 01/22/22   Kirtland Bouchard, PA-C  pantoprazole (PROTONIX) 40 MG tablet Take 1 tablet (40 mg total) by mouth daily. 11/09/18   [name], MD  rosuvastatin (CRESTOR) 5 MG tablet Take 1 tablet (5 mg total) by mouth daily. 04/16/22   Bensimhon, Bevelyn Buckles, MD  tirzepatide [facility]) 12.5 MG/0.5ML Pen Inject 12.5 mg into the skin once a week. 07/06/22   Bensimhon, Bevelyn Buckles, MD  valACYclovir (VALTREX) 500 MG tablet Take 1 tablet (500 mg total) by mouth daily. Increase to bid x 3 days with symptoms Patient taking differently: Take 500 mg by mouth daily as needed (outbreaks). Increase to bid x 3 days with symptoms 08/14/21   [name], MD      Allergies    Succinylcholine, Azithromycin, Keflex [cephalexin], Silver, Sulfa antibiotics, and Sulfasalazine    Review of Systems   Review of Systems  Cardiovascular:  Positive for chest pain.    Physical Exam Updated Vital Signs BP (!) 122/59 (BP Location: Left Arm)   Pulse 66   Temp 98 F (36.7 C) (Oral)   Resp 18   Ht 5\' 8"  (1.727 m)   Wt 77.1 kg   SpO2 100%   BMI 25.85 kg/m  Physical Exam Vitals and nursing note reviewed.  Constitutional:      General: She is not in acute distress.    Appearance: She is well-developed.  HENT:     Head: Normocephalic and atraumatic.  Eyes:     Extraocular Movements: Extraocular movements intact.  Cardiovascular:     Rate and Rhythm: Normal rate and regular rhythm.     Pulses:          Radial pulses are 2+ on the right side and 2+ on the left side.       Dorsalis pedis pulses are 2+ on the right side and 2+ on the left side.     Heart sounds: Normal heart sounds.  Pulmonary:     Breath sounds: Normal breath sounds.  Chest:     Chest wall: Tenderness (Right sided parasternal chest wall tenderness to palp patient) present.  Abdominal:     Palpations: Abdomen is soft.     Tenderness: There is no abdominal tenderness.  Musculoskeletal:        General: Normal range of motion.     Cervical back: Normal range of motion and neck supple.     Right lower leg: No edema.     Left lower leg: No edema.  Skin:    General: Skin is warm and dry.  Neurological:     General: No focal deficit present.     Mental Status: She is alert and oriented to person, place, and time.  Psychiatric:        Mood and Affect: Mood normal.        Behavior: Behavior normal.     ED  Results / Procedures / Treatments   Labs (all labs ordered are listed, but only abnormal results are displayed) Labs Reviewed  BASIC METABOLIC PANEL - Abnormal; Notable for the following components:      Result Value   BUN 24 (*)    All other components within normal limits  CBC - Abnormal; Notable for the following components:   RBC 3.72 (*)    Hemoglobin 11.2 (*)    HCT 33.2 (*)    All other components within normal limits  TROPONIN I (HIGH SENSITIVITY)  TROPONIN I (HIGH SENSITIVITY)  TROPONIN I (HIGH SENSITIVITY)    EKG EKG Interpretation  Date/Time:  Thursday July 16 2022 19:14:33 EDT Ventricular Rate:  84 PR Interval:  144 QRS Duration: 88 QT Interval:  352 QTC Calculation: 415 R Axis:   40 Text Interpretation: Normal sinus rhythm Low voltage QRS Cannot rule out Anterior infarct , age undetermined Abnormal ECG When compared with ECG of 05-Feb-2021 14:25, No significant change was found Confirmed by Elayne Snare ([id number]) on 07/16/2022 9:42:02 PM  Radiology CT Angio Chest PE W and/or Wo Contrast  Result Date: 07/16/2022 CLINICAL DATA:  Chest pain. EXAM: CT ANGIOGRAPHY CHEST WITH CONTRAST TECHNIQUE: Multidetector CT imaging of the chest was performed using the standard protocol during bolus administration of intravenous contrast. Multiplanar CT image reconstructions and MIPs were obtained to evaluate the vascular anatomy. RADIATION DOSE REDUCTION: This exam was performed according to the departmental dose-optimization program which includes automated exposure control, adjustment of the mA and/or kV according to patient size and/or use of iterative reconstruction technique. CONTRAST:  75mL OMNIPAQUE IOHEXOL 350 MG/ML SOLN COMPARISON:  March 04, 2021 FINDINGS: Cardiovascular: The thoracic aorta is normal in appearance. Satisfactory opacification of the pulmonary arteries to the segmental level. No evidence of pulmonary embolism. Normal heart size. No pericardial effusion.  Mediastinum/Nodes: No enlarged mediastinal, hilar, or axillary lymph nodes. Thyroid gland, trachea, and esophagus demonstrate no significant findings. Lungs/Pleura: A stable 3 mm noncalcified lung nodule is seen within the anterolateral aspect of the right lower lobe (axial CT image 79, CT series 6). There is no evidence of an acute infiltrate, pleural effusion or pneumothorax. Upper Abdomen: Surgical sutures are seen within the gastric region. Musculoskeletal: Bilateral breast implants are seen. No acute osseous abnormalities are identified. Review of the MIP images confirms the above findings. IMPRESSION: 1. No evidence of pulmonary embolism or acute cardiopulmonary disease. 2. Stable 3 mm noncalcified right lower lobe lung nodule. No follow-up needed if patient is low-risk.This recommendation follows the consensus statement: Guidelines for Management of Incidental Pulmonary Nodules Detected on CT Images: From the Fleischner Society 2017; Radiology 2017; 284:228-243. Electronically Signed   By: Aram Candela M.D.   On: 07/16/2022 22:31   DG Chest 2 View  Result Date: 07/16/2022 CLINICAL DATA:  Chest pain. EXAM: CHEST - 2 VIEW COMPARISON:  Cardiac CT 03/04/2021 FINDINGS: The cardiomediastinal contours are normal. The lungs are clear. Pulmonary vasculature is normal. No consolidation, pleural effusion, or pneumothorax. No acute osseous abnormalities are seen. Added density over the right lung bases related to overlying breast implant. IMPRESSION: No acute chest findings. Electronically Signed   By: Narda Rutherford M.D.   On: 07/16/2022 19:52    Procedures Procedures    Medications Ordered in ED Medications  acetaminophen (TYLENOL) tablet 1,000 mg (has no administration in time range)  lidocaine (LIDODERM) 5 % 1-3 patch (has no administration in time range)  iohexol (OMNIPAQUE) 350 MG/ML injection 75 mL (75 mLs Intravenous Contrast Given 07/16/22 2211)    ED Course/ Medical Decision Making/  A&P Clinical Course as of 07/16/22 2324  Thu Jul 16, 2022  2248 No evidence of PE on CTPE. Pain likely muscular with recent increased heavy lifting. She is stable for discharge home with outpatient follow up. [VK]  Clinical Course User Index [VK] Rexford Maus, DO                             Medical Decision Making This patient presents to the ED with chief complaint(s) of chest pain with pertinent past medical history of prior breast cancer, DVT on Eliquis which further complicates the presenting complaint. The complaint involves an extensive differential diagnosis and also carries with it a high risk of complications and morbidity.    The differential diagnosis includes ACS, arrhythmia, anemia, pneumonia, pneumothorax, pulmonary edema, pleural effusion, considering PE though she is anticoagulated with her history of recurrent DVTs and cancer, considering muscle strain or costochondritis  Additional history obtained: Additional history obtained from family Records reviewed outpatient oncology records  ED Course and Reassessment: Patient's arrival to the emergency department she was hemodynamically stable in no acute distress.  EKG on arrival showed normal sinus rhythm without acute ischemic changes.  She was initially evaluated by triage and had labs including troponin and chest x-ray performed.  Initial troponin was negative and the patient has had several hours of ongoing chest pain making ACS unlikely.  The patient's labs and chest x-ray are otherwise within normal range.  Due to patient's history of DVT also concern for PE and discussed with the patient D-dimer versus CT angio and she would prefer to go straight to CT angio which is reasonable due to her high risk criteria.  He denied any pain medication at this time.  Independent labs interpretation:  The following labs were independently interpreted: Within normal range  Independent visualization of imaging: - I  independently visualized the following imaging with scope of interpretation limited to determining acute life threatening conditions related to emergency care: Chest x-ray, CT PE, which revealed no acute disease  Consultation: - Consulted or discussed management/test interpretation w/ external professional: N/A  Consideration for admission or further workup: Patient has no emergent conditions requiring admission or further work-up at this time and is stable for discharge home with primary care follow-up  Social Determinants of health: N/A    Amount and/or Complexity of Data Reviewed Labs: ordered. Radiology: ordered.  Risk Prescription drug management.          Final Clinical Impression(s) / ED Diagnoses Final diagnoses:  Chest wall pain    Rx / DC Orders ED Discharge Orders          Ordered    lidocaine (LIDODERM) 5 %  Every 24 hours        07/16/22 2301              Rexford Maus, DO 07/16/22 2324

## 2022-07-17 ENCOUNTER — Ambulatory Visit (INDEPENDENT_AMBULATORY_CARE_PROVIDER_SITE_OTHER): Payer: Self-pay

## 2022-07-17 DIAGNOSIS — L603 Nail dystrophy: Secondary | ICD-10-CM

## 2022-07-17 NOTE — Progress Notes (Signed)
Patient presents today for the 4th laser treatment. Diagnosed with mycotic nail infection by Dr. Al Corpus.   Toenail most affected bilateral 1st.  All other systems are negative.  Nails were filed thin. Laser therapy was administered to 1-5 toenails bilateral and patient tolerated the treatment well. All safety precautions were in place.   Single laser pass was done on non-affected nails 2-4.   Follow up in 6 weeks for laser # 5

## 2022-07-18 ENCOUNTER — Other Ambulatory Visit: Payer: Self-pay

## 2022-07-18 ENCOUNTER — Encounter (HOSPITAL_COMMUNITY): Payer: Self-pay

## 2022-07-18 ENCOUNTER — Inpatient Hospital Stay (HOSPITAL_COMMUNITY)
Admission: EM | Disposition: A | Discharge status: Home / Self Care | Payer: Self-pay | Source: Home / Self Care | Attending: Cardiology

## 2022-07-18 ENCOUNTER — Emergency Department (HOSPITAL_COMMUNITY): Payer: 59

## 2022-07-18 ENCOUNTER — Inpatient Hospital Stay (HOSPITAL_COMMUNITY)
Admission: EM | Admit: 2022-07-18 | Discharge: 2022-07-22 | DRG: 287 | Disposition: A | Discharge status: Home / Self Care | Payer: 59 | Attending: Cardiology | Admitting: Cardiology

## 2022-07-18 DIAGNOSIS — Z833 Family history of diabetes mellitus: Secondary | ICD-10-CM

## 2022-07-18 DIAGNOSIS — M1990 Unspecified osteoarthritis, unspecified site: Secondary | ICD-10-CM | POA: Diagnosis present

## 2022-07-18 DIAGNOSIS — Z90710 Acquired absence of both cervix and uterus: Secondary | ICD-10-CM | POA: Diagnosis not present

## 2022-07-18 DIAGNOSIS — M797 Fibromyalgia: Secondary | ICD-10-CM | POA: Diagnosis present

## 2022-07-18 DIAGNOSIS — Z5181 Encounter for therapeutic drug level monitoring: Secondary | ICD-10-CM | POA: Diagnosis not present

## 2022-07-18 DIAGNOSIS — Z883 Allergy status to other anti-infective agents status: Secondary | ICD-10-CM

## 2022-07-18 DIAGNOSIS — E860 Dehydration: Secondary | ICD-10-CM | POA: Diagnosis present

## 2022-07-18 DIAGNOSIS — Z803 Family history of malignant neoplasm of breast: Secondary | ICD-10-CM

## 2022-07-18 DIAGNOSIS — G4730 Sleep apnea, unspecified: Secondary | ICD-10-CM | POA: Diagnosis present

## 2022-07-18 DIAGNOSIS — R9431 Abnormal electrocardiogram [ECG] [EKG]: Secondary | ICD-10-CM | POA: Diagnosis not present

## 2022-07-18 DIAGNOSIS — Z7901 Long term (current) use of anticoagulants: Secondary | ICD-10-CM | POA: Diagnosis not present

## 2022-07-18 DIAGNOSIS — Z884 Allergy status to anesthetic agent status: Secondary | ICD-10-CM

## 2022-07-18 DIAGNOSIS — Z96641 Presence of right artificial hip joint: Secondary | ICD-10-CM | POA: Diagnosis present

## 2022-07-18 DIAGNOSIS — Z90722 Acquired absence of ovaries, bilateral: Secondary | ICD-10-CM

## 2022-07-18 DIAGNOSIS — Z91048 Other nonmedicinal substance allergy status: Secondary | ICD-10-CM

## 2022-07-18 DIAGNOSIS — Z9884 Bariatric surgery status: Secondary | ICD-10-CM

## 2022-07-18 DIAGNOSIS — Z882 Allergy status to sulfonamides status: Secondary | ICD-10-CM

## 2022-07-18 DIAGNOSIS — Z86718 Personal history of other venous thrombosis and embolism: Secondary | ICD-10-CM

## 2022-07-18 DIAGNOSIS — Z95828 Presence of other vascular implants and grafts: Secondary | ICD-10-CM | POA: Diagnosis not present

## 2022-07-18 DIAGNOSIS — Z96653 Presence of artificial knee joint, bilateral: Secondary | ICD-10-CM | POA: Diagnosis present

## 2022-07-18 DIAGNOSIS — E785 Hyperlipidemia, unspecified: Secondary | ICD-10-CM | POA: Diagnosis present

## 2022-07-18 DIAGNOSIS — A609 Anogenital herpesviral infection, unspecified: Secondary | ICD-10-CM | POA: Diagnosis present

## 2022-07-18 DIAGNOSIS — K219 Gastro-esophageal reflux disease without esophagitis: Secondary | ICD-10-CM | POA: Diagnosis present

## 2022-07-18 DIAGNOSIS — Z7985 Long-term (current) use of injectable non-insulin antidiabetic drugs: Secondary | ICD-10-CM

## 2022-07-18 DIAGNOSIS — I300 Acute nonspecific idiopathic pericarditis: Secondary | ICD-10-CM | POA: Diagnosis present

## 2022-07-18 DIAGNOSIS — I319 Disease of pericardium, unspecified: Secondary | ICD-10-CM | POA: Clinically undetermined

## 2022-07-18 DIAGNOSIS — Z9882 Breast implant status: Secondary | ICD-10-CM

## 2022-07-18 DIAGNOSIS — E119 Type 2 diabetes mellitus without complications: Secondary | ICD-10-CM | POA: Diagnosis present

## 2022-07-18 DIAGNOSIS — I2111 ST elevation (STEMI) myocardial infarction involving right coronary artery: Principal | ICD-10-CM

## 2022-07-18 DIAGNOSIS — Z853 Personal history of malignant neoplasm of breast: Secondary | ICD-10-CM

## 2022-07-18 DIAGNOSIS — I3139 Other pericardial effusion (noninflammatory): Secondary | ICD-10-CM | POA: Diagnosis present

## 2022-07-18 DIAGNOSIS — I308 Other forms of acute pericarditis: Secondary | ICD-10-CM | POA: Diagnosis not present

## 2022-07-18 DIAGNOSIS — I10 Essential (primary) hypertension: Secondary | ICD-10-CM | POA: Diagnosis present

## 2022-07-18 DIAGNOSIS — I480 Paroxysmal atrial fibrillation: Secondary | ICD-10-CM | POA: Diagnosis not present

## 2022-07-18 DIAGNOSIS — Z79890 Hormone replacement therapy (postmenopausal): Secondary | ICD-10-CM

## 2022-07-18 DIAGNOSIS — F419 Anxiety disorder, unspecified: Secondary | ICD-10-CM | POA: Diagnosis present

## 2022-07-18 DIAGNOSIS — I309 Acute pericarditis, unspecified: Secondary | ICD-10-CM | POA: Diagnosis not present

## 2022-07-18 DIAGNOSIS — Z9013 Acquired absence of bilateral breasts and nipples: Secondary | ICD-10-CM

## 2022-07-18 DIAGNOSIS — R7982 Elevated C-reactive protein (CRP): Secondary | ICD-10-CM | POA: Diagnosis present

## 2022-07-18 DIAGNOSIS — R000 Tachycardia, unspecified: Secondary | ICD-10-CM | POA: Diagnosis not present

## 2022-07-18 DIAGNOSIS — E039 Hypothyroidism, unspecified: Secondary | ICD-10-CM | POA: Diagnosis present

## 2022-07-18 DIAGNOSIS — R079 Chest pain, unspecified: Secondary | ICD-10-CM | POA: Diagnosis present

## 2022-07-18 DIAGNOSIS — I4891 Unspecified atrial fibrillation: Secondary | ICD-10-CM | POA: Insufficient documentation

## 2022-07-18 DIAGNOSIS — Z79899 Other long term (current) drug therapy: Secondary | ICD-10-CM

## 2022-07-18 DIAGNOSIS — Z800 Family history of malignant neoplasm of digestive organs: Secondary | ICD-10-CM | POA: Diagnosis not present

## 2022-07-18 DIAGNOSIS — D689 Coagulation defect, unspecified: Secondary | ICD-10-CM | POA: Diagnosis present

## 2022-07-18 DIAGNOSIS — Z8249 Family history of ischemic heart disease and other diseases of the circulatory system: Secondary | ICD-10-CM

## 2022-07-18 DIAGNOSIS — N644 Mastodynia: Secondary | ICD-10-CM | POA: Diagnosis present

## 2022-07-18 DIAGNOSIS — I82409 Acute embolism and thrombosis of unspecified deep veins of unspecified lower extremity: Secondary | ICD-10-CM | POA: Diagnosis present

## 2022-07-18 HISTORY — PX: LEFT HEART CATH AND CORONARY ANGIOGRAPHY: CATH118249

## 2022-07-18 LAB — COMPREHENSIVE METABOLIC PANEL
ALT: 76 U/L — ABNORMAL HIGH (ref 0–44)
AST: 79 U/L — ABNORMAL HIGH (ref 15–41)
Albumin: 3.8 g/dL (ref 3.5–5.0)
Alkaline Phosphatase: 65 U/L (ref 38–126)
Anion gap: 10 (ref 5–15)
BUN: 28 mg/dL — ABNORMAL HIGH (ref 8–23)
CO2: 20 mmol/L — ABNORMAL LOW (ref 22–32)
Calcium: 9.4 mg/dL (ref 8.9–10.3)
Chloride: 105 mmol/L (ref 98–111)
Creatinine, Ser: 1.25 mg/dL — ABNORMAL HIGH (ref 0.44–1.00)
GFR, Estimated: 49 mL/min — ABNORMAL LOW (ref >60)
Glucose, Bld: 154 mg/dL — ABNORMAL HIGH (ref 70–99)
Potassium: 5 mmol/L (ref 3.5–5.1)
Sodium: 135 mmol/L (ref 135–145)
Total Bilirubin: 2.1 mg/dL — ABNORMAL HIGH (ref 0.3–1.2)
Total Protein: 6.4 g/dL — ABNORMAL LOW (ref 6.5–8.1)

## 2022-07-18 LAB — CBC WITH DIFFERENTIAL/PLATELET
Abs Immature Granulocytes: 0.05 10*3/uL (ref 0.00–0.07)
Basophils Absolute: 0 10*3/uL (ref 0.0–0.1)
Basophils Relative: 0 %
Eosinophils Absolute: 0 10*3/uL (ref 0.0–0.5)
Eosinophils Relative: 0 %
HCT: 38.2 % (ref 36.0–46.0)
Hemoglobin: 12.7 g/dL (ref 12.0–15.0)
Immature Granulocytes: 0 %
Lymphocytes Relative: 9 %
Lymphs Abs: 1 10*3/uL (ref 0.7–4.0)
MCH: 30.5 pg (ref 26.0–34.0)
MCHC: 33.2 g/dL (ref 30.0–36.0)
MCV: 91.6 fL (ref 80.0–100.0)
Monocytes Absolute: 0.9 10*3/uL (ref 0.1–1.0)
Monocytes Relative: 7 %
Neutro Abs: 9.8 10*3/uL — ABNORMAL HIGH (ref 1.7–7.7)
Neutrophils Relative %: 84 %
Platelets: 203 10*3/uL (ref 150–400)
RBC: 4.17 MIL/uL (ref 3.87–5.11)
RDW: 14.4 % (ref 11.5–15.5)
WBC: 11.7 10*3/uL — ABNORMAL HIGH (ref 4.0–10.5)
nRBC: 0 % (ref 0.0–0.2)

## 2022-07-18 LAB — LIPID PANEL
Cholesterol: 110 mg/dL (ref 0–200)
HDL: 63 mg/dL (ref >40)
LDL Cholesterol: 37 mg/dL (ref 0–99)
Total CHOL/HDL Ratio: 1.7 RATIO
Triglycerides: 50 mg/dL (ref <150)
VLDL: 10 mg/dL (ref 0–40)

## 2022-07-18 LAB — PROTIME-INR
INR: 1.9 — ABNORMAL HIGH (ref 0.8–1.2)
Prothrombin Time: 21.6 seconds — ABNORMAL HIGH (ref 11.4–15.2)

## 2022-07-18 LAB — TROPONIN I (HIGH SENSITIVITY)
Troponin I (High Sensitivity): 15 ng/L (ref <18)
Troponin I (High Sensitivity): 94 ng/L — ABNORMAL HIGH (ref <18)

## 2022-07-18 LAB — HEMOGLOBIN A1C
Hgb A1c MFr Bld: 4.6 % — ABNORMAL LOW (ref 4.8–5.6)
Mean Plasma Glucose: 85.32 mg/dL

## 2022-07-18 LAB — APTT: aPTT: >200 seconds (ref 24–36)

## 2022-07-18 LAB — LIPASE, BLOOD: Lipase: 36 U/L (ref 11–51)

## 2022-07-18 SURGERY — LEFT HEART CATH AND CORONARY ANGIOGRAPHY
Anesthesia: LOCAL

## 2022-07-18 MED ORDER — ROSUVASTATIN CALCIUM 5 MG PO TABS
5.0000 mg | ORAL_TABLET | Freq: Every day | ORAL | Status: DC
  Administered 2022-07-18 – 2022-07-22 (×5): 5 mg via ORAL
  Filled 2022-07-18 (×5): qty 1

## 2022-07-18 MED ORDER — SODIUM CHLORIDE 0.9 % IV SOLN: INTRAVENOUS | Status: DC

## 2022-07-18 MED ORDER — ASPIRIN 81 MG PO CHEW
324.0000 mg | CHEWABLE_TABLET | Freq: Once | ORAL | Status: AC
  Administered 2022-07-18: 324 mg via ORAL
  Filled 2022-07-18: qty 4

## 2022-07-18 MED ORDER — SACCHAROMYCES BOULARDII 250 MG PO CAPS
250.0000 mg | ORAL_CAPSULE | Freq: Every day | ORAL | Status: DC
  Administered 2022-07-18 – 2022-07-22 (×5): 250 mg via ORAL
  Filled 2022-07-18 (×5): qty 1

## 2022-07-18 MED ORDER — PANTOPRAZOLE SODIUM 40 MG PO TBEC
40.0000 mg | DELAYED_RELEASE_TABLET | Freq: Every day | ORAL | Status: DC
  Administered 2022-07-18 – 2022-07-21 (×4): 40 mg via ORAL
  Filled 2022-07-18 (×3): qty 1

## 2022-07-18 MED ORDER — DULOXETINE HCL 30 MG PO CPEP: 30.0000 mg | ORAL_CAPSULE | Freq: Every day | ORAL | Status: DC

## 2022-07-18 MED ORDER — ACETAMINOPHEN 325 MG PO TABS: 650.0000 mg | ORAL_TABLET | ORAL | Status: DC | PRN

## 2022-07-18 MED ORDER — SODIUM CHLORIDE 0.9 % IV SOLN: 250.0000 mL | INTRAVENOUS | Status: DC | PRN

## 2022-07-18 MED ORDER — GABAPENTIN 300 MG PO CAPS: 600.0000 mg | ORAL_CAPSULE | Freq: Three times a day (TID) | ORAL | Status: DC

## 2022-07-18 MED ORDER — SODIUM CHLORIDE 0.9 % IV SOLN
INTRAVENOUS | Status: AC | PRN
  Administered 2022-07-18: 250 mL via INTRAVENOUS

## 2022-07-18 MED ORDER — AMLODIPINE BESYLATE 5 MG PO TABS
5.0000 mg | ORAL_TABLET | Freq: Every day | ORAL | Status: DC
  Administered 2022-07-19: 5 mg via ORAL
  Filled 2022-07-18: qty 1

## 2022-07-18 MED ORDER — COLCHICINE 0.6 MG PO TABS
0.6000 mg | ORAL_TABLET | Freq: Two times a day (BID) | ORAL | Status: DC
  Administered 2022-07-18 – 2022-07-22 (×8): 0.6 mg via ORAL
  Filled 2022-07-18 (×9): qty 1

## 2022-07-18 MED ORDER — IOHEXOL 350 MG/ML SOLN
INTRAVENOUS | Status: DC | PRN
  Administered 2022-07-18: 65 mL

## 2022-07-18 MED ORDER — HEPARIN SODIUM (PORCINE) 5000 UNIT/ML IJ SOLN
4000.0000 [IU] | Freq: Once | INTRAMUSCULAR | Status: AC
  Administered 2022-07-18: 4000 [IU] via INTRAVENOUS
  Filled 2022-07-18: qty 1

## 2022-07-18 MED ORDER — MIDAZOLAM HCL 2 MG/2ML IJ SOLN
INTRAMUSCULAR | Status: AC
  Filled 2022-07-18: qty 2

## 2022-07-18 MED ORDER — HEPARIN (PORCINE) IN NACL 1000-0.9 UT/500ML-% IV SOLN
INTRAVENOUS | Status: DC | PRN
  Administered 2022-07-18 (×2): 500 mL

## 2022-07-18 MED ORDER — ANASTROZOLE 1 MG PO TABS
1.0000 mg | ORAL_TABLET | Freq: Every day | ORAL | Status: DC
  Administered 2022-07-18 – 2022-07-22 (×5): 1 mg via ORAL
  Filled 2022-07-18 (×5): qty 1

## 2022-07-18 MED ORDER — FENTANYL CITRATE (PF) 100 MCG/2ML IJ SOLN
INTRAMUSCULAR | Status: DC | PRN
  Administered 2022-07-18: 25 ug via INTRAVENOUS

## 2022-07-18 MED ORDER — VITAMIN D 25 MCG (1000 UNIT) PO TABS
1000.0000 [IU] | ORAL_TABLET | Freq: Every day | ORAL | Status: DC
  Administered 2022-07-18 – 2022-07-22 (×5): 1000 [IU] via ORAL
  Filled 2022-07-18 (×5): qty 1

## 2022-07-18 MED ORDER — LEVOTHYROXINE SODIUM 50 MCG PO TABS
50.0000 ug | ORAL_TABLET | Freq: Every morning | ORAL | Status: DC
  Administered 2022-07-19 – 2022-07-22 (×4): 50 ug via ORAL
  Filled 2022-07-18 (×4): qty 1

## 2022-07-18 MED ORDER — ONDANSETRON HCL 4 MG PO TABS: 4.0000 mg | ORAL_TABLET | Freq: Three times a day (TID) | ORAL | Status: DC | PRN

## 2022-07-18 MED ORDER — GABAPENTIN 300 MG PO CAPS
600.0000 mg | ORAL_CAPSULE | Freq: Three times a day (TID) | ORAL | Status: DC
  Administered 2022-07-18 – 2022-07-22 (×12): 600 mg via ORAL
  Filled 2022-07-18 (×8): qty 2
  Filled 2022-07-18: qty 6
  Filled 2022-07-18 (×3): qty 2

## 2022-07-18 MED ORDER — SODIUM CHLORIDE 0.9% FLUSH: 3.0000 mL | INTRAVENOUS | Status: DC | PRN

## 2022-07-18 MED ORDER — HEPARIN SODIUM (PORCINE) 1000 UNIT/ML IJ SOLN: INTRAMUSCULAR | Status: DC | PRN

## 2022-07-18 MED ORDER — SODIUM CHLORIDE 0.9 % IV BOLUS
500.0000 mL | Freq: Once | INTRAVENOUS | Status: AC
  Administered 2022-07-18: 500 mL via INTRAVENOUS

## 2022-07-18 MED ORDER — ONDANSETRON HCL 4 MG/2ML IJ SOLN
4.0000 mg | Freq: Four times a day (QID) | INTRAMUSCULAR | Status: DC | PRN
  Administered 2022-07-19 – 2022-07-20 (×2): 4 mg via INTRAVENOUS
  Filled 2022-07-18 (×2): qty 2

## 2022-07-18 MED ORDER — LIDOCAINE HCL (PF) 1 % IJ SOLN
INTRAMUSCULAR | Status: DC | PRN
  Administered 2022-07-18: 2 mL

## 2022-07-18 MED ORDER — SODIUM CHLORIDE 0.9% FLUSH
3.0000 mL | Freq: Two times a day (BID) | INTRAVENOUS | Status: DC
  Administered 2022-07-19 – 2022-07-22 (×6): 3 mL via INTRAVENOUS

## 2022-07-18 MED ORDER — CYCLOBENZAPRINE HCL 10 MG PO TABS
10.0000 mg | ORAL_TABLET | Freq: Every day | ORAL | Status: DC
  Administered 2022-07-20 – 2022-07-21 (×2): 10 mg via ORAL
  Filled 2022-07-18 (×4): qty 1

## 2022-07-18 MED ORDER — HEPARIN SODIUM (PORCINE) 1000 UNIT/ML IJ SOLN
INTRAMUSCULAR | Status: AC
  Filled 2022-07-18: qty 10

## 2022-07-18 MED ORDER — IPRATROPIUM BROMIDE 0.06 % NA SOLN
2.0000 | Freq: Two times a day (BID) | NASAL | Status: DC
  Administered 2022-07-18 – 2022-07-22 (×8): 2 via NASAL
  Filled 2022-07-18: qty 15

## 2022-07-18 MED ORDER — VERAPAMIL HCL 2.5 MG/ML IV SOLN
INTRAVENOUS | Status: AC
  Filled 2022-07-18: qty 2

## 2022-07-18 MED ORDER — MIDAZOLAM HCL 2 MG/2ML IJ SOLN
INTRAMUSCULAR | Status: DC | PRN
  Administered 2022-07-18: 1 mg via INTRAVENOUS

## 2022-07-18 MED ORDER — LIDOCAINE HCL (PF) 1 % IJ SOLN
INTRAMUSCULAR | Status: AC
  Filled 2022-07-18: qty 30

## 2022-07-18 MED ORDER — ACETAMINOPHEN 500 MG PO TABS
1000.0000 mg | ORAL_TABLET | Freq: Four times a day (QID) | ORAL | Status: DC | PRN
  Administered 2022-07-20 – 2022-07-21 (×2): 1000 mg via ORAL
  Filled 2022-07-18 (×2): qty 2

## 2022-07-18 MED ORDER — FLUTICASONE PROPIONATE 50 MCG/ACT NA SUSP
2.0000 | Freq: Every day | NASAL | Status: DC
  Administered 2022-07-19 – 2022-07-22 (×4): 2 via NASAL
  Filled 2022-07-18: qty 16

## 2022-07-18 MED ORDER — ALBUTEROL SULFATE (2.5 MG/3ML) 0.083% IN NEBU: 2.5000 mg | INHALATION_SOLUTION | Freq: Four times a day (QID) | RESPIRATORY_TRACT | Status: DC | PRN

## 2022-07-18 MED ORDER — DULOXETINE HCL 60 MG PO CPEP
60.0000 mg | ORAL_CAPSULE | Freq: Every day | ORAL | Status: DC
  Administered 2022-07-18 – 2022-07-22 (×5): 60 mg via ORAL
  Filled 2022-07-18 (×5): qty 1

## 2022-07-18 MED ORDER — VERAPAMIL HCL 2.5 MG/ML IV SOLN
INTRAVENOUS | Status: DC | PRN
  Administered 2022-07-18: 10 mL via INTRA_ARTERIAL

## 2022-07-18 MED ORDER — FENTANYL CITRATE (PF) 100 MCG/2ML IJ SOLN
INTRAMUSCULAR | Status: AC
  Filled 2022-07-18: qty 2

## 2022-07-18 SURGICAL SUPPLY — 9 items
CATH INFINITI 5FR MULTPACK ANG (CATHETERS) IMPLANT
DEVICE RAD COMP TR BAND LRG (VASCULAR PRODUCTS) IMPLANT
GLIDESHEATH SLEND SS 6F .021 (SHEATH) IMPLANT
GUIDEWIRE INQWIRE 1.5J.035X260 (WIRE) IMPLANT
INQWIRE 1.5J .035X260CM (WIRE) ×1
KIT HEART LEFT (KITS) ×1 IMPLANT
PACK CARDIAC CATHETERIZATION (CUSTOM PROCEDURE TRAY) ×1 IMPLANT
TRANSDUCER W/STOPCOCK (MISCELLANEOUS) ×1 IMPLANT
TUBING CIL FLEX 10 FLL-RA (TUBING) ×1 IMPLANT

## 2022-07-18 NOTE — ED Provider Notes (Signed)
[place] EMERGENCY DEPARTMENT AT [facility] Provider Note   CSN: [id number] Arrival date & time: 07/18/22  1414     History  Chief Complaint  Patient presents with   Dizziness    Sierra Perez is a 62 y.o. female.  62 yo F with a chief complaint of feeling fatigued.  She tells me that this really started this morning.  She had been complaining of some epigastric chest discomfort that radiates to her neck.  Seems to be worse with certain positions and with deep inspiration.  She was just seen in the emergency department and had an unremarkable workup.  She feels like the pain is somewhat better but she is just more fatigued.  Her friend is with her and tells me that she was so sweaty that she seemed like she had taken a shower when she saw her initially.  She is not sure if she is really had any difficulty breathing with this.  Did get nauseated and vomit spontaneously.  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  She did not take it this morning.  She has a history of hypertension hyperlipidemia diabetes she denies smoking.  Denies family history of MI.  Denies prior history of MI.   Dizziness      Home Medications Prior to Admission medications   Medication Sig Start Date End Date Taking? Authorizing Provider  acetaminophen (TYLENOL) 500 MG tablet Take 1,000 mg by mouth every 6 (six) hours as needed for moderate pain.    [provider]  albuterol (VENTOLIN HFA) 108 (90 Base) MCG/ACT inhaler Inhale 1 puff into the lungs every 6 (six) hours as needed for wheezing or shortness of breath. 01/18/19   [provider]  amLODipine (NORVASC) 5 MG tablet Take 5 mg by mouth daily. 06/05/20   [provider]  anastrozole (ARIMIDEX) 1 MG tablet TAKE 1 TABLET(1 MG) BY MOUTH DAILY 02/19/22   Sierra Croissant, MD  CALCIUM CITRATE PO Take 2 tablets by mouth daily.    [provider]  Cholecalciferol (VITAMIN D-3) 25 MCG (1000 UT) CAPS  Take 1,000 Units by mouth daily.    [provider]  cyclobenzaprine (FLEXERIL) 10 MG tablet Take 1 tablet (10 mg total) by mouth at bedtime. 03/02/22   Kirtland Bouchard, PA-C  DULoxetine (CYMBALTA) 30 MG capsule 30 mg daily. 08/01/20   [provider]  DULoxetine (CYMBALTA) 60 MG capsule Take 60 mg by mouth daily.    [provider]  ELIQUIS 5 MG TABS tablet TAKE 1 TABLET(5 MG) BY MOUTH TWICE DAILY 01/19/22   Sierra Croissant, MD  fluticasone (FLONASE) 50 MCG/ACT nasal spray Place 2 sprays into both nostrils daily.    [provider]  gabapentin (NEURONTIN) 600 MG tablet TAKE 1 TABLET(600 MG) BY MOUTH THREE TIMES DAILY 04/21/22   Kirtland Bouchard, PA-C  ipratropium (ATROVENT) 0.03 % nasal spray Place 2 sprays into both nostrils 2 (two) times daily. 05/21/19   [provider]  Lactobacillus Rhamnosus, GG, (CULTURELLE PO) Take 1 capsule by mouth daily.    [provider]  levothyroxine (SYNTHROID) 50 MCG tablet Take 50 mcg by mouth every morning. 05/18/19   [provider]  lidocaine (LIDODERM) 5 % Place 1 patch onto the skin daily. Remove & Discard patch within 12 hours or as directed by MD 07/16/22   Elayne Snare K, DO  magic mouthwash SOLN Swish and spit twice daily 01/23/22   Sierra Perez,  Sierra Gitelman, PA-C  NON FORMULARY Pt uses a cpap nightly    [provider]  ondansetron (ZOFRAN) 4 MG tablet Take 1 tablet (4 mg total) by mouth every 8 (eight) hours as needed for nausea or vomiting. 01/22/22   Kirtland Bouchard, PA-C  pantoprazole (PROTONIX) 40 MG tablet Take 1 tablet (40 mg total) by mouth daily. 11/09/18   Sierra Croissant, MD  rosuvastatin (CRESTOR) 5 MG tablet Take 1 tablet (5 mg total) by mouth daily. 04/16/22   Bensimhon, Bevelyn Buckles, MD  tirzepatide [facility]) 12.5 MG/0.5ML Pen Inject 12.5 mg into the skin once a week. 07/06/22   Bensimhon, Bevelyn Buckles, MD  valACYclovir (VALTREX) 500 MG tablet Take 1 tablet (500 mg total) by mouth daily.  Increase to bid x 3 days with symptoms Patient taking differently: Take 500 mg by mouth daily as needed (outbreaks). Increase to bid x 3 days with symptoms 08/14/21   Sierra Bears, MD      Allergies    Succinylcholine, Azithromycin, Keflex [cephalexin], Silver, Sulfa antibiotics, and Sulfasalazine    Review of Systems   Review of Systems  Neurological:  Positive for dizziness.    Physical Exam Updated Vital Signs BP (!) 108/92 (BP Location: Left Arm)   Pulse (!) 105   Temp 98.2 F (36.8 C) (Oral)   Resp 20   Ht 5\' 8"  (1.727 m)   Wt 77.1 kg   SpO2 99%   BMI 25.85 kg/m  Physical Exam Vitals and nursing note reviewed.  Constitutional:      General: She is not in acute distress.    Appearance: She is well-developed. She is not diaphoretic.  HENT:     Head: Normocephalic and atraumatic.  Eyes:     Pupils: Pupils are equal, round, and reactive to light.  Cardiovascular:     Rate and Rhythm: Regular rhythm. Tachycardia present.     Heart sounds: No murmur heard.    No friction rub. No gallop.  Pulmonary:     Effort: Pulmonary effort is normal.     Breath sounds: No wheezing or rales.  Abdominal:     General: There is no distension.     Palpations: Abdomen is soft.     Tenderness: There is no abdominal tenderness.  Musculoskeletal:        General: No tenderness.     Cervical back: Normal range of motion and neck supple.  Skin:    General: Skin is warm and dry.  Neurological:     Mental Status: She is alert and oriented to person, place, and time.  Psychiatric:        Behavior: Behavior normal.     ED Results / Procedures / Treatments   Labs (all labs ordered are listed, but only abnormal results are displayed) Labs Reviewed  COMPREHENSIVE METABOLIC PANEL - Abnormal; Notable for the following components:      Result Value   CO2 20 (*)    Glucose, Bld 154 (*)    BUN 28 (*)    Creatinine, Ser 1.25 (*)    Total Protein 6.4 (*)    AST 79 (*)    ALT 76 (*)     Total Bilirubin 2.1 (*)    GFR, Estimated 49 (*)    All other components within normal limits  CBC WITH DIFFERENTIAL/PLATELET - Abnormal; Notable for the following components:   WBC 11.7 (*)    Neutro Abs 9.8 (*)    All other components within normal limits  HEMOGLOBIN A1C - Abnormal; Notable for the following components:   Hgb A1c MFr Bld 4.6 (*)    All other components within normal limits  LIPASE, BLOOD  LIPID PANEL  PROTIME-INR  APTT  I-STAT CG4 LACTIC ACID, ED  TROPONIN I (HIGH SENSITIVITY)  TROPONIN I (HIGH SENSITIVITY)    EKG EKG Interpretation  Date/Time:  Saturday July 18 2022 14:46:13 EDT Ventricular Rate:  105 PR Interval:  127 QRS Duration: 89 QT Interval:  324 QTC Calculation: 429 R Axis:   43 Text Interpretation: Sinus tachycardia Atrial premature complex subtle st changes inferiorly and laterally No significant change since last tracing Confirmed by Melene Plan [phone]) on 07/18/2022 3:11:58 PM  Radiology DG Chest Portable 1 View  Result Date: 07/18/2022 CLINICAL DATA:  weakness EXAM: PORTABLE CHEST 1 VIEW COMPARISON:  July 16, 2022 FINDINGS: Evaluation is limited by rotation. The cardiomediastinal silhouette is unchanged in contour. No pleural effusion. No pneumothorax. No acute pleuroparenchymal abnormality. IMPRESSION: No acute cardiopulmonary abnormality. Electronically Signed   By: Meda Klinefelter M.D.   On: 07/18/2022 15:43   CT Angio Chest PE W and/or Wo Contrast  Result Date: 07/16/2022 CLINICAL DATA:  Chest pain. EXAM: CT ANGIOGRAPHY CHEST WITH CONTRAST TECHNIQUE: Multidetector CT imaging of the chest was performed using the standard protocol during bolus administration of intravenous contrast. Multiplanar CT image reconstructions and MIPs were obtained to evaluate the vascular anatomy. RADIATION DOSE REDUCTION: This exam was performed according to the departmental dose-optimization program which includes automated exposure control, adjustment of the mA  and/or kV according to patient size and/or use of iterative reconstruction technique. CONTRAST:  75mL OMNIPAQUE IOHEXOL 350 MG/ML SOLN COMPARISON:  March 04, 2021 FINDINGS: Cardiovascular: The thoracic aorta is normal in appearance. Satisfactory opacification of the pulmonary arteries to the segmental level. No evidence of pulmonary embolism. Normal heart size. No pericardial effusion. Mediastinum/Nodes: No enlarged mediastinal, hilar, or axillary lymph nodes. Thyroid gland, trachea, and esophagus demonstrate no significant findings. Lungs/Pleura: A stable 3 mm noncalcified lung nodule is seen within the anterolateral aspect of the right lower lobe (axial CT image 79, CT series 6). There is no evidence of an acute infiltrate, pleural effusion or pneumothorax. Upper Abdomen: Surgical sutures are seen within the gastric region. Musculoskeletal: Bilateral breast implants are seen. No acute osseous abnormalities are identified. Review of the MIP images confirms the above findings. IMPRESSION: 1. No evidence of pulmonary embolism or acute cardiopulmonary disease. 2. Stable 3 mm noncalcified right lower lobe lung nodule. No follow-up needed if patient is low-risk.This recommendation follows the consensus statement: Guidelines for Management of Incidental Pulmonary Nodules Detected on CT Images: From the Fleischner Society 2017; Radiology 2017; 284:228-243. Electronically Signed   By: Aram Candela M.D.   On: 07/16/2022 22:31   DG Chest 2 View  Result Date: 07/16/2022 CLINICAL DATA:  Chest pain. EXAM: CHEST - 2 VIEW COMPARISON:  Cardiac CT 03/04/2021 FINDINGS: The cardiomediastinal contours are normal. The lungs are clear. Pulmonary vasculature is normal. No consolidation, pleural effusion, or pneumothorax. No acute osseous abnormalities are seen. Added density over the right lung bases related to overlying breast implant. IMPRESSION: No acute chest findings. Electronically Signed   By: Narda Rutherford M.D.    On: 07/16/2022 19:52    Procedures .Critical Care  Performed by: Melene Plan, DO Authorized by: Melene Plan, DO   Critical care provider statement:    Critical care time (minutes):  80   Critical care time was exclusive of:  Separately billable procedures and  treating other patients   Critical care was time spent personally by me on the following activities:  Development of treatment plan with patient or surrogate, discussions with consultants, evaluation of patient's response to treatment, examination of patient, ordering and review of laboratory studies, ordering and review of radiographic studies, ordering and performing treatments and interventions, pulse oximetry, re-evaluation of patient's condition and review of old charts   Care discussed with: admitting provider       Medications Ordered in ED Medications  sodium chloride 0.9 % bolus 500 mL (500 mLs Intravenous New Bag/Given 07/18/22 1521)  aspirin chewable tablet 324 mg (324 mg Oral Given 07/18/22 1531)  heparin injection 4,000 Units (4,000 Units Intravenous Given 07/18/22 1532)    ED Course/ Medical Decision Making/ A&P                             Medical Decision Making Amount and/or Complexity of Data Reviewed Labs: ordered.  Risk OTC drugs. Prescription drug management.   62 yo F with a chief complaints of fatigue.  This has been going on since she woke up this morning.  The patient unfortunately has been suffering from chest discomfort over the past 72 hours or so.  She was seen in the emergency department for this and had 2 negative troponins and a negative CT angiogram of the chest.  She feeling her chest pain actually is a little bit better but she was much more fatigued today and unable to get up and get out of bed.  She at one point was very diaphoretic and had an episode of vomiting.  Her initial EKG showed a slightly different morphology in the inferior leads.  Her repeat showed a bit more pronounced EKG changes.   Will activate as a code STEMI.  Patient was seen by Dr. [name], taken to Cath Lab.  The patients results and plan were reviewed and discussed.   Any x-rays performed were independently reviewed by myself.   Differential diagnosis were considered with the presenting HPI.  Medications  sodium chloride 0.9 % bolus 500 mL (500 mLs Intravenous New Bag/Given 07/18/22 1521)  aspirin chewable tablet 324 mg (324 mg Oral Given 07/18/22 1531)  heparin injection 4,000 Units (4,000 Units Intravenous Given 07/18/22 1532)    Vitals:   07/18/22 1425 07/18/22 1426  BP: (!) 108/92   Pulse: (!) 105   Resp: 20   Temp: 98.2 F (36.8 C)   TempSrc: Oral   SpO2: 99%   Weight:  77.1 kg  Height:  5\' 8"  (1.727 m)    Final diagnoses:  ST elevation myocardial infarction involving right coronary artery [facility])    Admission/ observation were discussed with the admitting physician, patient and/or family and they are comfortable with the plan.          Final Clinical Impression(s) / ED Diagnoses Final diagnoses:  ST elevation myocardial infarction involving right coronary artery [facility])    Rx / DC Orders ED Discharge Orders     None         Melene Plan, DO 07/18/22 1630

## 2022-07-18 NOTE — ED Provider Triage Note (Signed)
Emergency Medicine Provider Triage Evaluation Note  Sierra Perez , a 62 y.o. female  was evaluated in triage.  Pt complains of of generalized weakness and dizziness. Patient evaluated in the ED recently with CP with reassuring workup. She has been complaint with Eliquis. No bleeding symptoms. This AM, patient had profound lightheadedness and nausea. Also notes feeling "clammy." No CP, tightness, heaviness.   Review of Systems  Positive: Weakness and nausea Negative: CP or abdominal pain  Physical Exam  BP (!) 108/92 (BP Location: Left Arm)   Pulse (!) 105   Temp 98.2 F (36.8 C) (Oral)   Resp 20   Ht 5\' 8"  (1.727 m)   Wt 77.1 kg   SpO2 99%   BMI 25.85 kg/m  Gen:   Awake, no distress   Resp:  Normal effort  MSK:   Moves extremities without difficulty  Other:  Normal heart sounds.   Medical Decision Making  Medically screening exam initiated at 2:50 PM.  Appropriate orders placed.  Sierra Perez was informed that the remainder of the evaluation will be completed by another provider, this initial triage assessment does not replace that evaluation, and the importance of remaining in the ED until their evaluation is complete.  EKG with ST changes inferiorly. No active CP. Does not meet STEMI criteria at this time.    Sierra Plan, MD 07/18/22 1453

## 2022-07-18 NOTE — ED Triage Notes (Signed)
Patient arrives from home by EMS with c/o dizziness.   Patient reports was seen on Thursday for chest pain, when breathing in, and leaning forward or laying back.   Patient reports today had generalized weakness with slight headache, accompanied by nausea and vomiting.   EMS reports vomiting en route, patient received 4 mg Zofran, and 500 ml of NS  PTA.  CHG 243

## 2022-07-18 NOTE — H&P (Signed)
Cardiology Admission History and Physical   Patient ID: Sierra Perez MRN: [id number]; DOB: March 06, 1960   Admission date: 07/18/2022  PCP:  Georgann Housekeeper, MD   [facility] Providers Cardiologist:  None        Chief Complaint:  [place] & 3 2 days of Chest Pain  Patient Profile:   [name] is a 62 y.o. female with PMH notable for h/o R Br Ca, HTN, HLD & recurrent DVT- onchronic DOAC who is being seen 07/18/2022 for the evaluation of Possible Inferior STEMI -vs. Pericarditis.Sierra Perez  History of Present Illness:   [name] was recently seein @ [facility] on 6/13 for CP - troponin Negative.  Pain was worse with inspiration & certain movements. NO real associated dyspnea - just feels bad in general.  Was d/c from ER with no notable EKG changes & negative Troponin + Chest CTA on 6/13, but CP persisted all day on 6/14. Slept in recliner overnight - was worst lying down.  Pain not constant.  This AM- 6/15 - she woke up with extreme fatigue & nause - was diaphoretic - ("very sweaty, grey& clammy).  What she describes now is more of epigastric discomfort radiating upwards.  Today she really describe more feeling fatigued and nauseated.  It was the extent of the diaphoresis think because to be overly concerned. Initial EKGs in the ER were relatively normal with unusual ST segments in the inferior leads, no elevations, however the third EKG did show subtle ST elevations in the inferior leads and so code STEMI was called.  Last Eliquis was last PM  Delay to Dx - initial EKG not really c/w STE, but repeat #2 more prominent horizontal STE in Inferior Leads, so STEMI called.  Clinical presentation not consistent with MI, therefore ER physician did not call code STEMI until more diagnostic EKG from 3:20 PM.  Clinically the patient does not appear to be having an inferior STEMI and EKG is not necessarily definitive for STEMI.  However because of her ongoing issues with chest discomfort  and her just not feeling well, I determined that we would proceed with cardiac catheterization for definitive evaluation.  The other differential diagnosis would be pericarditis.  She has been brought to the cardiac catheterization lab as a possible STEMI after long discussion with the patient and her friend-discussing the risk, benefits alternatives and indications.  She is currently pain-free and resting comfortably.   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, her-2 positive, ER/PR negative   Clotting disorder (HCC)    testing was negative, h/o DVT while on treatment   Colon polyps    Complex regional pain syndrome i of right lower limb    RDS   Complication of anesthesia    Cystitis    Diabetes mellitus without complication (HCC)    DVT (deep venous thrombosis) (HCC)    started in 2015, multiple   Family history of breast cancer    Family history of colon cancer    Fibroid    Fibromyalgia    GERD (gastroesophageal reflux disease)    Heart murmur    HLD (hyperlipidemia)    HPV in female    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   Hypertension    Hypothyroidism    IBS (irritable bowel syndrome)  Mitral valve prolapse    Peptic ulcer    Personal history of malignant neoplasm of breast    Sleep apnea treated with continuous positive airway pressure (CPAP)     Past Surgical History:  Procedure Laterality Date   ABDOMINAL HYSTERECTOMY  2010   fibroids, h/o abnormal pap smears   AXILLARY SENTINEL NODE BIOPSY Right 07/08/2020   Procedure: RIGHT AXILLARY SENTINEL NODE BIOPSY;  Surgeon: Emelia Loron, MD;  Location: [facility] OR;  Service: General;  Laterality: Right;   BREAST RECONSTRUCTION WITH PLACEMENT OF TISSUE EXPANDER AND FLEX HD (ACELLULAR HYDRATED DERMIS) Bilateral 07/08/2020   Procedure: BILATERAL BREAST RECONSTRUCTION WITH  FLEX HD (ACELLULAR HYDRATED  DERMIS),DIRECT IMPLANT RECONSTRUCTION;  Surgeon: Allena Napoleon, MD;  Location: [facility] OR;  Service: Plastics;  Laterality: Bilateral;   BREAST SURGERY Right 2015   right lumpectomy with reduction and lift   COLONOSCOPY     DENTAL SURGERY     ESOPHAGOGASTRODUODENOSCOPY     LAPAROSCOPIC GASTRIC SLEEVE RESECTION  2014   LAPAROSCOPIC OOPHERECTOMY Bilateral 2016   REPLACEMENT TOTAL KNEE Left 2018   also had 3 prior arthroscopies   TOE SURGERY Right    dislocated toe   TOTAL HIP ARTHROPLASTY Right 11/19/2020   Procedure: RIGHT TOTAL HIP ARTHROPLASTY ANTERIOR APPROACH;  Surgeon: Kathryne Hitch, MD;  Location: [facility] OR;  Service: Orthopedics;  Laterality: Right;   TOTAL KNEE ARTHROPLASTY Right 01/09/2022   Procedure: RIGHT TOTAL KNEE ARTHROPLASTY;  Surgeon: Kathryne Hitch, MD;  Location: [facility];  Service: Orthopedics;  Laterality: Right;   TOTAL MASTECTOMY Bilateral 07/08/2020   Procedure: BILATERAL TOTAL MASTECTOMY;  Surgeon: Emelia Loron, MD;  Location: [facility] OR;  Service: General;  Laterality: Bilateral;   VENA CAVA FILTER PLACEMENT  2017     Medications Prior to Admission: Prior to Admission medications   Medication Sig Start Date End Date Taking? Authorizing Provider  acetaminophen (TYLENOL) 500 MG tablet Take 1,000 mg by mouth every 6 (six) hours as needed for moderate pain.    [provider]  albuterol (VENTOLIN HFA) 108 (90 Base) MCG/ACT inhaler Inhale 1 puff into the lungs every 6 (six) hours as needed for wheezing or shortness of breath. 01/18/19   [provider]  amLODipine (NORVASC) 5 MG tablet Take 5 mg by mouth daily. 06/05/20   [provider]  anastrozole (ARIMIDEX) 1 MG tablet TAKE 1 TABLET(1 MG) BY MOUTH DAILY 02/19/22   [name], MD  CALCIUM CITRATE PO Take 2 tablets by mouth daily.    [provider]  Cholecalciferol (VITAMIN D-3) 25 MCG (1000 UT) CAPS Take 1,000 Units by mouth daily.    [provider]   cyclobenzaprine (FLEXERIL) 10 MG tablet Take 1 tablet (10 mg total) by mouth at bedtime. 03/02/22   Kirtland Bouchard, PA-C  DULoxetine (CYMBALTA) 30 MG capsule 30 mg daily. 08/01/20   [provider]  DULoxetine (CYMBALTA) 60 MG capsule Take 60 mg by mouth daily.    [provider]  ELIQUIS 5 MG TABS tablet TAKE 1 TABLET(5 MG) BY MOUTH TWICE DAILY 01/19/22   [name], MD  fluticasone (FLONASE) 50 MCG/ACT nasal spray Place 2 sprays into both nostrils daily.    [provider]  gabapentin (NEURONTIN) 600 MG tablet TAKE 1 TABLET(600 MG) BY MOUTH THREE TIMES DAILY 04/21/22   Kirtland Bouchard, PA-C  ipratropium (ATROVENT) 0.03 % nasal spray Place 2 sprays into both nostrils 2 (two) times daily. 05/21/19   [provider]  Lactobacillus Rhamnosus,  GG, (CULTURELLE PO) Take 1 capsule by mouth daily.    [provider]  levothyroxine (SYNTHROID) 50 MCG tablet Take 50 mcg by mouth every morning. 05/18/19   [provider]  lidocaine (LIDODERM) 5 % Place 1 patch onto the skin daily. Remove & Discard patch within 12 hours or as directed by MD 07/16/22   Elayne Snare K, DO  magic mouthwash SOLN Swish and spit twice daily 01/23/22   Kirtland Bouchard, PA-C  NON FORMULARY Pt uses a cpap nightly    [provider]  ondansetron (ZOFRAN) 4 MG tablet Take 1 tablet (4 mg total) by mouth every 8 (eight) hours as needed for nausea or vomiting. 01/22/22   Kirtland Bouchard, PA-C  pantoprazole (PROTONIX) 40 MG tablet Take 1 tablet (40 mg total) by mouth daily. 11/09/18   [name], MD  rosuvastatin (CRESTOR) 5 MG tablet Take 1 tablet (5 mg total) by mouth daily. 04/16/22   Bensimhon, Bevelyn Buckles, MD  tirzepatide [facility]) 12.5 MG/0.5ML Pen Inject 12.5 mg into the skin once a week. 07/06/22   Bensimhon, Bevelyn Buckles, MD  valACYclovir (VALTREX) 500 MG tablet Take 1 tablet (500 mg total) by mouth daily. Increase to bid x 3 days with symptoms Patient taking  differently: Take 500 mg by mouth daily as needed (outbreaks). Increase to bid x 3 days with symptoms 08/14/21   [name], MD     Allergies:    Allergies  Allergen Reactions   Succinylcholine Other (See Comments)    Muscle aching  18 years ago from 07/02/20   Azithromycin     **DOES NOT WORK** **DOES NOT WORK**    Keflex [Cephalexin] Diarrhea and Nausea And Vomiting   Silver Rash   Sulfa Antibiotics Rash    And swelling generalized not in throat   Sulfasalazine Rash    Social History:   Social History   Socioeconomic History   Marital status: Single    Spouse name: Not on file   Number of children: 1   Years of education: Not on file   Highest education level: Not on file  Occupational History   Occupation: [employer]  Tobacco Use   Smoking status: Never   Smokeless tobacco: Never  Vaping Use   Vaping Use: Never used  Substance and Sexual Activity   Alcohol use: Yes    Comment: rare   Drug use: Not Currently    Types: Marijuana   Sexual activity: Not Currently    Birth control/protection: Surgical    Comment: Hysterectomy  Other Topics Concern   Not on file  Social History Narrative   Not on file   Social Determinants of Health   Financial Resource Strain: Not on file  Food Insecurity: No Food Insecurity (01/09/2022)   Hunger Vital Sign    Worried About Running Out of Food in the Last Year: Never true    Ran Out of Food in the Last Year: Never true  Transportation Needs: No Transportation Needs (01/09/2022)   PRAPARE - Administrator, Civil Service (Medical): No    Lack of Transportation (Non-Medical): No  Physical Activity: Not on file  Stress: Not on file  Social Connections: Not on file  Intimate Partner Violence: Not At Risk (01/09/2022)   Humiliation, Afraid, Rape, and Kick questionnaire    Fear of Current or Ex-Partner: No    Emotionally Abused: No    Physically Abused: No    Sexually Abused: No    Family  History: Reviewed The  patient's family history includes Bladder Cancer in her maternal grandmother; Breast cancer in her paternal grandmother; Colon cancer (age of onset: 63) in her maternal grandfather; Colon polyps in her brother and mother; Dementia in her father; Diabetes in her father and maternal grandmother; Factor VIII deficiency in her father; Hypertension in her mother; Kidney disease in her father and mother; Lung cancer in her maternal grandfather; Prostate cancer in an other family member; Skin cancer in her mother; Stroke in her maternal grandmother; Thyroid cancer in her maternal grandfather; Valvular heart disease in her mother.    ROS:  Please see the history of present illness.  => Pertinent positives, nausea, fatigue, generalized weakness. => Within the last month she had a viral URI leading to sinusitis for which she was treated with antibiotic.  Other than that has been relatively healthy.  All other ROS reviewed and negative.     Physical Exam/Data:   Vitals:   07/18/22 1710 07/18/22 1715 07/18/22 1720 07/18/22 1725  BP: 126/89 126/89    Pulse: 95 94 90 89  Resp: (!) 29 (!) 22 20 17   Temp:      TempSrc:      SpO2: 100% 96%    Weight:      Height:       No intake or output data in the 24 hours ending 07/18/22 1749    07/18/2022    2:26 PM 07/16/2022    7:13 PM 05/25/2022    9:06 AM  Last 3 Weights  Weight (lbs) 170 lb 170 lb 175 lb 8 oz  Weight (kg) 77.111 kg 77.111 kg 79.606 kg     Body mass index is 25.85 kg/m.  General:  Well nourished, well developed. really, in no acute distress HEENT: normal Neck: no carotid bruit or JVD Vascular: No carotid bruits; Distal pulses 2+ bilaterally   Cardiac:  normal S1, S2; RRR; no murmur, rubs or gallops. Lungs:  clear to auscultation bilaterally, no wheezing, rhonchi or rales  Abd: soft, nontender, no hepatomegaly  Ext: no clubbing cyanosis or edema Musculoskeletal:  No deformities, BUE and BLE strength normal and equal Skin: warm and dry   Neuro:  CNs 2-12 intact, no focal abnormalities noted Psych:  Normal affect    EKG:  The ECG that was done @ 1521 was personally reviewed and demonstrates sinus tachycardia with rate of 104 bpm.  Cannot exclude inferior ST elevations in leads II, III and aVF most prominent in 3.  Similar subtle findings seen on previous EKGs but the elevation in lead III is more pronounced => cannot exclude inferior injury pattern.  Relevant CV Studies: Coronary CTA 03/04/2021: Coronary Calcium Score 26.  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  Otherwise no notable disease. Echo 03/07/2021: EF 55 to 60%.  No RWMA.  Normal diastolic function.  Normal RV.  Normal valves.  = Normal biventricular function without evidence of hemodynamically significant heart valvular heart disease.  Laboratory Data:  High Sensitivity Troponin:   Recent Labs  Lab 07/16/22 1921 07/16/22 2155 07/18/22 1443  TROPONINIHS 3 3 15       Chemistry Recent Labs  Lab 07/16/22 1921 07/18/22 1443  NA 141 135  K 3.5 5.0  CL 107 105  CO2 24 20*  GLUCOSE 80 154*  BUN 24* 28*  CREATININE 0.88 1.25*  CALCIUM 9.5 9.4  GFRNONAA >60 49*  ANIONGAP 10 10    Recent Labs  Lab 07/18/22 1443  PROT  6.4*  ALBUMIN 3.8  AST 79*  ALT 76*  ALKPHOS 65  BILITOT 2.1*   Lipids  Recent Labs  Lab 07/18/22 1527  CHOL 110  TRIG 50  HDL 63  LDLCALC 37  CHOLHDL 1.7   Hematology Recent Labs  Lab 07/16/22 1921 07/18/22 1443  WBC 7.7 11.7*  RBC 3.72* 4.17  HGB 11.2* 12.7  HCT 33.2* 38.2  MCV 89.2 91.6  MCH 30.1 30.5  MCHC 33.7 33.2  RDW 14.3 14.4  PLT 227 203   Thyroid No results for input(s): "TSH", "FREET4" in the last 168 hours. BNPNo results for input(s): "BNP", "PROBNP" in the last 168 hours.  DDimer No results for input(s): "DDIMER" in the last 168 hours.   Radiology/Studies:  DG Chest Portable 1 View  Result Date: 07/18/2022 CLINICAL DATA:  weakness EXAM: PORTABLE CHEST 1 VIEW COMPARISON:  July 16, 2022 FINDINGS:  Evaluation is limited by rotation. The cardiomediastinal silhouette is unchanged in contour. No pleural effusion. No pneumothorax. No acute pleuroparenchymal abnormality. IMPRESSION: No acute cardiopulmonary abnormality. Electronically Signed   By: Meda Klinefelter M.D.   On: 07/18/2022 15:43     Assessment and Plan:   Abnormal EKG with inferior ST elevations, differential diagnosis is STEMI versus pericarditis. Due to the equivocal nature and subtle EKG changes with ongoing symptoms, we are proceeding to cardiac catheterization lab to exclude occluded inferior wall coronary artery.  If there is evidence of MI, will proceed with PCI, If cardiac attrition is not revealing, will proceed with 2D echocardiogram evaluation and likely consider treating empirically for potential pericarditis. Elevated LFTs of the only real abnormal lab value.  This could be a marker of myocardial ischemia.  Otherwise may need to consider right carotid ultrasound.  Will otherwise continue pertinent home medications, will hold Eliquis pending decision on use of ibuprofen plus colchicine for progress. I have written to start holding tonight but will need to consider the use of ibuprofen pending echocardiogram results.    Risk Assessment/Risk Scores:   Concern for INFERIOR STEMI TIMI Risk Score for ST  Elevation MI:   The patient's TIMI risk score is 3, which indicates a 4.4% risk of all cause mortality at 30 days.{  Severity of Illness: The appropriate patient status for this patient is INPATIENT. Inpatient status is judged to be reasonable and necessary in order to provide the required intensity of service to ensure the patient's safety. The patient's presenting symptoms, physical exam findings, and initial radiographic and laboratory data in the context of their chronic comorbidities is felt to place them at high risk for further clinical deterioration. Furthermore, it is not anticipated that the patient will be  medically stable for discharge from the hospital within 2 midnights of admission.   * I certify that at the point of admission it is my clinical judgment that the patient will require inpatient hospital care spanning beyond 2 midnights from the point of admission due to high intensity of service, high risk for further deterioration and high frequency of surveillance required.*   For questions or updates, please contact [facility] Please consult [email or web address] for contact info under     Signed, Bryan Lemma, MD  07/18/2022 5:49 PM

## 2022-07-18 NOTE — ED Notes (Signed)
Cardiologist at bedside speaking with patient.  Nurse attempting to get 2nd IV.

## 2022-07-18 NOTE — ED Notes (Signed)
Patient transported to Cath lab 

## 2022-07-18 NOTE — Plan of Care (Signed)

## 2022-07-19 ENCOUNTER — Inpatient Hospital Stay (HOSPITAL_COMMUNITY): Payer: 59

## 2022-07-19 DIAGNOSIS — I3139 Other pericardial effusion (noninflammatory): Secondary | ICD-10-CM

## 2022-07-19 DIAGNOSIS — I308 Other forms of acute pericarditis: Secondary | ICD-10-CM | POA: Diagnosis not present

## 2022-07-19 DIAGNOSIS — R079 Chest pain, unspecified: Secondary | ICD-10-CM | POA: Diagnosis not present

## 2022-07-19 LAB — ECHOCARDIOGRAM COMPLETE
Height: 68 in
S' Lateral: 2.4 cm
Weight: 2720 oz

## 2022-07-19 LAB — COMPREHENSIVE METABOLIC PANEL
ALT: 56 U/L — ABNORMAL HIGH (ref 0–44)
AST: 46 U/L — ABNORMAL HIGH (ref 15–41)
Albumin: 3.5 g/dL (ref 3.5–5.0)
Alkaline Phosphatase: 53 U/L (ref 38–126)
Anion gap: 12 (ref 5–15)
BUN: 31 mg/dL — ABNORMAL HIGH (ref 8–23)
CO2: 16 mmol/L — ABNORMAL LOW (ref 22–32)
Calcium: 9.1 mg/dL (ref 8.9–10.3)
Chloride: 105 mmol/L (ref 98–111)
Creatinine, Ser: 1.22 mg/dL — ABNORMAL HIGH (ref 0.44–1.00)
GFR, Estimated: 50 mL/min — ABNORMAL LOW (ref >60)
Glucose, Bld: 142 mg/dL — ABNORMAL HIGH (ref 70–99)
Potassium: 4.6 mmol/L (ref 3.5–5.1)
Sodium: 133 mmol/L — ABNORMAL LOW (ref 135–145)
Total Bilirubin: 1 mg/dL (ref 0.3–1.2)
Total Protein: 5.9 g/dL — ABNORMAL LOW (ref 6.5–8.1)

## 2022-07-19 LAB — CBC
HCT: 35.5 % — ABNORMAL LOW (ref 36.0–46.0)
Hemoglobin: 12.1 g/dL (ref 12.0–15.0)
MCH: 31.1 pg (ref 26.0–34.0)
MCHC: 34.1 g/dL (ref 30.0–36.0)
MCV: 91.3 fL (ref 80.0–100.0)
Platelets: 255 10*3/uL (ref 150–400)
RBC: 3.89 MIL/uL (ref 3.87–5.11)
RDW: 14.5 % (ref 11.5–15.5)
WBC: 13 10*3/uL — ABNORMAL HIGH (ref 4.0–10.5)
nRBC: 0 % (ref 0.0–0.2)

## 2022-07-19 LAB — C-REACTIVE PROTEIN: CRP: 2.7 mg/dL — ABNORMAL HIGH (ref <1.0)

## 2022-07-19 LAB — SEDIMENTATION RATE: Sed Rate: 6 mm/hr (ref 0–22)

## 2022-07-19 MED ORDER — IBUPROFEN 400 MG PO TABS
400.0000 mg | ORAL_TABLET | Freq: Three times a day (TID) | ORAL | Status: DC
  Administered 2022-07-19 – 2022-07-22 (×11): 400 mg via ORAL
  Filled 2022-07-19: qty 1
  Filled 2022-07-19 (×3): qty 2
  Filled 2022-07-19 (×4): qty 1
  Filled 2022-07-19: qty 2
  Filled 2022-07-19 (×3): qty 1

## 2022-07-19 MED ORDER — SODIUM CHLORIDE 0.9 % IV SOLN: INTRAVENOUS | Status: DC

## 2022-07-19 MED ORDER — APIXABAN 5 MG PO TABS
5.0000 mg | ORAL_TABLET | Freq: Two times a day (BID) | ORAL | Status: DC
  Administered 2022-07-19: 5 mg via ORAL
  Filled 2022-07-19: qty 1

## 2022-07-19 MED ORDER — ASPIRIN 81 MG PO TBEC
81.0000 mg | DELAYED_RELEASE_TABLET | Freq: Every day | ORAL | Status: DC
  Administered 2022-07-19 – 2022-07-22 (×4): 81 mg via ORAL
  Filled 2022-07-19 (×4): qty 1

## 2022-07-19 MED ORDER — ONDANSETRON HCL 4 MG/2ML IJ SOLN
4.0000 mg | Freq: Once | INTRAMUSCULAR | Status: AC
  Administered 2022-07-19: 4 mg via INTRAVENOUS
  Filled 2022-07-19: qty 2

## 2022-07-19 NOTE — Progress Notes (Signed)
Pt left fingers warm to touch, still reporting numbness, full range of motion intact.

## 2022-07-19 NOTE — Progress Notes (Signed)
Reviewed patients TTE Large pericardial effusion with pre tamponade ? Proteinaceous  Material in fluid not simple She remains tachy 120's BP fine She has no dyspnea or pain just fatigue  Discussed need for Rx/Diagnostic pericardiocentesis Risks including Emergent need for OR and open pericardiectomy. By TTE the fluid should be accessible percutaneously.   Concern that she has had recurrent breast cancer in past. Originally Rx 10 years ago with first DVT likely related to cancer. Recurrence 2 years ago on Arimedex 2nd DVT was after TKR.   She indicates having negative hypercoagulable but failed xarelto and lovenox with recurrent DVT Has not had any since being on eliquis Got one dose this am will hold tonight and in am   Discussed with Dr [name] who did her heart cath yesterday and is in lab tomorrow Orders written and on board for am  Charlton Haws MD [facility] [place]

## 2022-07-19 NOTE — Progress Notes (Signed)
Patient's heart rate noted to increase to 140s when out of bed to bathroom.  Patient endorses shortness of breath with exertion.  Oxygen saturation 97-99% on room air.  Heart rate returns to 90s low 100s at rest with resolution of dyspnea as well.  Lungs clear to auscultation. Will continue to monitor closely.

## 2022-07-19 NOTE — Progress Notes (Signed)
   07/19/22 0322  Assess: MEWS Score  Temp 99.2 F (37.3 C)  BP 106/79  MAP (mmHg) 89  Pulse Rate (!) 117  ECG Heart Rate (!) 117  Resp 18  Level of Consciousness Alert  SpO2 94 %  O2 Device Room Air  Assess: MEWS Score  MEWS Temp 0  MEWS Systolic 0  MEWS Pulse 2  MEWS RR 0  MEWS LOC 0  MEWS Score 2  MEWS Score Color Yellow  Assess: if the MEWS score is Yellow or Red  Were vital signs taken at a resting state? Yes  Focused Assessment No change from prior assessment  Does the patient meet 2 or more of the SIRS criteria? No  MEWS guidelines implemented  Yes, yellow  Treat  MEWS Interventions Considered administering scheduled or prn medications/treatments as ordered  Take Vital Signs  Increase Vital Sign Frequency  Yellow: Q2hr x1, continue Q4hrs until patient remains green for 12hrs  Escalate  MEWS: Escalate Yellow: Discuss with charge nurse and consider notifying provider and/or RRT  Notify: Charge Nurse/RN  Name of Charge Nurse/RN Notified Lawyer, RN  Assess: SIRS CRITERIA  SIRS Temperature  0  SIRS Pulse 1  SIRS Respirations  0  SIRS WBC 0  SIRS Score Sum  1

## 2022-07-19 NOTE — Progress Notes (Signed)
Pt reported left hand numbness since procedure and left fingers cool to touch.  Reverse Barbeau, radial/ulnar pulses and cap refill WNL.  Dr. Orson Aloe notified.  Will update if any changes noted.

## 2022-07-19 NOTE — Progress Notes (Addendum)
Rounding Note    Patient Name: Sierra Perez Date of Encounter: 07/19/2022  [facility] Cardiologist: None   Subjective   Patient reports that on Thursday 6/13, she started to have left sided chest pain when breathing deeply, leaning forward, or laying on her back. She went to the ED and was given lidocaine patches. She did have some improvement with the lidocaine patches.  She was very tired for the next few days and felt like she was having mild shortness of breath on minimal exertion. She vomited once, and decided to come to the ED.   Inpatient Medications    Scheduled Meds:  amLODipine  5 mg Oral Daily   anastrozole  1 mg Oral Daily   cholecalciferol  1,000 Units Oral Daily   colchicine  0.6 mg Oral BID   cyclobenzaprine  10 mg Oral QHS   DULoxetine  60 mg Oral Daily   fluticasone  2 spray Each Nare Daily   gabapentin  600 mg Oral TID   ipratropium  2 spray Each Nare BID   levothyroxine  50 mcg Oral q morning   pantoprazole  40 mg Oral Daily   rosuvastatin  5 mg Oral Daily   saccharomyces boulardii  250 mg Oral Daily   sodium chloride flush  3 mL Intravenous Q12H   Continuous Infusions:  sodium chloride     PRN Meds: sodium chloride, acetaminophen, acetaminophen, albuterol, ondansetron (ZOFRAN) IV, ondansetron, sodium chloride flush   Vital Signs    Vitals:   07/18/22 2115 07/18/22 2348 07/19/22 0322 07/19/22 0453  BP: 115/86 102/81 106/79 118/87  Pulse: 92 (!) 107 (!) 117 (!) 119  Resp:  20 18 20   Temp:  99.3 F (37.4 C) 99.2 F (37.3 C) 99 F (37.2 C)  TempSrc:  Oral Oral Oral  SpO2: 100% 95% 94%   Weight:      Height:        Intake/Output Summary (Last 24 hours) at 07/19/2022 0750 Last data filed at 07/19/2022 0100 Gross per 24 hour  Intake 1251.59 ml  Output --  Net 1251.59 ml      07/18/2022    2:26 PM 07/16/2022    7:13 PM 05/25/2022    9:06 AM  Last 3 Weights  Weight (lbs) 170 lb 170 lb 175 lb 8 oz  Weight (kg) 77.111 kg 77.111 kg  79.606 kg      Telemetry    Sinus tachycardia, HR in the 120s - Personally Reviewed  ECG    Sinus tachycardia, HR 104 BPM. Diffuse mild ST elevation  - Personally Reviewed  Physical Exam   GEN: No acute distress.  Laying in the bed with head elevated  Neck: No JVD Cardiac: Regular rhythm, tachycardic. No murmurs  Respiratory: Clear to auscultation bilaterally. Normal WOB on room air  GI: Soft, nontender, non-distended  MS: No edema in BLE; No deformity. Neuro:  Nonfocal  Psych: Normal affect   Labs    High Sensitivity Troponin:   Recent Labs  Lab 07/16/22 1921 07/16/22 2155 07/18/22 1443 07/18/22 1837  TROPONINIHS 3 3 15  94*     Chemistry Recent Labs  Lab 07/16/22 1921 07/18/22 1443 07/19/22 0219  NA 141 135 133*  K 3.5 5.0 4.6  CL 107 105 105  CO2 24 20* 16*  GLUCOSE 80 154* 142*  BUN 24* 28* 31*  CREATININE 0.88 1.25* 1.22*  CALCIUM 9.5 9.4 9.1  PROT  --  6.4* 5.9*  ALBUMIN  --  3.8 3.5  AST  --  79* 46*  ALT  --  76* 56*  ALKPHOS  --  65 53  BILITOT  --  2.1* 1.0  GFRNONAA >60 49* 50*  ANIONGAP 10 10 12     Lipids  Recent Labs  Lab 07/18/22 1527  CHOL 110  TRIG 50  HDL 63  LDLCALC 37  CHOLHDL 1.7    Hematology Recent Labs  Lab 07/16/22 1921 07/18/22 1443  WBC 7.7 11.7*  RBC 3.72* 4.17  HGB 11.2* 12.7  HCT 33.2* 38.2  MCV 89.2 91.6  MCH 30.1 30.5  MCHC 33.7 33.2  RDW 14.3 14.4  PLT 227 203   Thyroid No results for input(s): "TSH", "FREET4" in the last 168 hours.  BNPNo results for input(s): "BNP", "PROBNP" in the last 168 hours.  DDimer No results for input(s): "DDIMER" in the last 168 hours.   Radiology    CARDIAC CATHETERIZATION  Result Date: 07/18/2022   The left ventricular systolic function is normal.   LV end diastolic pressure is normal.   The left ventricular ejection fraction is 55-65% by visual estimate.   There is no aortic valve stenosis.   Anticipated discharge date to be determined.   Reinitiate DOAC pending  decision on high-dose ibuprofen or not.  Defer to rounding team. Angiographically normal coronary arteries Normal LVEF and LVEDP. RECOMMENDATIONS: Admit to telemetry floor, will not use IV heparin, will also hold Eliquis pending decision on whether or not to use ibuprofen. Have written for colchicine for possible pericarditis, but will hold off on ibuprofen until we reassess renal function (creatinine was up a little bit today from before) Appears to be somewhat dehydrated, will give her prolonged IV fluid hydration. Check 2D echocardiogram tomorrow to assess for possible pericardial effusion/pericarditis.  Empirically treat for pericarditis as etiology of her ST elevations given relatively recent URI symptoms. Bryan Lemma, MD  DG Chest Portable 1 View  Result Date: 07/18/2022 CLINICAL DATA:  weakness EXAM: PORTABLE CHEST 1 VIEW COMPARISON:  July 16, 2022 FINDINGS: Evaluation is limited by rotation. The cardiomediastinal silhouette is unchanged in contour. No pleural effusion. No pneumothorax. No acute pleuroparenchymal abnormality. IMPRESSION: No acute cardiopulmonary abnormality. Electronically Signed   By: Meda Klinefelter M.D.   On: 07/18/2022 15:43    Cardiac Studies   LHC 07/18/22   The left ventricular systolic function is normal.   LV end diastolic pressure is normal.   The left ventricular ejection fraction is 55-65% by visual estimate.   There is no aortic valve stenosis.   Anticipated discharge date to be determined.   Reinitiate DOAC pending decision on high-dose ibuprofen or not.  Defer to rounding team.   Angiographically normal coronary arteries Normal LVEF and LVEDP.      RECOMMENDATIONS: Admit to telemetry floor, will not use IV heparin, will also hold Eliquis pending decision on whether or not to use ibuprofen. Have written for colchicine for possible pericarditis, but will hold off on ibuprofen until we reassess renal function (creatinine was up a little bit today from  before) Appears to be somewhat dehydrated, will give her prolonged IV fluid hydration. Check 2D echocardiogram tomorrow to assess for possible pericardial effusion/pericarditis.  Empirically treat for pericarditis as etiology of her ST elevations given relatively recent URI symptoms.  Patient Profile     62 y.o. 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 She presented on 6/15 with extreme fatigue and nausea. Initial EKGs  in the ER were relatively normal, but the third EKG showed subtle ST elevations in the inferior leads and a code STEMI was called.   Assessment & Plan    Abnormal EKG  ? Pericarditis  - Initial EKGs in the ED were relatively normal. Third EKG showed diffuse, mild ST elevation and a code STEMI was called. - Patient was taken to the cath lab yesterday -- showed angiographically normal coronary arteries, normal LVEF and LVEDP.  - Working diagnosis of pericarditis- CRP elevated to 2.7. Sed rate normal. Echocardiogram pending today  - Patient is now on colchicine. - Start ibuprofen 400 mg TID, ASA 81 mg daily  - Start protonix 40 mg daily    Tachycardia  - Patient is in sinus tachycardia with HR in the 120s - Possibly secondary to dehydration (creatinine elevated to 1.25) but she became more tachycardic with IV hydration  - Patient denies cough, chills, body aches, urinary symptoms. Ordered CBC to check WBC count/hemoglobin  - Likely due to inflammation/pericarditis - Echocardiogram pending   History of DVT  - patient has been on chronic eliquis 5 mg BID  Elevated LFTs  - On presentation, AST 79, ALT 76. Improved a bit today  - Instructed patient to follow up with PCP as an outpatient   ADDENDUM: Echocardiogram showed a large pericardial effusion with RV diastolic collapse. Patient remains tachycardic to the 120s. BP stable per RN. Plan for pericardiocentesis tomorrow. Holding eliquis. NPO at midnight   For questions  or updates, please contact [facility] Please consult [email or web address] for contact info under        Signed, Jonita Albee, PA-C  07/19/2022, 7:50 AM

## 2022-07-20 ENCOUNTER — Ambulatory Visit: Payer: 59 | Admitting: Orthopaedic Surgery

## 2022-07-20 ENCOUNTER — Inpatient Hospital Stay (HOSPITAL_COMMUNITY): Payer: 59

## 2022-07-20 ENCOUNTER — Encounter (HOSPITAL_COMMUNITY)
Admission: EM | Disposition: A | Discharge status: Home / Self Care | Payer: Self-pay | Source: Home / Self Care | Attending: Cardiology

## 2022-07-20 ENCOUNTER — Encounter (HOSPITAL_COMMUNITY): Payer: Self-pay | Admitting: Cardiology

## 2022-07-20 DIAGNOSIS — I3139 Other pericardial effusion (noninflammatory): Secondary | ICD-10-CM | POA: Diagnosis not present

## 2022-07-20 DIAGNOSIS — I300 Acute nonspecific idiopathic pericarditis: Secondary | ICD-10-CM | POA: Diagnosis not present

## 2022-07-20 HISTORY — PX: PERICARDIOCENTESIS: CATH118255

## 2022-07-20 LAB — BODY FLUID CELL COUNT WITH DIFFERENTIAL
Eos, Fluid: 1 %
Lymphs, Fluid: 6 %
Monocyte-Macrophage-Serous Fluid: 3 % — ABNORMAL LOW (ref 50–90)
Neutrophil Count, Fluid: 90 % — ABNORMAL HIGH (ref 0–25)
Total Nucleated Cell Count, Fluid: 2710 cu mm — ABNORMAL HIGH (ref 0–1000)

## 2022-07-20 LAB — GRAM STAIN

## 2022-07-20 LAB — ECHOCARDIOGRAM LIMITED
Height: 68 in
Weight: 2720 oz

## 2022-07-20 LAB — MRSA NEXT GEN BY PCR, NASAL: MRSA by PCR Next Gen: NOT DETECTED

## 2022-07-20 SURGERY — PERICARDIOCENTESIS
Anesthesia: LOCAL

## 2022-07-20 MED ORDER — MORPHINE SULFATE (PF) 2 MG/ML IV SOLN
1.0000 mg | INTRAVENOUS | Status: DC | PRN
  Administered 2022-07-20 – 2022-07-22 (×2): 2 mg via INTRAVENOUS
  Filled 2022-07-20 (×2): qty 1

## 2022-07-20 MED ORDER — OXYCODONE HCL 5 MG PO TABS
5.0000 mg | ORAL_TABLET | Freq: Four times a day (QID) | ORAL | Status: DC | PRN
  Administered 2022-07-20 (×2): 10 mg via ORAL
  Filled 2022-07-20 (×2): qty 2

## 2022-07-20 MED ORDER — LIDOCAINE HCL (PF) 1 % IJ SOLN
INTRAMUSCULAR | Status: AC
  Filled 2022-07-20: qty 30

## 2022-07-20 MED ORDER — FENTANYL CITRATE (PF) 100 MCG/2ML IJ SOLN
INTRAMUSCULAR | Status: AC
  Filled 2022-07-20: qty 2

## 2022-07-20 MED ORDER — FENTANYL CITRATE (PF) 100 MCG/2ML IJ SOLN
INTRAMUSCULAR | Status: DC | PRN
  Administered 2022-07-20: 25 ug via INTRAVENOUS

## 2022-07-20 MED ORDER — MIDAZOLAM HCL 2 MG/2ML IJ SOLN
INTRAMUSCULAR | Status: AC
  Filled 2022-07-20: qty 2

## 2022-07-20 MED ORDER — HEPARIN (PORCINE) IN NACL 1000-0.9 UT/500ML-% IV SOLN
INTRAVENOUS | Status: DC | PRN
  Administered 2022-07-20: 500 mL

## 2022-07-20 MED ORDER — MIDAZOLAM HCL 2 MG/2ML IJ SOLN
INTRAMUSCULAR | Status: DC | PRN
  Administered 2022-07-20: 1 mg via INTRAVENOUS

## 2022-07-20 MED ORDER — LIDOCAINE HCL (PF) 1 % IJ SOLN
INTRAMUSCULAR | Status: DC | PRN
  Administered 2022-07-20: 10 mL

## 2022-07-20 MED ORDER — CHLORHEXIDINE GLUCONATE CLOTH 2 % EX PADS
6.0000 | MEDICATED_PAD | Freq: Every day | CUTANEOUS | Status: DC
  Administered 2022-07-20 – 2022-07-22 (×4): 6 via TOPICAL

## 2022-07-20 MED FILL — Heparin Sodium (Porcine) Inj 1000 Unit/ML: INTRAMUSCULAR | Qty: 10 | Status: AC

## 2022-07-20 SURGICAL SUPPLY — 2 items
PACK CARDIAC CATHETERIZATION (CUSTOM PROCEDURE TRAY) IMPLANT
TRAY PERICARDIOCENTESIS 6FX60 (TRAY / TRAY PROCEDURE) IMPLANT

## 2022-07-20 NOTE — Interval H&P Note (Signed)
History and Physical Interval Note:  07/20/2022 8:44 AM  Sierra Perez  has presented today for surgery, with the diagnosis of pericardial effusion.  The various methods of treatment have been discussed with the patient and family. After consideration of risks, benefits and other options for treatment, the patient has consented to  Procedure(s): PERICARDIOCENTESIS (N/A) as a surgical intervention.  The patient's history has been reviewed, patient examined, no change in status, stable for surgery.  I have reviewed the patient's chart and labs.  Questions were answered to the patient's satisfaction.     Bryan Lemma

## 2022-07-20 NOTE — Progress Notes (Signed)
 Cardiologist:  Harding  Subjective:  Denies SSCP, palpitations or Dyspnea HR down Discussed pericardiocentesis at length  Objective:  Vitals:   07/19/22 1932 07/19/22 2000 07/19/22 2335 07/20/22 0415  BP: 108/74  106/84 126/80  Pulse: (!) 113 (!) 114 (!) 110 (!) 104  Resp: 18  18 18  Temp: 97.7 F (36.5 C)  98.7 F (37.1 C) 99 F (37.2 C)  TempSrc: Oral  Oral Oral  SpO2: 98% 99% 95% 95%  Weight:      Height:        Intake/Output from previous day:  Intake/Output Summary (Last 24 hours) at 07/20/2022 0837 Last data filed at 07/20/2022 0300 Gross per 24 hour  Intake 365.15 ml  Output --  Net 365.15 ml    Physical Exam:  HR down in 90's now BP stable Lungs clear Bilateral breast implants No murmur/rub Abdomen benign Post left TKR   Lab Results: Basic Metabolic Panel: Recent Labs    07/18/22 1443 07/19/22 0219  NA 135 133*  K 5.0 4.6  CL 105 105  CO2 20* 16*  GLUCOSE 154* 142*  BUN 28* 31*  CREATININE 1.25* 1.22*  CALCIUM 9.4 9.1   Liver Function Tests: Recent Labs    07/18/22 1443 07/19/22 0219  AST 79* 46*  ALT 76* 56*  ALKPHOS 65 53  BILITOT 2.1* 1.0  PROT 6.4* 5.9*  ALBUMIN 3.8 3.5   Recent Labs    07/18/22 1443  LIPASE 36   CBC: Recent Labs    07/18/22 1443 07/19/22 0212  WBC 11.7* 13.0*  NEUTROABS 9.8*  --   HGB 12.7 12.1  HCT 38.2 35.5*  MCV 91.6 91.3  PLT 203 255   Cardiac Enzymes: No results for input(s): "CKTOTAL", "CKMB", "CKMBINDEX", "TROPONINI" in the last 72 hours. BNP: Invalid input(s): "POCBNP" D-Dimer: No results for input(s): "DDIMER" in the last 72 hours. Hemoglobin A1C: Recent Labs    07/18/22 1525  HGBA1C 4.6*   Fasting Lipid Panel: Recent Labs    07/18/22 1527  CHOL 110  HDL 63  LDLCALC 37  TRIG 50  CHOLHDL 1.7   Thyroid Function Tests: No results for input(s): "TSH", "T4TOTAL", "T3FREE", "THYROIDAB" in the last 72 hours.  Invalid input(s): "FREET3" Anemia Panel: No results for  input(s): "VITAMINB12", "FOLATE", "FERRITIN", "TIBC", "IRON", "RETICCTPCT" in the last 72 hours.  Imaging: ECHOCARDIOGRAM COMPLETE  Result Date: 07/19/2022    ECHOCARDIOGRAM REPORT   Patient Name:   Sierra Perez Date of Exam: 07/19/2022 Medical Rec #:  [id number]   Height:       68.0 in Accession #:    [id number]  Weight:       170.0 lb Date of Birth:  05/15/1960  BSA:          1.907 m Patient Age:    61 years    BP:           115/86 mmHg Patient Gender: F           HR:           121 bpm. Exam Location:  Inpatient Procedure: 2D Echo, Cardiac Doppler and Color Doppler Indications:     Chest pain  History:         Patient has prior history of Echocardiogram examinations, most                  recent 03/27/2021. Risk Factors:Diabetes and Hypertension.                    Breast Cancer.  Sonographer:     Melissa Morford RDCS (AE, PE) Referring Phys:  [id number] KATHLEEN R JOHNSON Diagnosing Phys: Gayatri Acharya MD IMPRESSIONS  1. Indeterminate mobile components in pericardial fluid best seen clip 61-62. Large pericardial effusion. RV diastolic collpase seen, plethoric IVC with no respiratory variation. Findings are concerning for cardiac tamponade, Dr. Naeema Patlan/cardiology aware  and evaluating patient.  2. Indeterminate echodensity at the origin of the left subclavian artery, concern for thrombus given history of cancer. Consider CT angiography imaging of arch vessels if clinically indicated.  3. Left ventricular ejection fraction, by estimation, is 60 to 65%. The left ventricle has normal function. Left ventricular endocardial border not optimally defined to evaluate regional wall motion. Left ventricular diastolic parameters are indeterminate.  4. Right ventricular systolic function is mildly reduced. The right ventricular size is normal. Tricuspid regurgitation signal is inadequate for assessing PA pressure.  5. The mitral valve is grossly normal. Trivial mitral valve regurgitation.  6. The aortic valve is grossly normal.  Aortic valve regurgitation is not visualized. No aortic stenosis is present.  7. The inferior vena cava is dilated in size with <50% respiratory variability, suggesting right atrial pressure of 15 mmHg. FINDINGS  Left Ventricle: Left ventricular ejection fraction, by estimation, is 60 to 65%. The left ventricle has normal function. Left ventricular endocardial border not optimally defined to evaluate regional wall motion. The left ventricular internal cavity size was normal in size. There is no left ventricular hypertrophy. Left ventricular diastolic parameters are indeterminate. Right Ventricle: The right ventricular size is normal. Right vetricular wall thickness was not well visualized. Right ventricular systolic function is mildly reduced. Tricuspid regurgitation signal is inadequate for assessing PA pressure. Left Atrium: Left atrial size was normal in size. Right Atrium: Right atrial size was normal in size. Pericardium: Indeterminate mobile components in pericardial fluid best seen clip 61-62. A large pericardial effusion is present. There is diastolic collapse of the right ventricular free wall. There is evidence of cardiac tamponade. Mitral Valve: The mitral valve is grossly normal. Trivial mitral valve regurgitation. Tricuspid Valve: The tricuspid valve is grossly normal. Tricuspid valve regurgitation is mild. Aortic Valve: The aortic valve is grossly normal. Aortic valve regurgitation is not visualized. No aortic stenosis is present. Pulmonic Valve: The pulmonic valve was grossly normal. Pulmonic valve regurgitation is trivial. Aorta: The aortic root is normal in size and structure. Venous: The inferior vena cava is dilated in size with less than 50% respiratory variability, suggesting right atrial pressure of 15 mmHg. IAS/Shunts: The interatrial septum was not assessed.  LEFT VENTRICLE PLAX 2D LVIDd:         3.50 cm   Diastology LVIDs:         2.40 cm   LV e' medial:  6.42 cm/s LV PW:         0.80 cm    LV e' lateral: 6.50 cm/s LV IVS:        0.60 cm LVOT diam:     1.80 cm LV SV:         26 LV SV Index:   13 LVOT Area:     2.54 cm  RIGHT VENTRICLE RV S prime:     13.40 cm/s TAPSE (M-mode): 1.3 cm LEFT ATRIUM             Index        RIGHT ATRIUM          Index LA diam:          2.20 cm 1.15 cm/m   RA Area:     5.38 cm LA Vol (A2C):   32.3 ml 16.93 ml/m  RA Volume:   7.62 ml  4.00 ml/m LA Vol (A4C):   20.5 ml 10.75 ml/m LA Biplane Vol: 26.7 ml 14.00 ml/m  AORTIC VALVE LVOT Vmax:   65.90 cm/s LVOT Vmean:  49.900 cm/s LVOT VTI:    0.101 m  AORTA Ao Root diam: 3.00 cm Ao Asc diam:  2.90 cm  SHUNTS Systemic VTI:  0.10 m Systemic Diam: 1.80 cm Gayatri Acharya MD Electronically signed by Gayatri Acharya MD Signature Date/Time: 07/19/2022/11:16:30 AM    Final (Updated)    CARDIAC CATHETERIZATION  Result Date: 07/18/2022   The left ventricular systolic function is normal.   LV end diastolic pressure is normal.   The left ventricular ejection fraction is 55-65% by visual estimate.   There is no aortic valve stenosis.   Anticipated discharge date to be determined.   Reinitiate DOAC pending decision on high-dose ibuprofen or not.  Defer to rounding team. Angiographically normal coronary arteries Normal LVEF and LVEDP. RECOMMENDATIONS: Admit to telemetry floor, will not use IV heparin, will also hold Eliquis pending decision on whether or not to use ibuprofen. Have written for colchicine for possible pericarditis, but will hold off on ibuprofen until we reassess renal function (creatinine was up a little bit today from before) Appears to be somewhat dehydrated, will give her prolonged IV fluid hydration. Check 2D echocardiogram tomorrow to assess for possible pericardial effusion/pericarditis.  Empirically treat for pericarditis as etiology of her ST elevations given relatively recent URI symptoms. David Harding, MD  DG Chest Portable 1 View  Result Date: 07/18/2022 CLINICAL DATA:  weakness EXAM: PORTABLE CHEST 1  VIEW COMPARISON:  July 16, 2022 FINDINGS: Evaluation is limited by rotation. The cardiomediastinal silhouette is unchanged in contour. No pleural effusion. No pneumothorax. No acute pleuroparenchymal abnormality. IMPRESSION: No acute cardiopulmonary abnormality. Electronically Signed   By: Stephanie  Peacock M.D.   On: 07/18/2022 15:43    Cardiac Studies:  ECG: SR diffuse ST elevation consistent with pericarditis   Telemetry:  SR rates 90's   Echo: Large pericardial effusion with pre tamponade   Medications:    anastrozole  1 mg Oral Daily   aspirin EC  81 mg Oral Daily   cholecalciferol  1,000 Units Oral Daily   colchicine  0.6 mg Oral BID   cyclobenzaprine  10 mg Oral QHS   DULoxetine  60 mg Oral Daily   fluticasone  2 spray Each Nare Daily   gabapentin  600 mg Oral TID   ibuprofen  400 mg Oral TID   ipratropium  2 spray Each Nare BID   levothyroxine  50 mcg Oral q morning   pantoprazole  40 mg Oral Daily   rosuvastatin  5 mg Oral Daily   saccharomyces boulardii  250 mg Oral Daily   sodium chloride flush  3 mL Intravenous Q12H      sodium chloride     sodium chloride 100 mL/hr at 07/19/22 2257    Assessment/Plan:   Pericarditits:  elevated CRP, ECG consistent Cath with no CAD Reviewed and no evidence of perforation during cath. Developed large pericardial effusion Interestingly not there on CT6/13/24 CTA did not suggest recurrent metastatic breast cancer. ? Bloody effusion from inflammation and being on eliquis For pericardiocentesis today Risks discussed at length over weekend and she has a good understanding of procedure  Breast Cancer: with implants fu. With   oncology Fluid to be sent for cytology DVT:  recurrent on chronic eliquis Hypercoag  wu negative in past Initial DVT thought to be from breast cancer and 2nd from Left TKR Patient worried about not being on blood thinner Has SCD;s on and will resume when safe pending composition of pericardial fluid and effectiveness of  drainage  Sierra Perez 07/20/2022, 8:37 AM    

## 2022-07-20 NOTE — H&P (View-Only) (Signed)
Cardiologist:  [name]  Subjective:  Denies SSCP, palpitations or Dyspnea HR down Discussed pericardiocentesis at length  Objective:  Vitals:   07/19/22 1932 07/19/22 2000 07/19/22 2335 07/20/22 0415  BP: 108/74  106/84 126/80  Pulse: (!) 113 (!) 114 (!) 110 (!) 104  Resp: 18  18 18   Temp: 97.7 F (36.5 C)  98.7 F (37.1 C) 99 F (37.2 C)  TempSrc: Oral  Oral Oral  SpO2: 98% 99% 95% 95%  Weight:      Height:        Intake/Output from previous day:  Intake/Output Summary (Last 24 hours) at 07/20/2022 0837 Last data filed at 07/20/2022 0300 Gross per 24 hour  Intake 365.15 ml  Output --  Net 365.15 ml    Physical Exam:  HR down in 90's now BP stable Lungs clear Bilateral breast implants No murmur/rub Abdomen benign Post left TKR   Lab Results: Basic Metabolic Panel: Recent Labs    07/18/22 1443 07/19/22 0219  NA 135 133*  K 5.0 4.6  CL 105 105  CO2 20* 16*  GLUCOSE 154* 142*  BUN 28* 31*  CREATININE 1.25* 1.22*  CALCIUM 9.4 9.1   Liver Function Tests: Recent Labs    07/18/22 1443 07/19/22 0219  AST 79* 46*  ALT 76* 56*  ALKPHOS 65 53  BILITOT 2.1* 1.0  PROT 6.4* 5.9*  ALBUMIN 3.8 3.5   Recent Labs    07/18/22 1443  LIPASE 36   CBC: Recent Labs    07/18/22 1443 07/19/22 0212  WBC 11.7* 13.0*  NEUTROABS 9.8*  --   HGB 12.7 12.1  HCT 38.2 35.5*  MCV 91.6 91.3  PLT 203 255   Cardiac Enzymes: No results for input(s): "CKTOTAL", "CKMB", "CKMBINDEX", "TROPONINI" in the last 72 hours. BNP: Invalid input(s): "POCBNP" D-Dimer: No results for input(s): "DDIMER" in the last 72 hours. Hemoglobin A1C: Recent Labs    07/18/22 1525  HGBA1C 4.6*   Fasting Lipid Panel: Recent Labs    07/18/22 1527  CHOL 110  HDL 63  LDLCALC 37  TRIG 50  CHOLHDL 1.7   Thyroid Function Tests: No results for input(s): "TSH", "T4TOTAL", "T3FREE", "THYROIDAB" in the last 72 hours.  Invalid input(s): "FREET3" Anemia Panel: No results for  input(s): "VITAMINB12", "FOLATE", "FERRITIN", "TIBC", "IRON", "RETICCTPCT" in the last 72 hours.  Imaging: ECHOCARDIOGRAM COMPLETE  Result Date: 07/19/2022    ECHOCARDIOGRAM REPORT   Patient Name:   Jessikah Pyles Date of Exam: 07/19/2022 Medical Rec #:  [id number]   Height:       68.0 in Accession #:    [id number]  Weight:       170.0 lb Date of Birth:  1961/01/08  BSA:          1.907 m Patient Age:    62 years    BP:           115/86 mmHg Patient Gender: F           HR:           121 bpm. Exam Location:  Inpatient Procedure: 2D Echo, Cardiac Doppler and Color Doppler Indications:     Chest pain  History:         Patient has prior history of Echocardiogram examinations, most                  recent 03/27/2021. Risk Factors:Diabetes and Hypertension.  Breast Cancer.  Sonographer:     Trellis Moment RDCS (AE, PE) Referring Phys:  [id number] Jonita Albee Diagnosing Phys: Weston Brass MD IMPRESSIONS  1. Indeterminate mobile components in pericardial fluid best seen clip 61-62. Large pericardial effusion. RV diastolic collpase seen, plethoric IVC with no respiratory variation. Findings are concerning for cardiac tamponade, Dr. Monserrath Junio/cardiology aware  and evaluating patient.  2. Indeterminate echodensity at the origin of the left subclavian artery, concern for thrombus given history of cancer. Consider CT angiography imaging of arch vessels if clinically indicated.  3. Left ventricular ejection fraction, by estimation, is 60 to 65%. The left ventricle has normal function. Left ventricular endocardial border not optimally defined to evaluate regional wall motion. Left ventricular diastolic parameters are indeterminate.  4. Right ventricular systolic function is mildly reduced. The right ventricular size is normal. Tricuspid regurgitation signal is inadequate for assessing PA pressure.  5. The mitral valve is grossly normal. Trivial mitral valve regurgitation.  6. The aortic valve is grossly normal.  Aortic valve regurgitation is not visualized. No aortic stenosis is present.  7. The inferior vena cava is dilated in size with <50% respiratory variability, suggesting right atrial pressure of 15 mmHg. FINDINGS  Left Ventricle: Left ventricular ejection fraction, by estimation, is 60 to 65%. The left ventricle has normal function. Left ventricular endocardial border not optimally defined to evaluate regional wall motion. The left ventricular internal cavity size was normal in size. There is no left ventricular hypertrophy. Left ventricular diastolic parameters are indeterminate. Right Ventricle: The right ventricular size is normal. Right vetricular wall thickness was not well visualized. Right ventricular systolic function is mildly reduced. Tricuspid regurgitation signal is inadequate for assessing PA pressure. Left Atrium: Left atrial size was normal in size. Right Atrium: Right atrial size was normal in size. Pericardium: Indeterminate mobile components in pericardial fluid best seen clip 61-62. A large pericardial effusion is present. There is diastolic collapse of the right ventricular free wall. There is evidence of cardiac tamponade. Mitral Valve: The mitral valve is grossly normal. Trivial mitral valve regurgitation. Tricuspid Valve: The tricuspid valve is grossly normal. Tricuspid valve regurgitation is mild. Aortic Valve: The aortic valve is grossly normal. Aortic valve regurgitation is not visualized. No aortic stenosis is present. Pulmonic Valve: The pulmonic valve was grossly normal. Pulmonic valve regurgitation is trivial. Aorta: The aortic root is normal in size and structure. Venous: The inferior vena cava is dilated in size with less than 50% respiratory variability, suggesting right atrial pressure of 15 mmHg. IAS/Shunts: The interatrial septum was not assessed.  LEFT VENTRICLE PLAX 2D LVIDd:         3.50 cm   Diastology LVIDs:         2.40 cm   LV e' medial:  6.42 cm/s LV PW:         0.80 cm    LV e' lateral: 6.50 cm/s LV IVS:        0.60 cm LVOT diam:     1.80 cm LV SV:         26 LV SV Index:   13 LVOT Area:     2.54 cm  RIGHT VENTRICLE RV S prime:     13.40 cm/s TAPSE (M-mode): 1.3 cm LEFT ATRIUM             Index        RIGHT ATRIUM          Index LA diam:  2.20 cm 1.15 cm/m   RA Area:     5.38 cm LA Vol (A2C):   32.3 ml 16.93 ml/m  RA Volume:   7.62 ml  4.00 ml/m LA Vol (A4C):   20.5 ml 10.75 ml/m LA Biplane Vol: 26.7 ml 14.00 ml/m  AORTIC VALVE LVOT Vmax:   65.90 cm/s LVOT Vmean:  49.900 cm/s LVOT VTI:    0.101 m  AORTA Ao Root diam: 3.00 cm Ao Asc diam:  2.90 cm  SHUNTS Systemic VTI:  0.10 m Systemic Diam: 1.80 cm Weston Brass MD Electronically signed by Weston Brass MD Signature Date/Time: 07/19/2022/11:16:30 AM    Final (Updated)    CARDIAC CATHETERIZATION  Result Date: 07/18/2022   The left ventricular systolic function is normal.   LV end diastolic pressure is normal.   The left ventricular ejection fraction is 55-65% by visual estimate.   There is no aortic valve stenosis.   Anticipated discharge date to be determined.   Reinitiate DOAC pending decision on high-dose ibuprofen or not.  Defer to rounding team. Angiographically normal coronary arteries Normal LVEF and LVEDP. RECOMMENDATIONS: Admit to telemetry floor, will not use IV heparin, will also hold Eliquis pending decision on whether or not to use ibuprofen. Have written for colchicine for possible pericarditis, but will hold off on ibuprofen until we reassess renal function (creatinine was up a little bit today from before) Appears to be somewhat dehydrated, will give her prolonged IV fluid hydration. Check 2D echocardiogram tomorrow to assess for possible pericardial effusion/pericarditis.  Empirically treat for pericarditis as etiology of her ST elevations given relatively recent URI symptoms. Bryan Lemma, MD  DG Chest Portable 1 View  Result Date: 07/18/2022 CLINICAL DATA:  weakness EXAM: PORTABLE CHEST 1  VIEW COMPARISON:  July 16, 2022 FINDINGS: Evaluation is limited by rotation. The cardiomediastinal silhouette is unchanged in contour. No pleural effusion. No pneumothorax. No acute pleuroparenchymal abnormality. IMPRESSION: No acute cardiopulmonary abnormality. Electronically Signed   By: Meda Klinefelter M.D.   On: 07/18/2022 15:43    Cardiac Studies:  ECG: SR diffuse ST elevation consistent with pericarditis   Telemetry:  SR rates 90's   Echo: Large pericardial effusion with pre tamponade   Medications:    anastrozole  1 mg Oral Daily   aspirin EC  81 mg Oral Daily   cholecalciferol  1,000 Units Oral Daily   colchicine  0.6 mg Oral BID   cyclobenzaprine  10 mg Oral QHS   DULoxetine  60 mg Oral Daily   fluticasone  2 spray Each Nare Daily   gabapentin  600 mg Oral TID   ibuprofen  400 mg Oral TID   ipratropium  2 spray Each Nare BID   levothyroxine  50 mcg Oral q morning   pantoprazole  40 mg Oral Daily   rosuvastatin  5 mg Oral Daily   saccharomyces boulardii  250 mg Oral Daily   sodium chloride flush  3 mL Intravenous Q12H      sodium chloride     sodium chloride 100 mL/hr at 07/19/22 2257    Assessment/Plan:   Pericarditits:  elevated CRP, ECG consistent Cath with no CAD Reviewed and no evidence of perforation during cath. Developed large pericardial effusion Interestingly not there on CT6/13/24 CTA did not suggest recurrent metastatic breast cancer. ? Bloody effusion from inflammation and being on eliquis For pericardiocentesis today Risks discussed at length over weekend and she has a good understanding of procedure  Breast Cancer: with implants fu. 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

## 2022-07-20 NOTE — Progress Notes (Signed)
  Echocardiogram 2D Echocardiogram has been performed.  Sierra Perez 07/20/2022, 9:41 AM

## 2022-07-21 ENCOUNTER — Encounter (HOSPITAL_COMMUNITY): Payer: Self-pay

## 2022-07-21 ENCOUNTER — Inpatient Hospital Stay (HOSPITAL_COMMUNITY): Payer: 59

## 2022-07-21 ENCOUNTER — Encounter (HOSPITAL_COMMUNITY): Payer: Self-pay | Admitting: Cardiology

## 2022-07-21 DIAGNOSIS — I480 Paroxysmal atrial fibrillation: Secondary | ICD-10-CM | POA: Diagnosis not present

## 2022-07-21 DIAGNOSIS — I3139 Other pericardial effusion (noninflammatory): Secondary | ICD-10-CM

## 2022-07-21 DIAGNOSIS — I309 Acute pericarditis, unspecified: Secondary | ICD-10-CM | POA: Diagnosis not present

## 2022-07-21 LAB — CBC WITH DIFFERENTIAL/PLATELET
Abs Immature Granulocytes: 0.02 10*3/uL (ref 0.00–0.07)
Basophils Absolute: 0 10*3/uL (ref 0.0–0.1)
Basophils Relative: 0 %
Eosinophils Absolute: 0 10*3/uL (ref 0.0–0.5)
Eosinophils Relative: 1 %
HCT: 29.5 % — ABNORMAL LOW (ref 36.0–46.0)
Hemoglobin: 9.8 g/dL — ABNORMAL LOW (ref 12.0–15.0)
Immature Granulocytes: 0 %
Lymphocytes Relative: 20 %
Lymphs Abs: 1 10*3/uL (ref 0.7–4.0)
MCH: 30 pg (ref 26.0–34.0)
MCHC: 33.2 g/dL (ref 30.0–36.0)
MCV: 90.2 fL (ref 80.0–100.0)
Monocytes Absolute: 0.8 10*3/uL (ref 0.1–1.0)
Monocytes Relative: 15 %
Neutro Abs: 3.3 10*3/uL (ref 1.7–7.7)
Neutrophils Relative %: 64 %
Platelets: 157 10*3/uL (ref 150–400)
RBC: 3.27 MIL/uL — ABNORMAL LOW (ref 3.87–5.11)
RDW: 14.1 % (ref 11.5–15.5)
WBC: 5.2 10*3/uL (ref 4.0–10.5)
nRBC: 0 % (ref 0.0–0.2)

## 2022-07-21 LAB — COMPREHENSIVE METABOLIC PANEL
ALT: 81 U/L — ABNORMAL HIGH (ref 0–44)
AST: 49 U/L — ABNORMAL HIGH (ref 15–41)
Albumin: 2.6 g/dL — ABNORMAL LOW (ref 3.5–5.0)
Alkaline Phosphatase: 47 U/L (ref 38–126)
Anion gap: 7 (ref 5–15)
BUN: 28 mg/dL — ABNORMAL HIGH (ref 8–23)
CO2: 21 mmol/L — ABNORMAL LOW (ref 22–32)
Calcium: 7.8 mg/dL — ABNORMAL LOW (ref 8.9–10.3)
Chloride: 107 mmol/L (ref 98–111)
Creatinine, Ser: 0.82 mg/dL (ref 0.44–1.00)
GFR, Estimated: >60 mL/min (ref >60)
Glucose, Bld: 95 mg/dL (ref 70–99)
Potassium: 3.4 mmol/L — ABNORMAL LOW (ref 3.5–5.1)
Sodium: 135 mmol/L (ref 135–145)
Total Bilirubin: 0.6 mg/dL (ref 0.3–1.2)
Total Protein: 4.7 g/dL — ABNORMAL LOW (ref 6.5–8.1)

## 2022-07-21 LAB — MAGNESIUM: Magnesium: 1.9 mg/dL (ref 1.7–2.4)

## 2022-07-21 LAB — PROTEIN, BODY FLUID (OTHER): Total Protein, Body Fluid Other: 8 g/dL

## 2022-07-21 LAB — ECHOCARDIOGRAM LIMITED
Height: 68 in
Weight: 2720 oz

## 2022-07-21 LAB — TSH: TSH: 1.966 u[IU]/mL (ref 0.350–4.500)

## 2022-07-21 LAB — CYTOLOGY - NON PAP

## 2022-07-21 LAB — LIPOPROTEIN A (LPA): Lipoprotein (a): 11.4 nmol/L (ref <75.0)

## 2022-07-21 LAB — GLUCOSE, BODY FLUID OTHER: Glucose, Body Fluid Other: <2 mg/dL

## 2022-07-21 LAB — PH, BODY FLUID: pH, Body Fluid: 7

## 2022-07-21 MED ORDER — SODIUM CHLORIDE 0.9 % IV SOLN: INTRAVENOUS | Status: DC

## 2022-07-21 MED ORDER — POTASSIUM CHLORIDE CRYS ER 20 MEQ PO TBCR
60.0000 meq | EXTENDED_RELEASE_TABLET | Freq: Once | ORAL | Status: AC
  Administered 2022-07-21: 60 meq via ORAL
  Filled 2022-07-21: qty 3

## 2022-07-21 MED ORDER — MAGNESIUM SULFATE 2 GM/50ML IV SOLN
2.0000 g | Freq: Once | INTRAVENOUS | Status: AC
  Administered 2022-07-21: 2 g via INTRAVENOUS
  Filled 2022-07-21: qty 50

## 2022-07-21 MED ORDER — AMIODARONE HCL IN DEXTROSE 360-4.14 MG/200ML-% IV SOLN
60.0000 mg/h | INTRAVENOUS | Status: DC
  Administered 2022-07-21 (×2): 60 mg/h via INTRAVENOUS
  Filled 2022-07-21: qty 400

## 2022-07-21 MED ORDER — AMIODARONE LOAD VIA INFUSION
150.0000 mg | Freq: Once | INTRAVENOUS | Status: AC
  Administered 2022-07-21: 150 mg via INTRAVENOUS
  Filled 2022-07-21: qty 83.34

## 2022-07-21 MED ORDER — AMIODARONE HCL IN DEXTROSE 360-4.14 MG/200ML-% IV SOLN
30.0000 mg/h | INTRAVENOUS | Status: DC
  Administered 2022-07-21 – 2022-07-22 (×2): 30 mg/h via INTRAVENOUS
  Filled 2022-07-21 (×2): qty 200

## 2022-07-21 MED ORDER — PANTOPRAZOLE SODIUM 40 MG PO TBEC
40.0000 mg | DELAYED_RELEASE_TABLET | Freq: Two times a day (BID) | ORAL | Status: DC
  Administered 2022-07-21 – 2022-07-22 (×2): 40 mg via ORAL
  Filled 2022-07-21 (×2): qty 1

## 2022-07-21 MED ORDER — LOPERAMIDE HCL 2 MG PO CAPS
2.0000 mg | ORAL_CAPSULE | ORAL | Status: DC | PRN
  Administered 2022-07-21: 2 mg via ORAL
  Filled 2022-07-21: qty 1

## 2022-07-21 NOTE — Progress Notes (Signed)
Cardiologist:  [name]  Subjective:  Went into afib this am. Still with drainage from pericardial drain Limited echo deferred due to rapid HR but will do latter this am as just looking for effusion  Objective:  Vitals:   07/21/22 0600 07/21/22 0700 07/21/22 0800 07/21/22 0833  BP: (!) 87/52 (!) 92/47 (!) 92/57   Pulse: 79 86 (!) 133   Resp: 13 17 17    Temp:    98.7 F (37.1 C)  TempSrc:    Oral  SpO2: 93% 93% 97%   Weight:      Height:        Intake/Output from previous day:  Intake/Output Summary (Last 24 hours) at 07/21/2022 0903 Last data filed at 07/21/2022 0800 Gross per 24 hour  Intake 2407.33 ml  Output 300 ml  Net 2107.33 ml    Physical Exam:  HR down in 90's now BP stable Lungs clear Bilateral breast implants No murmur/rub Abdomen benign Post left TKR Subxiphoid pericardial drain in place    Lab Results: Basic Metabolic Panel: Recent Labs    07/18/22 1443 07/19/22 0219  NA 135 133*  K 5.0 4.6  CL 105 105  CO2 20* 16*  GLUCOSE 154* 142*  BUN 28* 31*  CREATININE 1.25* 1.22*  CALCIUM 9.4 9.1   Liver Function Tests: Recent Labs    07/18/22 1443 07/19/22 0219  AST 79* 46*  ALT 76* 56*  ALKPHOS 65 53  BILITOT 2.1* 1.0  PROT 6.4* 5.9*  ALBUMIN 3.8 3.5   Recent Labs    07/18/22 1443  LIPASE 36   CBC: Recent Labs    07/18/22 1443 07/19/22 0212 07/21/22 0825  WBC 11.7* 13.0* 5.2  NEUTROABS 9.8*  --  3.3  HGB 12.7 12.1 9.8*  HCT 38.2 35.5* 29.5*  MCV 91.6 91.3 90.2  PLT 203 255 157   Cardiac Enzymes: No results for input(s): "CKTOTAL", "CKMB", "CKMBINDEX", "TROPONINI" in the last 72 hours. BNP: Invalid input(s): "POCBNP" D-Dimer: No results for input(s): "DDIMER" in the last 72 hours. Hemoglobin A1C: Recent Labs    07/18/22 1525  HGBA1C 4.6*   Fasting Lipid Panel: Recent Labs    07/18/22 1527  CHOL 110  HDL 63  LDLCALC 37  TRIG 50  CHOLHDL 1.7   Thyroid Function Tests: No results for input(s): "TSH",  "T4TOTAL", "T3FREE", "THYROIDAB" in the last 72 hours.  Invalid input(s): "FREET3" Anemia Panel: No results for input(s): "VITAMINB12", "FOLATE", "FERRITIN", "TIBC", "IRON", "RETICCTPCT" in the last 72 hours.  Imaging: DG CHEST PORT 1 VIEW  Result Date: 07/21/2022 CLINICAL DATA:  Pleural effusion EXAM: PORTABLE CHEST 1 VIEW COMPARISON:  Previous studies including the examination of 07/20/2022 FINDINGS: Transverse diameter of heart is increased. There is increased haziness in right mid and right lower lung fields. Left lung is clear. There is no pneumothorax. New small caliber catheter overlying the left side of the heart. This may suggest a mediastinal or pericardial drain. IMPRESSION: Increased density in right mid and right lower lung fields suggest right pleural effusion and possibly underlying atelectasis/pneumonia. Electronically Signed   By: Ernie Avena M.D.   On: 07/21/2022 08:07   DG CHEST PORT 1 VIEW  Result Date: 07/20/2022 CLINICAL DATA:  Status post sub xiphoid pericardiocentesis EXAM: PORTABLE CHEST 1 VIEW COMPARISON:  07/18/2022 FINDINGS: Small tube projects over the cardiac shadow, query pericardial drain. There is new obscuration of the right hemidiaphragm and right heart border compatible with atelectasis or pneumonia. Minimal subsegmental atelectasis along the left hemidiaphragm.  Bilateral breast prostheses noted. No pneumothorax or significant pneumomediastinum. IMPRESSION: 1. New obscuration of the right hemidiaphragm and right heart border compatible with right middle lobe and right lower lobe atelectasis or pneumonia. 2. Minimal subsegmental atelectasis along the left hemidiaphragm. 3. Small tube projects over the cardiac shadow, query pericardial drain. Electronically Signed   By: Gaylyn Rong M.D.   On: 07/20/2022 16:05   ECHOCARDIOGRAM LIMITED  Result Date: 07/20/2022    ECHOCARDIOGRAM LIMITED REPORT   Patient Name:   Sierra Perez Date of Exam: 07/20/2022 Medical  Rec #:  [id number]   Height:       68.0 in Accession #:    [id number]  Weight:       170.0 lb Date of Birth:  [birth year]  BSA:          1.907 m Patient Age:    61 years    BP:           116/67 mmHg Patient Gender: F           HR:           98 bpm. Exam Location:  Inpatient Procedure: Limited Echo Indications:    I31.3 Pericardial effusion  History:        Patient has prior history of Echocardiogram examinations, most                 recent 07/19/2022. Abnormal ECG; Signs/Symptoms:Chest Pain.                 Pericarditis. Pericardial effuion. Tamponade. Breast cancer.  Sonographer:    Sheralyn Boatman RDCS Referring Phys: [id number] DAVID W [facility]  Sonographer Comments: Pericardiocentesis procedure. IMPRESSIONS  1. Left ventricular ejection fraction, by estimation, is 60 to 65%. The left ventricle has normal function. The left ventricle has no regional wall motion abnormalities.  2. Right ventricular systolic function is normal.  3. Left atrial size was mild to moderately dilated.  4. Large pericardial effusion. The pericardial effusion is circumferential. Findings are consistent with cardiac tamponade.  5. The mitral valve is grossly normal.  6. The inferior vena cava is dilated in size with <50% respiratory variability, suggesting right atrial pressure of 15 mmHg.  7. Limited echo to assess pericardial effusion pre and post pericardiocentesis. Prior to tap there is a large echolucent pericardial effusion with signs of early tamponade. Post tap, there is complete drainage of pericardial fluid. FINDINGS  Left Ventricle: Left ventricular ejection fraction, by estimation, is 60 to 65%. The left ventricle has normal function. The left ventricle has no regional wall motion abnormalities. Right Ventricle: Right ventricular systolic function is normal. Left Atrium: Left atrial size was mild to moderately dilated. Right Atrium: Right atrial size was not well visualized. Pericardium: A large pericardial effusion is present. The  pericardial effusion is circumferential. There is evidence of cardiac tamponade. Mitral Valve: The mitral valve is grossly normal. Tricuspid Valve: The tricuspid valve is normal in structure. Tricuspid valve regurgitation is trivial. Venous: The inferior vena cava is dilated in size with less than 50% respiratory variability, suggesting right atrial pressure of 15 mmHg. IVC IVC diam: 2.60 cm Arvilla Meres MD Electronically signed by Arvilla Meres MD Signature Date/Time: 07/20/2022/1:28:41 PM    Final    CARDIAC CATHETERIZATION  Result Date: 07/20/2022   Anticipated discharge date to be determined.   Once Pericardial effusion is stable & Pain has stabilized.   Would hold Eliquis x 1 month minimum Successful Pericardiocentesis via SubXiphoid approach -- ~ 400 mL  of dark bloody fluid. Pigtail Drain sutured in place connected to suction cannister. RECOMMENDATION Transfer to CVICU for post Pericardiocentesis management. Continue to monitor drain output - pull drain when < 20 mL in 24 hr. Limited Echo ordered for AM pCXR today & in AM. Fluid sent to Lab for Lab studies. Bryan Lemma, MD  ECHOCARDIOGRAM COMPLETE  Result Date: 07/19/2022    ECHOCARDIOGRAM REPORT   Patient Name:   Daneka Perez Date of Exam: 07/19/2022 Medical Rec #:  [id number]   Height:       68.0 in Accession #:    [id number]  Weight:       170.0 lb Date of Birth:  1960-06-22  BSA:          1.907 m Patient Age:    61 years    BP:           115/86 mmHg Patient Gender: F           HR:           121 bpm. Exam Location:  Inpatient Procedure: 2D Echo, Cardiac Doppler and Color Doppler Indications:     Chest pain  History:         Patient has prior history of Echocardiogram examinations, most                  recent 03/27/2021. Risk Factors:Diabetes and Hypertension.                  Breast Cancer.  Sonographer:     Trellis Moment RDCS (AE, PE) Referring Phys:  [id number] Jonita Albee Diagnosing Phys: Weston Brass MD IMPRESSIONS  1.  Indeterminate mobile components in pericardial fluid best seen clip 61-62. Large pericardial effusion. RV diastolic collpase seen, plethoric IVC with no respiratory variation. Findings are concerning for cardiac tamponade, Dr. Kahlel Peake/cardiology aware  and evaluating patient.  2. Indeterminate echodensity at the origin of the left subclavian artery, concern for thrombus given history of cancer. Consider CT angiography imaging of arch vessels if clinically indicated.  3. Left ventricular ejection fraction, by estimation, is 60 to 65%. The left ventricle has normal function. Left ventricular endocardial border not optimally defined to evaluate regional wall motion. Left ventricular diastolic parameters are indeterminate.  4. Right ventricular systolic function is mildly reduced. The right ventricular size is normal. Tricuspid regurgitation signal is inadequate for assessing PA pressure.  5. The mitral valve is grossly normal. Trivial mitral valve regurgitation.  6. The aortic valve is grossly normal. Aortic valve regurgitation is not visualized. No aortic stenosis is present.  7. The inferior vena cava is dilated in size with <50% respiratory variability, suggesting right atrial pressure of 15 mmHg. FINDINGS  Left Ventricle: Left ventricular ejection fraction, by estimation, is 60 to 65%. The left ventricle has normal function. Left ventricular endocardial border not optimally defined to evaluate regional wall motion. The left ventricular internal cavity size was normal in size. There is no left ventricular hypertrophy. Left ventricular diastolic parameters are indeterminate. Right Ventricle: The right ventricular size is normal. Right vetricular wall thickness was not well visualized. Right ventricular systolic function is mildly reduced. Tricuspid regurgitation signal is inadequate for assessing PA pressure. Left Atrium: Left atrial size was normal in size. Right Atrium: Right atrial size was normal in size.  Pericardium: Indeterminate mobile components in pericardial fluid best seen clip 61-62. A large pericardial effusion is present. There is diastolic collapse of the right ventricular free wall. There is evidence of cardiac tamponade.  Mitral Valve: The mitral valve is grossly normal. Trivial mitral valve regurgitation. Tricuspid Valve: The tricuspid valve is grossly normal. Tricuspid valve regurgitation is mild. Aortic Valve: The aortic valve is grossly normal. Aortic valve regurgitation is not visualized. No aortic stenosis is present. Pulmonic Valve: The pulmonic valve was grossly normal. Pulmonic valve regurgitation is trivial. Aorta: The aortic root is normal in size and structure. Venous: The inferior vena cava is dilated in size with less than 50% respiratory variability, suggesting right atrial pressure of 15 mmHg. IAS/Shunts: The interatrial septum was not assessed.  LEFT VENTRICLE PLAX 2D LVIDd:         3.50 cm   Diastology LVIDs:         2.40 cm   LV e' medial:  6.42 cm/s LV PW:         0.80 cm   LV e' lateral: 6.50 cm/s LV IVS:        0.60 cm LVOT diam:     1.80 cm LV SV:         26 LV SV Index:   13 LVOT Area:     2.54 cm  RIGHT VENTRICLE RV S prime:     13.40 cm/s TAPSE (M-mode): 1.3 cm LEFT ATRIUM             Index        RIGHT ATRIUM          Index LA diam:        2.20 cm 1.15 cm/m   RA Area:     5.38 cm LA Vol (A2C):   32.3 ml 16.93 ml/m  RA Volume:   7.62 ml  4.00 ml/m LA Vol (A4C):   20.5 ml 10.75 ml/m LA Biplane Vol: 26.7 ml 14.00 ml/m  AORTIC VALVE LVOT Vmax:   65.90 cm/s LVOT Vmean:  49.900 cm/s LVOT VTI:    0.101 m  AORTA Ao Root diam: 3.00 cm Ao Asc diam:  2.90 cm  SHUNTS Systemic VTI:  0.10 m Systemic Diam: 1.80 cm Weston Brass MD Electronically signed by Weston Brass MD Signature Date/Time: 07/19/2022/11:16:30 AM    Final (Updated)     Cardiac Studies:  ECG: afib ST elevation gone    Telemetry:  afib rates 90-100  Echo: Large pericardial effusion with pre tamponade    Medications:    anastrozole  1 mg Oral Daily   aspirin EC  81 mg Oral Daily   Chlorhexidine Gluconate Cloth  6 each Topical Daily   cholecalciferol  1,000 Units Oral Daily   colchicine  0.6 mg Oral BID   cyclobenzaprine  10 mg Oral QHS   DULoxetine  60 mg Oral Daily   fluticasone  2 spray Each Nare Daily   gabapentin  600 mg Oral TID   ibuprofen  400 mg Oral TID   ipratropium  2 spray Each Nare BID   levothyroxine  50 mcg Oral q morning   pantoprazole  40 mg Oral Daily   rosuvastatin  5 mg Oral Daily   saccharomyces boulardii  250 mg Oral Daily   sodium chloride flush  3 mL Intravenous Q12H      sodium chloride     sodium chloride 100 mL/hr at 07/21/22 1610   amiodarone 60 mg/hr (07/21/22 0817)   Followed by   amiodarone      Assessment/Plan:   Pericarditits:  elevated CRP, ECG consistent Cath with no CAD Reviewed and no evidence of perforation during cath. Developed large pericardial effusion  Interestingly not there on CT6/13/24 CTA did not suggest recurrent metastatic breast cancer. ? Bloody effusion from inflammation and being on eliquis Culture/Cytology pending Keep drain in today Limited echo this am. Likely pull drain in am before [facility] if needed Continue colchicine and ASA  Breast Cancer: with implants fu. 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 Tentatively plan [facility] in am if she does not convert on amiodarone Not surprising to develop in setting of pericarditis and effusion Will start eliquis back likely tomorrow if limited echo shows good drainage and tube output down  Charlton Haws 07/21/2022, 9:03 AM

## 2022-07-21 NOTE — Progress Notes (Signed)
Patient A-FIB RVR; will attempt ECHO later today.  Dondra Prader RVT RCS

## 2022-07-21 NOTE — Progress Notes (Signed)
2D Echocardiogram completed  Dondra Prader RVT RCS

## 2022-07-21 NOTE — TOC CM/SW Note (Signed)
Transition of Care [facility]) - Inpatient Brief Assessment   Patient Details  Name: Sierra Perez MRN: [id number] Date of Birth: 05/08/1960  Transition of Care [facility]) CM/SW Contact:    Gala Lewandowsky, RN Phone Number: 07/21/2022, 3:39 PM   Clinical Narrative: Patient presented for fatigue and chest pain. Post pericardiocentesis and tentative plan is for Cardioversion 07-22-22. Case Manager will continue to follow for transition of care needs as the patient progresses.        Transition of Care Asessment: Insurance and Status: Insurance coverage has been reviewed Patient has primary care physician: Yes   Prior/Current Home Services: No current home services Social Determinants of Health Reivew: SDOH reviewed no interventions necessary Readmission risk has been reviewed: Yes Transition of care needs: no transition of care needs at this time

## 2022-07-21 NOTE — Progress Notes (Signed)
   RN called pt in a fib with RVR and BP systolic in 80s --will add IV amiodarone for now.  Dr. Eden Emms to see this AM.   CXR pending along with limited echo this AM. Check labs.   Nada Boozer, FNP-C At [facility]  Pgr:[phone] or after 5pm and on weekends call [phone] 07/21/2022.now

## 2022-07-22 ENCOUNTER — Other Ambulatory Visit: Payer: Self-pay | Admitting: Physician Assistant

## 2022-07-22 ENCOUNTER — Encounter: Payer: Self-pay | Admitting: Hematology and Oncology

## 2022-07-22 ENCOUNTER — Other Ambulatory Visit (HOSPITAL_COMMUNITY): Payer: Self-pay

## 2022-07-22 DIAGNOSIS — I480 Paroxysmal atrial fibrillation: Secondary | ICD-10-CM | POA: Insufficient documentation

## 2022-07-22 DIAGNOSIS — Z7901 Long term (current) use of anticoagulants: Secondary | ICD-10-CM

## 2022-07-22 DIAGNOSIS — I4891 Unspecified atrial fibrillation: Secondary | ICD-10-CM

## 2022-07-22 DIAGNOSIS — Z5181 Encounter for therapeutic drug level monitoring: Secondary | ICD-10-CM | POA: Diagnosis not present

## 2022-07-22 DIAGNOSIS — I3139 Other pericardial effusion (noninflammatory): Secondary | ICD-10-CM

## 2022-07-22 DIAGNOSIS — I308 Other forms of acute pericarditis: Secondary | ICD-10-CM | POA: Diagnosis not present

## 2022-07-22 DIAGNOSIS — I300 Acute nonspecific idiopathic pericarditis: Secondary | ICD-10-CM

## 2022-07-22 LAB — CBC
HCT: 28.5 % — ABNORMAL LOW (ref 36.0–46.0)
Hemoglobin: 9.4 g/dL — ABNORMAL LOW (ref 12.0–15.0)
MCH: 30.3 pg (ref 26.0–34.0)
MCHC: 33 g/dL (ref 30.0–36.0)
MCV: 91.9 fL (ref 80.0–100.0)
Platelets: 193 10*3/uL (ref 150–400)
RBC: 3.1 MIL/uL — ABNORMAL LOW (ref 3.87–5.11)
RDW: 14.6 % (ref 11.5–15.5)
WBC: 4.7 10*3/uL (ref 4.0–10.5)
nRBC: 0 % (ref 0.0–0.2)

## 2022-07-22 LAB — BASIC METABOLIC PANEL
Anion gap: 5 (ref 5–15)
BUN: 14 mg/dL (ref 8–23)
CO2: 21 mmol/L — ABNORMAL LOW (ref 22–32)
Calcium: 7.8 mg/dL — ABNORMAL LOW (ref 8.9–10.3)
Chloride: 112 mmol/L — ABNORMAL HIGH (ref 98–111)
Creatinine, Ser: 0.63 mg/dL (ref 0.44–1.00)
GFR, Estimated: >60 mL/min (ref >60)
Glucose, Bld: 88 mg/dL (ref 70–99)
Potassium: 3.6 mmol/L (ref 3.5–5.1)
Sodium: 138 mmol/L (ref 135–145)

## 2022-07-22 LAB — MAGNESIUM: Magnesium: 1.9 mg/dL (ref 1.7–2.4)

## 2022-07-22 LAB — CULTURE, BODY FLUID W GRAM STAIN -BOTTLE

## 2022-07-22 SURGERY — CARDIOVERSION
Anesthesia: Monitor Anesthesia Care

## 2022-07-22 MED ORDER — AMIODARONE HCL 200 MG PO TABS
200.0000 mg | ORAL_TABLET | Freq: Every day | ORAL | Status: DC
  Administered 2022-07-22: 200 mg via ORAL
  Filled 2022-07-22: qty 1

## 2022-07-22 MED ORDER — COLCHICINE 0.6 MG PO TABS
0.6000 mg | ORAL_TABLET | Freq: Two times a day (BID) | ORAL | 0 refills | Status: DC
  Filled 2022-07-22: qty 60, 30d supply, fill #0
  Filled 2022-09-09: qty 60, 30d supply, fill #1

## 2022-07-22 MED ORDER — IBUPROFEN 400 MG PO TABS
400.0000 mg | ORAL_TABLET | Freq: Three times a day (TID) | ORAL | 0 refills | Status: AC
  Filled 2022-07-22: qty 42, 14d supply, fill #0

## 2022-07-22 MED ORDER — POTASSIUM CHLORIDE CRYS ER 20 MEQ PO TBCR
60.0000 meq | EXTENDED_RELEASE_TABLET | Freq: Once | ORAL | Status: AC
  Administered 2022-07-22: 60 meq via ORAL
  Filled 2022-07-22: qty 3

## 2022-07-22 MED ORDER — AMIODARONE HCL 200 MG PO TABS
200.0000 mg | ORAL_TABLET | Freq: Every day | ORAL | 3 refills | Status: DC
  Filled 2022-07-22: qty 30, 30d supply, fill #0
  Filled 2022-08-21: qty 30, 30d supply, fill #1
  Filled 2022-09-19: qty 30, 30d supply, fill #2

## 2022-07-22 MED ORDER — APIXABAN 5 MG PO TABS: 5.0000 mg | ORAL_TABLET | Freq: Two times a day (BID) | ORAL | 2 refills | Status: DC

## 2022-07-22 MED ORDER — PANTOPRAZOLE SODIUM 40 MG PO TBEC
40.0000 mg | DELAYED_RELEASE_TABLET | Freq: Two times a day (BID) | ORAL | 3 refills | Status: DC
  Filled 2022-07-22: qty 60, 30d supply, fill #0

## 2022-07-22 NOTE — Progress Notes (Signed)
Cardiologist:  [name]  Subjective:  Went into afib this am. Still with drainage from pericardial drain Limited echo deferred due to rapid HR but will do latter this am as just looking for effusion  Objective:  Vitals:   07/22/22 0600 07/22/22 0700 07/22/22 0736 07/22/22 0800  BP: (!) 79/55 (!) 83/61  100/76  Pulse: 65 66  69  Resp: 14 13  18   Temp:   97.8 F (36.6 C)   TempSrc:   Oral   SpO2: 96% 97%  98%  Weight:      Height:        Intake/Output from previous day:  Intake/Output Summary (Last 24 hours) at 07/22/2022 0911 Last data filed at 07/22/2022 0800 Gross per 24 hour  Intake 2614.49 ml  Output 150 ml  Net 2464.49 ml    Physical Exam:  HR down in 90's now BP stable Lungs clear Bilateral breast implants No murmur/rub Abdomen benign Post left TKR Subxiphoid pericardial drain in place    Lab Results: Basic Metabolic Panel: Recent Labs    07/21/22 0825  NA 135  K 3.4*  CL 107  CO2 21*  GLUCOSE 95  BUN 28*  CREATININE 0.82  CALCIUM 7.8*  MG 1.9   Liver Function Tests: Recent Labs    07/21/22 0825  AST 49*  ALT 81*  ALKPHOS 47  BILITOT 0.6  PROT 4.7*  ALBUMIN 2.6*   No results for input(s): "LIPASE", "AMYLASE" in the last 72 hours.  CBC: Recent Labs    07/21/22 0825  WBC 5.2  NEUTROABS 3.3  HGB 9.8*  HCT 29.5*  MCV 90.2  PLT 157   Cardiac Enzymes: No results for input(s): "CKTOTAL", "CKMB", "CKMBINDEX", "TROPONINI" in the last 72 hours. BNP: Invalid input(s): "POCBNP" D-Dimer: No results for input(s): "DDIMER" in the last 72 hours. Hemoglobin A1C: No results for input(s): "HGBA1C" in the last 72 hours.  Fasting Lipid Panel: No results for input(s): "CHOL", "HDL", "LDLCALC", "TRIG", "CHOLHDL", "LDLDIRECT" in the last 72 hours.  Thyroid Function Tests: Recent Labs    07/21/22 0820  TSH 1.966   Anemia Panel: No results for input(s): "VITAMINB12", "FOLATE", "FERRITIN", "TIBC", "IRON", "RETICCTPCT" in the last 72  hours.  Imaging: ECHOCARDIOGRAM LIMITED  Result Date: 07/21/2022    ECHOCARDIOGRAM LIMITED REPORT   Patient Name:   Nelma Sabol Date of Exam: 07/21/2022 Medical Rec #:  [id number]   Height:       68.0 in Accession #:    [id number]  Weight:       170.0 lb Date of Birth:  March 10, 1960  BSA:          1.907 m Patient Age:    62 years    BP:           92/47 mmHg Patient Gender: F           HR:           81 bpm. Exam Location:  Inpatient Procedure: Limited Echo and Cardiac Doppler Indications:    I31.3 Pericardial effusion (noninflammatory)  History:        Patient has prior history of Echocardiogram examinations, most                 recent 07/20/2023. PERICARDIOCENTESIS 617/2025.  Sonographer:    Dondra Prader RVT RCS Referring Phys: [id number] DAVID W HARDING IMPRESSIONS  1. Left ventricular ejection fraction, by estimation, is 60 to 65%. The left ventricle has normal function. The left ventricle has no regional  wall motion abnormalities. Left ventricular diastolic parameters were normal.  2. Right ventricular systolic function is normal. The right ventricular size is normal. Tricuspid regurgitation signal is inadequate for assessing PA pressure.  3. The inferior vena cava is dilated in size with <50% respiratory variability, suggesting right atrial pressure of 15 mmHg. FINDINGS  Left Ventricle: There is a prominent septal "bounce" and the inferior vena cava is dilated, but there is no evidence of exaggerated respiratory variation in the mitral and tricuspid flow patterns. Left ventricular ejection fraction, by estimation, is 60  to 65%. The left ventricle has normal function. The left ventricle has no regional wall motion abnormalities. The left ventricular internal cavity size was normal in size. There is no left ventricular hypertrophy. Left ventricular diastolic parameters were normal. Right Ventricle: The right ventricular size is normal. Right ventricular systolic function is normal. Tricuspid regurgitation signal is  inadequate for assessing PA pressure. Pericardium: There is no evidence of pericardial effusion. Venous: The inferior vena cava is dilated in size with less than 50% respiratory variability, suggesting right atrial pressure of 15 mmHg. IVC IVC diam: 2.50 cm Thurmon Fair MD Electronically signed by Thurmon Fair MD Signature Date/Time: 07/21/2022/10:55:25 AM    Final    DG CHEST PORT 1 VIEW  Result Date: 07/21/2022 CLINICAL DATA:  Pleural effusion EXAM: PORTABLE CHEST 1 VIEW COMPARISON:  Previous studies including the examination of 07/20/2022 FINDINGS: Transverse diameter of heart is increased. There is increased haziness in right mid and right lower lung fields. Left lung is clear. There is no pneumothorax. New small caliber catheter overlying the left side of the heart. This may suggest a mediastinal or pericardial drain. IMPRESSION: Increased density in right mid and right lower lung fields suggest right pleural effusion and possibly underlying atelectasis/pneumonia. Electronically Signed   By: Ernie Avena M.D.   On: 07/21/2022 08:07   DG CHEST PORT 1 VIEW  Result Date: 07/20/2022 CLINICAL DATA:  Status post sub xiphoid pericardiocentesis EXAM: PORTABLE CHEST 1 VIEW COMPARISON:  07/18/2022 FINDINGS: Small tube projects over the cardiac shadow, query pericardial drain. There is new obscuration of the right hemidiaphragm and right heart border compatible with atelectasis or pneumonia. Minimal subsegmental atelectasis along the left hemidiaphragm. Bilateral breast prostheses noted. No pneumothorax or significant pneumomediastinum. IMPRESSION: 1. New obscuration of the right hemidiaphragm and right heart border compatible with right middle lobe and right lower lobe atelectasis or pneumonia. 2. Minimal subsegmental atelectasis along the left hemidiaphragm. 3. Small tube projects over the cardiac shadow, query pericardial drain. Electronically Signed   By: Gaylyn Rong M.D.   On: 07/20/2022  16:05   ECHOCARDIOGRAM LIMITED  Result Date: 07/20/2022    ECHOCARDIOGRAM LIMITED REPORT   Patient Name:   Jaquelyne Sabine Date of Exam: 07/20/2022 Medical Rec #:  [id number]   Height:       68.0 in Accession #:    [id number]  Weight:       170.0 lb Date of Birth:  01-Nov-1960  BSA:          1.907 m Patient Age:    62 years    BP:           116/67 mmHg Patient Gender: F           HR:           98 bpm. Exam Location:  Inpatient Procedure: Limited Echo Indications:    I31.3 Pericardial effusion  History:        Patient has prior  history of Echocardiogram examinations, most                 recent 07/19/2022. Abnormal ECG; Signs/Symptoms:Chest Pain.                 Pericarditis. Pericardial effuion. Tamponade. Breast cancer.  Sonographer:    Sheralyn Boatman RDCS Referring Phys: [id number] DAVID W [facility]  Sonographer Comments: Pericardiocentesis procedure. IMPRESSIONS  1. Left ventricular ejection fraction, by estimation, is 60 to 65%. The left ventricle has normal function. The left ventricle has no regional wall motion abnormalities.  2. Right ventricular systolic function is normal.  3. Left atrial size was mild to moderately dilated.  4. Large pericardial effusion. The pericardial effusion is circumferential. Findings are consistent with cardiac tamponade.  5. The mitral valve is grossly normal.  6. The inferior vena cava is dilated in size with <50% respiratory variability, suggesting right atrial pressure of 15 mmHg.  7. Limited echo to assess pericardial effusion pre and post pericardiocentesis. Prior to tap there is a large echolucent pericardial effusion with signs of early tamponade. Post tap, there is complete drainage of pericardial fluid. FINDINGS  Left Ventricle: Left ventricular ejection fraction, by estimation, is 60 to 65%. The left ventricle has normal function. The left ventricle has no regional wall motion abnormalities. Right Ventricle: Right ventricular systolic function is normal. Left Atrium: Left atrial size  was mild to moderately dilated. Right Atrium: Right atrial size was not well visualized. Pericardium: A large pericardial effusion is present. The pericardial effusion is circumferential. There is evidence of cardiac tamponade. Mitral Valve: The mitral valve is grossly normal. Tricuspid Valve: The tricuspid valve is normal in structure. Tricuspid valve regurgitation is trivial. Venous: The inferior vena cava is dilated in size with less than 50% respiratory variability, suggesting right atrial pressure of 15 mmHg. IVC IVC diam: 2.60 cm Arvilla Meres MD Electronically signed by Arvilla Meres MD Signature Date/Time: 07/20/2022/1:28:41 PM    Final     Cardiac Studies:  ECG: afib ST elevation gone    Telemetry:  afib rates 90-100  Echo: Large pericardial effusion with pre tamponade   Medications:    anastrozole  1 mg Oral Daily   aspirin EC  81 mg Oral Daily   Chlorhexidine Gluconate Cloth  6 each Topical Daily   cholecalciferol  1,000 Units Oral Daily   colchicine  0.6 mg Oral BID   cyclobenzaprine  10 mg Oral QHS   DULoxetine  60 mg Oral Daily   fluticasone  2 spray Each Nare Daily   gabapentin  600 mg Oral TID   ibuprofen  400 mg Oral TID   ipratropium  2 spray Each Nare BID   levothyroxine  50 mcg Oral q morning   pantoprazole  40 mg Oral BID   rosuvastatin  5 mg Oral Daily   saccharomyces boulardii  250 mg Oral Daily   sodium chloride flush  3 mL Intravenous Q12H      sodium chloride     sodium chloride 100 mL/hr at 07/22/22 0800   sodium chloride Stopped (07/21/22 1110)   amiodarone 30 mg/hr (07/22/22 0800)    Assessment/Plan:   Pericarditits:  elevated CRP, ECG consistent Cath with no CAD Reviewed and no evidence of perforation during cath. Developed large pericardial effusion Interestingly not there on CT6/13/24 CTA did not suggest recurrent metastatic breast cancer. ? Bloody effusion from inflammation and being on eliquis Culture/Cytology pending Pericardial drain pulled  this am with no issues. Only about  10 cc of serosanguinous fluid suctioned. TTE yesterday with no effusion. Continue colchicine, motrin and bid protonix  Breast Cancer: with implants fu. 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 30 day monitor on d/c on eliquis chronically for recurrent DVT   D/c home latter today if hemodynamics stable and ambulatory  Charlton Haws 07/22/2022, 9:11 AM

## 2022-07-22 NOTE — Progress Notes (Signed)
Ordered echo for 7-10 days to monitor pericardial effusion on OAC.

## 2022-07-22 NOTE — Progress Notes (Signed)
30 day monitor

## 2022-07-22 NOTE — Discharge Summary (Signed)
Discharge Summary    Patient ID: Sierra Perez MRN: [id number]; DOB: 26-Feb-1960  Admit date: 07/18/2022 Discharge date: 07/22/2022  PCP:  Georgann Housekeeper, MD   [facility] Providers Cardiologist:  Bryan Lemma, MD   Discharge Diagnoses    Principal Problem:   Pericarditis Active Problems:   DVT (deep venous thrombosis) (HCC)   Chronic anticoagulation   Clotting disorder (HCC)   ST elevation   Chest pain of uncertain etiology   Pericardial effusion   New onset a-fib [facility])    Diagnostic Studies/Procedures    Echo limited 07/21/22: 1. Left ventricular ejection fraction, by estimation, is 60 to 65%. The  left ventricle has normal function. The left ventricle has no regional  wall motion abnormalities. Left ventricular diastolic parameters were  normal.   2. Right ventricular systolic function is normal. The right ventricular  size is normal. Tricuspid regurgitation signal is inadequate for assessing  PA pressure.   3. The inferior vena cava is dilated in size with <50% respiratory  variability, suggesting right atrial pressure of 15 mmHg.    Echo limited 07/20/22:  1. Left ventricular ejection fraction, by estimation, is 60 to 65%. The  left ventricle has normal function. The left ventricle has no regional  wall motion abnormalities.   2. Right ventricular systolic function is normal.   3. Left atrial size was mild to moderately dilated.   4. Large pericardial effusion. The pericardial effusion is  circumferential. Findings are consistent with cardiac tamponade.   5. The mitral valve is grossly normal.   6. The inferior vena cava is dilated in size with <50% respiratory  variability, suggesting right atrial pressure of 15 mmHg.   7. Limited echo to assess pericardial effusion pre and post  pericardiocentesis. Prior to tap there is a large echolucent pericardial  effusion with signs of early tamponade. Post tap, there is complete  drainage of pericardial  fluid.    Pericardiocentesis 07/20/22:   Anticipated discharge date to be determined.   Once Pericardial effusion is stable & Pain has stabilized.   Would hold Eliquis x 1 month minimum   Successful Pericardiocentesis via SubXiphoid approach -- ~ 400 mL of dark bloody fluid. Pigtail Drain sutured in place connected to suction cannister.    RECOMMENDATION Transfer to CVICU for post Pericardiocentesis management. Continue to monitor drain output - pull drain when < 20 mL in 24 hr. Limited Echo ordered for AM pCXR today & in AM. Fluid sent to Lab for Lab studies.   Echo 07/19/22:  1. Indeterminate mobile components in pericardial fluid best seen clip  61-62. Large pericardial effusion. RV diastolic collpase seen, plethoric  IVC with no respiratory variation. Findings are concerning for cardiac  tamponade, Dr. Nishan/cardiology aware   and evaluating patient.   2. Indeterminate echodensity at the origin of the left subclavian artery,  concern for thrombus given history of cancer. Consider CT angiography  imaging of arch vessels if clinically indicated.   3. Left ventricular ejection fraction, by estimation, is 60 to 65%. The  left ventricle has normal function. Left ventricular endocardial border  not optimally defined to evaluate regional wall motion. Left ventricular  diastolic parameters are  indeterminate.   4. Right ventricular systolic function is mildly reduced. The right  ventricular size is normal. Tricuspid regurgitation signal is inadequate  for assessing PA pressure.   5. The mitral valve is grossly normal. Trivial mitral valve  regurgitation.   6. The aortic valve is grossly normal. Aortic  valve regurgitation is not  visualized. No aortic stenosis is present.   7. The inferior vena cava is dilated in size with <50% respiratory  variability, suggesting right atrial pressure of 15 mmHg.   _____________  Left heart cath 07/18/22:   The left ventricular systolic  function is normal.   LV end diastolic pressure is normal.   The left ventricular ejection fraction is 55-65% by visual estimate.   There is no aortic valve stenosis.   Anticipated discharge date to be determined.   Reinitiate DOAC pending decision on high-dose ibuprofen or not.  Defer to rounding team.   Angiographically normal coronary arteries Normal LVEF and LVEDP.      RECOMMENDATIONS: Admit to telemetry floor, will not use IV heparin, will also hold Eliquis pending decision on whether or not to use ibuprofen. Have written for colchicine for possible pericarditis, but will hold off on ibuprofen until we reassess renal function (creatinine was up a little bit today from before) Appears to be somewhat dehydrated, will give her prolonged IV fluid hydration. Check 2D echocardiogram tomorrow to assess for possible pericardial effusion/pericarditis.  Empirically treat for pericarditis as etiology of her ST elevations given relatively recent URI symptoms.   History of Present Illness     Sierra Perez is a 62 y.o. 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   Ms. Easton was recently seein @ [place] on 6/13 for CP - troponin Negative.  Pain was worse with inspiration & certain movements. No real associated dyspnea - just feels bad in general.  Was d/c from ER with no notable EKG changes & negative Troponin + Chest CTA on 6/13, but CP persisted all day on 6/14. Slept in recliner overnight - was worst lying down.  Pain not constant.   This AM- 6/15 - she woke up with extreme fatigue & nause - was diaphoretic - ("very sweaty, grey& clammy).  What she describes now is more of epigastric discomfort radiating upwards.  On presentation 07/18/22 she described more feeling fatigued and nauseated.  She was extremely diaphoretic.   Initial EKGs in the  ER were relatively normal with unusual ST segments in the inferior leads, no elevations, however the third EKG did show subtle ST elevations in the inferior leads and so code STEMI was called.  Last Eliquis was 07/17/22 PM.   Delay to Dx - initial EKG not really c/w STE, but repeat #2 more prominent horizontal STE in Inferior Leads, so STEMI called.  Clinical presentation not consistent with MI, therefore ER physician did not call code STEMI until more diagnostic EKG from 3:20 PM.  Clinically the patient does not appear to be having an inferior STEMI and EKG is not necessarily definitive for STEMI.  However because of her ongoing issues with chest discomfort and her just not feeling well, I determined that we would proceed with cardiac catheterization for definitive evaluation.  The other differential diagnosis would be pericarditis.   She has been brought to the cardiac catheterization lab as a possible STEMI after long discussion with the patient and her friend-discussing the risk, benefits alternatives and indications.  She is currently pain-free and resting comfortably.  Hospital Course     Consultants: none  Pericarditis Pericardial effusion Follow up echocardiogram after heart cath showed a large pericardial effusion with physiology suggestive of possible "pre-tamponade" - she  underwent therapeutic/diagnostic pericardiocentesis - given concern for recurrent breast cancer, fluid sent for pathology - elevated CRP, sed rate normal - no pericardial effusion seen on initial CT, no evidence of perforation on cath - initially treated with 0.6 mg colchicine BID and 400 mg ibuprofen - reviewed with pharmD - 0.6 mg colchicine BID OK with low dose 200 mg amiodarone - if not tolerating BID colchicine, can reduce to daily at follow up - repeat limited echo scheduled in 1 week   Chest pain Heart catheterization showed angiographically normal coronaries. Suspect chest pain due to effusion and  pericarditis, as above.    PAF She developed Afib with RVR 07/21/22 - new diagnosis this admission with SBP in the 80s. She was treated with IV amiodarone.  Suspect due to pericardial drain Remains in sinus rhythm Started on amiodarone 200 mg daily for rhythm control Will order 30 day monitor at discharge. If no further Afib, can consider D/C amiodarone. Will remain on OAC, as below.   Recurrent DVT Chronic OAC - has been on eliquis 5 mg BID - firset DVT likely related to cancer - recurrent 2 years ago after TKR - failed xarelto and lovenox - continue eliquis, restart tonight   Pt seen and examined by Dr. Eden Emms today and deemed stable for discharge. Follow up has been arranged.      Did the patient have an acute coronary syndrome (MI, NSTEMI, STEMI, etc) this admission?:  No                               Did the patient have a percutaneous coronary intervention (stent / angioplasty)?:  No.        The patient will be scheduled for a TOC follow up appointment in 7-14 days.  A message has been sent to the [facility] and Scheduling Pool at the office where the patient should be seen for follow up.  _____________  Discharge Vitals Blood pressure 102/70, pulse 74, temperature 97.8 F (36.6 C), temperature source Axillary, resp. rate 17, height 5\' 8"  (1.727 m), weight 77.1 kg, SpO2 96 %.  Filed Weights   07/18/22 1426  Weight: 77.1 kg    Labs & Radiologic Studies    CBC Recent Labs    07/21/22 0825 07/22/22 0909  WBC 5.2 4.7  NEUTROABS 3.3  --   HGB 9.8* 9.4*  HCT 29.5* 28.5*  MCV 90.2 91.9  PLT 157 193   Basic Metabolic Panel Recent Labs    16/10/96 0825 07/22/22 0909  NA 135 138  K 3.4* 3.6  CL 107 112*  CO2 21* 21*  GLUCOSE 95 88  BUN 28* 14  CREATININE 0.82 0.63  CALCIUM 7.8* 7.8*  MG 1.9 1.9   Liver Function Tests Recent Labs    07/21/22 0825  AST 49*  ALT 81*  ALKPHOS 47  BILITOT 0.6  PROT 4.7*  ALBUMIN 2.6*   No results for input(s):  "LIPASE", "AMYLASE" in the last 72 hours. High Sensitivity Troponin:   Recent Labs  Lab 07/16/22 1921 07/16/22 2155 07/18/22 1443 07/18/22 1837  TROPONINIHS 3 3 15  94*    BNP Invalid input(s): "POCBNP" D-Dimer No results for input(s): "DDIMER" in the last 72 hours. Hemoglobin A1C No results for input(s): "HGBA1C" in the last 72 hours. Fasting Lipid Panel No results for input(s): "CHOL", "HDL", "LDLCALC", "TRIG", "CHOLHDL", "LDLDIRECT" in the last 72 hours. Thyroid Function Tests Recent Labs  07/21/22 0820  TSH 1.966   _____________  ECHOCARDIOGRAM LIMITED  Result Date: 07/21/2022    ECHOCARDIOGRAM LIMITED REPORT   Patient Name:   Ellise Yodice Date of Exam: 07/21/2022 Medical Rec #:  [id number]   Height:       68.0 in Accession #:    [id number]  Weight:       170.0 lb Date of Birth:  1960-08-24  BSA:          1.907 m Patient Age:    61 years    BP:           92/47 mmHg Patient Gender: F           HR:           81 bpm. Exam Location:  Inpatient Procedure: Limited Echo and Cardiac Doppler Indications:    I31.3 Pericardial effusion (noninflammatory)  History:        Patient has prior history of Echocardiogram examinations, most                 recent 07/20/2023. PERICARDIOCENTESIS 617/2025.  Sonographer:    Dondra Prader RVT RCS Referring Phys: 50 DAVID W HARDING IMPRESSIONS  1. Left ventricular ejection fraction, by estimation, is 60 to 65%. The left ventricle has normal function. The left ventricle has no regional wall motion abnormalities. Left ventricular diastolic parameters were normal.  2. Right ventricular systolic function is normal. The right ventricular size is normal. Tricuspid regurgitation signal is inadequate for assessing PA pressure.  3. The inferior vena cava is dilated in size with <50% respiratory variability, suggesting right atrial pressure of 15 mmHg. FINDINGS  Left Ventricle: There is a prominent septal "bounce" and the inferior vena cava is dilated, but there is no  evidence of exaggerated respiratory variation in the mitral and tricuspid flow patterns. Left ventricular ejection fraction, by estimation, is 60  to 65%. The left ventricle has normal function. The left ventricle has no regional wall motion abnormalities. The left ventricular internal cavity size was normal in size. There is no left ventricular hypertrophy. Left ventricular diastolic parameters were normal. Right Ventricle: The right ventricular size is normal. Right ventricular systolic function is normal. Tricuspid regurgitation signal is inadequate for assessing PA pressure. Pericardium: There is no evidence of pericardial effusion. Venous: The inferior vena cava is dilated in size with less than 50% respiratory variability, suggesting right atrial pressure of 15 mmHg. IVC IVC diam: 2.50 cm Thurmon Fair MD Electronically signed by Thurmon Fair MD Signature Date/Time: 07/21/2022/10:55:25 AM    Final    DG CHEST PORT 1 VIEW  Result Date: 07/21/2022 CLINICAL DATA:  Pleural effusion EXAM: PORTABLE CHEST 1 VIEW COMPARISON:  Previous studies including the examination of 07/20/2022 FINDINGS: Transverse diameter of heart is increased. There is increased haziness in right mid and right lower lung fields. Left lung is clear. There is no pneumothorax. New small caliber catheter overlying the left side of the heart. This may suggest a mediastinal or pericardial drain. IMPRESSION: Increased density in right mid and right lower lung fields suggest right pleural effusion and possibly underlying atelectasis/pneumonia. Electronically Signed   By: Ernie Avena M.D.   On: 07/21/2022 08:07   DG CHEST PORT 1 VIEW  Result Date: 07/20/2022 CLINICAL DATA:  Status post sub xiphoid pericardiocentesis EXAM: PORTABLE CHEST 1 VIEW COMPARISON:  07/18/2022 FINDINGS: Small tube projects over the cardiac shadow, query pericardial drain. There is new obscuration of the right hemidiaphragm and right heart border compatible with  atelectasis or pneumonia. Minimal subsegmental atelectasis along the left hemidiaphragm. Bilateral breast prostheses noted. No pneumothorax or significant pneumomediastinum. IMPRESSION: 1. New obscuration of the right hemidiaphragm and right heart border compatible with right middle lobe and right lower lobe atelectasis or pneumonia. 2. Minimal subsegmental atelectasis along the left hemidiaphragm. 3. Small tube projects over the cardiac shadow, query pericardial drain. Electronically Signed   By: Gaylyn Rong M.D.   On: 07/20/2022 16:05   ECHOCARDIOGRAM LIMITED  Result Date: 07/20/2022    ECHOCARDIOGRAM LIMITED REPORT   Patient Name:   Margree Anastasia Date of Exam: 07/20/2022 Medical Rec #:  [id number]   Height:       68.0 in Accession #:    [id number]  Weight:       170.0 lb Date of Birth:  1960/07/11  BSA:          1.907 m Patient Age:    61 years    BP:           116/67 mmHg Patient Gender: F           HR:           98 bpm. Exam Location:  Inpatient Procedure: Limited Echo Indications:    I31.3 Pericardial effusion  History:        Patient has prior history of Echocardiogram examinations, most                 recent 07/19/2022. Abnormal ECG; Signs/Symptoms:Chest Pain.                 Pericarditis. Pericardial effuion. Tamponade. Breast cancer.  Sonographer:    Sheralyn Boatman RDCS Referring Phys: [id number] DAVID W [facility]  Sonographer Comments: Pericardiocentesis procedure. IMPRESSIONS  1. Left ventricular ejection fraction, by estimation, is 60 to 65%. The left ventricle has normal function. The left ventricle has no regional wall motion abnormalities.  2. Right ventricular systolic function is normal.  3. Left atrial size was mild to moderately dilated.  4. Large pericardial effusion. The pericardial effusion is circumferential. Findings are consistent with cardiac tamponade.  5. The mitral valve is grossly normal.  6. The inferior vena cava is dilated in size with <50% respiratory variability, suggesting right  atrial pressure of 15 mmHg.  7. Limited echo to assess pericardial effusion pre and post pericardiocentesis. Prior to tap there is a large echolucent pericardial effusion with signs of early tamponade. Post tap, there is complete drainage of pericardial fluid. FINDINGS  Left Ventricle: Left ventricular ejection fraction, by estimation, is 60 to 65%. The left ventricle has normal function. The left ventricle has no regional wall motion abnormalities. Right Ventricle: Right ventricular systolic function is normal. Left Atrium: Left atrial size was mild to moderately dilated. Right Atrium: Right atrial size was not well visualized. Pericardium: A large pericardial effusion is present. The pericardial effusion is circumferential. There is evidence of cardiac tamponade. Mitral Valve: The mitral valve is grossly normal. Tricuspid Valve: The tricuspid valve is normal in structure. Tricuspid valve regurgitation is trivial. Venous: The inferior vena cava is dilated in size with less than 50% respiratory variability, suggesting right atrial pressure of 15 mmHg. IVC IVC diam: 2.60 cm Arvilla Meres MD Electronically signed by Arvilla Meres MD Signature Date/Time: 07/20/2022/1:28:41 PM    Final    CARDIAC CATHETERIZATION  Result Date: 07/20/2022   Anticipated discharge date to be determined.   Once Pericardial effusion is stable & Pain has stabilized.   Would hold Eliquis x 1 month minimum  Successful Pericardiocentesis via SubXiphoid approach -- ~ 400 mL of dark bloody fluid. Pigtail Drain sutured in place connected to suction cannister. RECOMMENDATION Transfer to CVICU for post Pericardiocentesis management. Continue to monitor drain output - pull drain when < 20 mL in 24 hr. Limited Echo ordered for AM pCXR today & in AM. Fluid sent to Lab for Lab studies. Bryan Lemma, MD  ECHOCARDIOGRAM COMPLETE  Result Date: 07/19/2022    ECHOCARDIOGRAM REPORT   Patient Name:   Darina Lempke Date of Exam: 07/19/2022 Medical Rec  #:  [id number]   Height:       68.0 in Accession #:    [id number]  Weight:       170.0 lb Date of Birth:  January 23, 1961  BSA:          1.907 m Patient Age:    61 years    BP:           115/86 mmHg Patient Gender: F           HR:           121 bpm. Exam Location:  Inpatient Procedure: 2D Echo, Cardiac Doppler and Color Doppler Indications:     Chest pain  History:         Patient has prior history of Echocardiogram examinations, most                  recent 03/27/2021. Risk Factors:Diabetes and Hypertension.                  Breast Cancer.  Sonographer:     Trellis Moment RDCS (AE, PE) Referring Phys:  [id number] Jonita Albee Diagnosing Phys: Weston Brass MD IMPRESSIONS  1. Indeterminate mobile components in pericardial fluid best seen clip 61-62. Large pericardial effusion. RV diastolic collpase seen, plethoric IVC with no respiratory variation. Findings are concerning for cardiac tamponade, Dr. Nishan/cardiology aware  and evaluating patient.  2. Indeterminate echodensity at the origin of the left subclavian artery, concern for thrombus given history of cancer. Consider CT angiography imaging of arch vessels if clinically indicated.  3. Left ventricular ejection fraction, by estimation, is 60 to 65%. The left ventricle has normal function. Left ventricular endocardial border not optimally defined to evaluate regional wall motion. Left ventricular diastolic parameters are indeterminate.  4. Right ventricular systolic function is mildly reduced. The right ventricular size is normal. Tricuspid regurgitation signal is inadequate for assessing PA pressure.  5. The mitral valve is grossly normal. Trivial mitral valve regurgitation.  6. The aortic valve is grossly normal. Aortic valve regurgitation is not visualized. No aortic stenosis is present.  7. The inferior vena cava is dilated in size with <50% respiratory variability, suggesting right atrial pressure of 15 mmHg. FINDINGS  Left Ventricle: Left ventricular  ejection fraction, by estimation, is 60 to 65%. The left ventricle has normal function. Left ventricular endocardial border not optimally defined to evaluate regional wall motion. The left ventricular internal cavity size was normal in size. There is no left ventricular hypertrophy. Left ventricular diastolic parameters are indeterminate. Right Ventricle: The right ventricular size is normal. Right vetricular wall thickness was not well visualized. Right ventricular systolic function is mildly reduced. Tricuspid regurgitation signal is inadequate for assessing PA pressure. Left Atrium: Left atrial size was normal in size. Right Atrium: Right atrial size was normal in size. Pericardium: Indeterminate mobile components in pericardial fluid best seen clip 61-62. A large pericardial effusion is present. There is diastolic collapse of the right  ventricular free wall. There is evidence of cardiac tamponade. Mitral Valve: The mitral valve is grossly normal. Trivial mitral valve regurgitation. Tricuspid Valve: The tricuspid valve is grossly normal. Tricuspid valve regurgitation is mild. Aortic Valve: The aortic valve is grossly normal. Aortic valve regurgitation is not visualized. No aortic stenosis is present. Pulmonic Valve: The pulmonic valve was grossly normal. Pulmonic valve regurgitation is trivial. Aorta: The aortic root is normal in size and structure. Venous: The inferior vena cava is dilated in size with less than 50% respiratory variability, suggesting right atrial pressure of 15 mmHg. IAS/Shunts: The interatrial septum was not assessed.  LEFT VENTRICLE PLAX 2D LVIDd:         3.50 cm   Diastology LVIDs:         2.40 cm   LV e' medial:  6.42 cm/s LV PW:         0.80 cm   LV e' lateral: 6.50 cm/s LV IVS:        0.60 cm LVOT diam:     1.80 cm LV SV:         26 LV SV Index:   13 LVOT Area:     2.54 cm  RIGHT VENTRICLE RV S prime:     13.40 cm/s TAPSE (M-mode): 1.3 cm LEFT ATRIUM             Index        RIGHT ATRIUM           Index LA diam:        2.20 cm 1.15 cm/m   RA Area:     5.38 cm LA Vol (A2C):   32.3 ml 16.93 ml/m  RA Volume:   7.62 ml  4.00 ml/m LA Vol (A4C):   20.5 ml 10.75 ml/m LA Biplane Vol: 26.7 ml 14.00 ml/m  AORTIC VALVE LVOT Vmax:   65.90 cm/s LVOT Vmean:  49.900 cm/s LVOT VTI:    0.101 m  AORTA Ao Root diam: 3.00 cm Ao Asc diam:  2.90 cm  SHUNTS Systemic VTI:  0.10 m Systemic Diam: 1.80 cm Weston Brass MD Electronically signed by Weston Brass MD Signature Date/Time: 07/19/2022/11:16:30 AM    Final (Updated)    CARDIAC CATHETERIZATION  Result Date: 07/18/2022   The left ventricular systolic function is normal.   LV end diastolic pressure is normal.   The left ventricular ejection fraction is 55-65% by visual estimate.   There is no aortic valve stenosis.   Anticipated discharge date to be determined.   Reinitiate DOAC pending decision on high-dose ibuprofen or not.  Defer to rounding team. Angiographically normal coronary arteries Normal LVEF and LVEDP. RECOMMENDATIONS: Admit to telemetry floor, will not use IV heparin, will also hold Eliquis pending decision on whether or not to use ibuprofen. Have written for colchicine for possible pericarditis, but will hold off on ibuprofen until we reassess renal function (creatinine was up a little bit today from before) Appears to be somewhat dehydrated, will give her prolonged IV fluid hydration. Check 2D echocardiogram tomorrow to assess for possible pericardial effusion/pericarditis.  Empirically treat for pericarditis as etiology of her ST elevations given relatively recent URI symptoms. Bryan Lemma, MD  DG Chest Portable 1 View  Result Date: 07/18/2022 CLINICAL DATA:  weakness EXAM: PORTABLE CHEST 1 VIEW COMPARISON:  July 16, 2022 FINDINGS: Evaluation is limited by rotation. The cardiomediastinal silhouette is unchanged in contour. No pleural effusion. No pneumothorax. No acute pleuroparenchymal abnormality. IMPRESSION: No acute cardiopulmonary  abnormality. Electronically  Signed   By: Meda Klinefelter M.D.   On: 07/18/2022 15:43   CT Angio Chest PE W and/or Wo Contrast  Result Date: 07/16/2022 CLINICAL DATA:  Chest pain. EXAM: CT ANGIOGRAPHY CHEST WITH CONTRAST TECHNIQUE: Multidetector CT imaging of the chest was performed using the standard protocol during bolus administration of intravenous contrast. Multiplanar CT image reconstructions and MIPs were obtained to evaluate the vascular anatomy. RADIATION DOSE REDUCTION: This exam was performed according to the departmental dose-optimization program which includes automated exposure control, adjustment of the mA and/or kV according to patient size and/or use of iterative reconstruction technique. CONTRAST:  75mL OMNIPAQUE IOHEXOL 350 MG/ML SOLN COMPARISON:  March 04, 2021 FINDINGS: Cardiovascular: The thoracic aorta is normal in appearance. Satisfactory opacification of the pulmonary arteries to the segmental level. No evidence of pulmonary embolism. Normal heart size. No pericardial effusion. Mediastinum/Nodes: No enlarged mediastinal, hilar, or axillary lymph nodes. Thyroid gland, trachea, and esophagus demonstrate no significant findings. Lungs/Pleura: A stable 3 mm noncalcified lung nodule is seen within the anterolateral aspect of the right lower lobe (axial CT image 79, CT series 6). There is no evidence of an acute infiltrate, pleural effusion or pneumothorax. Upper Abdomen: Surgical sutures are seen within the gastric region. Musculoskeletal: Bilateral breast implants are seen. No acute osseous abnormalities are identified. Review of the MIP images confirms the above findings. IMPRESSION: 1. No evidence of pulmonary embolism or acute cardiopulmonary disease. 2. Stable 3 mm noncalcified right lower lobe lung nodule. No follow-up needed if patient is low-risk.This recommendation follows the consensus statement: Guidelines for Management of Incidental Pulmonary Nodules Detected on CT Images:  From the Fleischner Society 2017; Radiology 2017; 284:228-243. Electronically Signed   By: Aram Candela M.D.   On: 07/16/2022 22:31   DG Chest 2 View  Result Date: 07/16/2022 CLINICAL DATA:  Chest pain. EXAM: CHEST - 2 VIEW COMPARISON:  Cardiac CT 03/04/2021 FINDINGS: The cardiomediastinal contours are normal. The lungs are clear. Pulmonary vasculature is normal. No consolidation, pleural effusion, or pneumothorax. No acute osseous abnormalities are seen. Added density over the right lung bases related to overlying breast implant. IMPRESSION: No acute chest findings. Electronically Signed   By: Narda Rutherford M.D.   On: 07/16/2022 19:52   Disposition   Pt is being discharged home today in good condition.  Follow-up Plans & Appointments     Follow-up Information     Joylene Grapes, NP Follow up on 07/31/2022.   Specialties: Cardiology, Family Medicine Why: 8:50 am Contact information: [address] [place] [address] [phone]         [facility] Follow up on 07/30/2022.   Specialty: Cardiology Why: 4:05 pm for echocardiogram Contact information: [address] [id number] mc [place] [address] [phone]                  Discharge Medications   Allergies as of 07/22/2022       Reactions   Succinylcholine Other (See Comments)   Muscle aching 18 years ago from 07/02/20   Azithromycin    **DOES NOT WORK** **DOES NOT WORK**   Keflex [cephalexin] Diarrhea, Nausea And Vomiting   Silver Rash   Sulfa Antibiotics Rash   And swelling generalized not in throat   Sulfasalazine Rash        Medication List     STOP taking these medications    amLODipine 5 MG tablet Commonly known as: NORVASC  TAKE these medications    acetaminophen 500 MG tablet Commonly known as: TYLENOL Take 1,000 mg by mouth every 6 (six) hours as needed for moderate pain.   albuterol 108 (90  Base) MCG/ACT inhaler Commonly known as: VENTOLIN HFA Inhale 1 puff into the lungs every 6 (six) hours as needed for wheezing or shortness of breath.   amiodarone 200 MG tablet Commonly known as: PACERONE Take 1 tablet (200 mg total) by mouth daily. Start taking on: July 23, 2022   anastrozole 1 MG tablet Commonly known as: ARIMIDEX TAKE 1 TABLET(1 MG) BY MOUTH DAILY What changed: See the new instructions.   apixaban 5 MG Tabs tablet Commonly known as: Eliquis Take 1 tablet (5 mg total) by mouth 2 (two) times daily. Resume 07/22/22 PM dose. What changed: See the new instructions.   buPROPion 150 MG 24 hr tablet Commonly known as: WELLBUTRIN XL Take 150 mg by mouth daily.   CALCIUM CITRATE PO Take 3 tablets by mouth daily.   colchicine 0.6 MG tablet Take 1 tablet (0.6 mg total) by mouth 2 (two) times daily.   CULTURELLE PO Take 1 capsule by mouth daily.   cyclobenzaprine 10 MG tablet Commonly known as: FLEXERIL Take 1 tablet (10 mg total) by mouth at bedtime.   DULoxetine 60 MG capsule Commonly known as: CYMBALTA Take 60 mg by mouth daily.   fluticasone 50 MCG/ACT nasal spray Commonly known as: FLONASE Place 2 sprays into both nostrils daily.   gabapentin 600 MG tablet Commonly known as: NEURONTIN TAKE 1 TABLET(600 MG) BY MOUTH THREE TIMES DAILY What changed: See the new instructions.   ibuprofen 400 MG tablet Commonly known as: ADVIL Take 1 tablet (400 mg total) by mouth 3 (three) times daily for 14 days.   ipratropium 0.03 % nasal spray Commonly known as: ATROVENT Place 2 sprays into both nostrils 2 (two) times daily.   levothyroxine 50 MCG tablet Commonly known as: SYNTHROID Take 50 mcg by mouth every morning.   lidocaine 5 % Commonly known as: Lidoderm Place 1 patch onto the skin daily. Remove & Discard patch within 12 hours or as directed by MD   Mounjaro 12.5 MG/0.5ML Pen Generic drug: tirzepatide Inject 12.5 mg into the skin once a week.    pantoprazole 40 MG tablet Commonly known as: Protonix Take 1 tablet (40 mg total) by mouth 2 (two) times daily. What changed: when to take this   rosuvastatin 5 MG tablet Commonly known as: CRESTOR Take 1 tablet (5 mg total) by mouth daily.   valACYclovir 500 MG tablet Commonly known as: VALTREX Take 1 tablet (500 mg total) by mouth daily. Increase to bid x 3 days with symptoms What changed:  when to take this reasons to take this   Vitamin D-3 25 MCG (1000 UT) Caps Take 1,000 Units by mouth daily.           Outstanding Labs/Studies   Limited echo 6/27  30 day monitor - to be placed after limited echo 6/27  Duration of Discharge Encounter   Greater than 30 minutes including physician time.  Signed, Roe Rutherford Ulla Mckiernan, PA 07/22/2022, 1:59 PM

## 2022-07-24 ENCOUNTER — Telehealth: Payer: Self-pay | Admitting: Cardiology

## 2022-07-24 ENCOUNTER — Other Ambulatory Visit (HOSPITAL_COMMUNITY): Payer: Self-pay

## 2022-07-24 NOTE — Telephone Encounter (Signed)
Colchicine frequently causes diarrhea. She was just prescribed this for pericarditis/pericardial effusion. Discharge summary states "if not tolerating BID colchicine, can reduce to daily at follow up." Can use Imodium as needed but would avoid using frequently as it can cause QTc prolongation and so can her amiodarone. If diarrhea persists, would decrease her colchicine from twice daily to daily per discharge summary.

## 2022-07-24 NOTE — Telephone Encounter (Signed)
Pt c/o medication issue:  1. Name of Medication:  amiodarone (PACERONE) 200 MG tablet  colchicine 0.6 MG tablet  High Dose Advil  2. How are you currently taking this medication (dosage and times per day)? As prescribed  3. Are you having a reaction (difficulty breathing--STAT)?   4. What is your medication issue?   Patient stated she started on these new medications and she has been having diarrhea since.  Patient stated best time to reach her is after 12:00 pm today.

## 2022-07-24 NOTE — Telephone Encounter (Signed)
Left voicemail message for the pt to call the clinic. 

## 2022-07-24 NOTE — Telephone Encounter (Signed)
Patient states starting colchicine and amiodarone she has had diarrhea. She wanted to know what OTC medication she can take for the diarrhea. She stated she will continue to take both medications and see if the diarrhea stops soon. If not she will discuss at her appointment with Irving Burton.   I did advise she can take imodium. Will forward to pharmD for more options

## 2022-07-25 LAB — CULTURE, BODY FLUID W GRAM STAIN -BOTTLE: Culture: NO GROWTH

## 2022-07-27 NOTE — Telephone Encounter (Signed)
Patient returning call.

## 2022-07-27 NOTE — Telephone Encounter (Signed)
Patient aware . Will continue taking both medication .  She aware to keep upcoming appointment

## 2022-07-28 NOTE — Progress Notes (Signed)
HEMATOLOGY-ONCOLOGY TELEPHONE VISIT PROGRESS NOTE  I connected with our patient on 07/29/22 at  1:30 PM EDT by telephone and verified that I am speaking with the correct person using two identifiers.  I discussed the limitations, risks, security and privacy concerns of performing an evaluation and management service by telephone and the availability of in person appointments.  I also discussed with the patient that there may be a patient responsible charge related to this service. The patient expressed understanding and agreed to proceed.   History of Present Illness: Sierra Perez is a 62 y.o. female with above-mentioned history of right breast cancer previously treated with neoadjuvant chemotherapy followed by lumpectomy and radiation for HER-2 positive breast cancer. She presents to the clinic for a telephone follow-up.  After recent hospitalization for pericardial effusion and tamponade status post pericardiocentesis.  She is here also developed atrial fibrillation during the hospitalization and is currently on medications for that.  She is healing and recovering very well and is connecting with me by telephone to discuss these results.  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-, PR-, HER positive 3+, Ki67 20-25%   08/27/2012 Cancer Staging   Staging form: Breast, AJCC 8th Edition - Clinical stage from 08/27/2012: Stage IIA (cT2, cN0, cM0, G3, ER-, PR-, HER2+) - Signed by [name], MD on 11/17/2019   09/06/2012 Breast MRI   Right breast, lateral to the nipple, 10x14x17mm mass with mildly irregular borders   09/10/2012 Echocardiogram   EF 55-60%    Chemotherapy   Neoadjuvant Taxotere/Carboplatin/Herceptin/Perjeta followed by adjuvant Herceptin maintenance   01/09/2013 Breast MRI   Complete pathological response to neoadjuvant treatment: right  breast mass previously noted at 9:00 position no longer identified.   02/13/2013 Surgery   Right breast lumpectomy: benign parenchyma with some stromal fibrosis consistent with previous tumor site, focal area of atypical lobular hyperplasia, all lymph nodes negative.     Radiation Therapy   Adjuvant radiation   04/06/2013 Imaging   DEXA: T-score of -1.1, osteopenia    09/25/2016 Initial Biopsy   Columnar cell alteration with atypia, foreign body granuloma   05/10/2020 Relapse/Recurrence   Breast MRI detected new linear non-mass enhancement right breast 2.1 cm, non-mass enhancement centrally left breast spanning 10.5 cm, left breast nipple areolar complex enhancement: Biopsy right breast: Grade 2 invasive lobular cancer.  ER/PR positive HER-2 equivocal by IHC FISH pending, Ki-67 1% left breast biopsy anterior fibrocystic change, posterior atypical lobular hyperplasia/LCIS   05/26/2020 Genetic Testing   Negative genetic testing on the CancerNext-Expanded+RNAinsight panel.  The CancerNext-Expanded gene panel offered by [facility] and includes sequencing and rearrangement analysis for the following 77 genes: AIP, ALK, APC*, ATM*, AXIN2, BAP1, BARD1, BLM, BMPR1A, BRCA1*, BRCA2*, BRIP1*, CDC73, CDH1*, CDK4, CDKN1B, CDKN2A, CHEK2*, CTNNA1, DICER1, FANCC, FH, FLCN, GALNT12, KIF1B, LZTR1, MAX, MEN1, MET, MLH1*, MSH2*, MSH3, MSH6*, MUTYH*, NBN, NF1*, NF2, NTHL1, PALB2*, PHOX2B, PMS2*, POT1, PRKAR1A, PTCH1, PTEN*, RAD51C*, RAD51D*, RB1, RECQL, RET, SDHA, SDHAF2, SDHB, SDHC, SDHD, SMAD4, SMARCA4, SMARCB1, SMARCE1, STK11, SUFU, TMEM127, TP53*, TSC1, TSC2, VHL and XRCC2 (sequencing and deletion/duplication); EGFR, EGLN1, HOXB13, KIT, MITF, PDGFRA, POLD1, and POLE (sequencing only); EPCAM and GREM1 (deletion/duplication only). DNA and RNA analyses performed for * genes. The report date is May 26, 2020.    07/08/2020 Surgery   Bilateral mastectomies with reconstruction: Right mastectomy: Grade 2 ILC 0.9 cm, ALH,  margins negative, 0/1 lymph, left mastectomy: Benign    08/2020 -  Anti-estrogen oral therapy   adjuvant antiestrogen therapy with anastrozole 1 mg daily x7 years started July 2022      REVIEW OF SYSTEMS:   Constitutional: Denies fevers, chills or abnormal weight loss All other systems were reviewed with the patient and are negative. Observations/Objective:     Assessment Plan:  Malignant neoplasm of upper-outer quadrant of right breast in female, estrogen receptor negative (HCC) 08/27/2012: Right breast: Stage Ia IDC with DCIS T2 N0 M0 ER negative PR negative, HER-2 positive, Ki-67 20 to 25% status post neoadjuvant TCHP: Complete pathologic response, adjuvant radiation   05/10/2020: Recurrence/relapse: Right breast: 2.1 cm non-mass enhancement: Biopsy grade 2 invasive lobular cancer ER/PR positive HER-2 equivocal by IHC FISH pending, Ki-67 1% Left breast: 10.5 cm non-mass enhancement anterior biopsy fibrocystic change, posterior biopsy ALH/LCIS   Treatment plan: 1.   07/08/2020 Bilateral mastectomies with reconstruction: Right mastectomy: Grade 2 ILC 0.9 cm, ALH, margins negative, 0/1 lymph, left mastectomy: Benign 2. adjuvant antiestrogen therapy with anastrozole 1 mg daily x7 years started July 2022 Genetic testing: Neg ------------------------------------------------------------ Current treatment: Adjuvant antiestrogen therapy with anastrozole 1 mg daily x7 years started July 2022 Anastrozole Toxicities: Denies any major side effects to anastrozole therapy. Palpable lump in the right axilla: Ultrasound 03/24/2022: No sonographic evidence of malignancy.  CT angio chest 07/16/2022: No PE, stable 3 mm right lung nodule Hospitalization 07/18/2022-07/22/2022: (Chest Pain leaning forward) Pericarditis and pericardial effusion status post pericardiocentesis: Cytology benign, A.Fib Anemia: During hospitalization.  I discussed with her that it will improve with time.  Return to clinic in November  as previously scheduled.   I discussed the assessment and treatment plan with the patient. The patient was provided an opportunity to ask questions and all were answered. The patient agreed with the plan and demonstrated an understanding of the instructions. The patient was advised to call back or seek an in-person evaluation if the symptoms worsen or if the condition fails to improve as anticipated.   I provided 12 minutes of non-face-to-face time during this encounter.  This includes time for charting and coordination of care   Tamsen Meek, MD

## 2022-07-29 ENCOUNTER — Inpatient Hospital Stay: Payer: 59 | Attending: Hematology and Oncology | Admitting: Hematology and Oncology

## 2022-07-29 DIAGNOSIS — C50411 Malignant neoplasm of upper-outer quadrant of right female breast: Secondary | ICD-10-CM | POA: Diagnosis not present

## 2022-07-29 DIAGNOSIS — Z171 Estrogen receptor negative status [ER-]: Secondary | ICD-10-CM | POA: Diagnosis not present

## 2022-07-29 NOTE — Assessment & Plan Note (Addendum)
08/27/2012: Right breast: Stage Ia IDC with DCIS T2 N0 M0 ER negative PR negative, HER-2 positive, Ki-67 20 to 25% status post neoadjuvant TCHP: Complete pathologic response, adjuvant radiation   05/10/2020: Recurrence/relapse: Right breast: 2.1 cm non-mass enhancement: Biopsy grade 2 invasive lobular cancer ER/PR positive HER-2 equivocal by IHC FISH pending, Ki-67 1% Left breast: 10.5 cm non-mass enhancement anterior biopsy fibrocystic change, posterior biopsy ALH/LCIS   Treatment plan: 1.   07/08/2020 Bilateral mastectomies with reconstruction: Right mastectomy: Grade 2 ILC 0.9 cm, ALH, margins negative, 0/1 lymph, left mastectomy: Benign 2. adjuvant antiestrogen therapy with anastrozole 1 mg daily x7 years started July 2022 Genetic testing ------------------------------------------------------------ Current treatment: Adjuvant antiestrogen therapy with anastrozole 1 mg daily x7 years started July 2022 Anastrozole Toxicities: Denies any major side effects to anastrozole therapy. Palpable lump in the right axilla: Ultrasound 03/24/2022: No sonographic evidence of malignancy.  Hospitalization 07/18/2022-07/22/2022: Pericarditis and pericardial effusion status post pericardiocentesis: Cytology benign CT angio chest 07/16/2022: No PE, stable 3 mm right lung nodule  Return to clinic in 1 year for follow up

## 2022-07-30 ENCOUNTER — Ambulatory Visit (HOSPITAL_COMMUNITY): Payer: 59 | Attending: Internal Medicine

## 2022-07-30 DIAGNOSIS — I3139 Other pericardial effusion (noninflammatory): Secondary | ICD-10-CM

## 2022-07-30 DIAGNOSIS — I300 Acute nonspecific idiopathic pericarditis: Secondary | ICD-10-CM | POA: Diagnosis present

## 2022-07-30 LAB — ECHOCARDIOGRAM LIMITED
Area-P 1/2: 5.13 cm2
MV M vel: 5.27 m/s
MV Peak grad: 111.1 mmHg
P 1/2 time: 410 msec
S' Lateral: 3.2 cm

## 2022-07-31 ENCOUNTER — Ambulatory Visit: Payer: 59 | Attending: Nurse Practitioner | Admitting: Nurse Practitioner

## 2022-07-31 ENCOUNTER — Encounter: Payer: Self-pay | Admitting: Nurse Practitioner

## 2022-07-31 VITALS — BP 112/80 | HR 61 | Ht 68.0 in | Wt 169.0 lb

## 2022-07-31 DIAGNOSIS — E119 Type 2 diabetes mellitus without complications: Secondary | ICD-10-CM

## 2022-07-31 DIAGNOSIS — Z86718 Personal history of other venous thrombosis and embolism: Secondary | ICD-10-CM | POA: Diagnosis not present

## 2022-07-31 DIAGNOSIS — E039 Hypothyroidism, unspecified: Secondary | ICD-10-CM

## 2022-07-31 DIAGNOSIS — I3139 Other pericardial effusion (noninflammatory): Secondary | ICD-10-CM | POA: Diagnosis not present

## 2022-07-31 DIAGNOSIS — I10 Essential (primary) hypertension: Secondary | ICD-10-CM

## 2022-07-31 DIAGNOSIS — I480 Paroxysmal atrial fibrillation: Secondary | ICD-10-CM

## 2022-07-31 DIAGNOSIS — Z7985 Long-term (current) use of injectable non-insulin antidiabetic drugs: Secondary | ICD-10-CM

## 2022-07-31 DIAGNOSIS — I340 Nonrheumatic mitral (valve) insufficiency: Secondary | ICD-10-CM

## 2022-07-31 DIAGNOSIS — I351 Nonrheumatic aortic (valve) insufficiency: Secondary | ICD-10-CM

## 2022-07-31 DIAGNOSIS — I4891 Unspecified atrial fibrillation: Secondary | ICD-10-CM | POA: Diagnosis not present

## 2022-07-31 DIAGNOSIS — Z171 Estrogen receptor negative status [ER-]: Secondary | ICD-10-CM

## 2022-07-31 DIAGNOSIS — C50411 Malignant neoplasm of upper-outer quadrant of right female breast: Secondary | ICD-10-CM

## 2022-07-31 NOTE — Patient Instructions (Addendum)
Medication Instructions:  Colchicine .6 mg daily   *If you need a refill on your cardiac medications before your next appointment, please call your pharmacy*   Lab Work: NONE ordered at this time of appointment     Testing/Procedures: Your physician has requested that you have an echocardiogram in 3 months. Echocardiography is a painless test that uses sound waves to create images of your heart. It provides your doctor with information about the size and shape of your heart and how well your heart's chambers and valves are working. This procedure takes approximately one hour. There are no restrictions for this procedure. Please do NOT wear cologne, perfume, aftershave, or lotions (deodorant is allowed). Please arrive 15 minutes prior to your appointment time.    Follow-Up: At [facility], you and your health needs are our priority.  As part of our continuing mission to provide you with exceptional heart care, we have created designated Provider Care Teams.  These Care Teams include your primary Cardiologist (physician) and Advanced Practice Providers (APPs -  Physician Assistants and Nurse Practitioners) who all work together to provide you with the care you need, when you need it.  We recommend signing up for the patient portal called "MyChart".  Sign up information is provided on this After Visit Summary.  MyChart is used to connect with patients for Virtual Visits (Telemedicine).  Patients are able to view lab/test results, encounter notes, upcoming appointments, etc.  Non-urgent messages can be sent to your provider as well.   To learn more about what you can do with MyChart, go to [email or web address].    Your next appointment:    1 week post Echo in 3 months  Provider:   Bernadene Person, NP

## 2022-07-31 NOTE — Progress Notes (Signed)
Office Visit    Patient Name: Sierra Perez Date of Encounter: 07/31/2022  Primary Care Provider:  Georgann Housekeeper, MD Primary Cardiologist:  Bryan Lemma, MD  Chief Complaint    62 year old female with a history of pericarditis with pericardial effusion, paroxysmal atrial fibrillation, recurrent DVT on chronic anticoagulation, hypertension, hypothyroidism, type 2 diabetes, breast cancer who presents for hospital follow-up related to pericarditis/pericardial effusion.  Past Medical History    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, her-2 positive, ER/PR negative   Clotting disorder (HCC)    testing was negative, h/o DVT while on treatment   Colon polyps    Complex regional pain syndrome i of right lower limb    RDS   Complication of anesthesia    Cystitis    Diabetes mellitus without complication (HCC)    DVT (deep venous thrombosis) (HCC)    started in 2015, multiple   Family history of breast cancer    Family history of colon cancer    Fibroid    Fibromyalgia    GERD (gastroesophageal reflux disease)    Heart murmur    HLD (hyperlipidemia)    HPV in female    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   Hypertension    Hypothyroidism    IBS (irritable bowel syndrome)    Mitral valve prolapse    Peptic ulcer    Personal history of malignant neoplasm of breast    Sleep apnea treated with continuous positive airway pressure (CPAP)    Past Surgical History:  Procedure Laterality Date   ABDOMINAL HYSTERECTOMY  2010   fibroids, h/o abnormal pap smears   AXILLARY SENTINEL NODE BIOPSY Right 07/08/2020   Procedure: RIGHT AXILLARY SENTINEL NODE BIOPSY;  Surgeon: Emelia Loron, MD;  Location: [facility] OR;  Service: General;  Laterality: Right;   BREAST RECONSTRUCTION WITH PLACEMENT OF TISSUE EXPANDER AND FLEX HD (ACELLULAR HYDRATED DERMIS) Bilateral 07/08/2020    Procedure: BILATERAL BREAST RECONSTRUCTION WITH  FLEX HD (ACELLULAR HYDRATED DERMIS),DIRECT IMPLANT RECONSTRUCTION;  Surgeon: Allena Napoleon, MD;  Location: [facility] OR;  Service: Plastics;  Laterality: Bilateral;   BREAST SURGERY Right 2015   right lumpectomy with reduction and lift   COLONOSCOPY     DENTAL SURGERY     ESOPHAGOGASTRODUODENOSCOPY     LAPAROSCOPIC GASTRIC SLEEVE RESECTION  2014   LAPAROSCOPIC OOPHERECTOMY Bilateral 2016   LEFT HEART CATH AND CORONARY ANGIOGRAPHY N/A 07/18/2022   Procedure: LEFT HEART CATH AND CORONARY ANGIOGRAPHY;  Surgeon: [name], MD;  Location: [facility] INVASIVE CV LAB;  Service: Cardiovascular;  Laterality: N/A;   PERICARDIOCENTESIS N/A 07/20/2022   Procedure: PERICARDIOCENTESIS;  Surgeon: [name], MD;  Location: [facility] INVASIVE CV LAB;  Service: Cardiovascular;  Laterality: N/A;   REPLACEMENT TOTAL KNEE Left 2018   also had 3 prior arthroscopies   TOE SURGERY Right    dislocated toe   TOTAL HIP ARTHROPLASTY Right 11/19/2020   Procedure: RIGHT TOTAL HIP ARTHROPLASTY ANTERIOR APPROACH;  Surgeon: Kathryne Hitch, MD;  Location: [facility] OR;  Service: Orthopedics;  Laterality: Right;   TOTAL KNEE ARTHROPLASTY Right 01/09/2022   Procedure: RIGHT TOTAL KNEE ARTHROPLASTY;  Surgeon: Kathryne Hitch, MD;  Location: [facility] ORS;  Service: Orthopedics;  Laterality: Right;   TOTAL MASTECTOMY Bilateral 07/08/2020   Procedure: BILATERAL TOTAL MASTECTOMY;  Surgeon: Emelia Loron, MD;  Location: [facility] OR;  Service: General;  Laterality: Bilateral;   VENA CAVA FILTER PLACEMENT  2017    Allergies  Allergies  Allergen Reactions   Succinylcholine Other (See Comments)    Muscle aching  18 years ago from 07/02/20   Azithromycin     **DOES NOT WORK** **DOES NOT WORK**    Keflex [Cephalexin] Diarrhea and Nausea And Vomiting   Silver Rash   Sulfa Antibiotics Rash    And swelling generalized not in throat   Sulfasalazine Rash     Labs/Other Studies  Reviewed    The following studies were reviewed today:  Cardiac Studies & Procedures   CARDIAC CATHETERIZATION  CARDIAC CATHETERIZATION 07/20/2022  Narrative   Anticipated discharge date to be determined.   Once Pericardial effusion is stable & Pain has stabilized.   Would hold Eliquis x 1 month minimum  Successful Pericardiocentesis via SubXiphoid approach -- ~ 400 mL of dark bloody fluid. Pigtail Drain sutured in place connected to suction cannister.    RECOMMENDATION Transfer to CVICU for post Pericardiocentesis management. Continue to monitor drain output - pull drain when < 20 mL in 24 hr. Limited Echo ordered for AM pCXR today & in AM. Fluid sent to Lab for Lab studies.   Bryan Lemma, MD   CARDIAC CATHETERIZATION 07/18/2022  Narrative   The left ventricular systolic function is normal.   LV end diastolic pressure is normal.   The left ventricular ejection fraction is 55-65% by visual estimate.   There is no aortic valve stenosis.   Anticipated discharge date to be determined.   Reinitiate DOAC pending decision on high-dose ibuprofen or not.  Defer to rounding team.  Angiographically normal coronary arteries Normal LVEF and LVEDP.   RECOMMENDATIONS: Admit to telemetry floor, will not use IV heparin, will also hold Eliquis pending decision on whether or not to use ibuprofen. Have written for colchicine for possible pericarditis, but will hold off on ibuprofen until we reassess renal function (creatinine was up a little bit today from before) Appears to be somewhat dehydrated, will give her prolonged IV fluid hydration. Check 2D echocardiogram tomorrow to assess for possible pericardial effusion/pericarditis.  Empirically treat for pericarditis as etiology of her ST elevations given relatively recent URI symptoms.    Bryan Lemma, MD  Findings Coronary Findings Diagnostic  Dominance: Right  Left Main Vessel was injected. Vessel is large. Vessel is  angiographically normal.  Left Anterior Descending Vessel was injected. Vessel is large. Vessel is angiographically normal.  First Diagonal Branch Vessel is small in size.  Left Circumflex Vessel was injected. Vessel is normal in caliber and large. Vessel is angiographically normal.  First Obtuse Marginal Branch Vessel is small in size.  Second Obtuse Marginal Branch Vessel is small in size.  First Left Posterolateral Branch Vessel is moderate in size.  Right Coronary Artery Vessel was injected. Vessel is normal in caliber. Vessel is angiographically normal.  Right Posterior Descending Artery Vessel is small in size.  First Right Posterolateral Branch Vessel is small in size.  Intervention  No interventions have been documented.     ECHOCARDIOGRAM  ECHOCARDIOGRAM LIMITED 07/30/2022  Narrative ECHOCARDIOGRAM LIMITED REPORT    Patient Name:   Clara Ambroise   Date of Exam: 07/30/2022 Medical Rec #:  [id number]     Height:       68.0 in Accession #:    [id number]    Weight:       170.0 lb Date of Birth:  1960/12/19  BSA:          1.907 m Patient Age:    61 years      BP:           122/63 mmHg Patient Gender: F             HR:           69 bpm. Exam Location:  [address]  Procedure: 2D Echo, Limited Echo, Limited Color Doppler and Cardiac Doppler  Indications:    I31.3 Pericardial Effusion  History:        Patient has prior history of Echocardiogram examinations, most recent 07/21/2022. Signs/Symptoms:Murmur; Risk Factors:Hypertension, Dyslipidemia and Sleep Apnea.  Sonographer:    Daphine Deutscher RDCS Referring Phys: [id number] DAVID W HARDING  IMPRESSIONS   1. Left ventricular ejection fraction, by estimation, is 60 to 65%. The left ventricle has normal function. The left ventricle has no regional wall motion abnormalities. 2. Right ventricular systolic function is normal. The right ventricular size is normal. Tricuspid regurgitation signal is  inadequate for assessing PA pressure. 3. A small pericardial effusion is present. There is no evidence of cardiac tamponade. 4. The mitral valve is normal in structure. Mild mitral valve regurgitation. 5. Aortic valve regurgitation is mild. 6. The inferior vena cava is normal in size with greater than 50% respiratory variability, suggesting right atrial pressure of 3 mmHg.  FINDINGS Left Ventricle: Left ventricular ejection fraction, by estimation, is 60 to 65%. The left ventricle has normal function. The left ventricle has no regional wall motion abnormalities. The left ventricular internal cavity size was normal in size. There is no left ventricular hypertrophy.  Right Ventricle: The right ventricular size is normal. Right ventricular systolic function is normal. Tricuspid regurgitation signal is inadequate for assessing PA pressure.  Pericardium: A small pericardial effusion is present. There is no evidence of cardiac tamponade.  Mitral Valve: The mitral valve is normal in structure. Mild mitral valve regurgitation.  Tricuspid Valve: Tricuspid valve regurgitation is trivial.  Aortic Valve: Aortic valve regurgitation is mild. Aortic regurgitation PHT measures 410 msec.  Venous: The inferior vena cava is normal in size with greater than 50% respiratory variability, suggesting right atrial pressure of 3 mmHg.  LEFT VENTRICLE PLAX 2D LVIDd:         5.30 cm LVIDs:         3.20 cm LV PW:         0.50 cm LV IVS:        0.40 cm   LEFT ATRIUM         Index LA diam:    4.80 cm 2.52 cm/m AORTIC VALVE AI PHT:      410 msec  AORTA Ao Root diam: 2.80 cm Ao Asc diam:  3.70 cm  MITRAL VALVE MV Area (PHT): 5.13 cm MV Decel Time: 148 msec MR Peak grad: 111.1 mmHg MR Mean grad: 84.0 mmHg MR Vmax:      527.00 cm/s MR Vmean:     440.0 cm/s MV E velocity: 68.10 cm/s MV A velocity: 64.40 cm/s MV E/A ratio:  1.06  [place] signed by Carolan Clines Signature Date/Time:  07/30/2022/7:42:51 PM    Final    MONITORS  LONG TERM MONITOR (3-14 DAYS) 03/02/2021  Narrative Patch Wear Time:  14 days and 0 hours (2023-01-04T15:37:41-0500 to 2023-01-18T15:37:45-0500)  1.Sinus rhythm - avg HR of 81 bpm. 2. Eight runs of SVT occurred, the run with the fastest interval lasting 10 beats with a max  rate of 203 bpm, the longest lasting 20 beats with an avg rate of 115 bpm. 3. Rare PACs and PVCs 4. Patient triggers associated with sinus rhythm or PACs  Arvilla Meres, MD 6:53 PM   CT SCANS  CT CORONARY MORPH W/CTA COR W/SCORE 03/04/2021  Addendum 03/04/2021  1:16 PM ADDENDUM REPORT: 03/04/2021 13:14  CLINICAL DATA:  This is a 62 year old female with anginal symptoms.  EXAM: Cardiac/Coronary  CTA  TECHNIQUE: The patient was scanned on a [employer].  FINDINGS: A 100 kV prospective scan was triggered in the descending thoracic aorta at 111 HU's. Axial non-contrast 3 mm slices were carried out through the heart. The data set was analyzed on a dedicated work station and scored using the Agatson method. Gantry rotation speed was 250 msecs and collimation was .6 mm. No beta blockade and 0.8 mg of sl NTG was given. The 3D data set was reconstructed in 5% intervals of the 67-82 % of the R-R cycle. Diastolic phases were analyzed on a dedicated work station using MPR, MIP and VRT modes. The patient received 80 cc of contrast.  Aorta: Normal size.  No calcifications.  No dissection.  Aortic Valve:  Trileaflet.  No calcifications.  Coronary Arteries:  Normal coronary origin.  Right dominance.  RCA is a large dominant artery that gives rise to PDA and PLA. There is no plaque.  Left main is a large artery that gives rise to LAD and LCX arteries.  LAD is a large vessel. There is a Mild (25-49%) focal calcification in the proximal portion of the LAD. The mid and distal LAD with no plaques.  LCX is a non-dominant artery that gives rise to one  large OM1 branch. There is no plaque.  Coronary Calcium Score:  Left main: 0  Left anterior descending artery: 26  Left circumflex artery: 0  Right coronary artery: 0  Total: 26  Percentile: 78  Other findings:  Normal pulmonary vein drainage into the left atrium.  Normal left atrial appendage without a thrombus.  Normal size of the pulmonary artery.  IMPRESSION: 1. Coronary calcium score of 26. This was 57 percentile for age and sex matched control.  2. Normal coronary origin with right dominance.  3. Mild Coronary artery disease. CAD-RADS 2. Mild non-obstructive CAD (25-49%). Consider non-atherosclerotic causes of chest pain. Consider preventive therapy and risk factor modification.   Electronically Signed By: Thomasene Ripple D.O. On: 03/04/2021 13:14  Narrative EXAM: OVER-READ INTERPRETATION  CT CHEST  The following report is an over-read performed by radiologist Dr. Signa Kell of [facility] on 03/04/2021. This over-read does not include interpretation of cardiac or coronary anatomy or pathology. The coronary calcium score/coronary CTA interpretation by the cardiologist is attached.  COMPARISON:  None.  FINDINGS: Mediastinum/Nodes: Small to moderate size hiatal hernia. No mediastinal mass or adenopathy identified.  Lungs/Pleura: No pleural effusion. Small nodule within the anterolateral right lower lobe measures 3 mm, image 28/11. No airspace consolidation, atelectasis or pneumothorax.  Upper Abdomen: No acute findings. Postop change from previous gastric sleeve.  Musculoskeletal: No chest wall mass or suspicious bone lesions identified.  IMPRESSION: 1. Small to moderate size hiatal hernia. 2. Small nodule within the anterolateral right lower lobe measures 3 mm. No follow-up needed if patient is low-risk. Non-contrast chest CT can be considered in 12 months if patient is high-risk. This recommendation follows the consensus  statement: Guidelines for Management of Incidental Pulmonary Nodules Detected on CT Images: From the Fleischner  Society 2017; Radiology 2017; [phone].  Electronically Signed: By: Signa Kell M.D. On: 03/04/2021 09:41         Recent Labs: 07/21/2022: ALT 81; TSH 1.966 07/22/2022: BUN 14; Creatinine, Ser 0.63; Hemoglobin 9.4; Magnesium 1.9; Platelets 193; Potassium 3.6; Sodium 138  Recent Lipid Panel    Component Value Date/Time   CHOL 110 07/18/2022 1527   TRIG 50 07/18/2022 1527   HDL 63 07/18/2022 1527   CHOLHDL 1.7 07/18/2022 1527   VLDL 10 07/18/2022 1527   LDLCALC 37 07/18/2022 1527    History of Present Illness    62 year old female with with the above past medical history including pericarditis with pericardial effusion, paroxysmal atrial fibrillation, recurrent DVT on chronic anticoagulation, hypertension, hypothyroidism, type 2 diabetes, breast cancer.  Cardiac monitor in January 2023 showed predominantly sinus rhythm, 8 runs of SVT, rare PACs and PVCs.  Coronary CT angiogram in 02/2021 revealed coronary calcium score of 26 (78th percentile), minimal CAD.  Echocardiogram at that time showed EF 55 to 60%, normal LV function, no RWMA, normal RV, no significant valvular abnormalities.  She was last seen in the office by Dr. Gala Romney on 05/27/2021 and was doing well from a cardiac standpoint.  She was referred to the pharmacy team for consideration of GLP-1 receptor agonist in the setting of type 2 diabetes and obesity.  She was seen in the ED [facility] on 07/16/2022 in the setting of chest pain.  Troponin was negative.  She was discharged home.  She returned to the ED on 07/18/2022 in the setting of chest pain, EKG concerning for inferior STEMI versus pericarditis.  She was fatigued, nauseated, and diaphoretic.  She underwent cardiac catheterization which revealed normal coronary arteries.  Echocardiogram post cath showed large pericardial effusion with physiology suggestive of  possible cardiac tamponade.  She underwent therapeutic/diagnostic pericardiocentesis/pericardial drain on 07/20/2022.  Fluid was sent for pathology given concern for recurrent breast cancer.  She was treated with colchicine and ibuprofen.  She developed A-fib with RVR 07/23/2022, new diagnosis.  She was treated with IV amiodarone.  This was later transitioned to amiodarone 200 mg daily.  She remained on OAC.  30-day monitor was ordered.  It was noted that should she have no further atrial fibrillation, could DC amiodarone in the future.  She was discharged home in stable condition on 07/22/2022.  Repeat limited echocardiogram on 07/30/2022 showed EF 60 to 65%, normal LV function, no RWMA, normal RV, small pericardial effusion, no evidence of tamponade, mild mitral valve regurgitation, mild aortic valve regurgitation.  She presents today for follow-up.  Since her hospitalization she has done well from a cardiac standpoint.  She is back to work and seems to be tolerating this well.  She does note occasional lightheadedness when bending down, BP has been stable.  She denies any chest pain, dyspnea, palpitations, edema, PND, orthopnea, weight gain.  She is eager to increase her activity.  She continues to have some nausea and diarrhea since starting colchicine.  Otherwise, she reports feeling well.  Home Medications    Current Outpatient Medications  Medication Sig Dispense Refill   albuterol (VENTOLIN HFA) 108 (90 Base) MCG/ACT inhaler Inhale 1 puff into the lungs every 6 (six) hours as needed for wheezing or shortness of breath.     amiodarone (PACERONE) 200 MG tablet Take 1 tablet (200 mg total) by mouth daily. 90 tablet 3   amLODipine (NORVASC) 5 MG tablet Take 5 mg by mouth daily.     anastrozole (  ARIMIDEX) 1 MG tablet TAKE 1 TABLET(1 MG) BY MOUTH DAILY (Patient taking differently: Take 1 mg by mouth daily.) 90 tablet 3   apixaban (ELIQUIS) 5 MG TABS tablet Take 1 tablet (5 mg total) by mouth 2 (two) times  daily. Resume 07/22/22 PM dose. 180 tablet 2   buPROPion (WELLBUTRIN XL) 150 MG 24 hr tablet Take 150 mg by mouth daily.     CALCIUM CITRATE PO Take 3 tablets by mouth daily.     Cholecalciferol (VITAMIN D-3) 25 MCG (1000 UT) CAPS Take 1,000 Units by mouth daily.     colchicine 0.6 MG tablet Take 1 tablet (0.6 mg total) by mouth 2 (two) times daily. (Patient taking differently: Take 0.6 mg by mouth daily.) 180 tablet 0   DULoxetine (CYMBALTA) 60 MG capsule Take 60 mg by mouth daily.     fluticasone (FLONASE) 50 MCG/ACT nasal spray Place 2 sprays into both nostrils daily.     gabapentin (NEURONTIN) 600 MG tablet TAKE 1 TABLET(600 MG) BY MOUTH THREE TIMES DAILY (Patient taking differently: Take 600 mg by mouth 2 (two) times daily.) 90 tablet 0   ibuprofen (ADVIL) 400 MG tablet Take 1 tablet (400 mg total) by mouth 3 (three) times daily for 14 days. 42 tablet 0   ipratropium (ATROVENT) 0.03 % nasal spray Place 2 sprays into both nostrils 2 (two) times daily.     Lactobacillus Rhamnosus, GG, (CULTURELLE PO) Take 1 capsule by mouth daily.     levothyroxine (SYNTHROID) 50 MCG tablet Take 50 mcg by mouth every morning.     pantoprazole (PROTONIX) 40 MG tablet Take 1 tablet (40 mg total) by mouth 2 (two) times daily. 180 tablet 3   rosuvastatin (CRESTOR) 5 MG tablet Take 1 tablet (5 mg total) by mouth daily. 90 tablet 3   tirzepatide (MOUNJARO) 12.5 MG/0.5ML Pen Inject 12.5 mg into the skin once a week. 2 mL 0   valACYclovir (VALTREX) 500 MG tablet Take 1 tablet (500 mg total) by mouth daily. Increase to bid x 3 days with symptoms (Patient taking differently: Take 500 mg by mouth daily as needed (outbreaks). Increase to bid x 3 days with symptoms) 120 tablet 4   acetaminophen (TYLENOL) 500 MG tablet Take 1,000 mg by mouth every 6 (six) hours as needed for moderate pain. (Patient not taking: Reported on 07/31/2022)     Bacillus Coagulans-Inulin (PROBIOTIC) 1-250 BILLION-MG CAPS Take 250 mg by mouth daily.  (Patient not taking: Reported on 07/31/2022)     cyclobenzaprine (FLEXERIL) 10 MG tablet Take 1 tablet (10 mg total) by mouth at bedtime. (Patient not taking: Reported on 07/31/2022) 30 tablet 0   lidocaine (LIDODERM) 5 % Place 1 patch onto the skin daily. Remove & Discard patch within 12 hours or as directed by MD (Patient not taking: Reported on 07/31/2022) 30 patch 0   Current Facility-Administered Medications  Medication Dose Route Frequency Provider Last Rate Last Admin   triamcinolone acetonide (KENALOG) 10 MG/ML injection 10 mg  10 mg Other Once Delories Heinz, DPM         Review of Systems    She denies chest pain, palpitations, dyspnea, pnd, orthopnea, n, v, dizziness, syncope, edema, weight gain, or early satiety. All other systems reviewed and are otherwise negative except as noted above.   Physical Exam    VS:  BP 112/80 (BP Location: Left Arm, Patient Position: Sitting, Cuff Size: Normal)   Pulse 61   Ht 5\' 8"  (1.727 m)  Wt 169 lb (76.7 kg)   SpO2 99%   BMI 25.70 kg/m  GEN: Well nourished, well developed, in no acute distress. HEENT: normal. Neck: Supple, no JVD, carotid bruits, or masses. Cardiac: RRR, no murmurs, rubs, or gallops. No clubbing, cyanosis, edema.  Radials/DP/PT 2+ and equal bilaterally.  Left radial cath site without bruising, bleeding, or hematoma.  Pericardial drain incision site clean, dry, and intact. Respiratory:  Respirations regular and unlabored, clear to auscultation bilaterally. GI: Soft, nontender, nondistended, BS + x 4. MS: no deformity or atrophy. Skin: warm and dry, no rash. Neuro:  Strength and sensation are intact. Psych: Normal affect.  Accessory Clinical Findings    ECG personally reviewed by me today - EKG Interpretation Date/Time:  Friday July 31 2022 09:03:39 EDT Ventricular Rate:  61 PR Interval:  150 QRS Duration:  90 QT Interval:  414 QTC Calculation: 416 R Axis:   63  Text Interpretation: Normal sinus rhythm  Nonspecific T wave abnormality When compared with ECG of 21-Jul-2022 07:28, Sinus rhythm has replaced Atrial fibrillation Vent. rate has decreased BY  59 BPM ST no longer elevated in Lateral leads Nonspecific T wave abnormality now evident in Inferior leads Nonspecific T wave abnormality now evident in Anterior leads Confirmed by Bernadene Person ([id number]) on 07/31/2022 9:19:46 AM  - no acute changes.   Lab Results  Component Value Date   WBC 4.7 07/22/2022   HGB 9.4 (L) 07/22/2022   HCT 28.5 (L) 07/22/2022   MCV 91.9 07/22/2022   PLT 193 07/22/2022   Lab Results  Component Value Date   CREATININE 0.63 07/22/2022   BUN 14 07/22/2022   NA 138 07/22/2022   K 3.6 07/22/2022   CL 112 (H) 07/22/2022   CO2 21 (L) 07/22/2022   Lab Results  Component Value Date   ALT 81 (H) 07/21/2022   AST 49 (H) 07/21/2022   ALKPHOS 47 07/21/2022   BILITOT 0.6 07/21/2022   Lab Results  Component Value Date   CHOL 110 07/18/2022   HDL 63 07/18/2022   LDLCALC 37 07/18/2022   TRIG 50 07/18/2022   CHOLHDL 1.7 07/18/2022    Lab Results  Component Value Date   HGBA1C 4.6 (L) 07/18/2022    Assessment & Plan    1. Pericarditis/pericardial effusion: S/p therapeutic/diagnostic pericardiocentesis/ pericardial drain on 07/20/2022.  Cath prior to pericardiocentesis revealed normal coronary arteries.  Repeat limited echocardiogram on 07/30/2022 showed EF 60 to 65%, normal LV function, no RWMA, normal RV, small pericardial effusion, no evidence of tamponade, mild mitral valve regurgitation, mild aortic valve regurgitation.  She denies any chest pain, dyspnea.  Pericardial drain incision site is clean, dry, intact.  She is back to work and other than noting occasional lightheadedness when bending down, she reports feeling well.  She is eager to increase her activity.  I advised her to avoid any strenuous activity for the time being. She has noted some nausea/diarrhea since starting colchicine.  Will decrease colchicine to  0.6 mg daily, otherwise plan to continue for 3 months.  Will confirm with Dr. [name] discontinuation of ibuprofen following 2 weeks of therapy given chronic DOAC therapy.  Will plan for limited echo in 3 months.  Discussed ED precautions.  Continue colchicine, ibuprofen as above.  ADDENDUM 08/03/2022: Called patient to discuss Dr. Elissa Hefty recommendations to discontinue ibuprofen after 2 weeks.  Patient notes she has had increased dizziness since her office visit. She denies any palpitations, presyncope, syncope.  Cardiac monitor is pending.  Encouraged  adequate hydration, ongoing monitoring of BP/HR.  Discussed ED precautions. Advised her to notify us of worsening symptoms.  2. Paroxysmal atrial fibrillation: S/p TEE/DCCV.  Maintaining sinus rhythm.  30-day monitor ordered and pending.  If no further atrial fibrillation, consider discontinuing amiodarone in the future.  Continue Eliquis.  3. Valvular heart disease: Most recent echo showed mild mitral valve regurgitation, mild aortic valve regurgitation.  Asymptomatic.  Consider repeat echo as clinically indicated.  4.  Recurrent DVT: On chronic anticoagulation.  5. Hypertension: BP well controlled. Continue current antihypertensive regimen.   6. Hypothyroidism: TSH was 1.966 in 07/2022. On levothyroxine. Managed per PCP.    7. Type 2 diabetes: A1c was 4.6 in 07/2022.  Well-controlled.  Managed per PCP.  8. History of breast cancer: Follows with oncology.  9. Disposition: Follow-up in 3 months.      Joylene Grapes, NP 07/31/2022, 10:54 AM

## 2022-08-01 ENCOUNTER — Other Ambulatory Visit: Payer: Self-pay | Admitting: Internal Medicine

## 2022-08-03 ENCOUNTER — Encounter: Payer: Self-pay | Admitting: Orthopaedic Surgery

## 2022-08-04 ENCOUNTER — Telehealth: Payer: Self-pay | Admitting: Pharmacist

## 2022-08-04 DIAGNOSIS — E119 Type 2 diabetes mellitus without complications: Secondary | ICD-10-CM

## 2022-08-04 MED ORDER — MOUNJARO 12.5 MG/0.5ML ~~LOC~~ SOAJ
12.5000 mg | SUBCUTANEOUS | 0 refills | Status: DC
Start: 2022-08-04 — End: 2023-03-19

## 2022-08-04 NOTE — Telephone Encounter (Signed)
Patient called, requesting refill on Mounjaro. States her A1c has improved and she has lost almost 50lbs since she started the therapy. No side effect on 12.5 mg weekly dose but she would like to stay on 12.5 mg for another 4 weeks.

## 2022-08-04 NOTE — Telephone Encounter (Signed)
Excellent

## 2022-08-11 ENCOUNTER — Telehealth: Payer: Self-pay

## 2022-08-11 NOTE — Telephone Encounter (Signed)
Pt called and reports she scraped her leg on a piece of wood. She reports a couple of layers came off and it has been bleeding off and on. Encouraged pt to use antibacterial soap and water to clean the area, apply antibacterial ointment and cover, compress, elevate, ice and rest it. She knows to call should the area become inflamed or she notices purulent drainage.

## 2022-08-12 ENCOUNTER — Telehealth: Payer: Self-pay | Admitting: Plastic Surgery

## 2022-08-12 NOTE — Telephone Encounter (Signed)
Patient called because she originally had breast surgery with Dr Arita Miss and some issues came from that and she saw Dr Ulice Bold back on 03/13/22 and was told to follow up if any issues. She said in the last couple of weeks the whole right breast has been hard and she noticed bruising today. I set her up with a follow up with Dr Ulice Bold but wasn't sure if she needs to be seen sooner. Please advise. Call back is [phone]

## 2022-08-17 ENCOUNTER — Other Ambulatory Visit (INDEPENDENT_AMBULATORY_CARE_PROVIDER_SITE_OTHER): Payer: 59

## 2022-08-17 ENCOUNTER — Ambulatory Visit (INDEPENDENT_AMBULATORY_CARE_PROVIDER_SITE_OTHER): Payer: 59 | Admitting: Physician Assistant

## 2022-08-17 ENCOUNTER — Encounter: Payer: Self-pay | Admitting: Physician Assistant

## 2022-08-17 ENCOUNTER — Ambulatory Visit: Payer: 59

## 2022-08-17 DIAGNOSIS — Z96651 Presence of right artificial knee joint: Secondary | ICD-10-CM | POA: Diagnosis not present

## 2022-08-17 NOTE — Progress Notes (Signed)
Office Visit Note   Patient: Sierra Perez           Date of Birth: 1960-09-05           MRN: [id number] Visit Date: 08/17/2022              Requested by: Georgann Housekeeper, MD [address] [place],  [place] [address] PCP: Georgann Housekeeper, MD   Assessment & Plan: Visit Diagnoses:  1. Status post total right knee replacement     Plan: Continue to work on range of motion and strengthening.  Will see her back at 1 year postop and obtain an AP and lateral view of her right knee.  Will see her sooner if there is any questions concerns.  Follow-Up Instructions: No follow-ups on file.   Orders:  Orders Placed This Encounter  Procedures   XR Knee 1-2 Views Right   No orders of the defined types were placed in this encounter.     Procedures: No procedures performed   Clinical Data: No additional findings.   Subjective: Chief Complaint  Patient presents with   Right Knee - Routine Post Op    HPI Sierra Perez comes in today for follow-up of her right total knee arthroplasty 01/09/2022.  She unfortunately was hospitalized due to pericarditis, pericardial effusion and new onset of A-fib.  She is now on Eliquis.  She has been told not to lift any more than 10 pounds.  She feels knee is stiff at times.  Ranks her pain to be 4-5 out of 10 pain at its worst.  Taking nothing for pain.  No known injury.  Review of Systems See HPI otherwise negative  Objective: Vital Signs: There were no vitals taken for this visit.  Physical Exam General: Well-developed well-nourished female no acute distress.  Affect appropriate Ortho Exam Right knee full extension full flexion.  No instability valgus varus stressing.  Surgical incisions well-healed.  There is no effusion abnormal warmth or erythema about the knee. Specialty Comments:  No specialty comments available.  Imaging: XR Knee 1-2 Views Right  Result Date: 08/17/2022 Right knee 2 views: No fracture.  Components  well-seated.  Knee is well located.  No bony lesions.    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/19/2020   Breast cancer (HCC) 07/08/2020   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   H/O total hysterectomy 06/11/2020   Genetic testing 05/27/2020   Chronic superficial gastritis 05/27/2020   Fibromyalgia 05/27/2020   GERD (gastroesophageal reflux disease) 05/27/2020   Prediabetes 05/27/2020   Complex regional pain syndrome I, unspecified 05/27/2020   Atypical lobular hyperplasia [facility]) of left breast 05/23/2020   History of therapeutic radiation 05/23/2020   Family history of breast cancer    Family history of colon cancer    Personal history of malignant neoplasm of breast    Unilateral primary osteoarthritis, right knee 02/26/2020   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   DVT (deep venous thrombosis) (HCC) 05/04/2018   Malignant neoplasm of upper-outer quadrant of right breast in female, estrogen  receptor negative (HCC) 05/03/2018   Obstructive sleep apnea (adult) (pediatric) 09/14/2017   Adenopathy 07/15/2017   B12 deficiency 07/15/2017   Disorder of tympanic membrane of right ear 07/15/2017   Recurrent UTI 03/09/2017   Bladder pain 02/25/2017   Proteinuria 02/25/2017   Hematoma of thigh, left, subsequent encounter 08/16/2016   At risk for obstructive sleep apnea 07/01/2016   Suspected sleep apnea 07/01/2016   Arthritis of left knee 05/26/2016   Localized primary osteoarthritis of lower leg, left 05/26/2016   Clotting disorder (HCC) 05/22/2016   Chronic  anticoagulation 01/06/2016   Right-sided epistaxis 01/06/2016   Urinary tract infection, site not specified 08/12/2015   Abnormal Pap smear of vagina 05/06/2015   Follow-up examination, following other surgery 12/14/2013   Pelvic and perineal pain 11/28/2013   Anxiety and depression 11/16/2013   H/O bariatric surgery 11/16/2013   Atrophic vaginitis 11/13/2013   Tear of lateral cartilage or meniscus of knee, current 04/14/2013   Allergic rhinitis 01/16/2013   Female genital symptoms 11/10/2012   Symptomatic menopausal or female climacteric states 11/10/2012   Disequilibrium 06/09/2012   Morbid obesity (HCC) 02/09/2012   Vitamin D deficiency 09/13/2008   Preop examination 04/20/2008   Hypothyroidism 05/20/2007   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, her-2 positive, ER/PR negative   Clotting disorder (HCC)    testing was negative, h/o DVT while on treatment   Colon polyps    Complex regional pain syndrome i of right lower limb    RDS   Complication of anesthesia    Cystitis    Diabetes mellitus without complication (HCC)    DVT (deep venous thrombosis) (HCC)    started in 2015, multiple   Family history of breast cancer    Family history of colon cancer    Fibroid    Fibromyalgia    GERD (gastroesophageal reflux disease)    Heart murmur    HLD (hyperlipidemia)    HPV in female    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   Hypertension    Hypothyroidism    IBS (irritable bowel syndrome)    Mitral valve prolapse    Peptic ulcer    Personal history of malignant neoplasm of breast    Sleep apnea treated with continuous positive airway pressure (CPAP)     Family History  Problem Relation Age of Onset   Hypertension Mother    Skin cancer Mother    Colon polyps Mother    Valvular heart disease Mother    Kidney disease Mother    Diabetes Father    Kidney  disease Father    Dementia Father    Factor VIII deficiency Father    Colon polyps Brother    Bladder Cancer Maternal Grandmother        dx in late 1s; smoker   Stroke Maternal Grandmother    Diabetes Maternal Grandmother    Lung cancer Maternal Grandfather    Colon cancer Maternal Grandfather 45       origin   Thyroid cancer Maternal Grandfather        dx in his 75s   Breast cancer Paternal Grandmother        dx in 76s   Prostate cancer Other        MGMs brother    Past Surgical History:  Procedure  Laterality Date   ABDOMINAL HYSTERECTOMY  2010   fibroids, h/o abnormal pap smears   AXILLARY SENTINEL NODE BIOPSY Right 07/08/2020   Procedure: RIGHT AXILLARY SENTINEL NODE BIOPSY;  Surgeon: Emelia Loron, MD;  Location: [facility] OR;  Service: General;  Laterality: Right;   BREAST RECONSTRUCTION WITH PLACEMENT OF TISSUE EXPANDER AND FLEX HD (ACELLULAR HYDRATED DERMIS) Bilateral 07/08/2020   Procedure: BILATERAL BREAST RECONSTRUCTION WITH  FLEX HD (ACELLULAR HYDRATED DERMIS),DIRECT IMPLANT RECONSTRUCTION;  Surgeon: Allena Napoleon, MD;  Location: [facility] OR;  Service: Plastics;  Laterality: Bilateral;   BREAST SURGERY Right 2015   right lumpectomy with reduction and lift   COLONOSCOPY     DENTAL SURGERY     ESOPHAGOGASTRODUODENOSCOPY     LAPAROSCOPIC GASTRIC SLEEVE RESECTION  2014   LAPAROSCOPIC OOPHERECTOMY Bilateral 2016   LEFT HEART CATH AND CORONARY ANGIOGRAPHY N/A 07/18/2022   Procedure: LEFT HEART CATH AND CORONARY ANGIOGRAPHY;  Surgeon: [name], MD;  Location: [facility] INVASIVE CV LAB;  Service: Cardiovascular;  Laterality: N/A;   PERICARDIOCENTESIS N/A 07/20/2022   Procedure: PERICARDIOCENTESIS;  Surgeon: [name], MD;  Location: [facility] INVASIVE CV LAB;  Service: Cardiovascular;  Laterality: N/A;   REPLACEMENT TOTAL KNEE Left 2018   also had 3 prior arthroscopies   TOE SURGERY Right    dislocated toe   TOTAL HIP ARTHROPLASTY Right 11/19/2020   Procedure: RIGHT TOTAL HIP  ARTHROPLASTY ANTERIOR APPROACH;  Surgeon: Kathryne Hitch, MD;  Location: [facility] OR;  Service: Orthopedics;  Laterality: Right;   TOTAL KNEE ARTHROPLASTY Right 01/09/2022   Procedure: RIGHT TOTAL KNEE ARTHROPLASTY;  Surgeon: Kathryne Hitch, MD;  Location: [facility] ORS;  Service: Orthopedics;  Laterality: Right;   TOTAL MASTECTOMY Bilateral 07/08/2020   Procedure: BILATERAL TOTAL MASTECTOMY;  Surgeon: Emelia Loron, MD;  Location: [facility] OR;  Service: General;  Laterality: Bilateral;   VENA CAVA FILTER PLACEMENT  2017   Social History   Occupational History   Occupation: [employer]  Tobacco Use   Smoking status: Never   Smokeless tobacco: Never  Vaping Use   Vaping status: Never Used  Substance and Sexual Activity   Alcohol use: Yes    Comment: rare   Drug use: Not Currently    Types: Marijuana   Sexual activity: Not Currently    Birth control/protection: Surgical    Comment: Hysterectomy

## 2022-08-18 ENCOUNTER — Encounter: Payer: Self-pay | Admitting: Plastic Surgery

## 2022-08-18 ENCOUNTER — Ambulatory Visit (INDEPENDENT_AMBULATORY_CARE_PROVIDER_SITE_OTHER): Payer: 59 | Admitting: Plastic Surgery

## 2022-08-18 VITALS — BP 129/81 | HR 76 | Ht 68.0 in | Wt 164.0 lb

## 2022-08-18 DIAGNOSIS — Z171 Estrogen receptor negative status [ER-]: Secondary | ICD-10-CM

## 2022-08-18 DIAGNOSIS — Z9013 Acquired absence of bilateral breasts and nipples: Secondary | ICD-10-CM | POA: Diagnosis not present

## 2022-08-18 DIAGNOSIS — C50411 Malignant neoplasm of upper-outer quadrant of right female breast: Secondary | ICD-10-CM | POA: Diagnosis not present

## 2022-08-18 DIAGNOSIS — Z923 Personal history of irradiation: Secondary | ICD-10-CM

## 2022-08-20 ENCOUNTER — Ambulatory Visit (HOSPITAL_BASED_OUTPATIENT_CLINIC_OR_DEPARTMENT_OTHER): Payer: 59 | Admitting: Obstetrics & Gynecology

## 2022-08-21 ENCOUNTER — Other Ambulatory Visit (HOSPITAL_BASED_OUTPATIENT_CLINIC_OR_DEPARTMENT_OTHER): Payer: Self-pay

## 2022-08-24 DIAGNOSIS — Z9010 Acquired absence of unspecified breast and nipple: Secondary | ICD-10-CM | POA: Insufficient documentation

## 2022-08-24 NOTE — Progress Notes (Signed)
   Subjective:    Patient ID: Sierra Perez, female    DOB: [birth year], 62 y.o.   MRN: [id number]  The patient is a 62 y.o. female here for evaluation of her right breast.  She underwent bilateral mastectomies and reconstruction. On June 2022 she had direct to implant breast reconstruction with Mentor smooth round moderate pule profile extra implants with 440 cc.  The left implant is ptotic and the right is very tight.  She was radiated on the right breast. Over the past few days she noticed discoloration of the skin over the implant.  It looks bruised.  She is not aware of trauma but acknowledges that she does not have full feeling. This is very concerning for radiation damage and potential loss of skin.      Review of Systems  Constitutional: Negative.   HENT: Negative.    Eyes: Negative.   Respiratory: Negative.    Cardiovascular: Negative.   Gastrointestinal: Negative.   Endocrine: Negative.   Genitourinary: Negative.   Musculoskeletal: Negative.   Skin:  Positive for color change.       Objective:   Physical Exam Nursing note reviewed.  Constitutional:      Appearance: Normal appearance.  HENT:     Head: Atraumatic.  Cardiovascular:     Rate and Rhythm: Normal rate.     Pulses: Normal pulses.  Pulmonary:     Effort: Pulmonary effort is normal.  Abdominal:     Palpations: Abdomen is soft.  Musculoskeletal:        General: No swelling or deformity.  Skin:    General: Skin is warm.     Capillary Refill: Capillary refill takes less than 2 seconds.     Coloration: Skin is not pale.     Findings: Bruising present. No lesion.  Neurological:     Mental Status: She is alert and oriented to person, place, and time.  Psychiatric:        Mood and Affect: Mood normal.        Behavior: Behavior normal.        Thought Content: Thought content normal.        Assessment & Plan:     ICD-10-CM   1. Malignant neoplasm of upper-outer quadrant of right breast in female,  estrogen receptor negative (HCC)  C50.411    Z17.1     2. 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  I connected with  Lateshia Cuellar on 09/07/22 by phone and verified that I am speaking with the correct person using two identifiers. Patient in [place] and I was at the office.  We spent 5 min in discussion.   I discussed the limitations of evaluation and management by telemedicine. The patient expressed understanding and agreed to proceed.

## 2022-08-26 ENCOUNTER — Encounter: Payer: Self-pay | Admitting: Plastic Surgery

## 2022-09-01 ENCOUNTER — Ambulatory Visit: Payer: 59 | Attending: Cardiology

## 2022-09-01 DIAGNOSIS — I4891 Unspecified atrial fibrillation: Secondary | ICD-10-CM

## 2022-09-02 ENCOUNTER — Telehealth: Payer: Self-pay | Admitting: *Deleted

## 2022-09-02 ENCOUNTER — Ambulatory Visit (INDEPENDENT_AMBULATORY_CARE_PROVIDER_SITE_OTHER): Payer: 59 | Admitting: Plastic Surgery

## 2022-09-02 ENCOUNTER — Other Ambulatory Visit (HOSPITAL_BASED_OUTPATIENT_CLINIC_OR_DEPARTMENT_OTHER): Payer: Self-pay | Admitting: *Deleted

## 2022-09-02 DIAGNOSIS — C50411 Malignant neoplasm of upper-outer quadrant of right female breast: Secondary | ICD-10-CM

## 2022-09-02 DIAGNOSIS — Z171 Estrogen receptor negative status [ER-]: Secondary | ICD-10-CM | POA: Diagnosis not present

## 2022-09-02 DIAGNOSIS — Z9013 Acquired absence of bilateral breasts and nipples: Secondary | ICD-10-CM | POA: Diagnosis not present

## 2022-09-02 MED ORDER — VALACYCLOVIR HCL 500 MG PO TABS: 500.0000 mg | ORAL_TABLET | Freq: Every day | ORAL | 0 refills | Status: DC

## 2022-09-02 NOTE — Progress Notes (Signed)
   Subjective:    Patient ID: Sierra Perez, female    DOB: [birth year], 62 y.o.   MRN: [id number]  The patient is a 62 year old female known to me for phone call regarding her breast reconstruction.  She had bilateral mastectomies with reconstruction and direct to implant placement with a 440 cc Mentor smooth round moderate plus profile implant.  She has some ptosis of the left implant.  The right side is very tight and was radiated.  She noticed some discoloration over the breast a few weeks ago and was concerned about the potential loss of skin over the implant.  The tightness has not improved the color has changed a little bit but still has a yellow bruised color to it.  It still very uncomfortable.  She still has to watch her positioning so that she does not put pressure on it.      Review of Systems  Constitutional: Negative.   Eyes: Negative.   Respiratory: Negative.    Cardiovascular: Negative.   Gastrointestinal: Negative.   Endocrine: Negative.   Genitourinary: Negative.   Musculoskeletal: Negative.        Objective:   Physical Exam     Assessment & Plan:     ICD-10-CM   1. Malignant neoplasm of upper-outer quadrant of right breast in female, estrogen receptor negative (HCC)  C50.411    Z17.1     2. 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  The patient was at work and I was at the office.  We spent 20 minutes in discussion.  The patient would like to move forward with removal of the implant of the right breast.   I discussed the limitations of evaluation and management by telemedicine. The patient expressed understanding and agreed to proceed.

## 2022-09-02 NOTE — Telephone Encounter (Signed)
Request for surgical clearance and recommendations for holding Eliquis faxed to Dr. Bryan Lemma with fax confirmation received. Fax # [phone]

## 2022-09-02 NOTE — Telephone Encounter (Signed)
Pharmacy please advise on holding Eliquis prior to removal of right breast implant scheduled for TBD. Thank you.

## 2022-09-02 NOTE — Telephone Encounter (Signed)
   Pre-operative Risk Assessment    Patient Name: Sierra Perez  DOB: November 16, 1960 MRN: [id number]      Request for Surgical Clearance    Procedure:  REMOVAL OF RIGHT BREAST IMPLANT  Date of Surgery:  Clearance TBD                                 Surgeon:  DR. Foster Simpson Surgeon's Group or Practice Name:  [facility] Phone number:  [phone] Fax number:  [phone]   Type of Clearance Requested:   - Medical  - Pharmacy:  Hold Apixaban (Eliquis)     Type of Anesthesia:  General    Additional requests/questions:    Elpidio Anis   09/02/2022, 4:57 PM

## 2022-09-02 NOTE — Progress Notes (Signed)
Pt requests refill on valtrex. Has appt for annual exam in August. Refill sent

## 2022-09-03 ENCOUNTER — Other Ambulatory Visit (HOSPITAL_BASED_OUTPATIENT_CLINIC_OR_DEPARTMENT_OTHER): Payer: Self-pay | Admitting: *Deleted

## 2022-09-03 MED ORDER — VALACYCLOVIR HCL 500 MG PO TABS: 500.0000 mg | ORAL_TABLET | Freq: Every day | ORAL | 0 refills | Status: DC

## 2022-09-03 NOTE — Telephone Encounter (Signed)
Patient with diagnosis of atrial fibrillation and recurrent DVT on Eliquis for anticoagulation.    Procedure:  REMOVAL OF RIGHT BREAST IMPLANT   Date of Surgery:  Clearance TBD     CHA2DS2-VASc Score = 2   This indicates a 2.2% annual risk of stroke. The patient's score is based upon: CHF History: 0 HTN History: 0 Diabetes History: 1 Stroke History: 0 Vascular Disease History: 0 Age Score: 0 Gender Score: 1      CrCl 110 Platelet count 193  It appears that she has been bridged in the past for procedures, this determined by MD following her for recurrent DVT.   Based on that, would suggest 1 day hold on Eliquis for upcoming procedure.  If surgeon needs > 1 day, would need to bridge.     **This guidance is not considered finalized until pre-operative APP has relayed final recommendations.**

## 2022-09-04 ENCOUNTER — Telehealth: Payer: Self-pay

## 2022-09-04 NOTE — Progress Notes (Signed)
Reviewed chart with Dr. Arby Barrette at [facility]. Pending patients cardiac clearance patient would be a day surgery center candidate. LVM with Heather at Dr. Kittie Plater office to request cards clearance.

## 2022-09-04 NOTE — Telephone Encounter (Signed)
Pre-op Team,   Will you please see if patient can be contacted today to reschedule echo? She needs to have her surgery before the end of August and will need an echo first. Please let me know if/when we can get it rescheduled.   Thank you so much for your help!  DW

## 2022-09-04 NOTE — Telephone Encounter (Signed)
Pt is scheduled for Echo on 8/20 and tele on 8/21. Med rec and consent done. She is trying to schedule the surgery around 8/22.      Patient Consent for Virtual Visit        Chevie Pennix has provided verbal consent on 09/04/2022 for a virtual visit (video or telephone).   CONSENT FOR VIRTUAL VISIT FOR:  Sierra Perez  By participating in this virtual visit I agree to the following:  I hereby voluntarily request, consent and authorize [facility] and its employed or contracted physicians, physician assistants, nurse practitioners or other licensed health care professionals (the Practitioner), to provide me with telemedicine health care services (the "Services") as deemed necessary by the treating Practitioner. I acknowledge and consent to receive the Services by the Practitioner via telemedicine. I understand that the telemedicine visit will involve communicating with the Practitioner through live audiovisual communication technology and the disclosure of certain medical information by electronic transmission. I acknowledge that I have been given the opportunity to request an in-person assessment or other available alternative prior to the telemedicine visit and am voluntarily participating in the telemedicine visit.  I understand that I have the right to withhold or withdraw my consent to the use of telemedicine in the course of my care at any time, without affecting my right to future care or treatment, and that the Practitioner or I may terminate the telemedicine visit at any time. I understand that I have the right to inspect all information obtained and/or recorded in the course of the telemedicine visit and may receive copies of available information for a reasonable fee.  I understand that some of the potential risks of receiving the Services via telemedicine include:  Delay or interruption in medical evaluation due to technological equipment failure or disruption; Information  transmitted may not be sufficient (e.g. poor resolution of images) to allow for appropriate medical decision making by the Practitioner; and/or  In rare instances, security protocols could fail, causing a breach of personal health information.  Furthermore, I acknowledge that it is my responsibility to provide information about my medical history, conditions and care that is complete and accurate to the best of my ability. I acknowledge that Practitioner's advice, recommendations, and/or decision may be based on factors not within their control, such as incomplete or inaccurate data provided by me or distortions of diagnostic images or specimens that may result from electronic transmissions. I understand that the practice of medicine is not an exact science and that Practitioner makes no warranties or guarantees regarding treatment outcomes. I acknowledge that a copy of this consent can be made available to me via my patient portal [facility] MyChart), or I can request a printed copy by calling the office of [facility].    I understand that my insurance will be billed for this visit.   I have read or had this consent read to me. I understand the contents of this consent, which adequately explains the benefits and risks of the Services being provided via telemedicine.  I have been provided ample opportunity to ask questions regarding this consent and the Services and have had my questions answered to my satisfaction. I give my informed consent for the services to be provided through the use of telemedicine in my medical care

## 2022-09-04 NOTE — Telephone Encounter (Signed)
Will review Echo scheduled for 8/20 and heart monitor with Dr. [name] and contact patient.

## 2022-09-04 NOTE — Telephone Encounter (Signed)
Pt is scheduled for Echo on 8/20 and tele on 8/21. Med rec and consent done. She is trying to schedule the surgery around 8/22.

## 2022-09-07 ENCOUNTER — Ambulatory Visit: Payer: 59 | Attending: Hematology and Oncology

## 2022-09-07 ENCOUNTER — Ambulatory Visit (HOSPITAL_COMMUNITY): Payer: 59 | Attending: Internal Medicine

## 2022-09-07 ENCOUNTER — Telehealth: Payer: Self-pay | Admitting: *Deleted

## 2022-09-07 VITALS — Wt 162.2 lb

## 2022-09-07 DIAGNOSIS — Z483 Aftercare following surgery for neoplasm: Secondary | ICD-10-CM | POA: Insufficient documentation

## 2022-09-07 DIAGNOSIS — I3139 Other pericardial effusion (noninflammatory): Secondary | ICD-10-CM | POA: Insufficient documentation

## 2022-09-07 LAB — ECHOCARDIOGRAM LIMITED
Area-P 1/2: 3.75 cm2
P 1/2 time: 544 msec
S' Lateral: 3.1 cm

## 2022-09-07 NOTE — Therapy (Signed)
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, her-2 positive, ER/PR negative   Clotting disorder (HCC)    testing was negative, h/o DVT while on treatment   Colon polyps    Complex regional pain syndrome i of right lower limb    RDS   Complication of anesthesia    Cystitis    Diabetes mellitus without complication (HCC)    DVT (deep venous thrombosis) (HCC)    started in 2015, multiple   Family history of breast cancer    Family history of colon cancer    Fibroid    Fibromyalgia    GERD (gastroesophageal reflux disease)    Heart murmur    HLD (hyperlipidemia)    HPV in female    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   Hypertension    Hypothyroidism    IBS (irritable bowel syndrome)    Mitral valve prolapse    Peptic ulcer    Personal history of malignant neoplasm of breast    Sleep apnea treated with continuous positive airway pressure (CPAP)    Past Surgical History:  Procedure Laterality Date   ABDOMINAL HYSTERECTOMY  2010   fibroids, h/o abnormal pap smears   AXILLARY SENTINEL NODE BIOPSY Right 07/08/2020   Procedure: RIGHT AXILLARY SENTINEL NODE BIOPSY;  Surgeon: Emelia Loron, MD;  Location: [facility] OR;  Service: General;  Laterality: Right;   BREAST RECONSTRUCTION WITH PLACEMENT OF TISSUE EXPANDER AND FLEX HD (ACELLULAR HYDRATED DERMIS) Bilateral  07/08/2020   Procedure: BILATERAL BREAST RECONSTRUCTION WITH  FLEX HD (ACELLULAR HYDRATED DERMIS),DIRECT IMPLANT RECONSTRUCTION;  Surgeon: Allena Napoleon, MD;  Location: [facility] OR;  Service: Plastics;  Laterality: Bilateral;   BREAST SURGERY Right 2015   right lumpectomy with reduction and lift   COLONOSCOPY     DENTAL SURGERY     ESOPHAGOGASTRODUODENOSCOPY     LAPAROSCOPIC GASTRIC SLEEVE RESECTION  2014   LAPAROSCOPIC OOPHERECTOMY Bilateral 2016   LEFT HEART CATH AND CORONARY ANGIOGRAPHY N/A 07/18/2022   Procedure: LEFT HEART CATH AND CORONARY ANGIOGRAPHY;  Surgeon: [name], MD;  Location: [facility] INVASIVE CV LAB;  Service: Cardiovascular;  Laterality: N/A;   PERICARDIOCENTESIS N/A 07/20/2022   Procedure: PERICARDIOCENTESIS;  Surgeon: [name], MD;  Location: [facility] INVASIVE CV LAB;  Service: Cardiovascular;  Laterality: N/A;   REPLACEMENT TOTAL KNEE Left 2018   also had 3 prior arthroscopies   TOE SURGERY Right    dislocated toe   TOTAL HIP ARTHROPLASTY Right 11/19/2020   Procedure: RIGHT TOTAL HIP ARTHROPLASTY ANTERIOR APPROACH;  Surgeon: Kathryne Hitch, MD;  Location: [facility] OR;  Service: Orthopedics;  Laterality: Right;   TOTAL KNEE ARTHROPLASTY Right 01/09/2022   Procedure: RIGHT TOTAL KNEE ARTHROPLASTY;  Surgeon: Kathryne Hitch, MD;  Location: [facility] ORS;  Service: Orthopedics;  Laterality: Right;   TOTAL MASTECTOMY Bilateral 07/08/2020   Procedure: BILATERAL TOTAL MASTECTOMY;  Surgeon: Emelia Loron, MD;  Location: [facility] OR;  Service: General;  Laterality: Bilateral;   VENA CAVA FILTER PLACEMENT  2017   Patient Active Problem List   Diagnosis Date Noted   Acquired absence of breast and absent nipple 08/24/2022   New onset a-fib (HCC) 07/22/2022   Pericardial effusion 07/19/2022   ST elevation 07/18/2022   Chest pain of uncertain etiology 07/18/2022   Pericarditis 07/18/2022   Nail dystrophy 05/05/2022   Status post total right knee replacement 01/09/2022    Obesity (BMI 30-39.9) 06/23/2021   Iron deficiency anemia 01/23/2021   Status post right hip replacement 12/03/2020   Status post total replacement of right hip 11/19/2020   Breast cancer (HCC) 07/08/2020   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   H/O total hysterectomy 06/11/2020   Genetic testing 05/27/2020   Chronic superficial gastritis 05/27/2020   Fibromyalgia 05/27/2020   GERD (gastroesophageal reflux disease) 05/27/2020   Prediabetes 05/27/2020   Complex regional pain syndrome I, unspecified 05/27/2020   Atypical lobular hyperplasia [facility]) of left breast 05/23/2020   History of therapeutic radiation 05/23/2020   Family history of breast cancer    Family history of colon cancer    Personal history of malignant neoplasm of breast    Unilateral primary osteoarthritis, right knee 02/26/2020   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   DVT (deep venous thrombosis) (HCC) 05/04/2018   Malignant neoplasm of upper-outer quadrant of right breast in female, estrogen receptor negative (HCC) 05/03/2018   Obstructive sleep apnea (adult) (pediatric) 09/14/2017   Adenopathy 07/15/2017   B12 deficiency 07/15/2017   Disorder of tympanic membrane of right ear 07/15/2017   Recurrent UTI 03/09/2017   Bladder pain 02/25/2017   Proteinuria 02/25/2017   Hematoma of thigh, left, subsequent encounter 08/16/2016   At risk for obstructive sleep apnea 07/01/2016   Suspected sleep apnea 07/01/2016   Arthritis of left knee 05/26/2016   Localized primary osteoarthritis of lower leg, left 05/26/2016   Clotting disorder (HCC) 05/22/2016   Chronic anticoagulation 01/06/2016   Right-sided epistaxis 01/06/2016   Urinary tract infection, site not specified 08/12/2015   Abnormal Pap smear of vagina 05/06/2015   Follow-up examination, following other surgery 12/14/2013   Pelvic and perineal pain 11/28/2013   Anxiety and depression 11/16/2013   H/O bariatric surgery 11/16/2013   Atrophic vaginitis 11/13/2013   Tear of  lateral cartilage or meniscus of knee, current 04/14/2013   Allergic rhinitis 01/16/2013   Female genital symptoms 11/10/2012   Symptomatic menopausal or female climacteric states 11/10/2012   Disequilibrium 06/09/2012   Morbid obesity (HCC) 02/09/2012   Vitamin D deficiency 09/13/2008   Preop examination 04/20/2008   Hypothyroidism 05/20/2007    REFERRING DIAG: right breast cancer at risk for lymphedema  THERAPY DIAG:  Aftercare following surgery for neoplasm  PERTINENT HISTORY: Rt THA 2022, Rt TKA 01/09/22, Lt TKA 2018, Hx of Breast Cancer s/p double mastectomy with sentinel lymph node removed from right side   PRECAUTIONS: right UE Lymphedema risk, None  SUBJECTIVE: Pt returns for her  month L-Dex screens. "They are going to have to remove my implant due to capsular contraction. That's going to be 09/21/22."  PAIN:  Are you having pain? Yes, 2/10 Rt breast feels tight  SOZO SCREENING: Patient was assessed today using the SOZO machine to determine the lymphedema index score. This  was compared to her baseline score. It was determined that she is within the recommended range when compared to her baseline and no further action is needed at this time. She will continue SOZO screenings. These are done every 3 months for 2 years post operatively followed by every 6 months for 2 years, and then annually.   L-DEX FLOWSHEETS - 09/07/22 1600       L-DEX LYMPHEDEMA SCREENING   Measurement Type Unilateral    L-DEX MEASUREMENT EXTREMITY Upper Extremity    POSITION  Standing    DOMINANT SIDE Left    At Risk Side Right    BASELINE SCORE (UNILATERAL) 5.9    L-DEX SCORE (UNILATERAL) 1.5    VALUE CHANGE (UNILAT) -4.4               Hermenia Bers, PTA 09/07/2022, 4:49 PM

## 2022-09-07 NOTE — Telephone Encounter (Signed)
   Name: Sierra Perez  DOB: [birth year]  MRN: [id number]   Primary Cardiologist: Bryan Lemma, MD  Chart reviewed as part of pre-operative protocol coverage. [name] was last seen on 07/31/2022 by Bernadene Person, NP for follow up of pericarditis. Repeat echo on 09/07/2022 showed no evidence of pericardial effusion. Event monitor July 2024 did not show significant arrhythmias. Patient is able to achieve >4.0 METs.   Therefore, based on ACC/AHA guidelines, the patient would be at acceptable risk for the planned procedure without further cardiovascular testing.   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 Based on that, would suggest 1 day hold on Eliquis for upcoming procedure. If surgeon needs > 1 day, would need to bridge."  I will route this recommendation to the requesting party via Epic fax function and remove from pre-op pool. Please call with questions.  Carlos Levering, NP 09/07/2022, 4:10 PM

## 2022-09-07 NOTE — Telephone Encounter (Signed)
Faxed order,demographics,insurance information,and recent office notes to [employer].  Confirmation received and copy scanned into the chart.//AB/CMA 

## 2022-09-09 ENCOUNTER — Other Ambulatory Visit (HOSPITAL_BASED_OUTPATIENT_CLINIC_OR_DEPARTMENT_OTHER): Payer: Self-pay

## 2022-09-09 ENCOUNTER — Encounter (HOSPITAL_BASED_OUTPATIENT_CLINIC_OR_DEPARTMENT_OTHER): Admission: RE | Payer: Self-pay | Source: Home / Self Care

## 2022-09-09 ENCOUNTER — Encounter: Payer: Self-pay | Admitting: Student

## 2022-09-09 ENCOUNTER — Ambulatory Visit (INDEPENDENT_AMBULATORY_CARE_PROVIDER_SITE_OTHER): Payer: 59 | Admitting: Student

## 2022-09-09 ENCOUNTER — Ambulatory Visit (HOSPITAL_BASED_OUTPATIENT_CLINIC_OR_DEPARTMENT_OTHER): Admission: RE | Admit: 2022-09-09 | Payer: 59 | Source: Home / Self Care | Admitting: Plastic Surgery

## 2022-09-09 VITALS — BP 115/74 | HR 70

## 2022-09-09 DIAGNOSIS — Z9013 Acquired absence of bilateral breasts and nipples: Secondary | ICD-10-CM

## 2022-09-09 SURGERY — REMOVAL BREAST IMPLANTS
Anesthesia: General | Site: Breast | Laterality: Right

## 2022-09-09 MED ORDER — OXYCODONE HCL 5 MG PO TABS: 5.0000 mg | ORAL_TABLET | Freq: Three times a day (TID) | ORAL | 0 refills | Status: DC | PRN

## 2022-09-09 MED ORDER — DOXYCYCLINE HYCLATE 100 MG PO TABS: 100.0000 mg | ORAL_TABLET | Freq: Two times a day (BID) | ORAL | 0 refills | Status: AC

## 2022-09-09 MED ORDER — ONDANSETRON HCL 4 MG PO TABS: 4.0000 mg | ORAL_TABLET | Freq: Three times a day (TID) | ORAL | 0 refills | Status: DC | PRN

## 2022-09-09 NOTE — Progress Notes (Unsigned)
Patient ID: Sierra Perez, female    DOB: Nov 01, 1960, 62 y.o.   MRN: [id number]  Chief Complaint  Patient presents with   Pre-op Exam    No diagnosis found.   History of Present Illness: Sierra Perez is a 62 y.o.  female  with a history of ***.  She presents for preoperative evaluation for upcoming procedure, ***, scheduled for *** with Dr. {[id number]::"[id number]","[id number]"}.  The patient {HAS HAS KYH:06237} had problems with anesthesia. ***  Summary of Previous Visit: Patient was seen by Dr. Ulice Bold on 03/13/2018 for to discuss her breast reconstruction revision.  Patient reported she was diagnosed with breast cancer on her right side approximately 10 years ago and underwent a right-sided lumpectomy with radiation.  Patient at the time also had a left-sided mastopexy reduction for symmetry.  Patient was then diagnosed with breast cancer and 2022 and opted for bilateral mastectomies with reconstruction.  Patient underwent bilateral mastectomies with immediate implant placement reconstruction.  Patient reported at this visit that her right breast seem to be getting tighter, which was most likely attributed to the radiation.  Patient stated that the left breast was sagging and she had not been wearing her bra.  Patient was also noted to have some excess fat in the mid axillary region of the lateral breast on each side.  Patient was noted to have 440 cc Mentor moderate profile extra implants in place.  Patient was then seen again by Dr. Ulice Bold on 08/18/2022.  At this visit, the patient noted discoloration of skin over the implant.  She was not aware of any trauma but acknowledged that it did not have a full feeling.  There was concern for skin loss over the implant.  Patient at that time was leaning towards removal of the right implant.  Patient was then seen again on 09/02/2022.  At this visit, the tightness had not improved but the color had changed a little bit.  It still had a  yellow/bruise color to it.  Patient reported she was very uncomfortable.  Plan was to move forward with removal of implant of the right breast implant.  Job: ***  PMH Significant for: ***   Past Medical History: Allergies: Allergies  Allergen Reactions   Succinylcholine Other (See Comments)    Muscle aching  18 years ago from 07/02/20   Azithromycin     **DOES NOT WORK** **DOES NOT WORK**    Keflex [Cephalexin] Diarrhea and Nausea And Vomiting   Silver Rash   Sulfa Antibiotics Rash    And swelling generalized not in throat   Sulfasalazine Rash    Current Medications:  Current Outpatient Medications:    acetaminophen (TYLENOL) 500 MG tablet, Take 1,000 mg by mouth every 6 (six) hours as needed for moderate pain., Disp: , Rfl:    albuterol (VENTOLIN HFA) 108 (90 Base) MCG/ACT inhaler, Inhale 1 puff into the lungs every 6 (six) hours as needed for wheezing or shortness of breath., Disp: , Rfl:    amiodarone (PACERONE) 200 MG tablet, Take 1 tablet (200 mg total) by mouth daily., Disp: 90 tablet, Rfl: 3   anastrozole (ARIMIDEX) 1 MG tablet, TAKE 1 TABLET(1 MG) BY MOUTH DAILY (Patient taking differently: Take 1 mg by mouth daily.), Disp: 90 tablet, Rfl: 3   apixaban (ELIQUIS) 5 MG TABS tablet, Take 1 tablet (5 mg total) by mouth 2 (two) times daily. Resume 07/22/22 PM dose., Disp: 180 tablet, Rfl: 2   Bacillus Coagulans-Inulin (PROBIOTIC) 1-250 BILLION-MG CAPS, Take  250 mg by mouth daily., Disp: , Rfl:    buPROPion (WELLBUTRIN XL) 150 MG 24 hr tablet, Take 150 mg by mouth daily., Disp: , Rfl:    CALCIUM CITRATE PO, Take 3 tablets by mouth daily., Disp: , Rfl:    Cholecalciferol (VITAMIN D-3) 25 MCG (1000 UT) CAPS, Take 1,000 Units by mouth daily., Disp: , Rfl:    colchicine 0.6 MG tablet, Take 1 tablet (0.6 mg total) by mouth 2 (two) times daily. (Patient taking differently: Take 0.6 mg by mouth daily.), Disp: 180 tablet, Rfl: 0   cyclobenzaprine (FLEXERIL) 10 MG tablet, Take 1 tablet  (10 mg total) by mouth at bedtime., Disp: 30 tablet, Rfl: 0   DULoxetine (CYMBALTA) 60 MG capsule, Take 60 mg by mouth daily., Disp: , Rfl:    fluticasone (FLONASE) 50 MCG/ACT nasal spray, Place 2 sprays into both nostrils daily., Disp: , Rfl:    gabapentin (NEURONTIN) 600 MG tablet, TAKE 1 TABLET(600 MG) BY MOUTH THREE TIMES DAILY (Patient taking differently: Take 600 mg by mouth 2 (two) times daily.), Disp: 90 tablet, Rfl: 0   ipratropium (ATROVENT) 0.03 % nasal spray, Place 2 sprays into both nostrils 2 (two) times daily., Disp: , Rfl:    Lactobacillus Rhamnosus, GG, (CULTURELLE PO), Take 1 capsule by mouth daily., Disp: , Rfl:    levothyroxine (SYNTHROID) 50 MCG tablet, Take 50 mcg by mouth every morning., Disp: , Rfl:    pantoprazole (PROTONIX) 40 MG tablet, Take 1 tablet (40 mg total) by mouth 2 (two) times daily., Disp: 180 tablet, Rfl: 3   rosuvastatin (CRESTOR) 5 MG tablet, Take 1 tablet (5 mg total) by mouth daily., Disp: 90 tablet, Rfl: 3   tirzepatide (MOUNJARO) 12.5 MG/0.5ML Pen, Inject 12.5 mg into the skin once a week., Disp: 2 mL, Rfl: 0   valACYclovir (VALTREX) 500 MG tablet, Take 1 tablet (500 mg total) by mouth daily. 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, her-2 positive, ER/PR negative   Clotting disorder (HCC)    testing was negative, h/o DVT while on treatment   Colon polyps    Complex regional pain syndrome i of right lower limb    RDS   Complication of anesthesia    Cystitis    Diabetes mellitus without complication (HCC)    DVT (deep venous thrombosis) (HCC)    started in 2015, multiple   Family history of breast cancer    Family  history of colon cancer    Fibroid    Fibromyalgia    GERD (gastroesophageal reflux disease)    Heart murmur    HLD (hyperlipidemia)    HPV in female    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   Hypertension    Hypothyroidism    IBS (irritable bowel syndrome)    Mitral valve prolapse    Peptic ulcer    Personal history of malignant neoplasm of breast    Sleep apnea treated with continuous positive airway pressure (CPAP)     Past Surgical History: Past Surgical History:  Procedure Laterality Date   ABDOMINAL HYSTERECTOMY  2010  fibroids, h/o abnormal pap smears   AXILLARY SENTINEL NODE BIOPSY Right 07/08/2020   Procedure: RIGHT AXILLARY SENTINEL NODE BIOPSY;  Surgeon: Emelia Loron, MD;  Location: [facility] OR;  Service: General;  Laterality: Right;   BREAST RECONSTRUCTION WITH PLACEMENT OF TISSUE EXPANDER AND FLEX HD (ACELLULAR HYDRATED DERMIS) Bilateral 07/08/2020   Procedure: BILATERAL BREAST RECONSTRUCTION WITH  FLEX HD (ACELLULAR HYDRATED DERMIS),DIRECT IMPLANT RECONSTRUCTION;  Surgeon: Allena Napoleon, MD;  Location: [facility] OR;  Service: Plastics;  Laterality: Bilateral;   BREAST SURGERY Right 2015   right lumpectomy with reduction and lift   COLONOSCOPY     DENTAL SURGERY     ESOPHAGOGASTRODUODENOSCOPY     LAPAROSCOPIC GASTRIC SLEEVE RESECTION  2014   LAPAROSCOPIC OOPHERECTOMY Bilateral 2016   LEFT HEART CATH AND CORONARY ANGIOGRAPHY N/A 07/18/2022   Procedure: LEFT HEART CATH AND CORONARY ANGIOGRAPHY;  Surgeon: [name], MD;  Location: [facility] INVASIVE CV LAB;  Service: Cardiovascular;  Laterality: N/A;   PERICARDIOCENTESIS N/A 07/20/2022   Procedure: PERICARDIOCENTESIS;  Surgeon: [name], MD;  Location: [facility] INVASIVE CV LAB;  Service: Cardiovascular;  Laterality: N/A;   REPLACEMENT TOTAL KNEE Left 2018   also had 3 prior arthroscopies   TOE SURGERY Right    dislocated toe   TOTAL HIP ARTHROPLASTY Right 11/19/2020   Procedure: RIGHT TOTAL HIP  ARTHROPLASTY ANTERIOR APPROACH;  Surgeon: Kathryne Hitch, MD;  Location: [facility] OR;  Service: Orthopedics;  Laterality: Right;   TOTAL KNEE ARTHROPLASTY Right 01/09/2022   Procedure: RIGHT TOTAL KNEE ARTHROPLASTY;  Surgeon: Kathryne Hitch, MD;  Location: [facility];  Service: Orthopedics;  Laterality: Right;   TOTAL MASTECTOMY Bilateral 07/08/2020   Procedure: BILATERAL TOTAL MASTECTOMY;  Surgeon: Emelia Loron, MD;  Location: [facility] OR;  Service: General;  Laterality: Bilateral;   VENA CAVA FILTER PLACEMENT  2017    Social History: Social History   Socioeconomic History   Marital status: Single    Spouse name: Not on file   Number of children: 1   Years of education: Not on file   Highest education level: Not on file  Occupational History   Occupation: [employer]  Tobacco Use   Smoking status: Never   Smokeless tobacco: Never  Vaping Use   Vaping status: Never Used  Substance and Sexual Activity   Alcohol use: Yes    Comment: rare   Drug use: Not Currently    Types: Marijuana   Sexual activity: Not Currently    Birth control/protection: Surgical    Comment: Hysterectomy  Other Topics Concern   Not on file  Social History Narrative   Not on file   Social Determinants of Health   Financial Resource Strain: Not on file  Food Insecurity: No Food Insecurity (07/20/2022)   Hunger Vital Sign    Worried About Running Out of Food in the Last Year: Never true    Ran Out of Food in the Last Year: Never true  Transportation Needs: No Transportation Needs (07/20/2022)   PRAPARE - Administrator, Civil Service (Medical): No    Lack of Transportation (Non-Medical): No  Physical Activity: Not on file  Stress: Not on file  Social Connections: Not on file  Intimate Partner Violence: Not At Risk (07/20/2022)   Humiliation, Afraid, Rape, and Kick questionnaire    Fear of Current or Ex-Partner: No    Emotionally Abused: No    Physically Abused: No    Sexually  Abused: No    Family History: Family History  Problem Relation Age of Onset   Hypertension Mother    Skin cancer Mother    Colon polyps Mother    Valvular heart disease Mother    Kidney disease Mother    Diabetes Father    Kidney disease Father    Dementia Father    Factor VIII deficiency Father    Colon polyps Brother    Bladder Cancer Maternal Grandmother        dx in late 13s; smoker   Stroke Maternal Grandmother    Diabetes Maternal Grandmother    Lung cancer Maternal Grandfather    Colon cancer Maternal Grandfather 26       origin   Thyroid cancer Maternal Grandfather        dx in his 88s   Breast cancer Paternal Grandmother        dx in 8s   Prostate cancer Other        MGMs brother    Review of Systems: ROS  Physical Exam: Vital Signs BP 115/74 (BP Location: Left Arm, Patient Position: Sitting, Cuff Size: Small)   Pulse 70   SpO2 98%   Physical Exam *** Constitutional:      General: Not in acute distress.    Appearance: Normal appearance. Not ill-appearing.  HENT:     Head: Normocephalic and atraumatic.  Eyes:     Pupils: Pupils are equal, round Neck:     Musculoskeletal: Normal range of motion.  Cardiovascular:     Rate and Rhythm: Normal rate    Pulses: Normal pulses.  Pulmonary:     Effort: Pulmonary effort is normal. No respiratory distress.  Abdominal:     General: Abdomen is flat. There is no distension.  Musculoskeletal: Normal range of motion.  Skin:    General: Skin is warm and dry.     Findings: No erythema or rash.  Neurological:     General: No focal deficit present.     Mental Status: Alert and oriented to person, place, and time. Mental status is at baseline.     Motor: No weakness.  Psychiatric:        Mood and Affect: Mood normal.        Behavior: Behavior normal.    Assessment/Plan: The patient is scheduled for *** with Dr. {[id number]::"[id number]","[id number]"}.  Risks, benefits, and alternatives of procedure discussed,  questions answered and consent obtained.    Smoking Status: ***; Counseling Given? *** Last Mammogram: ***; Results: ***  Caprini Score: ***; Risk Factors include: ***, BMI *** 25, and length of planned surgery. Recommendation for mechanical *** prophylaxis. Encourage early ambulation.   Pictures obtained: @consult ***  Post-op Rx sent to pharmacy:  Doxycycline, zofran, oxycodone - patient states she has an allergy to keflex and sulfa drugs   Patient was provided with the General Surgical Risk consent document and Pain Medication Agreement prior to their appointment.  They had adequate time to read through the risk consent documents and Pain Medication Agreement. We also discussed them in person together during this preop appointment. All of their questions were answered to their satisfaction.  Recommended calling if they have any further questions.  Risk consent form and Pain Medication Agreement to be scanned into patient's chart.  The consent was obtained with risks and complications reviewed which included bleeding, pain, scar, infection and the risk of anesthesia.  The patients questions were answered to the patients expressed satisfaction.    Electronically signed by: Laurena Spies, PA-C 09/09/2022 3:20 PM

## 2022-09-09 NOTE — H&P (View-Only) (Signed)
Patient ID: Sierra Perez, female    DOB: Mar 24, 1960, 62 y.o.   MRN: [id number]  Chief Complaint  Patient presents with   Pre-op Exam      ICD-10-CM   1. 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  female  with a history of breast cancer.  She presents for preoperative evaluation for upcoming procedure, right breast implant removal and capsulectomy, scheduled for 09/11/2022 with Dr. Ulice Bold.  Patient reports she has had issues with succinylcholine in the past.  She states that she has had a reaction to this that caused her pain.  She states otherwise she has not had any other issues with anesthesia.  Patient does have history of breast cancer.  She does report recently she was diagnosed with pericarditis and atrial fibrillation.  She states that she recently had an echo a few days ago which was normal besides some mild residual fluid.  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  She states that in the past, she has been bridged with Lovenox for surgery to hold the Eliquis.  Patient denies taking any hormonal replacement or birth control.  She denies any history of miscarriages.  She does report personal history of DVTs.  She states that she has had multiple episodes of DVTs.  She states that she has been tested for clotting disorder and reports she does not have any.  She denies any family history of clots.  Patient denies having any clotting disorders, but she states her father is positive for factor VIII deficiency.  Patient denies any recent traumas, surgeries or infections.  She denies any history of stroke or heart attack.  Patient states that she does not currently have a Port-A-Cath in place.  Patient denies any history of Crohn's disease or ulcerative colitis.  She denies currently having COPD or asthma.  Patient does report she has varicosities.  She denies any recent  fevers or chills.  Patient does reports she has history of obstructive sleep apnea.  She states she has not worn her CPAP as she has lost a lot of weight and has not had any difficulty sleeping.  Patient also states that she currently takes colchicine and that cardiology has prescribed it.  Patient does report history of anemia.  She states that has been stable.  She reports that she had this checked by her PCP recently.  Per chart review, her most recent hemoglobin was 9.4 on 07/22/2022.  Leading up to the day her hemoglobin was taken, it was 11.2, 12.7, 12.1, and 9.8.  Approximately 7 months ago, it was 10.0.  Per chart review, clearance was received by cardiology on 09/07/2022.  Note in the chart by Carlos Levering, NP with [facility]. Per cardiology NP, "Chart reviewed as part of pre-operative protocol coverage. Jennea Duve was last seen on 07/31/2022 by Bernadene Person, NP for follow up of pericarditis. Repeat echo on 09/07/2022 showed no evidence of pericardial effusion. Event monitor July 2024 did not show significant arrhythmias. Patient is able to achieve >4.0 METs. Therefore, based on ACC/AHA guidelines, the patient would be at acceptable risk for the planned procedure without further cardiovascular testing. 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 Based on that, would suggest  1 day hold on Eliquis for upcoming procedure. If surgeon needs > 1 day, would need to bridge."  Patient states that she has been seeing Dr. Pamelia Hoit who is her hematologist/oncologist.  Summary of Previous Visit: Patient was seen by Dr. Ulice Bold on 03/13/2018 for to discuss her breast reconstruction revision.  Patient reported she was diagnosed with breast cancer on her right side approximately 10 years ago and underwent a right-sided lumpectomy with radiation.  Patient at the time also had a left-sided mastopexy reduction for  symmetry.  Patient was then diagnosed with breast cancer and 2022 and opted for bilateral mastectomies with reconstruction.  Patient underwent bilateral mastectomies with immediate implant placement reconstruction.  Patient reported at this visit that her right breast seem to be getting tighter, which was most likely attributed to the radiation.  Patient stated that the left breast was sagging and she had not been wearing her bra.  Patient was also noted to have some excess fat in the mid axillary region of the lateral breast on each side.  Patient was noted to have 440 cc Mentor moderate profile extra implants in place.  Patient was then seen again by Dr. Ulice Bold on 08/18/2022.  At this visit, the patient noted discoloration of skin over the implant.  She was not aware of any trauma but acknowledged that it did not have a full feeling.  There was concern for skin loss over the implant.  Patient at that time was leaning towards removal of the right implant.  Patient was then seen again on 09/02/2022.  At this visit, the tightness had not improved but the color had changed a little bit.  It still had a yellow/bruise color to it.  Patient reported she was very uncomfortable.  Plan was to move forward with removal of implant of the right breast implant.  Job: Works as a Printmaker, otherwise works at the [employer]  She is planning to take 3 weeks off of work.  I discussed with her that if she needs any letters or paperwork filled out to contact our office.  PMH Significant for: DVTs, pericarditis, A-fib, obstructive sleep apnea, hypothyroidism, breast cancer with history of reconstruction  Patient also states that she and Dr. Ulice Bold discussed that patient will most likely stay the night after surgery.   Past Medical History: Allergies: Allergies  Allergen Reactions   Succinylcholine Other (See Comments)    Muscle aching  18 years ago from 07/02/20   Azithromycin     **DOES NOT  WORK** **DOES NOT WORK**    Keflex [Cephalexin] Diarrhea and Nausea And Vomiting   Silver Rash   Sulfa Antibiotics Rash    And swelling generalized not in throat   Sulfasalazine Rash    Current Medications:  Current Outpatient Medications:    acetaminophen (TYLENOL) 500 MG tablet, Take 1,000 mg by mouth every 6 (six) hours as needed for moderate pain., Disp: , Rfl:    albuterol (VENTOLIN HFA) 108 (90 Base) MCG/ACT inhaler, Inhale 1 puff into the lungs every 6 (six) hours as needed for wheezing or shortness of breath., Disp: , Rfl:    amiodarone (PACERONE) 200 MG tablet, Take 1 tablet (200 mg total) by mouth daily., Disp: 90 tablet, Rfl: 3   anastrozole (ARIMIDEX) 1 MG tablet, TAKE 1 TABLET(1 MG) BY MOUTH DAILY (Patient taking differently: Take 1 mg by mouth daily.), Disp: 90 tablet, Rfl: 3   apixaban (ELIQUIS) 5 MG TABS tablet, Take 1 tablet (5 mg total) by mouth  2 (two) times daily. Resume 07/22/22 PM dose., Disp: 180 tablet, Rfl: 2   Bacillus Coagulans-Inulin (PROBIOTIC) 1-250 BILLION-MG CAPS, Take 250 mg by mouth daily., Disp: , Rfl:    buPROPion (WELLBUTRIN XL) 150 MG 24 hr tablet, Take 150 mg by mouth daily., Disp: , Rfl:    CALCIUM CITRATE PO, Take 3 tablets by mouth daily., Disp: , Rfl:    Cholecalciferol (VITAMIN D-3) 25 MCG (1000 UT) CAPS, Take 1,000 Units by mouth daily., Disp: , Rfl:    colchicine 0.6 MG tablet, Take 1 tablet (0.6 mg total) by mouth 2 (two) times daily. (Patient taking differently: Take 0.6 mg by mouth daily.), Disp: 180 tablet, Rfl: 0   cyclobenzaprine (FLEXERIL) 10 MG tablet, Take 1 tablet (10 mg total) by mouth at bedtime., Disp: 30 tablet, Rfl: 0   doxycycline (VIBRA-TABS) 100 MG tablet, Take 1 tablet (100 mg total) by mouth 2 (two) times daily for 5 days., Disp: 10 tablet, Rfl: 0   DULoxetine (CYMBALTA) 60 MG capsule, Take 60 mg by mouth daily., Disp: , Rfl:    fluticasone (FLONASE) 50 MCG/ACT nasal spray, Place 2 sprays into both nostrils daily., Disp: ,  Rfl:    gabapentin (NEURONTIN) 600 MG tablet, TAKE 1 TABLET(600 MG) BY MOUTH THREE TIMES DAILY (Patient taking differently: Take 600 mg by mouth 2 (two) times daily.), Disp: 90 tablet, Rfl: 0   ipratropium (ATROVENT) 0.03 % nasal spray, Place 2 sprays into both nostrils 2 (two) times daily., Disp: , Rfl:    Lactobacillus Rhamnosus, GG, (CULTURELLE PO), Take 1 capsule by mouth daily., Disp: , Rfl:    levothyroxine (SYNTHROID) 50 MCG tablet, Take 50 mcg by mouth every morning., Disp: , Rfl:    ondansetron (ZOFRAN) 4 MG tablet, Take 1 tablet (4 mg total) by mouth every 8 (eight) hours as needed for up to 20 doses for nausea or vomiting., Disp: 20 tablet, Rfl: 0   oxyCODONE (ROXICODONE) 5 MG immediate release tablet, Take 1 tablet (5 mg total) by mouth every 8 (eight) hours as needed for up to 20 doses for severe pain., Disp: 20 tablet, Rfl: 0   pantoprazole (PROTONIX) 40 MG tablet, Take 1 tablet (40 mg total) by mouth 2 (two) times daily., Disp: 180 tablet, Rfl: 3   rosuvastatin (CRESTOR) 5 MG tablet, Take 1 tablet (5 mg total) by mouth daily., Disp: 90 tablet, Rfl: 3   tirzepatide (MOUNJARO) 12.5 MG/0.5ML Pen, Inject 12.5 mg into the skin once a week., Disp: 2 mL, Rfl: 0   valACYclovir (VALTREX) 500 MG tablet, Take 1 tablet (500 mg total) by mouth daily. 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, her-2 positive, ER/PR negative   Clotting disorder (HCC)    testing was negative, h/o DVT while on treatment   Colon polyps    Complex regional pain syndrome i of right lower limb    RDS   Complication of anesthesia    Cystitis    Diabetes mellitus without complication (HCC)     DVT (deep venous thrombosis) (HCC)    started in 2015, multiple   Family history of breast cancer  Family history of colon cancer    Fibroid    Fibromyalgia    GERD (gastroesophageal reflux disease)    Heart murmur    HLD (hyperlipidemia)    HPV in female    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   Hypertension    Hypothyroidism    IBS (irritable bowel syndrome)    Mitral valve prolapse    Peptic ulcer    Personal history of malignant neoplasm of breast    Sleep apnea treated with continuous positive airway pressure (CPAP)     Past Surgical History: Past Surgical History:  Procedure Laterality Date   ABDOMINAL HYSTERECTOMY  2010   fibroids, h/o abnormal pap smears   AXILLARY SENTINEL NODE BIOPSY Right 07/08/2020   Procedure: RIGHT AXILLARY SENTINEL NODE BIOPSY;  Surgeon: Emelia Loron, MD;  Location: [facility] OR;  Service: General;  Laterality: Right;   BREAST RECONSTRUCTION WITH PLACEMENT OF TISSUE EXPANDER AND FLEX HD (ACELLULAR HYDRATED DERMIS) Bilateral 07/08/2020   Procedure: BILATERAL BREAST RECONSTRUCTION WITH  FLEX HD (ACELLULAR HYDRATED DERMIS),DIRECT IMPLANT RECONSTRUCTION;  Surgeon: Allena Napoleon, MD;  Location: [facility] OR;  Service: Plastics;  Laterality: Bilateral;   BREAST SURGERY Right 2015   right lumpectomy with reduction and lift   COLONOSCOPY     DENTAL SURGERY     ESOPHAGOGASTRODUODENOSCOPY     LAPAROSCOPIC GASTRIC SLEEVE RESECTION  2014   LAPAROSCOPIC OOPHERECTOMY Bilateral 2016   LEFT HEART CATH AND CORONARY ANGIOGRAPHY N/A 07/18/2022   Procedure: LEFT HEART CATH AND CORONARY ANGIOGRAPHY;  Surgeon: [name], MD;  Location[facility] INVASIVE CV LAB;  Service: Cardiovascular;  Laterality: N/A;   PERICARDIOCENTESIS N/A 07/20/2022   Procedure: PERICARDIOCENTESIS;  Surgeon: [name], MD;  Location: [facility] INVASIVE CV LAB;  Service: Cardiovascular;  Laterality: N/A;   REPLACEMENT TOTAL KNEE Left 2018   also had 3 prior arthroscopies    TOE SURGERY Right    dislocated toe   TOTAL HIP ARTHROPLASTY Right 11/19/2020   Procedure: RIGHT TOTAL HIP ARTHROPLASTY ANTERIOR APPROACH;  Surgeon: Kathryne Hitch, MD;  Location: [facility] OR;  Service: Orthopedics;  Laterality: Right;   TOTAL KNEE ARTHROPLASTY Right 01/09/2022   Procedure: RIGHT TOTAL KNEE ARTHROPLASTY;  Surgeon: Kathryne Hitch, MD;  Location: [facility] ORS;  Service: Orthopedics;  Laterality: Right;   TOTAL MASTECTOMY Bilateral 07/08/2020   Procedure: BILATERAL TOTAL MASTECTOMY;  Surgeon: Emelia Loron, MD;  Location: [facility] OR;  Service: General;  Laterality: Bilateral;   VENA CAVA FILTER PLACEMENT  2017    Social History: Social History   Socioeconomic History   Marital status: Single    Spouse name: Not on file   Number of children: 1   Years of education: Not on file   Highest education level: Not on file  Occupational History   Occupation: Attroney  Tobacco Use   Smoking status: Never   Smokeless tobacco: Never  Vaping Use   Vaping status: Never Used  Substance and Sexual Activity   Alcohol use: Yes    Comment: rare   Drug use: Not Currently    Types: Marijuana   Sexual activity: Not Currently    Birth control/protection: Surgical    Comment: Hysterectomy  Other Topics Concern   Not on file  Social History Narrative   Not on file   Social Determinants of Health   Financial Resource Strain: Not on file  Food Insecurity: No Food Insecurity (07/20/2022)   Hunger Vital Sign    Worried About  Running Out of Food in the Last Year: Never true    Ran Out of Food in the Last Year: Never true  Transportation Needs: No Transportation Needs (07/20/2022)   PRAPARE - Administrator, Civil Service (Medical): No    Lack of Transportation (Non-Medical): No  Physical Activity: Not on file  Stress: Not on file  Social Connections: Not on file  Intimate Partner Violence: Not At Risk (07/20/2022)   Humiliation, Afraid, Rape, and Kick questionnaire     Fear of Current or Ex-Partner: No    Emotionally Abused: No    Physically Abused: No    Sexually Abused: No    Family History: Family History  Problem Relation Age of Onset   Hypertension Mother    Skin cancer Mother    Colon polyps Mother    Valvular heart disease Mother    Kidney disease Mother    Diabetes Father    Kidney disease Father    Dementia Father    Factor VIII deficiency Father    Colon polyps Brother    Bladder Cancer Maternal Grandmother        dx in late 52s; smoker   Stroke Maternal Grandmother    Diabetes Maternal Grandmother    Lung cancer Maternal Grandfather    Colon cancer Maternal Grandfather 36       origin   Thyroid cancer Maternal Grandfather        dx in his 29s   Breast cancer Paternal Grandmother        dx in 61s   Prostate cancer Other        MGMs brother    Review of Systems: Denies any recent fevers or chills   Physical Exam: Vital Signs BP 115/74 (BP Location: Left Arm, Patient Position: Sitting, Cuff Size: Small)   Pulse 70   SpO2 98%   Physical Exam  Constitutional:      General: Not in acute distress.    Appearance: Normal appearance. Not ill-appearing.  HENT:     Head: Normocephalic and atraumatic.  Neck:     Musculoskeletal: Normal range of motion.  Cardiovascular:     Rate and Rhythm: Normal rate Pulmonary:     Effort: Pulmonary effort is normal. No respiratory distress.  Musculoskeletal: Normal range of motion.  Skin:    General: Skin is warm and dry.     Findings: No erythema or rash.  Neurological:     Mental Status: Alert and oriented to person, place, and time. Mental status is at baseline.  Psychiatric:        Mood and Affect: Mood normal.        Behavior: Behavior normal.    Assessment/Plan: The patient is scheduled for removal of right breast implant with Dr. Ulice Bold.  Risks, benefits, and alternatives of procedure discussed, questions answered and consent obtained.    Smoking Status: Nonsmoker;  Counseling Given? N/A Last Mammogram: N/A -history of bilateral mastectomies  Caprini Score: 9; Risk Factors include: Age, history of malignancy, history of DVT, varicose veins, and length of planned surgery. Recommendation for mechanical prophylaxis. Encourage early ambulation.  Will also have patient's start Eliquis day after surgery.  I discussed with her given her history of DVTs, she is at a higher risk of developing a blood clot after surgery and discussed with her the importance of ambulating after surgery.  Patient expressed understanding.  Pictures obtained: @consult   Post-op Rx sent to pharmacy:  Doxycycline, zofran, oxycodone - patient states she  has an allergy to keflex and sulfa drugs   Discussed with patient that typically we have patients hold Eliquis 1 to 2 days prior to surgery.  I will discuss with Dr. Ulice Bold if she would like her to hold her Eliquis greater than 1 days before surgery.  If that is the case, per cardiology recommendations, patient may need to be bridged with Lovenox.  I will reach out to Dr. Pamelia Hoit to see if he is managing this patient's Eliquis/DVTs.  If so, will request recommendations for Eliquis/Lovenox bridge.   Will discuss patient's case with Dr. Ulice Bold.  Will see if she would still like patient to spend the night.  Will also check to see if Dr. Ulice Bold would like a repeat hemoglobin given history of patient's anemia.  I will send a clearance to patient's PCP.  I also instructed patient to hold any vitamins or supplements at least 1 week prior to surgery.  Discussed with her to hold her Mounjaro at least the dose before surgery.  Patient also states that she is taking colchicine which was prescribed by cardiology.  Will get clearance to hold the colchicine the day of surgery.  Patient was provided with the General Surgical Risk consent document and Pain Medication Agreement prior to their appointment.  They had adequate time to read through the  risk consent documents and Pain Medication Agreement. We also discussed them in person together during this preop appointment. All of their questions were answered to their satisfaction.  Recommended calling if they have any further questions.  Risk consent form and Pain Medication Agreement to be scanned into patient's chart.  I discussed with the patient that given her history of obstructive sleep apnea, it may cause complications with anesthesia and in the perioperative period.  Patient expressed understanding.  The consent was obtained with risks and complications reviewed which included bleeding, pain, scar, infection and the risk of anesthesia.  The patients questions were answered to the patients expressed satisfaction.   Patient also states that she is okay with photos that are placed in the chart.   Electronically signed by: Laurena Spies, PA-C 09/10/2022 7:47 AM

## 2022-09-10 ENCOUNTER — Telehealth: Payer: Self-pay | Admitting: *Deleted

## 2022-09-10 ENCOUNTER — Encounter: Payer: Self-pay | Admitting: Student

## 2022-09-10 ENCOUNTER — Other Ambulatory Visit: Payer: Self-pay

## 2022-09-10 MED ORDER — ENOXAPARIN SODIUM 60 MG/0.6ML IJ SOSY: 60.0000 mg | PREFILLED_SYRINGE | Freq: Two times a day (BID) | INTRAMUSCULAR | 0 refills | Status: DC

## 2022-09-10 NOTE — Telephone Encounter (Signed)
-----   Message from Laurena Spies sent at 09/10/2022  8:14 AM EDT ----- Regarding: Clearances Good Morning ,  Can you please send clearances to the following providers for this patient:  Cardiology (Dr. [name]) -please request if patient can hold colchicine the morning of surgery.  Patient states that she has been receiving this from cardiology and that it is not for gout flareups.  PCP (Dr. Donette Larry) -at [facility].  I am also going to reach out to Dr. Pamelia Hoit to see if he is managing patient's Eliquis.  If he is, I will send a clearance to him for holding the Eliquis.  Thank you, Alan Ripper

## 2022-09-10 NOTE — H&P (View-Only) (Signed)
 Reached out to patient's hematologist Dr. Pamelia Hoit in regards to her Eliquis.  Per Dr. Pamelia Hoit, patient will hold Eliquis 48 hours before surgery.  She will be bridged with Lovenox 1 mg/kg twice daily x 4 doses.  Patient will resume her Eliquis the day after surgery.  Patient was notified by hematology team and Lovenox was sent in by hematology team.  I did call the patient to confirm and she states that she was aware of holding her Eliquis and the Lovenox bridge and she had no further questions about which she needed to do.

## 2022-09-10 NOTE — Telephone Encounter (Signed)
Surgical clearance requests faxed to Dr. [name] (fax #[phone]) and Dr. Donette Larry (fax # [phone]) with fax confirmation received

## 2022-09-10 NOTE — Telephone Encounter (Signed)
   [facility] Pre-operative Risk Assessment    Patient Name: Sierra Perez  DOB: 12/19/1960 MRN: [id number]  HEARTCARE STAFF:  - IMPORTANT!!!!!! Under Visit Info/Reason for Call, type in Other and utilize the format Clearance MM/DD/YY or Clearance TBD. Do not use dashes or single digits. - Please review there is not already an duplicate clearance open for this procedure. - If request is for dental extraction, please clarify the # of teeth to be extracted. - If the patient is currently at the dentist's office, call Pre-Op Callback Staff (MA/nurse) to input urgent request.  - If the patient is not currently in the dentist office, please route to the Pre-Op pool.  Request for surgical clearance:  What type of surgery is being performed? Removal of right breast implant; right capsulectomy  When is this surgery scheduled? 09/21/22  What type of clearance is required (medical clearance vs. Pharmacy clearance to hold med vs. Both)? Both  Are there any medications that need to be held prior to surgery and how long? Eliquis, also want to know if pt can hold colchicine morning of  Practice name and name of physician performing surgery? Dr Foster Simpson  What is the office phone number? [phone]   7.   What is the office fax number? [phone]  8.   Anesthesia type (None, local, MAC, general) ? General   ,  L 09/10/2022, 3:51 PM  _________________________________________________________________   (provider comments below)

## 2022-09-10 NOTE — Progress Notes (Signed)
Pt with upcoming surgery to remove breast implants. Per MD, hold Eliquis 48hrs before procedure, start lovenox BID for the 2 days prior to surgery, nothing on day of surgery, and resume Eliquis day after surgery. Lovenox ordered. Called pt with above message who verbalized understanding.

## 2022-09-10 NOTE — Progress Notes (Signed)
Reached out to patient's hematologist Dr. Pamelia Hoit in regards to her Eliquis.  Per Dr. Pamelia Hoit, patient will hold Eliquis 48 hours before surgery.  She will be bridged with Lovenox 1 mg/kg twice daily x 4 doses.  Patient will resume her Eliquis the day after surgery.  Patient was notified by hematology team and Lovenox was sent in by hematology team.  I did call the patient to confirm and she states that she was aware of holding her Eliquis and the Lovenox bridge and she had no further questions about which she needed to do.

## 2022-09-11 ENCOUNTER — Ambulatory Visit (INDEPENDENT_AMBULATORY_CARE_PROVIDER_SITE_OTHER): Payer: Self-pay | Admitting: Podiatry

## 2022-09-11 DIAGNOSIS — L603 Nail dystrophy: Secondary | ICD-10-CM

## 2022-09-11 NOTE — Telephone Encounter (Signed)
Patient with diagnosis of A Fib on Eliquis for anticoagulation.    Procedure: Removal of right breast implant; right capsulectomy  Date of procedure: 09/21/22   CHA2DS2-VASc Score = 3  This indicates a 3.2% annual risk of stroke. The patient's score is based upon: CHF History: 0 HTN History: 1 Diabetes History: 1 Stroke History: 0 Vascular Disease History: 0 Age Score: 0 Gender Score: 1    CrCl 109 mL/min Platelet count 193K  Per office protocol, patient can hold Eliquis for 2 days prior to procedure.    Due to patient's history of recurrent DVT and breast cancer, recommend restarting Eliquis as soon as possible after procedure.   **This guidance is not considered finalized until pre-operative APP has relayed final recommendations.**

## 2022-09-11 NOTE — Telephone Encounter (Signed)
Sierra Perez patient's chart was reviewed for preoperative cardiac evaluation.  He/she was seen by you on 07/31/2022 and according to protocol, we request that you comment on cardiac risk for upcoming procedure since office visit was less than 2 months ago.    Please route your response to p cv div preop.  Thank you, Marcelino Duster

## 2022-09-11 NOTE — Progress Notes (Signed)
Patient presents today for the 5th laser treatment. Diagnosed with mycotic nail infection by Dr. Al Corpus.   Toenails most affected: bilateral 1st and right fifth  Reviewed laser safety / safety glasses dispensed to patient to wear during treatment.  The affected nails were debrided with sterile nail nippers and power debriding bur. Laser therapy was administered to all 10 toenails for a single pass.  This was repeated on the bilateral hallux and right fifth toenail for a second pass.  She tolerated this well.  Follow up in 6 weeks for laser # 6

## 2022-09-11 NOTE — Telephone Encounter (Signed)
Pharmacy please advise on holding Eliquis prior to Removal of right breast implant; right capsulectomy scheduled for 09/21/2022.  Thank you.

## 2022-09-11 NOTE — Telephone Encounter (Signed)
   Patient Name: Sierra Perez  DOB: 04-25-1960 MRN: [id number]  Primary Cardiologist: Bryan Lemma, MD  Chart reviewed as part of pre-operative protocol coverage. Given past medical history and time since last visit, based on ACC/AHA guidelines, Sierra Perez is at acceptable risk for the planned procedure without further cardiovascular testing.   Per office protocol, patient can hold Eliquis for 2 days prior to procedure.     Due to patient's history of recurrent DVT and breast cancer, recommend restarting Eliquis as soon as possible after procedure.    The patient was advised that if she develops new symptoms prior to surgery to contact our office to arrange for a follow-up visit, and she verbalized understanding.  I will route this recommendation to the requesting party via Epic fax function and remove from pre-op pool.  Please call with questions.  Joni Reining, NP 09/11/2022, 11:09 AM

## 2022-09-14 ENCOUNTER — Ambulatory Visit (INDEPENDENT_AMBULATORY_CARE_PROVIDER_SITE_OTHER): Payer: 59 | Admitting: Obstetrics & Gynecology

## 2022-09-14 ENCOUNTER — Other Ambulatory Visit (HOSPITAL_COMMUNITY)
Admission: RE | Admit: 2022-09-14 | Discharge: 2022-09-14 | Disposition: A | Discharge status: Home / Self Care | Payer: 59 | Source: Ambulatory Visit | Attending: Obstetrics & Gynecology | Admitting: Obstetrics & Gynecology

## 2022-09-14 ENCOUNTER — Other Ambulatory Visit: Payer: Self-pay

## 2022-09-14 ENCOUNTER — Encounter (HOSPITAL_BASED_OUTPATIENT_CLINIC_OR_DEPARTMENT_OTHER): Payer: Self-pay | Admitting: Plastic Surgery

## 2022-09-14 ENCOUNTER — Encounter (HOSPITAL_BASED_OUTPATIENT_CLINIC_OR_DEPARTMENT_OTHER): Payer: Self-pay | Admitting: Obstetrics & Gynecology

## 2022-09-14 VITALS — BP 113/56 | HR 67 | Ht 68.0 in | Wt 161.4 lb

## 2022-09-14 DIAGNOSIS — N898 Other specified noninflammatory disorders of vagina: Secondary | ICD-10-CM

## 2022-09-14 DIAGNOSIS — C50911 Malignant neoplasm of unspecified site of right female breast: Secondary | ICD-10-CM

## 2022-09-14 DIAGNOSIS — Z853 Personal history of malignant neoplasm of breast: Secondary | ICD-10-CM

## 2022-09-14 DIAGNOSIS — Z124 Encounter for screening for malignant neoplasm of cervix: Secondary | ICD-10-CM

## 2022-09-14 DIAGNOSIS — Z01419 Encounter for gynecological examination (general) (routine) without abnormal findings: Secondary | ICD-10-CM

## 2022-09-14 DIAGNOSIS — Z8742 Personal history of other diseases of the female genital tract: Secondary | ICD-10-CM | POA: Diagnosis not present

## 2022-09-14 DIAGNOSIS — Z90710 Acquired absence of both cervix and uterus: Secondary | ICD-10-CM

## 2022-09-14 DIAGNOSIS — D689 Coagulation defect, unspecified: Secondary | ICD-10-CM

## 2022-09-14 DIAGNOSIS — B009 Herpesviral infection, unspecified: Secondary | ICD-10-CM

## 2022-09-14 MED ORDER — VALACYCLOVIR HCL 500 MG PO TABS: 500.0000 mg | ORAL_TABLET | Freq: Every day | ORAL | 3 refills | Status: DC

## 2022-09-14 NOTE — Progress Notes (Unsigned)
62 y.o. G85P1001 Single White or Caucasian female here for annual exam.  Has breast implant removal scheduled for next week with Dr. Ulice Bold.  Had developed capsule around the implant.  Breast is very tight and she is concerned about rupture.  Is not planning to have new implant placed.  Has already been seen at [facility].  Has lost weight after starting Mounjaro for diabetes.  Most recent hba1c was 4.6.  Reports she just doesn't have much of an appetite.  BMI is 24.  She has considered trying to lose another 10 pounds.  BMI 24.  Feel she is at a good place.  Mounjaro dosing is being decreased.  This is being followed by Dr. Donette Larry, PCP.  Denies vaginal bleeding.    Had ferritin done with PCP.  Hb was  9.4 on 6/19.    She has hx of HSV.  Was having recurrent outbreaks last year but this is under better control.  May considering going back to using intermittently but not until surgery is completed and healing is progressing well.  Will need RF do daily dosing for now.  No LMP recorded. Patient has had a hysterectomy.          Sexually active: No.  The current method of family planning is post menopausal status.    Smoker:  no  Health Maintenance: Pap:  02/20/2019 Negative History of abnormal Pap:  h/o HPV 30+ years ago MMG:  not indicated Colonoscopy:  10/02/2021, follow up 5 years BMD:   10/2019, -1.2 Screening Labs: does with PCP   reports that she has never smoked. She has never used smokeless tobacco. She reports current alcohol use. She reports that she does not currently use drugs after having used the following drugs: Marijuana.  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, her-2 positive, ER/PR negative   Clotting disorder (HCC)    testing was negative, h/o DVT while on treatment   Colon polyps    Complex regional pain syndrome i of right lower limb    RDS   Complication of  anesthesia    Cystitis    Diabetes mellitus without complication (HCC)    DVT (deep venous thrombosis) (HCC)    started in 2015, multiple   Family history of breast cancer    Family history of colon cancer    Fibroid    Fibromyalgia    GERD (gastroesophageal reflux disease)    Heart murmur    HLD (hyperlipidemia)    HPV in female    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   Hypertension    Hypothyroidism    IBS (irritable bowel syndrome)    Mitral valve prolapse    Peptic ulcer    Personal history of malignant neoplasm of breast    Sleep apnea treated with continuous positive airway pressure (CPAP)     Past Surgical History:  Procedure Laterality Date   ABDOMINAL HYSTERECTOMY  2010   fibroids, h/o abnormal pap smears   AXILLARY SENTINEL NODE BIOPSY Right 07/08/2020   Procedure: RIGHT AXILLARY SENTINEL NODE BIOPSY;  Surgeon: Emelia Loron, MD;  Location: [facility] OR;  Service: General;  Laterality: Right;   BREAST RECONSTRUCTION WITH PLACEMENT OF TISSUE EXPANDER AND FLEX HD (ACELLULAR HYDRATED DERMIS) Bilateral 07/08/2020   Procedure: BILATERAL BREAST RECONSTRUCTION WITH  FLEX HD (ACELLULAR  HYDRATED DERMIS),DIRECT IMPLANT RECONSTRUCTION;  Surgeon: Allena Napoleon, MD;  Location: [facility] OR;  Service: Plastics;  Laterality: Bilateral;   BREAST SURGERY Right 2015   right lumpectomy with reduction and lift   COLONOSCOPY     DENTAL SURGERY     ESOPHAGOGASTRODUODENOSCOPY     LAPAROSCOPIC GASTRIC SLEEVE RESECTION  2014   LAPAROSCOPIC OOPHERECTOMY Bilateral 2016   LEFT HEART CATH AND CORONARY ANGIOGRAPHY N/A 07/18/2022   Procedure: LEFT HEART CATH AND CORONARY ANGIOGRAPHY;  Surgeon: [name], MD;  Location: [facility] INVASIVE CV LAB;  Service: Cardiovascular;  Laterality: N/A;   PERICARDIOCENTESIS N/A 07/20/2022   Procedure: PERICARDIOCENTESIS;  Surgeon: [name], MD;  Location: [facility] INVASIVE CV LAB;  Service: Cardiovascular;  Laterality: N/A;   REPLACEMENT TOTAL  KNEE Left 2018   also had 3 prior arthroscopies   TOE SURGERY Right    dislocated toe   TOTAL HIP ARTHROPLASTY Right 11/19/2020   Procedure: RIGHT TOTAL HIP ARTHROPLASTY ANTERIOR APPROACH;  Surgeon: Kathryne Hitch, MD;  Location: [facility] OR;  Service: Orthopedics;  Laterality: Right;   TOTAL KNEE ARTHROPLASTY Right 01/09/2022   Procedure: RIGHT TOTAL KNEE ARTHROPLASTY;  Surgeon: Kathryne Hitch, MD;  Location: [facility];  Service: Orthopedics;  Laterality: Right;   TOTAL MASTECTOMY Bilateral 07/08/2020   Procedure: BILATERAL TOTAL MASTECTOMY;  Surgeon: Emelia Loron, MD;  Location: [facility] OR;  Service: General;  Laterality: Bilateral;   VENA CAVA FILTER PLACEMENT  2017    Current Outpatient Medications  Medication Sig Dispense Refill   acetaminophen (TYLENOL) 500 MG tablet Take 1,000 mg by mouth every 6 (six) hours as needed for moderate pain.     albuterol (VENTOLIN HFA) 108 (90 Base) MCG/ACT inhaler Inhale 1 puff into the lungs every 6 (six) hours as needed for wheezing or shortness of breath.     amiodarone (PACERONE) 200 MG tablet Take 1 tablet (200 mg total) by mouth daily. 90 tablet 3   anastrozole (ARIMIDEX) 1 MG tablet TAKE 1 TABLET(1 MG) BY MOUTH DAILY (Patient taking differently: Take 1 mg by mouth daily.) 90 tablet 3   apixaban (ELIQUIS) 5 MG TABS tablet Take 1 tablet (5 mg total) by mouth 2 (two) times daily. Resume 07/22/22 PM dose. 180 tablet 2   Bacillus Coagulans-Inulin (PROBIOTIC) 1-250 BILLION-MG CAPS Take 250 mg by mouth daily.     buPROPion (WELLBUTRIN XL) 150 MG 24 hr tablet Take 150 mg by mouth daily.     CALCIUM CITRATE PO Take 3 tablets by mouth daily.     Cholecalciferol (VITAMIN D-3) 25 MCG (1000 UT) CAPS Take 1,000 Units by mouth daily.     colchicine 0.6 MG tablet Take 1 tablet (0.6 mg total) by mouth 2 (two) times daily. (Patient taking differently: Take 0.6 mg by mouth daily.) 180 tablet 0   DULoxetine (CYMBALTA) 60 MG capsule Take 60 mg by mouth daily.      fluticasone (FLONASE) 50 MCG/ACT nasal spray Place 2 sprays into both nostrils daily.     gabapentin (NEURONTIN) 600 MG tablet TAKE 1 TABLET(600 MG) BY MOUTH THREE TIMES DAILY (Patient taking differently: Take 600 mg by mouth 2 (two) times daily.) 90 tablet 0   ipratropium (ATROVENT) 0.03 % nasal spray Place 2 sprays into both nostrils 2 (two) times daily.     Lactobacillus Rhamnosus, GG, (CULTURELLE PO) Take 1 capsule by mouth daily.     levothyroxine (SYNTHROID) 50 MCG tablet Take 50 mcg by mouth every morning.     ondansetron (ZOFRAN)  4 MG tablet Take 1 tablet (4 mg total) by mouth every 8 (eight) hours as needed for up to 20 doses for nausea or vomiting. 20 tablet 0   oxyCODONE (ROXICODONE) 5 MG immediate release tablet Take 1 tablet (5 mg total) by mouth every 8 (eight) hours as needed for up to 20 doses for severe pain. 20 tablet 0   pantoprazole (PROTONIX) 40 MG tablet Take 1 tablet (40 mg total) by mouth 2 (two) times daily. 180 tablet 3   rosuvastatin (CRESTOR) 5 MG tablet Take 1 tablet (5 mg total) by mouth daily. 90 tablet 3   tirzepatide (MOUNJARO) 12.5 MG/0.5ML Pen Inject 12.5 mg into the skin once a week. 2 mL 0   enoxaparin (LOVENOX) 60 MG/0.6ML injection Inject 0.6 mLs (60 mg total) into the skin every 12 (twelve) hours for 2 days. 2.4 mL 0   valACYclovir (VALTREX) 500 MG tablet Take 1 tablet (500 mg total) by mouth daily. Increase to bid x 3 days with symptoms 90 tablet 3   Current Facility-Administered Medications  Medication Dose Route Frequency Provider Last Rate Last Admin   triamcinolone acetonide (KENALOG) 10 MG/ML injection 10 mg  10 mg Other Once Delories Heinz, DPM        Family History  Problem Relation Age of Onset   Hypertension Mother    Skin cancer Mother    Colon polyps Mother    Valvular heart disease Mother    Kidney disease Mother    Diabetes Father    Kidney disease Father    Dementia Father    Factor VIII deficiency Father    Colon polyps  Brother    Bladder Cancer Maternal Grandmother        dx in late 40s; smoker   Stroke Maternal Grandmother    Diabetes Maternal Grandmother    Lung cancer Maternal Grandfather    Colon cancer Maternal Grandfather 25       origin   Thyroid cancer Maternal Grandfather        dx in his 35s   Breast cancer Paternal Grandmother        dx in 66s   Prostate cancer Other        MGMs brother    ROS: Constitutional: negative Genitourinary:negative  Exam:   BP (!) 113/56 (BP Location: Left Arm, Patient Position: Sitting, Cuff Size: Large)   Pulse 67   Ht 5\' 8"  (1.727 m) Comment: Reported  Wt 161 lb 6.4 oz (73.2 kg)   BMI 24.54 kg/m   Height: 5\' 8"  (172.7 cm) (Reported)  General appearance: alert, cooperative and appears stated age Head: Normocephalic, without obvious abnormality, atraumatic Neck: no adenopathy, supple, symmetrical, trachea midline and thyroid normal to inspection and palpation Lungs: clear to auscultation bilaterally Breasts: s/p mastectomy with implants and well healed scars.  Right breast capsule very tight with no movement of breast, no masses, skin changes, LAD Heart: regular rate and rhythm Abdomen: soft, non-tender; bowel sounds normal; no masses,  no organomegaly Extremities: extremities normal, atraumatic, no cyanosis or edema Skin: Skin color, texture, turgor normal. No rashes or lesions Lymph nodes: Cervical, supraclavicular, and axillary nodes normal. No abnormal inguinal nodes palpated Neurologic: Grossly normal   Pelvic: External genitalia:  no lesions              Urethra:  normal appearing urethra with no masses, tenderness or lesions              Bartholins and Skenes: normal  Vagina: normal appearing vagina with normal color and no discharge, no lesions              Cervix: no lesions              Pap taken: Yes.   Bimanual Exam:  Uterus:  normal size, contour, position, consistency, mobility, non-tender              Adnexa:  normal adnexa and no mass, fullness, tenderness               Rectovaginal: Confirms               Anus:  normal sphincter tone, no lesions  Chaperone, Ina Homes, CMA, was present for exam.  Assessment/Plan: 1. Well woman exam with routine gynecological exam - Pap smear (of vagina) neg with neg HR HPV 2021 - Mammogram not indicated due to bilateral mastectomy - Colonoscopy 10/02/2021, follow up 5 years - Bone mineral density 10/2019, -1.2  Recommended repeating this year due to anastrozole.  If not ordered by Dr. Pamelia Hoit with next appt, I will order when I see her next - lab work done with PCP, Dr. Donette Larry - vaccines reviewed/updated  Pap:  02/20/2019 Negative History of abnormal Pap:  h/o HPV MMG:  not indicated Colonoscopy:  10/02/2021, follow up 5 years BMD:   10/2019, -1.2 Screening Labs: does with PCP 2. HSV (herpes simplex virus) infection - valACYclovir (VALTREX) 500 MG tablet; Take 1 tablet (500 mg total) by mouth daily. Increase to bid x 3 days with symptoms  Dispense: 90 tablet; Refill: 3  3. Vaginal odor - Cervicovaginal ancillary only( [place])  4. History of abnormal cervical Pap smear - remote hx - has negative vaginal pap with neg HR HPV 2021.  Consider repeating x1 more to ensure negative HR HPV  5. Malignant neoplasm of right female breast, unspecified estrogen receptor status, unspecified site of breast (HCC) - 2015, then again 2022.  S/p bilateral mastectomy with implant placement.  Not significant capsule formation on right, planning removal - on anastrozole x 7 years, started 08/2020 - followed by Dr. Pamelia Hoit  7. Clotting disorder (HCC) - on Eliquis   8.  H/o of hysterectomy - then BSO two years later

## 2022-09-16 ENCOUNTER — Encounter (HOSPITAL_BASED_OUTPATIENT_CLINIC_OR_DEPARTMENT_OTHER): Payer: Self-pay | Admitting: Obstetrics & Gynecology

## 2022-09-16 DIAGNOSIS — Z124 Encounter for screening for malignant neoplasm of cervix: Secondary | ICD-10-CM | POA: Insufficient documentation

## 2022-09-17 ENCOUNTER — Encounter: Payer: Self-pay | Admitting: Student

## 2022-09-17 NOTE — H&P (View-Only) (Signed)
 Surgical Clearance has been received from patient's PCP, Dr. Donette Larry for patient's upcoming surgery with Dr. Ulice Bold.  Dr. Donette Larry is okay with patient holding Eliquis 3 days prior to surgery and restarting Eliquis the next day.    We have already received recommendations from patient's hematologist though, Dr. Pamelia Hoit, he will be bridging the patient with Lovenox prior to surgery. Patient is aware of Dr. Earmon Phoenix plan.

## 2022-09-17 NOTE — Progress Notes (Signed)
Surgical Clearance has been received from patient's PCP, Dr. Donette Larry for patient's upcoming surgery with Dr. Ulice Bold.  Dr. Donette Larry is okay with patient holding Eliquis 3 days prior to surgery and restarting Eliquis the next day.    We have already received recommendations from patient's hematologist though, Dr. Pamelia Hoit, he will be bridging the patient with Lovenox prior to surgery. Patient is aware of Dr. Earmon Phoenix plan.

## 2022-09-18 ENCOUNTER — Emergency Department (HOSPITAL_BASED_OUTPATIENT_CLINIC_OR_DEPARTMENT_OTHER)
Admission: EM | Admit: 2022-09-18 | Discharge: 2022-09-18 | Disposition: A | Discharge status: Home / Self Care | Payer: 59 | Attending: Emergency Medicine | Admitting: Emergency Medicine

## 2022-09-18 ENCOUNTER — Emergency Department (HOSPITAL_BASED_OUTPATIENT_CLINIC_OR_DEPARTMENT_OTHER): Payer: 59

## 2022-09-18 ENCOUNTER — Other Ambulatory Visit: Payer: Self-pay

## 2022-09-18 ENCOUNTER — Telehealth: Payer: Self-pay | Admitting: Student

## 2022-09-18 ENCOUNTER — Encounter (HOSPITAL_BASED_OUTPATIENT_CLINIC_OR_DEPARTMENT_OTHER): Payer: Self-pay

## 2022-09-18 ENCOUNTER — Telehealth: Payer: Self-pay | Admitting: Cardiology

## 2022-09-18 DIAGNOSIS — Z7901 Long term (current) use of anticoagulants: Secondary | ICD-10-CM | POA: Diagnosis not present

## 2022-09-18 DIAGNOSIS — W01198A Fall on same level from slipping, tripping and stumbling with subsequent striking against other object, initial encounter: Secondary | ICD-10-CM | POA: Diagnosis not present

## 2022-09-18 DIAGNOSIS — S0993XA Unspecified injury of face, initial encounter: Secondary | ICD-10-CM | POA: Diagnosis present

## 2022-09-18 DIAGNOSIS — W19XXXA Unspecified fall, initial encounter: Secondary | ICD-10-CM

## 2022-09-18 DIAGNOSIS — Y990 Civilian activity done for income or pay: Secondary | ICD-10-CM | POA: Insufficient documentation

## 2022-09-18 DIAGNOSIS — S0031XA Abrasion of nose, initial encounter: Secondary | ICD-10-CM | POA: Insufficient documentation

## 2022-09-18 DIAGNOSIS — S0083XA Contusion of other part of head, initial encounter: Secondary | ICD-10-CM

## 2022-09-18 MED ORDER — ACETAMINOPHEN 500 MG PO TABS
1000.0000 mg | ORAL_TABLET | Freq: Once | ORAL | Status: AC
  Administered 2022-09-18: 1000 mg via ORAL
  Filled 2022-09-18: qty 2

## 2022-09-18 NOTE — ED Provider Notes (Signed)
[facility] Provider Note   CSN: [id number] Arrival date & time: 09/18/22  1430     History  Chief Complaint  Patient presents with   Fall    Sierra Perez is a 62 y.o. female anticoagulated on Eliquis who presents to ED after mechanical fall about 3 hours ago.  She states that she was at work when she tripped and hit the right side of her face.  No LOC.  No preceding symptoms.  States that she has a mild right-sided headache since the fall but no nausea, vomiting, vision change, focal weakness, or changes in her gait.  Tetanus within 5 years.      Home Medications Prior to Admission medications   Medication Sig Start Date End Date Taking? Authorizing Provider  acetaminophen (TYLENOL) 500 MG tablet Take 1,000 mg by mouth every 6 (six) hours as needed for moderate pain.    [provider]  albuterol (VENTOLIN HFA) 108 (90 Base) MCG/ACT inhaler Inhale 1 puff into the lungs every 6 (six) hours as needed for wheezing or shortness of breath. 01/18/19   [provider]  amiodarone (PACERONE) 200 MG tablet Take 1 tablet (200 mg total) by mouth daily. 07/23/22   Duke, Roe Rutherford, PA  anastrozole (ARIMIDEX) 1 MG tablet TAKE 1 TABLET(1 MG) BY MOUTH DAILY Patient taking differently: Take 1 mg by mouth daily. 02/19/22   [name], MD  apixaban (ELIQUIS) 5 MG TABS tablet Take 1 tablet (5 mg total) by mouth 2 (two) times daily. Resume 07/22/22 PM dose. 07/22/22   D[facility], Roe Rutherford, PA  Bacillus Coagulans-Inulin (PROBIOTIC) 1-250 BILLION-MG CAPS Take 250 mg by mouth daily. 05/27/20   [provider]  buPROPion (WELLBUTRIN XL) 150 MG 24 hr tablet Take 150 mg by mouth daily.    [provider]  CALCIUM CITRATE PO Take 3 tablets by mouth daily.    [provider]  Cholecalciferol (VITAMIN D-3) 25 MCG (1000 UT) CAPS Take 1,000 Units by mouth daily.    [provider]  colchicine 0.6 MG tablet Take 1  tablet (0.6 mg total) by mouth 2 (two) times daily. Patient taking differently: Take 0.6 mg by mouth daily. 07/22/22   Duke, Roe Rutherford, PA  DULoxetine (CYMBALTA) 60 MG capsule Take 60 mg by mouth daily.    [provider]  enoxaparin (LOVENOX) 60 MG/0.6ML injection Inject 0.6 mLs (60 mg total) into the skin every 12 (twelve) hours for 2 days. 09/10/22 09/12/22  [name], MD  fluticasone (FLONASE) 50 MCG/ACT nasal spray Place 2 sprays into both nostrils daily.    [provider]  gabapentin (NEURONTIN) 600 MG tablet TAKE 1 TABLET(600 MG) BY MOUTH THREE TIMES DAILY Patient taking differently: Take 600 mg by mouth 2 (two) times daily. 04/21/22   Kirtland Bouchard, PA-C  ipratropium (ATROVENT) 0.03 % nasal spray Place 2 sprays into both nostrils 2 (two) times daily. 05/21/19   [provider]  Lactobacillus Rhamnosus, GG, (CULTURELLE PO) Take 1 capsule by mouth daily.    [provider]  levothyroxine (SYNTHROID) 50 MCG tablet Take 50 mcg by mouth every morning. 05/18/19   [provider]  ondansetron (ZOFRAN) 4 MG tablet Take 1 tablet (4 mg total) by mouth every 8 (eight) hours as needed for up to 20 doses for nausea or vomiting. 09/09/22   Laurena Spies, PA-C  oxyCODONE (ROXICODONE) 5 MG immediate release tablet Take 1 tablet (5 mg total) by mouth  every 8 (eight) hours as needed for up to 20 doses for severe pain. 09/09/22   Laurena Spies, PA-C  pantoprazole (PROTONIX) 40 MG tablet Take 1 tablet (40 mg total) by mouth 2 (two) times daily. 07/22/22   Duke, Roe Rutherford, PA  rosuvastatin (CRESTOR) 5 MG tablet Take 1 tablet (5 mg total) by mouth daily. 04/16/22   Bensimhon, Bevelyn Buckles, MD  tirzepatide [facility]) 12.5 MG/0.5ML Pen Inject 12.5 mg into the skin once a week. 08/04/22   [name], MD  valACYclovir (VALTREX) 500 MG tablet Take 1 tablet (500 mg total) by mouth daily. Increase to bid x 3 days with symptoms 09/14/22   [name], MD       Allergies    Succinylcholine, Azithromycin, Keflex [cephalexin], Silver, Sulfa antibiotics, and Sulfasalazine    Review of Systems   Review of Systems  All other systems reviewed and are negative.   Physical Exam Updated Vital Signs BP 127/69   Pulse 69   Temp 97.8 F (36.6 C) (Oral)   Resp 19   Ht 5\' 8"  (1.727 m)   Wt 73 kg   SpO2 98%   BMI 24.47 kg/m  Physical Exam Vitals and nursing note reviewed.  Constitutional:      General: She is not in acute distress.    Appearance: Normal appearance.  HENT:     Head: Normocephalic.     Comments: Superficial abrasion to the bridge of the nose, no active bleeding    Mouth/Throat:     Mouth: Mucous membranes are moist.  Eyes:     Extraocular Movements: Extraocular movements intact.     Conjunctiva/sclera: Conjunctivae normal.     Pupils: Pupils are equal, round, and reactive to light.  Cardiovascular:     Rate and Rhythm: Normal rate and regular rhythm.     Heart sounds: No murmur heard. Pulmonary:     Effort: Pulmonary effort is normal.     Breath sounds: Normal breath sounds.  Abdominal:     General: Abdomen is flat.     Palpations: Abdomen is soft.     Tenderness: There is no abdominal tenderness.  Musculoskeletal:        General: Normal range of motion.     Cervical back: Normal range of motion and neck supple.     Right lower leg: No edema.     Left lower leg: No edema.     Comments: No midline CTL spinal tenderness, step-offs, or deformities, moving all extremities spontaneously and equally x 4  Skin:    General: Skin is warm and dry.     Capillary Refill: Capillary refill takes less than 2 seconds.  Neurological:     Mental Status: She is alert and oriented to person, place, and time.     GCS: GCS eye subscore is 4. GCS verbal subscore is 5. GCS motor subscore is 6.     Cranial Nerves: Cranial nerves 2-12 are intact. No cranial nerve deficit, dysarthria or facial asymmetry.     Sensory: Sensation is intact.      Motor: Motor function is intact. No weakness, tremor, atrophy or abnormal muscle tone.     Coordination: Coordination is intact.  Psychiatric:        Behavior: Behavior normal.     ED Results / Procedures / Treatments   Labs (all labs ordered are listed, but only abnormal results are displayed) Labs Reviewed - No data to display  EKG None  Radiology CT  Head Wo Contrast  Result Date: 09/18/2022 CLINICAL DATA:  Head trauma, moderate-severe; Facial trauma, blunt; Neck trauma, impaired ROM (Age 69-64y). EXAM: CT HEAD WITHOUT CONTRAST CT MAXILLOFACIAL WITHOUT CONTRAST CT CERVICAL SPINE WITHOUT CONTRAST TECHNIQUE: Multidetector CT imaging of the head, cervical spine, and maxillofacial structures were performed using the standard protocol without intravenous contrast. Multiplanar CT image reconstructions of the cervical spine and maxillofacial structures were also generated. RADIATION DOSE REDUCTION: This exam was performed according to the departmental dose-optimization program which includes automated exposure control, adjustment of the mA and/or kV according to patient size and/or use of iterative reconstruction technique. COMPARISON:  Head CT 01/19/2018. FINDINGS: CT HEAD FINDINGS Brain: No acute intracranial hemorrhage. Gray-white differentiation is preserved. No hydrocephalus or extra-axial collection. No mass effect or midline shift. Vascular: No hyperdense vessel or unexpected calcification. Skull: No calvarial fracture or suspicious bone lesion. Skull base is unremarkable. Other: None. CT MAXILLOFACIAL FINDINGS Osseous: No fracture or mandibular dislocation. No destructive process. Orbits: Negative. No traumatic or inflammatory finding. Sinuses: Clear. Soft tissues: Unremarkable. CT CERVICAL SPINE FINDINGS Alignment: Normal. Skull base and vertebrae: No acute fracture. Normal craniocervical junction. No suspicious bone lesions. Soft tissues and spinal canal: No prevertebral fluid or  swelling. No visible canal hematoma. Disc levels: Mild cervical spondylosis without high-grade spinal canal stenosis. Upper chest: No acute findings. Other: None. IMPRESSION: 1. No acute intracranial abnormality. 2. No acute facial bone fracture. 3. No acute cervical spine fracture or traumatic listhesis. Electronically Signed   By: Orvan Falconer M.D.   On: 09/18/2022 17:38   CT Cervical Spine Wo Contrast  Result Date: 09/18/2022 CLINICAL DATA:  Head trauma, moderate-severe; Facial trauma, blunt; Neck trauma, impaired ROM (Age 59-64y). EXAM: CT HEAD WITHOUT CONTRAST CT MAXILLOFACIAL WITHOUT CONTRAST CT CERVICAL SPINE WITHOUT CONTRAST TECHNIQUE: Multidetector CT imaging of the head, cervical spine, and maxillofacial structures were performed using the standard protocol without intravenous contrast. Multiplanar CT image reconstructions of the cervical spine and maxillofacial structures were also generated. RADIATION DOSE REDUCTION: This exam was performed according to the departmental dose-optimization program which includes automated exposure control, adjustment of the mA and/or kV according to patient size and/or use of iterative reconstruction technique. COMPARISON:  Head CT 01/19/2018. FINDINGS: CT HEAD FINDINGS Brain: No acute intracranial hemorrhage. Gray-white differentiation is preserved. No hydrocephalus or extra-axial collection. No mass effect or midline shift. Vascular: No hyperdense vessel or unexpected calcification. Skull: No calvarial fracture or suspicious bone lesion. Skull base is unremarkable. Other: None. CT MAXILLOFACIAL FINDINGS Osseous: No fracture or mandibular dislocation. No destructive process. Orbits: Negative. No traumatic or inflammatory finding. Sinuses: Clear. Soft tissues: Unremarkable. CT CERVICAL SPINE FINDINGS Alignment: Normal. Skull base and vertebrae: No acute fracture. Normal craniocervical junction. No suspicious bone lesions. Soft tissues and spinal canal: No  prevertebral fluid or swelling. No visible canal hematoma. Disc levels: Mild cervical spondylosis without high-grade spinal canal stenosis. Upper chest: No acute findings. Other: None. IMPRESSION: 1. No acute intracranial abnormality. 2. No acute facial bone fracture. 3. No acute cervical spine fracture or traumatic listhesis. Electronically Signed   By: Orvan Falconer M.D.   On: 09/18/2022 17:38   CT Maxillofacial Wo Contrast  Result Date: 09/18/2022 CLINICAL DATA:  Head trauma, moderate-severe; Facial trauma, blunt; Neck trauma, impaired ROM (Age 48-64y). EXAM: CT HEAD WITHOUT CONTRAST CT MAXILLOFACIAL WITHOUT CONTRAST CT CERVICAL SPINE WITHOUT CONTRAST TECHNIQUE: Multidetector CT imaging of the head, cervical spine, and maxillofacial structures were performed using the standard protocol without intravenous contrast. Multiplanar CT image reconstructions  of the cervical spine and maxillofacial structures were also generated. RADIATION DOSE REDUCTION: This exam was performed according to the departmental dose-optimization program which includes automated exposure control, adjustment of the mA and/or kV according to patient size and/or use of iterative reconstruction technique. COMPARISON:  Head CT 01/19/2018. FINDINGS: CT HEAD FINDINGS Brain: No acute intracranial hemorrhage. Gray-white differentiation is preserved. No hydrocephalus or extra-axial collection. No mass effect or midline shift. Vascular: No hyperdense vessel or unexpected calcification. Skull: No calvarial fracture or suspicious bone lesion. Skull base is unremarkable. Other: None. CT MAXILLOFACIAL FINDINGS Osseous: No fracture or mandibular dislocation. No destructive process. Orbits: Negative. No traumatic or inflammatory finding. Sinuses: Clear. Soft tissues: Unremarkable. CT CERVICAL SPINE FINDINGS Alignment: Normal. Skull base and vertebrae: No acute fracture. Normal craniocervical junction. No suspicious bone lesions. Soft tissues and spinal  canal: No prevertebral fluid or swelling. No visible canal hematoma. Disc levels: Mild cervical spondylosis without high-grade spinal canal stenosis. Upper chest: No acute findings. Other: None. IMPRESSION: 1. No acute intracranial abnormality. 2. No acute facial bone fracture. 3. No acute cervical spine fracture or traumatic listhesis. Electronically Signed   By: Orvan Falconer M.D.   On: 09/18/2022 17:38    Procedures Procedures    Medications Ordered in ED Medications  acetaminophen (TYLENOL) tablet 1,000 mg (1,000 mg Oral Given 09/18/22 1543)    ED Course/ Medical Decision Making/ A&P                                 Medical Decision Making Amount and/or Complexity of Data Reviewed Radiology: ordered. Decision-making details documented in ED Course.  Risk OTC drugs.   Medical Decision Making:   Larenda Feast is a 62 y.o. female who presented to the ED today with head injury detailed above.    Patient's presentation is complicated by their history of age, anticoagulant use.  Complete initial physical exam performed, notably the patient was neurologically intact.    Reviewed and confirmed nursing documentation for past medical history, family history, social history.    Initial Assessment:   With the patient's presentation, differential diagnosis includes but is not limited to closed head injury, ICH/SAH, concussion, spinal injury, contusion, fracture/dislocation.  This is most consistent with an acute complicated illness  Initial Plan:  CT brain, MXF, C spine to assess for traumatic injuries Objective evaluation as below reviewed   Initial Study Results:   Radiology:  All images reviewed independently. Agree with radiology report at this time.   CT Head Wo Contrast  Result Date: 09/18/2022 CLINICAL DATA:  Head trauma, moderate-severe; Facial trauma, blunt; Neck trauma, impaired ROM (Age 97-64y). EXAM: CT HEAD WITHOUT CONTRAST CT MAXILLOFACIAL WITHOUT CONTRAST CT CERVICAL  SPINE WITHOUT CONTRAST TECHNIQUE: Multidetector CT imaging of the head, cervical spine, and maxillofacial structures were performed using the standard protocol without intravenous contrast. Multiplanar CT image reconstructions of the cervical spine and maxillofacial structures were also generated. RADIATION DOSE REDUCTION: This exam was performed according to the departmental dose-optimization program which includes automated exposure control, adjustment of the mA and/or kV according to patient size and/or use of iterative reconstruction technique. COMPARISON:  Head CT 01/19/2018. FINDINGS: CT HEAD FINDINGS Brain: No acute intracranial hemorrhage. Gray-white differentiation is preserved. No hydrocephalus or extra-axial collection. No mass effect or midline shift. Vascular: No hyperdense vessel or unexpected calcification. Skull: No calvarial fracture or suspicious bone lesion. Skull base is unremarkable. Other: None. CT MAXILLOFACIAL FINDINGS Osseous: No  fracture or mandibular dislocation. No destructive process. Orbits: Negative. No traumatic or inflammatory finding. Sinuses: Clear. Soft tissues: Unremarkable. CT CERVICAL SPINE FINDINGS Alignment: Normal. Skull base and vertebrae: No acute fracture. Normal craniocervical junction. No suspicious bone lesions. Soft tissues and spinal canal: No prevertebral fluid or swelling. No visible canal hematoma. Disc levels: Mild cervical spondylosis without high-grade spinal canal stenosis. Upper chest: No acute findings. Other: None. IMPRESSION: 1. No acute intracranial abnormality. 2. No acute facial bone fracture. 3. No acute cervical spine fracture or traumatic listhesis. Electronically Signed   By: Orvan Falconer M.D.   On: 09/18/2022 17:38   CT Cervical Spine Wo Contrast  Result Date: 09/18/2022 CLINICAL DATA:  Head trauma, moderate-severe; Facial trauma, blunt; Neck trauma, impaired ROM (Age 62-64y). EXAM: CT HEAD WITHOUT CONTRAST CT MAXILLOFACIAL WITHOUT CONTRAST  CT CERVICAL SPINE WITHOUT CONTRAST TECHNIQUE: Multidetector CT imaging of the head, cervical spine, and maxillofacial structures were performed using the standard protocol without intravenous contrast. Multiplanar CT image reconstructions of the cervical spine and maxillofacial structures were also generated. RADIATION DOSE REDUCTION: This exam was performed according to the departmental dose-optimization program which includes automated exposure control, adjustment of the mA and/or kV according to patient size and/or use of iterative reconstruction technique. COMPARISON:  Head CT 01/19/2018. FINDINGS: CT HEAD FINDINGS Brain: No acute intracranial hemorrhage. Gray-white differentiation is preserved. No hydrocephalus or extra-axial collection. No mass effect or midline shift. Vascular: No hyperdense vessel or unexpected calcification. Skull: No calvarial fracture or suspicious bone lesion. Skull base is unremarkable. Other: None. CT MAXILLOFACIAL FINDINGS Osseous: No fracture or mandibular dislocation. No destructive process. Orbits: Negative. No traumatic or inflammatory finding. Sinuses: Clear. Soft tissues: Unremarkable. CT CERVICAL SPINE FINDINGS Alignment: Normal. Skull base and vertebrae: No acute fracture. Normal craniocervical junction. No suspicious bone lesions. Soft tissues and spinal canal: No prevertebral fluid or swelling. No visible canal hematoma. Disc levels: Mild cervical spondylosis without high-grade spinal canal stenosis. Upper chest: No acute findings. Other: None. IMPRESSION: 1. No acute intracranial abnormality. 2. No acute facial bone fracture. 3. No acute cervical spine fracture or traumatic listhesis. Electronically Signed   By: Orvan Falconer M.D.   On: 09/18/2022 17:38   CT Maxillofacial Wo Contrast  Result Date: 09/18/2022 CLINICAL DATA:  Head trauma, moderate-severe; Facial trauma, blunt; Neck trauma, impaired ROM (Age 13-64y). EXAM: CT HEAD WITHOUT CONTRAST CT MAXILLOFACIAL  WITHOUT CONTRAST CT CERVICAL SPINE WITHOUT CONTRAST TECHNIQUE: Multidetector CT imaging of the head, cervical spine, and maxillofacial structures were performed using the standard protocol without intravenous contrast. Multiplanar CT image reconstructions of the cervical spine and maxillofacial structures were also generated. RADIATION DOSE REDUCTION: This exam was performed according to the departmental dose-optimization program which includes automated exposure control, adjustment of the mA and/or kV according to patient size and/or use of iterative reconstruction technique. COMPARISON:  Head CT 01/19/2018. FINDINGS: CT HEAD FINDINGS Brain: No acute intracranial hemorrhage. Gray-white differentiation is preserved. No hydrocephalus or extra-axial collection. No mass effect or midline shift. Vascular: No hyperdense vessel or unexpected calcification. Skull: No calvarial fracture or suspicious bone lesion. Skull base is unremarkable. Other: None. CT MAXILLOFACIAL FINDINGS Osseous: No fracture or mandibular dislocation. No destructive process. Orbits: Negative. No traumatic or inflammatory finding. Sinuses: Clear. Soft tissues: Unremarkable. CT CERVICAL SPINE FINDINGS Alignment: Normal. Skull base and vertebrae: No acute fracture. Normal craniocervical junction. No suspicious bone lesions. Soft tissues and spinal canal: No prevertebral fluid or swelling. No visible canal hematoma. Disc levels: Mild cervical spondylosis without high-grade spinal  canal stenosis. Upper chest: No acute findings. Other: None. IMPRESSION: 1. No acute intracranial abnormality. 2. No acute facial bone fracture. 3. No acute cervical spine fracture or traumatic listhesis. Electronically Signed   By: Orvan Falconer M.D.   On: 09/18/2022 17:38   ECHOCARDIOGRAM LIMITED  Result Date: 09/07/2022    ECHOCARDIOGRAM LIMITED REPORT   Patient Name:   Jeanie Lett   Date of Exam: 09/07/2022 Medical Rec #:  [id number]     Height:       68.0 in Accession #:     [id number]    Weight:       164.0 lb Date of Birth:  18-Aug-1960    BSA:          1.878 m Patient Age:    61 years      BP:           129/81 mmHg Patient Gender: F             HR:           69 bpm. Exam Location:  [facility] Procedure: 2D Echo, Limited Echo, Limited Color Doppler and Cardiac Doppler Indications:    I31.3 Pericardial Effusion                  LIMITED ECHO FOR PERICARDIAL EFFUSION  History:        Patient has prior history of Echocardiogram examinations, most                 recent 07/30/2022. Signs/Symptoms:Murmur; Risk                 Factors:Hypertension. History of breast cancer.  Sonographer:    NaTashia Rodgers-Jones RDCS Referring Phys: [id number] EMILY C MONGE IMPRESSIONS  1. Limited echo for pericardial effusion  2. Left ventricular ejection fraction, by estimation, is 65 to 70%. Left ventricular ejection fraction by PLAX is 68 %. The left ventricle has normal function.  3. There is no longer any pericardial effusion Comparison(s): Changes from prior study are noted. 07/30/2022: LVEF 60-65%, small pericardial effusion reported, however, by my review it is trivial. FINDINGS  Left Ventricle: Left ventricular ejection fraction, by estimation, is 65 to 70%. Left ventricular ejection fraction by PLAX is 68 %. The left ventricle has normal function. Pericardium: There is no evidence of pericardial effusion. Presence of epicardial fat layer. Aortic Valve: Aortic regurgitation PHT measures 544 msec. LEFT VENTRICLE PLAX 2D LV EF:         Left            Diastology                ventricular     LV e' medial:    8.16 cm/s                ejection        LV E/e' medial:  7.8                fraction by     LV e' lateral:   14.60 cm/s                PLAX is 68      LV E/e' lateral: 4.4                %. LVIDd:         5.00 cm LVIDs:         3.10 cm LV PW:  0.60 cm LV IVS:        0.60 cm LVOT diam:     1.90 cm LV SV:         61 LV SV Index:   32 LVOT Area:     2.84 cm  RIGHT VENTRICLE RV S prime:      14.50 cm/s TAPSE (M-mode): 2.4 cm LEFT ATRIUM         Index LA diam:    4.80 cm 2.56 cm/m  AORTIC VALVE LVOT Vmax:   110.50 cm/s LVOT Vmean:  69.550 cm/s LVOT VTI:    0.214 m AI PHT:      544 msec  AORTA Ao Root diam: 2.80 cm MITRAL VALVE MV Area (PHT): 3.75 cm    SHUNTS MV Decel Time: 203 msec    Systemic VTI:  0.21 m MV E velocity: 63.65 cm/s  Systemic Diam: 1.90 cm MV A velocity: 62.60 cm/s MV E/A ratio:  1.02 Zoila Shutter MD Electronically signed by Zoila Shutter MD Signature Date/Time: 09/07/2022/1:53:25 PM    Final    Cardiac event monitor  Result Date: 09/06/2022 SR/SB/ST No signif arrhythmias      Final Assessment and Plan:   62 year old female presenting with mechanical fall.  She is anticoagulated.  No preceding presyncopal symptoms.  Does have slight headache following fall but no severe headache, nausea, vision changes, focal weakness.  No signs of severe head trauma though she does have a small abrasion to the bridge of the nose.  Given anticoagulation status, discussed risk/benefits of obtaining imaging with patient and she is agreeable to proceed.  CT brain, maxillofacial, C-spine with no acute changes.  Patient has remained neurologically intact throughout ED stay.  Discussed findings with patient as well as red flag symptoms that signal need to return.  Patient expressed understanding and agreement with plan.  Will follow-up and return if needed.  Strict ED return precautions given, all questions answered, and stable for discharge.   Clinical Impression:  1. Fall, initial encounter   2. Contusion of face, initial encounter      Discharge           Final Clinical Impression(s) / ED Diagnoses Final diagnoses:  Fall, initial encounter  Contusion of face, initial encounter    Rx / DC Orders ED Discharge Orders     None         Richardson Dopp 09/18/22 2035    Glyn Ade, MD 09/18/22 2214

## 2022-09-18 NOTE — Progress Notes (Signed)
Patient sent text reminder to pick up pre-surgical soap.

## 2022-09-18 NOTE — Telephone Encounter (Signed)
Caller is following-up on patient's clearance to hold her medication for surgery scheduled on Monday, 8/19.  Caller stated can reach her on her cell phone# [phone].

## 2022-09-18 NOTE — ED Triage Notes (Signed)
Patient fell today and hit her face on the floor today. She denied LOC. She is having pain to her right cheek and headache. She does take a blood thinner.

## 2022-09-18 NOTE — Telephone Encounter (Signed)
I sent a secure chat to Dr. Ulice Bold and informing the pt has been cleared and the clearance notes are in EPIC.

## 2022-09-18 NOTE — ED Notes (Signed)
Discharge instructions reviewed with patient. Patient questions answered and opportunity for education reviewed. Patient voices understanding of discharge instructions with no further questions. Patient ambulatory with steady gait to lobby.  

## 2022-09-18 NOTE — Telephone Encounter (Signed)
Called patient to tell her that we cardiology gave clearance on 09/07/2022 to proceed with surgery at an acceptable risk.  Patient expressed understanding.  I also discussed with the patient that Dr. Ulice Bold was okay with her staying the night.  Patient expressed understanding.  Patient also states that she just fell and hit her face.  She was wondering if she should be evaluated.  I did recommend she go to the emergency room given that she is on Eliquis as this could put her at increased risk of internal bleeding.  Patient expressed understanding.

## 2022-09-18 NOTE — Discharge Instructions (Signed)
Thank you for letting us take care of you today.  Your scans were normal. Please follow up with your PCP as needed. For new injuries or worsening symptoms including severe headache, vision changes, weakness on one side of your body, or other new symptoms or concerns, return to nearest ED for re-evaluation.

## 2022-09-19 ENCOUNTER — Other Ambulatory Visit (HOSPITAL_BASED_OUTPATIENT_CLINIC_OR_DEPARTMENT_OTHER): Payer: Self-pay

## 2022-09-19 ENCOUNTER — Other Ambulatory Visit (HOSPITAL_COMMUNITY): Payer: Self-pay

## 2022-09-21 ENCOUNTER — Other Ambulatory Visit: Payer: Self-pay

## 2022-09-21 ENCOUNTER — Ambulatory Visit (HOSPITAL_BASED_OUTPATIENT_CLINIC_OR_DEPARTMENT_OTHER): Payer: 59 | Admitting: Anesthesiology

## 2022-09-21 ENCOUNTER — Encounter (HOSPITAL_BASED_OUTPATIENT_CLINIC_OR_DEPARTMENT_OTHER): Payer: Self-pay | Admitting: Plastic Surgery

## 2022-09-21 ENCOUNTER — Encounter (HOSPITAL_BASED_OUTPATIENT_CLINIC_OR_DEPARTMENT_OTHER)
Admission: RE | Disposition: A | Discharge status: Home / Self Care | Payer: Self-pay | Source: Home / Self Care | Attending: Plastic Surgery

## 2022-09-21 ENCOUNTER — Other Ambulatory Visit (HOSPITAL_BASED_OUTPATIENT_CLINIC_OR_DEPARTMENT_OTHER): Payer: Self-pay

## 2022-09-21 ENCOUNTER — Observation Stay (HOSPITAL_BASED_OUTPATIENT_CLINIC_OR_DEPARTMENT_OTHER)
Admission: RE | Admit: 2022-09-21 | Discharge: 2022-09-22 | Disposition: A | Discharge status: Home / Self Care | Payer: 59 | Attending: Plastic Surgery | Admitting: Plastic Surgery

## 2022-09-21 DIAGNOSIS — Z79899 Other long term (current) drug therapy: Secondary | ICD-10-CM | POA: Insufficient documentation

## 2022-09-21 DIAGNOSIS — Z45811 Encounter for adjustment or removal of right breast implant: Secondary | ICD-10-CM | POA: Diagnosis not present

## 2022-09-21 DIAGNOSIS — Z9011 Acquired absence of right breast and nipple: Principal | ICD-10-CM | POA: Insufficient documentation

## 2022-09-21 DIAGNOSIS — Z96641 Presence of right artificial hip joint: Secondary | ICD-10-CM | POA: Insufficient documentation

## 2022-09-21 DIAGNOSIS — Z7901 Long term (current) use of anticoagulants: Secondary | ICD-10-CM | POA: Insufficient documentation

## 2022-09-21 DIAGNOSIS — C50919 Malignant neoplasm of unspecified site of unspecified female breast: Secondary | ICD-10-CM | POA: Diagnosis present

## 2022-09-21 DIAGNOSIS — I10 Essential (primary) hypertension: Secondary | ICD-10-CM | POA: Diagnosis not present

## 2022-09-21 DIAGNOSIS — I4891 Unspecified atrial fibrillation: Secondary | ICD-10-CM | POA: Insufficient documentation

## 2022-09-21 DIAGNOSIS — E039 Hypothyroidism, unspecified: Secondary | ICD-10-CM | POA: Insufficient documentation

## 2022-09-21 DIAGNOSIS — J45909 Unspecified asthma, uncomplicated: Secondary | ICD-10-CM | POA: Diagnosis not present

## 2022-09-21 DIAGNOSIS — Z853 Personal history of malignant neoplasm of breast: Secondary | ICD-10-CM

## 2022-09-21 DIAGNOSIS — Z421 Encounter for breast reconstruction following mastectomy: Secondary | ICD-10-CM

## 2022-09-21 DIAGNOSIS — Z96653 Presence of artificial knee joint, bilateral: Secondary | ICD-10-CM | POA: Insufficient documentation

## 2022-09-21 DIAGNOSIS — E119 Type 2 diabetes mellitus without complications: Secondary | ICD-10-CM | POA: Diagnosis not present

## 2022-09-21 DIAGNOSIS — Z86718 Personal history of other venous thrombosis and embolism: Secondary | ICD-10-CM | POA: Insufficient documentation

## 2022-09-21 HISTORY — PX: BREAST IMPLANT REMOVAL: SHX5361

## 2022-09-21 HISTORY — DX: Cardiac arrhythmia, unspecified: I49.9

## 2022-09-21 LAB — GLUCOSE, CAPILLARY
Glucose-Capillary: 76 mg/dL (ref 70–99)
Glucose-Capillary: 87 mg/dL (ref 70–99)

## 2022-09-21 SURGERY — REMOVAL, IMPLANT, BREAST
Anesthesia: General | Site: Breast | Laterality: Right

## 2022-09-21 MED ORDER — CHLORHEXIDINE GLUCONATE CLOTH 2 % EX PADS: 6.0000 | MEDICATED_PAD | Freq: Once | CUTANEOUS | Status: DC

## 2022-09-21 MED ORDER — FENTANYL CITRATE (PF) 100 MCG/2ML IJ SOLN
INTRAMUSCULAR | Status: AC
  Filled 2022-09-21: qty 2

## 2022-09-21 MED ORDER — EPHEDRINE SULFATE-NACL 50-0.9 MG/10ML-% IV SOSY
PREFILLED_SYRINGE | INTRAVENOUS | Status: DC | PRN
  Administered 2022-09-21: 5 mg via INTRAVENOUS

## 2022-09-21 MED ORDER — KCL IN DEXTROSE-NACL 20-5-0.45 MEQ/L-%-% IV SOLN
INTRAVENOUS | Status: DC
  Filled 2022-09-21: qty 1000

## 2022-09-21 MED ORDER — DEXMEDETOMIDINE HCL IN NACL 80 MCG/20ML IV SOLN
INTRAVENOUS | Status: AC
  Filled 2022-09-21: qty 20

## 2022-09-21 MED ORDER — PROPOFOL 10 MG/ML IV BOLUS
INTRAVENOUS | Status: DC | PRN
  Administered 2022-09-21: 50 mg via INTRAVENOUS
  Administered 2022-09-21: 150 mg via INTRAVENOUS

## 2022-09-21 MED ORDER — ACETAMINOPHEN 500 MG PO TABS
ORAL_TABLET | ORAL | Status: AC
  Filled 2022-09-21: qty 2

## 2022-09-21 MED ORDER — OXYCODONE HCL 5 MG PO TABS: 5.0000 mg | ORAL_TABLET | Freq: Once | ORAL | Status: DC | PRN

## 2022-09-21 MED ORDER — OXYCODONE HCL 5 MG/5ML PO SOLN: 5.0000 mg | Freq: Once | ORAL | Status: DC | PRN

## 2022-09-21 MED ORDER — ONDANSETRON 4 MG PO TBDP: 4.0000 mg | ORAL_TABLET | Freq: Four times a day (QID) | ORAL | Status: DC | PRN

## 2022-09-21 MED ORDER — OXYCODONE HCL 5 MG PO TABS: 5.0000 mg | ORAL_TABLET | ORAL | Status: DC | PRN

## 2022-09-21 MED ORDER — LIDOCAINE 2% (20 MG/ML) 5 ML SYRINGE
INTRAMUSCULAR | Status: AC
  Filled 2022-09-21: qty 15

## 2022-09-21 MED ORDER — CIPROFLOXACIN IN D5W 400 MG/200ML IV SOLN
INTRAVENOUS | Status: AC
  Filled 2022-09-21: qty 200

## 2022-09-21 MED ORDER — DEXAMETHASONE SODIUM PHOSPHATE 10 MG/ML IJ SOLN
INTRAMUSCULAR | Status: DC | PRN
  Administered 2022-09-21: 10 mg via INTRAVENOUS

## 2022-09-21 MED ORDER — LACTATED RINGERS IV SOLN: INTRAVENOUS | Status: DC

## 2022-09-21 MED ORDER — BUPIVACAINE LIPOSOME 1.3 % IJ SUSP
INTRAMUSCULAR | Status: AC
  Filled 2022-09-21: qty 20

## 2022-09-21 MED ORDER — DIPHENHYDRAMINE HCL 50 MG/ML IJ SOLN: 12.5000 mg | Freq: Four times a day (QID) | INTRAMUSCULAR | Status: DC | PRN

## 2022-09-21 MED ORDER — KETOROLAC TROMETHAMINE 30 MG/ML IJ SOLN
INTRAMUSCULAR | Status: AC
  Filled 2022-09-21: qty 4

## 2022-09-21 MED ORDER — EPHEDRINE 5 MG/ML INJ
INTRAVENOUS | Status: AC
  Filled 2022-09-21: qty 5

## 2022-09-21 MED ORDER — HYDROMORPHONE HCL 1 MG/ML IJ SOLN: 1.0000 mg | INTRAMUSCULAR | Status: DC | PRN

## 2022-09-21 MED ORDER — ONDANSETRON HCL 4 MG/2ML IJ SOLN: 4.0000 mg | Freq: Four times a day (QID) | INTRAMUSCULAR | Status: DC | PRN

## 2022-09-21 MED ORDER — MIDAZOLAM HCL 2 MG/2ML IJ SOLN
INTRAMUSCULAR | Status: AC
  Filled 2022-09-21: qty 2

## 2022-09-21 MED ORDER — ACETAMINOPHEN 500 MG PO TABS
1000.0000 mg | ORAL_TABLET | Freq: Once | ORAL | Status: AC
  Administered 2022-09-21: 1000 mg via ORAL

## 2022-09-21 MED ORDER — PROPOFOL 10 MG/ML IV BOLUS
INTRAVENOUS | Status: AC
  Filled 2022-09-21: qty 20

## 2022-09-21 MED ORDER — ONDANSETRON HCL 4 MG/2ML IJ SOLN
INTRAMUSCULAR | Status: DC | PRN
Start: 2022-09-21 — End: 2022-09-21
  Administered 2022-09-21: 4 mg via INTRAVENOUS

## 2022-09-21 MED ORDER — MIDAZOLAM HCL 2 MG/2ML IJ SOLN
INTRAMUSCULAR | Status: DC | PRN
  Administered 2022-09-21: 2 mg via INTRAVENOUS

## 2022-09-21 MED ORDER — POLYETHYLENE GLYCOL 3350 17 G PO PACK: 17.0000 g | PACK | Freq: Every day | ORAL | Status: DC | PRN

## 2022-09-21 MED ORDER — CIPROFLOXACIN IN D5W 400 MG/200ML IV SOLN
400.0000 mg | Freq: Two times a day (BID) | INTRAVENOUS | Status: DC
  Administered 2022-09-22: 400 mg via INTRAVENOUS
  Filled 2022-09-21: qty 200

## 2022-09-21 MED ORDER — FENTANYL CITRATE (PF) 100 MCG/2ML IJ SOLN
INTRAMUSCULAR | Status: DC | PRN
  Administered 2022-09-21 (×2): 50 ug via INTRAVENOUS

## 2022-09-21 MED ORDER — HYDROMORPHONE HCL 1 MG/ML IJ SOLN: 0.2500 mg | INTRAMUSCULAR | Status: DC | PRN

## 2022-09-21 MED ORDER — ONDANSETRON HCL 4 MG/2ML IJ SOLN: 4.0000 mg | Freq: Once | INTRAMUSCULAR | Status: DC | PRN

## 2022-09-21 MED ORDER — LIDOCAINE 2% (20 MG/ML) 5 ML SYRINGE
INTRAMUSCULAR | Status: DC | PRN
  Administered 2022-09-21: 100 mg via INTRAVENOUS

## 2022-09-21 MED ORDER — VASHE WOUND IRRIGATION OPTIME
TOPICAL | Status: DC | PRN
  Administered 2022-09-21: 34 [oz_av] via TOPICAL

## 2022-09-21 MED ORDER — TRAMADOL HCL 50 MG PO TABS: 50.0000 mg | ORAL_TABLET | Freq: Four times a day (QID) | ORAL | Status: DC | PRN

## 2022-09-21 MED ORDER — ACETAMINOPHEN 325 MG PO TABS
325.0000 mg | ORAL_TABLET | Freq: Four times a day (QID) | ORAL | Status: DC
  Administered 2022-09-21 – 2022-09-22 (×2): 325 mg via ORAL
  Filled 2022-09-21 (×2): qty 1

## 2022-09-21 MED ORDER — DIPHENHYDRAMINE HCL 12.5 MG/5ML PO ELIX: 12.5000 mg | ORAL_SOLUTION | Freq: Four times a day (QID) | ORAL | Status: DC | PRN

## 2022-09-21 MED ORDER — KETOROLAC TROMETHAMINE 30 MG/ML IJ SOLN
INTRAMUSCULAR | Status: DC | PRN
  Administered 2022-09-21: 30 mg via INTRAVENOUS

## 2022-09-21 MED ORDER — DEXAMETHASONE SODIUM PHOSPHATE 10 MG/ML IJ SOLN
INTRAMUSCULAR | Status: AC
  Filled 2022-09-21: qty 2

## 2022-09-21 MED ORDER — CIPROFLOXACIN IN D5W 400 MG/200ML IV SOLN
400.0000 mg | INTRAVENOUS | Status: AC
  Administered 2022-09-21: 400 mg via INTRAVENOUS

## 2022-09-21 MED ORDER — SENNA 8.6 MG PO TABS
1.0000 | ORAL_TABLET | Freq: Two times a day (BID) | ORAL | Status: DC
  Administered 2022-09-21 (×2): 8.6 mg via ORAL
  Filled 2022-09-21 (×2): qty 1

## 2022-09-21 MED ORDER — ONDANSETRON HCL 4 MG/2ML IJ SOLN
INTRAMUSCULAR | Status: AC
  Filled 2022-09-21: qty 4

## 2022-09-21 MED ORDER — BUPIVACAINE LIPOSOME 1.3 % IJ SUSP
INTRAMUSCULAR | Status: DC | PRN
  Administered 2022-09-21: 20 mL

## 2022-09-21 SURGICAL SUPPLY — 71 items
ADH SKN CLS APL DERMABOND .7 (GAUZE/BANDAGES/DRESSINGS) ×1
BAG DECANTER FOR FLEXI CONT (MISCELLANEOUS) ×1 IMPLANT
BINDER BREAST LRG (GAUZE/BANDAGES/DRESSINGS) IMPLANT
BINDER BREAST MEDIUM (GAUZE/BANDAGES/DRESSINGS) IMPLANT
BINDER BREAST XLRG (GAUZE/BANDAGES/DRESSINGS) IMPLANT
BINDER BREAST XXLRG (GAUZE/BANDAGES/DRESSINGS) IMPLANT
BIOPATCH RED 1 DISK 7.0 (GAUZE/BANDAGES/DRESSINGS) IMPLANT
BLADE HEX COATED 2.75 (ELECTRODE) ×1 IMPLANT
BLADE SURG 15 STRL LF DISP TIS (BLADE) ×2 IMPLANT
BLADE SURG 15 STRL SS (BLADE) ×1
BNDG GAUZE DERMACEA FLUFF 4 (GAUZE/BANDAGES/DRESSINGS) ×2 IMPLANT
BNDG GZE DERMACEA 4 6PLY (GAUZE/BANDAGES/DRESSINGS)
CANISTER SUCT 1200ML W/VALVE (MISCELLANEOUS) ×1 IMPLANT
CLEANSER WND VASHE 34 (WOUND CARE) IMPLANT
COVER BACK TABLE 60X90IN (DRAPES) ×1 IMPLANT
COVER MAYO STAND STRL (DRAPES) ×1 IMPLANT
DERMABOND ADVANCED .7 DNX12 (GAUZE/BANDAGES/DRESSINGS) IMPLANT
DRAIN CHANNEL 15F RND FF W/TCR (WOUND CARE) IMPLANT
DRAIN CHANNEL 19F RND (DRAIN) IMPLANT
DRAIN RELI 100 BL SUC LF ST (DRAIN) ×1
DRAPE LAPAROSCOPIC ABDOMINAL (DRAPES) ×1 IMPLANT
ELECT BLADE 4.0 EZ CLEAN MEGAD (MISCELLANEOUS) ×1
ELECT BLADE 6.5 EXT (BLADE) IMPLANT
ELECT REM PT RETURN 9FT ADLT (ELECTROSURGICAL) ×1
ELECTRODE BLDE 4.0 EZ CLN MEGD (MISCELLANEOUS) IMPLANT
ELECTRODE REM PT RTRN 9FT ADLT (ELECTROSURGICAL) ×1 IMPLANT
EVACUATOR SILICONE 100CC (DRAIN) IMPLANT
GAUZE PAD ABD 8X10 STRL (GAUZE/BANDAGES/DRESSINGS) ×2 IMPLANT
GLOVE BIO SURGEON STRL SZ 6.5 (GLOVE) ×2 IMPLANT
GOWN STRL REUS W/ TWL LRG LVL3 (GOWN DISPOSABLE) ×2 IMPLANT
GOWN STRL REUS W/TWL LRG LVL3 (GOWN DISPOSABLE) ×3
IV NS 1000ML (IV SOLUTION)
IV NS 1000ML BAXH (IV SOLUTION) IMPLANT
IV NS 500ML (IV SOLUTION)
IV NS 500ML BAXH (IV SOLUTION) IMPLANT
KIT FILL ASEPTIC TRANSFER (MISCELLANEOUS) IMPLANT
NDL HYPO 25X1 1.5 SAFETY (NEEDLE) ×1 IMPLANT
NDL SAFETY ECLIP 18X1.5 (MISCELLANEOUS) IMPLANT
NEEDLE HYPO 25X1 1.5 SAFETY (NEEDLE) ×1 IMPLANT
NS IRRIG 1000ML POUR BTL (IV SOLUTION) ×1 IMPLANT
PACK BASIN DAY SURGERY FS (CUSTOM PROCEDURE TRAY) ×1 IMPLANT
PENCIL SMOKE EVACUATOR (MISCELLANEOUS) ×1 IMPLANT
PIN SAFETY STERILE (MISCELLANEOUS) IMPLANT
SLEEVE SCD COMPRESS KNEE MED (STOCKING) ×1 IMPLANT
SPIKE FLUID TRANSFER (MISCELLANEOUS) IMPLANT
SPONGE T-LAP 18X18 ~~LOC~~+RFID (SPONGE) ×2 IMPLANT
STAPLER VISISTAT 35W (STAPLE) IMPLANT
STRIP CLOSURE SKIN 1/2X4 (GAUZE/BANDAGES/DRESSINGS) IMPLANT
STRIP SUTURE WOUND CLOSURE 1/2 (MISCELLANEOUS) IMPLANT
SUT MNCRL AB 4-0 PS2 18 (SUTURE) ×1 IMPLANT
SUT MON AB 3-0 SH 27 (SUTURE) ×1
SUT MON AB 3-0 SH27 (SUTURE) ×1 IMPLANT
SUT MON AB 5-0 PS2 18 (SUTURE) ×2 IMPLANT
SUT PDS 3-0 CT2 (SUTURE) ×1
SUT PDS AB 2-0 CT2 27 (SUTURE) IMPLANT
SUT PDS II 3-0 CT2 27 ABS (SUTURE) IMPLANT
SUT PROLENE 3 0 PS 2 (SUTURE) IMPLANT
SUT SILK 3 0 PS 1 (SUTURE) IMPLANT
SUT VIC AB 3-0 SH 27 (SUTURE)
SUT VIC AB 3-0 SH 27X BRD (SUTURE) IMPLANT
SUT VIC AB 4-0 PS2 18 (SUTURE) IMPLANT
SWAB COLLECTION DEVICE MRSA (MISCELLANEOUS) IMPLANT
SWAB CULTURE ESWAB REG 1ML (MISCELLANEOUS) IMPLANT
SYR 50ML LL SCALE MARK (SYRINGE) IMPLANT
SYR BULB IRRIG 60ML STRL (SYRINGE) ×1 IMPLANT
SYR CONTROL 10ML LL (SYRINGE) ×1 IMPLANT
TOWEL GREEN STERILE FF (TOWEL DISPOSABLE) ×2 IMPLANT
TRAY DSU PREP LF (CUSTOM PROCEDURE TRAY) ×1 IMPLANT
TUBE CONNECTING 20X1/4 (TUBING) ×1 IMPLANT
UNDERPAD 30X36 HEAVY ABSORB (UNDERPADS AND DIAPERS) ×2 IMPLANT
YANKAUER SUCT BULB TIP NO VENT (SUCTIONS) ×1 IMPLANT

## 2022-09-21 NOTE — Anesthesia Procedure Notes (Signed)
Procedure Name: LMA Insertion Date/Time: 09/21/2022 12:40 PM  Performed by: Alvera Novel, CRNAPre-anesthesia Checklist: Patient identified, Emergency Drugs available, Suction available and Patient being monitored Patient Re-evaluated:Patient Re-evaluated prior to induction Oxygen Delivery Method: Circle System Utilized Preoxygenation: Pre-oxygenation with 100% oxygen Induction Type: IV induction Ventilation: Mask ventilation without difficulty LMA: LMA inserted LMA Size: 4.0 Number of attempts: 1 Airway Equipment and Method: Bite block Placement Confirmation: positive ETCO2 Tube secured with: Tape Dental Injury: Teeth and Oropharynx as per pre-operative assessment

## 2022-09-21 NOTE — Interval H&P Note (Signed)
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  The various methods of treatment have been discussed with the patient and family. After consideration of risks, benefits and other options for treatment, the patient has consented to  Procedure(s): REMOVAL BREAST IMPLANTS (Right) CAPSULECTOMY (Right) as a surgical intervention.  The patient's history has been reviewed, patient examined, no change in status, stable for surgery.  I have reviewed the patient's chart and labs.  Questions were answered to the patient's satisfaction.     Alena Bills Yacine Garriga

## 2022-09-21 NOTE — Anesthesia Preprocedure Evaluation (Signed)
Anesthesia Evaluation  Patient identified by MRN, date of birth, ID band Patient awake    Reviewed: Allergy & Precautions, H&P , NPO status , Patient's Chart, lab work & pertinent test results  Airway Mallampati: II  TM Distance: >3 FB Neck ROM: Full    Dental no notable dental hx.    Pulmonary sleep apnea and Continuous Positive Airway Pressure Ventilation    Pulmonary exam normal breath sounds clear to auscultation       Cardiovascular hypertension, Normal cardiovascular exam Rhythm:Regular Rate:Normal     Neuro/Psych CRPS  Neuromuscular disease  negative psych ROS   GI/Hepatic Neg liver ROS,GERD  ,,  Endo/Other  diabetesHypothyroidism    Renal/GU negative Renal ROS  negative genitourinary   Musculoskeletal  (+)  Fibromyalgia -  Abdominal   Peds negative pediatric ROS (+)  Hematology negative hematology ROS (+)   Anesthesia Other Findings   Reproductive/Obstetrics negative OB ROS                             Anesthesia Physical Anesthesia Plan  ASA: 3  Anesthesia Plan: General   Post-op Pain Management: Toradol IV (intra-op)* and Tylenol PO (pre-op)*   Induction: Intravenous  PONV Risk Score and Plan: 3 and Ondansetron, Dexamethasone, Midazolam and Treatment may vary due to age or medical condition  Airway Management Planned: LMA  Additional Equipment:   Intra-op Plan:   Post-operative Plan: Extubation in OR  Informed Consent: I have reviewed the patients History and Physical, chart, labs and discussed the procedure including the risks, benefits and alternatives for the proposed anesthesia with the patient or authorized representative who has indicated his/her understanding and acceptance.     Dental advisory given  Plan Discussed with: CRNA and Surgeon  Anesthesia Plan Comments:        Anesthesia Quick Evaluation

## 2022-09-21 NOTE — Op Note (Signed)
Op report Unilateral Breast Exchange   DATE OF OPERATION:  09/21/2022  LOCATION: [facility]  SURGICAL DIVISION: Plastic Surgery  PREOPERATIVE DIAGNOSES:  1. History of right breast cancer.  2. Acquired absence of right breast.   POSTOPERATIVE DIAGNOSES:  1. History of right breast cancer.  2. Acquired absence of right breast.   PROCEDURE:  Removal of right breast impl;ant  SURGEON: Foster Simpson, DO  ASSISTANT: Caroline More, PA  ANESTHESIA:  General.   COMPLICATIONS: None.    INDICATIONS FOR PROCEDURE:  The patient, Sierra Perez, is a 62 y.o. female born on 1960-12-09, is here for further treatment after a mastectomy and placement of a tissue expander. She now presents for exchange of her expander for an implant.  She requires capsulotomies to better position the implant. MRN: [id number]  CONSENT:  Informed consent was obtained directly from the patient. Risks, benefits and alternatives were fully discussed. Specific risks including but not limited to bleeding, infection, hematoma, seroma, scarring, pain, implant infection, implant extrusion, capsular contracture, asymmetry, wound healing problems, and need for further surgery were all discussed. The patient did have an ample opportunity to have her questions answered to her satisfaction.   DESCRIPTION OF PROCEDURE:  The patient was taken to the operating room. SCDs were placed and IV antibiotics were given. The patient's chest was prepped and draped in a sterile fashion. A time out was performed and the implants to be used were identified.    The inframammary scar was opened with a #15 blade.  The bovie was used to dissect to the capsule and locate the ADM.  This was opened with the bovie.  The 400 cc Mentor implant was removed.  There was a hematoma of likely 100 cc.  Inspection of the pocket showed a normal healthy capsule and good integration of the biologic matrix.   Due to the pre-pectoralis  technique, I was very concerned with the thinness of the skin.  I made the decision to not try to remove it. Hemostasis was ensured with electrocautery.  The pocket was irrigated with Vashe.  A drain was placed and secured to the skin with the 3-0 Silk. New gloves were placed.  The capsule was closed with a 3-0 PDS suture. The remaining skin was closed with 3-0 Monocryl deep dermal and 4-0 Monocryl subcuticular stitches.  Dermabond was applied.  A breast binder and ABD was applied.  The patient was allowed to wake from anesthesia and taken to the recovery room in satisfactory condition.   The advanced practice practitioner (APP) assisted throughout the case.  The APP was essential in retraction and counter traction when needed to make the case progress smoothly.  This retraction and assistance made it possible to see the tissue plans for the procedure.  The assistance was needed for blood control, tissue re-approximation and assisted with closure of the incision site.

## 2022-09-21 NOTE — Discharge Instructions (Signed)

## 2022-09-21 NOTE — Transfer of Care (Signed)
Immediate Anesthesia Transfer of Care Note  Patient: Sierra Perez  Procedure(s) Performed: REMOVAL BREAST IMPLANTS (Right: Breast)  Patient Location: PACU  Anesthesia Type:General  Level of Consciousness: drowsy and patient cooperative  Airway & Oxygen Therapy: Patient Spontanous Breathing and Patient connected to face mask oxygen  Post-op Assessment: Report given to RN and Post -op Vital signs reviewed and stable  Post vital signs: Reviewed and stable  Last Vitals:  Vitals Value Taken Time  BP 108/63 09/21/22 1315  Temp 36.6 C 09/21/22 1315  Pulse 65 09/21/22 1328  Resp 18 09/21/22 1328  SpO2 100 % 09/21/22 1328  Vitals shown include unfiled device data.  Last Pain:  Vitals:   09/21/22 1153  TempSrc: Temporal  PainSc: 0-No pain      Patients Stated Pain Goal: 3 (09/21/22 1153)  Complications: No notable events documented.

## 2022-09-22 ENCOUNTER — Encounter (HOSPITAL_BASED_OUTPATIENT_CLINIC_OR_DEPARTMENT_OTHER): Payer: Self-pay | Admitting: Plastic Surgery

## 2022-09-22 ENCOUNTER — Other Ambulatory Visit (HOSPITAL_COMMUNITY): Payer: 59

## 2022-09-22 DIAGNOSIS — Z9011 Acquired absence of right breast and nipple: Secondary | ICD-10-CM | POA: Diagnosis not present

## 2022-09-22 NOTE — Discharge Summary (Signed)
Physician Discharge Summary  Patient ID: Sierra Perez MRN: [id number] DOB/AGE: 07/12/1960 62 y.o.  Admit date: 09/21/2022 Discharge date: 09/22/2022  Admission Diagnoses: History of right breast cancer  Discharge Diagnoses:  Active Problems:   Breast cancer [facility])   Discharged Condition: good  Hospital Course: Patient presented to the [facility] surgery center yesterday for scheduled procedure, removal of right breast implant with Dr. Ulice Bold.  She was admitted overnight for observation to the surgery center for pain control and close monitoring.  Patient is postop day 1 today.  Today, upon entering the room, patient is sitting in her bed in no acute distress.  She reports she is doing well, denies any acute issues overnight.  She reports that her pain has been controlled.  She denies any nausea or vomiting.  She reports that she has been up to ambulate to go to the bathroom.  She states she is ready to go home.  I spoke with nursing as well.  Nurses state that patient did very well overnight.  She states that only about 5 cc has come out of the drain.  Consults: None  Significant Diagnostic Studies: N/A  Treatments: surgery: Removal of right breast implant  Discharge Exam: Blood pressure 125/69, pulse 60, temperature 98.3 F (36.8 C), resp. rate 16, height 5\' 8"  (1.727 m), weight 74.5 kg, SpO2 100%. General appearance: alert, cooperative, and no distress Resp: Unlabored breathing, no respiratory distress Chest wall: Mepilex border dressing in place over the incision.  It is clean dry and intact.  No obvious fluid collections palpated on exam.  No overlying erythema or bruising.  JP drain is in place and functioning with approximately 10 cc of serosanguineous drainage in the bulb. Extremities: Lower extremities are nonswollen/symmetric and nontender to palpation  Disposition: Discharge disposition: 01-Home or Self Care     Discussed with patient that she may start her Eliquis  tonight.  Encouraged her to continue to ambulate.  Discussed with her that if she has any questions or concerns about anything she can call our office.  Discharge Instructions     (HEART FAILURE PATIENTS) Call MD:  Anytime you have any of the following symptoms: 1) 3 pound weight gain in 24 hours or 5 pounds in 1 week 2) shortness of breath, with or without a dry hacking cough 3) swelling in the hands, feet or stomach 4) if you have to sleep on extra pillows at night in order to breathe.   Complete by: As directed    Call MD for:  difficulty breathing, headache or visual disturbances   Complete by: As directed    Call MD for:  extreme fatigue   Complete by: As directed    Call MD for:  hives   Complete by: As directed    Call MD for:  persistant dizziness or light-headedness   Complete by: As directed    Call MD for:  persistant nausea and vomiting   Complete by: As directed    Call MD for:  redness, tenderness, or signs of infection (pain, swelling, redness, odor or green/yellow discharge around incision site)   Complete by: As directed    Call MD for:  severe uncontrolled pain   Complete by: As directed    Call MD for:  temperature >100.4   Complete by: As directed    Diet - low sodium heart healthy   Complete by: As directed    Increase activity slowly   Complete by: As directed  Allergies as of 09/22/2022       Reactions   Succinylcholine Other (See Comments)   Muscle aching 18 years ago from 07/02/20   Azithromycin       Keflex [cephalexin] Diarrhea, Nausea And Vomiting   Silver Rash   Sulfa Antibiotics Rash   And swelling generalized not in throat   Sulfasalazine Rash        Medication List     TAKE these medications    acetaminophen 500 MG tablet Commonly known as: TYLENOL Take 1,000 mg by mouth every 6 (six) hours as needed for moderate pain.   albuterol 108 (90 Base) MCG/ACT inhaler Commonly known as: VENTOLIN HFA Inhale 1 puff into the lungs every  6 (six) hours as needed for wheezing or shortness of breath.   amiodarone 200 MG tablet Commonly known as: PACERONE Take 1 tablet (200 mg total) by mouth daily.   anastrozole 1 MG tablet Commonly known as: ARIMIDEX TAKE 1 TABLET(1 MG) BY MOUTH DAILY What changed: See the new instructions.   apixaban 5 MG Tabs tablet Commonly known as: Eliquis Take 1 tablet (5 mg total) by mouth 2 (two) times daily. Resume 07/22/22 PM dose.   buPROPion 150 MG 24 hr tablet Commonly known as: WELLBUTRIN XL Take 150 mg by mouth daily.   CALCIUM CITRATE PO Take 3 tablets by mouth daily.   colchicine 0.6 MG tablet Take 1 tablet (0.6 mg total) by mouth 2 (two) times daily. What changed: when to take this   CULTURELLE PO Take 1 capsule by mouth daily.   DULoxetine 60 MG capsule Commonly known as: CYMBALTA Take 60 mg by mouth daily.   enoxaparin 60 MG/0.6ML injection Commonly known as: LOVENOX Inject 0.6 mLs (60 mg total) into the skin every 12 (twelve) hours for 2 days.   fluticasone 50 MCG/ACT nasal spray Commonly known as: FLONASE Place 2 sprays into both nostrils daily.   gabapentin 600 MG tablet Commonly known as: NEURONTIN TAKE 1 TABLET(600 MG) BY MOUTH THREE TIMES DAILY What changed: See the new instructions.   ipratropium 0.03 % nasal spray Commonly known as: ATROVENT Place 2 sprays into both nostrils 2 (two) times daily.   levothyroxine 50 MCG tablet Commonly known as: SYNTHROID Take 50 mcg by mouth every morning.   Mounjaro 12.5 MG/0.5ML Pen Generic drug: tirzepatide Inject 12.5 mg into the skin once a week.   ondansetron 4 MG tablet Commonly known as: Zofran Take 1 tablet (4 mg total) by mouth every 8 (eight) hours as needed for up to 20 doses for nausea or vomiting.   oxyCODONE 5 MG immediate release tablet Commonly known as: Roxicodone Take 1 tablet (5 mg total) by mouth every 8 (eight) hours as needed for up to 20 doses for severe pain.   pantoprazole 40 MG  tablet Commonly known as: Protonix Take 1 tablet (40 mg total) by mouth 2 (two) times daily.   Probiotic 1-250 BILLION-MG Caps Take 250 mg by mouth daily.   rosuvastatin 5 MG tablet Commonly known as: CRESTOR Take 1 tablet (5 mg total) by mouth daily.   valACYclovir 500 MG tablet Commonly known as: VALTREX Take 1 tablet (500 mg total) by mouth daily. Increase to bid x 3 days with symptoms   Vitamin D-3 25 MCG (1000 UT) Caps Take 1,000 Units by mouth daily.        Follow-up Information     Dillingham, Alena Bills, DO Follow up in 10 day(s).   Specialty: Plastic Surgery Contact  information: [address] [place] [address] [phone]                 [facility] [address] [place], [place] [address] [phone]  Signed: Laurena Spies 09/22/2022, 8:41 AM

## 2022-09-22 NOTE — Anesthesia Postprocedure Evaluation (Signed)
Anesthesia Post Note  Patient: Sierra Perez  Procedure(s) Performed: REMOVAL BREAST IMPLANTS (Right: Breast)     Patient location during evaluation: PACU Anesthesia Type: General Level of consciousness: awake and alert Pain management: pain level controlled Vital Signs Assessment: post-procedure vital signs reviewed and stable Respiratory status: spontaneous breathing, nonlabored ventilation, respiratory function stable and patient connected to nasal cannula oxygen Cardiovascular status: blood pressure returned to baseline and stable Postop Assessment: no apparent nausea or vomiting Anesthetic complications: no  No notable events documented.  Last Vitals:  Vitals:   09/22/22 0500 09/22/22 0600  BP:  126/65  Pulse: 61 74  Resp:  16  Temp:  36.9 C  SpO2: 98% 99%    Last Pain:  Vitals:   09/22/22 0600  TempSrc:   PainSc: 3                  Kaimana Lurz S

## 2022-09-23 ENCOUNTER — Telehealth: Payer: 59

## 2022-09-25 ENCOUNTER — Telehealth: Payer: Self-pay | Admitting: *Deleted

## 2022-09-25 NOTE — Telephone Encounter (Signed)
Received DME Standard Written Order on (09/15/22) from [facility] [place].  Requesting signature and return.  Given to provider to sign.  DME Standard Written Order signed and faxed back to [facility] [place].  Confirmation received and copy scanned into the chart.//AB/CMA

## 2022-09-29 ENCOUNTER — Encounter: Payer: Self-pay | Admitting: Plastic Surgery

## 2022-09-29 ENCOUNTER — Ambulatory Visit: Payer: 59 | Admitting: Plastic Surgery

## 2022-09-29 VITALS — BP 135/83 | HR 82

## 2022-09-29 DIAGNOSIS — Z9013 Acquired absence of bilateral breasts and nipples: Secondary | ICD-10-CM

## 2022-09-29 DIAGNOSIS — Z9889 Other specified postprocedural states: Secondary | ICD-10-CM

## 2022-09-29 DIAGNOSIS — C50411 Malignant neoplasm of upper-outer quadrant of right female breast: Secondary | ICD-10-CM

## 2022-09-29 NOTE — Progress Notes (Signed)
   Subjective:    Patient ID: Sierra Perez, female    DOB: Jun 05, 1960, 62 y.o.   MRN: [id number]  The patient is a 62 year old female here for follow-up after undergoing removal of a right breast implant.  Her history is as follows: She was diagnosed with breast cancer on the right breast over 10 years ago and had a right sided lumpectomy with radiation.  She had a left-sided mastopexy for symmetry.  She then had recurrent breast cancer in 2022 and decided to go with bilateral mastectomies and reconstruction.  She had the expanders placed above her pectoralis major muscle in June 2022.  She then came to see me in February 2022 with complaints of lack of symmetry.  We were looking at the possibility of doing a revision when she came to see me again in July with a very bruised right breast.  She thought about it and we talked about it and the decision was made by the patient to have the implant removed because of the risk of exposure. Her past medical history is positive for anxiety, breast cancer, clotting disorder, DVT, thyroid disease.  She has had a hysterectomy, foot surgery gastric sleeve, vena cava filter and a total knee replacement as well as the bilateral breast surgery.  She is 5 feet 8 inches tall and weighs around 230 pounds.  The patient underwent removal of the implant on September 21, 2022.   Review of Systems  Constitutional: Negative.   Eyes: Negative.   Respiratory: Negative.    Cardiovascular: Negative.   Endocrine: Negative.        Objective:   Physical Exam Constitutional:      Appearance: Normal appearance.  Cardiovascular:     Rate and Rhythm: Normal rate.     Pulses: Normal pulses.  Skin:    Capillary Refill: Capillary refill takes less than 2 seconds.  Neurological:     Mental Status: She is alert and oriented to person, place, and time.  Psychiatric:        Mood and Affect: Mood normal.        Behavior: Behavior normal.        Thought Content: Thought content  normal.        Judgment: Judgment normal.        Assessment & Plan:     ICD-10-CM   1. Malignant neoplasm of upper-outer quadrant of right breast in female, estrogen receptor negative (HCC)  C50.411    Z17.1     2. Acquired absence of both breasts and nipples  Z90.13       The patient is overall doing well.  Her skin looks much better than it did prior to surgery.  The incision appears to be healing well.  The drain output has been very minimal so I went ahead and removed the drain.  Will plan on a postop picture at her next visit.  The patient can go into her sports bra.

## 2022-10-06 ENCOUNTER — Other Ambulatory Visit (HOSPITAL_COMMUNITY): Payer: 59

## 2022-10-09 ENCOUNTER — Ambulatory Visit (INDEPENDENT_AMBULATORY_CARE_PROVIDER_SITE_OTHER): Payer: Self-pay | Admitting: Podiatry

## 2022-10-09 DIAGNOSIS — L603 Nail dystrophy: Secondary | ICD-10-CM

## 2022-10-09 NOTE — Progress Notes (Signed)
Patient presents today for the 6th laser treatment. Diagnosed with mycotic nail infection by Dr. Al Corpus.   Toenails most affected: bilateral 1st and right fifth-  distally.  She relates she has not noticed improvement after laser treatments.    Reviewed laser safety / safety glasses dispensed to patient to wear during treatment.  The affected nails were debrided with a power debriding bur. Laser therapy was administered to bilateral hallux nails and bilateral fifth digital nails.  The remaining nails were treated with a single laser pass. She tolerated this well.  Recommended a follow up visit with Dr. Al Corpus to discuss further treatment options.  She is not interested in oral lamisil.  We briefly discussed the newer topicals which she may consider.

## 2022-10-13 ENCOUNTER — Ambulatory Visit (INDEPENDENT_AMBULATORY_CARE_PROVIDER_SITE_OTHER): Payer: 59 | Admitting: Student

## 2022-10-13 ENCOUNTER — Encounter: Payer: Self-pay | Admitting: Student

## 2022-10-13 VITALS — BP 118/74 | HR 69

## 2022-10-13 DIAGNOSIS — Z9889 Other specified postprocedural states: Secondary | ICD-10-CM

## 2022-10-13 DIAGNOSIS — C50411 Malignant neoplasm of upper-outer quadrant of right female breast: Secondary | ICD-10-CM

## 2022-10-13 NOTE — Progress Notes (Signed)
Patient is a 62 year old female with history of breast cancer.  She underwent removal of her right breast implant with Dr. Ulice Bold on 09/21/2022.  During the procedure, 400 cc Mentor implant was removed and there was a hematoma of likely 100 cc in the pocket.  A drain was placed to the right breast.  Patient is approximately 3 weeks postop.  She presents to the clinic today for postoperative follow-up.  Patient was last seen in the clinic on 09/29/2022.  At this visit, patient was doing well.  Her skin was much improved since prior to surgery.  The incision appeared to be healing well.  The drain output was minimal so the drain was removed.  Today, patient reports she is doing well.  She states that she has not been having any pain.  She denies any fevers or chills.  She denies any drainage from the area.  Denies any new complaints or concerns today.  Chaperone present on exam.  On exam, patient is sitting upright in no acute distress.  Right breast is noted to be somewhat scarred down centrally.  Remainder of the breast is soft.  No overlying erythema.  No obvious fluid collections.  Steri-Strip over the incision is intact, clean and dry.  It was removed.  Incision is intact and healing well.  No signs of infection on exam.  Discussed with patient that given she is starting to what appears to be scarring down a little bit, I recommended she massage the area at least twice a day and use light range of motion to her right upper extremity.  We did discuss the possibility of physical therapy, but patient would like to start by doing exercises that she has been given by physical therapy at home first.     Discussed with patient to continue compression at all times.  Discussed with her that she should avoid vigorous activities and avoid submerging her incision.  I also discussed with her she should apply Vaseline to her incision daily.  Patient expressed understanding.  Patient to follow back up in a few  weeks.  Instructed her to call in the meantime she has any questions or concerns about anything.  Pictures were obtained of the patient and placed in the chart with the patient's or guardian's permission.

## 2022-10-14 ENCOUNTER — Ambulatory Visit: Payer: 59 | Admitting: Nurse Practitioner

## 2022-10-21 ENCOUNTER — Ambulatory Visit: Payer: 59 | Attending: Nurse Practitioner | Admitting: Physician Assistant

## 2022-10-21 ENCOUNTER — Encounter: Payer: Self-pay | Admitting: Physician Assistant

## 2022-10-21 VITALS — BP 120/84 | HR 70 | Ht 68.0 in | Wt 160.0 lb

## 2022-10-21 DIAGNOSIS — I10 Essential (primary) hypertension: Secondary | ICD-10-CM | POA: Diagnosis not present

## 2022-10-21 DIAGNOSIS — I3139 Other pericardial effusion (noninflammatory): Secondary | ICD-10-CM

## 2022-10-21 DIAGNOSIS — I480 Paroxysmal atrial fibrillation: Secondary | ICD-10-CM | POA: Diagnosis not present

## 2022-10-21 DIAGNOSIS — Z86718 Personal history of other venous thrombosis and embolism: Secondary | ICD-10-CM

## 2022-10-21 DIAGNOSIS — E785 Hyperlipidemia, unspecified: Secondary | ICD-10-CM

## 2022-10-21 DIAGNOSIS — E119 Type 2 diabetes mellitus without complications: Secondary | ICD-10-CM

## 2022-10-21 MED ORDER — PANTOPRAZOLE SODIUM 40 MG PO TBEC: 40.0000 mg | DELAYED_RELEASE_TABLET | Freq: Every day | ORAL | Status: AC

## 2022-10-21 NOTE — Patient Instructions (Signed)
Medication Instructions:  DECREASE PANTOPRAZOLE(PROTONIX) TO 40 MG DAILY. *If you need a refill on your cardiac medications before your next appointment, please call your pharmacy*   Lab Work: NO LABS If you have labs (blood work) drawn today and your tests are completely normal, you will receive your results only by: MyChart Message (if you have MyChart) OR A paper copy in the mail If you have any lab test that is abnormal or we need to change your treatment, we will call you to review the results.   Testing/Procedures: NO TESTING   Follow-Up: At [facility], you and your health needs are our priority.  As part of our continuing mission to provide you with exceptional heart care, we have created designated Provider Care Teams.  These Care Teams include your primary Cardiologist (physician) and Advanced Practice Providers (APPs -  Physician Assistants and Nurse Practitioners) who all work together to provide you with the care you need, when you need it.    Your next appointment:   5-6 month(s)  Provider:   Bryan Lemma, MD

## 2022-10-21 NOTE — Progress Notes (Unsigned)
Cardiology Office Note:  .   Date:  10/21/2022  ID:  Sierra Perez, DOB 08/01/1960, MRN [id number] PCP: Georgann Housekeeper, MD  [facility] Providers Cardiologist:  Bryan Lemma, MD { Click to update primary MD,subspecialty MD or APP then REFRESH:1}   History of Present Illness: .   Sierra Perez is a 62 y.o. 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  Heart monitor in January 2023 showed predominantly sinus rhythm, 8 runs of SVT, rare PAC and PVCs.  Coronary CT obtained in January 2023 showed a coronary calcium score of 26 which placed the patient in the 78th percentile, minimal plaque.  Echocardiogram at the time showed EF 55 to 60%, normal LV function, no regional wall motion abnormality.  He was seen by Dr. Gala Romney in the office in April 2023 and was doing well.  More recently, he was readmitted to the hospital in June 2024 in the setting of chest pain and EKG changes concerning for inferior STEMI versus pericarditis.  She had fatigue, diaphoresis and nausea.  She underwent emergent cardiac catheterization that revealed normal coronary arteries.  Echocardiogram post cath showed large pericardial effusion with possible tamponade.  She underwent pericardiocentesis on 07/20/2022 with removal of 400 cc of dark bloody fluid.  Cytology report was negative for malignancy.  It was recommended to hold Eliquis for minimum of 1 month afterward.  She was treated with colchicine and ibuprofen.  She developed A-fib with RVR on 07/23/2022 which was a new diagnosis.  She was treated with IV amiodarone and later transition to oral amiodarone with plan to continue in the future if subsequent heart monitor was negative for recurrent A-fib.  She converted back to sinus rhythm on amiodarone.  She remained on anticoagulation therapy.  30-day heart monitor was ordered.  Repeat limited  echocardiogram on 07/30/2022 showed EF 60 to 65%, no regional wall motion abnormality, small pericardial effusion, no evidence of tamponade, mild mitral regurgitation and mild AI.  Patient was last seen by Bernadene Person NP on 07/23/2022 at which time she was doing well.  She did have some diarrhea on the higher dose of colchicine, colchicine dose was reduced to daily dosing.  Ibuprofen discontinued after 2 weeks of therapy due to the need for Eliquis.  Repeat limited echocardiogram obtained on 09/07/2022 showed EF 65 to 70%, resolution of any pericardial effusion.  More recent, patient underwent removal of right breast implant on 09/21/2022.  Recent heart monitor showed normal sinus rhythm, no sign of recurrent A-fib.  Patient presents today for follow-up.  She denies any chest pain or shortness of breath.  Given the lack of recurrent A-fib on recent heart monitor, we have discontinued amiodarone therapy.  She has completed 24-month course of colchicine, I will also discontinue colchicine as well.  Her Protonix was increased to 40 mg twice a day given the need for NSAID, now she has completed NSAID, will reduce Protonix back down to once a day.  Overall she has been doing well.  She is maintaining sinus rhythm based on physical exam.  At this point, she no longer have physical limitations from cardiac perspective.  She is still on 15 pound lifting restriction from the recent breast surgery.  She can follow-up with Dr. [name] in 5 to 6 months.  ROS: ***  Studies Reviewed: .        *** Risk Assessment/Calculations:   {Does this patient have  ATRIAL FIBRILLATION?:[id number]}         Physical Exam:   VS:  BP 120/84 (BP Location: Right Arm, Patient Position: Sitting, Cuff Size: Normal)   Pulse 70   Ht 5\' 8"  (1.727 m)   Wt 160 lb (72.6 kg)   BMI 24.33 kg/m    Wt Readings from Last 3 Encounters:  10/21/22 160 lb (72.6 kg)  09/21/22 164 lb 3.9 oz (74.5 kg)  09/18/22 160 lb 15 oz (73 kg)    GEN: Well  nourished, well developed in no acute distress NECK: No JVD; No carotid bruits CARDIAC: ***RRR, no murmurs, rubs, gallops RESPIRATORY:  Clear to auscultation without rales, wheezing or rhonchi  ABDOMEN: Soft, non-tender, non-distended EXTREMITIES:  No edema; No deformity   ASSESSMENT AND PLAN: .   ***    {Are you ordering a CV Procedure (e.g. stress test, cath, DCCV, TEE, etc)?   Press F2        :[id number]}  Dispo: ***  Signed, Azalee Course, PA

## 2022-10-29 ENCOUNTER — Encounter: Payer: Self-pay | Admitting: Hematology and Oncology

## 2022-10-29 ENCOUNTER — Ambulatory Visit (INDEPENDENT_AMBULATORY_CARE_PROVIDER_SITE_OTHER): Payer: 59 | Admitting: Podiatry

## 2022-10-29 DIAGNOSIS — L603 Nail dystrophy: Secondary | ICD-10-CM | POA: Diagnosis not present

## 2022-10-29 NOTE — Progress Notes (Signed)
Sierra Perez presents today for follow-up of her laser therapy.  Recently been in the hospital for myocarditis.  She does not feel that the laser has really done too much.  Objective: Vitals are stable alert and oriented x 3.  It appears that the laser therapy has done quite well actually she only has a very very small amount at the very distal margin of the nail.  I encouraged her to let this continue to grow out since her last visit with the laser was only 10/09/2022.  Assessment: Resolving onychomycosis after laser therapy.  Plan: Follow-up with me in 3 months we will reevaluate then continue to cut the nail as it grows.

## 2022-10-30 ENCOUNTER — Ambulatory Visit (INDEPENDENT_AMBULATORY_CARE_PROVIDER_SITE_OTHER): Payer: 59 | Admitting: Student

## 2022-10-30 ENCOUNTER — Encounter: Payer: Self-pay | Admitting: Student

## 2022-10-30 VITALS — BP 111/73 | HR 74 | Ht 68.0 in | Wt 161.6 lb

## 2022-10-30 DIAGNOSIS — Z9013 Acquired absence of bilateral breasts and nipples: Secondary | ICD-10-CM

## 2022-10-30 NOTE — Progress Notes (Signed)
Patient is a 62 year old female with history of breast cancer.  She underwent removal of her right breast implant with Dr. Ulice Bold on 09/21/2022.  She is almost 6 weeks postop.  She presents to the clinic today for postoperative follow-up.  Patient was last seen in the clinic on 10/13/2022.  At this visit, patient reports she is doing well.  On exam, her right breast was noted to be somewhat scarred down centrally.  The remainder of the breast was soft.  Incision was clean dry and intact.  Recommended patient massage her breast twice a day and use light range of motion to her right upper extremity.  Today, patient reports she is doing well.  She states that since her last visit, she has noticed that her right breast area feels a little bit tighter and she notices that her range of motion is a little bit more limited.  She states she has been trying to do gentle range of motion to the area.  She denies any other issues or concerns at this time.  Denies any fevers or chills.  Chaperone present on exam.  On exam, patient is sitting upright in no acute distress.  Right breast still appears to be somewhat scarred down centrally, similar to previous exam.  To the superior aspect of the breast, there is some fluid palpated.  No overlying skin changes.  No overlying redness.  Incision appears to be intact and well-healed.  There is a Monocryl suture that is noted.  This was removed without any difficulty.  Patient tolerated well.  Right chest wall was prepped with alcohol.  Fluid to the right chest wall was aspirated.  Approximately 35 cc of dark serosanguineous drainage was aspirated.  Patient tolerated well.  Discussed with patient would like her to continue compression at all times and avoid heavy or vigorous activities.  Discussed with her she can start some gradual increase in activity, but to avoid strenuous upper extremity activity or vigorous activities.  Patient expressed understanding.  Discussed with  patient would like her to apply Vaseline to her incision daily for the next week or so.  Patient expressed understanding.  Will refer patient to physical therapy to help with tightening and limited range of motion.  Patient to follow back up in 2 weeks.  Instructed patient to call back in the meantime if she experiences any reaccumulation of fluid, redness, fevers, or chills or if she has any questions or concerns about anything.

## 2022-11-03 ENCOUNTER — Other Ambulatory Visit: Payer: Self-pay

## 2022-11-03 ENCOUNTER — Other Ambulatory Visit (HOSPITAL_BASED_OUTPATIENT_CLINIC_OR_DEPARTMENT_OTHER): Payer: Self-pay

## 2022-11-03 ENCOUNTER — Other Ambulatory Visit (HOSPITAL_COMMUNITY): Payer: Self-pay

## 2022-11-05 ENCOUNTER — Encounter: Payer: Self-pay | Admitting: Hematology and Oncology

## 2022-11-05 ENCOUNTER — Other Ambulatory Visit (HOSPITAL_BASED_OUTPATIENT_CLINIC_OR_DEPARTMENT_OTHER): Payer: Self-pay

## 2022-11-06 ENCOUNTER — Other Ambulatory Visit: Payer: Self-pay | Admitting: *Deleted

## 2022-11-06 DIAGNOSIS — Z171 Estrogen receptor negative status [ER-]: Secondary | ICD-10-CM

## 2022-11-06 NOTE — Progress Notes (Signed)
Received call from pt with complaint of ongoing fatigue and requesting lab work to be drawn.  Verbal orders received by MD to obtain iron labs, CBC, CMP.

## 2022-11-09 ENCOUNTER — Inpatient Hospital Stay: Payer: 59 | Attending: Hematology and Oncology

## 2022-11-09 DIAGNOSIS — Z9884 Bariatric surgery status: Secondary | ICD-10-CM | POA: Diagnosis not present

## 2022-11-09 DIAGNOSIS — Z9013 Acquired absence of bilateral breasts and nipples: Secondary | ICD-10-CM | POA: Diagnosis not present

## 2022-11-09 DIAGNOSIS — Z9221 Personal history of antineoplastic chemotherapy: Secondary | ICD-10-CM | POA: Diagnosis not present

## 2022-11-09 DIAGNOSIS — Z923 Personal history of irradiation: Secondary | ICD-10-CM | POA: Diagnosis not present

## 2022-11-09 DIAGNOSIS — Z853 Personal history of malignant neoplasm of breast: Secondary | ICD-10-CM | POA: Insufficient documentation

## 2022-11-09 DIAGNOSIS — E538 Deficiency of other specified B group vitamins: Secondary | ICD-10-CM | POA: Diagnosis present

## 2022-11-09 DIAGNOSIS — Z171 Estrogen receptor negative status [ER-]: Secondary | ICD-10-CM

## 2022-11-09 LAB — CBC WITH DIFFERENTIAL (CANCER CENTER ONLY)
Abs Immature Granulocytes: 0.01 10*3/uL (ref 0.00–0.07)
Basophils Absolute: 0 10*3/uL (ref 0.0–0.1)
Basophils Relative: 0 %
Eosinophils Absolute: 0.1 10*3/uL (ref 0.0–0.5)
Eosinophils Relative: 2 %
HCT: 36.5 % (ref 36.0–46.0)
Hemoglobin: 12.3 g/dL (ref 12.0–15.0)
Immature Granulocytes: 0 %
Lymphocytes Relative: 20 %
Lymphs Abs: 1.3 10*3/uL (ref 0.7–4.0)
MCH: 30.7 pg (ref 26.0–34.0)
MCHC: 33.7 g/dL (ref 30.0–36.0)
MCV: 91 fL (ref 80.0–100.0)
Monocytes Absolute: 0.5 10*3/uL (ref 0.1–1.0)
Monocytes Relative: 7 %
Neutro Abs: 4.5 10*3/uL (ref 1.7–7.7)
Neutrophils Relative %: 71 %
Platelet Count: 232 10*3/uL (ref 150–400)
RBC: 4.01 MIL/uL (ref 3.87–5.11)
RDW: 14.6 % (ref 11.5–15.5)
WBC Count: 6.4 10*3/uL (ref 4.0–10.5)
nRBC: 0 % (ref 0.0–0.2)

## 2022-11-09 LAB — CMP (CANCER CENTER ONLY)
ALT: 18 U/L (ref 0–44)
AST: 21 U/L (ref 15–41)
Albumin: 4.3 g/dL (ref 3.5–5.0)
Alkaline Phosphatase: 63 U/L (ref 38–126)
Anion gap: 7 (ref 5–15)
BUN: 15 mg/dL (ref 8–23)
CO2: 27 mmol/L (ref 22–32)
Calcium: 9.9 mg/dL (ref 8.9–10.3)
Chloride: 106 mmol/L (ref 98–111)
Creatinine: 0.96 mg/dL (ref 0.44–1.00)
GFR, Estimated: >60 mL/min (ref >60)
Glucose, Bld: 77 mg/dL (ref 70–99)
Potassium: 3.9 mmol/L (ref 3.5–5.1)
Sodium: 140 mmol/L (ref 135–145)
Total Bilirubin: 0.9 mg/dL (ref 0.3–1.2)
Total Protein: 7 g/dL (ref 6.5–8.1)

## 2022-11-09 LAB — IRON AND IRON BINDING CAPACITY (CC-WL,HP ONLY)
Iron: 85 ug/dL (ref 28–170)
Saturation Ratios: 22 % (ref 10.4–31.8)
TIBC: 384 ug/dL (ref 250–450)
UIBC: 299 ug/dL (ref 148–442)

## 2022-11-09 LAB — VITAMIN B12: Vitamin B-12: 175 pg/mL — ABNORMAL LOW (ref 180–914)

## 2022-11-09 LAB — FERRITIN: Ferritin: 22 ng/mL (ref 11–307)

## 2022-11-09 LAB — SAMPLE TO BLOOD BANK

## 2022-11-10 ENCOUNTER — Inpatient Hospital Stay (HOSPITAL_BASED_OUTPATIENT_CLINIC_OR_DEPARTMENT_OTHER): Payer: 59 | Admitting: Hematology and Oncology

## 2022-11-10 DIAGNOSIS — E538 Deficiency of other specified B group vitamins: Secondary | ICD-10-CM

## 2022-11-10 DIAGNOSIS — Z171 Estrogen receptor negative status [ER-]: Secondary | ICD-10-CM

## 2022-11-10 DIAGNOSIS — D509 Iron deficiency anemia, unspecified: Secondary | ICD-10-CM

## 2022-11-10 DIAGNOSIS — C50411 Malignant neoplasm of upper-outer quadrant of right female breast: Secondary | ICD-10-CM

## 2022-11-10 NOTE — Progress Notes (Signed)
HEMATOLOGY-ONCOLOGY TELEPHONE VISIT PROGRESS NOTE  I connected with our patient on 11/10/22 at  7:45 AM EDT by telephone and verified that I am speaking with the correct person using two identifiers.  I discussed the limitations, risks, security and privacy concerns of performing an evaluation and management service by telephone and the availability of in person appointments.  I also discussed with the patient that there may be a patient responsible charge related to this service. The patient expressed understanding and agreed to proceed.   History of Present Illness: Follow-up to discuss lab results for iron and B12 deficiency  History of Present Illness   The patient, with a history of breast cancer, cardiac disease, and gastric sleeve surgery, presents with fatigue and vision changes. She reports that the anastrozole is well tolerated and she has recovered from a recent cardiac event. She had a breast implant removed recently and is currently undergoing physical therapy due to scar tissue adhering to the chest wall. She also reports feeling off balance but denies peripheral neuropathy. She is planning an active vacation in the near future.        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-, PR-, HER positive 3+, Ki67 20-25%   08/27/2012 Cancer Staging   Staging form: Breast, AJCC 8th Edition - Clinical stage from 08/27/2012: Stage IIA (cT2, cN0, cM0, G3, ER-, PR-, HER2+) - Signed by [name], MD on 11/17/2019   09/06/2012 Breast MRI   Right breast, lateral to the nipple, 10x14x15mm mass with mildly irregular borders   09/10/2012 Echocardiogram   EF 55-60%    Chemotherapy   Neoadjuvant Taxotere/Carboplatin/Herceptin/Perjeta followed by adjuvant Herceptin maintenance   01/09/2013 Breast MRI   Complete pathological response to neoadjuvant  treatment: right breast mass previously noted at 9:00 position no longer identified.   02/13/2013 Surgery   Right breast lumpectomy: benign parenchyma with some stromal fibrosis consistent with previous tumor site, focal area of atypical lobular hyperplasia, all lymph nodes negative.     Radiation Therapy   Adjuvant radiation   04/06/2013 Imaging   DEXA: T-score of -1.1, osteopenia    09/25/2016 Initial Biopsy   Columnar cell alteration with atypia, foreign body granuloma   05/10/2020 Relapse/Recurrence   Breast MRI detected new linear non-mass enhancement right breast 2.1 cm, non-mass enhancement centrally left breast spanning 10.5 cm, left breast nipple areolar complex enhancement: Biopsy right breast: Grade 2 invasive lobular cancer.  ER/PR positive HER-2 equivocal by IHC FISH pending, Ki-67 1% left breast biopsy anterior fibrocystic change, posterior atypical lobular hyperplasia/LCIS   05/26/2020 Genetic Testing   Negative genetic testing on the CancerNext-Expanded+RNAinsight panel.  The CancerNext-Expanded gene panel offered by [facility] and includes sequencing and rearrangement analysis for the following 77 genes: AIP, ALK, APC*, ATM*, AXIN2, BAP1, BARD1, BLM, BMPR1A, BRCA1*, BRCA2*, BRIP1*, CDC73, CDH1*, CDK4, CDKN1B, CDKN2A, CHEK2*, CTNNA1, DICER1, FANCC, FH, FLCN, GALNT12, KIF1B, LZTR1, MAX, MEN1, MET, MLH1*, MSH2*, MSH3, MSH6*, MUTYH*, NBN, NF1*, NF2, NTHL1, PALB2*, PHOX2B, PMS2*, POT1, PRKAR1A, PTCH1, PTEN*, RAD51C*, RAD51D*, RB1, RECQL, RET, SDHA, SDHAF2, SDHB, SDHC, SDHD, SMAD4, SMARCA4, SMARCB1, SMARCE1, STK11, SUFU, TMEM127, TP53*, TSC1, TSC2, VHL and XRCC2 (sequencing and deletion/duplication); EGFR, EGLN1, HOXB13, KIT, MITF, PDGFRA, POLD1, and POLE (sequencing only); EPCAM and GREM1 (deletion/duplication only). DNA and RNA analyses performed for * genes. The report date is May 26, 2020.    07/08/2020 Surgery  Bilateral mastectomies with reconstruction: Right mastectomy: Grade 2  ILC 0.9 cm, ALH, margins negative, 0/1 lymph, left mastectomy: Benign    08/2020 -  Anti-estrogen oral therapy   adjuvant antiestrogen therapy with anastrozole 1 mg daily x7 years started July 2022      REVIEW OF SYSTEMS:   Constitutional: Denies fevers, chills or abnormal weight loss All other systems were reviewed with the patient and are negative. Observations/Objective:     Assessment Plan:  Malignant neoplasm of upper-outer quadrant of right breast in female, estrogen receptor negative (HCC) 08/27/2012: Right breast: Stage Ia IDC with DCIS T2 N0 M0 ER negative PR negative, HER-2 positive, Ki-67 20 to 25% status post neoadjuvant TCHP: Complete pathologic response, adjuvant radiation   05/10/2020: Recurrence/relapse: Right breast: 2.1 cm non-mass enhancement: Biopsy grade 2 invasive lobular cancer ER/PR positive HER-2 equivocal by IHC FISH pending, Ki-67 1% Left breast: 10.5 cm non-mass enhancement anterior biopsy fibrocystic change, posterior biopsy ALH/LCIS   Treatment plan: 1.   07/08/2020 Bilateral mastectomies with reconstruction: Right mastectomy: Grade 2 ILC 0.9 cm, ALH, margins negative, 0/1 lymph, left mastectomy: Benign 2. adjuvant antiestrogen therapy with anastrozole 1 mg daily x7 years started July 2022 Genetic testing: Neg ------------------------------------------------------------ Current treatment: Adjuvant antiestrogen therapy with anastrozole 1 mg daily x7 years started July 2022 Anastrozole Toxicities: Denies any major side effects to anastrozole therapy. Palpable lump in the right axilla: Ultrasound 03/24/2022: No sonographic evidence of malignancy.   CT angio chest 07/16/2022: No PE, stable 3 mm right lung nodule Hospitalization 07/18/2022-07/22/2022: (Chest Pain leaning forward) Pericarditis and pericardial effusion status post pericardiocentesis: Cytology benign, A.Fib Anemia: During hospitalization.  Implant removal: 09/2022  Lab review 11/09/2022: Hemoglobin 12.3,  B12 175 (gastric sleeve 10 years ago), ferritin 22, iron saturation 22%, CMP is normal  Recommendation:  Patient will receive 2 inj of B12 before her vacation and then start oral B 12 after that B12 supplementation 5000 mcg sublingual daily  Recheck labs in 4 months   I discussed the assessment and treatment plan with the patient. The patient was provided an opportunity to ask questions and all were answered. The patient agreed with the plan and demonstrated an understanding of the instructions. The patient was advised to call back or seek an in-person evaluation if the symptoms worsen or if the condition fails to improve as anticipated.   I provided 12 minutes of non-face-to-face time during this encounter.  This includes time for charting and coordination of care   Tamsen Meek, MD

## 2022-11-10 NOTE — Assessment & Plan Note (Addendum)
08/27/2012: Right breast: Stage Ia IDC with DCIS T2 N0 M0 ER negative PR negative, HER-2 positive, Ki-67 20 to 25% status post neoadjuvant TCHP: Complete pathologic response, adjuvant radiation   05/10/2020: Recurrence/relapse: Right breast: 2.1 cm non-mass enhancement: Biopsy grade 2 invasive lobular cancer ER/PR positive HER-2 equivocal by IHC FISH pending, Ki-67 1% Left breast: 10.5 cm non-mass enhancement anterior biopsy fibrocystic change, posterior biopsy ALH/LCIS   Treatment plan: 1.   07/08/2020 Bilateral mastectomies with reconstruction: Right mastectomy: Grade 2 ILC 0.9 cm, ALH, margins negative, 0/1 lymph, left mastectomy: Benign 2. adjuvant antiestrogen therapy with anastrozole 1 mg daily x7 years started July 2022 Genetic testing: Neg ------------------------------------------------------------ Current treatment: Adjuvant antiestrogen therapy with anastrozole 1 mg daily x7 years started July 2022 Anastrozole Toxicities: Denies any major side effects to anastrozole therapy. Palpable lump in the right axilla: Ultrasound 03/24/2022: No sonographic evidence of malignancy.   CT angio chest 07/16/2022: No PE, stable 3 mm right lung nodule Hospitalization 07/18/2022-07/22/2022: (Chest Pain leaning forward) Pericarditis and pericardial effusion status post pericardiocentesis: Cytology benign, A.Fib Anemia: During hospitalization.  Implant removal: 09/2022  Lab review 11/09/2022: Hemoglobin 12.3, B12 175 (gastric sleeve 10 years ago), ferritin 22, iron saturation 22%, CMP is normal  Recommendation:  Patient will receive 2 inj of B12 before her vacation and then start oral B 12 after that B12 supplementation 5000 mcg sublingual daily  Recheck labs in 4 months

## 2022-11-11 ENCOUNTER — Encounter: Payer: Self-pay | Admitting: Physical Therapy

## 2022-11-11 ENCOUNTER — Other Ambulatory Visit: Payer: Self-pay

## 2022-11-11 ENCOUNTER — Ambulatory Visit: Payer: 59 | Attending: Student | Admitting: Physical Therapy

## 2022-11-11 DIAGNOSIS — Z853 Personal history of malignant neoplasm of breast: Secondary | ICD-10-CM | POA: Diagnosis not present

## 2022-11-11 DIAGNOSIS — I972 Postmastectomy lymphedema syndrome: Secondary | ICD-10-CM | POA: Insufficient documentation

## 2022-11-11 DIAGNOSIS — Z9013 Acquired absence of bilateral breasts and nipples: Secondary | ICD-10-CM | POA: Diagnosis not present

## 2022-11-11 DIAGNOSIS — R293 Abnormal posture: Secondary | ICD-10-CM | POA: Diagnosis not present

## 2022-11-11 DIAGNOSIS — L599 Disorder of the skin and subcutaneous tissue related to radiation, unspecified: Secondary | ICD-10-CM | POA: Insufficient documentation

## 2022-11-11 DIAGNOSIS — Z483 Aftercare following surgery for neoplasm: Secondary | ICD-10-CM | POA: Diagnosis not present

## 2022-11-11 NOTE — Therapy (Signed)
OUTPATIENT PHYSICAL THERAPY  UPPER EXTREMITY ONCOLOGY EVALUATION  Patient Name: Sierra Perez MRN: [id number] DOB:02-19-1960, 62 y.o., female Today's Date: 11/11/2022  END OF SESSION:  PT End of Session - 11/11/22 1522     Visit Number 1    Number of Visits 5    Date for PT Re-Evaluation 12/09/22    Authorization Time Period pt reports that there will be 4 visits left after today ([name] called and they said more but previously when she was here she had been told she had used most of her visits)    PT Start Time 1504    PT Stop Time 1600    PT Time Calculation (min) 56 min    Activity Tolerance Patient tolerated treatment well    Behavior During Therapy WFL for tasks assessed/performed             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, her-2 positive, ER/PR negative   Clotting disorder (HCC)    testing was negative, h/o DVT while on treatment   Colon polyps    Complex regional pain syndrome i of right lower limb    RDS   Complication of anesthesia    Cystitis    Diabetes mellitus without complication (HCC)    DVT (deep venous thrombosis) (HCC)    started in 2015, multiple   Dysrhythmia    Family history of breast cancer    Family history of colon cancer    Fibroid    Fibromyalgia    GERD (gastroesophageal reflux disease)    Heart murmur    HLD (hyperlipidemia)    HPV in female    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   Hypertension    Hypothyroidism    IBS (irritable bowel syndrome)    Mitral valve prolapse    Peptic ulcer    Personal history of malignant neoplasm of breast    Sleep apnea treated with continuous positive airway pressure (CPAP)    Past Surgical History:  Procedure Laterality Date   ABDOMINAL HYSTERECTOMY  2010   fibroids, h/o abnormal pap smears   AXILLARY SENTINEL NODE BIOPSY Right 07/08/2020   Procedure: RIGHT AXILLARY  SENTINEL NODE BIOPSY;  Surgeon: Emelia Loron, MD;  Location: [facility] OR;  Service: General;  Laterality: Right;   BREAST IMPLANT REMOVAL Right 09/21/2022   Procedure: REMOVAL BREAST IMPLANTS;  Surgeon: Peggye Form, DO;  Location: [facility];  Service: Plastics;  Laterality: Right;   BREAST RECONSTRUCTION WITH PLACEMENT OF TISSUE EXPANDER AND FLEX HD (ACELLULAR HYDRATED DERMIS) Bilateral 07/08/2020   Procedure: BILATERAL BREAST RECONSTRUCTION WITH  FLEX HD (ACELLULAR HYDRATED DERMIS),DIRECT IMPLANT RECONSTRUCTION;  Surgeon: Allena Napoleon, MD;  Location: [facility] OR;  Service: Plastics;  Laterality: Bilateral;   BREAST SURGERY Right 2015   right lumpectomy with reduction and lift   COLONOSCOPY     DENTAL SURGERY     ESOPHAGOGASTRODUODENOSCOPY     LAPAROSCOPIC GASTRIC SLEEVE RESECTION  2014   LAPAROSCOPIC OOPHERECTOMY Bilateral 2016   LEFT HEART CATH AND CORONARY ANGIOGRAPHY N/A 07/18/2022   Procedure: LEFT HEART CATH AND CORONARY ANGIOGRAPHY;  Surgeon: [name], MD;  Location: [facility] INVASIVE CV LAB;  Service: Cardiovascular;  Laterality: N/A;   PERICARDIOCENTESIS N/A 07/20/2022   Procedure: PERICARDIOCENTESIS;  Surgeon: [name], MD;  Location: [facility] INVASIVE CV  LAB;  Service: Cardiovascular;  Laterality: N/A;   REPLACEMENT TOTAL KNEE Left 2018   also had 3 prior arthroscopies   TOE SURGERY Right    dislocated toe   TOTAL HIP ARTHROPLASTY Right 11/19/2020   Procedure: RIGHT TOTAL HIP ARTHROPLASTY ANTERIOR APPROACH;  Surgeon: Kathryne Hitch, MD;  Location: [facility] OR;  Service: Orthopedics;  Laterality: Right;   TOTAL KNEE ARTHROPLASTY Right 01/09/2022   Procedure: RIGHT TOTAL KNEE ARTHROPLASTY;  Surgeon: Kathryne Hitch, MD;  Location: [facility] ORS;  Service: Orthopedics;  Laterality: Right;   TOTAL MASTECTOMY Bilateral 07/08/2020   Procedure: BILATERAL TOTAL MASTECTOMY;  Surgeon: Emelia Loron, MD;  Location: [facility] OR;  Service: General;  Laterality:  Bilateral;   VENA CAVA FILTER PLACEMENT  2017   Patient Active Problem List   Diagnosis Date Noted   Breast cancer (HCC) 09/21/2022   Acquired absence of breast and absent nipple 08/24/2022   New onset a-fib (HCC) 07/22/2022   Pericardial effusion 07/19/2022   ST elevation 07/18/2022   Pericarditis 07/18/2022   Nail dystrophy 05/05/2022   Status post total right knee replacement 01/09/2022   Iron deficiency anemia 01/23/2021   Status post right hip replacement 12/03/2020   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   H/O total hysterectomy 06/11/2020   Chronic superficial gastritis 05/27/2020   Fibromyalgia 05/27/2020   GERD (gastroesophageal reflux disease) 05/27/2020   Complex regional pain syndrome I, unspecified 05/27/2020   Atypical lobular hyperplasia [facility]) of left breast 05/23/2020   History of therapeutic radiation 05/23/2020   Family history of breast cancer    Family history of colon cancer    Unilateral primary osteoarthritis, right knee 02/26/2020   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   DVT (deep venous thrombosis) (HCC) 05/04/2018   Malignant neoplasm of upper-outer quadrant of right breast in female, estrogen receptor negative (HCC) 05/03/2018   Obstructive sleep apnea (adult) (pediatric) 09/14/2017   Adenopathy 07/15/2017   B12 deficiency 07/15/2017   Disorder of tympanic membrane of right ear 07/15/2017   Recurrent UTI 03/09/2017   Proteinuria 02/25/2017   At risk for obstructive sleep apnea 07/01/2016   Suspected sleep apnea 07/01/2016   Arthritis of left knee 05/26/2016   Localized primary osteoarthritis of lower leg, left 05/26/2016   Clotting disorder (HCC) 05/22/2016   Chronic anticoagulation 01/06/2016   Pelvic and perineal pain 11/28/2013   Anxiety and depression 11/16/2013   H/O bariatric surgery 11/16/2013   Atrophic vaginitis 11/13/2013   Tear of lateral cartilage or meniscus of knee, current 04/14/2013   Allergic rhinitis 01/16/2013   Symptomatic menopausal or  female climacteric states 11/10/2012   Disequilibrium 06/09/2012   Vitamin D deficiency 09/13/2008   Preop examination 04/20/2008   Hypothyroidism 05/20/2007    PCP: Georgann Housekeeper, MD  REFERRING PROVIDER: Laurena Spies, PA-C   REFERRING DIAG: [phone] (ICD-10-CM) - Acquired absence of both breasts and nipples  THERAPY DIAG:  Postmastectomy lymphedema  Disorder of the skin and subcutaneous tissue related to radiation, unspecified  Aftercare following surgery for neoplasm  Abnormal posture  History of malignant neoplasm of right breast  ONSET DATE: 09/21/22  Rationale for Evaluation and Treatment: Rehabilitation  SUBJECTIVE:  SUBJECTIVE STATEMENT: I was developing lymphedema of the arm but it was reversed by wearing the compression sleeve. I then developed capsular contraction of the the R implant and had it removed. I am developing scar tissue adhering it to the chest wall.   PERTINENT HISTORY: 2014 Rt breast cancer with lumpectomy with SLNB, radiation, and chemotherapy. Recurrence on the Rt with bil mastectomy with SLNB on 07/08/20 due to grade 2 ILC ER/PR positive.  Other history includes clotting disorder and DVT history. Had a SOZO score increased of 14.3 on 03/13/21 which decreased back to normal in 04/10/21.  Pt with hx of breast cancer s/p reconstruction recently underwent R breast implant removal 09/21/22 and now experiencing tightening R chest wall and limited ROM of RUE. Has hx of pericarditis with pericardial effusion. 01/2022 R TKA    PAIN:  Are you having pain? Yes NPRS scale: 6/10 Pain location: R axilla, upper arm, chest Pain orientation: Right  PAIN TYPE: burning Pain description: constant  Aggravating factors: wearing the bra Relieving factors: taking the bra off  PRECAUTIONS:  Other: R TKA 2023, hx of pericarditis  RED FLAGS: None   WEIGHT BEARING RESTRICTIONS: no lifting more than 15-20 lbs, no exercise  FALLS:  Has patient fallen in last 6 months? Yes. Number of falls 1 - pt unsure of how she fell, not sure if she tripped  LIVING ENVIRONMENT: Lives with: lives alone Lives in: House/apartment Stairs: Yes; able to live on 1st floor Has following equipment at home: Environmental consultant - 2 wheeled, Crutches, and Grab bars  OCCUPATION: part time, working at [employer]: lower body exercise every other day, 30 min  HAND DOMINANCE: left   PRIOR LEVEL OF FUNCTION: Independent  PATIENT GOALS: figure out what is going on, decrease pain   OBJECTIVE: Note: Objective measures were completed at Evaluation unless otherwise noted.  COGNITION: Overall cognitive status: Within functional limits for tasks assessed   PALPATION: Fluid palpable throughout R chest, increased fibrosis at scar line interior to medial chest  OBSERVATIONS / OTHER ASSESSMENTS: fluid ripples when pressed  POSTURE: forward head, rounded shoulders  UPPER EXTREMITY AROM/PROM:  A/PROM RIGHT   eval   Shoulder extension 72  Shoulder flexion 165  Shoulder abduction 165  Shoulder internal rotation 75  Shoulder external rotation 86    (Blank rows = not tested)  A/PROM LEFT   eval  Shoulder extension 82  Shoulder flexion 168  Shoulder abduction 164  Shoulder internal rotation 70  Shoulder external rotation 87    (Blank rows = not tested)     QUICK DASH SURVEY:   Neldon Mc - 11/11/22 0001     Open a tight or new jar Mild difficulty    Do heavy household chores (wash walls, wash floors) Moderate difficulty    Carry a shopping bag or briefcase Severe difficulty    Wash your back Severe difficulty    Use a knife to cut food No difficulty    Recreational activities in which you take some force or impact through your arm, shoulder, or hand (golf, hammering, tennis) --    unable due to restrictions   During the past week, to what extent has your arm, shoulder or hand problem interfered with your normal social activities with family, friends, neighbors, or groups? Quite a bit    During the past week, to what extent has your arm, shoulder or hand problem limited your work or other regular daily activities Modererately    Arm, shoulder,  or hand pain. Severe    Tingling (pins and needles) in your arm, shoulder, or hand None    Difficulty Sleeping Moderate difficulty    DASH Score 40.91 %            Breat Complaints:  19/80  TODAY'S TREATMENT:                                                                                                                                          DATE:  11/11/22:  Instructed pt in basic MLD technique as follows for R chest lymphedema: L axillary circles, interaxillary pathway, R chest moving fluid towards pathways. Educated pt in anatomy and physiology of the lymphatic system throughout Gentle STM to area of scar tissue/fibrosis at inferior chest Educated about scar tape    PATIENT EDUCATION:  Education details: scar tape, anatomy and physiology of the lymphatic system, self MLD, self massage with tennis ball on wall Person educated: Patient Education method: Chief Technology Officer Education comprehension: verbalized understanding  HOME EXERCISE PROGRAM: Self MLD daily Wear compression 24/7 Can try scar tape  ASSESSMENT:  CLINICAL IMPRESSION: Patient is a 62 y.o. female who was seen today for physical therapy evaluation and treatment for pain and lymphedema across R chest. Pt recently underwent removal of her R breast implant on 09/21/22 due to capsular contraction. She reports that she has had increased tightness and was told by her doctor that scar tissue is adhering to her chest wall. She has nerve pain along posterior upper arm that began recently. She also has back pain and was educated in self soft tissue  mobilization with tennis ball.  She would benefit from skilled PT services to decrease R chest lymphedema, decrease pain and tightness across R chest.    OBJECTIVE IMPAIRMENTS: increased edema, increased fascial restrictions, impaired UE functional use, postural dysfunction, and pain.   ACTIVITY LIMITATIONS: carrying, lifting, and reach over head  PARTICIPATION LIMITATIONS: cleaning, community activity, and occupation  PERSONAL FACTORS: Past/current experiences and Time since onset of injury/illness/exacerbation are also affecting patient's functional outcome.   REHAB POTENTIAL: Good  CLINICAL DECISION MAKING: Stable/uncomplicated  EVALUATION COMPLEXITY: Low  GOALS: Goals reviewed with patient? Yes  SHORT TERM GOALS=LONG TERM GOALS Target date: 12/09/22  Pt will be independent in self MLD for long term management of R chest lymphedema. Baseline: Goal status: INITIAL  2.  Pt will report a 75% improvement in pain in R chest to allow improved comfort.  Baseline:  Goal status: INITIAL  3.  Pt will report a 25% improvement in skin adherence to chest wall to allow improved comfort.  Baseline:  Goal status: INITIAL  PLAN:  PT FREQUENCY: 1x/week  PT DURATION: 4 weeks  PLANNED INTERVENTIONS: Therapeutic exercises, Therapeutic activity, Patient/Family education, Self Care, Joint mobilization, Orthotic/Fit training, Manual lymph drainage, Compression bandaging, scar mobilization, Taping, Vasopneumatic device, and Manual therapy  PLAN FOR NEXT  SESSION: MLD to R chest and assess pt independence, how is scar tape?, STM to scar, did tennis ball help back tightness?  [facility], PT 11/11/2022, 4:04 PM

## 2022-11-11 NOTE — Patient Instructions (Signed)
Self manual lymph drainage: Perform this sequence once a day.  Only give enough pressure no your skin to make the skin move.  Diaphragmatic - Supine   Inhale through nose making navel move out toward hands. Exhale through puckered lips, hands follow navel in. Repeat _5__ times. Rest _10__ seconds between repeats.   Copyright  VHI. All rights reserved.  Hug yourself.  Do circles at your neck just above your collarbones.  Repeat this 10 times.  Axilla - One at a Time   Using full weight of flat hand and fingers at center of uninvolved armpit, make _10__ in-place circles.   Copyright  VHI. All rights reserved.  Axilla to Axilla - Sweep    Now gently stretch skin from the involved side to the uninvolved side across the chest at the shoulder line.  Repeat that 4 times.  Direct fluid upward and inward across your upper chest towards your L arm pit.   Finish by doing the pathways as described above going across your upper chest from the involved shoulder to the uninvolved shoulder.  Repeat the steps above where you do circles in your left armpit. Copyright  VHI. All rights reserved.

## 2022-11-13 ENCOUNTER — Ambulatory Visit: Payer: 59 | Admitting: Physician Assistant

## 2022-11-13 ENCOUNTER — Inpatient Hospital Stay: Payer: 59

## 2022-11-13 VITALS — BP 121/76 | HR 72

## 2022-11-13 DIAGNOSIS — E538 Deficiency of other specified B group vitamins: Secondary | ICD-10-CM

## 2022-11-13 DIAGNOSIS — Z9013 Acquired absence of bilateral breasts and nipples: Secondary | ICD-10-CM

## 2022-11-13 MED ORDER — CYANOCOBALAMIN 1000 MCG/ML IJ SOLN
1000.0000 ug | Freq: Once | INTRAMUSCULAR | Status: AC
  Administered 2022-11-13: 1000 ug via INTRAMUSCULAR
  Filled 2022-11-13: qty 1

## 2022-11-13 NOTE — Progress Notes (Signed)
Patient is a pleasant 62 year old female with PMH of right-sided breast cancer with ipsilateral radiation and bilateral mastectomy with implant-based reconstruction in 2022 now s/p removal of right breast implant performed 09/21/2022 by Dr. Ulice Bold who presents to clinic for postoperative follow-up.  She was last seen here in clinic on 10/30/2022.  At that time, she was reporting some recurrent tightness of the right breast and performing gentle ROM and effort to improve symptoms.  On exam, appear to be scarred down centrally, similar to previous exams.  Possible fluid palpated on superior aspect.  No overlying skin changes.  Aspirated approximately 35 cc dark serosanguineous aspirate from right chest wall.  Recommended continued compression and avoidance of vigorous exercise.  Placed referral to physical therapy.  Recommend that she follow-up in 2 weeks for reevaluation.  Today, patient is doing okay.  She states that she still experiences some tightness on the lateral aspect of right chest wall towards the axilla.  She is unsure if she had lymph node dissection, but does confirm that it is the site of her cancer and radiation treatments.  She saw her physical therapist who thought that she had a fluid collection that need to be aspirated.  Recommended that she hold off on right arm movement given concern for surgical site in interim.  On exam, patient appears to be well-healed from her right sided implant removal.  Per operative report, the capsule was left in place given thinness of skin and appears to have scarred down centrally.  She has what appears to be a ballotable fluid collection on the superior medial aspect of implant removal site, but aspiration attempt did not yield any significant contents and suspect that it is simply how the tissue is arranged in context of scarring.  Area is soft.  Some firmness and scarring over inferior aspect.  No overlying skin changes throughout.  Recommending  mechanical massage of the scarring to help soften the area.  No fluid collections.  Tissue deformity due to implant removal and central scar down causing what appears to be seroma/hematoma.  She is on Eliquis and do not recommend additional aspiration attempts unless exam changes.  She can increase activity as tolerated.  No specific restrictions from our standpoint.  She is not interested in scar revision or flat aesthetic closure at this time given that she has had many recent surgeries.  Instead, she wants to focus on PT and increasing activity.  Will have her follow-up with Dr. Ulice Bold in 6 months to see how she is doing, but otherwise cleared from a postoperative standpoint.  Picture(s) obtained of the patient and placed in the chart were with the patient's or guardian's permission.  Follow-up as needed.

## 2022-11-16 ENCOUNTER — Encounter: Payer: 59 | Admitting: Student

## 2022-11-19 ENCOUNTER — Encounter: Payer: Self-pay | Admitting: Physical Therapy

## 2022-11-19 ENCOUNTER — Inpatient Hospital Stay: Payer: 59

## 2022-11-19 ENCOUNTER — Ambulatory Visit: Payer: 59 | Admitting: Physical Therapy

## 2022-11-19 DIAGNOSIS — Z483 Aftercare following surgery for neoplasm: Secondary | ICD-10-CM

## 2022-11-19 DIAGNOSIS — E538 Deficiency of other specified B group vitamins: Secondary | ICD-10-CM

## 2022-11-19 DIAGNOSIS — I972 Postmastectomy lymphedema syndrome: Secondary | ICD-10-CM | POA: Diagnosis not present

## 2022-11-19 DIAGNOSIS — R293 Abnormal posture: Secondary | ICD-10-CM

## 2022-11-19 DIAGNOSIS — L599 Disorder of the skin and subcutaneous tissue related to radiation, unspecified: Secondary | ICD-10-CM

## 2022-11-19 DIAGNOSIS — Z853 Personal history of malignant neoplasm of breast: Secondary | ICD-10-CM

## 2022-11-19 MED ORDER — CYANOCOBALAMIN 1000 MCG/ML IJ SOLN
1000.0000 ug | Freq: Once | INTRAMUSCULAR | Status: AC
  Administered 2022-11-19: 1000 ug via INTRAMUSCULAR
  Filled 2022-11-19: qty 1

## 2022-11-19 NOTE — Therapy (Addendum)
 OUTPATIENT PHYSICAL THERAPY  UPPER EXTREMITY ONCOLOGY TREATMENT  Patient Name: Sierra Perez MRN: [id number] DOB:05-30-1960, 62 y.o., female Today's Date: 11/19/2022  END OF SESSION:  PT End of Session - 11/19/22 1500     Visit Number 2    Number of Visits 5    Date for PT Re-Evaluation 12/09/22    Authorization Time Period pt reports that there will be 4 visits left after today (Sierra Perez called and they said more but previously when she was here she had been told she had used most of her visits)    PT Start Time 1500    PT Stop Time 1555    PT Time Calculation (min) 55 min    Activity Tolerance Patient tolerated treatment well    Behavior During Therapy WFL for tasks assessed/performed             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, her-2 positive, ER/PR negative   Clotting disorder (HCC)    testing was negative, h/o DVT while on treatment   Colon polyps    Complex regional pain syndrome i of right lower limb    RDS   Complication of anesthesia    Cystitis    Diabetes mellitus without complication (HCC)    DVT (deep venous thrombosis) (HCC)    started in 2015, multiple   Dysrhythmia    Family history of breast cancer    Family history of colon cancer    Fibroid    Fibromyalgia    GERD (gastroesophageal reflux disease)    Heart murmur    HLD (hyperlipidemia)    HPV in female    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   Hypertension    Hypothyroidism    IBS (irritable bowel syndrome)    Mitral valve prolapse    Peptic ulcer    Personal history of malignant neoplasm of breast    Sleep apnea treated with continuous positive airway pressure (CPAP)    Past Surgical History:  Procedure Laterality Date   ABDOMINAL HYSTERECTOMY  2010   fibroids, h/o abnormal pap smears   AXILLARY SENTINEL NODE BIOPSY Right 07/08/2020   Procedure: RIGHT AXILLARY  SENTINEL NODE BIOPSY;  Surgeon: Ebbie Cough, MD;  Location: [facility] OR;  Service: General;  Laterality: Right;   BREAST IMPLANT REMOVAL Right 09/21/2022   Procedure: REMOVAL BREAST IMPLANTS;  Surgeon: Lowery Estefana RAMAN, DO;  Location: [facility];  Service: Plastics;  Laterality: Right;   BREAST RECONSTRUCTION WITH PLACEMENT OF TISSUE EXPANDER AND FLEX HD (ACELLULAR HYDRATED DERMIS) Bilateral 07/08/2020   Procedure: BILATERAL BREAST RECONSTRUCTION WITH  FLEX HD (ACELLULAR HYDRATED DERMIS),DIRECT IMPLANT RECONSTRUCTION;  Surgeon: Elisabeth Craig RAMAN, MD;  Location: [facility] OR;  Service: Plastics;  Laterality: Bilateral;   BREAST SURGERY Right 2015   right lumpectomy with reduction and lift   COLONOSCOPY     DENTAL SURGERY     ESOPHAGOGASTRODUODENOSCOPY     LAPAROSCOPIC GASTRIC SLEEVE RESECTION  2014   LAPAROSCOPIC OOPHERECTOMY Bilateral 2016   LEFT HEART CATH AND CORONARY ANGIOGRAPHY N/A 07/18/2022   Procedure: LEFT HEART CATH AND CORONARY ANGIOGRAPHY;  Surgeon: Anner Alm ORN, MD;  Location: [facility] INVASIVE CV LAB;  Service: Cardiovascular;  Laterality: N/A;   PERICARDIOCENTESIS N/A 07/20/2022   Procedure: PERICARDIOCENTESIS;  Surgeon: Anner Alm ORN, MD;  Location: [facility] INVASIVE CV  LAB;  Service: Cardiovascular;  Laterality: N/A;   REPLACEMENT TOTAL KNEE Left 2018   also had 3 prior arthroscopies   TOE SURGERY Right    dislocated toe   TOTAL HIP ARTHROPLASTY Right 11/19/2020   Procedure: RIGHT TOTAL HIP ARTHROPLASTY ANTERIOR APPROACH;  Surgeon: Vernetta Lonni GRADE, MD;  Location: [facility] OR;  Service: Orthopedics;  Laterality: Right;   TOTAL KNEE ARTHROPLASTY Right 01/09/2022   Procedure: RIGHT TOTAL KNEE ARTHROPLASTY;  Surgeon: Vernetta Lonni GRADE, MD;  Location: [facility] ORS;  Service: Orthopedics;  Laterality: Right;   TOTAL MASTECTOMY Bilateral 07/08/2020   Procedure: BILATERAL TOTAL MASTECTOMY;  Surgeon: Ebbie Cough, MD;  Location: [facility] OR;  Service: General;  Laterality:  Bilateral;   VENA CAVA FILTER PLACEMENT  2017   Patient Active Problem List   Diagnosis Date Noted   Breast cancer (HCC) 09/21/2022   Acquired absence of breast and absent nipple 08/24/2022   New onset a-fib (HCC) 07/22/2022   Pericardial effusion 07/19/2022   ST elevation 07/18/2022   Pericarditis 07/18/2022   Nail dystrophy 05/05/2022   Status post total right knee replacement 01/09/2022   Iron  deficiency anemia 01/23/2021   Status post right hip replacement 12/03/2020   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   H/O total hysterectomy 06/11/2020   Chronic superficial gastritis 05/27/2020   Fibromyalgia 05/27/2020   GERD (gastroesophageal reflux disease) 05/27/2020   Complex regional pain syndrome I, unspecified 05/27/2020   Atypical lobular hyperplasia [facility]) of left breast 05/23/2020   History of therapeutic radiation 05/23/2020   Family history of breast cancer    Family history of colon cancer    Unilateral primary osteoarthritis, right knee 02/26/2020   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   DVT (deep venous thrombosis) (HCC) 05/04/2018   Malignant neoplasm of upper-outer quadrant of right breast in female, estrogen receptor negative (HCC) 05/03/2018   Obstructive sleep apnea (adult) (pediatric) 09/14/2017   Adenopathy 07/15/2017   B12 deficiency 07/15/2017   Disorder of tympanic membrane of right ear 07/15/2017   Recurrent UTI 03/09/2017   Proteinuria 02/25/2017   At risk for obstructive sleep apnea 07/01/2016   Suspected sleep apnea 07/01/2016   Arthritis of left knee 05/26/2016   Localized primary osteoarthritis of lower leg, left 05/26/2016   Clotting disorder (HCC) 05/22/2016   Chronic anticoagulation 01/06/2016   Pelvic and perineal pain 11/28/2013   Anxiety and depression 11/16/2013   H/O bariatric surgery 11/16/2013   Atrophic vaginitis 11/13/2013   Tear of lateral cartilage or meniscus of knee, current 04/14/2013   Allergic rhinitis 01/16/2013   Symptomatic menopausal or  female climacteric states 11/10/2012   Disequilibrium 06/09/2012   Vitamin D  deficiency 09/13/2008   Preop examination 04/20/2008   Hypothyroidism 05/20/2007    PCP: Ardell Manly, MD  REFERRING PROVIDER: Estefana FORBES Peck, PA-C   REFERRING DIAG: [phone] (ICD-10-CM) - Acquired absence of both breasts and nipples  THERAPY DIAG:  Postmastectomy lymphedema  Disorder of the skin and subcutaneous tissue related to radiation, unspecified  Aftercare following surgery for neoplasm  Abnormal posture  History of malignant neoplasm of right breast  ONSET DATE: 09/21/22  Rationale for Evaluation and Treatment: Rehabilitation  SUBJECTIVE:  SUBJECTIVE STATEMENT: I am not having any pain today. I just want exercises I can do at home.   PERTINENT HISTORY: 2014 Rt breast cancer with lumpectomy with SLNB, radiation, and chemotherapy. Recurrence on the Rt with bil mastectomy with SLNB on 07/08/20 due to grade 2 ILC ER/PR positive.  Other history includes clotting disorder and DVT history. Had a SOZO score increased of 14.3 on 03/13/21 which decreased back to normal in 04/10/21.  Pt with hx of breast cancer s/p reconstruction recently underwent R breast implant removal 09/21/22 and now experiencing tightening R chest wall and limited ROM of RUE. Has hx of pericarditis with pericardial effusion. 01/2022 R TKA    PAIN:  Are you having pain? No   PRECAUTIONS: Other: R TKA 2023, hx of pericarditis  RED FLAGS: None   WEIGHT BEARING RESTRICTIONS: no lifting more than 15-20 lbs, no exercise  FALLS:  Has patient fallen in last 6 months? Yes. Number of falls 1 - pt unsure of how she fell, not sure if she tripped  LIVING ENVIRONMENT: Lives with: lives alone Lives in: House/apartment Stairs: Yes; able to live on 1st  floor Has following equipment at home: Environmental consultant - 2 wheeled, Crutches, and Grab bars  OCCUPATION: part time, working at [employer]: lower body exercise every other day, 30 min  HAND DOMINANCE: left   PRIOR LEVEL OF FUNCTION: Independent  PATIENT GOALS: figure out what is going on, decrease pain   OBJECTIVE: Note: Objective measures were completed at Evaluation unless otherwise noted.  COGNITION: Overall cognitive status: Within functional limits for tasks assessed   PALPATION: Fluid palpable throughout R chest, increased fibrosis at scar line interior to medial chest  OBSERVATIONS / OTHER ASSESSMENTS: fluid ripples when pressed  POSTURE: forward head, rounded shoulders  UPPER EXTREMITY AROM/PROM:  A/PROM RIGHT   eval   Shoulder extension 72  Shoulder flexion 165  Shoulder abduction 165  Shoulder internal rotation 75  Shoulder external rotation 86    (Blank rows = not tested)  A/PROM LEFT   eval  Shoulder extension 82  Shoulder flexion 168  Shoulder abduction 164  Shoulder internal rotation 70  Shoulder external rotation 87    (Blank rows = not tested)     QUICK DASH SURVEY:    Breat Complaints:  19/80  TODAY'S TREATMENT:                                                                                                                                          DATE:  11/19/22: Supine on large mat: snow angels x 10 but not much stretch felt  Supine over foam roll as follows for pec stretch: alternating flexion x 10, alternating scaption x 5 each with 30 sec holds, open book x 5, snow angels x 10 moving slowly for stretch - pt returned therapist demo of each Door way stretch x 30  sec holds x 5 Measured pt for a new compression sleeve and issued info on how to obtain Sigvaris Secure Small Avg Long sleeve  Repeated SOZO to get a baseline since pt will be flying tomorrow and does not have a sleeve that fits  L-DEX FLOWSHEETS - 11/19/22 1500        L-DEX LYMPHEDEMA SCREENING   Measurement Type Unilateral    L-DEX MEASUREMENT EXTREMITY Upper Extremity    POSITION  Standing    DOMINANT SIDE Left    At Risk Side Right    BASELINE SCORE (UNILATERAL) 5.9    L-DEX SCORE (UNILATERAL) 1.8    VALUE CHANGE (UNILAT) -4.1            The patient was assessed using the L-Dex machine today to produce a lymphedema index baseline score. The patient will be reassessed on a regular basis (typically every 3 months) to obtain new L-Dex scores. If the score is > 6.5 points away from his/her baseline score indicating onset of subclinical lymphedema, it will be recommended to wear a compression garment for 4 weeks, 12 hours per day and then be reassessed. If the score continues to be > 6.5 points from baseline at reassessment, we will initiate lymphedema treatment. Assessing in this manner has a 95% rate of preventing clinically significant lymphedema.    11/11/22:  Instructed pt in basic MLD technique as follows for R chest lymphedema: L axillary circles, interaxillary pathway, R chest moving fluid towards pathways. Educated pt in anatomy and physiology of the lymphatic system throughout Gentle STM to area of scar tissue/fibrosis at inferior chest Educated about scar tape    PATIENT EDUCATION:  Education details: new HEP, how to progress exercises at the gym Person educated: Patient Education method: Chief Technology Officer Education comprehension: verbalized understanding  HOME EXERCISE PROGRAM: Self MLD daily Wear compression 24/7 Can try scar tape  Access Code: [email or web address] URL: [email or web address] Date: 11/19/2022 Prepared by: Florina Lanis Carbon  Exercises - Supine Chest Stretch on Foam Roll  - 1 x daily - 7 x weekly - 1 sets - 5 reps - 30 sec hold - Thoracic Foam Roll Mobilization Backstroke  - 1 x daily - 7 x weekly - 1 sets - 10 reps - Snow Angels on Foam Roll  - 1 x daily - 7 x weekly - 1 sets - 10 reps - Open Book  Chest Rotation Stretch on Foam 1/2 Roll  - 1 x daily - 7 x weekly - 1 sets - 5 reps - 30 sec hold - Thoracic Y on Foam Roll  - 1 x daily - 7 x weekly - 1 sets - 10 reps - Doorway Pec Stretch at 90 Degrees Abduction  - 1 x daily - 7 x weekly - 1 sets - 3-5 reps - 30 sec hold ASSESSMENT:  CLINICAL IMPRESSION: Began instructing pt in new pec stretching exercises using foam roll as well as doorway stretch. Added all of these to a home exercise program for pt to begin to do at home. Measured her for a new compression sleeve today since her old one is now too large. Pt felt a good stretch will all exercises. Educated her to use a rolled up towel at home since she does not have a foam roll.    OBJECTIVE IMPAIRMENTS: increased edema, increased fascial restrictions, impaired UE functional use, postural dysfunction, and pain.   ACTIVITY LIMITATIONS: carrying, lifting, and reach over head  PARTICIPATION LIMITATIONS: cleaning, community activity, and occupation  PERSONAL FACTORS: Past/current experiences and Time since onset of injury/illness/exacerbation are also affecting patient's functional outcome.   REHAB POTENTIAL: Good  CLINICAL DECISION MAKING: Stable/uncomplicated  EVALUATION COMPLEXITY: Low  GOALS: Goals reviewed with patient? Yes  SHORT TERM GOALS=LONG TERM GOALS Target date: 12/09/22  Pt will be independent in self MLD for long term management of R chest lymphedema. Baseline: Goal status: INITIAL  2.  Pt will report a 75% improvement in pain in R chest to allow improved comfort.  Baseline:  Goal status: INITIAL  3.  Pt will report a 25% improvement in skin adherence to chest wall to allow improved comfort.  Baseline:  Goal status: INITIAL  PLAN:  PT FREQUENCY: 1x/week  PT DURATION: 4 weeks  PLANNED INTERVENTIONS: Therapeutic exercises, Therapeutic activity, Patient/Family education, Self Care, Joint mobilization, Orthotic/Fit training, Manual lymph drainage, Compression  bandaging, scar mobilization, Taping, Vasopneumatic device, and Manual therapy  PLAN FOR NEXT SESSION: how are new exercises? MLD to R chest and assess pt independence, how is scar tape?, STM to scar, did tennis ball help back tightness?  Florina Sever Crook, PT 11/19/2022, 4:00 PM   PHYSICAL THERAPY DISCHARGE SUMMARY  Visits from Start of Care: 2  Current functional level related to goals / functional outcomes: See above   Remaining deficits: See above   Education / Equipment: HEP, scar massage   Patient agrees to discharge. Patient goals were not met. Patient is being discharged due to not returning since the last visit.  Florina Sever Barrera, [place] 09/30/23 4:24 PM

## 2022-11-25 ENCOUNTER — Ambulatory Visit: Payer: 59 | Admitting: Physical Therapy

## 2022-12-02 ENCOUNTER — Encounter: Payer: 59 | Admitting: Physical Therapy

## 2022-12-08 ENCOUNTER — Encounter: Payer: 59 | Admitting: Physical Therapy

## 2022-12-14 ENCOUNTER — Ambulatory Visit: Payer: 59 | Attending: Hematology and Oncology

## 2022-12-14 VITALS — Wt 155.5 lb

## 2022-12-14 DIAGNOSIS — Z483 Aftercare following surgery for neoplasm: Secondary | ICD-10-CM | POA: Insufficient documentation

## 2022-12-14 NOTE — Therapy (Signed)
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, her-2 positive, ER/PR negative   Clotting disorder (HCC)    testing was negative, h/o DVT while on treatment   Colon polyps    Complex regional pain syndrome i of right lower limb    RDS   Complication of anesthesia    Cystitis    Diabetes mellitus without complication (HCC)    DVT (deep venous thrombosis) (HCC)    started in 2015, multiple   Dysrhythmia    Family history of breast cancer    Family history of colon cancer    Fibroid    Fibromyalgia    GERD (gastroesophageal reflux disease)    Heart murmur    HLD (hyperlipidemia)    HPV in female    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   Hypertension    Hypothyroidism    IBS (irritable bowel syndrome)    Mitral valve prolapse    Peptic ulcer    Personal history of malignant neoplasm of breast    Sleep apnea treated with continuous positive airway pressure (CPAP)    Past Surgical History:  Procedure Laterality Date   ABDOMINAL HYSTERECTOMY  2010   fibroids, h/o abnormal pap smears   AXILLARY SENTINEL NODE BIOPSY Right 07/08/2020   Procedure: RIGHT AXILLARY SENTINEL NODE BIOPSY;  Surgeon: Emelia Loron, MD;  Location: [facility] OR;  Service: General;  Laterality: Right;   BREAST IMPLANT REMOVAL Right 09/21/2022   Procedure: REMOVAL BREAST IMPLANTS;  Surgeon:  Peggye Form, DO;  Location: [facility];  Service: Plastics;  Laterality: Right;   BREAST RECONSTRUCTION WITH PLACEMENT OF TISSUE EXPANDER AND FLEX HD (ACELLULAR HYDRATED DERMIS) Bilateral 07/08/2020   Procedure: BILATERAL BREAST RECONSTRUCTION WITH  FLEX HD (ACELLULAR HYDRATED DERMIS),DIRECT IMPLANT RECONSTRUCTION;  Surgeon: Allena Napoleon, MD;  Location: [facility] OR;  Service: Plastics;  Laterality: Bilateral;   BREAST SURGERY Right 2015   right lumpectomy with reduction and lift   COLONOSCOPY     DENTAL SURGERY     ESOPHAGOGASTRODUODENOSCOPY     LAPAROSCOPIC GASTRIC SLEEVE RESECTION  2014   LAPAROSCOPIC OOPHERECTOMY Bilateral 2016   LEFT HEART CATH AND CORONARY ANGIOGRAPHY N/A 07/18/2022   Procedure: LEFT HEART CATH AND CORONARY ANGIOGRAPHY;  Surgeon: [name], MD;  Location: [facility] INVASIVE CV LAB;  Service: Cardiovascular;  Laterality: N/A;   PERICARDIOCENTESIS N/A 07/20/2022   Procedure: PERICARDIOCENTESIS;  Surgeon: [name], MD;  Location: [facility] INVASIVE CV LAB;  Service: Cardiovascular;  Laterality: N/A;   REPLACEMENT TOTAL KNEE Left 2018   also had 3 prior arthroscopies   TOE SURGERY Right    dislocated toe   TOTAL HIP ARTHROPLASTY Right 11/19/2020   Procedure: RIGHT  TOTAL HIP ARTHROPLASTY ANTERIOR APPROACH;  Surgeon: Kathryne Hitch, MD;  Location: [facility] OR;  Service: Orthopedics;  Laterality: Right;   TOTAL KNEE ARTHROPLASTY Right 01/09/2022   Procedure: RIGHT TOTAL KNEE ARTHROPLASTY;  Surgeon: Kathryne Hitch, MD;  Location: [facility] ORS;  Service: Orthopedics;  Laterality: Right;   TOTAL MASTECTOMY Bilateral 07/08/2020   Procedure: BILATERAL TOTAL MASTECTOMY;  Surgeon: Emelia Loron, MD;  Location: [facility] OR;  Service: General;  Laterality: Bilateral;   VENA CAVA FILTER PLACEMENT  2017   Patient Active Problem List   Diagnosis Date Noted   Breast cancer (HCC) 09/21/2022   Acquired absence of breast and absent nipple 08/24/2022   New onset  a-fib (HCC) 07/22/2022   Pericardial effusion 07/19/2022   ST elevation 07/18/2022   Pericarditis 07/18/2022   Nail dystrophy 05/05/2022   Status post total right knee replacement 01/09/2022   Iron deficiency anemia 01/23/2021   Status post right hip replacement 12/03/2020   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   H/O total hysterectomy 06/11/2020   Chronic superficial gastritis 05/27/2020   Fibromyalgia 05/27/2020   GERD (gastroesophageal reflux disease) 05/27/2020   Complex regional pain syndrome I, unspecified 05/27/2020   Atypical lobular hyperplasia [facility]) of left breast 05/23/2020   History of therapeutic radiation 05/23/2020   Family history of breast cancer    Family history of colon cancer    Unilateral primary osteoarthritis, right knee 02/26/2020   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   DVT (deep venous thrombosis) (HCC) 05/04/2018   Malignant neoplasm of upper-outer quadrant of right breast in female, estrogen receptor negative (HCC) 05/03/2018   Obstructive sleep apnea (adult) (pediatric) 09/14/2017   Adenopathy 07/15/2017   B12 deficiency 07/15/2017   Disorder of tympanic membrane of right ear 07/15/2017   Recurrent UTI 03/09/2017   Proteinuria 02/25/2017   At risk for obstructive sleep apnea 07/01/2016   Suspected sleep apnea 07/01/2016   Arthritis of left knee 05/26/2016   Localized primary osteoarthritis of lower leg, left 05/26/2016   Clotting disorder (HCC) 05/22/2016   Chronic anticoagulation 01/06/2016   Pelvic and perineal pain 11/28/2013   Anxiety and depression 11/16/2013   H/O bariatric surgery 11/16/2013   Atrophic vaginitis 11/13/2013   Tear of lateral cartilage or meniscus of knee, current 04/14/2013   Allergic rhinitis 01/16/2013   Symptomatic menopausal or female climacteric states 11/10/2012   Disequilibrium 06/09/2012   Vitamin D deficiency 09/13/2008   Preop examination 04/20/2008   Hypothyroidism 05/20/2007    REFERRING DIAG: right breast cancer at  risk for lymphedema  THERAPY DIAG: Aftercare following surgery for neoplasm  PERTINENT HISTORY: 2014 Rt breast cancer with lumpectomy with SLNB, radiation, and chemotherapy. Recurrence on the Rt with bil mastectomy with SLNB on 07/08/20 due to grade 2 ILC ER/PR positive.  Other history includes clotting disorder and DVT history. Had a SOZO score increased of 14.3 on 03/13/21 which decreased back to normal in 04/10/21.  Pt with hx of breast cancer s/p reconstruction recently underwent R breast implant removal 09/21/22 and now experiencing tightening R chest wall and limited ROM of RUE. Has hx of pericarditis with pericardial effusion. 01/2022 R TKA   PRECAUTIONS: right UE Lymphedema risk, None  SUBJECTIVE: Pt returns for her 3 month L-Dex screen.   PAIN:  Are you having pain? No  SOZO SCREENING: Patient was assessed today using the SOZO machine to determine the lymphedema index score. This was compared to her baseline score. It was determined that she is within the recommended range when compared  to her baseline and no further action is needed at this time. She will continue SOZO screenings. These are done every 3 months for 2 years post operatively followed by every 6 months for 2 years, and then annually.   L-DEX FLOWSHEETS - 12/14/22 1600       L-DEX LYMPHEDEMA SCREENING   Measurement Type Unilateral    L-DEX MEASUREMENT EXTREMITY Upper Extremity    POSITION  Standing    DOMINANT SIDE Left    At Risk Side Right    BASELINE SCORE (UNILATERAL) 5.9    L-DEX SCORE (UNILATERAL) 1.4    VALUE CHANGE (UNILAT) -4.5               Hermenia Bers, PTA 12/14/2022, 4:34 PM

## 2022-12-18 ENCOUNTER — Inpatient Hospital Stay: Payer: 59

## 2022-12-18 ENCOUNTER — Telehealth: Payer: Self-pay

## 2022-12-18 ENCOUNTER — Inpatient Hospital Stay: Payer: 59 | Attending: Hematology and Oncology | Admitting: Hematology and Oncology

## 2022-12-18 ENCOUNTER — Ambulatory Visit: Payer: 59 | Admitting: Hematology and Oncology

## 2022-12-18 VITALS — BP 106/52 | HR 73 | Temp 98.0°F | Resp 18 | Ht 68.0 in | Wt 157.1 lb

## 2022-12-18 DIAGNOSIS — Z79624 Long term (current) use of inhibitors of nucleotide synthesis: Secondary | ICD-10-CM | POA: Diagnosis not present

## 2022-12-18 DIAGNOSIS — C50411 Malignant neoplasm of upper-outer quadrant of right female breast: Secondary | ICD-10-CM | POA: Diagnosis not present

## 2022-12-18 DIAGNOSIS — Z79899 Other long term (current) drug therapy: Secondary | ICD-10-CM | POA: Diagnosis not present

## 2022-12-18 DIAGNOSIS — D509 Iron deficiency anemia, unspecified: Secondary | ICD-10-CM | POA: Insufficient documentation

## 2022-12-18 DIAGNOSIS — E538 Deficiency of other specified B group vitamins: Secondary | ICD-10-CM

## 2022-12-18 DIAGNOSIS — Z923 Personal history of irradiation: Secondary | ICD-10-CM | POA: Insufficient documentation

## 2022-12-18 DIAGNOSIS — Z79811 Long term (current) use of aromatase inhibitors: Secondary | ICD-10-CM | POA: Diagnosis not present

## 2022-12-18 DIAGNOSIS — Z171 Estrogen receptor negative status [ER-]: Secondary | ICD-10-CM

## 2022-12-18 LAB — CBC WITH DIFFERENTIAL (CANCER CENTER ONLY)
Abs Immature Granulocytes: 0.02 10*3/uL (ref 0.00–0.07)
Basophils Absolute: 0 10*3/uL (ref 0.0–0.1)
Basophils Relative: 0 %
Eosinophils Absolute: 0.1 10*3/uL (ref 0.0–0.5)
Eosinophils Relative: 2 %
HCT: 35.5 % — ABNORMAL LOW (ref 36.0–46.0)
Hemoglobin: 11.8 g/dL — ABNORMAL LOW (ref 12.0–15.0)
Immature Granulocytes: 0 %
Lymphocytes Relative: 31 %
Lymphs Abs: 1.6 10*3/uL (ref 0.7–4.0)
MCH: 31 pg (ref 26.0–34.0)
MCHC: 33.2 g/dL (ref 30.0–36.0)
MCV: 93.2 fL (ref 80.0–100.0)
Monocytes Absolute: 0.5 10*3/uL (ref 0.1–1.0)
Monocytes Relative: 9 %
Neutro Abs: 3.1 10*3/uL (ref 1.7–7.7)
Neutrophils Relative %: 58 %
Platelet Count: 212 10*3/uL (ref 150–400)
RBC: 3.81 MIL/uL — ABNORMAL LOW (ref 3.87–5.11)
RDW: 13.2 % (ref 11.5–15.5)
WBC Count: 5.3 10*3/uL (ref 4.0–10.5)
nRBC: 0 % (ref 0.0–0.2)

## 2022-12-18 LAB — IRON AND IRON BINDING CAPACITY (CC-WL,HP ONLY)
Iron: 31 ug/dL (ref 28–170)
Saturation Ratios: 8 % — ABNORMAL LOW (ref 10.4–31.8)
TIBC: 372 ug/dL (ref 250–450)
UIBC: 341 ug/dL (ref 148–442)

## 2022-12-18 LAB — FERRITIN: Ferritin: 28 ng/mL (ref 11–307)

## 2022-12-18 LAB — CMP (CANCER CENTER ONLY)
ALT: 22 U/L (ref 0–44)
AST: 23 U/L (ref 15–41)
Albumin: 4.2 g/dL (ref 3.5–5.0)
Alkaline Phosphatase: 75 U/L (ref 38–126)
Anion gap: 5 (ref 5–15)
BUN: 20 mg/dL (ref 8–23)
CO2: 28 mmol/L (ref 22–32)
Calcium: 9.6 mg/dL (ref 8.9–10.3)
Chloride: 106 mmol/L (ref 98–111)
Creatinine: 0.97 mg/dL (ref 0.44–1.00)
GFR, Estimated: >60 mL/min (ref >60)
Glucose, Bld: 85 mg/dL (ref 70–99)
Potassium: 3.9 mmol/L (ref 3.5–5.1)
Sodium: 139 mmol/L (ref 135–145)
Total Bilirubin: 0.7 mg/dL (ref <1.2)
Total Protein: 6.9 g/dL (ref 6.5–8.1)

## 2022-12-18 LAB — VITAMIN B12: Vitamin B-12: 2256 pg/mL — ABNORMAL HIGH (ref 180–914)

## 2022-12-18 NOTE — Progress Notes (Signed)
Patient Care Team: Georgann Housekeeper, MD as PCP - General (Internal Medicine) [name], MD as PCP - Cardiology (Cardiology) Donnelly Angelica, RN as Oncology Nurse Navigator Pershing Proud, RN as Oncology Nurse Navigator [name], MD as Consulting Physician (Hematology and Oncology)  DIAGNOSIS:  Encounter Diagnoses  Name Primary?   Malignant neoplasm of upper-outer quadrant of right breast in female, estrogen receptor negative (HCC) Yes   Vitamin B 12 deficiency    Iron deficiency anemia, unspecified iron deficiency anemia type     SUMMARY OF ONCOLOGIC HISTORY: 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-, PR-, HER positive 3+, Ki67 20-25%   08/27/2012 Cancer Staging   Staging form: Breast, AJCC 8th Edition - Clinical stage from 08/27/2012: Stage IIA (cT2, cN0, cM0, G3, ER-, PR-, HER2+) - Signed by [name], MD on 11/17/2019   09/06/2012 Breast MRI   Right breast, lateral to the nipple, 10x14x53mm mass with mildly irregular borders   09/10/2012 Echocardiogram   EF 55-60%    Chemotherapy   Neoadjuvant Taxotere/Carboplatin/Herceptin/Perjeta followed by adjuvant Herceptin maintenance   01/09/2013 Breast MRI   Complete pathological response to neoadjuvant treatment: right breast mass previously noted at 9:00 position no longer identified.   02/13/2013 Surgery   Right breast lumpectomy: benign parenchyma with some stromal fibrosis consistent with previous tumor site, focal area of atypical lobular hyperplasia, all lymph nodes negative.     Radiation Therapy   Adjuvant radiation   04/06/2013 Imaging   DEXA: T-score of -1.1, osteopenia    09/25/2016 Initial Biopsy   Columnar cell alteration with atypia, foreign body granuloma   05/10/2020 Relapse/Recurrence   Breast MRI detected new linear non-mass enhancement right breast  2.1 cm, non-mass enhancement centrally left breast spanning 10.5 cm, left breast nipple areolar complex enhancement: Biopsy right breast: Grade 2 invasive lobular cancer.  ER/PR positive HER-2 equivocal by IHC FISH pending, Ki-67 1% left breast biopsy anterior fibrocystic change, posterior atypical lobular hyperplasia/LCIS   05/26/2020 Genetic Testing   Negative genetic testing on the CancerNext-Expanded+RNAinsight panel.  The CancerNext-Expanded gene panel offered by [facility] and includes sequencing and rearrangement analysis for the following 77 genes: AIP, ALK, APC*, ATM*, AXIN2, BAP1, BARD1, BLM, BMPR1A, BRCA1*, BRCA2*, BRIP1*, CDC73, CDH1*, CDK4, CDKN1B, CDKN2A, CHEK2*, CTNNA1, DICER1, FANCC, FH, FLCN, GALNT12, KIF1B, LZTR1, MAX, MEN1, MET, MLH1*, MSH2*, MSH3, MSH6*, MUTYH*, NBN, NF1*, NF2, NTHL1, PALB2*, PHOX2B, PMS2*, POT1, PRKAR1A, PTCH1, PTEN*, RAD51C*, RAD51D*, RB1, RECQL, RET, SDHA, SDHAF2, SDHB, SDHC, SDHD, SMAD4, SMARCA4, SMARCB1, SMARCE1, STK11, SUFU, TMEM127, TP53*, TSC1, TSC2, VHL and XRCC2 (sequencing and deletion/duplication); EGFR, EGLN1, HOXB13, KIT, MITF, PDGFRA, POLD1, and POLE (sequencing only); EPCAM and GREM1 (deletion/duplication only). DNA and RNA analyses performed for * genes. The report date is May 26, 2020.    07/08/2020 Surgery   Bilateral mastectomies with reconstruction: Right mastectomy: Grade 2 ILC 0.9 cm, ALH, margins negative, 0/1 lymph, left mastectomy: Benign    08/2020 -  Anti-estrogen oral therapy   adjuvant antiestrogen therapy with anastrozole 1 mg daily x7 years started July 2022      CHIEF COMPLIANT: Follow-up on B12 deficiency anemia and history of breast cancer on anastrozole therapy  HISTORY OF PRESENT ILLNESS:   History of Present Illness   The patient, with a history of breast cancer, presents for a routine follow-up. She reports recent health issues including a  severe case of pericarditis, which has since been resolved. She also experienced  complications with a breast implant, resulting in its removal. The patient mentions discomfort due to scar tissue from the implant removal. She has been on blood thinners and is currently taking B12 supplements.  The patient also reports significant changes in vision, particularly with distance vision. She has stopped driving at night due to this issue. Her glasses are no longer effective, and she has been advised not to use them. The cause of the vision changes is currently unknown, but the patient is scheduled to see an ophthalmologist for further evaluation.  The patient has been trying to maintain an active lifestyle, including going to the gym. However, she expresses some concerns about potential lymphedema recurrence with certain exercises. She also mentions a desire to explore new hobbies and activities, such as hiking and art.         ALLERGIES:  is allergic to succinylcholine, azithromycin, keflex [cephalexin], silver, sulfa antibiotics, and sulfasalazine.  MEDICATIONS:  Current Outpatient Medications  Medication Sig Dispense Refill   acetaminophen (TYLENOL) 500 MG tablet Take 1,000 mg by mouth every 6 (six) hours as needed for moderate pain.     albuterol (VENTOLIN HFA) 108 (90 Base) MCG/ACT inhaler Inhale 1 puff into the lungs every 6 (six) hours as needed for wheezing or shortness of breath.     anastrozole (ARIMIDEX) 1 MG tablet TAKE 1 TABLET(1 MG) BY MOUTH DAILY (Patient taking differently: Take 1 mg by mouth daily.) 90 tablet 3   apixaban (ELIQUIS) 5 MG TABS tablet Take 1 tablet (5 mg total) by mouth 2 (two) times daily. Resume 07/22/22 PM dose. 180 tablet 2   Bacillus Coagulans-Inulin (PROBIOTIC) 1-250 BILLION-MG CAPS Take 250 mg by mouth daily.     buPROPion (WELLBUTRIN XL) 150 MG 24 hr tablet Take 150 mg by mouth daily.     CALCIUM CITRATE PO Take 3 tablets by mouth daily.     Cholecalciferol (VITAMIN D-3) 25 MCG (1000 UT) CAPS Take 1,000 Units by mouth daily.     DULoxetine  (CYMBALTA) 60 MG capsule Take 60 mg by mouth daily.     fluticasone (FLONASE) 50 MCG/ACT nasal spray Place 2 sprays into both nostrils daily.     gabapentin (NEURONTIN) 600 MG tablet TAKE 1 TABLET(600 MG) BY MOUTH THREE TIMES DAILY (Patient taking differently: Take 600 mg by mouth 2 (two) times daily.) 90 tablet 0   ipratropium (ATROVENT) 0.03 % nasal spray Place 2 sprays into both nostrils 2 (two) times daily.     levothyroxine (SYNTHROID) 50 MCG tablet Take 50 mcg by mouth every morning.     ondansetron (ZOFRAN) 4 MG tablet Take 1 tablet (4 mg total) by mouth every 8 (eight) hours as needed for up to 20 doses for nausea or vomiting. 20 tablet 0   pantoprazole (PROTONIX) 40 MG tablet Take 1 tablet (40 mg total) by mouth daily.     rosuvastatin (CRESTOR) 5 MG tablet Take 1 tablet (5 mg total) by mouth daily. 90 tablet 3   tirzepatide (MOUNJARO) 12.5 MG/0.5ML Pen Inject 12.5 mg into the skin once a week. 2 mL 0   valACYclovir (VALTREX) 500 MG tablet Take 1 tablet (500 mg total) by mouth daily. Increase to bid x 3 days with symptoms 90 tablet 3   Current Facility-Administered Medications  Medication Dose Route Frequency Provider Last Rate Last Admin   triamcinolone acetonide (KENALOG) 10 MG/ML injection 10 mg  10 mg Other Once Watts,  Orlie Pollen, DPM        PHYSICAL EXAMINATION: ECOG PERFORMANCE STATUS: 1 - Symptomatic but completely ambulatory  Vitals:   12/18/22 0843  BP: (!) 106/52  Pulse: 73  Resp: 18  Temp: 98 F (36.7 C)  SpO2: 100%   Filed Weights   12/18/22 0843  Weight: 157 lb 1.6 oz (71.3 kg)    Physical Exam   BREAST: No palpable breast tissue present, indicating post-mastectomy status. Presence of scar tissue with associated pulling sensation across the chest.      (exam performed in the presence of a chaperone)  LABORATORY DATA:  I have reviewed the data as listed    Latest Ref Rng & Units 12/18/2022    8:26 AM 11/09/2022   11:16 AM 07/22/2022    9:09 AM  CMP   Glucose 70 - 99 mg/dL 85  77  88   BUN 8 - 23 mg/dL 20  15  14    Creatinine 0.44 - 1.00 mg/dL 1.61  0.96  0.45   Sodium 135 - 145 mmol/L 139  140  138   Potassium 3.5 - 5.1 mmol/L 3.9  3.9  3.6   Chloride 98 - 111 mmol/L 106  106  112   CO2 22 - 32 mmol/L 28  27  21    Calcium 8.9 - 10.3 mg/dL 9.6  9.9  7.8   Total Protein 6.5 - 8.1 g/dL 6.9  7.0    Total Bilirubin <1.2 mg/dL 0.7  0.9    Alkaline Phos 38 - 126 U/L 75  63    AST 15 - 41 U/L 23  21    ALT 0 - 44 U/L 22  18      Lab Results  Component Value Date   WBC 5.3 12/18/2022   HGB 11.8 (L) 12/18/2022   HCT 35.5 (L) 12/18/2022   MCV 93.2 12/18/2022   PLT 212 12/18/2022   NEUTROABS 3.1 12/18/2022    ASSESSMENT & PLAN:  Malignant neoplasm of upper-outer quadrant of right breast in female, estrogen receptor negative (HCC) 08/27/2012: Right breast: Stage Ia IDC with DCIS T2 N0 M0 ER negative PR negative, HER-2 positive, Ki-67 20 to 25% status post neoadjuvant TCHP: Complete pathologic response, adjuvant radiation   05/10/2020: Recurrence/relapse: Right breast: 2.1 cm non-mass enhancement: Biopsy grade 2 invasive lobular cancer ER/PR positive HER-2 equivocal by IHC FISH pending, Ki-67 1% Left breast: 10.5 cm non-mass enhancement anterior biopsy fibrocystic change, posterior biopsy ALH/LCIS   Treatment plan: 1.   07/08/2020 Bilateral mastectomies with reconstruction: Right mastectomy: Grade 2 ILC 0.9 cm, ALH, margins negative, 0/1 lymph, left mastectomy: Benign 2. adjuvant antiestrogen therapy with anastrozole 1 mg daily x7 years started July 2022 Genetic testing: Neg ------------------------------------------------------------ Current treatment: Adjuvant antiestrogen therapy with anastrozole 1 mg daily x7 years started July 2022 Anastrozole Toxicities: Denies any major side effects to anastrozole therapy. Palpable lump in the right axilla: Ultrasound 03/24/2022: No sonographic evidence of malignancy.   CT angio chest 07/16/2022: No  PE, stable 3 mm right lung nodule Hospitalization 07/18/2022-07/22/2022: (Chest Pain leaning forward) Pericarditis and pericardial effusion status post pericardiocentesis: Cytology benign, A.Fib Anemia: During hospitalization.   Implant removal: 09/2022   Lab review 11/09/2022: Hemoglobin 12.3, B12 175 (gastric sleeve 10 years ago), ferritin 22, iron saturation 22%, CMP is normal   Recommendation:   B12 supplementation 5000 mcg sublingual daily ------------------------------------- Assessment and Plan    Breast Cancer Survivorship No current concerns. Patient is on Anastrozole. -Continue Anastrozole. -Plan to  use [employer], a circulating tumor DNA test, every six months for surveillance.  Pericarditis Patient had a severe case of pericarditis, now resolved. -No current plan as patient has been cleared.  Visual Changes Patient reports significant changes in vision, particularly with distance. Optometrist suggested it could be due to uncontrolled diabetes, but patient's A1C has been good. -Plan for patient to see an ophthalmologist for further evaluation.  Vitamin B12 Deficiency Patient is on sublingual B12 supplementation. -Continue B12 supplementation. -Check B12 levels today to ensure absorption and efficacy of treatment.  Breast Implant Complications Patient had one implant removed due to pain and complications. -No current plan as patient has been cleared from breast surgery.  General Health Maintenance -Order labs for next year's appointment. -Schedule follow-up appointment in one year.          Orders Placed This Encounter  Procedures   CBC with Differential (Cancer Center Only)    Standing Status:   Future    Standing Expiration Date:   12/18/2023   Ferritin    Standing Status:   Future    Standing Expiration Date:   12/18/2023   Iron and Iron Binding Capacity ([facility] only)    Standing Status:   Future    Standing Expiration Date:   12/18/2023   Vitamin  B12    Standing Status:   Future    Standing Expiration Date:   12/18/2023   The patient has a good understanding of the overall plan. she agrees with it. she will call with any problems that may develop before the next visit here. Total time spent: 30 mins including face to face time and time spent for planning, charting and co-ordination of care   Tamsen Meek, MD 12/18/22

## 2022-12-18 NOTE — Assessment & Plan Note (Signed)
08/27/2012: Right breast: Stage Ia IDC with DCIS T2 N0 M0 ER negative PR negative, HER-2 positive, Ki-67 20 to 25% status post neoadjuvant TCHP: Complete pathologic response, adjuvant radiation   05/10/2020: Recurrence/relapse: Right breast: 2.1 cm non-mass enhancement: Biopsy grade 2 invasive lobular cancer ER/PR positive HER-2 equivocal by IHC FISH pending, Ki-67 1% Left breast: 10.5 cm non-mass enhancement anterior biopsy fibrocystic change, posterior biopsy ALH/LCIS   Treatment plan: 1.   07/08/2020 Bilateral mastectomies with reconstruction: Right mastectomy: Grade 2 ILC 0.9 cm, ALH, margins negative, 0/1 lymph, left mastectomy: Benign 2. adjuvant antiestrogen therapy with anastrozole 1 mg daily x7 years started July 2022 Genetic testing: Neg ------------------------------------------------------------ Current treatment: Adjuvant antiestrogen therapy with anastrozole 1 mg daily x7 years started July 2022 Anastrozole Toxicities: Denies any major side effects to anastrozole therapy. Palpable lump in the right axilla: Ultrasound 03/24/2022: No sonographic evidence of malignancy.   CT angio chest 07/16/2022: No PE, stable 3 mm right lung nodule Hospitalization 07/18/2022-07/22/2022: (Chest Pain leaning forward) Pericarditis and pericardial effusion status post pericardiocentesis: Cytology benign, A.Fib Anemia: During hospitalization.   Implant removal: 09/2022   Lab review 11/09/2022: Hemoglobin 12.3, B12 175 (gastric sleeve 10 years ago), ferritin 22, iron saturation 22%, CMP is normal   Recommendation:  Patient will receive 2 inj of B12 before her vacation and then start oral B 12 after that B12 supplementation 5000 mcg sublingual daily

## 2022-12-18 NOTE — Telephone Encounter (Signed)
 Per md orders entered for [employer] and all supported documents faxed to [phone]. Faxed confirmation was received.

## 2023-01-04 ENCOUNTER — Ambulatory Visit: Payer: 59 | Admitting: Physician Assistant

## 2023-01-07 ENCOUNTER — Ambulatory Visit: Payer: 59 | Admitting: Physician Assistant

## 2023-01-14 ENCOUNTER — Ambulatory Visit: Payer: 59 | Admitting: Physician Assistant

## 2023-01-21 ENCOUNTER — Ambulatory Visit: Payer: 59 | Admitting: Podiatry

## 2023-01-21 ENCOUNTER — Ambulatory Visit: Payer: 59 | Admitting: Physician Assistant

## 2023-02-01 ENCOUNTER — Ambulatory Visit: Payer: 59 | Admitting: Physician Assistant

## 2023-02-01 ENCOUNTER — Other Ambulatory Visit (INDEPENDENT_AMBULATORY_CARE_PROVIDER_SITE_OTHER): Payer: 59

## 2023-02-01 ENCOUNTER — Encounter: Payer: Self-pay | Admitting: Physician Assistant

## 2023-02-01 ENCOUNTER — Other Ambulatory Visit: Payer: Self-pay | Admitting: Hematology and Oncology

## 2023-02-01 DIAGNOSIS — Z96651 Presence of right artificial knee joint: Secondary | ICD-10-CM

## 2023-02-01 MED ORDER — GABAPENTIN 600 MG PO TABS: 600.0000 mg | ORAL_TABLET | Freq: Three times a day (TID) | ORAL | 2 refills | Status: DC

## 2023-02-01 NOTE — Progress Notes (Signed)
Office Visit Note   Patient: Sierra Perez           Date of Birth: 07-Jun-1960           MRN: [id number] Visit Date: 02/01/2023              Requested by: Georgann Housekeeper, MD [address] [place],  [place] [address] PCP: Georgann Housekeeper, MD   Assessment & Plan: Visit Diagnoses:  1. Status post total right knee replacement     Plan: She will continue to work on range of motion strengthening.  Follow-up with [place] as needed.  She did ask about filling her gabapentin 1 more time this refill as she takes this for RSD.  From here out she will get this from her primary care physician.  Follow-Up Instructions: Return if symptoms worsen or fail to improve.   Orders:  Orders Placed This Encounter  Procedures   XR Knee 1-2 Views Right   Meds ordered this encounter  Medications   gabapentin (NEURONTIN) 600 MG tablet    Sig: Take 1 tablet (600 mg total) by mouth 3 (three) times daily.    Dispense:  90 tablet    Refill:  2    ZERO refills remain on this prescription. Your patient is requesting advance approval of refills for this medication to PREVENT ANY MISSED DOSES      Procedures: No procedures performed   Clinical Data: No additional findings.   Subjective: Chief Complaint  Patient presents with   Right Knee - Follow-up    01/09/22 RT TKA    HPI Sierra Perez returns today 1 year status post right total knee arthroplasty.  Right total knee arthroplasty was performed 01/09/2022.  She states knee is overall doing well.  She has not achiness particularly after working out.  She states that she really had some episodes where she could not exercise due to a heart condition and also with some health issues with her mother.  She notes just some achiness in her knees on certain days after working out.  Otherwise she is happy with the knee. Review of Systems See HPI otherwise negative  Objective: Vital Signs: There were no vitals taken for this visit.  Physical  Exam General: Well-developed well-nourished female no acute distress ambulates without any assistive device. Ortho Exam Right knee: Full extension flexion to 115 degrees.  Left knee she is able to flex to approximately 120 degrees.  Right knee no instability valgus varus stressing.  Surgical incisions well-healed. Specialty Comments:  No specialty comments available.  Imaging: XR Knee 1-2 Views Right Result Date: 02/01/2023 Right knee 2 views: Knee is well located.  No acute fractures acute findings.  Right total knee arthroplasty components well-seated.    PMFS History: Patient Active Problem List   Diagnosis Date Noted   Breast cancer (HCC) 09/21/2022   Acquired absence of breast and absent nipple 08/24/2022   New onset a-fib (HCC) 07/22/2022   Pericardial effusion 07/19/2022   ST elevation 07/18/2022   Pericarditis 07/18/2022   Nail dystrophy 05/05/2022   Status post total right knee replacement 01/09/2022   Iron deficiency anemia 01/23/2021   Status post right hip replacement 12/03/2020   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   H/O total hysterectomy 06/11/2020   Chronic superficial gastritis 05/27/2020   Fibromyalgia 05/27/2020   GERD (gastroesophageal reflux disease) 05/27/2020   Complex regional pain syndrome I, unspecified 05/27/2020   Atypical lobular hyperplasia [facility]) of left breast 05/23/2020  History of therapeutic radiation 05/23/2020   Family history of breast cancer    Family history of colon cancer    Unilateral primary osteoarthritis, right knee 02/26/2020   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   DVT (deep venous thrombosis) (HCC) 05/04/2018   Malignant neoplasm of upper-outer quadrant of right breast in female, estrogen receptor negative (HCC) 05/03/2018   Obstructive sleep apnea (adult) (pediatric) 09/14/2017   Adenopathy 07/15/2017   B12 deficiency 07/15/2017   Disorder of tympanic membrane of right ear 07/15/2017   Recurrent UTI 03/09/2017   Proteinuria  02/25/2017   At risk for obstructive sleep apnea 07/01/2016   Suspected sleep apnea 07/01/2016   Arthritis of left knee 05/26/2016   Localized primary osteoarthritis of lower leg, left 05/26/2016   Clotting disorder (HCC) 05/22/2016   Chronic anticoagulation 01/06/2016   Pelvic and perineal pain 11/28/2013   Anxiety and depression 11/16/2013   H/O bariatric surgery 11/16/2013   Atrophic vaginitis 11/13/2013   Tear of lateral cartilage or meniscus of knee, current 04/14/2013   Allergic rhinitis 01/16/2013   Symptomatic menopausal or female climacteric states 11/10/2012   Disequilibrium 06/09/2012   Vitamin D deficiency 09/13/2008   Preop examination 04/20/2008   Hypothyroidism 05/20/2007   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, her-2 positive, ER/PR negative   Clotting disorder (HCC)    testing was negative, h/o DVT while on treatment   Colon polyps    Complex regional pain syndrome i of right lower limb    RDS   Complication of anesthesia    Cystitis    Diabetes mellitus without complication (HCC)    DVT (deep venous thrombosis) (HCC)    started in 2015, multiple   Dysrhythmia    Family history of breast cancer    Family history of colon cancer    Fibroid    Fibromyalgia    GERD (gastroesophageal reflux disease)    Heart murmur    HLD (hyperlipidemia)    HPV in female    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   Hypertension    Hypothyroidism    IBS (irritable bowel syndrome)    Mitral valve prolapse    Peptic ulcer    Personal history of malignant neoplasm of breast    Sleep apnea treated with continuous positive airway pressure (CPAP)     Family History  Problem Relation Age of Onset   Hypertension Mother    Skin cancer Mother    Colon polyps Mother    Valvular heart disease Mother    Kidney disease Mother    Diabetes Father    Kidney disease  Father    Dementia Father    Factor VIII deficiency Father    Colon polyps Brother    Bladder Cancer Maternal Grandmother        dx in late 73s; smoker   Stroke Maternal Grandmother    Diabetes Maternal Grandmother    Lung cancer Maternal Grandfather    Colon cancer Maternal Grandfather 62       origin   Thyroid cancer Maternal Grandfather        dx in his 69s   Breast cancer Paternal Grandmother        dx in 82s   Prostate cancer Other        MGMs brother    Past Surgical  History:  Procedure Laterality Date   ABDOMINAL HYSTERECTOMY  2010   fibroids, h/o abnormal pap smears   AXILLARY SENTINEL NODE BIOPSY Right 07/08/2020   Procedure: RIGHT AXILLARY SENTINEL NODE BIOPSY;  Surgeon: Emelia Loron, MD;  Location: [facility] OR;  Service: General;  Laterality: Right;   BREAST IMPLANT REMOVAL Right 09/21/2022   Procedure: REMOVAL BREAST IMPLANTS;  Surgeon: Peggye Form, DO;  Location: [facility];  Service: Plastics;  Laterality: Right;   BREAST RECONSTRUCTION WITH PLACEMENT OF TISSUE EXPANDER AND FLEX HD (ACELLULAR HYDRATED DERMIS) Bilateral 07/08/2020   Procedure: BILATERAL BREAST RECONSTRUCTION WITH  FLEX HD (ACELLULAR HYDRATED DERMIS),DIRECT IMPLANT RECONSTRUCTION;  Surgeon: Allena Napoleon, MD;  Location: [facility] OR;  Service: Plastics;  Laterality: Bilateral;   BREAST SURGERY Right 2015   right lumpectomy with reduction and lift   COLONOSCOPY     DENTAL SURGERY     ESOPHAGOGASTRODUODENOSCOPY     LAPAROSCOPIC GASTRIC SLEEVE RESECTION  2014   LAPAROSCOPIC OOPHERECTOMY Bilateral 2016   LEFT HEART CATH AND CORONARY ANGIOGRAPHY N/A 07/18/2022   Procedure: LEFT HEART CATH AND CORONARY ANGIOGRAPHY;  Surgeon: [name], MD;  Location: [facility] INVASIVE CV LAB;  Service: Cardiovascular;  Laterality: N/A;   PERICARDIOCENTESIS N/A 07/20/2022   Procedure: PERICARDIOCENTESIS;  Surgeon: [name], MD;  Location: [facility] INVASIVE CV LAB;  Service: Cardiovascular;  Laterality:  N/A;   REPLACEMENT TOTAL KNEE Left 2018   also had 3 prior arthroscopies   TOE SURGERY Right    dislocated toe   TOTAL HIP ARTHROPLASTY Right 11/19/2020   Procedure: RIGHT TOTAL HIP ARTHROPLASTY ANTERIOR APPROACH;  Surgeon: Kathryne Hitch, MD;  Location: [facility] OR;  Service: Orthopedics;  Laterality: Right;   TOTAL KNEE ARTHROPLASTY Right 01/09/2022   Procedure: RIGHT TOTAL KNEE ARTHROPLASTY;  Surgeon: Kathryne Hitch, MD;  Location: [facility] ORS;  Service: Orthopedics;  Laterality: Right;   TOTAL MASTECTOMY Bilateral 07/08/2020   Procedure: BILATERAL TOTAL MASTECTOMY;  Surgeon: Emelia Loron, MD;  Location: [facility] OR;  Service: General;  Laterality: Bilateral;   VENA CAVA FILTER PLACEMENT  2017   Social History   Occupational History   Occupation: [employer]  Tobacco Use   Smoking status: Never   Smokeless tobacco: Never  Vaping Use   Vaping status: Never Used  Substance and Sexual Activity   Alcohol use: Yes    Comment: rare   Drug use: Not Currently    Types: Marijuana   Sexual activity: Not Currently    Birth control/protection: Surgical    Comment: Hysterectomy

## 2023-02-05 ENCOUNTER — Telehealth: Payer: Self-pay | Admitting: *Deleted

## 2023-02-05 NOTE — Telephone Encounter (Signed)
 Received call from pt stating she received a call from [employer] team stating she would be responsible for full payment of test due to lapse in insurance coverage at this time.  RN placed call to contact with [employer] who states she will look into this and update our staff on Monday.

## 2023-02-09 ENCOUNTER — Telehealth: Payer: Self-pay | Admitting: *Deleted

## 2023-02-09 ENCOUNTER — Encounter: Payer: Self-pay | Admitting: *Deleted

## 2023-02-09 NOTE — Telephone Encounter (Signed)
 Received call from pt regarding recent b12 lab showing a result of 2,256 while on 5,000 mcg daily of sublingual vitamin b12.  Per MD pt needing to decrease dose to 2,500 mcg daily and recheck at next appt.  Pt educated and verbalized understanding.

## 2023-02-09 NOTE — Progress Notes (Signed)
 RN faxed [employer] form for full coverage of [employer] Reveal test to be covered since pt is not currently insured.

## 2023-02-23 ENCOUNTER — Encounter: Payer: Self-pay | Admitting: Hematology and Oncology

## 2023-03-01 ENCOUNTER — Encounter: Payer: Self-pay | Admitting: Hematology and Oncology

## 2023-03-08 ENCOUNTER — Encounter: Payer: Self-pay | Admitting: Hematology and Oncology

## 2023-03-15 ENCOUNTER — Other Ambulatory Visit: Payer: Self-pay | Admitting: Hematology and Oncology

## 2023-03-15 ENCOUNTER — Ambulatory Visit: Payer: Self-pay | Attending: Hematology and Oncology

## 2023-03-15 VITALS — Wt 160.4 lb

## 2023-03-15 DIAGNOSIS — Z483 Aftercare following surgery for neoplasm: Secondary | ICD-10-CM

## 2023-03-15 NOTE — Therapy (Signed)
 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, her-2 positive, ER/PR negative   Clotting disorder (HCC)    testing was negative, h/o DVT while on treatment   Colon polyps    Complex regional pain syndrome i of right lower limb    RDS   Complication of anesthesia    Cystitis    Diabetes mellitus without complication (HCC)    DVT (deep venous thrombosis) (HCC)    started in 2015, multiple   Dysrhythmia    Family history of breast cancer    Family history of colon cancer    Fibroid    Fibromyalgia    GERD (gastroesophageal reflux disease)    Heart murmur    HLD (hyperlipidemia)    HPV in female    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   Hypertension    Hypothyroidism    IBS (irritable bowel syndrome)    Mitral valve prolapse    Peptic ulcer    Personal history of malignant neoplasm of breast    Sleep apnea treated with continuous positive airway pressure (CPAP)    Past Surgical History:  Procedure Laterality Date   ABDOMINAL HYSTERECTOMY  2010   fibroids, h/o abnormal pap smears   AXILLARY SENTINEL NODE BIOPSY Right 07/08/2020   Procedure: RIGHT AXILLARY SENTINEL NODE BIOPSY;  Surgeon: Enid Harry, MD;  Location: [facility] OR;  Service: General;  Laterality: Right;   BREAST IMPLANT REMOVAL Right 09/21/2022   Procedure: REMOVAL BREAST IMPLANTS;  Surgeon:  Thornell Flirt, DO;  Location: [facility];  Service: Plastics;  Laterality: Right;   BREAST RECONSTRUCTION WITH PLACEMENT OF TISSUE EXPANDER AND FLEX HD (ACELLULAR HYDRATED DERMIS) Bilateral 07/08/2020   Procedure: BILATERAL BREAST RECONSTRUCTION WITH  FLEX HD (ACELLULAR HYDRATED DERMIS),DIRECT IMPLANT RECONSTRUCTION;  Surgeon: Barb Bonito, MD;  Location: [facility] OR;  Service: Plastics;  Laterality: Bilateral;   BREAST SURGERY Right 2015   right lumpectomy with reduction and lift   COLONOSCOPY     DENTAL SURGERY     ESOPHAGOGASTRODUODENOSCOPY     LAPAROSCOPIC GASTRIC SLEEVE RESECTION  2014   LAPAROSCOPIC OOPHERECTOMY Bilateral 2016   LEFT HEART CATH AND CORONARY ANGIOGRAPHY N/A 07/18/2022   Procedure: LEFT HEART CATH AND CORONARY ANGIOGRAPHY;  Surgeon: Arleen Lacer, MD;  Location: [facility] INVASIVE CV LAB;  Service: Cardiovascular;  Laterality: N/A;   PERICARDIOCENTESIS N/A 07/20/2022   Procedure: PERICARDIOCENTESIS;  Surgeon: Arleen Lacer, MD;  Location: [facility] INVASIVE CV LAB;  Service: Cardiovascular;  Laterality: N/A;   REPLACEMENT TOTAL KNEE Left 2018   also had 3 prior arthroscopies   TOE SURGERY Right    dislocated toe   TOTAL HIP ARTHROPLASTY Right 11/19/2020   Procedure: RIGHT  TOTAL HIP ARTHROPLASTY ANTERIOR APPROACH;  Surgeon: Arnie Lao, MD;  Location: [facility] OR;  Service: Orthopedics;  Laterality: Right;   TOTAL KNEE ARTHROPLASTY Right 01/09/2022   Procedure: RIGHT TOTAL KNEE ARTHROPLASTY;  Surgeon: Arnie Lao, MD;  Location: [facility] ORS;  Service: Orthopedics;  Laterality: Right;   TOTAL MASTECTOMY Bilateral 07/08/2020   Procedure: BILATERAL TOTAL MASTECTOMY;  Surgeon: Enid Harry, MD;  Location: [facility] OR;  Service: General;  Laterality: Bilateral;   VENA CAVA FILTER PLACEMENT  2017   Patient Active Problem List   Diagnosis Date Noted   Breast cancer (HCC) 09/21/2022   Acquired absence of breast and absent nipple 08/24/2022   New onset  a-fib (HCC) 07/22/2022   Pericardial effusion 07/19/2022   ST elevation 07/18/2022   Pericarditis 07/18/2022   Nail dystrophy 05/05/2022   Status post total right knee replacement 01/09/2022   Iron  deficiency anemia 01/23/2021   Status post right hip replacement 12/03/2020   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   H/O total hysterectomy 06/11/2020   Chronic superficial gastritis 05/27/2020   Fibromyalgia 05/27/2020   GERD (gastroesophageal reflux disease) 05/27/2020   Complex regional pain syndrome I, unspecified 05/27/2020   Atypical lobular hyperplasia [facility]) of left breast 05/23/2020   History of therapeutic radiation 05/23/2020   Family history of breast cancer    Family history of colon cancer    Unilateral primary osteoarthritis, right knee 02/26/2020   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   DVT (deep venous thrombosis) (HCC) 05/04/2018   Malignant neoplasm of upper-outer quadrant of right breast in female, estrogen receptor negative (HCC) 05/03/2018   Obstructive sleep apnea (adult) (pediatric) 09/14/2017   Adenopathy 07/15/2017   B12 deficiency 07/15/2017   Disorder of tympanic membrane of right ear 07/15/2017   Recurrent UTI 03/09/2017   Proteinuria 02/25/2017   At risk for obstructive sleep apnea 07/01/2016   Suspected sleep apnea 07/01/2016   Arthritis of left knee 05/26/2016   Localized primary osteoarthritis of lower leg, left 05/26/2016   Clotting disorder (HCC) 05/22/2016   Chronic anticoagulation 01/06/2016   Pelvic and perineal pain 11/28/2013   Anxiety and depression 11/16/2013   H/O bariatric surgery 11/16/2013   Atrophic vaginitis 11/13/2013   Tear of lateral cartilage or meniscus of knee, current 04/14/2013   Allergic rhinitis 01/16/2013   Symptomatic menopausal or female climacteric states 11/10/2012   Disequilibrium 06/09/2012   Vitamin D  deficiency 09/13/2008   Preop examination 04/20/2008   Hypothyroidism 05/20/2007    REFERRING DIAG: right breast cancer at  risk for lymphedema  THERAPY DIAG: Aftercare following surgery for neoplasm  PERTINENT HISTORY: 2014 Rt breast cancer with lumpectomy with SLNB, radiation, and chemotherapy. Recurrence on the Rt with bil mastectomy with SLNB on 07/08/20 due to grade 2 ILC ER/PR positive.  Other history includes clotting disorder and DVT history. Had a SOZO score increased of 14.3 on 03/13/21 which decreased back to normal in 04/10/21.  Pt with hx of breast cancer s/p reconstruction recently underwent R breast implant removal 09/21/22 and now experiencing tightening R chest wall and limited ROM of RUE. Has hx of pericarditis with pericardial effusion. 01/2022 R TKA   PRECAUTIONS: right UE Lymphedema risk, None  SUBJECTIVE: Pt returns for her 3 month L-Dex screen.   PAIN:  Are you having pain? No  SOZO SCREENING: Patient was assessed today using the SOZO machine to determine the lymphedema index score. This was compared to her baseline score. It was determined that she is within the recommended range when compared  to her baseline and no further action is needed at this time. She will continue SOZO screenings. These are done every 3 months for 2 years post operatively followed by every 6 months for 2 years, and then annually.   L-DEX FLOWSHEETS - 03/15/23 1000       L-DEX LYMPHEDEMA SCREENING   Measurement Type Unilateral    L-DEX MEASUREMENT EXTREMITY Upper Extremity    POSITION  Standing    DOMINANT SIDE Left    At Risk Side Right    BASELINE SCORE (UNILATERAL) 5.9    L-DEX SCORE (UNILATERAL) 0    VALUE CHANGE (UNILAT) -5.9            P: Begin 6 month L-Dex screens next.    Denyce Flank, PTA 03/15/2023, 10:39 AM

## 2023-03-19 ENCOUNTER — Telehealth: Payer: Self-pay | Admitting: Cardiology

## 2023-03-19 MED ORDER — MOUNJARO 7.5 MG/0.5ML ~~LOC~~ SOAJ: 7.5000 mg | SUBCUTANEOUS | 0 refills | Status: DC

## 2023-03-19 NOTE — Telephone Encounter (Signed)
Spoke to pt, states she had 2 cataract surgery back to back and was off of Mounjaro for over a month. Wanted to restart the therapy. Last dose was 12.5 mg once a week will reduce it to 7.5 mg once a week to improve tolerability and titrate back to 10 mg in 4 weeks.   Patient states her pharmacy has Mounjaro 10 mg prescription from PCP so no need to call in the new prescription in 4 weeks. Mounjaro 7.5 mg one month supply sent to the pharmacy as per pt's request.

## 2023-03-19 NOTE — Telephone Encounter (Signed)
Pt c/o medication issue:  1. Name of Medication:   tirzepatide (MOUNJARO) 12.5 MG/0.5ML Pen   2. How are you currently taking this medication (dosage and times per day)?   3. Are you having a reaction (difficulty breathing--STAT)?   4. What is your medication issue?   Patient stated she has been off this medication for a month and wants advice on next steps.

## 2023-03-22 ENCOUNTER — Telehealth: Payer: Self-pay | Admitting: Plastic Surgery

## 2023-03-22 NOTE — Telephone Encounter (Signed)
Patient wants to know what the test is for a silicone implant after 72yrs, please reach out advise

## 2023-04-21 ENCOUNTER — Other Ambulatory Visit: Payer: Self-pay | Admitting: Internal Medicine

## 2023-05-03 ENCOUNTER — Ambulatory Visit: Payer: Self-pay | Attending: Cardiology | Admitting: Cardiology

## 2023-05-03 ENCOUNTER — Encounter: Payer: Self-pay | Admitting: Cardiology

## 2023-05-03 ENCOUNTER — Encounter: Payer: Self-pay | Admitting: Hematology and Oncology

## 2023-05-03 VITALS — BP 96/68 | HR 67 | Ht 68.0 in | Wt 152.2 lb

## 2023-05-03 DIAGNOSIS — I3139 Other pericardial effusion (noninflammatory): Secondary | ICD-10-CM

## 2023-05-03 DIAGNOSIS — E1169 Type 2 diabetes mellitus with other specified complication: Secondary | ICD-10-CM

## 2023-05-03 DIAGNOSIS — I10 Essential (primary) hypertension: Secondary | ICD-10-CM

## 2023-05-03 DIAGNOSIS — E785 Hyperlipidemia, unspecified: Secondary | ICD-10-CM

## 2023-05-03 DIAGNOSIS — I82599 Chronic embolism and thrombosis of other specified deep vein of unspecified lower extremity: Secondary | ICD-10-CM

## 2023-05-03 DIAGNOSIS — K219 Gastro-esophageal reflux disease without esophagitis: Secondary | ICD-10-CM

## 2023-05-03 DIAGNOSIS — I480 Paroxysmal atrial fibrillation: Secondary | ICD-10-CM | POA: Diagnosis not present

## 2023-05-03 DIAGNOSIS — I300 Acute nonspecific idiopathic pericarditis: Secondary | ICD-10-CM | POA: Diagnosis not present

## 2023-05-03 DIAGNOSIS — G4733 Obstructive sleep apnea (adult) (pediatric): Secondary | ICD-10-CM

## 2023-05-03 NOTE — Patient Instructions (Addendum)
 Medication Instructions:   You can try and wean off  the Protonix   for  couple weeks every other day then stop to see if Symptoms return   *If you need a refill on your cardiac medications before your next appointment, please call your pharmacy*   Lab Work:  Not needed   Testing/Procedures: Not needed   Follow-Up: At [facility], you and your health needs are our priority.  As part of our continuing mission to provide you with exceptional heart care, we have created designated Provider Care Teams.  These Care Teams include your primary Cardiologist (physician) and Advanced Practice Providers (APPs -  Physician Assistants and Nurse Practitioners) who all work together to provide you with the care you need, when you need it.     Your next appointment:   12 month(s)  The format for your next appointment:   In Person  Provider:   Bryan Lemma, MD    Other Instructions  s

## 2023-05-03 NOTE — Progress Notes (Unsigned)
 Cardiology Office Note:  .   Date:  05/05/2023  ID:  Sierra Perez, DOB September 06, 1960, MRN [id number] PCP: Georgann Housekeeper, MD  [facility] Providers Cardiologist:  Bryan Lemma, MD     Chief Complaint  Patient presents with   Follow-up    53-month follow-up.  Doing well   Atrial Fibrillation    No breakthrough since pericarditis   Patient Profile: .     Sierra Perez is a formerly obese 63 y.o. female with a PMH reviewed below who presents here for 81-month follow-up at the request of Georgann Housekeeper, MD.  PMH: History of pericardial effusion with pericarditis => pericardiocentesis for an mL, treated with ibuprofen and colchicine Resolved pericardial effusion with follow-up echo PAF-developed A-fib RVR July 23, 2022-converted with IV amiodarone and converted to oral. 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  Amiodarone has been discontinued.  No recurrent symptoms.  3 months of colchicine completed.  PPI reduced to once daily with discontinuation of NSAID.  She underwent removal of right breast implant 09/21/2022.  Plan with 64-month follow-up  Subjective  Discussed the use of AI scribe software for clinical note transcription with the patient, who gave verbal consent to proceed.  History of Present Illness   Sierra Perez is a 63 year old female with h/o Pericarditis complicated by bloody pericardial effusion who presents for ~ 6 month f/u.  No breakthrough spells, rapid heart rate, or severe heartbeats since the last visit. Blood pressure medications were discontinued during her hospital stay.  She is currently on Eliquis due to her history of atrial fibrillation and gastrointestinal bleeding on aspirin. She recalls a previous episode of pericarditis, likely triggered by a viral infection, which led to a bloody pericardial effusion. She completed a three-month course of colchicine, which she  tolerated well after a dose reduction.  She has been on [facility] for about a year to a year and a half, primarily for diabetes management. Reducing the dose previously led to increased cravings and poor eating habits. She is concerned about insurance coverage for [facility] as she transitions to a new Financial controller. Her A1c was 4.9 in November, indicating well-controlled diabetes.  She is also on rosuvastatin for cholesterol management due to her diabetes, despite having good cholesterol levels and clear coronary arteries.  She has been taking pantoprazole for GERD, which was exacerbated by previous NSAID use. She wants to wean off this medication, recalling past issues with Nexium leading to stomach polyps.  She is experiencing her first sinus infection since last year and is on her second antibiotic course. She feels slightly better but not fully recovered after two weeks of illness.        Objective   Family History - Father was on dialysis - Mother has kidney problems  CV medications: Eliquis 5 mg twice daily, rosuvastatin 5 mg daily, Mounjaro 7.5 mg daily. -Albuterol inhaler every 6 hours as needed; Flonase 2 sprays per nostril daily; Atrovent 2 sprays both nostrils 2 times daily - Arimidex 1 mg daily;  - Wellbutrin 150 mg daily; Cymbalta 60 mg daily; Neurontin 600 mg 3 times daily -Synthroid 50 mg daily -Zofran 4 mg every 8 hours as needed; Protonix 40 mg daily; probiotic 2050 mg daily  Studies Reviewed: [name]   EKG Interpretation Date/Time:  Monday May 03 2023 11:19:31 EDT Ventricular Rate:  67 PR Interval:  140  QRS Duration:  100 QT Interval:  386 QTC Calculation: 407 R Axis:   22  Text Interpretation: Normal sinus rhythm Possible Left atrial enlargement Incomplete right bundle branch block When compared with ECG of 31-Jul-2022 09:03, Nonspecific T wave abnormality no longer evident in Inferior leads Nonspecific T wave abnormality, improved in Anterolateral leads Confirmed by  Bryan Lemma ([id number]) on 05/03/2023 11:26:18 AM    Lab Results  Component Value Date   NA 139 12/18/2022   K 3.9 12/18/2022   CREATININE 0.97 12/18/2022   GFRNONAA >60 12/18/2022   GLUCOSE 85 12/18/2022   Lab Results  Component Value Date   CHOL 110 07/18/2022   HDL 63 07/18/2022   LDLCALC 37 07/18/2022   TRIG 50 07/18/2022   CHOLHDL 1.7 07/18/2022   Lab Results  Component Value Date   WBC 5.3 12/18/2022   HGB 11.8 (L) 12/18/2022   HCT 35.5 (L) 12/18/2022   MCV 93.2 12/18/2022   PLT 212 12/18/2022    ECHO February 2023: EF 55 to 60%.  No RWMA. Coronary CTA January 2023: CAC 26 and LAD.  Mild plaque (mid LAD 25 to 49% focal calcification).  Cardiac Cath July 18, 2022: Normal coronaries (performed because of ST elevations on EKG and chest pain) Echocardiogram July 19, 2022: EF 60 to 65% with no RWMA.  Large pericardial effusion with possible tamponade, dilated IVC with RAP estimated 15 mmHg.  =>  pericardiocentesis on 07/20/2022 (400 mL dark placed fluid.  (Eliquis held for 1 month) => preprocedure large echolucent pericardial effusion with signs of early tamponade, post pericardiocentesis, complete drainage of pericardial fluid. Follow-up Echo 09/07/2022: EF 65 to 70%.  Resolved pericardial effusion. 30-day monitor August 2024: Normal sinus rhythm with no signs of A-fib.  No arrhythmias noted only sinus rhythm and sinus tachycardia.  Heart rate range was 57 to 130 bpm   Risk Assessment/Calculations:    CHA2DS2-VASc Score = 3   This indicates a 3.2% annual risk of stroke. The patient's score is based upon: CHF History: 0 HTN History: 1 Diabetes History: 1 Stroke History: 0 Vascular Disease History: 0 Age Score: 0 Gender Score: 1   On standing dose of DOAC for chronic DVT      Physical Exam:   VS:  BP 96/68 (BP Location: Right Arm, Patient Position: Sitting, Cuff Size: Normal)   Pulse 67   Ht 5\' 8"  (1.727 m)   Wt 152 lb 3.2 oz (69 kg)   SpO2 99%   BMI 23.14 kg/m     Wt Readings from Last 3 Encounters:  05/03/23 152 lb 3.2 oz (69 kg)  03/15/23 160 lb 6 oz (72.7 kg)  12/18/22 157 lb 1.6 oz (71.3 kg)    GEN: Well nourished, well developed in no acute distress; healthy-appearing.  Well-groomed. NECK: No JVD; No carotid bruits CARDIAC: Normal S1, S2; RRR, no murmurs, rubs, gallops RESPIRATORY:  Clear to auscultation without rales, wheezing or rhonchi ; nonlabored, good air movement. ABDOMEN: Soft, non-tender, non-distended EXTREMITIES:  No edema; No deformity     ASSESSMENT AND PLAN: .    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 Cholesterol levels well-controlled -> LDL 37 on last check Coronary arteries clear, but statin therapy recommended for cardiovascular protection due to diabetes.  Decision to continue rosuvastatin based on diabetes management guidelines to prevent future cardiovascular events. - Continue rosuvastatin 5 mg daily for cardiovascular protection.  Type 2 diabetes mellitus managed with Mounjaro. Discussed potential lifelong maintenance therapy with Mounjaro due to weight fluctuations and associated health issues. Addressed insurance coverage concerns, emphasizing Mounjaro for diabetes management. Plan includes potential dose reduction to find maintenance level preventing weight gain and maintaining healthy eating habits. - Continue Mounjaro for diabetes management. - Monitor insurance coverage for [facility] as a diabetes medication.      PAF (paroxysmal atrial fibrillation) (HCC) (Chronic)   Atrial fibrillation in the setting of pericarditis.  Likely would  not recur. He is already on Eliquis for recurrent DVT.  - BP and heart rate are stable without AV nodal agents.  In fact somewhat hypotensive.  Will try to avoid treatment unless necessary.  -Monitor for recurrence - Continue Eliquis for anticoagulation.      Pericardial effusion   Pericarditis with bloody pericardial effusion likely secondary to a viral infection and exacerbated by anticoagulation with Eliquis.  => 400 mL removed with pericardiocentesis that was stable and notably improved symptomatology.  No recurrence and follow-up echocardiogram.  Continue to monitor.      Pericarditis - Primary   Likely reactive to what sounded like a viral infection predating her admission.  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  Next day she was found to have pericardial effusion that was drained and found to be bloody related to likely hemorrhagic pericarditis.  Completed colchicine course, reducing recurrence risk. Currently asymptomatic with no recurrence. Discussed recurrence potential and new medications for recurrent pericarditis. Explained inflammatory process and treatment rationale with colchicine and NSAIDs.  Discussed effusion drainage procedure, including risks such as arrhythmias and need for careful monitoring. Ninety-five percent success rate without complications. - Monitor for pericarditis symptoms such as chest pain, dyspnea, or edema. - Echocardiogram only if symptoms recur. -> If she were to have recurrent pericarditis, would again treat with NSAIDs and colchicine but would look into novel therapies for recurrent pericarditis        Other   GERD (gastroesophageal reflux disease) (Chronic)   GERD previously exacerbated by NSAID use and managed with pantoprazole. Discussed risks of long-term PPI use, including  potential for stomach polyps. Currently asymptomatic and interested in discontinuing pantoprazole. Plan to wean off medication gradually to avoid rebound symptoms. - Wean off pantoprazole by taking it every other day before discontinuing.      Obstructive sleep apnea (adult) (pediatric)   Less like an issue given significant weight loss.       Follow-up No current symptoms of pericarditis or acute cardiac issues. Regular follow-up necessary to monitor for recurrence or new symptoms. Transition to new office location for future appointments. - Schedule follow-up appointment in one year: Return in about 1 year (around 05/02/2024) for Routine follow up with me, [employer].  Total time spent: 23 min spent with patient + 21 min spent charting = 44 min I spent 44 minutes in the care of Jayna Emmick today including reviewing labs (1 min), reviewing studies (5 min), face to face time discussing treatment  options (23), reviewing records from hospitalization and several APP follow-up visits.  (6 minutes), 9 minutes dictating, and documenting in the encounter.    Signed, [name], MD, MS Bryan Lemma, M.D., M.S. Interventional Cardiologist  [facility]  Pager # [phone] Phone # [phone] [address] [place], [place] [address]

## 2023-05-05 ENCOUNTER — Encounter: Payer: Self-pay | Admitting: Cardiology

## 2023-05-05 DIAGNOSIS — E1169 Type 2 diabetes mellitus with other specified complication: Secondary | ICD-10-CM | POA: Insufficient documentation

## 2023-05-05 DIAGNOSIS — I10 Essential (primary) hypertension: Secondary | ICD-10-CM | POA: Insufficient documentation

## 2023-05-05 NOTE — Assessment & Plan Note (Signed)
 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

## 2023-05-05 NOTE — Assessment & Plan Note (Signed)
 Pericarditis with bloody pericardial effusion likely secondary to a viral infection and exacerbated by anticoagulation with Eliquis.  => 400 mL removed with pericardiocentesis that was stable and notably improved symptomatology.  No recurrence and follow-up echocardiogram.  Continue to monitor.

## 2023-05-05 NOTE — Assessment & Plan Note (Signed)
 GERD previously exacerbated by NSAID use and managed with pantoprazole. Discussed risks of long-term PPI use, including potential for stomach polyps. Currently asymptomatic and interested in discontinuing pantoprazole. Plan to wean off medication gradually to avoid rebound symptoms. - Wean off pantoprazole by taking it every other day before discontinuing.

## 2023-05-05 NOTE — Assessment & Plan Note (Addendum)
 Hyperlipidemia managed with rosuvastatin. Cholesterol levels well-controlled -> LDL 37 on last check Coronary arteries clear, but statin therapy recommended for cardiovascular protection due to diabetes.  Decision to continue rosuvastatin based on diabetes management guidelines to prevent future cardiovascular events. - Continue rosuvastatin 5 mg daily for cardiovascular protection.  Type 2 diabetes mellitus managed with Mounjaro. Discussed potential lifelong maintenance therapy with Mounjaro due to weight fluctuations and associated health issues. Addressed insurance coverage concerns, emphasizing Mounjaro for diabetes management. Plan includes potential dose reduction to find maintenance level preventing weight gain and maintaining healthy eating habits. - Continue Mounjaro for diabetes management. - Monitor insurance coverage for [facility] as a diabetes medication.

## 2023-05-05 NOTE — Assessment & Plan Note (Addendum)
 Likely reactive to what sounded like a viral infection predating her admission.  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  Next day she was found to have pericardial effusion that was drained and found to be bloody related to likely hemorrhagic pericarditis.  Completed colchicine course, reducing recurrence risk. Currently asymptomatic with no recurrence. Discussed recurrence potential and new medications for recurrent pericarditis. Explained inflammatory process and treatment rationale with colchicine and NSAIDs.  Discussed effusion drainage procedure, including risks such as arrhythmias and need for careful monitoring. Ninety-five percent success rate without complications. - Monitor for pericarditis symptoms such as chest pain, dyspnea, or edema. - Echocardiogram only if symptoms recur. -> If she were to have recurrent pericarditis, would again treat with NSAIDs and colchicine but would look into novel therapies for recurrent pericarditis

## 2023-05-05 NOTE — Assessment & Plan Note (Signed)
 Less like an issue given significant weight loss.

## 2023-05-05 NOTE — Assessment & Plan Note (Signed)
 Atrial fibrillation in the setting of pericarditis.  Likely would not recur. He is already on Eliquis for recurrent DVT.  - BP and heart rate are stable without AV nodal agents.  In fact somewhat hypotensive.  Will try to avoid treatment unless necessary.  -Monitor for recurrence - Continue Eliquis for anticoagulation.

## 2023-05-11 ENCOUNTER — Ambulatory Visit: Payer: Self-pay | Admitting: Plastic Surgery

## 2023-05-11 ENCOUNTER — Encounter: Payer: Self-pay | Admitting: Plastic Surgery

## 2023-05-11 VITALS — BP 127/84 | HR 71 | Ht 68.0 in | Wt 149.8 lb

## 2023-05-11 DIAGNOSIS — Z9013 Acquired absence of bilateral breasts and nipples: Secondary | ICD-10-CM

## 2023-05-11 DIAGNOSIS — N644 Mastodynia: Secondary | ICD-10-CM

## 2023-05-11 NOTE — Progress Notes (Addendum)
   Subjective:    Patient ID: Sierra Perez, female    DOB: September 23, 1960, 63 y.o.   MRN: [id number]  The patient is a 63 yrs old female here for follow up on her right breast.  She underwent right breast surgery 10 years ago and came to see me for revision.  She sustained a bruise to the right breast and decided to have the implant removed.  She was taken to the OR August 2024 and had a right implant removal. She has a small seroma in the right breast but no sign of redness or infection.       Review of Systems  Constitutional: Negative.   Eyes: Negative.   Respiratory: Negative.    Cardiovascular: Negative.   Gastrointestinal: Negative.   Genitourinary: Negative.        Objective:   Physical Exam Cardiovascular:     Rate and Rhythm: Normal rate.     Pulses: Normal pulses.  Pulmonary:     Effort: Pulmonary effort is normal.  Skin:    General: Skin is warm.     Capillary Refill: Capillary refill takes less than 2 seconds.  Neurological:     Mental Status: She is oriented to person, place, and time.  Psychiatric:        Mood and Affect: Mood normal.        Behavior: Behavior normal.        Thought Content: Thought content normal.        Judgment: Judgment normal.        Assessment & Plan:     ICD-10-CM   1. 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 Breast pain  N64.4       The patient wants to give it more time.  She knows the options are limited without further surgery.  We will refer her to the aesthetician for facial wrinkling.  She also wants to talk with [facility] about nipple areola tattooing on the left breast  The patient also asked about doing an US  of the left breast and this is a reasonable request due to her history.   Pictures were obtained of the patient and placed in the chart with the patient's or guardian's permission.

## 2023-05-11 NOTE — Addendum Note (Signed)
 Addended by: Peggye Form on: 05/11/2023 12:27 PM   Modules accepted: Orders

## 2023-05-12 ENCOUNTER — Other Ambulatory Visit: Payer: Self-pay | Admitting: Plastic Surgery

## 2023-05-12 DIAGNOSIS — Z9013 Acquired absence of bilateral breasts and nipples: Secondary | ICD-10-CM

## 2023-05-14 ENCOUNTER — Ambulatory Visit: Payer: 59 | Admitting: Plastic Surgery

## 2023-05-20 ENCOUNTER — Institutional Professional Consult (permissible substitution): Admitting: Surgical

## 2023-05-26 ENCOUNTER — Other Ambulatory Visit: Payer: Self-pay | Admitting: *Deleted

## 2023-05-26 ENCOUNTER — Encounter: Payer: Self-pay | Admitting: Surgical

## 2023-05-26 ENCOUNTER — Ambulatory Visit: Admitting: Surgical

## 2023-05-26 ENCOUNTER — Encounter: Payer: Self-pay | Admitting: Hematology and Oncology

## 2023-05-26 VITALS — BP 100/63 | HR 74

## 2023-05-26 DIAGNOSIS — Z9013 Acquired absence of bilateral breasts and nipples: Secondary | ICD-10-CM

## 2023-05-26 DIAGNOSIS — D689 Coagulation defect, unspecified: Secondary | ICD-10-CM

## 2023-05-26 DIAGNOSIS — Z171 Estrogen receptor negative status [ER-]: Secondary | ICD-10-CM

## 2023-05-26 DIAGNOSIS — C50411 Malignant neoplasm of upper-outer quadrant of right female breast: Secondary | ICD-10-CM

## 2023-05-26 MED ORDER — ENOXAPARIN SODIUM 60 MG/0.6ML IJ SOSY: 60.0000 mg | PREFILLED_SYRINGE | Freq: Two times a day (BID) | INTRAMUSCULAR | 0 refills | Status: DC

## 2023-05-26 NOTE — Progress Notes (Signed)
   Referring Provider Jearldine Mina, MD [address] [place],  [place] [address]   CC:  Chief Complaint  Patient presents with   Advice Only      Sierra Perez is an 63 y.o. female.  HPI: Patient is a 63 year old female here for consultation to discuss nipple areola tattoo.  She has a history of breast reconstruction, subsequently had to have the right breast implant and capsule removed due to tightness and capsular contracture due to radiation.  She still has her left breast implant in place, 400 cc Mentor smooth round moderate profile plus gel implant.  Implants were placed in a prepectoral plane.  She is interested in nipple areola tattooing on the left side.  She does have a history of blood clots and atrial fibrillation for which she is on Eliquis .  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  Physical Exam    05/26/2023    9:35 AM 05/11/2023    9:56 AM 05/03/2023   11:16 AM  Vitals with BMI  Height  5\' 8"  5\' 8"   Weight  149 lbs 13 oz 152 lbs 3 oz  BMI  22.78 23.15  Systolic 100 127 96  Diastolic 63 84 68  Pulse 74 71 67    General:  No acute distress,  Alert and oriented, Non-Toxic, Normal speech and affect Left breast: Left breast incision is intact and well-healed.  Left breast implant is soft.  She does have some thinning of the left mastectomy skin.  There is no erythema or cellulitic change.  Capsular soft.  No subcutaneous fluid collection noted palpation.  No tenderness noted with palpation.   Assessment/Plan 63 year old female here for consultation to discuss nipple areola tattoo.  She does not have any history of tattoos in the past.  She is on blood thinners, we will obtain clearance from hematology/oncology for her to hold 24 hours prior to procedure.  We did discuss that it could be possible for her to have the tattoo while on Eliquis , however did not think  that this was the best option and would like to discuss with hematology/obtain clearance.  If she is unable to hold for 24 hours prior to tattoo, will discuss with patient.  We discussed the process of nipple areola tattooing including the possibility of multiple treatments being needed due to possibility of fading, skin being intolerant of tattoo completion in 1 session.  Patient was understanding of this.  We discussed that size, location and color is something we would discuss throughout the procedure.  All of her questions were answered to her content.  We will plan to set up a telephone visit in 1 week to further discuss any questions she may have and to follow-up on clearances from hematology/oncology.    Sierra Perez 05/26/2023, 10:16 AM

## 2023-05-26 NOTE — Progress Notes (Signed)
 Received mychart message from pt stating she is going to have a nipple tattoo done. Pt states she is currently on Eliquis  for hx of afib as well.  Verbal orders received and placed for pt to be prescribed Lovenox  60 mg BID x48 hrs prior to tattoo, nothing the day of tattoo, and resume Eliquis  day after.  Prescription sent to pharmacy on file, pt educated and verbalized understanding.

## 2023-05-31 ENCOUNTER — Encounter

## 2023-06-02 ENCOUNTER — Ambulatory Visit (INDEPENDENT_AMBULATORY_CARE_PROVIDER_SITE_OTHER): Admitting: Surgical

## 2023-06-02 DIAGNOSIS — C50411 Malignant neoplasm of upper-outer quadrant of right female breast: Secondary | ICD-10-CM

## 2023-06-02 DIAGNOSIS — Z853 Personal history of malignant neoplasm of breast: Secondary | ICD-10-CM

## 2023-06-02 DIAGNOSIS — Z9013 Acquired absence of bilateral breasts and nipples: Secondary | ICD-10-CM | POA: Diagnosis not present

## 2023-06-02 DIAGNOSIS — N6092 Unspecified benign mammary dysplasia of left breast: Secondary | ICD-10-CM

## 2023-06-02 DIAGNOSIS — Z86718 Personal history of other venous thrombosis and embolism: Secondary | ICD-10-CM | POA: Diagnosis not present

## 2023-06-02 DIAGNOSIS — Z7901 Long term (current) use of anticoagulants: Secondary | ICD-10-CM | POA: Diagnosis not present

## 2023-06-02 NOTE — Progress Notes (Signed)
 Patient is a very pleasant 63 year old female who presents via telephone visit to discuss nipple areola tattoo.  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  She reports that she is wanting to proceed with nipple areola tattoo.  I discussed with patient that we will submit to insurance for her and once we receive authorization we can plan for the tattoo.  We discussed initial session about 2 hours or so.  All of her questions were answered to her content.  She will start Lovenox  48 hours prior to procedure, hold Lovenox  day of procedure and restart Eliquis  the following day.  The patient gave consent to have this visit done by telemedicine / virtual visit, two identifiers were used to identify patient. This is also consent for access the chart and treat the patient via this visit. The patient is located in [place] .  I, the provider, am at the office.  We spent 5 minutes together for the visit.  Joined by telephone.

## 2023-06-07 ENCOUNTER — Encounter

## 2023-06-16 ENCOUNTER — Other Ambulatory Visit: Payer: Self-pay

## 2023-06-18 ENCOUNTER — Encounter: Payer: Self-pay | Admitting: *Deleted

## 2023-06-18 NOTE — Progress Notes (Signed)
 [employer]al orders placed per MD request.

## 2023-06-21 ENCOUNTER — Other Ambulatory Visit: Payer: Self-pay | Admitting: Physician Assistant

## 2023-06-21 ENCOUNTER — Telehealth: Payer: Self-pay | Admitting: Plastic Surgery

## 2023-06-21 DIAGNOSIS — Z719 Counseling, unspecified: Secondary | ICD-10-CM

## 2023-06-21 NOTE — Telephone Encounter (Signed)
 [facility] called about pt apt for US  which is scheduled with them tomorrow 06-22-23. They need the DX updated. There has to be an issue they stated for them to do it. The dx needs to state for example breast problems such as tenderness, breast pain, nipple issues. Please update as soon as you can. Pt apt is tomorrow 06-22-23. Thank you so much

## 2023-06-22 ENCOUNTER — Ambulatory Visit
Admission: RE | Admit: 2023-06-22 | Discharge: 2023-06-22 | Disposition: A | Discharge status: Home / Self Care | Source: Ambulatory Visit | Attending: Plastic Surgery | Admitting: Plastic Surgery

## 2023-06-22 ENCOUNTER — Other Ambulatory Visit: Payer: Self-pay | Admitting: Plastic Surgery

## 2023-06-22 ENCOUNTER — Other Ambulatory Visit

## 2023-06-22 DIAGNOSIS — Z9013 Acquired absence of bilateral breasts and nipples: Secondary | ICD-10-CM

## 2023-06-22 DIAGNOSIS — N644 Mastodynia: Secondary | ICD-10-CM

## 2023-06-22 NOTE — Addendum Note (Signed)
 Addended by: Thornell Flirt on: 06/22/2023 09:03 AM   Modules accepted: Orders

## 2023-06-22 NOTE — Telephone Encounter (Signed)
 From what I can tell from the notes, it looks like US  was ordered to investigate the status of the left breast implant given patient's history. Sierra Perez would you mind calling the breast center please?

## 2023-07-13 ENCOUNTER — Other Ambulatory Visit: Payer: Self-pay | Admitting: Surgical

## 2023-07-13 DIAGNOSIS — C50411 Malignant neoplasm of upper-outer quadrant of right female breast: Secondary | ICD-10-CM

## 2023-07-13 DIAGNOSIS — Z9013 Acquired absence of bilateral breasts and nipples: Secondary | ICD-10-CM

## 2023-07-19 ENCOUNTER — Encounter: Payer: Self-pay | Admitting: Physician Assistant

## 2023-07-19 ENCOUNTER — Ambulatory Visit (INDEPENDENT_AMBULATORY_CARE_PROVIDER_SITE_OTHER): Admitting: Physician Assistant

## 2023-07-19 ENCOUNTER — Other Ambulatory Visit: Payer: Self-pay

## 2023-07-19 DIAGNOSIS — M7062 Trochanteric bursitis, left hip: Secondary | ICD-10-CM | POA: Diagnosis not present

## 2023-07-19 DIAGNOSIS — M7631 Iliotibial band syndrome, right leg: Secondary | ICD-10-CM | POA: Diagnosis not present

## 2023-07-19 DIAGNOSIS — Z96651 Presence of right artificial knee joint: Secondary | ICD-10-CM

## 2023-07-19 DIAGNOSIS — M7061 Trochanteric bursitis, right hip: Secondary | ICD-10-CM | POA: Diagnosis not present

## 2023-07-19 NOTE — Progress Notes (Signed)
 HPI: [name] returns today due to right knee pain.  States right knee pain began recently and is keeping her from exercising.  She would like to play tennis.  She has had multiple medical problems and has not been able to exercise.  She has had no injury though.  History of right total knee arthroplasty 01/09/2022.  States knee feels stiff achy.  Most the pain is laterally.  Review of systems: Negative for fevers chills or ongoing infections  Physical exam: General Well-developed well-nourished female no acute distress mood affect appropriate. Psych: Alert and oriented x 3 Lower extremities: Bilateral hips good range of motion.  Tenderness right greater than left trochanteric region.  Tenderness distal left IT band insertion.  Bilateral knees no abnormal warmth erythema or effusion.  No instability valgus varus stressing of either knee anterior drawers negative bilaterally.  Quad atrophy bilaterally.  Radiographs: Right knee 2 views: No acute fractures acute findings.  Right knee arthroplasty components well-seated.  No hardware failure.  Knee is well located.  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  Will send her to formal physical therapy at [facility] well for quad strengthening IT band stretching home exercise program and modalities.  She still avoid abduction exercises and these were discussed with her.  Offered distal IT band injection in the right knee she defers.  Follow-up with Dr. Alvester Johnson in 6 weeks see how she is doing overall.  She is not a candidate for NSAIDs orally due to the fact that she is on Eliquis  chronically.

## 2023-07-20 ENCOUNTER — Other Ambulatory Visit: Payer: Self-pay | Admitting: Radiology

## 2023-07-20 DIAGNOSIS — Z96651 Presence of right artificial knee joint: Secondary | ICD-10-CM

## 2023-07-20 DIAGNOSIS — M7631 Iliotibial band syndrome, right leg: Secondary | ICD-10-CM

## 2023-07-21 ENCOUNTER — Other Ambulatory Visit: Payer: Self-pay

## 2023-07-21 ENCOUNTER — Other Ambulatory Visit: Payer: Self-pay | Admitting: Cardiology

## 2023-07-21 DIAGNOSIS — Z9013 Acquired absence of bilateral breasts and nipples: Secondary | ICD-10-CM

## 2023-07-21 DIAGNOSIS — N644 Mastodynia: Secondary | ICD-10-CM

## 2023-07-27 NOTE — Therapy (Signed)
 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, her-2 positive, ER/PR negative   Clotting disorder (HCC)    testing was negative, h/o DVT while on treatment   Colon polyps    Complex regional pain syndrome i of right lower limb    RDS   Complication of anesthesia    Cystitis    Diabetes mellitus without complication (HCC)    DVT (deep venous thrombosis) (HCC)    started in 2015, multiple   Dysrhythmia    Family history of breast cancer    Family history of colon cancer    Fibroid    Fibromyalgia    GERD (gastroesophageal reflux disease)    Heart murmur    HLD (hyperlipidemia)    HPV in female    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   Hypertension    Hypothyroidism    IBS (irritable bowel syndrome)    Mitral valve prolapse    Peptic ulcer    Personal history of malignant neoplasm of breast    Sleep apnea treated with continuous positive airway pressure (CPAP)    Past Surgical History:  Procedure Laterality Date   ABDOMINAL HYSTERECTOMY  2010   fibroids, h/o abnormal pap smears   AXILLARY SENTINEL NODE BIOPSY Right 07/08/2020   Procedure: RIGHT AXILLARY SENTINEL NODE BIOPSY;  Surgeon: Ebbie Cough, MD;  Location: [facility] OR;  Service: General;  Laterality: Right;   BREAST IMPLANT REMOVAL Right 09/21/2022   Procedure: REMOVAL BREAST IMPLANTS;  Surgeon: Lowery Estefana RAMAN, DO;  Location: [facility];  Service: Plastics;  Laterality: Right;   BREAST RECONSTRUCTION WITH PLACEMENT OF TISSUE EXPANDER AND FLEX HD (ACELLULAR HYDRATED DERMIS) Bilateral 07/08/2020   Procedure: BILATERAL BREAST RECONSTRUCTION WITH  FLEX HD (ACELLULAR HYDRATED DERMIS),DIRECT IMPLANT RECONSTRUCTION;  Surgeon: Elisabeth Craig RAMAN, MD;  Location: [facility] OR;  Service: Plastics;  Laterality: Bilateral;   BREAST SURGERY Right 2015   right lumpectomy with reduction and lift   COLONOSCOPY     DENTAL SURGERY     ESOPHAGOGASTRODUODENOSCOPY     LAPAROSCOPIC GASTRIC SLEEVE RESECTION  2014   LAPAROSCOPIC OOPHERECTOMY Bilateral 2016   LEFT HEART CATH AND CORONARY ANGIOGRAPHY N/A 07/18/2022   Procedure: LEFT HEART CATH AND CORONARY ANGIOGRAPHY;  Surgeon: Anner Alm ORN, MD;  Location: [facility] INVASIVE CV LAB;  Service: Cardiovascular;  Laterality: N/A;   PERICARDIOCENTESIS N/A 07/20/2022   Procedure: PERICARDIOCENTESIS;  Surgeon: Anner Alm ORN, MD;  Location: [facility] INVASIVE CV LAB;  Service: Cardiovascular;  Laterality: N/A;   REPLACEMENT TOTAL KNEE Left 2018   also had 3 prior arthroscopies   TOE SURGERY Right    dislocated toe   TOTAL HIP ARTHROPLASTY Right 11/19/2020   Procedure: RIGHT TOTAL HIP ARTHROPLASTY  ANTERIOR APPROACH;  Surgeon: Vernetta Lonni GRADE, MD;  Location: [facility] OR;  Service: Orthopedics;  Laterality: Right;   TOTAL KNEE ARTHROPLASTY Right 01/09/2022   Procedure: RIGHT TOTAL KNEE ARTHROPLASTY;  Surgeon: Vernetta Lonni GRADE, MD;  Location: [facility] ORS;  Service: Orthopedics;  Laterality: Right;   TOTAL MASTECTOMY Bilateral 07/08/2020   Procedure: BILATERAL TOTAL MASTECTOMY;  Surgeon: Ebbie Cough, MD;  Location: [facility] OR;  Service: General;  Laterality: Bilateral;   VENA CAVA FILTER PLACEMENT  2017   Patient Active Problem List   Diagnosis Date Noted   Hyperlipidemia associated with type 2 diabetes mellitus (HCC) 05/05/2023   Essential hypertension => INCORRECT  05/05/2023   Breast cancer (HCC) 09/21/2022   Acquired absence of breast and absent nipple 08/24/2022   PAF (paroxysmal atrial fibrillation) (HCC) 07/22/2022   Pericardial effusion 07/19/2022   ST elevation 07/18/2022   Breast pain 07/18/2022   Pericarditis 07/18/2022   Nail dystrophy 05/05/2022   Status post total right knee replacement 01/09/2022   Iron  deficiency anemia 01/23/2021   Status post right hip replacement 12/03/2020   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   H/O total hysterectomy 06/11/2020   Chronic superficial gastritis 05/27/2020   Fibromyalgia 05/27/2020   GERD (gastroesophageal reflux disease) 05/27/2020   Complex regional pain syndrome I, unspecified 05/27/2020   Atypical lobular hyperplasia [facility]) of left breast 05/23/2020   History of therapeutic radiation 05/23/2020   Family history of breast cancer    Family history of colon cancer    Unilateral primary osteoarthritis, right knee 02/26/2020   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   DVT (deep venous thrombosis) (HCC) 05/04/2018   Malignant neoplasm of upper-outer quadrant of right breast in female, estrogen receptor negative (HCC) 05/03/2018   Obstructive sleep apnea (adult) (pediatric) 09/14/2017   Adenopathy 07/15/2017   B12 deficiency 07/15/2017   Disorder of tympanic membrane of right ear 07/15/2017   Recurrent UTI 03/09/2017   Proteinuria 02/25/2017   At risk for obstructive sleep apnea 07/01/2016   Suspected sleep apnea 07/01/2016   Arthritis of left knee 05/26/2016   Localized primary osteoarthritis of lower leg, left 05/26/2016   Clotting disorder (HCC) 05/22/2016   Chronic anticoagulation 01/06/2016   Pelvic and perineal pain 11/28/2013   Anxiety and depression 11/16/2013   H/O bariatric surgery 11/16/2013   Atrophic vaginitis 11/13/2013   Tear of lateral cartilage or meniscus of knee, current 04/14/2013   Allergic rhinitis 01/16/2013   Symptomatic menopausal or female climacteric states 11/10/2012    Disequilibrium 06/09/2012   Vitamin D  deficiency 09/13/2008   Preop examination 04/20/2008   Hypothyroidism 05/20/2007    PCP: Ransom Other, MD  REFERRING PROVIDER: Gretta Bertrum ORN, PA-C  REFERRING DIAG: [phone] (ICD-10-CM) - Status post total right knee replacement M76.31 (ICD-10-CM) - It band syndrome, right  THERAPY DIAG:  Right knee pain, unspecified chronicity  Muscle weakness (generalized)  Unspecified lack of coordination  Aftercare following surgery for neoplasm  Postmastectomy lymphedema  Disorder of the skin and subcutaneous tissue related to radiation, unspecified  Abnormal posture  History of malignant neoplasm of right breast  Localized edema  Unsteadiness on feet  Rationale for Evaluation and Treatment: Rehabilitation  ONSET DATE: R TKA 01/09/2022  SUBJECTIVE:   SUBJECTIVE STATEMENT: Patient states that she was doing okay after her knee replacement and rehab, but then she had some heart issues.  Patient states that she had to have a heart surgery that required her to be sedentary.  She states that she was trying to get back to  exercising again, but started to have some right knee pain and stiffness.  PERTINENT HISTORY: Heart Cath with pericardicentesis, Rt THA 2022, Rt TKA 01/09/22, Lt TKA 2018, Hx of Breast Cancer s/p double mastectomy with sentinel lymph node removed from right side   PAIN:  Are you having pain? Yes: NPRS scale: 0-5/10 Pain location: right knee Pain description: aching, sore Aggravating factors: exercise Relieving factors: rest  PRECAUTIONS: Other: bilat mastectomy  RED FLAGS: None   WEIGHT BEARING RESTRICTIONS: No  FALLS:  Has patient fallen in last 6 months? No  LIVING ENVIRONMENT: Lives with: lives alone Lives in: House/apartment Stairs: Yes: Internal: 1 flight steps; can reach both Has following equipment at home: Single point cane and Walker - 2 wheeled  OCCUPATION: Working part-time at [employer]  PLOF:  Independent and Leisure: traveling, exercise, reading, gardening, walking her dog (standard poodle)  PATIENT GOALS: Wants to be able to play tennis again and travel without increased pain.  NEXT MD VISIT: 09/06/2023 with Dr Vernetta  OBJECTIVE:  Note: Objective measures were completed at Evaluation unless otherwise noted.  DIAGNOSTIC FINDINGS: Radiographs of knee reveal well placed joint  PATIENT SURVEYS:  Eval: Lower Extremity Functional Scale:  56 / 80 = 70.0 %    COGNITION: Overall cognitive status: Within functional limits for tasks assessed     SENSATION: Patient denies numbness and tingling  EDEMA:  Patient states that sometimes she notices right knee edema  MUSCLE LENGTH: Hamstrings: right hamstring with some increased tightness over left  POSTURE: rounded shoulders  PALPATION: Some tenderness to palpation along right knee  LOWER EXTREMITY ROM:  Active ROM Right eval Left eval  Knee flexion 0 0  Knee extension 127 with pain 132   (Blank rows = not tested)  LOWER EXTREMITY MMT:  Eval: Right hip strength 4/5 Right quad strength 4+/5 Right hamstring strength 4/5 Left LE strength is WFL  FUNCTIONAL TESTS:  Eval: 5 times sit to stand: 12.34 sec without UE use (patient bearing most of her weight through LLE) 6 minute walk test: 1,396 ft with knee pain 3/10 (Age related norm is 1,713 ft)  GAIT: Distance walked: >1000 ft Assistive device utilized: None Level of assistance: Complete Independence Comments: with increased right knee pain                                                                                                                                TREATMENT DATE:  07/28/2023: Reviewed HEP and provided with handout for exercises    PATIENT EDUCATION:  Education details: Issued HEP Person educated: Patient Education method: Explanation, Demonstration, and Handouts Education comprehension: verbalized understanding  HOME EXERCISE  PROGRAM: Access Code: [email or web address] URL: [email or web address] Date: 07/28/2023 Prepared by: Jarrell Laming  Exercises - Active Straight Leg Raise with Quad Set  - 1 x daily - 7 x weekly - 2 sets - 10 reps - Seated Long Arc Quad  - 3 x daily - 7  x weekly - 1 sets - 10 reps - 5 hold - Standing Marching  - 1-2 x daily - 7 x weekly - 1 sets - 10 reps - Standing Knee Flexion AROM with Chair Support  - 1 x daily - 7 x weekly - 2 sets - 10 reps - Standing Hip Abduction with Counter Support  - 1 x daily - 7 x weekly - 2 sets - 10 reps - Standing Hamstring Stretch with Step  - 1 x daily - 7 x weekly - 1 sets - 2 reps - 20 sec hold - Mini Squat with Counter Support  - 1 x daily - 7 x weekly - 2 sets - 10 reps  ASSESSMENT:  CLINICAL IMPRESSION: Patient is a 63 y.o. female who was seen today for physical therapy evaluation and treatment for right knee pain s/p R TKA in December 2023.  Patient is known to this PT for treatment after her right knee arthroplasty in 2023 with discharge from ortho PT in February 2024.  Patient states that she was doing well and exercising at [facility], however, since that time, she has had some other health concerns and heart surgery.  Patient states that due to her health conditions and surgical restrictions, she had to be sedentary.  She states that she was cleared to resume exercise again, but when she started exercising, she started having more knee pain.  Patient's imaging of her right knee did not reveal any problems with her knee prosthetic, but she was noted to have atrophy in her right quad.  Patient was referred to PT to address her impairments.  Patient noted to have decreased weight bearing through right LE during sit to stand and with ambulation.  Patient has right knee A/ROM that is [facility], however, she has increased pain/discomfort at end range.  Patient presents with muscle weakness, difficulty walking, and difficulty performing functional tasks as she did  before.  She states that she nearly fell recently when walking her dog when it unexpectedly pulled on the leash, but she was able to recover her balance.  Patient would benefit from skilled PT to address her goal related activities to allow her to return to her active lifestyle and resume playing tennis.   OBJECTIVE IMPAIRMENTS: decreased activity tolerance, decreased balance, difficulty walking, decreased ROM, decreased strength, impaired flexibility, postural dysfunction, and pain.   ACTIVITY LIMITATIONS: squatting, stairs, and locomotion level  PARTICIPATION LIMITATIONS: community activity, occupation, and yard work  PERSONAL FACTORS: Time since onset of injury/illness/exacerbation and 3+ comorbidities: Hx of breast cancer with bilateral mastectomy (recent implant removal), heart cath with pericardicentesis, bilateral TKA, right THA are also affecting patient's functional outcome.   REHAB POTENTIAL: Good  CLINICAL DECISION MAKING: Evolving/moderate complexity  EVALUATION COMPLEXITY: Moderate   GOALS: Goals reviewed with patient? Yes  SHORT TERM GOALS: Target date: 08/20/2023 Pt will be independent with initial HEP to improve ROM and mobility.  Baseline: Goal status: INITIAL  2.  Patient will increase right knee A/ROM to [facility] without pain at end-range Baseline:  Goal status: INITIAL   LONG TERM GOALS: Target date: 09/17/2023  Patient will be independent with advanced HEP to allow for self progression after discharge. Baseline:  Goal status: INITIAL  2.  Patient will improve score of Lower Extremity Functional Scale to at least 80% to demonstrate improvements in functional tasks. Baseline: 70.0% Goal status: INITIAL  3.  Patient will improve 6 minute walk test to greater than 1,700 ft to place her at her age  related norms. Baseline: 1,396 ft with pain of 3/10 in right knee Goal status: INITIAL  4.  Patient will increase right knee/hip strength to [facility] to allow for patient to  safely return to playing tennis/performing tennis drills. Baseline:  Goal status: INITIAL  5.  Patient will report ability to walk her dog for desired length of time without increased knee pain or loss of balance. Baseline:  Goal status: INITIAL    PLAN:  PT FREQUENCY: 1-2x/week  PT DURATION: 8 weeks  PLANNED INTERVENTIONS: [id number]- PT Re-evaluation, 97750- Physical Performance Testing, 97110-Therapeutic exercises, 97530- Therapeutic activity, 97112- Neuromuscular re-education, 97535- Self Care, [id number]- Manual therapy, [phone]- Gait training, [phone]- Canalith repositioning, [id number]- Aquatic Therapy, [phone]- Electrical stimulation (unattended), [phone]- Electrical stimulation (manual), [id number]- Vasopneumatic device, [id number]- Ultrasound, [id number]- Ionotophoresis 4mg /ml Dexamethasone , [id number] (1-2 muscles), [id number] (3+ muscles)- Dry Needling, Patient/Family education, Balance training, Stair training, Taping, Joint mobilization, Joint manipulation, Scar mobilization, Vestibular training, Cryotherapy, and Moist heat  PLAN FOR NEXT SESSION: Assess and progress HEP as indicated, strengthening, flexibility, manual/dry needling as indicated    Jarrell Laming, PT, DPT 07/28/23, 8:58 AM  [facility] [address], [address] [place], [place] [address] Phone # [phone] Fax [phone]

## 2023-07-28 ENCOUNTER — Other Ambulatory Visit: Payer: Self-pay

## 2023-07-28 ENCOUNTER — Encounter: Payer: Self-pay | Admitting: Rehabilitative and Restorative Service Providers"

## 2023-07-28 ENCOUNTER — Ambulatory Visit: Attending: Physician Assistant | Admitting: Rehabilitative and Restorative Service Providers"

## 2023-07-28 DIAGNOSIS — R293 Abnormal posture: Secondary | ICD-10-CM | POA: Diagnosis present

## 2023-07-28 DIAGNOSIS — R600 Localized edema: Secondary | ICD-10-CM | POA: Insufficient documentation

## 2023-07-28 DIAGNOSIS — L599 Disorder of the skin and subcutaneous tissue related to radiation, unspecified: Secondary | ICD-10-CM | POA: Diagnosis present

## 2023-07-28 DIAGNOSIS — M6281 Muscle weakness (generalized): Secondary | ICD-10-CM | POA: Insufficient documentation

## 2023-07-28 DIAGNOSIS — I972 Postmastectomy lymphedema syndrome: Secondary | ICD-10-CM | POA: Diagnosis present

## 2023-07-28 DIAGNOSIS — Z483 Aftercare following surgery for neoplasm: Secondary | ICD-10-CM | POA: Insufficient documentation

## 2023-07-28 DIAGNOSIS — R2681 Unsteadiness on feet: Secondary | ICD-10-CM | POA: Insufficient documentation

## 2023-07-28 DIAGNOSIS — M25561 Pain in right knee: Secondary | ICD-10-CM | POA: Insufficient documentation

## 2023-07-28 DIAGNOSIS — M7631 Iliotibial band syndrome, right leg: Secondary | ICD-10-CM | POA: Diagnosis not present

## 2023-07-28 DIAGNOSIS — Z96651 Presence of right artificial knee joint: Secondary | ICD-10-CM | POA: Diagnosis not present

## 2023-07-28 DIAGNOSIS — R279 Unspecified lack of coordination: Secondary | ICD-10-CM | POA: Diagnosis present

## 2023-07-28 DIAGNOSIS — Z853 Personal history of malignant neoplasm of breast: Secondary | ICD-10-CM | POA: Insufficient documentation

## 2023-08-03 ENCOUNTER — Ambulatory Visit: Attending: Physician Assistant | Admitting: Rehabilitative and Restorative Service Providers"

## 2023-08-03 ENCOUNTER — Encounter: Payer: Self-pay | Admitting: Rehabilitative and Restorative Service Providers"

## 2023-08-03 DIAGNOSIS — M25561 Pain in right knee: Secondary | ICD-10-CM | POA: Diagnosis present

## 2023-08-03 DIAGNOSIS — M6281 Muscle weakness (generalized): Secondary | ICD-10-CM | POA: Insufficient documentation

## 2023-08-03 DIAGNOSIS — R279 Unspecified lack of coordination: Secondary | ICD-10-CM | POA: Insufficient documentation

## 2023-08-03 DIAGNOSIS — R600 Localized edema: Secondary | ICD-10-CM | POA: Insufficient documentation

## 2023-08-03 DIAGNOSIS — R2681 Unsteadiness on feet: Secondary | ICD-10-CM | POA: Insufficient documentation

## 2023-08-03 DIAGNOSIS — R293 Abnormal posture: Secondary | ICD-10-CM | POA: Diagnosis present

## 2023-08-03 NOTE — Therapy (Signed)
 OUTPATIENT PHYSICAL THERAPY TREATMENT NOTE   Patient Name: Sierra Perez MRN: [id number] DOB:03/03/1960, 63 y.o., female Today's Date: 08/03/2023  END OF SESSION:  PT End of Session - 08/03/23 0735     Visit Number 2    Date for PT Re-Evaluation 09/17/23    Authorization Type [employer]    Authorization - Visit Number 2    Authorization - Number of Visits 23    PT Start Time [phone]    PT Stop Time 0800    PT Time Calculation (min) 29 min    Activity Tolerance Patient tolerated treatment well    Behavior During Therapy WFL for tasks assessed/performed          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, her-2 positive, ER/PR negative   Clotting disorder (HCC)    testing was negative, h/o DVT while on treatment   Colon polyps    Complex regional pain syndrome i of right lower limb    RDS   Complication of anesthesia    Cystitis    Diabetes mellitus without complication (HCC)    DVT (deep venous thrombosis) (HCC)    started in 2015, multiple   Dysrhythmia    Family history of breast cancer    Family history of colon cancer    Fibroid    Fibromyalgia    GERD (gastroesophageal reflux disease)    Heart murmur    HLD (hyperlipidemia)    HPV in female    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   Hypertension    Hypothyroidism    IBS (irritable bowel syndrome)    Mitral valve prolapse    Peptic ulcer    Personal history of malignant neoplasm of breast    Sleep apnea treated with continuous positive airway pressure (CPAP)    Past Surgical History:  Procedure Laterality Date   ABDOMINAL HYSTERECTOMY  2010   fibroids, h/o abnormal pap smears   AXILLARY SENTINEL NODE BIOPSY Right 07/08/2020   Procedure: RIGHT AXILLARY SENTINEL NODE BIOPSY;  Surgeon: Ebbie Cough, MD;  Location: [facility] OR;  Service: General;  Laterality: Right;   BREAST IMPLANT REMOVAL Right 09/21/2022    Procedure: REMOVAL BREAST IMPLANTS;  Surgeon: Lowery Estefana RAMAN, DO;  Location: [facility];  Service: Plastics;  Laterality: Right;   BREAST RECONSTRUCTION WITH PLACEMENT OF TISSUE EXPANDER AND FLEX HD (ACELLULAR HYDRATED DERMIS) Bilateral 07/08/2020   Procedure: BILATERAL BREAST RECONSTRUCTION WITH  FLEX HD (ACELLULAR HYDRATED DERMIS),DIRECT IMPLANT RECONSTRUCTION;  Surgeon: Elisabeth Craig RAMAN, MD;  Location: [facility] OR;  Service: Plastics;  Laterality: Bilateral;   BREAST SURGERY Right 2015   right lumpectomy with reduction and lift   COLONOSCOPY     DENTAL SURGERY     ESOPHAGOGASTRODUODENOSCOPY     LAPAROSCOPIC GASTRIC SLEEVE RESECTION  2014   LAPAROSCOPIC OOPHERECTOMY Bilateral 2016   LEFT HEART CATH AND CORONARY ANGIOGRAPHY N/A 07/18/2022   Procedure: LEFT HEART CATH AND CORONARY ANGIOGRAPHY;  Surgeon: Anner Alm ORN, MD;  Location: [facility] INVASIVE CV LAB;  Service: Cardiovascular;  Laterality: N/A;   PERICARDIOCENTESIS N/A 07/20/2022   Procedure: PERICARDIOCENTESIS;  Surgeon: Anner Alm ORN, MD;  Location: [facility] INVASIVE CV LAB;  Service: Cardiovascular;  Laterality: N/A;   REPLACEMENT TOTAL KNEE Left 2018   also had 3 prior arthroscopies   TOE SURGERY Right  dislocated toe   TOTAL HIP ARTHROPLASTY Right 11/19/2020   Procedure: RIGHT TOTAL HIP ARTHROPLASTY ANTERIOR APPROACH;  Surgeon: Vernetta Lonni GRADE, MD;  Location: [facility] OR;  Service: Orthopedics;  Laterality: Right;   TOTAL KNEE ARTHROPLASTY Right 01/09/2022   Procedure: RIGHT TOTAL KNEE ARTHROPLASTY;  Surgeon: Vernetta Lonni GRADE, MD;  Location: [facility];  Service: Orthopedics;  Laterality: Right;   TOTAL MASTECTOMY Bilateral 07/08/2020   Procedure: BILATERAL TOTAL MASTECTOMY;  Surgeon: Ebbie Cough, MD;  Location: [facility] OR;  Service: General;  Laterality: Bilateral;   VENA CAVA FILTER PLACEMENT  2017   Patient Active Problem List   Diagnosis Date Noted   Hyperlipidemia associated with type 2 diabetes mellitus  (HCC) 05/05/2023   Essential hypertension => INCORRECT 05/05/2023   Breast cancer (HCC) 09/21/2022   Acquired absence of breast and absent nipple 08/24/2022   PAF (paroxysmal atrial fibrillation) (HCC) 07/22/2022   Pericardial effusion 07/19/2022   ST elevation 07/18/2022   Breast pain 07/18/2022   Pericarditis 07/18/2022   Nail dystrophy 05/05/2022   Status post total right knee replacement 01/09/2022   Iron  deficiency anemia 01/23/2021   Status post right hip replacement 12/03/2020   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   H/O total hysterectomy 06/11/2020   Chronic superficial gastritis 05/27/2020   Fibromyalgia 05/27/2020   GERD (gastroesophageal reflux disease) 05/27/2020   Complex regional pain syndrome I, unspecified 05/27/2020   Atypical lobular hyperplasia [facility]) of left breast 05/23/2020   History of therapeutic radiation 05/23/2020   Family history of breast cancer    Family history of colon cancer    Unilateral primary osteoarthritis, right knee 02/26/2020   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   DVT (deep venous thrombosis) (HCC) 05/04/2018   Malignant neoplasm of upper-outer quadrant of right breast in female, estrogen receptor negative (HCC) 05/03/2018   Obstructive sleep apnea (adult) (pediatric) 09/14/2017   Adenopathy 07/15/2017   B12 deficiency 07/15/2017   Disorder of tympanic membrane of right ear 07/15/2017   Recurrent UTI 03/09/2017   Proteinuria 02/25/2017   At risk for obstructive sleep apnea 07/01/2016   Suspected sleep apnea 07/01/2016   Arthritis of left knee 05/26/2016   Localized primary osteoarthritis of lower leg, left 05/26/2016   Clotting disorder (HCC) 05/22/2016   Chronic anticoagulation 01/06/2016   Pelvic and perineal pain 11/28/2013   Anxiety and depression 11/16/2013   H/O bariatric surgery 11/16/2013   Atrophic vaginitis 11/13/2013   Tear of lateral cartilage or meniscus of knee, current 04/14/2013   Allergic rhinitis 01/16/2013   Symptomatic  menopausal or female climacteric states 11/10/2012   Disequilibrium 06/09/2012   Vitamin D  deficiency 09/13/2008   Preop examination 04/20/2008   Hypothyroidism 05/20/2007    PCP: Ransom Other, MD  REFERRING PROVIDER: Gretta Bertrum ORN, PA-C  REFERRING DIAG: [phone] (ICD-10-CM) - Status post total right knee replacement M76.31 (ICD-10-CM) - It band syndrome, right  THERAPY DIAG:  Right knee pain, unspecified chronicity  Muscle weakness (generalized)  Unspecified lack of coordination  Localized edema  Unsteadiness on feet  Rationale for Evaluation and Treatment: Rehabilitation  ONSET DATE: R TKA 01/09/2022  SUBJECTIVE:   SUBJECTIVE STATEMENT: Patient denies any knee pain, but states that her right heel/ankle is hurting more.  PERTINENT HISTORY: Heart Cath with pericardicentesis, Rt THA 2022, Rt TKA 01/09/22, Lt TKA 2018, Hx of Breast Cancer s/p double mastectomy with sentinel lymph node removed from right side   PAIN:  Are you having pain? Yes: NPRS scale: 5/10 Pain location: right heel Pain description: aching,  sore Aggravating factors: exercise Relieving factors: rest  PRECAUTIONS: Other: bilat mastectomy  RED FLAGS: None   WEIGHT BEARING RESTRICTIONS: No  FALLS:  Has patient fallen in last 6 months? No  LIVING ENVIRONMENT: Lives with: lives alone Lives in: House/apartment Stairs: Yes: Internal: 1 flight steps; can reach both Has following equipment at home: Single point cane and Walker - 2 wheeled  OCCUPATION: Working part-time at [employer]  PLOF: Independent and Leisure: traveling, exercise, reading, gardening, walking her dog (standard poodle)  PATIENT GOALS: Wants to be able to play tennis again and travel without increased pain.  NEXT MD VISIT: 09/06/2023 with Dr Vernetta  OBJECTIVE:  Note: Objective measures were completed at Evaluation unless otherwise noted.  DIAGNOSTIC FINDINGS: Radiographs of knee reveal well placed joint  PATIENT  SURVEYS:  Eval: Lower Extremity Functional Scale:  56 / 80 = 70.0 %    COGNITION: Overall cognitive status: Within functional limits for tasks assessed     SENSATION: Patient denies numbness and tingling  EDEMA:  Patient states that sometimes she notices right knee edema  MUSCLE LENGTH: Hamstrings: right hamstring with some increased tightness over left  POSTURE: rounded shoulders  PALPATION: Some tenderness to palpation along right knee  LOWER EXTREMITY ROM:  Active ROM Right eval Left eval  Knee flexion 0 0  Knee extension 127 with pain 132   (Blank rows = not tested)  LOWER EXTREMITY MMT:  Eval: Right hip strength 4/5 Right quad strength 4+/5 Right hamstring strength 4/5 Left LE strength is WFL  FUNCTIONAL TESTS:  Eval: 5 times sit to stand: 12.34 sec without UE use (patient bearing most of her weight through LLE) 6 minute walk test: 1,396 ft with knee pain 3/10 (Age related norm is 1,713 ft)  GAIT: Distance walked: >1000 ft Assistive device utilized: None Level of assistance: Complete Independence Comments: with increased right knee pain                                                                                                                                TREATMENT DATE:  08/03/2023: Nustep level 5 x5 min with PT present to discuss status Seated hamstring stretch 2x20 sec bilat Seated LAQ with 3# ankle weights 2x10 bilat Standing at barre with 3# ankle weights:  high marching, hip abduction, hip extension, hamstring curls.  2x10 each bilat Seated rocker board DF/PF x1 min (did in seated position secondary to increased pain today) Mini squats at barre 2x10 Standing single leg 3 way clock tap x10 bilat   07/28/2023: Reviewed HEP and provided with handout for exercises    PATIENT EDUCATION:  Education details: Issued HEP Person educated: Patient Education method: Explanation, Facilities manager, and Handouts Education comprehension: verbalized  understanding  HOME EXERCISE PROGRAM: Access Code: [email or web address] URL: [email or web address] Date: 07/28/2023 Prepared by: Jarrell Laming  Exercises - Active Straight Leg Raise with Quad Set  - 1 x daily - 7 x weekly - 2 sets -  10 reps - Seated Long Arc Quad  - 3 x daily - 7 x weekly - 1 sets - 10 reps - 5 hold - Standing Marching  - 1-2 x daily - 7 x weekly - 1 sets - 10 reps - Standing Knee Flexion AROM with Chair Support  - 1 x daily - 7 x weekly - 2 sets - 10 reps - Standing Hip Abduction with Counter Support  - 1 x daily - 7 x weekly - 2 sets - 10 reps - Standing Hamstring Stretch with Step  - 1 x daily - 7 x weekly - 1 sets - 2 reps - 20 sec hold - Mini Squat with Counter Support  - 1 x daily - 7 x weekly - 2 sets - 10 reps  ASSESSMENT:  CLINICAL IMPRESSION: Ms Fisch presents to skilled PT for first follow up visit after initial evaluation.  Patient does admit that she was not as compliant with her HEP as she was hoping while she was out of town.  However, patient does state that her knee is feeling better, but she is having some right heel pain that is new.  Patient able to progress with session with increased flexibility.  Patient with great motivation throughout session.  Patient continues to require skilled PT to progress towards goal related activities.   OBJECTIVE IMPAIRMENTS: decreased activity tolerance, decreased balance, difficulty walking, decreased ROM, decreased strength, impaired flexibility, postural dysfunction, and pain.   ACTIVITY LIMITATIONS: squatting, stairs, and locomotion level  PARTICIPATION LIMITATIONS: community activity, occupation, and yard work  PERSONAL FACTORS: Time since onset of injury/illness/exacerbation and 3+ comorbidities: Hx of breast cancer with bilateral mastectomy (recent implant removal), heart cath with pericardicentesis, bilateral TKA, right THA are also affecting patient's functional outcome.   REHAB POTENTIAL:  Good  CLINICAL DECISION MAKING: Evolving/moderate complexity  EVALUATION COMPLEXITY: Moderate   GOALS: Goals reviewed with patient? Yes  SHORT TERM GOALS: Target date: 08/20/2023 Pt will be independent with initial HEP to improve ROM and mobility.  Baseline: Goal status: Ongoing  2.  Patient will increase right knee A/ROM to [facility] without pain at end-range Baseline:  Goal status: Ongoing   LONG TERM GOALS: Target date: 09/17/2023  Patient will be independent with advanced HEP to allow for self progression after discharge. Baseline:  Goal status: INITIAL  2.  Patient will improve score of Lower Extremity Functional Scale to at least 80% to demonstrate improvements in functional tasks. Baseline: 70.0% Goal status: INITIAL  3.  Patient will improve 6 minute walk test to greater than 1,700 ft to place her at her age related norms. Baseline: 1,396 ft with pain of 3/10 in right knee Goal status: INITIAL  4.  Patient will increase right knee/hip strength to [facility] to allow for patient to safely return to playing tennis/performing tennis drills. Baseline:  Goal status: INITIAL  5.  Patient will report ability to walk her dog for desired length of time without increased knee pain or loss of balance. Baseline:  Goal status: INITIAL    PLAN:  PT FREQUENCY: 1-2x/week  PT DURATION: 8 weeks  PLANNED INTERVENTIONS: [id number]- PT Re-evaluation, 97750- Physical Performance Testing, 97110-Therapeutic exercises, 97530- Therapeutic activity, [id number]- Neuromuscular re-education, 97535- Self Care, [id number]- Manual therapy, [id number]- Gait training, [phone]- Canalith repositioning, [id number]- Aquatic Therapy, [id number]- Electrical stimulation (unattended), [phone]- Electrical stimulation (manual), [id number]- Vasopneumatic device, [id number]- Ultrasound, [id number]- Ionotophoresis 4mg /ml Dexamethasone , [id number] (1-2 muscles), [id number] (3+ muscles)- Dry Needling, Patient/Family education, Balance training, Stair training, Taping, Joint  mobilization, Joint  manipulation, Scar mobilization, Vestibular training, Cryotherapy, and Moist heat  PLAN FOR NEXT SESSION: Assess and progress HEP as indicated, strengthening, flexibility, manual/dry needling as indicated    Jarrell Laming, PT, DPT 08/03/23, 8:46 AM  [facility] [address], [address] [place], [place] [address] Phone # [phone] Fax [phone]

## 2023-08-17 ENCOUNTER — Ambulatory Visit: Admitting: Rehabilitative and Restorative Service Providers"

## 2023-08-17 ENCOUNTER — Encounter: Payer: Self-pay | Admitting: Rehabilitative and Restorative Service Providers"

## 2023-08-17 DIAGNOSIS — M25561 Pain in right knee: Secondary | ICD-10-CM | POA: Diagnosis not present

## 2023-08-17 DIAGNOSIS — R2681 Unsteadiness on feet: Secondary | ICD-10-CM

## 2023-08-17 DIAGNOSIS — R279 Unspecified lack of coordination: Secondary | ICD-10-CM

## 2023-08-17 DIAGNOSIS — M6281 Muscle weakness (generalized): Secondary | ICD-10-CM

## 2023-08-17 DIAGNOSIS — R600 Localized edema: Secondary | ICD-10-CM

## 2023-08-17 NOTE — Therapy (Signed)
 OUTPATIENT PHYSICAL THERAPY TREATMENT NOTE   Patient Name: Sierra Perez MRN: [id number] DOB:11/27/1960, 63 y.o., female Today's Date: 08/17/2023  END OF SESSION:  PT End of Session - 08/17/23 0738     Visit Number 3    Date for PT Re-Evaluation 09/17/23    Authorization Type [employer] - no auth required    Progress Note Due on Visit 10    PT Start Time 0732    PT Stop Time 0818    PT Time Calculation (min) 46 min    Activity Tolerance Patient tolerated treatment well    Behavior During Therapy WFL for tasks assessed/performed          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, her-2 positive, ER/PR negative   Clotting disorder (HCC)    testing was negative, h/o DVT while on treatment   Colon polyps    Complex regional pain syndrome i of right lower limb    RDS   Complication of anesthesia    Cystitis    Diabetes mellitus without complication (HCC)    DVT (deep venous thrombosis) (HCC)    started in 2015, multiple   Dysrhythmia    Family history of breast cancer    Family history of colon cancer    Fibroid    Fibromyalgia    GERD (gastroesophageal reflux disease)    Heart murmur    HLD (hyperlipidemia)    HPV in female    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   Hypertension    Hypothyroidism    IBS (irritable bowel syndrome)    Mitral valve prolapse    Peptic ulcer    Personal history of malignant neoplasm of breast    Sleep apnea treated with continuous positive airway pressure (CPAP)    Past Surgical History:  Procedure Laterality Date   ABDOMINAL HYSTERECTOMY  2010   fibroids, h/o abnormal pap smears   AXILLARY SENTINEL NODE BIOPSY Right 07/08/2020   Procedure: RIGHT AXILLARY SENTINEL NODE BIOPSY;  Surgeon: Ebbie Cough, MD;  Location: [facility] OR;  Service: General;  Laterality: Right;   BREAST IMPLANT REMOVAL Right 09/21/2022   Procedure: REMOVAL  BREAST IMPLANTS;  Surgeon: Lowery Estefana RAMAN, DO;  Location: [facility];  Service: Plastics;  Laterality: Right;   BREAST RECONSTRUCTION WITH PLACEMENT OF TISSUE EXPANDER AND FLEX HD (ACELLULAR HYDRATED DERMIS) Bilateral 07/08/2020   Procedure: BILATERAL BREAST RECONSTRUCTION WITH  FLEX HD (ACELLULAR HYDRATED DERMIS),DIRECT IMPLANT RECONSTRUCTION;  Surgeon: Elisabeth Craig RAMAN, MD;  Location: [facility] OR;  Service: Plastics;  Laterality: Bilateral;   BREAST SURGERY Right 2015   right lumpectomy with reduction and lift   COLONOSCOPY     DENTAL SURGERY     ESOPHAGOGASTRODUODENOSCOPY     LAPAROSCOPIC GASTRIC SLEEVE RESECTION  2014   LAPAROSCOPIC OOPHERECTOMY Bilateral 2016   LEFT HEART CATH AND CORONARY ANGIOGRAPHY N/A 07/18/2022   Procedure: LEFT HEART CATH AND CORONARY ANGIOGRAPHY;  Surgeon: Anner Alm ORN, MD;  Location: [facility] INVASIVE CV LAB;  Service: Cardiovascular;  Laterality: N/A;   PERICARDIOCENTESIS N/A 07/20/2022   Procedure: PERICARDIOCENTESIS;  Surgeon: Anner Alm ORN, MD;  Location: [facility]VAS[facility] CV LAB;  Service: Cardiovascular;  Laterality: N/A;   REPLACEMENT TOTAL KNEE Left 2018   also had 3 prior arthroscopies   TOE SURGERY Right    dislocated toe  TOTAL HIP ARTHROPLASTY Right 11/19/2020   Procedure: RIGHT TOTAL HIP ARTHROPLASTY ANTERIOR APPROACH;  Surgeon: Vernetta Lonni GRADE, MD;  Location: [facility] OR;  Service: Orthopedics;  Laterality: Right;   TOTAL KNEE ARTHROPLASTY Right 01/09/2022   Procedure: RIGHT TOTAL KNEE ARTHROPLASTY;  Surgeon: Vernetta Lonni GRADE, MD;  Location: [facility];  Service: Orthopedics;  Laterality: Right;   TOTAL MASTECTOMY Bilateral 07/08/2020   Procedure: BILATERAL TOTAL MASTECTOMY;  Surgeon: Ebbie Cough, MD;  Location: [facility] OR;  Service: General;  Laterality: Bilateral;   VENA CAVA FILTER PLACEMENT  2017   Patient Active Problem List   Diagnosis Date Noted   Hyperlipidemia associated with type 2 diabetes mellitus (HCC) 05/05/2023    Essential hypertension => INCORRECT 05/05/2023   Breast cancer (HCC) 09/21/2022   Acquired absence of breast and absent nipple 08/24/2022   PAF (paroxysmal atrial fibrillation) (HCC) 07/22/2022   Pericardial effusion 07/19/2022   ST elevation 07/18/2022   Breast pain 07/18/2022   Pericarditis 07/18/2022   Nail dystrophy 05/05/2022   Status post total right knee replacement 01/09/2022   Iron  deficiency anemia 01/23/2021   Status post right hip replacement 12/03/2020   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   H/O total hysterectomy 06/11/2020   Chronic superficial gastritis 05/27/2020   Fibromyalgia 05/27/2020   GERD (gastroesophageal reflux disease) 05/27/2020   Complex regional pain syndrome I, unspecified 05/27/2020   Atypical lobular hyperplasia [facility]) of left breast 05/23/2020   History of therapeutic radiation 05/23/2020   Family history of breast cancer    Family history of colon cancer    Unilateral primary osteoarthritis, right knee 02/26/2020   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   DVT (deep venous thrombosis) (HCC) 05/04/2018   Malignant neoplasm of upper-outer quadrant of right breast in female, estrogen receptor negative (HCC) 05/03/2018   Obstructive sleep apnea (adult) (pediatric) 09/14/2017   Adenopathy 07/15/2017   B12 deficiency 07/15/2017   Disorder of tympanic membrane of right ear 07/15/2017   Recurrent UTI 03/09/2017   Proteinuria 02/25/2017   At risk for obstructive sleep apnea 07/01/2016   Suspected sleep apnea 07/01/2016   Arthritis of left knee 05/26/2016   Localized primary osteoarthritis of lower leg, left 05/26/2016   Clotting disorder (HCC) 05/22/2016   Chronic anticoagulation 01/06/2016   Pelvic and perineal pain 11/28/2013   Anxiety and depression 11/16/2013   H/O bariatric surgery 11/16/2013   Atrophic vaginitis 11/13/2013   Tear of lateral cartilage or meniscus of knee, current 04/14/2013   Allergic rhinitis 01/16/2013   Symptomatic menopausal or female  climacteric states 11/10/2012   Disequilibrium 06/09/2012   Vitamin D  deficiency 09/13/2008   Preop examination 04/20/2008   Hypothyroidism 05/20/2007    PCP: Ransom Other, MD  REFERRING PROVIDER: Gretta Bertrum ORN, PA-C  REFERRING DIAG: [phone] (ICD-10-CM) - Status post total right knee replacement M76.31 (ICD-10-CM) - It band syndrome, right  THERAPY DIAG:  Right knee pain, unspecified chronicity  Muscle weakness (generalized)  Unspecified lack of coordination  Localized edema  Unsteadiness on feet  Rationale for Evaluation and Treatment: Rehabilitation  ONSET DATE: R TKA 01/09/2022  SUBJECTIVE:   SUBJECTIVE STATEMENT: Patient states that she purchased 3# ankle weights and was doing exercises, then decided to walk around her home for prolonged time and that she then started having increased pain as a resultant.  Patient reports pain was a 7-8/10, but is now back down to a 5/10.  PERTINENT HISTORY: Heart Cath with pericardicentesis, Rt THA 2022, Rt TKA 01/09/22, Lt TKA 2018, Hx of Breast Cancer  s/p double mastectomy with sentinel lymph node removed from right side   PAIN:  Are you having pain? Yes: NPRS scale: 5-7/10 Pain location: right knee Pain description: aching, sore Aggravating factors: exercise Relieving factors: rest  PRECAUTIONS: Other: bilat mastectomy  RED FLAGS: None   WEIGHT BEARING RESTRICTIONS: No  FALLS:  Has patient fallen in last 6 months? No  LIVING ENVIRONMENT: Lives with: lives alone Lives in: House/apartment Stairs: Yes: Internal: 1 flight steps; can reach both Has following equipment at home: Single point cane and Walker - 2 wheeled  OCCUPATION: Working part-time at [employer]  PLOF: Independent and Leisure: traveling, exercise, reading, gardening, walking her dog (standard poodle)  PATIENT GOALS: Wants to be able to play tennis again and travel without increased pain.  NEXT MD VISIT: 09/06/2023 with Dr Vernetta  OBJECTIVE:   Note: Objective measures were completed at Evaluation unless otherwise noted.  DIAGNOSTIC FINDINGS: Radiographs of knee reveal well placed joint  PATIENT SURVEYS:  Eval: Lower Extremity Functional Scale:  56 / 80 = 70.0 %    COGNITION: Overall cognitive status: Within functional limits for tasks assessed     SENSATION: Patient denies numbness and tingling  EDEMA:  Patient states that sometimes she notices right knee edema  MUSCLE LENGTH: Hamstrings: right hamstring with some increased tightness over left  POSTURE: rounded shoulders  PALPATION: Some tenderness to palpation along right knee  LOWER EXTREMITY ROM:  Active ROM Right eval Left eval  Knee flexion 0 0  Knee extension 127 with pain 132   (Blank rows = not tested)  LOWER EXTREMITY MMT:  Eval: Right hip strength 4/5 Right quad strength 4+/5 Right hamstring strength 4/5 Left LE strength is WFL  FUNCTIONAL TESTS:  Eval: 5 times sit to stand: 12.34 sec without UE use (patient bearing most of her weight through LLE) 6 minute walk test: 1,396 ft with knee pain 3/10 (Age related norm is 1,713 ft)  GAIT: Distance walked: >1000 ft Assistive device utilized: None Level of assistance: Complete Independence Comments: with increased right knee pain                                                                                                                                TREATMENT DATE:  08/17/2023: Recumbent bike level 1.0 x4 min with PT present to discuss status Seated hamstring stretch 2x20 sec bilat Seated LAQ 2x10 bilat with 3 sec hold (did not utilize weight secondary to patient with increased pain) Sit to/from stand x10 Supine with 2# ankle weights:  straight leg raise, SAQ.  2x10 each bilat Vasocompression:  Game ready to right knee with medium compression, 3 snowflakes, x10 min   08/03/2023: Nustep level 5 x5 min with PT present to discuss status Seated hamstring stretch 2x20 sec bilat Seated  LAQ with 3# ankle weights 2x10 bilat Standing at barre with 3# ankle weights:  high marching, hip abduction, hip extension, hamstring curls.  2x10 each  bilat Seated rocker board DF/PF x1 min (did in seated position secondary to increased pain today) Mini squats at barre 2x10 Standing single leg 3 way clock tap x10 bilat   07/28/2023: Reviewed HEP and provided with handout for exercises    PATIENT EDUCATION:  Education details: Issued HEP Person educated: Patient Education method: Explanation, Demonstration, and Handouts Education comprehension: verbalized understanding  HOME EXERCISE PROGRAM: Access Code: [email or web address] URL: [email or web address] Date: 07/28/2023 Prepared by: Jarrell Laming  Exercises - Active Straight Leg Raise with Quad Set  - 1 x daily - 7 x weekly - 2 sets - 10 reps - Seated Long Arc Quad  - 3 x daily - 7 x weekly - 1 sets - 10 reps - 5 hold - Standing Marching  - 1-2 x daily - 7 x weekly - 1 sets - 10 reps - Standing Knee Flexion AROM with Chair Support  - 1 x daily - 7 x weekly - 2 sets - 10 reps - Standing Hip Abduction with Counter Support  - 1 x daily - 7 x weekly - 2 sets - 10 reps - Standing Hamstring Stretch with Step  - 1 x daily - 7 x weekly - 1 sets - 2 reps - 20 sec hold - Mini Squat with Counter Support  - 1 x daily - 7 x weekly - 2 sets - 10 reps  ASSESSMENT:  CLINICAL IMPRESSION: Ms Tokarz presents to skilled PT stating that she did really well after her last PT session and she purchased 3# ankle weights.  She admits that she put them on and did her exercises, but then did not take them off and walked around her house and likely overdid it and is now having increased pain.  States that her pain is now starting to feel better, but she is still having pain with ROM.  Patient able to progress with session, but without or with less weight.  Patient educated about not overdoing it and just completing the exercises without weight until her knee  starts to feel better.  Patient verbalizes understanding.  Patient with Game Ready at end of session secondary to recent increase in pain and edema.  Patient verbalizes decreased pain with use of vasocompression.   OBJECTIVE IMPAIRMENTS: decreased activity tolerance, decreased balance, difficulty walking, decreased ROM, decreased strength, impaired flexibility, postural dysfunction, and pain.   ACTIVITY LIMITATIONS: squatting, stairs, and locomotion level  PARTICIPATION LIMITATIONS: community activity, occupation, and yard work  PERSONAL FACTORS: Time since onset of injury/illness/exacerbation and 3+ comorbidities: Hx of breast cancer with bilateral mastectomy (recent implant removal), heart cath with pericardicentesis, bilateral TKA, right THA are also affecting patient's functional outcome.   REHAB POTENTIAL: Good  CLINICAL DECISION MAKING: Evolving/moderate complexity  EVALUATION COMPLEXITY: Moderate   GOALS: Goals reviewed with patient? Yes  SHORT TERM GOALS: Target date: 08/20/2023 Pt will be independent with initial HEP to improve ROM and mobility.  Baseline: Goal status: Ongoing  2.  Patient will increase right knee A/ROM to [facility] without pain at end-range Baseline:  Goal status: Ongoing   LONG TERM GOALS: Target date: 09/17/2023  Patient will be independent with advanced HEP to allow for self progression after discharge. Baseline:  Goal status: INITIAL  2.  Patient will improve score of Lower Extremity Functional Scale to at least 80% to demonstrate improvements in functional tasks. Baseline: 70.0% Goal status: INITIAL  3.  Patient will improve 6 minute walk test to greater than 1,700 ft to place her at her age related  norms. Baseline: 1,396 ft with pain of 3/10 in right knee Goal status: INITIAL  4.  Patient will increase right knee/hip strength to [facility] to allow for patient to safely return to playing tennis/performing tennis drills. Baseline:  Goal status:  INITIAL  5.  Patient will report ability to walk her dog for desired length of time without increased knee pain or loss of balance. Baseline:  Goal status: INITIAL    PLAN:  PT FREQUENCY: 1-2x/week  PT DURATION: 8 weeks  PLANNED INTERVENTIONS: [id number]- PT Re-evaluation, 97750- Physical Performance Testing, 97110-Therapeutic exercises, 97530- Therapeutic activity, [id number]- Neuromuscular re-education, 97535- Self Care, [id number]- Manual therapy, [id number]- Gait training, [phone]- Canalith repositioning, [id number]- Aquatic Therapy, [phone]- Electrical stimulation (unattended), [phone]- Electrical stimulation (manual), [id number]- Vasopneumatic device, [id number]- Ultrasound, [id number]- Ionotophoresis 4mg /ml Dexamethasone , 79439 (1-2 muscles), [id number] (3+ muscles)- Dry Needling, Patient/Family education, Balance training, Stair training, Taping, Joint mobilization, Joint manipulation, Scar mobilization, Vestibular training, Cryotherapy, and Moist heat  PLAN FOR NEXT SESSION: Assess and progress HEP as indicated, strengthening, flexibility, manual/dry needling as indicated, Game ready if pain still continues.   Jarrell Laming, PT, DPT 08/17/23, 8:16 AM  [facility] [address], [address] [place], [place] [address] Phone # [phone] Fax [phone]

## 2023-08-19 ENCOUNTER — Other Ambulatory Visit: Payer: Self-pay | Admitting: Student

## 2023-08-19 DIAGNOSIS — Z9013 Acquired absence of bilateral breasts and nipples: Secondary | ICD-10-CM

## 2023-08-19 NOTE — Progress Notes (Signed)
 Updated MRI orders requested by Dr. Lowery and radiology team

## 2023-08-20 ENCOUNTER — Ambulatory Visit: Admitting: Rehabilitative and Restorative Service Providers"

## 2023-08-20 ENCOUNTER — Encounter: Payer: Self-pay | Admitting: Hematology and Oncology

## 2023-08-20 ENCOUNTER — Encounter: Payer: Self-pay | Admitting: Rehabilitative and Restorative Service Providers"

## 2023-08-20 DIAGNOSIS — R2681 Unsteadiness on feet: Secondary | ICD-10-CM

## 2023-08-20 DIAGNOSIS — M6281 Muscle weakness (generalized): Secondary | ICD-10-CM

## 2023-08-20 DIAGNOSIS — M25561 Pain in right knee: Secondary | ICD-10-CM

## 2023-08-20 DIAGNOSIS — R279 Unspecified lack of coordination: Secondary | ICD-10-CM

## 2023-08-20 DIAGNOSIS — R600 Localized edema: Secondary | ICD-10-CM

## 2023-08-20 DIAGNOSIS — R293 Abnormal posture: Secondary | ICD-10-CM

## 2023-08-20 NOTE — Therapy (Signed)
 OUTPATIENT PHYSICAL THERAPY TREATMENT NOTE   Patient Name: Sierra Perez MRN: [id number] DOB:1960-11-04, 63 y.o., female Today's Date: 08/20/2023  END OF SESSION:  PT End of Session - 08/20/23 1129     Visit Number 4    Number of Visits 5    Date for PT Re-Evaluation 09/17/23    Authorization Type [employer] - no auth required    Authorization Time Period --    Authorization - Visit Number 4    Authorization - Number of Visits 23    Progress Note Due on Visit 10    PT Start Time 0845    PT Stop Time 0930    PT Time Calculation (min) 45 min    Activity Tolerance Patient tolerated treatment well    Behavior During Therapy WFL for tasks assessed/performed           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, her-2 positive, ER/PR negative   Clotting disorder (HCC)    testing was negative, h/o DVT while on treatment   Colon polyps    Complex regional pain syndrome i of right lower limb    RDS   Complication of anesthesia    Cystitis    Diabetes mellitus without complication (HCC)    DVT (deep venous thrombosis) (HCC)    started in 2015, multiple   Dysrhythmia    Family history of breast cancer    Family history of colon cancer    Fibroid    Fibromyalgia    GERD (gastroesophageal reflux disease)    Heart murmur    HLD (hyperlipidemia)    HPV in female    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   Hypertension    Hypothyroidism    IBS (irritable bowel syndrome)    Mitral valve prolapse    Peptic ulcer    Personal history of malignant neoplasm of breast    Sleep apnea treated with continuous positive airway pressure (CPAP)    Past Surgical History:  Procedure Laterality Date   ABDOMINAL HYSTERECTOMY  2010   fibroids, h/o abnormal pap smears   AXILLARY SENTINEL NODE BIOPSY Right 07/08/2020   Procedure: RIGHT AXILLARY SENTINEL NODE BIOPSY;  Surgeon: Ebbie Cough, MD;  Location: [facility];  Service: General;  Laterality: Right;   BREAST IMPLANT REMOVAL Right 09/21/2022   Procedure: REMOVAL BREAST IMPLANTS;  Surgeon: Lowery Estefana RAMAN, DO;  Location: [facility];  Service: Plastics;  Laterality: Right;   BREAST RECONSTRUCTION WITH PLACEMENT OF TISSUE EXPANDER AND FLEX HD (ACELLULAR HYDRATED DERMIS) Bilateral 07/08/2020   Procedure: BILATERAL BREAST RECONSTRUCTION WITH  FLEX HD (ACELLULAR HYDRATED DERMIS),DIRECT IMPLANT RECONSTRUCTION;  Surgeon: Elisabeth Craig RAMAN, MD;  Location: [facility] OR;  Service: Plastics;  Laterality: Bilateral;   BREAST SURGERY Right 2015   right lumpectomy with reduction and lift   COLONOSCOPY     DENTAL SURGERY     ESOPHAGOGASTRODUODENOSCOPY     LAPAROSCOPIC GASTRIC SLEEVE RESECTION  2014   LAPAROSCOPIC OOPHERECTOMY Bilateral 2016   LEFT HEART CATH AND CORONARY ANGIOGRAPHY N/A 07/18/2022   Procedure: LEFT HEART CATH AND CORONARY ANGIOGRAPHY;  Surgeon: Anner Alm ORN, MD;  Location: [facility] INVASIVE CV LAB;  Service: Cardiovascular;  Laterality: N/A;   PERICARDIOCENTESIS N/A 07/20/2022   Procedure: PERICARDIOCENTESIS;  Surgeon: Anner Alm ORN, MD;  Location: [facility] INVASIVE CV LAB;  Service: Cardiovascular;  Laterality: N/A;   REPLACEMENT TOTAL KNEE Left 2018   also had 3 prior arthroscopies   TOE SURGERY Right    dislocated toe   TOTAL HIP ARTHROPLASTY Right 11/19/2020   Procedure: RIGHT TOTAL HIP ARTHROPLASTY ANTERIOR APPROACH;  Surgeon: Vernetta Lonni GRADE, MD;  Location: [facility] OR;  Service: Orthopedics;  Laterality: Right;   TOTAL KNEE ARTHROPLASTY Right 01/09/2022   Procedure: RIGHT TOTAL KNEE ARTHROPLASTY;  Surgeon: Vernetta Lonni GRADE, MD;  Location: [facility];  Service: Orthopedics;  Laterality: Right;   TOTAL MASTECTOMY Bilateral 07/08/2020   Procedure: BILATERAL TOTAL MASTECTOMY;  Surgeon: Ebbie Cough, MD;  Location: [facility] OR;  Service: General;  Laterality: Bilateral;   VENA CAVA FILTER PLACEMENT  2017    Patient Active Problem List   Diagnosis Date Noted   Hyperlipidemia associated with type 2 diabetes mellitus (HCC) 05/05/2023   Essential hypertension => INCORRECT 05/05/2023   Breast cancer (HCC) 09/21/2022   Acquired absence of breast and absent nipple 08/24/2022   PAF (paroxysmal atrial fibrillation) (HCC) 07/22/2022   Pericardial effusion 07/19/2022   ST elevation 07/18/2022   Breast pain 07/18/2022   Pericarditis 07/18/2022   Nail dystrophy 05/05/2022   Status post total right knee replacement 01/09/2022   Iron  deficiency anemia 01/23/2021   Status post right hip replacement 12/03/2020   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   H/O total hysterectomy 06/11/2020   Chronic superficial gastritis 05/27/2020   Fibromyalgia 05/27/2020   GERD (gastroesophageal reflux disease) 05/27/2020   Complex regional pain syndrome I, unspecified 05/27/2020   Atypical lobular hyperplasia (ALH) of left breast 05/23/2020   History of therapeutic radiation 05/23/2020   Family history of breast cancer    Family history of colon cancer    Unilateral primary osteoarthritis, right knee 02/26/2020   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   DVT (deep venous thrombosis) (HCC) 05/04/2018   Malignant neoplasm of upper-outer quadrant of right breast in female, estrogen receptor negative (HCC) 05/03/2018   Obstructive sleep apnea (adult) (pediatric) 09/14/2017   Adenopathy 07/15/2017   B12 deficiency 07/15/2017   Disorder of tympanic membrane of right ear 07/15/2017   Recurrent UTI 03/09/2017   Proteinuria 02/25/2017   At risk for obstructive sleep apnea 07/01/2016   Suspected sleep apnea 07/01/2016   Arthritis of left knee 05/26/2016   Localized primary osteoarthritis of lower leg, left 05/26/2016   Clotting disorder (HCC) 05/22/2016   Chronic anticoagulation 01/06/2016   Pelvic and perineal pain 11/28/2013   Anxiety and depression 11/16/2013   H/O bariatric surgery 11/16/2013   Atrophic vaginitis 11/13/2013    Tear of lateral cartilage or meniscus of knee, current 04/14/2013   Allergic rhinitis 01/16/2013   Symptomatic menopausal or female climacteric states 11/10/2012   Disequilibrium 06/09/2012   Vitamin D  deficiency 09/13/2008   Preop examination 04/20/2008   Hypothyroidism 05/20/2007    PCP: Ransom Other, MD  REFERRING PROVIDER: Gretta Bertrum ORN, PA-C  REFERRING DIAG: [phone] (ICD-10-CM) - Status post total right knee replacement M76.31 (ICD-10-CM) - It band syndrome, right  THERAPY DIAG:  Right knee pain, unspecified chronicity  Muscle weakness (generalized)  Unspecified lack of coordination  Unsteadiness on feet  Localized edema  Abnormal posture  Rationale for Evaluation and Treatment: Rehabilitation  ONSET DATE: R TKA 01/09/2022  SUBJECTIVE:   SUBJECTIVE STATEMENT:  Patient states she is not in the similar knee pain she had at last session, and requests to have today's session with higher challenges than last. She was slightly overwhelmed with how crowded  the gym was.   PERTINENT HISTORY: Heart Cath with pericardicentesis, Rt THA 2022, Rt TKA 01/09/22, Lt TKA 2018, Hx of Breast Cancer s/p double mastectomy with sentinel lymph node removed from right side   PAIN:  Are you having pain? Yes: NPRS scale: 5/10 Pain location: right knee Pain description: aching, sore Aggravating factors: exercise Relieving factors: rest  PRECAUTIONS: Other: bilat mastectomy  RED FLAGS: None   WEIGHT BEARING RESTRICTIONS: No  FALLS:  Has patient fallen in last 6 months? No  LIVING ENVIRONMENT: Lives with: lives alone Lives in: House/apartment Stairs: Yes: Internal: 1 flight steps; can reach both Has following equipment at home: Single point cane and Walker - 2 wheeled  OCCUPATION: Working part-time at [employer]  PLOF: Independent and Leisure: traveling, exercise, reading, gardening, walking her dog (standard poodle)  PATIENT GOALS: Wants to be able to play tennis  again and travel without increased pain.  NEXT MD VISIT: 09/06/2023 with Dr Vernetta  OBJECTIVE:  Note: Objective measures were completed at Evaluation unless otherwise noted.  DIAGNOSTIC FINDINGS: Radiographs of knee reveal well placed joint  PATIENT SURVEYS:  Eval: Lower Extremity Functional Scale:  56 / 80 = 70.0 %    COGNITION: Overall cognitive status: Within functional limits for tasks assessed     SENSATION: Patient denies numbness and tingling  EDEMA:  Patient states that sometimes she notices right knee edema  MUSCLE LENGTH: Hamstrings: right hamstring with some increased tightness over left  POSTURE: rounded shoulders  PALPATION: Some tenderness to palpation along right knee  LOWER EXTREMITY ROM:  Active ROM Right eval Left eval  Knee flexion 0 0  Knee extension 127 with pain 132   (Blank rows = not tested)  LOWER EXTREMITY MMT:  Eval: Right hip strength 4/5 Right quad strength 4+/5 Right hamstring strength 4/5 Left LE strength is WFL  FUNCTIONAL TESTS:  Eval: 5 times sit to stand: 12.34 sec without UE use (patient bearing most of her weight through LLE) 6 minute walk test: 1,396 ft with knee pain 3/10 (Age related norm is 1,713 ft)  GAIT: Distance walked: >1000 ft Assistive device utilized: None Level of assistance: Complete Independence Comments: with increased right knee pain                                                                                                                                TREATMENT DATE:   08/20/2023 Seated hamstring stretch 2x20 sec bil Seated quad set pressing Rt knee into towel 2x10 Side lying clamshells yellow band 2x10 each side PPT in supine 2x8 Glute bridge in supine 2x10 Standing hamstring curls bil 2.5# 2x10 Standing marches bil 2.5# 2x10  Standing calf raises bil at barre Active gastroc stretch 1x10 bil Education- positive mindset effects effective neuromuscular activation and engagement from  muscles. Discussed her other health factors that prevented her from recovering her knee how she wanted    08/17/2023: Recumbent bike level 1.0 x4  min with PT present to discuss status Seated hamstring stretch 2x20 sec bilat Seated LAQ 2x10 bilat with 3 sec hold (did not utilize weight secondary to patient with increased pain) Sit to/from stand x10 Supine with 2# ankle weights:  straight leg raise, SAQ.  2x10 each bilat Vasocompression:  Game ready to right knee with medium compression, 3 snowflakes, x10 min   08/03/2023: Nustep level 5 x5 min with PT present to discuss status Seated hamstring stretch 2x20 sec bilat Seated LAQ with 3# ankle weights 2x10 bilat Standing at barre with 3# ankle weights:  high marching, hip abduction, hip extension, hamstring curls.  2x10 each bilat Seated rocker board DF/PF x1 min (did in seated position secondary to increased pain today) Mini squats at barre 2x10 Standing single leg 3 way clock tap x10 bilat   PATIENT EDUCATION:  Education details: Issued HEP Person educated: Patient Education method: Explanation, Demonstration, and Handouts Education comprehension: verbalized understanding  HOME EXERCISE PROGRAM: Access Code: [email or web address] URL: [email or web address] Date: 08/20/2023 Prepared by: Jarrell Laming  Exercises - Active Straight Leg Raise with Quad Set  - 1 x daily - 7 x weekly - 2 sets - 10 reps - Supine Bridge  - 1 x daily - 7 x weekly - 2 sets - 8 reps - Clamshell with Resistance  - 1 x daily - 7 x weekly - 3 sets - 10 reps - Seated Long Arc Quad  - 3 x daily - 7 x weekly - 1 sets - 10 reps - 5 hold - Standing Marching  - 1-2 x daily - 7 x weekly - 1 sets - 10 reps - Standing Knee Flexion AROM with Chair Support  - 1 x daily - 7 x weekly - 2 sets - 10 reps - Standing Hip Abduction with Counter Support  - 1 x daily - 7 x weekly - 2 sets - 10 reps - Standing Hamstring Stretch with Step  - 1 x daily - 7 x weekly - 1 sets - 2 reps  - 20 sec hold - Mini Squat with Counter Support  - 1 x daily - 7 x weekly - 2 sets - 10 reps - Gastroc Stretch on Wall  - 1 x daily - 7 x weekly - 2 sets - 10 reps - Standing Heel Raise  - 1 x daily - 7 x weekly - 2 sets - 10 reps   ASSESSMENT:  CLINICAL IMPRESSION:  Ms Carmicheal presents to skilled PT with less pain than she was in at prior session, and states PT is working. She admits 'overdoing it' recently that later led to regression in her Rt knee. She repeatedly stated feeling weak in her muscles and wants to get back to her PLOF. Much of the session was discussing proper mindset toward knee recovery, including importance of neuromuscular engagement and proprioceptive awareness. Pt was appropriately challenged with a series of LE strengthening exercises, catered to pt's current level of function based via MMT at start of session. Pt continues to present with LE muscle weakness and decreased neuromuscular activation, thus would benefit from skilled PT to address these deficits and move toward pt goals.   OBJECTIVE IMPAIRMENTS: decreased activity tolerance, decreased balance, difficulty walking, decreased ROM, decreased strength, impaired flexibility, postural dysfunction, and pain  ACTIVITY LIMITATIONS: squatting, stairs, and locomotion level  PARTICIPATION LIMITATIONS: community activity, occupation, and yard work  PERSONAL FACTORS: Time since onset of injury/illness/exacerbation and 3+ comorbidities: Hx of breast cancer with bilateral mastectomy (recent implant removal), heart  cath with pericardicentesis, bilateral TKA, right THA are also affecting patient's functional outcome.   REHAB POTENTIAL: Good  CLINICAL DECISION MAKING: Evolving/moderate complexity  EVALUATION COMPLEXITY: Moderate   GOALS: Goals reviewed with patient? Yes  SHORT TERM GOALS: Target date: 08/20/2023 Pt will be independent with initial HEP to improve ROM and mobility.  Baseline: Goal status: Met on  08/20/23  2.  Patient will increase right knee A/ROM to [facility] without pain at end-range Baseline:  Goal status: Ongoing   LONG TERM GOALS: Target date: 09/17/2023  Patient will be independent with advanced HEP to allow for self progression after discharge. Baseline:  Goal status: Progressing   2.  Patient will improve score of Lower Extremity Functional Scale to at least 80% to demonstrate improvements in functional tasks. Baseline: 70.0% Goal status: INITIAL  3.  Patient will improve 6 minute walk test to greater than 1,700 ft to place her at her age related norms. Baseline: 1,396 ft with pain of 3/10 in right knee Goal status: INITIAL  4.  Patient will increase right knee/hip strength to [facility] to allow for patient to safely return to playing tennis/performing tennis drills. Baseline:  Goal status: Progressing   5.  Patient will report ability to walk her dog for desired length of time without increased knee pain or loss of balance. Baseline:  Goal status: INITIAL    PLAN:  PT FREQUENCY: 1-2x/week  PT DURATION: 8 weeks  PLANNED INTERVENTIONS: [id number]- PT Re-evaluation, 97750- Physical Performance Testing, 97110-Therapeutic exercises, 97530- Therapeutic activity, [id number]- Neuromuscular re-education, 97535- Self Care, [id number]- Manual therapy, [phone]- Gait training, [phone]- Canalith repositioning, [id number]- Aquatic Therapy, [phone]- Electrical stimulation (unattended), [phone]- Electrical stimulation (manual), [id number]- Vasopneumatic device, [id number]- Ultrasound, [id number]- Ionotophoresis 4mg /ml Dexamethasone , [id number] (1-2 muscles), [id number] (3+ muscles)- Dry Needling, Patient/Family education, Balance training, Stair training, Taping, Joint mobilization, Joint manipulation, Scar mobilization, Vestibular training, Cryotherapy, and Moist heat  PLAN FOR NEXT SESSION: Assess and progress HEP as indicated, strengthening, flexibility, manual/dry needling as indicated, Game ready if pain still continues, review positive  mindset toward knee recovery, Leg Press, measure knee A/ROM  Lavanda Cleverly, SPT 08/20/23 11:53 AM    I agree with the following treatment note after reviewing documentation. This session was performed under the supervision of a licensed clinician. Jarrell Laming, PT, DPT 08/20/23, 11:53 AM  [facility] [address] [place], [place] [address] Phone # [phone] Fax [phone]

## 2023-08-23 ENCOUNTER — Ambulatory Visit: Admitting: Rehabilitative and Restorative Service Providers"

## 2023-08-23 ENCOUNTER — Encounter: Payer: Self-pay | Admitting: Rehabilitative and Restorative Service Providers"

## 2023-08-23 DIAGNOSIS — R2681 Unsteadiness on feet: Secondary | ICD-10-CM

## 2023-08-23 DIAGNOSIS — R279 Unspecified lack of coordination: Secondary | ICD-10-CM

## 2023-08-23 DIAGNOSIS — R293 Abnormal posture: Secondary | ICD-10-CM

## 2023-08-23 DIAGNOSIS — M25561 Pain in right knee: Secondary | ICD-10-CM | POA: Diagnosis not present

## 2023-08-23 DIAGNOSIS — M6281 Muscle weakness (generalized): Secondary | ICD-10-CM

## 2023-08-23 DIAGNOSIS — R600 Localized edema: Secondary | ICD-10-CM

## 2023-08-23 NOTE — Therapy (Signed)
 OUTPATIENT PHYSICAL THERAPY TREATMENT NOTE   Patient Name: Sierra Perez MRN: [id number] DOB:[birth year], 63 y.o., female Today's Date: 08/23/2023  END OF SESSION:  PT End of Session - 08/23/23 1005     Visit Number 5    Number of Visits 5    Date for PT Re-Evaluation 09/17/23    Authorization Type [employer] - no auth required    Authorization - Number of Visits 23    Progress Note Due on Visit 10    PT Start Time 0845    PT Stop Time 0935    PT Time Calculation (min) 50 min    Activity Tolerance Patient tolerated treatment well    Behavior During Therapy WFL for tasks assessed/performed            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, her-2 positive, ER/PR negative   Clotting disorder (HCC)    testing was negative, h/o DVT while on treatment   Colon polyps    Complex regional pain syndrome i of right lower limb    RDS   Complication of anesthesia    Cystitis    Diabetes mellitus without complication (HCC)    DVT (deep venous thrombosis) (HCC)    started in 2015, multiple   Dysrhythmia    Family history of breast cancer    Family history of colon cancer    Fibroid    Fibromyalgia    GERD (gastroesophageal reflux disease)    Heart murmur    HLD (hyperlipidemia)    HPV in female    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   Hypertension    Hypothyroidism    IBS (irritable bowel syndrome)    Mitral valve prolapse    Peptic ulcer    Personal history of malignant neoplasm of breast    Sleep apnea treated with continuous positive airway pressure (CPAP)    Past Surgical History:  Procedure Laterality Date   ABDOMINAL HYSTERECTOMY  2010   fibroids, h/o abnormal pap smears   AXILLARY SENTINEL NODE BIOPSY Right 07/08/2020   Procedure: RIGHT AXILLARY SENTINEL NODE BIOPSY;  Surgeon: Ebbie Cough, MD;  Location: [facility] OR;  Service: General;  Laterality:  Right;   BREAST IMPLANT REMOVAL Right 09/21/2022   Procedure: REMOVAL BREAST IMPLANTS;  Surgeon: Lowery Estefana RAMAN, DO;  Location: [facility];  Service: Plastics;  Laterality: Right;   BREAST RECONSTRUCTION WITH PLACEMENT OF TISSUE EXPANDER AND FLEX HD (ACELLULAR HYDRATED DERMIS) Bilateral 07/08/2020   Procedure: BILATERAL BREAST RECONSTRUCTION WITH  FLEX HD (ACELLULAR HYDRATED DERMIS),DIRECT IMPLANT RECONSTRUCTION;  Surgeon: Elisabeth Craig RAMAN, MD;  Location: [facility] OR;  Service: Plastics;  Laterality: Bilateral;   BREAST SURGERY Right 2015   right lumpectomy with reduction and lift   COLONOSCOPY     DENTAL SURGERY     ESOPHAGOGASTRODUODENOSCOPY     LAPAROSCOPIC GASTRIC SLEEVE RESECTION  2014   LAPAROSCOPIC OOPHERECTOMY Bilateral 2016   LEFT HEART CATH AND CORONARY ANGIOGRAPHY N/A 07/18/2022   Procedure: LEFT HEART CATH AND CORONARY ANGIOGRAPHY;  Surgeon: Anner Alm ORN, MD;  Location: [facility] INVASIVE CV LAB;  Service: Cardiovascular;  Laterality: N/A;   PERICARDIOCENTESIS N/A 07/20/2022   Procedure: PERICARDIOCENTESIS;  Surgeon: Anner Alm ORN, MD;  Location: [facility] INVASIVE CV LAB;  Service: Cardiovascular;  Laterality: N/A;   REPLACEMENT TOTAL KNEE Left 2018  also had 3 prior arthroscopies   TOE SURGERY Right    dislocated toe   TOTAL HIP ARTHROPLASTY Right 11/19/2020   Procedure: RIGHT TOTAL HIP ARTHROPLASTY ANTERIOR APPROACH;  Surgeon: Vernetta Lonni GRADE, MD;  Location: [facility] OR;  Service: Orthopedics;  Laterality: Right;   TOTAL KNEE ARTHROPLASTY Right 01/09/2022   Procedure: RIGHT TOTAL KNEE ARTHROPLASTY;  Surgeon: Vernetta Lonni GRADE, MD;  Location: [facility];  Service: Orthopedics;  Laterality: Right;   TOTAL MASTECTOMY Bilateral 07/08/2020   Procedure: BILATERAL TOTAL MASTECTOMY;  Surgeon: Ebbie Cough, MD;  Location: [facility] OR;  Service: General;  Laterality: Bilateral;   VENA CAVA FILTER PLACEMENT  2017   Patient Active Problem List   Diagnosis Date Noted    Hyperlipidemia associated with type 2 diabetes mellitus (HCC) 05/05/2023   Essential hypertension => INCORRECT 05/05/2023   Breast cancer (HCC) 09/21/2022   Acquired absence of breast and absent nipple 08/24/2022   PAF (paroxysmal atrial fibrillation) (HCC) 07/22/2022   Pericardial effusion 07/19/2022   ST elevation 07/18/2022   Breast pain 07/18/2022   Pericarditis 07/18/2022   Nail dystrophy 05/05/2022   Status post total right knee replacement 01/09/2022   Iron  deficiency anemia 01/23/2021   Status post right hip replacement 12/03/2020   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   H/O total hysterectomy 06/11/2020   Chronic superficial gastritis 05/27/2020   Fibromyalgia 05/27/2020   GERD (gastroesophageal reflux disease) 05/27/2020   Complex regional pain syndrome I, unspecified 05/27/2020   Atypical lobular hyperplasia [facility]) of left breast 05/23/2020   History of therapeutic radiation 05/23/2020   Family history of breast cancer    Family history of colon cancer    Unilateral primary osteoarthritis, right knee 02/26/2020   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   DVT (deep venous thrombosis) (HCC) 05/04/2018   Malignant neoplasm of upper-outer quadrant of right breast in female, estrogen receptor negative (HCC) 05/03/2018   Obstructive sleep apnea (adult) (pediatric) 09/14/2017   Adenopathy 07/15/2017   B12 deficiency 07/15/2017   Disorder of tympanic membrane of right ear 07/15/2017   Recurrent UTI 03/09/2017   Proteinuria 02/25/2017   At risk for obstructive sleep apnea 07/01/2016   Suspected sleep apnea 07/01/2016   Arthritis of left knee 05/26/2016   Localized primary osteoarthritis of lower leg, left 05/26/2016   Clotting disorder (HCC) 05/22/2016   Chronic anticoagulation 01/06/2016   Pelvic and perineal pain 11/28/2013   Anxiety and depression 11/16/2013   H/O bariatric surgery 11/16/2013   Atrophic vaginitis 11/13/2013   Tear of lateral cartilage or meniscus of knee, current  04/14/2013   Allergic rhinitis 01/16/2013   Symptomatic menopausal or female climacteric states 11/10/2012   Disequilibrium 06/09/2012   Vitamin D  deficiency 09/13/2008   Preop examination 04/20/2008   Hypothyroidism 05/20/2007    PCP: Ransom Other, MD  REFERRING PROVIDER: Gretta Bertrum ORN, PA-C  REFERRING DIAG: [id number] (ICD-10-CM) - Status post total right knee replacement M76.31 (ICD-10-CM) - It band syndrome, right  THERAPY DIAG:  Right knee pain, unspecified chronicity  Muscle weakness (generalized)  Unspecified lack of coordination  Unsteadiness on feet  Localized edema  Abnormal posture  Rationale for Evaluation and Treatment: Rehabilitation  ONSET DATE: R TKA 01/09/2022  SUBJECTIVE:   SUBJECTIVE STATEMENT:  Pt presents to PT with no pain however states she will feel pain in the Rt hip and lateral aspect of knee intermittently throughout the week. Pt reports she feels weak and wants to gain muscle strength in LE.  PERTINENT HISTORY: Heart Cath with pericardicentesis, Rt  THA 2022, Rt TKA 01/09/22, Lt TKA 2018, Hx of Breast Cancer s/p double mastectomy with sentinel lymph node removed from right side   PAIN:  Are you having pain? Yes: NPRS scale: Currently 5/10 Pain location: right knee and right hip Pain description: aching, sore Aggravating factors: exercise Relieving factors: rest  PRECAUTIONS: Other: bilat mastectomy  RED FLAGS: None   WEIGHT BEARING RESTRICTIONS: No  FALLS:  Has patient fallen in last 6 months? No  LIVING ENVIRONMENT: Lives with: lives alone Lives in: House/apartment Stairs: Yes: Internal: 1 flight steps; can reach both Has following equipment at home: Single point cane and Walker - 2 wheeled  OCCUPATION: Working part-time at [employer]  PLOF: Independent and Leisure: traveling, exercise, reading, gardening, walking her dog (standard poodle)  PATIENT GOALS: Wants to be able to play tennis again and travel without increased  pain.  NEXT MD VISIT: 09/06/2023 with Dr Vernetta  OBJECTIVE:  Note: Objective measures were completed at Evaluation unless otherwise noted.  DIAGNOSTIC FINDINGS: Radiographs of knee reveal well placed joint  PATIENT SURVEYS:  Eval: Lower Extremity Functional Scale:  56 / 80 = 70.0 %    COGNITION: Overall cognitive status: Within functional limits for tasks assessed     SENSATION: Patient denies numbness and tingling  EDEMA:  Patient states that sometimes she notices right knee edema  MUSCLE LENGTH: Hamstrings: right hamstring with some increased tightness over left  POSTURE: rounded shoulders  PALPATION: Some tenderness to palpation along right knee  LOWER EXTREMITY ROM:  Active ROM Right eval Left eval  Knee flexion 0 0  Knee extension 127 with pain 132   (Blank rows = not tested)  LOWER EXTREMITY MMT:  Eval: Right hip strength 4/5 Right quad strength 4+/5 Right hamstring strength 4/5 Left LE strength is WFL  FUNCTIONAL TESTS:  Eval: 5 times sit to stand: 12.34 sec without UE use (patient bearing most of her weight through LLE) 6 minute walk test: 1,396 ft with knee pain 3/10 (Age related norm is 1,713 ft)  GAIT: Distance walked: >1000 ft Assistive device utilized: None Level of assistance: Complete Independence Comments: with increased right knee pain                                                                                                                                TREATMENT DATE:   08/23/23 Recombant bike level 3 for 5 min Hamstring stretch in seated 2x 30 sec Quad set into towel Rt leg 2x15 PPT 2x10 to improve lumbopelvic stability Glute bridges in supine 3x10 Side lying clamshells red band 2x 10 each side Calf raises in standing 2x 10 using mirror therapy to view muscle activation Calf raise/squeeze ball at ankles 2x 10 to target posterior tibialis muscle strengthening Leg Press 6 (seat) 50 lbs 1x10 changed to 80 lbs for increase  intensity Leg press 7 (seat) 80 lbs 3x 10 - instruct pt try pushing with heels so she  feels contraction in hamstring Standing abductor taps with 3 lbs ankle weights bil 2 x8  Manual therapy: palpation upon Rt lateral thigh and Rt knee due to pt reporting pain in her IT band insertion point IT band stretch in standing 1 x 20 sec  08/20/2023 Seated hamstring stretch 2x20 sec bil Seated quad set pressing Rt knee into towel 2x10 Side lying clamshells yellow band 2x10 each side PPT in supine 2x8 Glute bridge in supine 2x10 Standing hamstring curls bil 2.5# 2x10 Standing marches bil 2.5# 2x10  Standing calf raises bil at barre Active gastroc stretch 1x10 bil Education- positive mindset effects effective neuromuscular activation and engagement from muscles. Discussed her other health factors that prevented her from recovering her knee how she wanted    08/17/2023: Recumbent bike level 1.0 x4 min with PT present to discuss status Seated hamstring stretch 2x20 sec bilat Seated LAQ 2x10 bilat with 3 sec hold (did not utilize weight secondary to patient with increased pain) Sit to/from stand x10 Supine with 2# ankle weights:  straight leg raise, SAQ.  2x10 each bilat Vasocompression:  Game ready to right knee with medium compression, 3 snowflakes, x10 min   PATIENT EDUCATION:  Education details: Issued HEP Person educated: Patient Education method: Explanation, Facilities manager, and Handouts Education comprehension: verbalized understanding  HOME EXERCISE PROGRAM: Access Code: [email or web address] URL: [email or web address] Date: 08/20/2023 Prepared by: Jarrell Laming  Exercises - Active Straight Leg Raise with Quad Set  - 1 x daily - 7 x weekly - 2 sets - 10 reps - Supine Bridge  - 1 x daily - 7 x weekly - 2 sets - 8 reps - Clamshell with Resistance  - 1 x daily - 7 x weekly - 3 sets - 10 reps - Seated Long Arc Quad  - 3 x daily - 7 x weekly - 1 sets - 10 reps - 5 hold - Standing  Marching  - 1-2 x daily - 7 x weekly - 1 sets - 10 reps - Standing Knee Flexion AROM with Chair Support  - 1 x daily - 7 x weekly - 2 sets - 10 reps - Standing Hip Abduction with Counter Support  - 1 x daily - 7 x weekly - 2 sets - 10 reps - Standing Hamstring Stretch with Step  - 1 x daily - 7 x weekly - 1 sets - 2 reps - 20 sec hold - Mini Squat with Counter Support  - 1 x daily - 7 x weekly - 2 sets - 10 reps - Gastroc Stretch on Wall  - 1 x daily - 7 x weekly - 2 sets - 10 reps - Standing Heel Raise  - 1 x daily - 7 x weekly - 2 sets - 10 reps   ASSESSMENT:  CLINICAL IMPRESSION:   Ms Inglett presents to skilled PT with no reported pain upon arrival, however states she feels overall weakness in the LE and will occasionally feel lateral knee pain intermittently. Pt voiced concerns some of the exercises were too easy, thus discussion took place explaining importance of neuromuscular activation control needs to come before strengthening the muscle, and concern of pt overdoing exercises that cause set back again. Some exercises were modified due to pt feedback including leg press, pt moving from seat 6 to seat 7- furthering distance, and increasing weight from 70 lbs to 80 lbs that pt tolerated well. However, other exercises needed regression including standing abductor side kick outs with 3 lb weight needed to regress to  side step outs/taps. Pt would benefit from skilled PT to address the deficits below and to get back to her PLOF.   OBJECTIVE IMPAIRMENTS: decreased activity tolerance, decreased balance, difficulty walking, decreased ROM, decreased strength, impaired flexibility, postural dysfunction, and pain  ACTIVITY LIMITATIONS: squatting, stairs, and locomotion level  PARTICIPATION LIMITATIONS: community activity, occupation, and yard work  PERSONAL FACTORS: Time since onset of injury/illness/exacerbation and 3+ comorbidities: Hx of breast cancer with bilateral mastectomy (recent implant  removal), heart cath with pericardicentesis, bilateral TKA, right THA are also affecting patient's functional outcome.   REHAB POTENTIAL: Good  CLINICAL DECISION MAKING: Evolving/moderate complexity  EVALUATION COMPLEXITY: Moderate   GOALS: Goals reviewed with patient? Yes  SHORT TERM GOALS: Target date: 08/20/2023 Pt will be independent with initial HEP to improve ROM and mobility.  Baseline: Goal status: Met on 08/20/23  2.  Patient will increase right knee A/ROM to [facility] without pain at end-range Baseline:  Goal status: Ongoing   LONG TERM GOALS: Target date: 09/17/2023  Patient will be independent with advanced HEP to allow for self progression after discharge. Baseline:  Goal status: Progressing   2.  Patient will improve score of Lower Extremity Functional Scale to at least 80% to demonstrate improvements in functional tasks. Baseline: 70.0% Goal status: INITIAL  3.  Patient will improve 6 minute walk test to greater than 1,700 ft to place her at her age related norms. Baseline: 1,396 ft with pain of 3/10 in right knee Goal status: INITIAL  4.  Patient will increase right knee/hip strength to [facility] to allow for patient to safely return to playing tennis/performing tennis drills. Baseline:  Goal status: Progressing   5.  Patient will report ability to walk her dog for desired length of time without increased knee pain or loss of balance. Baseline:  Goal status: Ongoing     PLAN:  PT FREQUENCY: 1-2x/week  PT DURATION: 8 weeks  PLANNED INTERVENTIONS: [id number]- PT Re-evaluation, 97750- Physical Performance Testing, 97110-Therapeutic exercises, 97530- Therapeutic activity, [id number]- Neuromuscular re-education, 97535- Self Care, [id number]- Manual therapy, [phone]- Gait training, [phone]- Canalith repositioning, [id number]- Aquatic Therapy, [phone]- Electrical stimulation (unattended), [phone]- Electrical stimulation (manual), [id number]- Vasopneumatic device, [id number]- Ultrasound, [id number]- Ionotophoresis  4mg /ml Dexamethasone , 79439 (1-2 muscles), [id number] (3+ muscles)- Dry Needling, Patient/Family education, Balance training, Stair training, Taping, Joint mobilization, Joint manipulation, Scar mobilization, Vestibular training, Cryotherapy, and Moist heat  PLAN FOR NEXT SESSION: Assess and progress HEP as indicated, strengthening, flexibility, manual/dry needling as indicated, Game ready if pain still continues, review positive mindset toward knee recovery, Leg Press, measure knee A/ROM  Lavanda Cleverly, SPT 08/23/23 2:21 PM    I agree with the following treatment note after reviewing documentation. This session was performed under the supervision of a licensed clinician. Jarrell Laming, PT, DPT 08/23/23, 2:21 PM  [facility] [address], [address] [place], [place] [address] Phone # [phone] Fax [phone]

## 2023-08-25 ENCOUNTER — Encounter: Payer: Self-pay | Admitting: Rehabilitative and Restorative Service Providers"

## 2023-08-25 ENCOUNTER — Ambulatory Visit: Admitting: Rehabilitative and Restorative Service Providers"

## 2023-08-25 DIAGNOSIS — R600 Localized edema: Secondary | ICD-10-CM

## 2023-08-25 DIAGNOSIS — M25561 Pain in right knee: Secondary | ICD-10-CM

## 2023-08-25 DIAGNOSIS — M6281 Muscle weakness (generalized): Secondary | ICD-10-CM

## 2023-08-25 DIAGNOSIS — R279 Unspecified lack of coordination: Secondary | ICD-10-CM

## 2023-08-25 DIAGNOSIS — R2681 Unsteadiness on feet: Secondary | ICD-10-CM

## 2023-08-25 DIAGNOSIS — R293 Abnormal posture: Secondary | ICD-10-CM

## 2023-08-25 NOTE — Therapy (Signed)
 OUTPATIENT PHYSICAL THERAPY TREATMENT NOTE   Patient Name: Sierra Perez MRN: [id number] DOB:1960-08-13, 63 y.o., female Today's Date: 08/25/2023  END OF SESSION:  PT End of Session - 08/25/23 0848     Visit Number 6    Date for PT Re-Evaluation 09/17/23    Authorization Type [employer] - no auth required    Authorization - Visit Number 6    Authorization - Number of Visits 23    PT Start Time [phone]    PT Stop Time 0930    PT Time Calculation (min) 44 min    Activity Tolerance Patient tolerated treatment well    Behavior During Therapy WFL for tasks assessed/performed            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, her-2 positive, ER/PR negative   Clotting disorder (HCC)    testing was negative, h/o DVT while on treatment   Colon polyps    Complex regional pain syndrome i of right lower limb    RDS   Complication of anesthesia    Cystitis    Diabetes mellitus without complication (HCC)    DVT (deep venous thrombosis) (HCC)    started in 2015, multiple   Dysrhythmia    Family history of breast cancer    Family history of colon cancer    Fibroid    Fibromyalgia    GERD (gastroesophageal reflux disease)    Heart murmur    HLD (hyperlipidemia)    HPV in female    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   Hypertension    Hypothyroidism    IBS (irritable bowel syndrome)    Mitral valve prolapse    Peptic ulcer    Personal history of malignant neoplasm of breast    Sleep apnea treated with continuous positive airway pressure (CPAP)    Past Surgical History:  Procedure Laterality Date   ABDOMINAL HYSTERECTOMY  2010   fibroids, h/o abnormal pap smears   AXILLARY SENTINEL NODE BIOPSY Right 07/08/2020   Procedure: RIGHT AXILLARY SENTINEL NODE BIOPSY;  Surgeon: Ebbie Cough, MD;  Location: [facility];  Service: General;  Laterality: Right;   BREAST IMPLANT  REMOVAL Right 09/21/2022   Procedure: REMOVAL BREAST IMPLANTS;  Surgeon: Lowery Estefana RAMAN, DO;  Location:  SURGERY CENTER;  Service: Plastics;  Laterality: Right;   BREAST RECONSTRUCTION WITH PLACEMENT OF TISSUE EXPANDER AND FLEX HD (ACELLULAR HYDRATED DERMIS) Bilateral 07/08/2020   Procedure: BILATERAL BREAST RECONSTRUCTION WITH  FLEX HD (ACELLULAR HYDRATED DERMIS),DIRECT IMPLANT RECONSTRUCTION;  Surgeon: Elisabeth Craig RAMAN, MD;  Location: [facility] OR;  Service: Plastics;  Laterality: Bilateral;   BREAST SURGERY Right 2015   right lumpectomy with reduction and lift   COLONOSCOPY     DENTAL SURGERY     ESOPHAGOGASTRODUODENOSCOPY     LAPAROSCOPIC GASTRIC SLEEVE RESECTION  2014   LAPAROSCOPIC OOPHERECTOMY Bilateral 2016   LEFT HEART CATH AND CORONARY ANGIOGRAPHY N/A 07/18/2022   Procedure: LEFT HEART CATH AND CORONARY ANGIOGRAPHY;  Surgeon: Anner Alm ORN, MD;  Location: [facility] INVASIVE CV LAB;  Service: Cardiovascular;  Laterality: N/A;   PERICARDIOCENTESIS N/A 07/20/2022   Procedure: PERICARDIOCENTESIS;  Surgeon: Anner Alm ORN, MD;  Location: [facility] INVASIVE CV LAB;  Service: Cardiovascular;  Laterality: N/A;   REPLACEMENT TOTAL KNEE Left 2018   also had 3 prior arthroscopies  TOE SURGERY Right    dislocated toe   TOTAL HIP ARTHROPLASTY Right 11/19/2020   Procedure: RIGHT TOTAL HIP ARTHROPLASTY ANTERIOR APPROACH;  Surgeon: Vernetta Lonni GRADE, MD;  Location: [facility] OR;  Service: Orthopedics;  Laterality: Right;   TOTAL KNEE ARTHROPLASTY Right 01/09/2022   Procedure: RIGHT TOTAL KNEE ARTHROPLASTY;  Surgeon: Vernetta Lonni GRADE, MD;  Location: [facility];  Service: Orthopedics;  Laterality: Right;   TOTAL MASTECTOMY Bilateral 07/08/2020   Procedure: BILATERAL TOTAL MASTECTOMY;  Surgeon: Ebbie Cough, MD;  Location: [facility] OR;  Service: General;  Laterality: Bilateral;   VENA CAVA FILTER PLACEMENT  2017   Patient Active Problem List   Diagnosis Date Noted   Hyperlipidemia associated with  type 2 diabetes mellitus (HCC) 05/05/2023   Essential hypertension => INCORRECT 05/05/2023   Breast cancer (HCC) 09/21/2022   Acquired absence of breast and absent nipple 08/24/2022   PAF (paroxysmal atrial fibrillation) (HCC) 07/22/2022   Pericardial effusion 07/19/2022   ST elevation 07/18/2022   Breast pain 07/18/2022   Pericarditis 07/18/2022   Nail dystrophy 05/05/2022   Status post total right knee replacement 01/09/2022   Iron  deficiency anemia 01/23/2021   Status post right hip replacement 12/03/2020   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   H/O total hysterectomy 06/11/2020   Chronic superficial gastritis 05/27/2020   Fibromyalgia 05/27/2020   GERD (gastroesophageal reflux disease) 05/27/2020   Complex regional pain syndrome I, unspecified 05/27/2020   Atypical lobular hyperplasia [facility]) of left breast 05/23/2020   History of therapeutic radiation 05/23/2020   Family history of breast cancer    Family history of colon cancer    Unilateral primary osteoarthritis, right knee 02/26/2020   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   DVT (deep venous thrombosis) (HCC) 05/04/2018   Malignant neoplasm of upper-outer quadrant of right breast in female, estrogen receptor negative (HCC) 05/03/2018   Obstructive sleep apnea (adult) (pediatric) 09/14/2017   Adenopathy 07/15/2017   B12 deficiency 07/15/2017   Disorder of tympanic membrane of right ear 07/15/2017   Recurrent UTI 03/09/2017   Proteinuria 02/25/2017   At risk for obstructive sleep apnea 07/01/2016   Suspected sleep apnea 07/01/2016   Arthritis of left knee 05/26/2016   Localized primary osteoarthritis of lower leg, left 05/26/2016   Clotting disorder (HCC) 05/22/2016   Chronic anticoagulation 01/06/2016   Pelvic and perineal pain 11/28/2013   Anxiety and depression 11/16/2013   H/O bariatric surgery 11/16/2013   Atrophic vaginitis 11/13/2013   Tear of lateral cartilage or meniscus of knee, current 04/14/2013   Allergic rhinitis  01/16/2013   Symptomatic menopausal or female climacteric states 11/10/2012   Disequilibrium 06/09/2012   Vitamin D  deficiency 09/13/2008   Preop examination 04/20/2008   Hypothyroidism 05/20/2007    PCP: Ransom Other, MD  REFERRING PROVIDER: Gretta Bertrum ORN, PA-C  REFERRING DIAG: [phone] (ICD-10-CM) - Status post total right knee replacement M76.31 (ICD-10-CM) - It band syndrome, right  THERAPY DIAG:  Right knee pain, unspecified chronicity  Muscle weakness (generalized)  Unspecified lack of coordination  Unsteadiness on feet  Localized edema  Abnormal posture  Rationale for Evaluation and Treatment: Rehabilitation  ONSET DATE: R TKA 01/09/2022  SUBJECTIVE:   SUBJECTIVE STATEMENT: Pt states that she had some pain after session, but then the next day she did not have any pain and does not currently have pain.  PERTINENT HISTORY: Heart Cath with pericardicentesis, Rt THA 2022, Rt TKA 01/09/22, Lt TKA 2018, Hx of Breast Cancer s/p double mastectomy with sentinel lymph node removed from  right side   PAIN:  Are you having pain? Yes: NPRS scale: 0/10 Pain location: right knee and right hip Pain description: aching, sore Aggravating factors: exercise Relieving factors: rest  PRECAUTIONS: Other: bilat mastectomy  RED FLAGS: None   WEIGHT BEARING RESTRICTIONS: No  FALLS:  Has patient fallen in last 6 months? No  LIVING ENVIRONMENT: Lives with: lives alone Lives in: House/apartment Stairs: Yes: Internal: 1 flight steps; can reach both Has following equipment at home: Single point cane and Walker - 2 wheeled  OCCUPATION: Working part-time at [employer]  PLOF: Independent and Leisure: traveling, exercise, reading, gardening, walking her dog (standard poodle)  PATIENT GOALS: Wants to be able to play tennis again and travel without increased pain.  NEXT MD VISIT: 09/06/2023 with Dr Vernetta  OBJECTIVE:  Note: Objective measures were completed at Evaluation  unless otherwise noted.  DIAGNOSTIC FINDINGS: Radiographs of knee reveal well placed joint  PATIENT SURVEYS:  Eval: Lower Extremity Functional Scale:  56 / 80 = 70.0 %    COGNITION: Overall cognitive status: Within functional limits for tasks assessed     SENSATION: Patient denies numbness and tingling  EDEMA:  Patient states that sometimes she notices right knee edema  MUSCLE LENGTH: Hamstrings: right hamstring with some increased tightness over left  POSTURE: rounded shoulders  PALPATION: Some tenderness to palpation along right knee  LOWER EXTREMITY ROM:  Active ROM Right eval Right 08/25/23 Left eval  Knee flexion 0 0 0  Knee extension 127 with pain 130 132   (Blank rows = not tested)  LOWER EXTREMITY MMT:  Eval: Right hip strength 4/5 Right quad strength 4+/5 Right hamstring strength 4/5 Left LE strength is WFL  FUNCTIONAL TESTS:  Eval: 5 times sit to stand: 12.34 sec without UE use (patient bearing most of her weight through LLE) 6 minute walk test: 1,396 ft with knee pain 3/10 (Age related norm is 1,713 ft)  GAIT: Distance walked: >1000 ft Assistive device utilized: None Level of assistance: Complete Independence Comments: with increased right knee pain                                                                                                                                TREATMENT DATE:   08/25/2023: Nustep level 5 LE only x5 min with PT present to discus status Supine hamstring stretch with strap 2x20 sec bilat Supine IT band stretch 2x20 sec bilat Supine hip adduction stretch 2x20 sec bilat Manual therapy: myofascial roller to right hip and IT band region.  Manual trigger point release to various right hip musculature.  Performed to assist with increased tissue elasticity and decreased pain. Posterior pelvic tilt (PPT) 3x8 Glute Bridges with red loop 3x 10 Prone hamstring curls with 3# ankle weights 3x10 bilat   08/23/23 Recombant  bike level 3 for 5 min Hamstring stretch in seated 2x 30 sec Quad set into towel Rt leg 2x15 PPT 2x10 to improve  lumbopelvic stability Glute bridges in supine 3x10 Side lying clamshells red band 2x 10 each side Calf raises in standing 2x 10 using mirror therapy to view muscle activation Calf raise/squeeze ball at ankles 2x 10 to target posterior tibialis muscle strengthening Leg Press 6 (seat) 50 lbs 1x10 changed to 80 lbs for increase intensity Leg press 7 (seat) 80 lbs 3x 10 - instruct pt try pushing with heels so she feels contraction in hamstring Standing abductor taps with 3 lbs ankle weights bil 2 x8  Manual therapy: palpation upon Rt lateral thigh and Rt knee due to pt reporting pain in her IT band insertion point IT band stretch in standing 1 x 20 sec  08/20/2023 Seated hamstring stretch 2x20 sec bil Seated quad set pressing Rt knee into towel 2x10 Side lying clamshells yellow band 2x10 each side PPT in supine 2x8 Glute bridge in supine 2x10 Standing hamstring curls bil 2.5# 2x10 Standing marches bil 2.5# 2x10  Standing calf raises bil at barre Active gastroc stretch 1x10 bil Education- positive mindset effects effective neuromuscular activation and engagement from muscles. Discussed her other health factors that prevented her from recovering her knee how she wanted     PATIENT EDUCATION:  Education details: Issued HEP Person educated: Patient Education method: Explanation, Demonstration, and Handouts Education comprehension: verbalized understanding  HOME EXERCISE PROGRAM: Access Code: [email or web address] URL: [email or web address] Date: 08/25/2023 Prepared by: Jarrell Laming  Exercises - Active Straight Leg Raise with Quad Set  - 1 x daily - 7 x weekly - 2 sets - 10 reps - Supine Bridge  - 1 x daily - 7 x weekly - 2 sets - 8 reps - Clamshell with Resistance  - 1 x daily - 7 x weekly - 3 sets - 10 reps - Seated Long Arc Quad  - 3 x daily - 7 x weekly - 1 sets -  10 reps - 5 hold - Standing Marching  - 1-2 x daily - 7 x weekly - 1 sets - 10 reps - Standing Knee Flexion AROM with Chair Support  - 1 x daily - 7 x weekly - 2 sets - 10 reps - Standing Hip Abduction with Counter Support  - 1 x daily - 7 x weekly - 2 sets - 10 reps - Standing Hamstring Stretch with Step  - 1 x daily - 7 x weekly - 1 sets - 2 reps - 20 sec hold - Mini Squat with Counter Support  - 1 x daily - 7 x weekly - 2 sets - 10 reps - Gastroc Stretch on Wall  - 1 x daily - 7 x weekly - 2 sets - 10 reps - Standing Heel Raise  - 1 x daily - 7 x weekly - 2 sets - 10 reps - Supine Hamstring Stretch with Strap  - 1 x daily - 7 x weekly - 2 reps - 20 sec hold - Supine ITB Stretch with Strap  - 1 x daily - 7 x weekly - 2 reps - 20 sec hold - Hip Adductors and Hamstring Stretch with Strap  - 1 x daily - 7 x weekly - 2 reps - 20 sec hold - Supine Posterior Pelvic Tilt  - 1 x daily - 7 x weekly - 3 sets - 10 reps - Prone Knee Flexion  - 1 x daily - 7 x weekly - 2-3 sets - 10 reps   ASSESSMENT:  CLINICAL IMPRESSION:  Ms Fuquay presents to skilled PT with stating that she had  some pain after last treatment session, but it was gone by the next day.  Patient denies any pain upon arrival to clinic.  Patient with good response to manual therapy today with a good release noted of trigger points.  Patient with good response to stretching and added those exercises to HEP.  Patient required cuing throughout for improved body mechanics and improved technique.  Patient states that pelvic tilts do seem to be helping and she is trying to pay more attention to her posture during the day.  Patient has improved with her knee A/ROM and has met all short term goals at this time and is progressing towards long term goals.    OBJECTIVE IMPAIRMENTS: decreased activity tolerance, decreased balance, difficulty walking, decreased ROM, decreased strength, impaired flexibility, postural dysfunction, and pain  ACTIVITY  LIMITATIONS: squatting, stairs, and locomotion level  PARTICIPATION LIMITATIONS: community activity, occupation, and yard work  PERSONAL FACTORS: Time since onset of injury/illness/exacerbation and 3+ comorbidities: Hx of breast cancer with bilateral mastectomy (recent implant removal), heart cath with pericardicentesis, bilateral TKA, right THA are also affecting patient's functional outcome.   REHAB POTENTIAL: Good  CLINICAL DECISION MAKING: Evolving/moderate complexity  EVALUATION COMPLEXITY: Moderate   GOALS: Goals reviewed with patient? Yes  SHORT TERM GOALS: Target date: 08/20/2023 Pt will be independent with initial HEP to improve ROM and mobility.  Baseline: Goal status: Met on 08/20/23  2.  Patient will increase right knee A/ROM to [facility] without pain at end-range Baseline:  Goal status: Met on 08/25/23   LONG TERM GOALS: Target date: 09/17/2023  Patient will be independent with advanced HEP to allow for self progression after discharge. Baseline:  Goal status: Progressing   2.  Patient will improve score of Lower Extremity Functional Scale to at least 80% to demonstrate improvements in functional tasks. Baseline: 70.0% Goal status: INITIAL  3.  Patient will improve 6 minute walk test to greater than 1,700 ft to place her at her age related norms. Baseline: 1,396 ft with pain of 3/10 in right knee Goal status: INITIAL  4.  Patient will increase right knee/hip strength to [facility] to allow for patient to safely return to playing tennis/performing tennis drills. Baseline:  Goal status: Progressing   5.  Patient will report ability to walk her dog for desired length of time without increased knee pain or loss of balance. Baseline:  Goal status: Ongoing     PLAN:  PT FREQUENCY: 1-2x/week  PT DURATION: 8 weeks  PLANNED INTERVENTIONS: [id number]- PT Re-evaluation, 97750- Physical Performance Testing, 97110-Therapeutic exercises, 97530- Therapeutic activity, 97112-  Neuromuscular re-education, 97535- Self Care, [id number]- Manual therapy, [phone]- Gait training, [phone]- Canalith repositioning, [id number]- Aquatic Therapy, [phone]- Electrical stimulation (unattended), [phone]- Electrical stimulation (manual), [id number]- Vasopneumatic device, [id number]- Ultrasound, [id number]- Ionotophoresis 4mg /ml Dexamethasone , 79439 (1-2 muscles), [id number] (3+ muscles)- Dry Needling, Patient/Family education, Balance training, Stair training, Taping, Joint mobilization, Joint manipulation, Scar mobilization, Vestibular training, Cryotherapy, and Moist heat  PLAN FOR NEXT SESSION: Assess and progress HEP as indicated, strengthening, flexibility, manual/dry needling as indicated    Jarrell Laming, PT, DPT 08/25/23, 9:52 AM  [facility] [address], [address] [place], [place] [address] Phone # [phone] Fax [phone]

## 2023-09-06 ENCOUNTER — Ambulatory Visit: Admitting: Orthopaedic Surgery

## 2023-09-06 ENCOUNTER — Telehealth: Payer: Self-pay | Admitting: Plastic Surgery

## 2023-09-06 ENCOUNTER — Ambulatory Visit (HOSPITAL_COMMUNITY)

## 2023-09-06 NOTE — Telephone Encounter (Signed)
 Patient would like to know current updates about INS covering Nipple tattoo, please advise

## 2023-09-07 ENCOUNTER — Telehealth: Payer: Self-pay | Admitting: Plastic Surgery

## 2023-09-07 ENCOUNTER — Ambulatory Visit: Attending: Physician Assistant | Admitting: Rehabilitative and Restorative Service Providers"

## 2023-09-07 ENCOUNTER — Encounter: Payer: Self-pay | Admitting: Rehabilitative and Restorative Service Providers"

## 2023-09-07 DIAGNOSIS — Z483 Aftercare following surgery for neoplasm: Secondary | ICD-10-CM | POA: Diagnosis present

## 2023-09-07 DIAGNOSIS — R279 Unspecified lack of coordination: Secondary | ICD-10-CM | POA: Diagnosis present

## 2023-09-07 DIAGNOSIS — I972 Postmastectomy lymphedema syndrome: Secondary | ICD-10-CM | POA: Diagnosis present

## 2023-09-07 DIAGNOSIS — L599 Disorder of the skin and subcutaneous tissue related to radiation, unspecified: Secondary | ICD-10-CM | POA: Insufficient documentation

## 2023-09-07 DIAGNOSIS — M25561 Pain in right knee: Secondary | ICD-10-CM | POA: Diagnosis present

## 2023-09-07 DIAGNOSIS — R293 Abnormal posture: Secondary | ICD-10-CM | POA: Insufficient documentation

## 2023-09-07 DIAGNOSIS — M6281 Muscle weakness (generalized): Secondary | ICD-10-CM | POA: Diagnosis present

## 2023-09-07 DIAGNOSIS — R600 Localized edema: Secondary | ICD-10-CM | POA: Insufficient documentation

## 2023-09-07 DIAGNOSIS — R2681 Unsteadiness on feet: Secondary | ICD-10-CM | POA: Insufficient documentation

## 2023-09-07 DIAGNOSIS — M25661 Stiffness of right knee, not elsewhere classified: Secondary | ICD-10-CM | POA: Diagnosis present

## 2023-09-07 NOTE — Therapy (Signed)
 OUTPATIENT PHYSICAL THERAPY TREATMENT NOTE   Patient Name: Sierra Perez MRN: [id number] DOB:[birth year], 63 y.o., female Today's Date: 09/07/2023  END OF SESSION:  PT End of Session - 09/07/23 0855     Visit Number 7    Date for PT Re-Evaluation 09/17/23    Authorization Type [employer] - no auth required    Authorization - Visit Number 7    Authorization - Number of Visits 23    PT Start Time 0848    PT Stop Time 0930    PT Time Calculation (min) 42 min    Activity Tolerance Patient tolerated treatment well    Behavior During Therapy WFL for tasks assessed/performed            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, her-2 positive, ER/PR negative   Clotting disorder (HCC)    testing was negative, h/o DVT while on treatment   Colon polyps    Complex regional pain syndrome i of right lower limb    RDS   Complication of anesthesia    Cystitis    Diabetes mellitus without complication (HCC)    DVT (deep venous thrombosis) (HCC)    started in 2015, multiple   Dysrhythmia    Family history of breast cancer    Family history of colon cancer    Fibroid    Fibromyalgia    GERD (gastroesophageal reflux disease)    Heart murmur    HLD (hyperlipidemia)    HPV in female    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   Hypertension    Hypothyroidism    IBS (irritable bowel syndrome)    Mitral valve prolapse    Peptic ulcer    Personal history of malignant neoplasm of breast    Sleep apnea treated with continuous positive airway pressure (CPAP)    Past Surgical History:  Procedure Laterality Date   ABDOMINAL HYSTERECTOMY  2010   fibroids, h/o abnormal pap smears   AXILLARY SENTINEL NODE BIOPSY Right 07/08/2020   Procedure: RIGHT AXILLARY SENTINEL NODE BIOPSY;  Surgeon: Ebbie Cough, MD;  Location: [facility];  Service: General;  Laterality: Right;   BREAST IMPLANT  REMOVAL Right 09/21/2022   Procedure: REMOVAL BREAST IMPLANTS;  Surgeon: Lowery Estefana RAMAN, DO;  Location: [facility];  Service: Plastics;  Laterality: Right;   BREAST RECONSTRUCTION WITH PLACEMENT OF TISSUE EXPANDER AND FLEX HD (ACELLULAR HYDRATED DERMIS) Bilateral 07/08/2020   Procedure: BILATERAL BREAST RECONSTRUCTION WITH  FLEX HD (ACELLULAR HYDRATED DERMIS),DIRECT IMPLANT RECONSTRUCTION;  Surgeon: Elisabeth Craig RAMAN, MD;  Location: [facility] OR;  Service: Plastics;  Laterality: Bilateral;   BREAST SURGERY Right 2015   right lumpectomy with reduction and lift   COLONOSCOPY     DENTAL SURGERY     ESOPHAGOGASTRODUODENOSCOPY     LAPAROSCOPIC GASTRIC SLEEVE RESECTION  2014   LAPAROSCOPIC OOPHERECTOMY Bilateral 2016   LEFT HEART CATH AND CORONARY ANGIOGRAPHY N/A 07/18/2022   Procedure: LEFT HEART CATH AND CORONARY ANGIOGRAPHY;  Surgeon: Anner Alm ORN, MD;  Location: [facility] INVASIVE CV LAB;  Service: Cardiovascular;  Laterality: N/A;   PERICARDIOCENTESIS N/A 07/20/2022   Procedure: PERICARDIOCENTESIS;  Surgeon: Anner Alm ORN, MD;  Location: [facility] INVASIVE CV LAB;  Service: Cardiovascular;  Laterality: N/A;   REPLACEMENT TOTAL KNEE Left 2018   also had 3 prior arthroscopies  TOE SURGERY Right    dislocated toe   TOTAL HIP ARTHROPLASTY Right 11/19/2020   Procedure: RIGHT TOTAL HIP ARTHROPLASTY ANTERIOR APPROACH;  Surgeon: Vernetta Lonni GRADE, MD;  Location: [facility] OR;  Service: Orthopedics;  Laterality: Right;   TOTAL KNEE ARTHROPLASTY Right 01/09/2022   Procedure: RIGHT TOTAL KNEE ARTHROPLASTY;  Surgeon: Vernetta Lonni GRADE, MD;  Location: [facility];  Service: Orthopedics;  Laterality: Right;   TOTAL MASTECTOMY Bilateral 07/08/2020   Procedure: BILATERAL TOTAL MASTECTOMY;  Surgeon: Ebbie Cough, MD;  Location: [facility] OR;  Service: General;  Laterality: Bilateral;   VENA CAVA FILTER PLACEMENT  2017   Patient Active Problem List   Diagnosis Date Noted   Hyperlipidemia associated with  type 2 diabetes mellitus (HCC) 05/05/2023   Essential hypertension => INCORRECT 05/05/2023   Breast cancer (HCC) 09/21/2022   Acquired absence of breast and absent nipple 08/24/2022   PAF (paroxysmal atrial fibrillation) (HCC) 07/22/2022   Pericardial effusion 07/19/2022   ST elevation 07/18/2022   Breast pain 07/18/2022   Pericarditis 07/18/2022   Nail dystrophy 05/05/2022   Status post total right knee replacement 01/09/2022   Iron  deficiency anemia 01/23/2021   Status post right hip replacement 12/03/2020   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   H/O total hysterectomy 06/11/2020   Chronic superficial gastritis 05/27/2020   Fibromyalgia 05/27/2020   GERD (gastroesophageal reflux disease) 05/27/2020   Complex regional pain syndrome I, unspecified 05/27/2020   Atypical lobular hyperplasia [facility]) of left breast 05/23/2020   History of therapeutic radiation 05/23/2020   Family history of breast cancer    Family history of colon cancer    Unilateral primary osteoarthritis, right knee 02/26/2020   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   DVT (deep venous thrombosis) (HCC) 05/04/2018   Malignant neoplasm of upper-outer quadrant of right breast in female, estrogen receptor negative (HCC) 05/03/2018   Obstructive sleep apnea (adult) (pediatric) 09/14/2017   Adenopathy 07/15/2017   B12 deficiency 07/15/2017   Disorder of tympanic membrane of right ear 07/15/2017   Recurrent UTI 03/09/2017   Proteinuria 02/25/2017   At risk for obstructive sleep apnea 07/01/2016   Suspected sleep apnea 07/01/2016   Arthritis of left knee 05/26/2016   Localized primary osteoarthritis of lower leg, left 05/26/2016   Clotting disorder (HCC) 05/22/2016   Chronic anticoagulation 01/06/2016   Pelvic and perineal pain 11/28/2013   Anxiety and depression 11/16/2013   H/O bariatric surgery 11/16/2013   Atrophic vaginitis 11/13/2013   Tear of lateral cartilage or meniscus of knee, current 04/14/2013   Allergic rhinitis  01/16/2013   Symptomatic menopausal or female climacteric states 11/10/2012   Disequilibrium 06/09/2012   Vitamin D  deficiency 09/13/2008   Preop examination 04/20/2008   Hypothyroidism 05/20/2007    PCP: Ransom Other, MD  REFERRING PROVIDER: Gretta Bertrum ORN, PA-C  REFERRING DIAG: [phone] (ICD-10-CM) - Status post total right knee replacement M76.31 (ICD-10-CM) - It band syndrome, right  THERAPY DIAG:  Right knee pain, unspecified chronicity  Muscle weakness (generalized)  Unspecified lack of coordination  Unsteadiness on feet  Localized edema  Rationale for Evaluation and Treatment: Rehabilitation  ONSET DATE: R TKA 01/09/2022  SUBJECTIVE:   SUBJECTIVE STATEMENT: Pt states that she went to the gym and worked out yesterday.  Reports that she had increased stiffness and soreness last night, but she is feeling better today.  I was out of town last week, but I did some of my exercises  PERTINENT HISTORY: Heart Cath with pericardicentesis, Rt THA 2022, Rt TKA 01/09/22, Lt  TKA 2018, Hx of Breast Cancer s/p double mastectomy with sentinel lymph node removed from right side   PAIN:  Are you having pain? Yes: NPRS scale: 4/10 Pain location: right knee Pain description: aching, sore Aggravating factors: exercise Relieving factors: rest  PRECAUTIONS: Other: bilat mastectomy  RED FLAGS: None   WEIGHT BEARING RESTRICTIONS: No  FALLS:  Has patient fallen in last 6 months? No  LIVING ENVIRONMENT: Lives with: lives alone Lives in: House/apartment Stairs: Yes: Internal: 1 flight steps; can reach both Has following equipment at home: Single point cane and Walker - 2 wheeled  OCCUPATION: Working part-time at [employer]  PLOF: Independent and Leisure: traveling, exercise, reading, gardening, walking her dog (standard poodle)  PATIENT GOALS: Wants to be able to play tennis again and travel without increased pain.  NEXT MD VISIT: 09/06/2023 with Dr  Vernetta  OBJECTIVE:  Note: Objective measures were completed at Evaluation unless otherwise noted.  DIAGNOSTIC FINDINGS: Radiographs of knee reveal well placed joint  PATIENT SURVEYS:  Eval: Lower Extremity Functional Scale:  56 / 80 = 70.0 %    COGNITION: Overall cognitive status: Within functional limits for tasks assessed     SENSATION: Patient denies numbness and tingling  EDEMA:  Patient states that sometimes she notices right knee edema  MUSCLE LENGTH: Hamstrings: right hamstring with some increased tightness over left  POSTURE: rounded shoulders  PALPATION: Some tenderness to palpation along right knee  LOWER EXTREMITY ROM:  Active ROM Right eval Right 08/25/23 Left eval  Knee flexion 0 0 0  Knee extension 127 with pain 130 132   (Blank rows = not tested)  LOWER EXTREMITY MMT:  Eval: Right hip strength 4/5 Right quad strength 4+/5 Right hamstring strength 4/5 Left LE strength is WFL  FUNCTIONAL TESTS:  Eval: 5 times sit to stand: 12.34 sec without UE use (patient bearing most of her weight through LLE) 6 minute walk test: 1,396 ft with knee pain 3/10 (Age related norm is 1,713 ft)  GAIT: Distance walked: >1000 ft Assistive device utilized: None Level of assistance: Complete Independence Comments: with increased right knee pain                                                                                                                                TREATMENT DATE:   09/07/2023: Nustep level 4 LE only x7 min with PT present to discus status Supine hamstring stretch with strap 2x20 sec bilat Supine IT band stretch 2x20 sec bilat Supine hip adduction stretch 2x20 sec bilat Glute Bridges with red loop 3x 10 Side stepping with red loop around knees 5x10 ft bilat Standing calf raises 2x10 Standing calf raises ball at ankles 2x 10 to target posterior tibialis muscle strengthening Leg Press (seat at 7) 90#  2x10 Single leg press 45# 2x8  bilat   08/25/2023: Nustep level 5 LE only x5 min with PT present to discus status Supine hamstring stretch with  strap 2x20 sec bilat Supine IT band stretch 2x20 sec bilat Supine hip adduction stretch 2x20 sec bilat Manual therapy: myofascial roller to right hip and IT band region.  Manual trigger point release to various right hip musculature.  Performed to assist with increased tissue elasticity and decreased pain. Posterior pelvic tilt (PPT) 3x8 Glute Bridges with red loop 3x 10 Prone hamstring curls with 3# ankle weights 3x10 bilat   08/23/23 Recombant bike level 3 for 5 min Hamstring stretch in seated 2x 30 sec Quad set into towel Rt leg 2x15 PPT 2x10 to improve lumbopelvic stability Glute bridges in supine 3x10 Side lying clamshells red band 2x 10 each side Calf raises in standing 2x 10 using mirror therapy to view muscle activation Calf raise/squeeze ball at ankles 2x 10 to target posterior tibialis muscle strengthening Leg Press 6 (seat) 50 lbs 1x10 changed to 80 lbs for increase intensity Leg press 7 (seat) 80 lbs 3x 10 - instruct pt try pushing with heels so she feels contraction in hamstring Standing abductor taps with 3 lbs ankle weights bil 2 x8  Manual therapy: palpation upon Rt lateral thigh and Rt knee due to pt reporting pain in her IT band insertion point IT band stretch in standing 1 x 20 sec      PATIENT EDUCATION:  Education details: Issued HEP Person educated: Patient Education method: Explanation, Facilities manager, and Handouts Education comprehension: verbalized understanding  HOME EXERCISE PROGRAM: Access Code: [email or web address] URL: [email or web address] Date: 08/25/2023 Prepared by: Jarrell Laming  Exercises - Active Straight Leg Raise with Quad Set  - 1 x daily - 7 x weekly - 2 sets - 10 reps - Supine Bridge  - 1 x daily - 7 x weekly - 2 sets - 8 reps - Clamshell with Resistance  - 1 x daily - 7 x weekly - 3 sets - 10 reps - Seated Long  Arc Quad  - 3 x daily - 7 x weekly - 1 sets - 10 reps - 5 hold - Standing Marching  - 1-2 x daily - 7 x weekly - 1 sets - 10 reps - Standing Knee Flexion AROM with Chair Support  - 1 x daily - 7 x weekly - 2 sets - 10 reps - Standing Hip Abduction with Counter Support  - 1 x daily - 7 x weekly - 2 sets - 10 reps - Standing Hamstring Stretch with Step  - 1 x daily - 7 x weekly - 1 sets - 2 reps - 20 sec hold - Mini Squat with Counter Support  - 1 x daily - 7 x weekly - 2 sets - 10 reps - Gastroc Stretch on Wall  - 1 x daily - 7 x weekly - 2 sets - 10 reps - Standing Heel Raise  - 1 x daily - 7 x weekly - 2 sets - 10 reps - Supine Hamstring Stretch with Strap  - 1 x daily - 7 x weekly - 2 reps - 20 sec hold - Supine ITB Stretch with Strap  - 1 x daily - 7 x weekly - 2 reps - 20 sec hold - Hip Adductors and Hamstring Stretch with Strap  - 1 x daily - 7 x weekly - 2 reps - 20 sec hold - Supine Posterior Pelvic Tilt  - 1 x daily - 7 x weekly - 3 sets - 10 reps - Prone Knee Flexion  - 1 x daily - 7 x weekly - 2-3 sets - 10 reps  ASSESSMENT:  CLINICAL IMPRESSION:  Ms Beazley presents to skilled PT stating that she was able to return to the gym for a light workout.  She initially stated her concern that she had some knee soreness and tightness last night after her workout.  Patient states that she is feeling better this morning.  Patient admits that she did not use a cold pack to her knee after her workout when she experienced the soreness.  Educated pt that some soreness after a more advanced workout is okay, as long as it dissipates by the following day and encouraged her to utilize a cold pack x10-15 minutes to her knee after a workout to assist with decreased pain.  Patient verbalizes her understanding.  Patient continues to progress with increased strengthening during her session and able to progress weight on leg press and add in single leg press.  Patient encouraged to continue independent work  outs and in next visits will assess for continued PT vs discharge to continue with independent workouts.  OBJECTIVE IMPAIRMENTS: decreased activity tolerance, decreased balance, difficulty walking, decreased ROM, decreased strength, impaired flexibility, postural dysfunction, and pain  ACTIVITY LIMITATIONS: squatting, stairs, and locomotion level  PARTICIPATION LIMITATIONS: community activity, occupation, and yard work  PERSONAL FACTORS: Time since onset of injury/illness/exacerbation and 3+ comorbidities: Hx of breast cancer with bilateral mastectomy (recent implant removal), heart cath with pericardicentesis, bilateral TKA, right THA are also affecting patient's functional outcome.   REHAB POTENTIAL: Good  CLINICAL DECISION MAKING: Evolving/moderate complexity  EVALUATION COMPLEXITY: Moderate   GOALS: Goals reviewed with patient? Yes  SHORT TERM GOALS: Target date: 08/20/2023 Pt will be independent with initial HEP to improve ROM and mobility.  Baseline: Goal status: Met on 08/20/23  2.  Patient will increase right knee A/ROM to [facility] without pain at end-range Baseline:  Goal status: Met on 08/25/23   LONG TERM GOALS: Target date: 09/17/2023  Patient will be independent with advanced HEP to allow for self progression after discharge. Baseline:  Goal status: Progressing   2.  Patient will improve score of Lower Extremity Functional Scale to at least 80% to demonstrate improvements in functional tasks. Baseline: 70.0% Goal status: INITIAL  3.  Patient will improve 6 minute walk test to greater than 1,700 ft to place her at her age related norms. Baseline: 1,396 ft with pain of 3/10 in right knee Goal status: INITIAL  4.  Patient will increase right knee/hip strength to [facility] to allow for patient to safely return to playing tennis/performing tennis drills. Baseline:  Goal status: Progressing   5.  Patient will report ability to walk her dog for desired length of time without  increased knee pain or loss of balance. Baseline:  Goal status: Ongoing     PLAN:  PT FREQUENCY: 1-2x/week  PT DURATION: 8 weeks  PLANNED INTERVENTIONS: 97164- PT Re-evaluation, 97750- Physical Performance Testing, 97110-Therapeutic exercises, 97530- Therapeutic activity, 97112- Neuromuscular re-education, 97535- Self Care, 02859- Manual therapy, [phone]- Gait training, [phone]- Canalith repositioning, [id number]- Aquatic Therapy, [phone]- Electrical stimulation (unattended), [phone]- Electrical stimulation (manual), [id number]- Vasopneumatic device, [id number]- Ultrasound, [id number]- Ionotophoresis 4mg /ml Dexamethasone , 79439 (1-2 muscles), [id number] (3+ muscles)- Dry Needling, Patient/Family education, Balance training, Stair training, Taping, Joint mobilization, Joint manipulation, Scar mobilization, Vestibular training, Cryotherapy, and Moist heat  PLAN FOR NEXT SESSION: Assess and progress HEP as indicated, strengthening, flexibility, manual/dry needling as indicated    Jarrell Laming, PT, DPT 09/07/23, 9:49 AM  [facility] [address], [address] [place], [place] [address] Phone #  [phone] Fax [phone]

## 2023-09-07 NOTE — Telephone Encounter (Signed)
 Pt has concerns about upcoming apt and the imaging is wrong again she stated and wants to know if she can get a call she does not always see the mychart messages.

## 2023-09-08 ENCOUNTER — Ambulatory Visit: Admitting: Orthopaedic Surgery

## 2023-09-08 ENCOUNTER — Other Ambulatory Visit (HOSPITAL_COMMUNITY)
Admission: RE | Admit: 2023-09-08 | Discharge: 2023-09-08 | Disposition: A | Discharge status: Home / Self Care | Source: Ambulatory Visit | Attending: Obstetrics & Gynecology | Admitting: Obstetrics & Gynecology

## 2023-09-08 ENCOUNTER — Encounter (HOSPITAL_BASED_OUTPATIENT_CLINIC_OR_DEPARTMENT_OTHER): Payer: Self-pay | Admitting: Obstetrics & Gynecology

## 2023-09-08 ENCOUNTER — Ambulatory Visit (HOSPITAL_BASED_OUTPATIENT_CLINIC_OR_DEPARTMENT_OTHER): Admitting: Obstetrics & Gynecology

## 2023-09-08 VITALS — BP 118/91 | HR 71 | Ht 68.0 in | Wt 144.0 lb

## 2023-09-08 DIAGNOSIS — B009 Herpesviral infection, unspecified: Secondary | ICD-10-CM

## 2023-09-08 DIAGNOSIS — B977 Papillomavirus as the cause of diseases classified elsewhere: Secondary | ICD-10-CM

## 2023-09-08 DIAGNOSIS — Z90710 Acquired absence of both cervix and uterus: Secondary | ICD-10-CM

## 2023-09-08 DIAGNOSIS — Z171 Estrogen receptor negative status [ER-]: Secondary | ICD-10-CM

## 2023-09-08 DIAGNOSIS — M85852 Other specified disorders of bone density and structure, left thigh: Secondary | ICD-10-CM

## 2023-09-08 DIAGNOSIS — Z01419 Encounter for gynecological examination (general) (routine) without abnormal findings: Secondary | ICD-10-CM | POA: Diagnosis not present

## 2023-09-08 DIAGNOSIS — N952 Postmenopausal atrophic vaginitis: Secondary | ICD-10-CM

## 2023-09-08 DIAGNOSIS — M8588 Other specified disorders of bone density and structure, other site: Secondary | ICD-10-CM

## 2023-09-08 DIAGNOSIS — R87810 Cervical high risk human papillomavirus (HPV) DNA test positive: Secondary | ICD-10-CM

## 2023-09-08 DIAGNOSIS — C50411 Malignant neoplasm of upper-outer quadrant of right female breast: Secondary | ICD-10-CM | POA: Diagnosis not present

## 2023-09-08 DIAGNOSIS — M85851 Other specified disorders of bone density and structure, right thigh: Secondary | ICD-10-CM

## 2023-09-08 NOTE — Progress Notes (Addendum)
 ANNUAL EXAM Patient name: Sierra Perez MRN [id number]  Date of birth: 1961/01/29 Chief Complaint:   AEX  History of Present Illness:   Sierra Perez is a 63 y.o. G60P1001 Caucasian female being seen today for a routine annual exam.  Denies vaginal bleeding.  H/o hysterectomy.  Had breast implants removed last year.  Lost a friend unexpectedly this past year.  This has given her some perspective and she is feeling a lot of joy.    Has breast MRI scheduled 8/15.  Ultrasound was done and was normal.  This has been ordered due to prior hx and breast implants.     No LMP recorded. Patient has had a hysterectomy.   Last pap:  neg with neg HR HPV 2021. H/O abnormal pap: yes HPV POS Last mammogram: DOUBLE MASTECTOMY. Results were: abnormal BREAST CANCER, RIGHT. Family h/o breast cancer: yes , paternal gm Last colonoscopy: 10/02/2021, repeat 13yrs. Results were: normal. Family h/o colorectal cancer: yes       09/08/2023    9:44 AM 09/14/2022    1:27 PM 08/14/2021    1:56 PM  Depression screen PHQ 2/9  Decreased Interest 0 0 0  Down, Depressed, Hopeless 0 0 1  PHQ - 2 Score 0 0 1      Review of Systems:   Pertinent items are noted in HPI Denies any bowel or bladder changes.  Has chronic constipation.  Denies pelvic pain.  Pertinent History Reviewed:  Reviewed past medical,surgical, social and family history.  Reviewed problem list, medications and allergies. Physical Assessment:   Vitals:   09/08/23 0944  BP: (!) 118/91  Pulse: 71  SpO2: 100%  Weight: 144 lb (65.3 kg)  Height: 5' 8 (1.727 m)  Body mass index is 21.9 kg/m.        Physical Examination:   General appearance - well appearing, and in no distress  Mental status - alert, oriented to person, place, and time  Psych:  She has a normal mood and affect  Skin - warm and dry, normal color, no suspicious lesions noted  Chest - effort normal, all lung fields clear to auscultation bilaterally  Heart - normal rate and regular  rhythm  Neck:  midline trachea, no thyromegaly or nodules  Breasts - surgically absent right breast with area in region of LUQ (on right) with a little more tissue present, no discrete masses noted.  Left breast with no suspicious masses, no skin or nipple changes or axillary nodes  Abdomen - soft, nontender, nondistended, no masses or organomegaly  Pelvic - VULVA: normal appearing vulva with no masses, tenderness or lesions   VAGINA: normal appearing vagina with normal color and discharge, no lesions   CERVIX: surgically absent  Thin prep pap is not indicated  UTERUS: surgically absent  ADNEXA: No adnexal masses or tenderness noted.  Rectal - normal rectal, good sphincter tone, no masses felt.  Extremities:  No swelling or varicosities noted  Chaperone present for exam  No results found for this or any previous visit (from the past 24 hours).  Assessment & Plan:  1. Well woman exam with routine gynecological exam (Primary) - Pap smear updated today - Mammogram not indicated - Colonoscopy 2023.  Follow up 5 years. - Bone mineral density ordered - lab work done with PCP, Dr. Husain - vaccines reviewed/updated  2. HSV (herpes simplex virus) infection - does not need valtrex  RF right now.   3. High risk HPV infection - Cytology - PAP( [facility])  4. H/O total hysterectomy  5. Osteopenia of lumbar spine - DG Bone Density; Future  6. Malignant neoplasm of upper-outer quadrant of right breast in female, estrogen receptor negative (HCC) - followed by oncology yearly   Orders Placed This Encounter  Procedures   DG Bone Density    Meds: No orders of the defined types were placed in this encounter.   Follow-up: Return in about 1 year (around 09/07/2024).  Ronal GORMAN Pinal, MD 09/08/2023 10:44 AM

## 2023-09-08 NOTE — Patient Instructions (Signed)
 Call [phone] to schedule bone density.

## 2023-09-09 ENCOUNTER — Ambulatory Visit: Admitting: Rehabilitative and Restorative Service Providers"

## 2023-09-09 ENCOUNTER — Encounter: Payer: Self-pay | Admitting: Rehabilitative and Restorative Service Providers"

## 2023-09-09 DIAGNOSIS — M6281 Muscle weakness (generalized): Secondary | ICD-10-CM

## 2023-09-09 DIAGNOSIS — R2681 Unsteadiness on feet: Secondary | ICD-10-CM

## 2023-09-09 DIAGNOSIS — R600 Localized edema: Secondary | ICD-10-CM

## 2023-09-09 DIAGNOSIS — M25561 Pain in right knee: Secondary | ICD-10-CM

## 2023-09-09 DIAGNOSIS — R279 Unspecified lack of coordination: Secondary | ICD-10-CM

## 2023-09-09 NOTE — Therapy (Signed)
 OUTPATIENT PHYSICAL THERAPY TREATMENT NOTE   Patient Name: Sierra Perez MRN: [id number] DOB:1960-10-02, 63 y.o., female Today's Date: 09/09/2023  END OF SESSION:  PT End of Session - 09/09/23 0945     Visit Number 8    Date for PT Re-Evaluation 09/17/23    Authorization Type [employer] - no auth required    Authorization - Visit Number 8    Authorization - Number of Visits 23    Progress Note Due on Visit 10    PT Start Time [phone]    PT Stop Time 1015    PT Time Calculation (min) 39 min    Activity Tolerance Patient tolerated treatment well    Behavior During Therapy WFL for tasks assessed/performed            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, her-2 positive, ER/PR negative   Clotting disorder (HCC)    testing was negative, h/o DVT while on treatment   Colon polyps    Complex regional pain syndrome i of right lower limb    RDS   Complication of anesthesia    Cystitis    Diabetes mellitus without complication (HCC)    DVT (deep venous thrombosis) (HCC)    started in 2015, multiple   Dysrhythmia    Family history of breast cancer    Family history of colon cancer    Fibroid    Fibromyalgia    GERD (gastroesophageal reflux disease)    Heart murmur    HLD (hyperlipidemia)    HPV in female    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   Hypertension    Hypothyroidism    IBS (irritable bowel syndrome)    Mitral valve prolapse    Peptic ulcer    Personal history of malignant neoplasm of breast    Sleep apnea treated with continuous positive airway pressure (CPAP)    Past Surgical History:  Procedure Laterality Date   ABDOMINAL HYSTERECTOMY  2010   fibroids, h/o abnormal pap smears   AXILLARY SENTINEL NODE BIOPSY Right 07/08/2020   Procedure: RIGHT AXILLARY SENTINEL NODE BIOPSY;  Surgeon: Ebbie Cough, MD;  Location: [facility];  Service: General;   Laterality: Right;   BREAST IMPLANT REMOVAL Right 09/21/2022   Procedure: REMOVAL BREAST IMPLANTS;  Surgeon: Lowery Estefana RAMAN, DO;  Location: [facility];  Service: Plastics;  Laterality: Right;   BREAST RECONSTRUCTION WITH PLACEMENT OF TISSUE EXPANDER AND FLEX HD (ACELLULAR HYDRATED DERMIS) Bilateral 07/08/2020   Procedure: BILATERAL BREAST RECONSTRUCTION WITH  FLEX HD (ACELLULAR HYDRATED DERMIS),DIRECT IMPLANT RECONSTRUCTION;  Surgeon: Elisabeth Craig RAMAN, MD;  Location: [facility];  Service: Plastics;  Laterality: Bilateral;   BREAST SURGERY Right 2015   right lumpectomy with reduction and lift   COLONOSCOPY     DENTAL SURGERY     ESOPHAGOGASTRODUODENOSCOPY     LAPAROSCOPIC GASTRIC SLEEVE RESECTION  2014   LAPAROSCOPIC OOPHERECTOMY Bilateral 2016   LEFT HEART CATH AND CORONARY ANGIOGRAPHY N/A 07/18/2022   Procedure: LEFT HEART CATH AND CORONARY ANGIOGRAPHY;  Surgeon: Anner Alm ORN, MD;  Location: [facility] INVASIVE CV LAB;  Service: Cardiovascular;  Laterality: N/A;   PERICARDIOCENTESIS N/A 07/20/2022   Procedure: PERICARDIOCENTESIS;  Surgeon: Anner Alm ORN, MD;  Location: [facility] INVASIVE CV LAB;  Service: Cardiovascular;  Laterality: N/A;   REPLACEMENT TOTAL KNEE Left 2018  also had 3 prior arthroscopies   TOE SURGERY Right    dislocated toe   TOTAL HIP ARTHROPLASTY Right 11/19/2020   Procedure: RIGHT TOTAL HIP ARTHROPLASTY ANTERIOR APPROACH;  Surgeon: Vernetta Lonni GRADE, MD;  Location: [facility];  Service: Orthopedics;  Laterality: Right;   TOTAL KNEE ARTHROPLASTY Right 01/09/2022   Procedure: RIGHT TOTAL KNEE ARTHROPLASTY;  Surgeon: Vernetta Lonni GRADE, MD;  Location: [facility];  Service: Orthopedics;  Laterality: Right;   TOTAL MASTECTOMY Bilateral 07/08/2020   Procedure: BILATERAL TOTAL MASTECTOMY;  Surgeon: Ebbie Cough, MD;  Location: [facility];  Service: General;  Laterality: Bilateral;   VENA CAVA FILTER PLACEMENT  2017   Patient Active Problem List   Diagnosis Date  Noted   Absence of both cervix and uterus, acquired 09/08/2023   Hyperlipidemia associated with type 2 diabetes mellitus (HCC) 05/05/2023   Essential hypertension => INCORRECT 05/05/2023   Breast cancer (HCC) 09/21/2022   Acquired absence of breast and absent nipple 08/24/2022   PAF (paroxysmal atrial fibrillation) (HCC) 07/22/2022   Pericardial effusion 07/19/2022   ST elevation 07/18/2022   Breast pain 07/18/2022   Pericarditis 07/18/2022   Nail dystrophy 05/05/2022   Status post total right knee replacement 01/09/2022   Iron  deficiency anemia 01/23/2021   Status post right hip replacement 12/03/2020   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   H/O total hysterectomy 06/11/2020   Chronic superficial gastritis 05/27/2020   Fibromyalgia 05/27/2020   GERD (gastroesophageal reflux disease) 05/27/2020   Complex regional pain syndrome I, unspecified 05/27/2020   Atypical lobular hyperplasia [facility]) of left breast 05/23/2020   History of therapeutic radiation 05/23/2020   Family history of breast cancer    Family history of colon cancer    Unilateral primary osteoarthritis, right knee 02/26/2020   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   DVT (deep venous thrombosis) (HCC) 05/04/2018   Malignant neoplasm of upper-outer quadrant of right breast in female, estrogen receptor negative (HCC) 05/03/2018   Obstructive sleep apnea (adult) (pediatric) 09/14/2017   Adenopathy 07/15/2017   B12 deficiency 07/15/2017   Disorder of tympanic membrane of right ear 07/15/2017   Recurrent UTI 03/09/2017   Proteinuria 02/25/2017   At risk for obstructive sleep apnea 07/01/2016   Suspected sleep apnea 07/01/2016   Arthritis of left knee 05/26/2016   Localized primary osteoarthritis of lower leg, left 05/26/2016   Clotting disorder (HCC) 05/22/2016   Chronic anticoagulation 01/06/2016   Pelvic and perineal pain 11/28/2013   Anxiety and depression 11/16/2013   H/O bariatric surgery 11/16/2013   Atrophic vaginitis  11/13/2013   Tear of lateral cartilage or meniscus of knee, current 04/14/2013   Allergic rhinitis 01/16/2013   Symptomatic menopausal or female climacteric states 11/10/2012   Disequilibrium 06/09/2012   Vitamin D  deficiency 09/13/2008   Preop examination 04/20/2008   Hypothyroidism 05/20/2007    PCP: Ransom Other, MD  REFERRING PROVIDER: Gretta Bertrum ORN, PA-C  REFERRING DIAG: [phone] (ICD-10-CM) - Status post total right knee replacement M76.31 (ICD-10-CM) - It band syndrome, right  THERAPY DIAG:  Right knee pain, unspecified chronicity  Muscle weakness (generalized)  Unspecified lack of coordination  Unsteadiness on feet  Localized edema  Rationale for Evaluation and Treatment: Rehabilitation  ONSET DATE: R TKA 01/09/2022  SUBJECTIVE:   SUBJECTIVE STATEMENT: Pt states that she felt good after her last PT session.  PERTINENT HISTORY: Heart Cath with pericardicentesis, Rt THA 2022, Rt TKA 01/09/22, Lt TKA 2018, Hx of Breast Cancer s/p double mastectomy with sentinel lymph node removed from right  side   PAIN:  Are you having pain? Yes: NPRS scale: 3/10 Pain location: right knee Pain description: aching, sore Aggravating factors: exercise Relieving factors: rest  PRECAUTIONS: Other: bilat mastectomy  RED FLAGS: None   WEIGHT BEARING RESTRICTIONS: No  FALLS:  Has patient fallen in last 6 months? No  LIVING ENVIRONMENT: Lives with: lives alone Lives in: House/apartment Stairs: Yes: Internal: 1 flight steps; can reach both Has following equipment at home: Single point cane and Walker - 2 wheeled  OCCUPATION: Working part-time at [employer]  PLOF: Independent and Leisure: traveling, exercise, reading, gardening, walking her dog (standard poodle)  PATIENT GOALS: Wants to be able to play tennis again and travel without increased pain.  NEXT MD VISIT: 09/06/2023 with Dr Vernetta  OBJECTIVE:  Note: Objective measures were completed at Evaluation unless  otherwise noted.  DIAGNOSTIC FINDINGS: Radiographs of knee reveal well placed joint  PATIENT SURVEYS:  Eval: Lower Extremity Functional Scale:  56 / 80 = 70.0 %    COGNITION: Overall cognitive status: Within functional limits for tasks assessed     SENSATION: Patient denies numbness and tingling  EDEMA:  Patient states that sometimes she notices right knee edema  MUSCLE LENGTH: Hamstrings: right hamstring with some increased tightness over left  POSTURE: rounded shoulders  PALPATION: Some tenderness to palpation along right knee  LOWER EXTREMITY ROM:  Active ROM Right eval Right 08/25/23 Left eval  Knee flexion 0 0 0  Knee extension 127 with pain 130 132   (Blank rows = not tested)  LOWER EXTREMITY MMT:  Eval: Right hip strength 4/5 Right quad strength 4+/5 Right hamstring strength 4/5 Left LE strength is WFL  FUNCTIONAL TESTS:  Eval: 5 times sit to stand: 12.34 sec without UE use (patient bearing most of her weight through LLE) 6 minute walk test: 1,396 ft with knee pain 3/10 (Age related norm is 1,713 ft)  09/09/2023: 6 minute walk test:  1,623 ft  GAIT: Distance walked: >1000 ft Assistive device utilized: None Level of assistance: Complete Independence Comments: with increased right knee pain                                                                                                                                TREATMENT DATE:   09/09/2023: 6 minute walk test:  1,623 ft Seated hamstring stretch 2x20 sec bilat Side stepping with red loop around ankles 3x10 ft bilat FWD and backwards monster walking with red loop around ankles 3x10 ft each Standing calf stretch on half foam bolster 2x20 sec Standing heel raises on half foam bolster 2x10 Standing calf raises ball at ankles 2x 10 to target posterior tibialis muscle strengthening Leg Press (seat at 7) 95#  2x10 Single leg press 45# 2x10 bilat Hip Matrix 25# hip abduction and hip extension 2x10  each bilat   09/07/2023: Nustep level 4 LE only x7 min with PT present to discus status Supine hamstring stretch  with strap 2x20 sec bilat Supine IT band stretch 2x20 sec bilat Supine hip adduction stretch 2x20 sec bilat Glute Bridges with red loop 3x 10 Side stepping with red loop around knees 5x10 ft bilat Standing calf raises 2x10 Standing calf raises ball at ankles 2x 10 to target posterior tibialis muscle strengthening Leg Press (seat at 7) 90#  2x10 Single leg press 45# 2x8 bilat   08/25/2023: Nustep level 5 LE only x5 min with PT present to discus status Supine hamstring stretch with strap 2x20 sec bilat Supine IT band stretch 2x20 sec bilat Supine hip adduction stretch 2x20 sec bilat Manual therapy: myofascial roller to right hip and IT band region.  Manual trigger point release to various right hip musculature.  Performed to assist with increased tissue elasticity and decreased pain. Posterior pelvic tilt (PPT) 3x8 Glute Bridges with red loop 3x 10 Prone hamstring curls with 3# ankle weights 3x10 bilat    PATIENT EDUCATION:  Education details: Issued HEP Person educated: Patient Education method: Explanation, Facilities manager, and Handouts Education comprehension: verbalized understanding  HOME EXERCISE PROGRAM: Access Code: [email or web address] URL: [email or web address] Date: 09/09/2023 Prepared by: Jarrell Laming  Exercises - Active Straight Leg Raise with Quad Set  - 1 x daily - 7 x weekly - 2 sets - 10 reps - Supine Bridge  - 1 x daily - 7 x weekly - 2 sets - 8 reps - Clamshell with Resistance  - 1 x daily - 7 x weekly - 3 sets - 10 reps - Seated Long Arc Quad  - 3 x daily - 7 x weekly - 1 sets - 10 reps - 5 hold - Standing Marching  - 1-2 x daily - 7 x weekly - 1 sets - 10 reps - Standing Knee Flexion AROM with Chair Support  - 1 x daily - 7 x weekly - 2 sets - 10 reps - Standing Hip Abduction with Counter Support  - 1 x daily - 7 x weekly - 2 sets - 10  reps - Standing Hamstring Stretch with Step  - 1 x daily - 7 x weekly - 1 sets - 2 reps - 20 sec hold - Mini Squat with Counter Support  - 1 x daily - 7 x weekly - 2 sets - 10 reps - Gastroc Stretch on Wall  - 1 x daily - 7 x weekly - 2 sets - 10 reps - Standing Heel Raise  - 1 x daily - 7 x weekly - 2 sets - 10 reps - Supine Hamstring Stretch with Strap  - 1 x daily - 7 x weekly - 2 reps - 20 sec hold - Supine ITB Stretch with Strap  - 1 x daily - 7 x weekly - 2 reps - 20 sec hold - Hip Adductors and Hamstring Stretch with Strap  - 1 x daily - 7 x weekly - 2 reps - 20 sec hold - Supine Posterior Pelvic Tilt  - 1 x daily - 7 x weekly - 3 sets - 10 reps - Prone Knee Flexion  - 1 x daily - 7 x weekly - 2-3 sets - 10 reps - Forward Monster Walks  - 1 x daily - 7 x weekly - 3 sets - 10 reps - Backward Monster Walks  - 1 x daily - 7 x weekly - 3 sets - 10 reps   ASSESSMENT:  CLINICAL IMPRESSION:  Ms Muckleroy presents to skilled PT stating that she felt good after her last PT session.  She states that her muscles felt like she had been using them.  Patient able to participate in a 6 minute walk test and was able to perform a distance less than 100 ft away from her age related norms.  Patient encouraged to continue to perform the gym exercises and is on track for likely discharge next week.  Patient provided with green and blue therabands and added monster walking to HEP.  Patient continues to require skilled PT to progress towards goal related activities.  OBJECTIVE IMPAIRMENTS: decreased activity tolerance, decreased balance, difficulty walking, decreased ROM, decreased strength, impaired flexibility, postural dysfunction, and pain  ACTIVITY LIMITATIONS: squatting, stairs, and locomotion level  PARTICIPATION LIMITATIONS: community activity, occupation, and yard work  PERSONAL FACTORS: Time since onset of injury/illness/exacerbation and 3+ comorbidities: Hx of breast cancer with bilateral  mastectomy (recent implant removal), heart cath with pericardicentesis, bilateral TKA, right THA are also affecting patient's functional outcome.   REHAB POTENTIAL: Good  CLINICAL DECISION MAKING: Evolving/moderate complexity  EVALUATION COMPLEXITY: Moderate   GOALS: Goals reviewed with patient? Yes  SHORT TERM GOALS: Target date: 08/20/2023 Pt will be independent with initial HEP to improve ROM and mobility.  Baseline: Goal status: Met on 08/20/23  2.  Patient will increase right knee A/ROM to [facility] without pain at end-range Baseline:  Goal status: Met on 08/25/23   LONG TERM GOALS: Target date: 09/17/2023  Patient will be independent with advanced HEP to allow for self progression after discharge. Baseline:  Goal status: Progressing   2.  Patient will improve score of Lower Extremity Functional Scale to at least 80% to demonstrate improvements in functional tasks. Baseline: 70.0% Goal status: INITIAL  3.  Patient will improve 6 minute walk test to greater than 1,700 ft to place her at her age related norms. Baseline: 1,396 ft with pain of 3/10 in right knee Goal status: Partially Met on 09/09/23 (1,623 ft)  4.  Patient will increase right knee/hip strength to [facility] to allow for patient to safely return to playing tennis/performing tennis drills. Baseline:  Goal status: Progressing   5.  Patient will report ability to walk her dog for desired length of time without increased knee pain or loss of balance. Baseline:  Goal status: Ongoing     PLAN:  PT FREQUENCY: 1-2x/week  PT DURATION: 8 weeks  PLANNED INTERVENTIONS: [id number]- PT Re-evaluation, 97750- Physical Performance Testing, 97110-Therapeutic exercises, 97530- Therapeutic activity, 97112- Neuromuscular re-education, 97535- Self Care, [id number]- Manual therapy, [phone]- Gait training, [phone]- Canalith repositioning, [id number]- Aquatic Therapy, [phone]- Electrical stimulation (unattended), [phone]- Electrical stimulation (manual), [id number]-  Vasopneumatic device, [id number]- Ultrasound, [id number]- Ionotophoresis 4mg /ml Dexamethasone , [id number] (1-2 muscles), [id number] (3+ muscles)- Dry Needling, Patient/Family education, Balance training, Stair training, Taping, Joint mobilization, Joint manipulation, Scar mobilization, Vestibular training, Cryotherapy, and Moist heat  PLAN FOR NEXT SESSION: Assess and progress HEP as indicated, strengthening, flexibility, LEFS    Jarrell Laming, PT, DPT 09/09/23, 10:52 AM  [facility] [address] [place], [place] [address] Phone # [phone] Fax [phone]

## 2023-09-10 ENCOUNTER — Ambulatory Visit: Admitting: Plastic Surgery

## 2023-09-11 NOTE — Addendum Note (Signed)
 Addended by: Chyla Schlender S on: 09/11/2023 12:37 AM   Modules accepted: Orders

## 2023-09-13 ENCOUNTER — Ambulatory Visit: Payer: Self-pay | Attending: Hematology and Oncology

## 2023-09-13 ENCOUNTER — Ambulatory Visit

## 2023-09-13 VITALS — Wt 141.4 lb

## 2023-09-13 DIAGNOSIS — M25561 Pain in right knee: Secondary | ICD-10-CM | POA: Diagnosis not present

## 2023-09-13 DIAGNOSIS — R293 Abnormal posture: Secondary | ICD-10-CM

## 2023-09-13 DIAGNOSIS — R279 Unspecified lack of coordination: Secondary | ICD-10-CM

## 2023-09-13 DIAGNOSIS — M6281 Muscle weakness (generalized): Secondary | ICD-10-CM

## 2023-09-13 DIAGNOSIS — Z483 Aftercare following surgery for neoplasm: Secondary | ICD-10-CM | POA: Insufficient documentation

## 2023-09-13 DIAGNOSIS — M25661 Stiffness of right knee, not elsewhere classified: Secondary | ICD-10-CM

## 2023-09-13 DIAGNOSIS — R2681 Unsteadiness on feet: Secondary | ICD-10-CM

## 2023-09-13 NOTE — Therapy (Signed)
 OUTPATIENT PHYSICAL THERAPY TREATMENT NOTE   Patient Name: Sierra Perez MRN: [id number] DOB:09-08-1960, 63 y.o., female Today's Date: 09/13/2023  END OF SESSION:  PT End of Session - 09/13/23 0946     Visit Number 9    Number of Visits 5    Date for PT Re-Evaluation 09/17/23    Authorization Type [employer] - no auth required    Authorization - Number of Visits 23    Progress Note Due on Visit 10    PT Start Time [phone]    PT Stop Time 0930    PT Time Calculation (min) 43 min    Activity Tolerance Patient tolerated treatment well    Behavior During Therapy WFL for tasks assessed/performed             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, her-2 positive, ER/PR negative   Clotting disorder (HCC)    testing was negative, h/o DVT while on treatment   Colon polyps    Complex regional pain syndrome i of right lower limb    RDS   Complication of anesthesia    Cystitis    Diabetes mellitus without complication (HCC)    DVT (deep venous thrombosis) (HCC)    started in 2015, multiple   Dysrhythmia    Family history of breast cancer    Family history of colon cancer    Fibroid    Fibromyalgia    GERD (gastroesophageal reflux disease)    Heart murmur    HLD (hyperlipidemia)    HPV in female    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   Hypertension    Hypothyroidism    IBS (irritable bowel syndrome)    Mitral valve prolapse    Peptic ulcer    Personal history of malignant neoplasm of breast    Sleep apnea treated with continuous positive airway pressure (CPAP)    Past Surgical History:  Procedure Laterality Date   ABDOMINAL HYSTERECTOMY  2010   fibroids, h/o abnormal pap smears   AXILLARY SENTINEL NODE BIOPSY Right 07/08/2020   Procedure: RIGHT AXILLARY SENTINEL NODE BIOPSY;  Surgeon: Ebbie Cough, MD;  Location: [facility];  Service: General;  Laterality:  Right;   BREAST IMPLANT REMOVAL Right 09/21/2022   Procedure: REMOVAL BREAST IMPLANTS;  Surgeon: Lowery Estefana RAMAN, DO;  Location: [facility];  Service: Plastics;  Laterality: Right;   BREAST RECONSTRUCTION WITH PLACEMENT OF TISSUE EXPANDER AND FLEX HD (ACELLULAR HYDRATED DERMIS) Bilateral 07/08/2020   Procedure: BILATERAL BREAST RECONSTRUCTION WITH  FLEX HD (ACELLULAR HYDRATED DERMIS),DIRECT IMPLANT RECONSTRUCTION;  Surgeon: Elisabeth Craig RAMAN, MD;  Location: [facility];  Service: Plastics;  Laterality: Bilateral;   BREAST SURGERY Right 2015   right lumpectomy with reduction and lift   COLONOSCOPY     DENTAL SURGERY     ESOPHAGOGASTRODUODENOSCOPY     LAPAROSCOPIC GASTRIC SLEEVE RESECTION  2014   LAPAROSCOPIC OOPHERECTOMY Bilateral 2016   LEFT HEART CATH AND CORONARY ANGIOGRAPHY N/A 07/18/2022   Procedure: LEFT HEART CATH AND CORONARY ANGIOGRAPHY;  Surgeon: Anner Alm ORN, MD;  Location: [facility] INVASIVE CV LAB;  Service: Cardiovascular;  Laterality: N/A;   PERICARDIOCENTESIS N/A 07/20/2022   Procedure: PERICARDIOCENTESIS;  Surgeon: Anner Alm ORN, MD;  Location: [facility] INVASIVE CV LAB;  Service: Cardiovascular;  Laterality: N/A;   REPLACEMENT TOTAL KNEE Left 2018  also had 3 prior arthroscopies   TOE SURGERY Right    dislocated toe   TOTAL HIP ARTHROPLASTY Right 11/19/2020   Procedure: RIGHT TOTAL HIP ARTHROPLASTY ANTERIOR APPROACH;  Surgeon: Vernetta Lonni GRADE, MD;  Location: [facility];  Service: Orthopedics;  Laterality: Right;   TOTAL KNEE ARTHROPLASTY Right 01/09/2022   Procedure: RIGHT TOTAL KNEE ARTHROPLASTY;  Surgeon: Vernetta Lonni GRADE, MD;  Location: [facility];  Service: Orthopedics;  Laterality: Right;   TOTAL MASTECTOMY Bilateral 07/08/2020   Procedure: BILATERAL TOTAL MASTECTOMY;  Surgeon: Ebbie Cough, MD;  Location: [facility];  Service: General;  Laterality: Bilateral;   VENA CAVA FILTER PLACEMENT  2017   Patient Active Problem List   Diagnosis Date Noted   Absence  of both cervix and uterus, acquired 09/08/2023   Hyperlipidemia associated with type 2 diabetes mellitus (HCC) 05/05/2023   Essential hypertension => INCORRECT 05/05/2023   Breast cancer (HCC) 09/21/2022   Acquired absence of breast and absent nipple 08/24/2022   PAF (paroxysmal atrial fibrillation) (HCC) 07/22/2022   Pericardial effusion 07/19/2022   ST elevation 07/18/2022   Breast pain 07/18/2022   Pericarditis 07/18/2022   Nail dystrophy 05/05/2022   Status post total right knee replacement 01/09/2022   Iron  deficiency anemia 01/23/2021   Status post right hip replacement 12/03/2020   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   H/O total hysterectomy 06/11/2020   Chronic superficial gastritis 05/27/2020   Fibromyalgia 05/27/2020   GERD (gastroesophageal reflux disease) 05/27/2020   Complex regional pain syndrome I, unspecified 05/27/2020   Atypical lobular hyperplasia [facility]) of left breast 05/23/2020   History of therapeutic radiation 05/23/2020   Family history of breast cancer    Family history of colon cancer    Unilateral primary osteoarthritis, right knee 02/26/2020   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   DVT (deep venous thrombosis) (HCC) 05/04/2018   Malignant neoplasm of upper-outer quadrant of right breast in female, estrogen receptor negative (HCC) 05/03/2018   Obstructive sleep apnea (adult) (pediatric) 09/14/2017   Adenopathy 07/15/2017   B12 deficiency 07/15/2017   Disorder of tympanic membrane of right ear 07/15/2017   Recurrent UTI 03/09/2017   Proteinuria 02/25/2017   At risk for obstructive sleep apnea 07/01/2016   Suspected sleep apnea 07/01/2016   Arthritis of left knee 05/26/2016   Localized primary osteoarthritis of lower leg, left 05/26/2016   Clotting disorder (HCC) 05/22/2016   Chronic anticoagulation 01/06/2016   Pelvic and perineal pain 11/28/2013   Anxiety and depression 11/16/2013   H/O bariatric surgery 11/16/2013   Atrophic vaginitis 11/13/2013   Tear of  lateral cartilage or meniscus of knee, current 04/14/2013   Allergic rhinitis 01/16/2013   Symptomatic menopausal or female climacteric states 11/10/2012   Disequilibrium 06/09/2012   Vitamin D  deficiency 09/13/2008   Preop examination 04/20/2008   Hypothyroidism 05/20/2007    PCP: Ransom Other, MD  REFERRING PROVIDER: Gretta Bertrum ORN, PA-C  REFERRING DIAG: [phone] (ICD-10-CM) - Status post total right knee replacement M76.31 (ICD-10-CM) - It band syndrome, right  THERAPY DIAG:  Right knee pain, unspecified chronicity  Unsteadiness on feet  Muscle weakness (generalized)  Unspecified lack of coordination  Abnormal posture  Stiffness of right knee, not elsewhere classified  Acute pain of right knee  Rationale for Evaluation and Treatment: Rehabilitation  ONSET DATE: R TKA 01/09/2022  SUBJECTIVE:   SUBJECTIVE STATEMENT:  Pt admits she has not been doing her HEP this past week, however did some exercises on Saturday. Pt reports no pain currently.   PERTINENT  HISTORY: Heart Cath with pericardicentesis, Rt THA 2022, Rt TKA 01/09/22, Lt TKA 2018, Hx of Breast Cancer s/p double mastectomy with sentinel lymph node removed from right side   PAIN:  Are you having pain? Yes: NPRS scale: 0/10 Pain location: right knee Pain description: aching, sore Aggravating factors: exercise Relieving factors: rest  PRECAUTIONS: Other: bilat mastectomy  RED FLAGS: None   WEIGHT BEARING RESTRICTIONS: No  FALLS:  Has patient fallen in last 6 months? No  LIVING ENVIRONMENT: Lives with: lives alone Lives in: House/apartment Stairs: Yes: Internal: 1 flight steps; can reach both Has following equipment at home: Single point cane and Walker - 2 wheeled  OCCUPATION: Working part-time at [employer]  PLOF: Independent and Leisure: traveling, exercise, reading, gardening, walking her dog (standard poodle)  PATIENT GOALS: Wants to be able to play tennis again and travel without  increased pain.  NEXT MD VISIT: 09/06/2023 with Dr Vernetta  OBJECTIVE:  Note: Objective measures were completed at Evaluation unless otherwise noted.  DIAGNOSTIC FINDINGS: Radiographs of knee reveal well placed joint  PATIENT SURVEYS:  Eval: Lower Extremity Functional Scale:  56 / 80 = 70.0 %    COGNITION: Overall cognitive status: Within functional limits for tasks assessed     SENSATION: Patient denies numbness and tingling  EDEMA:  Patient states that sometimes she notices right knee edema  MUSCLE LENGTH: Hamstrings: right hamstring with some increased tightness over left  POSTURE: rounded shoulders  PALPATION: Some tenderness to palpation along right knee  LOWER EXTREMITY ROM:  Active ROM Right eval Right 08/25/23 Left eval  Knee flexion 0 0 0  Knee extension 127 with pain 130 132   (Blank rows = not tested)  LOWER EXTREMITY MMT:  Eval: Right hip strength 4/5 Right quad strength 4+/5 Right hamstring strength 4/5 Left LE strength is WFL  FUNCTIONAL TESTS:  Eval: 5 times sit to stand: 12.34 sec without UE use (patient bearing most of her weight through LLE) 6 minute walk test: 1,396 ft with knee pain 3/10 (Age related norm is 1,713 ft)  09/09/2023: 6 minute walk test:  1,623 ft  GAIT: Distance walked: >1000 ft Assistive device utilized: None Level of assistance: Complete Independence Comments: with increased right knee pain                                                                                                                                TREATMENT DATE:   09/13/23 Seated hamstring stretch 2x20 sec bilat Nustep level 5 for 6 min - PT student monitored and discussed pt status Seated piriformis stretch 2x 30 sec  Supine posterior pelvic tilt 3x8 Hip Matrix 55# hip abduction 2x10 each side Hip Matrix 30# hip extension 2x10 each side Hip Matrix 45# hip flexion 2x10 Leg press seat at 7 95# 2x12 Single leg press 45# 2x10 each side     09/09/2023: 6 minute walk test:  1,623 ft Seated hamstring stretch 2x20  sec bilat Side stepping with red loop around ankles 3x10 ft bilat FWD and backwards monster walking with red loop around ankles 3x10 ft each Standing calf stretch on half foam bolster 2x20 sec Standing heel raises on half foam bolster 2x10 Standing calf raises ball at ankles 2x 10 to target posterior tibialis muscle strengthening Leg Press (seat at 7) 95#  2x10 Single leg press 45# 2x10 bilat Hip Matrix 25# hip abduction and hip extension 2x10 each bilat   09/07/2023: Nustep level 4 LE only x7 min with PT present to discus status Supine hamstring stretch with strap 2x20 sec bilat Supine IT band stretch 2x20 sec bilat Supine hip adduction stretch 2x20 sec bilat Glute Bridges with red loop 3x 10 Side stepping with red loop around knees 5x10 ft bilat Standing calf raises 2x10 Standing calf raises ball at ankles 2x 10 to target posterior tibialis muscle strengthening Leg Press (seat at 7) 90#  2x10 Single leg press 45# 2x8 bilat    PATIENT EDUCATION:  Education details: Issued HEP Person educated: Patient Education method: Explanation, Facilities manager, and Handouts Education comprehension: verbalized understanding  HOME EXERCISE PROGRAM: Access Code: [email or web address] URL: [email or web address] Date: 09/09/2023 Prepared by: Jarrell Laming  Exercises - Active Straight Leg Raise with Quad Set  - 1 x daily - 7 x weekly - 2 sets - 10 reps - Supine Bridge  - 1 x daily - 7 x weekly - 2 sets - 8 reps - Clamshell with Resistance  - 1 x daily - 7 x weekly - 3 sets - 10 reps - Seated Long Arc Quad  - 3 x daily - 7 x weekly - 1 sets - 10 reps - 5 hold - Standing Marching  - 1-2 x daily - 7 x weekly - 1 sets - 10 reps - Standing Knee Flexion AROM with Chair Support  - 1 x daily - 7 x weekly - 2 sets - 10 reps - Standing Hip Abduction with Counter Support  - 1 x daily - 7 x weekly - 2 sets - 10 reps - Standing  Hamstring Stretch with Step  - 1 x daily - 7 x weekly - 1 sets - 2 reps - 20 sec hold - Mini Squat with Counter Support  - 1 x daily - 7 x weekly - 2 sets - 10 reps - Gastroc Stretch on Wall  - 1 x daily - 7 x weekly - 2 sets - 10 reps - Standing Heel Raise  - 1 x daily - 7 x weekly - 2 sets - 10 reps - Supine Hamstring Stretch with Strap  - 1 x daily - 7 x weekly - 2 reps - 20 sec hold - Supine ITB Stretch with Strap  - 1 x daily - 7 x weekly - 2 reps - 20 sec hold - Hip Adductors and Hamstring Stretch with Strap  - 1 x daily - 7 x weekly - 2 reps - 20 sec hold - Supine Posterior Pelvic Tilt  - 1 x daily - 7 x weekly - 3 sets - 10 reps - Prone Knee Flexion  - 1 x daily - 7 x weekly - 2-3 sets - 10 reps - Forward Monster Walks  - 1 x daily - 7 x weekly - 3 sets - 10 reps - Backward Monster Walks  - 1 x daily - 7 x weekly - 3 sets - 10 reps   ASSESSMENT:  CLINICAL IMPRESSION: Ms Canino presents to skilled PT stating she has  noticed a significant improvement in LE strength that has improved her functional activity, ever since starting PT. Today's session focused on progressing LE strengthening combined with abdominal exercises to improve overall stability. Pt is progressing with meeting all of her long term goals and has not had any setbacks since discussing not overdoing it several weeks ago. Pt was encouraged to drink plenty of water and eat higher protein foods post session today due to higher muscle mass building exercises performed today. Pt has made excellent progression and will be ready to discharge end of this week.     OBJECTIVE IMPAIRMENTS: decreased activity tolerance, decreased balance, difficulty walking, decreased ROM, decreased strength, impaired flexibility, postural dysfunction, and pain  ACTIVITY LIMITATIONS: squatting, stairs, and locomotion level  PARTICIPATION LIMITATIONS: community activity, occupation, and yard work  PERSONAL FACTORS: Time since onset of  injury/illness/exacerbation and 3+ comorbidities: Hx of breast cancer with bilateral mastectomy (recent implant removal), heart cath with pericardicentesis, bilateral TKA, right THA are also affecting patient's functional outcome.   REHAB POTENTIAL: Good  CLINICAL DECISION MAKING: Evolving/moderate complexity  EVALUATION COMPLEXITY: Moderate   GOALS: Goals reviewed with patient? Yes  SHORT TERM GOALS: Target date: 08/20/2023 Pt will be independent with initial HEP to improve ROM and mobility.  Baseline: Goal status: Met on 08/20/23  2.  Patient will increase right knee A/ROM to [facility] without pain at end-range Baseline:  Goal status: Met on 08/25/23   LONG TERM GOALS: Target date: 09/17/2023  Patient will be independent with advanced HEP to allow for self progression after discharge. Baseline:  Goal status: Progressing   2.  Patient will improve score of Lower Extremity Functional Scale to at least 80% to demonstrate improvements in functional tasks. Baseline: 70.0% Goal status: INITIAL  3.  Patient will improve 6 minute walk test to greater than 1,700 ft to place her at her age related norms. Baseline: 1,396 ft with pain of 3/10 in right knee Goal status: Partially Met on 09/09/23 (1,623 ft)  4.  Patient will increase right knee/hip strength to [facility] to allow for patient to safely return to playing tennis/performing tennis drills. Baseline:  Goal status: Progressing   5.  Patient will report ability to walk her dog for desired length of time without increased knee pain or loss of balance. Baseline:  Goal status: Ongoing     PLAN:  PT FREQUENCY: 1-2x/week  PT DURATION: 8 weeks  PLANNED INTERVENTIONS: [id number]- PT Re-evaluation, 97750- Physical Performance Testing, 97110-Therapeutic exercises, 97530- Therapeutic activity, [id number]- Neuromuscular re-education, 97535- Self Care, [id number]- Manual therapy, [phone]- Gait training, [phone]- Canalith repositioning, [id number]- Aquatic Therapy, [phone]-  Electrical stimulation (unattended), [phone]- Electrical stimulation (manual), [id number]- Vasopneumatic device, [id number]- Ultrasound, [id number]- Ionotophoresis 4mg /ml Dexamethasone , [id number] (1-2 muscles), [id number] (3+ muscles)- Dry Needling, Patient/Family education, Balance training, Stair training, Taping, Joint mobilization, Joint manipulation, Scar mobilization, Vestibular training, Cryotherapy, and Moist heat  PLAN FOR NEXT SESSION: Assess and progress HEP as indicated, strengthening, flexibility, take LEFS    Lavanda Cleverly, SPT 09/13/23 10:13 AM I agree with the following treatment note after reviewing documentation. This session was performed under the supervision of a licensed clinician.  Burnard Joy, PT 09/13/23 10:15 AM   [facility] [address], [address] [place], [place] [address] Phone # [phone] Fax [phone]

## 2023-09-13 NOTE — Therapy (Signed)
 OUTPATIENT PHYSICAL THERAPY SOZO SCREENING NOTE   Patient Name: Sierra Perez MRN: [id number] DOB:1960/03/27, 63 y.o., female Today's Date: 09/13/2023  PCP: Ransom Other, MD REFERRING PROVIDER: Odean Potts, MD   PT End of Session - 09/13/23 0941     Visit Number 8   # unchanged due to screen only   PT Start Time 0939    PT Stop Time 0943    PT Time Calculation (min) 4 min    Activity Tolerance Patient tolerated treatment well    Behavior During Therapy [facility] for tasks assessed/performed          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, her-2 positive, ER/PR negative   Clotting disorder (HCC)    testing was negative, h/o DVT while on treatment   Colon polyps    Complex regional pain syndrome i of right lower limb    RDS   Complication of anesthesia    Cystitis    Diabetes mellitus without complication (HCC)    DVT (deep venous thrombosis) (HCC)    started in 2015, multiple   Dysrhythmia    Family history of breast cancer    Family history of colon cancer    Fibroid    Fibromyalgia    GERD (gastroesophageal reflux disease)    Heart murmur    HLD (hyperlipidemia)    HPV in female    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   Hypertension    Hypothyroidism    IBS (irritable bowel syndrome)    Mitral valve prolapse    Peptic ulcer    Personal history of malignant neoplasm of breast    Sleep apnea treated with continuous positive airway pressure (CPAP)    Past Surgical History:  Procedure Laterality Date   ABDOMINAL HYSTERECTOMY  2010   fibroids, h/o abnormal pap smears   AXILLARY SENTINEL NODE BIOPSY Right 07/08/2020   Procedure: RIGHT AXILLARY SENTINEL NODE BIOPSY;  Surgeon: Ebbie Cough, MD;  Location: [facility] OR;  Service: General;  Laterality: Right;   BREAST IMPLANT REMOVAL Right 09/21/2022   Procedure: REMOVAL BREAST IMPLANTS;  Surgeon:  Lowery Estefana RAMAN, DO;  Location: [facility];  Service: Plastics;  Laterality: Right;   BREAST RECONSTRUCTION WITH PLACEMENT OF TISSUE EXPANDER AND FLEX HD (ACELLULAR HYDRATED DERMIS) Bilateral 07/08/2020   Procedure: BILATERAL BREAST RECONSTRUCTION WITH  FLEX HD (ACELLULAR HYDRATED DERMIS),DIRECT IMPLANT RECONSTRUCTION;  Surgeon: Elisabeth Craig RAMAN, MD;  Location: [facility] OR;  Service: Plastics;  Laterality: Bilateral;   BREAST SURGERY Right 2015   right lumpectomy with reduction and lift   COLONOSCOPY     DENTAL SURGERY     ESOPHAGOGASTRODUODENOSCOPY     LAPAROSCOPIC GASTRIC SLEEVE RESECTION  2014   LAPAROSCOPIC OOPHERECTOMY Bilateral 2016   LEFT HEART CATH AND CORONARY ANGIOGRAPHY N/A 07/18/2022   Procedure: LEFT HEART CATH AND CORONARY ANGIOGRAPHY;  Surgeon: Anner Alm ORN, MD;  Location: [facility] INVASIVE CV LAB;  Service: Cardiovascular;  Laterality: N/A;   PERICARDIOCENTESIS N/A 07/20/2022   Procedure: PERICARDIOCENTESIS;  Surgeon: Anner Alm ORN, MD;  Location: [facility] INVASIVE CV LAB;  Service: Cardiovascular;  Laterality: N/A;   REPLACEMENT TOTAL KNEE Left 2018   also had 3 prior arthroscopies   TOE SURGERY Right    dislocated toe   TOTAL HIP ARTHROPLASTY Right 11/19/2020   Procedure: RIGHT TOTAL HIP ARTHROPLASTY  ANTERIOR APPROACH;  Surgeon: Vernetta Lonni GRADE, MD;  Location: [facility] OR;  Service: Orthopedics;  Laterality: Right;   TOTAL KNEE ARTHROPLASTY Right 01/09/2022   Procedure: RIGHT TOTAL KNEE ARTHROPLASTY;  Surgeon: Vernetta Lonni GRADE, MD;  Location: [facility] ORS;  Service: Orthopedics;  Laterality: Right;   TOTAL MASTECTOMY Bilateral 07/08/2020   Procedure: BILATERAL TOTAL MASTECTOMY;  Surgeon: Ebbie Cough, MD;  Location: [facility] OR;  Service: General;  Laterality: Bilateral;   VENA CAVA FILTER PLACEMENT  2017   Patient Active Problem List   Diagnosis Date Noted   Absence of both cervix and uterus, acquired 09/08/2023   Hyperlipidemia associated with type 2 diabetes  mellitus (HCC) 05/05/2023   Essential hypertension => INCORRECT 05/05/2023   Breast cancer (HCC) 09/21/2022   Acquired absence of breast and absent nipple 08/24/2022   PAF (paroxysmal atrial fibrillation) (HCC) 07/22/2022   Pericardial effusion 07/19/2022   ST elevation 07/18/2022   Breast pain 07/18/2022   Pericarditis 07/18/2022   Nail dystrophy 05/05/2022   Status post total right knee replacement 01/09/2022   Iron  deficiency anemia 01/23/2021   Status post right hip replacement 12/03/2020   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   H/O total hysterectomy 06/11/2020   Chronic superficial gastritis 05/27/2020   Fibromyalgia 05/27/2020   GERD (gastroesophageal reflux disease) 05/27/2020   Complex regional pain syndrome I, unspecified 05/27/2020   Atypical lobular hyperplasia [facility]) of left breast 05/23/2020   History of therapeutic radiation 05/23/2020   Family history of breast cancer    Family history of colon cancer    Unilateral primary osteoarthritis, right knee 02/26/2020   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   DVT (deep venous thrombosis) (HCC) 05/04/2018   Malignant neoplasm of upper-outer quadrant of right breast in female, estrogen receptor negative (HCC) 05/03/2018   Obstructive sleep apnea (adult) (pediatric) 09/14/2017   Adenopathy 07/15/2017   B12 deficiency 07/15/2017   Disorder of tympanic membrane of right ear 07/15/2017   Recurrent UTI 03/09/2017   Proteinuria 02/25/2017   At risk for obstructive sleep apnea 07/01/2016   Suspected sleep apnea 07/01/2016   Arthritis of left knee 05/26/2016   Localized primary osteoarthritis of lower leg, left 05/26/2016   Clotting disorder (HCC) 05/22/2016   Chronic anticoagulation 01/06/2016   Pelvic and perineal pain 11/28/2013   Anxiety and depression 11/16/2013   H/O bariatric surgery 11/16/2013   Atrophic vaginitis 11/13/2013   Tear of lateral cartilage or meniscus of knee, current 04/14/2013   Allergic rhinitis 01/16/2013    Symptomatic menopausal or female climacteric states 11/10/2012   Disequilibrium 06/09/2012   Vitamin D  deficiency 09/13/2008   Preop examination 04/20/2008   Hypothyroidism 05/20/2007    REFERRING DIAG: right breast cancer at risk for lymphedema  THERAPY DIAG: Aftercare following surgery for neoplasm  PERTINENT HISTORY: 2014 Rt breast cancer with lumpectomy with SLNB, radiation, and chemotherapy. Recurrence on the Rt with bil mastectomy with SLNB on 07/08/20 due to grade 2 ILC ER/PR positive.  Other history includes clotting disorder and DVT history. Had a SOZO score increased of 14.3 on 03/13/21 which decreased back to normal in 04/10/21.  Pt with hx of breast cancer s/p reconstruction recently underwent R breast implant removal 09/21/22 and now experiencing tightening R chest wall and limited ROM of RUE. Has hx of pericarditis with pericardial effusion. 01/2022 R TKA   PRECAUTIONS: right UE Lymphedema risk, None  SUBJECTIVE: Pt returns for her first 6 month L-Dex screen.   PAIN:  Are you having pain? No  SOZO SCREENING: Patient  was assessed today using the SOZO machine to determine the lymphedema index score. This was compared to her baseline score. It was determined that she is within the recommended range when compared to her baseline and no further action is needed at this time. She will continue SOZO screenings. These are done every 3 months for 2 years post operatively followed by every 6 months for 2 years, and then annually.   L-DEX FLOWSHEETS - 09/13/23 0900       L-DEX LYMPHEDEMA SCREENING   Measurement Type Unilateral    L-DEX MEASUREMENT EXTREMITY Upper Extremity    POSITION  Standing    DOMINANT SIDE Left    At Risk Side Right    BASELINE SCORE (UNILATERAL) 5.9    L-DEX SCORE (UNILATERAL) 5.2    VALUE CHANGE (UNILAT) -0.7         P: Cont 6 month L-Dex screens next.    Aden Berwyn Caldron, PTA 09/13/2023, 9:42 AM

## 2023-09-15 LAB — CYTOLOGY - PAP
Comment: NEGATIVE
Diagnosis: NEGATIVE
High risk HPV: NEGATIVE

## 2023-09-16 ENCOUNTER — Ambulatory Visit: Admitting: Rehabilitative and Restorative Service Providers"

## 2023-09-16 ENCOUNTER — Encounter: Payer: Self-pay | Admitting: Rehabilitative and Restorative Service Providers"

## 2023-09-16 DIAGNOSIS — Z483 Aftercare following surgery for neoplasm: Secondary | ICD-10-CM

## 2023-09-16 DIAGNOSIS — R279 Unspecified lack of coordination: Secondary | ICD-10-CM

## 2023-09-16 DIAGNOSIS — M6281 Muscle weakness (generalized): Secondary | ICD-10-CM

## 2023-09-16 DIAGNOSIS — M25561 Pain in right knee: Secondary | ICD-10-CM | POA: Diagnosis not present

## 2023-09-16 DIAGNOSIS — I972 Postmastectomy lymphedema syndrome: Secondary | ICD-10-CM

## 2023-09-16 DIAGNOSIS — R2681 Unsteadiness on feet: Secondary | ICD-10-CM

## 2023-09-16 DIAGNOSIS — R293 Abnormal posture: Secondary | ICD-10-CM

## 2023-09-16 DIAGNOSIS — M25661 Stiffness of right knee, not elsewhere classified: Secondary | ICD-10-CM

## 2023-09-16 DIAGNOSIS — L599 Disorder of the skin and subcutaneous tissue related to radiation, unspecified: Secondary | ICD-10-CM

## 2023-09-16 NOTE — Therapy (Signed)
 OUTPATIENT PHYSICAL THERAPY TREATMENT NOTE AND DISCHARGE SUMMARY   Patient Name: Sierra Perez MRN: [id number] DOB:[birth year], 63 y.o., female Today's Date: 09/16/2023  END OF SESSION:  PT End of Session - 09/16/23 1141     Visit Number 10    Number of Visits 5    Date for PT Re-Evaluation 09/17/23    Authorization Type [employer] - no auth required    Progress Note Due on Visit 10    PT Start Time 0930    PT Stop Time 1015    PT Time Calculation (min) 45 min    Activity Tolerance Patient tolerated treatment well    Behavior During Therapy WFL for tasks assessed/performed              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, her-2 positive, ER/PR negative   Clotting disorder (HCC)    testing was negative, h/o DVT while on treatment   Colon polyps    Complex regional pain syndrome i of right lower limb    RDS   Complication of anesthesia    Cystitis    Diabetes mellitus without complication (HCC)    DVT (deep venous thrombosis) (HCC)    started in 2015, multiple   Dysrhythmia    Family history of breast cancer    Family history of colon cancer    Fibroid    Fibromyalgia    GERD (gastroesophageal reflux disease)    Heart murmur    HLD (hyperlipidemia)    HPV in female    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   Hypertension    Hypothyroidism    IBS (irritable bowel syndrome)    Mitral valve prolapse    Peptic ulcer    Personal history of malignant neoplasm of breast    Sleep apnea treated with continuous positive airway pressure (CPAP)    Past Surgical History:  Procedure Laterality Date   ABDOMINAL HYSTERECTOMY  2010   fibroids, h/o abnormal pap smears   AXILLARY SENTINEL NODE BIOPSY Right 07/08/2020   Procedure: RIGHT AXILLARY SENTINEL NODE BIOPSY;  Surgeon: Ebbie Cough, MD;  Location: [facility] OR;  Service: General;  Laterality: Right;   BREAST  IMPLANT REMOVAL Right 09/21/2022   Procedure: REMOVAL BREAST IMPLANTS;  Surgeon: Lowery Estefana RAMAN, DO;  Location: [facility];  Service: Plastics;  Laterality: Right;   BREAST RECONSTRUCTION WITH PLACEMENT OF TISSUE EXPANDER AND FLEX HD (ACELLULAR HYDRATED DERMIS) Bilateral 07/08/2020   Procedure: BILATERAL BREAST RECONSTRUCTION WITH  FLEX HD (ACELLULAR HYDRATED DERMIS),DIRECT IMPLANT RECONSTRUCTION;  Surgeon: Elisabeth Craig RAMAN, MD;  Location: [facility] OR;  Service: Plastics;  Laterality: Bilateral;   BREAST SURGERY Right 2015   right lumpectomy with reduction and lift   COLONOSCOPY     DENTAL SURGERY     ESOPHAGOGASTRODUODENOSCOPY     LAPAROSCOPIC GASTRIC SLEEVE RESECTION  2014   LAPAROSCOPIC OOPHERECTOMY Bilateral 2016   LEFT HEART CATH AND CORONARY ANGIOGRAPHY N/A 07/18/2022   Procedure: LEFT HEART CATH AND CORONARY ANGIOGRAPHY;  Surgeon: Anner Alm ORN, MD;  Location: [facility] INVASIVE CV LAB;  Service: Cardiovascular;  Laterality: N/A;   PERICARDIOCENTESIS N/A 07/20/2022   Procedure: PERICARDIOCENTESIS;  Surgeon: Anner Alm ORN, MD;  Location: [facility] INVASIVE CV LAB;  Service: Cardiovascular;  Laterality: N/A;   REPLACEMENT TOTAL KNEE Left 2018   also had 3  prior arthroscopies   TOE SURGERY Right    dislocated toe   TOTAL HIP ARTHROPLASTY Right 11/19/2020   Procedure: RIGHT TOTAL HIP ARTHROPLASTY ANTERIOR APPROACH;  Surgeon: Vernetta Lonni GRADE, MD;  Location: [facility] OR;  Service: Orthopedics;  Laterality: Right;   TOTAL KNEE ARTHROPLASTY Right 01/09/2022   Procedure: RIGHT TOTAL KNEE ARTHROPLASTY;  Surgeon: Vernetta Lonni GRADE, MD;  Location: [facility];  Service: Orthopedics;  Laterality: Right;   TOTAL MASTECTOMY Bilateral 07/08/2020   Procedure: BILATERAL TOTAL MASTECTOMY;  Surgeon: Ebbie Cough, MD;  Location: [facility] OR;  Service: General;  Laterality: Bilateral;   VENA CAVA FILTER PLACEMENT  2017   Patient Active Problem List   Diagnosis Date Noted   Absence of both cervix  and uterus, acquired 09/08/2023   Hyperlipidemia associated with type 2 diabetes mellitus (HCC) 05/05/2023   Essential hypertension => INCORRECT 05/05/2023   Breast cancer (HCC) 09/21/2022   Acquired absence of breast and absent nipple 08/24/2022   PAF (paroxysmal atrial fibrillation) (HCC) 07/22/2022   Pericardial effusion 07/19/2022   ST elevation 07/18/2022   Breast pain 07/18/2022   Pericarditis 07/18/2022   Nail dystrophy 05/05/2022   Status post total right knee replacement 01/09/2022   Iron  deficiency anemia 01/23/2021   Status post right hip replacement 12/03/2020   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   H/O total hysterectomy 06/11/2020   Chronic superficial gastritis 05/27/2020   Fibromyalgia 05/27/2020   GERD (gastroesophageal reflux disease) 05/27/2020   Complex regional pain syndrome I, unspecified 05/27/2020   Atypical lobular hyperplasia [facility]) of left breast 05/23/2020   History of therapeutic radiation 05/23/2020   Family history of breast cancer    Family history of colon cancer    Unilateral primary osteoarthritis, right knee 02/26/2020   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   DVT (deep venous thrombosis) (HCC) 05/04/2018   Malignant neoplasm of upper-outer quadrant of right breast in female, estrogen receptor negative (HCC) 05/03/2018   Obstructive sleep apnea (adult) (pediatric) 09/14/2017   Adenopathy 07/15/2017   B12 deficiency 07/15/2017   Disorder of tympanic membrane of right ear 07/15/2017   Recurrent UTI 03/09/2017   Proteinuria 02/25/2017   At risk for obstructive sleep apnea 07/01/2016   Suspected sleep apnea 07/01/2016   Arthritis of left knee 05/26/2016   Localized primary osteoarthritis of lower leg, left 05/26/2016   Clotting disorder (HCC) 05/22/2016   Chronic anticoagulation 01/06/2016   Pelvic and perineal pain 11/28/2013   Anxiety and depression 11/16/2013   H/O bariatric surgery 11/16/2013   Atrophic vaginitis 11/13/2013   Tear of lateral  cartilage or meniscus of knee, current 04/14/2013   Allergic rhinitis 01/16/2013   Symptomatic menopausal or female climacteric states 11/10/2012   Disequilibrium 06/09/2012   Vitamin D  deficiency 09/13/2008   Preop examination 04/20/2008   Hypothyroidism 05/20/2007    PCP: Ransom Other, MD  REFERRING PROVIDER: Gretta Bertrum ORN, PA-C  REFERRING DIAG: [phone] (ICD-10-CM) - Status post total right knee replacement M76.31 (ICD-10-CM) - It band syndrome, right  THERAPY DIAG:  Muscle weakness (generalized)  Stiffness of right knee, not elsewhere classified  Unsteadiness on feet  Abnormal posture  Right knee pain, unspecified chronicity  Unspecified lack of coordination  Acute pain of right knee  Aftercare following surgery for neoplasm  Disorder of the skin and subcutaneous tissue related to radiation, unspecified  Postmastectomy lymphedema  Rationale for Evaluation and Treatment: Rehabilitation  ONSET DATE: R TKA 01/09/2022  SUBJECTIVE:   SUBJECTIVE STATEMENT:  Pt reports to PT a little depressed because  her friend decided to go off dialysis. Pt reports taking a muscle relaxer last night due to left shoulder severe stiffness. Relaxer worked, helping her be able to move arm today more than yesterday.   PERTINENT HISTORY: Heart Cath with pericardicentesis, Rt THA 2022, Rt TKA 01/09/22, Lt TKA 2018, Hx of Breast Cancer s/p double mastectomy with sentinel lymph node removed from right side   PAIN:  Are you having pain? No  PRECAUTIONS: Other: bilat mastectomy  RED FLAGS: None   WEIGHT BEARING RESTRICTIONS: No  FALLS:  Has patient fallen in last 6 months? No  LIVING ENVIRONMENT: Lives with: lives alone Lives in: House/apartment Stairs: Yes: Internal: 1 flight steps; can reach both Has following equipment at home: Single point cane and Walker - 2 wheeled  OCCUPATION: Working part-time at [employer]  PLOF: Independent and Leisure: traveling, exercise,  reading, gardening, walking her dog (standard poodle)  PATIENT GOALS: Wants to be able to play tennis again and travel without increased pain.  NEXT MD VISIT: 09/06/2023 with Dr Vernetta  OBJECTIVE:  Note: Objective measures were completed at Evaluation unless otherwise noted.  DIAGNOSTIC FINDINGS: Radiographs of knee reveal well placed joint  PATIENT SURVEYS:  Eval: Lower Extremity Functional Scale:  56 / 80 = 70.0 %  09/16/2023:  Lower Extremity Functional Scale: 73/80 = 91.3%    COGNITION: Overall cognitive status: Within functional limits for tasks assessed     SENSATION: Patient denies numbness and tingling  EDEMA:  Patient states that sometimes she notices right knee edema  MUSCLE LENGTH: Hamstrings: right hamstring with some increased tightness over left  POSTURE: rounded shoulders  PALPATION: Some tenderness to palpation along right knee  LOWER EXTREMITY ROM:  Active ROM Right eval Right 08/25/23 Left eval  Knee flexion 0 0 0  Knee extension 127 with pain 130 132   (Blank rows = not tested)  LOWER EXTREMITY MMT:  Eval: Right hip strength 4/5 Right quad strength 4+/5 Right hamstring strength 4/5 Left LE strength is WFL  FUNCTIONAL TESTS:  Eval: 5 times sit to stand: 12.34 sec without UE use (patient bearing most of her weight through LLE) 6 minute walk test: 1,396 ft with knee pain 3/10 (Age related norm is 1,713 ft)  09/09/2023: 6 minute walk test:  1,623 ft  GAIT: Distance walked: >1000 ft Assistive device utilized: None Level of assistance: Complete Independence Comments: with increased right knee pain                                                                                                                                TREATMENT DATE:   09/16/23 Seated hamstring stretch 2x20 sec bilat Nustep level 5 for 6 min - PT student monitored and discussed pt status Seated piriformis stretch 2x 30 sec  LEFS 91.3% LAQ x10 bilateral LAQ x10  bilateral with AW 3# Prone hamstring curls with AW 3# 2x10 each side-slow descension for eccentric control  Leg  press seat at 7 95# 2x15 Single leg press 45# 2x10 each side  Pendulum 1 min Left shoulder Supine slide arm with dowel      09/13/23 Seated hamstring stretch 2x20 sec bilat Nustep level 5 for 6 min - PT student monitored and discussed pt status Seated piriformis stretch 2x 30 sec  Supine posterior pelvic tilt 3x8 Hip Matrix 55# hip abduction 2x10 each side Hip Matrix 30# hip extension 2x10 each side Hip Matrix 45# hip flexion 2x10 Leg press seat at 7 95# 2x12 Single leg press 45# 2x10 each side  Pendulum 1 min Left shoulder Supine slide arm with dowel    09/09/2023: 6 minute walk test:  1,623 ft Seated hamstring stretch 2x20 sec bilat Side stepping with red loop around ankles 3x10 ft bilat FWD and backwards monster walking with red loop around ankles 3x10 ft each Standing calf stretch on half foam bolster 2x20 sec Standing heel raises on half foam bolster 2x10 Standing calf raises ball at ankles 2x 10 to target posterior tibialis muscle strengthening Leg Press (seat at 7) 95#  2x10 Single leg press 45# 2x10 bilat Hip Matrix 25# hip abduction and hip extension 2x10 each bilat    PATIENT EDUCATION:  Education details: Issued HEP Person educated: Patient Education method: Explanation, Demonstration, and Handouts Education comprehension: verbalized understanding  HOME EXERCISE PROGRAM: Access Code: [email or web address]  URL: [email or web address]  Date: 09/16/2023 Prepared by: Jarrell Laming   Exercises   - Active Straight Leg Raise with Quad Set  - 1 x daily - 7 x weekly - 2 sets - 10 reps  - Supine Bridge  - 1 x daily - 7 x weekly - 2 sets - 8 reps  - Clamshell with Resistance  - 1 x daily - 7 x weekly - 3 sets - 10 reps  - Seated Long Arc Quad  - 3 x daily - 7 x weekly - 1 sets - 10 reps - 5 hold  - Standing Marching  - 1-2 x daily - 7 x weekly - 1 sets  - 10 reps  - Standing Knee Flexion AROM with Chair Support  - 1 x daily - 7 x weekly - 2 sets - 10 reps  - Standing Hip Abduction with Counter Support  - 1 x daily - 7 x weekly - 2 sets - 10 reps  - Standing Hamstring Stretch with Step  - 1 x daily - 7 x weekly - 1 sets - 2 reps - 20 sec hold  - Mini Squat with Counter Support  - 1 x daily - 7 x weekly - 2 sets - 10 reps  - Gastroc Stretch on Wall  - 1 x daily - 7 x weekly - 2 sets - 10 reps  - Standing Heel Raise  - 1 x daily - 7 x weekly - 2 sets - 10 reps  - Supine Hamstring Stretch with Strap  - 1 x daily - 7 x weekly - 2 reps - 20 sec hold  - Supine ITB Stretch with Strap  - 1 x daily - 7 x weekly - 2 reps - 20 sec hold  - Hip Adductors and Hamstring Stretch with Strap  - 1 x daily - 7 x weekly - 2 reps - 20 sec hold  - Supine Posterior Pelvic Tilt  - 1 x daily - 7 x weekly - 3 sets - 10 reps  - Prone Knee Flexion  - 1 x daily - 7 x weekly - 2-3  sets - 10 reps  - Forward Monster Walks  - 1 x daily - 7 x weekly - 3 sets - 10 reps  - Backward Monster Walks  - 1 x daily - 7 x weekly - 3 sets - 10 reps  - Lateral Shuffles  - 1 x daily - 7 x weekly - 3 sets - 10 reps    ASSESSMENT:  CLINICAL IMPRESSION:   Ms Scobee presents to skilled PT stating she has better performance regarding overall knee stability and LE strength ever since starting PT. She has also learned how to listen to her body, deciphering when is too much so that she doesn't repeat anymore setbacks in her progress. Pt scored 91% on LEFS indicating significant improvement from her baseline. Pt has utilized Neurosurgeon and demonstration to prove she is self sufficient with performing her HEP independently. She was given a print out of her updated HEP plan today. She has met all of her goals including her most important one which was to safely use the leg press machine consistently, and has been recommended to practice side stepping before trying to plan tennis. Pt denies  pain and is ready to discharge today and will follow up with ortho next week, as scheduled.    OBJECTIVE IMPAIRMENTS: decreased activity tolerance, decreased balance, difficulty walking, decreased ROM, decreased strength, impaired flexibility, postural dysfunction, and pain  ACTIVITY LIMITATIONS: squatting, stairs, and locomotion level  PARTICIPATION LIMITATIONS: community activity, occupation, and yard work  PERSONAL FACTORS: Time since onset of injury/illness/exacerbation and 3+ comorbidities: Hx of breast cancer with bilateral mastectomy (recent implant removal), heart cath with pericardicentesis, bilateral TKA, right THA are also affecting patient's functional outcome.   REHAB POTENTIAL: Good  CLINICAL DECISION MAKING: Evolving/moderate complexity  EVALUATION COMPLEXITY: Moderate   GOALS: Goals reviewed with patient? Yes  SHORT TERM GOALS: Target date: 08/20/2023 Pt will be independent with initial HEP to improve ROM and mobility.  Baseline: Goal status: Met on 08/20/23  2.  Patient will increase right knee A/ROM to [facility] without pain at end-range Baseline:  Goal status: Met on 08/25/23   LONG TERM GOALS: Target date: 09/17/2023  Patient will be independent with advanced HEP to allow for self progression after discharge. Baseline:  Goal status: MET 09/16/23  2.  Patient will improve score of Lower Extremity Functional Scale to at least 80% to demonstrate improvements in functional tasks. Baseline: 70.0% Goal status: MET 09/16/23 91.3%  3.  Patient will improve 6 minute walk test to greater than 1,700 ft to place her at her age related norms. Baseline: 1,396 ft with pain of 3/10 in right knee Goal status: Partially Met on 09/09/23 (1,623 ft)  4.  Patient will increase right knee/hip strength to [facility] to allow for patient to safely return to playing tennis/performing tennis drills. Baseline:  Goal status: Partially Met; pt advised to practice side stepping before attempting to  place tennis  5.  Patient will report ability to walk her dog for desired length of time without increased knee pain or loss of balance. Baseline:  Goal status: MET     PLAN:  PT FREQUENCY: 1-2x/week  PT DURATION: 8 weeks  PLANNED INTERVENTIONS: 97164- PT Re-evaluation, 97750- Physical Performance Testing, 97110-Therapeutic exercises, 97530- Therapeutic activity, [id number]- Neuromuscular re-education, 97535- Self Care, [id number]- Manual therapy, [id number]- Gait training, [phone]- Canalith repositioning, [id number]- Aquatic Therapy, H9716- Electrical stimulation (unattended), [id number]- Electrical stimulation (manual), [id number]- Vasopneumatic device, [id number]- Ultrasound, [id number]- Ionotophoresis 4mg /ml Dexamethasone , 79439 (1-2 muscles), 20561 (3+ muscles)- Dry  Needling, Patient/Family education, Balance training, Stair training, Taping, Joint mobilization, Joint manipulation, Scar mobilization, Vestibular training, Cryotherapy, and Moist heat   PHYSICAL THERAPY DISCHARGE SUMMARY  Visits from Start of Care: 9  Current functional level related to goals / functional outcomes: Has returned to exercising at the gym, but has not tried tennis again yet.   Remaining deficits: See above   Education / Equipment: Continue HEP   Patient agrees to discharge. Patient goals were partially met. Patient is being discharged due to meeting most goals, and partially meeting goal of 6 minute walk and has not yet returned to tennis.SABRA Lavanda Cleverly, SPT 09/16/23 12:04 PM  I agree with the following treatment note after reviewing documentation. This session was performed under the supervision of a licensed clinician.  Jarrell Laming, PT, DPT 09/16/23, 12:04 PM   [facility] [address] [place], [place] [address] Phone # [phone] Fax [phone]

## 2023-09-17 ENCOUNTER — Ambulatory Visit
Admission: RE | Admit: 2023-09-17 | Discharge: 2023-09-17 | Disposition: A | Discharge status: Home / Self Care | Source: Ambulatory Visit | Attending: Student | Admitting: Student

## 2023-09-17 ENCOUNTER — Other Ambulatory Visit: Payer: Self-pay | Admitting: Student

## 2023-09-17 ENCOUNTER — Other Ambulatory Visit: Payer: Self-pay | Admitting: Plastic Surgery

## 2023-09-17 DIAGNOSIS — Z9013 Acquired absence of bilateral breasts and nipples: Secondary | ICD-10-CM

## 2023-09-17 DIAGNOSIS — T8549XA Other mechanical complication of breast prosthesis and implant, initial encounter: Secondary | ICD-10-CM

## 2023-09-17 MED ORDER — GADOPICLENOL 0.5 MMOL/ML IV SOLN
6.0000 mL | Freq: Once | INTRAVENOUS | Status: AC | PRN
  Administered 2023-09-17: 6 mL via INTRAVENOUS

## 2023-09-19 ENCOUNTER — Ambulatory Visit
Admission: RE | Admit: 2023-09-19 | Discharge: 2023-09-19 | Disposition: A | Discharge status: Home / Self Care | Source: Ambulatory Visit | Attending: Student

## 2023-09-19 DIAGNOSIS — Z9013 Acquired absence of bilateral breasts and nipples: Secondary | ICD-10-CM

## 2023-09-19 DIAGNOSIS — T8549XA Other mechanical complication of breast prosthesis and implant, initial encounter: Secondary | ICD-10-CM

## 2023-09-20 ENCOUNTER — Ambulatory Visit (HOSPITAL_BASED_OUTPATIENT_CLINIC_OR_DEPARTMENT_OTHER): Payer: 59 | Admitting: Obstetrics & Gynecology

## 2023-09-21 ENCOUNTER — Encounter: Payer: Self-pay | Admitting: Plastic Surgery

## 2023-09-21 ENCOUNTER — Ambulatory Visit (INDEPENDENT_AMBULATORY_CARE_PROVIDER_SITE_OTHER): Admitting: Plastic Surgery

## 2023-09-21 VITALS — BP 105/73 | HR 70 | Ht 67.5 in | Wt 137.0 lb

## 2023-09-21 DIAGNOSIS — Z853 Personal history of malignant neoplasm of breast: Secondary | ICD-10-CM | POA: Diagnosis not present

## 2023-09-21 DIAGNOSIS — Z923 Personal history of irradiation: Secondary | ICD-10-CM

## 2023-09-21 DIAGNOSIS — Z9013 Acquired absence of bilateral breasts and nipples: Secondary | ICD-10-CM

## 2023-09-21 NOTE — Progress Notes (Signed)
   Subjective:    Patient ID: Sierra Perez, female    DOB: [birth year], 63 y.o.   MRN: [id number]  The patient is a 63 year old female here for evaluation of her breast.  Her history is that she had breast cancer approximately 10 years ago on the right breast and underwent lumpectomy with radiation.  She then had a mastopexy on the left side for symmetry.  She was diagnosed with breast cancer again in May 2022 and went for bilateral mastectomies with reconstruction.  She had some complications and ended up with removal of her right breast implant by me in August 2024 which was her first surgery with me.  Her MRI was recently done and negative for issue with the left implant.  The patient is comfortable with her current situation.  She is interested in nipple areola tattooing.  No issues noted with the implant.  The other side has a little bit of fluid but I would say maybe 5 cc.  There is no redness and no tenderness.      Review of Systems  Constitutional: Negative.   HENT: Negative.    Eyes: Negative.   Respiratory: Negative.    Cardiovascular: Negative.   Gastrointestinal: Negative.   Endocrine: Negative.   Genitourinary: Negative.        Objective:   Physical Exam Constitutional:      Appearance: Normal appearance.  Cardiovascular:     Rate and Rhythm: Normal rate.     Pulses: Normal pulses.  Pulmonary:     Effort: Pulmonary effort is normal.  Skin:    General: Skin is warm.     Capillary Refill: Capillary refill takes less than 2 seconds.  Neurological:     Mental Status: She is alert and oriented to person, place, and time.  Psychiatric:        Mood and Affect: Mood normal.        Behavior: Behavior normal.        Thought Content: Thought content normal.        Judgment: Judgment normal.           Assessment & Plan:     ICD-10-CM   1. 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

## 2023-09-22 ENCOUNTER — Ambulatory Visit (INDEPENDENT_AMBULATORY_CARE_PROVIDER_SITE_OTHER): Admitting: Orthopaedic Surgery

## 2023-09-22 DIAGNOSIS — Z96651 Presence of right artificial knee joint: Secondary | ICD-10-CM | POA: Diagnosis not present

## 2023-09-22 DIAGNOSIS — M7631 Iliotibial band syndrome, right leg: Secondary | ICD-10-CM | POA: Diagnosis not present

## 2023-09-22 NOTE — Progress Notes (Signed)
 Sierra Perez comes in today for follow-up as a relates to chronic knee IT band pain and knee atrophy.  She has actually both her knees replaced.  Physical therapy released her last week and she is feeling better overall.  She would like to get back into playing tennis.  On exam today neither knee has an effusion.  Both knees are ligamentously stable with good range of motion and I feel like her quads are really strong and they look better overall.  From my standpoint she can get back to playing tennis but she should go slow and stretch before she plays and stretch afterwards and maybe just play short matches or hit with a friend for a little bit.  If things worsen at all she knows to reach out to us .  All questions and concerns were addressed and answered.

## 2023-09-29 ENCOUNTER — Ambulatory Visit: Admitting: Surgical

## 2023-09-29 ENCOUNTER — Ambulatory Visit (HOSPITAL_BASED_OUTPATIENT_CLINIC_OR_DEPARTMENT_OTHER)
Admission: RE | Admit: 2023-09-29 | Discharge: 2023-09-29 | Disposition: A | Discharge status: Home / Self Care | Source: Ambulatory Visit | Attending: Obstetrics & Gynecology | Admitting: Obstetrics & Gynecology

## 2023-09-29 DIAGNOSIS — M85852 Other specified disorders of bone density and structure, left thigh: Secondary | ICD-10-CM | POA: Diagnosis present

## 2023-09-29 DIAGNOSIS — M85851 Other specified disorders of bone density and structure, right thigh: Secondary | ICD-10-CM | POA: Insufficient documentation

## 2023-09-29 DIAGNOSIS — M8588 Other specified disorders of bone density and structure, other site: Secondary | ICD-10-CM | POA: Diagnosis present

## 2023-10-14 ENCOUNTER — Ambulatory Visit (HOSPITAL_BASED_OUTPATIENT_CLINIC_OR_DEPARTMENT_OTHER): Payer: Self-pay | Admitting: Obstetrics & Gynecology

## 2023-11-10 ENCOUNTER — Other Ambulatory Visit (HOSPITAL_BASED_OUTPATIENT_CLINIC_OR_DEPARTMENT_OTHER): Payer: Self-pay

## 2023-11-10 DIAGNOSIS — B009 Herpesviral infection, unspecified: Secondary | ICD-10-CM

## 2023-11-10 MED ORDER — VALACYCLOVIR HCL 500 MG PO TABS: 500.0000 mg | ORAL_TABLET | Freq: Every day | ORAL | 3 refills | Status: AC

## 2023-11-10 NOTE — Telephone Encounter (Signed)
 Patient called in for her valtrex  rx to be refilled. tbw

## 2023-11-30 ENCOUNTER — Encounter: Payer: Self-pay | Admitting: Hematology and Oncology

## 2023-12-06 ENCOUNTER — Encounter: Payer: Self-pay | Admitting: Radiology

## 2023-12-07 ENCOUNTER — Telehealth: Payer: Self-pay | Admitting: *Deleted

## 2023-12-07 NOTE — Telephone Encounter (Signed)
 Received call from pt requesting fax number for referral line.  Pt states she has new insurance and they require a referral be sent from PCP to see Dr. Odean, even though she is an established pt.  RN provided pt with referral fax of [phone].

## 2023-12-13 ENCOUNTER — Encounter: Payer: Self-pay | Admitting: Hematology and Oncology

## 2023-12-16 ENCOUNTER — Encounter: Payer: Self-pay | Admitting: Hematology and Oncology

## 2023-12-18 ENCOUNTER — Other Ambulatory Visit: Payer: Self-pay | Admitting: Hematology and Oncology

## 2023-12-20 ENCOUNTER — Other Ambulatory Visit: Payer: Self-pay

## 2023-12-20 ENCOUNTER — Inpatient Hospital Stay: Payer: 59 | Attending: Hematology and Oncology | Admitting: Hematology and Oncology

## 2023-12-20 ENCOUNTER — Inpatient Hospital Stay: Payer: 59

## 2023-12-20 ENCOUNTER — Encounter: Payer: Self-pay | Admitting: Hematology and Oncology

## 2023-12-20 VITALS — BP 117/60 | HR 72 | Temp 98.2°F | Resp 16 | Wt 144.1 lb

## 2023-12-20 DIAGNOSIS — C50411 Malignant neoplasm of upper-outer quadrant of right female breast: Secondary | ICD-10-CM

## 2023-12-20 DIAGNOSIS — E538 Deficiency of other specified B group vitamins: Secondary | ICD-10-CM

## 2023-12-20 DIAGNOSIS — Z171 Estrogen receptor negative status [ER-]: Secondary | ICD-10-CM

## 2023-12-20 DIAGNOSIS — D509 Iron deficiency anemia, unspecified: Secondary | ICD-10-CM

## 2023-12-20 LAB — CBC WITH DIFFERENTIAL (CANCER CENTER ONLY)
Abs Immature Granulocytes: 0.01 K/uL (ref 0.00–0.07)
Basophils Absolute: 0 K/uL (ref 0.0–0.1)
Basophils Relative: 0 %
Eosinophils Absolute: 0 K/uL (ref 0.0–0.5)
Eosinophils Relative: 1 %
HCT: 35.7 % — ABNORMAL LOW (ref 36.0–46.0)
Hemoglobin: 11.8 g/dL — ABNORMAL LOW (ref 12.0–15.0)
Immature Granulocytes: 0 %
Lymphocytes Relative: 23 %
Lymphs Abs: 1.2 K/uL (ref 0.7–4.0)
MCH: 29.4 pg (ref 26.0–34.0)
MCHC: 33.1 g/dL (ref 30.0–36.0)
MCV: 89 fL (ref 80.0–100.0)
Monocytes Absolute: 0.4 K/uL (ref 0.1–1.0)
Monocytes Relative: 8 %
Neutro Abs: 3.6 K/uL (ref 1.7–7.7)
Neutrophils Relative %: 68 %
Platelet Count: 188 K/uL (ref 150–400)
RBC: 4.01 MIL/uL (ref 3.87–5.11)
RDW: 12.6 % (ref 11.5–15.5)
WBC Count: 5.3 K/uL (ref 4.0–10.5)
nRBC: 0 % (ref 0.0–0.2)

## 2023-12-20 LAB — CMP (CANCER CENTER ONLY)
ALT: 10 U/L (ref 0–44)
AST: 17 U/L (ref 15–41)
Albumin: 4.3 g/dL (ref 3.5–5.0)
Alkaline Phosphatase: 61 U/L (ref 38–126)
Anion gap: 6 (ref 5–15)
BUN: 16 mg/dL (ref 8–23)
CO2: 28 mmol/L (ref 22–32)
Calcium: 9.9 mg/dL (ref 8.9–10.3)
Chloride: 106 mmol/L (ref 98–111)
Creatinine: 0.72 mg/dL (ref 0.44–1.00)
GFR, Estimated: >60 mL/min (ref >60)
Glucose, Bld: 85 mg/dL (ref 70–99)
Potassium: 4.3 mmol/L (ref 3.5–5.1)
Sodium: 140 mmol/L (ref 135–145)
Total Bilirubin: 1 mg/dL (ref 0.0–1.2)
Total Protein: 7.1 g/dL (ref 6.5–8.1)

## 2023-12-20 LAB — VITAMIN B12: Vitamin B-12: 1920 pg/mL — ABNORMAL HIGH (ref 180–914)

## 2023-12-20 LAB — FERRITIN: Ferritin: 61 ng/mL (ref 11–307)

## 2023-12-20 NOTE — Progress Notes (Signed)
 Patient Care Team: Ransom Other, MD as PCP - General (Internal Medicine) Anner Alm ORN, MD as PCP - Cardiology (Cardiology) Tyree Nanetta SAILOR, RN as Oncology Nurse Navigator Odean Potts, MD as Consulting Physician (Hematology and Oncology)  DIAGNOSIS:  Encounter Diagnosis  Name Primary?   Malignant neoplasm of upper-outer quadrant of right breast in female, estrogen receptor negative (HCC) Yes    SUMMARY OF ONCOLOGIC HISTORY: 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-, PR-, HER positive 3+, Ki67 20-25%   08/27/2012 Cancer Staging   Staging form: Breast, AJCC 8th Edition - Clinical stage from 08/27/2012: Stage IIA (cT2, cN0, cM0, G3, ER-, PR-, HER2+) - Signed by Odean Potts, MD on 11/17/2019   09/06/2012 Breast MRI   Right breast, lateral to the nipple, 10x14x58mm mass with mildly irregular borders   09/10/2012 Echocardiogram   EF 55-60%    Chemotherapy   Neoadjuvant Taxotere/Carboplatin/Herceptin/Perjeta followed by adjuvant Herceptin maintenance   01/09/2013 Breast MRI   Complete pathological response to neoadjuvant treatment: right breast mass previously noted at 9:00 position no longer identified.   02/13/2013 Surgery   Right breast lumpectomy: benign parenchyma with some stromal fibrosis consistent with previous tumor site, focal area of atypical lobular hyperplasia, all lymph nodes negative.     Radiation Therapy   Adjuvant radiation   04/06/2013 Imaging   DEXA: T-score of -1.1, osteopenia    09/25/2016 Initial Biopsy   Columnar cell alteration with atypia, foreign body granuloma   05/10/2020 Relapse/Recurrence   Breast MRI detected new linear non-mass enhancement right breast 2.1 cm, non-mass enhancement centrally left breast spanning 10.5 cm, left breast nipple areolar complex enhancement: Biopsy right breast: Grade 2  invasive lobular cancer.  ER/PR positive HER-2 equivocal by IHC FISH pending, Ki-67 1% left breast biopsy anterior fibrocystic change, posterior atypical lobular hyperplasia/LCIS   05/26/2020 Genetic Testing   Negative genetic testing on the CancerNext-Expanded+RNAinsight panel.  The CancerNext-Expanded gene panel offered by [facility] and includes sequencing and rearrangement analysis for the following 77 genes: AIP, ALK, APC*, ATM*, AXIN2, BAP1, BARD1, BLM, BMPR1A, BRCA1*, BRCA2*, BRIP1*, CDC73, CDH1*, CDK4, CDKN1B, CDKN2A, CHEK2*, CTNNA1, DICER1, FANCC, FH, FLCN, GALNT12, KIF1B, LZTR1, MAX, MEN1, MET, MLH1*, MSH2*, MSH3, MSH6*, MUTYH*, NBN, NF1*, NF2, NTHL1, PALB2*, PHOX2B, PMS2*, POT1, PRKAR1A, PTCH1, PTEN*, RAD51C*, RAD51D*, RB1, RECQL, RET, SDHA, SDHAF2, SDHB, SDHC, SDHD, SMAD4, SMARCA4, SMARCB1, SMARCE1, STK11, SUFU, TMEM127, TP53*, TSC1, TSC2, VHL and XRCC2 (sequencing and deletion/duplication); EGFR, EGLN1, HOXB13, KIT, MITF, PDGFRA, POLD1, and POLE (sequencing only); EPCAM and GREM1 (deletion/duplication only). DNA and RNA analyses performed for * genes. The report date is May 26, 2020.    07/08/2020 Surgery   Bilateral mastectomies with reconstruction: Right mastectomy: Grade 2 ILC 0.9 cm, ALH, margins negative, 0/1 lymph, left mastectomy: Benign    08/2020 -  Anti-estrogen oral therapy   adjuvant antiestrogen therapy with anastrozole  1 mg daily x7 years started July 2022      CHIEF COMPLIANT:   HISTORY OF PRESENT ILLNESS:   History of Present Illness Sierra Perez is a 63 year old female who presents for a follow-up regarding her diabetes medication management and side effects.  She has been on Mounjaro  for diabetes management, which has also contributed to weight loss. Due to insurance requirements, she switched to Ozempic and is concerned about potential side effects, although she has not experienced significant side effects  from Mounjaro . She has experienced significant weight  loss with Mounjaro  and aims to maintain her current weight while controlling blood sugar levels and appetite. Reducing Mounjaro  dosage has led to increased appetite.  She has been taking anastrozole  for nearly three and a half years without significant side effects, though she experiences general aches, which she attributes to aging.     ALLERGIES:  is allergic to succinylcholine, azithromycin, keflex  [cephalexin ], silver, sulfa antibiotics, and sulfasalazine.  MEDICATIONS:  Current Outpatient Medications  Medication Sig Dispense Refill   acetaminophen  (TYLENOL ) 500 MG tablet Take 1,000 mg by mouth every 6 (six) hours as needed for moderate pain.     albuterol  (VENTOLIN  HFA) 108 (90 Base) MCG/ACT inhaler Inhale 1 puff into the lungs every 6 (six) hours as needed for wheezing or shortness of breath.     anastrozole  (ARIMIDEX ) 1 MG tablet TAKE 1 TABLET(1 MG) BY MOUTH DAILY 90 tablet 3   Bacillus Coagulans-Inulin (PROBIOTIC) 1-250 BILLION-MG CAPS Take 250 mg by mouth daily.     CALCIUM  CITRATE PO Take 3 tablets by mouth daily.     Cholecalciferol  (VITAMIN D -3) 25 MCG (1000 UT) CAPS Take 1,000 Units by mouth daily.     DULoxetine  (CYMBALTA ) 60 MG capsule Take 60 mg by mouth daily.     ELIQUIS  5 MG TABS tablet TAKE 1 TABLET(5 MG) BY MOUTH TWICE DAILY 180 tablet 2   enoxaparin  (LOVENOX ) 60 MG/0.6ML injection Inject 0.6 mLs (60 mg total) into the skin every 12 (twelve) hours. Start two days before tattoo 2.4 mL 0   fluticasone  (FLONASE ) 50 MCG/ACT nasal spray Place 2 sprays into both nostrils daily.     gabapentin  (NEURONTIN ) 600 MG tablet TAKE 1 TABLET(600 MG) BY MOUTH THREE TIMES DAILY 90 tablet 2   ipratropium (ATROVENT ) 0.03 % nasal spray Place 2 sprays into both nostrils 2 (two) times daily.     levothyroxine  (SYNTHROID ) 50 MCG tablet Take 50 mcg by mouth every morning.     pantoprazole  (PROTONIX ) 40 MG tablet Take 1 tablet (40 mg total) by mouth daily.     rosuvastatin  (CRESTOR ) 5 MG tablet  TAKE 1 TABLET BY MOUTH EVERY DAY 30 tablet 0   tirzepatide  (MOUNJARO ) 7.5 MG/0.5ML Pen ADMINISTER 7.5 MG UNDER THE SKIN 1 TIME A WEEK 2 mL 3   valACYclovir  (VALTREX ) 500 MG tablet Take 1 tablet (500 mg total) by mouth daily. Increase to bid x 3 days with symptoms 90 tablet 3   Current Facility-Administered Medications  Medication Dose Route Frequency Provider Last Rate Last Admin   triamcinolone  acetonide (KENALOG ) 10 MG/ML injection 10 mg  10 mg Other Once Scherrie Lamarr SQUIBB, DPM        PHYSICAL EXAMINATION: ECOG PERFORMANCE STATUS: 1 - Symptomatic but completely ambulatory  There were no vitals filed for this visit. There were no vitals filed for this visit.  Physical Exam BREAST: Breast implants appear normal. SKIN: Small bumps on skin, resembling ganglion cysts.  (exam performed in the presence of a chaperone)  LABORATORY DATA:  I have reviewed the data as listed    Latest Ref Rng & Units 12/18/2022    8:26 AM 11/09/2022   11:16 AM 07/22/2022    9:09 AM  CMP  Glucose 70 - 99 mg/dL 85  77  88   BUN 8 - 23 mg/dL 20  15  14    Creatinine 0.44 - 1.00 mg/dL 9.02  9.03  9.36   Sodium 135 - 145 mmol/L 139  140  138   Potassium 3.5 - 5.1 mmol/L 3.9  3.9  3.6   Chloride 98 - 111 mmol/L 106  106  112   CO2 22 - 32 mmol/L 28  27  21    Calcium  8.9 - 10.3 mg/dL 9.6  9.9  7.8   Total Protein 6.5 - 8.1 g/dL 6.9  7.0    Total Bilirubin <1.2 mg/dL 0.7  0.9    Alkaline Phos 38 - 126 U/L 75  63    AST 15 - 41 U/L 23  21    ALT 0 - 44 U/L 22  18      Lab Results  Component Value Date   WBC 5.3 12/20/2023   HGB 11.8 (L) 12/20/2023   HCT 35.7 (L) 12/20/2023   MCV 89.0 12/20/2023   PLT 188 12/20/2023   NEUTROABS 3.6 12/20/2023    ASSESSMENT & PLAN:  Malignant neoplasm of upper-outer quadrant of right breast in female, estrogen receptor negative (HCC) 08/27/2012: Right breast: Stage Ia IDC with DCIS T2 N0 M0 ER negative PR negative, HER-2 positive, Ki-67 20 to 25% status post  neoadjuvant TCHP: Complete pathologic response, adjuvant radiation   05/10/2020: Recurrence/relapse: Right breast: 2.1 cm non-mass enhancement: Biopsy grade 2 invasive lobular cancer ER/PR positive HER-2 equivocal by IHC FISH pending, Ki-67 1% Left breast: 10.5 cm non-mass enhancement anterior biopsy fibrocystic change, posterior biopsy ALH/LCIS   Treatment summary: 1.   07/08/2020 Bilateral mastectomies with reconstruction: Right mastectomy: Grade 2 ILC 0.9 cm, ALH, margins negative, 0/1 lymph, left mastectomy: Benign 2. adjuvant antiestrogen therapy with anastrozole  1 mg daily x7 years started July 2022 Genetic testing: Neg ------------------------------------------------------------ Current treatment: Adjuvant antiestrogen therapy with anastrozole  1 mg daily x7 years started July 2022 Anastrozole  Toxicities: Denies any major side effects to anastrozole  therapy. Palpable lump in the right axilla: Ultrasound 03/24/2022: No sonographic evidence of malignancy.   CT angio chest 07/16/2022: No PE, stable 3 mm right lung nodule Hospitalization 07/18/2022-07/22/2022: (Chest Pain leaning forward) Pericarditis and pericardial effusion status post pericardiocentesis: Cytology benign, A.Fib Anemia: During hospitalization.   Implant removal: 09/2022   Lab review 11/09/2022: Hemoglobin 12.3, B12 175 (gastric sleeve 10 years ago), ferritin 22, iron  saturation 22%, CMP is normal   Recommendation:   B12 supplementation 5000 mcg sublingual daily  Breast MRI 09/17/2023: No evidence of malignancy in bilateral breasts.  Left breast implant is intact.  Small right breast seroma.  Density category B No role of mammograms associated bilateral mastectomies  Weight loss journey: Patient had been on Mounjaro  for 2 years and had lost substantial weight.  Her insurance stopped covering it and she is now on Ozempic.  Return to clinic in 1 year for follow-up with labs  No orders of the defined types were placed in this  encounter.  The patient has a good understanding of the overall plan. she agrees with it. she will call with any problems that may develop before the next visit here.  I personally spent a total of 30 minutes in the care of the patient today including preparing to see the patient, getting/reviewing separately obtained history, performing a medically appropriate exam/evaluation, counseling and educating, placing orders, referring and communicating with other health care professionals, documenting clinical information in the EHR, independently interpreting results, communicating results, and coordinating care.   Viinay K Cornelius Schuitema, MD 12/20/23

## 2023-12-20 NOTE — Assessment & Plan Note (Signed)
 08/27/2012: Right breast: Stage Ia IDC with DCIS T2 N0 M0 ER negative PR negative, HER-2 positive, Ki-67 20 to 25% status post neoadjuvant TCHP: Complete pathologic response, adjuvant radiation   05/10/2020: Recurrence/relapse: Right breast: 2.1 cm non-mass enhancement: Biopsy grade 2 invasive lobular cancer ER/PR positive HER-2 equivocal by IHC FISH pending, Ki-67 1% Left breast: 10.5 cm non-mass enhancement anterior biopsy fibrocystic change, posterior biopsy ALH/LCIS   Treatment summary: 1.   07/08/2020 Bilateral mastectomies with reconstruction: Right mastectomy: Grade 2 ILC 0.9 cm, ALH, margins negative, 0/1 lymph, left mastectomy: Benign 2. adjuvant antiestrogen therapy with anastrozole  1 mg daily x7 years started July 2022 Genetic testing: Neg ------------------------------------------------------------ Current treatment: Adjuvant antiestrogen therapy with anastrozole  1 mg daily x7 years started July 2022 Anastrozole  Toxicities: Denies any major side effects to anastrozole  therapy. Palpable lump in the right axilla: Ultrasound 03/24/2022: No sonographic evidence of malignancy.   CT angio chest 07/16/2022: No PE, stable 3 mm right lung nodule Hospitalization 07/18/2022-07/22/2022: (Chest Pain leaning forward) Pericarditis and pericardial effusion status post pericardiocentesis: Cytology benign, A.Fib Anemia: During hospitalization.   Implant removal: 09/2022   Lab review 11/09/2022: Hemoglobin 12.3, B12 175 (gastric sleeve 10 years ago), ferritin 22, iron  saturation 22%, CMP is normal   Recommendation:   B12 supplementation 5000 mcg sublingual daily  Breast MRI 09/17/2023: No evidence of malignancy in bilateral breasts.  Left breast implant is intact.  Small right breast seroma.  Density category B No role of mammograms associated bilateral mastectomies

## 2023-12-28 ENCOUNTER — Encounter: Payer: Self-pay | Admitting: Hematology and Oncology

## 2024-01-14 ENCOUNTER — Telehealth: Payer: Self-pay

## 2024-01-14 NOTE — Telephone Encounter (Signed)
 Enter in error

## 2024-02-08 ENCOUNTER — Telehealth: Payer: Self-pay

## 2024-02-08 NOTE — Telephone Encounter (Signed)
 Unresponsive to outreach mobile draw with guardant.

## 2024-02-14 ENCOUNTER — Emergency Department (HOSPITAL_BASED_OUTPATIENT_CLINIC_OR_DEPARTMENT_OTHER)

## 2024-02-14 ENCOUNTER — Encounter (HOSPITAL_BASED_OUTPATIENT_CLINIC_OR_DEPARTMENT_OTHER): Payer: Self-pay | Admitting: Emergency Medicine

## 2024-02-14 ENCOUNTER — Emergency Department (HOSPITAL_BASED_OUTPATIENT_CLINIC_OR_DEPARTMENT_OTHER)
Admission: EM | Admit: 2024-02-14 | Discharge: 2024-02-14 | Disposition: A | Discharge status: Home / Self Care | Attending: Emergency Medicine | Admitting: Emergency Medicine

## 2024-02-14 ENCOUNTER — Other Ambulatory Visit: Payer: Self-pay

## 2024-02-14 DIAGNOSIS — W228XXA Striking against or struck by other objects, initial encounter: Secondary | ICD-10-CM | POA: Insufficient documentation

## 2024-02-14 DIAGNOSIS — Z7901 Long term (current) use of anticoagulants: Secondary | ICD-10-CM | POA: Insufficient documentation

## 2024-02-14 DIAGNOSIS — S0990XA Unspecified injury of head, initial encounter: Secondary | ICD-10-CM | POA: Diagnosis present

## 2024-02-14 NOTE — ED Notes (Signed)
 Patient to CT

## 2024-02-14 NOTE — Discharge Instructions (Signed)
 Your CT imaging of the head was negative for acute intracranial abnormality.  You are safe to resume your Eliquis .

## 2024-02-14 NOTE — ED Provider Notes (Signed)
 " [facility] Provider Note   CSN: [id number] Arrival date & time: 02/14/24  2049     Patient presents with: Head Injury   Sierra Perez is a 64 y.o. female.    Head Injury    64 year old female with medical history significant for DVT on Eliquis  presenting to the emergency department after a head injury.  Patient recently traveled from [place] .  She stood up quickly from bending over and hit her head on a [place] countertop roughly 1 hour prior to arrival.  She denies loss of consciousness.  She last took her Eliquis  this morning.  She denies any nausea, vomiting, blurred vision.  She denies any focal neurologic deficits.  She denies any other injuries or complaints.  She arrives GCS 15, ABC intact.  Prior to Admission medications  Medication Sig Start Date End Date Taking? Authorizing Provider  acetaminophen  (TYLENOL ) 500 MG tablet Take 1,000 mg by mouth every 6 (six) hours as needed for moderate pain.    [provider]  albuterol  (VENTOLIN  HFA) 108 (90 Base) MCG/ACT inhaler Inhale 1 puff into the lungs every 6 (six) hours as needed for wheezing or shortness of breath. 01/18/19   [provider]  anastrozole  (ARIMIDEX ) 1 MG tablet TAKE 1 TABLET(1 MG) BY MOUTH DAILY 03/15/23   Odean Potts, MD  Bacillus Coagulans-Inulin (PROBIOTIC) 1-250 BILLION-MG CAPS Take 250 mg by mouth daily. 05/27/20   [provider]  CALCIUM  CITRATE PO Take 3 tablets by mouth daily.    [provider]  Cholecalciferol  (VITAMIN D -3) 25 MCG (1000 UT) CAPS Take 1,000 Units by mouth daily.    [provider]  DULoxetine  (CYMBALTA ) 60 MG capsule Take 60 mg by mouth daily.    [provider]  ELIQUIS  5 MG TABS tablet TAKE 1 TABLET(5 MG) BY MOUTH TWICE DAILY 12/20/23   Gudena, Vinay, MD  fluticasone  (FLONASE ) 50 MCG/ACT nasal spray Place 2 sprays into both nostrils daily.    [provider]  gabapentin  (NEURONTIN )  600 MG tablet TAKE 1 TABLET(600 MG) BY MOUTH THREE TIMES DAILY 06/21/23   Gretta Bertrum ORN, PA-C  ipratropium (ATROVENT ) 0.03 % nasal spray Place 2 sprays into both nostrils 2 (two) times daily. 05/21/19   [provider]  levothyroxine  (SYNTHROID ) 50 MCG tablet Take 50 mcg by mouth every morning. 05/18/19   [provider]  OZEMPIC, 2 MG/DOSE, 8 MG/3ML SOPN Inject 2 mg into the skin once a week. Patient not taking: Reported on 12/20/2023    [provider]  pantoprazole  (PROTONIX ) 40 MG tablet Take 1 tablet (40 mg total) by mouth daily. 10/21/22   Meng, Hao, PA  rosuvastatin  (CRESTOR ) 5 MG tablet TAKE 1 TABLET BY MOUTH EVERY DAY 04/23/23   Bensimhon, Toribio SAUNDERS, MD  valACYclovir  (VALTREX ) 500 MG tablet Take 1 tablet (500 mg total) by mouth daily. Increase to bid x 3 days with symptoms 11/10/23   Lo, Arland POUR, CNM    Allergies: Succinylcholine, Azithromycin, Keflex  [cephalexin ], Silver, Sulfa antibiotics, and Sulfasalazine    Review of Systems  All other systems reviewed and are negative.   Updated Vital Signs BP (!) 142/73   Pulse 68   Temp 98.4 F (36.9 C) (Oral)   Resp 18   Ht 5' 7.5 (1.715 m)   Wt 65.8 kg   SpO2 100%   BMI 22.38 kg/m   Physical Exam Vitals and nursing note reviewed.  Constitutional:      General: She  is not in acute distress. HENT:     Head: Normocephalic and atraumatic.  Eyes:     Conjunctiva/sclera: Conjunctivae normal.     Pupils: Pupils are equal, round, and reactive to light.  Cardiovascular:     Rate and Rhythm: Normal rate and regular rhythm.  Pulmonary:     Effort: Pulmonary effort is normal. No respiratory distress.  Abdominal:     General: There is no distension.     Tenderness: There is no guarding.  Musculoskeletal:        General: No deformity or signs of injury.     Cervical back: Normal range of motion and neck supple. No rigidity or tenderness.  Skin:    Findings: No lesion or rash.  Neurological:     General:  No focal deficit present.     Mental Status: She is alert. Mental status is at baseline.     Cranial Nerves: No cranial nerve deficit.     Sensory: No sensory deficit.     Motor: No weakness.     (all labs ordered are listed, but only abnormal results are displayed) Labs Reviewed - No data to display  EKG: None  Radiology: CT Head Wo Contrast Result Date: 02/14/2024 CLINICAL DATA:  Hit head, anticoagulated EXAM: CT HEAD WITHOUT CONTRAST TECHNIQUE: Contiguous axial images were obtained from the base of the skull through the vertex without intravenous contrast. RADIATION DOSE REDUCTION: This exam was performed according to the departmental dose-optimization program which includes automated exposure control, adjustment of the mA and/or kV according to patient size and/or use of iterative reconstruction technique. COMPARISON:  09/18/2022 FINDINGS: Brain: No acute infarct or hemorrhage. Lateral ventricles and midline structures are unremarkable. No acute extra-axial fluid collections. No mass effect. Vascular: No hyperdense vessel or unexpected calcification. Skull: Normal. Negative for fracture or focal lesion. Sinuses/Orbits: No acute finding. Other: None. IMPRESSION: 1. No acute intracranial process. Electronically Signed   By: Ozell Daring M.D.   On: 02/14/2024 22:27     Procedures   Medications Ordered in the ED - No data to display                                  Medical Decision Making   64 year old female with medical history significant for DVT on Eliquis  presenting to the emergency department after a head injury.  Patient recently traveled from [place] .  She stood up quickly from bending over and hit her head on a [place] countertop roughly 1 hour prior to arrival.  She denies loss of consciousness.  She last took her Eliquis  this morning.  She denies any nausea, vomiting, blurred vision.  She denies any focal neurologic deficits.  She denies any other injuries or complaints.  She  arrives GCS 15, ABC intact.  On arrival, the patient was vitally stable.  C-spine cleared by Nexus criteria, given patient Eliquis  use, will obtain CT imaging of the head to rule out intracranial bleed.  CT head: No acute intracranial abnormality.  Patient feeling overall well-appearing, neurologically intact, no other signs of trauma, overall stable for discharge.     Final diagnoses:  Minor head injury, initial encounter    ED Discharge Orders     None          Jerrol Agent, MD 02/14/24 2329  "

## 2024-02-14 NOTE — ED Triage Notes (Signed)
 Pt reports standing up quickly from bent over, hit head on granite counter top appx 1 hr pta.  Pt takes eliquis . Denies loc, nausea, blurred vision.   AOx4

## 2024-03-13 ENCOUNTER — Ambulatory Visit: Payer: Self-pay | Attending: Hematology and Oncology

## 2024-09-11 ENCOUNTER — Ambulatory Visit (HOSPITAL_BASED_OUTPATIENT_CLINIC_OR_DEPARTMENT_OTHER): Admitting: Obstetrics & Gynecology

## 2024-09-19 ENCOUNTER — Ambulatory Visit: Admitting: Plastic Surgery

## 2024-12-19 ENCOUNTER — Inpatient Hospital Stay: Admitting: Hematology and Oncology

## 2024-12-19 ENCOUNTER — Inpatient Hospital Stay
